# Patient Record
Sex: Male | Born: 1940
Health system: Southern US, Community
[De-identification: ages and names within clinical notes are randomized; demographics above are authoritative.]

## PROBLEM LIST (undated history)

## (undated) DIAGNOSIS — I480 Paroxysmal atrial fibrillation: Secondary | ICD-10-CM

## (undated) DIAGNOSIS — R0989 Other specified symptoms and signs involving the circulatory and respiratory systems: Secondary | ICD-10-CM

## (undated) DIAGNOSIS — R059 Cough, unspecified: Secondary | ICD-10-CM

## (undated) DIAGNOSIS — K219 Gastro-esophageal reflux disease without esophagitis: Secondary | ICD-10-CM

## (undated) DIAGNOSIS — Z95810 Presence of automatic (implantable) cardiac defibrillator: Secondary | ICD-10-CM

## (undated) DIAGNOSIS — I5022 Chronic systolic (congestive) heart failure: Secondary | ICD-10-CM

## (undated) DIAGNOSIS — R05 Cough: Secondary | ICD-10-CM

## (undated) DIAGNOSIS — I5020 Unspecified systolic (congestive) heart failure: Secondary | ICD-10-CM

## (undated) DIAGNOSIS — H3530 Unspecified macular degeneration: Secondary | ICD-10-CM

## (undated) DIAGNOSIS — I513 Intracardiac thrombosis, not elsewhere classified: Secondary | ICD-10-CM

## (undated) DIAGNOSIS — I2510 Atherosclerotic heart disease of native coronary artery without angina pectoris: Secondary | ICD-10-CM

## (undated) DIAGNOSIS — E785 Hyperlipidemia, unspecified: Secondary | ICD-10-CM

## (undated) DIAGNOSIS — D649 Anemia, unspecified: Secondary | ICD-10-CM

## (undated) DIAGNOSIS — C187 Malignant neoplasm of sigmoid colon: Secondary | ICD-10-CM

## (undated) DIAGNOSIS — I10 Essential (primary) hypertension: Secondary | ICD-10-CM

## (undated) DIAGNOSIS — I219 Acute myocardial infarction, unspecified: Secondary | ICD-10-CM

## (undated) DIAGNOSIS — M1990 Unspecified osteoarthritis, unspecified site: Secondary | ICD-10-CM

## (undated) DIAGNOSIS — I255 Ischemic cardiomyopathy: Secondary | ICD-10-CM

## (undated) HISTORY — DX: Chronic systolic (congestive) heart failure: I50.22

## (undated) HISTORY — DX: Unspecified macular degeneration: H35.30

## (undated) HISTORY — DX: Paroxysmal atrial fibrillation: I48.0

## (undated) HISTORY — PX: COLON SURGERY: SHX602

## (undated) HISTORY — DX: Other specified symptoms and signs involving the circulatory and respiratory systems: R09.89

## (undated) HISTORY — DX: Gastro-esophageal reflux disease without esophagitis: K21.9

## (undated) HISTORY — PX: INGUINAL HERNIA REPAIR: SUR1180

## (undated) HISTORY — DX: Anemia, unspecified: D64.9

## (undated) HISTORY — DX: Hyperlipidemia, unspecified: E78.5

## (undated) HISTORY — PX: CORONARY ANGIOPLASTY: SHX604

## (undated) HISTORY — DX: Acute myocardial infarction, unspecified: I21.9

## (undated) HISTORY — PX: TONSILLECTOMY: SUR1361

## (undated) HISTORY — DX: Unspecified osteoarthritis, unspecified site: M19.90

## (undated) HISTORY — PX: CATARACT EXTRACTION W/ INTRAOCULAR LENS  IMPLANT, BILATERAL: SHX1307

## (undated) HISTORY — DX: Cough, unspecified: R05.9

## (undated) HISTORY — DX: Atherosclerotic heart disease of native coronary artery without angina pectoris: I25.10

## (undated) HISTORY — DX: Ischemic cardiomyopathy: I25.5

## (undated) HISTORY — DX: Intracardiac thrombosis, not elsewhere classified: I51.3

## (undated) HISTORY — DX: Cough: R05

## (undated) HISTORY — DX: Essential (primary) hypertension: I10

---

## 1999-05-13 ENCOUNTER — Ambulatory Visit (HOSPITAL_BASED_OUTPATIENT_CLINIC_OR_DEPARTMENT_OTHER): Admission: RE | Admit: 1999-05-13 | Discharge: 1999-05-14 | Payer: Self-pay | Admitting: Surgery

## 1999-05-13 ENCOUNTER — Encounter (INDEPENDENT_AMBULATORY_CARE_PROVIDER_SITE_OTHER): Payer: Self-pay | Admitting: Specialist

## 1999-05-15 ENCOUNTER — Encounter: Payer: Self-pay | Admitting: Surgery

## 1999-05-15 ENCOUNTER — Encounter: Admission: RE | Admit: 1999-05-15 | Discharge: 1999-05-15 | Payer: Self-pay | Admitting: Surgery

## 2001-03-30 DIAGNOSIS — I2510 Atherosclerotic heart disease of native coronary artery without angina pectoris: Secondary | ICD-10-CM

## 2001-03-30 HISTORY — DX: Atherosclerotic heart disease of native coronary artery without angina pectoris: I25.10

## 2002-01-28 HISTORY — PX: CORONARY ARTERY BYPASS GRAFT: SHX141

## 2002-02-21 ENCOUNTER — Encounter: Payer: Self-pay | Admitting: Surgery

## 2002-02-21 ENCOUNTER — Inpatient Hospital Stay (HOSPITAL_COMMUNITY): Admission: EM | Admit: 2002-02-21 | Discharge: 2002-02-26 | Payer: Self-pay

## 2002-02-21 ENCOUNTER — Encounter: Payer: Self-pay | Admitting: *Deleted

## 2002-02-22 ENCOUNTER — Encounter: Payer: Self-pay | Admitting: Surgery

## 2002-02-23 ENCOUNTER — Encounter: Payer: Self-pay | Admitting: Surgery

## 2002-02-24 ENCOUNTER — Encounter: Payer: Self-pay | Admitting: Cardiothoracic Surgery

## 2003-01-17 ENCOUNTER — Encounter: Payer: Self-pay | Admitting: Internal Medicine

## 2003-01-17 ENCOUNTER — Ambulatory Visit (HOSPITAL_COMMUNITY): Admission: RE | Admit: 2003-01-17 | Discharge: 2003-01-18 | Payer: Self-pay | Admitting: Internal Medicine

## 2003-01-17 DIAGNOSIS — Z95810 Presence of automatic (implantable) cardiac defibrillator: Secondary | ICD-10-CM

## 2003-01-17 HISTORY — PX: CARDIAC DEFIBRILLATOR PLACEMENT: SHX171

## 2003-01-17 HISTORY — DX: Presence of automatic (implantable) cardiac defibrillator: Z95.810

## 2003-01-18 ENCOUNTER — Encounter: Payer: Self-pay | Admitting: Internal Medicine

## 2004-03-10 ENCOUNTER — Ambulatory Visit: Payer: Self-pay | Admitting: Cardiology

## 2004-03-12 ENCOUNTER — Ambulatory Visit: Payer: Self-pay | Admitting: Internal Medicine

## 2004-08-20 ENCOUNTER — Ambulatory Visit: Payer: Self-pay | Admitting: Internal Medicine

## 2004-12-22 ENCOUNTER — Ambulatory Visit: Payer: Self-pay | Admitting: Cardiology

## 2005-02-09 ENCOUNTER — Ambulatory Visit: Payer: Self-pay | Admitting: Cardiology

## 2005-04-20 ENCOUNTER — Ambulatory Visit: Payer: Self-pay | Admitting: Internal Medicine

## 2005-08-26 ENCOUNTER — Ambulatory Visit: Payer: Self-pay | Admitting: Internal Medicine

## 2005-10-05 ENCOUNTER — Ambulatory Visit: Payer: Self-pay | Admitting: Cardiology

## 2006-01-29 ENCOUNTER — Ambulatory Visit: Payer: Self-pay

## 2006-02-22 ENCOUNTER — Ambulatory Visit: Payer: Self-pay | Admitting: Cardiology

## 2006-05-14 ENCOUNTER — Ambulatory Visit: Payer: Self-pay | Admitting: Internal Medicine

## 2006-07-30 ENCOUNTER — Ambulatory Visit: Payer: Self-pay | Admitting: Cardiology

## 2006-07-30 LAB — CONVERTED CEMR LAB
ALT: 12 units/L (ref 0–40)
AST: 17 units/L (ref 0–37)
Albumin: 3.4 g/dL — ABNORMAL LOW (ref 3.5–5.2)
Alkaline Phosphatase: 40 units/L (ref 39–117)
Bilirubin, Direct: 0.1 mg/dL (ref 0.0–0.3)
Cholesterol: 155 mg/dL (ref 0–200)
HDL: 44.5 mg/dL (ref >39.0)
LDL Cholesterol: 84 mg/dL (ref 0–99)
Total Bilirubin: 0.8 mg/dL (ref 0.3–1.2)
Total CHOL/HDL Ratio: 3.5
Total Protein: 5.9 g/dL — ABNORMAL LOW (ref 6.0–8.3)
Triglycerides: 134 mg/dL (ref 0–149)
VLDL: 27 mg/dL (ref 0–40)

## 2006-08-02 ENCOUNTER — Ambulatory Visit: Payer: Self-pay | Admitting: Internal Medicine

## 2006-09-02 ENCOUNTER — Encounter: Payer: Self-pay | Admitting: Internal Medicine

## 2006-09-02 ENCOUNTER — Ambulatory Visit: Payer: Self-pay | Admitting: Cardiology

## 2006-09-02 LAB — CONVERTED CEMR LAB
BUN: 8 mg/dL (ref 6–23)
CO2: 28 meq/L (ref 19–32)
Calcium: 9 mg/dL (ref 8.4–10.5)
Chloride: 111 meq/L (ref 96–112)
Creatinine, Ser: 0.8 mg/dL (ref 0.4–1.5)
GFR calc Af Amer: 125 mL/min
GFR calc non Af Amer: 103 mL/min
Glucose, Bld: 86 mg/dL (ref 70–99)
Potassium: 4.2 meq/L (ref 3.5–5.1)
Sodium: 143 meq/L (ref 135–145)

## 2006-09-10 ENCOUNTER — Ambulatory Visit: Payer: Self-pay

## 2006-09-10 LAB — CONVERTED CEMR LAB
BUN: 14 mg/dL (ref 6–23)
CO2: 31 meq/L (ref 19–32)
Calcium: 9.5 mg/dL (ref 8.4–10.5)
Chloride: 109 meq/L (ref 96–112)
Creatinine, Ser: 1 mg/dL (ref 0.4–1.5)
GFR calc Af Amer: 96 mL/min
GFR calc non Af Amer: 80 mL/min
Glucose, Bld: 135 mg/dL — ABNORMAL HIGH (ref 70–99)
Potassium: 5.5 meq/L — ABNORMAL HIGH (ref 3.5–5.1)
Sodium: 143 meq/L (ref 135–145)

## 2006-09-24 ENCOUNTER — Ambulatory Visit: Payer: Self-pay | Admitting: Cardiology

## 2006-09-24 LAB — CONVERTED CEMR LAB
BUN: 10 mg/dL (ref 6–23)
CO2: 32 meq/L (ref 19–32)
Calcium: 9.2 mg/dL (ref 8.4–10.5)
Chloride: 111 meq/L (ref 96–112)
Creatinine, Ser: 1 mg/dL (ref 0.4–1.5)
GFR calc Af Amer: 96 mL/min
GFR calc non Af Amer: 80 mL/min
Glucose, Bld: 89 mg/dL (ref 70–99)
Potassium: 4.8 meq/L (ref 3.5–5.1)
Sodium: 144 meq/L (ref 135–145)

## 2006-10-29 ENCOUNTER — Ambulatory Visit: Payer: Self-pay | Admitting: Internal Medicine

## 2006-11-09 ENCOUNTER — Ambulatory Visit: Payer: Self-pay | Admitting: Internal Medicine

## 2006-12-10 ENCOUNTER — Ambulatory Visit: Payer: Self-pay | Admitting: Internal Medicine

## 2006-12-13 ENCOUNTER — Ambulatory Visit: Payer: Self-pay

## 2006-12-20 ENCOUNTER — Ambulatory Visit: Payer: Self-pay | Admitting: Internal Medicine

## 2006-12-20 LAB — CONVERTED CEMR LAB
ALT: 14 units/L (ref 0–53)
AST: 18 units/L (ref 0–37)
Albumin: 3.5 g/dL (ref 3.5–5.2)
Alkaline Phosphatase: 43 units/L (ref 39–117)
BUN: 8 mg/dL (ref 6–23)
Bilirubin, Direct: 0.1 mg/dL (ref 0.0–0.3)
CO2: 30 meq/L (ref 19–32)
Calcium: 8.8 mg/dL (ref 8.4–10.5)
Chloride: 110 meq/L (ref 96–112)
Cholesterol: 174 mg/dL (ref 0–200)
Creatinine, Ser: 0.9 mg/dL (ref 0.4–1.5)
GFR calc Af Amer: 109 mL/min
GFR calc non Af Amer: 90 mL/min
Glucose, Bld: 126 mg/dL — ABNORMAL HIGH (ref 70–99)
HDL: 45.9 mg/dL (ref >39.0)
LDL Cholesterol: 108 mg/dL — ABNORMAL HIGH (ref 0–99)
Potassium: 4 meq/L (ref 3.5–5.1)
Sodium: 144 meq/L (ref 135–145)
Total Bilirubin: 0.8 mg/dL (ref 0.3–1.2)
Total CHOL/HDL Ratio: 3.8
Total Protein: 6.3 g/dL (ref 6.0–8.3)
Triglycerides: 100 mg/dL (ref 0–149)
VLDL: 20 mg/dL (ref 0–40)

## 2007-02-28 ENCOUNTER — Ambulatory Visit: Payer: Self-pay | Admitting: Internal Medicine

## 2007-03-01 ENCOUNTER — Encounter: Payer: Self-pay | Admitting: Internal Medicine

## 2007-03-01 LAB — CONVERTED CEMR LAB
ALT: 12 units/L (ref 0–53)
AST: 18 units/L (ref 0–37)
Albumin: 4.3 g/dL (ref 3.5–5.2)
Alkaline Phosphatase: 46 units/L (ref 39–117)
BUN: 11 mg/dL (ref 6–23)
CO2: 20 meq/L (ref 19–32)
Calcium: 9.3 mg/dL (ref 8.4–10.5)
Chloride: 103 meq/L (ref 96–112)
Cholesterol: 220 mg/dL — ABNORMAL HIGH (ref 0–200)
Creatinine, Ser: 0.99 mg/dL (ref 0.40–1.50)
Glucose, Bld: 67 mg/dL — ABNORMAL LOW (ref 70–99)
HDL: 56 mg/dL (ref >39)
LDL Cholesterol: 132 mg/dL — ABNORMAL HIGH (ref 0–99)
Potassium: 4.4 meq/L (ref 3.5–5.3)
Sodium: 142 meq/L (ref 135–145)
Total Bilirubin: 0.6 mg/dL (ref 0.3–1.2)
Total CHOL/HDL Ratio: 3.9
Total Protein: 7 g/dL (ref 6.0–8.3)
Triglycerides: 159 mg/dL — ABNORMAL HIGH (ref <150)
VLDL: 32 mg/dL (ref 0–40)

## 2007-03-04 ENCOUNTER — Ambulatory Visit: Payer: Self-pay | Admitting: Internal Medicine

## 2007-03-11 ENCOUNTER — Ambulatory Visit: Payer: Self-pay | Admitting: Internal Medicine

## 2007-03-11 LAB — CONVERTED CEMR LAB
BUN: 10 mg/dL (ref 6–23)
CO2: 26 meq/L (ref 19–32)
Calcium: 8.4 mg/dL (ref 8.4–10.5)
Chloride: 103 meq/L (ref 96–112)
Creatinine, Ser: 0.95 mg/dL (ref 0.40–1.50)
Glucose, Bld: 204 mg/dL — ABNORMAL HIGH (ref 70–99)
Potassium: 4.6 meq/L (ref 3.5–5.3)
Sodium: 138 meq/L (ref 135–145)

## 2007-03-21 ENCOUNTER — Ambulatory Visit: Payer: Self-pay

## 2007-06-06 ENCOUNTER — Ambulatory Visit: Payer: Self-pay

## 2007-06-06 ENCOUNTER — Encounter: Payer: Self-pay | Admitting: Internal Medicine

## 2007-06-06 LAB — CONVERTED CEMR LAB
ALT: 11 units/L (ref 0–53)
AST: 20 units/L (ref 0–37)
Albumin: 4.2 g/dL (ref 3.5–5.2)
Alkaline Phosphatase: 49 units/L (ref 39–117)
BUN: 15 mg/dL (ref 6–23)
CO2: 25 meq/L (ref 19–32)
Calcium: 8.8 mg/dL (ref 8.4–10.5)
Chloride: 108 meq/L (ref 96–112)
Cholesterol: 169 mg/dL (ref 0–200)
Creatinine, Ser: 0.98 mg/dL (ref 0.40–1.50)
Glucose, Bld: 111 mg/dL — ABNORMAL HIGH (ref 70–99)
HDL: 55 mg/dL (ref >39)
LDL Cholesterol: 89 mg/dL (ref 0–99)
Potassium: 4.2 meq/L (ref 3.5–5.3)
Sodium: 143 meq/L (ref 135–145)
Total Bilirubin: 0.7 mg/dL (ref 0.3–1.2)
Total CHOL/HDL Ratio: 3.1
Total Protein: 6.6 g/dL (ref 6.0–8.3)
Triglycerides: 127 mg/dL (ref <150)
VLDL: 25 mg/dL (ref 0–40)

## 2007-06-13 ENCOUNTER — Ambulatory Visit: Payer: Self-pay | Admitting: Internal Medicine

## 2007-09-05 ENCOUNTER — Encounter: Payer: Self-pay | Admitting: Internal Medicine

## 2007-09-05 ENCOUNTER — Ambulatory Visit: Payer: Self-pay | Admitting: Internal Medicine

## 2007-09-05 LAB — CONVERTED CEMR LAB
ALT: 12 units/L (ref 0–53)
AST: 16 units/L (ref 0–37)
Albumin: 4 g/dL (ref 3.5–5.2)
Alkaline Phosphatase: 47 units/L (ref 39–117)
Bilirubin, Direct: 0.1 mg/dL (ref 0.0–0.3)
Cholesterol: 154 mg/dL (ref 0–200)
HDL: 57 mg/dL (ref >39)
Indirect Bilirubin: 0.6 mg/dL (ref 0.0–0.9)
LDL Cholesterol: 79 mg/dL (ref 0–99)
Total Bilirubin: 0.7 mg/dL (ref 0.3–1.2)
Total CHOL/HDL Ratio: 2.7
Total Protein: 6.6 g/dL (ref 6.0–8.3)
Triglycerides: 91 mg/dL (ref <150)
VLDL: 18 mg/dL (ref 0–40)

## 2007-10-28 ENCOUNTER — Ambulatory Visit: Payer: Self-pay | Admitting: Internal Medicine

## 2007-10-31 ENCOUNTER — Ambulatory Visit: Payer: Self-pay

## 2007-10-31 ENCOUNTER — Encounter: Payer: Self-pay | Admitting: Internal Medicine

## 2007-11-18 ENCOUNTER — Ambulatory Visit: Payer: Self-pay | Admitting: Cardiovascular Disease

## 2007-11-28 ENCOUNTER — Encounter: Payer: Self-pay | Admitting: Internal Medicine

## 2007-11-28 ENCOUNTER — Ambulatory Visit: Payer: Self-pay | Admitting: Internal Medicine

## 2007-11-28 LAB — CONVERTED CEMR LAB
HCT: 39 % (ref 39.0–52.0)
Hemoglobin: 12.9 g/dL — ABNORMAL LOW (ref 13.0–17.0)
MCHC: 33.1 g/dL (ref 30.0–36.0)
MCV: 91.1 fL (ref 78.0–100.0)
Platelets: 183 10*3/uL (ref 150–400)
RBC: 4.28 M/uL (ref 4.22–5.81)
RDW: 13.3 % (ref 11.5–15.5)
WBC: 5.6 10*3/uL (ref 4.0–10.5)

## 2007-12-02 ENCOUNTER — Ambulatory Visit: Payer: Self-pay | Admitting: Cardiology

## 2007-12-22 ENCOUNTER — Ambulatory Visit: Payer: Self-pay | Admitting: Cardiology

## 2008-02-08 ENCOUNTER — Ambulatory Visit: Payer: Self-pay

## 2008-02-08 ENCOUNTER — Encounter: Payer: Self-pay | Admitting: Internal Medicine

## 2008-03-19 ENCOUNTER — Ambulatory Visit: Payer: Self-pay | Admitting: Internal Medicine

## 2008-03-25 ENCOUNTER — Emergency Department (HOSPITAL_COMMUNITY): Admission: EM | Admit: 2008-03-25 | Discharge: 2008-03-25 | Payer: Self-pay | Admitting: Internal Medicine

## 2008-03-30 HISTORY — PX: CATARACT EXTRACTION W/PHACO: SHX586

## 2008-03-30 HISTORY — PX: ICD GENERATOR CHANGE: SHX5854

## 2008-05-07 ENCOUNTER — Ambulatory Visit: Payer: Self-pay | Admitting: Internal Medicine

## 2008-05-07 ENCOUNTER — Encounter: Payer: Self-pay | Admitting: Internal Medicine

## 2008-05-07 LAB — CONVERTED CEMR LAB
ALT: 10 units/L (ref 0–53)
AST: 17 units/L (ref 0–37)
Albumin: 3.8 g/dL (ref 3.5–5.2)
Alkaline Phosphatase: 50 units/L (ref 39–117)
BUN: 12 mg/dL (ref 6–23)
CO2: 21 meq/L (ref 19–32)
Calcium: 8.7 mg/dL (ref 8.4–10.5)
Chloride: 108 meq/L (ref 96–112)
Cholesterol: 142 mg/dL (ref 0–200)
Creatinine, Ser: 0.86 mg/dL (ref 0.40–1.50)
Glucose, Bld: 111 mg/dL — ABNORMAL HIGH (ref 70–99)
HDL: 54 mg/dL (ref >39)
LDL Cholesterol: 72 mg/dL (ref 0–99)
Potassium: 4.5 meq/L (ref 3.5–5.3)
Sodium: 140 meq/L (ref 135–145)
Total Bilirubin: 0.6 mg/dL (ref 0.3–1.2)
Total CHOL/HDL Ratio: 2.6
Total Protein: 6.1 g/dL (ref 6.0–8.3)
Triglycerides: 78 mg/dL (ref <150)
VLDL: 16 mg/dL (ref 0–40)

## 2008-06-18 ENCOUNTER — Ambulatory Visit: Payer: Self-pay | Admitting: Internal Medicine

## 2008-08-13 ENCOUNTER — Ambulatory Visit: Payer: Self-pay | Admitting: Internal Medicine

## 2008-08-13 ENCOUNTER — Encounter: Payer: Self-pay | Admitting: Internal Medicine

## 2008-08-22 LAB — CONVERTED CEMR LAB
ALT: 12 units/L (ref 0–53)
AST: 19 units/L (ref 0–37)
Albumin: 3.8 g/dL (ref 3.5–5.2)
Alkaline Phosphatase: 60 units/L (ref 39–117)
Bilirubin, Direct: 0.1 mg/dL (ref 0.0–0.3)
Cholesterol: 168 mg/dL (ref 0–200)
HDL: 60 mg/dL (ref >39)
Indirect Bilirubin: 0.5 mg/dL (ref 0.0–0.9)
LDL Cholesterol: 90 mg/dL (ref 0–99)
Total Bilirubin: 0.6 mg/dL (ref 0.3–1.2)
Total CHOL/HDL Ratio: 2.8
Total Protein: 6.8 g/dL (ref 6.0–8.3)
Triglycerides: 91 mg/dL (ref <150)
VLDL: 18 mg/dL (ref 0–40)

## 2008-11-26 ENCOUNTER — Ambulatory Visit: Payer: Self-pay | Admitting: Internal Medicine

## 2008-11-26 ENCOUNTER — Encounter: Payer: Self-pay | Admitting: Internal Medicine

## 2008-11-26 DIAGNOSIS — I5022 Chronic systolic (congestive) heart failure: Secondary | ICD-10-CM | POA: Insufficient documentation

## 2008-11-27 ENCOUNTER — Telehealth (INDEPENDENT_AMBULATORY_CARE_PROVIDER_SITE_OTHER): Payer: Self-pay | Admitting: *Deleted

## 2008-11-28 ENCOUNTER — Ambulatory Visit: Payer: Self-pay

## 2008-11-28 ENCOUNTER — Encounter: Payer: Self-pay | Admitting: Cardiology

## 2008-12-06 ENCOUNTER — Ambulatory Visit: Payer: Self-pay | Admitting: Internal Medicine

## 2008-12-06 DIAGNOSIS — E785 Hyperlipidemia, unspecified: Secondary | ICD-10-CM | POA: Insufficient documentation

## 2008-12-07 LAB — CONVERTED CEMR LAB
ALT: 11 units/L (ref 0–53)
AST: 17 units/L (ref 0–37)
Albumin: 3.9 g/dL (ref 3.5–5.2)
Alkaline Phosphatase: 49 units/L (ref 39–117)
BUN: 11 mg/dL (ref 6–23)
CO2: 24 meq/L (ref 19–32)
Calcium: 8.8 mg/dL (ref 8.4–10.5)
Chloride: 108 meq/L (ref 96–112)
Cholesterol: 160 mg/dL (ref 0–200)
Creatinine, Ser: 0.89 mg/dL (ref 0.40–1.50)
Glucose, Bld: 109 mg/dL — ABNORMAL HIGH (ref 70–99)
HCT: 34.3 % — ABNORMAL LOW (ref 39.0–52.0)
HDL: 54 mg/dL (ref >39)
Hemoglobin: 10.3 g/dL — ABNORMAL LOW (ref 13.0–17.0)
INR: 1 (ref 0.0–1.5)
LDL Cholesterol: 87 mg/dL (ref 0–99)
MCHC: 30 g/dL (ref 30.0–36.0)
MCV: 80 fL (ref 78.0–100.0)
Platelets: 215 10*3/uL (ref 150–400)
Potassium: 4.1 meq/L (ref 3.5–5.3)
Prothrombin Time: 13.3 s (ref 11.6–15.2)
RBC: 4.29 M/uL (ref 4.22–5.81)
RDW: 16 % — ABNORMAL HIGH (ref 11.5–15.5)
Sodium: 142 meq/L (ref 135–145)
Total Bilirubin: 0.6 mg/dL (ref 0.3–1.2)
Total CHOL/HDL Ratio: 3
Total Protein: 6.5 g/dL (ref 6.0–8.3)
Triglycerides: 94 mg/dL (ref <150)
VLDL: 19 mg/dL (ref 0–40)
WBC: 4.9 10*3/uL (ref 4.0–10.5)
aPTT: 28 s (ref 24–37)

## 2008-12-10 ENCOUNTER — Inpatient Hospital Stay (HOSPITAL_COMMUNITY): Admission: RE | Admit: 2008-12-10 | Discharge: 2008-12-12 | Payer: Self-pay | Admitting: Internal Medicine

## 2008-12-10 ENCOUNTER — Ambulatory Visit: Payer: Self-pay | Admitting: Internal Medicine

## 2008-12-11 ENCOUNTER — Encounter: Payer: Self-pay | Admitting: Internal Medicine

## 2008-12-13 ENCOUNTER — Telehealth (INDEPENDENT_AMBULATORY_CARE_PROVIDER_SITE_OTHER): Payer: Self-pay | Admitting: *Deleted

## 2008-12-13 ENCOUNTER — Encounter (INDEPENDENT_AMBULATORY_CARE_PROVIDER_SITE_OTHER): Payer: Self-pay | Admitting: *Deleted

## 2008-12-17 ENCOUNTER — Telehealth: Payer: Self-pay | Admitting: Internal Medicine

## 2008-12-17 ENCOUNTER — Telehealth (INDEPENDENT_AMBULATORY_CARE_PROVIDER_SITE_OTHER): Payer: Self-pay | Admitting: *Deleted

## 2008-12-24 ENCOUNTER — Ambulatory Visit: Payer: Self-pay

## 2008-12-24 ENCOUNTER — Encounter: Payer: Self-pay | Admitting: Internal Medicine

## 2008-12-31 ENCOUNTER — Telehealth: Payer: Self-pay | Admitting: Internal Medicine

## 2009-04-29 ENCOUNTER — Ambulatory Visit: Payer: Self-pay | Admitting: Internal Medicine

## 2009-04-29 DIAGNOSIS — E860 Dehydration: Secondary | ICD-10-CM | POA: Insufficient documentation

## 2009-04-29 DIAGNOSIS — I359 Nonrheumatic aortic valve disorder, unspecified: Secondary | ICD-10-CM | POA: Insufficient documentation

## 2009-05-09 ENCOUNTER — Telehealth: Payer: Self-pay | Admitting: Internal Medicine

## 2009-05-10 ENCOUNTER — Ambulatory Visit: Payer: Self-pay | Admitting: Internal Medicine

## 2009-05-10 ENCOUNTER — Telehealth: Payer: Self-pay | Admitting: Internal Medicine

## 2009-05-10 ENCOUNTER — Encounter: Payer: Self-pay | Admitting: Internal Medicine

## 2009-05-10 DIAGNOSIS — R252 Cramp and spasm: Secondary | ICD-10-CM | POA: Insufficient documentation

## 2009-05-11 LAB — CONVERTED CEMR LAB
BUN: 9 mg/dL (ref 6–23)
CO2: 24 meq/L (ref 19–32)
Calcium: 8.8 mg/dL (ref 8.4–10.5)
Chloride: 107 meq/L (ref 96–112)
Creatinine, Ser: 0.9 mg/dL (ref 0.40–1.50)
Glucose, Bld: 97 mg/dL (ref 70–99)
Potassium: 4.2 meq/L (ref 3.5–5.3)
Sodium: 140 meq/L (ref 135–145)

## 2009-05-13 ENCOUNTER — Telehealth: Payer: Self-pay | Admitting: Internal Medicine

## 2009-08-29 ENCOUNTER — Ambulatory Visit (HOSPITAL_COMMUNITY): Admission: RE | Admit: 2009-08-29 | Discharge: 2009-08-29 | Payer: Self-pay | Admitting: General Surgery

## 2009-10-04 ENCOUNTER — Ambulatory Visit: Payer: Self-pay | Admitting: Internal Medicine

## 2009-11-01 ENCOUNTER — Encounter: Admission: RE | Admit: 2009-11-01 | Discharge: 2009-11-01 | Payer: Self-pay | Admitting: General Surgery

## 2010-01-20 ENCOUNTER — Encounter: Payer: Self-pay | Admitting: Internal Medicine

## 2010-01-20 ENCOUNTER — Ambulatory Visit: Payer: Self-pay | Admitting: Internal Medicine

## 2010-01-27 ENCOUNTER — Telehealth: Payer: Self-pay | Admitting: Internal Medicine

## 2010-02-04 ENCOUNTER — Encounter: Payer: Self-pay | Admitting: Internal Medicine

## 2010-02-04 ENCOUNTER — Ambulatory Visit: Payer: Self-pay

## 2010-03-30 DIAGNOSIS — C187 Malignant neoplasm of sigmoid colon: Secondary | ICD-10-CM

## 2010-03-30 HISTORY — DX: Malignant neoplasm of sigmoid colon: C18.7

## 2010-04-29 NOTE — Progress Notes (Addendum)
Summary: MEDICATION PROBLEMS  Phone Note Call from Patient Call back at Home Phone [phone]   Caller: SELF Call For: Mark Clayton Summary of Call: PT CANNOT TOLERATE AVAPRO-COUGHING AND PAIN IN LEGS AND ARMS Initial call taken by: Harlon Flor,  January 27, 2010 9:21 AM  Follow-up for Phone Call        [facility] TCB Benedict Needy, RN  January 27, 2010 4:05 PM   Pt did not tolerate avapro.  Follow-up by: Benedict Needy, RN,  January 28, 2010 10:18 AM

## 2010-04-29 NOTE — Assessment & Plan Note (Signed)
Summary: F6M/PACER CHECK/AMD   Visit Type:  Follow-up Primary Provider:  Dr. Jonny Ruiz  CC:  no cardiac complaints.  History of Present Illness: [name] is seen in followup for ischemic heart disease. He is status post ICD implantation for primary prevention with 6949-lead. He has reached ERI.  He denies problems with chest pain. He does have chronic mild shortness of breath. There has been no peripheral edema.  Current Medications (verified): 1)  Furosemide 20 Mg Tabs (Furosemide) .... Take One Tablet By Mouth Daily. 2)  Carvedilol 25 Mg Tabs (Carvedilol) .... One Half By Mouth Two Times A Day 3)  Omeprazole 20 Mg Cpdr (Omeprazole) .... One By Mouth Daily 4)  Aspirin 81 Mg Tbec (Aspirin) .... One By Mouth Daily 5)  Cozaar 50 Mg Tabs (Losartan Potassium) .... Take 1 Tablet By Mouth Once A Day 6)  Magnesium Oxide 400 Mg Tabs (Magnesium Oxide) .... Take 1 Tablet As Needed 7)  Nitroglycerin 0.4 Mg Subl (Nitroglycerin) .... Place 1 Tablet Under Tongue As Directed 8)  Proamatine 2.5 Mg Tabs (Midodrine Hcl) .... Take 1 Tablet Once A Day As Needed 9)  Promethazine Hcl 25 Mg Tabs (Promethazine Hcl) .... Take 1 Tablet By Mouth Once A Day 10)  Zocor 80 Mg Tabs (Simvastatin) .... Take 1 Tablet By Mouth Once A Day  Allergies (verified): No Known Drug Allergies  Vital Signs:  Patient profile:   70 year old male Height:      70 inches Weight:      183 pounds BMI:     26.35 Pulse rate:   74 / minute Pulse rhythm:   regular BP sitting:   140 / 68  (right arm) Cuff size:   large  Vitals Entered By: Mercer Pod (November 26, 2008 10:26 AM)  Physical Exam  General:  Alert and oriented in no acute distress. HEENT exam no xanthelasma and normocephalic. Neck veins were flat; carotids brisk and full without bruits. No lymphadenopathy. Back without kyphosis. Lungs clear. Heart sounds regular without murmurs or gallops. PMI nondisplaced. Abdomen soft with active bowel sounds without midline  pulsation or hepatomegaly. Femoral pulses and distal pulses intact. Extremities were without clubbing cyanosis or edemaSkin warm and dry. Neurological exam grossly normal     ICD Specifications ICD Vendor:  Medtronic     ICD Model Number:  7232     ICD Serial Number:  [id number] ICD DOI:  01/17/2003     ICD Implanting MD:  Sherryl Manges, MD  Lead 1:    Location: RV     DOI: 01/17/2003     Model #: [id number]     Serial #: [id number]     Status: active  Indications::  ICM   ICD Follow Up ICD Dependent:  No      Episodes Coumadin:  No  Brady Parameters Mode VVI     Lower Rate Limit:  40      Tachy Zones VF:  188     VT1:  162     Impression & Recommendations:  Problem # 1:  IMPLANTABLE DEFIBRILLATOR  MDT (ICD-V45.02)  Mark Clayton ICD has reached ERI. He will need to undergo generator replacement. We have discussed potential benefits as well as potential risks including but not limited to infection and death. He understands these risks and is willing to proceed. As noted below he also has a 6949-lead in place. Because of that at the time of generator replacement we will place a new rate sensing lead.  He  will need a venogram to identify patency of the left-sided vein. Alternatively he may need a right-sided procedure. In the event that his left side is occluded I may well choose to do nothing based on the recent Med[employer] data which are a little bit less concerning than the  HRS data presented in May  Orders: Bi-V ICD (Bi-V ICD)  Problem # 2:  MECH COMP DUE AUTO IMPLANTABLE CARD DEFIB 6949 LEAD (ICD-996.04) as above  Problem # 3:  CONGESTIVE HEART FAILURE, SYSTOLIC, CHRONIC (ICD-428.0) Is currently stable on his current medications with resolution of his cough attributable to his ACE inhibitor  Problem # 4:  CARDIOMYOPATHY, ISCHEMIC S/P CABG EF 25% (ICD-414.8)  You will need pre-implantation Myoview scanning since it has been years since his anatomy was visualized.  Orders: Nuclear  Stress Test (Nuc Stress Test)

## 2010-04-29 NOTE — Assessment & Plan Note (Signed)
Summary: F/U 3 MONTHS   Visit Type:  Follow-up Primary Provider:  Dr. Jonny Ruiz   History of Present Illness: [name] is seen in followup for ischemic heart disease. He is status post ICD implantation for primary prevention with 6949-lead. He recently underwent device generator replacement with insertion of a new right ventricular pace sense lead.  He denies problems with chest pain. He does have chronic mild shortness of breath.  He feels great. There has been no peripheral edema.  his last echo from fall 2009 and was treated mild  aortic stenosis w mean gradient 10mm  Myoview 2010  EF:  33 %  Septal and anterior hypokinesis to akinesis.     Current Medications (verified): 1)  Furosemide 20 Mg Tabs (Furosemide) .... Take One Tablet By Mouth Daily. 2)  Carvedilol 6.25 Mg Tabs (Carvedilol) .... Take One Tablet By Mouth in The Am and 25mg  At Night 3)  Omeprazole 20 Mg Cpdr (Omeprazole) .... One By Mouth Daily 4)  Aspirin 81 Mg Tbec (Aspirin) .... One By Mouth Daily 5)  Nitroglycerin 0.4 Mg Subl (Nitroglycerin) .... Place 1 Tablet Under Tongue As Directed 6)  Promethazine Hcl 25 Mg Tabs (Promethazine Hcl) .... Take 1 Tablet By Mouth Once A Day As Needed 7)  Zocor 80 Mg Tabs (Simvastatin) .... Take 1 Tablet By Mouth Once A Day  Allergies (verified): 1)  ! Crestor  Past History:  Past Medical History: Last updated: 05/08/2008 1. Ischemic cardiomyopathy with;       a.     Prior bypass.       b.     Ejection fraction of 25%.   2. Orthostatic intolerance.   3. Cough, question related to lisinopril versus infection.   4. Systolic hypertension.   5. Dyslipidemia.   6. Status post implantable cardioverter-defibrillator implantation       with 6949 lead.   Vital Signs:  Patient profile:   70 year old male Height:      70 inches Weight:      178 pounds BMI:     25.63 Pulse rate:   60 / minute BP sitting:   132 / 62  (left arm) Cuff size:   regular  Vitals Entered By: Bishop Dublin, CMA (January 20, 2010 9:52 AM)  Physical Exam  General:  The patient was alert and oriented in no acute distress. HEENT Normal.  Neck veins were flat, carotids were brisk.  Lungs were clear.  Heart sounds were regular without murmurs or gallops.  Abdomen was soft with active bowel sounds. There is no clubbing cyanosis or edema. Skin Warm and dry     ICD Specifications Following MD:  Sherryl Manges, MD     ICD Vendor:  Medtronic     ICD Model Number:  D274VRC     ICD Serial Number:  [id number] ICD DOI:  12/10/2008     ICD Implanting MD:  Sherryl Manges, MD  Lead 1:    Location: RV     DOI: 01/17/2003     Model #: [id number]     Serial #: [id number]     Status: active Lead 2:    Location: RV     DOI: 12/10/2008     Model #: [id number]     Serial #: [id number]     Status: active  Indications::  ICM  Explantation Comments: 12/10/2008 Medtronic [id number] S explanted  ICD Follow Up Remote Check?  No Battery Voltage:  3.18 V     Charge Time:  8.9 seconds     Underlying rhythm:  SR ICD Dependent:  No       ICD Device Measurements Right Ventricle:  Amplitude: 20 mV, Impedance: 532 ohms, Threshold: 1.25 V at 0.4 msec Shock Impedance: 41/55 ohms   Episodes Coumadin:  No Shock:  0     ATP:  0     Nonsustained:  1     Ventricular Pacing:  <0.1%  Brady Parameters Mode VVI     Lower Rate Limit:  40      Tachy Zones VF:  200     VT:  OFF     VT1:  OFF     Next Cardiology Appt Due:  03/30/2010 Tech Comments:  No parameter changes.  [phone] lead stable.  Throracic impedance below threshold.  No Carelink @ this time.  ROV 3 months [facility]. Altha Harm, LPN  January 20, 2010 10:17 AM   Impression & Recommendations:  Problem # 1:  AORTIC STENOSIS, MILD (ICD-424.1)  Will repeat echo to assess valve His updated medication list for this problem includes:    Furosemide 20 Mg Tabs (Furosemide) .Marland Kitchen... Take one tablet by mouth daily.    Carvedilol 6.25 Mg Tabs (Carvedilol) .Marland Kitchen... Take one  tablet by mouth in the am and 25mg  at night    Nitroglycerin 0.4 Mg Subl (Nitroglycerin) .Marland Kitchen... Place 1 tablet under tongue as directed    Avapro 150 Mg Tabs (Irbesartan) .Marland Kitchen... Take one tablet by mouth daily  Orders: Echocardiogram (Echo)  Problem # 2:  HYPERLIPIDEMIA, MILD (ICD-272.4)  will decrease statin to 40 mg day His updated medication list for this problem includes:    Zocor 80 Mg Tabs (Simvastatin) .Marland Kitchen... Take 1/2  tablet by mouth once a day  His updated medication list for this problem includes:    Zocor 80 Mg Tabs (Simvastatin) .Marland Kitchen... Take 1/2  tablet by mouth once a day  Problem # 3:  CONGESTIVE HEART FAILURE, SYSTOLIC, CHRONIC (ICD-428.0) will try and add back ARB; has been intolerant of AcE in the past 2/2 cough and something else which does notrecall;  will check bmet in 2 weeks.  Cr 0.9 in feb His updated medication list for this problem includes:    Furosemide 20 Mg Tabs (Furosemide) .Marland Kitchen... Take one tablet by mouth daily.    Carvedilol 6.25 Mg Tabs (Carvedilol) .Marland Kitchen... Take one tablet by mouth in the am and 25mg  at night    Aspirin 81 Mg Tbec (Aspirin) ..... One by mouth daily    Nitroglycerin 0.4 Mg Subl (Nitroglycerin) .Marland Kitchen... Place 1 tablet under tongue as directed    Avapro 150 Mg Tabs (Irbesartan) .Marland Kitchen... Take one tablet by mouth daily  Problem # 4:  CARDIOMYOPATHY, ISCHEMIC S/P CABG EF 25% (ICD-414.8) sstble His updated medication list for this problem includes:    Furosemide 20 Mg Tabs (Furosemide) .Marland Kitchen... Take one tablet by mouth daily.    Carvedilol 6.25 Mg Tabs (Carvedilol) .Marland Kitchen... Take one tablet by mouth in the am and 25mg  at night    Aspirin 81 Mg Tbec (Aspirin) ..... One by mouth daily    Nitroglycerin 0.4 Mg Subl (Nitroglycerin) .Marland Kitchen... Place 1 tablet under tongue as directed    Avapro 150 Mg Tabs (Irbesartan) .Marland Kitchen... Take one tablet by mouth daily  Problem # 5:  IMPLANTABLE DEFIBRILLATOR  MDT (ICD-V45.02) Device parameters and data were reviewed and no changes were  made   Orders: EKG w/ Interpretation (93000)  Patient Instructions: 1)  Your physician has recommended you  make the following change in your medication: DECREASE simvastatin  take 1/2 tab daily. START avapro daily 2)  Your physician wants you to follow-up in:  3 months with Alisia Ferrari will receive a reminder letter in the mail two months in advance. If you don't receive a letter, please call our office to schedule the follow-up appointment. 3)  Your physician has requested that you have an echocardiogram.  Echocardiography is a painless test that uses sound waves to create images of your heart. It provides your doctor with information about the size and shape of your heart and how well your heart's chambers and valves are working.  This procedure takes approximately one hour. There are no restrictions for this procedure. 4)  Your physician recommends that you return for lab work in:2 1/2 weeks BMP Prescriptions: FUROSEMIDE 20 MG TABS (FUROSEMIDE) Take one tablet by mouth daily.  #90 x 3   Entered by:   Benedict Needy, RN   Authorized by:   Nathen May, MD, [facility]   Signed by:   Benedict Needy, RN on 01/20/2010   Method used:   Electronically to        [facility]* (mail-order)       [address]       [place], [place]  [address]       Ph: [phone]       Fax: [phone]   RxID:   [id number] CARVEDILOL 6.25 MG TABS (CARVEDILOL) Take one tablet by mouth in the am and 25mg  at night  #90 x 4   Entered by:   Benedict Needy, RN   Authorized by:   Nathen May, MD, [facility]   Signed by:   Benedict Needy, RN on 01/20/2010   Method used:   Electronically to        [facility]* (mail-order)       [address]       [place], [place]  [address]       Ph: [phone]       Fax: [phone]   RxID:   [id number]

## 2010-04-29 NOTE — Progress Notes (Signed)
Summary: Leg Cramps  Phone Note Call from Patient Call back at Home Phone [phone]   Caller: Patient Call For: RN Summary of Call: Patient is having cramps in his legs, wants to know what this can be from and what he can take for it. Please call patient to discuss further per patient. Initial call taken by: [place],  May 09, 2009 10:03 AM  Follow-up for Phone Call        per pt- legs and feet have been cramping every night to the point that it will wake him up.  Pt can not even put weight on his feet in the morning.  Instructed pt that this sounds like a PCP issue but given the fact that he is on lasix it could be that his K is low.  If pt is not able to be seen by Dr. Jonny Ruiz then we will draw BMET today.  Follow-up by: Charlena Cross, RN, BSN,  May 09, 2009 10:59 AM

## 2010-04-29 NOTE — Progress Notes (Signed)
Summary: Nuclear Pre-Procedure  Phone Note Outgoing Call   Call placed by: Milana Na, EMT-P,  November 27, 2008 2:40 PM Summary of Call: Reviewed information on Myoview Information Sheet (see scanned document for further details).  Spoke with patient.     Nuclear Med Background Indications for Stress Test: Evaluation for Ischemia, Surgical Clearance  Indications Comments: Pending Generator Replacement on 12/10/08  History: Abnormal EKG, CABG, Defibrillator, Echo, Heart Catheterization, Myocardial Infarction, Myocardial Perfusion Study  History Comments: '03 MI, Heart Cath, CABGx6  EF 25% '04 ECHO EF <25% 10/04 Defibrilator 06/08 MPS Abnormal  Symptoms: DOE    Nuclear Pre-Procedure Cardiac Risk Factors: Family History - CAD, Hypertension, Lipids Height (in): 70  Nuclear Med Study Referring MD:  [employer]

## 2010-04-29 NOTE — Progress Notes (Signed)
Summary: RX  Phone Note Refill Request Call back at Home Phone [phone] Message from:  Patient on January 27, 2010 9:19 AM  Refills Requested: Medication #1:  COREG 25 MG [facility]  Initial call taken by: Harlon Flor,  January 27, 2010 9:20 AM  Follow-up for Phone Call        notified pharmacy of refill for cavedilol.  Told patient he would receive in the mail soon. Follow-up by: Bishop Dublin, CMA,  January 27, 2010 12:13 PM

## 2010-04-29 NOTE — Procedures (Signed)
Summary: PACER/AMD   Current Medications (verified): 1)  Furosemide 20 Mg Tabs (Furosemide) .... Take One Tablet By Mouth Daily. 2)  Carvedilol 25 Mg Tabs (Carvedilol) .... Take 6.25mg  in The Morning and 25mg  At Night 3)  Omeprazole 20 Mg Cpdr (Omeprazole) .... One By Mouth Daily 4)  Aspirin 81 Mg Tbec (Aspirin) .... One By Mouth Daily 5)  Nitroglycerin 0.4 Mg Subl (Nitroglycerin) .... Place 1 Tablet Under Tongue As Directed 6)  Promethazine Hcl 25 Mg Tabs (Promethazine Hcl) .... Take 1 Tablet By Mouth Once A Day As Needed 7)  Zocor 80 Mg Tabs (Simvastatin) .... Take 1 Tablet By Mouth Once A Day  Allergies (verified): 1)  ! Crestor    ICD Specifications Following MD:  Sherryl Manges, MD     ICD Vendor:  [employer]tronic     ICD Model Number:  [id number]     ICD Serial Number:  [id number] ICD DOI:  12/10/2008     ICD Implanting MD:  Sherryl Manges, MD  Lead 1:    Location: RV     DOI: 01/17/2003     Model #: [id number]     Serial #: [id number]     Status: active Lead 2:    Location: RV     DOI: 12/10/2008     Model #: [id number]     Serial #: [id number]     Status: active  Indications::  ICM  Explantation Comments: 12/10/2008 Medtronic [id number] S explanted  ICD Follow Up Remote Check?  No Battery Voltage:  3.19 V     Charge Time:  8.7 seconds     Underlying rhythm:  SR ICD Dependent:  No       ICD Device Measurements Right Ventricle:  Amplitude: 20 mV, Impedance: 532 ohms, Threshold: 1.0 V at 0.4 msec Shock Impedance: 42/54 ohms   Episodes Coumadin:  No Shock:  0     ATP:  0     Nonsustained:  0     Ventricular Pacing:  <0.1%  Brady Parameters Mode VVI     Lower Rate Limit:  40      Tachy Zones VF:  200     VT:  OFF     VT1:  OFF     Next Cardiology Appt Due:  12/28/2009 Tech Comments:  No parameter changes.  Device function normal.  Optivol and thoracic impedance normal.  Patient activity level decreased early June due to hernia repair.  ROV 3 months with Dr. Graciela Husbands in [place]. Altha Harm, LPN  October 04, 3498 11:39 AM  Prescriptions: OMEPRAZOLE 20 MG CPDR (OMEPRAZOLE) one by mouth daily  #90 x 3   Entered by:   Altha Harm, LPN   Authorized by:   Nathen May, MD, [facility]   Signed by:   Nathen May, MD, [facility] on 10/04/2009   Method used:   Electronically to        [facility]  [address] [phone]* (retail)       [address]       [place], [place]  [address]       Ph: [phone] or [phone]       Fax: [phone]   RxID:   [id number] FUROSEMIDE 20 MG TABS (FUROSEMIDE) Take one tablet by mouth daily.  #90 x 3   Entered by:   Altha Harm, LPN   Authorized by:   Nathen May, MD, [facility]   Signed by:  Nathen May, MD, [facility] on 10/04/2009   Method used:   Electronically to        [facility]  [address] [phone]* (retail)       [address]       [place], [place]  [address]       Ph: [phone] or [phone]       Fax: [phone]   RxID:   [id number]

## 2010-04-29 NOTE — Miscellaneous (Signed)
Summary: crestor intolerance update  Clinical Lists Changes  Allergies: Added new allergy or adverse reaction of CRESTOR Observations: Added new observation of NKA: F (12/13/2008 15:46)

## 2010-04-29 NOTE — Cardiovascular Report (Signed)
Summary: Office Visit  Office Visit   Imported By: Roderic Ovens 01/22/2009 11:51:32  _____________________________________________________________________  External Attachment:    Type:   Image     Comment:   External Document

## 2010-04-29 NOTE — Assessment & Plan Note (Signed)
Summary: device check medtronic defib/LAB/sl   Visit Type:  Follow-up Primary Provider:  Dr. Jonny Ruiz  CC:  normal swelling and some sob when bend over.  History of Present Illness: [name] is seen in followup for ischemic heart disease. He is status post ICD implantation for primary prevention with 6949-lead. He recently underwent device generator replacement with insertion of a new right ventricular pace sense lead.  He denies problems with chest pain. He does have chronic mild shortness of breath. and notes that there is shortness of breath particularly when bending for a long period of time. There has been no peripheral edema.  his last echo from fall 2009 and was treated mild to moderate aortic stenosis  Current Problems (verified): 1)  Hyperlipidemia, Mild  (ICD-272.4) 2)  Implantable Defibrillator Mdt  (ICD-V45.02) 3)  Mech Comp Due Auto Implantable Card Defib [id number] Lead  (ICD-996.04) 4)  Congestive Heart Failure, Systolic, Chronic  (ICD-428.0) 5)  Cardiomyopathy, Ischemic S/p Cabg Ef 25%  (ICD-414.8)  Current Medications (verified): 1)  Furosemide 20 Mg Tabs (Furosemide) .... Take One Tablet By Mouth Daily. 2)  Carvedilol 25 Mg Tabs (Carvedilol) .... Take 6.25mg  in The Morning and 25mg  At Night 3)  Omeprazole 20 Mg Cpdr (Omeprazole) .... One By Mouth Daily 4)  Aspirin 81 Mg Tbec (Aspirin) .... One By Mouth Daily 5)  Magnesium Oxide 400 Mg Tabs (Magnesium Oxide) .... Take 1 Tablet As Needed 6)  Nitroglycerin 0.4 Mg Subl (Nitroglycerin) .... Place 1 Tablet Under Tongue As Directed 7)  Promethazine Hcl 25 Mg Tabs (Promethazine Hcl) .... Take 1 Tablet By Mouth Once A Day As Needed 8)  Zocor 80 Mg Tabs (Simvastatin) .... Take 1 Tablet By Mouth Once A Day  Allergies (verified): 1)  ! Crestor  Past History:  Past Medical History: Last updated: 05/08/2008 1. Ischemic cardiomyopathy with;       a.     Prior bypass.       b.     Ejection fraction of 25%.   2. Orthostatic  intolerance.   3. Cough, question related to lisinopril versus infection.   4. Systolic hypertension.   5. Dyslipidemia.   6. Status post implantable cardioverter-defibrillator implantation       with 6949 lead.   Past Surgical History: Last updated: 05/08/2008 1. Severe three-vessel coronary artery disease with  unstable angina and acute myocardial infarction. 2. Ischemic heart disease, prior myocardial  infarction, ejection fraction of less than 20% (Master study). 3. Medtronic ICD implantation on January 17, 2003.  Family History: Last updated: 05/08/2008  His father suffered a myocardial infarction at age 51. His  mother died at 49 of Alzheimer's disease.  Social History: Last updated: 05/08/2008 Full Time Married  Tobacco Use - No.  Alcohol Use - no  Vital Signs:  Patient profile:   70 year old male Height:      70 inches Weight:      183.75 pounds Pulse rate:   58 / minute Pulse rhythm:   regular BP sitting:   142 / 80  (left arm) Cuff size:   large  Vitals Entered By: Charlena Cross, RN, BSN (April 29, 2009 11:06 AM)  Physical Exam  General:  Well developed, well nourished, in no acute distress. Head:  HEENT normal Neck:  Neck supple, no JVD. No masses, thyromegaly or abnormal cervical nodes. Chest Wall:  device pocket well-healed Lungs:  clear to auscultation and percussion Heart:  regular rate and rhythm with a split S2-1-2 to  3/6 systolic murmur mid peaking heard in the left lower sternal border out to the apex Abdomen:  soft nontenderwithout hepatomegaly Msk:  Back normal, normal gait. Muscle strength and tone normal. Extremities:  No clubbing or cyanosis. Neurologic:  Alert and oriented x 3.grossly normal    ICD Specifications Following MD:  Sherryl Manges, MD     ICD Vendor:  Medtronic     ICD Model Number:  D274VRC     ICD Serial Number:  [id number] ICD DOI:  12/10/2008     ICD Implanting MD:  Sherryl Manges, MD  Lead 1:    Location: RV      DOI: 01/17/2003     Model #: [id number]     Serial #: [id number]     Status: active Lead 2:    Location: RV     DOI: 12/10/2008     Model #: [id number]     Serial #: [id number]     Status: active  Indications::  ICM  Explantation Comments: 12/10/2008 Medtronic 7564/[id number] S explanted  ICD Follow Up Remote Check?  No Battery Voltage:  3.21 V     Charge Time:  8.9 seconds     Underlying rhythm:  SR ICD Dependent:  No       ICD Device Measurements Right Ventricle:  Amplitude: 20 mV, Impedance: 532 ohms, Threshold: 1.0 V at 0.4 msec Shock Impedance: 38/49 ohms   Episodes Coumadin:  No Shock:  0     ATP:  0     Nonsustained:  2     Ventricular Pacing:  <0.1%  Brady Parameters Mode VVI     Lower Rate Limit:  40      Tachy Zones VF:  200     VT:  OFF     VT1:  OFF     Next Cardiology Appt Due:  06/28/2009 Tech Comments:  RV reprogrammed 2.0@0 .4.  Device function normal.  Site well healed.  ROV 3 months [facility]. Altha Harm, LPN  April 29, 2009 11:12 AM   Impression & Recommendations:  Problem # 1:  CONGESTIVE HEART FAILURE, SYSTOLIC, CHRONIC (ICD-428.0) his heart failure remains chronic. Consideration at his next visit we'll have to include the addition of Aldactone and ARB's;  he has been intolerant of lisinopril in the past. His updated medication list for this problem includes:    Furosemide 20 Mg Tabs (Furosemide) .Marland Kitchen... Take one tablet by mouth daily.    Carvedilol 25 Mg Tabs (Carvedilol) .Marland Kitchen... Take 6.25mg  in the morning and 25mg  at night    Aspirin 81 Mg Tbec (Aspirin) ..... One by mouth daily    Nitroglycerin 0.4 Mg Subl (Nitroglycerin) .Marland Kitchen... Place 1 tablet under tongue as directed  Problem # 2:  AORTIC STENOSIS, MILD (ICD-424.1) we'll plan an echo at his next visit to reassess aortic valve function and mitral valve function His updated medication list for this problem includes:    Furosemide 20 Mg Tabs (Furosemide) .Marland Kitchen... Take one tablet by mouth daily.    Carvedilol 25 Mg  Tabs (Carvedilol) .Marland Kitchen... Take 6.25mg  in the morning and 25mg  at night    Nitroglycerin 0.4 Mg Subl (Nitroglycerin) .Marland Kitchen... Place 1 tablet under tongue as directed  Problem # 3:  MECH COMP DUE AUTO IMPLANTABLE CARD DEFIB [id number] LEAD (ICD-996.04) this was replaced with a 5076 rate sense lead  Problem # 4:  IMPLANTABLE DEFIBRILLATOR  MDT (ICD-V45.02) Device parameters and data were reviewed and no changes were made

## 2010-04-29 NOTE — Progress Notes (Signed)
Summary: CALL BACK  Phone Note Call from Patient Call back at Home Phone [phone]   Caller: SELF Call For: KLEIN Summary of Call: PT IS WAITING FOR A CALL BACK ABOUT HIS POTASSIUM-HE THOUGHT THAT HE WOULD GET A CALL TODAY Initial call taken by: Harlon Flor,  May 10, 2009 3:34 PM  Follow-up for Phone Call        instructed pt that we would have results on Monday but that if anything was grossly   K level normal.  pts instructed that this is not the cause of his cramps. left message.  Follow-up by: Charlena Cross, RN, BSN,  May 13, 2009 8:31 AM

## 2010-04-29 NOTE — Progress Notes (Signed)
Summary: Mail Order Presc.  Phone Note Call from Patient Call back at Home Phone [phone]   Caller: Patient Summary of Call: Please call  to discuss mail in prescripton for prescription solutiouns mail order Initial call taken by: [place],  December 31, 2008 2:33 PM    Prescriptions: OMEPRAZOLE 20 MG CPDR (OMEPRAZOLE) one by mouth daily  #90 x 3   Entered by:   Mercer Pod   Authorized by:   Nathen May, MD, [facility]   Signed by:   Mercer Pod on 01/02/2009   Method used:   Faxed to ...       [facility] (retail)             , [place]         Ph:        Fax: [phone]   RxID:   [id number] OMEPRAZOLE 20 MG CPDR (OMEPRAZOLE) one by mouth daily  #90 x 3   Entered by:   Mercer Pod   Authorized by:   Nathen May, MD, [facility]   Signed by:   Mercer Pod on 01/02/2009   Method used:   Faxed to ...       [facility] (retail)             , [place]         Ph:        Fax: [phone]   RxID:   [id number] MAGNESIUM OXIDE 400 MG TABS (MAGNESIUM OXIDE) Take 1 tablet as needed  #0 x 0   Entered by:   Mercer Pod   Authorized by:   Nathen May, MD, [facility]   Signed by:   Mercer Pod on 01/02/2009   Method used:   Faxed to ...       [facility] (retail)             , [place]         Ph:        Fax: [phone]   RxID:   [id number] COREG 6.25 MG TABS (CARVEDILOL) 1 tab by mouth every morning  #90 x 3   Entered by:   Mercer Pod   Authorized by:   Nathen May, MD, [facility]   Signed by:   Mercer Pod on 01/02/2009   Method used:   Faxed to ...       [facility] ([facility]ail)             , [place]         Ph:        Fax: [phone]   RxID:   [id number] ZOCOR 80 MG TABS (SIMVASTATIN) Take 1 tablet by mouth once a day  #90 x 3   Entered by:   Mercer Pod   Authorized by:   Nathen May, MD, [facility]   Signed by:   Mercer Pod on 01/02/2009   Method used:   Faxed to ...      [facility]ail)             , [place]         Ph:        Fax: [phone]   RxID:   [id number] CARVEDILOL 25 MG TABS (CARVEDILOL) tab by mouth every evening  #90 x 3   Entered by:   Mercer Pod   Authorized by:   Nathen May, MD, [facility]   Signed by:   Mercer Pod on 01/02/2009   Method used:  Faxed to ...       [facility]ail)             , [place]         Ph:        Fax: [phone]   RxID:   [id number] FUROSEMIDE 20 MG TABS (FUROSEMIDE) Take one tablet by mouth daily.  #90 x 3   Entered by:   Mercer Pod   Authorized by:   Nathen May, MD, [facility]   Signed by:   Mercer Pod on 01/02/2009   Method used:   Faxed to ...       [facility] (retail)             , [place]         Ph:        Fax: [phone]   RxID:   [id number]

## 2010-04-29 NOTE — Letter (Signed)
Summary: Implantable Device Instructions  Architectural technologist at [address]   [place], [place] [address]   Phone: [phone]  Fax: [phone]      Implantable Device Instructions  You are scheduled for:  _____ Permanent Transvenous Pacemaker _____ Implantable Cardioverter Defibrillator _____ Implantable Loop Recorder ___x__ Generator Change  on 12/10/2008 @ 8:00am with Dr. Klein_____.  1.  Please arrive at the Short Stay Center at [facility] at 5:30am on the day of your procedure.  2.  Do not eat or drink the night before your procedure.  3.  Complete lab work on 12/06/2008 @ [facility].  4.  Do NOT take these medications prior to your procedure:  furosemide.    5.  Plan for an overnight stay.  Bring your insurance cards and a list of your medications.  6.  Wash your chest and neck with antibacterial soap (any brand) the evening before and the morning of your procedure.  Rinse well.  7.  Education material received:     Pacemaker _____           ICD _____           Arrhythmia _____  *If you have ANY questions after you get home, please call the office [phone].  *Every attempt is made to prevent procedures from being rescheduled.  Due to the nauture of Electrophysiology, rescheduling can happen.  The physician is always aware and directs the staff when this occurs.

## 2010-04-29 NOTE — Miscellaneous (Signed)
Summary: Device change out  Clinical Lists Changes  Observations: Added new observation of ICDLEADSTAT2: active (12/11/2008 8:51) Added new observation of ICDLEADSER2: [id number] (12/11/2008 8:51) Added new observation of ICDLEADMOD2: 5076  (12/11/2008 8:51) Added new observation of ICDLEADDOI2: 12/10/2008  (12/11/2008 8:51) Added new observation of ICDLEADLOC2: RV  (12/11/2008 8:51) Added new observation of ICD IMPL DTE: 12/10/2008  (12/11/2008 8:51) Added new observation of ICD SERL#: [id number] H  (12/11/2008 8:51) Added new observation of ICD MODL#: D274VRC  (12/11/2008 8:51) Added new observation of ICDEXPLCOMM: 12/10/2008 Medtronic 1308/[id number] S explanted  (12/11/2008 8:51) Added new observation of ICD MD: Sherryl Manges, MD  (12/11/2008 8:51)      ICD Specifications Following MD:  Sherryl Manges, MD     ICD Vendor:  Medtronic     ICD Model Number:  D274VRC     ICD Serial Number:  [id number] ICD DOI:  12/10/2008     ICD Implanting MD:  Sherryl Manges, MD  Lead 1:    Location: RV     DOI: 01/17/2003     Model #: 4010     Serial #: [id number]     Status: active Lead 2:    Location: RV     DOI: 12/10/2008     Model #: 0347     Serial #: [id number]     Status: active  Indications::  ICM  Explantation Comments: 12/10/2008 Medtronic 6433/[id number] S explanted  ICD Specs ICD Dependent:  No      Episodes  Brady Parameters  Tachy Zones

## 2010-04-29 NOTE — Miscellaneous (Signed)
Summary: Device preload  Clinical Lists Changes  Observations: Added new observation of ICD INDICATN: ICM (05/07/2008 11:38) Added new observation of ICDLEADSTAT1: active (05/07/2008 11:38) Added new observation of ICDLEADSER1: [id number] V (05/07/2008 11:38) Added new observation of ICDLEADMOD1: 5284  (05/07/2008 11:38) Added new observation of ICDLEADDOI1: 01/17/2003  (05/07/2008 11:38) Added new observation of ICDLEADLOC1: RV  (05/07/2008 11:38) Added new observation of ICD IMP MD: Sherryl Manges, MD  (05/07/2008 11:38) Added new observation of ICD IMPL DTE: 01/17/2003  (05/07/2008 11:38) Added new observation of ICD SERL#: [id number] S  (05/07/2008 11:38) Added new observation of ICD MODL#: 7232  (05/07/2008 11:38) Added new observation of ICDMANUFACTR: Medtronic  (05/07/2008 11:38) Added new observation of CARDIO MD: Sherryl Manges, MD  (05/07/2008 11:38)      PPM Specifications Following MD:  Sherryl Manges, MD       ICD Specifications Following MD: Sherryl Manges, MD      ICD Vendor: Medtronic     ICD Model Number: 7232     ICD Serial Number: [id number]  ICD DOI: 01/17/2003     ICD Implanting MD: Sherryl Manges, MD  Lead 1:    Location: RV     DOI: 01/17/2003     Model #: 3474     Serial #: [id number]     Status: active  Indications::  ICM

## 2010-04-29 NOTE — Progress Notes (Signed)
Summary: med questions  Phone Note Outgoing Call   Summary of Call: called pt to inquire about being able to afford crestor.  also need to verify coreg dose currently taking.  LMTC.  Initial call taken by: Charlena Cross, RN, BSN,  December 13, 2008 3:41 PM     Appended Document: med questions    Clinical Lists Changes  Medications: Changed medication from CARVEDILOL 25 MG TABS (CARVEDILOL) one half by mouth two times a day to CARVEDILOL 25 MG TABS (CARVEDILOL) tab by mouth every evening - Signed Added new medication of COREG 6.25 MG TABS (CARVEDILOL) 2 tab by mouth every morning Rx of CARVEDILOL 25 MG TABS (CARVEDILOL) tab by mouth every evening;  #30 x 6;  Signed;  Entered by: Charlena Cross, RN, BSN;  Authorized by: Nathen May, MD, [facility];  Method used: Electronically to [facility]  [address] [phone]*, [address] [place], [place], [place]  [address], Ph: [phone] or [phone], Fax: [phone]    Prescriptions: CARVEDILOL 25 MG TABS (CARVEDILOL) tab by mouth every evening  #30 x 6   Entered by:   Charlena Cross, RN, BSN   Authorized by:   Nathen May, MD, [facility]   Signed by:   Charlena Cross, RN, BSN on 12/13/2008   Method used:   Electronically to        [facility]  [address] [phone]* (retail)       [address]       [place], [place]  [address]       Ph: [phone] or [phone]       Fax: [phone]   RxID:   [id number]

## 2010-04-29 NOTE — Progress Notes (Signed)
Summary: LAB RESULTS  Phone Note Call from Patient Call back at Home Phone [phone]   Caller: SELF Call For: [facility] Summary of Call: PT CALLING ABOUT [phone] Initial call taken by: Harlon Flor,  May 13, 2009 10:24 AM  Follow-up for Phone Call        pt given results. Charlena Cross RN BSN

## 2010-04-29 NOTE — Procedures (Signed)
Summary: wound check/changeout   Current Medications (verified): 1)  Furosemide 20 Mg Tabs (Furosemide) .... Take One Tablet By Mouth Daily. 2)  Carvedilol 25 Mg Tabs (Carvedilol) .... Take 6.25mg  in The Morning and 25mg  At Night 3)  Omeprazole 20 Mg Cpdr (Omeprazole) .... One By Mouth Daily 4)  Aspirin 81 Mg Tbec (Aspirin) .... One By Mouth Daily 5)  Magnesium Oxide 400 Mg Tabs (Magnesium Oxide) .... Take 1 Tablet As Needed 6)  Nitroglycerin 0.4 Mg Subl (Nitroglycerin) .... Place 1 Tablet Under Tongue As Directed 7)  Promethazine Hcl 25 Mg Tabs (Promethazine Hcl) .... Take 1 Tablet By Mouth Once A Day 8)  Zocor 80 Mg Tabs (Simvastatin) .... Take 1 Tablet By Mouth Once A Day  Allergies (verified): 1)  ! Crestor   ICD Specifications Following MD:  Sherryl Manges, MD     ICD Vendor:  Medtronic     ICD Model Number:  D274VRC     ICD Serial Number:  [id number] ICD DOI:  12/10/2008     ICD Implanting MD:  Sherryl Manges, MD  Lead 1:    Location: RV     DOI: 01/17/2003     Model #: 5366     Serial #: [id number]     Status: active Lead 2:    Location: RV     DOI: 12/10/2008     Model #: 9563     Serial #: [id number]     Status: active  Indications::  ICM  Explantation Comments: 12/10/2008 Medtronic 1884/[id number] S explanted  ICD Follow Up Remote Check?  No Battery Voltage:  3.21 V     Charge Time:  8.9 seconds     Underlying rhythm:  SR WITH PVC'S ICD Dependent:  No       ICD Device Measurements Right Ventricle:  Amplitude: 20 mV, Impedance: 551 ohms, Threshold: 1.0 V at 0.4 msec Shock Impedance: 41/55 ohms   Episodes Coumadin:  No Shock:  0     ATP:  0     Nonsustained:  0     Ventricular Pacing:  <0.1%  Brady Parameters Mode VVI     Lower Rate Limit:  40      Tachy Zones VF:  200     VT:  OFF     VT1:  OFF     Next Cardiology Appt Due:  02/27/2009 Tech Comments:  Normal device function.  Wound check appt today.  Steri-strips already removed by patient.  Wound without redness or  swelling.  No changes made.  ROV 3 months Dr Graciela Husbands in [facility] office. Gypsy Balsam RN BSN  December 24, 2008 9:39 AM

## 2010-04-29 NOTE — Assessment & Plan Note (Signed)
Summary: Cardiology Nuclear Study  Nuclear Med Background Indications for Stress Test: Evaluation for Ischemia, Surgical Clearance  Indications Comments: Pending Generator Replacement on 12/10/08 by Dr. Graciela Husbands  History: CABG, Defibrillator, Echo, Heart Catheterization, Myocardial Infarction, Myocardial Perfusion Study  History Comments: '03 M>CABGx6, EF 25%; '04 ECHO: EF= <25%; '04 AICD; '08 BMW:UXLKG antero-septal, apical infarct with mild peri-infarct ischemia anteriorly  Symptoms: Dizziness, DOE, Fatigue, Light-Headedness    Nuclear Pre-Procedure Cardiac Risk Factors: Family History - CAD, Hypertension, Lipids Caffeine/Decaff Intake: none NPO After: 8:30 PM Lungs: Clear IV 0.9% NS with Angio Cath: 22g     IV Site: (L) AC IV Started by: Irean Hong RN Chest Size (in) 42     Height (in): 70 Weight (lb): 180 BMI: 25.92  Nuclear Med Study 1 or 2 day study:  1 day     Stress Test Type:  Adenosine Reading MD:  Marca Ancona, MD     Referring MD:  Berton Mount, MD Resting Radionuclide:  Technetium 29m Tetrofosmin     Resting Radionuclide Dose:  11 mCi  Stress Radionuclide:  Technetium 61m Tetrofosmin     Stress Radionuclide Dose:  33 mCi   Stress Protocol      Max HR:  66 bpm Max Systolic BP: 165 mm HgRate Pressure Product:  40102 Dose of Adenosine:  45.8 mg    Stress Test Technologist:  Rea [employer] CMA-N     Nuclear Technologist:  Domenic Polite CNMT  Rest Procedure  Myocardial perfusion imaging was performed at rest 45 minutes following the intraveneous administration of Myoview Technetium 60m Tetrofosmin.  Stress Procedure  The patient received IV adenosine at 140 mcg/kg/min for 4 minutes. There were no significant changes with infusion, only occasional PVC's. Myoview was injected at the 2 minute mark and quantitative spect images were obtained after a 45 minute delay.  QPS Raw Data Images:  Normal; no motion artifact; normal heart/lung ratio. Stress Images:  Decreased  radiotracer counts in the basal to mid anterior wall, the anteroseptal wall, and the basal to mid inferoseptal wall.  Rest Images:  Similar to stress images.  Subtraction (SDS):  Primarily fixed defect involving the anteroseptal wall and the basal to mid anterior and inferoseptal walls.  Transient Ischemic Dilatation:  .97  (Normal <1.22)  Lung/Heart Ratio:  .38  (Normal <0.45)  Quantitative Gated Spect Images QGS EDV:  246 ml QGS ESV:  165 ml QGS EF:  33 % QGS cine images:  Septal and anterior hypokinesis to akinesis.    Overall Impression  Exercise Capacity: Adenosine study with no exercise. BP Response: Hypertensive blood pressure response. Clinical Symptoms: Warmth. ECG Impression: Baseline LVH with repolarization abnormality, no change with infusion.  Overall Impression: Large, primarily fixed defect involving the anterior wall and the septum.  Moderately decreased systolic function.   Appended Document: Cardiology Nuclear Study pt aware of results Cala Bradford :)

## 2010-05-30 ENCOUNTER — Other Ambulatory Visit (INDEPENDENT_AMBULATORY_CARE_PROVIDER_SITE_OTHER): Payer: Medicare Other

## 2010-05-30 ENCOUNTER — Encounter: Payer: Self-pay | Admitting: Internal Medicine

## 2010-05-30 ENCOUNTER — Encounter (INDEPENDENT_AMBULATORY_CARE_PROVIDER_SITE_OTHER): Payer: Medicare Other

## 2010-05-30 DIAGNOSIS — Z79899 Other long term (current) drug therapy: Secondary | ICD-10-CM

## 2010-05-30 DIAGNOSIS — I428 Other cardiomyopathies: Secondary | ICD-10-CM

## 2010-05-30 DIAGNOSIS — E785 Hyperlipidemia, unspecified: Secondary | ICD-10-CM

## 2010-06-05 LAB — CONVERTED CEMR LAB
ALT: 11 units/L (ref 0–53)
AST: 16 units/L (ref 0–37)
Albumin: 4.1 g/dL (ref 3.5–5.2)
Alkaline Phosphatase: 60 units/L (ref 39–117)
BUN: 13 mg/dL (ref 6–23)
Bilirubin, Direct: 0.1 mg/dL (ref 0.0–0.3)
CO2: 26 meq/L (ref 19–32)
Calcium: 8.9 mg/dL (ref 8.4–10.5)
Chloride: 106 meq/L (ref 96–112)
Cholesterol: 215 mg/dL — ABNORMAL HIGH (ref 0–200)
Creatinine, Ser: 0.97 mg/dL (ref 0.40–1.50)
Glucose, Bld: 115 mg/dL — ABNORMAL HIGH (ref 70–99)
HDL: 44 mg/dL (ref >39)
Indirect Bilirubin: 0.4 mg/dL (ref 0.0–0.9)
LDL Cholesterol: 146 mg/dL — ABNORMAL HIGH (ref 0–99)
Potassium: 4.3 meq/L (ref 3.5–5.3)
Sodium: 142 meq/L (ref 135–145)
Total Bilirubin: 0.5 mg/dL (ref 0.3–1.2)
Total CHOL/HDL Ratio: 4.9
Total Protein: 6.8 g/dL (ref 6.0–8.3)
Triglycerides: 126 mg/dL (ref <150)
VLDL: 25 mg/dL (ref 0–40)

## 2010-06-10 NOTE — Procedures (Signed)
Summary: 3 mth defib. check/sab   Current Medications (verified): 1)  Furosemide 20 Mg Tabs (Furosemide) .... Take One Tablet By Mouth Daily. 2)  Carvedilol 6.25 Mg Tabs (Carvedilol) .... Take One Tablet By Mouth in The Am and 25mg  At Night 3)  Omeprazole 20 Mg Cpdr (Omeprazole) .... One By Mouth Daily 4)  Aspirin 81 Mg Tbec (Aspirin) .... One By Mouth Daily 5)  Nitroglycerin 0.4 Mg Subl (Nitroglycerin) .... Place 1 Tablet Under Tongue As Directed 6)  Zocor 80 Mg Tabs (Simvastatin) .... Take 1/2  Tablet By Mouth Once A Day  Allergies (verified): 1)  ! Crestor   ICD Specifications Following MD:  Sherryl Manges, MD     ICD Vendor:  Medtronic     ICD Model Number:  D274VRC     ICD Serial Number:  [id number] ICD DOI:  12/10/2008     ICD Implanting MD:  Sherryl Manges, MD  Lead 1:    Location: RV     DOI: 01/17/2003     Model #: 4098     Serial #: [id number]     Status: active Lead 2:    Location: RV     DOI: 12/10/2008     Model #: [id number]     Serial #: [id number]     Status: active  Indications::  ICM  Explantation Comments: 12/10/2008 Medtronic 6295/[id number] S explanted  ICD Follow Up Remote Check?  No ICD Dependent:  No      Episodes Coumadin:  No  Brady Parameters Mode VVI     Lower Rate Limit:  40      Tachy Zones VF:  200     VT:  OFF     VT1:  OFF     Tech Comments:  See [employer]

## 2010-06-16 LAB — CBC
HCT: 29.3 % — ABNORMAL LOW (ref 39.0–52.0)
Hemoglobin: 9.1 g/dL — ABNORMAL LOW (ref 13.0–17.0)
MCHC: 31.2 g/dL (ref 30.0–36.0)
MCV: 70.8 fL — ABNORMAL LOW (ref 78.0–100.0)
Platelets: 271 10*3/uL (ref 150–400)
RBC: 4.14 MIL/uL — ABNORMAL LOW (ref 4.22–5.81)
RDW: 17.4 % — ABNORMAL HIGH (ref 11.5–15.5)
WBC: 6.2 10*3/uL (ref 4.0–10.5)

## 2010-06-16 LAB — BASIC METABOLIC PANEL
BUN: 13 mg/dL (ref 6–23)
CO2: 26 mEq/L (ref 19–32)
Calcium: 8.8 mg/dL (ref 8.4–10.5)
Chloride: 108 mEq/L (ref 96–112)
Creatinine, Ser: 1.02 mg/dL (ref 0.4–1.5)
GFR calc Af Amer: >60 mL/min (ref >60)
GFR calc non Af Amer: >60 mL/min (ref >60)
Glucose, Bld: 91 mg/dL (ref 70–99)
Potassium: 4.8 mEq/L (ref 3.5–5.1)
Sodium: 137 mEq/L (ref 135–145)

## 2010-06-17 NOTE — Cardiovascular Report (Signed)
Summary: Office Visit Office Visit   Office Visit Office Visit   Imported By: Roderic Ovens 06/09/2010 14:52:06  _____________________________________________________________________  External Attachment:    Type:   Image     Comment:   External Document

## 2010-07-04 LAB — BASIC METABOLIC PANEL
BUN: 6 mg/dL (ref 6–23)
CO2: 29 mEq/L (ref 19–32)
Calcium: 8.9 mg/dL (ref 8.4–10.5)
Chloride: 107 mEq/L (ref 96–112)
Creatinine, Ser: 0.98 mg/dL (ref 0.4–1.5)
GFR calc Af Amer: >60 mL/min (ref >60)
GFR calc non Af Amer: >60 mL/min (ref >60)
Glucose, Bld: 107 mg/dL — ABNORMAL HIGH (ref 70–99)
Potassium: 3.9 mEq/L (ref 3.5–5.1)
Sodium: 142 mEq/L (ref 135–145)

## 2010-07-04 LAB — URINALYSIS, MICROSCOPIC ONLY
Bilirubin Urine: NEGATIVE
Glucose, UA: NEGATIVE mg/dL
Ketones, ur: NEGATIVE mg/dL
Leukocytes, UA: NEGATIVE
Nitrite: NEGATIVE
Protein, ur: NEGATIVE mg/dL
Specific Gravity, Urine: 1.011 (ref 1.005–1.030)
Urobilinogen, UA: 1 mg/dL (ref 0.0–1.0)
pH: 5.5 (ref 5.0–8.0)

## 2010-07-04 LAB — CBC
HCT: 31.6 % — ABNORMAL LOW (ref 39.0–52.0)
HCT: 32.2 % — ABNORMAL LOW (ref 39.0–52.0)
Hemoglobin: 10.2 g/dL — ABNORMAL LOW (ref 13.0–17.0)
Hemoglobin: 10.4 g/dL — ABNORMAL LOW (ref 13.0–17.0)
MCHC: 32.3 g/dL (ref 30.0–36.0)
MCHC: 32.5 g/dL (ref 30.0–36.0)
MCV: 77.2 fL — ABNORMAL LOW (ref 78.0–100.0)
MCV: 78 fL (ref 78.0–100.0)
Platelets: 177 10*3/uL (ref 150–400)
Platelets: 177 10*3/uL (ref 150–400)
RBC: 4.09 MIL/uL — ABNORMAL LOW (ref 4.22–5.81)
RBC: 4.13 MIL/uL — ABNORMAL LOW (ref 4.22–5.81)
RDW: 16.4 % — ABNORMAL HIGH (ref 11.5–15.5)
RDW: 16.5 % — ABNORMAL HIGH (ref 11.5–15.5)
WBC: 5.1 10*3/uL (ref 4.0–10.5)
WBC: 6.4 10*3/uL (ref 4.0–10.5)

## 2010-07-04 LAB — CULTURE, BLOOD (ROUTINE X 2)
Culture: NO GROWTH
Culture: NO GROWTH

## 2010-07-04 LAB — URINE CULTURE
Colony Count: NO GROWTH
Culture: NO GROWTH

## 2010-07-04 LAB — BRAIN NATRIURETIC PEPTIDE: Pro B Natriuretic peptide (BNP): 472 pg/mL — ABNORMAL HIGH (ref 0.0–100.0)

## 2010-07-28 ENCOUNTER — Other Ambulatory Visit: Payer: Self-pay | Admitting: Emergency Medicine

## 2010-07-28 MED ORDER — SIMVASTATIN 80 MG PO TABS: 40.0000 mg | ORAL_TABLET | Freq: Every day | ORAL | Status: DC

## 2010-08-12 NOTE — Assessment & Plan Note (Signed)
[facility] HEALTHCARE                            CARDIOLOGY OFFICE NOTE   JACOBI, NILE                       MRN:          [id number]  DATE:09/24/2006                            DOB:          Sep 05, 1940    PRIMARY CARE PHYSICIAN:  Oliver Barre, M.D.   HISTORY OF PRESENT ILLNESS:  Mr. Viscomi is a 70 year old gentleman with  ischemic cardiomyopathy after an anterior myocardial infarction and  emergent coronary artery bypass grafting in November of 2003.  His EF is  approximately 25%.   I saw him 3 weeks ago when he had new exertional chest discomfort and  dyspnea.  He was in moderate heart failure.  We initially planned on  initiating aldactone.  However, baseline potassium was 5.2 and rose to  5.5 with just a few days of aldactone.  I therefore elected to stop it  in favor of Lasix 20 mg daily.  With this, he has lost 5 pounds and his  chest discomfort and dyspnea have resolved.   CURRENT MEDICATIONS:  1. Coreg 25 mg twice daily.  2. Enteric coated aspirin 81 mg daily.  3. Prilosec OTC.  4. Lisinopril 20 mg daily.  5. Simvastatin 80 mg daily.  6. Tylenol 650 mg daily.  7. Lasix 20 mg daily.   PHYSICAL EXAM:  He appears quite well and markedly better than a few  weeks ago.  Heart rate 53, blood pressure 132/77 and weight is 187  pounds.  Weight is down 5 pounds from 3 weeks ago.  HEENT:  Normal.  SKIN EXAM:  Normal.  JUGULAR VENOUS PRESSURE:  Is less than 5 cm.  There is no thyromegaly or  lymphadenopathy.  RESPIRATORY EFFORT:  Normal.  LUNGS:  Clear to auscultation.  He has a laterally displaced point of  maximal cardiac impulse.  There is a regular rate and rhythm with normal  S1 and S2.  There is no murmur, S3, or S4.  ABDOMEN:  Soft, nondistended, nontender.  There is no  hepatosplenomegaly.  Bowel sounds are normal.  EXTREMITIES:  Warm without clubbing, cyanosis, edema, or ulceration.  Carotid pulses are 2+ bilaterally without bruit.   IMPRESSION/RECOMMENDATIONS:  1. Angina:  Since it has resolved as we have treated his heart failure      and stress test showed only mild periinfarct ischemia, I suspect it      was subendocardial ischemia in the setting of volume overload.      Will not investigate further.  Continue current medical therapy.  2. Chronic systolic heart failure:  Now New York Heart Association      Class II.  Continue current medical therapy.  3. Leg cramping:  Check BMET today.  May need to supplement the      potassium.  Will go ahead and initiate magnesium oxide 400 mg twice      daily as I suspect he is depleted.  4. Arrhythmic potential.  An implantable cardioverter-defibrillator is      in place.  No shocks.  5. Hypercholesterolemia:  Continue current therapy.     Salvadore Farber,  MD  Electronically Signed    WED/MedQ  DD: 09/24/2006  DT: 09/24/2006  Job #: [id number]   cc:   Corwin Levins, MD

## 2010-08-12 NOTE — Progress Notes (Signed)
[facility] OFFICE NOTE   CORBITT, CLOKE                       MRN:          [id number]  DATE:12/13/2006                            DOB:          06-30-1940    Mr. Lammert is seen for his ischemic heart disease, status post ICD  implantation for primary prevention.  He is doing well.  He is having  some chest pain last spring which resolves with more aggressive  diuresis.  And, he has had no further problems.  He has also had no  problems with lightheadedness after his Ismo was discontinued.   PHYSICAL EXAMINATION:  VITAL SIGNS:  His blood pressure is 128/62, pulse  is 58.  LUNGS:  Clear.  HEART:  Sounds were regular.  EXTREMITIES:  Without edema.  I should note also that his device pocket  is without erythema.   The patient awakened this morning with discomfort over the lateral  cephalad aspect of his device and he felt like he pinched something last  night when he rolled over in bed.   IMPRESSION:  1. Ischemic heart disease with depressed left ventricular function.  A      Myoview with anterior ischemia in June 2008.  2. Previously implanted ICD for primary prevention, Medtronic.  3. Defibrillator site discomfort, probably trauma without evidence of      infection.   I should note also that his Medtronic Maximow was interrogated today  with an R wave of 8.8, impedance of 600, a threshold of 1 volt at 0.2.  The battery voltage was 2.99.  He does have a 69/49 lead and that is  stable.   Mr. Njoku will be seen in 3 months' time in the device clinic.     Duke Salvia, MD, [facility]  Electronically Signed    SCK/MedQ  DD: 12/13/2006  DT: 12/13/2006  Job #: [id number]

## 2010-08-12 NOTE — Assessment & Plan Note (Signed)
[facility] OFFICE NOTE   DUELL, HOLDREN                       MRN:          [id number]  DATE:10/29/2006                            DOB:          [birth year]    PRIMARY CARE PHYSICIAN:  Dr. Oliver Barre.   INTERVAL HISTORY:  [name] is a delightful 70 year old male with a  history of coronary artery disease status post anterior myocardial  infarction and an emergent coronary artery bypass grafting in November  2003.  He also has congestive heart failure with an ejection fraction of  25% - 30%.  He was previously followed by Geralynn Rile and presents to  establish longterm care.  He has had somewhat of a difficult few weeks.  He states that he has been working outside in the heat and has been  sweating a lot.  He was previously started on Lasix but he was having  problems with low blood pressure.  He says he gets up in the morning,  blood pressure is usually about 120 and then after he takes his  lisinopril and his other medications his blood pressure will drop into  the 90s and he feels dizzy and lightheaded.  He has finally got to a  point where he has titrate his medications to where he feels better.  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   He now says he feels great.  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   CURRENT MEDICATIONS:  1. Coreg 25 b.i.d.  2. Aspirin 81 a day.  3. Prilosec 20 a day.  4. Lisinopril has been discontinued.  5. Simvastatin 80 a day.  6. Lasix 20 mg Tuesday, Wednesday and Thursday.  7. Magnesium oxide 400 mg b.i.d.   PHYSICAL EXAMINATION:  He is well-appearing, he ambulates around the  clinic without any respiratory difficulty.  Blood pressure is 132/70 in  his right arm, heart rate is 56, weight is 182 which is  down 5 pounds  from previous.  HEENT:  Normal.  NECK:  Supple, no JVD, carotids are 2+ bilaterally, there is a left  bruits.  There is no lymphadenopathy or thyromegaly.  CARDIAC:  He is regular and bradycardic, PMI is laterally displaced.  There is no murmurs or S3, soft S4.  ABDOMEN:  Soft, nontender, nondistended, no hepatosplenomegaly, no  bruits, no masses, good bowel sounds.  LUNGS:  Clear.  EXTREMITIES:  Warm with no cyanosis, clubbing, or edema.  NEURO:  Alert and oriented x3, cranial nerves II through XII are intact,  moves all 4 extremities without difficulty.  Affect is pleasant.   ASSESSMENT/PLAN:  1. Congestive heart failure secondary to ischemic cardiomyopathy.  He      is euvolemic.  New York Heart Association functional Class II.  We      will go ahead and try to start back his lisinopril at 2.5 mg b.i.d.  If this causes him problems I told him to take his dose only at      night.  He does have a defibrillator in place.  We will not start      spironolactone at this time as he has had problems with      hyperkalemia in the past and is only functional class II.  2. Coronary artery disease, this is stable.  He has had a recent      Myoview which showed an ejection fraction of 28% with only a very      mild peri-infarct ischemia in the anterior wall.  Continue medical      therapy.  3. Hyperlipidemia, continue simvastatin.  4. Left carotid bruit, we will check a carotid ultrasound.   DISPOSITION:  I will see him back in about 6 weeks for routine followup.     Bevelyn Buckles. Bensimhon, MD  Electronically Signed    DRB/MedQ  DD: 10/29/2006  DT: 10/29/2006  Job #: [id number]   cc:   Corwin Levins, MD

## 2010-08-12 NOTE — Progress Notes (Signed)
[facility]OC[facility]' OFFICE NOTE   TREMONT, GAVITT                       MRN:          [id number]  DATE:05/07/2008                            DOB:          [birth year]    Mr. Barefoot comes in today with a couple of complaints.  First, he  continues to cough and his granddaughter apparently asked him whether he  is going to cough up his defibrillator.  This has been an ongoing  problem for some months.   In addition, he has complaints of orthostatic intolerance.  This is  particularly noticeable after lunch.  He does have some hot meals, but  he has not noted whether this more closely related to hot meals or  colder meals.   He also has systolic hypertension and this has required a careful  adjustment of his medications.  These include Coreg 25 b.i.d.,  lisinopril 40 down to 20, aspirin, Prilosec, and furosemide 20.   PHYSICAL EXAMINATION:  VITAL SIGNS:  His blood pressure is 160/90, his  pulse was 57, his weight was 179, which is down 5 pounds since a month  or so ago.  LUNGS:  Clear.  NECK:  Veins were flat.  CARDIAC:  His heart sounds were regular without murmurs.  ABDOMEN:  Soft.  EXTREMITIES:  Without edema.   Interrogation of his Medtronic ICD demonstrates an R-wave of 7 with  impedance of 576, a threshold of 1 volt at 0.2.  Battery voltage was  2.69.   Last lipids included a LDL of 79 and HDL of 57.  His LFTs were normal.  These were repeated today.   IMPRESSION:  1. Ischemic cardiomyopathy with;      a.     Prior bypass.      b.     Ejection fraction of 25%.  2. Orthostatic intolerance.  3. Cough, question related to lisinopril versus infection.  4. Systolic hypertension.  5. Dyslipidemia.  6. Status post implantable cardioverter-defibrillator implantation      with 6949 lead.   Mr. Bond arrhythmia issues are stable.   I am bothered by his cough.  I wonder whether this is related to  his ACE  inhibitor, so we gave him a prescription today for Cozaar 50 to try as  an alternative.   As relates to his postprandial hypotension and orthostasis, I have given  him a prescription for ProAmatine to take 2.5 mg after lunch.  He will  also try to sort out whether his symptoms are worse after hot meal  versus a cold meal and further adjustments can be made as necessary.   He is reminded about 6949 lead.   We will see him again in 6 months' time.     Duke Salvia, MD, [facility]  Electronically Signed    SCK/MedQ  DD: 05/07/2008  DT: 05/08/2008  Job #: [id number]

## 2010-08-12 NOTE — Assessment & Plan Note (Signed)
[facility]                            [place] OFFICE NOTE   Mark Clayton, Mark Clayton                       MRN:          [id number]  DATE:12/10/2006                            DOB:          11/11/1940    PRIMARY CARE PHYSICIAN:  Corwin Levins, M.D.   INTERVAL HISTORY:  Mark Clayton is a delightful 70 year old male with a  history of coronary artery disease status post previous anterior wall  myocardial infarction and emergency coronary artery bypass grafting in  2003.  He also has a history of congestive heart failure due to an  ischemic cardiomyopathy with an ejection fraction of 25-30%.   At his last visit, we restarted his lisinopril at a low dose.  He had  previously had problems with light-headedness and dizziness, and it had  been stopped.  He has tolerated it quite well.  He has not noticed any  pre-syncope or dizziness.  He said he feels great.  He is walking 1 mile  a day at a good clip without any difficulty.  He denies any chest pain,  shortness of breath, orthopnea, or PND.  He does have an ICD in place  and this has not fired.   CURRENT MEDICATIONS:  1. Coreg 25 b.i.d.  2. Aspirin 81 a day.  3. Prilosec 20 a day.  4. Lisinopril 2.5 b.i.d.  5. Lasix 20 mg every day.  6. Simvastatin 80 mg a day.   PHYSICAL EXAM:  He is well-appearing.  He ambulates around the clinic  without any respiratory difficulty.  In no acute distress.  Blood pressure 158/72, heart rate is 58, weight 178, which is down 4  pounds.  HEENT:  Sclerae anicteric, EOMI.  There is no xanthelasma.  Mucous  membranes are moist.  NECK:  Supple.  There is no JVD.  There is soft carotid bruit on the  left.  CARDIAC:  PMI is not displaced.  He has a regular rate and rhythm.  No  murmurs, rubs, or gallops.  LUNGS:  Clear.  ABDOMEN:  Soft, nontender, nondistended.  No hepatosplenomegaly.  No  bruits.  No masses.  Good bowel sounds.  EXTREMITIES:  Warm with no cyanosis,  clubbing, or edema.  No rash.  NEURO:  He is alert and oriented x3.  Cranial nerves 2-12 are intact.  Moves all 4 extremities without difficulty.  Affect is pleasant.   ASSESSMENT AND PLAN:  1. Congestive heart failure secondary to ischemic cardiomyopathy.  He      is doing great.  He is euvolemic, New York Heart Association      functional class I.  We will go ahead and try and titrate his      lisinopril to 5 mg b.i.d.  We are holding off on spironolactone, as      he has had problems with hyperkalemia in the past and is now      functional class I.  2. Carotid bruit.  He had a recent ultrasound that showed 0-39%      blockages bilaterally.  We will continue to follow at a  distance.  3. Coronary artery disease.  This is stable.  Recent Myoview showed an      ejection fraction of 28% with only very mild peri-infarct ischemia      in the anterior wall.  4. Hyperlipidemia.  Continue simvastatin.  He is due for a fasting      lipid panel.     Bevelyn Buckles. Bensimhon, MD  Electronically Signed    DRB/MedQ  DD: 12/10/2006  DT: 12/10/2006  Job #: [id number]   cc:   Corwin Levins, MD

## 2010-08-12 NOTE — Assessment & Plan Note (Signed)
[facility] HEALTHCARE                            CARDIOLOGY OFFICE NOTE   Mark Clayton, Mark Clayton                       MRN:          [id number]  DATE:09/02/2006                            DOB:          08/19/1940    PRIMARY CARE PHYSICIAN:  Dr. Oliver Barre.   HISTORY OF PRESENT ILLNESS:  Mark Clayton is a 70 year old gentleman with  ischemic cardiomyopathy after an anterior myocardial infarction and  emergent bypass graft surgery in November of 2003. His ejection fraction  is approximately 25%.   Over the past 5 months, Mark Clayton has developed some new exertional  angina. This occurs with activities such as walking up a flight of  stairs, pushing a empty wheelbarrow up a hill, or carrying groceries. It  is manifested as a central chest and neck tightness with some associated  dyspnea. It relieves promptly with rest. He has not had any resting  pain. He has also noted some increased edema at the end of the day over  what had been his baseline for a longtime. He has not had any orthopnea,  PND, or resting dyspnea.   CURRENT MEDICATIONS:  1. Coreg 25 mg twice a daily.  2. Enteric coated aspirin 81 mg daily.  3. Prilosec OTC once daily.  4. Lisinopril 20 mg daily.  5. Simvastatin 80 mg daily.  6. Ismo  10 mg daily. He actually taking this on a p.r.n. basis.   PHYSICAL EXAMINATION:  He is generally well-appearing in no distress  with heart rate 58, blood pressure 150/70, and weight of 192 pounds.  Weight is up 4 pounds from November.  HEENT: Normal.  SKIN EXAM: Normal. He has jugular venous distension to 7 cm. There is no  thyromegaly and no lymphadenopathy.  Respiratory effort is normal.  LUNGS: Clear to auscultation.  He has a laterally displaced point of maximal cardiac impulse. There is  a regular rate and rhythm with normal S1, and S2. There is no murmur,  S3, or S4.  ABDOMEN: Soft, nondistended, nontender. There is no hepatosplenomegaly.  Bowel sounds  are normal.  EXTREMITIES: Warm without clubbing, cyanosis, edema, or ulceration.  Carotid pulses are 2 + bilaterally without bruits.   Electrocardiogram demonstrates sinus bradycardia at 58 beats per minute.  There is left ventricular hypertrophy with repolarization abnormality.  No change compared with prior.   IMPRESSION/RECOMMENDATIONS:  1. Stable angina: This is new. He thinks that the Ismo  has helped but      he is reluctant to take it on a regular basis as it makes him feel      more fatigued. We will check adenosine Cardiolite and then see him      back.  2. Chronic systolic heart failure: Now New York Heart Association      class 2 to 3. Will add aldactone 25 mg daily. Check BMET today.  3. Hypertension: Blood pressure up a bit. Hopefully the aldactone will      help.  4. Arrhythmic potential: ICD is in place. No shocks.  5. Hypercholesterolemia: Has not tolerated higher doses of Crestor. We  will therefore continue current regimen of Crestor and Zetia.     Salvadore Farber, MD  Electronically Signed    WED/MedQ  DD: 09/02/2006  DT: 09/02/2006  Job #: [id number]   cc:   Corwin Levins, MD

## 2010-08-12 NOTE — Assessment & Plan Note (Signed)
[facility]                            [place] OFFICE NOTE   [name], [name]                       MRN:          [id number]  DATE:10/28/2007                            DOB:          [birth year]    PRIMARY CARE PHYSICIAN:  Corwin Levins, MD.   INTERVAL HISTORY:  Mark Clayton is a delightful 70 year old male with a  history of coronary artery disease status post previous anterior wall  myocardial infarction and emergency coronary artery bypass graft in  2003, also has a history of congestive heart failure secondary to  ischemic cardiomyopathy with ejection fraction of 25-30%.  He is status  post single-chamber ICD.  He also has a history of hyperlipidemia and  minimal carotid artery stenosis.   He returns today for routine followup since his last visit.  He was seen  in the EP clinic and he was having some orthostasis.  We will cut his  lisinopril back from 40-20, he is much better now.  He denies any chest  pain, orthopnea, or PND.  He is walking about 2-1/2 half miles a day  without any problems.  About 2 weeks ago, he had an episode where he  woke up with palpitations.  He lasted for about 15 minutes and resolved.  He did not had any chest pain, he just felt funny.   CURRENT MEDICATIONS:  1. Lisinopril 20 a day.  2. Coreg 25 b.i.d.  3. Aspirin 81 a day.  4. Prilosec 20 a day.  5. Lasix 20 a day.  6. Tylenol and simvastatin 80 a day.   PHYSICAL EXAMINATION:  GENERAL:  He is a well-appearing.  Ambulates  around the clinic without any respiratory difficulty.  VITAL SIGNS:  Blood pressure initially was 120/64 and on follow up was  152/78; heart rate 58; and weight 184.  HEENT:  Normal.  NECK:  Supple.  No JVD.  Carotids are 2+ bilaterally.  There is a very  soft bruit in the left.  No lymphadenopathy or thyromegaly.  CARDIAC:  PMI is nondisplaced.  Regular rate and rhythm.  No murmurs, rubs, or  gallops.  No S3.  LUNGS:  Clear.  ABDOMEN:   Soft, nontender, and nondistended.  No hepatosplenomegaly.  No  bruits.  No masses.  Good bowel sounds.  EXTREMITIES:  Warm with no  cyanosis, clubbing, or edema.  No rash.  NEURO:  Alert and oriented x3.  Cranial nerves II-XII are intact.  Moves  all 4 extremities without difficulty.  Affect is pleasant.   LABORATORY STUDIES:  Labs show an LDL of 79, HDL of 57,  total  triglycerides of 154, and LFTs are normal.   ASSESSMENT AND PLAN:  1. Coronary artery disease, stable.  No evidence of ischemia.      Continue current therapy.  2. Congestive heart failure secondary to ischemic cardiomyopathy.  [place]      [place] Heart Association class I.  Volume status looks good.  He is      unable to tolerate further titration of his ACE inhibitor and beta-  blocker.  It was he who is due for his echocardiogram, which has      not been done in over 4 years.  3. Hyperlipidemia.  LDL is just about at goal.  Continue current      therapy.  4. Hypertension.  Blood pressure looks good.  He does have some white      coat hypertension, this is stable.  5. Carotid stenosis, this is mild.  We will follow every 2 years.   DISPOSITION:  We will bring him back next week for his echocardiogram  and also have him be seen in the EP Clinic for device interrogation to  sort out what his palpitations were.     Bevelyn Buckles. Bensimhon, MD  Electronically Signed    DRB/MedQ  DD: 10/28/2007  DT: 10/29/2007  Job #: [id number]

## 2010-08-12 NOTE — Progress Notes (Signed)
[facility]THMIA ASSOCI[facility]' OFFICE NOTE   Mark Clayton, Mark Clayton                       MRN:          [id number]  DATE:06/13/2007                            DOB:          1940-10-05    Mark Clayton is seen following pacemaker implantation for primary  prevention in the setting of ischemic heart disease.  He has done quite  well from a chest pain point of view.  However, he has had some  significant problems with dizziness and he was tracking his blood  pressure at home and noted that the dizziness correlates with blood  pressure systolics in the high 90s.   CURRENT MEDICATIONS:  1. Lisinopril 40 mg.  2. Coreg 25 mg b.i.d.  3. Aspirin.  4. Prilosec.  5. Furosemide.  6. Crestor.   EXAMINATION:  His blood pressure was 166/78 with a pulse of 68, although  he notes he did not take his medications this morning.  LUNGS:  Clear.  His heart sounds were regular.  ABDOMEN:  Soft.  EXTREMITIES:  Without edema.   Interrogation of his Medtronic Maximo ICD demonstrates an R wave of 7.5  with impedance of 560, a threshold of 1 V at 0.2, high voltage impedance  was 45 ohms.  Battery voltage was 2.95.  he has a 6949 lead.   IMPRESSION:  1. Ischemic heart disease.      a.     Prior myocardial infarction.      b.     Prior coronary artery bypass grafting.      c.     Ejection fraction 25-30%.  2. Status post implantable cardioverter-defibrillator for primary      prevention.  3. Symptomatic hypotension.  4. A 6949 lead.   Mark Clayton is doing well from a tachyarrhythmia point of view.  There is  no evidence of 6949 lead problems at this point.   His dizziness is concerning and I suspect that he will need less  medication and to that end, I have asked him to cut his lisinopril in  half from 40 to 20 mg daily.  It may well be that he will need to  quarter it so that he will need to halve it again, i.e., to a dose of 10  mg, if he  continues to have problems with modest hypotension and/or  orthostasis.   He is to let us know about that.  He will follow her with Dr. Gala Romney  as previously scheduled.  We will see him again in three months' time in  the device clinic.     Duke Salvia, MD, [facility]  Electronically Signed    SCK/MedQ  DD: 06/13/2007  DT: 06/14/2007  Job #: [id number]

## 2010-08-12 NOTE — Assessment & Plan Note (Signed)
[facility] OFFICE NOTE   Mark Clayton, Mark Clayton                       MRN:          [id number]  DATE:03/04/2007                            DOB:          1940/04/21    PRIMARY CARE PHYSICIAN:  Dr. Oliver Barre.   INTERVAL HISTORY:  Mark Clayton is a delightful 70 year old male with a  history of coronary artery disease, status post previous anterior wall  myocardial infarction and emergency coronary artery bypass grafting in  2003.  He has a history of congestive heart failure secondary to  ischemic cardiomyopathy with an ejection fraction of 25% - 30%.  He is  status post single chamber ICD.  He also has a history of hyperlipidemia  and minimal carotid artery stenosis.   He returns today for routine visit.  We have been titrate up his  lisinopril and he is now on 20 mg at night and tolerating this well.  The blood pressure is occasionally in the 90s, but otherwise no  problems.  This is asymptomatic.  He denies any orthopnea or PND.  He is  walking 1 hour three times a week at a fast pace without any problems,  on the other days he walks for 30 minutes without any problems.  He has  not had any ICD firings.  He has been eating a lot at a [employer].   CURRENT MEDICATIONS:  1. Coreg 25 b.i.d.  2. Aspirin 81.  3. Prilosec.  4. Lasix 20 a day.  5. Magnesium 400 b.i.d.  6. Lisinopril 20 a day.  7. Simvastatin 80 a day.   PHYSICAL EXAMINATION:  He is well-appearing, ambulates around the clinic  without any respiratory difficulty.  Blood pressure is 142/68, heart  rate is 54, weight is 182 which is stable.  HEENT:  Normal.  NECK:  Supple, there is no JVD, carotids are 2+ bilaterally.  There is a  very soft bruit on the left, no lymphadenopathy or thyromegaly.  CARDIAC:  PMI is nondisplaced, a regular rate and rhythm; no murmurs,  rubs or gallops.  LUNGS:  Clear.  ABDOMEN:  Soft, nontender, nondistended,  no hepatosplenomegaly, no  bruits, no masses, good bowel sounds.  EXTREMITIES:  Warm with no cyanosis, clubbing or edema, no rash.  NEURO:  Alert and oriented x3, cranial nerves II-XII are intact, moves  all 4 extremities without difficulty.  Affect is pleasant.   EKG shows sinus bradycardia at a rate of 54 with LVH and repolarization  abnormalities, no change from previous.  Total cholesterol is 220,  triglycerides 159, HDL 56 and LDL of 132.  Glucose is 67, LFTs are  normal.  Creatinine is 0.99.   ASSESSMENT/PLAN:  1. Coronary artery disease is stable, no evidence of ischemia,      continue current therapy.  2. Congestive heart failure secondary to ischemic cardiomyopathy, he      is doing great.  Volume status looks good, he is [place] Heart      Association Class I.  We will try to get him up to lisinopril  40 mg      at night as tolerated.  3. Hyperlipidemia, LDL goal is less than 70.  His lipids are markedly      elevated.  We will switch him over to Crestor 40 and I have told      him to be very careful about his diet.  4. Carotid artery stenosis, this is very mild.  He will need a      followup every 2 years.   DISPOSITION:  Will see him back in 4 months for followup.  We will get a  BMET in 1 week to make sure his potassium is stable with his increasing  dose of lisinopril.     Bevelyn Buckles. Bensimhon, MD  Electronically Signed    DRB/MedQ  DD: 03/04/2007  DT: 03/04/2007  Job #: [id number]   cc:   Corwin Levins, MD

## 2010-08-12 NOTE — Progress Notes (Signed)
[facility]                  [facility] OFFICE NOTE   DRAEDEN, KELLMAN                       MRN:          [id number]  DATE:06/18/2008                            DOB:          02-21-1941     I have seen him in followup for a cough and orthostatic dizziness from  month ago.  We stop his ACE inhibitor and his cough is resolved.  He on  his decreased his Coreg to once a day taking it at night and his  orthostatic hypertension has also gone.  He brings in a series of blood  pressure recordings, which are notable for being all between about 105  and 135, notwithstanding our documentation of 160s and plus.  He is  currently not on an ACE inhibitor because of the cough.  He is  also not  on an ARB because of the cost.  His Coreg, he is taking 12.5 just at  night.  He is also on aspirin, Prilosec, simvastatin and furosemide.   PHYSICAL EXAMINATION:  Today his blood pressure is little bit elevated,  142/72, his pulse was 64, his weight was 18,  which is about stable.  His neck veins were flat and his lungs are clear and heart sounds are  regular.   IMPRESSION:  1. Ischemic cardiomyopathy with  a  Prior bypass.  b.  Depressed left ventricular function.  1. Status post implantable cardioverter-defibrillator for the above      with a 6949 lead.  2. Orthostatic intolerance - Improved on medications as noted above.  3. Cough, improved off lisinopril.  4. Hypertension - Decently controlled at home.   Mr. Valent is doing really extremely well.  He was to ask the pharmacist  about cheaper ARB.  We will anxiously await the potential arrival of a  generic.   We will see him again as previously scheduled.     Duke Salvia, MD, [facility]  Electronically Signed    SCK/MedQ  DD: 06/18/2008  DT: 06/19/2008  Job #: [id number]   cc:   Corwin Levins, MD

## 2010-08-12 NOTE — Assessment & Plan Note (Signed)
[facility]                            [place] OFFICE NOTE   HEBERT, DOOLING                       MRN:          [id number]  DATE:03/19/2008                            DOB:          February 24, 1941    PRIMARY CARE PHYSICIAN:  Corwin Levins, MD   INTERVAL HISTORY:  Mark Clayton is a delightful 70 year old male with history  of coronary artery disease status post previous anterior wall myocardial  infarction and emergency coronary artery bypass grafting in 2003.  He  also has a history of congestive heart failure secondary to ischemic  cardiomyopathy, ejection fraction of 30-35% range.  He is status post  single-chamber ICD.  Remainder of his medical history is notable for  hyperlipidemia and minimal carotid artery stenosis.   He returns today for routine followup.  He is doing quite well.  He has  curbed his walking program a little bit due to the weather; however, he  has been walking about 2 miles a day.  He joined a gym today in  [place] and to continue his walking program.  He denies any orthopnea.  No PND.  No lower extremity edema.  He does note that every once in a  while his blood pressure dips low.  He recently had his ACE inhibitor  cut back by the [facility].  He says he feels much better; however, last  Sunday his blood pressure was 62/48.   CURRENT MEDICATIONS:  1. Coreg 25 b.i.d.  2. Prilosec.  3. Lasix 20 a day.  4. Simvastatin 80 a day.  5. Lisinopril 20 a day.  6. Aspirin 325 a day alternating with 81.   He is off his Coumadin.   PHYSICAL EXAMINATION:  GENERAL:  Well appearing in no acute distress.  He ambulates around the clinic without any respiratory difficulty.  VITAL SIGNS:  Blood pressure is 126/70, heart rate 65, weight is 185  which is stable.  HEENT:  Normal.  NECK:  Supple.  No JVD.  Carotids are 2+ bilaterally.  A very soft bruit  on the left.  No lymphadenopathy or thyromegaly.  CARDIAC:  PMI is nondisplaced.   Regular rate and rhythm.  No S3.  No  murmur.  LUNGS:  Clear.  ABDOMEN:  Soft, nontender, nondistended.  No hepatosplenomegaly.  No  bruits.  No masses.  Good bowel sounds.  EXTREMITIES:  Warm with no cyanosis, clubbing, or edema.  No rash.  NEUROLOGIC:  Alert and oriented x3.  Cranial nerves II through XII are  intact.  Moves all 4 extremities without difficulty.  Affect is  pleasant.   EKG shows sinus rhythm at a rate of 65 with LVH and repolarization  abnormalities.   ASSESSMENT AND PLAN:  1. Coronary artery disease.  This is stable.  No evidence of ischemia.      Continue current therapy.  2. Congestive heart failure secondary to ischemic cardiomyopathy.  He      is doing well.  NYHA class II.  Volume status looks good.  We will      not be able to further  titrate his ACE inhibitor due to his      intermittent hypotension.  I did tell him that if he continues to      have problems with low blood pressure, consider decreasing his      Lasix to every other day.  Alternatively, we could consider      switching his Lasix over to spironolactone.  3. Hyperlipidemia.  Goal LDL is less than 70.  We will check a lipid      panel next week.  4. Carotid artery stenosis.  This is very mild.  We will follow up      with carotid ultrasound as scheduled.     Bevelyn Buckles. Bensimhon, MD  Electronically Signed    DRB/MedQ  DD: 03/19/2008  DT: 03/20/2008  Job #: [id number]

## 2010-08-15 NOTE — Discharge Summary (Signed)
NAME:  Mark Clayton, Mark Clayton                          ACCOUNT NO.:  [id number]   MEDICAL RECORD NO.:  [id number]                   PATIENT TYPE:  OIB   LOCATION:  2021                                 FACILITY:  [facility]   PHYSICIAN:  Duke Salvia, M.D.               DATE OF BIRTH:  1940-04-18   DATE OF ADMISSION:  01/17/2003  DATE OF DISCHARGE:  01/18/2003                           DISCHARGE SUMMARY - REFERRING   PROCEDURE:  Medtronic ICD implantation on January 17, 2003.   REASON FOR ADMISSION:  Please refer to Dr. Salvatore Decent. Klein's admission note  of January 08, 2003, for the full details.   LABORATORY DATA:  None.  A chest x-ray revealed no pneumothorax (postoperatively).   HOSPITAL COURSE:  The patient presented for an elective defibrillator  implantation, performed by Dr. Graciela Husbands.  (See the operative report for the  full details), with the placement of a Medtronic Maximo ICD (serial  #[id number]).  There were no noted complications; however, the patient did  become combative and complained of chest pain, resulting in the  discontinuation of DFT testing.  The patient did return to the laboratory,  however, the following day for the completion of the testing, and  subsequently was cleared for discharge.   MEDICATION ADJUSTMENTS:  1. Down-titration of Toprol to 25 mg daily.  (The patient reported feeling     fatigued to Dr. Graciela Husbands.)  2. The initiation of Coreg 12.5 mg b.i.d.   DISCHARGE MEDICATIONS:  1. Coreg 12.5 mg b.i.d. (new).  2. Toprol 25 mg daily.  3. Crestor 20 mg daily.  4. Captopril 25 mg t.i.d.  5. Zetia 10 mg daily.  6. Aspirin 81 mg daily.   DISCHARGE INSTRUCTIONS:  The patient is to refrain from showering for one  week.  To keep the wound site clean and dry.  He is to gradually raise the  effected arm, as directed.  He is to call our office for any development of  swelling, bleeding, or pain from the wound site.   FOLLOW UP:  The patient is scheduled to follow up  with Dr. Graciela Husbands on January 30, 2003, at 3:30 p.m. for a wound check.   DISCHARGE DIAGNOSES:  1. Severe ischemic cardiomyopathy.     a. Status post Medtronic implantable cardioverter defibrillator        implantation on January 17, 2003.     b. Ejection fraction of less than 25%.     c. Status post coronary artery bypass graft surgery in November 2003.     d. Narrow QRS/positive T-wave alternan study.  2.     Compensated congestive heart failure.  3. Hypertension.  4. Dyslipidemia.      Gene Serpe, P.A. [facility]                      Duke Salvia, M.D.    GS/MEDQ  D:  01/18/2003  T:  01/18/2003  Job:  [id number]

## 2010-08-15 NOTE — Op Note (Signed)
[facility]  Patient:    Mark Clayton, Mark Clayton                       MRN: [id number] Proc. Date: 05/13/99 Adm. Date:  [id number] Attending:  Katha Cabal                           Operative Report  PREOPERATIVE DIAGNOSIS:  Right scrotal hernia.  POSTOPERATIVE DIAGNOSIS:  Right indirect inguinal hernia.  OPERATION PERFORMED:  Right inguinal herniorrhaphy with mesh.  SURGEON:  Thornton Park. Daphine Deutscher, M.D.  ANESTHESIA:  General by LMA.  DESCRIPTION OF PROCEDURE:  The patient was taken to room 3 at [facility] and given general anesthesia.  The abdomen was shaved and prepped with Betadine and draped sterilely.  He had a huge bulging visible mass in his scrotum.  I made an oblique incision in that region and carried this down to the external oblique which I exposed and then incised.  I preserved the ilioinguinal nerve branch with the inferior flap of his external oblique fascia.  I mobilized the cord and put a Penrose drain around it.  I went proximally and incised the cremasteric fibers and then dissected out a huge sac the size of my fist.  It contained small bowel.  I reduced all the small bowel and kept it reduced and then proceeded to strip the sac out from distally and go up proximally where I identified the neck and oversewed it with a running horizontal mattress suture f 2-0 silk.  This was tied off and then this was sent back up into the abdomen. he excess sac was excised and sent as a specimen.  Next, there was a large lipoma f the cord which I freed up from the cord structures and tucked this back down inside as well.  The internal ring was markedly dilated laterally.  I cut a piece of mesh, a similar sized piece and sutured it in the middle so that they were kind of connected but the peripheries were free.  I then tucked that beneath the fascia and attempted to go ahead and use that to close in a tension free  manner the internal ring.  I placed several horizontal mattress suture of 2-0 Prolene tightening down the inguinal ligament and then laterally and then superiorly.  When completed, I then cut a piece of mesh to fit and then sutured it along the  inguinal ligament with a running 2-0 Prolene and cut and let it go around the cord structures and sutured it to itself as well as to the other mesh with the 2-0 Prolene.  This was all tucked beneath the external oblique which was then closed with running 2-0 Vicryl.  The area was injected with Marcaine.  It was irrigated with saline and closed with 4-0 Vicryl and with 5-0 Vicryl subcuticularly, with  benzoin and Steri-Strips.  The patient seemed to tolerate the procedure well and was taken to the recovery room in satisfactory condition. DD:  05/13/99 TD:  05/13/99 Job: [id number]

## 2010-08-15 NOTE — Cardiovascular Report (Signed)
NAME:  Mark Clayton, Mark Clayton                          ACCOUNT NO.:  [id number]   MEDICAL RECORD NO.:  [id number]                   PATIENT TYPE:  INP   LOCATION:  1823                                 FACILITY:  [facility]   PHYSICIAN:  Charlies Constable, M.D. [facility]              DATE OF BIRTH:  1940-12-07   DATE OF PROCEDURE:  02/21/2002  DATE OF DISCHARGE:                              CARDIAC CATHETERIZATION   PROCEDURE PERFORMED:  Cardiac catheterization and percutaneous coronary  intervention.   CLINICAL HISTORY:  The patient is 70 years old and had no prior history of  known heart disease.  He developed symptoms of shortness of breath and some  chest pain over the past week prior to admission.  At 1 a.m. he had severe  chest pain that brought him to the emergency room.  His ECG showed diffuse  ST segment depression.  He was seen by Dr. Samule Ohm and given aspirin and  heparin and 300 mg of Plavix and Integrilin was ordered and he was  transferred to the catheterization laboratory.  He was still having 8/10  chest pain on arrival.   DESCRIPTION OF PROCEDURE:  The procedure was performed via the right femoral  artery using an arterial sheath and 6 French preformed coronary catheters.  A front wall arterial puncture was performed and Omnipaque contrast was  used. A distal aortogram was performed to evaluate the aorta for use of  intra-aortic balloon pump.  A 40 cc Datascope intra-aortic balloon pump was  placed via the right femoral artery.  The patient remained stable during the  procedure and his pain improved after placement of the balloon pump and  after medication.   RESULTS:  The left main coronary artery:  The left main coronary artery is  free of significant disease.   Left anterior descending:  The left anterior descending artery was  completely occluded near its origin.   Circumflex artery:  The circumflex artery gave rise to a large ramus branch  and a marginal branch and then was  completely occluded.  There was a second  marginal branch which filled via collaterals. The first large ramus branch  filled via collaterals.   Right coronary artery:  The right coronary is a large dominant vessel that  gave rise to a dual posterior descending system and four posterolateral  branches.  There was 80% and 50% stenosis in the mid vessel.  There was 95%  and 90% stenosis in each of the two posterior descending branches.  There  was 95% stenosis in the distal right before the four posterolateral  branches.  There was minimal collateral filling of the LAD from the right  coronary at the apex. There were also some collateral filling from the  distal circumflex from the right coronary artery.   LEFT VENTRICULOGRAM:  The left ventriculogram was performed in the RAO  projection showed akinesis of the anterolateral wall  and apex. There was  hypokinesis of the inferior wall.  The estimated ejection fraction was 15%.   DISTAL AORTOGRAM:  A distal aortogram was performed which showed patent  renal arteries and no significant aortoiliac obstruction.   The aortic pressure was 107/78 with a mean of 92 and left ventricular  pressure was 107/35.   CONCLUSIONS:  Severe three-vessel coronary artery disease with total  occlusion in the left anterior descending artery, total occlusion of the  ramus branch in the circumflex artery, total occlusion of the mid circumflex  artery, 80% mid stenosis in the right coronary artery with 95% and 90%  stenosis in the two posterior descending branches and 95% stenosis in the  distal right coronary artery before four posterolateral branches and  anterolateral wall and apical wall akinesis and inferior wall hypokinesis  with an estimated ejection fraction of 15%.   RECOMMENDATIONS:  The patient is having a non-ST segment elevation  myocardial infarction and my have had a previous anterior wall infarction  out of the hospital.  He has severe three-vessel  disease which is not at all  favorable for percutaneous intervention.  His operative risks will be high  because of severe left ventricular dysfunction and emergent nature of his  situation, but I think this will be the best option.  Dr. Laneta Simmers has been  consulted and plans are being made to take the patient emergently to the  operating room.                                                    Charlies Constable, M.D. [facility]    BB/MEDQ  D:  02/21/2002  T:  02/21/2002  Job:  [id number]   cc:   Salvadore Farber, M.D. [facility]  [address] [place], [place] [address]  Fax: 1   Cardiopulmonary Laboratory

## 2010-08-15 NOTE — Assessment & Plan Note (Signed)
[facility]                         ELECTROPHYSIOLOGY OFFICE NOTE   SEBRON, MCMAHILL                       MRN:          [id number]  DATE:05/14/2006                            DOB:          18-Jan-1941    Mr. Kamara comes in status post ICD implantation as per the Master trial  for LV dysfunction which is secondary to ischemic heart disease.   He has been having a number of problems, the first is that he has  developed myalgias.  This occurred temporally, related to his stopping  his Crestor and Zetia and being put on Zocor. He stopped the Zocor for a  day or 2 and myalgias got better. He has resumed it as of last night. He  has not had recurrent myalgias today.   The next issue has been that he has a tightness in his chest when he  begins walking, whether it is the morning or the evenings, it lasts for  a few minutes, he has to stop, regroup before he can proceed.   CURRENT MEDICATIONS:  Coreg 25, aspirin, Prilosec and Lisinopril.   PHYSICAL EXAMINATION:  VITAL SIGNS: His blood pressure was elevated  today at 154/81, his pulse was 61.  LUNGS: Clear.  CARDIAC: Heart sounds were regular.  LOWER EXTREMITIES: Without edema.   INTERROGATION OF HIS MEDTRONIC 72 ICD:  Demonstrates an R wave of 13.4  with impedance of 624, threshold of  1 volt 0.2 with a high volt  impedance of 52, about equal to 3.04 on two nonsustained episodes.   IMPRESSION:  1. Ischemic heart disease.      a.     Status post myocardial infarction.      b.     Ejection fraction of 25%.      c.     Status post CABG.      d.     Recently notable chest discomfort.  2. Myalgias, question related to his Statins.   Mr. Banik has a couple of things that are concerning. I am not quite  sure what to make of his chest pain with the beginning of exertion and  relieves.  It may be some pre-conditioning.  I am going to go ahead and give him a  prescription for nitroglycerin to take prior  to walking to see if it has  an impact.  I have also given him a prescription for Ismo 20 to take if  we can demonstrate effectiveness with the initial intervention.   He is to call us next week and at that point depending on the response  to therapy, will decide whether we should undertake a repeat Myoview  scan the threshold for which should be very low.   In addition I have told him to continue taking the Zocor but if he  develops recurrent myalgias, to discontinue Zocor and resume his  Crestor.   He will be following up with Dr. Samule Ohm. I will be following his device  remotely but he will let us know next week how he is doing with the  chest pain issue.  Duke Salvia, MD, [facility]  Electronically Signed    SCK/MedQ  DD: 05/14/2006  DT: 05/14/2006  Job #: [id number]   cc:   Salvadore Farber, MD

## 2010-08-15 NOTE — Op Note (Signed)
NAME:  Mark Clayton, Mark Clayton                          ACCOUNT NO.:  [id number]   MEDICAL RECORD NO.:  [id number]                   PATIENT TYPE:  INP   LOCATION:  2313                                 FACILITY:  [facility]   PHYSICIAN:  Evelene Croon, M.D.                  DATE OF BIRTH:  02/05/1941   DATE OF PROCEDURE:  02/21/2002  DATE OF DISCHARGE:                                 OPERATIVE REPORT   PREOPERATIVE DIAGNOSIS:  Severe three-vessel coronary artery disease with  unstable angina and acute myocardial infarction.   POSTOPERATIVE DIAGNOSES:  Severe three-vessel coronary artery disease with  unstable angina and acute myocardial infarction.   OPERATIVE PROCEDURE:  Emergency median sternotomy, extracorporeal  circulation, emergency coronary artery bypass graft surgery x6 using a left  internal mammary artery graft to the left anterior descending coronary  artery, with a sequential saphenous vein graft to the intermediate coronary  artery, the obtuse marginal coronary artery, and the second posterolateral  branch of the right coronary artery, and a sequential saphenous vein graft  to the posterior descending and first posterolateral branch of the right  coronary artery.  Endoscopic vein harvesting from the right and left thighs.   ATTENDING SURGEON:  Evelene Croon, M.D.   ASSISTANT:  Levin Erp. Steward, P.A.-C.   ANESTHESIA:  General endotracheal.   CLINICAL HISTORY:  This patient is a 70 year old gentleman without  significant past medical history who presented with an 8-day history of  episodic chest pain and shortness of breath.  At about 1 a.m. today, he  awoke with severe stuttering chest pain that did not resolve.  Electrocardiogram showed diffuse ST depression, and his enzyme levels were  positive.  Cardiac catheterization by Dr. Juanda Chance showed severe three-vessel  disease with 100% LAD occlusion with faint filling of the distal vessel via  collaterals.  There was 100% intermediate  occlusion.  There was 100% left  circumflex after the first marginal branch.  The right coronary artery had  diffuse disease with 80% midvessel stenosis and 95% posterior descending  stenosis. There was also about 90% stenosis in the first posterolateral and  95% compromise in a second posterolateral.  Ejection fraction was 16% with  anterior akinesis and inferior hypokinesis.  The patient continued to have  some chest pain in the catheterization lab with electrocardiographic changes  and had insertion of an intra-aortic balloon pump by Dr. Juanda Chance with relief  of his pain.  After review of the angiogram and examination of the patient  in the catheterization lab, it was felt that emergency coronary artery  bypass graft surgery was the best treatment to prevent further ischemia and  infarction.  I discussed the operative procedure with the patient and his  wife, including alternatives, benefits, and risks, including bleeding, blood  transfusion, infection, stroke, graft failure, myocardial infarction, and  death.  They understood and agreed to proceed.   OPERATIVE  PROCEDURE:  The patient was taken to the operating room and placed  on the table in the supine position.  After induction of general  endotracheal anesthesia, a Foley catheter was placed in the bladder using a  sterile technique.  Then, the chest, abdomen, and both lower extremities  were prepped and draped in the usual sterile manner.  The chest was entered  through a median sternotomy incision and the pericardium off the midline.  A  transesophageal echocardiogram was performed by anesthesiology and showed  akinesis of the anterior wall.  There was to be a hypokinesis of the  inferior and lateral walls.  There was no mitral regurgitation. There was no  aortic valve disease.  The ascending aorta had no palpable plaques in it.   Then, the left internal mammary artery was harvested from the chest wall as  a pedicle graft.  This was  a medium-caliber vessel with excellent blood flow  through it.  At the same time, a segment of the greater saphenous vein was  harvested from both thighs using endoscopic vein harvest technique.  A vein  had to be harvested from both thighs because at approximately the knee  level, it bifurcated into two small vein branches on each side, neither of  which was suitable.   Then, the patient was heparinized.  When an adequate activated clotting time  was achieved, the distal aorta was cannulated using a 20-French aortic  cannula for arterial inflow.  Venous outflow was achieved using a two-stage  venous cannula through the right atrial appendage. An antegrade cardioplegia  and bent cannula were inserted in the aortic root.   The patient was placed on cardiopulmonary bypass, and the distal coronary  was identified.  Then, a retrograde cardioplegia cannula was inserted  through the right atrium into the coronary sinus.  The aorta was cross  clamped, and 500 cc of cold blood antegrade cardioplegia was administered in  the aortic root with quick arrest of the heart.  This was followed by 500 cc  of cold retrograde cardioplegia.  Systemic hypothermia to 20 degrees  centigrade and topical hypothermic iced saline was used.  A temperature  probe was placed in septum and inside and behind the pericardium.   The first distal anastomosis was performed to the intermediate coronary  artery.  The internal diameter was about 1.75 mm.  The conduit used was a  segment of greater saphenous vein.  The anastomosis was performed in a  sequential side-to-side manner using continuous 7-0 Prolene suture.  Flow  was administered through the graft and was excellent.   The second distal anastomosis was performed at the second obtuse marginal  branch.  The internal diameter of this vessel was about 1.5 mm.  The conduit used was a segment of greater saphenous vein.  The anastomosis was performed  in a sequential  side-to-side manner using continuous 7-0 Prolene suture.  Flow was measured through the graft and was excellent.   A third distal anastomosis was performed at the second posterolateral branch  of the right coronary artery.  The internal diameter was 1.75 mm.  The  conduit used was a segment of the greater saphenous vein, and the  anastomosis was performed in a sequential end-to-side manner using  continuous 7-0 Prolene suture.  Flow was measured through the graft and was  excellent.  Then, another dose of retrograde cardioplegia was given.   The fourth distal anastomosis was performed on the posterior descending  coronary artery.  This artery had to be grafted fairly distally due to  midvessel stenosis.  The internal diameter was 1.6 mm.  The conduit used was  a segment of greater saphenous vein, and the anastomosis was performed in a  sequential side-to-side manner using continuous 7-0 Prolene suture.  Flow  was noted through the graft and was excellent.   The fifth distal anastomosis was noted to be formed to the first  posterolateral branch of the right coronary artery.  The internal diameter  of this vessel was 1.6 mm.  The conduit used was a segment of the greater  saphenous vein.  The anastomosis was performed in a sequential end-to-side  manner using continuous 7-0 Prolene suture.  Flow was administered through  the graft and was excellent.  Then, another dose of retrocardioplegia was  given.   The sixth distal anastomosis was performed to the midportion of the left  anterior descending coronary artery.  This was a relatively small vessel  with a internal diameter of about 1.5 mm.  The conduit used was the left  internal mammary graft, and this was brought through an opening in the left  pericardium anterior to the phrenic nerve.  It was anastomosed to the LAD in  an end-to-side manner using continuous 8-0 Prolene suture.  The pedicle was  tacked to the epicardium with 6-0  Prolene sutures.  The patient was rewarmed  to 37 degrees centigrade, and the clamp was removed from the mammary  pedicle.  There was rapid rewarming of the ventricular septum and return of  spontaneous ventricular fibrillation.  The cross clamp was removed with a  time of 79 minutes, and the patient spontaneously converted to a sinus  rhythm.   A partial occlusion clamp was placed on the aortic root, and the two  proximal vein graft anastomoses were performed in an end-to-side manner  using continuous 6-0 Prolene suture.  The clamp was removed.  The proximal  and distal anastomosis appeared hemostatic.  Graft markers were placed on  the proximal anastomosis.  Two temporary right ventricular and right atrial  paced wires were placed through the skin.   The patient was rewarmed to 37 degrees centigrade.  He was weaned from  cardiopulmonary bypass on dopamine and milrinone, as well as Levafed for vasoconstriction and an intra-aortic balloon pump at 1:1.  Cardiac function  appeared improved with a cardiac output of 6 liters a minute.  A  transesophageal echocardiogram was performed.  The anterior wall continued  to appear akinetic.  There was better contractility of the inferior and  lateral walls.  There was no mitral regurgitation.  Protamine was given, and  the venous and aortic cannulas were removed without difficulty. Hemostasis  was achieved.  Platelets were given since the patient was given Plavix and  Integrilin preop.  Hemostasis was achieved.  Three chest tubes were placed  with two in the posterior pericardium, one in the left pleural space, and  one in the antrum.  The pericardium was loosely reapproximated over the  aorta.  The sternum was reapproximated with #6 stainless steel wires.  The  fascia was closed with continuous #1 Vicryl suture.  The subcutaneous tissue  was closed with continuous 2-0 Vicryl and the skin with 3-0 Vicryl  subcuticular closure.  The lower extremity  harvest sites were closed in  layers in a similar manner.  The sponge, needle, and instrument counts were  correct.  Dry, sterile dressings were applied over the incisions and around  the chest tubes.  The patient remained hemodynamically stable and was  transported to the SICU in a guarded but stable condition.                                               Evelene Croon, M.D.    BB/MEDQ  D:  02/21/2002  T:  02/22/2002  Job:  [id number]   cc:   Charlies Constable, M.D. [facility]  [address]  [place]  [place] [address]   Cardiac Catheterization Laboratory  [facility]

## 2010-08-15 NOTE — Assessment & Plan Note (Signed)
[facility]                              CARDIOLOGY OFFICE NOTE   Mark Clayton, Mark Clayton                       MRN:          [id number]  DATE:02/22/2006                            DOB:          [birth year]    PRIMARY CARE PHYSICIAN:  Mark Levins, MD.   HISTORY OF PRESENT ILLNESS:  Mark Clayton is a 70 year old gentleman with an  ischemic cardiomyopathy after an anterior myocardial infarction and emergent  bypass graft surgery in November of 2003.  His ejection fraction was  approximately 25%.   He continues to do very nicely.  He is working as a Paediatric nurse three days per  week and walking on a daily basis.  He recently built a chicken coop for his  daughter.  He was able to do all this without any chest discomfort,  exertional dyspnea, paroxysmal nocturnal dyspnea or orthopnea.  He gets some  minimal edema after a long day cutting hair.  He has had no ICD discharges  and no palpitations or syncope.   CURRENT MEDICATIONS:  1. Coreg 25 mg twice a day.  2. Crestor 20 mg per day.  3. Zetia 10 mg per day.  4. Enteric coated aspirin 81 mg per day.  5. Prilosec OTC.  6. Lisinopril 20 mg per day.   PHYSICAL EXAMINATION:  GENERAL APPEARANCE:  He is generally well-appearing  in no distress.  VITAL SIGNS:  Heart rate 71, blood pressure 118/70 and weight 188 pounds.  HEENT:  Normal.  SKIN:  Normal.  NECK:  He has no jugular venous distension, no thyromegaly.  LUNGS:  Respiratory effort is normal.  Lungs are clear to auscultation.  CARDIOVASCULAR:  He has a nondisplaced point of maximal cardiac impulse.  There is a regular rate and rhythm with normal S1 and S2.  No murmur, S3 or  S4.  ABDOMEN:  Soft, nondistended, nontender.  There is no hepatosplenomegaly.  Bowel sounds are normal.  EXTREMITIES:  Warm and without edema.  VASCULAR:  Carotid pulses 2+ without bruit.   Electrocardiogram demonstrates normal sinus rhythm and left ventricular  hypertrophy with  repolarization abnormality.  No change compared with a year  ago.   IMPRESSION/RECOMMENDATIONS:  1. Coronary artery disease:  Asymptomatic.  Prior infarct.  Continue      aspirin, beta-blocker and ACE inhibitor.  2. Ischemic cardiomyopathy:  Remains New York Heart Association Class II.      Continue current medical regimen including ACE inhibitor and beta-      blocker.  3. Hypertension, nicely controlled.  4. Arrhythmia potential:  Implantable cardioverter defibrillator in place.      No shocks.  5. Hypercholesterolemia:  Has not tolerated higher doses of Crestor.  Will      therefore continue current regimen of Crestor and Zetia.  Recheck in      six months.     Mark Farber, MD  Electronically Signed    WED/MedQ  DD: 02/22/2006  DT: 02/22/2006  Job #: [id number]   cc:   Mark Levins, MD

## 2010-08-15 NOTE — Discharge Summary (Signed)
NAME:  Mark Clayton, Mark Clayton                          ACCOUNT NO.:  [id number]   MEDICAL RECORD NO.:  [id number]                   PATIENT TYPE:  INP   LOCATION:  2005                                 FACILITY:  [facility]   PHYSICIAN:  Evelene Croon, M.D.                  DATE OF BIRTH:  Sep 21, 1940   DATE OF ADMISSION:  02/21/2002  DATE OF DISCHARGE:  02/26/2002                                 DISCHARGE SUMMARY   ADMITTING DIAGNOSIS:  Acute coronary syndrome.   PAST MEDICAL HISTORY:  Significant for right inguinal hernia repair in 2001.   ALLERGIES:  This patient has no known drug allergies.   DISCHARGE DIAGNOSIS:  Severe three-vessel coronary artery disease with  unstable angina and acute myocardial infarction, status post emergent  coronary artery bypass graft.   BRIEF HISTORY:  The patient is a 70 year old gentleman without a prior  history of coronary artery disease.  Eight days prior to admission, he had  intermittent substernal chest pain associated with shortness of breath.  He  was seen by his primary care Zasha Belleau several days prior to admission, was  given a prescription for asthma.  At 1 a.m. on the morning of 11/25, he  developed acute onset of 7/10 substernal chest pain radiating to his left  hand.  This was associated with shortness of breath, rapid palpitations, and  diaphoresis.  He sought medical care at the [facility].   HOSPITAL COURSE:  The patient was evaluated at [facility]  Department by Dr. Randa Evens of [facility] group.  An EKG  reveals diffuse ST depressions.  His 7/10 chest pain improved initially  after some nitroglycerin but subsequently worsened to 10/10.  Dr. Melinda Crutch  impression was that the patient has an acute coronary syndrome, and his  recommendation was to proceed urgently to cardiac catheterization lab.  The  patient underwent emergent cardiac catheterization by Dr. Charlies Constable.  His  findings included  severe three-vessel coronary artery disease with total  occlusion of the left anterior descending artery as well as the ramus branch  and the mid-circumflex artery.  His lesions were not amenable to PCI.  Therefore, he requested cardiac surgery consultation.  The patient was  evaluated by Dr. Laneta Simmers in the catheterization lab.  After examination of  the patient and review of the catheterization films, Dr. Laneta Simmers agreed that  surgical intervention was the preferred treatment for this gentleman.  Procedure risks and benefits were discussed, and the patient agreed to  proceed.   The patient underwent emergent coronary artery bypass grafting by Dr. Evelene Croon.  Grafts placed at the time of procedure were the left internal  mammary artery graft to the left anterior descending artery, saphenous vein  grafted in sequential fashion to the intermediate, obtuse marginal, and  second posterolateral branch of the right coronary artery, saphenous vein  grafted in sequential  fashion to the posterior descending, and first  posterolateral branch of the right coronary artery.  Vein was harvested from  bilateral thighs via the endoscopic vein harvesting technique.  The intra-  aortic balloon pump was inserted while the patient was in the cardiac  catheterization lab.  This was restarted after coronary bypass.  Transesophageal echocardiography was performed in the operating room, and  his ejection fraction was estimated to be 15%, no mitral regurgitation.  There was anterior akinesis.  The patient tolerated this procedure  reasonably well and was transferred in stable condition to the SICU.   The patient remained hemodynamically stable after surgery.  He was extubated  within the first several hours after surgery, and his intra-aortic balloon  pump was removed on postoperative day #1.  The patient's postoperative  course has been uneventful.   POSTOPERATIVE ISSUES:  Problem 1.  Volume overload.  This  is responding to  diuretics; however, he is still approximately 15 pounds over his  preoperative weight.  He will require diuretics after discharge.   Problem 2.  On postoperative day #3, he was noted to have significant edema  and ecchymosis of his left thigh.  Because he had a catheter in his left  groin preoperatively (IAVP) as well as a left thigh endovein harvest, Dr.  Laneta Simmers felt it was prudent to proceed with venous Doppler study to rule out  DVT.  This was performed on 11/28, and this was negative for DVT,  superficial thrombosis, or Baker's cyst.   Problem 3.  Because of his preoperative MI, ACE inhibitor was initiated on  11/23.  This was Captopril 6.25 mg p.o. q.8h.   Problem 4.  On the morning of postoperative day #4, the patient was noted to  have evidence of thrush.  He was started on Magic Mouthwash q.i.d.   The patient was making good progress in recovering from his surgery.  On the  morning of 11/29, he reports that he feels well except for his sore mouth.  His heart has remained in normal sinus rhythm.  His blood pressure is  118/71.  He is a febrile.  His lungs are clear.  He is tolerating a regular  diet.  He has had a bowel movement, and he is urinating well.  His pain is  adequately controlled, and he is ambulating with assistance in the hallway.  If the patient continues to progress, it is expected he will be discharged  home in the next 24-48 hours.   RECENT LABORATORY STUDIES:  On 11/29, sodium 139, potassium 4, BUN 12,  creatinine 1, glucose 128.  On 11/28, CBC revealed a white blood cell count  of 11.7, hemoglobin 10.1, hematocrit 29.7, platelets 133,000.   CONDITION ON DISCHARGE:  Improved.   INSTRUCTIONS ON DISCHARGE:  Instruction 1.  Medications:  Tylox 1-2 p.o. q.4-  6h. p.r.n. for moderate-to-severe pain or Tylenol 1-2 p.o. q.4-6h. p.r.n.  for mild pain.  Enteric-coated aspirin 325 mg p.o. q.d. (that is a new dose for him).  Toprol-XL 25 mg p.o. q.d.   Captopril 12.5 mg one half tablet  t.i.d. (8 a.m., 4 p.m., and 10 p.m.).  Lasix 40 mg p.o. q.d. x10 days.  Potassium chloride 20 mEq p.o. q.d. x10 days.  Niferex 150 mg p.o. q.d.  Magic Mouthwash 15 cc swish and swallow q.i.d.   Instruction 2.  Activity.  He is advised to do no driving, heavy lifting,  pushing or pulling.  He has been instructed to continue  his breathing  exercises and daily walking.   Instruction 3.  Diet.  His diet should be a low-fat, low-salt diet.   WOUND CARE:  He may shower with mild soap and water.  He has been asked to  inspect his incisions daily.  If they become red, hot, swollen, draining, or  he has a fever greater than 101 degrees Fahrenheit, he is to call Dr.  Sharee Pimple office.   FOLLOW UP:  1. [facility] in approximately two weeks.  He will have a chest x-     ray that day.  He has been asked to     call the office to arrange this appointment.  2. He will have an appointment to see Dr. Laneta Simmers at the [facility] office in     approximately two weeks.  The office will call with the date and time for     that appointment.     Toribio Harbour, R.N.                  Evelene Croon, M.D.    CTK/MEDQ  D:  02/25/2002  T:  02/25/2002  Job:  [id number]   cc:   Salvadore Farber, M.D. [facility]  [address] [place], [place] [address]  Fax: 1   [facility]

## 2010-08-15 NOTE — H&P (Signed)
NAME:  Mark Clayton, Mark Clayton                          ACCOUNT NO.:  [id number]   MEDICAL RECORD NO.:  [id number]                   PATIENT TYPE:  INP   LOCATION:  1823                                 FACILITY:  [facility]   PHYSICIAN:  Salvadore Farber, M.D. [facility]         DATE OF BIRTH:  [birth year]   DATE OF ADMISSION:  02/21/2002  DATE OF DISCHARGE:                                HISTORY & PHYSICAL   CHIEF COMPLAINT:  Chest pain.   HISTORY OF PRESENT ILLNESS:  The patient is a 70 year old gentleman without  a prior history of coronary artery disease. His cardiac risk factors include  age and sex only. For the past eight days he has had intermittent substernal  chest pain associated with shortness of breath. These episodes have recurred  primarily at rest and have not clearly been associated with exertion. He saw  his primary care physician several days ago and was given a prescription for  asthma medication. At 1 o'clock this morning he developed the relatively  acute onset of 7/10 substernal chest pain radiating to his left hand  associated with modest shortness of breath. He has had some rapid  palpitations. He has also had associated diaphoresis.   He presented to  the [facility] Emergency Department at 7:40 this  morning. An electrocardiogram demonstrated diffuse  ST depressions. He  initially had 7/10 chest pain which improved after nitroglycerin, but  subsequently worsened to 10/10 again.   PAST MEDICAL HISTORY:  Status post right inguinal hernia repair in 2001.   CURRENT MEDICATIONS:  1. Aspirin 81 mg q.d.  2. B12 vitamin q.d.   ALLERGIES:  No known drug allergies.   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 He denies tobacco and  alcohol use.   FAMILY HISTORY:  His father suffered a myocardial infarction at age 74. His  mother died at 76 of Alzheimer's disease.   REVIEW OF SYSTEMS:  Remarkable for wearing glasses and a cataract in his  left eye. Also   notable for occasional gastric upset over the past several  years, clearly distinct from his current symptomatology. Review of systems  is  otherwise negative in detail.   PHYSICAL EXAMINATION:  GENERAL:  He is an ill appearing man in modest  distress.  VITAL SIGNS:  Heart rate 110 beats per minute, blood pressure 134/82, O2  saturation 96% on 2 liters by nasal cannula.  NECK:  No jugular venous  distention. Carotid pulses are 2+ bilaterally  without bruits.  LUNGS:  Clear to auscultation and percussion bilaterally.  HEART:  He is tachycardic with regular rhythm. There is no murmur or S3. S4  is heard.  ABDOMEN:  Soft, nontender, nondistended, no hepatosplenomegaly. Bowel sounds  are normal.  EXTREMITIES:  Warm without cyanosis, clubbing or edema or ulceration.  Femoral pulses are 1+ bilaterally without bruits.  Dorsalis pedis pulses are  2+ bilaterally.  LABORATORY DATA:  An electrocardiogram demonstrates sinus tachycardia at 130  beats per minute. There are 3 to 4 mm downsloping ST depressions in leads V3  to V6, 1, 2, 3 and AVF.  There is evidence of both left ventricular and left  atrial enlargement.   LABORATORY DATA:  The majority are currently pending. Potassium 4.6, glucose  166, creatinine 0.7.   IMPRESSION:  The patient is clearly having  an acute coronary syndrome. He  is  currently hemodynamically stable, but tachycardia suggests a substantial  myocardium at risk. Will proceed urgently to the cardiac catheterization  laboratory. In the emergency room he has been given aspirin, 5 mg of IV  Lopressor, 300 mg of Plavix, IV heparin. Eptifibatide has been ordered. The  patient is en route to the catheterization laboratory.                                                Salvadore Farber, M.D. [facility]    WED/MEDQ  D:  02/21/2002  T:  02/21/2002  Job:  [id number]

## 2010-08-15 NOTE — Op Note (Signed)
   NAME:  Mark Clayton, Mark Clayton                          ACCOUNT NO.:  [id number]   MEDICAL RECORD NO.:  [id number]                   PATIENT TYPE:  INP   LOCATION:  2313                                 FACILITY:  [facility]   PHYSICIAN:  Sheldon Silvan, M.D.                   DATE OF BIRTH:  Jul 29, 1940   DATE OF PROCEDURE:  02/21/2002  DATE OF DISCHARGE:                                 OPERATIVE REPORT   PROCEDURE PERFORMED:  Intraoperative transesophageal echocardiography (TEE).   The patient was brought to the operating room emergently by Dr. Laneta Simmers for  coronary artery bypass grafting.  It was felt that evaluation and monitoring  using TEE would be appropriate intraoperatively for him.   He was brought to the operating room and induced with general anesthesia.  His trachea was intubated.  The [employer] OmniPlane TEE probe was  passed through the oropharynx uneventfully.  Good views of the heart were  obtained.   The left ventricle was imaged and found to be moving poorly in the anterior  and lateral portions.  There were multiple segmental wall motion  abnormalities with hypokinesis in these areas.   The papillary muscles appeared intact.  The mitral valve appeared normal and  had only 1+ mitral regurgitation  on Color Flow exam.  The aortic valve was  imaged and found to be tricuspid with good apposition of the cusp.  On Color  Flow exam there was trace to 1+ regurgitation.  No stenosis was noted.   The left atrial appendage was briefly imaged  and was thought to be free of  smoke or clot.   The interatrial septum was imaged and there were no Color Flow points of any  patent foramen ovale.  The right ventricle  and tricuspid valve appeared  normal.   The patient was placed on cardiopulmonary bypass.  Coronary artery bypass  grafting was performed by Dr. Laneta Simmers.   On weaning the patient from bypass, the heart was re-examined and there was  some small improvement in the hypokinetic  areas in the ventricle, both  anteriorly and laterally.  However, the patient did require pressor agents  to be weaned from the pump.  He had had a low ejection fraction of between  10 and 15% prior to the operation.   The valvular structures were unchanged post pump.  The patient was  transported to the SICU after removal of the probe, uneventfully.                                               Sheldon Silvan, M.D.    DC/MEDQ  D:  02/23/2002  T:  02/23/2002  Job:  [id number]

## 2010-08-15 NOTE — Op Note (Signed)
NAME:  Mark Clayton, Mark Clayton                          ACCOUNT NO.:  [id number]   MEDICAL RECORD NO.:  [id number]                   PATIENT TYPE:  OIB   LOCATION:  2855                                 FACILITY:  [facility]   PHYSICIAN:  Duke Salvia, M.D.               DATE OF BIRTH:  1941-03-16   DATE OF PROCEDURE:  01/17/2003  DATE OF DISCHARGE:                                 OPERATIVE REPORT   PREOPERATIVE DIAGNOSIS:  Ischemic heart disease, prior myocardial  infarction, ejection fraction of less than 20% (Master study).   POSTOPERATIVE DIAGNOSIS:  Ischemic heart disease, prior myocardial  infarction, ejection fraction of less than 20% (Master study).   PROCEDURE:  Implantation of a single-chamber defibrillator with  intraoperative defibrillation threshold testing.   SURGEON:  Duke Salvia, M.D.   DESCRIPTION OF PROCEDURE:  After the attainment of informed consent, the  patient was brought to the electrophysiology laboratory and placed on the  fluoroscopy table in supine position.  After routine prep and drape to the  left upper chest, lidocaine was infiltrated in the prepectoral/subclavicular  region and an incision was made and carried down to the layer of the  prepectoral fascia using electrocautery.  A pocket was formed with  electrocautery and blunt dissection.  Hemostasis was obtained.  Thereafter,  attention was turned to gaining access to the extrathoracic left subclavian  vein, which was accomplished without difficulty, without the aspiration of  air or puncture of the artery.  A single venipuncture was accomplished and a  guidewire was placed and retained and a 0 silk suture was placed in a figure-  of-eight fashion and allowed to hang loosely.   Subsequently, a 7-French tear-away introducer sheath was placed, through  which was passed a Medtronic 6949 dual-coil active-fixation defibrillator  lead, serial #[id number].  Under fluoroscopic guidance, it was manipulated  to  the right ventricular apex where the bipolar R wave was 10.5 mV with a  pacing impedance of 718 ohms and a pacing threshold of 0.5 V at 0.5 msec, a  currented threshold of 0.8 mA and there was no diaphragmatic pacing at 10 V.   With these acceptable parameters recorded, the lead was secured to the  prepectoral fascia and attached to a Medtronic Maximo 7232CX ICD, serial  #[id number].  Through the device, the bipolar R wave was 8 mV with a pacing  impedance of 600 ohms and a pacing threshold of 0.5 V at 0.6 msec.  The high  voltage impedance was 61 ohms in the RV coil and the SCC coil was also 61  ohms.   With these acceptable parameters recorded, defibrillation threshold testing  was undertaken.  The pocket was copiously irrigated with antibiotic-  containing saline solution.  Hemostasis was assured.  The lead and the pulse  generator were then placed in the pocket.  Ventricular fibrillation was  induced via the T wave shock.  After a total duration of 5 seconds, a 10-  joule shock was delivered through a measured resistance of 45 ohms, failing  to terminate ventricular fibrillation.  After a total duration of 12  seconds, a 20-joule shock was delivered through a measured resistance of 45  ohms, terminating ventricular fibrillation and restoring sinus rhythm.   At this point, the patient became quite combative.  His blood pressure was  stable; he complained of chest pain.  Fluoroscopy of the cardiac silhouette  demonstrated normal contractile function.  It was elected at this point to  abort the DFT testing and Romazicon was given.  Upon awakening, the patient  claimed to feel fine without symptoms of chest pain or shortness of breath.  Vital signs were stable.  During this interlude, the pocket was closed in  three layers in normal fashion.  The wound was then washed and dressed.   The patient's device is on.  We will plan to recheck defibrillation  thresholds tomorrow prior to  discharge.   The patient tolerated the procedure otherwise without apparent complication.                                               Duke Salvia, M.D.    SCK/MEDQ  D:  01/17/2003  T:  01/17/2003  Job:  [id number]   cc:   [facility]   [facility]

## 2010-08-21 ENCOUNTER — Encounter: Payer: Medicare Other | Admitting: *Deleted

## 2010-08-22 ENCOUNTER — Ambulatory Visit (INDEPENDENT_AMBULATORY_CARE_PROVIDER_SITE_OTHER): Payer: Medicare Other | Admitting: *Deleted

## 2010-08-22 ENCOUNTER — Other Ambulatory Visit (INDEPENDENT_AMBULATORY_CARE_PROVIDER_SITE_OTHER): Payer: Medicare Other | Admitting: *Deleted

## 2010-08-22 DIAGNOSIS — I509 Heart failure, unspecified: Secondary | ICD-10-CM

## 2010-08-22 DIAGNOSIS — I428 Other cardiomyopathies: Secondary | ICD-10-CM

## 2010-08-22 DIAGNOSIS — E785 Hyperlipidemia, unspecified: Secondary | ICD-10-CM

## 2010-08-22 LAB — HEPATIC FUNCTION PANEL
ALT: 11 U/L (ref 0–53)
AST: 16 U/L (ref 0–37)
Albumin: 4 g/dL (ref 3.5–5.2)
Alkaline Phosphatase: 57 U/L (ref 39–117)
Bilirubin, Direct: 0.2 mg/dL (ref 0.0–0.3)
Indirect Bilirubin: 0.4 mg/dL (ref 0.0–0.9)
Total Bilirubin: 0.6 mg/dL (ref 0.3–1.2)
Total Protein: 6.5 g/dL (ref 6.0–8.3)

## 2010-08-22 LAB — LIPID PANEL
Cholesterol: 158 mg/dL (ref 0–200)
HDL: 59 mg/dL (ref >39)
LDL Cholesterol: 86 mg/dL (ref 0–99)
Total CHOL/HDL Ratio: 2.7 Ratio
Triglycerides: 66 mg/dL (ref <150)
VLDL: 13 mg/dL (ref 0–40)

## 2010-09-01 NOTE — Progress Notes (Signed)
Pt aware of results of lab and is very pleased with the results.  He reports he has been walking, taking his pills and trying to eat right.  I encouraged him to continue his current treatment.

## 2010-09-05 ENCOUNTER — Other Ambulatory Visit: Payer: Medicare Other | Admitting: *Deleted

## 2010-09-15 ENCOUNTER — Telehealth: Payer: Self-pay | Admitting: Emergency Medicine

## 2010-09-15 NOTE — Telephone Encounter (Signed)
Attempted to call patient and LM with male to have patient give me a call back

## 2010-09-15 NOTE — Telephone Encounter (Signed)
Pt is calling about BP problems. LMOM TCB.

## 2010-09-16 ENCOUNTER — Telehealth: Payer: Self-pay | Admitting: Internal Medicine

## 2010-09-16 NOTE — Telephone Encounter (Signed)
[place] Dec, please have him take his caridiolol 3.125 in am and decrease pm to 12.5 ( both doses are decreased )thanks stevee

## 2010-09-16 NOTE — Telephone Encounter (Signed)
Returning a call to [place].  This is his 3rd call.

## 2010-09-16 NOTE — Telephone Encounter (Signed)
Returning a call to Mark Clayton

## 2010-09-16 NOTE — Telephone Encounter (Signed)
Pt notified and will call back with any problem.

## 2010-09-16 NOTE — Telephone Encounter (Signed)
[email or web address]  

## 2010-09-16 NOTE — Telephone Encounter (Signed)
Refer to 09/15/10 note in chart

## 2010-09-16 NOTE — Telephone Encounter (Signed)
Pt called in today complaining of his BP being low when taking his Coreg 6.25. Sunday he states that after taking his Coreg his BP was 89/40. Monday his BP was 125/66, he then took his Coreg and he took his BP around 1pm and it was 86/51, 4pm was 125/59, 10pm 112/44, and this morning it was 127/63. Pt wants to know if he needs to do anything different. He state that when he does take his medication his BP drops too low and he feels dizzy and weak.

## 2010-09-19 ENCOUNTER — Encounter: Payer: Self-pay | Admitting: Cardiovascular Disease

## 2010-09-24 ENCOUNTER — Telehealth: Payer: Self-pay | Admitting: *Deleted

## 2010-09-24 NOTE — Telephone Encounter (Signed)
Pt called and stated since Coreg decr to 3.125mg  in AM and 12.5 in PM his BP has been running great. Just wanted to give feedback.

## 2010-11-20 ENCOUNTER — Telehealth: Payer: Self-pay

## 2010-11-20 MED ORDER — OMEPRAZOLE 20 MG PO CPDR: 20.0000 mg | DELAYED_RELEASE_CAPSULE | Freq: Every day | ORAL | Status: DC

## 2010-11-20 NOTE — Telephone Encounter (Signed)
Requested a refill for omeprazole.

## 2010-12-24 ENCOUNTER — Encounter: Payer: Self-pay | Admitting: Internal Medicine

## 2010-12-26 ENCOUNTER — Encounter: Payer: Medicare Other | Admitting: *Deleted

## 2010-12-26 ENCOUNTER — Encounter: Payer: Self-pay | Admitting: *Deleted

## 2010-12-26 ENCOUNTER — Encounter: Payer: Self-pay | Admitting: Internal Medicine

## 2010-12-26 ENCOUNTER — Telehealth: Payer: Self-pay

## 2010-12-26 ENCOUNTER — Ambulatory Visit (INDEPENDENT_AMBULATORY_CARE_PROVIDER_SITE_OTHER): Payer: Medicare Other | Admitting: Internal Medicine

## 2010-12-26 DIAGNOSIS — Z95810 Presence of automatic (implantable) cardiac defibrillator: Secondary | ICD-10-CM | POA: Insufficient documentation

## 2010-12-26 DIAGNOSIS — I509 Heart failure, unspecified: Secondary | ICD-10-CM

## 2010-12-26 DIAGNOSIS — R079 Chest pain, unspecified: Secondary | ICD-10-CM | POA: Insufficient documentation

## 2010-12-26 DIAGNOSIS — R0602 Shortness of breath: Secondary | ICD-10-CM

## 2010-12-26 DIAGNOSIS — I2589 Other forms of chronic ischemic heart disease: Secondary | ICD-10-CM | POA: Insufficient documentation

## 2010-12-26 DIAGNOSIS — I428 Other cardiomyopathies: Secondary | ICD-10-CM

## 2010-12-26 LAB — ICD DEVICE OBSERVATION
BATTERY VOLTAGE: 3.1457 V
BRDY-0002RV: 40 {beats}/min
CHARGE TIME: 9.359 s
DEV-0020ICD: NEGATIVE
FVT: 0
PACEART VT: 0
RV LEAD AMPLITUDE: 31.625 mv
RV LEAD IMPEDENCE ICD: 494 Ohm
RV LEAD THRESHOLD: 1 V
TOT-0001: 1
TOT-0002: 0
TOT-0006: 20100913000000
TZAT-0001FASTVT: 1
TZAT-0001SLOWVT: 1
TZAT-0002FASTVT: NEGATIVE
TZAT-0002SLOWVT: NEGATIVE
TZAT-0012FASTVT: 200 ms
TZAT-0012SLOWVT: 200 ms
TZAT-0018FASTVT: NEGATIVE
TZAT-0018SLOWVT: NEGATIVE
TZAT-0019FASTVT: 8 V
TZAT-0019SLOWVT: 8 V
TZAT-0020FASTVT: 1.5 ms
TZAT-0020SLOWVT: 1.5 ms
TZON-0003SLOWVT: 370 ms
TZON-0003VSLOWVT: 370 ms
TZON-0004SLOWVT: 16
TZON-0004VSLOWVT: 32
TZON-0005SLOWVT: 12
TZST-0001FASTVT: 2
TZST-0001FASTVT: 3
TZST-0001FASTVT: 4
TZST-0001FASTVT: 5
TZST-0001FASTVT: 6
TZST-0001SLOWVT: 2
TZST-0001SLOWVT: 3
TZST-0001SLOWVT: 4
TZST-0001SLOWVT: 5
TZST-0001SLOWVT: 6
TZST-0002FASTVT: NEGATIVE
TZST-0002FASTVT: NEGATIVE
TZST-0002FASTVT: NEGATIVE
TZST-0002FASTVT: NEGATIVE
TZST-0002FASTVT: NEGATIVE
TZST-0002SLOWVT: NEGATIVE
TZST-0002SLOWVT: NEGATIVE
TZST-0002SLOWVT: NEGATIVE
TZST-0002SLOWVT: NEGATIVE
TZST-0002SLOWVT: NEGATIVE
VENTRICULAR PACING ICD: 0.02 pct
VF: 0

## 2010-12-26 MED ORDER — FUROSEMIDE 20 MG PO TABS: ORAL_TABLET | ORAL | Status: DC

## 2010-12-26 NOTE — Assessment & Plan Note (Signed)
With recurrent symptoms of SOB and chest pain.  Concerning for progression of cad wuill do myoview with low thresholf for cath

## 2010-12-26 NOTE — Patient Instructions (Addendum)
Take Furosemide 40 mg alternate with 20 mg every other day for seven days.  Then resume 20 mg daily. We will call you with a date for the [facility].

## 2010-12-26 NOTE — Assessment & Plan Note (Signed)
Stable on current meds 

## 2010-12-26 NOTE — Assessment & Plan Note (Signed)
As above.

## 2010-12-26 NOTE — Telephone Encounter (Signed)
Rx sent for 90 day supply.

## 2010-12-26 NOTE — Assessment & Plan Note (Signed)
The patient's device was interrogated.  The information was reviewed. No changes were made in the programming.    

## 2010-12-26 NOTE — Progress Notes (Signed)
  HPI  Mark Clayton is a 70 y.o. male  seen in followup for ischemic heart disease. He is status post ICD implantation for primary prevention with 6949-lead. He recently underwent device generator replacement with insertion of a new right ventricular pace sense lead.  He underwent stress testing in Jan for preopclearance which showed no ischemia, he was not having chest pain at the time,  He now has problems with modest exertion over the last two weeks assoc withshortness of breath and chest tightness . He does have chronic mild shortness of breath. and notes that there is shortness of breath particularly when bending for a long period of time. There has been no peripheral edema.  his last echo from fall 2009 and was treated mild to moderate aortic stenosis  Past Medical History  Diagnosis Date  . Ischemic cardiomyopathy     proir bypass. EF 25%. (Master study EF >20%)  . Orthostatic headache     intolerance  . Cough     question related to lisinopril v infection  . HTN (hypertension)     systolic  . Dyslipidemia   . CAD (coronary artery disease)     severe 3 vessel CAD w/unstable angina and acute MI    Past Surgical History  Procedure Date  . Icd impalntation 01/17/03    6949 lead. medtronic. remote-no    Current Outpatient Prescriptions  Medication Sig Dispense Refill  . aspirin 81 MG tablet Take 81 mg by mouth daily.        . carvedilol (COREG) 25 MG tablet Take 12.5 mg by mouth 1 day or 1 dose. Take in the pm       . carvedilol (COREG) 6.25 MG tablet Take 6.25 mg by mouth every morning.        . furosemide (LASIX) 20 MG tablet Take 40 mg alternate with 20 mg every other day for seven days.  Then resume 20 mg daily.  60 tablet  6  . nitroGLYCERIN (NITROSTAT) 0.4 MG SL tablet Place 0.4 mg under the tongue every 5 (five) minutes as needed.        Marland Kitchen omeprazole (PRILOSEC) 20 MG capsule Take 1 capsule (20 mg total) by mouth daily.  90 capsule  3  . simvastatin (ZOCOR) 80 MG  tablet Take 0.5 tablets (40 mg total) by mouth at bedtime.  90 tablet  3    Allergies  Allergen Reactions  . Rosuvastatin     REACTION: leg pain    Review of Systems negative except from HPI and PMH  Physical Exam Well developed and well nourished in no acute distress HENT normal E scleral and icterus clear Neck Supple JVP flat; carotids brisk and full Clear to ausculation Regular rate and rhythm, no murmurs gallops or rub Soft with active bowel sounds No clubbing cyanosis and edema Alert and oriented, grossly normal motor and sensory function Skin Warm and Dry  ECG SR 67  .17/.11/.42 LVH w repol   Assessment and  Plan

## 2010-12-29 ENCOUNTER — Ambulatory Visit (HOSPITAL_COMMUNITY): Payer: Medicare Other | Attending: Internal Medicine | Admitting: Radiology

## 2010-12-29 DIAGNOSIS — I2510 Atherosclerotic heart disease of native coronary artery without angina pectoris: Secondary | ICD-10-CM

## 2010-12-29 DIAGNOSIS — I428 Other cardiomyopathies: Secondary | ICD-10-CM | POA: Insufficient documentation

## 2010-12-29 DIAGNOSIS — I509 Heart failure, unspecified: Secondary | ICD-10-CM | POA: Insufficient documentation

## 2010-12-29 DIAGNOSIS — R0602 Shortness of breath: Secondary | ICD-10-CM | POA: Insufficient documentation

## 2010-12-29 DIAGNOSIS — R0789 Other chest pain: Secondary | ICD-10-CM

## 2010-12-29 DIAGNOSIS — R079 Chest pain, unspecified: Secondary | ICD-10-CM | POA: Insufficient documentation

## 2010-12-29 MED ORDER — TECHNETIUM TC 99M TETROFOSMIN IV KIT
33.0000 | PACK | Freq: Once | INTRAVENOUS | Status: AC | PRN
  Administered 2010-12-29: 33 via INTRAVENOUS

## 2010-12-29 MED ORDER — TECHNETIUM TC 99M TETROFOSMIN IV KIT
11.0000 | PACK | Freq: Once | INTRAVENOUS | Status: AC | PRN
  Administered 2010-12-29: 11 via INTRAVENOUS

## 2010-12-29 MED ORDER — REGADENOSON 0.4 MG/5ML IV SOLN
0.4000 mg | Freq: Once | INTRAVENOUS | Status: AC
  Administered 2010-12-29: 0.4 mg via INTRAVENOUS

## 2010-12-29 NOTE — Progress Notes (Signed)
[facility] SITE 3 NUCLEAR MED [address] [place] [address] [phone]  Cardiology Nuclear Med Study  Mark Clayton is a 70 y.o. male [id number] October 03, 1940   Nuclear Med Background Indication for Stress Test:  Evaluation for Ischemia and Graft Patency History: 03 CABG,2/10 Defibrillator,11 Echo:EF=35%,03 Myocardial Infarction of Anterior Wall,10 Myocardial Perfusion Study;EF=33%,Fixed defect of anterior wall and septum and 2/10Pacemaker Cardiac Risk Factors: Family History - CAD, Hypertension and Lipids  Symptoms:  Chest Tightness with Exertion (last date of chest discomfort several days), Diaphoresis, Dizziness, DOE, Fatigue, Light-Headedness, Rapid HR and SOB   Nuclear Pre-Procedure Caffeine/Decaff Intake:  None NPO After: 8:30pm   Lungs:  clear IV 0.9% NS with Angio Cath:  22g  IV Site: R Hand  IV Started by:  Doyne Keel, CNMT  Chest Size (in):  42 Cup Size: n/a  Height: 6' (1.829 m)  Weight:  173 lb (78.472 kg)  BMI:  Body mass index is 23.46 kg/(m^2). Tech Comments:  Coreg held 24 hrs    Nuclear Med Study 1 or 2 day study: 1 day  Stress Test Type:  Treadmill/Lexiscan  Reading MD: Cassell Clement, MD  Order Authorizing Provider:  Ferman Hamming  Resting Radionuclide: Technetium 68m Tetrofosmin  Resting Radionuclide Dose: 11.0 mCi   Stress Radionuclide:  Technetium 7m Tetrofosmin  Stress Radionuclide Dose: 33.0 mCi           Stress Protocol Rest HR: 65 Stress HR: 111  Rest BP: 154/75 Stress BP: 172/79  Exercise Time (min): 2:00 METS: 1.6   Predicted Max HR: 150 bpm % Max HR: 74 bpm Rate Pressure Product: 46962   Dose of Adenosine (mg):  n/a Dose of Lexiscan: 0.4 mg  Dose of Atropine (mg): n/a Dose of Dobutamine: n/a mcg/kg/min (at max HR)  Stress Test Technologist: Cathlyn Parsons, RN  Nuclear Technologist:  Doyne Keel, CNMT     Rest Procedure:  Myocardial perfusion imaging was performed at rest 45 minutes following the  intravenous administration of Technetium 86m Tetrofosmin. Rest ECG: NSR with LVH and frequent PVC's and occasional PAC's  Stress Procedure:  The patient received IV Lexiscan 0.4 mg over 15-seconds with concurrent low level exercise and then Technetium 22m Tetrofosmin was injected at 30-seconds while the patient continued walking one more minute.  There were  significant changes with Lexiscan.  Patient had chest and neck tightness with infusion and relieved after infusion.Patient had frequent PVC's,couplet and occasional PAC's.Quantitative spect images were obtained after a 45-minute delay.  Dr.Ross(DOD) consulted with images, chest/neck tightness and  EKG changes. Patient discharged to home per Dr. Tenny Craw.   Stress ECG: Baseline inferolateral ST depression worsens with stress.  QPS Raw Data Images:  Normal; no motion artifact; normal heart/lung ratio. Stress Images:  Decreased uptake in anteriorapical and inferoseptal areas. Rest Images:  Decrease uptake in anteroapical and inferoseptal areas Subtraction (SDS):  There is partial reversibility of mid-inferoseptal and mid- anterior areas Transient Ischemic Dilatation (Normal <1.22):  1.04 Lung/Heart Ratio (Normal <0.45):  0.36  Quantitative Gated Spect Images QGS EDV:  275 ml QGS ESV:  199 ml QGS cine images:  Global hypokinesis QGS EF: 28%  Impression Exercise Capacity:  Lexiscan with low level exercise. BP Response:  Normal blood pressure response. Clinical Symptoms:  Significant chest pain. ECG Impression:  Significant ST abnormalities consistent with ischemia. Comparison with Prior Nuclear Study: Since previous study of 11/28/08  EF has fallen from 33% to 28%.  Overall Impression:  Abnormal stress nuclear study.  Large areas of anterioapical and inferoseptal scar with partial reversibility.  Severe LV systolic dysfunction.     Cassell Clement

## 2010-12-30 ENCOUNTER — Encounter: Payer: Self-pay | Admitting: *Deleted

## 2010-12-30 ENCOUNTER — Other Ambulatory Visit: Payer: Self-pay | Admitting: *Deleted

## 2010-12-30 DIAGNOSIS — R9430 Abnormal result of cardiovascular function study, unspecified: Secondary | ICD-10-CM

## 2010-12-30 NOTE — Progress Notes (Signed)
The patient is scheduled for a cath with Dr. Swaziland on 01/02/11 in the main lab. I have given him verbal instructions for his procedure. He will come tomorrow for pre-procedure labs and will pick up a letter of his instructions then.

## 2010-12-30 NOTE — Progress Notes (Signed)
Reviewed the scan from September 2010 which described no ischemia. Given his symptoms and a new ischemia, catheterization is indicated. I have discussed this with the patient.  This hsould be done in the inpatient lab given high pretest probability.  Renal function was normal May 2011. We'll anticipate that being the case and will bring them in the morning of the procedure

## 2010-12-31 ENCOUNTER — Telehealth: Payer: Self-pay | Admitting: *Deleted

## 2010-12-31 ENCOUNTER — Other Ambulatory Visit: Payer: Medicare Other | Admitting: *Deleted

## 2010-12-31 ENCOUNTER — Other Ambulatory Visit (INDEPENDENT_AMBULATORY_CARE_PROVIDER_SITE_OTHER): Payer: Medicare Other | Admitting: *Deleted

## 2010-12-31 DIAGNOSIS — R9430 Abnormal result of cardiovascular function study, unspecified: Secondary | ICD-10-CM

## 2010-12-31 LAB — CBC WITH DIFFERENTIAL/PLATELET
Basophils Absolute: 0.1 10*3/uL (ref 0.0–0.1)
Basophils Relative: 0.8 % (ref 0.0–3.0)
Eosinophils Absolute: 0.3 10*3/uL (ref 0.0–0.7)
Eosinophils Relative: 4 % (ref 0.0–5.0)
HCT: 26.2 % — ABNORMAL LOW (ref 39.0–52.0)
Hemoglobin: 7.7 g/dL — CL (ref 13.0–17.0)
Lymphocytes Relative: 18.9 % (ref 12.0–46.0)
Lymphs Abs: 1.4 10*3/uL (ref 0.7–4.0)
MCHC: 29.5 g/dL — ABNORMAL LOW (ref 30.0–36.0)
MCV: 66 fl — ABNORMAL LOW (ref 78.0–100.0)
Monocytes Absolute: 0.6 10*3/uL (ref 0.1–1.0)
Monocytes Relative: 8.8 % (ref 3.0–12.0)
Neutro Abs: 4.9 10*3/uL (ref 1.4–7.7)
Neutrophils Relative %: 67.5 % (ref 43.0–77.0)
Platelets: 259 10*3/uL (ref 150.0–400.0)
RBC: 3.98 Mil/uL — ABNORMAL LOW (ref 4.22–5.81)
RDW: 18.1 % — ABNORMAL HIGH (ref 11.5–14.6)
WBC: 7.3 10*3/uL (ref 4.5–10.5)

## 2010-12-31 LAB — BASIC METABOLIC PANEL
BUN: 12 mg/dL (ref 6–23)
CO2: 27 mEq/L (ref 19–32)
Calcium: 8.8 mg/dL (ref 8.4–10.5)
Chloride: 106 mEq/L (ref 96–112)
Creatinine, Ser: 1.1 mg/dL (ref 0.4–1.5)
GFR: 69.6 mL/min (ref >60.00)
Glucose, Bld: 143 mg/dL — ABNORMAL HIGH (ref 70–99)
Potassium: 4.4 mEq/L (ref 3.5–5.1)
Sodium: 139 mEq/L (ref 135–145)

## 2010-12-31 LAB — PROTIME-INR
INR: 1 (ref <1.50)
Prothrombin Time: 13.6 seconds (ref 11.6–15.2)

## 2010-12-31 NOTE — Telephone Encounter (Signed)
Cath cancelled per Dr. Graciela Husbands and Dr. Swaziland.  I have sent a message as per Dr. Graciela Husbands to Dr. Excell Seltzer re: low hemoglobin.  I will call pt and update him of these events. Pt will give Dr. Raphael Gibney office a call tomorrow if he has not heard from their office per Dr. Graciela Husbands.  Mylo Red RN

## 2010-12-31 NOTE — Telephone Encounter (Signed)
In talking with pt he states he has never seen Dr. Jonny Ruiz.  He was listed as pcp. Pt is aware he may receive a call from Dr. Raphael Gibney office. Pt also says that he may try to see an urgent care md in his area about his hemoglobin. I will forward this to Dr. Jonny Ruiz and Dr. Graciela Husbands. Mylo Red RN

## 2010-12-31 NOTE — Telephone Encounter (Signed)
Pre cath hemoglobin called to our office it is 7.7  .  DOD Dr. Johney Frame aware pt was called and Dr. Graciela Husbands notified as requested by Dr. Johney Frame. Pt states he is feeling fine. " I cut 8-10 heads of hair before I came in this morning". He says he is no more tired than usual. He had been aware his hemoglobin was low in the past. Mylo Red RN

## 2011-01-01 ENCOUNTER — Telehealth: Payer: Self-pay | Admitting: Internal Medicine

## 2011-01-01 NOTE — Telephone Encounter (Signed)
I spoke with Mrs. Mark Clayton. The fax # below is for Dr. [name]. The patient is scheduled to see him on 01/05/11. I have faxed a copy of the patient's labs to Dr. Clovis Riley at [phone].

## 2011-01-01 NOTE — Telephone Encounter (Signed)
Patient wife calling back with information . Dr. Clovis Riley  fax # [phone].

## 2011-01-02 ENCOUNTER — Ambulatory Visit (HOSPITAL_COMMUNITY): Admission: RE | Admit: 2011-01-02 | Payer: Medicare Other | Source: Ambulatory Visit | Admitting: Cardiology

## 2011-01-05 ENCOUNTER — Telehealth: Payer: Self-pay | Admitting: Internal Medicine

## 2011-01-05 ENCOUNTER — Encounter: Payer: Self-pay | Admitting: Internal Medicine

## 2011-01-05 NOTE — Telephone Encounter (Signed)
All Cardiac faxed to [facility] @ [phone]   01/05/11/km

## 2011-01-07 ENCOUNTER — Telehealth: Payer: Self-pay | Admitting: Internal Medicine

## 2011-01-07 NOTE — Telephone Encounter (Signed)
Pt called to let you know that he is  Going to a GI doctor Dr. Rhea Belton on Monday.  This is the information you needed.  Pt aware that Herbert Seta is off today.

## 2011-01-08 NOTE — Telephone Encounter (Signed)
Noted. Will forward to Dr. Graciela Husbands as an Lorain Childes.

## 2011-01-12 ENCOUNTER — Encounter: Payer: Self-pay | Admitting: Internal Medicine

## 2011-01-12 ENCOUNTER — Ambulatory Visit (INDEPENDENT_AMBULATORY_CARE_PROVIDER_SITE_OTHER): Payer: Medicare Other | Admitting: Internal Medicine

## 2011-01-12 ENCOUNTER — Other Ambulatory Visit (INDEPENDENT_AMBULATORY_CARE_PROVIDER_SITE_OTHER): Payer: Medicare Other

## 2011-01-12 DIAGNOSIS — R195 Other fecal abnormalities: Secondary | ICD-10-CM

## 2011-01-12 DIAGNOSIS — D649 Anemia, unspecified: Secondary | ICD-10-CM

## 2011-01-12 DIAGNOSIS — D509 Iron deficiency anemia, unspecified: Secondary | ICD-10-CM | POA: Insufficient documentation

## 2011-01-12 LAB — CBC WITH DIFFERENTIAL/PLATELET
Basophils Absolute: 0.1 K/uL (ref 0.0–0.1)
Basophils Relative: 0.9 % (ref 0.0–3.0)
Eosinophils Absolute: 0.4 K/uL (ref 0.0–0.7)
Eosinophils Relative: 5.3 % — ABNORMAL HIGH (ref 0.0–5.0)
HCT: 29.5 % — ABNORMAL LOW (ref 39.0–52.0)
Hemoglobin: 8.9 g/dL — ABNORMAL LOW (ref 13.0–17.0)
Lymphocytes Relative: 18.8 % (ref 12.0–46.0)
Lymphs Abs: 1.3 K/uL (ref 0.7–4.0)
MCHC: 30.3 g/dL (ref 30.0–36.0)
MCV: 68.6 fl — ABNORMAL LOW (ref 78.0–100.0)
Monocytes Absolute: 0.6 K/uL (ref 0.1–1.0)
Monocytes Relative: 8.5 % (ref 3.0–12.0)
Neutro Abs: 4.5 K/uL (ref 1.4–7.7)
Neutrophils Relative %: 66.5 % (ref 43.0–77.0)
Platelets: 287 K/uL (ref 150.0–400.0)
RBC: 4.3 Mil/uL (ref 4.22–5.81)
RDW: 20.8 % — ABNORMAL HIGH (ref 11.5–14.6)
WBC: 6.8 K/uL (ref 4.5–10.5)

## 2011-01-12 LAB — IBC PANEL
Iron: 26 ug/dL — ABNORMAL LOW (ref 42–165)
Saturation Ratios: 5.9 % — ABNORMAL LOW (ref 20.0–50.0)
Transferrin: 313.6 mg/dL (ref 212.0–360.0)

## 2011-01-12 LAB — IGA: IgA: 160 mg/dL (ref 68–378)

## 2011-01-12 MED ORDER — INTEGRA PLUS PO CAPS: ORAL_CAPSULE | ORAL | Status: DC

## 2011-01-12 NOTE — Patient Instructions (Addendum)
You have been scheduled for a colonoscopy. Please follow written instructions given to you at your visit today.  Please pick up your Moviprep kit at the pharmacy within the next 2-3 days. Your physician has requested that you go to the basement for the following lab work before leaving today: CBC, tTG, IgA, IBC Panel We have sent the following medications to your pharmacy for you to pick up at your convenience: Integra Plus. Take 1 capsule by mouth once daily. We have also given you samples of this. CC: Dr [name]

## 2011-01-12 NOTE — Progress Notes (Signed)
Subjective:    Patient ID: Mark Clayton, male    DOB: 1940-12-03, 70 y.o.   MRN: [id number]  HPI [name] is a 70 year old man with a past medical history of ischemic cardiomyopathy status post CABG and ICD placement, hypertension, hyperlipidemia who is seen in consultation at the request of Dr. Clovis Riley for evaluation of microcytic anemia and heme + stools.  The patient reports no overt blood loss, but that he was told recently his stool was heme positive. He has never had a colonoscopy. He reports some lower abdominal pain, inferior to the umbilicus which he describes as a cramping pain. This pain tends to get better with bowel movement. It is not a daily pain and seems to be intermittent and unpredictable. He reports one to 2 months of change in his stool. He notes his stools are more loose and occasionally diarrhea. He is using Imodium on occasion for the diarrhea. He denies melena or hematochezia. He's had no rectal pain. He denies weight loss or change in appetite. He's had no nausea or vomiting. No trouble with heartburn. No early satiety.  He does note some increased overall fatigue and mild increase in dyspnea on exertion. 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  He notes no family history of colon cancer or polyps to his knowledge.   Review of Systems Constitutional: Negative for fever, chills, night sweats, activity change, appetite change and unexpected weight change HEENT: Negative for sore throat, mouth sores and trouble swallowing. Eyes: Negative for visual disturbance Respiratory: Negative for cough, occasional chest tightness and dyspnea with exertion Cardiovascular: Negative for chest pain, palpitations and lower extremity swelling Gastrointestinal: See history of present illness Genitourinary: Negative for dysuria and hematuria. Musculoskeletal: Negative for back pain, arthralgias and myalgias, positive intermittent muscle cramping Skin: Negative for rash or  color change Neurological: Negative for headaches, weakness, numbness Hematological: Negative for adenopathy, negative for easy bruising/bleeding Psychiatric/behavioral: Negative for depressed mood, negative for anxiety   Past Medical History  Diagnosis Date  . Ischemic cardiomyopathy     proir bypass. EF 25%. (Master study EF >20%)  . HTN (hypertension)     systolic  . CAD (coronary artery disease)     severe 3 vessel CAD w/unstable angina and acute MI  . Anemia   . Myocardial infarct   . Carotid bruit   . Hyperlipidemia    Social History  . Marital Status: Married    Number of Children: 3   Social History Main Topics  . Smoking status: Never Smoker   . Smokeless tobacco: Never Used   Comment: tobacco use - no  . Alcohol Use: 0.6 oz/week    1 Cans of beer per week  . Drug Use: No   Family History  Problem Relation Age of Onset  . Hypertension Father   . Hypertension Sister   . Hyperlipidemia Father   . Hyperlipidemia Sister   . Heart disease Father   . Prostate cancer Father   . Alzheimer's disease Mother       Objective:   Physical Exam BP 130/60  Pulse 80  Ht 6' (1.829 m)  Wt 176 lb 9.6 oz (80.105 kg)  BMI 23.95 kg/m2 Constitutional: Well-developed and well-nourished. No distress. HEENT: Normocephalic and atraumatic. Oropharynx is clear and moist. No oropharyngeal exudate. Conjunctivae are pale. Pupils are equal round and reactive to light. No scleral icterus. Neck: Neck supple. Trachea midline. Cardiovascular: Normal rate, regular rhythm and intact distal pulses.  No M/R/G, pacemaker left upper chest Pulmonary/chest: Effort normal and breath sounds normal. No wheezing, rales or rhonchi. Abdominal: Soft, nontender, nondistended. Bowel sounds active throughout. There are no masses palpable. No hepatosplenomegaly. Lymphadenopathy: No cervical adenopathy noted. Neurological: Alert and oriented to person place and time. Skin: Skin is warm and dry. No rashes  noted. Psychiatric: Normal mood and affect. Behavior is normal.  CBC    Component Value Date/Time   WBC 7.3 12/31/2010 1448   RBC 3.98* 12/31/2010 1448   HGB 7.7 Repeated and verified X2.* 12/31/2010 1448   HCT 26.2* 12/31/2010 1448   PLT 259.0 12/31/2010 1448   MCV 66.0* 12/31/2010 1448   MCHC 29.5* 12/31/2010 1448   RDW 18.1* 12/31/2010 1448   LYMPHSABS 1.4 12/31/2010 1448   MONOABS 0.6 12/31/2010 1448   EOSABS 0.3 12/31/2010 1448   BASOSABS 0.1 12/31/2010 1448   CMP     Component Value Date/Time   NA 139 12/31/2010 1448   K 4.4 12/31/2010 1448   CL 106 12/31/2010 1448   CO2 27 12/31/2010 1448   GLUCOSE 143* 12/31/2010 1448   BUN 12 12/31/2010 1448   CREATININE 1.1 12/31/2010 1448   CALCIUM 8.8 12/31/2010 1448   PROT 6.5 08/22/2010 1136   ALBUMIN 4.0 08/22/2010 1136   AST 16 08/22/2010 1136   ALT 11 08/22/2010 1136   ALKPHOS 57 08/22/2010 1136   BILITOT 0.6 08/22/2010 1136   GFRNONAA >60 08/27/2009 1301      Assessment & Plan:  70 year old man with a past medical history of ischemic cardiomyopathy status post CABG and ICD placement, hypertension, hyperlipidemia who is seen in consultation at the request of Dr. Clovis Riley for evaluation of microcytic anemia and heme + stools.  1. Microcytic anemia/heme+ stools -- the combination of microcytic anemia and heme-positive stools is concerning in a 70 year old male who has never had colorectal cancer screening. His hemoglobin is down to 7.7 and trending back this has been a slow decline since around 2010. At present he is not having overwhelming symptoms of anemia, but I am concerned that his blood counts drop much more he will develop such symptoms. I will recheck CBC with iron studies today. I also will check a celiac panel given iron deficiency. He needs a colonoscopy and we discussed this test today. He is agreeable and we will schedule this for him soon. We briefly discussed the possible need for blood transfusion, and if this is necessary he is okay  proceeding. I would like him to take a prescription iron on a daily basis, and therefore I have given him a prescription for Integra Plus to be taken once daily. He was also given samples of this medication. He has been advised about possible constipation associated with this medication and he will call our office if this occurs. Finally we briefly discussed if colonoscopy is negative then further workup will be necessary to explain his iron deficiency, including EGD +/- VCE.

## 2011-01-13 ENCOUNTER — Telehealth: Payer: Self-pay | Admitting: Internal Medicine

## 2011-01-13 ENCOUNTER — Telehealth: Payer: Self-pay | Admitting: *Deleted

## 2011-01-13 LAB — TISSUE TRANSGLUTAMINASE, IGA: Tissue Transglutaminase Ab, IgA: 1.1 U/mL (ref <20)

## 2011-01-13 NOTE — Telephone Encounter (Signed)
Message copied by Florene Glen on Tue Jan 13, 2011 11:30 AM ------      Message from: Beverley Fiedler      Created: Mon Jan 12, 2011  2:46 PM       Please let patient know that his CBC showed an increase in his hemoglobin to 8.9 from 7.7      This is good news, however still low and his labs are consistent with iron deficiency      I do not think he needs a blood transfusion at this point, but feel we should continue with colonoscopy as discussed today

## 2011-01-13 NOTE — Telephone Encounter (Signed)
See previous phone note. Pt stated understanding with Dr Lauro Franklin findings.

## 2011-01-13 NOTE — Telephone Encounter (Signed)
Informed pt of Dr Lauro Franklin findings and recommendations. Pt stat Dr Rhea Belton increased his OTC iron. He will have COLON on 01/19/11 at 1330.

## 2011-01-14 ENCOUNTER — Telehealth: Payer: Self-pay | Admitting: Internal Medicine

## 2011-01-14 MED ORDER — PEG-KCL-NACL-NASULF-NA ASC-C 100 G PO SOLR: 1.0000 | ORAL | Status: DC

## 2011-01-14 NOTE — Telephone Encounter (Signed)
Pt aware med sent to pharmacy

## 2011-01-15 ENCOUNTER — Telehealth: Payer: Self-pay

## 2011-01-15 NOTE — Telephone Encounter (Signed)
The patient is to take Furosemide 20 mg take one tablet daily. Faxed form to [facility] mail service.

## 2011-01-16 ENCOUNTER — Telehealth: Payer: Self-pay | Admitting: Internal Medicine

## 2011-01-16 MED ORDER — PEG-KCL-NACL-NASULF-NA ASC-C 100 G PO SOLR: 1.0000 | ORAL | Status: DC

## 2011-01-16 NOTE — Telephone Encounter (Signed)
Pharmacy ([facility]) Spoke with Mark Clayton she was  notified that Movi was sent to them in error and was sent to the correct pharmacy

## 2011-01-19 ENCOUNTER — Encounter: Payer: Self-pay | Admitting: Internal Medicine

## 2011-01-19 ENCOUNTER — Telehealth: Payer: Self-pay | Admitting: *Deleted

## 2011-01-19 ENCOUNTER — Ambulatory Visit (AMBULATORY_SURGERY_CENTER): Payer: Medicare Other | Admitting: Internal Medicine

## 2011-01-19 ENCOUNTER — Other Ambulatory Visit: Payer: Self-pay | Admitting: Internal Medicine

## 2011-01-19 DIAGNOSIS — D649 Anemia, unspecified: Secondary | ICD-10-CM

## 2011-01-19 DIAGNOSIS — C189 Malignant neoplasm of colon, unspecified: Secondary | ICD-10-CM

## 2011-01-19 DIAGNOSIS — R195 Other fecal abnormalities: Secondary | ICD-10-CM

## 2011-01-19 DIAGNOSIS — IMO0002 Reserved for concepts with insufficient information to code with codable children: Secondary | ICD-10-CM

## 2011-01-19 DIAGNOSIS — D126 Benign neoplasm of colon, unspecified: Secondary | ICD-10-CM

## 2011-01-19 DIAGNOSIS — D509 Iron deficiency anemia, unspecified: Secondary | ICD-10-CM

## 2011-01-19 DIAGNOSIS — C20 Malignant neoplasm of rectum: Secondary | ICD-10-CM

## 2011-01-19 MED ORDER — SODIUM CHLORIDE 0.9 % IV SOLN: 500.0000 mL | INTRAVENOUS | Status: DC

## 2011-01-19 NOTE — Patient Instructions (Addendum)
MASS IN SIGMOID COLON, POLYP IN RECTUM.  AWAIT PATHOLOGY RESULTS.  GENERAL SURGERY REFERRAL ONCE PATHOLOGY RESULTS AVAILABLE.  CT SCAN AT [facility] ON [address] :  Tuesday  01/20/2011  AT  930  SEE INSTRUCTIONS FOR CT SCAN ALSO BLUE AND GREEN SHEETS FOR ADDITIONAL D/C INSTRUCTIONS.

## 2011-01-19 NOTE — Telephone Encounter (Signed)
Informed pt and wife of CT scan tomorrow at 0930am. Pt given written instructions for npo status and times to drink contrast; pt and wife stated understanding.

## 2011-01-20 ENCOUNTER — Telehealth: Payer: Self-pay

## 2011-01-20 ENCOUNTER — Telehealth: Payer: Self-pay | Admitting: *Deleted

## 2011-01-20 ENCOUNTER — Ambulatory Visit (INDEPENDENT_AMBULATORY_CARE_PROVIDER_SITE_OTHER)
Admission: RE | Admit: 2011-01-20 | Discharge: 2011-01-20 | Disposition: A | Discharge status: Home / Self Care | Payer: Medicare Other | Source: Ambulatory Visit | Attending: Internal Medicine | Admitting: Internal Medicine

## 2011-01-20 DIAGNOSIS — C189 Malignant neoplasm of colon, unspecified: Secondary | ICD-10-CM

## 2011-01-20 MED ORDER — INTEGRA PLUS PO CAPS: ORAL_CAPSULE | ORAL | Status: DC

## 2011-01-20 MED ORDER — IOHEXOL 300 MG/ML  SOLN
100.0000 mL | Freq: Once | INTRAMUSCULAR | Status: AC | PRN
  Administered 2011-01-20: 100 mL via INTRAVENOUS

## 2011-01-20 NOTE — Telephone Encounter (Signed)
Pt's wife requested we order iron pills to [facility]- done. She also requested either Dr Daphine Deutscher, Ezzard Standing, or [facility]- informed Dr Rhea Belton. Wife also reports pt has a lot of gas today and his stool is finally clear. Wife states pt says the gas pains " come and go real quick". Spoke with Dr Rhea Belton who stated this is to expected, he'll just have to get all of the gas out; informed wife who stated understanding.

## 2011-01-20 NOTE — Telephone Encounter (Signed)
Left message on answering machine. 

## 2011-01-20 NOTE — Telephone Encounter (Signed)
Notified wife that a surgical referral was made to [facility], Dr Wenda Low for 02/05/11 at 0900. Wife stated understanding; we will call back with an oncology referral.

## 2011-01-21 ENCOUNTER — Encounter: Payer: Self-pay | Admitting: Internal Medicine

## 2011-01-21 ENCOUNTER — Telehealth: Payer: Self-pay | Admitting: *Deleted

## 2011-01-21 NOTE — Telephone Encounter (Signed)
Notified pt we made a [facility] to the [facility] and Dr Truett Perna' s  ofc will contact him and it might be after surgery. Also, Dr Rhea Belton has asked Dr Daphine Deutscher for an earlier appt; pt stated understanding.

## 2011-01-21 NOTE — Telephone Encounter (Signed)
Message copied by Florene Glen on Wed Jan 21, 2011  1:23 PM ------      Message from: Beverley Fiedler      Created: Wed Jan 21, 2011 12:59 PM       Spoke with pt by phone to inform of tissue dx of adenoca.      [facility] referral pending, I messaged Dr. Daphine Deutscher to try and get him seen sooner.      Needs med onc referral, Dr. Truett Perna.      Thanks.

## 2011-01-21 NOTE — Progress Notes (Unsigned)
  Subjective:    Patient ID: Mark Clayton, male    DOB: 1940/12/22, 70 y.o.   MRN: [id number]  HPI    Review of Systems     Objective:   Physical Exam        Assessment & Plan:

## 2011-01-21 NOTE — Telephone Encounter (Signed)
Faxed for referral to Dr Truett Perna at the [facility].

## 2011-01-23 ENCOUNTER — Telehealth: Payer: Self-pay | Admitting: Internal Medicine

## 2011-01-23 NOTE — Telephone Encounter (Signed)
Spoke with pt's wife to inform her Dr Rhea Belton has sent 2 messages to Dr Daphine Deutscher and I called his nurse today only to find out he is on vacation. I left word for her to have him f/u with Dr Rhea Belton concerning an earlier appt. Spoke with wife to inform her the [facility] NP Coordinator called and she has all the information to schedule pt to see Dr Truett Perna. Pt will have a good oncologist and the time frame from removal of the tumor to tx with chemo and or radiation is the most important time frame. Wife stated understanding.

## 2011-01-23 NOTE — Telephone Encounter (Signed)
Message copied by Florene Glen on Fri Jan 23, 2011  2:57 PM ------      Message from: Beverley Fiedler      Created: Fri Jan 23, 2011  1:21 PM       I staffed messaged him.  Still no response.      Suggestions?      ----- Message -----         From: Linna Hoff, RN         Sent: 01/23/2011  10:20 AM           To: Erick Blinks, MD            Dr Rhea Belton, pt is calling back; were you able to speak with Dr Daphine Deutscher about an earlier appt? Thanks, cb

## 2011-01-26 ENCOUNTER — Telehealth: Payer: Self-pay | Admitting: *Deleted

## 2011-01-26 NOTE — Telephone Encounter (Signed)
Wife called to see if pt's appt has been moved up. Dr Rhea Belton spoke with Dr Daphine Deutscher and appt moved up to 01/29/11 at 0900am; informed pt's wife.

## 2011-01-27 ENCOUNTER — Encounter: Payer: Medicare Other | Admitting: Oncology

## 2011-01-29 ENCOUNTER — Ambulatory Visit (INDEPENDENT_AMBULATORY_CARE_PROVIDER_SITE_OTHER): Payer: Medicare Other | Admitting: Surgery

## 2011-01-29 ENCOUNTER — Encounter (INDEPENDENT_AMBULATORY_CARE_PROVIDER_SITE_OTHER): Payer: Self-pay | Admitting: Surgery

## 2011-01-29 VITALS — BP 132/76 | HR 58 | Temp 97.5°F | Resp 16 | Ht 72.0 in | Wt 177.2 lb

## 2011-01-29 DIAGNOSIS — C187 Malignant neoplasm of sigmoid colon: Secondary | ICD-10-CM

## 2011-01-29 NOTE — Patient Instructions (Signed)
[facility]  TWO-DAY (2) PRE-OP HOME COLON PREP INSTRUCTIONS  Purchase 2 bottles of Magnesium Citrate and both ANTIBIOTICS at pharmacy of your choice, at least 2 days before Prep Day.  Follow these instructions carefully.  If you have questions or problems, please call and speak to a member of our clinic staff: [phone].  TWO DAYS BEFORE YOUR SURGERY:   Drink FULL LIQUIDS all day (eat no solid food.)   Drink 6 EXTRA glasses of water during that day (8 oz).   7pm:  Drink 1 bottle of Magnesium Citrate    7:30pm-8pm-8:30pm:  Drink 8 oz water.   ONE DAY BEFORE YOUR SURGERY:   Drink CLEAR LIQUIDS all day (eat no solid food.)   8am:  Drink 1 bottle of Magnesium Citrate   8:30am-9am-9:30am:  Drink 8 oz water.   AFTERNOON:  Take both antibiotics at the times listed on the bottles.   CLEAR LIQUIDS:  You may continue to drink clear liquids until midnight.   MIDNIGHT:  nothing to eat or drink after midnight.   FULL LIQUIDS:  Any liquids, including soups-but without vegetables, fruits, meats, or milk products.  NO dairy products, even in soups.  CLEAR LIQUIDS:  Clear liquids are those liquids you can see through.  Examples:  tea, black coffee, jello (avoid red jello), soda, water, apple or white grape juice, soup broth.  >> Do NOT consume milk, dairy products, veggies, meat, or fruit additives. <<  NPO AFTER MIDNIGHT:  Do NOT eat or drink anything after midnight before your surgery.    Further pre-operative instructions will be given to you from the hospital.  Expect to be contacted 5-7 days before your surgery.

## 2011-01-29 NOTE — Progress Notes (Signed)
Subjective:     Patient ID: Mark Clayton, male   DOB: 01-08-1941, 70 y.o.   MRN: [id number]  HPI  Mr. And Mrs. Mark Clayton in today to discuss his recent diagnosis of sigmoid colon cancer. I had performed a inguinal hernia on [name] in 2000. Recently he was having some cardiac issues and prior to undergoing a cardiac catheter had a hemoglobin noted of 7. This led to a workup showing a circumferential mass in the sigmoid colon with adenopathy suggesting more advanced disease. CT scan of the liver has not shown anything definitively although there some tiny little areas one of which looks like it's on Glisson's capsule there were questionable. I reviewed Dr. Vonna Kotyk Clayton's colonoscopic photos and the CT scan.    I discussed the laparoscopic assisted sigmoid colectomy with him and his wife. He understands the risk of the temporary colostomy. He is eager to go and get this set up as soon as possible. He may need a 2 day bowel prep to get him prepared for this. I gave him a booklet on laparoscopically assisted colectomy for him to study. He was to proceed as soon as possible. Patient Active Problem List  Diagnoses  . HYPERLIPIDEMIA, MILD  . DEHYDRATION  . AORTIC STENOSIS, MILD  . CONGESTIVE HEART FAILURE, SYSTOLIC, CHRONIC  . LEG CRAMPS  . Chest pain  . ischemic cardiomyopathy s/p CABG   . Cardiac defibrillator  MDT VVI  . Microcytic anemia   Past Medical History  Diagnosis Date  . Ischemic cardiomyopathy     proir bypass. EF 25%. (Master study EF >20%)  . HTN (hypertension)     systolic  . CAD (coronary artery disease)     severe 3 vessel CAD w/unstable angina and acute MI  . Anemia   . Myocardial infarct   . Carotid bruit   . Hyperlipidemia    Past Surgical History  Procedure Date  . Cardiac defibrillator placement 01/17/03    6949 lead. medtronic. remote-no; with later revision  . Inguinal hernia repair     right  . Coronary artery bypass graft 2003   Allergies  Allergen  Reactions  . Rosuvastatin     REACTION: leg pain   Current Outpatient Prescriptions  Medication Sig Dispense Refill  . aspirin 81 MG tablet Take 81 mg by mouth daily.        . carvedilol (COREG) 25 MG tablet Take 12.5 mg by mouth 1 day or 1 dose. Take in the pm       . carvedilol (COREG) 6.25 MG tablet Take 6.25 mg by mouth every morning.        Marland Kitchen FeFum-FePoly-FA-B Cmp-C-Biot (INTEGRA PLUS) CAPS Take 1 capsule by mouth once daily  90 capsule  3  . furosemide (LASIX) 20 MG tablet Take 40 mg alternate with 20 mg every other day for seven days.  Then resume 20 mg daily.  90 tablet  3  . nitroGLYCERIN (NITROSTAT) 0.4 MG SL tablet Place 0.4 mg under the tongue every 5 (five) minutes as needed.        Marland Kitchen omeprazole (PRILOSEC) 20 MG capsule Take 1 capsule (20 mg total) by mouth daily.  90 capsule  3  . simvastatin (ZOCOR) 80 MG tablet Take 0.5 tablets (40 mg total) by mouth at bedtime.  90 tablet  3      Review of Systems  Constitutional: Positive for activity change.  HENT: Negative.   Eyes: Negative.   Respiratory: Negative.  Cardiovascular: Negative.   Gastrointestinal: Positive for blood in stool.  Genitourinary: Negative.   Musculoskeletal: Negative.   Skin: Positive for pallor.  Neurological: Negative.   Hematological: Negative.   Psychiatric/Behavioral: Negative.        Objective:   Physical Exam  Constitutional: He is oriented to person, place, and time. He appears well-developed and well-nourished.  HENT:  Head: Normocephalic and atraumatic.  Eyes: Conjunctivae are normal. Pupils are equal, round, and reactive to light.  Neck: Normal range of motion.       No bruits  Cardiovascular: Normal rate, regular rhythm and normal heart sounds.   Pulmonary/Chest: Effort normal and breath sounds normal.  Abdominal: Soft.  Musculoskeletal: Normal range of motion. He exhibits no edema.  Neurological: He is alert and oriented to person, place, and time.  Skin: Skin is warm and  dry.  Psychiatric: He has a normal mood and affect. His behavior is normal. Judgment and thought content normal.       Assessment:     Advanced sigmoid colon cancer    Plan:     Lap assisted sigmoid colectomy

## 2011-01-31 ENCOUNTER — Telehealth: Payer: Self-pay | Admitting: Oncology

## 2011-01-31 NOTE — Telephone Encounter (Signed)
S/w rhe pt and he is aware of his new pt appt on 03/02/2011@1 :30pm with dr Truett Perna

## 2011-02-03 ENCOUNTER — Encounter (HOSPITAL_COMMUNITY): Payer: Self-pay | Admitting: Pharmacy Technician

## 2011-02-05 ENCOUNTER — Encounter (INDEPENDENT_AMBULATORY_CARE_PROVIDER_SITE_OTHER): Payer: Self-pay | Admitting: Surgery

## 2011-02-06 ENCOUNTER — Encounter (HOSPITAL_COMMUNITY): Payer: Medicare Other

## 2011-02-06 ENCOUNTER — Encounter (HOSPITAL_COMMUNITY): Payer: Self-pay

## 2011-02-06 DIAGNOSIS — D649 Anemia, unspecified: Secondary | ICD-10-CM

## 2011-02-06 DIAGNOSIS — K219 Gastro-esophageal reflux disease without esophagitis: Secondary | ICD-10-CM

## 2011-02-06 HISTORY — DX: Anemia, unspecified: D64.9

## 2011-02-06 HISTORY — DX: Gastro-esophageal reflux disease without esophagitis: K21.9

## 2011-02-06 LAB — SURGICAL PCR SCREEN
MRSA, PCR: NEGATIVE
Staphylococcus aureus: NEGATIVE

## 2011-02-06 LAB — BASIC METABOLIC PANEL
BUN: 10 mg/dL (ref 6–23)
CO2: 30 mEq/L (ref 19–32)
Calcium: 9.9 mg/dL (ref 8.4–10.5)
Chloride: 106 mEq/L (ref 96–112)
Creatinine, Ser: 0.89 mg/dL (ref 0.50–1.35)
GFR calc Af Amer: >90 mL/min (ref >90)
GFR calc non Af Amer: 85 mL/min — ABNORMAL LOW (ref >90)
Glucose, Bld: 91 mg/dL (ref 70–99)
Potassium: 4.5 mEq/L (ref 3.5–5.1)
Sodium: 140 mEq/L (ref 135–145)

## 2011-02-06 LAB — CBC
HCT: 36.6 % — ABNORMAL LOW (ref 39.0–52.0)
Hemoglobin: 10.8 g/dL — ABNORMAL LOW (ref 13.0–17.0)
MCH: 22.8 pg — ABNORMAL LOW (ref 26.0–34.0)
MCHC: 29.5 g/dL — ABNORMAL LOW (ref 30.0–36.0)
MCV: 77.4 fL — ABNORMAL LOW (ref 78.0–100.0)
Platelets: 204 10*3/uL (ref 150–400)
RBC: 4.73 MIL/uL (ref 4.22–5.81)
RDW: 25.1 % — ABNORMAL HIGH (ref 11.5–15.5)
WBC: 6.2 10*3/uL (ref 4.0–10.5)

## 2011-02-06 NOTE — Pre-Procedure Instructions (Addendum)
02-06-11 ICD/Pacemaker orders signed with chart. Ct chest(01-20-11), Echo(02-04-10) with chart. EKG (12-26-10) with chart.

## 2011-02-06 NOTE — Patient Instructions (Addendum)
20 DEVAL MROCZKA  02/06/2011   Your procedure is scheduled on:  02-09-11  Report to [facility] at   0530 AM.  Call this number if you have problems the morning of surgery: [phone]   Remember:   Do not eat food:After Midnight.Follow bowel prep instructions per Dr. Ermalene Searing office x 2 days prior  Do not drink clear liquids: After Midnight.02-08-11  Take these medicines the morning of surgery with A SIP OF WATER: Omeprazole, Carvedilol   Do not wear jewelry, make-up or nail polish.  Do not wear lotions, powders, or perfumes. You may wear deodorant.  Do not shave 48 hours prior to surgery.  Do not bring valuables to the hospital.  Contacts, dentures or bridgework may not be worn into surgery.  Leave suitcase in the car. After surgery it may be brought to your room.  For patients admitted to the hospital, checkout time is 11:00 AM the day of discharge.   Patients discharged the day of surgery will not be allowed to drive home.  Name and phone number of your driver: Tayari Yankee [phone]  Special Instructions: CHG Shower Use Special Wash: 1/2 bottle night before surgery and 1/2 bottle morning of surgery.   Please read over the following fact sheets that you were given: Blood Transfusion Information and MRSA Information

## 2011-02-09 ENCOUNTER — Encounter (HOSPITAL_COMMUNITY): Payer: Self-pay

## 2011-02-09 ENCOUNTER — Other Ambulatory Visit (INDEPENDENT_AMBULATORY_CARE_PROVIDER_SITE_OTHER): Payer: Self-pay | Admitting: Surgery

## 2011-02-09 ENCOUNTER — Inpatient Hospital Stay (HOSPITAL_COMMUNITY)
Admission: RE | Admit: 2011-02-09 | Discharge: 2011-02-14 | DRG: 330 | Disposition: A | Discharge status: Home / Self Care | Payer: Medicare Other | Source: Ambulatory Visit | Attending: Surgery | Admitting: Surgery

## 2011-02-09 ENCOUNTER — Encounter (HOSPITAL_COMMUNITY): Payer: Self-pay | Admitting: *Deleted

## 2011-02-09 ENCOUNTER — Inpatient Hospital Stay (HOSPITAL_COMMUNITY): Payer: Medicare Other | Admitting: *Deleted

## 2011-02-09 ENCOUNTER — Encounter (HOSPITAL_COMMUNITY)
Admission: RE | Disposition: A | Discharge status: Home / Self Care | Payer: Self-pay | Source: Ambulatory Visit | Attending: Surgery

## 2011-02-09 DIAGNOSIS — C189 Malignant neoplasm of colon, unspecified: Secondary | ICD-10-CM

## 2011-02-09 DIAGNOSIS — I252 Old myocardial infarction: Secondary | ICD-10-CM

## 2011-02-09 DIAGNOSIS — I2510 Atherosclerotic heart disease of native coronary artery without angina pectoris: Secondary | ICD-10-CM | POA: Diagnosis present

## 2011-02-09 DIAGNOSIS — Q438 Other specified congenital malformations of intestine: Secondary | ICD-10-CM

## 2011-02-09 DIAGNOSIS — Z01812 Encounter for preprocedural laboratory examination: Secondary | ICD-10-CM

## 2011-02-09 DIAGNOSIS — E785 Hyperlipidemia, unspecified: Secondary | ICD-10-CM | POA: Diagnosis present

## 2011-02-09 DIAGNOSIS — I2589 Other forms of chronic ischemic heart disease: Secondary | ICD-10-CM | POA: Diagnosis present

## 2011-02-09 DIAGNOSIS — Z951 Presence of aortocoronary bypass graft: Secondary | ICD-10-CM

## 2011-02-09 DIAGNOSIS — C187 Malignant neoplasm of sigmoid colon: Principal | ICD-10-CM | POA: Diagnosis present

## 2011-02-09 DIAGNOSIS — Z95810 Presence of automatic (implantable) cardiac defibrillator: Secondary | ICD-10-CM

## 2011-02-09 DIAGNOSIS — I509 Heart failure, unspecified: Secondary | ICD-10-CM | POA: Diagnosis present

## 2011-02-09 DIAGNOSIS — I359 Nonrheumatic aortic valve disorder, unspecified: Secondary | ICD-10-CM | POA: Diagnosis present

## 2011-02-09 DIAGNOSIS — I10 Essential (primary) hypertension: Secondary | ICD-10-CM | POA: Diagnosis present

## 2011-02-09 DIAGNOSIS — I5022 Chronic systolic (congestive) heart failure: Secondary | ICD-10-CM | POA: Diagnosis present

## 2011-02-09 HISTORY — PX: COLON RESECTION: SHX5231

## 2011-02-09 LAB — CBC
HCT: 32.8 % — ABNORMAL LOW (ref 39.0–52.0)
Hemoglobin: 10.2 g/dL — ABNORMAL LOW (ref 13.0–17.0)
MCH: 23.7 pg — ABNORMAL LOW (ref 26.0–34.0)
MCHC: 31.1 g/dL (ref 30.0–36.0)
MCV: 76.3 fL — ABNORMAL LOW (ref 78.0–100.0)
Platelets: 153 10*3/uL (ref 150–400)
RBC: 4.3 MIL/uL (ref 4.22–5.81)
RDW: 23.9 % — ABNORMAL HIGH (ref 11.5–15.5)
WBC: 9.2 10*3/uL (ref 4.0–10.5)

## 2011-02-09 LAB — CREATININE, SERUM
Creatinine, Ser: 0.84 mg/dL (ref 0.50–1.35)
GFR calc Af Amer: >90 mL/min (ref >90)
GFR calc non Af Amer: 87 mL/min — ABNORMAL LOW (ref >90)

## 2011-02-09 LAB — TYPE AND SCREEN
ABO/RH(D): B POS
Antibody Screen: NEGATIVE

## 2011-02-09 LAB — ABO/RH: ABO/RH(D): B POS

## 2011-02-09 SURGERY — LAPAROSCOPIC SIGMOID COLON RESECTION
Anesthesia: General | Site: Abdomen | Wound class: Clean Contaminated

## 2011-02-09 MED ORDER — SODIUM CHLORIDE 0.9 % IR SOLN
Status: DC | PRN
  Administered 2011-02-09: 2000 mL

## 2011-02-09 MED ORDER — ACETAMINOPHEN 10 MG/ML IV SOLN
1000.0000 mg | Freq: Four times a day (QID) | INTRAVENOUS | Status: AC
  Administered 2011-02-09 – 2011-02-10 (×4): 1000 mg via INTRAVENOUS
  Filled 2011-02-09 (×4): qty 100

## 2011-02-09 MED ORDER — ONDANSETRON HCL 4 MG/2ML IJ SOLN
4.0000 mg | Freq: Four times a day (QID) | INTRAMUSCULAR | Status: DC | PRN
  Administered 2011-02-09: 4 mg via INTRAVENOUS
  Filled 2011-02-09 (×2): qty 2

## 2011-02-09 MED ORDER — STERILE WATER FOR IRRIGATION IR SOLN
Status: DC | PRN
  Administered 2011-02-09: 1000 mL

## 2011-02-09 MED ORDER — METOCLOPRAMIDE HCL 5 MG/ML IJ SOLN: 10.0000 mg | Freq: Four times a day (QID) | INTRAMUSCULAR | Status: DC | PRN

## 2011-02-09 MED ORDER — HYDROMORPHONE HCL PF 1 MG/ML IJ SOLN
INTRAMUSCULAR | Status: DC | PRN
  Administered 2011-02-09: 0.5 mg via INTRAVENOUS
  Administered 2011-02-09: 1 mg via INTRAVENOUS
  Administered 2011-02-09: 0.5 mg via INTRAVENOUS

## 2011-02-09 MED ORDER — ACETAMINOPHEN 10 MG/ML IV SOLN
INTRAVENOUS | Status: DC | PRN
  Administered 2011-02-09: 1000 mg via INTRAVENOUS

## 2011-02-09 MED ORDER — MIDAZOLAM HCL 5 MG/5ML IJ SOLN
INTRAMUSCULAR | Status: DC | PRN
  Administered 2011-02-09 (×2): 1 mg via INTRAVENOUS

## 2011-02-09 MED ORDER — PROMETHAZINE HCL 25 MG/ML IJ SOLN: 6.2500 mg | INTRAMUSCULAR | Status: DC | PRN

## 2011-02-09 MED ORDER — NITROGLYCERIN 0.4 MG SL SUBL: 0.4000 mg | SUBLINGUAL_TABLET | SUBLINGUAL | Status: DC | PRN

## 2011-02-09 MED ORDER — SODIUM CHLORIDE 0.9 % IV SOLN
25.0000 mg | Freq: Four times a day (QID) | INTRAVENOUS | Status: DC | PRN
  Administered 2011-02-10: 25 mg via INTRAVENOUS
  Filled 2011-02-09 (×3): qty 1

## 2011-02-09 MED ORDER — LIDOCAINE HCL (CARDIAC) 20 MG/ML IV SOLN
INTRAVENOUS | Status: DC | PRN
  Administered 2011-02-09: 100 mg via INTRAVENOUS

## 2011-02-09 MED ORDER — HEPARIN SODIUM (PORCINE) 5000 UNIT/ML IJ SOLN
5000.0000 [IU] | Freq: Once | INTRAMUSCULAR | Status: AC
  Administered 2011-02-09: 5000 [IU] via SUBCUTANEOUS

## 2011-02-09 MED ORDER — ALVIMOPAN 12 MG PO CAPS
12.0000 mg | ORAL_CAPSULE | Freq: Once | ORAL | Status: AC
  Administered 2011-02-09: 12 mg via ORAL

## 2011-02-09 MED ORDER — CARVEDILOL 6.25 MG PO TABS
6.2500 mg | ORAL_TABLET | ORAL | Status: DC
  Administered 2011-02-10 – 2011-02-14 (×5): 6.25 mg via ORAL
  Filled 2011-02-09 (×6): qty 1

## 2011-02-09 MED ORDER — PROPOFOL 10 MG/ML IV EMUL
INTRAVENOUS | Status: DC | PRN
  Administered 2011-02-09: 180 mg via INTRAVENOUS

## 2011-02-09 MED ORDER — DEXTROSE 5 % IV SOLN
1.0000 g | Freq: Once | INTRAVENOUS | Status: AC
  Administered 2011-02-09: 1 g via INTRAVENOUS
  Filled 2011-02-09: qty 1

## 2011-02-09 MED ORDER — NEOSTIGMINE METHYLSULFATE 1 MG/ML IJ SOLN
INTRAMUSCULAR | Status: DC | PRN
  Administered 2011-02-09: 4 mg via INTRAVENOUS

## 2011-02-09 MED ORDER — ACETAMINOPHEN 10 MG/ML IV SOLN
INTRAVENOUS | Status: AC
  Filled 2011-02-09: qty 100

## 2011-02-09 MED ORDER — HYDROMORPHONE HCL PF 1 MG/ML IJ SOLN
0.2500 mg | INTRAMUSCULAR | Status: DC | PRN
Start: 2011-02-09 — End: 2011-02-09

## 2011-02-09 MED ORDER — BUPIVACAINE LIPOSOME 1.3 % IJ SUSP
20.0000 mL | Freq: Once | INTRAMUSCULAR | Status: AC
  Administered 2011-02-09: 20 mL
  Filled 2011-02-09: qty 20

## 2011-02-09 MED ORDER — CHLORPROMAZINE HCL 25 MG/ML IJ SOLN
25.0000 mg | Freq: Four times a day (QID) | INTRAMUSCULAR | Status: DC | PRN
  Filled 2011-02-09: qty 1

## 2011-02-09 MED ORDER — DIPHENHYDRAMINE HCL 12.5 MG/5ML PO ELIX
12.5000 mg | ORAL_SOLUTION | Freq: Four times a day (QID) | ORAL | Status: DC | PRN
  Filled 2011-02-09: qty 5

## 2011-02-09 MED ORDER — ALVIMOPAN 12 MG PO CAPS
12.0000 mg | ORAL_CAPSULE | Freq: Once | ORAL | Status: AC
  Administered 2011-02-09: 12 mg via ORAL
  Filled 2011-02-09: qty 1

## 2011-02-09 MED ORDER — ASPIRIN 81 MG PO CHEW
81.0000 mg | CHEWABLE_TABLET | Freq: Every day | ORAL | Status: DC
  Administered 2011-02-09 – 2011-02-13 (×5): 81 mg via ORAL
  Filled 2011-02-09 (×6): qty 1

## 2011-02-09 MED ORDER — ONDANSETRON HCL 4 MG/2ML IJ SOLN
INTRAMUSCULAR | Status: DC | PRN
  Administered 2011-02-09: 4 mg via INTRAVENOUS

## 2011-02-09 MED ORDER — NALOXONE HCL 0.4 MG/ML IJ SOLN: 0.4000 mg | INTRAMUSCULAR | Status: DC | PRN

## 2011-02-09 MED ORDER — KCL IN DEXTROSE-NACL 20-5-0.45 MEQ/L-%-% IV SOLN
INTRAVENOUS | Status: DC
  Administered 2011-02-09 – 2011-02-12 (×6): via INTRAVENOUS
  Administered 2011-02-12: 100 mL via INTRAVENOUS
  Administered 2011-02-12: 100 mL/h via INTRAVENOUS
  Administered 2011-02-12 – 2011-02-13 (×3): via INTRAVENOUS
  Filled 2011-02-09 (×17): qty 1000

## 2011-02-09 MED ORDER — ALVIMOPAN 12 MG PO CAPS
ORAL_CAPSULE | ORAL | Status: AC
  Filled 2011-02-09: qty 1

## 2011-02-09 MED ORDER — GLYCOPYRROLATE 0.2 MG/ML IJ SOLN
INTRAMUSCULAR | Status: DC | PRN
  Administered 2011-02-09: .6 mg via INTRAVENOUS

## 2011-02-09 MED ORDER — PROMETHAZINE HCL 25 MG/ML IJ SOLN
12.5000 mg | Freq: Four times a day (QID) | INTRAMUSCULAR | Status: DC | PRN
  Administered 2011-02-09: 12.5 mg via INTRAVENOUS
  Filled 2011-02-09: qty 1

## 2011-02-09 MED ORDER — SODIUM CHLORIDE 0.9 % IJ SOLN: 9.0000 mL | INTRAMUSCULAR | Status: DC | PRN

## 2011-02-09 MED ORDER — HEPARIN SODIUM (PORCINE) 5000 UNIT/ML IJ SOLN
5000.0000 [IU] | Freq: Three times a day (TID) | INTRAMUSCULAR | Status: DC
  Administered 2011-02-09 – 2011-02-14 (×14): 5000 [IU] via SUBCUTANEOUS
  Filled 2011-02-09 (×18): qty 1

## 2011-02-09 MED ORDER — DIPHENHYDRAMINE HCL 50 MG/ML IJ SOLN: 12.5000 mg | Freq: Four times a day (QID) | INTRAMUSCULAR | Status: DC | PRN

## 2011-02-09 MED ORDER — MORPHINE SULFATE (PF) 1 MG/ML IV SOLN
INTRAVENOUS | Status: DC
  Administered 2011-02-09: 12:00:00 via INTRAVENOUS
  Filled 2011-02-09: qty 30

## 2011-02-09 MED ORDER — ROCURONIUM BROMIDE 100 MG/10ML IV SOLN
INTRAVENOUS | Status: DC | PRN
  Administered 2011-02-09: 50 mg via INTRAVENOUS
  Administered 2011-02-09: 10 mg via INTRAVENOUS

## 2011-02-09 MED ORDER — ALVIMOPAN 12 MG PO CAPS
12.0000 mg | ORAL_CAPSULE | Freq: Two times a day (BID) | ORAL | Status: DC
  Administered 2011-02-10 – 2011-02-13 (×7): 12 mg via ORAL
  Filled 2011-02-09 (×8): qty 1

## 2011-02-09 MED ORDER — BUPIVACAINE-EPINEPHRINE PF 0.25-1:200000 % IJ SOLN
INTRAMUSCULAR | Status: AC
  Filled 2011-02-09: qty 30

## 2011-02-09 MED ORDER — LACTATED RINGERS IV SOLN
INTRAVENOUS | Status: DC | PRN
  Administered 2011-02-09 (×4): via INTRAVENOUS

## 2011-02-09 MED ORDER — LACTATED RINGERS IR SOLN
Status: DC | PRN
  Administered 2011-02-09: 1000 mL

## 2011-02-09 MED ORDER — FENTANYL CITRATE 0.05 MG/ML IJ SOLN
INTRAMUSCULAR | Status: DC | PRN
Start: 2011-02-09 — End: 2011-02-09
  Administered 2011-02-09: 100 ug via INTRAVENOUS
  Administered 2011-02-09: 50 ug via INTRAVENOUS
  Administered 2011-02-09: 100 ug via INTRAVENOUS

## 2011-02-09 MED ORDER — HEPARIN SODIUM (PORCINE) 5000 UNIT/ML IJ SOLN
INTRAMUSCULAR | Status: AC
  Filled 2011-02-09: qty 1

## 2011-02-09 MED ORDER — CARVEDILOL 12.5 MG PO TABS
12.5000 mg | ORAL_TABLET | Freq: Every day | ORAL | Status: DC
  Administered 2011-02-09 – 2011-02-13 (×5): 12.5 mg via ORAL
  Filled 2011-02-09 (×7): qty 1

## 2011-02-09 MED ORDER — HYDROMORPHONE 0.3 MG/ML IV SOLN
INTRAVENOUS | Status: DC
  Administered 2011-02-09: 0.2 mg via INTRAVENOUS
  Administered 2011-02-09: 7.5 mg via INTRAVENOUS
  Administered 2011-02-10: 0.2 mg via INTRAVENOUS
  Administered 2011-02-10: 0.559 mg via INTRAVENOUS
  Administered 2011-02-11: 0.399 mg via INTRAVENOUS
  Administered 2011-02-11 (×3): 0.2 mg via INTRAVENOUS
  Filled 2011-02-09 (×2): qty 25

## 2011-02-09 SURGICAL SUPPLY — 84 items
APPLIER CLIP 5 13 M/L LIGAMAX5 (MISCELLANEOUS)
APPLIER CLIP ROT 10 11.4 M/L (STAPLE)
BLADE EXTENDED COATED 6.5IN (ELECTRODE) ×4 IMPLANT
BLADE HEX COATED 2.75 (ELECTRODE) ×2 IMPLANT
BLADE SURG SZ10 CARB STEEL (BLADE) ×2 IMPLANT
CANISTER SUCTION 2500CC (MISCELLANEOUS) ×2 IMPLANT
CELLS DAT CNTRL 66122 CELL SVR (MISCELLANEOUS) ×1 IMPLANT
CLAMP ENDO BABCK 10MM (STAPLE) IMPLANT
CLIP APPLIE 5 13 M/L LIGAMAX5 (MISCELLANEOUS) IMPLANT
CLIP APPLIE ROT 10 11.4 M/L (STAPLE) IMPLANT
CLOTH BEACON ORANGE TIMEOUT ST (SAFETY) ×2 IMPLANT
CONNECTOR 5 IN 1 STRAIGHT STRL (MISCELLANEOUS) IMPLANT
COVER MAYO STAND STRL (DRAPES) ×2 IMPLANT
COVER SURGICAL LIGHT HANDLE (MISCELLANEOUS) ×2 IMPLANT
DECANTER SPIKE VIAL GLASS SM (MISCELLANEOUS) IMPLANT
DEVICE TROCAR PUNCTURE CLOSURE (ENDOMECHANICALS) IMPLANT
DRAPE LAPAROSCOPIC ABDOMINAL (DRAPES) ×2 IMPLANT
DRAPE LG THREE QUARTER DISP (DRAPES) IMPLANT
DRAPE WARM FLUID 44X44 (DRAPE) ×2 IMPLANT
ELECT REM PT RETURN 9FT ADLT (ELECTROSURGICAL) ×2
ELECTRODE REM PT RTRN 9FT ADLT (ELECTROSURGICAL) ×1 IMPLANT
GAUZE SPONGE 4X4 12PLY STRL LF (GAUZE/BANDAGES/DRESSINGS) ×2 IMPLANT
GLOVE BIOGEL M 8.0 STRL (GLOVE) ×4 IMPLANT
GLOVE BIOGEL PI IND STRL 7.0 (GLOVE) ×1 IMPLANT
GLOVE BIOGEL PI INDICATOR 7.0 (GLOVE) ×1
GOWN STRL NON-REIN LRG LVL3 (GOWN DISPOSABLE) ×2 IMPLANT
GOWN STRL REIN XL XLG (GOWN DISPOSABLE) ×4 IMPLANT
HAND ACTIVATED (MISCELLANEOUS) IMPLANT
KIT BASIN OR (CUSTOM PROCEDURE TRAY) ×2 IMPLANT
LEGGING LITHOTOMY PAIR STRL (DRAPES) IMPLANT
LIGASURE IMPACT 36 18CM CVD LR (INSTRUMENTS) ×2 IMPLANT
NS IRRIG 1000ML POUR BTL (IV SOLUTION) ×4 IMPLANT
PENCIL BUTTON HOLSTER BLD 10FT (ELECTRODE) ×4 IMPLANT
RTRCTR WOUND ALEXIS 18CM MED (MISCELLANEOUS) ×2
SCALPEL HARMONIC ACE (MISCELLANEOUS) ×2 IMPLANT
SCISSORS LAP 5X35 DISP (ENDOMECHANICALS) IMPLANT
SEALER TISSUE G2 CVD JAW 35 (ENDOMECHANICALS) IMPLANT
SEALER TISSUE G2 CVD JAW 45CM (ENDOMECHANICALS)
SET IRRIG TUBING LAPAROSCOPIC (IRRIGATION / IRRIGATOR) ×2 IMPLANT
SLEEVE Z-THREAD 5X100MM (TROCAR) ×2 IMPLANT
SOLUTION ANTI FOG 6CC (MISCELLANEOUS) ×2 IMPLANT
SPONGE GAUZE 4X4 12PLY (GAUZE/BANDAGES/DRESSINGS) ×2 IMPLANT
SPONGE LAP 18X18 X RAY DECT (DISPOSABLE) ×4 IMPLANT
STAPLER CIRC CVD 29MM 37CM (STAPLE) ×2 IMPLANT
STAPLER CUT CVD 40MM GREEN (STAPLE) ×2 IMPLANT
STAPLER CUT RELOAD BLUE (STAPLE) ×4 IMPLANT
STAPLER VISISTAT 35W (STAPLE) ×2 IMPLANT
STRIP CLOSURE SKIN 1/2X4 (GAUZE/BANDAGES/DRESSINGS) ×2 IMPLANT
SUCTION POOLE TIP (SUCTIONS) ×2 IMPLANT
SUT NOVA NAB DX-16 0-1 5-0 T12 (SUTURE) ×4 IMPLANT
SUT PDS AB 1 CT1 27 (SUTURE) IMPLANT
SUT PDS AB 1 CTX 36 (SUTURE) IMPLANT
SUT PDS AB 4-0 SH 27 (SUTURE) IMPLANT
SUT PROLENE 2 0 KS (SUTURE) IMPLANT
SUT SILK 2 0 (SUTURE) ×2
SUT SILK 2 0 SH CR/8 (SUTURE) ×2 IMPLANT
SUT SILK 2-0 18XBRD TIE 12 (SUTURE) ×1 IMPLANT
SUT SILK 3 0 (SUTURE) ×2
SUT SILK 3 0 SH CR/8 (SUTURE) ×2 IMPLANT
SUT SILK 3-0 18XBRD TIE 12 (SUTURE) ×1 IMPLANT
SUT VIC AB 2-0 CT1 27 (SUTURE)
SUT VIC AB 2-0 CT1 27XBRD (SUTURE) IMPLANT
SUT VIC AB 2-0 SH 27 (SUTURE) ×2
SUT VIC AB 2-0 SH 27X BRD (SUTURE) ×1 IMPLANT
SUT VIC AB 3-0 PS2 18 (SUTURE)
SUT VIC AB 3-0 PS2 18XBRD (SUTURE) IMPLANT
SUT VIC AB 4-0 SH 18 (SUTURE) IMPLANT
SUT VICRYL 0 UR6 27IN ABS (SUTURE) ×2 IMPLANT
SYR 30ML LL (SYRINGE) ×2 IMPLANT
SYR BULB IRRIGATION 50ML (SYRINGE) ×2 IMPLANT
TAPE CLOTH SURG 4X10 WHT LF (GAUZE/BANDAGES/DRESSINGS) ×2 IMPLANT
TOWEL OR 17X26 10 PK STRL BLUE (TOWEL DISPOSABLE) ×4 IMPLANT
TRAY FOLEY CATH 14FRSI W/METER (CATHETERS) ×2 IMPLANT
TRAY LAP CHOLE (CUSTOM PROCEDURE TRAY) ×2 IMPLANT
TROCAR BLADELESS OPT 5 100 (ENDOMECHANICALS) IMPLANT
TROCAR BLADELESS OPT 5 75 (ENDOMECHANICALS) ×2 IMPLANT
TROCAR HASSON GELL 12X100 (TROCAR) IMPLANT
TROCAR Z-THREAD FIOS 11X100 BL (TROCAR) IMPLANT
TROCAR Z-THREAD FIOS 5X100MM (TROCAR) ×2 IMPLANT
TROCAR Z-THREAD SLEEVE 11X100 (TROCAR) IMPLANT
TUBING CONNECTING 10 (TUBING) IMPLANT
TUBING FILTER THERMOFLATOR (ELECTROSURGICAL) ×2 IMPLANT
YANKAUER SUCT BULB TIP 10FT TU (MISCELLANEOUS) ×2 IMPLANT
YANKAUER SUCT BULB TIP NO VENT (SUCTIONS) ×2 IMPLANT

## 2011-02-09 NOTE — Progress Notes (Signed)
Pt c/o nausea. Medicated w/ Zofran earlier (See MAR.) Dr Daphine Deutscher aware. New orders for Phenergan received. Pt states pain well controlled. Dsgs c/d/i.

## 2011-02-09 NOTE — Progress Notes (Signed)
Pt arrived to unit from PACU, slid self over to bed w/ mod assist. Assessment as charted. Pt and family oriented to callbell and environment. PCA use reviewed w/ pt and family. Pain mgmt discussed. Dsgs to abd x 3 d/c/i. Foley draining clear/yellow urine. No c/o at present. Will cont to monitor.

## 2011-02-09 NOTE — Transfer of Care (Signed)
Immediate Anesthesia Transfer of Care Note  Patient: Mark Clayton  Procedure(s) Performed:  LAPAROSCOPIC SIGMOID COLON RESECTION - Laparoscopic Assisted Sigmoid Colectomy  Patient Location: PACU  Anesthesia Type: General  Level of Consciousness: awake and alert   Airway & Oxygen Therapy: Patient Spontanous Breathing and Patient connected to face mask oxygen  Post-op Assessment: Report given to PACU RN  Post vital signs: Reviewed and stable  Complications: No apparent anesthesia complications

## 2011-02-09 NOTE — Anesthesia Postprocedure Evaluation (Signed)
  Anesthesia Post-op Note  Patient: Mark Clayton  Procedure(s) Performed:  LAPAROSCOPIC SIGMOID COLON RESECTION - Laparoscopic Assisted Sigmoid Colectomy  Patient Location: PACU  Anesthesia Type: General  Level of Consciousness: awake and alert   Airway and Oxygen Therapy: Patient Spontanous Breathing  Post-op Pain: mild  Post-op Assessment: Post-op Vital signs reviewed, Patient's Cardiovascular Status Stable, Respiratory Function Stable, Patent Airway and No signs of Nausea or vomiting  Post-op Vital Signs: stable  Complications: No apparent anesthesia complications 

## 2011-02-09 NOTE — Plan of Care (Signed)
Problem: Phase I Progression Outcomes Goal: Initial discharge plan identified Outcome: Completed/Met Date Met:  02/09/11 Pt plans to return home upon D/C.

## 2011-02-09 NOTE — Anesthesia Preprocedure Evaluation (Addendum)
Anesthesia Evaluation  Patient identified by MRN, date of birth, ID band Patient awake    Reviewed: Allergy & Precautions, H&P , NPO status , Patient's Chart, lab work & pertinent test results, reviewed documented beta blocker date and time   History of Anesthesia Complications Negative for: history of anesthetic complications  Airway Mallampati: II TM Distance: >3 FB Neck ROM: Full    Dental  (+) Missing and Chipped,    Pulmonary neg pulmonary ROS,  clear to auscultation  Pulmonary exam normal       Cardiovascular hypertension, Pt. on medications + CAD, + Past MI and +CHF + pacemaker + Cardiac Defibrillator     Neuro/Psych Negative Neurological ROS  Negative Psych ROS   GI/Hepatic Neg liver ROS, GERD-  Medicated,  Endo/Other  Negative Endocrine ROS  Renal/GU negative Renal ROS  Genitourinary negative   Musculoskeletal negative musculoskeletal ROS (+)   Abdominal Normal abdominal exam  (+)   Peds negative pediatric ROS (+)  Hematology negative hematology ROS (+)   Anesthesia Other Findings   Reproductive/Obstetrics negative OB ROS                         Anesthesia Physical Anesthesia Plan  ASA: III  Anesthesia Plan: General   Post-op Pain Management:    Induction: Intravenous  Airway Management Planned: Oral ETT  Additional Equipment:   Intra-op Plan:   Post-operative Plan:   Informed Consent: I have reviewed the patients History and Physical, chart, labs and discussed the procedure including the risks, benefits and alternatives for the proposed anesthesia with the patient or authorized representative who has indicated his/her understanding and acceptance.   Dental advisory given  Plan Discussed with: CRNA and Surgeon  Anesthesia Plan Comments:         Anesthesia Quick Evaluation

## 2011-02-09 NOTE — H&P (Signed)
Patient ID: Mark Clayton, male DOB: [birth year], 70 y.o. MRN: [id number]  HPI [name] in today to discuss his recent diagnosis of sigmoid colon cancer. I had performed a inguinal hernia on Mr. Stanislawski in 2000. Recently he was having some cardiac issues and prior to undergoing a cardiac catheter had a hemoglobin noted of 7. This led to a workup showing a circumferential mass in the sigmoid colon with adenopathy suggesting more advanced disease. CT scan of the liver has not shown anything definitively although there some tiny little areas one of which looks like it's on Glisson's capsule there were questionable. I reviewed Dr. Vonna Kotyk Pyrtle's colonoscopic photos and the CT scan.  I discussed the laparoscopic assisted sigmoid colectomy with him and his wife. He understands the risk of the temporary colostomy. He is eager to go and get this set up as soon as possible. He may need a 2 day bowel prep to get him prepared for this. I gave him a booklet on laparoscopically assisted colectomy for him to study. He was to proceed as soon as possible.  Patient Active Problem List   Diagnoses   .  HYPERLIPIDEMIA, MILD   .  DEHYDRATION   .  AORTIC STENOSIS, MILD   .  CONGESTIVE HEART FAILURE, SYSTOLIC, CHRONIC   .  LEG CRAMPS   .  Chest pain   .  ischemic cardiomyopathy s/p CABG   .  Cardiac defibrillator MDT VVI   .  Microcytic anemia    Past Medical History   Diagnosis  Date   .  Ischemic cardiomyopathy      proir bypass. EF 25%. (Master study EF >20%)   .  HTN (hypertension)      systolic   .  CAD (coronary artery disease)      severe 3 vessel CAD w/unstable angina and acute MI   .  Anemia    .  Myocardial infarct    .  Carotid bruit    .  Hyperlipidemia     Past Surgical History   Procedure  Date   .  Cardiac defibrillator placement  01/17/03     6949 lead. medtronic. remote-no; with later revision   .  Inguinal hernia repair      right   .  Coronary artery bypass graft  2003     Allergies   Allergen  Reactions   .  Rosuvastatin      REACTION: leg pain    Current Outpatient Prescriptions   Medication  Sig  Dispense  Refill   .  aspirin 81 MG tablet  Take 81 mg by mouth daily.     .  carvedilol (COREG) 25 MG tablet  Take 12.5 mg by mouth 1 day or 1 dose. Take in the pm     .  carvedilol (COREG) 6.25 MG tablet  Take 6.25 mg by mouth every morning.     Marland Kitchen  FeFum-FePoly-FA-B Cmp-C-Biot (INTEGRA PLUS) CAPS  Take 1 capsule by mouth once daily  90 capsule  3   .  furosemide (LASIX) 20 MG tablet  Take 40 mg alternate with 20 mg every other day for seven days. Then resume 20 mg daily.  90 tablet  3   .  nitroGLYCERIN (NITROSTAT) 0.4 MG SL tablet  Place 0.4 mg under the tongue every 5 (five) minutes as needed.     Marland Kitchen  omeprazole (PRILOSEC) 20 MG capsule  Take 1 capsule (20 mg total) by mouth  daily.  90 capsule  3   .  simvastatin (ZOCOR) 80 MG tablet  Take 0.5 tablets (40 mg total) by mouth at bedtime.  90 tablet  3    Review of Systems  Constitutional: Positive for activity change.  HENT: Negative.  Eyes: Negative.  Respiratory: Negative.  Cardiovascular: Negative.  Gastrointestinal: Positive for blood in stool.  Genitourinary: Negative.  Musculoskeletal: Negative.  Skin: Positive for pallor.  Neurological: Negative.  Hematological: Negative.  Psychiatric/Behavioral: Negative.    Objective:   Physical Exam  Constitutional: He is oriented to person, place, and time. He appears well-developed and well-nourished.  HENT:  Head: Normocephalic and atraumatic.  Eyes: Conjunctivae are normal. Pupils are equal, round, and reactive to light.  Neck: Normal range of motion.  No bruits  Cardiovascular: Normal rate, regular rhythm and normal heart sounds.  Pulmonary/Chest: Effort normal and breath sounds normal.  Abdominal: Soft.  Musculoskeletal: Normal range of motion. He exhibits no edema.  Neurological: He is alert and oriented to person, place, and time.  Skin:  Skin is warm and dry.  Psychiatric: He has a normal mood and affect. His behavior is normal. Judgment and thought content normal.    Assessment:    Advanced sigmoid colon cancer   Plan:    Lap assisted sigmoid colectomy  There has been no change in the patient's past medical history or physical exam in the past 24 hours to the best of my knowledge.  Bowel prep complete.  Aware of possibility of temporary colostomy Expectations and outcome results have been discussed with the patient to include risks and benefits.  All questions have been answered and will proceed with previously discussed procedure noted and signed in the consent form in the patient's record.    Austen Wygant BMD7:23 AMTD@

## 2011-02-09 NOTE — Op Note (Signed)
Stager laparoscopically assisted sigmoid colectomy Surgeon Daphine Deutscher Asst. [facility]   Description of procedure: Patient was taken to room 11 on Monday, November 12. Informed consent had been obtained and after general anesthesia was administered he was prepped with PCMX and draped sterilely. He was in the dorsal lithotomy position. A timeout was performed  The abdomen was entered using a 5 mm 0 Optiview technique the right upper quadrant establishing a pneumoperitoneum and placing 2 other 5 mm trochars one in the lower midline and one in the right lower quadrant. Through these identify the tattooed area down in the pelvis and found fairly redundant sigmoid colon. Using harmonic scalpel to incise the peritoneal reflection and mobilized the descending colon in the sigmoid. The right side of the tumor mass was stuck to some of the mesentery and I elected to make my lower midline incision early to do that with palpation guidance. A Mentor none centimeter incision down toward the pubis from the 5 mm trocar site, placed the wound protector, and mobilize the tumor. To do this I had to take down the aforementioned adhesion with harmonic scalpel and once I did this I was able to get the tumor up into the out of the wound easily. I chose a site distally for the distal margin. Prior to doing a resection identify the left ureter and it was well out of harm's way. The distal bowel was divided with the contour stapler using a green load. The mesentery was divided with the LigaSure. Proximally it was divided with a contour blue load.  I performed a EEA side to end  Anastomosis Stevenson Clinch) using a [address] The anvil was introduced through an opening in the distal end of the staple line. This was then stapled with another application of the contour to seal the area off. It was then brought down into the pelvis by Dr. Johna Sheriff  While I went below and introduced the stapler. We coupled the device  is, closed them, held compression for 20 seconds and then fired. The resultant anastomotic rings were both intact and insufflation of the anastomosis under pressure using a rigid sigmoidoscope which had good demonstrated no air bubbles or evidence of leak.  Gloves and  garments were changed. Sponge and needle counts were reported as correct.  The peritoneum of the lower midline incision was closed with a running 2-0 Vicryl. Expariel  was infiltrated into the fascia which was closed with interrupted #1 Novafils. Wounds were irrigated closed with staples. Patient tolerated procedure well and was taken to the recovery room in satisfactory condition. He will be admitted.

## 2011-02-09 NOTE — Anesthesia Postprocedure Evaluation (Signed)
  Anesthesia Post-op Note  Patient: Mark Clayton  Procedure(s) Performed:  LAPAROSCOPIC SIGMOID COLON RESECTION - Laparoscopic Assisted Sigmoid Colectomy  Patient Location: PACU  Anesthesia Type: General  Level of Consciousness: awake and alert   Airway and Oxygen Therapy: Patient Spontanous Breathing  Post-op Pain: mild  Post-op Assessment: Post-op Vital signs reviewed, Patient's Cardiovascular Status Stable, Respiratory Function Stable, Patent Airway and No signs of Nausea or vomiting  Post-op Vital Signs: stable  Complications: No apparent anesthesia complications

## 2011-02-10 LAB — CBC
HCT: 30.7 % — ABNORMAL LOW (ref 39.0–52.0)
Hemoglobin: 9.2 g/dL — ABNORMAL LOW (ref 13.0–17.0)
MCH: 23.4 pg — ABNORMAL LOW (ref 26.0–34.0)
MCHC: 30 g/dL (ref 30.0–36.0)
MCV: 78.1 fL (ref 78.0–100.0)
Platelets: 135 10*3/uL — ABNORMAL LOW (ref 150–400)
RBC: 3.93 MIL/uL — ABNORMAL LOW (ref 4.22–5.81)
RDW: 24.1 % — ABNORMAL HIGH (ref 11.5–15.5)
WBC: 5.6 10*3/uL (ref 4.0–10.5)

## 2011-02-10 LAB — BASIC METABOLIC PANEL
BUN: 5 mg/dL — ABNORMAL LOW (ref 6–23)
CO2: 28 mEq/L (ref 19–32)
Calcium: 8 mg/dL — ABNORMAL LOW (ref 8.4–10.5)
Chloride: 100 mEq/L (ref 96–112)
Creatinine, Ser: 0.89 mg/dL (ref 0.50–1.35)
GFR calc Af Amer: >90 mL/min (ref >90)
GFR calc non Af Amer: 85 mL/min — ABNORMAL LOW (ref >90)
Glucose, Bld: 145 mg/dL — ABNORMAL HIGH (ref 70–99)
Potassium: 3.7 mEq/L (ref 3.5–5.1)
Sodium: 132 mEq/L — ABNORMAL LOW (ref 135–145)

## 2011-02-10 NOTE — Progress Notes (Signed)
Pt's foley was d/c at 0736, pt tolerated procedure well, urinal given to pt.

## 2011-02-10 NOTE — Progress Notes (Signed)
Pt was complaining of hiccough, Dr. Michaell Cowing was notified, orders received.

## 2011-02-10 NOTE — Progress Notes (Signed)
  1 Day Post-Op  1 Day Post-Op  Subjective: Feeling good.  Not much in the way of incisional pain.  Had 2 BMs.    Objective: Vital signs in last 24 hours: Temp:  [96.8 F (36 C)-98.5 F (36.9 C)] 98 F (36.7 C) (11/13 0630) Pulse Rate:  [61-72] 61  (11/13 0630) Resp:  [10-20] 18  (11/13 0630) BP: (109-165)/(61-91) 109/65 mmHg (11/13 0630) SpO2:  [97 %-100 %] 98 % (11/13 0630) FiO2 (%):  [2 %] 2 % (11/13 0630) Weight:  [80.1 kg (176 lb 9.4 oz)] 176 lb 9.4 oz (80.1 kg) (11/12 1324)   Intake/Output from previous day: 11/12 0701 - 11/13 0700 In: 5470 [I.V.:5320] Out: 2351 [Urine:2250; Stool:1; Blood:100] Intake/Output this shift: Total I/O In: -  Out: 150 [Urine:150]  Dressings dry.  Abdomen soft, nontender  Lab Results:   [facility] 02/10/11 0418 02/09/11 1354  WBC 5.6 9.2  HGB 9.2* 10.2*  HCT 30.7* 32.8*  PLT 135* 153   BMET  Basename 02/10/11 0418 02/09/11 1354  NA 132* --  K 3.7 --  CL 100 --  CO2 28 --  GLUCOSE 145* --  BUN 5* --  CREATININE 0.89 0.84  CALCIUM 8.0* --   PT/INR No results found for this basename: LABPROT:2,INR:2 in the last 72 hours  Studies/Results: No results found.  Anti-infectives: Anti-infectives     Start     Dose/Rate Route Frequency Ordered Stop   02/09/11 0615   cefOXitin (MEFOXIN) 1 g in dextrose 5 % 50 mL IVPB        1 g 100 mL/hr over 30 Minutes Intravenous  Once 02/09/11 0607 02/09/11 0748          Assessment/Plan: continue on H2O with meds but want to progress slowly with diet 1 Day Post-Op    LOS: 1 day    [name], MD, [facility] [phone] beeper [phone]  02/10/2011 9:18 AM

## 2011-02-11 ENCOUNTER — Encounter (HOSPITAL_COMMUNITY): Payer: Self-pay | Admitting: Surgery

## 2011-02-12 NOTE — Progress Notes (Signed)
  3 Days Post-Op  3 Days Post-Op  Subjective: Feeling better.  No complaints.  Taking clears OK  Objective: Vital signs in last 24 hours: Temp:  [97.7 F (36.5 C)-99.3 F (37.4 C)] 98.1 F (36.7 C) (11/15 0500) Pulse Rate:  [66-87] 76  (11/15 0500) Resp:  [16-20] 18  (11/15 0500) BP: (137-170)/(72-83) 170/81 mmHg (11/15 0500) SpO2:  [95 %-98 %] 97 % (11/15 0500)   Intake/Output from previous day: 11/14 0701 - 11/15 0700 In: 900 [I.V.:900] Out: 3875 [Urine:3875] Intake/Output this shift:    Incisons OK  Lab Results:   [facility] 02/10/11 0418 02/09/11 1354  WBC 5.6 9.2  HGB 9.2* 10.2*  HCT 30.7* 32.8*  PLT 135* 153   BMET  Basename 02/10/11 0418 02/09/11 1354  NA 132* --  K 3.7 --  CL 100 --  CO2 28 --  GLUCOSE 145* --  BUN 5* --  CREATININE 0.89 0.84  CALCIUM 8.0* --   PT/INR No results found for this basename: LABPROT:2,INR:2 in the last 72 hours  Studies/Results: No results found.  Anti-infectives: Anti-infectives     Start     Dose/Rate Route Frequency Ordered Stop   02/09/11 0615   cefOXitin (MEFOXIN) 1 g in dextrose 5 % 50 mL IVPB        1 g 100 mL/hr over 30 Minutes Intravenous  Once 02/09/11 0607 02/09/11 0748          Assessment/Plan: Advance diet 3 Days Post-Op    LOS: 3 days    [name], MD, [facility] [phone] beeper [phone]  02/12/2011 7:09 AM

## 2011-02-13 LAB — CBC
HCT: 35.4 % — ABNORMAL LOW (ref 39.0–52.0)
Hemoglobin: 10.9 g/dL — ABNORMAL LOW (ref 13.0–17.0)
MCH: 23.8 pg — ABNORMAL LOW (ref 26.0–34.0)
MCHC: 30.8 g/dL (ref 30.0–36.0)
MCV: 77.3 fL — ABNORMAL LOW (ref 78.0–100.0)
Platelets: 230 10*3/uL (ref 150–400)
RBC: 4.58 MIL/uL (ref 4.22–5.81)
RDW: 24 % — ABNORMAL HIGH (ref 11.5–15.5)
WBC: 5.4 10*3/uL (ref 4.0–10.5)

## 2011-02-13 LAB — DIFFERENTIAL
Basophils Absolute: 0.1 10*3/uL (ref 0.0–0.1)
Basophils Relative: 1 % (ref 0–1)
Eosinophils Absolute: 0.4 10*3/uL (ref 0.0–0.7)
Eosinophils Relative: 8 % — ABNORMAL HIGH (ref 0–5)
Lymphocytes Relative: 24 % (ref 12–46)
Lymphs Abs: 1.3 10*3/uL (ref 0.7–4.0)
Monocytes Absolute: 0.6 10*3/uL (ref 0.1–1.0)
Monocytes Relative: 11 % (ref 3–12)
Neutro Abs: 3 10*3/uL (ref 1.7–7.7)
Neutrophils Relative %: 56 % (ref 43–77)

## 2011-02-13 MED ORDER — ALVIMOPAN 12 MG PO CAPS
12.0000 mg | ORAL_CAPSULE | Freq: Two times a day (BID) | ORAL | Status: DC
  Administered 2011-02-13 – 2011-02-14 (×2): 12 mg via ORAL
  Filled 2011-02-13 (×3): qty 1

## 2011-02-13 MED ORDER — OXYCODONE-ACETAMINOPHEN 5-325 MG PO TABS: 1.0000 | ORAL_TABLET | ORAL | Status: DC | PRN

## 2011-02-13 NOTE — Progress Notes (Signed)
4 Days Post-Op  4 Days Post-Op  Subjective: Feeling better and taking a full liquid diet. We'll advance to low-residue diet.  Objective: Vital signs in last 24 hours: Temp:  [97.8 F (36.6 C)-98.4 F (36.9 C)] 97.8 F (36.6 C) (11/16 0600) Pulse Rate:  [67-85] 67  (11/16 0600) Resp:  [18-20] 20  (11/16 0752) BP: (138-169)/(67-88) 143/76 mmHg (11/16 0600) SpO2:  [96 %-99 %] 98 % (11/16 0752)   Intake/Output from previous day: 11/15 0701 - 11/16 0700 In: 3542.3 [P.O.:1080; I.V.:2462.3] Out: 3550 [Urine:3550] Intake/Output this shift:    No complaints of abdominal pain.  Lab Results:  No results found for this basename: WBC:2,HGB:2,HCT:2,PLT:2 in the last 72 hours BMET No results found for this basename: NA:2,K:2,CL:2,CO2:2,GLUCOSE:2,BUN:2,CREATININE:2,CALCIUM:2 in the last 72 hours PT/INR No results found for this basename: LABPROT:2,INR:2 in the last 72 hours  Studies/Results: No results found.  Anti-infectives: Anti-infectives     Start     Dose/Rate Route Frequency Ordered Stop   02/09/11 0615   cefOXitin (MEFOXIN) 1 g in dextrose 5 % 50 mL IVPB        1 g 100 mL/hr over 30 Minutes Intravenous  Once 02/09/11 1610 02/09/11 0748          Assessment/Plan: Plan for discharge tomorrow 4 Days Post-Op    LOS: 4 days    [name], MD, [facility] [phone] beeper [phone]  02/13/2011 10:58 AM

## 2011-02-14 NOTE — Discharge Summary (Signed)
  Patient ID: Mark Clayton [id number] 70 y.o. 02/07/1941  02/09/2011  Discharge date and time: 02/14/2011  1:13 PM  Admitting Physician: Ernestene Mention  Discharge Physician: Ernestene Mention  Admission Diagnoses: sigmoid colon cancer  Discharge Diagnoses: adenocarcinoma of the sigmoid colon, pathologic stage T3,N2a. Coronary artery disease, status post CABG, status post myocardial infarction. Status post cardiac defibrillator placement Hypertension Aortic stenosis, mild Systolic congestive heart failure, chronic Status post right inguinal hernia repair  Operations: Procedure(s): LAPAROSCOPIC SIGMOID COLON RESECTION  Admission Condition: good  Discharged Condition: good  Indication for Admission: this patient was found to have a adenocarcinoma of the sigmoid colon on recent colonoscopy. He was evaluated by Dr. [name] as an outpatient. He underwent bowel prep at home and was brought to the hospital electively.  Hospital Course: on the day of admission the patient underwent a laparoscopic-assisted sigmoid colectomy. Postoperatively the patient progressed in his diet and activities and was ready for discharge on postop day #4.   At that time he was tolerating a regular diet, was having bowel movements, had no wound healing problems and was very comfortable with minimal pain. He was advised of his pathology report. He was scheduled to see Dr. Mancel Bale as an outpatient for medical oncology consultation. He was scheduled followup with Dr. Daphine Deutscher in the office in about one week to get his staples out.  Consults: none  Significant Diagnostic Studies:   Treatments: surgery: laparoscopic assisted sigmoid colectomy  Disposition: Home  Patient Instructions:   [place], [place]  Home Medication Instructions WUJ:[id number]   Printed on:02/14/11 1450  Medication Information                    simvastatin (ZOCOR) 80 MG tablet Take 0.5 tablets (40 mg total) by mouth at  bedtime.           carvedilol (COREG) 6.25 MG tablet Take 6.25 mg by mouth every morning.            carvedilol (COREG) 25 MG tablet Take 12.5 mg by mouth at bedtime. Take in the pm            aspirin 81 MG tablet Take 81 mg by mouth at bedtime.            nitroGLYCERIN (NITROSTAT) 0.4 MG SL tablet Place 0.4 mg under the tongue every 5 (five) minutes as needed. Chest pain              omeprazole (PRILOSEC) 20 MG capsule Take 1 capsule (20 mg total) by mouth daily.           FeFum-FePoly-FA-B Cmp-C-Biot (INTEGRA PLUS) CAPS Take 1 capsule by mouth once daily           naproxen sodium (ANAPROX) 220 MG tablet Take 220 mg by mouth 2 (two) times daily as needed. Pain            furosemide (LASIX) 20 MG tablet Take 20 mg by mouth every morning.              Activity:  Diet: cardiac diet Wound Care: keep wound clean and dry  Follow-up:  With Dr. Daphine Deutscher in 8-10 days. in 10 days.  Signed: Angelia Mould. Derrell Lolling, M.D., FACS General and minimally invasive surgery Breast and Colorectal Surgery  02/14/2011, 2:50 PM

## 2011-02-14 NOTE — Progress Notes (Signed)
5 Days Post-Op  Subjective: He is feeling well and wants to go home. Tolerating regular diet and having bowel movements. He has no pain and does not want any prescription for analgesics. He understands his pathology report and has an appointment to see Dr. Mancel Bale on December 3. He still has staples in the wound and I told him that he would need to see Dr. Daphine Deutscher the week after Thanksgiving to have these removed. I went over his home care and diet instructions in detail.  Objective: Vital signs in last 24 hours: Temp:  [97.9 F (36.6 C)-98.3 F (36.8 C)] 97.9 F (36.6 C) (11/17 0937) Pulse Rate:  [67-69] 69  (11/17 0937) Resp:  [18] 18  (11/17 0937) BP: (132-155)/(67-76) 136/67 mmHg (11/17 0937) SpO2:  [97 %-100 %] 98 % (11/17 0937) Last BM Date: 02/13/11  Intake/Output from previous day: 11/16 0701 - 11/17 0700 In: 2957.5 [P.O.:1440; I.V.:1517.5] Out: 3575 [Urine:3575] Intake/Output this shift: Total I/O In: 360 [P.O.:360] Out: -   GI: abdomen is soft and nontender. Not distended. All the wounds are healing without any signs of infections. Staples are in place.  Lab Results:  Results for orders placed during the hospital encounter of 02/09/11 (from the past 24 hour(s))  CBC     Status: Abnormal   Collection Time   02/13/11 11:35 AM      Component Value Range   WBC 5.4  4.0 - 10.5 (K/uL)   RBC 4.58  4.22 - 5.81 (MIL/uL)   Hemoglobin 10.9 (*) 13.0 - 17.0 (g/dL)   HCT 04.5 (*) 40.9 - 52.0 (%)   MCV 77.3 (*) 78.0 - 100.0 (fL)   MCH 23.8 (*) 26.0 - 34.0 (pg)   MCHC 30.8  30.0 - 36.0 (g/dL)   RDW 81.1 (*) 91.4 - 15.5 (%)   Platelets 230  150 - 400 (K/uL)  DIFFERENTIAL     Status: Abnormal   Collection Time   02/13/11 11:35 AM      Component Value Range   Neutrophils Relative 56  43 - 77 (%)   Lymphocytes Relative 24  12 - 46 (%)   Monocytes Relative 11  3 - 12 (%)   Eosinophils Relative 8 (*) 0 - 5 (%)   Basophils Relative 1  0 - 1 (%)   Neutro Abs 3.0  1.7 - 7.7  (K/uL)   Lymphs Abs 1.3  0.7 - 4.0 (K/uL)   Monocytes Absolute 0.6  0.1 - 1.0 (K/uL)   Eosinophils Absolute 0.4  0.0 - 0.7 (K/uL)   Basophils Absolute 0.1  0.0 - 0.1 (K/uL)   RBC Morphology ELLIPTOCYTES       Studies/Results: @RISRSLT24 @     . alvimopan  12 mg Oral Q12H  . aspirin  81 mg Oral QHS  . carvedilol  12.5 mg Oral QHS  . carvedilol  6.25 mg Oral QAM  . heparin  5,000 Units Subcutaneous Q8H  . DISCONTD: HYDROmorphone PCA 0.3 mg/mL   Intravenous Q4H     Assessment/Plan: s/p Procedure(s): LAPAROSCOPIC SIGMOID COLON RESECTION Discharge  High fiber, low fat diet.  Call for appointment with Dr. Daphine Deutscher the week after Thanksgiving. Approximately 8-10 days from now.  See Dr. Mancel Bale December 3.     LOS: 5 days    Mark Clayton M 02/14/2011  . .prob

## 2011-02-16 ENCOUNTER — Telehealth (INDEPENDENT_AMBULATORY_CARE_PROVIDER_SITE_OTHER): Payer: Self-pay | Admitting: Surgery

## 2011-02-16 NOTE — Telephone Encounter (Signed)
Pt had sx on 02/09/11, needs a 2wk po, please call.

## 2011-02-17 NOTE — Op Note (Signed)
:    Valarie Merino, MD  Service:  (none)  Author Type:  Physician   Filed:  02/09/11 1034  Note Time:  02/09/11 1025          Stager laparoscopically assisted sigmoid colectomy  Surgeon Daphine Deutscher  Asst. [facility]  Description of procedure: Patient was taken to room 11 on Monday, November 12. Informed consent had been obtained and after general anesthesia was administered he was prepped with PCMX and draped sterilely. He was in the dorsal lithotomy position. A timeout was performed  The abdomen was entered using a 5 mm 0 Optiview technique the right upper quadrant establishing a pneumoperitoneum and placing 2 other 5 mm trochars one in the lower midline and one in the right lower quadrant. Through these identify the tattooed area down in the pelvis and found fairly redundant sigmoid colon. Using harmonic scalpel to incise the peritoneal reflection and mobilized the descending colon in the sigmoid. The right side of the tumor mass was stuck to some of the mesentery and I elected to make my lower midline incision early to do that with palpation guidance. A Mentor none centimeter incision down toward the pubis from the 5 mm trocar site, placed the wound protector, and mobilize the tumor. To do this I had to take down the aforementioned adhesion with harmonic scalpel and once I did this I was able to get the tumor up into the out of the wound easily. I chose a site distally for the distal margin. Prior to doing a resection identify the left ureter and it was well out of harm's way. The distal bowel was divided with the contour stapler using a green load. The mesentery was divided with the LigaSure. Proximally it was divided with a contour blue load.  I performed a EEA side to end Anastomosis Stevenson Clinch) using a [address]. The anvil was introduced through an opening in the distal end of the staple line. This was then stapled with another application of the contour to seal the area off.  It was then brought down into the pelvis by Dr. Johna Sheriff While I went below and introduced the stapler. We coupled the device is, closed them, held compression for 20 seconds and then fired. The resultant anastomotic rings were both intact and insufflation of the anastomosis under pressure using a rigid sigmoidoscope which had good demonstrated no air bubbles or evidence of leak.  Gloves and garments were changed. Sponge and needle counts were reported as correct. The peritoneum of the lower midline incision was closed with a running 2-0 Vicryl. Expariel was infiltrated into the fascia which was closed with interrupted #1 Novafils. Wounds were irrigated closed with staples. Patient tolerated procedure well and was taken to the recovery room in satisfactory condition. He will be admitted.

## 2011-02-23 ENCOUNTER — Ambulatory Visit (INDEPENDENT_AMBULATORY_CARE_PROVIDER_SITE_OTHER): Payer: Medicare Other

## 2011-02-23 DIAGNOSIS — Z4802 Encounter for removal of sutures: Secondary | ICD-10-CM

## 2011-02-23 NOTE — Progress Notes (Signed)
Pt here for suture removal area looks a little red, no heat to touch, pt afebrile, staples removed midline of stomach. Pt doing well.

## 2011-02-26 ENCOUNTER — Ambulatory Visit: Payer: Medicare Other | Admitting: Oncology

## 2011-02-26 ENCOUNTER — Ambulatory Visit: Payer: Medicare Other

## 2011-02-26 ENCOUNTER — Other Ambulatory Visit: Payer: Medicare Other

## 2011-03-02 ENCOUNTER — Other Ambulatory Visit: Payer: Medicare Other

## 2011-03-02 ENCOUNTER — Telehealth: Payer: Self-pay | Admitting: Oncology

## 2011-03-02 ENCOUNTER — Ambulatory Visit: Payer: Medicare Other | Admitting: Oncology

## 2011-03-02 ENCOUNTER — Ambulatory Visit: Payer: Medicare Other

## 2011-03-02 NOTE — Telephone Encounter (Signed)
S/w the pt's wife regarding the pt's new apt on 03/16/2011@1 :30pm. Per pt's wife she will let the pt know and call me back if that d/t does not work

## 2011-03-06 ENCOUNTER — Ambulatory Visit (INDEPENDENT_AMBULATORY_CARE_PROVIDER_SITE_OTHER): Payer: Medicare Other | Admitting: Surgery

## 2011-03-06 ENCOUNTER — Encounter (INDEPENDENT_AMBULATORY_CARE_PROVIDER_SITE_OTHER): Payer: Self-pay | Admitting: Surgery

## 2011-03-06 ENCOUNTER — Other Ambulatory Visit (INDEPENDENT_AMBULATORY_CARE_PROVIDER_SITE_OTHER): Payer: Self-pay | Admitting: General Surgery

## 2011-03-06 DIAGNOSIS — C189 Malignant neoplasm of colon, unspecified: Secondary | ICD-10-CM

## 2011-03-06 NOTE — Progress Notes (Signed)
Mark Clayton and his wife came in today. I discussed his path report and his positive lymph nodes. He has put off seeing Dr. Truett Perna until he had this appt with me.  I encouraged him to see Dr. Truett Perna and in the meantime I have ordered a PET scan which I think Dr. Truett Perna will need in making his assessment.  His incision is healed nicely. His exercise tolerance is up. He is feeling much better.  Plan PET scan, see Dr. Truett Perna, follow up with me in the office

## 2011-03-16 ENCOUNTER — Ambulatory Visit: Payer: Medicare Other

## 2011-03-16 ENCOUNTER — Telehealth: Payer: Self-pay | Admitting: *Deleted

## 2011-03-16 ENCOUNTER — Other Ambulatory Visit: Payer: Medicare Other

## 2011-03-16 ENCOUNTER — Other Ambulatory Visit (HOSPITAL_COMMUNITY): Payer: Medicare Other

## 2011-03-16 ENCOUNTER — Ambulatory Visit: Payer: Medicare Other | Admitting: Oncology

## 2011-03-16 NOTE — Telephone Encounter (Signed)
Multiple attempts made to patient by scheduling staff to establish new patient appt for follow up on colon cancer. Called and spoke to the patient, he states that he does not wish to do anything for 6months. Asked patient if he was aware of the referral from [facility], he states that he was and that he told Dr Norva Riffle office this as well. He says he has a bad heart and is "not ready to have it torn up with any treatment".  Patient informed we will notify referring office of decline to establish and continue to follow his PCP for any problems. Patient thanked me for my call and concern.

## 2011-04-09 ENCOUNTER — Encounter (INDEPENDENT_AMBULATORY_CARE_PROVIDER_SITE_OTHER): Payer: Medicare Other | Admitting: Surgery

## 2011-04-20 ENCOUNTER — Encounter: Payer: Self-pay | Admitting: Internal Medicine

## 2011-06-08 ENCOUNTER — Ambulatory Visit (INDEPENDENT_AMBULATORY_CARE_PROVIDER_SITE_OTHER): Payer: Medicare Other | Admitting: *Deleted

## 2011-06-08 ENCOUNTER — Encounter: Payer: Self-pay | Admitting: Internal Medicine

## 2011-06-08 DIAGNOSIS — Z95810 Presence of automatic (implantable) cardiac defibrillator: Secondary | ICD-10-CM

## 2011-06-08 DIAGNOSIS — I2589 Other forms of chronic ischemic heart disease: Secondary | ICD-10-CM

## 2011-06-08 DIAGNOSIS — I509 Heart failure, unspecified: Secondary | ICD-10-CM

## 2011-06-08 LAB — ICD DEVICE OBSERVATION
BATTERY VOLTAGE: 3.1526 V
BRDY-0002RV: 40 {beats}/min
CHARGE TIME: 9.359 s
DEV-0020ICD: NEGATIVE
FVT: 0
PACEART VT: 0
RV LEAD AMPLITUDE: 31.625 mv
RV LEAD IMPEDENCE ICD: 532 Ohm
RV LEAD THRESHOLD: 1 V
TOT-0001: 1
TOT-0002: 0
TOT-0006: 20100913000000
TZAT-0001FASTVT: 1
TZAT-0001SLOWVT: 1
TZAT-0002FASTVT: NEGATIVE
TZAT-0002SLOWVT: NEGATIVE
TZAT-0012FASTVT: 200 ms
TZAT-0012SLOWVT: 200 ms
TZAT-0018FASTVT: NEGATIVE
TZAT-0018SLOWVT: NEGATIVE
TZAT-0019FASTVT: 8 V
TZAT-0019SLOWVT: 8 V
TZAT-0020FASTVT: 1.5 ms
TZAT-0020SLOWVT: 1.5 ms
TZON-0003SLOWVT: 370 ms
TZON-0003VSLOWVT: 370 ms
TZON-0004SLOWVT: 16
TZON-0004VSLOWVT: 32
TZON-0005SLOWVT: 12
TZST-0001FASTVT: 2
TZST-0001FASTVT: 3
TZST-0001FASTVT: 4
TZST-0001FASTVT: 5
TZST-0001FASTVT: 6
TZST-0001SLOWVT: 2
TZST-0001SLOWVT: 3
TZST-0001SLOWVT: 4
TZST-0001SLOWVT: 5
TZST-0001SLOWVT: 6
TZST-0002FASTVT: NEGATIVE
TZST-0002FASTVT: NEGATIVE
TZST-0002FASTVT: NEGATIVE
TZST-0002FASTVT: NEGATIVE
TZST-0002FASTVT: NEGATIVE
TZST-0002SLOWVT: NEGATIVE
TZST-0002SLOWVT: NEGATIVE
TZST-0002SLOWVT: NEGATIVE
TZST-0002SLOWVT: NEGATIVE
TZST-0002SLOWVT: NEGATIVE
VENTRICULAR PACING ICD: 0.08 pct
VF: 0

## 2011-06-08 NOTE — Progress Notes (Signed)
ICD check 

## 2011-07-28 ENCOUNTER — Encounter: Payer: Self-pay | Admitting: Internal Medicine

## 2011-07-31 ENCOUNTER — Telehealth: Payer: Self-pay | Admitting: Internal Medicine

## 2011-07-31 NOTE — Telephone Encounter (Signed)
Pt called to question his recall colon. He had a COLON 01/19/11 revealing a Sigmoid mass that Dr Daphine Deutscher resected. He refused to be seen at the [facility] per notes and the NP coordinator. Informed pt the recall is f/u post op and post treatment. Pt reports he hasn't had any problems and reports good functioning bowels. He had labs at Dr Quita Skye ofc in January and was told he was almost back to normal. He did stop the Iron d/t constipation. Please advise; continue with recall? Thanks.

## 2011-08-03 NOTE — Telephone Encounter (Signed)
Informed pt of Dr Lauro Franklin recommendations. He still had questions about the COLON. We discussed the matter and felt it best that he come in and discuss the situation with Dr Rhea Belton; pt will come in 08/10/11.

## 2011-08-03 NOTE — Telephone Encounter (Signed)
He did have sigmoid colon cancer s/p resection. He never had a full colonoscopy due to poor preparation and the presence of the sigmoid cancer. He needs to have colonoscopy now to clear remaining colon. Thanks.

## 2011-08-06 ENCOUNTER — Encounter: Payer: Self-pay | Admitting: Internal Medicine

## 2011-08-10 ENCOUNTER — Ambulatory Visit (INDEPENDENT_AMBULATORY_CARE_PROVIDER_SITE_OTHER): Payer: Medicare Other | Admitting: Internal Medicine

## 2011-08-10 ENCOUNTER — Encounter: Payer: Self-pay | Admitting: Internal Medicine

## 2011-08-10 VITALS — BP 144/76 | HR 64 | Ht 72.0 in | Wt 181.4 lb

## 2011-08-10 DIAGNOSIS — Z1211 Encounter for screening for malignant neoplasm of colon: Secondary | ICD-10-CM

## 2011-08-10 DIAGNOSIS — Z85038 Personal history of other malignant neoplasm of large intestine: Secondary | ICD-10-CM

## 2011-08-10 MED ORDER — METOCLOPRAMIDE HCL 5 MG PO TABS: 5.0000 mg | ORAL_TABLET | Freq: Four times a day (QID) | ORAL | Status: DC

## 2011-08-10 NOTE — Patient Instructions (Signed)
You have been scheduled for a colonoscopy with propofol. Please follow written instructions given to you at your visit today.  Please pick up your prep kit at the pharmacy within the next 1-3 days.  

## 2011-08-10 NOTE — Progress Notes (Signed)
Subjective:    Patient ID: Mark Clayton, male    DOB: 11/15/1940, 71 y.o.   MRN: [id number]  HPI [name] is a 71 year old male with a past medical history of sigmoid colon cancer diagnosed in October 2012 status post sigmoidectomy, CAD, GERD, ischemic cardiomyopathy, and hypertension who is seen in followup. I performed [name] colonoscopy on 01/19/2011 and diagnosed a sigmoid colon cancer. Due to a poor prep the test was incomplete. He was seen by Dr. Wenda Low and underwent sigmoid colectomy on 02/12/2011. The surgical pathology confirmed invasive adenocarcinoma which was positive in 6 of 23 resected lymph nodes. The patient did have an appointment with Dr. Truett Perna with medical oncology, but decided not to seek oncologic opinion/therapy. He reports being scared of possible chemotherapy and radiation because he seen friends get chemotherapy and do poorly. He feels that his surgery likely cured his disease. He has done quite well, has gained a significant amount weight back and has been able to be quite active. He reports his energy level and stamina are the best they've been in years. He denies abdominal pain. No nausea or vomiting. He does have some issues with constipation despite sigmoid resection. He is using MiraLAX every night, though slightly less than 17 g. He is having a bowel movement every one to 2 days. No rectal bleeding. No melena.  Review of Systems As per history of present illness, otherwise negative  Past Medical History  Diagnosis Date  . Ischemic cardiomyopathy     proir bypass. EF 25%. (Master study EF >20%)  . Carotid bruit   . Hyperlipidemia   . CAD (coronary artery disease)     severe 3 vessel CAD w/unstable angina and acute MI  . HTN (hypertension)     systolic  . Myocardial infarct 02-06-11    '03-MI  . GERD (gastroesophageal reflux disease) 02-06-11    reflux is controlled-Omeprazole  . Colon cancer 02-06-11     Sigmoid  . Anemia 02-06-11    takes oral  iron   Current Outpatient Prescriptions  Medication Sig Dispense Refill  . aspirin 81 MG tablet Take 81 mg by mouth at bedtime.       . carvedilol (COREG) 25 MG tablet Take 12.5 mg by mouth at bedtime. Take in the pm       . carvedilol (COREG) 6.25 MG tablet Take 6.25 mg by mouth every morning.       . furosemide (LASIX) 20 MG tablet Take 20 mg by mouth every morning.       . nitroGLYCERIN (NITROSTAT) 0.4 MG SL tablet Place 0.4 mg under the tongue every 5 (five) minutes as needed. Chest pain         . omeprazole (PRILOSEC) 20 MG capsule Take 1 capsule (20 mg total) by mouth daily.  90 capsule  3  . simvastatin (ZOCOR) 80 MG tablet Take 0.5 tablets (40 mg total) by mouth at bedtime.  90 tablet  3  . metoCLOPramide (REGLAN) 5 MG tablet Take 1 tablet (5 mg total) by mouth 4 (four) times daily.  2 tablet  0   Allergies  Allergen Reactions  . Latex Rash   Family History  Problem Relation Age of Onset  . Hypertension Father   . Hypertension Sister   . Hyperlipidemia Father   . Hyperlipidemia Sister   . Heart disease Father   . Prostate cancer Father   . Alzheimer's disease Mother    History  Substance Use Topics  .  Smoking status: Never Smoker   . Smokeless tobacco: Never Used   Comment: tobacco use - no  . 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 He has a shop out behind his home      Objective:   Physical Exam BP 144/76  Pulse 64  Ht 6' (1.829 m)  Wt 181 lb 6 oz (82.271 kg)  BMI 24.60 kg/m2 Constitutional: Well-developed and well-nourished. No distress. HEENT: Normocephalic and atraumatic. Oropharynx is clear and moist. No oropharyngeal exudate. Conjunctivae are normal. Pupils are equal round and reactive to light. No scleral icterus. Neck: Neck supple. Trachea midline. Cardiovascular: Normal rate, regular rhythm and intact distal pulses. No M/R/G Pulmonary/chest: Effort normal and breath sounds normal.  No wheezing, rales or rhonchi. Abdominal: Soft, nontender, nondistended. Well-healed vertical incision below the umbilicus Bowel sounds active throughout. There are no masses palpable. No hepatosplenomegaly. Extremities: no clubbing, cyanosis, or edema Lymphadenopathy: No cervical adenopathy noted. Neurological: Alert and oriented to person place and time. Skin: Skin is warm and dry. No rashes noted. Psychiatric: Normal mood and affect. Behavior is normal.  Labs; --he reports this is being followed by his primary care doctor in [place]. He recalls his last hemoglobin being greater than 12.    Assessment & Plan:  71 year old male with a past medical history of sigmoid colon cancer diagnosed in October 2012 status post sigmoidectomy, CAD, GERD, ischemic cardiomyopathy, and hypertension who is seen in followup  1. Sigmoid colon cancer -- the patient's status post resection, unfortunately he did have 6 of 23 lymph nodes positive for tumor. He never sought oncology evaluation, and has no desire to do so now. This makes me somewhat concerned about his overall chance for complete she work, but he certainly seems to be doing well now. He has not had a complete colonoscopy to clear the right colon, and he is due for this now. He understands the recommendation colonoscopy and wishes to proceed. This will be scheduled for him in the upcoming weeks and he is requested MiraLAX prep which is okay with me. Ideally he would at least have an oncology consultation, but he does not want this at present.  Further recommendations after colonoscopy

## 2011-08-17 ENCOUNTER — Telehealth: Payer: Self-pay | Admitting: Internal Medicine

## 2011-08-17 NOTE — Telephone Encounter (Signed)
Per Dr Rhea Belton the patient needs to be rescheduled to [facility] due to CRNA from [facility]  The patient has AICD and they do not feel he can have the procedure safely done upstairs.  I have explained this to the patient and he verbalizes understanding.  He has already mixed his prep.  I have provided him samples of Miralax.  He is rescheduled to [facility] for 08/31/11 8:30 scheduled with [facility] booking # [phone].  The patient is advised of the new date and time, he is aware to be NPO after midnight to arrive at [facility] @ 7:30 on 08/31/11 and register in radiology.

## 2011-08-18 ENCOUNTER — Encounter: Payer: Medicare Other | Admitting: Internal Medicine

## 2011-08-27 ENCOUNTER — Telehealth: Payer: Self-pay | Admitting: Internal Medicine

## 2011-08-27 NOTE — Telephone Encounter (Signed)
i can not justify an inpt hospital stay for prep indication. Sorry. ----- Message ----- From: Linna Hoff, RN Sent: 08/27/2011 10:26 AM To: Beverley Fiedler, MD  Pt states he can get his colon done cheaper if he stays overnight in the hospital. He was apoor prep, we could do it to ensure a good prep! Your thoughts?       Informed wife Okey Regal that Dr Rhea Belton cannot justify admitting pt for his repeat Colonoscopy.  Wife stated understanding and knew we couldn't, but her husband persisted. She states he thinks we are going to stop his AICD during the procedure. Assured his wife the doc nor the CRNA would stop the defibrillator without a Cardiologist present. We only moved his procedure to [facility] d/t recommendation from the CRNA here after reading his chart. If pt would be more comfortable, he can call me tomorrow to discuss or I could contact Dr Graciela Husbands, his cardiologist. Okey Regal thanked me and will inform the pt.

## 2011-08-28 NOTE — Telephone Encounter (Signed)
See other note CMA opened.

## 2011-08-31 ENCOUNTER — Encounter (HOSPITAL_COMMUNITY)
Admission: RE | Disposition: A | Discharge status: Home / Self Care | Payer: Self-pay | Source: Ambulatory Visit | Attending: Internal Medicine

## 2011-08-31 ENCOUNTER — Encounter (HOSPITAL_COMMUNITY): Payer: Self-pay

## 2011-08-31 ENCOUNTER — Ambulatory Visit (HOSPITAL_COMMUNITY)
Admission: RE | Admit: 2011-08-31 | Discharge: 2011-08-31 | Disposition: A | Discharge status: Home / Self Care | Payer: Medicare Other | Source: Ambulatory Visit | Attending: Internal Medicine | Admitting: Internal Medicine

## 2011-08-31 DIAGNOSIS — Z7982 Long term (current) use of aspirin: Secondary | ICD-10-CM | POA: Insufficient documentation

## 2011-08-31 DIAGNOSIS — I252 Old myocardial infarction: Secondary | ICD-10-CM | POA: Insufficient documentation

## 2011-08-31 DIAGNOSIS — Z9049 Acquired absence of other specified parts of digestive tract: Secondary | ICD-10-CM | POA: Insufficient documentation

## 2011-08-31 DIAGNOSIS — K219 Gastro-esophageal reflux disease without esophagitis: Secondary | ICD-10-CM | POA: Insufficient documentation

## 2011-08-31 DIAGNOSIS — Z85038 Personal history of other malignant neoplasm of large intestine: Secondary | ICD-10-CM | POA: Insufficient documentation

## 2011-08-31 DIAGNOSIS — E785 Hyperlipidemia, unspecified: Secondary | ICD-10-CM | POA: Insufficient documentation

## 2011-08-31 DIAGNOSIS — C187 Malignant neoplasm of sigmoid colon: Secondary | ICD-10-CM

## 2011-08-31 DIAGNOSIS — I10 Essential (primary) hypertension: Secondary | ICD-10-CM | POA: Insufficient documentation

## 2011-08-31 DIAGNOSIS — K6389 Other specified diseases of intestine: Secondary | ICD-10-CM | POA: Insufficient documentation

## 2011-08-31 DIAGNOSIS — K635 Polyp of colon: Secondary | ICD-10-CM

## 2011-08-31 DIAGNOSIS — D126 Benign neoplasm of colon, unspecified: Secondary | ICD-10-CM | POA: Insufficient documentation

## 2011-08-31 DIAGNOSIS — Z79899 Other long term (current) drug therapy: Secondary | ICD-10-CM | POA: Insufficient documentation

## 2011-08-31 DIAGNOSIS — K5730 Diverticulosis of large intestine without perforation or abscess without bleeding: Secondary | ICD-10-CM | POA: Insufficient documentation

## 2011-08-31 HISTORY — PX: COLONOSCOPY: SHX5424

## 2011-08-31 SURGERY — COLONOSCOPY
Anesthesia: Moderate Sedation

## 2011-08-31 MED ORDER — FENTANYL CITRATE 0.05 MG/ML IJ SOLN
INTRAMUSCULAR | Status: DC | PRN
  Administered 2011-08-31 (×2): 25 ug via INTRAVENOUS

## 2011-08-31 MED ORDER — MIDAZOLAM HCL 5 MG/5ML IJ SOLN
INTRAMUSCULAR | Status: DC | PRN
  Administered 2011-08-31 (×2): 2 mg via INTRAVENOUS

## 2011-08-31 MED ORDER — FENTANYL CITRATE 0.05 MG/ML IJ SOLN
INTRAMUSCULAR | Status: AC
  Filled 2011-08-31: qty 2

## 2011-08-31 MED ORDER — MIDAZOLAM HCL 10 MG/2ML IJ SOLN
INTRAMUSCULAR | Status: AC
  Filled 2011-08-31: qty 2

## 2011-08-31 MED ORDER — SODIUM CHLORIDE 0.9 % IV SOLN
Freq: Once | INTRAVENOUS | Status: AC
  Administered 2011-08-31: 500 mL via INTRAVENOUS

## 2011-08-31 NOTE — Discharge Instructions (Signed)
Hold aspirin for the next 2 weeks to allow time for the polypectomy (places where polyps were removed) to heal.  Endoscopy   Care After Please read the instructions outlined below and refer to this sheet in the next few weeks. These discharge instructions provide you with general information on caring for yourself after you leave the hospital. Your doctor may also give you specific instructions. While your treatment has been planned according to the most current medical practices available, unavoidable complications occasionally occur. If you have any problems or questions after discharge, please call your doctor. HOME CARE INSTRUCTIONS Activity  You may resume your regular activity but move at a slower pace for the next 24 hours.   Take frequent rest periods for the next 24 hours.   Walking will help expel (get rid of) the air and reduce the bloated feeling in your abdomen.   No driving for 24 hours (because of the anesthesia (medicine) used during the test).   You may shower.   Do not sign any important legal documents or operate any machinery for 24 hours (because of the anesthesia used during the test).  Nutrition  Drink plenty of fluids.   You may resume your normal diet.   Begin with a light meal and progress to your normal diet.   Avoid alcoholic beverages for 24 hours or as instructed by your caregiver.  Medications You may resume your normal medications unless your caregiver tells you otherwise. What you can expect today  You may experience abdominal discomfort such as a feeling of fullness or "gas" pains.   You may experience a sore throat for 2 to 3 days. This is normal. Gargling with salt water may help this.  Follow-up Your doctor will discuss the results of your test with you. SEEK IMMEDIATE MEDICAL CARE IF:  You have excessive nausea (feeling sick to your stomach) and/or vomiting.   You have severe abdominal pain and distention (swelling).   You have  trouble swallowing.   You have a temperature over 100 F (37.8 C).   You have rectal bleeding or vomiting of blood.  Document Released: 10/29/2003 Document Revised: 03/05/2011 Document Reviewed: 05/11/2007 [facility] Patient Information 2012 [place], [place].

## 2011-08-31 NOTE — H&P (View-Only) (Signed)
Subjective:    Patient ID: Mark Clayton, male    DOB: 05/04/1940, 71 y.o.   MRN: [id number]  HPI [name] is a 71-year-old male with a past medical history of sigmoid colon cancer diagnosed in October 2012 status post sigmoidectomy, CAD, GERD, ischemic cardiomyopathy, and hypertension who is seen in followup. I performed [name]'s colonoscopy on 01/19/2011 and diagnosed a sigmoid colon cancer. Due to a poor prep the test was incomplete. He was seen by Dr. Matt Martin and underwent sigmoid colectomy on 02/12/2011. The surgical pathology confirmed invasive adenocarcinoma which was positive in 6 of 23 resected lymph nodes. The patient did have an appointment with Dr. Sherrill with medical oncology, but decided not to seek oncologic opinion/therapy. He reports being scared of possible chemotherapy and radiation because he seen friends get chemotherapy and do poorly. He feels that his surgery likely cured his disease. He has done quite well, has gained a significant amount weight back and has been able to be quite active. He reports his energy level and stamina are the best they've been in years. He denies abdominal pain. No nausea or vomiting. He does have some issues with constipation despite sigmoid resection. He is using MiraLAX every night, though slightly less than 17 g. He is having a bowel movement every one to 2 days. No rectal bleeding. No melena.  Review of Systems As per history of present illness, otherwise negative  Past Medical History  Diagnosis Date  . Ischemic cardiomyopathy     proir bypass. EF 25%. (Master study EF >20%)  . Carotid bruit   . Hyperlipidemia   . CAD (coronary artery disease)     severe 3 vessel CAD w/unstable angina and acute MI  . HTN (hypertension)     systolic  . Myocardial infarct 02-06-11    '03-MI  . GERD (gastroesophageal reflux disease) 02-06-11    reflux is controlled-Omeprazole  . Colon cancer 02-06-11     Sigmoid  . Anemia 02-06-11    takes oral  iron   Current Outpatient Prescriptions  Medication Sig Dispense Refill  . aspirin 81 MG tablet Take 81 mg by mouth at bedtime.       . carvedilol (COREG) 25 MG tablet Take 12.5 mg by mouth at bedtime. Take in the pm       . carvedilol (COREG) 6.25 MG tablet Take 6.25 mg by mouth every morning.       . furosemide (LASIX) 20 MG tablet Take 20 mg by mouth every morning.       . nitroGLYCERIN (NITROSTAT) 0.4 MG SL tablet Place 0.4 mg under the tongue every 5 (five) minutes as needed. Chest pain         . omeprazole (PRILOSEC) 20 MG capsule Take 1 capsule (20 mg total) by mouth daily.  90 capsule  3  . simvastatin (ZOCOR) 80 MG tablet Take 0.5 tablets (40 mg total) by mouth at bedtime.  90 tablet  3  . metoCLOPramide (REGLAN) 5 MG tablet Take 1 tablet (5 mg total) by mouth 4 (four) times daily.  2 tablet  0   Allergies  Allergen Reactions  . Latex Rash   Family History  Problem Relation Age of Onset  . Hypertension Father   . Hypertension Sister   . Hyperlipidemia Father   . Hyperlipidemia Sister   . Heart disease Father   . Prostate cancer Father   . Alzheimer's disease Mother    History  Substance Use Topics  .   Smoking status: Never Smoker   . Smokeless tobacco: Never Used   Comment: tobacco use - no  . Alcohol Use: 4.2 oz/week    7 Cans of beer per week     occasional  --he continues to work as a barber. He has a shop out behind his home      Objective:   Physical Exam BP 144/76  Pulse 64  Ht 6' (1.829 m)  Wt 181 lb 6 oz (82.271 kg)  BMI 24.60 kg/m2 Constitutional: Well-developed and well-nourished. No distress. HEENT: Normocephalic and atraumatic. Oropharynx is clear and moist. No oropharyngeal exudate. Conjunctivae are normal. Pupils are equal round and reactive to light. No scleral icterus. Neck: Neck supple. Trachea midline. Cardiovascular: Normal rate, regular rhythm and intact distal pulses. No M/R/G Pulmonary/chest: Effort normal and breath sounds normal.  No wheezing, rales or rhonchi. Abdominal: Soft, nontender, nondistended. Well-healed vertical incision below the umbilicus Bowel sounds active throughout. There are no masses palpable. No hepatosplenomegaly. Extremities: no clubbing, cyanosis, or edema Lymphadenopathy: No cervical adenopathy noted. Neurological: Alert and oriented to person place and time. Skin: Skin is warm and dry. No rashes noted. Psychiatric: Normal mood and affect. Behavior is normal.  Labs; --he reports this is being followed by his primary care doctor in [place]. He recalls his last hemoglobin being greater than 12.    Assessment & Plan:  71-year-old male with a past medical history of sigmoid colon cancer diagnosed in October 2012 status post sigmoidectomy, CAD, GERD, ischemic cardiomyopathy, and hypertension who is seen in followup  1. Sigmoid colon cancer -- the patient's status post resection, unfortunately he did have 6 of 23 lymph nodes positive for tumor. He never sought oncology evaluation, and has no desire to do so now. This makes me somewhat concerned about his overall chance for complete she work, but he certainly seems to be doing well now. He has not had a complete colonoscopy to clear the right colon, and he is due for this now. He understands the recommendation colonoscopy and wishes to proceed. This will be scheduled for him in the upcoming weeks and he is requested MiraLAX prep which is okay with me. Ideally he would at least have an oncology consultation, but he does not want this at present.  Further recommendations after colonoscopy 

## 2011-08-31 NOTE — Interval H&P Note (Signed)
History and Physical Interval Note: Patient presents today for outpatient colonoscopy.  Tolerated prep well.  No complain today. Plan is for completion colonoscopy today, with hx of left-sided colon cancer and incomplete colonoscopy in late 2012 after sigmoidectomy.  08/31/2011 8:08 AM  Mark Clayton  has presented today for surgery, with the diagnosis of colon ca s/p resection  The various methods of treatment have been discussed with the patient and family. After consideration of risks, benefits and other options for treatment, the patient has consented to  Procedure(s) (LRB): COLONOSCOPY (N/A) as a surgical intervention .  The patients' history has been reviewed, patient examined, no change in status, stable for surgery.  I have reviewed the patients' chart and labs.  Questions were answered to the patient's satisfaction.     Beverley Fiedler, MD

## 2011-08-31 NOTE — Op Note (Signed)
[facility] [address] [place], [place]  [address]  COLONOSCOPY PROCEDURE REPORT  PATIENT:  Mark Clayton, Mark Clayton  MR#:  [id number] BIRTHDATE:  08-27-1940, 70 yrs. old  GENDER:  male ENDOSCOPIST:  Carie Caddy. Helaina Stefano, MD REF. BY: PROCEDURE DATE:  08/31/2011 PROCEDURE:  Colonoscopy with snare polypectomy, Colon with cold biopsy polypectomy, Colonoscopy with biopsy ASA CLASS:  Class III INDICATIONS:  history of sigmoid colon cancer 2012, s/p sigmoidectomy MEDICATIONS:   Fentanyl 50 mcg IV, Versed 4 mg IV  DESCRIPTION OF PROCEDURE:   After the risks benefits and alternatives of the procedure were thoroughly explained, informed consent was obtained.  Digital rectal exam was performed and revealed no rectal masses.   The Pentax Colonoscope [id number] endoscope was introduced through the anus and advanced to the terminal ileum which was intubated for a short distance, without limitations.  The quality of the prep was good, using MoviPrep. The instrument was then slowly withdrawn as the colon was fully examined. <<PROCEDUREIMAGES>>  FINDINGS:  The terminal ileum appeared normal.  A 6 mm sessile polyp was found in the ascending colon. Polyp was snared without cautery. Retrieval was successful.   A 2 mm sessile polyp was found in the ascending colon. The polyp was removed using cold biopsy forceps.  Two sessile polyps measuring 5 - 6 mm were found in the transverse colon. Polyps were snared without cautery. Retrieval was successful.   A 12 mm sessile polyp was found in the descending colon. Polyp was snared, then cauterized with monopolar cautery (magnet placed over ICD during cautery). Retrieval was successful. There was a surgical anastomosis in the sigmoid colon characterized by erythema and granular appearing tissue.  No ulcerations seen. Multiple biopsies were obtained to exclude residual adenoma/carcinoma and sent to pathology.  Scattered diverticula were found in the left colon.    Retroflexed views in the rectum revealed no abnormalities.  The scope was then withdrawn from the cecum and the procedure completed.  COMPLICATIONS:  None  ENDOSCOPIC IMPRESSION: 1) Normal terminal ileum 2) Sessile polyp in the ascending colon. Removed and sent to pathology. 3) Sessile polyp in the ascending colon. Removed and sent to pathology. 4) Two polyps in the transverse colon. Removed and sent to pathology. 5) Sessile polyp in the descending colon. Removed and sent to pathology. 6) Anastomosis in the sigmoid colon with erythema and granulation tissue.  Multiple biopsies obtained. 7) Diverticula, scattered in the left colon  RECOMMENDATIONS: 1) Hold aspirin, aspirin products, and anti-inflammatory medication for 2 weeks. 2) Await pathology results 3) Timing of repeat colonoscopy will be determined by pathology findings. 4) You will receive a letter within 1-2 weeks with the results of your biopsy as well as final recommendations. Please call my office if you have not received a letter after 3 weeks.  Carie Caddy. Rhea Belton, MD  CC:  The Patient [name], MD [name] MD  n. Rosalie DoctorCarie Caddy. [name] at 08/31/2011 09:10 AM  Teresita Madura, [phone]

## 2011-09-01 ENCOUNTER — Encounter (HOSPITAL_COMMUNITY): Payer: Self-pay | Admitting: Internal Medicine

## 2011-09-02 ENCOUNTER — Encounter: Payer: Self-pay | Admitting: *Deleted

## 2011-09-02 DIAGNOSIS — Z95810 Presence of automatic (implantable) cardiac defibrillator: Secondary | ICD-10-CM | POA: Insufficient documentation

## 2011-09-07 ENCOUNTER — Ambulatory Visit (INDEPENDENT_AMBULATORY_CARE_PROVIDER_SITE_OTHER): Payer: Medicare Other | Admitting: *Deleted

## 2011-09-07 ENCOUNTER — Encounter: Payer: Self-pay | Admitting: Internal Medicine

## 2011-09-07 DIAGNOSIS — I2589 Other forms of chronic ischemic heart disease: Secondary | ICD-10-CM

## 2011-09-07 LAB — ICD DEVICE OBSERVATION
BATTERY VOLTAGE: 3.1389 V
BRDY-0002RV: 40 {beats}/min
CHARGE TIME: 9.589 s
DEV-0020ICD: NEGATIVE
FVT: 0
PACEART VT: 0
RV LEAD AMPLITUDE: 20 mv
RV LEAD IMPEDENCE ICD: 551 Ohm
RV LEAD THRESHOLD: 1.25 V
TOT-0001: 1
TOT-0002: 0
TOT-0006: 20100913000000
TZAT-0001FASTVT: 1
TZAT-0001SLOWVT: 1
TZAT-0002FASTVT: NEGATIVE
TZAT-0002SLOWVT: NEGATIVE
TZAT-0012FASTVT: 200 ms
TZAT-0012SLOWVT: 200 ms
TZAT-0018FASTVT: NEGATIVE
TZAT-0018SLOWVT: NEGATIVE
TZAT-0019FASTVT: 8 V
TZAT-0019SLOWVT: 8 V
TZAT-0020FASTVT: 1.5 ms
TZAT-0020SLOWVT: 1.5 ms
TZON-0003SLOWVT: 370 ms
TZON-0003VSLOWVT: 370 ms
TZON-0004SLOWVT: 16
TZON-0004VSLOWVT: 32
TZON-0005SLOWVT: 12
TZST-0001FASTVT: 2
TZST-0001FASTVT: 3
TZST-0001FASTVT: 4
TZST-0001FASTVT: 5
TZST-0001FASTVT: 6
TZST-0001SLOWVT: 2
TZST-0001SLOWVT: 3
TZST-0001SLOWVT: 4
TZST-0001SLOWVT: 5
TZST-0001SLOWVT: 6
TZST-0002FASTVT: NEGATIVE
TZST-0002FASTVT: NEGATIVE
TZST-0002FASTVT: NEGATIVE
TZST-0002FASTVT: NEGATIVE
TZST-0002FASTVT: NEGATIVE
TZST-0002SLOWVT: NEGATIVE
TZST-0002SLOWVT: NEGATIVE
TZST-0002SLOWVT: NEGATIVE
TZST-0002SLOWVT: NEGATIVE
TZST-0002SLOWVT: NEGATIVE
VENTRICULAR PACING ICD: 0.09 pct
VF: 0

## 2011-09-07 NOTE — Progress Notes (Signed)
ICD check 

## 2011-12-11 ENCOUNTER — Encounter: Payer: Self-pay | Admitting: Internal Medicine

## 2011-12-11 ENCOUNTER — Ambulatory Visit (INDEPENDENT_AMBULATORY_CARE_PROVIDER_SITE_OTHER): Payer: Medicare Other | Admitting: Internal Medicine

## 2011-12-11 VITALS — BP 152/81 | HR 64 | Ht 72.0 in | Wt 179.4 lb

## 2011-12-11 DIAGNOSIS — I2589 Other forms of chronic ischemic heart disease: Secondary | ICD-10-CM

## 2011-12-11 DIAGNOSIS — Z95810 Presence of automatic (implantable) cardiac defibrillator: Secondary | ICD-10-CM

## 2011-12-11 DIAGNOSIS — I509 Heart failure, unspecified: Secondary | ICD-10-CM

## 2011-12-11 DIAGNOSIS — E785 Hyperlipidemia, unspecified: Secondary | ICD-10-CM

## 2011-12-11 DIAGNOSIS — I359 Nonrheumatic aortic valve disorder, unspecified: Secondary | ICD-10-CM

## 2011-12-11 LAB — ICD DEVICE OBSERVATION
BATTERY VOLTAGE: 3.1389 V
BRDY-0002RV: 40 {beats}/min
CHARGE TIME: 9.589 s
DEV-0020ICD: NEGATIVE
FVT: 0
PACEART VT: 0
RV LEAD AMPLITUDE: 31.625 mv
RV LEAD IMPEDENCE ICD: 494 Ohm
RV LEAD THRESHOLD: 1.25 V
TOT-0001: 1
TOT-0002: 0
TOT-0006: 20100913000000
TZAT-0001FASTVT: 1
TZAT-0001SLOWVT: 1
TZAT-0002FASTVT: NEGATIVE
TZAT-0002SLOWVT: NEGATIVE
TZAT-0012FASTVT: 200 ms
TZAT-0012SLOWVT: 200 ms
TZAT-0018FASTVT: NEGATIVE
TZAT-0018SLOWVT: NEGATIVE
TZAT-0019FASTVT: 8 V
TZAT-0019SLOWVT: 8 V
TZAT-0020FASTVT: 1.5 ms
TZAT-0020SLOWVT: 1.5 ms
TZON-0003SLOWVT: 370 ms
TZON-0003VSLOWVT: 370 ms
TZON-0004SLOWVT: 16
TZON-0004VSLOWVT: 32
TZON-0005SLOWVT: 12
TZST-0001FASTVT: 2
TZST-0001FASTVT: 3
TZST-0001FASTVT: 4
TZST-0001FASTVT: 5
TZST-0001FASTVT: 6
TZST-0001SLOWVT: 2
TZST-0001SLOWVT: 3
TZST-0001SLOWVT: 4
TZST-0001SLOWVT: 5
TZST-0001SLOWVT: 6
TZST-0002FASTVT: NEGATIVE
TZST-0002FASTVT: NEGATIVE
TZST-0002FASTVT: NEGATIVE
TZST-0002FASTVT: NEGATIVE
TZST-0002FASTVT: NEGATIVE
TZST-0002SLOWVT: NEGATIVE
TZST-0002SLOWVT: NEGATIVE
TZST-0002SLOWVT: NEGATIVE
TZST-0002SLOWVT: NEGATIVE
TZST-0002SLOWVT: NEGATIVE
VENTRICULAR PACING ICD: 0.11 pct
VF: 0

## 2011-12-11 MED ORDER — LOSARTAN POTASSIUM 50 MG PO TABS: 50.0000 mg | ORAL_TABLET | Freq: Every day | ORAL | Status: DC

## 2011-12-11 NOTE — Assessment & Plan Note (Signed)
.  dfnb The patient's device was interrogated.  The information was reviewed. No changes were made in the programming.    

## 2011-12-11 NOTE — Patient Instructions (Signed)
Your physician has recommended you make the following change in your medication:  1) Start losartan 50 mg one tablet by mouth once daily.  Your physician recommends that you schedule a follow-up appointment in: 3 months with Kristin/ Gunnar Fusi for a device.  Your physician wants you to follow-up in: 1 year with Dr. Graciela Husbands. You will receive a reminder letter in the mail two months in advance. If you don't receive a letter, please call our office to schedule the follow-up appointment.

## 2011-12-11 NOTE — Assessment & Plan Note (Signed)
It is appropriate that he be on ARB therapy; he is intolerant of ACE is secondary to cough. We'll start him on losartan 10. He is to get blood work checked in 2 weeks by his PCP for renal surveillance. Renal function January 2013 was normal

## 2011-12-11 NOTE — Assessment & Plan Note (Signed)
Does not need early reevaluation of his valve.

## 2011-12-11 NOTE — Progress Notes (Signed)
Patient Care Team: [name] as PCP - General (Family Medicine)   HPI  Mark Clayton is a 71 y.o. male seen in followup for ischemic heart disease. He is status post ICD implantation for primary prevention with 6949-lead. He recently underwent device generator replacement with insertion of a new right ventricular pace sense lead.  He underwent stress testing in Jan 2012 for preopclearance which showed no ischemia,   his last echo from fall 2011 and was notable mild  aortic stenosis with a mean gradient of 7 and a peak gradient of 13; ejection 35% with moderate TR and mild pulmonary hypertension  The patient denies chest pain, shortness of breath, nocturnal dyspnea, orthopnea or peripheral edema.  There have been no palpitations, lightheadedness or syncope.   In the last year he has undergone partial colectomy for cancer. It is felt that they got it all. A recent surveillance colonoscopy was clear  Past Medical History  Diagnosis Date  . Ischemic cardiomyopathy     proir bypass. EF 25%. (Master study EF >20%)  . Carotid bruit   . Hyperlipidemia   . CAD (coronary artery disease)     severe 3 vessel CAD w/unstable angina and acute MI  . HTN (hypertension)     systolic  . Myocardial infarct 02-06-11    '03-MI  . GERD (gastroesophageal reflux disease) 02-06-11    reflux is controlled-Omeprazole  . Colon cancer 02-06-11     Sigmoid  . Anemia 02-06-11    takes oral iron  . ICD (implantable cardiac defibrillator) in place   . Pacemaker     Past Surgical History  Procedure Date  . Cardiac defibrillator placement 01/17/03    6949 lead. medtronic. remote-no; with later revision  . Inguinal hernia repair     right  . Coronary artery bypass graft 02-06-11    11'03 6 vessel bypass  . Cataract extraction w/ intraocular lens  implant, bilateral 02-06-11    '09-june/ july-Dr. Dione Booze  . Colon resection 02/09/2011    Procedure: LAPAROSCOPIC SIGMOID COLON RESECTION;  Surgeon: Valarie Merino, MD;  Location: [facility];  Service: General;  Laterality: N/A;  Laparoscopic Assisted Sigmoid Colectomy  . Colonoscopy 08/31/2011    Procedure: COLONOSCOPY;  Surgeon: Beverley Fiedler, MD;  Location: [facility];  Service: Gastroenterology;  Laterality: N/A;    Current Outpatient Prescriptions  Medication Sig Dispense Refill  . aspirin 81 MG tablet Take 81 mg by mouth at bedtime.       . carvedilol (COREG) 25 MG tablet Take 12.5 mg by mouth at bedtime. Take in the pm       . carvedilol (COREG) 6.25 MG tablet Take 3.5 mg by mouth every morning.       . furosemide (LASIX) 20 MG tablet Take 20 mg by mouth every morning.       . nitroGLYCERIN (NITROSTAT) 0.4 MG SL tablet Place 0.4 mg under the tongue every 5 (five) minutes as needed. Chest pain         . omeprazole (PRILOSEC) 20 MG capsule Take 1 capsule (20 mg total) by mouth daily.  90 capsule  3  . simvastatin (ZOCOR) 80 MG tablet Take 0.5 tablets (40 mg total) by mouth at bedtime.  90 tablet  3    Allergies  Allergen Reactions  . Latex Rash    Review of Systems negative except from HPI and PMH  Physical Exam BP 152/81  Pulse 64  Ht 6' (1.829 m)  Wt 179  lb 6.4 oz (81.375 kg)  BMI 24.33 kg/m2 Well developed and well nourished in no acute distress HENT normal E scleral and icterus clear Neck Supple JVP 6-7t; carotids brisk and full Clear to ausculation Regular rate and rhythm,  S4 and a 1/6 murmur along the right sternal border  Soft with active bowel sounds; no midline pulsation No clubbing cyanosis none Edema Alert and oriented, grossly normal motor and sensory function Skin Warm and Dry  Electrocardiogram demonstrates sinus rhythm at 64 Intervals 18/12/40 Axis -38 LVH and repol Assessment and  Plan

## 2011-12-11 NOTE — Assessment & Plan Note (Signed)
Euvolemic and stable

## 2011-12-11 NOTE — Assessment & Plan Note (Signed)
The patient's LDL was 114 in January. That was following hospitalization and he was not on a statin. Repeat blood work is pending in 2 weeks. I have reiterated that his target is 70.

## 2012-01-20 ENCOUNTER — Other Ambulatory Visit: Payer: Self-pay | Admitting: *Deleted

## 2012-01-20 MED ORDER — SIMVASTATIN 80 MG PO TABS: 40.0000 mg | ORAL_TABLET | Freq: Every day | ORAL | Status: DC

## 2012-01-20 MED ORDER — FUROSEMIDE 20 MG PO TABS: 20.0000 mg | ORAL_TABLET | ORAL | Status: DC

## 2012-01-20 NOTE — Telephone Encounter (Signed)
Refilled Simvastatin and Furosemide.

## 2012-01-22 ENCOUNTER — Other Ambulatory Visit: Payer: Self-pay | Admitting: *Deleted

## 2012-01-22 MED ORDER — FUROSEMIDE 20 MG PO TABS: 20.0000 mg | ORAL_TABLET | ORAL | Status: DC

## 2012-01-22 MED ORDER — SIMVASTATIN 80 MG PO TABS: 40.0000 mg | ORAL_TABLET | Freq: Every day | ORAL | Status: DC

## 2012-03-09 ENCOUNTER — Encounter: Payer: Self-pay | Admitting: *Deleted

## 2012-03-09 DIAGNOSIS — Z95810 Presence of automatic (implantable) cardiac defibrillator: Secondary | ICD-10-CM | POA: Insufficient documentation

## 2012-03-14 ENCOUNTER — Ambulatory Visit (INDEPENDENT_AMBULATORY_CARE_PROVIDER_SITE_OTHER): Payer: Medicare Other | Admitting: *Deleted

## 2012-03-14 ENCOUNTER — Encounter: Payer: Self-pay | Admitting: Internal Medicine

## 2012-03-14 DIAGNOSIS — I509 Heart failure, unspecified: Secondary | ICD-10-CM

## 2012-03-14 DIAGNOSIS — I2589 Other forms of chronic ischemic heart disease: Secondary | ICD-10-CM

## 2012-03-14 LAB — ICD DEVICE OBSERVATION
BATTERY VOLTAGE: 3.1253 V
BRDY-0002RV: 40 {beats}/min
CHARGE TIME: 9.769 s
DEV-0020ICD: NEGATIVE
FVT: 0
PACEART VT: 0
RV LEAD AMPLITUDE: 31.625 mv
RV LEAD IMPEDENCE ICD: 532 Ohm
RV LEAD THRESHOLD: 1.25 V
TOT-0001: 1
TOT-0002: 0
TOT-0006: 20100913000000
TZAT-0001FASTVT: 1
TZAT-0001SLOWVT: 1
TZAT-0002FASTVT: NEGATIVE
TZAT-0002SLOWVT: NEGATIVE
TZAT-0012FASTVT: 200 ms
TZAT-0012SLOWVT: 200 ms
TZAT-0018FASTVT: NEGATIVE
TZAT-0018SLOWVT: NEGATIVE
TZAT-0019FASTVT: 8 V
TZAT-0019SLOWVT: 8 V
TZAT-0020FASTVT: 1.5 ms
TZAT-0020SLOWVT: 1.5 ms
TZON-0003SLOWVT: 370 ms
TZON-0003VSLOWVT: 370 ms
TZON-0004SLOWVT: 16
TZON-0004VSLOWVT: 32
TZON-0005SLOWVT: 12
TZST-0001FASTVT: 2
TZST-0001FASTVT: 3
TZST-0001FASTVT: 4
TZST-0001FASTVT: 5
TZST-0001FASTVT: 6
TZST-0001SLOWVT: 2
TZST-0001SLOWVT: 3
TZST-0001SLOWVT: 4
TZST-0001SLOWVT: 5
TZST-0001SLOWVT: 6
TZST-0002FASTVT: NEGATIVE
TZST-0002FASTVT: NEGATIVE
TZST-0002FASTVT: NEGATIVE
TZST-0002FASTVT: NEGATIVE
TZST-0002FASTVT: NEGATIVE
TZST-0002SLOWVT: NEGATIVE
TZST-0002SLOWVT: NEGATIVE
TZST-0002SLOWVT: NEGATIVE
TZST-0002SLOWVT: NEGATIVE
TZST-0002SLOWVT: NEGATIVE
VENTRICULAR PACING ICD: 0.12 pct
VF: 0

## 2012-03-14 NOTE — Patient Instructions (Addendum)
Return office visit 06/13/12 @ 9:00am with the device clinic.

## 2012-03-14 NOTE — Progress Notes (Signed)
ICD check with ICM 

## 2012-06-17 ENCOUNTER — Encounter: Payer: Self-pay | Admitting: Internal Medicine

## 2012-06-17 ENCOUNTER — Encounter: Payer: Medicare Other | Admitting: Cardiology

## 2012-06-17 ENCOUNTER — Ambulatory Visit (INDEPENDENT_AMBULATORY_CARE_PROVIDER_SITE_OTHER): Payer: Medicare Other | Admitting: *Deleted

## 2012-06-17 DIAGNOSIS — I2589 Other forms of chronic ischemic heart disease: Secondary | ICD-10-CM

## 2012-06-17 DIAGNOSIS — I509 Heart failure, unspecified: Secondary | ICD-10-CM

## 2012-06-17 LAB — ICD DEVICE OBSERVATION
BATTERY VOLTAGE: 3.0912 V
CHARGE TIME: 9.919 s
DEV-0020ICD: NEGATIVE
FVT: 0
PACEART VT: 0
RV LEAD AMPLITUDE: 31.625 mv
RV LEAD IMPEDENCE ICD: 532 Ohm
RV LEAD THRESHOLD: 1.25 V
RV LEAD THRESHOLD: 1.5 V
RV LEAD THRESHOLD: 1.5 V
TOT-0001: 1
TOT-0002: 0
TOT-0006: 20100913000000
TZAT-0001FASTVT: 1
TZAT-0001SLOWVT: 1
TZAT-0002FASTVT: NEGATIVE
TZAT-0002SLOWVT: NEGATIVE
TZAT-0012FASTVT: 200 ms
TZAT-0012SLOWVT: 200 ms
TZAT-0018FASTVT: NEGATIVE
TZAT-0018SLOWVT: NEGATIVE
TZAT-0019FASTVT: 8 V
TZAT-0019SLOWVT: 8 V
TZAT-0020FASTVT: 1.5 ms
TZAT-0020SLOWVT: 1.5 ms
TZON-0003SLOWVT: 370 ms
TZON-0003VSLOWVT: 370 ms
TZON-0004SLOWVT: 16
TZON-0004VSLOWVT: 32
TZON-0005SLOWVT: 12
TZST-0001FASTVT: 2
TZST-0001FASTVT: 3
TZST-0001FASTVT: 4
TZST-0001FASTVT: 5
TZST-0001FASTVT: 6
TZST-0001SLOWVT: 2
TZST-0001SLOWVT: 3
TZST-0001SLOWVT: 4
TZST-0001SLOWVT: 5
TZST-0001SLOWVT: 6
TZST-0002FASTVT: NEGATIVE
TZST-0002FASTVT: NEGATIVE
TZST-0002FASTVT: NEGATIVE
TZST-0002FASTVT: NEGATIVE
TZST-0002FASTVT: NEGATIVE
TZST-0002SLOWVT: NEGATIVE
TZST-0002SLOWVT: NEGATIVE
TZST-0002SLOWVT: NEGATIVE
TZST-0002SLOWVT: NEGATIVE
TZST-0002SLOWVT: NEGATIVE
VENTRICULAR PACING ICD: 0.17 pct
VF: 0

## 2012-06-17 NOTE — Progress Notes (Signed)
ICD check with ICM 

## 2012-07-08 ENCOUNTER — Telehealth: Payer: Self-pay | Admitting: Internal Medicine

## 2012-07-08 NOTE — Telephone Encounter (Signed)
Spoke to pt. Told him Dr. Rhea Belton was not in today, and I will talk to him on Monday regarding when we can schedule this appointment.  Pt would like to have it on a Monday if possible, since it's his day off during the week.  I told him I will call him next week. Pt was appreciative and stated understanding.

## 2012-07-25 ENCOUNTER — Other Ambulatory Visit: Payer: Self-pay | Admitting: Gastroenterology

## 2012-07-25 ENCOUNTER — Telehealth: Payer: Self-pay | Admitting: Gastroenterology

## 2012-07-25 DIAGNOSIS — Z85038 Personal history of other malignant neoplasm of large intestine: Secondary | ICD-10-CM

## 2012-07-25 MED ORDER — METOCLOPRAMIDE HCL 5 MG PO TABS: 5.0000 mg | ORAL_TABLET | Freq: Four times a day (QID) | ORAL | Status: DC

## 2012-07-25 NOTE — Telephone Encounter (Signed)
Spoke to pt. Told him his recall colonoscopy will be at [facility] on 09/19/2012 @ 1:30 and to arrive at 11:00am I told him to call me when he gets his instructions with any questions he has, he stated he wanted to do a miralax prep,I told him we can do that. Pt verbalized understanding.

## 2012-07-26 ENCOUNTER — Encounter: Payer: Self-pay | Admitting: Internal Medicine

## 2012-07-29 ENCOUNTER — Other Ambulatory Visit: Payer: Self-pay | Admitting: Gastroenterology

## 2012-07-29 ENCOUNTER — Telehealth: Payer: Self-pay | Admitting: Internal Medicine

## 2012-07-29 DIAGNOSIS — Z85038 Personal history of other malignant neoplasm of large intestine: Secondary | ICD-10-CM

## 2012-07-29 MED ORDER — METOCLOPRAMIDE HCL 5 MG PO TABS: 5.0000 mg | ORAL_TABLET | Freq: Four times a day (QID) | ORAL | Status: DC

## 2012-07-29 NOTE — Telephone Encounter (Signed)
lvm for pt saying his reglan Rx was sent in to his pharmacy, he would not have gotten it with his packet of instructions

## 2012-07-29 NOTE — Telephone Encounter (Signed)
Patient states he received the paperwork in the mail regarding his procedure at [facility].  He says there was no prescription for nausea in the letter.

## 2012-08-01 ENCOUNTER — Other Ambulatory Visit: Payer: Self-pay | Admitting: Gastroenterology

## 2012-08-01 MED ORDER — METOCLOPRAMIDE HCL 5 MG PO TABS: 5.0000 mg | ORAL_TABLET | Freq: Four times a day (QID) | ORAL | Status: DC

## 2012-08-29 ENCOUNTER — Other Ambulatory Visit: Payer: Self-pay | Admitting: Gastroenterology

## 2012-08-29 ENCOUNTER — Telehealth: Payer: Self-pay | Admitting: Gastroenterology

## 2012-08-29 NOTE — Telephone Encounter (Signed)
Spoke to pt. Told him procedure is scheduled for 09/05/2012 @ 3pm. I am mailing instructions today and for him to call me when he gets them so we can go over them. Pt said he has the last set of instructions we went over, I said that's goo, Im still going to send new ones out with correct dates and times. Pt verbalized understanding.

## 2012-08-30 ENCOUNTER — Telehealth: Payer: Self-pay | Admitting: *Deleted

## 2012-08-30 MED ORDER — METOCLOPRAMIDE HCL 5 MG PO TABS: ORAL_TABLET | ORAL | Status: DC

## 2012-08-30 NOTE — Telephone Encounter (Signed)
Pt called because his Reglan is not at his pharmacy- [facility]. Script was ordered at [facility]; reordered. Pt states he understands the rest of his prep.

## 2012-08-31 ENCOUNTER — Telehealth: Payer: Self-pay | Admitting: Gastroenterology

## 2012-08-31 NOTE — Telephone Encounter (Signed)
Pt is doing a miralx prep, letter sent out on 08/29/2012 was generated because we moved up procedure, spoke to pt is confirmed he is doing miralax instructions originally sent

## 2012-09-05 ENCOUNTER — Encounter (HOSPITAL_COMMUNITY): Payer: Self-pay | Admitting: *Deleted

## 2012-09-05 ENCOUNTER — Ambulatory Visit (HOSPITAL_COMMUNITY)
Admission: RE | Admit: 2012-09-05 | Discharge: 2012-09-05 | Disposition: A | Discharge status: Home / Self Care | Payer: Medicare Other | Source: Ambulatory Visit | Attending: Internal Medicine | Admitting: Internal Medicine

## 2012-09-05 ENCOUNTER — Encounter (HOSPITAL_COMMUNITY)
Admission: RE | Disposition: A | Discharge status: Home / Self Care | Payer: Self-pay | Source: Ambulatory Visit | Attending: Internal Medicine

## 2012-09-05 DIAGNOSIS — I2510 Atherosclerotic heart disease of native coronary artery without angina pectoris: Secondary | ICD-10-CM | POA: Insufficient documentation

## 2012-09-05 DIAGNOSIS — K5730 Diverticulosis of large intestine without perforation or abscess without bleeding: Secondary | ICD-10-CM | POA: Insufficient documentation

## 2012-09-05 DIAGNOSIS — K6389 Other specified diseases of intestine: Secondary | ICD-10-CM | POA: Insufficient documentation

## 2012-09-05 DIAGNOSIS — I2589 Other forms of chronic ischemic heart disease: Secondary | ICD-10-CM | POA: Insufficient documentation

## 2012-09-05 DIAGNOSIS — Z85038 Personal history of other malignant neoplasm of large intestine: Secondary | ICD-10-CM | POA: Insufficient documentation

## 2012-09-05 DIAGNOSIS — Z1211 Encounter for screening for malignant neoplasm of colon: Secondary | ICD-10-CM | POA: Insufficient documentation

## 2012-09-05 DIAGNOSIS — K219 Gastro-esophageal reflux disease without esophagitis: Secondary | ICD-10-CM | POA: Insufficient documentation

## 2012-09-05 DIAGNOSIS — D126 Benign neoplasm of colon, unspecified: Secondary | ICD-10-CM | POA: Insufficient documentation

## 2012-09-05 DIAGNOSIS — K635 Polyp of colon: Secondary | ICD-10-CM

## 2012-09-05 DIAGNOSIS — C187 Malignant neoplasm of sigmoid colon: Secondary | ICD-10-CM

## 2012-09-05 DIAGNOSIS — Z951 Presence of aortocoronary bypass graft: Secondary | ICD-10-CM | POA: Insufficient documentation

## 2012-09-05 HISTORY — PX: COLONOSCOPY: SHX5424

## 2012-09-05 SURGERY — COLONOSCOPY
Anesthesia: Moderate Sedation

## 2012-09-05 MED ORDER — SPOT INK MARKER SYRINGE KIT
PACK | SUBMUCOSAL | Status: AC
  Filled 2012-09-05: qty 5

## 2012-09-05 MED ORDER — SODIUM CHLORIDE 0.9 % IV SOLN
INTRAVENOUS | Status: DC
Start: 2012-09-05 — End: 2012-09-05

## 2012-09-05 MED ORDER — DIPHENHYDRAMINE HCL 50 MG/ML IJ SOLN
INTRAMUSCULAR | Status: AC
  Filled 2012-09-05: qty 1

## 2012-09-05 MED ORDER — FENTANYL CITRATE 0.05 MG/ML IJ SOLN
INTRAMUSCULAR | Status: AC
  Filled 2012-09-05: qty 4

## 2012-09-05 MED ORDER — MIDAZOLAM HCL 10 MG/2ML IJ SOLN
INTRAMUSCULAR | Status: AC
  Filled 2012-09-05: qty 4

## 2012-09-05 MED ORDER — MIDAZOLAM HCL 5 MG/5ML IJ SOLN
INTRAMUSCULAR | Status: DC | PRN
  Administered 2012-09-05 (×2): 2 mg via INTRAVENOUS
  Administered 2012-09-05: 1 mg via INTRAVENOUS
  Administered 2012-09-05 (×2): 2 mg via INTRAVENOUS

## 2012-09-05 MED ORDER — FENTANYL CITRATE 0.05 MG/ML IJ SOLN
INTRAMUSCULAR | Status: DC | PRN
  Administered 2012-09-05 (×4): 25 ug via INTRAVENOUS

## 2012-09-05 NOTE — H&P (Signed)
HPI: [name] is a 72 yo male with PMH of sigmoid colon cancer diagnosed in October 2012 status post sigmoidectomy, other adenomatous colon polyps, diverticulosis, CAD, ischemic heart myopathy status post ICD placement, and GERD who presents for surveillance colonoscopy. He reports he is doing well. Mark issues with bowel movements including the blood in his stool or melena.  Of note he did have 6 of 23 resected lymph nodes positive for tumor, but decided not to seek oncologic opinion/therapy.  Last colonoscopy was 08/31/2011, multiple adenomatous polyps were removed. Surgical anastomosis was seen with erythema and granulation tissue. Biopsies of the anastomosis were negative for residual polyp/tumor.  Patient Active Problem List   Diagnosis Date Noted  . ICD -Medtronic 03/09/2012  . Colon polyps 08/31/2011  . Adenocarcinoma of sigmoid colon Node positive 02/09/2011  . Microcytic anemia 01/12/2011  . Chest pain 12/26/2010  . ischemic cardiomyopathy s/p CABG  12/26/2010  . Cardiac defibrillator  MDT VVI 12/26/2010  . LEG CRAMPS 05/10/2009  . DEHYDRATION 04/29/2009  . AORTIC STENOSIS, MILD 04/29/2009  . HYPERLIPIDEMIA, MILD 12/06/2008  . CONGESTIVE HEART FAILURE, SYSTOLIC, CHRONIC 11/26/2008    Past Surgical History  Procedure Laterality Date  . Cardiac defibrillator placement  01/17/03    6949 lead. medtronic. remote-Mark; with later revision  . Inguinal hernia repair      right  . Coronary artery bypass graft  02-06-11    11'03 6 vessel bypass  . Cataract extraction w/ intraocular lens  implant, bilateral  02-06-11    '09-june/ july-Dr. Dione Booze  . Colon resection  02/09/2011    Procedure: LAPAROSCOPIC SIGMOID COLON RESECTION;  Surgeon: Valarie Merino, MD;  Location: [facility];  Service: General;  Laterality: N/A;  Laparoscopic Assisted Sigmoid Colectomy  . Colonoscopy  08/31/2011    Procedure: COLONOSCOPY;  Surgeon: Beverley Fiedler, MD;  Location: [facility];  Service: Gastroenterology;   Laterality: N/A;    Current Facility-Administered Medications  Medication Dose Route Frequency Provider Last Rate Last Dose  . 0.9 %  sodium chloride infusion   Intravenous Continuous Beverley Fiedler, MD        Allergies  Allergen Reactions  . Latex Rash    Family History  Problem Relation Age of Onset  . Hypertension Father   . Hypertension Sister   . Hyperlipidemia Father   . Hyperlipidemia Sister   . Heart disease Father   . Prostate cancer Father   . Alzheimer's disease Mother     History  Substance Use Topics  . Smoking status: Never Smoker   . Smokeless tobacco: Never Used     Comment: tobacco use - Mark  . Alcohol Use: 4.2 oz/week    7 Cans of beer per week     Comment: occasional    ROS: As per history of present illness, otherwise negative  BP 172/96  Pulse 58  Resp 21  Ht 6' (1.829 m)  Wt 170 lb (77.111 kg)  BMI 23.05 kg/m2  SpO2 100% Gen: awake, alert, NAD HEENT: anicteric, op clear CV: RRR Pulm: CTA b/l Abd: soft, NT/ND, +BS throughout Ext: Mark c/c/e Neuro: nonfocal   ASSESSMENT/PLAN: 72 yo male with PMH of sigmoid colon cancer diagnosed in October 2012 status post sigmoidectomy, other adenomatous colon polyps, diverticulosis, CAD, ischemic heart myopathy status post ICD placement, and GERD who presents for surveillance colonoscopy.   1.  Sigmoid colon cancer s/p resection, hx of other adenomatous colon polyps, surveillance colonoscopy -- The nature of the procedure, as well as  the risks, benefits, and alternatives were carefully and thoroughly reviewed with the patient. Ample time for discussion and questions allowed. The patient understood, was satisfied, and agreed to proceed. Further recommendations after procedure

## 2012-09-05 NOTE — Op Note (Signed)
[facility] [address] [place], [address]   COLONOSCOPY PROCEDURE REPORT  PATIENT: Mark Clayton, Mark Clayton  MR#: [id number] BIRTHDATE: 07-Dec-1940 , 71  yrs. old GENDER: Male ENDOSCOPIST: Beverley Fiedler, MD REFERRED BY: PROCEDURE DATE:  09/05/2012 PROCEDURE:   Colonoscopy with biopsy, Colonoscopy with snare polypectomy, and Submucosal injection, any substance ASA CLASS:   Class III INDICATIONS:High risk patient with personal history of colon cancer, elevated risk screening, and Last colonoscopy performed 1 year. MEDICATIONS: These medications were titrated to patient response per physician's verbal order, Fentanyl 100 mcg IV, and Versed 9 mg IV  DESCRIPTION OF PROCEDURE:   After the risks benefits and alternatives of the procedure were thoroughly explained, informed consent was obtained.  A digital rectal exam revealed no rectal mass.   The Pentax Ped Colon [id number]  endoscope was introduced through the anus and advanced to the terminal ileum which was intubated for a short distance. No adverse events experienced. The quality of the prep was Moviprep fair  The instrument was then slowly withdrawn as the colon was fully examined.      COLON FINDINGS: The mucosa appeared normal in the terminal ileum. A one-quarter circumferential mass, measuring 3 X 3cm in size, was found in the distal ascending traverse colon.  Multiple biopsies of the lesion were performed using cold forceps.  A tattoo was applied distal to this lesion   A sessile polyp measuring 10 mm in size was found in the proximal ascending colon.  This polyp was not removed because it is proximal to the previously described mass.   A sessile polyp measuring 5 mm in size was found in the ascending traverse colon.  This polyp was also not removed due to proximity to previously described mass.  Three sessile polyps measuring 4 mm, 8 mm, 8 mm mm in size were found at the hepatic flexure. Polypectomy was  performed using cold snare (1) and using hot snare (2).  All resections were complete and all polyp tissue was completely retrieved.   Three sessile polyps ranging between 3-88mm in size were found in the descending colon.  Polypectomy was performed with cold forceps.  All resections were complete and all polyp tissue was completely retrieved.   Mild diverticulosis was noted in the descending colon.   There was evidence of a congested appearing prior surgical anastomosis in the sigmoid colon. Multiple biopsies of the area were performed.  Retroflexed views revealed no abnormalities.         The scope was withdrawn and the procedure completed.  COMPLICATIONS: There were no complications.  ENDOSCOPIC IMPRESSION: 1.   Normal mucosa in the terminal ileum 2.   One-quarter circumferential mass, measuring 3 X 3cm in size, were found in the ascending traverse colon; multiple biopsies of the lesion were performed; a tattoo was applied 3.   Sessile polyp measuring 10 mm in size was found in the proximal ascending colon; polypectomy not attempted 4.   Sessile polyp measuring 5 mm in size was found in the ascending traverse colon; polypectomy not attempted 5.   Three sessile polyps measuring 4 mm, 8 mm, 8 mm mm in size were found at the hepatic flexure; Polypectomy was performed using cold snare and using hot snare 6.   Three sessile polyps ranging between 3-74mm in size were found in the descending colon; Polypectomy was performed with cold forceps 7.   Mild diverticulosis was noted in the descending colon 8.   There was evidence of prior surgical  anastomosis in the sigmoid colon; multiple biopsies of the area were performed  RECOMMENDATIONS: 1.  Await pathology results 2.  Repeat CT scan of the abdomen and pelvis with contrast 3.  Hold aspirin, aspirin products, and anti-inflammatory medication for 2 weeks. 4.  Followup with Dr.  [name] to discuss right hemicolectomy versus total  colectomy given history of multiple adenomatous colon polyps and colon cancer   eSigned:  Beverley Fiedler, MD 09/05/2012 4:12 PM   cc: The Patient   PATIENT NAME:  Mark Clayton, Mark Clayton MR#: [id number]

## 2012-09-06 ENCOUNTER — Telehealth: Payer: Self-pay | Admitting: *Deleted

## 2012-09-06 ENCOUNTER — Other Ambulatory Visit (INDEPENDENT_AMBULATORY_CARE_PROVIDER_SITE_OTHER): Payer: Medicare Other

## 2012-09-06 DIAGNOSIS — K6389 Other specified diseases of intestine: Secondary | ICD-10-CM

## 2012-09-06 LAB — CBC WITH DIFFERENTIAL/PLATELET
Basophils Absolute: 0 10*3/uL (ref 0.0–0.1)
Basophils Relative: 0.2 % (ref 0.0–3.0)
Eosinophils Absolute: 0.1 10*3/uL (ref 0.0–0.7)
Eosinophils Relative: 0.8 % (ref 0.0–5.0)
HCT: 38.6 % — ABNORMAL LOW (ref 39.0–52.0)
Hemoglobin: 12.7 g/dL — ABNORMAL LOW (ref 13.0–17.0)
Lymphocytes Relative: 17 % (ref 12.0–46.0)
Lymphs Abs: 1.3 10*3/uL (ref 0.7–4.0)
MCHC: 32.9 g/dL (ref 30.0–36.0)
MCV: 83.8 fl (ref 78.0–100.0)
Monocytes Absolute: 0.7 10*3/uL (ref 0.1–1.0)
Monocytes Relative: 8.7 % (ref 3.0–12.0)
Neutro Abs: 5.7 10*3/uL (ref 1.4–7.7)
Neutrophils Relative %: 73.3 % (ref 43.0–77.0)
Platelets: 180 10*3/uL (ref 150.0–400.0)
RBC: 4.61 Mil/uL (ref 4.22–5.81)
RDW: 15.7 % — ABNORMAL HIGH (ref 11.5–14.6)
WBC: 7.8 10*3/uL (ref 4.5–10.5)

## 2012-09-06 LAB — COMPREHENSIVE METABOLIC PANEL
ALT: 13 U/L (ref 0–53)
AST: 19 U/L (ref 0–37)
Albumin: 3.5 g/dL (ref 3.5–5.2)
Alkaline Phosphatase: 42 U/L (ref 39–117)
BUN: 7 mg/dL (ref 6–23)
CO2: 27 mEq/L (ref 19–32)
Calcium: 9.1 mg/dL (ref 8.4–10.5)
Chloride: 96 mEq/L (ref 96–112)
Creatinine, Ser: 1.2 mg/dL (ref 0.4–1.5)
GFR: 65.83 mL/min (ref >60.00)
Glucose, Bld: 75 mg/dL (ref 70–99)
Potassium: 4.7 mEq/L (ref 3.5–5.1)
Sodium: 128 mEq/L — ABNORMAL LOW (ref 135–145)
Total Bilirubin: 1.1 mg/dL (ref 0.3–1.2)
Total Protein: 6.5 g/dL (ref 6.0–8.3)

## 2012-09-06 NOTE — Telephone Encounter (Signed)
Informed pt of CT appt on 09/09/12 at 2pm with prep instructions and then he will see Dr Wenda Low at 2pm that afternoon. Instructions and contrast left at the front desk.

## 2012-09-07 ENCOUNTER — Encounter (HOSPITAL_COMMUNITY): Payer: Self-pay | Admitting: Internal Medicine

## 2012-09-07 ENCOUNTER — Encounter: Payer: Self-pay | Admitting: Internal Medicine

## 2012-09-08 ENCOUNTER — Telehealth: Payer: Self-pay | Admitting: Internal Medicine

## 2012-09-08 NOTE — Telephone Encounter (Signed)
New Problem  Pt has a question about his device. He is schedule for an appt on Monday.

## 2012-09-08 NOTE — Telephone Encounter (Signed)
Patient was wanting to change his appointment but has opted to keep the original for 09/12/12 @ 9:00am.

## 2012-09-09 ENCOUNTER — Ambulatory Visit (INDEPENDENT_AMBULATORY_CARE_PROVIDER_SITE_OTHER): Payer: Medicare Other | Admitting: Surgery

## 2012-09-09 ENCOUNTER — Encounter (INDEPENDENT_AMBULATORY_CARE_PROVIDER_SITE_OTHER): Payer: Self-pay | Admitting: Surgery

## 2012-09-09 ENCOUNTER — Ambulatory Visit (INDEPENDENT_AMBULATORY_CARE_PROVIDER_SITE_OTHER)
Admission: RE | Admit: 2012-09-09 | Discharge: 2012-09-09 | Disposition: A | Discharge status: Home / Self Care | Payer: Medicare Other | Source: Ambulatory Visit | Attending: Internal Medicine | Admitting: Internal Medicine

## 2012-09-09 VITALS — BP 128/60 | HR 62 | Temp 97.8°F | Resp 15 | Ht 72.0 in | Wt 174.0 lb

## 2012-09-09 DIAGNOSIS — K6389 Other specified diseases of intestine: Secondary | ICD-10-CM

## 2012-09-09 DIAGNOSIS — Z86010 Personal history of colonic polyps: Secondary | ICD-10-CM

## 2012-09-09 MED ORDER — IOHEXOL 300 MG/ML  SOLN
100.0000 mL | Freq: Once | INTRAMUSCULAR | Status: AC | PRN
  Administered 2012-09-09: 100 mL via INTRAVENOUS

## 2012-09-09 NOTE — Progress Notes (Signed)
Chief Complaint:  Recurrent right sided polyps of the colon in the face of recent left colon cancer  History of Present Illness:  Mark Clayton is an 72 y.o. male who had recent colonoscopy demonstrating a large polyp in the ascending transverse colon.  Referred for right or total colectomy.  Both discussed with him and we decided upon a right hemicolectomy.    Past Medical History  Diagnosis Date  . Ischemic cardiomyopathy     proir bypass. EF 25%. (Master study EF >20%)  . Carotid bruit   . Hyperlipidemia   . CAD (coronary artery disease)     severe 3 vessel CAD w/unstable angina and acute MI  . HTN (hypertension)     systolic  . Myocardial infarct 02-06-11    '03-MI  . GERD (gastroesophageal reflux disease) 02-06-11    reflux is controlled-Omeprazole  . Colon cancer 02-06-11     Sigmoid  . Anemia 02-06-11    takes oral iron  . ICD (implantable cardiac defibrillator) in place   . Pacemaker     Past Surgical History  Procedure Laterality Date  . Cardiac defibrillator placement  01/17/03    6949 lead. medtronic. remote-no; with later revision  . Inguinal hernia repair      right  . Coronary artery bypass graft  02-06-11    11'03 6 vessel bypass  . Cataract extraction w/ intraocular lens  implant, bilateral  02-06-11    '09-june/ july-Dr. Dione Booze  . Colon resection  02/09/2011    Procedure: LAPAROSCOPIC SIGMOID COLON RESECTION;  Surgeon: Valarie Merino, MD;  Location: [facility];  Service: General;  Laterality: N/A;  Laparoscopic Assisted Sigmoid Colectomy  . Colonoscopy  08/31/2011    Procedure: COLONOSCOPY;  Surgeon: Beverley Fiedler, MD;  Location: [facility];  Service: Gastroenterology;  Laterality: N/A;  . Colonoscopy N/A 09/05/2012    Procedure: COLONOSCOPY;  Surgeon: Beverley Fiedler, MD;  Location: [facility];  Service: Gastroenterology;  Laterality: N/A;    Current Outpatient Prescriptions  Medication Sig Dispense Refill  . aspirin 81 MG tablet Take 81 mg by mouth at bedtime.        . carvedilol (COREG) 25 MG tablet Take 12.5 mg by mouth at bedtime. Take in the pm       . carvedilol (COREG) 6.25 MG tablet Take 6.25 mg by mouth every morning.       . furosemide (LASIX) 20 MG tablet Take 1 tablet (20 mg total) by mouth every morning.  90 tablet  3  . nitroGLYCERIN (NITROSTAT) 0.4 MG SL tablet Place 0.4 mg under the tongue every 5 (five) minutes as needed. Chest pain         . omeprazole (PRILOSEC) 20 MG capsule Take 1 capsule (20 mg total) by mouth daily.  90 capsule  3  . simvastatin (ZOCOR) 80 MG tablet Take 0.5 tablets (40 mg total) by mouth at bedtime.  90 tablet  3   No current facility-administered medications for this visit.   Latex Family History  Problem Relation Age of Onset  . Hypertension Father   . Hypertension Sister   . Hyperlipidemia Father   . Hyperlipidemia Sister   . Heart disease Father   . Prostate cancer Father   . Alzheimer's disease Mother    Social History:   reports that he has never smoked. He has never used smokeless tobacco. He reports that he drinks about 4.2 ounces of alcohol per week. He reports that he does not use  illicit drugs.   REVIEW OF SYSTEMS - PERTINENT POSITIVES ONLY: Multiple positive nodes on prior sigmoid colectomy  Physical Exam:   Blood pressure 128/60, pulse 62, temperature 97.8 F (36.6 C), temperature source Temporal, resp. rate 15, height 6' (1.829 m), weight 174 lb (78.926 kg). Body mass index is 23.59 kg/(m^2).  Gen:  WDWN WM NAD  Neurological: Alert and oriented to person, place, and time. Motor and sensory function is grossly intact  Head: Normocephalic and atraumatic.  Eyes: Conjunctivae are normal. Pupils are equal, round, and reactive to light. No scleral icterus.  Neck: Normal range of motion. Neck supple. No tracheal deviation or thyromegaly present.  Cardiovascular:  SR without murmurs or gallops.  No carotid bruits Respiratory: Effort normal.  No respiratory distress. No chest wall  tenderness. Breath sounds normal.  No wheezes, rales or rhonchi.  Abdomen:  Well healed incisions.   GU: Musculoskeletal: Normal range of motion. Extremities are nontender. No cyanosis, edema or clubbing noted Lymphadenopathy: No cervical, preauricular, postauricular or axillary adenopathy is present Skin: Skin is warm and dry. No rash noted. No diaphoresis. No erythema. No pallor. Pscyh: Normal mood and affect. Behavior is normal. Judgment and thought content normal.   LABORATORY RESULTS: No results found for this or any previous visit (from the past 48 hour(s)).  RADIOLOGY RESULTS: Ct Abdomen Pelvis W Contrast  09/09/2012   *RADIOLOGY REPORT*  Clinical Data: Evaluate for colon mass in the ascending/transverse area, recent polypectomy, history of sigmoid colon cancer status post resection  CT ABDOMEN AND PELVIS WITH CONTRAST  Technique:  Multidetector CT imaging of the abdomen and pelvis was performed following the standard protocol during bolus administration of intravenous contrast.  Contrast: OMNIPAQUE IOHEXOL 300 MG/ML  SOLN  Comparison: 01/20/2011  Findings: Lung bases are clear.  Cardiomegaly.  ICD leads, incompletely visualized.  Tiny hiatal hernia.  Tiny hypoenhancing lesion in the right hepatic dome (series 2/image 9), likely reflecting a small cyst, unchanged.  Spleen, pancreas, and adrenal glands are within normal limits.  Gallbladder is unremarkable.  No intrahepatic or extrahepatic ductal dilatation.  Small bilateral renal cysts, measuring up to 9 mm in the left lower pole (series 5/image 25) and 8 mm in the right lower pole (series 5/image 28).  No hydronephrosis.  No evidence of bowel obstruction. Duodenal lipoma (series 2/image 39).  Normal appendix.  Prior sigmoid resection with anastomoses in the lower abdomen/pelvis (series 2/image 70).  Known ascending/transverse colonic mass is not evident by CT.  Atherosclerotic calcifications of the abdominal aorta and branch vessels.  No  abdominopelvic ascites.  No suspicious abdominopelvic lymphadenopathy.  Mild prostatomegaly, measuring 5.1 cm in transverse dimension.  Bladder is mildly thick-walled.  Small fat-containing bilateral inguinal hernias.  Degenerative changes of the visualized thoracolumbar spine.  IMPRESSION:  Known ascending/transverse colonic mass is not evident by CT.  Prior sigmoid resection.  Evidence of metastatic disease in the abdomen/pelvis.   Original Report Authenticated By: Charline Bills, M.D.    Problem List: Patient Active Problem List   Diagnosis Date Noted  . Adenocarcinoma of sigmoid colon Node positive 02/09/2011    Priority: High  . ICD -Medtronic 03/09/2012  . Colon polyps 08/31/2011  . Microcytic anemia 01/12/2011  . Chest pain 12/26/2010  . ischemic cardiomyopathy s/p CABG  12/26/2010  . Cardiac defibrillator  MDT VVI 12/26/2010  . LEG CRAMPS 05/10/2009  . DEHYDRATION 04/29/2009  . AORTIC STENOSIS, MILD 04/29/2009  . HYPERLIPIDEMIA, MILD 12/06/2008  . CONGESTIVE HEART FAILURE, SYSTOLIC, CHRONIC 11/26/2008  Assessment & Plan: CT scan report is not congruous-reviewed and no evidence of mets seen.  Will schedule for lap assisted right hemicolectomy    [name], MD, [facility] [phone] beeper [phone]  09/09/2012 3:20 PM

## 2012-09-09 NOTE — Patient Instructions (Signed)
Removal of the Colon (Colectomy) Care After AFTER THE PROCEDURE After surgery, you will be taken to the recovery area where a nurse will watch you and check your progress. After the recovery area you will go to your hospital room. Your surgeon will determine when it is alright for you to take fluids and foods. You will be given pain medications to keep you comfortable. HOME CARE INSTRUCTIONS  Once home, an ice pack applied to the operative site may help with discomfort and keep swelling down.  Change dressings as directed.  Only take over-the-counter or prescription medicines for pain, discomfort, or fever as directed by your caregiver.  You may continue normal diet and activities as directed.  There should be no heavy lifting (more than 10 pounds), strenuous activities or contact sports for three weeks, or as directed.  Keep the wound dry and clean. The wound may be washed gently with soap and water. Gently blot or dab dry following cleansing, without rubbing. Do not take baths, use swimming pools, or use hot tubs for ten days, or as instructed by your caregivers.  If you have a colostomy, care for it as you have been shown. SEEK MEDICAL CARE IF:   There is redness, swelling, or increasing pain in the wound area.  Pus is coming from the wound.  An unexplained oral temperature above 102 F (38.9 C) develops.  You notice a foul smell coming from the wound or dressing.  There is a breaking open of a wound (edges not staying together) after the sutures have been removed.  There is increasing abdominal pain. SEEK IMMEDIATE MEDICAL CARE IF:   A rash develops.  There is difficulty breathing, or development of a reaction or side effects to medications given. Document Released: 10/15/2003 Document Revised: 06/08/2011 Document Reviewed: 04/19/2007 [facility] Patient Information 2014 [place], [place].

## 2012-09-12 ENCOUNTER — Encounter: Payer: Self-pay | Admitting: Internal Medicine

## 2012-09-12 ENCOUNTER — Ambulatory Visit (INDEPENDENT_AMBULATORY_CARE_PROVIDER_SITE_OTHER): Payer: Medicare Other | Admitting: *Deleted

## 2012-09-12 ENCOUNTER — Telehealth: Payer: Self-pay | Admitting: Internal Medicine

## 2012-09-12 DIAGNOSIS — I509 Heart failure, unspecified: Secondary | ICD-10-CM

## 2012-09-12 DIAGNOSIS — I2589 Other forms of chronic ischemic heart disease: Secondary | ICD-10-CM

## 2012-09-12 DIAGNOSIS — Z95810 Presence of automatic (implantable) cardiac defibrillator: Secondary | ICD-10-CM

## 2012-09-12 LAB — ICD DEVICE OBSERVATION
BATTERY VOLTAGE: 3.11 V
BRDY-0002RV: 40 {beats}/min
CHARGE TIME: 9.9 s
DEV-0020ICD: NEGATIVE
RV LEAD AMPLITUDE: 20 mv
RV LEAD IMPEDENCE ICD: 532 Ohm
RV LEAD THRESHOLD: 1.25 V
TZAT-0001FASTVT: 1
TZAT-0001SLOWVT: 1
TZAT-0002FASTVT: NEGATIVE
TZAT-0002SLOWVT: NEGATIVE
TZAT-0012FASTVT: 200 ms
TZAT-0012SLOWVT: 200 ms
TZAT-0018FASTVT: NEGATIVE
TZAT-0018SLOWVT: NEGATIVE
TZAT-0019FASTVT: 8 V
TZAT-0019SLOWVT: 8 V
TZAT-0020FASTVT: 1.5 ms
TZAT-0020SLOWVT: 1.5 ms
TZON-0003SLOWVT: 370 ms
TZON-0003VSLOWVT: 370 ms
TZON-0004SLOWVT: 16
TZON-0004VSLOWVT: 32
TZON-0005SLOWVT: 12
TZST-0001FASTVT: 2
TZST-0001FASTVT: 3
TZST-0001FASTVT: 4
TZST-0001FASTVT: 5
TZST-0001FASTVT: 6
TZST-0001SLOWVT: 2
TZST-0001SLOWVT: 3
TZST-0001SLOWVT: 4
TZST-0001SLOWVT: 5
TZST-0001SLOWVT: 6
TZST-0002FASTVT: NEGATIVE
TZST-0002FASTVT: NEGATIVE
TZST-0002FASTVT: NEGATIVE
TZST-0002FASTVT: NEGATIVE
TZST-0002FASTVT: NEGATIVE
TZST-0002SLOWVT: NEGATIVE
TZST-0002SLOWVT: NEGATIVE
TZST-0002SLOWVT: NEGATIVE
TZST-0002SLOWVT: NEGATIVE
TZST-0002SLOWVT: NEGATIVE
VENTRICULAR PACING ICD: 0.1 pct

## 2012-09-12 NOTE — Telephone Encounter (Signed)
Dr Rhea Belton It has not been since  Sept 2013 since we have seen him,  His lasix dose is 20 mg  Perhaps he should see his PCP as was your initial thought Thanks steve klein

## 2012-09-12 NOTE — Telephone Encounter (Signed)
New Problem:    Patient called in because Dr. Rhea Belton told him that his sodium was too low and that he would have Dr. Odessa Fleming office give him a call back and he has not heard anything yet.  Please call back.

## 2012-09-12 NOTE — Telephone Encounter (Signed)
This encounter was created in error - please disregard.

## 2012-09-12 NOTE — Telephone Encounter (Signed)
Will forward to Dr. Klein for review and recommendations. 

## 2012-09-12 NOTE — Progress Notes (Signed)
Single chamber icd check in clinic.

## 2012-09-13 NOTE — Telephone Encounter (Signed)
Pt aware to follow up with PCP about low NA

## 2012-10-21 ENCOUNTER — Encounter (HOSPITAL_COMMUNITY): Payer: Self-pay | Admitting: Pharmacy Technician

## 2012-10-27 ENCOUNTER — Other Ambulatory Visit (HOSPITAL_COMMUNITY): Payer: Self-pay | Admitting: Surgery

## 2012-10-27 NOTE — Progress Notes (Signed)
CT abd/pelvis 6/14 with notation lung bases clear, EKG 8/13, LOV Dr Graciela Husbands 9/13, last interrogation 6/14 ALL IN EPIC,  Pacer/defibrillator orders signed and on chart

## 2012-10-27 NOTE — Patient Instructions (Addendum)
20 [name]  10/27/2012   Your procedure is scheduled on:  11/04/12  FRIDAY  Report to [facility] at 0700      AM.  Call this number if you have problems the morning of surgery: [phone]     FLEETS ENEMA PER RECTUM Thursday NIGHT AS PER DR Daphine Deutscher INSTRUCTIONS  Remember:   Do not take ANYTHING BY MOUTH AFTER MIDNIGHT  Thursday  NIGHT   Take these medicines the morning of surgery with A SIP OF WATER:  CARVEDILOL, omeprazole   .  Contacts, dentures or partial plates can not be worn to surgery  Leave suitcase in the car. After surgery it may be brought to your room.  For patients admitted to the hospital, checkout time is 11:00 AM day of  discharge.             SPECIAL INSTRUCTIONS- SEE [place] PREPARING FOR SURGERY INSTRUCTION SHEET-     DO NOT WEAR JEWELRY, LOTIONS, POWDERS, OR PERFUMES.  WOMEN-- DO NOT SHAVE LEGS OR UNDERARMS FOR 12 HOURS BEFORE SHOWERS. MEN MAY SHAVE FACE.  Patients discharged the day of surgery will not be allowed to drive home. IF going home the day of surgery, you must have a driver and someone to stay with you for the first 24 hours  Name and phone number of your driver:     ADMISSION                                                                   Please read over the following fact sheets that you were given:  Blood Transfusion Sheet  Information                                                                                   Mark Clayton  PST [phone]  [phone]                 FAILURE TO FOLLOW THESE INSTRUCTIONS MAY RESULT IN  CANCELLATION   OF YOUR SURGERY                                                  Patient Signature _____________________________

## 2012-10-28 ENCOUNTER — Ambulatory Visit (HOSPITAL_COMMUNITY)
Admission: RE | Admit: 2012-10-28 | Discharge: 2012-10-28 | Disposition: A | Discharge status: Home / Self Care | Payer: Medicare Other | Source: Ambulatory Visit | Attending: Surgery | Admitting: Surgery

## 2012-10-28 ENCOUNTER — Encounter (HOSPITAL_COMMUNITY): Payer: Self-pay

## 2012-10-28 ENCOUNTER — Encounter (HOSPITAL_COMMUNITY)
Admission: RE | Admit: 2012-10-28 | Discharge: 2012-10-28 | Disposition: A | Discharge status: Home / Self Care | Payer: Medicare Other | Source: Ambulatory Visit | Attending: Surgery | Admitting: Surgery

## 2012-10-28 DIAGNOSIS — Z951 Presence of aortocoronary bypass graft: Secondary | ICD-10-CM | POA: Insufficient documentation

## 2012-10-28 DIAGNOSIS — I10 Essential (primary) hypertension: Secondary | ICD-10-CM | POA: Insufficient documentation

## 2012-10-28 DIAGNOSIS — Z95810 Presence of automatic (implantable) cardiac defibrillator: Secondary | ICD-10-CM | POA: Insufficient documentation

## 2012-10-28 LAB — BASIC METABOLIC PANEL
BUN: 8 mg/dL (ref 6–23)
CO2: 29 mEq/L (ref 19–32)
Calcium: 9.9 mg/dL (ref 8.4–10.5)
Chloride: 106 mEq/L (ref 96–112)
Creatinine, Ser: 0.85 mg/dL (ref 0.50–1.35)
GFR calc Af Amer: >90 mL/min (ref >90)
GFR calc non Af Amer: 86 mL/min — ABNORMAL LOW (ref >90)
Glucose, Bld: 87 mg/dL (ref 70–99)
Potassium: 4.1 mEq/L (ref 3.5–5.1)
Sodium: 140 mEq/L (ref 135–145)

## 2012-10-28 LAB — CBC
HCT: 39 % (ref 39.0–52.0)
Hemoglobin: 12.6 g/dL — ABNORMAL LOW (ref 13.0–17.0)
MCH: 27 pg (ref 26.0–34.0)
MCHC: 32.3 g/dL (ref 30.0–36.0)
MCV: 83.7 fL (ref 78.0–100.0)
Platelets: 207 10*3/uL (ref 150–400)
RBC: 4.66 MIL/uL (ref 4.22–5.81)
RDW: 14.7 % (ref 11.5–15.5)
WBC: 5.8 10*3/uL (ref 4.0–10.5)

## 2012-11-02 ENCOUNTER — Other Ambulatory Visit: Payer: Self-pay

## 2012-11-03 NOTE — H&P (Signed)
Chief Complaint: Recurrent right sided polyps of the colon in the face of recent left colon cancer  History of Present Illness: Mark Clayton is an 72 y.o. male who had recent colonoscopy demonstrating a large polyp in the ascending transverse colon. Referred for right or total colectomy. Both discussed with him and we decided upon a right hemicolectomy.  Past Medical History   Diagnosis  Date   .  Ischemic cardiomyopathy      proir bypass. EF 25%. (Master study EF >20%)   .  Carotid bruit    .  Hyperlipidemia    .  CAD (coronary artery disease)      severe 3 vessel CAD w/unstable angina and acute MI   .  HTN (hypertension)      systolic   .  Myocardial infarct  02-06-11     '03-MI   .  GERD (gastroesophageal reflux disease)  02-06-11     reflux is controlled-Omeprazole   .  Colon cancer  02-06-11     Sigmoid   .  Anemia  02-06-11     takes oral iron   .  ICD (implantable cardiac defibrillator) in place    .  Pacemaker     Past Surgical History   Procedure  Laterality  Date   .  Cardiac defibrillator placement   01/17/03     6949 lead. medtronic. remote-no; with later revision   .  Inguinal hernia repair       right   .  Coronary artery bypass graft   02-06-11     11'03 6 vessel bypass   .  Cataract extraction w/ intraocular lens implant, bilateral   02-06-11     '09-june/ july-Dr. Dione Booze   .  Colon resection   02/09/2011     Procedure: LAPAROSCOPIC SIGMOID COLON RESECTION; Surgeon: Valarie Merino, MD; Location: [facility]S; Service: General; Laterality: N/A; Laparoscopic Assisted Sigmoid Colectomy   .  Colonoscopy   08/31/2011     Procedure: COLONOSCOPY; Surgeon: Beverley Fiedler, MD; Location: [facility]; Service: Gastroenterology; Laterality: N/A;   .  Colonoscopy  N/A  09/05/2012     Procedure: COLONOSCOPY; Surgeon: Beverley Fiedler, MD; Location: [facility]; Service: Gastroenterology; Laterality: N/A;    Current Outpatient Prescriptions   Medication  Sig  Dispense  Refill   .  aspirin  81 MG tablet  Take 81 mg by mouth at bedtime.     .  carvedilol (COREG) 25 MG tablet  Take 12.5 mg by mouth at bedtime. Take in the pm     .  carvedilol (COREG) 6.25 MG tablet  Take 6.25 mg by mouth every morning.     .  furosemide (LASIX) 20 MG tablet  Take 1 tablet (20 mg total) by mouth every morning.  90 tablet  3   .  nitroGLYCERIN (NITROSTAT) 0.4 MG SL tablet  Place 0.4 mg under the tongue every 5 (five) minutes as needed. Chest pain     .  omeprazole (PRILOSEC) 20 MG capsule  Take 1 capsule (20 mg total) by mouth daily.  90 capsule  3   .  simvastatin (ZOCOR) 80 MG tablet  Take 0.5 tablets (40 mg total) by mouth at bedtime.  90 tablet  3    No current facility-administered medications for this visit.   Latex  Family History   Problem  Relation  Age of Onset   .  Hypertension  Father    .  Hypertension  Sister    .  Hyperlipidemia  Father    .  Hyperlipidemia  Sister    .  Heart disease  Father    .  Prostate cancer  Father    .  Alzheimer's disease  Mother    Social History: reports that he has never smoked. He has never used smokeless tobacco. He reports that he drinks about 4.2 ounces of alcohol per week. He reports that he does not use illicit drugs.  REVIEW OF SYSTEMS - PERTINENT POSITIVES ONLY:  Multiple positive nodes on prior sigmoid colectomy  Physical Exam:  Blood pressure 128/60, pulse 62, temperature 97.8 F (36.6 C), temperature source Temporal, resp. rate 15, height 6' (1.829 m), weight 174 lb (78.926 kg).  Body mass index is 23.59 kg/(m^2).  Gen: WDWN WM NAD  Neurological: Alert and oriented to person, place, and time. Motor and sensory function is grossly intact  Head: Normocephalic and atraumatic.  Eyes: Conjunctivae are normal. Pupils are equal, round, and reactive to light. No scleral icterus.  Neck: Normal range of motion. Neck supple. No tracheal deviation or thyromegaly present.  Cardiovascular: SR without murmurs or gallops. No carotid bruits  Respiratory:  Effort normal. No respiratory distress. No chest wall tenderness. Breath sounds normal. No wheezes, rales or rhonchi.  Abdomen: Well healed incisions.  GU:  Musculoskeletal: Normal range of motion. Extremities are nontender. No cyanosis, edema or clubbing noted Lymphadenopathy: No cervical, preauricular, postauricular or axillary adenopathy is present Skin: Skin is warm and dry. No rash noted. No diaphoresis. No erythema. No pallor. Pscyh: Normal mood and affect. Behavior is normal. Judgment and thought content normal.  LABORATORY RESULTS:  No results found for this or any previous visit (from the past 48 hour(s)).  RADIOLOGY RESULTS:  Ct Abdomen Pelvis W Contrast  09/09/2012 *RADIOLOGY REPORT* Clinical Data: Evaluate for colon mass in the ascending/transverse area, recent polypectomy, history of sigmoid colon cancer status post resection CT ABDOMEN AND PELVIS WITH CONTRAST Technique: Multidetector CT imaging of the abdomen and pelvis was performed following the standard protocol during bolus administration of intravenous contrast. Contrast: OMNIPAQUE IOHEXOL 300 MG/ML SOLN Comparison: 01/20/2011 Findings: Lung bases are clear. Cardiomegaly. ICD leads, incompletely visualized. Tiny hiatal hernia. Tiny hypoenhancing lesion in the right hepatic dome (series 2/image 9), likely reflecting a small cyst, unchanged. Spleen, pancreas, and adrenal glands are within normal limits. Gallbladder is unremarkable. No intrahepatic or extrahepatic ductal dilatation. Small bilateral renal cysts, measuring up to 9 mm in the left lower pole (series 5/image 25) and 8 mm in the right lower pole (series 5/image 28). No hydronephrosis. No evidence of bowel obstruction. Duodenal lipoma (series 2/image 39). Normal appendix. Prior sigmoid resection with anastomoses in the lower abdomen/pelvis (series 2/image 70). Known ascending/transverse colonic mass is not evident by CT. Atherosclerotic calcifications of the abdominal aorta  and branch vessels. No abdominopelvic ascites. No suspicious abdominopelvic lymphadenopathy. Mild prostatomegaly, measuring 5.1 cm in transverse dimension. Bladder is mildly thick-walled. Small fat-containing bilateral inguinal hernias. Degenerative changes of the visualized thoracolumbar spine. IMPRESSION: Known ascending/transverse colonic mass is not evident by CT. Prior sigmoid resection. Evidence of metastatic disease in the abdomen/pelvis. Original Report Authenticated By: Charline Bills, M.D.  Problem List:  Patient Active Problem List    Diagnosis  Date Noted   .  Adenocarcinoma of sigmoid colon Node positive  02/09/2011     Priority: High   .  ICD -Medtronic  03/09/2012   .  Colon polyps  08/31/2011   .  Microcytic anemia  01/12/2011   .  Chest pain  12/26/2010   .  ischemic cardiomyopathy s/p CABG  12/26/2010   .  Cardiac defibrillator MDT VVI  12/26/2010   .  LEG CRAMPS  05/10/2009   .  DEHYDRATION  04/29/2009   .  AORTIC STENOSIS, MILD  04/29/2009   .  HYPERLIPIDEMIA, MILD  12/06/2008   .  CONGESTIVE HEART FAILURE, SYSTOLIC, CHRONIC  11/26/2008   Assessment & Plan:  CT scan report is not congruous-reviewed and no evidence of mets seen. Will schedule for lap assisted right hemicolectomy  [name], MD, [facility]  [phone] beeper  [phone]

## 2012-11-04 ENCOUNTER — Encounter (HOSPITAL_COMMUNITY): Payer: Self-pay | Admitting: Anesthesiology

## 2012-11-04 ENCOUNTER — Encounter (HOSPITAL_COMMUNITY): Payer: Self-pay

## 2012-11-04 ENCOUNTER — Inpatient Hospital Stay (HOSPITAL_COMMUNITY): Payer: Medicare Other | Admitting: Anesthesiology

## 2012-11-04 ENCOUNTER — Encounter (HOSPITAL_COMMUNITY)
Admission: RE | Disposition: A | Discharge status: Home / Self Care | Payer: Self-pay | Source: Ambulatory Visit | Attending: Surgery

## 2012-11-04 ENCOUNTER — Inpatient Hospital Stay (HOSPITAL_COMMUNITY)
Admission: RE | Admit: 2012-11-04 | Discharge: 2012-11-14 | DRG: 330 | Disposition: A | Discharge status: Home / Self Care | Payer: Medicare Other | Source: Ambulatory Visit | Attending: Surgery | Admitting: Surgery

## 2012-11-04 DIAGNOSIS — Z85038 Personal history of other malignant neoplasm of large intestine: Secondary | ICD-10-CM

## 2012-11-04 DIAGNOSIS — D378 Neoplasm of uncertain behavior of other specified digestive organs: Secondary | ICD-10-CM

## 2012-11-04 DIAGNOSIS — R112 Nausea with vomiting, unspecified: Secondary | ICD-10-CM | POA: Diagnosis not present

## 2012-11-04 DIAGNOSIS — I509 Heart failure, unspecified: Secondary | ICD-10-CM | POA: Diagnosis present

## 2012-11-04 DIAGNOSIS — I5022 Chronic systolic (congestive) heart failure: Secondary | ICD-10-CM | POA: Diagnosis present

## 2012-11-04 DIAGNOSIS — R197 Diarrhea, unspecified: Secondary | ICD-10-CM | POA: Diagnosis not present

## 2012-11-04 DIAGNOSIS — I252 Old myocardial infarction: Secondary | ICD-10-CM

## 2012-11-04 DIAGNOSIS — I4949 Other premature depolarization: Secondary | ICD-10-CM | POA: Diagnosis present

## 2012-11-04 DIAGNOSIS — Z95810 Presence of automatic (implantable) cardiac defibrillator: Secondary | ICD-10-CM

## 2012-11-04 DIAGNOSIS — I2510 Atherosclerotic heart disease of native coronary artery without angina pectoris: Secondary | ICD-10-CM | POA: Diagnosis present

## 2012-11-04 DIAGNOSIS — E871 Hypo-osmolality and hyponatremia: Secondary | ICD-10-CM | POA: Diagnosis not present

## 2012-11-04 DIAGNOSIS — D375 Neoplasm of uncertain behavior of rectum: Secondary | ICD-10-CM

## 2012-11-04 DIAGNOSIS — D371 Neoplasm of uncertain behavior of stomach: Secondary | ICD-10-CM

## 2012-11-04 DIAGNOSIS — K560 Paralytic ileus: Secondary | ICD-10-CM | POA: Diagnosis not present

## 2012-11-04 DIAGNOSIS — Z9049 Acquired absence of other specified parts of digestive tract: Secondary | ICD-10-CM

## 2012-11-04 DIAGNOSIS — E785 Hyperlipidemia, unspecified: Secondary | ICD-10-CM | POA: Diagnosis present

## 2012-11-04 DIAGNOSIS — I10 Essential (primary) hypertension: Secondary | ICD-10-CM | POA: Diagnosis present

## 2012-11-04 DIAGNOSIS — Z951 Presence of aortocoronary bypass graft: Secondary | ICD-10-CM

## 2012-11-04 DIAGNOSIS — K219 Gastro-esophageal reflux disease without esophagitis: Secondary | ICD-10-CM | POA: Diagnosis present

## 2012-11-04 DIAGNOSIS — M542 Cervicalgia: Secondary | ICD-10-CM | POA: Diagnosis present

## 2012-11-04 DIAGNOSIS — I2589 Other forms of chronic ischemic heart disease: Secondary | ICD-10-CM | POA: Diagnosis present

## 2012-11-04 DIAGNOSIS — D126 Benign neoplasm of colon, unspecified: Principal | ICD-10-CM | POA: Diagnosis present

## 2012-11-04 HISTORY — PX: LAPAROSCOPIC RIGHT HEMI COLECTOMY: SHX5926

## 2012-11-04 LAB — CBC
HCT: 35.5 % — ABNORMAL LOW (ref 39.0–52.0)
Hemoglobin: 11.8 g/dL — ABNORMAL LOW (ref 13.0–17.0)
MCH: 27.5 pg (ref 26.0–34.0)
MCHC: 33.2 g/dL (ref 30.0–36.0)
MCV: 82.8 fL (ref 78.0–100.0)
Platelets: 172 10*3/uL (ref 150–400)
RBC: 4.29 MIL/uL (ref 4.22–5.81)
RDW: 14.6 % (ref 11.5–15.5)
WBC: 9.7 10*3/uL (ref 4.0–10.5)

## 2012-11-04 LAB — CREATININE, SERUM
Creatinine, Ser: 0.97 mg/dL (ref 0.50–1.35)
GFR calc Af Amer: >90 mL/min (ref >90)
GFR calc non Af Amer: 81 mL/min — ABNORMAL LOW (ref >90)

## 2012-11-04 LAB — TYPE AND SCREEN
ABO/RH(D): B POS
Antibody Screen: NEGATIVE

## 2012-11-04 SURGERY — LAPAROSCOPIC RIGHT HEMI COLECTOMY
Anesthesia: General | Wound class: Clean Contaminated

## 2012-11-04 MED ORDER — HYDROMORPHONE HCL PF 1 MG/ML IJ SOLN
INTRAMUSCULAR | Status: DC | PRN
  Administered 2012-11-04 (×4): 0.5 mg via INTRAVENOUS

## 2012-11-04 MED ORDER — MIDAZOLAM HCL 5 MG/5ML IJ SOLN
INTRAMUSCULAR | Status: DC | PRN
  Administered 2012-11-04: 0.5 mg via INTRAVENOUS

## 2012-11-04 MED ORDER — NITROGLYCERIN 0.4 MG SL SUBL: 0.4000 mg | SUBLINGUAL_TABLET | SUBLINGUAL | Status: DC | PRN

## 2012-11-04 MED ORDER — ONDANSETRON HCL 4 MG/2ML IJ SOLN
INTRAMUSCULAR | Status: DC | PRN
  Administered 2012-11-04 (×2): 2 mg via INTRAVENOUS

## 2012-11-04 MED ORDER — MORPHINE SULFATE 2 MG/ML IJ SOLN
1.0000 mg | INTRAMUSCULAR | Status: DC | PRN
  Administered 2012-11-04 – 2012-11-07 (×18): 1 mg via INTRAVENOUS
  Filled 2012-11-04 (×19): qty 1

## 2012-11-04 MED ORDER — KCL IN DEXTROSE-NACL 20-5-0.45 MEQ/L-%-% IV SOLN
INTRAVENOUS | Status: DC
  Administered 2012-11-04 – 2012-11-05 (×3): via INTRAVENOUS
  Administered 2012-11-06: 1000 mL via INTRAVENOUS
  Filled 2012-11-04 (×7): qty 1000

## 2012-11-04 MED ORDER — DEXTROSE 5 % IV SOLN
1.0000 g | Freq: Four times a day (QID) | INTRAVENOUS | Status: AC
  Administered 2012-11-04: 1 g via INTRAVENOUS
  Filled 2012-11-04 (×2): qty 1

## 2012-11-04 MED ORDER — ONDANSETRON HCL 4 MG PO TABS: 4.0000 mg | ORAL_TABLET | Freq: Four times a day (QID) | ORAL | Status: DC | PRN

## 2012-11-04 MED ORDER — 0.9 % SODIUM CHLORIDE (POUR BTL) OPTIME
TOPICAL | Status: DC | PRN
  Administered 2012-11-04: 2000 mL

## 2012-11-04 MED ORDER — VITAMINS A & D EX OINT
TOPICAL_OINTMENT | CUTANEOUS | Status: AC
  Filled 2012-11-04: qty 5

## 2012-11-04 MED ORDER — CARVEDILOL 6.25 MG PO TABS
6.2500 mg | ORAL_TABLET | Freq: Every morning | ORAL | Status: DC
  Administered 2012-11-05 – 2012-11-06 (×2): 6.25 mg via ORAL
  Filled 2012-11-04 (×3): qty 1

## 2012-11-04 MED ORDER — FENTANYL CITRATE 0.05 MG/ML IJ SOLN: 25.0000 ug | INTRAMUSCULAR | Status: DC | PRN

## 2012-11-04 MED ORDER — LIDOCAINE HCL (CARDIAC) 20 MG/ML IV SOLN
INTRAVENOUS | Status: DC | PRN
  Administered 2012-11-04: 30 mg via INTRAVENOUS

## 2012-11-04 MED ORDER — CARVEDILOL 25 MG PO TABS
25.0000 mg | ORAL_TABLET | Freq: Every day | ORAL | Status: DC
  Administered 2012-11-04 – 2012-11-05 (×2): 25 mg via ORAL
  Filled 2012-11-04 (×4): qty 1

## 2012-11-04 MED ORDER — PROMETHAZINE HCL 25 MG/ML IJ SOLN: 6.2500 mg | INTRAMUSCULAR | Status: DC | PRN

## 2012-11-04 MED ORDER — LACTATED RINGERS IR SOLN
Status: DC | PRN
  Administered 2012-11-04: 1000 mL

## 2012-11-04 MED ORDER — DEXTROSE 5 % IV SOLN
2.0000 g | INTRAVENOUS | Status: AC
  Administered 2012-11-04 (×2): 2 g via INTRAVENOUS
  Filled 2012-11-04: qty 2

## 2012-11-04 MED ORDER — CISATRACURIUM BESYLATE (PF) 10 MG/5ML IV SOLN
INTRAVENOUS | Status: DC | PRN
  Administered 2012-11-04: 3 mg via INTRAVENOUS
  Administered 2012-11-04: 5 mg via INTRAVENOUS
  Administered 2012-11-04: 3 mg via INTRAVENOUS
  Administered 2012-11-04: 2 mg via INTRAVENOUS

## 2012-11-04 MED ORDER — PROPOFOL 10 MG/ML IV BOLUS
INTRAVENOUS | Status: DC | PRN
  Administered 2012-11-04: 150 mg via INTRAVENOUS

## 2012-11-04 MED ORDER — BUPIVACAINE-EPINEPHRINE 0.25% -1:200000 IJ SOLN
INTRAMUSCULAR | Status: AC
  Filled 2012-11-04: qty 1

## 2012-11-04 MED ORDER — MIDAZOLAM HCL 5 MG/5ML IJ SOLN: INTRAMUSCULAR | Status: DC | PRN

## 2012-11-04 MED ORDER — NEOSTIGMINE METHYLSULFATE 1 MG/ML IJ SOLN
INTRAMUSCULAR | Status: DC | PRN
  Administered 2012-11-04: 3 mg via INTRAVENOUS

## 2012-11-04 MED ORDER — LACTATED RINGERS IV SOLN
INTRAVENOUS | Status: DC | PRN
  Administered 2012-11-04 (×2): via INTRAVENOUS

## 2012-11-04 MED ORDER — CEFOXITIN SODIUM-DEXTROSE 1-4 GM-% IV SOLR (PREMIX)
INTRAVENOUS | Status: AC
  Filled 2012-11-04: qty 100

## 2012-11-04 MED ORDER — PHENYLEPHRINE HCL 10 MG/ML IJ SOLN
10000.0000 ug | INTRAVENOUS | Status: DC | PRN
  Administered 2012-11-04: 20 ug/min via INTRAVENOUS

## 2012-11-04 MED ORDER — HEPARIN SODIUM (PORCINE) 5000 UNIT/ML IJ SOLN
5000.0000 [IU] | Freq: Three times a day (TID) | INTRAMUSCULAR | Status: DC
  Administered 2012-11-04 – 2012-11-14 (×29): 5000 [IU] via SUBCUTANEOUS
  Filled 2012-11-04 (×32): qty 1

## 2012-11-04 MED ORDER — HEPARIN SODIUM (PORCINE) 5000 UNIT/ML IJ SOLN
5000.0000 [IU] | Freq: Once | INTRAMUSCULAR | Status: AC
  Administered 2012-11-04: 5000 [IU] via SUBCUTANEOUS
  Filled 2012-11-04: qty 1

## 2012-11-04 MED ORDER — LACTATED RINGERS IV SOLN
INTRAVENOUS | Status: DC
  Administered 2012-11-04: 1000 mL via INTRAVENOUS

## 2012-11-04 MED ORDER — ONDANSETRON HCL 4 MG/2ML IJ SOLN
4.0000 mg | Freq: Four times a day (QID) | INTRAMUSCULAR | Status: DC | PRN
  Administered 2012-11-06: 4 mg via INTRAVENOUS
  Filled 2012-11-04: qty 2

## 2012-11-04 MED ORDER — GLYCOPYRROLATE 0.2 MG/ML IJ SOLN
INTRAMUSCULAR | Status: DC | PRN
  Administered 2012-11-04: 0.4 mg via INTRAVENOUS

## 2012-11-04 MED ORDER — FENTANYL CITRATE 0.05 MG/ML IJ SOLN
INTRAMUSCULAR | Status: DC | PRN
  Administered 2012-11-04: 75 ug via INTRAVENOUS
  Administered 2012-11-04 (×3): 50 ug via INTRAVENOUS
  Administered 2012-11-04: 25 ug via INTRAVENOUS

## 2012-11-04 MED ORDER — KETAMINE HCL 10 MG/ML IJ SOLN
INTRAMUSCULAR | Status: DC | PRN
  Administered 2012-11-04: 10 mg via INTRAVENOUS

## 2012-11-04 MED ORDER — FUROSEMIDE 20 MG PO TABS
20.0000 mg | ORAL_TABLET | Freq: Every morning | ORAL | Status: DC
  Administered 2012-11-04 – 2012-11-06 (×3): 20 mg via ORAL
  Filled 2012-11-04 (×4): qty 1

## 2012-11-04 MED ORDER — SUCCINYLCHOLINE CHLORIDE 20 MG/ML IJ SOLN
INTRAMUSCULAR | Status: DC | PRN
  Administered 2012-11-04: 100 mg via INTRAVENOUS

## 2012-11-04 MED ORDER — MEPERIDINE HCL 50 MG/ML IJ SOLN: 6.2500 mg | INTRAMUSCULAR | Status: DC | PRN

## 2012-11-04 MED ORDER — DEXTROSE 5 % IV SOLN: 2.0000 g | Freq: Once | INTRAVENOUS | Status: DC

## 2012-11-04 MED ORDER — LACTATED RINGERS IV SOLN: INTRAVENOUS | Status: DC

## 2012-11-04 MED ORDER — BUPIVACAINE-EPINEPHRINE PF 0.25-1:200000 % IJ SOLN
INTRAMUSCULAR | Status: DC | PRN
  Administered 2012-11-04: 30 mL

## 2012-11-04 SURGICAL SUPPLY — 73 items
APPLIER CLIP 5 13 M/L LIGAMAX5 (MISCELLANEOUS) ×2
APPLIER CLIP ROT 10 11.4 M/L (STAPLE) ×2
APR CLP MED LRG 11.4X10 (STAPLE) ×1
APR CLP MED LRG 5 ANG JAW (MISCELLANEOUS) ×1
BLADE EXTENDED COATED 6.5IN (ELECTRODE) ×2 IMPLANT
BLADE HEX COATED 2.75 (ELECTRODE) ×2 IMPLANT
BLADE SURG SZ10 CARB STEEL (BLADE) ×2 IMPLANT
CABLE HIGH FREQUENCY MONO STRZ (ELECTRODE) ×2 IMPLANT
CANISTER SUCTION 2500CC (MISCELLANEOUS) ×2 IMPLANT
CELLS DAT CNTRL 66122 CELL SVR (MISCELLANEOUS) ×1 IMPLANT
CLIP APPLIE 5 13 M/L LIGAMAX5 (MISCELLANEOUS) ×1 IMPLANT
CLIP APPLIE ROT 10 11.4 M/L (STAPLE) ×1 IMPLANT
CLOTH BEACON ORANGE TIMEOUT ST (SAFETY) ×2 IMPLANT
COVER MAYO STAND STRL (DRAPES) ×2 IMPLANT
DECANTER SPIKE VIAL GLASS SM (MISCELLANEOUS) ×2 IMPLANT
DRAIN CHANNEL 19F RND (DRAIN) ×2 IMPLANT
DRAPE LAPAROSCOPIC ABDOMINAL (DRAPES) ×2 IMPLANT
DRAPE LG THREE QUARTER DISP (DRAPES) ×2 IMPLANT
DRAPE WARM FLUID 44X44 (DRAPE) ×2 IMPLANT
DRSG OPSITE POSTOP 4X6 (GAUZE/BANDAGES/DRESSINGS) ×2 IMPLANT
DRSG TEGADERM 2-3/8X2-3/4 SM (GAUZE/BANDAGES/DRESSINGS) ×4 IMPLANT
ELECT REM PT RETURN 9FT ADLT (ELECTROSURGICAL) ×2
ELECTRODE REM PT RTRN 9FT ADLT (ELECTROSURGICAL) ×1 IMPLANT
GAUZE SPONGE 2X2 8PLY STRL LF (GAUZE/BANDAGES/DRESSINGS) ×1 IMPLANT
GLOVE BIOGEL M 8.0 STRL (GLOVE) ×4 IMPLANT
GLOVE BIOGEL PI IND STRL 7.0 (GLOVE) ×1 IMPLANT
GLOVE BIOGEL PI INDICATOR 7.0 (GLOVE) ×1
GOWN STRL NON-REIN LRG LVL3 (GOWN DISPOSABLE) ×4 IMPLANT
GOWN STRL REIN XL XLG (GOWN DISPOSABLE) ×4 IMPLANT
KIT BASIN OR (CUSTOM PROCEDURE TRAY) ×2 IMPLANT
LEGGING LITHOTOMY PAIR STRL (DRAPES) IMPLANT
LIGASURE IMPACT 36 18CM CVD LR (INSTRUMENTS) ×2 IMPLANT
NS IRRIG 1000ML POUR BTL (IV SOLUTION) ×2 IMPLANT
PENCIL BUTTON HOLSTER BLD 10FT (ELECTRODE) ×2 IMPLANT
RELOAD PROXIMATE 75MM BLUE (ENDOMECHANICALS) ×2 IMPLANT
RTRCTR WOUND ALEXIS 18CM MED (MISCELLANEOUS) ×2
SCALPEL HARMONIC ACE (MISCELLANEOUS) IMPLANT
SCISSORS LAP 5X35 DISP (ENDOMECHANICALS) ×2 IMPLANT
SEALER TISSUE G2 CVD JAW 35 (ENDOMECHANICALS) IMPLANT
SEALER TISSUE G2 CVD JAW 45CM (ENDOMECHANICALS)
SET IRRIG TUBING LAPAROSCOPIC (IRRIGATION / IRRIGATOR) ×2 IMPLANT
SOLUTION ANTI FOG 6CC (MISCELLANEOUS) ×2 IMPLANT
SPONGE GAUZE 2X2 STER 10/PKG (GAUZE/BANDAGES/DRESSINGS) ×1
SPONGE GAUZE 4X4 12PLY (GAUZE/BANDAGES/DRESSINGS) ×2 IMPLANT
SPONGE LAP 18X18 X RAY DECT (DISPOSABLE) ×4 IMPLANT
STAPLER PROXIMATE 75MM BLUE (STAPLE) ×2 IMPLANT
STAPLER VISISTAT 35W (STAPLE) ×2 IMPLANT
SUCTION POOLE TIP (SUCTIONS) ×2 IMPLANT
SUT PDS AB 1 CTX 36 (SUTURE) ×4 IMPLANT
SUT PDS AB 1 TP1 96 (SUTURE) IMPLANT
SUT PDS AB 4-0 RB1 27 (SUTURE) ×4 IMPLANT
SUT PROLENE 2 0 KS (SUTURE) IMPLANT
SUT SILK 2 0 (SUTURE) ×2
SUT SILK 2 0 SH CR/8 (SUTURE) ×2 IMPLANT
SUT SILK 2-0 18XBRD TIE 12 (SUTURE) ×1 IMPLANT
SUT SILK 3 0 (SUTURE) ×2
SUT SILK 3 0 SH CR/8 (SUTURE) ×4 IMPLANT
SUT SILK 3-0 18XBRD TIE 12 (SUTURE) ×1 IMPLANT
SUT VIC AB 2-0 SH 18 (SUTURE) ×4 IMPLANT
SUT VICRYL 2 0 18  UND BR (SUTURE) ×2
SUT VICRYL 2 0 18 UND BR (SUTURE) ×2 IMPLANT
SYR 30ML LL (SYRINGE) IMPLANT
SYS LAPSCP GELPORT 120MM (MISCELLANEOUS)
SYSTEM LAPSCP GELPORT 120MM (MISCELLANEOUS) IMPLANT
TOWEL OR 17X26 10 PK STRL BLUE (TOWEL DISPOSABLE) ×4 IMPLANT
TRAY FOLEY CATH 14FRSI W/METER (CATHETERS) ×2 IMPLANT
TRAY LAP CHOLE (CUSTOM PROCEDURE TRAY) ×2 IMPLANT
TROCAR XCEL BLUNT TIP 100MML (ENDOMECHANICALS) IMPLANT
TROCAR XCEL NON-BLD 11X100MML (ENDOMECHANICALS) IMPLANT
TROCAR XCEL NON-BLD 5MMX100MML (ENDOMECHANICALS) IMPLANT
TUBING FILTER THERMOFLATOR (ELECTROSURGICAL) ×2 IMPLANT
YANKAUER SUCT BULB TIP 10FT TU (MISCELLANEOUS) ×2 IMPLANT
YANKAUER SUCT BULB TIP NO VENT (SUCTIONS) ×2 IMPLANT

## 2012-11-04 NOTE — Transfer of Care (Signed)
Immediate Anesthesia Transfer of Care Note  Patient: Mark Clayton  Procedure(s) Performed: Procedure(s): LAPAROSCOPIC RIGHT HEMI COLECTOMY (N/A)  Patient Location: PACU  Anesthesia Type:General  Level of Consciousness: awake, sedated and patient cooperative  Airway & Oxygen Therapy: Patient Spontanous Breathing and Patient connected to nasal cannula oxygen  Post-op Assessment: Report given to PACU RN and Post -op Vital signs reviewed and stable  Post vital signs: stable  Complications: No apparent anesthesia complications

## 2012-11-04 NOTE — Op Note (Signed)
Surgeon: Wenda Low, MD, FACS  Asst:  Freddy Jaksch M.D.  Anes:  General endotracheal   Procedure: Laparoscopically assisted right hemicolectomy  Diagnosis: Right colon polyps unresectable and tattooed  Complications: None noted  EBL:   15  cc  Description of Procedure:  The patient was taken to room 11 at [facility] and given general anesthesia. Per colon protocol the abdomen was prepped with ChloraPrep and draped sterilely. A timeout was performed. Access was achieved with a 5 mm 0 Optiview through the left upper quadrant and 2 other 5 mm placed one slowly to the right of the midline above which would be part of our transverse incision was slightly inferiorly. Through these and using harmonic scalpel I was able to mobilize the right colon and found the tattooed area in the distal ascending colon as it went into the hepatic flexure. The right colon and appendix were mobilized and the terminal ileum was found to be stuck down along the pelvic sidewall and this too was mobilized taking it down to what appeared to be an early right inguinal hernia.  Feeling that we had good mobility went ahead and made a transverse incision in place the wound protector, Alexis and then extracted the right colon through this incision. Incision was approximately 9 cm and the skin. The bowel was divided at the terminal ileum and in the transverse colon beyond the site of the tattoo. This was done with some 0.5 mm GIA and the mesentery was then divided with the ligature. The transverse colon and the terminal ileum placed side-by-side a lighted he chose the terminal ileum was directed more down into the pelvis from previous adhesions I then brought the transverse colon were seen very mobile down in a parallel fashion. The suture held these 2 together, which, made an opening in the terminal ileum and the transverse colon through which I inserted a GIA stapler. This was fired creating a common channel. No  bleeding was seen. The common defect was closed from either in with a running 4-0 PDS using a [employer]  Technique with a second layer of 3-0 silks completing the closure.   We then no inspected everything and a patent anastomosis was present. There didn't seem to be undue tension. Omentum was allowed to flop over the common closure area and everything went back in the abdomen without difficulty.  We removed the wound protector. Per protocol we changed our gowns and gloves. The posterior sheath was closed running 0 PDS. The abdomen was then reinsufflated and we surveyed the anastomosis from the endoscopic camera and also after having irrigated with 3 L of saline before closing. No bleeding was seen. Everything appeared to be in order and the anastomosis did not appear to have undue tension.  We perform this surgery with the benefit of the new [place]  Camera head.  Marcaine was injected in the incision which the second layer was closed with a running 0 PDS. Wounds irrigated and closed with stapler. The patient tolerated procedure well was taken recovery room in satisfactory condition.  [name], MD, [facility], [place] [phone]

## 2012-11-04 NOTE — Anesthesia Postprocedure Evaluation (Signed)
  Anesthesia Post-op Note  Patient: Mark Clayton  Procedure(s) Performed: Procedure(s) (LRB): LAPAROSCOPIC RIGHT HEMI COLECTOMY (N/A)  Patient Location: PACU  Anesthesia Type: General  Level of Consciousness: awake and alert   Airway and Oxygen Therapy: Patient Spontanous Breathing  Post-op Pain: mild  Post-op Assessment: Post-op Vital signs reviewed, Patient's Cardiovascular Status Stable, Respiratory Function Stable, Patent Airway and No signs of Nausea or vomiting  Last Vitals:  Filed Vitals:   11/04/12 1345  BP: 140/64  Pulse:   Temp:   Resp: 14    Post-op Vital Signs: stable   Complications: No apparent anesthesia complications

## 2012-11-04 NOTE — Anesthesia Preprocedure Evaluation (Signed)
Anesthesia Evaluation  Patient identified by MRN, date of birth, ID band Patient awake    Reviewed: Allergy & Precautions, H&P , NPO status , Patient's Chart, lab work & pertinent test results, reviewed documented beta blocker date and time   History of Anesthesia Complications Negative for: history of anesthetic complications  Airway Mallampati: II TM Distance: >3 FB Neck ROM: Full    Dental  (+) Missing and Chipped,    Pulmonary neg pulmonary ROS,  breath sounds clear to auscultation  Pulmonary exam normal       Cardiovascular hypertension, Pt. on medications + CAD, + Past MI and +CHF (EF 25%) + pacemaker + Cardiac Defibrillator     Neuro/Psych negative neurological ROS  negative psych ROS   GI/Hepatic Neg liver ROS, GERD-  Medicated,  Endo/Other  negative endocrine ROS  Renal/GU negative Renal ROS  negative genitourinary   Musculoskeletal negative musculoskeletal ROS (+)   Abdominal Normal abdominal exam  (+)   Peds negative pediatric ROS (+)  Hematology negative hematology ROS (+)   Anesthesia Other Findings   Reproductive/Obstetrics negative OB ROS                           Anesthesia Physical  Anesthesia Plan  ASA: III  Anesthesia Plan: General   Post-op Pain Management:    Induction: Intravenous  Airway Management Planned: Oral ETT  Additional Equipment:   Intra-op Plan:   Post-operative Plan:   Informed Consent: I have reviewed the patients History and Physical, chart, labs and discussed the procedure including the risks, benefits and alternatives for the proposed anesthesia with the patient or authorized representative who has indicated his/her understanding and acceptance.   Dental advisory given  Plan Discussed with: CRNA and Surgeon  Anesthesia Plan Comments:         Anesthesia Quick Evaluation

## 2012-11-05 DIAGNOSIS — I2589 Other forms of chronic ischemic heart disease: Secondary | ICD-10-CM

## 2012-11-05 DIAGNOSIS — I509 Heart failure, unspecified: Secondary | ICD-10-CM

## 2012-11-05 LAB — CBC
HCT: 34.4 % — ABNORMAL LOW (ref 39.0–52.0)
Hemoglobin: 11.1 g/dL — ABNORMAL LOW (ref 13.0–17.0)
MCH: 26.8 pg (ref 26.0–34.0)
MCHC: 32.3 g/dL (ref 30.0–36.0)
MCV: 83.1 fL (ref 78.0–100.0)
Platelets: 172 10*3/uL (ref 150–400)
RBC: 4.14 MIL/uL — ABNORMAL LOW (ref 4.22–5.81)
RDW: 14.8 % (ref 11.5–15.5)
WBC: 9.1 10*3/uL (ref 4.0–10.5)

## 2012-11-05 LAB — BASIC METABOLIC PANEL
BUN: 6 mg/dL (ref 6–23)
CO2: 27 mEq/L (ref 19–32)
Calcium: 8.8 mg/dL (ref 8.4–10.5)
Chloride: 98 mEq/L (ref 96–112)
Creatinine, Ser: 0.98 mg/dL (ref 0.50–1.35)
GFR calc Af Amer: >90 mL/min (ref >90)
GFR calc non Af Amer: 81 mL/min — ABNORMAL LOW (ref >90)
Glucose, Bld: 125 mg/dL — ABNORMAL HIGH (ref 70–99)
Potassium: 3.5 mEq/L (ref 3.5–5.1)
Sodium: 132 mEq/L — ABNORMAL LOW (ref 135–145)

## 2012-11-05 MED ORDER — PANTOPRAZOLE SODIUM 40 MG PO TBEC
40.0000 mg | DELAYED_RELEASE_TABLET | Freq: Every day | ORAL | Status: DC
  Administered 2012-11-05 – 2012-11-06 (×2): 40 mg via ORAL
  Filled 2012-11-05 (×4): qty 1

## 2012-11-05 NOTE — Progress Notes (Signed)
1 Day Post-Op  Subjective: Having incisional pain.  No nausea.  Has been OOB.  Objective: Vital signs in last 24 hours: Temp:  [97.4 F (36.3 C)-99.3 F (37.4 C)] 99.2 F (37.3 C) (08/09 0625) Pulse Rate:  [73-92] 88 (08/09 0625) Resp:  [12-20] 20 (08/09 0625) BP: (138-157)/(52-82) 138/77 mmHg (08/09 0625) SpO2:  [95 %-100 %] 96 % (08/09 0625) FiO2 (%):  [2 %] 2 % (08/08 1432) Weight:  [169 lb 4.8 oz (76.794 kg)] 169 lb 4.8 oz (76.794 kg) (08/08 1432) Last BM Date: 11/03/12  Intake/Output from previous day: 08/08 0701 - 08/09 0700 In: 2730 [I.V.:2730] Out: 2400 [Urine:2275; Blood:125] Intake/Output this shift:    PE: General- In NAD CV-RRR Lungs-clea Abdomen-soft, few bowel sounds, dressing dry  Lab Results:   Recent Labs  11/04/12 1502 11/05/12 0501  WBC 9.7 9.1  HGB 11.8* 11.1*  HCT 35.5* 34.4*  PLT 172 172   BMET  Recent Labs  11/04/12 1502 11/05/12 0501  NA  --  132*  K  --  3.5  CL  --  98  CO2  --  27  GLUCOSE  --  125*  BUN  --  6  CREATININE 0.97 0.98  CALCIUM  --  8.8   PT/INR No results found for this basename: LABPROT, INR,  in the last 72 hours Comprehensive Metabolic Panel:    Component Value Date/Time   NA 132* 11/05/2012 0501   K 3.5 11/05/2012 0501   CL 98 11/05/2012 0501   CO2 27 11/05/2012 0501   BUN 6 11/05/2012 0501   CREATININE 0.98 11/05/2012 0501   GLUCOSE 125* 11/05/2012 0501   CALCIUM 8.8 11/05/2012 0501   AST 19 09/06/2012 1245   ALT 13 09/06/2012 1245   ALKPHOS 42 09/06/2012 1245   BILITOT 1.1 09/06/2012 1245   PROT 6.5 09/06/2012 1245   ALBUMIN 3.5 09/06/2012 1245     Studies/Results: No results found.  Anti-infectives: Anti-infectives   Start     Dose/Rate Route Frequency Ordered Stop   11/04/12 1830  cefOXitin (MEFOXIN) 1 g in dextrose 5 % 50 mL IVPB     1 g 100 mL/hr over 30 Minutes Intravenous 4 times per day 11/04/12 1439 11/04/12 1854   11/04/12 1230  cefOXitin (MEFOXIN) 2 g in dextrose 5 % 50 mL IVPB  Status:   Discontinued     2 g 100 mL/hr over 30 Minutes Intravenous  Once 11/04/12 1319 11/04/12 1415   11/04/12 0735  cefOXitin (MEFOXIN) 2 g in dextrose 5 % 50 mL IVPB     2 g 100 mL/hr over 30 Minutes Intravenous On call to O.R. 11/04/12 0735 11/04/12 1230      Assessment Active Problems:  s/p right colectomy with large colon polyp 11/04/12-stable overnight  Ischemic cardiomyopathy and chronic CHF  with AICD in-no significant dysrhythmias    LOS: 1 day   Plan: Ambulate.  Clear liquids. Decrease IVF.   Mark Clayton J 11/05/2012

## 2012-11-06 LAB — BASIC METABOLIC PANEL
BUN: 4 mg/dL — ABNORMAL LOW (ref 6–23)
CO2: 28 mEq/L (ref 19–32)
Calcium: 8.6 mg/dL (ref 8.4–10.5)
Chloride: 92 mEq/L — ABNORMAL LOW (ref 96–112)
Creatinine, Ser: 0.87 mg/dL (ref 0.50–1.35)
GFR calc Af Amer: >90 mL/min (ref >90)
GFR calc non Af Amer: 85 mL/min — ABNORMAL LOW (ref >90)
Glucose, Bld: 158 mg/dL — ABNORMAL HIGH (ref 70–99)
Potassium: 3.9 mEq/L (ref 3.5–5.1)
Sodium: 124 mEq/L — ABNORMAL LOW (ref 135–145)

## 2012-11-06 LAB — CBC
HCT: 30.3 % — ABNORMAL LOW (ref 39.0–52.0)
Hemoglobin: 10.1 g/dL — ABNORMAL LOW (ref 13.0–17.0)
MCH: 27.5 pg (ref 26.0–34.0)
MCHC: 33.3 g/dL (ref 30.0–36.0)
MCV: 82.6 fL (ref 78.0–100.0)
Platelets: 161 10*3/uL (ref 150–400)
RBC: 3.67 MIL/uL — ABNORMAL LOW (ref 4.22–5.81)
RDW: 14.5 % (ref 11.5–15.5)
WBC: 7 10*3/uL (ref 4.0–10.5)

## 2012-11-06 MED ORDER — ONDANSETRON HCL 4 MG/2ML IJ SOLN
4.0000 mg | INTRAMUSCULAR | Status: DC | PRN
  Administered 2012-11-06 – 2012-11-07 (×6): 4 mg via INTRAVENOUS
  Filled 2012-11-06 (×6): qty 2

## 2012-11-06 MED ORDER — PHENOL 1.4 % MT LIQD
1.0000 | OROMUCOSAL | Status: DC | PRN
  Administered 2012-11-06: 1 via OROMUCOSAL
  Filled 2012-11-06: qty 177

## 2012-11-06 MED ORDER — MENTHOL 3 MG MT LOZG
1.0000 | LOZENGE | OROMUCOSAL | Status: DC | PRN
  Administered 2012-11-06: 3 mg via ORAL
  Filled 2012-11-06: qty 9

## 2012-11-06 NOTE — Progress Notes (Signed)
Pt c/o progressive worsening nausea and vomiting. Gave 4mg  of zofran but still vomiting. Got patient up and walked twice around floor. Pt stated that he felt better but then vomited again. Will page MD.

## 2012-11-06 NOTE — Progress Notes (Signed)
Placed NG tube after worsening N/V. immeditaly got 300 ml from suction. Pt states, "he is feeling much better".

## 2012-11-06 NOTE — Progress Notes (Signed)
2 Days Post-Op  Subjective:  Having nausea this AM.  Had a BM early this morning.  Pain in neck and shoulder.  Objective: Vital signs in last 24 hours: Temp:  [98.2 F (36.8 C)-99.2 F (37.3 C)] 98.2 F (36.8 C) (08/10 0454) Pulse Rate:  [61-75] 61 (08/10 0454) Resp:  [16-18] 18 (08/10 0454) BP: (123-135)/(51-67) 123/53 mmHg (08/10 0454) SpO2:  [94 %-98 %] 98 % (08/10 0454) Last BM Date: 11/06/12  Intake/Output from previous day: 08/09 0701 - 08/10 0700 In: 3720 [P.O.:2620; I.V.:1100] Out: 2175 [Urine:2175] Intake/Output this shift:    PE: General- In NAD Abdomen-soft, some distension today, hypoactive bowel sounds, dressing dry  Lab Results:   Recent Labs  11/05/12 0501 11/06/12 0413  WBC 9.1 7.0  HGB 11.1* 10.1*  HCT 34.4* 30.3*  PLT 172 161   BMET  Recent Labs  11/05/12 0501 11/06/12 0413  NA 132* 124*  K 3.5 3.9  CL 98 92*  CO2 27 28  GLUCOSE 125* 158*  BUN 6 4*  CREATININE 0.98 0.87  CALCIUM 8.8 8.6   PT/INR No results found for this basename: LABPROT, INR,  in the last 72 hours Comprehensive Metabolic Panel:    Component Value Date/Time   NA 124* 11/06/2012 0413   K 3.9 11/06/2012 0413   CL 92* 11/06/2012 0413   CO2 28 11/06/2012 0413   BUN 4* 11/06/2012 0413   CREATININE 0.87 11/06/2012 0413   GLUCOSE 158* 11/06/2012 0413   CALCIUM 8.6 11/06/2012 0413   AST 19 09/06/2012 1245   ALT 13 09/06/2012 1245   ALKPHOS 42 09/06/2012 1245   BILITOT 1.1 09/06/2012 1245   PROT 6.5 09/06/2012 1245   ALBUMIN 3.5 09/06/2012 1245     Studies/Results: No results found.  Anti-infectives: Anti-infectives   Start     Dose/Rate Route Frequency Ordered Stop   11/04/12 1830  cefOXitin (MEFOXIN) 1 g in dextrose 5 % 50 mL IVPB     1 g 100 mL/hr over 30 Minutes Intravenous 4 times per day 11/04/12 1439 11/04/12 1854   11/04/12 1230  cefOXitin (MEFOXIN) 2 g in dextrose 5 % 50 mL IVPB  Status:  Discontinued     2 g 100 mL/hr over 30 Minutes Intravenous  Once  11/04/12 1319 11/04/12 1415   11/04/12 0735  cefOXitin (MEFOXIN) 2 g in dextrose 5 % 50 mL IVPB     2 g 100 mL/hr over 30 Minutes Intravenous On call to O.R. 11/04/12 0735 11/04/12 1230      Assessment Active Problems:  s/p right colectomy with large colon polyp 11/04/12-developing some ileus  Ischemic cardiomyopathy and chronic CHF with AICD in-occasional PVCs on telemetry.    LOS: 2 days   Plan: Continue IVF as is.  Will not advance diet.   Kynsie Falkner J 11/06/2012

## 2012-11-06 NOTE — Progress Notes (Signed)
This morning pt c/o right neck and shoulder pain. He states that it only hurts when he moves around.

## 2012-11-07 ENCOUNTER — Encounter (HOSPITAL_COMMUNITY): Payer: Self-pay | Admitting: Surgery

## 2012-11-07 ENCOUNTER — Inpatient Hospital Stay (HOSPITAL_COMMUNITY): Payer: Medicare Other

## 2012-11-07 LAB — CBC WITH DIFFERENTIAL/PLATELET
Basophils Absolute: 0 10*3/uL (ref 0.0–0.1)
Basophils Relative: 0 % (ref 0–1)
Eosinophils Absolute: 0 10*3/uL (ref 0.0–0.7)
Eosinophils Relative: 0 % (ref 0–5)
HCT: 41 % (ref 39.0–52.0)
Hemoglobin: 13.8 g/dL (ref 13.0–17.0)
Lymphocytes Relative: 5 % — ABNORMAL LOW (ref 12–46)
Lymphs Abs: 0.7 10*3/uL (ref 0.7–4.0)
MCH: 27.1 pg (ref 26.0–34.0)
MCHC: 33.7 g/dL (ref 30.0–36.0)
MCV: 80.4 fL (ref 78.0–100.0)
Monocytes Absolute: 1.6 10*3/uL — ABNORMAL HIGH (ref 0.1–1.0)
Monocytes Relative: 11 % (ref 3–12)
Neutro Abs: 11.8 10*3/uL — ABNORMAL HIGH (ref 1.7–7.7)
Neutrophils Relative %: 84 % — ABNORMAL HIGH (ref 43–77)
Platelets: 228 10*3/uL (ref 150–400)
RBC: 5.1 MIL/uL (ref 4.22–5.81)
RDW: 14.4 % (ref 11.5–15.5)
WBC: 14.1 10*3/uL — ABNORMAL HIGH (ref 4.0–10.5)

## 2012-11-07 LAB — BASIC METABOLIC PANEL
BUN: 7 mg/dL (ref 6–23)
CO2: 26 mEq/L (ref 19–32)
Calcium: 8.7 mg/dL (ref 8.4–10.5)
Chloride: 90 mEq/L — ABNORMAL LOW (ref 96–112)
Creatinine, Ser: 0.78 mg/dL (ref 0.50–1.35)
GFR calc Af Amer: >90 mL/min (ref >90)
GFR calc non Af Amer: 89 mL/min — ABNORMAL LOW (ref >90)
Glucose, Bld: 180 mg/dL — ABNORMAL HIGH (ref 70–99)
Potassium: 4.1 mEq/L (ref 3.5–5.1)
Sodium: 124 mEq/L — ABNORMAL LOW (ref 135–145)

## 2012-11-07 MED ORDER — SODIUM CHLORIDE 0.9 % IV SOLN: INTRAVENOUS | Status: DC

## 2012-11-07 MED ORDER — HYDROMORPHONE HCL PF 1 MG/ML IJ SOLN
0.5000 mg | INTRAMUSCULAR | Status: DC | PRN
  Administered 2012-11-07 – 2012-11-14 (×20): 1 mg via INTRAVENOUS
  Filled 2012-11-07 (×19): qty 1

## 2012-11-07 MED ORDER — FUROSEMIDE 10 MG/ML IJ SOLN
20.0000 mg | Freq: Every day | INTRAMUSCULAR | Status: DC
  Administered 2012-11-07 – 2012-11-13 (×7): 20 mg via INTRAVENOUS
  Filled 2012-11-07 (×7): qty 2

## 2012-11-07 MED ORDER — METOPROLOL TARTRATE 1 MG/ML IV SOLN
5.0000 mg | Freq: Four times a day (QID) | INTRAVENOUS | Status: DC
  Administered 2012-11-07 – 2012-11-14 (×28): 5 mg via INTRAVENOUS
  Filled 2012-11-07 (×30): qty 5

## 2012-11-07 MED ORDER — METOCLOPRAMIDE HCL 5 MG/ML IJ SOLN: 5.0000 mg | Freq: Three times a day (TID) | INTRAMUSCULAR | Status: DC | PRN

## 2012-11-07 MED ORDER — METOCLOPRAMIDE HCL 5 MG/ML IJ SOLN
5.0000 mg | Freq: Three times a day (TID) | INTRAMUSCULAR | Status: AC
  Administered 2012-11-07 – 2012-11-10 (×9): 5 mg via INTRAVENOUS
  Filled 2012-11-07 (×3): qty 1
  Filled 2012-11-07: qty 2
  Filled 2012-11-07: qty 1
  Filled 2012-11-07: qty 2
  Filled 2012-11-07 (×2): qty 1
  Filled 2012-11-07 (×2): qty 2

## 2012-11-07 MED ORDER — PANTOPRAZOLE SODIUM 40 MG IV SOLR
40.0000 mg | INTRAVENOUS | Status: DC
  Administered 2012-11-07 – 2012-11-11 (×5): 40 mg via INTRAVENOUS
  Filled 2012-11-07 (×6): qty 40

## 2012-11-07 MED ORDER — POTASSIUM CHLORIDE 2 MEQ/ML IV SOLN
INTRAVENOUS | Status: DC
  Administered 2012-11-07 – 2012-11-13 (×11): via INTRAVENOUS
  Filled 2012-11-07 (×19): qty 1000

## 2012-11-07 MED ORDER — FUROSEMIDE 10 MG/ML IJ SOLN
INTRAMUSCULAR | Status: AC
  Filled 2012-11-07: qty 4

## 2012-11-07 MED ORDER — PROMETHAZINE HCL 25 MG/ML IJ SOLN
12.5000 mg | Freq: Four times a day (QID) | INTRAMUSCULAR | Status: DC | PRN
  Administered 2012-11-07 – 2012-11-13 (×2): 25 mg via INTRAVENOUS
  Filled 2012-11-07 (×2): qty 1

## 2012-11-07 MED ORDER — SODIUM CHLORIDE 0.9 % IV SOLN
INTRAVENOUS | Status: DC
  Filled 2012-11-07: qty 1000

## 2012-11-07 NOTE — Progress Notes (Signed)
Pt had occassional periods of nausea during the past 12 hours despite the NGT.   NGT tested many times and is working properly, placement checked and flushes well.   Has drained 1050 cc's since placed mostly green Archibald liquid but at times was pink.  Pt Abd distention has increased and only has BS in llq - is no longer passing flatus.   Pt has walked the halls x3 during past 24hours.  At 0349 had 3 beats of v-tach but was not his first time nor was he symptomatic.  Strip is in electronic chart.  Of note, his QT is .51 and QTC  Is .57 - these have slowly been getting higher.  Pt is NPO with NGT so he is not taking his coreg.  Please order if you want pt to take and clamp ngt for 30 minutes or if you want iv meds instead

## 2012-11-07 NOTE — Progress Notes (Signed)
Patient ID: Mark Clayton, male   DOB: 1941/02/14, 72 y.o.   MRN: [id number] [facility] Surgery Progress Note:   3 Days Post-Op  Subjective: Mental status is clear.  Complaining a lot of nausea.   Objective: Vital signs in last 24 hours: Temp:  [97.7 F (36.5 C)-98.3 F (36.8 C)] 97.9 F (36.6 C) (08/11 1300) Pulse Rate:  [73-82] 80 (08/11 1300) Resp:  [17-18] 17 (08/11 1300) BP: (129-155)/(70-88) 129/70 mmHg (08/11 1300) SpO2:  [96 %-98 %] 98 % (08/11 1300)  Intake/Output from previous day: 08/10 0701 - 08/11 0700 In: 1050 [P.O.:600; I.V.:300; NG/GT:150] Out: 2350 [Urine:1300; Emesis/NG output:1050] Intake/Output this shift: Total I/O In: -  Out: 400 [Urine:400]  Physical Exam: Work of breathing is not labored.  Distended.  Xray suggests ileus.    Lab Results:  Results for orders placed during the hospital encounter of 11/04/12 (from the past 48 hour(s))  CBC     Status: Abnormal   Collection Time    11/06/12  4:13 AM      Result Value Range   WBC 7.0  4.0 - 10.5 K/uL   RBC 3.67 (*) 4.22 - 5.81 MIL/uL   Hemoglobin 10.1 (*) 13.0 - 17.0 g/dL   HCT 11.9 (*) 14.7 - 82.9 %   MCV 82.6  78.0 - 100.0 fL   MCH 27.5  26.0 - 34.0 pg   MCHC 33.3  30.0 - 36.0 g/dL   RDW 56.2  13.0 - 86.5 %   Platelets 161  150 - 400 K/uL  BASIC METABOLIC PANEL     Status: Abnormal   Collection Time    11/06/12  4:13 AM      Result Value Range   Sodium 124 (*) 135 - 145 mEq/L   Comment: DELTA CHECK NOTED     REPEATED TO VERIFY   Potassium 3.9  3.5 - 5.1 mEq/L   Chloride 92 (*) 96 - 112 mEq/L   CO2 28  19 - 32 mEq/L   Glucose, Bld 158 (*) 70 - 99 mg/dL   BUN 4 (*) 6 - 23 mg/dL   Creatinine, Ser 7.84  0.50 - 1.35 mg/dL   Calcium 8.6  8.4 - 69.6 mg/dL   GFR calc non Af Amer 85 (*) >90 mL/min   GFR calc Af Amer >90  >90 mL/min   Comment:            The eGFR has been calculated     using the CKD EPI equation.     This calculation has not been     validated in all clinical      situations.     eGFR's persistently     <90 mL/min signify     possible Chronic Kidney Disease.  CBC WITH DIFFERENTIAL     Status: Abnormal   Collection Time    11/07/12  7:50 AM      Result Value Range   WBC 14.1 (*) 4.0 - 10.5 K/uL   RBC 5.10  4.22 - 5.81 MIL/uL   Hemoglobin 13.8  13.0 - 17.0 g/dL   Comment: REPEATED TO VERIFY     DELTA CHECK NOTED   HCT 41.0  39.0 - 52.0 %   MCV 80.4  78.0 - 100.0 fL   MCH 27.1  26.0 - 34.0 pg   MCHC 33.7  30.0 - 36.0 g/dL   RDW 29.5  28.4 - 13.2 %   Platelets 228  150 - 400 K/uL   Comment:  REPEATED TO VERIFY     DELTA CHECK NOTED   Neutrophils Relative % 84 (*) 43 - 77 %   Neutro Abs 11.8 (*) 1.7 - 7.7 K/uL   Lymphocytes Relative 5 (*) 12 - 46 %   Lymphs Abs 0.7  0.7 - 4.0 K/uL   Monocytes Relative 11  3 - 12 %   Monocytes Absolute 1.6 (*) 0.1 - 1.0 K/uL   Eosinophils Relative 0  0 - 5 %   Eosinophils Absolute 0.0  0.0 - 0.7 K/uL   Basophils Relative 0  0 - 1 %   Basophils Absolute 0.0  0.0 - 0.1 K/uL  BASIC METABOLIC PANEL     Status: Abnormal   Collection Time    11/07/12  7:50 AM      Result Value Range   Sodium 124 (*) 135 - 145 mEq/L   Potassium 4.1  3.5 - 5.1 mEq/L   Chloride 90 (*) 96 - 112 mEq/L   CO2 26  19 - 32 mEq/L   Glucose, Bld 180 (*) 70 - 99 mg/dL   BUN 7  6 - 23 mg/dL   Creatinine, Ser 1.61  0.50 - 1.35 mg/dL   Calcium 8.7  8.4 - 09.6 mg/dL   GFR calc non Af Amer 89 (*) >90 mL/min   GFR calc Af Amer >90  >90 mL/min   Comment:            The eGFR has been calculated     using the CKD EPI equation.     This calculation has not been     validated in all clinical     situations.     eGFR's persistently     <90 mL/min signify     possible Chronic Kidney Disease.    Radiology/Results: Dg Abd Acute W/chest  11/07/2012   *RADIOLOGY REPORT*  Clinical Data: Nausea and abdominal pain.  Recent right hemicolectomy.  ACUTE ABDOMEN SERIES (ABDOMEN 2 VIEW & CHEST 1 VIEW)  Comparison: Chest radiograph 10/28/2012   Findings: Chest radiograph demonstrates a left cardiac ICD. Densities at the left lung base may represent some volume loss. Median sternotomy wires are present.  The left lateral decubitus image demonstrates a small amount of free air.  Findings are likely related to the recent intra- abdominal surgery.  There is diffuse distention of small bowel loops throughout the abdomen and pelvis.  There is a nasogastric tube in the stomach region.  IMPRESSION: Diffuse small bowel distention in the abdomen and pelvis.  Findings are likely related to a postoperative ileus but an obstructive process cannot be excluded.  Small amount of pneumoperitoneum is most compatible with recent intra-abdominal surgery.  Left basilar atelectasis.   Original Report Authenticated By: Richarda Overlie, M.D.    Anti-infectives: Anti-infectives   Start     Dose/Rate Route Frequency Ordered Stop   11/04/12 1830  cefOXitin (MEFOXIN) 1 g in dextrose 5 % 50 mL IVPB     1 g 100 mL/hr over 30 Minutes Intravenous 4 times per day 11/04/12 1439 11/04/12 1854   11/04/12 1230  cefOXitin (MEFOXIN) 2 g in dextrose 5 % 50 mL IVPB  Status:  Discontinued     2 g 100 mL/hr over 30 Minutes Intravenous  Once 11/04/12 1319 11/04/12 1415   11/04/12 0735  cefOXitin (MEFOXIN) 2 g in dextrose 5 % 50 mL IVPB     2 g 100 mL/hr over 30 Minutes Intravenous On call to O.R. 11/04/12 0454 11/04/12 1230  Assessment/Plan: Problem List: Patient Active Problem List   Diagnosis Date Noted  . Adenocarcinoma of sigmoid colon Node positive 02/09/2011    Priority: High  . ICD -Medtronic 03/09/2012  . Colon polyps 08/31/2011  . Microcytic anemia 01/12/2011  . Chest pain 12/26/2010  . ischemic cardiomyopathy s/p CABG  12/26/2010  . Cardiac defibrillator  MDT VVI 12/26/2010  . LEG CRAMPS 05/10/2009  . DEHYDRATION 04/29/2009  . AORTIC STENOSIS, MILD 04/29/2009  . HYPERLIPIDEMIA, MILD 12/06/2008  . CONGESTIVE HEART FAILURE, SYSTOLIC, CHRONIC 11/26/2008     Hyponatremia-changedto NS Ileus-will add reglan to therapy. Patient is doing betterwith dilaudid than morphine 3 Days Post-Op    LOS: 3 days   [name], MD, [facility] [phone] beeper [phone]  11/07/2012 2:37 PM

## 2012-11-07 NOTE — Progress Notes (Signed)
Paged Dr. Daphine Deutscher to consult about changing patients medications to IV due to NG tube and persistant nausea; pt unable to be clamped off at this time due to abdominal discomfort and nausea; orders given and entered per MD; will continue to monitor patient

## 2012-11-08 LAB — BASIC METABOLIC PANEL
BUN: 10 mg/dL (ref 6–23)
CO2: 28 mEq/L (ref 19–32)
Calcium: 8.7 mg/dL (ref 8.4–10.5)
Chloride: 95 mEq/L — ABNORMAL LOW (ref 96–112)
Creatinine, Ser: 1.03 mg/dL (ref 0.50–1.35)
GFR calc Af Amer: 82 mL/min — ABNORMAL LOW (ref >90)
GFR calc non Af Amer: 71 mL/min — ABNORMAL LOW (ref >90)
Glucose, Bld: 141 mg/dL — ABNORMAL HIGH (ref 70–99)
Potassium: 4.2 mEq/L (ref 3.5–5.1)
Sodium: 126 mEq/L — ABNORMAL LOW (ref 135–145)

## 2012-11-08 LAB — CBC WITH DIFFERENTIAL/PLATELET
Basophils Absolute: 0 10*3/uL (ref 0.0–0.1)
Basophils Relative: 0 % (ref 0–1)
Eosinophils Absolute: 0.2 10*3/uL (ref 0.0–0.7)
Eosinophils Relative: 4 % (ref 0–5)
HCT: 35.7 % — ABNORMAL LOW (ref 39.0–52.0)
Hemoglobin: 11.9 g/dL — ABNORMAL LOW (ref 13.0–17.0)
Lymphocytes Relative: 16 % (ref 12–46)
Lymphs Abs: 0.9 10*3/uL (ref 0.7–4.0)
MCH: 27.1 pg (ref 26.0–34.0)
MCHC: 33.3 g/dL (ref 30.0–36.0)
MCV: 81.3 fL (ref 78.0–100.0)
Monocytes Absolute: 1.6 10*3/uL — ABNORMAL HIGH (ref 0.1–1.0)
Monocytes Relative: 30 % — ABNORMAL HIGH (ref 3–12)
Neutro Abs: 2.7 10*3/uL (ref 1.7–7.7)
Neutrophils Relative %: 51 % (ref 43–77)
Platelets: 220 10*3/uL (ref 150–400)
RBC: 4.39 MIL/uL (ref 4.22–5.81)
RDW: 14.8 % (ref 11.5–15.5)
WBC: 5.3 10*3/uL (ref 4.0–10.5)

## 2012-11-08 NOTE — Progress Notes (Signed)
Patient ID: Mark Clayton, male   DOB: [birth year], 72 y.o.   MRN: [id number] [facility] Surgery Progress Note:   4 Days Post-Op  Subjective: Mental status is clear.  Passed flatus during the night.  No abdominal pain this am except occasional cramps Objective: Vital signs in last 24 hours: Temp:  [97.9 F (36.6 C)-98.9 F (37.2 C)] 98.3 F (36.8 C) (08/12 0529) Pulse Rate:  [80-96] 86 (08/12 0529) Resp:  [17-18] 18 (08/12 0529) BP: (129-134)/(66-70) 134/67 mmHg (08/12 0529) SpO2:  [96 %-98 %] 96 % (08/12 0529) Weight:  [180 lb 12.4 oz (82 kg)] 180 lb 12.4 oz (82 kg) (08/12 0529)  Intake/Output from previous day: 08/11 0701 - 08/12 0700 In: 2036.7 [I.V.:1976.7; NG/GT:60] Out: 1050 [Urine:800; Emesis/NG output:250] Intake/Output this shift:    Physical Exam: Work of breathing is not labored.  Less discomfort.    Lab Results:  Results for orders placed during the hospital encounter of 11/04/12 (from the past 48 hour(s))  CBC WITH DIFFERENTIAL     Status: Abnormal   Collection Time    11/07/12  7:50 AM      Result Value Range   WBC 14.1 (*) 4.0 - 10.5 K/uL   RBC 5.10  4.22 - 5.81 MIL/uL   Hemoglobin 13.8  13.0 - 17.0 g/dL   Comment: REPEATED TO VERIFY     DELTA CHECK NOTED   HCT 41.0  39.0 - 52.0 %   MCV 80.4  78.0 - 100.0 fL   MCH 27.1  26.0 - 34.0 pg   MCHC 33.7  30.0 - 36.0 g/dL   RDW 78.2  95.6 - 21.3 %   Platelets 228  150 - 400 K/uL   Comment: REPEATED TO VERIFY     DELTA CHECK NOTED   Neutrophils Relative % 84 (*) 43 - 77 %   Neutro Abs 11.8 (*) 1.7 - 7.7 K/uL   Lymphocytes Relative 5 (*) 12 - 46 %   Lymphs Abs 0.7  0.7 - 4.0 K/uL   Monocytes Relative 11  3 - 12 %   Monocytes Absolute 1.6 (*) 0.1 - 1.0 K/uL   Eosinophils Relative 0  0 - 5 %   Eosinophils Absolute 0.0  0.0 - 0.7 K/uL   Basophils Relative 0  0 - 1 %   Basophils Absolute 0.0  0.0 - 0.1 K/uL  BASIC METABOLIC PANEL     Status: Abnormal   Collection Time    11/07/12  7:50 AM      Result  Value Range   Sodium 124 (*) 135 - 145 mEq/L   Potassium 4.1  3.5 - 5.1 mEq/L   Chloride 90 (*) 96 - 112 mEq/L   CO2 26  19 - 32 mEq/L   Glucose, Bld 180 (*) 70 - 99 mg/dL   BUN 7  6 - 23 mg/dL   Creatinine, Ser 0.86  0.50 - 1.35 mg/dL   Calcium 8.7  8.4 - 57.8 mg/dL   GFR calc non Af Amer 89 (*) >90 mL/min   GFR calc Af Amer >90  >90 mL/min   Comment:            The eGFR has been calculated     using the CKD EPI equation.     This calculation has not been     validated in all clinical     situations.     eGFR's persistently     <90 mL/min signify     possible  Chronic Kidney Disease.  CBC WITH DIFFERENTIAL     Status: Abnormal   Collection Time    11/08/12  4:07 AM      Result Value Range   WBC 5.3  4.0 - 10.5 K/uL   RBC 4.39  4.22 - 5.81 MIL/uL   Hemoglobin 11.9 (*) 13.0 - 17.0 g/dL   HCT 16.1 (*) 09.6 - 04.5 %   MCV 81.3  78.0 - 100.0 fL   MCH 27.1  26.0 - 34.0 pg   MCHC 33.3  30.0 - 36.0 g/dL   RDW 40.9  81.1 - 91.4 %   Platelets 220  150 - 400 K/uL   Neutrophils Relative % 51  43 - 77 %   Neutro Abs 2.7  1.7 - 7.7 K/uL   Lymphocytes Relative 16  12 - 46 %   Lymphs Abs 0.9  0.7 - 4.0 K/uL   Monocytes Relative 30 (*) 3 - 12 %   Monocytes Absolute 1.6 (*) 0.1 - 1.0 K/uL   Eosinophils Relative 4  0 - 5 %   Eosinophils Absolute 0.2  0.0 - 0.7 K/uL   Basophils Relative 0  0 - 1 %   Basophils Absolute 0.0  0.0 - 0.1 K/uL  BASIC METABOLIC PANEL     Status: Abnormal   Collection Time    11/08/12  4:07 AM      Result Value Range   Sodium 126 (*) 135 - 145 mEq/L   Potassium 4.2  3.5 - 5.1 mEq/L   Chloride 95 (*) 96 - 112 mEq/L   CO2 28  19 - 32 mEq/L   Glucose, Bld 141 (*) 70 - 99 mg/dL   BUN 10  6 - 23 mg/dL   Creatinine, Ser 7.82  0.50 - 1.35 mg/dL   Calcium 8.7  8.4 - 95.6 mg/dL   GFR calc non Af Amer 71 (*) >90 mL/min   GFR calc Af Amer 82 (*) >90 mL/min   Comment:            The eGFR has been calculated     using the CKD EPI equation.     This calculation has  not been     validated in all clinical     situations.     eGFR's persistently     <90 mL/min signify     possible Chronic Kidney Disease.    Radiology/Results: Dg Abd Acute W/chest  11/07/2012   *RADIOLOGY REPORT*  Clinical Data: Nausea and abdominal pain.  Recent right hemicolectomy.  ACUTE ABDOMEN SERIES (ABDOMEN 2 VIEW & CHEST 1 VIEW)  Comparison: Chest radiograph 10/28/2012  Findings: Chest radiograph demonstrates a left cardiac ICD. Densities at the left lung base may represent some volume loss. Median sternotomy wires are present.  The left lateral decubitus image demonstrates a small amount of free air.  Findings are likely related to the recent intra- abdominal surgery.  There is diffuse distention of small bowel loops throughout the abdomen and pelvis.  There is a nasogastric tube in the stomach region.  IMPRESSION: Diffuse small bowel distention in the abdomen and pelvis.  Findings are likely related to a postoperative ileus but an obstructive process cannot be excluded.  Small amount of pneumoperitoneum is most compatible with recent intra-abdominal surgery.  Left basilar atelectasis.   Original Report Authenticated By: Richarda Overlie, M.D.    Anti-infectives: Anti-infectives   Start     Dose/Rate Route Frequency Ordered Stop   11/04/12 1830  cefOXitin (MEFOXIN)  1 g in dextrose 5 % 50 mL IVPB     1 g 100 mL/hr over 30 Minutes Intravenous 4 times per day 11/04/12 1439 11/04/12 1854   11/04/12 1230  cefOXitin (MEFOXIN) 2 g in dextrose 5 % 50 mL IVPB  Status:  Discontinued     2 g 100 mL/hr over 30 Minutes Intravenous  Once 11/04/12 1319 11/04/12 1415   11/04/12 0735  cefOXitin (MEFOXIN) 2 g in dextrose 5 % 50 mL IVPB     2 g 100 mL/hr over 30 Minutes Intravenous On call to O.R. 11/04/12 0735 11/04/12 1230      Assessment/Plan: Problem List: Patient Active Problem List   Diagnosis Date Noted  . Adenocarcinoma of sigmoid colon Node positive 02/09/2011    Priority: High  . ICD  -Medtronic 03/09/2012  . Colon polyps 08/31/2011  . Microcytic anemia 01/12/2011  . Chest pain 12/26/2010  . ischemic cardiomyopathy s/p CABG  12/26/2010  . Cardiac defibrillator  MDT VVI 12/26/2010  . LEG CRAMPS 05/10/2009  . DEHYDRATION 04/29/2009  . AORTIC STENOSIS, MILD 04/29/2009  . HYPERLIPIDEMIA, MILD 12/06/2008  . CONGESTIVE HEART FAILURE, SYSTOLIC, CHRONIC 11/26/2008    WBC down.  Flatus.  ?response to Reglan.  Continue NG and observation 4 Days Post-Op    LOS: 4 days   [name], MD, [facility] [phone] beeper [phone]  11/08/2012 7:42 AM

## 2012-11-09 ENCOUNTER — Inpatient Hospital Stay (HOSPITAL_COMMUNITY): Payer: Medicare Other

## 2012-11-09 LAB — CBC WITH DIFFERENTIAL/PLATELET
Basophils Absolute: 0 10*3/uL (ref 0.0–0.1)
Basophils Relative: 1 % (ref 0–1)
Eosinophils Absolute: 0.2 10*3/uL (ref 0.0–0.7)
Eosinophils Relative: 4 % (ref 0–5)
HCT: 33.7 % — ABNORMAL LOW (ref 39.0–52.0)
Hemoglobin: 11.3 g/dL — ABNORMAL LOW (ref 13.0–17.0)
Lymphocytes Relative: 15 % (ref 12–46)
Lymphs Abs: 0.6 10*3/uL — ABNORMAL LOW (ref 0.7–4.0)
MCH: 27.7 pg (ref 26.0–34.0)
MCHC: 33.5 g/dL (ref 30.0–36.0)
MCV: 82.6 fL (ref 78.0–100.0)
Monocytes Absolute: 1.3 10*3/uL — ABNORMAL HIGH (ref 0.1–1.0)
Monocytes Relative: 32 % — ABNORMAL HIGH (ref 3–12)
Neutro Abs: 1.9 10*3/uL (ref 1.7–7.7)
Neutrophils Relative %: 49 % (ref 43–77)
Platelets: 135 10*3/uL — ABNORMAL LOW (ref 150–400)
RBC: 4.08 MIL/uL — ABNORMAL LOW (ref 4.22–5.81)
RDW: 15.1 % (ref 11.5–15.5)
WBC: 4 10*3/uL (ref 4.0–10.5)

## 2012-11-09 LAB — BASIC METABOLIC PANEL
BUN: 11 mg/dL (ref 6–23)
CO2: 27 mEq/L (ref 19–32)
Calcium: 8.7 mg/dL (ref 8.4–10.5)
Chloride: 98 mEq/L (ref 96–112)
Creatinine, Ser: 0.85 mg/dL (ref 0.50–1.35)
GFR calc Af Amer: >90 mL/min (ref >90)
GFR calc non Af Amer: 86 mL/min — ABNORMAL LOW (ref >90)
Glucose, Bld: 141 mg/dL — ABNORMAL HIGH (ref 70–99)
Potassium: 4.1 mEq/L (ref 3.5–5.1)
Sodium: 130 mEq/L — ABNORMAL LOW (ref 135–145)

## 2012-11-09 NOTE — Progress Notes (Signed)
Patient ID: Mark Clayton, male   DOB: [birth year], 72 y.o.   MRN: [id number] [facility] Progress Note:   5 Days Post-Op  Subjective: Mental status is clear.  Passed a lot of gas overnight Objective: Vital signs in last 24 hours: Temp:  [97.7 F (36.5 C)-99.4 F (37.4 C)] 98.2 F (36.8 C) (08/13 0438) Pulse Rate:  [97-109] 109 (08/13 0438) Resp:  [16-18] 18 (08/13 0438) BP: (134-155)/(69-80) 155/78 mmHg (08/13 0438) SpO2:  [94 %-100 %] 97 % (08/13 0438) Weight:  [181 lb (82.1 kg)] 181 lb (82.1 kg) (08/13 0438)  Intake/Output from previous day: 08/12 0701 - 08/13 0700 In: 1600 [I.V.:1600] Out: 1685 [Urine:925; Emesis/NG output:760] Intake/Output this shift:    Physical Exam: Work of breathing is not labored.  Abdomen is less distended.    Lab Results:  Results for orders placed during the hospital encounter of 11/04/12 (from the past 48 hour(s))  CBC WITH DIFFERENTIAL     Status: Abnormal   Collection Time    11/08/12  4:07 AM      Result Value Range   WBC 5.3  4.0 - 10.5 K/uL   RBC 4.39  4.22 - 5.81 MIL/uL   Hemoglobin 11.9 (*) 13.0 - 17.0 g/dL   HCT 72.5 (*) 36.6 - 44.0 %   MCV 81.3  78.0 - 100.0 fL   MCH 27.1  26.0 - 34.0 pg   MCHC 33.3  30.0 - 36.0 g/dL   RDW 34.7  42.5 - 95.6 %   Platelets 220  150 - 400 K/uL   Neutrophils Relative % 51  43 - 77 %   Neutro Abs 2.7  1.7 - 7.7 K/uL   Lymphocytes Relative 16  12 - 46 %   Lymphs Abs 0.9  0.7 - 4.0 K/uL   Monocytes Relative 30 (*) 3 - 12 %   Monocytes Absolute 1.6 (*) 0.1 - 1.0 K/uL   Eosinophils Relative 4  0 - 5 %   Eosinophils Absolute 0.2  0.0 - 0.7 K/uL   Basophils Relative 0  0 - 1 %   Basophils Absolute 0.0  0.0 - 0.1 K/uL  BASIC METABOLIC PANEL     Status: Abnormal   Collection Time    11/08/12  4:07 AM      Result Value Range   Sodium 126 (*) 135 - 145 mEq/L   Potassium 4.2  3.5 - 5.1 mEq/L   Chloride 95 (*) 96 - 112 mEq/L   CO2 28  19 - 32 mEq/L   Glucose, Bld 141 (*) 70 - 99 mg/dL   BUN  10  6 - 23 mg/dL   Creatinine, Ser 3.87  0.50 - 1.35 mg/dL   Calcium 8.7  8.4 - 56.4 mg/dL   GFR calc non Af Amer 71 (*) >90 mL/min   GFR calc Af Amer 82 (*) >90 mL/min   Comment:            The eGFR has been calculated     using the CKD EPI equation.     This calculation has not been     validated in all clinical     situations.     eGFR's persistently     <90 mL/min signify     possible Chronic Kidney Disease.    Radiology/Results: Dg Abd Acute W/chest  11/07/2012   *RADIOLOGY REPORT*  Clinical Data: Nausea and abdominal pain.  Recent right hemicolectomy.  ACUTE ABDOMEN SERIES (ABDOMEN 2 VIEW & CHEST  1 VIEW)  Comparison: Chest radiograph 10/28/2012  Findings: Chest radiograph demonstrates a left cardiac ICD. Densities at the left lung base may represent some volume loss. Median sternotomy wires are present.  The left lateral decubitus image demonstrates a small amount of free air.  Findings are likely related to the recent intra- abdominal surgery.  There is diffuse distention of small bowel loops throughout the abdomen and pelvis.  There is a nasogastric tube in the stomach region.  IMPRESSION: Diffuse small bowel distention in the abdomen and pelvis.  Findings are likely related to a postoperative ileus but an obstructive process cannot be excluded.  Small amount of pneumoperitoneum is most compatible with recent intra-abdominal surgery.  Left basilar atelectasis.   Original Report Authenticated By: Richarda Overlie, M.D.    Anti-infectives: Anti-infectives   Start     Dose/Rate Route Frequency Ordered Stop   11/04/12 1830  cefOXitin (MEFOXIN) 1 g in dextrose 5 % 50 mL IVPB     1 g 100 mL/hr over 30 Minutes Intravenous 4 times per day 11/04/12 1439 11/04/12 1854   11/04/12 1230  cefOXitin (MEFOXIN) 2 g in dextrose 5 % 50 mL IVPB  Status:  Discontinued     2 g 100 mL/hr over 30 Minutes Intravenous  Once 11/04/12 1319 11/04/12 1415   11/04/12 0735  cefOXitin (MEFOXIN) 2 g in dextrose 5 % 50  mL IVPB     2 g 100 mL/hr over 30 Minutes Intravenous On call to O.R. 11/04/12 0735 11/04/12 1230      Assessment/Plan: Problem List: Patient Active Problem List   Diagnosis Date Noted  . Adenocarcinoma of sigmoid colon Node positive 02/09/2011    Priority: High  . ICD -Medtronic 03/09/2012  . Colon polyps 08/31/2011  . Microcytic anemia 01/12/2011  . Chest pain 12/26/2010  . ischemic cardiomyopathy s/p CABG  12/26/2010  . Cardiac defibrillator  MDT VVI 12/26/2010  . LEG CRAMPS 05/10/2009  . DEHYDRATION 04/29/2009  . AORTIC STENOSIS, MILD 04/29/2009  . HYPERLIPIDEMIA, MILD 12/06/2008  . CONGESTIVE HEART FAILURE, SYSTOLIC, CHRONIC 11/26/2008    Will check xray and lab today.  NG in for now.   5 Days Post-Op    LOS: 5 days   [name], MD, [facility] [phone] beeper [phone]  11/09/2012 8:33 AM

## 2012-11-10 LAB — CLOSTRIDIUM DIFFICILE BY PCR: Toxigenic C. Difficile by PCR: NEGATIVE

## 2012-11-10 NOTE — Progress Notes (Signed)
Patient ID: Mark Clayton, male   DOB: Aug 30, 1940, 72 y.o.   MRN: [id number] [facility] Surgery Progress Note:   6 Days Post-Op  Subjective: Mental status is clear.  Having diarrhea.  C dif in process Objective: Vital signs in last 24 hours: Temp:  [97.9 F (36.6 C)-100.4 F (38 C)] 98.2 F (36.8 C) (08/14 0418) Pulse Rate:  [94-104] 95 (08/14 0418) Resp:  [16-18] 18 (08/14 0418) BP: (140-147)/(66-82) 147/69 mmHg (08/14 0418) SpO2:  [95 %-98 %] 98 % (08/14 0418) Weight:  [175 lb 11.3 oz (79.7 kg)] 175 lb 11.3 oz (79.7 kg) (08/14 0300)  Intake/Output from previous day: 08/13 0701 - 08/14 0700 In: 3100 [I.V.:3100] Out: 2425 [Urine:975; Emesis/NG output:1450] Intake/Output this shift:    Physical Exam: Work of breathing is normal.  Abdomen is soft.  Nontender.   Lab Results:  Results for orders placed during the hospital encounter of 11/04/12 (from the past 48 hour(s))  CBC WITH DIFFERENTIAL     Status: Abnormal   Collection Time    11/09/12  8:47 AM      Result Value Range   WBC 4.0  4.0 - 10.5 K/uL   RBC 4.08 (*) 4.22 - 5.81 MIL/uL   Hemoglobin 11.3 (*) 13.0 - 17.0 g/dL   HCT 40.1 (*) 02.7 - 25.3 %   MCV 82.6  78.0 - 100.0 fL   MCH 27.7  26.0 - 34.0 pg   MCHC 33.5  30.0 - 36.0 g/dL   RDW 66.4  40.3 - 47.4 %   Platelets 135 (*) 150 - 400 K/uL   Comment: REPEATED TO VERIFY     DELTA CHECK NOTED   Neutrophils Relative % 49  43 - 77 %   Neutro Abs 1.9  1.7 - 7.7 K/uL   Lymphocytes Relative 15  12 - 46 %   Lymphs Abs 0.6 (*) 0.7 - 4.0 K/uL   Monocytes Relative 32 (*) 3 - 12 %   Monocytes Absolute 1.3 (*) 0.1 - 1.0 K/uL   Eosinophils Relative 4  0 - 5 %   Eosinophils Absolute 0.2  0.0 - 0.7 K/uL   Basophils Relative 1  0 - 1 %   Basophils Absolute 0.0  0.0 - 0.1 K/uL  BASIC METABOLIC PANEL     Status: Abnormal   Collection Time    11/09/12  8:47 AM      Result Value Range   Sodium 130 (*) 135 - 145 mEq/L   Potassium 4.1  3.5 - 5.1 mEq/L   Chloride 98  96 - 112  mEq/L   CO2 27  19 - 32 mEq/L   Glucose, Bld 141 (*) 70 - 99 mg/dL   BUN 11  6 - 23 mg/dL   Creatinine, Ser 2.59  0.50 - 1.35 mg/dL   Calcium 8.7  8.4 - 56.3 mg/dL   GFR calc non Af Amer 86 (*) >90 mL/min   GFR calc Af Amer >90  >90 mL/min   Comment:            The eGFR has been calculated     using the CKD EPI equation.     This calculation has not been     validated in all clinical     situations.     eGFR's persistently     <90 mL/min signify     possible Chronic Kidney Disease.    Radiology/Results: Dg Abd Acute W/chest  11/09/2012   *RADIOLOGY REPORT*  Clinical Data:  Distended abdomen.  Previous partial colectomy.  ACUTE ABDOMEN SERIES (ABDOMEN 2 VIEW & CHEST 1 VIEW)  Comparison: 11/07/2012.  10/28/2012.  Findings: Nasogastric tube has its tip in the gastric fundus.  No free air is seen.  There are dilated fluid and air filled loops of small intestine most consistent with partial small bowel obstruction.  The appearance could be seen with ileus.  IMPRESSION: Dilated fluid or air filled loops of small intestine most consistent with partial small bowel obstruction.  No free air. Nasogastric tube tip in the gastric fundus.   Original Report Authenticated By: Paulina Fusi, M.D.    Anti-infectives: Anti-infectives   Start     Dose/Rate Route Frequency Ordered Stop   11/04/12 1830  cefOXitin (MEFOXIN) 1 g in dextrose 5 % 50 mL IVPB     1 g 100 mL/hr over 30 Minutes Intravenous 4 times per day 11/04/12 1439 11/04/12 1854   11/04/12 1230  cefOXitin (MEFOXIN) 2 g in dextrose 5 % 50 mL IVPB  Status:  Discontinued     2 g 100 mL/hr over 30 Minutes Intravenous  Once 11/04/12 1319 11/04/12 1415   11/04/12 0735  cefOXitin (MEFOXIN) 2 g in dextrose 5 % 50 mL IVPB     2 g 100 mL/hr over 30 Minutes Intravenous On call to O.R. 11/04/12 0735 11/04/12 1230      Assessment/Plan: Problem List: Patient Active Problem List   Diagnosis Date Noted  . Adenocarcinoma of sigmoid colon Node positive  02/09/2011    Priority: High  . ICD -Medtronic 03/09/2012  . Colon polyps 08/31/2011  . Microcytic anemia 01/12/2011  . Chest pain 12/26/2010  . ischemic cardiomyopathy s/p CABG  12/26/2010  . Cardiac defibrillator  MDT VVI 12/26/2010  . LEG CRAMPS 05/10/2009  . DEHYDRATION 04/29/2009  . AORTIC STENOSIS, MILD 04/29/2009  . HYPERLIPIDEMIA, MILD 12/06/2008  . CONGESTIVE HEART FAILURE, SYSTOLIC, CHRONIC 11/26/2008    Will discontinue NG and start clear liquids 6 Days Post-Op    LOS: 6 days   [name], MD, [facility] [phone] beeper [phone]  11/10/2012 8:25 AM

## 2012-11-10 NOTE — Progress Notes (Signed)
Last night, patient spiked a temp of 100.4 Ice packs were applied under the arms and patient was asked to use the IS. Temperature was rechecked later and it came down to normal.

## 2012-11-11 NOTE — Progress Notes (Signed)
Patient ID: Mark Clayton, male   DOB: May 21, 1940, 72 y.o.   MRN: [id number] [facility] Surgery Progress Note:   7 Days Post-Op  Subjective: Mental status is clear.  Feeling good.  No abdominal complaints.  Loose BMs this am.   Objective: Vital signs in last 24 hours: Temp:  [97.6 F (36.4 C)-98.7 F (37.1 C)] 98.7 F (37.1 C) (08/15 0542) Pulse Rate:  [87-93] 89 (08/15 0542) Resp:  [17-20] 20 (08/14 2133) BP: (139-145)/(68-70) 145/69 mmHg (08/15 0542) SpO2:  [97 %-99 %] 97 % (08/15 0542) Weight:  [174 lb 6.1 oz (79.1 kg)] 174 lb 6.1 oz (79.1 kg) (08/15 0542)  Intake/Output from previous day: 08/14 0701 - 08/15 0700 In: 2500 [I.V.:2500] Out: 700 [Urine:200; Emesis/NG output:500] Intake/Output this shift:    Physical Exam: Work of breathing is not labored.  Abdomen is soft.    Lab Results:  Results for orders placed during the hospital encounter of 11/04/12 (from the past 48 hour(s))  CBC WITH DIFFERENTIAL     Status: Abnormal   Collection Time    11/09/12  8:47 AM      Result Value Range   WBC 4.0  4.0 - 10.5 K/uL   RBC 4.08 (*) 4.22 - 5.81 MIL/uL   Hemoglobin 11.3 (*) 13.0 - 17.0 g/dL   HCT 95.2 (*) 84.1 - 32.4 %   MCV 82.6  78.0 - 100.0 fL   MCH 27.7  26.0 - 34.0 pg   MCHC 33.5  30.0 - 36.0 g/dL   RDW 40.1  02.7 - 25.3 %   Platelets 135 (*) 150 - 400 K/uL   Comment: REPEATED TO VERIFY     DELTA CHECK NOTED   Neutrophils Relative % 49  43 - 77 %   Neutro Abs 1.9  1.7 - 7.7 K/uL   Lymphocytes Relative 15  12 - 46 %   Lymphs Abs 0.6 (*) 0.7 - 4.0 K/uL   Monocytes Relative 32 (*) 3 - 12 %   Monocytes Absolute 1.3 (*) 0.1 - 1.0 K/uL   Eosinophils Relative 4  0 - 5 %   Eosinophils Absolute 0.2  0.0 - 0.7 K/uL   Basophils Relative 1  0 - 1 %   Basophils Absolute 0.0  0.0 - 0.1 K/uL  BASIC METABOLIC PANEL     Status: Abnormal   Collection Time    11/09/12  8:47 AM      Result Value Range   Sodium 130 (*) 135 - 145 mEq/L   Potassium 4.1  3.5 - 5.1 mEq/L   Chloride 98  96 - 112 mEq/L   CO2 27  19 - 32 mEq/L   Glucose, Bld 141 (*) 70 - 99 mg/dL   BUN 11  6 - 23 mg/dL   Creatinine, Ser 6.64  0.50 - 1.35 mg/dL   Calcium 8.7  8.4 - 40.3 mg/dL   GFR calc non Af Amer 86 (*) >90 mL/min   GFR calc Af Amer >90  >90 mL/min   Comment:            The eGFR has been calculated     using the CKD EPI equation.     This calculation has not been     validated in all clinical     situations.     eGFR's persistently     <90 mL/min signify     possible Chronic Kidney Disease.  CLOSTRIDIUM DIFFICILE BY PCR     Status: None  Collection Time    11/09/12  6:42 PM      Result Value Range   C difficile by pcr NEGATIVE  NEGATIVE   Comment: Performed at [facility]    Radiology/Results: Dg Abd Acute W/chest  11/09/2012   *RADIOLOGY REPORT*  Clinical Data: Distended abdomen.  Previous partial colectomy.  ACUTE ABDOMEN SERIES (ABDOMEN 2 VIEW & CHEST 1 VIEW)  Comparison: 11/07/2012.  10/28/2012.  Findings: Nasogastric tube has its tip in the gastric fundus.  No free air is seen.  There are dilated fluid and air filled loops of small intestine most consistent with partial small bowel obstruction.  The appearance could be seen with ileus.  IMPRESSION: Dilated fluid or air filled loops of small intestine most consistent with partial small bowel obstruction.  No free air. Nasogastric tube tip in the gastric fundus.   Original Report Authenticated By: Paulina Fusi, M.D.    Anti-infectives: Anti-infectives   Start     Dose/Rate Route Frequency Ordered Stop   11/04/12 1830  cefOXitin (MEFOXIN) 1 g in dextrose 5 % 50 mL IVPB     1 g 100 mL/hr over 30 Minutes Intravenous 4 times per day 11/04/12 1439 11/04/12 1854   11/04/12 1230  cefOXitin (MEFOXIN) 2 g in dextrose 5 % 50 mL IVPB  Status:  Discontinued     2 g 100 mL/hr over 30 Minutes Intravenous  Once 11/04/12 1319 11/04/12 1415   11/04/12 0735  cefOXitin (MEFOXIN) 2 g in dextrose 5 % 50 mL IVPB     2 g 100  mL/hr over 30 Minutes Intravenous On call to O.R. 11/04/12 0735 11/04/12 1230      Assessment/Plan: Problem List: Patient Active Problem List   Diagnosis Date Noted  . Adenocarcinoma of sigmoid colon Node positive 02/09/2011    Priority: High  . ICD -Medtronic 03/09/2012  . Colon polyps 08/31/2011  . Microcytic anemia 01/12/2011  . Chest pain 12/26/2010  . ischemic cardiomyopathy s/p CABG  12/26/2010  . Cardiac defibrillator  MDT VVI 12/26/2010  . LEG CRAMPS 05/10/2009  . DEHYDRATION 04/29/2009  . AORTIC STENOSIS, MILD 04/29/2009  . HYPERLIPIDEMIA, MILD 12/06/2008  . CONGESTIVE HEART FAILURE, SYSTOLIC, CHRONIC 11/26/2008    Taking clears well.  Diarrhea may be from shorter colon.  Will advance to full liquids.  C dif was negative 7 Days Post-Op    LOS: 7 days   [name], MD, [facility] [phone] beeper [phone]  11/11/2012 7:25 AM

## 2012-11-12 LAB — CBC WITH DIFFERENTIAL/PLATELET
Basophils Absolute: 0 10*3/uL (ref 0.0–0.1)
Basophils Relative: 0 % (ref 0–1)
Eosinophils Absolute: 0.2 10*3/uL (ref 0.0–0.7)
Eosinophils Relative: 4 % (ref 0–5)
HCT: 31.3 % — ABNORMAL LOW (ref 39.0–52.0)
Hemoglobin: 10.1 g/dL — ABNORMAL LOW (ref 13.0–17.0)
Lymphocytes Relative: 14 % (ref 12–46)
Lymphs Abs: 0.8 10*3/uL (ref 0.7–4.0)
MCH: 26.5 pg (ref 26.0–34.0)
MCHC: 32.3 g/dL (ref 30.0–36.0)
MCV: 82.2 fL (ref 78.0–100.0)
Monocytes Absolute: 1.2 10*3/uL — ABNORMAL HIGH (ref 0.1–1.0)
Monocytes Relative: 20 % — ABNORMAL HIGH (ref 3–12)
Neutro Abs: 3.7 10*3/uL (ref 1.7–7.7)
Neutrophils Relative %: 62 % (ref 43–77)
Platelets: 168 10*3/uL (ref 150–400)
RBC: 3.81 MIL/uL — ABNORMAL LOW (ref 4.22–5.81)
RDW: 15.1 % (ref 11.5–15.5)
WBC: 6.1 10*3/uL (ref 4.0–10.5)

## 2012-11-12 LAB — BASIC METABOLIC PANEL
BUN: 3 mg/dL — ABNORMAL LOW (ref 6–23)
CO2: 22 mEq/L (ref 19–32)
Calcium: 8.7 mg/dL (ref 8.4–10.5)
Chloride: 100 mEq/L (ref 96–112)
Creatinine, Ser: 0.71 mg/dL (ref 0.50–1.35)
GFR calc Af Amer: >90 mL/min (ref >90)
GFR calc non Af Amer: >90 mL/min (ref >90)
Glucose, Bld: 144 mg/dL — ABNORMAL HIGH (ref 70–99)
Potassium: 3.2 mEq/L — ABNORMAL LOW (ref 3.5–5.1)
Sodium: 131 mEq/L — ABNORMAL LOW (ref 135–145)

## 2012-11-12 MED ORDER — PANTOPRAZOLE SODIUM 40 MG PO TBEC
40.0000 mg | DELAYED_RELEASE_TABLET | Freq: Every day | ORAL | Status: DC
  Administered 2012-11-12 – 2012-11-13 (×2): 40 mg via ORAL
  Filled 2012-11-12 (×3): qty 1

## 2012-11-12 NOTE — Progress Notes (Signed)
General Surgery Note  LOS: 8 days  POD -   8 Days Post-Op Room - 1426  Assessment/Plan: 1. LAPAROSCOPIC RIGHT HEMI COLECTOMY - 11/04/2012 - M. Martin  Diaphoretic last night, better this AM.  Cause unclear.  Will check CBC tomorrow.  Will leave on full liquids for now.   2.  History of adenoca of sigmoid colon - 01/2011 3.  History of ischemic cardiomyopathy  S/p CABG 4.  Cardiac defibrillator  Sees Dr. Clide Cliff 5.  History of CHF 6. DVT prophylaxis - SQ heparin  Subjective:  Diaphoretic last night, better this AM.  Tolerating the liquids, though got bloated yesterday.  Had BM and flatus this AM Objective:   Filed Vitals:   11/12/12 0604  BP: 136/69  Pulse: 85  Temp: 97.8 F (36.6 C)  Resp: 20     Intake/Output from previous day:  08/15 0701 - 08/16 0700 In: 3120 [P.O.:720; I.V.:2400] Out: 2275 [Urine:2275]  Intake/Output this shift:      Physical Exam:   General: [name] who is alert and oriented.    HEENT: Normal. Pupils equal. .   Lungs: Clear   Abdomen: mildly distended.  Decreased BS.   Wound: looks okay   Lab Results:    Recent Labs  11/12/12 0530  WBC 6.1  HGB 10.1*  HCT 31.3*  PLT 168    BMET   Recent Labs  11/12/12 0530  NA 131*  K 3.2*  CL 100  CO2 22  GLUCOSE 144*  BUN 3*  CREATININE 0.71  CALCIUM 8.7    PT/INR  No results found for this basename: LABPROT, INR,  in the last 72 hours  ABG  No results found for this basename: PHART, PCO2, PO2, HCO3,  in the last 72 hours   Studies/Results:  No results found.   Anti-infectives:   Anti-infectives   Start     Dose/Rate Route Frequency Ordered Stop   11/04/12 1830  cefOXitin (MEFOXIN) 1 g in dextrose 5 % 50 mL IVPB     1 g 100 mL/hr over 30 Minutes Intravenous 4 times per day 11/04/12 1439 11/04/12 1854   11/04/12 1230  cefOXitin (MEFOXIN) 2 g in dextrose 5 % 50 mL IVPB  Status:  Discontinued     2 g 100 mL/hr over 30 Minutes Intravenous  Once 11/04/12 1319 11/04/12 1415   11/04/12 0735  cefOXitin (MEFOXIN) 2 g in dextrose 5 % 50 mL IVPB     2 g 100 mL/hr over 30 Minutes Intravenous On call to O.R. 11/04/12 0735 11/04/12 1230      [name], MD, FACS Pager: [phone],   [facility] Office: [phone] 11/12/2012

## 2012-11-13 LAB — CBC WITH DIFFERENTIAL/PLATELET
Basophils Absolute: 0 10*3/uL (ref 0.0–0.1)
Basophils Relative: 1 % (ref 0–1)
Eosinophils Absolute: 0.3 10*3/uL (ref 0.0–0.7)
Eosinophils Relative: 4 % (ref 0–5)
HCT: 31.4 % — ABNORMAL LOW (ref 39.0–52.0)
Hemoglobin: 10.3 g/dL — ABNORMAL LOW (ref 13.0–17.0)
Lymphocytes Relative: 19 % (ref 12–46)
Lymphs Abs: 1.5 10*3/uL (ref 0.7–4.0)
MCH: 27.1 pg (ref 26.0–34.0)
MCHC: 32.8 g/dL (ref 30.0–36.0)
MCV: 82.6 fL (ref 78.0–100.0)
Monocytes Absolute: 1.5 10*3/uL — ABNORMAL HIGH (ref 0.1–1.0)
Monocytes Relative: 19 % — ABNORMAL HIGH (ref 3–12)
Neutro Abs: 4.5 10*3/uL (ref 1.7–7.7)
Neutrophils Relative %: 58 % (ref 43–77)
Platelets: 188 10*3/uL (ref 150–400)
RBC: 3.8 MIL/uL — ABNORMAL LOW (ref 4.22–5.81)
RDW: 15.2 % (ref 11.5–15.5)
WBC: 7.8 10*3/uL (ref 4.0–10.5)

## 2012-11-13 NOTE — Progress Notes (Signed)
General Surgery Note  LOS: 9 days  POD -   9 Days Post-Op Room - 1426  Assessment/Plan: 1. LAPAROSCOPIC RIGHT HEMI COLECTOMY - 11/04/2012 - M. Martin  Diaphoretic again last night, better this AM.  Cause unclear.  WBC is normal and patient had not been febrile.   Will advance to reg diet   2.  History of adenoca of sigmoid colon - 01/2011 3.  History of ischemic cardiomyopathy  S/p CABG  Had small run of PVC's last PM.  Asymptomatic. 4.  Cardiac defibrillator  Sees Dr. Clide Cliff 5.  History of CHF 6.  DVT prophylaxis - SQ heparin  Subjective:  Diaphoretic again last night, better this AM.  Tolerating the liquids and having multiple loose stools.  No abdominal pain. The nurse documented "vomiting" last PM, but the patient said he spit up something when he was coughing and was not nauseated.  Objective:   Filed Vitals:   11/13/12 0552  BP: 147/73  Pulse: 98  Temp: 98.5 F (36.9 C)  Resp: 20     Intake/Output from previous day:  08/16 0701 - 08/17 0700 In: 3200 [P.O.:1200; I.V.:2000] Out: 1405 [Urine:1375; Emesis/NG output:30]  Intake/Output this shift:      Physical Exam:   General: [name] who is alert and oriented.    HEENT: Normal. Pupils equal. .   Lungs: Clear   Abdomen: Mildly distended.  Decreased BS.  About the same as yesterday.   Wound: looks okay   Lab Results:     Recent Labs  11/12/12 0530 11/13/12 0432  WBC 6.1 7.8  HGB 10.1* 10.3*  HCT 31.3* 31.4*  PLT 168 188    BMET    Recent Labs  11/12/12 0530  NA 131*  K 3.2*  CL 100  CO2 22  GLUCOSE 144*  BUN 3*  CREATININE 0.71  CALCIUM 8.7    PT/INR  No results found for this basename: LABPROT, INR,  in the last 72 hours  ABG  No results found for this basename: PHART, PCO2, PO2, HCO3,  in the last 72 hours   Studies/Results:  No results found.   Anti-infectives:   Anti-infectives   Start     Dose/Rate Route Frequency Ordered Stop   11/04/12 1830  cefOXitin (MEFOXIN) 1 g in  dextrose 5 % 50 mL IVPB     1 g 100 mL/hr over 30 Minutes Intravenous 4 times per day 11/04/12 1439 11/04/12 1854   11/04/12 1230  cefOXitin (MEFOXIN) 2 g in dextrose 5 % 50 mL IVPB  Status:  Discontinued     2 g 100 mL/hr over 30 Minutes Intravenous  Once 11/04/12 1319 11/04/12 1415   11/04/12 0735  cefOXitin (MEFOXIN) 2 g in dextrose 5 % 50 mL IVPB     2 g 100 mL/hr over 30 Minutes Intravenous On call to O.R. 11/04/12 0735 11/04/12 1230      [name], MD, FACS Pager: [phone],   [facility] Office: [phone] 11/13/2012

## 2012-11-13 NOTE — Progress Notes (Signed)
Pt has had 2 mostly liquid greenish Schroeter stools in past 8 hours.  Has vomited 30 cc's green liquid, has had 3 episodes of dry heaving, passing gas and belching.  Abd remains distended, firm c some hypo BS

## 2012-11-13 NOTE — Progress Notes (Signed)
Pt with 13 beats run of svt. Asymptomatic, lying in bed with visitors at bedside talking to patient. Vitals: 97.9, 94, 154/79, 97% ra. MD made aware. Said to monitor pt and report if rhythm persists. Vwilliams,rn.

## 2012-11-13 NOTE — Progress Notes (Signed)
occassional small runs of PVC's, not a new occurrence, pt is asymptomatic and returns to sr afterwards.  Will continue to monitor

## 2012-11-14 DIAGNOSIS — Z9049 Acquired absence of other specified parts of digestive tract: Secondary | ICD-10-CM

## 2012-11-14 LAB — BASIC METABOLIC PANEL
BUN: <3 mg/dL — ABNORMAL LOW (ref 6–23)
CO2: 24 mEq/L (ref 19–32)
Calcium: 9.1 mg/dL (ref 8.4–10.5)
Chloride: 102 mEq/L (ref 96–112)
Creatinine, Ser: 0.79 mg/dL (ref 0.50–1.35)
GFR calc Af Amer: >90 mL/min (ref >90)
GFR calc non Af Amer: 88 mL/min — ABNORMAL LOW (ref >90)
Glucose, Bld: 115 mg/dL — ABNORMAL HIGH (ref 70–99)
Potassium: 3 mEq/L — ABNORMAL LOW (ref 3.5–5.1)
Sodium: 136 mEq/L (ref 135–145)

## 2012-11-14 MED ORDER — OXYCODONE-ACETAMINOPHEN 5-325 MG PO TABS: 1.0000 | ORAL_TABLET | ORAL | Status: DC | PRN

## 2012-11-14 NOTE — Progress Notes (Signed)
Iv site has become slightly sore but is not pink and no edema.  Pt is taking in po's well.  No nausea or emesis this shift.  Has had 2 stools this shift and passing large amounts of gas.  Tolerating regular diet.  Pt asks that we cut of IVF's and not restart his IV b/c he is a very hard IV stick and it required multiple sticks to obtain this last iv.

## 2012-11-14 NOTE — Discharge Summary (Addendum)
Physician Discharge Summary  Patient ID: Mark Clayton MRN: [id number] DOB/AGE: 09-26-1940 72 y.o.  Admit date: 11/04/2012 Discharge date: 11/14/2012  Admission Diagnoses:  Polyps of the right colon in a history of node positive sigmoid colon cancer  Discharge Diagnoses:  same  Active Problems:   S/P right colectomy August 2014   Surgery:  Lap assisted right hemicolectomy  Discharged Condition: improved  Hospital Course:   Had surgery.  Developed ileus that took a few days to resolve.  Bowels began moving and diet advanced to regular.  Ready for discharge  Consults: none  Significant Diagnostic Studies: path showed no cancer in the right colon    Discharge Exam: Blood pressure 150/64, pulse 88, temperature 98.8 F (37.1 C), temperature source Oral, resp. rate 20, height 6' (1.829 m), weight 173 lb 3.2 oz (78.563 kg), SpO2 97.00%. Incisions OK.  Dressings removed and staples are to be removed.  Abdomen is soft and nontender    Disposition: 81-Discharged to home/self-care with a planned acute care hospital inpt readmission  Discharge Orders   Future Appointments Provider Department Dept Phone   11/17/2012 9:50 AM Valarie Merino, MD [facility], [place] [phone]   Future Orders Complete By Expires   Diet - low sodium heart healthy  As directed    Discharge instructions  As directed    Comments:     May shower upon getting home Advance diet as tolerated   Increase activity slowly  As directed    No dressing needed  As directed        Medication List         aspirin 81 MG tablet  Take 81 mg by mouth at bedtime.     carvedilol 6.25 MG tablet  Commonly known as:  COREG  Take 6.25 mg by mouth every morning.     carvedilol 25 MG tablet  Commonly known as:  COREG  Take 25 mg by mouth at bedtime.     furosemide 20 MG tablet  Commonly known as:  LASIX  Take 20 mg by mouth every morning.     ibuprofen 200 MG tablet  Commonly known as:  ADVIL,MOTRIN   Take 200 mg by mouth every 6 (six) hours as needed for pain.     nitroGLYCERIN 0.4 MG SL tablet  Commonly known as:  NITROSTAT  - Place 0.4 mg under the tongue every 5 (five) minutes as needed. Chest pain  -   -   -      omeprazole 20 MG capsule  Commonly known as:  PRILOSEC  Take 1 capsule (20 mg total) by mouth daily.     oxyCODONE-acetaminophen 5-325 MG per tablet  Commonly known as:  ROXICET  Take 1 tablet by mouth every 4 (four) hours as needed for pain.     simvastatin 80 MG tablet  Commonly known as:  ZOCOR  Take 40 mg by mouth every other day.           Follow-up Information   Follow up with [name] B, MD In 3 weeks.   Specialty:  General Surgery   Contact information:   [address] [place] [address] [phone]       Signed: Valarie Merino 11/14/2012, 10:47 AM

## 2012-11-17 ENCOUNTER — Encounter (INDEPENDENT_AMBULATORY_CARE_PROVIDER_SITE_OTHER): Payer: Medicare Other | Admitting: Surgery

## 2012-11-18 ENCOUNTER — Telehealth (INDEPENDENT_AMBULATORY_CARE_PROVIDER_SITE_OTHER): Payer: Self-pay

## 2012-11-18 NOTE — Telephone Encounter (Signed)
LMOM for pt letting him know that I have scheduled his 1st PO hemi colectomy for 9/4 @ 920am.

## 2012-11-24 ENCOUNTER — Other Ambulatory Visit (INDEPENDENT_AMBULATORY_CARE_PROVIDER_SITE_OTHER): Payer: Self-pay

## 2012-11-24 ENCOUNTER — Encounter (INDEPENDENT_AMBULATORY_CARE_PROVIDER_SITE_OTHER): Payer: Self-pay | Admitting: Surgery

## 2012-11-24 ENCOUNTER — Ambulatory Visit (INDEPENDENT_AMBULATORY_CARE_PROVIDER_SITE_OTHER): Payer: Medicare Other | Admitting: Surgery

## 2012-11-24 VITALS — BP 150/90 | HR 78 | Temp 97.9°F | Resp 14 | Ht 72.0 in | Wt 162.2 lb

## 2012-11-24 DIAGNOSIS — Z9889 Other specified postprocedural states: Secondary | ICD-10-CM

## 2012-11-24 DIAGNOSIS — R61 Generalized hyperhidrosis: Secondary | ICD-10-CM

## 2012-11-24 DIAGNOSIS — Z9049 Acquired absence of other specified parts of digestive tract: Secondary | ICD-10-CM

## 2012-11-24 LAB — CBC WITH DIFFERENTIAL/PLATELET
Basophils Absolute: 0.1 10*3/uL (ref 0.0–0.1)
Basophils Relative: 1 % (ref 0–1)
Eosinophils Absolute: 0.2 10*3/uL (ref 0.0–0.7)
Eosinophils Relative: 3 % (ref 0–5)
HCT: 34.6 % — ABNORMAL LOW (ref 39.0–52.0)
Hemoglobin: 11.3 g/dL — ABNORMAL LOW (ref 13.0–17.0)
Lymphocytes Relative: 23 % (ref 12–46)
Lymphs Abs: 1.3 10*3/uL (ref 0.7–4.0)
MCH: 27 pg (ref 26.0–34.0)
MCHC: 32.7 g/dL (ref 30.0–36.0)
MCV: 82.8 fL (ref 78.0–100.0)
Monocytes Absolute: 0.6 10*3/uL (ref 0.1–1.0)
Monocytes Relative: 11 % (ref 3–12)
Neutro Abs: 3.5 10*3/uL (ref 1.7–7.7)
Neutrophils Relative %: 62 % (ref 43–77)
Platelets: 300 10*3/uL (ref 150–400)
RBC: 4.18 MIL/uL — ABNORMAL LOW (ref 4.22–5.81)
RDW: 15.4 % (ref 11.5–15.5)
WBC: 5.6 10*3/uL (ref 4.0–10.5)

## 2012-11-24 NOTE — Progress Notes (Signed)
Mark Clayton 72 y.o.  Body mass index is 21.99 kg/(m^2).  Patient Active Problem List   Diagnosis Date Noted  . Adenocarcinoma of sigmoid colon Node positive 02/09/2011    Priority: High  . S/P right colectomy August 2014 11/14/2012  . ICD -Medtronic 03/09/2012  . Colon polyps 08/31/2011  . Microcytic anemia 01/12/2011  . Chest pain 12/26/2010  . ischemic cardiomyopathy s/p CABG  12/26/2010  . Cardiac defibrillator  MDT VVI 12/26/2010  . LEG CRAMPS 05/10/2009  . DEHYDRATION 04/29/2009  . AORTIC STENOSIS, MILD 04/29/2009  . HYPERLIPIDEMIA, MILD 12/06/2008  . CONGESTIVE HEART FAILURE, SYSTOLIC, CHRONIC 11/26/2008    Allergies  Allergen Reactions  . Latex Rash  . Tape Rash    USE PAPE    Past Surgical History  Procedure Laterality Date  . Cardiac defibrillator placement  01/17/03    6949 lead. medtronic. remote-no; with later revision  . Inguinal hernia repair      right  . Coronary artery bypass graft  02-06-11    11'03 6 vessel bypass  . Cataract extraction w/ intraocular lens  implant, bilateral  02-06-11    '09-june/ july-Dr. Dione Clayton  . Colon resection  02/09/2011    Procedure: LAPAROSCOPIC SIGMOID COLON RESECTION;  Surgeon: Mark Merino, MD;  Location: [facility];  Service: General;  Laterality: N/A;  Laparoscopic Assisted Sigmoid Colectomy  . Colonoscopy  08/31/2011    Procedure: COLONOSCOPY;  Surgeon: Mark Fiedler, MD;  Location: [facility];  Service: Gastroenterology;  Laterality: N/A;  . Colonoscopy N/A 09/05/2012    Procedure: COLONOSCOPY;  Surgeon: Mark Fiedler, MD;  Location: [facility];  Service: Gastroenterology;  Laterality: N/A;  . Laparoscopic right hemi colectomy N/A 11/04/2012    Procedure: LAPAROSCOPIC RIGHT HEMI COLECTOMY;  Surgeon: Mark Merino, MD;  Location: [facility];  Service: General;  Laterality: N/A;   Clayton,[name], DO No diagnosis found.  Ever since he has been home which is about 10 days he has had night sweats. There are no accompanying  fevers but very profound and profuse night sweats. Last night he said that his naked weight change about 5 pounds overnight. He spoke with Dr. Clovis Clayton who recommended that he come see me.  On physical exam he is having absolutely no abdominal pain his incision is healing very nicely. He had a good formed bowel movement today. The sweating episodes of seem like some sympathetic discharge. However I will obtain a CBC and a seen at an artery have an appointment to see him back next week.  Return September 4 Mark B. Daphine Deutscher, MD, [facility] [phone] beeper [phone]  11/24/2012 10:23 AM

## 2012-12-01 ENCOUNTER — Encounter (INDEPENDENT_AMBULATORY_CARE_PROVIDER_SITE_OTHER): Payer: Medicare Other | Admitting: Surgery

## 2012-12-01 ENCOUNTER — Ambulatory Visit (INDEPENDENT_AMBULATORY_CARE_PROVIDER_SITE_OTHER): Payer: Medicare Other | Admitting: Surgery

## 2012-12-01 ENCOUNTER — Encounter (INDEPENDENT_AMBULATORY_CARE_PROVIDER_SITE_OTHER): Payer: Self-pay | Admitting: Surgery

## 2012-12-01 VITALS — BP 126/70 | HR 66 | Temp 97.0°F | Resp 18 | Ht 72.0 in | Wt 160.0 lb

## 2012-12-01 DIAGNOSIS — Z9889 Other specified postprocedural states: Secondary | ICD-10-CM

## 2012-12-01 DIAGNOSIS — Z9049 Acquired absence of other specified parts of digestive tract: Secondary | ICD-10-CM

## 2012-12-01 NOTE — Progress Notes (Signed)
Mark Clayton 72 y.o.  Body mass index is 21.7 kg/(m^2).  Patient Active Problem List   Diagnosis Date Noted  . Adenocarcinoma of sigmoid colon Node positive 02/09/2011    Priority: High  . S/P right colectomy August 2014 11/14/2012  . ICD -Medtronic 03/09/2012  . Colon polyps 08/31/2011  . Microcytic anemia 01/12/2011  . Chest pain 12/26/2010  . ischemic cardiomyopathy s/p CABG  12/26/2010  . Cardiac defibrillator  MDT VVI 12/26/2010  . LEG CRAMPS 05/10/2009  . DEHYDRATION 04/29/2009  . AORTIC STENOSIS, MILD 04/29/2009  . HYPERLIPIDEMIA, MILD 12/06/2008  . CONGESTIVE HEART FAILURE, SYSTOLIC, CHRONIC 11/26/2008    Allergies  Allergen Reactions  . Latex Rash  . Tape Rash    USE PAPE    Past Surgical History  Procedure Laterality Date  . Cardiac defibrillator placement  01/17/03    6949 lead. medtronic. remote-no; with later revision  . Inguinal hernia repair      right  . Coronary artery bypass graft  02-06-11    11'03 6 vessel bypass  . Cataract extraction w/ intraocular lens  implant, bilateral  02-06-11    '09-june/ july-Dr. Dione Booze  . Colon resection  02/09/2011    Procedure: LAPAROSCOPIC SIGMOID COLON RESECTION;  Surgeon: Valarie Merino, MD;  Location: [facility];  Service: General;  Laterality: N/A;  Laparoscopic Assisted Sigmoid Colectomy  . Colonoscopy  08/31/2011    Procedure: COLONOSCOPY;  Surgeon: Beverley Fiedler, MD;  Location: [facility];  Service: Gastroenterology;  Laterality: N/A;  . Colonoscopy N/A 09/05/2012    Procedure: COLONOSCOPY;  Surgeon: Beverley Fiedler, MD;  Location: [facility];  Service: Gastroenterology;  Laterality: N/A;  . Laparoscopic right hemi colectomy N/A 11/04/2012    Procedure: LAPAROSCOPIC RIGHT HEMI COLECTOMY;  Surgeon: Valarie Merino, MD;  Location: [facility];  Service: General;  Laterality: N/A;   Mark Clayton,[name], DO No diagnosis found.  Doing well.  Night sweats have ceased.  BMs are formed.  Will see back in 2 months Mark B. Daphine Deutscher, MD,  [facility] [phone] beeper [phone]  12/01/2012 9:57 AM

## 2012-12-01 NOTE — Patient Instructions (Addendum)
Thanks for your patience.  If you need further assistance after leaving the office, please call our office and speak with a [facility] nurse.  [phone].  If you want to leave a message for Dr. Marlo Arriola, please call his office phone at [phone]. 

## 2012-12-20 ENCOUNTER — Telehealth: Payer: Self-pay | Admitting: Internal Medicine

## 2012-12-20 NOTE — Telephone Encounter (Signed)
Pt reports he had a few problems after his colectomy, but he's doing fine now. He wants to talk with Dr Rhea Belton about his surgery and future COLON. Pt will f/u on 01/13/13.

## 2013-01-03 ENCOUNTER — Encounter: Payer: Self-pay | Admitting: *Deleted

## 2013-01-11 ENCOUNTER — Encounter: Payer: Self-pay | Admitting: Internal Medicine

## 2013-01-13 ENCOUNTER — Ambulatory Visit (INDEPENDENT_AMBULATORY_CARE_PROVIDER_SITE_OTHER): Payer: Medicare Other | Admitting: Internal Medicine

## 2013-01-13 ENCOUNTER — Encounter: Payer: Self-pay | Admitting: Internal Medicine

## 2013-01-13 VITALS — BP 122/70 | HR 72 | Ht 71.0 in | Wt 166.4 lb

## 2013-01-13 DIAGNOSIS — Z86010 Personal history of colonic polyps: Secondary | ICD-10-CM

## 2013-01-13 DIAGNOSIS — Z9889 Other specified postprocedural states: Secondary | ICD-10-CM

## 2013-01-13 DIAGNOSIS — Z85038 Personal history of other malignant neoplasm of large intestine: Secondary | ICD-10-CM

## 2013-01-13 DIAGNOSIS — Z9049 Acquired absence of other specified parts of digestive tract: Secondary | ICD-10-CM

## 2013-01-13 DIAGNOSIS — Z1211 Encounter for screening for malignant neoplasm of colon: Secondary | ICD-10-CM

## 2013-01-13 MED ORDER — SOD PICOSULFATE-MAG OX-CIT ACD 10-3.5-12 MG-GM-GM PO PACK: 1.0000 | PACK | Freq: Once | ORAL | Status: DC

## 2013-01-13 MED ORDER — METOCLOPRAMIDE HCL 10 MG PO TABS: 10.0000 mg | ORAL_TABLET | Freq: Every day | ORAL | Status: DC

## 2013-01-13 MED ORDER — MOVIPREP 100 G PO SOLR: ORAL | Status: DC

## 2013-01-13 NOTE — Patient Instructions (Addendum)
You have been scheduled for a colonoscopy with propofol. Please follow written instructions given to you at your visit today.  Please pick up your prep kit at the pharmacy within the next 1-3 days. If you use inhalers (even only as needed), please bring them with you on the day of your procedure. Your physician has requested that you go to www.startemmi.com and enter the access code given to you at your visit today. This web site gives a general overview about your procedure. However, you should still follow specific instructions given to you by our office regarding your preparation for the procedure.  We have sent the following medications to your pharmacy for you to pick up at your convenience:Reglan  Dr. Rhea Belton would like you to be cleared for a Colonoscopy by Dr. Daphine Deutscher                                               We are excited to introduce MyChart, a new best-in-class service that provides you online access to important information in your electronic medical record. We want to make it easier for you to view your health information - all in one secure location - when and where you need it. We expect MyChart will enhance the quality of care and service we provide.  When you register for MyChart, you can:    View your test results.    Request appointments and receive appointment reminders via email.    Request medication renewals.    View your medical history, allergies, medications and immunizations.    Communicate with your physician's office through a password-protected site.    Conveniently print information such as your medication lists.  To find out if MyChart is right for you, please talk to a member of our clinical staff today. We will gladly answer your questions about this free health and wellness tool.  If you are age 72 or older and want a member of your family to have access to your record, you must provide written consent by completing a proxy form available at our  office. Please speak to our clinical staff about guidelines regarding accounts for patients younger than age 56.  As you activate your MyChart account and need any technical assistance, please call the MyChart technical support line at [phone]) [phone]) or email your question to [email or web address]. If you email your question(s), please include your name, a return phone number and the best time to reach you.  If you have non-urgent health-related questions, you can send a message to our office through MyChart at [email or web address]. If you have a medical emergency, call 911.  Thank you for using MyChart as your new health and wellness resource!   MyChart licensed from [employer],  [phone]. Patents Pending.

## 2013-01-13 NOTE — Progress Notes (Signed)
Subjective:    Patient ID: Mark Clayton, male    DOB: 1941-03-28, 72 y.o.   MRN: [id number]  HPI Mark Clayton is a 72 year old man with a past medical history of ischemic cardiomyopathy status post CABG and ICD placement, hypertension, hyperlipidemia, sigmoid colon cancer status post resection, and subsequent large right colon adenoma status post right hemicolectomy who is seen for followup. Mark Clayton is alone today. He has done well after his right hemicolectomy for an endoscopically unresectable tubulovillous adenoma.  His hospitalization was complicated by postoperative ileus and he spent about 11 days in the hospital. Since being discharged he has done well. He reports his stools have returned completely to normal. He is having 1-2 formed [place] stools daily. No abdominal pain. No rectal bleeding or melena. He reports he is eating very well and has been able to gain about 10 pounds since leaving the hospital. He denies nausea or vomiting. No significant heartburn. No shortness of breath or chest pain.   Review of Systems As per history of present illness, otherwise negative  Current Medications, Allergies, Past Medical History, Past Surgical History, Family History and Social History were reviewed in [employer] record.     Objective:   Physical Exam BP 122/70  Pulse 72  Ht 5\' 11"  (1.803 m)  Wt 166 lb 6 oz (75.467 kg)  BMI 23.21 kg/m2 Constitutional: Well-developed and well-nourished. No distress. HEENT: Normocephalic and atraumatic. Oropharynx is clear and moist. No oropharyngeal exudate. Conjunctivae are normal.  No scleral icterus. Neck: Neck supple. Trachea midline. Cardiovascular: Normal rate, regular rhythm and intact distal pulses. Pulmonary/chest: Effort normal and breath sounds normal. No wheezing, rales or rhonchi. Abdominal: Soft, nontender, nondistended. Bowel sounds active throughout. Well-healed abdominal scars Extremities: no clubbing, cyanosis, or  edema Neurological: Alert and oriented to person place and time. Skin: Skin is warm and dry. No rashes noted. Psychiatric: Normal mood and affect. Behavior is normal.  CBC    Component Value Date/Time   WBC 5.6 11/24/2012 1045   RBC 4.18* 11/24/2012 1045   HGB 11.3* 11/24/2012 1045   HCT 34.6* 11/24/2012 1045   PLT 300 11/24/2012 1045   MCV 82.8 11/24/2012 1045   MCH 27.0 11/24/2012 1045   MCHC 32.7 11/24/2012 1045   RDW 15.4 11/24/2012 1045   LYMPHSABS 1.3 11/24/2012 1045   MONOABS 0.6 11/24/2012 1045   EOSABS 0.2 11/24/2012 1045   BASOSABS 0.1 11/24/2012 1045    CT ABD/PELVIS - 09/13/2012  **ADDENDUM** CREATED: 09/13/2012 11:52:11   Dictation error in the impression of the original report.  The last line of the impression should read:   "NO evidence of metastatic disease in the abdomen/pelvis."   The remainder of the report, including the findings, are unchanged.   **END ADDENDUM** SIGNED BY: Charline Bills, M.D.     Study Result    *RADIOLOGY REPORT*   Clinical Data: Evaluate for colon mass in the ascending/transverse area, recent polypectomy, history of sigmoid colon cancer status post resection   CT ABDOMEN AND PELVIS WITH CONTRAST   Technique:  Multidetector CT imaging of the abdomen and pelvis was performed following the standard protocol during bolus administration of intravenous contrast.   Contrast: OMNIPAQUE IOHEXOL 300 MG/ML  SOLN   Comparison: 01/20/2011   Findings: Lung bases are clear.   Cardiomegaly.  ICD leads, incompletely visualized.   Tiny hiatal hernia.   Tiny hypoenhancing lesion in the right hepatic dome (series 2/image 9), likely reflecting a small cyst, unchanged.  Spleen, pancreas, and adrenal glands are within normal limits.   Gallbladder is unremarkable.  No intrahepatic or extrahepatic ductal dilatation.   Small bilateral renal cysts, measuring up to 9 mm in the left lower pole (series 5/image 25) and 8 mm in the right lower  pole (series 5/image 28).  No hydronephrosis.   No evidence of bowel obstruction. Duodenal lipoma (series 2/image 39).  Normal appendix.  Prior sigmoid resection with anastomoses in the lower abdomen/pelvis (series 2/image 70).  Known ascending/transverse colonic mass is not evident by CT.   Atherosclerotic calcifications of the abdominal aorta and branch vessels.   No abdominopelvic ascites.   No suspicious abdominopelvic lymphadenopathy.   Mild prostatomegaly, measuring 5.1 cm in transverse dimension.   Bladder is mildly thick-walled.   Small fat-containing bilateral inguinal hernias.   Degenerative changes of the visualized thoracolumbar spine.   IMPRESSION:   Known ascending/transverse colonic mass is not evident by CT.   Prior sigmoid resection.   Evidence of metastatic disease in the abdomen/pelvis.     Colonoscopy 01/19/2011 -- poor prep, incomplete exam. Large sigmoid colon mass and a rectal polyp Colonoscopy June 2013 -- to the terminal ileum, 5 polyps 2-12 mm in size, granulation tissue at anastomosis, diverticulosis.  Pathology adenomatous polyps Colonoscopy June 2014 -- ascending colon mass (2 other polyps in the ascending colon not removed due to impending right colon resection), multiple other polyps in the transverse and left colon.  Pathology = tubulovillous adenoma in ascending colon mass, tubular adenoma in the other polyps removed    Assessment & Plan:  72 year old man with a past medical history of ischemic cardiomyopathy status post CABG and ICD placement, hypertension, hyperlipidemia, sigmoid colon cancer status post resection, and subsequent large right colon adenoma status post right hemicolectomy who is seen for followup  1.  Hx of colon cancer/hx of adenomatous colon polyps/high-risk colon cancer screening -- fortunately Mark Clayton has done well now after 2 colon surgeries, the first with sigmoidectomy, the second was right hemicolectomy (the later in  Aug 2014).  He is extremely high risk for future colon polyps, and has proven that his polyps can grow quite rapidly. We discussed the findings of his previous colonoscopies and he understands that he is high risk. In his case, and on an individual basis, national surveillance guidelines are likely not aggressive enough given his history. He understands this recommendation and we have discussed repeating the colonoscopy once he is cleared by Dr. Daphine Deutscher. He will see Dr. Daphine Deutscher again in early November. We have scheduled colonoscopy for 03/06/2013 for surveillance. The colonoscopy was discussed in detail including the risks and benefits and he is agreeable to proceed. He makes it very clear that he does not want to have any further colon surgery, nor have her risk of having a colostomy. Hopefully, we will be able to identify and remove polyps before they grow to a size that would be endoscopically unresectable.

## 2013-01-17 ENCOUNTER — Telehealth: Payer: Self-pay | Admitting: *Deleted

## 2013-01-17 ENCOUNTER — Encounter: Payer: Self-pay | Admitting: Internal Medicine

## 2013-01-17 DIAGNOSIS — I350 Nonrheumatic aortic (valve) stenosis: Secondary | ICD-10-CM

## 2013-01-17 DIAGNOSIS — I509 Heart failure, unspecified: Secondary | ICD-10-CM

## 2013-01-17 NOTE — Telephone Encounter (Signed)
Message copied by Florene Glen on Tue Jan 17, 2013 11:07 AM ------      Message from: Beverley Fiedler      Created: Tue Jan 17, 2013  8:44 AM      Regarding: RE: LEC colon       Mr. Bordon is very much interested in having his follow-up procedure at the [facility], rather than [place].      I ran it by Jonny Ruiz, but in order to do so, we need documentation that his cardiac ejection fraction is >35%.  It was 28% in 2012 when last checked.      Therefore his options are repeat echo to eval EF or change his current appt to [facility].      Thanks      JMP            ----- Message -----         From: Cathlyn Parsons, CRNA         Sent: 01/16/2013   6:05 PM           To: Beverley Fiedler, MD      Subject: RE: LEC colon                                            Dr. Rhea Belton,            I don't know why but this message just showed up in my box.            I have reviewed this pt; unfortunately his last EF was 28%.  This value was determined during a myoview 12/30/10 and he also had chest and neck tightness along with EKG changes.  He was supposed to have a f/u cardiac cath but I do not see if that was ever done. Maybe you or Kennyth Arnold can track down a cath report; if his EF is > 35% he is qualified for LEC.            Best regards,            John                  ----- Message -----         From: Beverley Fiedler, MD         Sent: 01/13/2013   5:34 PM           To: Cathlyn Parsons, CRNA      Subject: LEC colon                                                John      Just wanted to bring this man to your attention.      I feel he is appropriate for LEC propofol procedure, just wanted you to look him over to see that you agree.      He has had LEC procedure before      He also recently had right colon surgery and did very well.      Thanks      JMP                   ------

## 2013-01-17 NOTE — Telephone Encounter (Signed)
Dr Graciela Husbands, would you consider ordering an ECHO on pt to evaluate his EF%. Last ECHO in 2012 , EF was 28%. Pt would like his repeat procedure at our facility rather than the hospital and he needs an EF on >35% . Thanks, Graciella Freer RN for Dr Erick Blinks.

## 2013-01-23 ENCOUNTER — Telehealth: Payer: Self-pay | Admitting: *Deleted

## 2013-01-23 NOTE — Telephone Encounter (Signed)
Message copied by Florene Glen on Mon Jan 23, 2013 11:18 AM ------      Message from: Beverley Fiedler      Created: Tue Jan 17, 2013  8:44 AM      Regarding: RE: LEC colon       Mark Clayton is very much interested in having his follow-up procedure at the [facility], rather than [place].      I ran it by Mark Clayton, but in order to do so, we need documentation that his cardiac ejection fraction is >35%.  It was 28% in 2012 when last checked.      Therefore his options are repeat echo to eval EF or change his current appt to [facility].      Thanks      JMP            ----- Message -----         From: Cathlyn Parsons, CRNA         Sent: 01/16/2013   6:05 PM           To: Beverley Fiedler, MD      Subject: RE: LEC colon                                            Dr. Rhea Belton,            I don't know why but this message just showed up in my box.            I have reviewed this pt; unfortunately his last EF was 28%.  This value was determined during a myoview 12/30/10 and he also had chest and neck tightness along with EKG changes.  He was supposed to have a f/u cardiac cath but I do not see if that was ever done. Maybe you or Kennyth Arnold can track down a cath report; if his EF is > 35% he is qualified for LEC.            Best regards,            Mark Clayton                  ----- Message -----         From: Beverley Fiedler, MD         Sent: 01/13/2013   5:34 PM           To: Cathlyn Parsons, CRNA      Subject: LEC colon                                                Mark Clayton      Just wanted to bring this man to your attention.      I feel he is appropriate for LEC propofol procedure, just wanted you to look him over to see that you agree.      He has had LEC procedure before      He also recently had right colon surgery and did very well.      Thanks      JMP                   ------

## 2013-01-24 NOTE — Telephone Encounter (Signed)
Dr Rhea Belton asked [place] to make the appt for there ECHO. Referral mad; I am waiting on prior auth.

## 2013-01-24 NOTE — Telephone Encounter (Signed)
Pt is scheduled for ECHO on 01/27/13 at 0900am at [facility]; pt stated understanding.

## 2013-01-26 ENCOUNTER — Other Ambulatory Visit (HOSPITAL_COMMUNITY): Payer: Medicare Other

## 2013-01-27 ENCOUNTER — Ambulatory Visit (INDEPENDENT_AMBULATORY_CARE_PROVIDER_SITE_OTHER): Payer: Medicare Other | Admitting: General Practice

## 2013-01-27 ENCOUNTER — Telehealth: Payer: Self-pay | Admitting: *Deleted

## 2013-01-27 ENCOUNTER — Other Ambulatory Visit: Payer: Medicare Other

## 2013-01-27 ENCOUNTER — Ambulatory Visit (HOSPITAL_COMMUNITY)
Admission: RE | Admit: 2013-01-27 | Discharge: 2013-01-27 | Disposition: A | Discharge status: Home / Self Care | Payer: Medicare Other | Source: Ambulatory Visit | Attending: Internal Medicine | Admitting: Internal Medicine

## 2013-01-27 ENCOUNTER — Other Ambulatory Visit: Payer: Self-pay | Admitting: *Deleted

## 2013-01-27 DIAGNOSIS — I080 Rheumatic disorders of both mitral and aortic valves: Secondary | ICD-10-CM | POA: Insufficient documentation

## 2013-01-27 DIAGNOSIS — I5022 Chronic systolic (congestive) heart failure: Secondary | ICD-10-CM

## 2013-01-27 DIAGNOSIS — Z95810 Presence of automatic (implantable) cardiac defibrillator: Secondary | ICD-10-CM | POA: Insufficient documentation

## 2013-01-27 DIAGNOSIS — I079 Rheumatic tricuspid valve disease, unspecified: Secondary | ICD-10-CM | POA: Insufficient documentation

## 2013-01-27 DIAGNOSIS — I517 Cardiomegaly: Secondary | ICD-10-CM | POA: Insufficient documentation

## 2013-01-27 DIAGNOSIS — E785 Hyperlipidemia, unspecified: Secondary | ICD-10-CM | POA: Insufficient documentation

## 2013-01-27 DIAGNOSIS — I350 Nonrheumatic aortic (valve) stenosis: Secondary | ICD-10-CM

## 2013-01-27 DIAGNOSIS — I059 Rheumatic mitral valve disease, unspecified: Secondary | ICD-10-CM

## 2013-01-27 DIAGNOSIS — Z951 Presence of aortocoronary bypass graft: Secondary | ICD-10-CM | POA: Insufficient documentation

## 2013-01-27 DIAGNOSIS — I509 Heart failure, unspecified: Secondary | ICD-10-CM | POA: Insufficient documentation

## 2013-01-27 MED ORDER — PERFLUTREN LIPID MICROSPHERE
1.0000 mL | INTRAVENOUS | Status: AC | PRN
  Filled 2013-01-27: qty 10

## 2013-01-27 NOTE — Progress Notes (Signed)
CN called for 2D echo with  Definity in Cardiopulmonary department. Pt A/O, cooperative. Procedure explained. Preprocedure BP 160's/90's. Pt tolerated Definity well. PT up getting dressed without problems.

## 2013-01-27 NOTE — Progress Notes (Signed)
Pt dx by echo today with new mural thrombus, per Dr Gala Romney start on Coumadin and Lovenox.  Calculated Lovenox dosage 80mg  BID.  Counseled pt in office pt does not want to start Lovenox or Coumadin before speaking with Dr Graciela Husbands.  Pt is aware of risks associated with not being anticoagulated, including clot, stroke and even death.  Pt believes this finding is not new and states something was present in 2012 when he had Myoview and Dr Graciela Husbands reviewed and decided no need to treat.  I could not find evidence of mural thrombus in past in pt's chart.  Sherri, RN Dr Odessa Fleming RN came and spoke with pt as well.  Encouraged pt to start anticoagulation therapy as rx by Dr Gala Romney and reviewed risks again with pt.  Pt was made a f/u appt with Dr Graciela Husbands for 01/31/13 and pt states he is aware of risks but wishes to wait to start anticoagulation tx until discusses with Dr Graciela Husbands first.

## 2013-01-27 NOTE — Telephone Encounter (Signed)
We sent pt for an ECHO today to see if his EF improved since last one; hoping for improvement so pt can his his COLON done here. Dr Diona Browner read the ECHO this am, called and gave me report that EF has not improved and he has a mural thrombus. He asked me to call Dr Graciela Husbands for instructions; pt is not on anti coagulants. Paged Dr Graciela Husbands, but he is in the middle of a procedure. [phone]      Called his nurse Trish [phone] and she will have the [facility] call me; Dr Gala Romney. Spoke with Dr Gala Romney who states pt needs to be on Coumadin and to call [phone]. Called the [facility] to see if I can get him in today.  pt will go to [facility] after he eats.

## 2013-01-27 NOTE — Progress Notes (Signed)
*  PRELIMINARY RESULTS* Echocardiogram 2D Echocardiogram has been performed.  Mark Clayton 01/27/2013, 11:04 AM

## 2013-01-31 ENCOUNTER — Encounter: Payer: Self-pay | Admitting: Internal Medicine

## 2013-01-31 ENCOUNTER — Ambulatory Visit (INDEPENDENT_AMBULATORY_CARE_PROVIDER_SITE_OTHER): Payer: Medicare Other | Admitting: Internal Medicine

## 2013-01-31 VITALS — BP 136/50 | HR 61 | Ht 70.5 in | Wt 165.0 lb

## 2013-01-31 DIAGNOSIS — Z95810 Presence of automatic (implantable) cardiac defibrillator: Secondary | ICD-10-CM

## 2013-01-31 DIAGNOSIS — I2589 Other forms of chronic ischemic heart disease: Secondary | ICD-10-CM

## 2013-01-31 DIAGNOSIS — I219 Acute myocardial infarction, unspecified: Secondary | ICD-10-CM

## 2013-01-31 DIAGNOSIS — I236 Thrombosis of atrium, auricular appendage, and ventricle as current complications following acute myocardial infarction: Secondary | ICD-10-CM | POA: Insufficient documentation

## 2013-01-31 DIAGNOSIS — I509 Heart failure, unspecified: Secondary | ICD-10-CM

## 2013-01-31 DIAGNOSIS — I238 Other current complications following acute myocardial infarction: Secondary | ICD-10-CM

## 2013-01-31 LAB — ICD DEVICE OBSERVATION
BATTERY VOLTAGE: 3.09 V
BRDY-0002RV: 40 {beats}/min
CHARGE TIME: 10.1 s
DEV-0020ICD: NEGATIVE
RV LEAD AMPLITUDE: 20 mv
RV LEAD IMPEDENCE ICD: 532 Ohm
RV LEAD THRESHOLD: 1.25 V
TZAT-0001FASTVT: 1
TZAT-0001SLOWVT: 1
TZAT-0002FASTVT: NEGATIVE
TZAT-0002SLOWVT: NEGATIVE
TZAT-0012FASTVT: 200 ms
TZAT-0012SLOWVT: 200 ms
TZAT-0018FASTVT: NEGATIVE
TZAT-0018SLOWVT: NEGATIVE
TZAT-0019FASTVT: 8 V
TZAT-0019SLOWVT: 8 V
TZAT-0020FASTVT: 1.5 ms
TZAT-0020SLOWVT: 1.5 ms
TZON-0003SLOWVT: 370 ms
TZON-0003VSLOWVT: 370 ms
TZON-0004SLOWVT: 16
TZON-0004VSLOWVT: 32
TZON-0005SLOWVT: 12
TZST-0001FASTVT: 2
TZST-0001FASTVT: 3
TZST-0001FASTVT: 4
TZST-0001FASTVT: 5
TZST-0001FASTVT: 6
TZST-0001SLOWVT: 2
TZST-0001SLOWVT: 3
TZST-0001SLOWVT: 4
TZST-0001SLOWVT: 5
TZST-0001SLOWVT: 6
TZST-0002FASTVT: NEGATIVE
TZST-0002FASTVT: NEGATIVE
TZST-0002FASTVT: NEGATIVE
TZST-0002FASTVT: NEGATIVE
TZST-0002FASTVT: NEGATIVE
TZST-0002SLOWVT: NEGATIVE
TZST-0002SLOWVT: NEGATIVE
TZST-0002SLOWVT: NEGATIVE
TZST-0002SLOWVT: NEGATIVE
TZST-0002SLOWVT: NEGATIVE
VENTRICULAR PACING ICD: 0.1 pct

## 2013-01-31 NOTE — Assessment & Plan Note (Signed)
The patient was noted by contrast echo to have a well-circumscribed LV apical clot in the setting of apical akinesis. Echocardiogram 2009 and demonstrated something similar. He had been on warfarin following his MI and this was stopped after number of months. I reviewed the data and as best as I can tell from guidelines anticoagulation is recommended following the identification of clot in the acute setting. There are no data regarding anticoagulation for chronic clot. The literature I did find suggested that the risk and clots found after one month was exceedingly low. This data was in 58.

## 2013-01-31 NOTE — Telephone Encounter (Signed)
Pt called after seeing Dr Graciela Husbands. Dr Graciela Husbands found the old film showing the thrombus. He was not placed on anti coags and per Mr Flatt, Dr Graciela Husbands states he knows he needs COLONS at least once a year and he's OK with it. Dr Rhea Belton , want [name] to set up a hospital COLON? Thanks.

## 2013-01-31 NOTE — Progress Notes (Signed)
Patient has no care team.   HPI  Mark Clayton is a 72 y.o. male Seen following an echocardiogram ordered by GI that demonstrated LV clot. It also demonstrated severe left ventricular dysfunction with an EF of 20%. He has significant akinesis of the apical inferolateral and anteroseptal myocardium there was no evidence of aortic stenosis.  Review of old echocardiograms demonstrated August 2009 at the suggestion of an apical clot. He been treated with warfarin following his MI by Dr. Edwin Cap; nystatin been stopped some months later.   He is status post ICD implantation for primary prevention with 6949-lead. He recently underwent device generator replacement with insertion of a new right ventricular pace sense lead.  He underwent stress testing in Jan 2012 for preopclearance which showed no ischemia,       The patient denies chest pain, shortness of breath, nocturnal dyspnea, orthopnea or peripheral edema. There have been no palpitations, lightheadedness or syncope.  In the last year he has undergone partial colectomy for cancer.   He recently climb 4 flights of stairs   Past Medical History  Diagnosis Date  . Ischemic cardiomyopathy     proir bypass. EF 25%. (Master study EF >20%)  . Carotid bruit   . Hyperlipidemia   . CAD (coronary artery disease)     severe 3 vessel CAD w/unstable angina and acute MI  . HTN (hypertension)     systolic  . Myocardial infarct 02-06-11    '03-MI  . GERD (gastroesophageal reflux disease) 02-06-11    reflux is controlled-Omeprazole  . Colon cancer 02-06-11     Sigmoid  . Anemia 02-06-11    takes oral iron  . ICD (implantable cardiac defibrillator) in place   . Pacemaker   . CHF (congestive heart failure)   . H/O hiatal hernia   . Cough     Past Surgical History  Procedure Laterality Date  . Cardiac defibrillator placement  01/17/03    6949 lead. medtronic. remote-no; with later revision  . Inguinal hernia repair      right  .  Coronary artery bypass graft  02-06-11    11'03 6 vessel bypass  . Cataract extraction w/ intraocular lens  implant, bilateral  02-06-11    '09-june/ july-Dr. Dione Booze  . Colon resection  02/09/2011    Procedure: LAPAROSCOPIC SIGMOID COLON RESECTION;  Surgeon: Valarie Merino, MD;  Location: [facility];  Service: General;  Laterality: N/A;  Laparoscopic Assisted Sigmoid Colectomy  . Colonoscopy  08/31/2011    Procedure: COLONOSCOPY;  Surgeon: Beverley Fiedler, MD;  Location: [facility];  Service: Gastroenterology;  Laterality: N/A;  . Colonoscopy N/A 09/05/2012    Procedure: COLONOSCOPY;  Surgeon: Beverley Fiedler, MD;  Location: [facility];  Service: Gastroenterology;  Laterality: N/A;  . Laparoscopic right hemi colectomy N/A 11/04/2012    Procedure: LAPAROSCOPIC RIGHT HEMI COLECTOMY;  Surgeon: Valarie Merino, MD;  Location: [facility];  Service: General;  Laterality: N/A;    Current Outpatient Prescriptions  Medication Sig Dispense Refill  . aspirin 81 MG tablet Take 81 mg by mouth at bedtime.       . carvedilol (COREG) 25 MG tablet Take 25 mg by mouth at bedtime.      . carvedilol (COREG) 6.25 MG tablet Take 6.25 mg by mouth every morning.      . furosemide (LASIX) 20 MG tablet Take 20 mg by mouth every morning.      Marland Kitchen ibuprofen (ADVIL,MOTRIN) 200 MG tablet  Take 200 mg by mouth every 6 (six) hours as needed for pain.      Marland Kitchen metoCLOPramide (REGLAN) 10 MG tablet Take 1 tablet (10 mg total) by mouth daily.  2 tablet  0  . nitroGLYCERIN (NITROSTAT) 0.4 MG SL tablet Place 0.4 mg under the tongue every 5 (five) minutes as needed. Chest pain         . omeprazole (PRILOSEC) 20 MG capsule Take 1 capsule (20 mg total) by mouth daily.  90 capsule  3  . simvastatin (ZOCOR) 80 MG tablet Take 40 mg by mouth every other day.       . Sod Picosulfate-Mag Ox-Cit Acd 10-3.5-12 MG-GM-GM PACK Take 1 kit by mouth once.  1 each  0   No current facility-administered medications for this visit.    Allergies  Allergen  Reactions  . Latex Rash  . Tape Rash    USE PAPE    Review of Systems negative except from HPI and PMH  Physical Exam BP 136/50  Pulse 61  Ht 5' 10.5" (1.791 m)  Wt 165 lb (74.844 kg)  BMI 23.33 kg/m2 Well developed and nourished in no acute distress HENT normal Neck supple with JVP-flat Clear Regular rate and rhythm, no murmurs or gallops Abd-soft with active BS No Clubbing cyanosis edema Skin-warm and dry A & Oriented  Grossly normal sensory and motor function

## 2013-01-31 NOTE — Patient Instructions (Signed)
Your physician recommends that you schedule a follow-up appointment in: 3 months with the device clinic.  Your physician wants you to follow-up in: 6 months with Dr. Graciela Husbands. You will receive a reminder letter in the mail two months in advance. If you don't receive a letter, please call our office to schedule the follow-up appointment.  Your physician recommends that you continue on your current medications as directed. Please refer to the Current Medication list given to you today.

## 2013-02-01 NOTE — Telephone Encounter (Signed)
Due to his persistent low EF by recent ECHO his surveillance colonoscopy needs to be performed at [facility] as an outpt with MAC sedation (he did not prefer this, but per LEC guidelines I cannot perform his procedure in our endo center). This can be scheduled at his convenience

## 2013-02-03 ENCOUNTER — Encounter (INDEPENDENT_AMBULATORY_CARE_PROVIDER_SITE_OTHER): Payer: Medicare Other | Admitting: Surgery

## 2013-02-14 ENCOUNTER — Telehealth: Payer: Self-pay | Admitting: *Deleted

## 2013-02-14 NOTE — Telephone Encounter (Signed)
Informed wife Dr Rhea Belton states it's OK to wait until January, 2015 to schedule his COLON; I will call him to schedule.

## 2013-02-14 NOTE — Telephone Encounter (Signed)
Wife called to report if pt waits til January, 2015, his COLON will be free. Last COLON was 09/05/12 followed by a colectomy on 11/04/12. Dr Rhea Belton , does pt have to be scheduled at [facility]; Kennyth Arnold said she thought you told her here and he is scheduled for 03/06/13? Thanks.

## 2013-02-14 NOTE — Telephone Encounter (Signed)
This has been taken care of.

## 2013-02-14 NOTE — Telephone Encounter (Signed)
He does need to be done at the hospital in light of his recent ECHO and low EF Jan 2015 is okay for repeat surveillance colonoscopy if this will help him significantly financially

## 2013-03-03 ENCOUNTER — Encounter: Payer: Medicare Other | Admitting: Internal Medicine

## 2013-03-06 ENCOUNTER — Encounter: Payer: Medicare Other | Admitting: Internal Medicine

## 2013-03-15 ENCOUNTER — Telehealth: Payer: Self-pay | Admitting: *Deleted

## 2013-03-15 NOTE — Telephone Encounter (Signed)
Pt called to schedule his COLON in January. Informed him Dr Lauro Franklin next week is 05/02/13, can he do it then. Pt stated January will already make him overdo for 6 months. Do you want me to schedule in January and block your schedule here? Thanks.

## 2013-03-15 NOTE — Telephone Encounter (Signed)
January procedure with MAC is okay with me, okay to block time off from my regular schedule to allow for this colonoscopy with MAC in outpatient hospital setting

## 2013-03-16 ENCOUNTER — Other Ambulatory Visit: Payer: Self-pay | Admitting: *Deleted

## 2013-03-16 DIAGNOSIS — Z85038 Personal history of other malignant neoplasm of large intestine: Secondary | ICD-10-CM

## 2013-03-16 DIAGNOSIS — Z86010 Personal history of colonic polyps: Secondary | ICD-10-CM

## 2013-03-16 DIAGNOSIS — Z9049 Acquired absence of other specified parts of digestive tract: Secondary | ICD-10-CM

## 2013-03-16 NOTE — Telephone Encounter (Signed)
Pt will have PV on 04/10/13 and his COLON at [facility] on 04/18/13 at 0800am; pt stated understanding.

## 2013-03-27 ENCOUNTER — Encounter (HOSPITAL_COMMUNITY): Payer: Self-pay | Admitting: Pharmacy Technician

## 2013-04-03 ENCOUNTER — Encounter (HOSPITAL_COMMUNITY): Payer: Self-pay | Admitting: *Deleted

## 2013-04-10 ENCOUNTER — Ambulatory Visit (AMBULATORY_SURGERY_CENTER): Payer: Medicare PPO | Admitting: *Deleted

## 2013-04-10 VITALS — Ht 71.5 in | Wt 172.4 lb

## 2013-04-10 DIAGNOSIS — Z85038 Personal history of other malignant neoplasm of large intestine: Secondary | ICD-10-CM

## 2013-04-10 MED ORDER — PREPOPIK 10-3.5-12 MG-GM-GM PO PACK: PACK | ORAL | Status: DC

## 2013-04-10 NOTE — Progress Notes (Signed)
Patient states he was given the prepopik by Dr.Pyrtle during his office visit. Instructions given for that at this time.

## 2013-04-10 NOTE — Progress Notes (Signed)
Patient denies any allergies to eggs or soy. Patient denies any problems with anesthesia.  

## 2013-04-17 NOTE — Anesthesia Preprocedure Evaluation (Addendum)
Anesthesia Evaluation  Patient identified by MRN, date of birth, ID band Patient awake    Reviewed: Allergy & Precautions, H&P , NPO status , Patient's Chart, lab work & pertinent test results  Airway Mallampati: II TM Distance: >3 FB Neck ROM: Full    Dental no notable dental hx.    Pulmonary neg pulmonary ROS,  breath sounds clear to auscultation  Pulmonary exam normal       Cardiovascular hypertension, Pt. on medications + CAD, + Past MI, + CABG and +CHF + Cardiac Defibrillator Rhythm:Regular Rate:Normal     Neuro/Psych negative neurological ROS  negative psych ROS   GI/Hepatic negative GI ROS, Neg liver ROS,   Endo/Other  negative endocrine ROS  Renal/GU negative Renal ROS  negative genitourinary   Musculoskeletal negative musculoskeletal ROS (+)   Abdominal   Peds negative pediatric ROS (+)  Hematology negative hematology ROS (+)   Anesthesia Other Findings   Reproductive/Obstetrics negative OB ROS                          Anesthesia Physical Anesthesia Plan  ASA: IV  Anesthesia Plan: MAC   Post-op Pain Management:    Induction: Intravenous  Airway Management Planned: Simple Face Mask  Additional Equipment:   Intra-op Plan:   Post-operative Plan:   Informed Consent: I have reviewed the patients History and Physical, chart, labs and discussed the procedure including the risks, benefits and alternatives for the proposed anesthesia with the patient or authorized representative who has indicated his/her understanding and acceptance.   Dental advisory given  Plan Discussed with: CRNA and Surgeon  Anesthesia Plan Comments:         Anesthesia Quick Evaluation

## 2013-04-18 ENCOUNTER — Encounter (HOSPITAL_COMMUNITY)
Admission: RE | Disposition: A | Discharge status: Home / Self Care | Payer: Self-pay | Source: Ambulatory Visit | Attending: Internal Medicine

## 2013-04-18 ENCOUNTER — Ambulatory Visit (HOSPITAL_COMMUNITY)
Admission: RE | Admit: 2013-04-18 | Discharge: 2013-04-18 | Disposition: A | Discharge status: Home / Self Care | Payer: Medicare PPO | Source: Ambulatory Visit | Attending: Internal Medicine | Admitting: Internal Medicine

## 2013-04-18 ENCOUNTER — Encounter (HOSPITAL_COMMUNITY): Payer: Self-pay | Admitting: *Deleted

## 2013-04-18 ENCOUNTER — Encounter (HOSPITAL_COMMUNITY): Payer: Medicare PPO | Admitting: Anesthesiology

## 2013-04-18 ENCOUNTER — Ambulatory Visit (HOSPITAL_COMMUNITY): Payer: Medicare PPO | Admitting: Anesthesiology

## 2013-04-18 DIAGNOSIS — E785 Hyperlipidemia, unspecified: Secondary | ICD-10-CM | POA: Insufficient documentation

## 2013-04-18 DIAGNOSIS — Z9049 Acquired absence of other specified parts of digestive tract: Secondary | ICD-10-CM

## 2013-04-18 DIAGNOSIS — I2589 Other forms of chronic ischemic heart disease: Secondary | ICD-10-CM | POA: Insufficient documentation

## 2013-04-18 DIAGNOSIS — K219 Gastro-esophageal reflux disease without esophagitis: Secondary | ICD-10-CM | POA: Insufficient documentation

## 2013-04-18 DIAGNOSIS — Z1211 Encounter for screening for malignant neoplasm of colon: Secondary | ICD-10-CM | POA: Insufficient documentation

## 2013-04-18 DIAGNOSIS — D126 Benign neoplasm of colon, unspecified: Secondary | ICD-10-CM

## 2013-04-18 DIAGNOSIS — I10 Essential (primary) hypertension: Secondary | ICD-10-CM | POA: Insufficient documentation

## 2013-04-18 DIAGNOSIS — Z860101 Personal history of adenomatous and serrated colon polyps: Secondary | ICD-10-CM

## 2013-04-18 DIAGNOSIS — I252 Old myocardial infarction: Secondary | ICD-10-CM | POA: Insufficient documentation

## 2013-04-18 DIAGNOSIS — Z95810 Presence of automatic (implantable) cardiac defibrillator: Secondary | ICD-10-CM | POA: Insufficient documentation

## 2013-04-18 DIAGNOSIS — Z860100 Personal history of colon polyps, unspecified: Secondary | ICD-10-CM

## 2013-04-18 DIAGNOSIS — K635 Polyp of colon: Secondary | ICD-10-CM

## 2013-04-18 DIAGNOSIS — Z951 Presence of aortocoronary bypass graft: Secondary | ICD-10-CM | POA: Insufficient documentation

## 2013-04-18 DIAGNOSIS — I2510 Atherosclerotic heart disease of native coronary artery without angina pectoris: Secondary | ICD-10-CM | POA: Insufficient documentation

## 2013-04-18 DIAGNOSIS — Z85038 Personal history of other malignant neoplasm of large intestine: Secondary | ICD-10-CM

## 2013-04-18 DIAGNOSIS — Z980 Intestinal bypass and anastomosis status: Secondary | ICD-10-CM | POA: Insufficient documentation

## 2013-04-18 HISTORY — PX: COLONOSCOPY: SHX5424

## 2013-04-18 LAB — HEMOGLOBIN AND HEMATOCRIT, BLOOD
HCT: 34.1 % — ABNORMAL LOW (ref 39.0–52.0)
Hemoglobin: 10.9 g/dL — ABNORMAL LOW (ref 13.0–17.0)

## 2013-04-18 SURGERY — COLONOSCOPY
Anesthesia: Monitor Anesthesia Care

## 2013-04-18 MED ORDER — PROPOFOL 10 MG/ML IV BOLUS
INTRAVENOUS | Status: AC
  Filled 2013-04-18: qty 20

## 2013-04-18 MED ORDER — PROPOFOL 10 MG/ML IV BOLUS
INTRAVENOUS | Status: DC | PRN
  Administered 2013-04-18: 40 mg via INTRAVENOUS

## 2013-04-18 MED ORDER — LACTATED RINGERS IV SOLN
INTRAVENOUS | Status: DC | PRN
  Administered 2013-04-18: 08:00:00 via INTRAVENOUS

## 2013-04-18 MED ORDER — PROPOFOL INFUSION 10 MG/ML OPTIME
INTRAVENOUS | Status: DC | PRN
  Administered 2013-04-18: 140 ug/kg/min via INTRAVENOUS

## 2013-04-18 MED ORDER — SODIUM CHLORIDE 0.9 % IV SOLN: INTRAVENOUS | Status: DC

## 2013-04-18 MED ORDER — PROMETHAZINE HCL 25 MG/ML IJ SOLN: 6.2500 mg | INTRAMUSCULAR | Status: DC | PRN

## 2013-04-18 NOTE — Discharge Instructions (Addendum)
Colonoscopy Care After These instructions give you information on caring for yourself after your procedure. Your doctor may also give you more specific instructions. Call your doctor if you have any problems or questions after your procedure. HOME CARE  Take it easy for the next 24 hours.  Rest.  Walk or use warm packs on your belly (abdomen) if you have belly cramping or gas.  Do not drive for 24 hours.  You may shower.  Do not sign important papers or use machinery for 24 hours.  Drink enough fluids to keep your pee (urine) clear or pale yellow.  Resume your normal diet. Avoid heavy or fried foods.  Avoid alcohol.  Continue taking your normal medicines.  Only take medicine as told by your doctor. Do not take aspirin. If you had growths (polyps) removed:  Do not take aspirin.  Do not drink alcohol for 7 days or as told by your doctor.  Eat a soft diet for 24 hours. GET HELP RIGHT AWAY IF:  You have a fever.  You pass clumps of tissue (blood clots) or fill the toilet with blood.  You have belly pain that gets worse and medicine does not help.  Your belly is puffy (swollen).  You feel sick to your stomach (nauseous) or throw up (vomit). MAKE SURE YOU:  Understand these instructions.  Will watch your condition.  Will get help right away if you are not doing well or get worse. Document Released: 04/18/2010 Document Revised: 06/08/2011 Document Reviewed: 11/21/2012 ExitCare Patient Information 2014 ExitCare, [place].  YOU HAD AN ENDOSCOPIC PROCEDURE TODAY: Refer to the procedure report that was given to you for any specific questions about what was found during the examination.  If the procedure report does not answer your questions, please call your gastroenterologist to clarify.  YOU SHOULD EXPECT: Some feelings of bloating in the abdomen. Passage of more gas than usual.  Walking can help get rid of the air that was put into your GI tract during the procedure and  reduce the bloating. If you had a lower endoscopy (such as a colonoscopy or flexible sigmoidoscopy) you may notice spotting of blood in your stool or on the toilet paper.   DIET: Your first meal following the procedure should be a light meal and then it is ok to progress to your normal diet.  A half-sandwich or bowl of soup is an example of a good first meal.  Heavy or fried foods are harder to digest and may make you feel nasueas or bloated.  Drink plenty of fluids but you should avoid alcoholic beverages for 24 hours.  ACTIVITY: Your care partner should take you home directly after the procedure.  You should plan to take it easy, moving slowly for the rest of the day.  You can resume normal activity the day after the procedure however you should NOT DRIVE or use heavy machinery for 24 hours (because of the sedation medicines used during the test).    SYMPTOMS TO REPORT IMMEDIATELY  A gastroenterologist can be reached at any hour.  Please call your doctor's office for any of the following symptoms:   Following lower endoscopy (colonoscopy, flexible sigmoidoscopy)  Excessive amounts of blood in the stool  Significant tenderness, worsening of abdominal pains  Swelling of the abdomen that is new, acute  Fever of 100 or higher  Following upper endoscopy (EGD, EUS, ERCP)  Vomiting of blood or coffee ground material  New, significant abdominal pain  New, significant chest pain  or pain under the shoulder blades  Painful or persistently difficult swallowing  New shortness of breath  Black, tarry-looking stools  FOLLOW UP: If any biopsies were taken you will be contacted by phone or by letter within the next 1-3 weeks.  Call your gastroenterologist if you have not heard about the biopsies in 3 weeks.  Please also call your gastroenterologist's office with any specific questions about appointments or follow up tests.

## 2013-04-18 NOTE — H&P (Signed)
HPI: Mark Clayton is a 73 year old man with a past medical history of ischemic cardiomyopathy status post CABG and ICD placement, hypertension, hyperlipidemia, sigmoid colon cancer status post resection, and subsequent large right colon adenoma status post right hemicolectomy who is here for surveillance colonoscopy.  No new complaints.  Tolerated prep well.  Is hoping I don't see or find additional colon polyps today.  Denies abdominal pain. No change in bowel habits, rectal bleeding or melena.   No cp or dyspnea today.  No LE edema of late.  Past Medical History  Diagnosis Date  . Ischemic cardiomyopathy     proir bypass. EF 25%. (Master study EF >20%)  . Carotid bruit   . Hyperlipidemia   . CAD (coronary artery disease)     severe 3 vessel CAD w/unstable angina and acute MI  . HTN (hypertension)     systolic  . GERD (gastroesophageal reflux disease) 02-06-11    reflux is controlled-Omeprazole  . Colon cancer 02-06-11     Sigmoid  . Anemia 02-06-11    takes oral iron  . ICD (implantable cardiac defibrillator) in place   . Pacemaker   . CHF (congestive heart failure)   . H/O hiatal hernia   . Cough   . Myocardial infarct     '03-MI    Past Surgical History  Procedure Laterality Date  . Cardiac defibrillator placement  01/17/03    6949 lead. medtronic. remote-no; with later revision  . Inguinal hernia repair      right  . Coronary artery bypass graft  02-06-11    11'03 6 vessel bypass  . Cataract extraction w/ intraocular lens  implant, bilateral  02-06-11    '09-june/ july-Dr. Katy Fitch  . Colon resection  02/09/2011    Procedure: LAPAROSCOPIC SIGMOID COLON RESECTION;  Surgeon: Pedro Earls, MD;  Location: [facility];  Service: General;  Laterality: N/A;  Laparoscopic Assisted Sigmoid Colectomy  . Colonoscopy  08/31/2011    Procedure: COLONOSCOPY;  Surgeon: Jerene Bears, MD;  Location: [facility];  Service: Gastroenterology;  Laterality: N/A;  . Colonoscopy N/A 09/05/2012   Procedure: COLONOSCOPY;  Surgeon: Jerene Bears, MD;  Location: [facility];  Service: Gastroenterology;  Laterality: N/A;  . Laparoscopic right hemi colectomy N/A 11/04/2012    Procedure: LAPAROSCOPIC RIGHT HEMI COLECTOMY;  Surgeon: Pedro Earls, MD;  Location: [facility];  Service: General;  Laterality: N/A;    Current Facility-Administered Medications  Medication Dose Route Frequency Provider Last Rate Last Dose  . 0.9 %  sodium chloride infusion   Intravenous Continuous Jerene Bears, MD      . promethazine (PHENERGAN) injection 6.25-12.5 mg  6.25-12.5 mg Intravenous Q15 min PRN Myrtie Soman, MD        Allergies  Allergen Reactions  . Latex Rash  . Tape Rash    USE PAPER    Family History  Problem Relation Age of Onset  . Hypertension Father   . Hyperlipidemia Father   . Heart disease Father   . Prostate cancer Father   . Hypertension Sister   . Hyperlipidemia Sister   . Alzheimer's disease Mother   . Colon cancer Neg Hx     History  Substance Use Topics  . Smoking status: Never Smoker   . Smokeless tobacco: Never Used     Comment: tobacco use - no  . Alcohol Use: 4.2 oz/week    7 Cans of beer per week     Comment: daily beer  ROS: As per history of present illness, otherwise negative  BP 147/87  Pulse 60  Temp(Src) 97.9 F (36.6 C) (Oral)  Resp 17 Gen: awake, alert, NAD HEENT: anicteric, op clear CV: RRR Pulm: CTA b/l Abd: soft, NT/ND, +BS throughout Ext: no c/c/e Neuro: nonfocal  RELEVANT LABS AND IMAGING: CBC    Component Value Date/Time   WBC 5.6 11/24/2012 1045   RBC 4.18* 11/24/2012 1045   HGB 10.9* 04/18/2013 0725   HCT 34.1* 04/18/2013 0725   PLT 300 11/24/2012 1045   MCV 82.8 11/24/2012 1045   MCH 27.0 11/24/2012 1045   MCHC 32.7 11/24/2012 1045   RDW 15.4 11/24/2012 1045   LYMPHSABS 1.3 11/24/2012 1045   MONOABS 0.6 11/24/2012 1045   EOSABS 0.2 11/24/2012 1045   BASOSABS 0.1 11/24/2012 1045    CMP     Component Value Date/Time   NA 136  11/14/2012 0440   K 3.0* 11/14/2012 0440   CL 102 11/14/2012 0440   CO2 24 11/14/2012 0440   GLUCOSE 115* 11/14/2012 0440   BUN <3* 11/14/2012 0440   CREATININE 0.79 11/14/2012 0440   CALCIUM 9.1 11/14/2012 0440   PROT 6.5 09/06/2012 1245   ALBUMIN 3.5 09/06/2012 1245   AST 19 09/06/2012 1245   ALT 13 09/06/2012 1245   ALKPHOS 42 09/06/2012 1245   BILITOT 1.1 09/06/2012 1245   GFRNONAA 88* 11/14/2012 0440   GFRAA >90 11/14/2012 0440    ASSESSMENT/PLAN: 73 year old man with a past medical history of ischemic cardiomyopathy status post CABG and ICD placement, hypertension, hyperlipidemia, sigmoid colon cancer status post resection, and subsequent large right colon adenoma status post right hemicolectomy who is here for surveillance colonoscopy.  1.  Hx of colon cancer/adenomatous colon polyps/high risk colonoscopic surveillance -- repeat surveillance colonoscopy planned for today. He is a high risk for colon polyps given his history.  Procedure with MAC.  The nature of the procedure, as well as the risks, benefits, and alternatives were carefully and thoroughly reviewed with the patient. Ample time for discussion and questions allowed. The patient understood, was satisfied, and agreed to proceed.

## 2013-04-18 NOTE — Anesthesia Postprocedure Evaluation (Signed)
  Anesthesia Post-op Note  Patient: Mark Clayton  Procedure(s) Performed: Procedure(s) (LRB): COLONOSCOPY (N/A)  Patient Location: PACU  Anesthesia Type: MAC  Level of Consciousness: awake and alert   Airway and Oxygen Therapy: Patient Spontanous Breathing  Post-op Pain: mild  Post-op Assessment: Post-op Vital signs reviewed, Patient's Cardiovascular Status Stable, Respiratory Function Stable, Patent Airway and No signs of Nausea or vomiting  Last Vitals:  Filed Vitals:   04/18/13 0920  BP: 148/85  Pulse:   Temp:   Resp: 11    Post-op Vital Signs: stable   Complications: No apparent anesthesia complications

## 2013-04-18 NOTE — Transfer of Care (Signed)
Immediate Anesthesia Transfer of Care Note  Patient: Mark Clayton  Procedure(s) Performed: Procedure(s): COLONOSCOPY (N/A)  Patient Location: PACU  Anesthesia Type:MAC  Level of Consciousness: awake and alert   Airway & Oxygen Therapy: Patient Spontanous Breathing and Patient connected to face mask oxygen  Post-op Assessment: Report given to PACU RN and Post -op Vital signs reviewed and stable  Post vital signs: Reviewed and stable  Complications: No apparent anesthesia complications

## 2013-04-18 NOTE — Op Note (Signed)
[facility] [place], [address]   COLONOSCOPY PROCEDURE REPORT  PATIENT: Mark, Clayton  MR#: [id number] BIRTHDATE: 08-21-1940 , 72  yrs. old GENDER: Male ENDOSCOPIST: Jerene Bears, MD REFERRED BY: PROCEDURE DATE:  04/18/2013 PROCEDURE:   Colonoscopy with snare polypectomy and Colonoscopy with cold biopsy polypectomy First Screening Colonoscopy - Avg.  risk and is 50 yrs.  old or older - No.  Prior Negative Screening - Now for repeat screening. N/A  History of Adenoma - Now for follow-up colonoscopy & has been > or = to 3 yrs.  N/A  Polyps Removed Today? Yes. ASA CLASS:   Class III INDICATIONS:High risk patient with personal history of colon cancer, Patient's personal history of adenomatous colon polyps, and elevated risk screening. MEDICATIONS: MAC sedation, administered by CRNA and See Anesthesia Report.  DESCRIPTION OF PROCEDURE:   After the risks benefits and alternatives of the procedure were thoroughly explained, informed consent was obtained.  A digital rectal exam revealed no rectal mass.   The Pentax Adult Colonscope [id number]  endoscope was introduced through the anus and advanced to the neo-terminal ileum which was intubated for a short distance. No adverse events experienced.   The quality of the prep was good, using MoviPrep The instrument was then slowly withdrawn as the colon was fully examined.   COLON FINDINGS: There was evidence of a normal and healthy appearing prior surgical ileocolonic anastomosis in the right colon.   Two sessile polyps ranging between 3-58mm in size were found in the descending colon.  Polypectomy was performed using cold snare (1) and with cold forceps (1).  All resections were complete and all polyp tissue was completely retrieved.   There was evidence of a normal, healthy, appearing prior surgical colocolonic anastomosis in the rectosigmoid colon.  Retroflexed views revealed no abnormalities. The time  to cecum=5 minutes 0 seconds.  Withdrawal time=26 minutes 0 seconds.  The scope was withdrawn and the procedure completed. COMPLICATIONS: There were no complications.  ENDOSCOPIC IMPRESSION: 1.   There was evidence of prior surgical ileocolonic anastomosis in the right colon 2.   Two sessile polyps ranging between 3-46mm in size were found in the descending colon; Polypectomy was performed using cold snare and with cold forceps 3.   There was evidence of prior surgical colocolonic anastomosis in the rectosigmoid colon  RECOMMENDATIONS: 1.  Await pathology results 2.  Repeat Colonoscopy in 1 year. 3.  You will receive a letter within 1-2 weeks with the results of your biopsy as well as final recommendations.  Please call my office if you have not received a letter after 3 weeks.   eSigned:  Jerene Bears, MD 04/18/2013 8:57 AM   cc: The Patient and Johnathan Hausen, MD   PATIENT NAME:  Mark, Clayton MR#: [id number]

## 2013-04-19 ENCOUNTER — Encounter (HOSPITAL_COMMUNITY): Payer: Self-pay | Admitting: Internal Medicine

## 2013-04-21 ENCOUNTER — Encounter: Payer: Self-pay | Admitting: Internal Medicine

## 2013-05-04 ENCOUNTER — Ambulatory Visit (INDEPENDENT_AMBULATORY_CARE_PROVIDER_SITE_OTHER): Payer: Commercial Managed Care - HMO | Admitting: *Deleted

## 2013-05-04 ENCOUNTER — Encounter: Payer: Self-pay | Admitting: Internal Medicine

## 2013-05-04 ENCOUNTER — Telehealth: Payer: Self-pay | Admitting: Internal Medicine

## 2013-05-04 DIAGNOSIS — I509 Heart failure, unspecified: Secondary | ICD-10-CM | POA: Diagnosis not present

## 2013-05-04 DIAGNOSIS — Z95810 Presence of automatic (implantable) cardiac defibrillator: Secondary | ICD-10-CM

## 2013-05-04 DIAGNOSIS — I2589 Other forms of chronic ischemic heart disease: Secondary | ICD-10-CM | POA: Diagnosis not present

## 2013-05-04 LAB — MDC_IDC_ENUM_SESS_TYPE_INCLINIC
Battery Voltage: 3.09 V
Brady Statistic RV Percent Paced: 0.05 %
Date Time Interrogation Session: 20150205183315
HighPow Impedance: 43 Ohm
HighPow Impedance: 56 Ohm
Lead Channel Impedance Value: 532 Ohm
Lead Channel Sensing Intrinsic Amplitude: 31.625 mV
Lead Channel Sensing Intrinsic Amplitude: 31.625 mV
Lead Channel Setting Pacing Amplitude: 2.5 V
Lead Channel Setting Pacing Pulse Width: 0.8 ms
Lead Channel Setting Sensing Sensitivity: 0.45 mV
Zone Setting Detection Interval: 300 ms
Zone Setting Detection Interval: 370 ms
Zone Setting Detection Interval: 370 ms

## 2013-05-04 NOTE — Progress Notes (Signed)
Pt seen today (instead of 05/08/13) due to device tones---pt's money clip in his shirt pocket was magnetizing his device.   ICD check in clinic. Normal device function. Thresholds and sensing consistent with previous device measurements. Impedance trends stable over time. 2 NSVT---both 2 sec. Histogram distribution appropriate for patient and level of activity. OptiVol up/ongoing since 04/25/13. No changes made this session. Device programmed at appropriate safety margins. Device programmed to optimize intrinsic conduction. Battery voltage @ 3.09V. Alert tones demonstrated for pt, pt knows to call clinic if heard.   ROV w/ Dr. Caryl Comes 08/03/13 @ 4:00.

## 2013-05-04 NOTE — Telephone Encounter (Signed)
New message    Device is whistling at pt.  His battery is near the end.  He has an appt on Monday.  However, will he need to have it resc and come in tomorrow?

## 2013-05-05 NOTE — Telephone Encounter (Signed)
Brought pt in 05/04/13. See device clinic note on 05/04/13.

## 2013-08-03 ENCOUNTER — Encounter: Payer: Self-pay | Admitting: Internal Medicine

## 2013-08-03 ENCOUNTER — Ambulatory Visit (INDEPENDENT_AMBULATORY_CARE_PROVIDER_SITE_OTHER): Payer: Medicare PPO | Admitting: Internal Medicine

## 2013-08-03 VITALS — BP 150/90 | HR 75 | Resp 18 | Ht 71.0 in | Wt 172.0 lb

## 2013-08-03 DIAGNOSIS — I5022 Chronic systolic (congestive) heart failure: Secondary | ICD-10-CM

## 2013-08-03 DIAGNOSIS — I255 Ischemic cardiomyopathy: Secondary | ICD-10-CM

## 2013-08-03 DIAGNOSIS — Z95810 Presence of automatic (implantable) cardiac defibrillator: Secondary | ICD-10-CM

## 2013-08-03 DIAGNOSIS — I2589 Other forms of chronic ischemic heart disease: Secondary | ICD-10-CM

## 2013-08-03 DIAGNOSIS — I509 Heart failure, unspecified: Secondary | ICD-10-CM

## 2013-08-03 LAB — MDC_IDC_ENUM_SESS_TYPE_INCLINIC
Battery Voltage: 3.07 V
Brady Statistic RV Percent Paced: 0.02 %
Date Time Interrogation Session: 20150507170722
HighPow Impedance: 41 Ohm
HighPow Impedance: 52 Ohm
Lead Channel Impedance Value: 494 Ohm
Lead Channel Pacing Threshold Amplitude: 0.75 V
Lead Channel Pacing Threshold Pulse Width: 0.8 ms
Lead Channel Sensing Intrinsic Amplitude: 31.625 mV
Lead Channel Setting Pacing Amplitude: 2.5 V
Lead Channel Setting Pacing Pulse Width: 0.8 ms
Lead Channel Setting Sensing Sensitivity: 0.45 mV
Zone Setting Detection Interval: 300 ms
Zone Setting Detection Interval: 370 ms
Zone Setting Detection Interval: 370 ms

## 2013-08-03 MED ORDER — ISOSORBIDE MONONITRATE ER 30 MG PO TB24: 30.0000 mg | ORAL_TABLET | Freq: Every day | ORAL | Status: DC

## 2013-08-03 MED ORDER — FUROSEMIDE 40 MG PO TABS: 40.0000 mg | ORAL_TABLET | Freq: Every day | ORAL | Status: DC

## 2013-08-03 MED ORDER — LOSARTAN POTASSIUM 50 MG PO TABS: 50.0000 mg | ORAL_TABLET | Freq: Every day | ORAL | Status: DC

## 2013-08-03 NOTE — Progress Notes (Signed)
Patient Care Team: Mark Greaser, DO as PCP - General (Family Medicine)   HPI  Mark Clayton is a 73 y.o. male Seen in followup for an ICD implanted for primary prevention. He has 5949-lead which was replaced with a right ventricular pace sense lead at the time of generator replacement. Stress testing January 2012 demonstrated mild periinfarct ischemia.  EF 30%    Echo 10/14  EF 20%  there was also RV dysfunction and significant LA enlargement (53/2.7)  He also has a history of an apical clot identified in 2009. Warfarin was discontinued about a year ago. He was on aspirin buthtis was stopped 2/2 ocular injections for macular degeneration  He has been having increasing sob x 73mon with some edema  no chest pain  Prev on ACE stopped 2/2 cough  Not ARB   Past Medical History  Diagnosis Date  . Ischemic cardiomyopathy     proir bypass. EF 25%. (Master study EF >20%)  . Carotid bruit   . Hyperlipidemia   . CAD (coronary artery disease)     severe 3 vessel CAD w/unstable angina and acute MI  . HTN (hypertension)     systolic  . GERD (gastroesophageal reflux disease) 02-06-11    reflux is controlled-Omeprazole  . Colon cancer 02-06-11     Sigmoid  . Anemia 02-06-11    takes oral iron  . ICD (implantable cardiac defibrillator) in place   . Pacemaker   . CHF (congestive heart failure)   . H/O hiatal hernia   . Cough   . Myocardial infarct     '03-MI    Past Surgical History  Procedure Laterality Date  . Cardiac defibrillator placement  01/17/03    6949 lead. medtronic. remote-no; with later revision  . Inguinal hernia repair      right  . Coronary artery bypass graft  02-06-11    11'03 6 vessel bypass  . Cataract extraction w/ intraocular lens  implant, bilateral  02-06-11    '09-june/ july-Dr. Katy Fitch  . Colon resection  02/09/2011    Procedure: LAPAROSCOPIC SIGMOID COLON RESECTION;  Surgeon: Pedro Earls, MD;  Location: [facility];  Service: General;  Laterality:  N/A;  Laparoscopic Assisted Sigmoid Colectomy  . Colonoscopy  08/31/2011    Procedure: COLONOSCOPY;  Surgeon: Jerene Bears, MD;  Location: [facility];  Service: Gastroenterology;  Laterality: N/A;  . Colonoscopy N/A 09/05/2012    Procedure: COLONOSCOPY;  Surgeon: Jerene Bears, MD;  Location: [facility];  Service: Gastroenterology;  Laterality: N/A;  . Laparoscopic right hemi colectomy N/A 11/04/2012    Procedure: LAPAROSCOPIC RIGHT HEMI COLECTOMY;  Surgeon: Pedro Earls, MD;  Location: [facility];  Service: General;  Laterality: N/A;  . Colonoscopy N/A 04/18/2013    Procedure: COLONOSCOPY;  Surgeon: Jerene Bears, MD;  Location: [facility];  Service: Gastroenterology;  Laterality: N/A;    Current Outpatient Prescriptions  Medication Sig Dispense Refill  . Acetaminophen 500 MG coapsule as needed.      . carvedilol (COREG) 12.5 MG tablet Take 12.5 mg by mouth every evening.      . carvedilol (COREG) 6.25 MG tablet Take 6.25 mg by mouth every morning.      . furosemide (LASIX) 20 MG tablet Take 20 mg by mouth every morning.      Marland Kitchen ibuprofen (ADVIL,MOTRIN) 200 MG tablet Take 200 mg by mouth every 6 (six) hours as needed for pain.      . meclizine (  ANTIVERT) 25 MG tablet       . nitroGLYCERIN (NITROSTAT) 0.4 MG SL tablet Place 0.4 mg under the tongue every 5 (five) minutes as needed. Chest pain         . omeprazole (PRILOSEC) 20 MG capsule Take 1 capsule (20 mg total) by mouth daily.  90 capsule  3  . rosuvastatin (CRESTOR) 5 MG tablet Take 5 mg by mouth at bedtime.      Marland Kitchen aspirin 81 MG tablet Take 81 mg by mouth 4 (four) times a week.       Marland Kitchen [place] 10-3.5-12 MG-GM-GM PACK Patient states he already has this at home, was given by Dr..Pyrtle during office visit  1 each  0   No current facility-administered medications for this visit.    Allergies  Allergen Reactions  . Latex Rash  . Tape Rash    USE PAPER    Review of Systems negative except from HPI and PMH  Physical Exam BP  150/90  Pulse 75  Resp 18  Ht 5\' 11"  (1.803 m)  Wt 172 lb (78.019 kg)  BMI 24.00 kg/m2  SpO2 97% Well developed and well nourished in no acute distress HENT normal E scleral and icterus clear Neck Supple JVP8-10 carotids brisk and full Clear to ausculation  Regular rate and rhythm, 2/6 murmur  Soft with active bowel sounds No clubbing cyanosis 1+ Edema Alert and oriented, grossly normal motor and sensory function Skin Warm and Dry    Assessment and  Plan  CHF- acute systolic  Ischemic Cardiomyopathy  ICD  The patient's device was interrogated.  The information was reviewed. No changes were made in the programming.    Hypertension  Will begin imdur and losartan for BP and CHF and possible ischemia Will increase lasix  He prob needs cath but will need to get permission from Mark Clayton who has held his ASA 2/2 injections

## 2013-08-03 NOTE — Patient Instructions (Signed)
START TAKING COZAAR 50 MG DAILY  INCREASE YOUR LASIX TO 40 MG DAILY  START TAKING IMDUR 30 MG DAILY  Your physician has requested that you have a cardiac catheterization. Cardiac catheterization is used to diagnose and/or treat various heart conditions. Doctors may recommend this procedure for a number of different reasons. The most common reason is to evaluate chest pain. Chest pain can be a symptom of coronary artery disease (CAD), and cardiac catheterization can show whether plaque is narrowing or blocking your heart's arteries. This procedure is also used to evaluate the valves, as well as measure the blood flow and oxygen levels in different parts of your heart. For further information please visit [email or web address]. Please follow instruction sheet, as given.  WE WILL CONTACT YOUR EYE DR Zadie Rhine TO GET CLEARANCE FOR CARDIAC CATH. WE WILL FOLLOW-UP WITH YOU ON DR Zadie Rhine RECOMMENDATIONS TO SCHEDULE THIS

## 2013-08-08 ENCOUNTER — Telehealth: Payer: Self-pay | Admitting: Internal Medicine

## 2013-08-08 NOTE — Telephone Encounter (Addendum)
New message     Waiting to hear from [facility].  Have we gotten the clearance from his eye doctor so that pt can have his cath?

## 2013-08-09 NOTE — Telephone Encounter (Signed)
The patient wants Dr. Caryl Comes to know that the medications he started have performed miracles, he is feeling wonderful!! His equilibrium isn't bothering him anymore either. Informed patient that Dr. Caryl Comes hasn't spoken with Dr. Zadie Rhine yet and that I would send message to Dr. Caryl Comes to address.  Pt asking if he still needs this procedure now that he has such a big improvement, but states that he will do whatever Dr. Caryl Comes thinks is best.  Will forward to Dr. Caryl Comes to review.

## 2013-08-10 ENCOUNTER — Encounter: Payer: Self-pay | Admitting: Internal Medicine

## 2013-08-16 ENCOUNTER — Other Ambulatory Visit: Payer: Self-pay | Admitting: *Deleted

## 2013-08-16 DIAGNOSIS — Z79899 Other long term (current) drug therapy: Secondary | ICD-10-CM

## 2013-08-17 NOTE — Telephone Encounter (Signed)
Informed pt that we will not pursue cath at this time. Pt also informed me that he spoke with Dr. Zadie Rhine stated that from his standpoint cath would be ok if needed.

## 2013-08-25 ENCOUNTER — Other Ambulatory Visit (INDEPENDENT_AMBULATORY_CARE_PROVIDER_SITE_OTHER): Payer: Medicare PPO

## 2013-08-25 DIAGNOSIS — Z79899 Other long term (current) drug therapy: Secondary | ICD-10-CM

## 2013-08-25 LAB — BASIC METABOLIC PANEL
BUN: 10 mg/dL (ref 6–23)
CO2: 30 mEq/L (ref 19–32)
Calcium: 9.5 mg/dL (ref 8.4–10.5)
Chloride: 105 mEq/L (ref 96–112)
Creatinine, Ser: 1.1 mg/dL (ref 0.4–1.5)
GFR: 69.8 mL/min (ref >60.00)
Glucose, Bld: 103 mg/dL — ABNORMAL HIGH (ref 70–99)
Potassium: 3.6 mEq/L (ref 3.5–5.1)
Sodium: 140 mEq/L (ref 135–145)

## 2013-10-06 ENCOUNTER — Other Ambulatory Visit: Payer: Self-pay | Admitting: *Deleted

## 2013-10-06 DIAGNOSIS — Z95810 Presence of automatic (implantable) cardiac defibrillator: Secondary | ICD-10-CM

## 2013-10-06 DIAGNOSIS — I5022 Chronic systolic (congestive) heart failure: Secondary | ICD-10-CM

## 2013-10-06 DIAGNOSIS — I255 Ischemic cardiomyopathy: Secondary | ICD-10-CM

## 2013-10-06 MED ORDER — FUROSEMIDE 40 MG PO TABS: 40.0000 mg | ORAL_TABLET | Freq: Every day | ORAL | Status: DC

## 2013-10-06 MED ORDER — LOSARTAN POTASSIUM 50 MG PO TABS: 50.0000 mg | ORAL_TABLET | Freq: Every day | ORAL | Status: DC

## 2013-10-06 MED ORDER — ISOSORBIDE MONONITRATE ER 30 MG PO TB24: 30.0000 mg | ORAL_TABLET | Freq: Every day | ORAL | Status: DC

## 2013-11-06 ENCOUNTER — Ambulatory Visit (INDEPENDENT_AMBULATORY_CARE_PROVIDER_SITE_OTHER): Payer: Medicare PPO | Admitting: *Deleted

## 2013-11-06 DIAGNOSIS — I428 Other cardiomyopathies: Secondary | ICD-10-CM

## 2013-11-06 DIAGNOSIS — I509 Heart failure, unspecified: Secondary | ICD-10-CM

## 2013-11-06 LAB — MDC_IDC_ENUM_SESS_TYPE_INCLINIC
Battery Voltage: 3.08 V
Brady Statistic RV Percent Paced: 0.08 %
Date Time Interrogation Session: 20150810105641
HighPow Impedance: 40 Ohm
HighPow Impedance: 51 Ohm
Lead Channel Impedance Value: 494 Ohm
Lead Channel Sensing Intrinsic Amplitude: 31.625 mV
Lead Channel Sensing Intrinsic Amplitude: 31.625 mV
Lead Channel Setting Pacing Amplitude: 2.5 V
Lead Channel Setting Pacing Pulse Width: 0.8 ms
Lead Channel Setting Sensing Sensitivity: 0.45 mV
Zone Setting Detection Interval: 300 ms
Zone Setting Detection Interval: 370 ms
Zone Setting Detection Interval: 370 ms

## 2013-11-06 NOTE — Progress Notes (Signed)
ICD check in clinic. Normal device function. Threshold and sensing consistent with previous device measurements. Impedance trends stable over time. 1 NSVT---13 beats. Histogram distribution appropriate for patient and level of activity. OptiVol stable. No changes made this session. Device programmed at appropriate safety margins. Device programmed to optimize intrinsic conduction. Battery @3 .08V. ROV w/ device clinic 02/12/14 & w/ Dr. Caryl Comes May/2016.

## 2013-11-23 ENCOUNTER — Encounter: Payer: Self-pay | Admitting: Internal Medicine

## 2014-01-19 ENCOUNTER — Telehealth: Payer: Self-pay | Admitting: *Deleted

## 2014-01-19 ENCOUNTER — Ambulatory Visit: Payer: Medicare PPO | Admitting: *Deleted

## 2014-01-19 DIAGNOSIS — T827XXA Infection and inflammatory reaction due to other cardiac and vascular devices, implants and grafts, initial encounter: Secondary | ICD-10-CM

## 2014-01-19 NOTE — Telephone Encounter (Signed)
Pt c/o intermittent burning sensation at device pocket. Pt feeling weak. No fever. Made appt w/ device clinic today at 3:30.

## 2014-01-19 NOTE — Progress Notes (Signed)
Pt having intermittent burning sensations inside of device pocket. Pt experiencing occasional weakness along with burning sensations. No discoloration on appearance. No change in temperature w/ touch. Device not interrogated. Pt implanted 12/10/2008. Blood cultures ordered per Dr. Caryl Comes. Pt will return 01/22/14 for labs.

## 2014-01-22 ENCOUNTER — Other Ambulatory Visit: Payer: Medicare PPO | Admitting: *Deleted

## 2014-01-22 DIAGNOSIS — T827XXA Infection and inflammatory reaction due to other cardiac and vascular devices, implants and grafts, initial encounter: Secondary | ICD-10-CM

## 2014-01-25 ENCOUNTER — Telehealth: Payer: Self-pay | Admitting: Internal Medicine

## 2014-01-25 DIAGNOSIS — Z85038 Personal history of other malignant neoplasm of large intestine: Secondary | ICD-10-CM

## 2014-01-28 LAB — CULTURE, BLOOD (SINGLE): Organism ID, Bacteria: NO GROWTH

## 2014-01-30 ENCOUNTER — Other Ambulatory Visit: Payer: Self-pay

## 2014-01-30 DIAGNOSIS — Z85038 Personal history of other malignant neoplasm of large intestine: Secondary | ICD-10-CM

## 2014-01-30 NOTE — Telephone Encounter (Signed)
Pt needs colon done in January at the hospital and he is requesting a Monday appt. Would Monday January 11th be ok around lunch time? I can block your 11:30am in the [place] if that will work for you. Please advise.

## 2014-01-30 NOTE — Telephone Encounter (Signed)
Yes, please block the 130 pm appt as well in case there is hospital delay Thanks

## 2014-01-31 NOTE — Telephone Encounter (Signed)
Pt scheduled for previsit 03/16/14@9am , colon scheduled at [facility] 04/09/14@12noon , pt to arrive there at 10:30am. Pt aware of appt.

## 2014-02-12 ENCOUNTER — Ambulatory Visit (INDEPENDENT_AMBULATORY_CARE_PROVIDER_SITE_OTHER): Payer: Medicare PPO | Admitting: *Deleted

## 2014-02-12 DIAGNOSIS — I5022 Chronic systolic (congestive) heart failure: Secondary | ICD-10-CM

## 2014-02-12 DIAGNOSIS — I255 Ischemic cardiomyopathy: Secondary | ICD-10-CM

## 2014-02-12 LAB — MDC_IDC_ENUM_SESS_TYPE_INCLINIC
Battery Voltage: 3.05 V
Brady Statistic RV Percent Paced: 0.06 %
Date Time Interrogation Session: 20151116104806
HighPow Impedance: 45 Ohm
HighPow Impedance: 57 Ohm
Lead Channel Impedance Value: 532 Ohm
Lead Channel Pacing Threshold Amplitude: 1 V
Lead Channel Pacing Threshold Pulse Width: 0.8 ms
Lead Channel Sensing Intrinsic Amplitude: 31.625 mV
Lead Channel Sensing Intrinsic Amplitude: 31.625 mV
Lead Channel Setting Pacing Amplitude: 2.5 V
Lead Channel Setting Pacing Pulse Width: 0.8 ms
Lead Channel Setting Sensing Sensitivity: 0.45 mV
Zone Setting Detection Interval: 300 ms
Zone Setting Detection Interval: 370 ms
Zone Setting Detection Interval: 370 ms

## 2014-02-12 NOTE — Progress Notes (Signed)
ICD check in clinic. Normal device function. Thresholds and sensing consistent with previous device measurements. Impedance trends stable over time. 3 NSVT episodes. Histogram distribution appropriate for patient and level of activity.  Optivol and thoracic impedance normal.   No changes made this session. Device programmed at appropriate safety margins. Device programmed to optimize intrinsic conduction.  Patient education completed including shock plan. Alert tones/vibration demonstrated for patient.  ROV in February with the device clinic.

## 2014-03-06 ENCOUNTER — Encounter: Payer: Self-pay | Admitting: Internal Medicine

## 2014-03-16 ENCOUNTER — Ambulatory Visit (AMBULATORY_SURGERY_CENTER): Payer: Self-pay | Admitting: *Deleted

## 2014-03-16 ENCOUNTER — Telehealth: Payer: Self-pay | Admitting: *Deleted

## 2014-03-16 VITALS — Ht 71.0 in | Wt 173.8 lb

## 2014-03-16 DIAGNOSIS — Z85038 Personal history of other malignant neoplasm of large intestine: Secondary | ICD-10-CM

## 2014-03-16 NOTE — Telephone Encounter (Signed)
Dr. Hilarie Fredrickson,  Mark Clayton was here for his PV today.  He states he had so much trouble with the Moviprep before that he was given Prepopik with his 03-2013 procedure.  I went ahead and gave him that.  If you want me to change that, please let me know.    Thanks, [employer]

## 2014-03-16 NOTE — Progress Notes (Signed)
No egg or soy allergy  No anesthesia or intubation problems per pt  No diet medications taken   

## 2014-03-21 ENCOUNTER — Encounter (HOSPITAL_COMMUNITY): Payer: Self-pay | Admitting: *Deleted

## 2014-03-21 NOTE — Telephone Encounter (Signed)
Spoke with Dr. Hilarie Fredrickson- ok for pt to take Prepopik for his colonoscopy

## 2014-03-26 ENCOUNTER — Telehealth: Payer: Self-pay | Admitting: Internal Medicine

## 2014-03-26 NOTE — Telephone Encounter (Signed)
The pt questions have been answered and he will call back with further concerns

## 2014-03-28 ENCOUNTER — Telehealth: Payer: Self-pay | Admitting: Internal Medicine

## 2014-03-28 NOTE — Telephone Encounter (Signed)
Spoke with patient and he states he gets "sick on his stomach when taking the prep for procedure and Dr. Hilarie Fredrickson gives me pills for nausea." Please, advise.

## 2014-04-01 NOTE — Telephone Encounter (Signed)
Reglan 10 mg 30 min to 1 hour before each half of the prep

## 2014-04-02 MED ORDER — METOCLOPRAMIDE HCL 10 MG PO TABS
ORAL_TABLET | ORAL | Status: DC
Start: 2014-04-02 — End: 2014-05-14

## 2014-04-02 NOTE — Telephone Encounter (Signed)
Spoke with pt and he is aware, script sent to pharmacy. 

## 2014-04-04 ENCOUNTER — Encounter: Payer: Self-pay | Admitting: Internal Medicine

## 2014-04-05 ENCOUNTER — Other Ambulatory Visit: Payer: Self-pay | Admitting: *Deleted

## 2014-04-05 DIAGNOSIS — Z95810 Presence of automatic (implantable) cardiac defibrillator: Secondary | ICD-10-CM

## 2014-04-05 DIAGNOSIS — I255 Ischemic cardiomyopathy: Secondary | ICD-10-CM

## 2014-04-05 DIAGNOSIS — I5022 Chronic systolic (congestive) heart failure: Secondary | ICD-10-CM

## 2014-04-05 MED ORDER — FUROSEMIDE 40 MG PO TABS: 40.0000 mg | ORAL_TABLET | Freq: Every day | ORAL | Status: DC

## 2014-04-05 MED ORDER — ISOSORBIDE MONONITRATE ER 30 MG PO TB24: 30.0000 mg | ORAL_TABLET | Freq: Every day | ORAL | Status: DC

## 2014-04-05 MED ORDER — LOSARTAN POTASSIUM 50 MG PO TABS: 50.0000 mg | ORAL_TABLET | Freq: Every day | ORAL | Status: DC

## 2014-04-05 NOTE — Telephone Encounter (Signed)
Ok to refill for couple months. Patient needs to contact PCP for further refills.

## 2014-04-05 NOTE — Telephone Encounter (Signed)
Ok to refill for patient? Please advise. Thanks, MI 

## 2014-04-06 MED ORDER — OMEPRAZOLE 20 MG PO CPDR: 20.0000 mg | DELAYED_RELEASE_CAPSULE | Freq: Every day | ORAL | Status: DC

## 2014-04-09 ENCOUNTER — Ambulatory Visit (HOSPITAL_COMMUNITY)
Admission: RE | Admit: 2014-04-09 | Discharge: 2014-04-09 | Disposition: A | Discharge status: Home / Self Care | Payer: Commercial Managed Care - HMO | Source: Ambulatory Visit | Attending: Internal Medicine | Admitting: Internal Medicine

## 2014-04-09 ENCOUNTER — Ambulatory Visit (HOSPITAL_COMMUNITY): Payer: Commercial Managed Care - HMO | Admitting: Anesthesiology

## 2014-04-09 ENCOUNTER — Encounter (HOSPITAL_COMMUNITY)
Admission: RE | Disposition: A | Discharge status: Home / Self Care | Payer: Self-pay | Source: Ambulatory Visit | Attending: Internal Medicine

## 2014-04-09 ENCOUNTER — Encounter (HOSPITAL_COMMUNITY): Payer: Self-pay | Admitting: *Deleted

## 2014-04-09 DIAGNOSIS — Z91040 Latex allergy status: Secondary | ICD-10-CM | POA: Diagnosis not present

## 2014-04-09 DIAGNOSIS — Z85038 Personal history of other malignant neoplasm of large intestine: Secondary | ICD-10-CM | POA: Insufficient documentation

## 2014-04-09 DIAGNOSIS — Z8042 Family history of malignant neoplasm of prostate: Secondary | ICD-10-CM | POA: Diagnosis not present

## 2014-04-09 DIAGNOSIS — I255 Ischemic cardiomyopathy: Secondary | ICD-10-CM | POA: Insufficient documentation

## 2014-04-09 DIAGNOSIS — Z95810 Presence of automatic (implantable) cardiac defibrillator: Secondary | ICD-10-CM | POA: Diagnosis not present

## 2014-04-09 DIAGNOSIS — Z950 Presence of cardiac pacemaker: Secondary | ICD-10-CM | POA: Diagnosis not present

## 2014-04-09 DIAGNOSIS — K449 Diaphragmatic hernia without obstruction or gangrene: Secondary | ICD-10-CM | POA: Diagnosis not present

## 2014-04-09 DIAGNOSIS — I10 Essential (primary) hypertension: Secondary | ICD-10-CM | POA: Insufficient documentation

## 2014-04-09 DIAGNOSIS — E785 Hyperlipidemia, unspecified: Secondary | ICD-10-CM | POA: Insufficient documentation

## 2014-04-09 DIAGNOSIS — D124 Benign neoplasm of descending colon: Secondary | ICD-10-CM | POA: Insufficient documentation

## 2014-04-09 DIAGNOSIS — Z86010 Personal history of colonic polyps: Secondary | ICD-10-CM | POA: Diagnosis not present

## 2014-04-09 DIAGNOSIS — Z8249 Family history of ischemic heart disease and other diseases of the circulatory system: Secondary | ICD-10-CM | POA: Insufficient documentation

## 2014-04-09 DIAGNOSIS — I2510 Atherosclerotic heart disease of native coronary artery without angina pectoris: Secondary | ICD-10-CM | POA: Insufficient documentation

## 2014-04-09 DIAGNOSIS — I509 Heart failure, unspecified: Secondary | ICD-10-CM | POA: Insufficient documentation

## 2014-04-09 DIAGNOSIS — K635 Polyp of colon: Secondary | ICD-10-CM | POA: Diagnosis not present

## 2014-04-09 DIAGNOSIS — Z91048 Other nonmedicinal substance allergy status: Secondary | ICD-10-CM | POA: Diagnosis not present

## 2014-04-09 DIAGNOSIS — Z1211 Encounter for screening for malignant neoplasm of colon: Secondary | ICD-10-CM | POA: Diagnosis not present

## 2014-04-09 HISTORY — PX: COLONOSCOPY: SHX5424

## 2014-04-09 SURGERY — COLONOSCOPY
Anesthesia: Monitor Anesthesia Care

## 2014-04-09 MED ORDER — LIDOCAINE HCL (PF) 2 % IJ SOLN
INTRAMUSCULAR | Status: DC | PRN
  Administered 2014-04-09: 50 mg via INTRADERMAL

## 2014-04-09 MED ORDER — PROPOFOL 10 MG/ML IV BOLUS
INTRAVENOUS | Status: AC
  Filled 2014-04-09: qty 20

## 2014-04-09 MED ORDER — PROPOFOL 10 MG/ML IV BOLUS
INTRAVENOUS | Status: DC | PRN
  Administered 2014-04-09: 50 mg via INTRAVENOUS

## 2014-04-09 MED ORDER — LACTATED RINGERS IV SOLN
INTRAVENOUS | Status: DC
  Administered 2014-04-09: 1000 mL via INTRAVENOUS

## 2014-04-09 MED ORDER — PROPOFOL 10 MG/ML IV BOLUS
INTRAVENOUS | Status: AC
  Filled 2014-04-09: qty 60

## 2014-04-09 MED ORDER — SODIUM CHLORIDE 0.9 % IV SOLN: INTRAVENOUS | Status: DC

## 2014-04-09 MED ORDER — PROPOFOL INFUSION 10 MG/ML OPTIME
INTRAVENOUS | Status: DC | PRN
  Administered 2014-04-09: 150 ug/kg/min via INTRAVENOUS

## 2014-04-09 NOTE — H&P (Signed)
HPI: Mark Clayton is a 74 year old male with a past medical history of left-sided colon cancer status post sigmoidectomy, multiple colon polyps including large ascending colon adenoma requiring right hemicolectomy in August 2014 who presents for surveillance colonoscopy. He also has a history of ischemic cardio myopathy, CAD, hypertension, CHF, and arthritis. Asked colonoscopy was 04/18/2013, 2 polyps removed from the left colon. No new complaints today. No change in bowel habit, rectal bleeding or melena. No abdominal pain.  Past Medical History  Diagnosis Date  . Ischemic cardiomyopathy     proir bypass. EF 25%. (Master study EF >20%)  . Carotid bruit   . Hyperlipidemia   . CAD (coronary artery disease)     severe 3 vessel CAD w/unstable angina and acute MI  . HTN (hypertension)     systolic  . GERD (gastroesophageal reflux disease) 02-06-11    reflux is controlled-Omeprazole  . Colon cancer 02-06-11     Sigmoid  . Anemia 02-06-11    takes oral iron  . ICD (implantable cardiac defibrillator) in place   . Pacemaker   . CHF (congestive heart failure)   . H/O hiatal hernia   . Cough   . Myocardial infarct     '03-MI  . Arthritis     Past Surgical History  Procedure Laterality Date  . Cardiac defibrillator placement  01/17/03    6949 lead. medtronic. remote-no; with later revision  . Inguinal hernia repair      right  . Coronary artery bypass graft  02-06-11    11'03 6 vessel bypass  . Cataract extraction w/ intraocular lens  implant, bilateral  02-06-11    '09-june/ july-Dr. Katy Fitch  . Colon resection  02/09/2011    Procedure: LAPAROSCOPIC SIGMOID COLON RESECTION;  Surgeon: Pedro Earls, MD;  Location: [facility];  Service: General;  Laterality: N/A;  Laparoscopic Assisted Sigmoid Colectomy  . Colonoscopy  08/31/2011    Procedure: COLONOSCOPY;  Surgeon: Jerene Bears, MD;  Location: [facility];  Service: Gastroenterology;  Laterality: N/A;  . Colonoscopy N/A 09/05/2012   Procedure: COLONOSCOPY;  Surgeon: Jerene Bears, MD;  Location: [facility];  Service: Gastroenterology;  Laterality: N/A;  . Laparoscopic right hemi colectomy N/A 11/04/2012    Procedure: LAPAROSCOPIC RIGHT HEMI COLECTOMY;  Surgeon: Pedro Earls, MD;  Location: [facility];  Service: General;  Laterality: N/A;  . Colonoscopy N/A 04/18/2013    Procedure: COLONOSCOPY;  Surgeon: Jerene Bears, MD;  Location: [facility];  Service: Gastroenterology;  Laterality: N/A;     (Not in an outpatient encounter)  Allergies  Allergen Reactions  . Latex Rash  . Tape Rash    USE PAPER    Family History  Problem Relation Age of Onset  . Hypertension Father   . Hyperlipidemia Father   . Heart disease Father   . Prostate cancer Father   . Hypertension Sister   . Hyperlipidemia Sister   . Alzheimer's disease Mother   . Colon cancer Neg Hx   . Esophageal cancer Neg Hx   . Rectal cancer Neg Hx   . Stomach cancer Neg Hx     History  Substance Use Topics  . Smoking status: Never Smoker   . Smokeless tobacco: Never Used     Comment: tobacco use - no  . Alcohol Use: 4.2 oz/week    7 Cans of beer per week     Comment: daily beer    ROS: As per history of present illness, otherwise negative  BP  142/82 mmHg  Pulse 61  Temp(Src) 97.7 F (36.5 C) (Oral)  Resp 15  Ht 5\' 11"  (1.803 m)  Wt 170 lb (77.111 kg)  BMI 23.72 kg/m2  SpO2 100% Gen: awake, alert, NAD HEENT: anicteric, op clear CV: RRR, no mrg, icd in place Pulm: CTA b/l Abd: soft, NT/ND, +BS throughout Ext: no c/c/e Neuro: nonfocal   RELEVANT LABS AND IMAGING: CBC    Component Value Date/Time   WBC 5.6 11/24/2012 1045   RBC 4.18* 11/24/2012 1045   HGB 10.9* 04/18/2013 0725   HCT 34.1* 04/18/2013 0725   PLT 300 11/24/2012 1045   MCV 82.8 11/24/2012 1045   MCH 27.0 11/24/2012 1045   MCHC 32.7 11/24/2012 1045   RDW 15.4 11/24/2012 1045   LYMPHSABS 1.3 11/24/2012 1045   MONOABS 0.6 11/24/2012 1045   EOSABS 0.2 11/24/2012  1045   BASOSABS 0.1 11/24/2012 1045    CMP     Component Value Date/Time   NA 140 08/25/2013 0911   K 3.6 08/25/2013 0911   CL 105 08/25/2013 0911   CO2 30 08/25/2013 0911   GLUCOSE 103* 08/25/2013 0911   BUN 10 08/25/2013 0911   CREATININE 1.1 08/25/2013 0911   CALCIUM 9.5 08/25/2013 0911   PROT 6.5 09/06/2012 1245   ALBUMIN 3.5 09/06/2012 1245   AST 19 09/06/2012 1245   ALT 13 09/06/2012 1245   ALKPHOS 42 09/06/2012 1245   BILITOT 1.1 09/06/2012 1245   GFRNONAA 88* 11/14/2012 0440   GFRAA >90 11/14/2012 0440    ASSESSMENT/PLAN:  74 year old male with a past medical history of left-sided colon cancer status post sigmoidectomy, multiple colon polyps including large ascending colon adenoma requiring right hemicolectomy in August 2014 who presents for surveillance colonoscopy.   1. History of colon cancer/history of adenomatous colon polyp/elevated risk surveillance colonoscopy -- We are repeating colonoscopy 1 year from prior examination due to extensive history of colon cancer and multiple large adenomatous colon polyps.  The nature of the procedure, as well as the risks, benefits, and alternatives were carefully and thoroughly reviewed with the patient. Ample time for discussion and questions allowed. The patient understood, was satisfied, and agreed to proceed.  MAC sedation today.

## 2014-04-09 NOTE — Op Note (Signed)
[facility] [place], [id number]   COLONOSCOPY PROCEDURE REPORT  PATIENT: Mark Clayton, Mark Clayton  MR#: [id number] BIRTHDATE: [birth year] , 44  yrs. old GENDER: male ENDOSCOPIST: Jerene Bears, MD PROCEDURE DATE:  04/09/2014 PROCEDURE:   Colonoscopy with cold biopsy polypectomy First Screening Colonoscopy - Avg.  risk and is 50 yrs.  old or older - No.  Prior Negative Screening - Now for repeat screening. N/A  History of Adenoma - Now for follow-up colonoscopy & has been > or = to 3 yrs.  No.  It has been less than 3 yrs since last colonoscopy.  Medical reason.  Polyps Removed Today? Yes. ASA CLASS:   Class III INDICATIONS:high risk personal history of colonic polyps, high risk personal history of colon cancer, and last colonoscopy 12 months ago. MEDICATIONS: Monitored anesthesia care and Per Anesthesia  DESCRIPTION OF PROCEDURE:   After the risks benefits and alternatives of the procedure were thoroughly explained, informed consent was obtained.  The digital rectal exam revealed no rectal mass.   The Pentax Ped Colon [id number]  endoscope was introduced through the anus and advanced to the surgical anastomosis. No adverse events experienced.   The quality of the prep was [place]opik good  The instrument was then slowly withdrawn as the colon was fully examined.  COLON FINDINGS: There was evidence of a healthy appearing prior ileocolonic anastomosis in the ascending colon.   There was evidence of a normal appearing prior colo-colonic anastomosis in the sigmoid colon.   A sessile polyp measuring 3 mm in size was found in the descending colon.  A polypectomy was performed with cold forceps.  The resection was complete, the polyp tissue was completely retrieved and sent to histology.  Retroflexed views revealed internal hemorrhoids. The time to cecum=1 minutes 00 seconds.  Withdrawal time=16 minutes 00 seconds.  The scope was withdrawn and the procedure  completed. COMPLICATIONS: There were no immediate complications.  ENDOSCOPIC IMPRESSION: 1.   There was evidence of a healthy appearing prior ileocolonic anastomosis in the ascending colon 2.   There was evidence of a healthy appearing prior colo-colonic anastomosis in the sigmoid colon 3.   Sessile polyp was found in the descending colon; polypectomy was performed with cold forceps  RECOMMENDATIONS: 1.  Await pathology results 2.  Repeat Colonoscopy in 3 years. 3.  You will receive a letter within 1-2 weeks with the results of your biopsy as well as final recommendations.  Please call my office if you have not received a letter after 3 weeks.  eSigned:  Jerene Bears, MD 04/09/2014 11:56 AM   cc: the patient, Dineen Kid

## 2014-04-09 NOTE — Discharge Instructions (Signed)
YOU HAD AN ENDOSCOPIC PROCEDURE TODAY: Refer to the procedure report that was given to you for any specific questions about what was found during the examination.  If the procedure report does not answer your questions, please call your gastroenterologist to clarify.  YOU SHOULD EXPECT: Some feelings of bloating in the abdomen. Passage of more gas than usual.  Walking can help get rid of the air that was put into your GI tract during the procedure and reduce the bloating. If you had a lower endoscopy (such as a colonoscopy or flexible sigmoidoscopy) you may notice spotting of blood in your stool or on the toilet paper.   DIET: Your first meal following the procedure should be a light meal and then it is ok to progress to your normal diet.  A half-sandwich or bowl of soup is an example of a good first meal.  Heavy or fried foods are harder to digest and may make you feel nasueas or bloated.  Drink plenty of fluids but you should avoid alcoholic beverages for 24 hours.  ACTIVITY: Your care partner should take you home directly after the procedure.  You should plan to take it easy, moving slowly for the rest of the day.  You can resume normal activity the day after the procedure however you should NOT DRIVE or use heavy machinery for 24 hours (because of the sedation medicines used during the test).    SYMPTOMS TO REPORT IMMEDIATELY  A gastroenterologist can be reached at any hour.  Please call your doctor's office for any of the following symptoms:   Following lower endoscopy (colonoscopy, flexible sigmoidoscopy)  Excessive amounts of blood in the stool  Significant tenderness, worsening of abdominal pains  Swelling of the abdomen that is new, acute  Fever of 100 or higher  Following upper endoscopy (EGD, EUS, ERCP)  Vomiting of blood or coffee ground material  New, significant abdominal pain  New, significant chest pain or pain under the shoulder blades  Painful or persistently difficult  swallowing  New shortness of breath  Black, tarry-looking stools  FOLLOW UP: If any biopsies were taken you will be contacted by phone or by letter within the next 1-3 weeks.  Call your gastroenterologist if you have not heard about the biopsies in 3 weeks.  Please also call your gastroenterologist's office with any specific questions about appointments or follow up tests.  Colonoscopy A colonoscopy is an exam to look at your colon. This exam can help find lumps (tumors), growths (polyps), bleeding, and redness and puffiness (inflammation) in your colon.  BEFORE THE PROCEDURE  Ask your doctor about changing or stopping your regular medicines.  You may need to drink a large amount of a special liquid (oral bowel prep). You start drinking this the day before your procedure. It will cause you to have watery poop (stool). This cleans out your colon.  Do not eat or drink anything else once you have started the bowel prep, unless your doctor tells you it is safe to do so.  Make plans for someone to drive you home after the procedure. PROCEDURE  You will be given medicine to help you relax (sedative).  You will lie on your side with your knees bent.  A tube with a camera on the end is put in the opening of your butt (anus) and into your colon. Pictures are sent to a computer screen. Your doctor will look for anything that is not normal.  Your doctor may take a tissue  sample (biopsy) from your colon to be looked at more closely.  The exam is finished when your doctor has viewed all of the colon. AFTER THE PROCEDURE  Do not drive for 24 hours after the exam.  You may have a small amount of blood in your poop. This is normal.  You may pass gas and have belly (abdominal) cramps. This is normal.  Ask when your test results will be ready. Make sure you get your test results. Document Released: 04/18/2010 Document Revised: 03/21/2013 Document Reviewed: 11/21/2012 [facility] Patient  Information 2015 [place], [place]. This information is not intended to replace advice given to you by your health care provider. Make sure you discuss any questions you have with your health care provider.

## 2014-04-09 NOTE — Anesthesia Postprocedure Evaluation (Signed)
  Anesthesia Post-op Note  Patient: Mark Clayton  Procedure(s) Performed: Procedure(s) (LRB): COLONOSCOPY (N/A)  Patient Location: PACU  Anesthesia Type: MAC  Level of Consciousness: awake and alert   Airway and Oxygen Therapy: Patient Spontanous Breathing  Post-op Pain: mild  Post-op Assessment: Post-op Vital signs reviewed, Patient's Cardiovascular Status Stable, Respiratory Function Stable, Patent Airway and No signs of Nausea or vomiting  Last Vitals:  Filed Vitals:   04/09/14 1215  BP:   Pulse: 54  Temp:   Resp: 16    Post-op Vital Signs: stable   Complications: No apparent anesthesia complications

## 2014-04-09 NOTE — Anesthesia Preprocedure Evaluation (Addendum)
Anesthesia Evaluation  Patient identified by MRN, date of birth, ID band Patient awake    Reviewed: Allergy & Precautions, H&P , NPO status , Patient's Chart, lab work & pertinent test results, reviewed documented beta blocker date and time   History of Anesthesia Complications Negative for: history of anesthetic complications  Airway Mallampati: II  TM Distance: >3 FB Neck ROM: Full    Dental  (+) Missing, Chipped, Poor Dentition, Dental Advisory Given,    Pulmonary neg pulmonary ROS,  breath sounds clear to auscultation  Pulmonary exam normal       Cardiovascular hypertension, Pt. on medications + CAD, + Past MI, + CABG and +CHF + pacemaker + Cardiac Defibrillator  EF 25%. Ischemic cardiomyopathy.  MI 2003   Neuro/Psych negative neurological ROS  negative psych ROS   GI/Hepatic Neg liver ROS, hiatal hernia, GERD-  Medicated,  Endo/Other  negative endocrine ROS  Renal/GU negative Renal ROS  negative genitourinary   Musculoskeletal negative musculoskeletal ROS (+)   Abdominal Normal abdominal exam  (+)   Peds negative pediatric ROS (+)  Hematology negative hematology ROS (+)   Anesthesia Other Findings   Reproductive/Obstetrics negative OB ROS                           Anesthesia Physical Anesthesia Plan  ASA: IV  Anesthesia Plan: MAC   Post-op Pain Management:    Induction:   Airway Management Planned:   Additional Equipment:   Intra-op Plan:   Post-operative Plan:   Informed Consent:   Plan Discussed with: Surgeon  Anesthesia Plan Comments:         Anesthesia Quick Evaluation

## 2014-04-09 NOTE — Transfer of Care (Signed)
Immediate Anesthesia Transfer of Care Note  Patient: Mark Clayton  Procedure(s) Performed: Procedure(s): COLONOSCOPY (N/A)  Patient Location: Endo Recovery  Anesthesia Type:MAC  Level of Consciousness: Patient easily awoken, sedated, comfortable, cooperative, following commands, responds to stimulation.   Airway & Oxygen Therapy: Patient spontaneously breathing, ventilating well, oxygen via simple oxygen mask.  Post-op Assessment: Report given to PACU RN, vital signs reviewed and stable, moving all extremities.   Post vital signs: Reviewed and stable.  Complications: No apparent anesthesia complications

## 2014-04-10 ENCOUNTER — Encounter (HOSPITAL_COMMUNITY): Payer: Self-pay | Admitting: Internal Medicine

## 2014-04-11 ENCOUNTER — Encounter: Payer: Self-pay | Admitting: Internal Medicine

## 2014-04-13 NOTE — Addendum Note (Signed)
Addended by: Steva Ready on: 04/13/2014 07:14 AM   Modules accepted: Level of Service

## 2014-05-07 ENCOUNTER — Other Ambulatory Visit: Payer: Self-pay | Admitting: *Deleted

## 2014-05-07 MED ORDER — NITROGLYCERIN 0.4 MG SL SUBL: 0.4000 mg | SUBLINGUAL_TABLET | SUBLINGUAL | Status: DC | PRN

## 2014-05-14 ENCOUNTER — Ambulatory Visit (INDEPENDENT_AMBULATORY_CARE_PROVIDER_SITE_OTHER): Payer: Commercial Managed Care - HMO | Admitting: *Deleted

## 2014-05-14 DIAGNOSIS — Z95810 Presence of automatic (implantable) cardiac defibrillator: Secondary | ICD-10-CM

## 2014-05-14 LAB — MDC_IDC_ENUM_SESS_TYPE_INCLINIC
Battery Voltage: 3.06 V
Brady Statistic RV Percent Paced: 0.08 %
Date Time Interrogation Session: 20160215104825
HighPow Impedance: 41 Ohm
HighPow Impedance: 56 Ohm
Lead Channel Impedance Value: 532 Ohm
Lead Channel Pacing Threshold Amplitude: 1.25 V
Lead Channel Pacing Threshold Pulse Width: 0.8 ms
Lead Channel Sensing Intrinsic Amplitude: 31.625 mV
Lead Channel Setting Pacing Amplitude: 2.5 V
Lead Channel Setting Pacing Pulse Width: 0.8 ms
Lead Channel Setting Sensing Sensitivity: 0.45 mV
Zone Setting Detection Interval: 300 ms
Zone Setting Detection Interval: 370 ms
Zone Setting Detection Interval: 370 ms

## 2014-05-14 NOTE — Progress Notes (Signed)
Pt seen in clinic for follow up of ICD.  No complaints of chest pain, shortness of breath, dizziness, palpitations, or shocks.  Device functioning normally at this time.  For full details, see PaceArt report.  No programming changes made today.  Plan to follow up in May with Olin Pia.  Ranee Gosselin, RN, BSN 05/14/2014 10:13 AM

## 2014-05-16 ENCOUNTER — Other Ambulatory Visit: Payer: Self-pay | Admitting: *Deleted

## 2014-05-16 MED ORDER — NITROGLYCERIN 0.4 MG SL SUBL: 0.4000 mg | SUBLINGUAL_TABLET | SUBLINGUAL | Status: DC | PRN

## 2014-05-31 ENCOUNTER — Encounter: Payer: Self-pay | Admitting: Internal Medicine

## 2014-08-17 ENCOUNTER — Ambulatory Visit (INDEPENDENT_AMBULATORY_CARE_PROVIDER_SITE_OTHER): Payer: Medicare HMO | Admitting: Internal Medicine

## 2014-08-17 ENCOUNTER — Encounter: Payer: Self-pay | Admitting: Internal Medicine

## 2014-08-17 VITALS — BP 136/64 | HR 59 | Ht 72.0 in | Wt 168.8 lb

## 2014-08-17 DIAGNOSIS — Z95810 Presence of automatic (implantable) cardiac defibrillator: Secondary | ICD-10-CM | POA: Diagnosis not present

## 2014-08-17 DIAGNOSIS — Z4502 Encounter for adjustment and management of automatic implantable cardiac defibrillator: Secondary | ICD-10-CM

## 2014-08-17 DIAGNOSIS — I5022 Chronic systolic (congestive) heart failure: Secondary | ICD-10-CM | POA: Diagnosis not present

## 2014-08-17 DIAGNOSIS — I255 Ischemic cardiomyopathy: Secondary | ICD-10-CM | POA: Diagnosis not present

## 2014-08-17 LAB — CUP PACEART INCLINIC DEVICE CHECK
Battery Voltage: 3.03 V
Brady Statistic RV Percent Paced: 0.19 %
Date Time Interrogation Session: 20160520104828
HighPow Impedance: 44 Ohm
HighPow Impedance: 59 Ohm
Lead Channel Impedance Value: 532 Ohm
Lead Channel Pacing Threshold Amplitude: 1 V
Lead Channel Pacing Threshold Pulse Width: 0.8 ms
Lead Channel Sensing Intrinsic Amplitude: 31.625 mV
Lead Channel Setting Pacing Amplitude: 2.5 V
Lead Channel Setting Pacing Pulse Width: 0.8 ms
Lead Channel Setting Sensing Sensitivity: 0.45 mV
Zone Setting Detection Interval: 300 ms
Zone Setting Detection Interval: 370 ms
Zone Setting Detection Interval: 370 ms

## 2014-08-17 MED ORDER — LOSARTAN POTASSIUM 50 MG PO TABS: 50.0000 mg | ORAL_TABLET | Freq: Every day | ORAL | Status: DC

## 2014-08-17 MED ORDER — FUROSEMIDE 40 MG PO TABS: 40.0000 mg | ORAL_TABLET | Freq: Every day | ORAL | Status: DC

## 2014-08-17 MED ORDER — ISOSORBIDE MONONITRATE ER 30 MG PO TB24: 30.0000 mg | ORAL_TABLET | Freq: Every day | ORAL | Status: DC

## 2014-08-17 NOTE — Progress Notes (Signed)
Electrophysiology Office Note   Date:  08/17/2014   ID:  Jasmin, Trumbull July 05, 1940, MRN [id number]  PCP:  Leota Jacobsen, MD  Cardiologist:   Primary Electrophysiologist:  Virl Axe, MD    Chief Complaint  Patient presents with  . Follow-up    defib check     History of Present Illness:  Mark Clayton is a 74 y.o. male is       Seen in followup for an ICD implanted for primary prevention. He had a 6949-lead which was replaced with a right ventricular pace sense lead at the time of generator replacement. Stress testing January 2012 demonstrated mild periinfarct ischemia. EF 30%   Echo 10/14 EF 20% there was also RV dysfunction and significant LA enlargement (53/2.7)  He also has a history of an apical clot identified in 2009. Warfarin was discontinued about a year ago. He was on aspirin but htis was stopped 2/2 ocular injections for macular degeneration  At his last visit imdur and losartan were added and Lasix increased. His blood pressure is much better and his edema is resolved   Today, he denies  symptoms of palpitations, chest pain, orthopnea, PND, lower extremity edema, claudication  presyncope, syncope, bleeding, or neurologic sequela. The patient is tolerating medications without difficulties and is otherwise without complaint today.   He does however note shortness of breath with more extreme exertion like walking up a hill with a gallon of water. He denies associated chest discomfort. He also notes shortness of breath when he is bent over and tries to stand.    Past Medical History  Diagnosis Date  . Ischemic cardiomyopathy     proir bypass. EF 25%. (Master study EF >20%)  . Carotid bruit   . Hyperlipidemia   . CAD (coronary artery disease)     severe 3 vessel CAD w/unstable angina and acute MI  . HTN (hypertension)     systolic  . GERD (gastroesophageal reflux disease) 02-06-11    reflux is controlled-Omeprazole  . Colon cancer 02-06-11   Sigmoid  . Anemia 02-06-11    takes oral iron  . ICD (implantable cardiac defibrillator) in place   . Pacemaker   . CHF (congestive heart failure)   . H/O hiatal hernia   . Cough   . Myocardial infarct     '03-MI  . Arthritis    Past Surgical History  Procedure Laterality Date  . Cardiac defibrillator placement  01/17/03    6949 lead. medtronic. remote-no; with later revision  . Inguinal hernia repair      right  . Coronary artery bypass graft  02-06-11    11'03 6 vessel bypass  . Cataract extraction w/ intraocular lens  implant, bilateral  02-06-11    '09-june/ july-Dr. Katy Fitch  . Colon resection  02/09/2011    Procedure: LAPAROSCOPIC SIGMOID COLON RESECTION;  Surgeon: Pedro Earls, MD;  Location: [facility];  Service: General;  Laterality: N/A;  Laparoscopic Assisted Sigmoid Colectomy  . Colonoscopy  08/31/2011    Procedure: COLONOSCOPY;  Surgeon: Jerene Bears, MD;  Location: [facility];  Service: Gastroenterology;  Laterality: N/A;  . Colonoscopy N/A 09/05/2012    Procedure: COLONOSCOPY;  Surgeon: Jerene Bears, MD;  Location: [facility];  Service: Gastroenterology;  Laterality: N/A;  . Laparoscopic right hemi colectomy N/A 11/04/2012    Procedure: LAPAROSCOPIC RIGHT HEMI COLECTOMY;  Surgeon: Pedro Earls, MD;  Location: [facility];  Service: General;  Laterality: N/A;  . Colonoscopy  N/A 04/18/2013    Procedure: COLONOSCOPY;  Surgeon: Jerene Bears, MD;  [facility]: [facility];  Service: Gastroenterology;  Laterality: N/A;  . Colonoscopy N/A 04/09/2014    Procedure: COLONOSCOPY;  Surgeon: Jerene Bears, MD;  Location: [facility];  Service: Gastroenterology;  Laterality: N/A;     Current Outpatient Prescriptions  Medication Sig Dispense Refill  . acetaminophen (TYLENOL) 500 MG tablet Take 500-1,000 mg by mouth daily as needed for moderate pain or headache.    Marland Kitchen aspirin 81 MG tablet Take 81 mg by mouth daily. At night.    . carvedilol (COREG) 25 MG tablet Take 25 mg by mouth at  bedtime.     . furosemide (LASIX) 40 MG tablet Take 1 tablet (40 mg total) by mouth daily. 90 tablet 1  . isosorbide mononitrate (IMDUR) 30 MG 24 hr tablet Take 1 tablet (30 mg total) by mouth daily. 90 tablet 1  . losartan (COZAAR) 50 MG tablet Take 1 tablet (50 mg total) by mouth daily. 90 tablet 1  . meclizine (ANTIVERT) 25 MG tablet Take 25 mg by mouth daily as needed for dizziness.     . meloxicam (MOBIC) 7.5 MG tablet Take 7.5 mg by mouth every morning.    . nitroGLYCERIN (NITROSTAT) 0.4 MG SL tablet Place 1 tablet (0.4 mg total) under the tongue every 5 (five) minutes as needed for chest pain. Chest pain 25 tablet 1  . omeprazole (PRILOSEC) 20 MG capsule Take 1 capsule (20 mg total) by mouth daily. 90 capsule 0  . pravastatin (PRAVACHOL) 20 MG tablet Take 20 mg by mouth at bedtime.     Marland Kitchen PRESCRIPTION MEDICATION 1 each every 5 (five) weeks. Eye Injection every 5 weeks.     No current facility-administered medications for this visit.    Allergies:   Latex and Tape   Social History:  The patient  reports that he has never smoked. He has never used smokeless tobacco. He reports that he drinks about 4.2 oz of alcohol per week. He reports that he does not use illicit drugs.   Family History:  The patient's family history includes Alzheimer's disease in his mother; Heart disease in his father; Hyperlipidemia in his father and sister; Hypertension in his father and sister; Prostate cancer in his father. There is no history of Colon cancer, Esophageal cancer, Rectal cancer, or Stomach cancer.    ROS:  Please see the history of present illness and past medical history  Otherwise, all other systems were reviewed and were negative .     PHYSICAL EXAM: VS:  BP 136/64 mmHg  Pulse 59  Ht 6' (1.829 m)  Wt 168 lb 12.8 oz (76.567 kg)  BMI 22.89 kg/m2 , BMI Body mass index is 22.89 kg/(m^2). GEN: Well nourished, well developed, in no acute distress HEENT: normal Neck:  JVD flat , carotid bruits,  or masses Cardiac: REGULAR RATE and RHYTHM ;  No murmurs, rubs, No S4  Back without kyphosis; No CVAT Respiratory:  clear to auscultation bilaterally, normal work of breathing GI: soft, nontender, nondistended, + BS MS: no deformity or atrophy Extremities no clubbing cyanosis    edema Skin: warm and dry,   device pocket is well healed without teathering Neuro:  Strength and sensation are intact Psych: euthymic mood, full affect  EKG:  EKG is ordered today. The ekg ordered today shows sinus rhythm at 59 Intervals 19/15/46 Axis XLVII LVH with repolarization abnormalities  Device interrogation is reviewed today in detail.  See  PaceArt for details.   Recent Labs: 08/25/2013: BUN 10; Creatinine 1.1; Potassium 3.6; Sodium 140    Lipid Panel     Component Value Date/Time   CHOL 158 08/22/2010 1136   TRIG 66 08/22/2010 1136   HDL 59 08/22/2010 1136   CHOLHDL 2.7 08/22/2010 1136   VLDL 13 08/22/2010 1136   LDLCALC 86 08/22/2010 1136     Wt Readings from Last 3 Encounters:  08/17/14 168 lb 12.8 oz (76.567 kg)  04/09/14 170 lb (77.111 kg)  03/16/14 173 lb 12.8 oz (78.835 kg)      Other studies Reviewed: Additional studies/ records that were reviewed today include:  Myoview scan 2012 demonstrated large scar without ischemia EF was 29%      ASSESSMENT AND PLAN:  Ischemic cardio myopathy  Implantable defibrillator-Medtronic-single chamber  Hypertension   Bradycardia  Congestive heart failure-chronic-systolic  Orthostatic intolerance  Discussed the physiology of orthostatic intolerance. He will need to be careful as he stands as his blood pressure right now is well-controlled on his current medical regime.  Review of his Histograms raises the possibility of chronotropic incompetence which may be related to his carvedilol. It is also been 10 years plus since his bypass surgery; we will repeat a Myoview undertaken with stress testing to look both her chronotropic  competence as well as coronary perfusion. Catheterization or carvedilol down titration may be treatment/diagnostic options    Current medicines are reviewed at length with the patient today.   The patient does not have concerns regarding his medicines.  The following changes were made today:  none  Labs/ tests ordered today include: As above  No orders of the defined types were placed in this encounter.     Disposition:   FU with me  ** 1 year(s)  Signed, Virl Axe, MD  08/17/2014 10:08 AM     [address] [address] [place] [address] [phone] (office) [phone] (fax)

## 2014-08-17 NOTE — Patient Instructions (Signed)
Medication Instructions:  Your physician recommends that you continue on your current medications as directed. Please refer to the Current Medication list given to you today.  Labwork: None ordered  Testing/Procedures: Your physician has requested that you have en exercise stress myoview. For further information please visit [email or web address]. Please follow instruction sheet, as given.  Follow-Up: Remote monitoring is used to monitor your Pacemaker of ICD from home. This monitoring reduces the number of office visits required to check your device to one time per year. It allows [place] to keep an eye on the functioning of your device to ensure it is working properly. You are scheduled for a device check from home on 11/19/14. You may send your transmission at any time that day. If you have a wireless device, the transmission will be sent automatically. After your physician reviews your transmission, you will receive a postcard with your next transmission date.  Your physician wants you to follow-up in: 1 year with Dr. Caryl Comes.  You will receive a reminder letter in the mail two months in advance. If you don't receive a letter, please call our office to schedule the follow-up appointment.   Thank you for choosing [facility]!!

## 2014-08-21 ENCOUNTER — Encounter: Payer: Self-pay | Admitting: Internal Medicine

## 2014-08-29 ENCOUNTER — Telehealth (HOSPITAL_COMMUNITY): Payer: Self-pay | Admitting: *Deleted

## 2014-08-29 NOTE — Telephone Encounter (Signed)
Patient given detailed instructions per Myocardial Perfusion Study Information Sheet for test on 08/29/14 at 7:15 Patient verbalized understanding. Hubbard Robinson, RN

## 2014-08-31 ENCOUNTER — Ambulatory Visit (HOSPITAL_COMMUNITY): Payer: Medicare HMO | Attending: Internal Medicine

## 2014-08-31 DIAGNOSIS — I5022 Chronic systolic (congestive) heart failure: Secondary | ICD-10-CM | POA: Insufficient documentation

## 2014-08-31 DIAGNOSIS — I255 Ischemic cardiomyopathy: Secondary | ICD-10-CM | POA: Diagnosis not present

## 2014-08-31 LAB — MYOCARDIAL PERFUSION IMAGING
LV dias vol: 300 mL
LV sys vol: 271 mL
Nuc Stress EF: 20 %
Peak HR: 93 {beats}/min
RATE: 0.34
Rest HR: 64 {beats}/min
SDS: 2
SRS: 20
SSS: 22
TID: 1.06

## 2014-08-31 MED ORDER — TECHNETIUM TC 99M SESTAMIBI GENERIC - CARDIOLITE
33.0000 | Freq: Once | INTRAVENOUS | Status: AC | PRN
  Administered 2014-08-31: 33 via INTRAVENOUS

## 2014-08-31 MED ORDER — TECHNETIUM TC 99M SESTAMIBI GENERIC - CARDIOLITE
11.0000 | Freq: Once | INTRAVENOUS | Status: AC | PRN
  Administered 2014-08-31: 11 via INTRAVENOUS

## 2014-08-31 MED ORDER — REGADENOSON 0.4 MG/5ML IV SOLN
0.4000 mg | Freq: Once | INTRAVENOUS | Status: AC
  Administered 2014-08-31: 0.4 mg via INTRAVENOUS

## 2014-09-06 ENCOUNTER — Telehealth: Payer: Self-pay | Admitting: Internal Medicine

## 2014-09-06 NOTE — Telephone Encounter (Signed)
New message  Pt called req a call back states that he is getting worse not getting better. No energy. States that he had a stress test. Requests a call back to discuss the results as well/sr

## 2014-09-06 NOTE — Telephone Encounter (Signed)
Reviewed results with patient. Patient tells me that he is getting no better.  Complains of being tired all the time, can't do anything without getting out of breath.  He spoke of carrying his dog some water, not even 50 ft, and barely made it back to the house. Advised that I will have scheduler to call him and schedule him to come see Dr. Caryl Comes next week to discuss/evaluate. Patient agreeable and aware she will call him.

## 2014-09-14 ENCOUNTER — Encounter: Payer: Self-pay | Admitting: Internal Medicine

## 2014-09-14 ENCOUNTER — Ambulatory Visit (INDEPENDENT_AMBULATORY_CARE_PROVIDER_SITE_OTHER): Payer: Medicare HMO | Admitting: Internal Medicine

## 2014-09-14 VITALS — BP 128/70 | HR 66 | Ht 72.0 in | Wt 169.0 lb

## 2014-09-14 DIAGNOSIS — I255 Ischemic cardiomyopathy: Secondary | ICD-10-CM

## 2014-09-14 DIAGNOSIS — R079 Chest pain, unspecified: Secondary | ICD-10-CM | POA: Diagnosis not present

## 2014-09-14 LAB — CBC WITH DIFFERENTIAL/PLATELET
Basophils Absolute: 0 10*3/uL (ref 0.0–0.1)
Basophils Relative: 0.5 % (ref 0.0–3.0)
Eosinophils Absolute: 0.2 10*3/uL (ref 0.0–0.7)
Eosinophils Relative: 3.2 % (ref 0.0–5.0)
HCT: 34.5 % — ABNORMAL LOW (ref 39.0–52.0)
Hemoglobin: 10.9 g/dL — ABNORMAL LOW (ref 13.0–17.0)
Lymphocytes Relative: 23.3 % (ref 12.0–46.0)
Lymphs Abs: 1.5 10*3/uL (ref 0.7–4.0)
MCHC: 31.6 g/dL (ref 30.0–36.0)
MCV: 76.7 fl — ABNORMAL LOW (ref 78.0–100.0)
Monocytes Absolute: 0.8 10*3/uL (ref 0.1–1.0)
Monocytes Relative: 11.8 % (ref 3.0–12.0)
Neutro Abs: 4 10*3/uL (ref 1.4–7.7)
Neutrophils Relative %: 61.2 % (ref 43.0–77.0)
Platelets: 197 10*3/uL (ref 150.0–400.0)
RBC: 4.5 Mil/uL (ref 4.22–5.81)
RDW: 17.4 % — ABNORMAL HIGH (ref 11.5–15.5)
WBC: 6.6 10*3/uL (ref 4.0–10.5)

## 2014-09-14 LAB — CUP PACEART INCLINIC DEVICE CHECK
Battery Voltage: 3.02 V
Brady Statistic RV Percent Paced: 0.07 %
Date Time Interrogation Session: 20160617142715
HighPow Impedance: 42 Ohm
HighPow Impedance: 54 Ohm
Lead Channel Impedance Value: 494 Ohm
Lead Channel Sensing Intrinsic Amplitude: 31.625 mV
Lead Channel Sensing Intrinsic Amplitude: 31.625 mV
Lead Channel Setting Pacing Amplitude: 2.5 V
Lead Channel Setting Pacing Pulse Width: 0.8 ms
Lead Channel Setting Sensing Sensitivity: 0.45 mV
Zone Setting Detection Interval: 300 ms
Zone Setting Detection Interval: 370 ms
Zone Setting Detection Interval: 370 ms

## 2014-09-14 LAB — BASIC METABOLIC PANEL
BUN: 13 mg/dL (ref 6–23)
CO2: 29 mEq/L (ref 19–32)
Calcium: 9.3 mg/dL (ref 8.4–10.5)
Chloride: 104 mEq/L (ref 96–112)
Creatinine, Ser: 1.01 mg/dL (ref 0.40–1.50)
GFR: 76.8 mL/min (ref >60.00)
Glucose, Bld: 86 mg/dL (ref 70–99)
Potassium: 3.8 mEq/L (ref 3.5–5.1)
Sodium: 137 mEq/L (ref 135–145)

## 2014-09-14 NOTE — Patient Instructions (Addendum)
Medication Instructions:  Your physician recommends that you continue on your current medications as directed. Please refer to the Current Medication list given to you today.   Labwork: Your physician recommends that you return for lab work TODAY (BMET, CBC)   Testing/Procedures: Your physician has requested that you have a cardiac catheterization. Cardiac catheterization is used to diagnose and/or treat various heart conditions. Doctors may recommend this procedure for a number of different reasons. The most common reason is to evaluate chest pain. Chest pain can be a symptom of coronary artery disease (CAD), and cardiac catheterization can show whether plaque is narrowing or blocking your heart's arteries. This procedure is also used to evaluate the valves, as well as measure the blood flow and oxygen levels in different parts of your heart. For further information please visit [email or web address]. Please follow instruction sheet, as given.    Follow-Up: Your physician recommends that you schedule a follow-up appointment in: 3 month with Dr. Caryl Comes.    Any Other Special Instructions Will Be Listed Below (If Applicable).

## 2014-09-14 NOTE — Progress Notes (Signed)
Electrophysiology Office Note   Date:  09/14/2014   ID:  Mark Clayton, DOB [birth year], MRN [id number]  PCP:  Leota Jacobsen, MD  Cardiologist:   Primary Electrophysiologist:  Mark Axe, MD    No chief complaint on file.    History of Present Illness:  Mark Clayton is a 74 y.o. male is       Seen in followup for an ICD implanted for primary prevention. He had a 6949-lead which was replaced with a right ventricular pace sense lead at the time of generator replacement. Stress testing January 2012 demonstrated mild periinfarct ischemia. EF 30%   Catheterization 2003 demonstrated severe three-vessel disease prompting bypass surgery; he has had no subsequent catheterization He underwent Myoview scanning 6/16 demonstrating severe LV dysfunction and no significant ischemia. Reports the images were reviewed  this was undertaken because of symptoms of progressive exercise intolerance  Echo 10/14 EF 20% there was also RV dysfunction and significant LA enlargement (53/2.7)  He also has a history of an apical clot identified in 2009.  Warfarin was discontinued about a year ago. He was on aspirin but htis was stopped 2/2 ocular injections for macular degeneration  At his last visit imdur and losartan were added and Lasix increased. His blood pressure is much better and his edema is resolved  He continues to complain of exercise intolerance manifested mostly by tightness in his chest with radiation into his neck and accompanied by weakness. There is no significant shortness of breath he has no nocturnal dyspnea or orthopnea. He has had no peripheral edema   Past Medical History  Diagnosis Date  . Ischemic cardiomyopathy     proir bypass. EF 25%. (Master study EF >20%)  . Carotid bruit   . Hyperlipidemia   . CAD (coronary artery disease)     severe 3 vessel CAD w/unstable angina and acute MI  . HTN (hypertension)     systolic  . GERD (gastroesophageal reflux disease) 02-06-11      reflux is controlled-Omeprazole  . Colon cancer 02-06-11     Sigmoid  . Anemia 02-06-11    takes oral iron  . ICD (implantable cardiac defibrillator) in place   . Pacemaker   . CHF (congestive heart failure)   . H/O hiatal hernia   . Cough   . Myocardial infarct     '03-MI  . Arthritis    Past Surgical History  Procedure Laterality Date  . Cardiac defibrillator placement  01/17/03    6949 lead. medtronic. remote-no; with later revision  . Inguinal hernia repair      right  . Coronary artery bypass graft  02-06-11    11'03 6 vessel bypass  . Cataract extraction w/ intraocular lens  implant, bilateral  02-06-11    '09-june/ july-Dr. Katy Fitch  . Colon resection  02/09/2011    Procedure: LAPAROSCOPIC SIGMOID COLON RESECTION;  Surgeon: Pedro Earls, MD;  Location: [facility];  Service: General;  Laterality: N/A;  Laparoscopic Assisted Sigmoid Colectomy  . Colonoscopy  08/31/2011    Procedure: COLONOSCOPY;  Surgeon: Jerene Bears, MD;  Location: [facility];  Service: Gastroenterology;  Laterality: N/A;  . Colonoscopy N/A 09/05/2012    Procedure: COLONOSCOPY;  Surgeon: Jerene Bears, MD;  Location: [facility];  Service: Gastroenterology;  Laterality: N/A;  . Laparoscopic right hemi colectomy N/A 11/04/2012    Procedure: LAPAROSCOPIC RIGHT HEMI COLECTOMY;  Surgeon: Pedro Earls, MD;  Location: [facility];  Service: General;  Laterality: N/A;  .  Colonoscopy N/A 04/18/2013    Procedure: COLONOSCOPY;  Surgeon: Jerene Bears, MD;  Location: [facility];  Service: Gastroenterology;  Laterality: N/A;  . Colonoscopy N/A 04/09/2014    Procedure: COLONOSCOPY;  Surgeon: Jerene Bears, MD;  Location: [facility];  Service: Gastroenterology;  Laterality: N/A;     Current Outpatient Prescriptions  Medication Sig Dispense Refill  . acetaminophen (TYLENOL) 500 MG tablet Take 500-1,000 mg by mouth daily as needed for moderate pain or headache.    Marland Kitchen aspirin 81 MG tablet Take 81 mg by mouth daily. At night.     . carvedilol (COREG) 25 MG tablet Take 25 mg by mouth at bedtime.     . furosemide (LASIX) 40 MG tablet Take 1 tablet (40 mg total) by mouth daily. 90 tablet 3  . isosorbide mononitrate (IMDUR) 30 MG 24 hr tablet Take 1 tablet (30 mg total) by mouth daily. 90 tablet 3  . losartan (COZAAR) 50 MG tablet Take 1 tablet (50 mg total) by mouth daily. 90 tablet 3  . meclizine (ANTIVERT) 25 MG tablet Take 25 mg by mouth daily as needed for dizziness.     . nitroGLYCERIN (NITROSTAT) 0.4 MG SL tablet Place 1 tablet (0.4 mg total) under the tongue every 5 (five) minutes as needed for chest pain. Chest pain 25 tablet 1  . omeprazole (PRILOSEC) 20 MG capsule Take 1 capsule (20 mg total) by mouth daily. 90 capsule 0  . pravastatin (PRAVACHOL) 20 MG tablet Take 20 mg by mouth at bedtime.     Marland Kitchen PRESCRIPTION MEDICATION 1 each every 5 (five) weeks. Eye Injection every 5 weeks.     No current facility-administered medications for this visit.    Allergies:   Latex and Tape   Social History:  The patient  reports that he has never smoked. He has never used smokeless tobacco. He reports that he drinks about 4.2 oz of alcohol per week. He reports that he does not use illicit drugs.   Family History:  The patient's family history includes Alzheimer's disease in his mother; Heart disease in his father; Hyperlipidemia in his father and sister; Hypertension in his father and sister; Prostate cancer in his father. There is no history of Colon cancer, Esophageal cancer, Rectal cancer, or Stomach cancer.    ROS:  Please see the history of present illness and past medical history  Otherwise, all other systems were reviewed and were negative .     PHYSICAL EXAM: VS:  BP 128/70 mmHg  Pulse 66  Ht 6' (1.829 m)  Wt 169 lb (76.658 kg)  BMI 22.92 kg/m2  SpO2 97% , BMI Body mass index is 22.92 kg/(m^2). GEN: Well nourished, well developed, in no acute distress HEENT: normal Neck:  JVD flat , carotid bruits, or  masses Cardiac: REGULAR RATE and RHYTHM ;  No murmurs, rubs, No S4  Back without kyphosis; No CVAT Respiratory:  clear to auscultation bilaterally, normal work of breathing GI: soft, nontender, nondistended, + BS MS: no deformity or atrophy Extremities no clubbing cyanosis  edema Skin: warm and dry,   device pocket is well healed without teathering Neuro:  Strength and sensation are intact Psych: euthymic mood, full affect  EKG:  EKG is ordered today. The ekg ordered today shows sinus rhythm at 66  Interval 17/15/44    [place] nnonspecific IVCD    Device interrogation is reviewed today in detail.  See PaceArt for details.   Recent Labs: No results found for  requested labs within last 365 days.    Lipid Panel     Component Value Date/Time   CHOL 158 08/22/2010 1136   TRIG 66 08/22/2010 1136   HDL 59 08/22/2010 1136   CHOLHDL 2.7 08/22/2010 1136   VLDL 13 08/22/2010 1136   LDLCALC 86 08/22/2010 1136     Wt Readings from Last 3 Encounters:  09/14/14 169 lb (76.658 kg)  08/31/14 170 lb (77.111 kg)  08/17/14 168 lb 12.8 oz (76.567 kg)      Other studies Reviewed: Additional studies/ records that were reviewed today include:  Myoview scan 2012 demonstrated large scar without ischemia EF was 29%      ASSESSMENT AND PLAN:  Ischemic cardio myopathy  Implantable defibrillator-Medtronic-single chamber  Hypertension   Bradycardia  Congestive heart failure-chronic-systolic  Angina   he is currently euvolemic. Blood pressure is well-controlled although with his episode the other day of significant chest discomfort it was quite high.  It is now 13 years bypass and the likelihood that there is graft disease not withstanding his Myoview is still quite high. We have talked about proceeding with catheterization. I'm not sure that right heart catheterization is necessary unless it turns out his LVEDP is quite high.      Current medicines are reviewed at  length with the patient today.   The patient does not have concerns regarding his medicines.  The following changes were made today:  none  Labs/ tests ordered today include: As above  No orders of the defined types were placed in this encounter.     Disposition:   FU with me  In 3 months  Signed, Mark Axe, MD  09/14/2014 1:34 PM     [place] [place] [address] [phone] (office) [phone] (fax)

## 2014-09-20 ENCOUNTER — Encounter (HOSPITAL_COMMUNITY)
Admission: RE | Disposition: A | Discharge status: Home / Self Care | Payer: Medicare HMO | Source: Ambulatory Visit | Attending: Cardiovascular Disease

## 2014-09-20 ENCOUNTER — Other Ambulatory Visit: Payer: Self-pay

## 2014-09-20 ENCOUNTER — Ambulatory Visit (HOSPITAL_COMMUNITY)
Admission: RE | Admit: 2014-09-20 | Discharge: 2014-09-21 | Disposition: A | Discharge status: Home / Self Care | Payer: Medicare HMO | Source: Ambulatory Visit | Attending: Cardiovascular Disease | Admitting: Cardiovascular Disease

## 2014-09-20 ENCOUNTER — Encounter (HOSPITAL_COMMUNITY): Payer: Self-pay | Admitting: Interventional Cardiology

## 2014-09-20 DIAGNOSIS — I209 Angina pectoris, unspecified: Secondary | ICD-10-CM

## 2014-09-20 DIAGNOSIS — I5022 Chronic systolic (congestive) heart failure: Secondary | ICD-10-CM | POA: Insufficient documentation

## 2014-09-20 DIAGNOSIS — Z951 Presence of aortocoronary bypass graft: Secondary | ICD-10-CM | POA: Diagnosis not present

## 2014-09-20 DIAGNOSIS — I25119 Atherosclerotic heart disease of native coronary artery with unspecified angina pectoris: Secondary | ICD-10-CM

## 2014-09-20 DIAGNOSIS — I255 Ischemic cardiomyopathy: Secondary | ICD-10-CM | POA: Diagnosis not present

## 2014-09-20 DIAGNOSIS — I10 Essential (primary) hypertension: Secondary | ICD-10-CM | POA: Insufficient documentation

## 2014-09-20 DIAGNOSIS — R079 Chest pain, unspecified: Secondary | ICD-10-CM

## 2014-09-20 DIAGNOSIS — Z85038 Personal history of other malignant neoplasm of large intestine: Secondary | ICD-10-CM | POA: Diagnosis not present

## 2014-09-20 DIAGNOSIS — I252 Old myocardial infarction: Secondary | ICD-10-CM | POA: Insufficient documentation

## 2014-09-20 DIAGNOSIS — I200 Unstable angina: Secondary | ICD-10-CM | POA: Diagnosis present

## 2014-09-20 DIAGNOSIS — Z7902 Long term (current) use of antithrombotics/antiplatelets: Secondary | ICD-10-CM | POA: Diagnosis not present

## 2014-09-20 DIAGNOSIS — H3530 Unspecified macular degeneration: Secondary | ICD-10-CM | POA: Diagnosis not present

## 2014-09-20 DIAGNOSIS — E785 Hyperlipidemia, unspecified: Secondary | ICD-10-CM | POA: Diagnosis not present

## 2014-09-20 DIAGNOSIS — Z7982 Long term (current) use of aspirin: Secondary | ICD-10-CM | POA: Insufficient documentation

## 2014-09-20 DIAGNOSIS — I2584 Coronary atherosclerosis due to calcified coronary lesion: Secondary | ICD-10-CM | POA: Insufficient documentation

## 2014-09-20 DIAGNOSIS — I2582 Chronic total occlusion of coronary artery: Secondary | ICD-10-CM | POA: Diagnosis not present

## 2014-09-20 DIAGNOSIS — Z955 Presence of coronary angioplasty implant and graft: Secondary | ICD-10-CM

## 2014-09-20 HISTORY — PX: CARDIAC CATHETERIZATION: SHX172

## 2014-09-20 HISTORY — DX: Presence of automatic (implantable) cardiac defibrillator: Z95.810

## 2014-09-20 HISTORY — DX: Malignant neoplasm of sigmoid colon: C18.7

## 2014-09-20 LAB — PLATELET COUNT: Platelets: 166 10*3/uL (ref 150–400)

## 2014-09-20 LAB — PROTIME-INR
INR: 1.05 (ref 0.00–1.49)
Prothrombin Time: 13.9 seconds (ref 11.6–15.2)

## 2014-09-20 LAB — POCT ACTIVATED CLOTTING TIME
Activated Clotting Time: 153 seconds
Activated Clotting Time: 171 seconds
Activated Clotting Time: 313 seconds

## 2014-09-20 SURGERY — LEFT HEART CATH AND CORONARY ANGIOGRAPHY
Anesthesia: LOCAL

## 2014-09-20 MED ORDER — FAMOTIDINE IN NACL 20-0.9 MG/50ML-% IV SOLN
INTRAVENOUS | Status: AC
  Filled 2014-09-20: qty 50

## 2014-09-20 MED ORDER — NITROGLYCERIN 1 MG/10 ML FOR IR/CATH LAB
INTRA_ARTERIAL | Status: AC
  Filled 2014-09-20: qty 10

## 2014-09-20 MED ORDER — ASPIRIN 81 MG PO CHEW: 81.0000 mg | CHEWABLE_TABLET | ORAL | Status: DC

## 2014-09-20 MED ORDER — LIDOCAINE HCL (PF) 1 % IJ SOLN
INTRAMUSCULAR | Status: DC | PRN
  Administered 2014-09-20: 5 mL via INTRADERMAL

## 2014-09-20 MED ORDER — TIROFIBAN HCL IV 5 MG/100ML
INTRAVENOUS | Status: DC | PRN
  Administered 2014-09-20: 0.15 ug/kg/min via INTRAVENOUS

## 2014-09-20 MED ORDER — SODIUM CHLORIDE 0.9 % IV SOLN
INTRAVENOUS | Status: DC | PRN
  Administered 2014-09-20: 10 mL/h via INTRAVENOUS

## 2014-09-20 MED ORDER — ACETAMINOPHEN 500 MG PO TABS: 500.0000 mg | ORAL_TABLET | Freq: Every day | ORAL | Status: DC | PRN

## 2014-09-20 MED ORDER — ASPIRIN EC 81 MG PO TBEC
81.0000 mg | DELAYED_RELEASE_TABLET | Freq: Every day | ORAL | Status: DC
  Administered 2014-09-21: 10:00:00 81 mg via ORAL
  Filled 2014-09-20: qty 1

## 2014-09-20 MED ORDER — SODIUM CHLORIDE 0.9 % IV SOLN
INTRAVENOUS | Status: AC
Start: 2014-09-20 — End: 2014-09-20

## 2014-09-20 MED ORDER — LIDOCAINE HCL (PF) 1 % IJ SOLN
INTRAMUSCULAR | Status: AC
  Filled 2014-09-20: qty 30

## 2014-09-20 MED ORDER — CARVEDILOL 12.5 MG PO TABS
25.0000 mg | ORAL_TABLET | Freq: Every day | ORAL | Status: DC
  Administered 2014-09-20: 22:00:00 25 mg via ORAL
  Filled 2014-09-20: qty 2

## 2014-09-20 MED ORDER — MECLIZINE HCL 25 MG PO TABS
25.0000 mg | ORAL_TABLET | Freq: Every day | ORAL | Status: DC | PRN
  Filled 2014-09-20: qty 1

## 2014-09-20 MED ORDER — VERAPAMIL HCL 2.5 MG/ML IV SOLN
INTRAVENOUS | Status: DC | PRN
  Administered 2014-09-20: 10:00:00 via INTRA_ARTERIAL

## 2014-09-20 MED ORDER — FENTANYL CITRATE (PF) 100 MCG/2ML IJ SOLN
INTRAMUSCULAR | Status: AC
  Filled 2014-09-20: qty 2

## 2014-09-20 MED ORDER — ACETAMINOPHEN 325 MG PO TABS: 650.0000 mg | ORAL_TABLET | ORAL | Status: DC | PRN

## 2014-09-20 MED ORDER — LOSARTAN POTASSIUM 50 MG PO TABS
50.0000 mg | ORAL_TABLET | Freq: Every day | ORAL | Status: DC
  Administered 2014-09-21: 50 mg via ORAL
  Filled 2014-09-20: qty 1

## 2014-09-20 MED ORDER — SODIUM CHLORIDE 0.9 % IV SOLN: 250.0000 mL | INTRAVENOUS | Status: DC | PRN

## 2014-09-20 MED ORDER — ASPIRIN 81 MG PO CHEW: 81.0000 mg | CHEWABLE_TABLET | Freq: Every day | ORAL | Status: DC

## 2014-09-20 MED ORDER — ONDANSETRON HCL 4 MG/2ML IJ SOLN: 4.0000 mg | Freq: Four times a day (QID) | INTRAMUSCULAR | Status: DC | PRN

## 2014-09-20 MED ORDER — PRAVASTATIN SODIUM 20 MG PO TABS
20.0000 mg | ORAL_TABLET | Freq: Every day | ORAL | Status: DC
  Administered 2014-09-20: 20 mg via ORAL
  Filled 2014-09-20 (×2): qty 1

## 2014-09-20 MED ORDER — MIDAZOLAM HCL 2 MG/2ML IJ SOLN
INTRAMUSCULAR | Status: AC
  Filled 2014-09-20: qty 2

## 2014-09-20 MED ORDER — FAMOTIDINE IN NACL 20-0.9 MG/50ML-% IV SOLN
INTRAVENOUS | Status: DC | PRN
  Administered 2014-09-20: 20 mg via INTRAVENOUS

## 2014-09-20 MED ORDER — ISOSORBIDE MONONITRATE ER 30 MG PO TB24
30.0000 mg | ORAL_TABLET | Freq: Every day | ORAL | Status: DC
  Administered 2014-09-21: 10:00:00 30 mg via ORAL
  Filled 2014-09-20: qty 1

## 2014-09-20 MED ORDER — TIROFIBAN HCL IV 5 MG/100ML
INTRAVENOUS | Status: AC
  Filled 2014-09-20: qty 100

## 2014-09-20 MED ORDER — CLOPIDOGREL BISULFATE 300 MG PO TABS
ORAL_TABLET | ORAL | Status: AC
  Filled 2014-09-20: qty 1

## 2014-09-20 MED ORDER — CLOPIDOGREL BISULFATE 75 MG PO TABS
75.0000 mg | ORAL_TABLET | Freq: Every day | ORAL | Status: DC
  Administered 2014-09-21: 09:00:00 75 mg via ORAL
  Filled 2014-09-20: qty 1

## 2014-09-20 MED ORDER — MIDAZOLAM HCL 2 MG/2ML IJ SOLN
INTRAMUSCULAR | Status: DC | PRN
  Administered 2014-09-20: 2 mg via INTRAVENOUS

## 2014-09-20 MED ORDER — TIROFIBAN (AGGRASTAT) BOLUS VIA INFUSION
INTRAVENOUS | Status: DC | PRN
  Administered 2014-09-20: 1882.5 ug via INTRAVENOUS

## 2014-09-20 MED ORDER — SODIUM CHLORIDE 0.9 % IJ SOLN: 3.0000 mL | INTRAMUSCULAR | Status: DC | PRN

## 2014-09-20 MED ORDER — VERAPAMIL HCL 2.5 MG/ML IV SOLN
INTRAVENOUS | Status: AC
  Filled 2014-09-20: qty 2

## 2014-09-20 MED ORDER — FUROSEMIDE 40 MG PO TABS
40.0000 mg | ORAL_TABLET | Freq: Every day | ORAL | Status: DC
  Administered 2014-09-21: 10:00:00 40 mg via ORAL
  Filled 2014-09-20: qty 1

## 2014-09-20 MED ORDER — HEPARIN SODIUM (PORCINE) 1000 UNIT/ML IJ SOLN
INTRAMUSCULAR | Status: AC
  Filled 2014-09-20: qty 1

## 2014-09-20 MED ORDER — HEPARIN (PORCINE) IN NACL 2-0.9 UNIT/ML-% IJ SOLN
INTRAMUSCULAR | Status: AC
  Filled 2014-09-20: qty 1000

## 2014-09-20 MED ORDER — MORPHINE SULFATE 2 MG/ML IJ SOLN
1.0000 mg | INTRAMUSCULAR | Status: DC | PRN
  Administered 2014-09-20: 1 mg via INTRAVENOUS
  Filled 2014-09-20: qty 1

## 2014-09-20 MED ORDER — SODIUM CHLORIDE 0.9 % IJ SOLN: 3.0000 mL | Freq: Two times a day (BID) | INTRAMUSCULAR | Status: DC

## 2014-09-20 MED ORDER — SODIUM CHLORIDE 0.9 % WEIGHT BASED INFUSION: 1.0000 mL/kg/h | INTRAVENOUS | Status: DC

## 2014-09-20 MED ORDER — HEPARIN SODIUM (PORCINE) 1000 UNIT/ML IJ SOLN
INTRAMUSCULAR | Status: DC | PRN
  Administered 2014-09-20: 4000 [IU] via INTRAVENOUS
  Administered 2014-09-20: 2000 [IU] via INTRAVENOUS
  Administered 2014-09-20 (×2): 4000 [IU] via INTRAVENOUS

## 2014-09-20 MED ORDER — FENTANYL CITRATE (PF) 100 MCG/2ML IJ SOLN
INTRAMUSCULAR | Status: DC | PRN
  Administered 2014-09-20: 50 ug via INTRAVENOUS

## 2014-09-20 MED ORDER — NITROGLYCERIN 0.4 MG SL SUBL: 0.4000 mg | SUBLINGUAL_TABLET | SUBLINGUAL | Status: DC | PRN

## 2014-09-20 MED ORDER — SODIUM CHLORIDE 0.9 % WEIGHT BASED INFUSION
3.0000 mL/kg/h | INTRAVENOUS | Status: DC
  Administered 2014-09-20: 3 mL/kg/h via INTRAVENOUS

## 2014-09-20 MED ORDER — IOHEXOL 350 MG/ML SOLN
INTRAVENOUS | Status: DC | PRN
  Administered 2014-09-20: 130 mL via INTRA_ARTERIAL

## 2014-09-20 MED ORDER — CLOPIDOGREL BISULFATE 75 MG PO TABS
ORAL_TABLET | ORAL | Status: DC | PRN
  Administered 2014-09-20: 600 mg via ORAL

## 2014-09-20 SURGICAL SUPPLY — 28 items
BALLN EUPHORA RX 3.0X15 (BALLOONS) ×2
BALLN ~~LOC~~ EUPHORA RX 5.0X15 (BALLOONS) ×2
BALLOON EUPHORA RX 3.0X15 (BALLOONS) ×1 IMPLANT
BALLOON ~~LOC~~ EUPHORA RX 5.0X15 (BALLOONS) ×1 IMPLANT
CATH INFINITI 5 FR IM (CATHETERS) ×2 IMPLANT
CATH INFINITI 5 FR JL3.5 (CATHETERS) IMPLANT
CATH INFINITI 5 FR LCB (CATHETERS) ×2 IMPLANT
CATH INFINITI 5FR AL1 (CATHETERS) ×2 IMPLANT
CATH INFINITI 5FR ANG PIGTAIL (CATHETERS) IMPLANT
CATH INFINITI 5FR MULTPACK ANG (CATHETERS) ×2 IMPLANT
CATH INFINITI JR4 5F (CATHETERS) IMPLANT
DEVICE RAD COMP TR BAND LRG (VASCULAR PRODUCTS) ×2 IMPLANT
GLIDESHEATH SLEND SS 6F .021 (SHEATH) ×2 IMPLANT
GUIDE CATH RUNWAY 6FR FR4 (CATHETERS) ×2 IMPLANT
KIT ENCORE 26 ADVANTAGE (KITS) ×2 IMPLANT
KIT HEART LEFT (KITS) ×2 IMPLANT
PACK CARDIAC CATHETERIZATION (CUSTOM PROCEDURE TRAY) ×2 IMPLANT
SHEATH PINNACLE 5F 10CM (SHEATH) IMPLANT
STENT SYNERGY DES 4X24 (Permanent Stent) ×2 IMPLANT
SYR MEDRAD MARK V 150ML (SYRINGE) ×2 IMPLANT
TRANSDUCER W/STOPCOCK (MISCELLANEOUS) ×2 IMPLANT
TUBING CIL FLEX 10 FLL-RA (TUBING) ×2 IMPLANT
VALVE GUARDIAN II ~~LOC~~ HEMO (MISCELLANEOUS) ×2 IMPLANT
WIRE ASAHI PROWATER 180CM (WIRE) ×2 IMPLANT
WIRE EMERALD 3MM-J .035X150CM (WIRE) IMPLANT
WIRE HI TORQ BMW 190CM (WIRE) ×2 IMPLANT
WIRE HI TORQ VERSACORE-J 145CM (WIRE) ×2 IMPLANT
WIRE SAFE-T 1.5MM-J .035X260CM (WIRE) ×2 IMPLANT

## 2014-09-20 NOTE — Interval H&P Note (Signed)
Cath Lab Visit (complete for each Cath Lab visit)  Clinical Evaluation Leading to the Procedure:   ACS: No.  Non-ACS:    Anginal Classification: CCS III  Anti-ischemic medical therapy: Maximal Therapy (2 or more classes of medications)  Non-Invasive Test Results: Intermediate-risk stress test findings: cardiac mortality 1-3%/year  Prior CABG: Prior CABG 2003  Ischemic Symptoms? CCS III (Marked limitation of ordinary activity) Anti-ischemic Medical Therapy? Maximal Medical Therapy (2 or more classes of medications) Non-invasive Test Results? Intermediate-risk stress test findings: cardiac mortality 1-3%/year Prior CABG? Previous CABG   Patient Information:   >=1 SVG stenosis  A (8)  Indication: 53; Score: 8   Patient Information:   All bypass grafts patent, >=1 lesion(s) in native coronaries without bypass grafts  A (8)  Indication: 53; Score: 8   Patient Information:   Native 3V-CAD, failure of multiple grafts, depressed LVEF, patent LIMA graft PCI  U (6)  Indication: 68; Score: 6   Patient Information:   Native 3V-CAD, failure of multiple grafts, depressed LVEF, patent LIMA graft CABG  A (7)  Indication: 68; Score: 7   Patient Information:   Native 3V-CAD, failure of multiple grafts, depressed LVEF, nonfunctional LIMA graft PCI  A (8)  Indication: 69; Score: 8   Patient Information:   Native 3V-CAD, failure of multiple grafts, depressed LVEF, nonfunctional LIMA graft CABG  U (6)  Indication: 69; Score: 6     History and Physical Interval Note:  09/20/2014 9:38 AM  Mark Clayton  has presented today for surgery, with the diagnosis of c/p  The various methods of treatment have been discussed with the patient and family. After consideration of risks, benefits and other options for treatment, the patient has consented to  Procedure(s): Left Heart Cath and Coronary Angiography (N/A) as a surgical intervention .  The patient's history has been  reviewed, patient examined, no change in status, stable for surgery.  I have reviewed the patient's chart and labs.  Questions were answered to the patient's satisfaction.     Mark Clayton S.

## 2014-09-20 NOTE — Progress Notes (Signed)
TR BAND REMOVAL  LOCATION:    left radial  DEFLATED PER PROTOCOL:    Yes.    TIME BAND OFF / DRESSING APPLIED:   1850  SITE UPON ARRIVAL:    Level 0  SITE AFTER BAND REMOVAL:    Level 1  REVERSE ALLEN'S TEST:     positive  CIRCULATION SENSATION AND MOVEMENT:    Within Normal Limits   Yes.    COMMENTS:   Patient tolerated well. Dressing applied. Small hematoma resolved, bruising noted. Dressing in place C/D/I.

## 2014-09-20 NOTE — H&P (View-Only) (Signed)
Electrophysiology Office Note   Date:  09/14/2014   ID:  YAMIN SWINGLER, DOB 1940/06/16, MRN [id number]  PCP:  Leota Jacobsen, MD  Cardiologist:   Primary Electrophysiologist:  Virl Axe, MD    No chief complaint on file.    History of Present Illness:  ROB MCIVER is a 74 y.o. male is       Seen in followup for an ICD implanted for primary prevention. He had a 6949-lead which was replaced with a right ventricular pace sense lead at the time of generator replacement. Stress testing January 2012 demonstrated mild periinfarct ischemia. EF 30%   Catheterization 2003 demonstrated severe three-vessel disease prompting bypass surgery; he has had no subsequent catheterization He underwent Myoview scanning 6/16 demonstrating severe LV dysfunction and no significant ischemia. Reports the images were reviewed  this was undertaken because of symptoms of progressive exercise intolerance  Echo 10/14 EF 20% there was also RV dysfunction and significant LA enlargement (53/2.7)  He also has a history of an apical clot identified in 2009.  Warfarin was discontinued about a year ago. He was on aspirin but htis was stopped 2/2 ocular injections for macular degeneration  At his last visit imdur and losartan were added and Lasix increased. His blood pressure is much better and his edema is resolved  He continues to complain of exercise intolerance manifested mostly by tightness in his chest with radiation into his neck and accompanied by weakness. There is no significant shortness of breath he has no nocturnal dyspnea or orthopnea. He has had no peripheral edema   Past Medical History  Diagnosis Date  . Ischemic cardiomyopathy     proir bypass. EF 25%. (Master study EF >20%)  . Carotid bruit   . Hyperlipidemia   . CAD (coronary artery disease)     severe 3 vessel CAD w/unstable angina and acute MI  . HTN (hypertension)     systolic  . GERD (gastroesophageal reflux disease) 02-06-11      reflux is controlled-Omeprazole  . Colon cancer 02-06-11     Sigmoid  . Anemia 02-06-11    takes oral iron  . ICD (implantable cardiac defibrillator) in place   . Pacemaker   . CHF (congestive heart failure)   . H/O hiatal hernia   . Cough   . Myocardial infarct     '03-MI  . Arthritis    Past Surgical History  Procedure Laterality Date  . Cardiac defibrillator placement  01/17/03    6949 lead. medtronic. remote-no; with later revision  . Inguinal hernia repair      right  . Coronary artery bypass graft  02-06-11    11'03 6 vessel bypass  . Cataract extraction w/ intraocular lens  implant, bilateral  02-06-11    '09-june/ july-Dr. Katy Fitch  . Colon resection  02/09/2011    Procedure: LAPAROSCOPIC SIGMOID COLON RESECTION;  Surgeon: Pedro Earls, MD;  Location: [facility];  Service: General;  Laterality: N/A;  Laparoscopic Assisted Sigmoid Colectomy  . Colonoscopy  08/31/2011    Procedure: COLONOSCOPY;  Surgeon: Jerene Bears, MD;  Location: [facility];  Service: Gastroenterology;  Laterality: N/A;  . Colonoscopy N/A 09/05/2012    Procedure: COLONOSCOPY;  Surgeon: Jerene Bears, MD;  Location: [facility];  Service: Gastroenterology;  Laterality: N/A;  . Laparoscopic right hemi colectomy N/A 11/04/2012    Procedure: LAPAROSCOPIC RIGHT HEMI COLECTOMY;  Surgeon: Pedro Earls, MD;  Location: [facility];  Service: General;  Laterality: N/A;  .  Colonoscopy N/A 04/18/2013    Procedure: COLONOSCOPY;  Surgeon: Jerene Bears, MD;  [facility]: [facility];  Service: Gastroenterology;  Laterality: N/A;  . Colonoscopy N/A 04/09/2014    Procedure: COLONOSCOPY;  Surgeon: Jerene Bears, MD;  Location: [facility];  Service: Gastroenterology;  Laterality: N/A;     Current Outpatient Prescriptions  Medication Sig Dispense Refill  . acetaminophen (TYLENOL) 500 MG tablet Take 500-1,000 mg by mouth daily as needed for moderate pain or headache.    Marland Kitchen aspirin 81 MG tablet Take 81 mg by mouth daily. At night.     . carvedilol (COREG) 25 MG tablet Take 25 mg by mouth at bedtime.     . furosemide (LASIX) 40 MG tablet Take 1 tablet (40 mg total) by mouth daily. 90 tablet 3  . isosorbide mononitrate (IMDUR) 30 MG 24 hr tablet Take 1 tablet (30 mg total) by mouth daily. 90 tablet 3  . losartan (COZAAR) 50 MG tablet Take 1 tablet (50 mg total) by mouth daily. 90 tablet 3  . meclizine (ANTIVERT) 25 MG tablet Take 25 mg by mouth daily as needed for dizziness.     . nitroGLYCERIN (NITROSTAT) 0.4 MG SL tablet Place 1 tablet (0.4 mg total) under the tongue every 5 (five) minutes as needed for chest pain. Chest pain 25 tablet 1  . omeprazole (PRILOSEC) 20 MG capsule Take 1 capsule (20 mg total) by mouth daily. 90 capsule 0  . pravastatin (PRAVACHOL) 20 MG tablet Take 20 mg by mouth at bedtime.     Marland Kitchen PRESCRIPTION MEDICATION 1 each every 5 (five) weeks. Eye Injection every 5 weeks.     No current facility-administered medications for this visit.    Allergies:   Latex and Tape   Social History:  The patient  reports that he has never smoked. He has never used smokeless tobacco. He reports that he drinks about 4.2 oz of alcohol per week. He reports that he does not use illicit drugs.   Family History:  The patient's family history includes Alzheimer's disease in his mother; Heart disease in his father; Hyperlipidemia in his father and sister; Hypertension in his father and sister; Prostate cancer in his father. There is no history of Colon cancer, Esophageal cancer, Rectal cancer, or Stomach cancer.    ROS:  Please see the history of present illness and past medical history  Otherwise, all other systems were reviewed and were negative .     PHYSICAL EXAM: VS:  BP 128/70 mmHg  Pulse 66  Ht 6' (1.829 m)  Wt 169 lb (76.658 kg)  BMI 22.92 kg/m2  SpO2 97% , BMI Body mass index is 22.92 kg/(m^2). GEN: Well nourished, well developed, in no acute distress HEENT: normal Neck:  JVD flat , carotid bruits, or  masses Cardiac: REGULAR RATE and RHYTHM ;  No murmurs, rubs, No S4  Back without kyphosis; No CVAT Respiratory:  clear to auscultation bilaterally, normal work of breathing GI: soft, nontender, nondistended, + BS MS: no deformity or atrophy Extremities no clubbing cyanosis  edema Skin: warm and dry,   device pocket is well healed without teathering Neuro:  Strength and sensation are intact Psych: euthymic mood, full affect  EKG:  EKG is ordered today. The ekg ordered today shows sinus rhythm at 66  Interval 17/15/44    [place] nnonspecific IVCD    Device interrogation is reviewed today in detail.  See PaceArt for details.   Recent Labs: No results found for  requested labs within last 365 days.    Lipid Panel     Component Value Date/Time   CHOL 158 08/22/2010 1136   TRIG 66 08/22/2010 1136   HDL 59 08/22/2010 1136   CHOLHDL 2.7 08/22/2010 1136   VLDL 13 08/22/2010 1136   LDLCALC 86 08/22/2010 1136     Wt Readings from Last 3 Encounters:  09/14/14 169 lb (76.658 kg)  08/31/14 170 lb (77.111 kg)  08/17/14 168 lb 12.8 oz (76.567 kg)      Other studies Reviewed: Additional studies/ records that were reviewed today include:  Myoview scan 2012 demonstrated large scar without ischemia EF was 29%      ASSESSMENT AND PLAN:  Ischemic cardio myopathy  Implantable defibrillator-Medtronic-single chamber  Hypertension   Bradycardia  Congestive heart failure-chronic-systolic  Angina   he is currently euvolemic. Blood pressure is well-controlled although with his episode the other day of significant chest discomfort it was quite high.  It is now 13 years bypass and the likelihood that there is graft disease not withstanding his Myoview is still quite high. We have talked about proceeding with catheterization. I'm not sure that right heart catheterization is necessary unless it turns out his LVEDP is quite high.      Current medicines are reviewed at  length with the patient today.   The patient does not have concerns regarding his medicines.  The following changes were made today:  none  Labs/ tests ordered today include: As above  No orders of the defined types were placed in this encounter.     Disposition:   FU with me  In 3 months  Signed, Virl Axe, MD  09/14/2014 1:34 PM     [place] [address] [phone] (office) [phone] (fax)

## 2014-09-21 ENCOUNTER — Encounter (HOSPITAL_COMMUNITY): Payer: Self-pay | Admitting: Physician Assistant

## 2014-09-21 DIAGNOSIS — I209 Angina pectoris, unspecified: Secondary | ICD-10-CM

## 2014-09-21 DIAGNOSIS — E785 Hyperlipidemia, unspecified: Secondary | ICD-10-CM | POA: Diagnosis not present

## 2014-09-21 DIAGNOSIS — I255 Ischemic cardiomyopathy: Secondary | ICD-10-CM | POA: Diagnosis not present

## 2014-09-21 DIAGNOSIS — I25119 Atherosclerotic heart disease of native coronary artery with unspecified angina pectoris: Secondary | ICD-10-CM | POA: Diagnosis not present

## 2014-09-21 DIAGNOSIS — Z951 Presence of aortocoronary bypass graft: Secondary | ICD-10-CM | POA: Diagnosis not present

## 2014-09-21 LAB — BASIC METABOLIC PANEL
Anion gap: 9 (ref 5–15)
BUN: 9 mg/dL (ref 6–20)
CO2: 25 mmol/L (ref 22–32)
Calcium: 9 mg/dL (ref 8.9–10.3)
Chloride: 104 mmol/L (ref 101–111)
Creatinine, Ser: 0.95 mg/dL (ref 0.61–1.24)
GFR calc Af Amer: >60 mL/min (ref >60)
GFR calc non Af Amer: >60 mL/min (ref >60)
Glucose, Bld: 108 mg/dL — ABNORMAL HIGH (ref 65–99)
Potassium: 3.5 mmol/L (ref 3.5–5.1)
Sodium: 138 mmol/L (ref 135–145)

## 2014-09-21 LAB — CBC
HCT: 31.3 % — ABNORMAL LOW (ref 39.0–52.0)
Hemoglobin: 9.8 g/dL — ABNORMAL LOW (ref 13.0–17.0)
MCH: 24.1 pg — ABNORMAL LOW (ref 26.0–34.0)
MCHC: 31.3 g/dL (ref 30.0–36.0)
MCV: 77.1 fL — ABNORMAL LOW (ref 78.0–100.0)
Platelets: 158 10*3/uL (ref 150–400)
RBC: 4.06 MIL/uL — ABNORMAL LOW (ref 4.22–5.81)
RDW: 16.4 % — ABNORMAL HIGH (ref 11.5–15.5)
WBC: 5.3 10*3/uL (ref 4.0–10.5)

## 2014-09-21 MED ORDER — PANTOPRAZOLE SODIUM 40 MG PO TBEC: 40.0000 mg | DELAYED_RELEASE_TABLET | Freq: Every day | ORAL | Status: DC

## 2014-09-21 MED ORDER — CLOPIDOGREL BISULFATE 75 MG PO TABS: 75.0000 mg | ORAL_TABLET | Freq: Every day | ORAL | Status: DC

## 2014-09-21 MED ORDER — PANTOPRAZOLE SODIUM 40 MG PO TBEC
40.0000 mg | DELAYED_RELEASE_TABLET | Freq: Every day | ORAL | Status: DC
  Administered 2014-09-21: 40 mg via ORAL
  Filled 2014-09-21: qty 1

## 2014-09-21 MED FILL — Clopidogrel Bisulfate Tab 300 MG (Base Equiv): ORAL | Qty: 2 | Status: AC

## 2014-09-21 MED FILL — Heparin Sodium (Porcine) 2 Unit/ML in Sodium Chloride 0.9%: INTRAMUSCULAR | Qty: 1000 | Status: AC

## 2014-09-21 MED FILL — Nitroglycerin IV Soln 100 MCG/ML in D5W: INTRA_ARTERIAL | Qty: 10 | Status: AC

## 2014-09-21 NOTE — Discharge Instructions (Signed)
PLEASE REMEMBER TO BRING ALL OF YOUR MEDICATIONS TO EACH OF YOUR FOLLOW-UP OFFICE VISITS. ° °PLEASE ATTEND ALL SCHEDULED FOLLOW-UP APPOINTMENTS.  ° °Activity: Increase activity slowly as tolerated. You may shower, but no soaking baths (or swimming) for 1 week. No driving for 2 days. No lifting over 5 lbs for 1 week. No sexual activity for 1 week.  ° °You May Return to Work: in 1 week (if applicable) ° °Wound Care: You may wash cath site gently with soap and water. Keep cath site clean and dry. If you notice pain, swelling, bleeding or pus at your cath site, please call [phone]. ° ° ° °Cardiac Cath Site Care °Refer to this sheet in the next few weeks. These instructions provide you with information on caring for yourself after your procedure. Your caregiver may also give you more specific instructions. Your treatment has been planned according to current medical practices, but problems sometimes occur. Call your caregiver if you have any problems or questions after your procedure. °HOME CARE INSTRUCTIONS °· You may shower 24 hours after the procedure. Remove the bandage (dressing) and gently wash the site with plain soap and water. Gently pat the site dry.  °· Do not apply powder or lotion to the site.  °· Do not sit in a bathtub, swimming pool, or whirlpool for 5 to 7 days.  °· No bending, squatting, or lifting anything over 10 pounds (4.5 kg) as directed by your caregiver.  °· Inspect the site at least twice daily.  °· Do not drive home if you are discharged the same day of the procedure. Have someone else drive you.  °· You may drive 24 hours after the procedure unless otherwise instructed by your caregiver.  °What to expect: °· Any bruising will usually fade within 1 to 2 weeks.  °· Blood that collects in the tissue (hematoma) may be painful to the touch. It should usually decrease in size and tenderness within 1 to 2 weeks.  °SEEK IMMEDIATE MEDICAL CARE IF: °· You have unusual pain at the site or down the  affected limb.  °· You have redness, warmth, swelling, or pain at the site.  °· You have drainage (other than a small amount of blood on the dressing).  °· You have chills.  °· You have a fever or persistent symptoms for more than 72 hours.  °· You have a fever and your symptoms suddenly get worse.  °· Your leg becomes pale, cool, tingly, or numb.  °· You have heavy bleeding from the site. Hold pressure on the site.  °Document Released: 04/18/2010 Document Revised: 03/05/2011 Document Reviewed: 04/18/2010 °ExitCare® Patient Information ©2012 [employer]. ° °

## 2014-09-21 NOTE — Progress Notes (Signed)
CARDIAC REHAB PHASE I   PRE:  Rate/Rhythm: 66 SR  BP:  Supine: 158/78  Sitting:   Standing:    SaO2:   MODE:  Ambulation: 1000 ft   POST:  Rate/Rhythm: 97 SR  BP:  Supine:   Sitting: 181/78 automatic cuff ,,  158/80 manual Standing:    SaO2:  0750-0850 Pt walked 1000 ft with steady gait. No CP. Tolerated well. Education completed with pt who voiced understanding. Stressed importance of plavix with stent. Discussed CRP 2 and pt gave permission to refer to [facility] program. Pt knows to watch salt and we reviewed heart healthy food choices. Pt walks about 2 miles a day. Gave instructions on how to get back to walking for exercise.  Graylon Good, RN BSN  09/21/2014 8:46 AM

## 2014-09-21 NOTE — Discharge Summary (Signed)
CARDIOLOGY DISCHARGE SUMMARY   Patient ID: Mark Clayton MRN: [id number] DOB/AGE: 74-26-1942 74 y.o.  Admit date: 09/20/2014 Discharge date: 09/21/2014  PCP: Mark Jacobsen, MD Primary Cardiologist: Dr. Caryl Comes  Primary Discharge Diagnosis:  Angina pectoris Secondary Discharge Diagnosis:    Cardiomyopathy, ischemic   Anemia  Procedures: Cardiac catheterization, coronary arteriogram, LIMA arteriogram, vein graft angiogram, Synergy 4 x 24 mm DES to the [facility]  Hospital Course: Mark Clayton is a 74 y.o. male with a history of CABG 2003, Medtronic ICD with RV pacing lead implanted 2010 (primary prevention), anemia, ischemic cardiomyopathy with an EF of 20% in 0938, chronic systolic CHF, hypertension and hyperlipidemia. He was seen by Dr. Caryl Comes and had been having consistent chest tightness with exertion. He was scheduled for cardiac catheterization and came to the hospital for the procedure on 09/20/2014.  Cardiac catheterization results are below. He has severe native three-vessel disease including a 90% RCA, but his LIMA-LAD and SVG-OM2-OM3 were patent. However, the SVG-RPDA-RPLB was occluded between the RPDA and the RPLB. The native RCA had a 90% stenosis. Dr. Beau Fanny treated the native RCA with a drug-eluting stent reducing the stenosis to 0. The SVG-RPDA portion of the graft is still patent. He tolerated the procedure well.  After the procedure, he had a spontaneous bleed at his left radial cath site. He was treated by the nursing staff, the bleeding was eventually stopped and the site stabilized.  On 09/21/2014, he was seen by Dr. Burt Knack and all data were reviewed. He has a history of anemia, and his hemoglobin had dropped slightly post-procedure, but he was asymptomatic with this. He is to follow-up with his family physician for this and continue current therapy. Because of his stent, he is on Plavix and therefore the omeprazole was discontinued. He was started on  pantoprazole. He is on aspirin, statin, beta blocker and Plavix.   He had no chest pain or shortness of breath. He was ambulating well. His cath site had some mild ecchymosis, but was stable. No further inpatient workup is indicated and he is considered stable for discharge, to follow up as an outpatient.  BP 146/82 mmHg  Pulse 68  Temp(Src) 97.9 F (36.6 C) (Oral)  Resp 19  Ht 6' (1.829 m)  Wt 175 lb 11.3 oz (79.7 kg)  BMI 23.82 kg/m2  SpO2 97% General: Well developed, well nourished, male in no acute distress Head: Eyes PERRLA, No xanthomas.   Normocephalic and atraumatic  Lungs: Clear bilaterally to auscultation. Heart: HRRR S1 S2, without MRG.  Pulses are 2+ & equal.  No JVD. Abdomen: Bowel sounds are present, abdomen soft and non-tender without masses or  hernias noted. Msk: Normal strength and tone for age. Extremities: No clubbing, cyanosis or edema. Left radial cath site with minimal edema and ecchymosis proximal to the site, dressing is clean and dry, not removed   Skin:  No rashes or lesions noted. Neuro: Alert and oriented X 3. Psych:  Good affect, responds appropriately   Labs:   Lab Results  Component Value Date   WBC 5.3 09/21/2014   HGB 9.8* 09/21/2014   HCT 31.3* 09/21/2014   MCV 77.1* 09/21/2014   PLT 158 09/21/2014     Recent Labs Lab 09/21/14 0303  NA 138  K 3.5  CL 104  CO2 25  BUN 9  CREATININE 0.95  CALCIUM 9.0  GLUCOSE 108*    Recent Labs  09/20/14 0757  INR 1.05    Cardiac  Cath: 09/20/2014 Coronary Findings    Dominance: Co-dominant   Left Main   . LM lesion, 20% stenosed.     Left Anterior Descending  The vessel is small .   Marland Kitchen Mid LAD-1 lesion, 80% stenosed. The lesion is type C calcified .   Marland Kitchen Mid LAD-2 lesion, 95% stenosed.   Jorene Minors LAD lesion, 50% stenosed.   . First Diagonal Branch   1st Diag filled by collaterals from 2nd Diag.   Colon Flattery 1st Diag lesion, 100% stenosed.   . First Septal Branch   The vessel is small in  size.     Left Circumflex   . Mid Cx lesion, 100% stenosed.   . First Obtuse Marginal Branch   The vessel exhibits minimal luminal irregularities.   . Second Obtuse Marginal Branch   . 2nd Mrg lesion, 100% stenosed.   . Third Obtuse Marginal Branch   . 3rd Mrg lesion, 90% stenosed.     Right Coronary Artery  The vessel is large .   Marland Kitchen Prox RCA lesion, 20% stenosed.   . Mid RCA lesion, 10% stenosed.   . Dist RCA-1 lesion, 40% stenosed.   Marland Kitchen PCI: The pre-interventional distal flow is normal (TIMI 3). Pre-stent angioplasty was performed. A drug-eluting stent was placed. The strut is apposed. Post-stent angioplasty was performed. The post-interventional distal flow is normal (TIMI 3). The intervention was successful. No complications occurred at this lesion.  . Supplies used: STENT SYNERGY DES 4X24; BALLOON [place] [id number]  . There is a 5% residual stenosis post intervention.     . Dist RCA-2 lesion, 90% stenosed. The lesion is type C calcified .   Marland Kitchen PCI: The pre-interventional distal flow is normal (TIMI 3). Pre-stent angioplasty was performed. A drug-eluting stent was placed. The strut is apposed. Post-stent angioplasty was performed. The post-interventional distal flow is normal (TIMI 3). The intervention was successful. No complications occurred at this lesion.  . Supplies used: STENT SYNERGY DES 4X24; BALLOON [place] [id number]  . There is a 5% residual stenosis post intervention.     . Right Posterior Descending Artery   . RPDA lesion, 100% stenosed.     Graft Angiography    Sequential single graft Graft to 2nd Mrg, 3rd Mrg  SVG The graft exhibits minimal luminal irregularities.     LIMA Graft to Dist LAD  was injected is large, and is anatomically normal.     Sequential single graft Graft to RPDA, 1st RPLB  SVG   . Prox Graft to Mid Graft lesion between RPDA and 1st RPLB, 100% stenosed.          Left Heart    Aortic Valve There is no aortic valve stenosis.     Conclusion     Severe native three-vessel coronary artery disease. Patent LIMA to LAD. Patent jump graft SVG to OM 2 and OM 3. Patent SVG to PDA. The second portion of this graft which goes to the posterior lateral artery is occluded.  90% distal RCA stenosis which supplies the posterior lateral territory. Successful 4.0 x 24 Synergy drug-eluting stent placement to the distal right coronary artery, postdilated to 5 mm in diameter..  Continue dual antiplatelets therapy for at least a year. Continue aggressive secondary prevention. He'll be watched overnight. Possible discharge tomorrow.     EKG: Sinus rhythm, LVH and some QRS widening  FOLLOW UP PLANS AND APPOINTMENTS Allergies  Allergen Reactions  . Latex Rash  . Tape Rash  USE PAPER     Medication List    STOP taking these medications        omeprazole 20 MG capsule  Commonly known as:  PRILOSEC      TAKE these medications        acetaminophen 500 MG tablet  Commonly known as:  TYLENOL  Take 500-1,000 mg by mouth daily as needed for moderate pain or headache.     aspirin 81 MG tablet  Take 81 mg by mouth daily. At night.     carvedilol 25 MG tablet  Commonly known as:  COREG  Take 25 mg by mouth at bedtime.     clopidogrel 75 MG tablet  Commonly known as:  PLAVIX  Take 1 tablet (75 mg total) by mouth daily with breakfast.     furosemide 40 MG tablet  Commonly known as:  LASIX  Take 1 tablet (40 mg total) by mouth daily.     isosorbide mononitrate 30 MG 24 hr tablet  Commonly known as:  IMDUR  Take 1 tablet (30 mg total) by mouth daily.     losartan 50 MG tablet  Commonly known as:  COZAAR  Take 1 tablet (50 mg total) by mouth daily.     meclizine 25 MG tablet  Commonly known as:  ANTIVERT  Take 25 mg by mouth daily as needed for dizziness.     nitroGLYCERIN 0.4 MG SL tablet  Commonly known as:  NITROSTAT  Place 1 tablet (0.4 mg total) under the tongue every 5 (five) minutes as needed for chest  pain. Chest pain     pantoprazole 40 MG tablet  Commonly known as:  PROTONIX  Take 1 tablet (40 mg total) by mouth daily.     pravastatin 20 MG tablet  Commonly known as:  PRAVACHOL  Take 20 mg by mouth at bedtime.     PRESCRIPTION MEDICATION  1 each every 5 (five) weeks. Eye Injection every 5 weeks.         Follow-up Information    Follow up with Mark Axe, MD.   Specialty:  Cardiology   Why:  The office will call.   Contact information:   [address] [address] [phone]       BRING ALL MEDICATIONS WITH YOU TO FOLLOW UP APPOINTMENTS  Time spent with patient to include physician time: 40 min Signed: Rosaria Ferries, PA-C 09/21/2014, 7:55 AM Co-Sign MD  Patient seen, examined. Available data reviewed. Agree with findings, assessment, and plan as outlined by Rosaria Ferries, PA-C. Pt looks good this am. Left radial site clear except for mild ecchymoses. No edema. Stressed importance of med adherence with DAPT. He understands. OP FU with Dr Caryl Comes. Post-PCI restrictions reviewed with patient.  Sherren Mocha, M.D. 09/21/2014 8:38 AM

## 2014-09-24 ENCOUNTER — Telehealth: Payer: Self-pay | Admitting: Internal Medicine

## 2014-09-24 NOTE — Telephone Encounter (Signed)
Calling saying he was d/c Friday and wanted to know when his next appointment would be with Dr. Caryl Comes.  He had a heart cath by Dr. Burt Knack and stent.  States (L) radial site healing. No redness or swelling or bleeding.  States he is feeling good. Has all of his medications and is taking as instructed. Advised will send note to schedulers regarding his follow up appointment.  Advised he probably will be seen by a NP or PA for post hospital and then to see Dr. Caryl Comes as recall. He verbalizes understanding and doesn't have any questions regarding care.

## 2014-09-24 NOTE — Telephone Encounter (Signed)
New message      Pt has questions regarding recent surgery and hospital stay.  Please advise

## 2014-10-05 ENCOUNTER — Encounter: Payer: Self-pay | Admitting: Physician Assistant

## 2014-10-05 ENCOUNTER — Ambulatory Visit (INDEPENDENT_AMBULATORY_CARE_PROVIDER_SITE_OTHER): Payer: Medicare HMO | Admitting: Physician Assistant

## 2014-10-05 ENCOUNTER — Encounter: Payer: Self-pay | Admitting: Internal Medicine

## 2014-10-05 VITALS — BP 100/60 | HR 72 | Ht 72.0 in | Wt 167.1 lb

## 2014-10-05 DIAGNOSIS — I5022 Chronic systolic (congestive) heart failure: Secondary | ICD-10-CM | POA: Diagnosis not present

## 2014-10-05 DIAGNOSIS — Z9861 Coronary angioplasty status: Secondary | ICD-10-CM | POA: Diagnosis not present

## 2014-10-05 DIAGNOSIS — I2510 Atherosclerotic heart disease of native coronary artery without angina pectoris: Secondary | ICD-10-CM | POA: Diagnosis not present

## 2014-10-05 DIAGNOSIS — E785 Hyperlipidemia, unspecified: Secondary | ICD-10-CM

## 2014-10-05 DIAGNOSIS — D509 Iron deficiency anemia, unspecified: Secondary | ICD-10-CM

## 2014-10-05 DIAGNOSIS — I10 Essential (primary) hypertension: Secondary | ICD-10-CM | POA: Diagnosis not present

## 2014-10-05 LAB — CBC WITH DIFFERENTIAL/PLATELET
Basophils Absolute: 0 10*3/uL (ref 0.0–0.1)
Basophils Relative: 0.4 % (ref 0.0–3.0)
Eosinophils Absolute: 0.3 10*3/uL (ref 0.0–0.7)
Eosinophils Relative: 4.2 % (ref 0.0–5.0)
HCT: 34.3 % — ABNORMAL LOW (ref 39.0–52.0)
Hemoglobin: 11 g/dL — ABNORMAL LOW (ref 13.0–17.0)
Lymphocytes Relative: 22.7 % (ref 12.0–46.0)
Lymphs Abs: 1.4 10*3/uL (ref 0.7–4.0)
MCHC: 32 g/dL (ref 30.0–36.0)
MCV: 76.9 fl — ABNORMAL LOW (ref 78.0–100.0)
Monocytes Absolute: 0.7 10*3/uL (ref 0.1–1.0)
Monocytes Relative: 12.3 % — ABNORMAL HIGH (ref 3.0–12.0)
Neutro Abs: 3.6 10*3/uL (ref 1.4–7.7)
Neutrophils Relative %: 60.4 % (ref 43.0–77.0)
Platelets: 218 10*3/uL (ref 150.0–400.0)
RBC: 4.47 Mil/uL (ref 4.22–5.81)
RDW: 18.1 % — ABNORMAL HIGH (ref 11.5–15.5)
WBC: 6 10*3/uL (ref 4.0–10.5)

## 2014-10-05 NOTE — Patient Instructions (Addendum)
Medication Instructions:  Your physician recommends that you continue on your current medications as directed. Please refer to the Current Medication list given to you today.   Labwork: Your physician recommends that you have labs today. CBC  Testing/Procedures: NONE  Follow-Up: Your physician recommends that you keep your scheduled appointment with Dr. Gari Crown will receive a call from Elberta Leatherwood our pharmacist about [place].

## 2014-10-05 NOTE — Progress Notes (Signed)
Cardiology Office Note Date:  10/05/2014  Patient ID:  Mark, Clayton Oct 23, 1940, MRN [id number] PCP:  Leota Jacobsen, MD  Cardiologist: Caryl Comes   Chief Complaint: f/u cath  History of Present Illness: Mark Clayton is a 74 y.o. male with history of CAD (s/p CABG 2003, recent DES to SVG-RPDA-RPLB), Medtronic ICD with RV pacing lead implanted 2010 (primary prevention), anemia, ischemic cardiomyopathy with an EF of 20% in 6283, chronic systolic CHF, hypertension, colon CA s/p colectomy, and hyperlipidemia who presents for post-hospital follow-up. He recently saw Dr. Caryl Comes as an outpatient and was reporting chest tightness with exertion. He presented for outpatient cath 09/20/14 and was found to have native three-vessel disease including a 90% RCA, but his LIMA-LAD and SVG-OM2-OM3 were patent. However, the SVG-RPDA-RPLB was occluded between the RPDA and the RPLB. The native RCA had a 90% stenosis. Dr. Beau Fanny treated the native RCA with a drug-eluting stent reducing the stenosis to 0. The SVG-RPDA portion of the graft is still patent. After the procedure, he had a spontaneous bleed at his left radial cath site. He was treated by the nursing staff, the bleeding was eventually stopped and the site stabilized. Hemoglobin dropped slightly post-procedure but he was asymptomatic (10.9 pre-cath 9.8 post-cath) - microcytic with MCV 77. Last Hgb in 03/2013 was 10.9. It was recommended he continue DAPT for at least 1 year (on Plavix). No LV gram done with cath. Last echo 12/2012 EF 20% with mural thrombus, mod-severe left atrial enlargement. Per Dr. Olin Pia notes from that time, echo in 2009 demonstrated something similar - he had been on warfarin following prior MI and this was stopped after a number of months. Given lack of data regarding anticoagulation from chronic clot, he has not been on anticoagulation since.  He returns for follow-up today doing great. He is very active doing woodworking, Health visitor, and  lawn care. He has not had any recurrent CP or SOB. No orthopnea, LEE, syncope or bleeding. Cath site has remained stable since discharge. Brings in a log of BPs - mostly 662-947M/54-65K systolic. He says sometimes it runs a little lower in the morning after taking Lasix (like today) but he has been asymptomatic with this and is happy with his med regimen.   Past Medical History  Diagnosis Date  . Ischemic cardiomyopathy     a. EF 20% in 2014. (Master study EF >20%)  . Carotid bruit   . Hyperlipidemia   . CAD (coronary artery disease) 2003    a. h/o MI and CABG in 2003. b. s/p DES to SVG-RPDA-RPLB in 08/2014.  Marland Kitchen HTN (hypertension)   . GERD (gastroesophageal reflux disease) 02-06-11  . Anemia 02-06-11    takes oral iron  . Chronic systolic CHF (congestive heart failure)     a. EF 20% in 2014.  Marland Kitchen Cough   . AICD (automatic cardioverter/defibrillator) present 01/17/2003    Medtronic Maximo 7232CX ICD, serial [id number]  . Myocardial infarct     2003  . Arthritis     hands, knees  . Cancer of sigmoid colon 2012  . LV (left ventricular) mural thrombus     a. Last echo 12/2012 EF 20% with mural thrombus, mod-severe left atrial enlargement. Per Dr. Olin Pia notes from that time, echo in 2009 demonstrated something similar - he had been on warfarin following prior MI and this was stopped after a number of months. Given lack of data regarding anticoagulation from chronic clot, he has not been on anticoagulation since.  Past Surgical History  Procedure Laterality Date  . Cardiac defibrillator placement  01/17/03    6949 lead. Medtronic. remote-no; with later revision  . Cataract extraction w/ intraocular lens  implant, bilateral Bilateral June/-July 2009    Dr. Katy Fitch  . Colon resection  02/09/2011    Procedure: LAPAROSCOPIC SIGMOID COLON RESECTION;  Surgeon: Pedro Earls, MD;  Location: [facility];  Service: General;  Laterality: N/A;  Laparoscopic Assisted Sigmoid Colectomy  . Colonoscopy   08/31/2011    Procedure: COLONOSCOPY;  Surgeon: Jerene Bears, MD;  Location: [facility];  Service: Gastroenterology;  Laterality: N/A;  . Colonoscopy N/A 09/05/2012    Procedure: COLONOSCOPY;  Surgeon: Jerene Bears, MD;  Location: [facility];  Service: Gastroenterology;  Laterality: N/A;  . Laparoscopic right hemi colectomy N/A 11/04/2012    Procedure: LAPAROSCOPIC RIGHT HEMI COLECTOMY;  Surgeon: Pedro Earls, MD;  Location: [facility];  Service: General;  Laterality: N/A;  . Colonoscopy N/A 04/18/2013    Procedure: COLONOSCOPY;  Surgeon: Jerene Bears, MD;  Location: [facility];  Service: Gastroenterology;  Laterality: N/A;  . Colonoscopy N/A 04/09/2014    Procedure: COLONOSCOPY;  Surgeon: Jerene Bears, MD;  Location: [facility];  Service: Gastroenterology;  Laterality: N/A;  . Coronary artery bypass graft  01/2002    LIMA-LAD, SVG-OM 2-OM 3, SVG-RPDA-RPLB  . Colon surgery    . Tonsillectomy  ~ 1950  . Inguinal hernia repair Right 2000's X 2  . Icd generator change  2010    Medtronic Virtuoso II VR ICD  . Cardiac catheterization N/A 09/20/2014    Procedure: Left Heart Cath and Coronary Angiography;  Surgeon: Jettie Booze, MD; LAD 95%, D1 100%, CFX liner percent, OM 200%, OM 390%, RCA 90%, LIMA-LAD okay, SVG-OM 2-OM 3 minimal disease, SVG-RPDA-RPLB 100% between the RPDA and RPL     . Cardiac catheterization N/A 09/20/2014    Procedure: Coronary Stent Intervention;  Surgeon: Jettie Booze, MD; Synergy DES 4 x 24 mm reducing the stenosis to 5%   . Coronary angioplasty      Current Outpatient Prescriptions  Medication Sig Dispense Refill  . acetaminophen (TYLENOL) 500 MG tablet Take 500-1,000 mg by mouth daily as needed for moderate pain or headache.    Marland Kitchen aspirin 81 MG tablet Take 81 mg by mouth daily. At night.    . carvedilol (COREG) 25 MG tablet Take 25 mg by mouth at bedtime.     . clopidogrel (PLAVIX) 75 MG tablet Take 1 tablet (75 mg total) by mouth daily with breakfast. 90  tablet 3  . furosemide (LASIX) 40 MG tablet Take 1 tablet (40 mg total) by mouth daily. 90 tablet 3  . isosorbide mononitrate (IMDUR) 30 MG 24 hr tablet Take 1 tablet (30 mg total) by mouth daily. 90 tablet 3  . losartan (COZAAR) 50 MG tablet Take 1 tablet (50 mg total) by mouth daily. 90 tablet 3  . meclizine (ANTIVERT) 25 MG tablet Take 25 mg by mouth daily as needed for dizziness.     . nitroGLYCERIN (NITROSTAT) 0.4 MG SL tablet Place 1 tablet (0.4 mg total) under the tongue every 5 (five) minutes as needed for chest pain. Chest pain 25 tablet 1  . pantoprazole (PROTONIX) 40 MG tablet Take 1 tablet (40 mg total) by mouth daily. 90 tablet 3  . pravastatin (PRAVACHOL) 20 MG tablet Take 20 mg by mouth at bedtime.     Marland Kitchen PRESCRIPTION MEDICATION 1 each every 5 (five) weeks.  Eye Injection every 5 weeks.     No current facility-administered medications for this visit.    Allergies:   Latex and Tape   Social History:  The patient  reports that he has never smoked. He has never used smokeless tobacco. He reports that he drinks about 4.2 oz of alcohol per week. He reports that he does not use illicit drugs.   Family History:  The patient's family history includes Alzheimer's disease in his mother; Heart disease in his father; Hyperlipidemia in his father and sister; Hypertension in his father and sister; Prostate cancer in his father. There is no history of Colon cancer, Esophageal cancer, Rectal cancer, or Stomach cancer.  ROS:  Please see the history of present illness.  All other systems are reviewed and otherwise negative.   PHYSICAL EXAM:  VS:  BP 100/60 mmHg  Pulse 72  Ht 6' (1.829 m)  Wt 167 lb 1.9 oz (75.805 kg)  BMI 22.66 kg/m2 BMI: Body mass index is 22.66 kg/(m^2). Well nourished, well developed WM, in no acute distress HEENT: normocephalic, atraumatic Neck: no JVD, carotid bruits or masses Cardiac:  normal S1. Physiologic split S2. RRR; no murmurs, rubs, or gallops Lungs:  clear  to auscultation bilaterally, no wheezing, rhonchi or rales Abd: soft, nontender, no hepatomegaly, + BS MS: no deformity or atrophy Ext: no edema, left radial cath site with mild resolving ecchymosis; good pulse Skin: warm and dry, no rash Neuro:  moves all extremities spontaneously, no focal abnormalities noted, follows commands Psych: euthymic mood, full affect   EKG:  Done today shows NSR with occasional PAC, PVC, LVH with QRS widening and repolarization abnormality  Recent Labs: 09/21/2014: BUN 9; Creatinine, Ser 0.95; Hemoglobin 9.8*; Platelets 158; Potassium 3.5; Sodium 138  No results found for requested labs within last 365 days.   Estimated Creatinine Clearance: 74.2 mL/min (by C-G formula based on Cr of 0.95).   Wt Readings from Last 3 Encounters:  10/05/14 167 lb 1.9 oz (75.805 kg)  09/21/14 175 lb 11.3 oz (79.7 kg)  09/14/14 169 lb (76.658 kg)     Other studies reviewed: Additional studies/records reviewed today include: summarized above  ASSESSMENT AND PLAN:  1. CAD with history of CABG 2003, s/p recent PCI as above - doing well. Continue DAPT at least 1 year. Continue BB and statin. See below regarding statin. 2. Essential HTN - controlled. As above he has intermittent low BP in the mornings after taking Lasix. As such, it appears his Coreg is dosed only QPM. Daytime BPs run 120s-130s/60s-70s. He does not wish to make any medicine changes today. Continue current regimen. 3. Hyperlipidemia - he reports intolerance to "just about every statin around" except is tolerating Pravastatin. We discussed referral to lipid clinic to discuss PCSK-9 inhibitors. He would like to talk to the pharmacist on the phone about this. Nursing will send Gay Filler a message to see if we can facilitate this. Last lipid panel was in January at [facility] office apparently. If he is interested in going forward with Praluent he will need updated Clayton. LDL was 114 back in 2013.  4. Chronic systolic CHF -  euvolemic. Continue current regimen. Will defer timing of any follow-up echoes to Dr. Caryl Comes. 5. Microcytic anemia - chronic. Patient reports regular screening colonoscopies with a good report on the last scan. Decrease in  Hgb in hospital likely due to bleeding from cath site. However, since he is new to Plavix recently will recheck just to ensure stability.  Disposition:  F/u with Dr. Caryl Comes as scheduled in 11/2014.  Current medicines are reviewed at length with the patient today.  The patient did not have any concerns regarding medicines.  Raechel Ache PA-C 10/05/2014 12:05 PM     [place] [address] [phone] (office)  [phone] (fax)

## 2014-10-09 ENCOUNTER — Telehealth: Payer: Self-pay | Admitting: *Deleted

## 2014-10-09 DIAGNOSIS — D6489 Other specified anemias: Secondary | ICD-10-CM

## 2014-10-09 NOTE — Telephone Encounter (Signed)
Informed patient lab results from 6/17 discussed with Dr. Hilarie Fredrickson.  He agreed with FOBT, IBC panel, and ferritin level follow up. Patient agreed to come by office on Friday 7/15 for blood work and pick up FOBT test kit.  Advised him to ask for Dr. Olin Pia nurse on Friday and I will review FOBT testing with him. Patient verbalized understanding and agreeable to plan.

## 2014-10-12 ENCOUNTER — Other Ambulatory Visit (INDEPENDENT_AMBULATORY_CARE_PROVIDER_SITE_OTHER): Payer: Medicare HMO | Admitting: *Deleted

## 2014-10-12 DIAGNOSIS — D6489 Other specified anemias: Secondary | ICD-10-CM

## 2014-10-12 DIAGNOSIS — D509 Iron deficiency anemia, unspecified: Secondary | ICD-10-CM

## 2014-10-12 LAB — IBC PANEL
Iron: 30 ug/dL — ABNORMAL LOW (ref 42–165)
Saturation Ratios: 6.3 % — ABNORMAL LOW (ref 20.0–50.0)
Transferrin: 338 mg/dL (ref 212.0–360.0)

## 2014-10-12 LAB — FERRITIN: Ferritin: 9.8 ng/mL — ABNORMAL LOW (ref 22.0–322.0)

## 2014-10-12 NOTE — Addendum Note (Signed)
Addended by: Eulis Foster on: 10/12/2014 08:50 AM   Modules accepted: Orders

## 2014-10-17 ENCOUNTER — Encounter: Payer: Self-pay | Admitting: Physician Assistant

## 2014-10-22 ENCOUNTER — Other Ambulatory Visit: Payer: Medicare HMO

## 2014-10-22 ENCOUNTER — Other Ambulatory Visit: Payer: Self-pay | Admitting: *Deleted

## 2014-10-22 ENCOUNTER — Other Ambulatory Visit: Payer: Medicare HMO | Admitting: *Deleted

## 2014-10-22 ENCOUNTER — Other Ambulatory Visit: Payer: Self-pay | Admitting: Nurse Practitioner

## 2014-10-22 ENCOUNTER — Other Ambulatory Visit: Payer: Self-pay

## 2014-10-22 DIAGNOSIS — Z85038 Personal history of other malignant neoplasm of large intestine: Secondary | ICD-10-CM

## 2014-10-22 DIAGNOSIS — Z86010 Personal history of colonic polyps: Secondary | ICD-10-CM

## 2014-10-22 DIAGNOSIS — Z1211 Encounter for screening for malignant neoplasm of colon: Secondary | ICD-10-CM

## 2014-10-22 DIAGNOSIS — D509 Iron deficiency anemia, unspecified: Secondary | ICD-10-CM

## 2014-10-22 LAB — HEMOCCULT SLIDES (X 3 CARDS)
Fecal Occult Blood: NEGATIVE
OCCULT 1: NEGATIVE
OCCULT 2: NEGATIVE
OCCULT 3: NEGATIVE
OCCULT 4: NEGATIVE
OCCULT 5: NEGATIVE

## 2014-10-23 ENCOUNTER — Other Ambulatory Visit: Payer: Medicare HMO

## 2014-10-24 ENCOUNTER — Encounter: Payer: Self-pay | Admitting: Internal Medicine

## 2014-12-11 ENCOUNTER — Encounter: Payer: Medicare HMO | Admitting: Internal Medicine

## 2015-01-01 ENCOUNTER — Encounter: Payer: Self-pay | Admitting: *Deleted

## 2015-01-04 ENCOUNTER — Other Ambulatory Visit (INDEPENDENT_AMBULATORY_CARE_PROVIDER_SITE_OTHER): Payer: Medicare HMO

## 2015-01-04 ENCOUNTER — Ambulatory Visit (INDEPENDENT_AMBULATORY_CARE_PROVIDER_SITE_OTHER): Payer: Medicare HMO | Admitting: Internal Medicine

## 2015-01-04 ENCOUNTER — Encounter: Payer: Self-pay | Admitting: Internal Medicine

## 2015-01-04 VITALS — BP 132/74 | HR 59 | Temp 98.6°F | Resp 16 | Ht 72.0 in | Wt 168.4 lb

## 2015-01-04 DIAGNOSIS — I5022 Chronic systolic (congestive) heart failure: Secondary | ICD-10-CM

## 2015-01-04 DIAGNOSIS — D509 Iron deficiency anemia, unspecified: Secondary | ICD-10-CM | POA: Diagnosis not present

## 2015-01-04 DIAGNOSIS — Z23 Encounter for immunization: Secondary | ICD-10-CM

## 2015-01-04 DIAGNOSIS — E785 Hyperlipidemia, unspecified: Secondary | ICD-10-CM

## 2015-01-04 DIAGNOSIS — Z85038 Personal history of other malignant neoplasm of large intestine: Secondary | ICD-10-CM | POA: Diagnosis not present

## 2015-01-04 LAB — COMPREHENSIVE METABOLIC PANEL
ALT: 12 U/L (ref 0–53)
AST: 17 U/L (ref 0–37)
Albumin: 4.2 g/dL (ref 3.5–5.2)
Alkaline Phosphatase: 55 U/L (ref 39–117)
BUN: 15 mg/dL (ref 6–23)
CO2: 32 mEq/L (ref 19–32)
Calcium: 9.7 mg/dL (ref 8.4–10.5)
Chloride: 106 mEq/L (ref 96–112)
Creatinine, Ser: 1.2 mg/dL (ref 0.40–1.50)
GFR: 62.89 mL/min (ref >60.00)
Glucose, Bld: 87 mg/dL (ref 70–99)
Potassium: 4.6 mEq/L (ref 3.5–5.1)
Sodium: 143 mEq/L (ref 135–145)
Total Bilirubin: 0.9 mg/dL (ref 0.2–1.2)
Total Protein: 7.2 g/dL (ref 6.0–8.3)

## 2015-01-04 LAB — CBC
HCT: 36.4 % — ABNORMAL LOW (ref 39.0–52.0)
Hemoglobin: 11.8 g/dL — ABNORMAL LOW (ref 13.0–17.0)
MCHC: 32.5 g/dL (ref 30.0–36.0)
MCV: 78.2 fl (ref 78.0–100.0)
Platelets: 177 10*3/uL (ref 150.0–400.0)
RBC: 4.66 Mil/uL (ref 4.22–5.81)
RDW: 16.9 % — ABNORMAL HIGH (ref 11.5–15.5)
WBC: 4.9 10*3/uL (ref 4.0–10.5)

## 2015-01-04 LAB — LIPID PANEL
Cholesterol: 185 mg/dL (ref 0–200)
HDL: 52.7 mg/dL (ref >39.00)
LDL Cholesterol: 109 mg/dL — ABNORMAL HIGH (ref 0–99)
NonHDL: 132.76
Total CHOL/HDL Ratio: 4
Triglycerides: 117 mg/dL (ref 0.0–149.0)
VLDL: 23.4 mg/dL (ref 0.0–40.0)

## 2015-01-04 NOTE — Progress Notes (Signed)
   Subjective:    Patient ID: Mark Clayton, male    DOB: 1941/02/10, 74 y.o.   MRN: [id number]  HPI The patient is a new 74 YO man coming in for follow up of his cholesterol. It is usually high and he has been unable to tolerate any statins except the pravachol that he is taking now. Has had a lot of trouble with muscle aches with other statins or higher doses. Has significant PMH for cardiac disease and most recent stent was this June. He is on dual antiplatelet therapy current and chest pain free now.   PMH, [facility], social history reviewed and updated.   Review of Systems  Constitutional: Negative for fever, activity change, appetite change and fatigue.  HENT: Negative.   Eyes: Negative.   Respiratory: Negative for cough, chest tightness, shortness of breath and wheezing.   Cardiovascular: Negative for chest pain, palpitations and leg swelling.  Gastrointestinal: Negative for nausea, abdominal pain, diarrhea, constipation and abdominal distention.  Musculoskeletal: Negative.   Skin: Negative.   Neurological: Negative.   Psychiatric/Behavioral: Negative.       Objective:   Physical Exam  Constitutional: He is oriented to person, place, and time. He appears well-developed and well-nourished.  HENT:  Head: Normocephalic and atraumatic.  Eyes: EOM are normal.  Neck: Normal range of motion.  Cardiovascular: Normal rate and regular rhythm.   Pulmonary/Chest: Effort normal and breath sounds normal.  Abdominal: Soft. Bowel sounds are normal. He exhibits no distension. There is no tenderness. There is no rebound.  Musculoskeletal: He exhibits no edema.  Neurological: He is alert and oriented to person, place, and time.  Skin: Skin is warm and dry.  Psychiatric: He has a normal mood and affect.   Filed Vitals:   01/04/15 1027  BP: 132/74  Pulse: 59  Temp: 98.6 F (37 C)  TempSrc: Oral  Resp: 16  Height: 6' (1.829 m)  Weight: 168 lb 6.4 oz (76.386 kg)  SpO2: 97%      Assessment  & Plan:  Flu and prevnar 13 shot given at visit.

## 2015-01-04 NOTE — Progress Notes (Signed)
Pre visit review using our clinic review tool, if applicable. No additional management support is needed unless otherwise documented below in the visit note. 

## 2015-01-04 NOTE — Patient Instructions (Signed)
We have given you the pneumonia and the flu shot today.   We are not changing your medicines but are checking blood work today.  Keep up the good work on your health!   We are keeping Mark Clayton in our thoughts and wish her an easy time with the medicines.

## 2015-01-04 NOTE — Assessment & Plan Note (Signed)
Checking CBC today and adjust as needed. Not taking iron pills currently.

## 2015-01-04 NOTE — Assessment & Plan Note (Signed)
Currently taking pravachol 20 mg daily. No lipid panel in some time so checking today. Per his cardiologist they want to aggressively manage his risk factors and are considering addition of praulent.

## 2015-01-04 NOTE — Assessment & Plan Note (Signed)
Up to date on colon cancer screening, next due 2019.

## 2015-01-04 NOTE — Assessment & Plan Note (Signed)
Has ICD pacer in place. Last EF in chart is 20% however with pacing may have improved. Taking ARB, statin, beta blocker, aspirin and plavix.

## 2015-02-01 ENCOUNTER — Encounter: Payer: Self-pay | Admitting: Internal Medicine

## 2015-02-01 ENCOUNTER — Ambulatory Visit (INDEPENDENT_AMBULATORY_CARE_PROVIDER_SITE_OTHER): Payer: Medicare HMO | Admitting: Internal Medicine

## 2015-02-01 VITALS — BP 146/82 | HR 66 | Ht 72.0 in | Wt 171.6 lb

## 2015-02-01 DIAGNOSIS — I2510 Atherosclerotic heart disease of native coronary artery without angina pectoris: Secondary | ICD-10-CM | POA: Diagnosis not present

## 2015-02-01 DIAGNOSIS — Z4502 Encounter for adjustment and management of automatic implantable cardiac defibrillator: Secondary | ICD-10-CM | POA: Diagnosis not present

## 2015-02-01 DIAGNOSIS — I255 Ischemic cardiomyopathy: Secondary | ICD-10-CM | POA: Diagnosis not present

## 2015-02-01 DIAGNOSIS — I5022 Chronic systolic (congestive) heart failure: Secondary | ICD-10-CM

## 2015-02-01 DIAGNOSIS — Z9861 Coronary angioplasty status: Secondary | ICD-10-CM

## 2015-02-01 LAB — CUP PACEART INCLINIC DEVICE CHECK
Battery Voltage: 3.01 V
Brady Statistic RV Percent Paced: 0.46 %
Date Time Interrogation Session: 20161104124213
HighPow Impedance: 41 Ohm
HighPow Impedance: 56 Ohm
Implantable Lead Implant Date: 20041020
Implantable Lead Implant Date: 20100913
Implantable Lead Location: 753860
Implantable Lead Location: 753860
Implantable Lead Model: 5076
Implantable Lead Model: 6949
Lead Channel Impedance Value: 494 Ohm
Lead Channel Pacing Threshold Amplitude: 1 V
Lead Channel Pacing Threshold Pulse Width: 0.8 ms
Lead Channel Sensing Intrinsic Amplitude: 31.625 mV
Lead Channel Sensing Intrinsic Amplitude: 31.625 mV
Lead Channel Setting Pacing Amplitude: 2.5 V
Lead Channel Setting Pacing Pulse Width: 0.8 ms
Lead Channel Setting Sensing Sensitivity: 0.45 mV

## 2015-02-01 NOTE — Progress Notes (Signed)
Electrophysiology Office Note   Date:  02/01/2015   ID:  Emmert, Roethler [birth year], MRN [id number]  PCP:  [name], MD  Cardiologist:   Primary Electrophysiologist:  Virl Axe, MD    No chief complaint on file.    History of Present Illness:  Mark Clayton is a 74 y.o. male is       Seen in followup for an ICD implanted for primary prevention. He had a 6949-lead which was replaced with a right ventricular pace sense lead at the time of generator replacement. Stress testing January 2012 demonstrated mild periinfarct ischemia. EF 30%   Cath 09/20/14 and was found to have native three-vessel disease including a 90% RCA, but his LIMA-LAD and SVG-OM2-OM3 were patent. However, the SVG-RPDA-RPLB was occluded between the RPDA and the RPLB. The native RCA had a 90% stenosis. Dr. Beau Fanny treated the native RCA with a drug-eluting stent reducing the stenosis to 0. The SVG-RPDA portion of the graft is still patent  He has been much improved post stenting; lss sob and no cp   His wife has been dx with liposarcoma enveloping her aorta and kidney  Currently undergoing XRT  Echo 10/14 EF 20% there was also RV dysfunction and significant LA enlargement (53/2.7)  He was around the time of his catheterization noted to be anemic and underwent an evaluation for the possibility of iron deficiency anemia   stool guaiacs were negative.  Currently being followed by primary care    Past Medical History  Diagnosis Date  . Ischemic cardiomyopathy     a. EF 20% in 2014. (Master study EF >20%)  . Carotid bruit   . Hyperlipidemia   . CAD (coronary artery disease) 2003    a. h/o MI and CABG in 2003. b. s/p DES to SVG-RPDA-RPLB in 08/2014.  Marland Kitchen HTN (hypertension)   . GERD (gastroesophageal reflux disease) 02-06-11  . Anemia 02-06-11    takes oral iron  . Chronic systolic CHF (congestive heart failure) (HCC)     a. EF 20% in 2014.  Marland Kitchen Cough   . AICD (automatic  cardioverter/defibrillator) present 01/17/2003    Medtronic Maximo 7232CX ICD, serial [id number]  . Myocardial infarct ([place])     2003  . Arthritis     hands, knees  . Cancer of sigmoid colon ([place]) 2012    a. s/p colon surgery.  . LV (left ventricular) mural thrombus ([place])     a. Last echo 12/2012 EF 20% with mural thrombus, mod-severe left atrial enlargement. Per Dr. Olin Pia notes from that time, echo in 2009 demonstrated something similar - he had been on warfarin following prior MI and this was stopped after a number of months. Given lack of data regarding anticoagulation from chronic clot, he has not been on anticoagulation since.   Past Surgical History  Procedure Laterality Date  . Cardiac defibrillator placement  01/17/03    6949 lead. Medtronic. remote-no; with later revision  . Cataract extraction w/ intraocular lens  implant, bilateral Bilateral June/-July 2009    Dr. Katy Fitch  . Colon resection  02/09/2011    Procedure: LAPAROSCOPIC SIGMOID COLON RESECTION;  Surgeon: Pedro Earls, MD;  Location: [facility];  Service: General;  Laterality: N/A;  Laparoscopic Assisted Sigmoid Colectomy  . Colonoscopy  08/31/2011    Procedure: COLONOSCOPY;  Surgeon: Jerene Bears, MD;  Location: [facility];  Service: Gastroenterology;  Laterality: N/A;  . Colonoscopy N/A 09/05/2012    Procedure: COLONOSCOPY;  Surgeon: Ulice Dash  Everitt Amber, MD;  Location: [facility];  Service: Gastroenterology;  Laterality: N/A;  . Laparoscopic right hemi colectomy N/A 11/04/2012    Procedure: LAPAROSCOPIC RIGHT HEMI COLECTOMY;  Surgeon: Pedro Earls, MD;  Location: [facility];  Service: General;  Laterality: N/A;  . Colonoscopy N/A 04/18/2013    Procedure: COLONOSCOPY;  Surgeon: Jerene Bears, MD;  Location: [facility];  Service: Gastroenterology;  Laterality: N/A;  . Colonoscopy N/A 04/09/2014    Procedure: COLONOSCOPY;  Surgeon: Jerene Bears, MD;  Location: [facility];  Service: Gastroenterology;  Laterality: N/A;  .  Coronary artery bypass graft  01/2002    LIMA-LAD, SVG-OM 2-OM 3, SVG-RPDA-RPLB  . Colon surgery    . Tonsillectomy  ~ 1950  . Inguinal hernia repair Right 2000's X 2  . Icd generator change  2010    Medtronic Virtuoso II VR ICD  . Cardiac catheterization N/A 09/20/2014    Procedure: Left Heart Cath and Coronary Angiography;  Surgeon: Jettie Booze, MD; LAD 95%, D1 100%, CFX liner percent, OM 200%, OM 390%, RCA 90%, LIMA-LAD okay, SVG-OM 2-OM 3 minimal disease, SVG-RPDA-RPLB 100% between the RPDA and RPL     . Cardiac catheterization N/A 09/20/2014    Procedure: Coronary Stent Intervention;  Surgeon: Jettie Booze, MD; Synergy DES 4 x 24 mm reducing the stenosis to 5%   . Coronary angioplasty       Current Outpatient Prescriptions  Medication Sig Dispense Refill  . acetaminophen (TYLENOL) 500 MG tablet Take 500-1,000 mg by mouth daily as needed for moderate pain or headache.    Marland Kitchen aspirin 81 MG tablet Take 81 mg by mouth daily. At night.    . carvedilol (COREG) 25 MG tablet Take 25 mg by mouth at bedtime.     . clopidogrel (PLAVIX) 75 MG tablet Take 1 tablet (75 mg total) by mouth daily with breakfast. 90 tablet 3  . furosemide (LASIX) 40 MG tablet Take 1 tablet (40 mg total) by mouth daily. 90 tablet 3  . losartan (COZAAR) 50 MG tablet Take 1 tablet (50 mg total) by mouth daily. 90 tablet 3  . meclizine (ANTIVERT) 25 MG tablet Take 25 mg by mouth daily as needed for dizziness.     . nitroGLYCERIN (NITROSTAT) 0.4 MG SL tablet Place 1 tablet (0.4 mg total) under the tongue every 5 (five) minutes as needed for chest pain. Chest pain 25 tablet 1  . pantoprazole (PROTONIX) 40 MG tablet Take 1 tablet (40 mg total) by mouth daily. 90 tablet 3  . pravastatin (PRAVACHOL) 20 MG tablet Take 20 mg by mouth at bedtime.     Marland Kitchen PRESCRIPTION MEDICATION 1 each every 5 (five) weeks. Eye Injection every 5 weeks.     No current facility-administered medications for this visit.    Allergies:    Latex and Tape   Social History:  The patient  reports that he has never smoked. He has never used smokeless tobacco. He reports that he drinks about 4.2 oz of alcohol per week. He reports that he does not use illicit drugs.   Family History:  The patient's family history includes Alzheimer's disease in his mother; Heart disease in his father; Hyperlipidemia in his father and sister; Hypertension in his father and sister; Prostate cancer in his father. There is no history of Colon cancer, Esophageal cancer, Rectal cancer, or Stomach cancer.    ROS:  Please see the history of present illness and past medical history  Otherwise, all other  systems were reviewed and were negative .     PHYSICAL EXAM: VS:  There were no vitals taken for this visit. , BMI There is no weight on file to calculate BMI. GEN: Well nourished, well developed, in no acute distress HEENT: normal Neck:  JVD flat , carotid bruits, or masses Cardiac: REGULAR RATE and RHYTHM ;  No murmurs, rubs, No S4  Back without kyphosis; No CVAT Respiratory:  clear to auscultation bilaterally, normal work of breathing GI: soft, nontender, nondistended, + BS MS: no deformity or atrophy Extremities no clubbing cyanosis  edema Skin: warm and dry,   device pocket is well healed without teathering Neuro:  Strength and sensation are intact Psych: euthymic mood, full affect  EKG:  EKG is ordered today. The ekg ordered today shows sinus rhythm at 66  Interval 17/15/44    [place] nnonspecific IVCD    Device interrogation is reviewed today in detail.  See PaceArt for details.   Recent Labs: 01/04/2015: ALT 12; BUN 15; Creatinine, Ser 1.20; Hemoglobin 11.8*; Platelets 177.0; Potassium 4.6; Sodium 143    Lipid Panel     Component Value Date/Time   CHOL 185 01/04/2015 1104   TRIG 117.0 01/04/2015 1104   HDL 52.70 01/04/2015 1104   CHOLHDL 4 01/04/2015 1104   VLDL 23.4 01/04/2015 1104   LDLCALC 109* 01/04/2015 1104      Wt Readings from Last 3 Encounters:  01/04/15 168 lb 6.4 oz (76.386 kg)  10/05/14 167 lb 1.9 oz (75.805 kg)  09/21/14 175 lb 11.3 oz (79.7 kg)      Other studies Reviewed: Additional studies/ records that were reviewed today include:  Myoview scan 2012 demonstrated large scar without ischemia EF was 29%      ASSESSMENT AND PLAN:  Ischemic cardio myopathy  Implantable defibrillator-Medtronic-single chamber  The patient's device was interrogated.  The information was reviewed. No changes were made in the programming.     Hypertension   Bradycardia  Congestive heart failure-chronic-systolic       Much improved Without symptoms of ischemia  Euvolemic continue current meds  Continue current meds  We will  dsicss alternative statins at next visit   Current medicines are reviewed at length with the patient today.   The patient does not have concerns regarding his medicines.  The following changes were made today:  none  Labs/ tests ordered today include: As above  No orders of the defined types were placed in this encounter.     Disposition:   FU with me  In 012 m Signed, Virl Axe, MD  02/01/2015 8:33 AM     [place] [address] [place] [address] [phone] (office) [phone] (fax)

## 2015-02-01 NOTE — Patient Instructions (Signed)
Medication Instructions: - no changes  Labwork: - none  Procedures/Testing: - none  Follow-Up: - Remote monitoring is used to monitor your Pacemaker of ICD from home. This monitoring reduces the number of office visits required to check your device to one time per year. It allows [place] to keep an eye on the functioning of your device to ensure it is working properly. You are scheduled for a device check from home on 05/06/15. You may send your transmission at any time that day. If you have a wireless device, the transmission will be sent automatically. After your physician reviews your transmission, you will receive a postcard with your next transmission date.  - Your physician wants you to follow-up in: 1 year with Dr. Caryl Comes. You will receive a reminder letter in the mail two months in advance. If you don't receive a letter, please call our office to schedule the follow-up appointment.  Any Additional Special Instructions Will Be Listed Below (If Applicable).

## 2015-03-18 ENCOUNTER — Telehealth: Payer: Self-pay | Admitting: Internal Medicine

## 2015-03-18 ENCOUNTER — Other Ambulatory Visit: Payer: Self-pay | Admitting: Geriatric Medicine

## 2015-03-18 MED ORDER — PRAVASTATIN SODIUM 20 MG PO TABS: 20.0000 mg | ORAL_TABLET | Freq: Every day | ORAL | Status: DC

## 2015-03-18 NOTE — Telephone Encounter (Signed)
12.19.16 Pt is requesting refill of pravastatin. Please call pt with questions or concerns. MS

## 2015-05-06 ENCOUNTER — Ambulatory Visit (INDEPENDENT_AMBULATORY_CARE_PROVIDER_SITE_OTHER): Payer: Medicare HMO | Admitting: *Deleted

## 2015-05-06 DIAGNOSIS — I255 Ischemic cardiomyopathy: Secondary | ICD-10-CM

## 2015-05-07 NOTE — Progress Notes (Signed)
Remote ICD transmission.   

## 2015-05-20 ENCOUNTER — Telehealth: Payer: Self-pay | Admitting: Internal Medicine

## 2015-05-20 NOTE — Telephone Encounter (Signed)
Patient would like a call back in reference to recent tightness in chest that is has been experiencing. Taken 10 nitro in the past 3 weeks.  Mostly experiences this when he is outside doing things. Not currently experiencing any symptoms

## 2015-05-21 NOTE — Telephone Encounter (Signed)
Pt said he called yesterday,complaining of chest pains. Pt says he still have not heard anything.Please call today if possible.

## 2015-05-21 NOTE — Telephone Encounter (Signed)
I spoke with the patient- I apologized I did not call him back yesterday as I did not get his message (sent to [place], NT). The patient reports that for the last 3- 4 weeks, he has had chest discomfort with exertion. He has a history of stenting and states symptoms feel similar to what he was having prior to previous stenting. He has taken about 10 NTG over the last 3-4 weeks. He does report relief of symptoms with NTG. I advised him he should be seen for follow up of symptoms. I have scheduled him to see Ellen Henri, PA on 2/23 at 2:00 pm as Dr. Caryl Comes will be in the office that afternoon. He is agreeable.

## 2015-05-23 ENCOUNTER — Ambulatory Visit (INDEPENDENT_AMBULATORY_CARE_PROVIDER_SITE_OTHER): Payer: Medicare HMO | Admitting: Cardiology

## 2015-05-23 ENCOUNTER — Encounter: Payer: Self-pay | Admitting: Cardiology

## 2015-05-23 VITALS — BP 128/68 | HR 62 | Ht 72.0 in | Wt 175.8 lb

## 2015-05-23 DIAGNOSIS — I10 Essential (primary) hypertension: Secondary | ICD-10-CM

## 2015-05-23 DIAGNOSIS — I5022 Chronic systolic (congestive) heart failure: Secondary | ICD-10-CM | POA: Diagnosis not present

## 2015-05-23 DIAGNOSIS — R079 Chest pain, unspecified: Secondary | ICD-10-CM | POA: Diagnosis not present

## 2015-05-23 DIAGNOSIS — I255 Ischemic cardiomyopathy: Secondary | ICD-10-CM | POA: Diagnosis not present

## 2015-05-23 MED ORDER — ISOSORBIDE MONONITRATE ER 30 MG PO TB24: 30.0000 mg | ORAL_TABLET | Freq: Every day | ORAL | Status: DC

## 2015-05-23 NOTE — Patient Instructions (Signed)
Medication Instructions:  Your physician has recommended you make the following change in your medication:  1.  START Imdur 30 mg taking 1 daily  Labwork: None ordered  Testing/Procedures: None ordered  Follow-Up: Your physician recommends that you schedule a follow-up appointment in: 1-2 [place], PA-C   Any Other Special Instructions Will Be Listed Below (If Applicable).     If you need a refill on your cardiac medications before your next appointment, please call your pharmacy.

## 2015-05-23 NOTE — Progress Notes (Signed)
05/23/2015 Mark Clayton   06-09-1940  PC:[id number]  Primary Physician [name], MD Primary Cardiologist/Electrophysiologist: Dr. Caryl Comes  Reason for Visit/CC: Exertional CP  HPI:  75 y/o male with a h/o ischemic cardiomyopathy, CAD s/p CABG, chronic systolic CHF with EF of [id number], s/p ICD implant for primary prevention, followed by Dr. Caryl Comes. His most recent cath 09/20/14 showed  native three-vessel disease including a 90% RCA, but his LIMA-LAD and SVG-OM2-OM3 were patent. However, the SVG-RPDA-RPLB was occluded between the RPDA and the RPLB. The native RCA had a 90% stenosis. Dr. Beau Fanny treated the native RCA with a drug-eluting stent reducing the stenosis to 0. The SVG-RPDA portion of the graft is still patent.  He presents to clinic with a complaint of exertional chest pain and dyspnea. He denies any resting symptoms. Symptoms resolve with SL NTG and rest. He reports full medication compliance with ASA, Plavix, statin, BB and ARB. He does not smoke. No h/o DM.   EKG shows diffuse TWIs/ LVH. There is new TWI in lead V4, compared to prior. He is currently CP free. His wife just underwent major surgery for cancer. He has been caring for her and has been under a great deal of stress.    Current Outpatient Prescriptions  Medication Sig Dispense Refill  . acetaminophen (TYLENOL) 500 MG tablet Take 500-1,000 mg by mouth daily as needed for moderate pain or headache.    Marland Kitchen aspirin 81 MG tablet Take 81 mg by mouth daily. At night.    . carvedilol (COREG) 25 MG tablet Take 25 mg by mouth at bedtime.     . clopidogrel (PLAVIX) 75 MG tablet Take 1 tablet (75 mg total) by mouth daily with breakfast. 90 tablet 3  . furosemide (LASIX) 40 MG tablet Take 1 tablet (40 mg total) by mouth daily. 90 tablet 3  . losartan (COZAAR) 50 MG tablet Take 1 tablet (50 mg total) by mouth daily. 90 tablet 3  . meclizine (ANTIVERT) 25 MG tablet Take 25 mg by mouth daily as needed for dizziness.     .  nitroGLYCERIN (NITROSTAT) 0.4 MG SL tablet Place 0.4 mg under the tongue every 5 (five) minutes as needed for chest pain (x 3 doses).    . pantoprazole (PROTONIX) 40 MG tablet Take 1 tablet (40 mg total) by mouth daily. 90 tablet 3  . pravastatin (PRAVACHOL) 20 MG tablet Take 1 tablet (20 mg total) by mouth at bedtime. 90 tablet 3  . PRESCRIPTION MEDICATION 1 each every 5 (five) weeks. Eye Injection every 5 weeks.    . isosorbide mononitrate (IMDUR) 30 MG 24 hr tablet Take 1 tablet (30 mg total) by mouth daily. 90 tablet 3   No current facility-administered medications for this visit.    Allergies  Allergen Reactions  . Latex Rash  . Tape Rash    USE PAPER    Social History   Social History  . Marital Status: Married    Spouse Name: N/A  . Number of Children: 3  . Years of Education: N/A   Occupational History  . self employed    Social History Main Topics  . Smoking status: Never Smoker   . Smokeless tobacco: Never Used  . Alcohol Use: 4.2 oz/week    7 Cans of beer per week  . Drug Use: No  . Sexual Activity: Yes   Other Topics Concern  . Not on file   Social History Narrative   Full time. Married.  Review of Systems: General: negative for chills, fever, night sweats or weight changes.  Cardiovascular: negative for chest pain, dyspnea on exertion, edema, orthopnea, palpitations, paroxysmal nocturnal dyspnea or shortness of breath Dermatological: negative for rash Respiratory: negative for cough or wheezing Urologic: negative for hematuria Abdominal: negative for nausea, vomiting, diarrhea, bright red blood per rectum, melena, or hematemesis Neurologic: negative for visual changes, syncope, or dizziness All other systems reviewed and are otherwise negative except as noted above.    Blood pressure 128/68, pulse 62, height 6' (1.829 m), weight 175 lb 12.8 oz (79.742 kg).  General appearance: alert, cooperative and no distress Neck: no carotid bruit and no  JVD Lungs: clear to auscultation bilaterally Heart: regular rate and rhythm, S1, S2 normal, no murmur, click, rub or gallop Extremities: no LEE Pulses: 2+ and symmetric Skin: warm and dry Neurologic: Grossly normal  EKG NSR. LVH no change prior prior.   ASSESSMENT AND PLAN:   1. CAD/Exertional Angina: patient with known CAD s/p prior CABG. His most recent cath 09/20/14 showed  native three-vessel disease including a 90% RCA, but his LIMA-LAD and SVG-OM2-OM3 were patent. However, the SVG-RPDA-RPLB was occluded between the RPDA and the RPLB. The native RCA had a 90% stenosis. He is s/p PCI + DES. He reports full daily compliance with all of his meds including DAPT. His EKG today shows no major changes compared to prior EKGs. He is asymptomatic at rest. We discussed undergoing repeat LHC to assess RCA stent patency and to also redefine his coronary anatomy, however given to his wife's current condition, he wishes to avoid any procedures/ hospitalizations at this time if possible. We will attempt to optimize his medical therapy for now, by adding Imdur, 30 mg daily. He is to continue ASA, Plavix, Coreg, losartan and pravastatin. We will reassess his response to medical therapy in 1-2 weeks. If persistent symptoms, we will revisit the idea of [facility]. He is in agreement with this plan. He was informed to seek emergent medical attention if severe CP not relieved with SL NTG.   2. Chronic Systolic CHF/ Ischemic Cardiomyopathy: euvolemic on physical exam. Continue lasix, BB and ARB.  3. ICD: for primary prevention given ICM. Followed by Dr. Caryl Comes. He denies any shocks.    PLAN  Add Imdur 30 mg.  F/u in 1-2 weeks. Cath if no improvement.   Lyda Jester PA-C 05/23/2015 2:55 PM

## 2015-05-25 ENCOUNTER — Encounter: Payer: Self-pay | Admitting: Internal Medicine

## 2015-05-25 ENCOUNTER — Ambulatory Visit (INDEPENDENT_AMBULATORY_CARE_PROVIDER_SITE_OTHER): Payer: Medicare HMO | Admitting: Internal Medicine

## 2015-05-25 VITALS — BP 120/60 | HR 70 | Temp 97.8°F | Resp 18 | Ht 72.0 in | Wt 174.2 lb

## 2015-05-25 DIAGNOSIS — J069 Acute upper respiratory infection, unspecified: Secondary | ICD-10-CM

## 2015-05-25 DIAGNOSIS — B9789 Other viral agents as the cause of diseases classified elsewhere: Secondary | ICD-10-CM

## 2015-05-25 DIAGNOSIS — E785 Hyperlipidemia, unspecified: Secondary | ICD-10-CM | POA: Diagnosis not present

## 2015-05-25 DIAGNOSIS — I255 Ischemic cardiomyopathy: Secondary | ICD-10-CM | POA: Diagnosis not present

## 2015-05-25 NOTE — Progress Notes (Signed)
Subjective:    Patient ID: Mark Clayton, male    DOB: Jan 03, 1941, 75 y.o.   MRN: [id number]  HPI   75 year old patient who has a history of coronary artery disease ischemic cardiomyopathy and mild aortic stenosis.  He is followed by cardiology.  yesterday he developed cough chills and episodes of diaphoresis. Today he feels improved but slightly weak. Denies any productive cough at this time. He has had a recent cardiology evaluation  Past Medical History  Diagnosis Date  . Ischemic cardiomyopathy     a. EF 20% in 2014. (Master study EF >20%)  . Carotid bruit   . Hyperlipidemia   . CAD (coronary artery disease) 2003    a. h/o MI and CABG in 2003. b. s/p DES to SVG-RPDA-RPLB in 08/2014.  Marland Kitchen HTN (hypertension)   . GERD (gastroesophageal reflux disease) 02-06-11  . Anemia 02-06-11    takes oral iron  . Chronic systolic CHF (congestive heart failure) (HCC)     a. EF 20% in 2014.  Marland Kitchen Cough   . AICD (automatic cardioverter/defibrillator) present 01/17/2003    Medtronic Maximo 7232CX ICD, serial [id number]  . Myocardial infarct ([place])     2003  . Arthritis     hands, knees  . Cancer of sigmoid colon ([place]) 2012    a. s/p colon surgery.  . LV (left ventricular) mural thrombus ([place])     a. Last echo 12/2012 EF 20% with mural thrombus, mod-severe left atrial enlargement. Per Dr. Olin Pia notes from that time, echo in 2009 demonstrated something similar - he had been on warfarin following prior MI and this was stopped after a number of months. Given lack of data regarding anticoagulation from chronic clot, he has not been on anticoagulation since.    Social History   Social History  . Marital Status: Married    Spouse Name: N/A  . Number of Children: 3  . Years of Education: N/A   Occupational History  . self employed    Social History Main Topics  . Smoking status: Never Smoker   . Smokeless tobacco: Never Used  . Alcohol Use: 4.2 oz/week    7 Cans of beer per week  . Drug Use:  No  . Sexual Activity: Yes   Other Topics Concern  . Not on file   Social History Narrative   Full time. Married.     Past Surgical History  Procedure Laterality Date  . Cardiac defibrillator placement  01/17/03    6949 lead. Medtronic. remote-no; with later revision  . Cataract extraction w/ intraocular lens  implant, bilateral Bilateral June/-July 2009    Dr. Katy Fitch  . Colon resection  02/09/2011    Procedure: LAPAROSCOPIC SIGMOID COLON RESECTION;  Surgeon: Pedro Earls, MD;  Location: [facility];  Service: General;  Laterality: N/A;  Laparoscopic Assisted Sigmoid Colectomy  . Colonoscopy  08/31/2011    Procedure: COLONOSCOPY;  Surgeon: Jerene Bears, MD;  Location: [facility];  Service: Gastroenterology;  Laterality: N/A;  . Colonoscopy N/A 09/05/2012    Procedure: COLONOSCOPY;  Surgeon: Jerene Bears, MD;  Location: [facility];  Service: Gastroenterology;  Laterality: N/A;  . Laparoscopic right hemi colectomy N/A 11/04/2012    Procedure: LAPAROSCOPIC RIGHT HEMI COLECTOMY;  Surgeon: Pedro Earls, MD;  Location: [facility];  Service: General;  Laterality: N/A;  . Colonoscopy N/A 04/18/2013    Procedure: COLONOSCOPY;  Surgeon: Jerene Bears, MD;  Location: [facility];  Service: Gastroenterology;  Laterality: N/A;  .  Colonoscopy N/A 04/09/2014    Procedure: COLONOSCOPY;  Surgeon: Jerene Bears, MD;  Location: [facility];  Service: Gastroenterology;  Laterality: N/A;  . Coronary artery bypass graft  01/2002    LIMA-LAD, SVG-OM 2-OM 3, SVG-RPDA-RPLB  . Colon surgery    . Tonsillectomy  ~ 1950  . Inguinal hernia repair Right 2000's X 2  . Icd generator change  2010    Medtronic Virtuoso II VR ICD  . Cardiac catheterization N/A 09/20/2014    Procedure: Left Heart Cath and Coronary Angiography;  Surgeon: Jettie Booze, MD; LAD 95%, D1 100%, CFX liner percent, OM 200%, OM 390%, RCA 90%, LIMA-LAD okay, SVG-OM 2-OM 3 minimal disease, SVG-RPDA-RPLB 100% between the RPDA and RPL     .  Cardiac catheterization N/A 09/20/2014    Procedure: Coronary Stent Intervention;  Surgeon: Jettie Booze, MD; Synergy DES 4 x 24 mm reducing the stenosis to 5%   . Coronary angioplasty      Family History  Problem Relation Age of Onset  . Hypertension Father   . Hyperlipidemia Father   . Heart disease Father   . Prostate cancer Father   . Hypertension Sister   . Hyperlipidemia Sister   . Alzheimer's disease Mother   . Colon cancer Neg Hx   . Esophageal cancer Neg Hx   . Rectal cancer Neg Hx   . Stomach cancer Neg Hx     Allergies  Allergen Reactions  . Latex Rash  . Tape Rash    USE PAPER    Current Outpatient Prescriptions on File Prior to Visit  Medication Sig Dispense Refill  . acetaminophen (TYLENOL) 500 MG tablet Take 500-1,000 mg by mouth daily as needed for moderate pain or headache.    Marland Kitchen aspirin 81 MG tablet Take 81 mg by mouth daily. At night.    . carvedilol (COREG) 25 MG tablet Take 25 mg by mouth at bedtime.     . clopidogrel (PLAVIX) 75 MG tablet Take 1 tablet (75 mg total) by mouth daily with breakfast. 90 tablet 3  . furosemide (LASIX) 40 MG tablet Take 1 tablet (40 mg total) by mouth daily. 90 tablet 3  . isosorbide mononitrate (IMDUR) 30 MG 24 hr tablet Take 1 tablet (30 mg total) by mouth daily. 90 tablet 3  . losartan (COZAAR) 50 MG tablet Take 1 tablet (50 mg total) by mouth daily. 90 tablet 3  . meclizine (ANTIVERT) 25 MG tablet Take 25 mg by mouth daily as needed for dizziness.     . nitroGLYCERIN (NITROSTAT) 0.4 MG SL tablet Place 0.4 mg under the tongue every 5 (five) minutes as needed for chest pain (x 3 doses).    . pantoprazole (PROTONIX) 40 MG tablet Take 1 tablet (40 mg total) by mouth daily. 90 tablet 3  . pravastatin (PRAVACHOL) 20 MG tablet Take 1 tablet (20 mg total) by mouth at bedtime. 90 tablet 3  . PRESCRIPTION MEDICATION 1 each every 5 (five) weeks. Eye Injection every 5 weeks.     No current facility-administered medications on  file prior to visit.    BP 120/60 mmHg  Pulse 70  Temp(Src) 97.8 F (36.6 C) (Oral)  Resp 18  Ht 6' (1.829 m)  Wt 174 lb 4 oz (79.039 kg)  BMI 23.63 kg/m2  SpO2 98%     Review of Systems  Constitutional: Positive for chills, diaphoresis, activity change and appetite change. Negative for fever and fatigue.  HENT: Negative for congestion, dental problem,  ear pain, hearing loss, sore throat, tinnitus, trouble swallowing and voice change.   Eyes: Negative for pain, discharge and visual disturbance.  Respiratory: Positive for cough. Negative for chest tightness, wheezing and stridor.   Cardiovascular: Negative for chest pain, palpitations and leg swelling.  Gastrointestinal: Negative for nausea, vomiting, abdominal pain, diarrhea, constipation, blood in stool and abdominal distention.  Genitourinary: Negative for urgency, hematuria, flank pain, discharge, difficulty urinating and genital sores.  Musculoskeletal: Negative for myalgias, back pain, joint swelling, arthralgias, gait problem and neck stiffness.  Skin: Negative for rash.  Neurological: Negative for dizziness, syncope, speech difficulty, weakness, numbness and headaches.  Hematological: Negative for adenopathy. Does not bruise/bleed easily.  Psychiatric/Behavioral: Negative for behavioral problems and dysphoric mood. The patient is not nervous/anxious.        Objective:   Physical Exam  Constitutional: He is oriented to person, place, and time. He appears well-developed.  Clinically, appears well No distress Afebrile O2 saturation 98%  HENT:  Head: Normocephalic.  Right Ear: External ear normal.  Left Ear: External ear normal.  Eyes: Conjunctivae and EOM are normal.  Neck: Normal range of motion.  Cardiovascular: Normal rate, regular rhythm and normal heart sounds.   Pulse 70  Pulmonary/Chest: Effort normal and breath sounds normal. No respiratory distress. He has no wheezes. He has no rales.  Abdominal: Bowel  sounds are normal.  Musculoskeletal: Normal range of motion. He exhibits no edema or tenderness.  Neurological: He is alert and oriented to person, place, and time.  Psychiatric: He has a normal mood and affect. His behavior is normal.          Assessment & Plan:   Viral URI with cough.  .  Will treat symptomatically coronary  artery disease Ischemic cardiomyopathy

## 2015-05-25 NOTE — Progress Notes (Signed)
Pre-visit discussion using our clinic review tool. No additional management support is needed unless otherwise documented below in the visit note.  

## 2015-05-25 NOTE — Patient Instructions (Signed)
Acute bronchitis symptoms for less than 10 days are generally not helped by antibiotics.  Take over-the-counter expectorants and cough medications such as  Mucinex DM.  Call if there is no improvement in 5 to 7 days or if  you develop worsening cough, fever, or new symptoms, such as shortness of breath or chest pain.  Drink as much fluid as you  can tolerate over the next few days  Call or return to clinic prn if these symptoms worsen or fail to improve as anticipated.

## 2015-05-26 LAB — CUP PACEART REMOTE DEVICE CHECK
Battery Voltage: 3.01 V
Brady Statistic RV Percent Paced: 0.42 %
Date Time Interrogation Session: 20170206072609
HighPow Impedance: 42 Ohm
HighPow Impedance: 55 Ohm
Implantable Lead Implant Date: 20041020
Implantable Lead Implant Date: 20100913
Implantable Lead Location: 753860
Implantable Lead Location: 753860
Implantable Lead Model: 5076
Implantable Lead Model: 6949
Lead Channel Impedance Value: 494 Ohm
Lead Channel Sensing Intrinsic Amplitude: 31.625 mV
Lead Channel Sensing Intrinsic Amplitude: 31.625 mV
Lead Channel Setting Pacing Amplitude: 2.5 V
Lead Channel Setting Pacing Pulse Width: 0.8 ms
Lead Channel Setting Sensing Sensitivity: 0.45 mV

## 2015-05-29 ENCOUNTER — Encounter: Payer: Self-pay | Admitting: Cardiology

## 2015-07-15 ENCOUNTER — Other Ambulatory Visit: Payer: Self-pay | Admitting: Internal Medicine

## 2015-08-05 ENCOUNTER — Ambulatory Visit (INDEPENDENT_AMBULATORY_CARE_PROVIDER_SITE_OTHER): Payer: Commercial Managed Care - HMO | Admitting: *Deleted

## 2015-08-05 DIAGNOSIS — I255 Ischemic cardiomyopathy: Secondary | ICD-10-CM

## 2015-08-05 DIAGNOSIS — I5022 Chronic systolic (congestive) heart failure: Secondary | ICD-10-CM | POA: Diagnosis not present

## 2015-08-05 NOTE — Progress Notes (Signed)
Remote ICD transmission.   

## 2015-09-06 ENCOUNTER — Other Ambulatory Visit: Payer: Self-pay | Admitting: Physician Assistant

## 2015-09-06 ENCOUNTER — Other Ambulatory Visit: Payer: Self-pay | Admitting: Internal Medicine

## 2015-09-11 ENCOUNTER — Encounter: Payer: Self-pay | Admitting: Cardiology

## 2015-09-12 LAB — CUP PACEART REMOTE DEVICE CHECK
Battery Voltage: 2.99 V
Battery Voltage: 2.99 V
Brady Statistic RV Percent Paced: 0.5 %
Brady Statistic RV Percent Paced: 0.64 %
Date Time Interrogation Session: 20170508052508
Date Time Interrogation Session: 20170508144302
HighPow Impedance: 42 Ohm
HighPow Impedance: 44 Ohm
HighPow Impedance: 55 Ohm
HighPow Impedance: 57 Ohm
Implantable Lead Implant Date: 20041020
Implantable Lead Implant Date: 20041020
Implantable Lead Implant Date: 20100913
Implantable Lead Implant Date: 20100913
Implantable Lead Location: 753860
Implantable Lead Location: 753860
Implantable Lead Location: 753860
Implantable Lead Location: 753860
Implantable Lead Model: 5076
Implantable Lead Model: 5076
Implantable Lead Model: 6949
Implantable Lead Model: 6949
Lead Channel Impedance Value: 475 Ohm
Lead Channel Impedance Value: 494 Ohm
Lead Channel Sensing Intrinsic Amplitude: 31.625 mV
Lead Channel Sensing Intrinsic Amplitude: 31.625 mV
Lead Channel Sensing Intrinsic Amplitude: 31.625 mV
Lead Channel Sensing Intrinsic Amplitude: 31.625 mV
Lead Channel Setting Pacing Amplitude: 2.5 V
Lead Channel Setting Pacing Amplitude: 2.5 V
Lead Channel Setting Pacing Pulse Width: 0.8 ms
Lead Channel Setting Pacing Pulse Width: 0.8 ms
Lead Channel Setting Sensing Sensitivity: 0.45 mV
Lead Channel Setting Sensing Sensitivity: 0.45 mV

## 2015-10-15 ENCOUNTER — Telehealth: Payer: Self-pay | Admitting: Internal Medicine

## 2015-10-15 NOTE — Addendum Note (Signed)
Addended by: Alvis Lemmings C on: 10/15/2015 06:52 PM   Modules accepted: Orders, Medications

## 2015-10-15 NOTE — Telephone Encounter (Signed)
New Message  Pt c/o medication issue:  1. Name of Medication: Plavix  2. How are you currently taking this medication (dosage and times per day)? 75mg   3. Are you having a reaction (difficulty breathing--STAT)? No   4. What is your medication issue? Pt states he was to take med for a year. Pt states he is done with Medication and would like to speak with RN on further instruction. Please call back to disucss

## 2015-10-15 NOTE — Telephone Encounter (Signed)
I called and spoke with the patient.  He is aware of Dr. Olin Pia recommendation to stop plavix. He voices understanding.

## 2015-10-15 NOTE — Telephone Encounter (Signed)
It would be reasonable at this time to stop taking his Plavix.

## 2015-10-15 NOTE — Telephone Encounter (Signed)
Last cath done 09/20/14:  Conclusion     Severe native three-vessel coronary artery disease. Patent LIMA to LAD. Patent jump graft SVG to OM 2 and OM 3. Patent SVG to PDA. The second portion of this graft which goes to the posterior lateral artery is occluded.  90% distal RCA stenosis which supplies the posterior lateral territory. Successful 4.0 x 24 Synergy drug-eluting stent placement to the distal right coronary artery, postdilated to 5 mm in diameter..  Continue dual antiplatelets therapy for at least a year. Continue aggressive secondary prevention. He'll be watched overnight. Possible discharge tomorrow.    Will forward to Dr. Caryl Comes to review.

## 2015-11-04 ENCOUNTER — Ambulatory Visit (INDEPENDENT_AMBULATORY_CARE_PROVIDER_SITE_OTHER): Payer: Commercial Managed Care - HMO | Admitting: *Deleted

## 2015-11-04 DIAGNOSIS — I255 Ischemic cardiomyopathy: Secondary | ICD-10-CM | POA: Diagnosis not present

## 2015-11-04 NOTE — Progress Notes (Signed)
Remote ICD transmission.   

## 2015-11-06 ENCOUNTER — Encounter: Payer: Self-pay | Admitting: Cardiology

## 2015-11-07 LAB — CUP PACEART REMOTE DEVICE CHECK
Battery Voltage: 2.98 V
Brady Statistic RV Percent Paced: 0.31 %
Date Time Interrogation Session: 20170807083827
HighPow Impedance: 46 Ohm
HighPow Impedance: 57 Ohm
Implantable Lead Implant Date: 20041020
Implantable Lead Implant Date: 20100913
Implantable Lead Location: 753860
Implantable Lead Location: 753860
Implantable Lead Model: 5076
Implantable Lead Model: 6949
Lead Channel Impedance Value: 494 Ohm
Lead Channel Sensing Intrinsic Amplitude: 31.625 mV
Lead Channel Sensing Intrinsic Amplitude: 31.625 mV
Lead Channel Setting Pacing Amplitude: 2.5 V
Lead Channel Setting Pacing Pulse Width: 0.8 ms
Lead Channel Setting Sensing Sensitivity: 0.45 mV

## 2016-01-06 ENCOUNTER — Encounter: Payer: Self-pay | Admitting: Internal Medicine

## 2016-01-06 ENCOUNTER — Encounter: Payer: Medicare HMO | Admitting: Internal Medicine

## 2016-01-06 ENCOUNTER — Encounter: Payer: Commercial Managed Care - HMO | Admitting: Internal Medicine

## 2016-01-10 ENCOUNTER — Encounter: Payer: Self-pay | Admitting: Internal Medicine

## 2016-01-10 ENCOUNTER — Ambulatory Visit (INDEPENDENT_AMBULATORY_CARE_PROVIDER_SITE_OTHER): Payer: Commercial Managed Care - HMO | Admitting: Internal Medicine

## 2016-01-10 ENCOUNTER — Other Ambulatory Visit (INDEPENDENT_AMBULATORY_CARE_PROVIDER_SITE_OTHER): Payer: Commercial Managed Care - HMO

## 2016-01-10 VITALS — BP 120/62 | HR 55 | Temp 98.1°F | Resp 14 | Ht 72.0 in | Wt 170.0 lb

## 2016-01-10 DIAGNOSIS — Z0000 Encounter for general adult medical examination without abnormal findings: Secondary | ICD-10-CM

## 2016-01-10 DIAGNOSIS — I5022 Chronic systolic (congestive) heart failure: Secondary | ICD-10-CM | POA: Diagnosis not present

## 2016-01-10 DIAGNOSIS — Z7189 Other specified counseling: Secondary | ICD-10-CM | POA: Insufficient documentation

## 2016-01-10 DIAGNOSIS — Z23 Encounter for immunization: Secondary | ICD-10-CM | POA: Diagnosis not present

## 2016-01-10 DIAGNOSIS — E785 Hyperlipidemia, unspecified: Secondary | ICD-10-CM

## 2016-01-10 LAB — COMPREHENSIVE METABOLIC PANEL
ALT: 9 U/L (ref 0–53)
AST: 15 U/L (ref 0–37)
Albumin: 4.1 g/dL (ref 3.5–5.2)
Alkaline Phosphatase: 45 U/L (ref 39–117)
BUN: 13 mg/dL (ref 6–23)
CO2: 31 mEq/L (ref 19–32)
Calcium: 9.2 mg/dL (ref 8.4–10.5)
Chloride: 105 mEq/L (ref 96–112)
Creatinine, Ser: 1.08 mg/dL (ref 0.40–1.50)
GFR: 70.83 mL/min (ref >60.00)
Glucose, Bld: 108 mg/dL — ABNORMAL HIGH (ref 70–99)
Potassium: 4.1 mEq/L (ref 3.5–5.1)
Sodium: 140 mEq/L (ref 135–145)
Total Bilirubin: 0.8 mg/dL (ref 0.2–1.2)
Total Protein: 6.5 g/dL (ref 6.0–8.3)

## 2016-01-10 LAB — LIPID PANEL
Cholesterol: 177 mg/dL (ref 0–200)
HDL: 53.1 mg/dL (ref >39.00)
LDL Cholesterol: 107 mg/dL — ABNORMAL HIGH (ref 0–99)
NonHDL: 124.29
Total CHOL/HDL Ratio: 3
Triglycerides: 88 mg/dL (ref 0.0–149.0)
VLDL: 17.6 mg/dL (ref 0.0–40.0)

## 2016-01-10 LAB — CBC
HCT: 34.4 % — ABNORMAL LOW (ref 39.0–52.0)
Hemoglobin: 11.4 g/dL — ABNORMAL LOW (ref 13.0–17.0)
MCHC: 33 g/dL (ref 30.0–36.0)
MCV: 80.4 fl (ref 78.0–100.0)
Platelets: 179 10*3/uL (ref 150.0–400.0)
RBC: 4.28 Mil/uL (ref 4.22–5.81)
RDW: 16.4 % — ABNORMAL HIGH (ref 11.5–15.5)
WBC: 4.7 10*3/uL (ref 4.0–10.5)

## 2016-01-10 MED ORDER — NITROGLYCERIN 0.4 MG SL SUBL: 0.4000 mg | SUBLINGUAL_TABLET | SUBLINGUAL | 0 refills | Status: DC | PRN

## 2016-01-10 NOTE — Progress Notes (Signed)
Pre visit review using our clinic review tool, if applicable. No additional management support is needed unless otherwise documented below in the visit note. 

## 2016-01-10 NOTE — Assessment & Plan Note (Signed)
Not controlled and his cardiologist is managing (considering psk therapy). Taking pravastatin daily and has not tolerated other statins or higher doses. Checking lipid panel.

## 2016-01-10 NOTE — Assessment & Plan Note (Signed)
Checking labs, given flu shot today. Pneumonia series completed and shingles done. Tetanus up to date. Counseled on sun safety and mole surveillance. Given 10 year screening recommendations. Due colonoscopy in January and he will get done.

## 2016-01-10 NOTE — Assessment & Plan Note (Signed)
No flare today, is on lasix, coreg, losartan, aspirin, pravastatin.

## 2016-01-10 NOTE — Addendum Note (Signed)
Addended by: Resa Miner R on: 01/10/2016 09:30 AM   Modules accepted: Orders

## 2016-01-10 NOTE — Progress Notes (Signed)
   Subjective:    Patient ID: Mark Clayton, male    DOB: Apr 23, 1940, 75 y.o.   MRN: [id number]  HPI Here for medicare wellness and CPE, no new complaints. Please see A/P for status and treatment of chronic medical problems.   Diet: heart healthy  Physical activity: walks 2 miles per day Depression/mood screen: negative Hearing: moderate loss, needs hearing aid and will pursue in January Visual acuity: grossly normal right eye, left with limited vision, performs annual eye exam Dr Zadie Rhine ADLs: capable Fall risk: none Home safety: good Cognitive evaluation: intact to orientation, naming, recall and repetition EOL planning: adv directives discussed  I have personally reviewed and have noted 1. The patient's medical and social history - reviewed today no changes 2. Their use of alcohol, tobacco or illicit drugs 3. Their current medications and supplements 4. The patient's functional ability including ADL's, fall risks, home safety risks and hearing or visual impairment. 5. Diet and physical activities 6. Evidence for depression or mood disorders 7. Care team reviewed and updated (available in snapshot)  Review of Systems  Constitutional: Negative for activity change, appetite change, fatigue, fever and unexpected weight change.  HENT: Negative.   Eyes: Negative.   Respiratory: Negative for cough, chest tightness, shortness of breath and wheezing.   Cardiovascular: Positive for chest pain. Negative for palpitations and leg swelling.       Rare  Gastrointestinal: Negative.   Musculoskeletal: Negative.   Skin: Negative.   Neurological: Negative.   Psychiatric/Behavioral: Negative.       Objective:   Physical Exam  Constitutional: He is oriented to person, place, and time. He appears well-developed and well-nourished.  HENT:  Head: Normocephalic and atraumatic.  Eyes: EOM are normal.  Neck: Normal range of motion.  Cardiovascular: Normal rate and regular rhythm.     Pulmonary/Chest: Effort normal and breath sounds normal. No respiratory distress. He has no wheezes. He has no rales.  Abdominal: Soft. Bowel sounds are normal. He exhibits no distension. There is no tenderness. There is no rebound and no guarding.  Musculoskeletal: He exhibits no edema.  Neurological: He is alert and oriented to person, place, and time.  Skin: Skin is warm and dry.  Psychiatric: He has a normal mood and affect.   Vitals:   01/10/16 0818  BP: 120/62  Pulse: (!) 55  Resp: 14  Temp: 98.1 F (36.7 C)  TempSrc: Oral  SpO2: 98%  Weight: 170 lb (77.1 kg)  Height: 6' (1.829 m)      Assessment & Plan:  Flu shot given at visit.

## 2016-01-10 NOTE — Patient Instructions (Signed)
We are checking the blood work today and given you the flu shot.   Health Maintenance, Male Adopting a healthy lifestyle and getting preventive care can go a long way to promote health and wellness. Talk with your health care provider about what schedule of regular examinations is right for you. This is a good chance for you to check in with your provider about disease prevention and staying healthy. In between checkups, there are plenty of things you can do on your own. Experts have done a lot of research about which lifestyle changes and preventive measures are most likely to keep you healthy. Ask your health care provider for more information. WEIGHT AND DIET  Eat a healthy diet  Be sure to include plenty of vegetables, fruits, low-fat dairy products, and lean protein.  Do not eat a lot of foods high in solid fats, added sugars, or salt.  Get regular exercise. This is one of the most important things you can do for your health.  Most adults should exercise for at least 150 minutes each week. The exercise should increase your heart rate and make you sweat (moderate-intensity exercise).  Most adults should also do strengthening exercises at least twice a week. This is in addition to the moderate-intensity exercise.  Maintain a healthy weight  Body mass index (BMI) is a measurement that can be used to identify possible weight problems. It estimates body fat based on height and weight. Your health care provider can help determine your BMI and help you achieve or maintain a healthy weight.  For females 32 years of age and older:   A BMI below 18.5 is considered underweight.  A BMI of 18.5 to 24.9 is normal.  A BMI of 25 to 29.9 is considered overweight.  A BMI of 30 and above is considered obese.  Watch levels of cholesterol and blood lipids  You should start having your blood tested for lipids and cholesterol at 75 years of age, then have this test every 5 years.  You may need  to have your cholesterol levels checked more often if:  Your lipid or cholesterol levels are high.  You are older than 75 years of age.  You are at high risk for heart disease.  CANCER SCREENING   Lung Cancer  Lung cancer screening is recommended for adults 39-20 years old who are at high risk for lung cancer because of a history of smoking.  A yearly low-dose CT scan of the lungs is recommended for people who:  Currently smoke.  Have quit within the past 15 years.  Have at least a 30-pack-year history of smoking. A pack year is smoking an average of one pack of cigarettes a day for 1 year.  Yearly screening should continue until it has been 15 years since you quit.  Yearly screening should stop if you develop a health problem that would prevent you from having lung cancer treatment.  Breast Cancer  Practice breast self-awareness. This means understanding how your breasts normally appear and feel.  It also means doing regular breast self-exams. Let your health care provider know about any changes, no matter how small.  If you are in your 20s or 30s, you should have a clinical breast exam (CBE) by a health care provider every 1-3 years as part of a regular health exam.  If you are 61 or older, have a CBE every year. Also consider having a breast X-ray (mammogram) every year.  If you have a family history  of breast cancer, talk to your health care provider about genetic screening.  If you are at high risk for breast cancer, talk to your health care provider about having an MRI and a mammogram every year.  Breast cancer gene (BRCA) assessment is recommended for women who have family members with BRCA-related cancers. BRCA-related cancers include:  Breast.  Ovarian.  Tubal.  Peritoneal cancers.  Results of the assessment will determine the need for genetic counseling and BRCA1 and BRCA2 testing. Cervical Cancer Your health care provider may recommend that you be  screened regularly for cancer of the pelvic organs (ovaries, uterus, and vagina). This screening involves a pelvic examination, including checking for microscopic changes to the surface of your cervix (Pap test). You may be encouraged to have this screening done every 3 years, beginning at age 39.  For women ages 39-65, health care providers may recommend pelvic exams and Pap testing every 3 years, or they may recommend the Pap and pelvic exam, combined with testing for human papilloma virus (HPV), every 5 years. Some types of HPV increase your risk of cervical cancer. Testing for HPV may also be done on women of any age with unclear Pap test results.  Other health care providers may not recommend any screening for nonpregnant women who are considered low risk for pelvic cancer and who do not have symptoms. Ask your health care provider if a screening pelvic exam is right for you.  If you have had past treatment for cervical cancer or a condition that could lead to cancer, you need Pap tests and screening for cancer for at least 20 years after your treatment. If Pap tests have been discontinued, your risk factors (such as having a new sexual partner) need to be reassessed to determine if screening should resume. Some women have medical problems that increase the chance of getting cervical cancer. In these cases, your health care provider may recommend more frequent screening and Pap tests. Colorectal Cancer  This type of cancer can be detected and often prevented.  Routine colorectal cancer screening usually begins at 75 years of age and continues through 75 years of age.  Your health care provider may recommend screening at an earlier age if you have risk factors for colon cancer.  Your health care provider may also recommend using home test kits to check for hidden blood in the stool.  A small camera at the end of a tube can be used to examine your colon directly (sigmoidoscopy or colonoscopy).  This is done to check for the earliest forms of colorectal cancer.  Routine screening usually begins at age 70.  Direct examination of the colon should be repeated every 5-10 years through 75 years of age. However, you may need to be screened more often if early forms of precancerous polyps or small growths are found. Skin Cancer  Check your skin from head to toe regularly.  Tell your health care provider about any new moles or changes in moles, especially if there is a change in a mole's shape or color.  Also tell your health care provider if you have a mole that is larger than the size of a pencil eraser.  Always use sunscreen. Apply sunscreen liberally and repeatedly throughout the day.  Protect yourself by wearing long sleeves, pants, a wide-brimmed hat, and sunglasses whenever you are outside. HEART DISEASE, DIABETES, AND HIGH BLOOD PRESSURE   High blood pressure causes heart disease and increases the risk of stroke. High blood pressure is  more likely to develop in:  People who have blood pressure in the high end of the normal range (130-139/85-89 mm Hg).  People who are overweight or obese.  People who are African American.  If you are 11-85 years of age, have your blood pressure checked every 3-5 years. If you are 84 years of age or older, have your blood pressure checked every year. You should have your blood pressure measured twice--once when you are at a hospital or clinic, and once when you are not at a hospital or clinic. Record the average of the two measurements. To check your blood pressure when you are not at a hospital or clinic, you can use:  An automated blood pressure machine at a pharmacy.  A home blood pressure monitor.  If you are between 23 years and 12 years old, ask your health care provider if you should take aspirin to prevent strokes.  Have regular diabetes screenings. This involves taking a blood sample to check your fasting blood sugar level.  If you  are at a normal weight and have a low risk for diabetes, have this test once every three years after 75 years of age.  If you are overweight and have a high risk for diabetes, consider being tested at a younger age or more often. PREVENTING INFECTION  Hepatitis B  If you have a higher risk for hepatitis B, you should be screened for this virus. You are considered at high risk for hepatitis B if:  You were born in a country where hepatitis B is common. Ask your health care provider which countries are considered high risk.  Your parents were born in a high-risk country, and you have not been immunized against hepatitis B (hepatitis B vaccine).  You have HIV or AIDS.  You use needles to inject street drugs.  You live with someone who has hepatitis B.  You have had sex with someone who has hepatitis B.  You get hemodialysis treatment.  You take certain medicines for conditions, including cancer, organ transplantation, and autoimmune conditions. Hepatitis C  Blood testing is recommended for:  Everyone born from [birth year] through 1965.  Anyone with known risk factors for hepatitis C. Sexually transmitted infections (STIs)  You should be screened for sexually transmitted infections (STIs) including gonorrhea and chlamydia if:  You are sexually active and are younger than 75 years of age.  You are older than 75 years of age and your health care provider tells you that you are at risk for this type of infection.  Your sexual activity has changed since you were last screened and you are at an increased risk for chlamydia or gonorrhea. Ask your health care provider if you are at risk.  If you do not have HIV, but are at risk, it may be recommended that you take a prescription medicine daily to prevent HIV infection. This is called pre-exposure prophylaxis (PrEP). You are considered at risk if:  You are sexually active and do not regularly use condoms or know the HIV status of your  partner(s).  You take drugs by injection.  You are sexually active with a partner who has HIV. Talk with your health care provider about whether you are at high risk of being infected with HIV. If you choose to begin PrEP, you should first be tested for HIV. You should then be tested every 3 months for as long as you are taking PrEP.  PREGNANCY   If you are premenopausal and you  may become pregnant, ask your health care provider about preconception counseling.  If you may become pregnant, take 400 to 800 micrograms (mcg) of folic acid every day.  If you want to prevent pregnancy, talk to your health care provider about birth control (contraception). OSTEOPOROSIS AND MENOPAUSE   Osteoporosis is a disease in which the bones lose minerals and strength with aging. This can result in serious bone fractures. Your risk for osteoporosis can be identified using a bone density scan.  If you are 75 years of age or older, or if you are at risk for osteoporosis and fractures, ask your health care provider if you should be screened.  Ask your health care provider whether you should take a calcium or vitamin D supplement to lower your risk for osteoporosis.  Menopause may have certain physical symptoms and risks.  Hormone replacement therapy may reduce some of these symptoms and risks. Talk to your health care provider about whether hormone replacement therapy is right for you.  HOME CARE INSTRUCTIONS   Schedule regular health, dental, and eye exams.  Stay current with your immunizations.   Do not use any tobacco products including cigarettes, chewing tobacco, or electronic cigarettes.  If you are pregnant, do not drink alcohol.  If you are breastfeeding, limit how much and how often you drink alcohol.  Limit alcohol intake to no more than 1 drink per day for nonpregnant women. One drink equals 12 ounces of beer, 5 ounces of wine, or 1 ounces of hard liquor.  Do not use street drugs.  Do  not share needles.  Ask your health care provider for help if you need support or information about quitting drugs.  Tell your health care provider if you often feel depressed.  Tell your health care provider if you have ever been abused or do not feel safe at home.   This information is not intended to replace advice given to you by your health care provider. Make sure you discuss any questions you have with your health care provider.   Document Released: 09/29/2010 Document Revised: 04/06/2014 Document Reviewed: 02/15/2013 Elsevier Interactive Patient Education [employer].

## 2016-01-13 ENCOUNTER — Telehealth: Payer: Self-pay | Admitting: *Deleted

## 2016-01-13 MED ORDER — NITROGLYCERIN 0.4 MG SL SUBL: 0.4000 mg | SUBLINGUAL_TABLET | SUBLINGUAL | 0 refills | Status: DC | PRN

## 2016-01-13 NOTE — Telephone Encounter (Signed)
Rec'd fax stating need to clarify quantity for the Nitroglycerin 0.4SL tabs. Plans allows 90 day supply which is a bottle 12f 100. Rx that was sent on 10/3 was quantity #5. Resent 90 day to [facility]...Johny Chess

## 2016-01-16 ENCOUNTER — Other Ambulatory Visit: Payer: Self-pay | Admitting: Internal Medicine

## 2016-01-22 MED ORDER — NITROGLYCERIN 0.4 MG SL SUBL: 0.4000 mg | SUBLINGUAL_TABLET | SUBLINGUAL | 0 refills | Status: DC | PRN

## 2016-01-22 NOTE — Telephone Encounter (Signed)
Per Jonelle Sidle MD don't want pt to have # 100 pills need to cancel order w/[facility] if they have not processed order. Called [facility] spoke w/rep Rachel Bo verified if rx from 10/16 has been processed. Per rep patient cancel order on 10/17, and was not shipped out. I called pt to verify if he was needing medication. He stated he have about 3 pills, and he doesn't use often, and that's why he cancel order w/[employer]. He stated he still need rx for nitroglycerin but needing rx sent to his local pharmacy [facility] instead...Mark Clayton

## 2016-01-22 NOTE — Telephone Encounter (Signed)
Sent in #5 to local pharmacy.

## 2016-01-22 NOTE — Addendum Note (Signed)
Addended by: Pricilla Holm A on: 01/22/2016 09:08 AM   Modules accepted: Orders

## 2016-03-12 ENCOUNTER — Encounter: Payer: Commercial Managed Care - HMO | Admitting: Internal Medicine

## 2016-03-17 ENCOUNTER — Other Ambulatory Visit: Payer: Self-pay | Admitting: *Deleted

## 2016-03-17 MED ORDER — LOSARTAN POTASSIUM 50 MG PO TABS: 50.0000 mg | ORAL_TABLET | Freq: Every day | ORAL | 0 refills | Status: DC

## 2016-03-17 MED ORDER — CARVEDILOL 25 MG PO TABS: 25.0000 mg | ORAL_TABLET | Freq: Every day | ORAL | 0 refills | Status: DC

## 2016-04-04 ENCOUNTER — Ambulatory Visit (INDEPENDENT_AMBULATORY_CARE_PROVIDER_SITE_OTHER): Payer: Medicare Other | Admitting: Internal Medicine

## 2016-04-04 ENCOUNTER — Encounter: Payer: Self-pay | Admitting: Internal Medicine

## 2016-04-04 DIAGNOSIS — Z95810 Presence of automatic (implantable) cardiac defibrillator: Secondary | ICD-10-CM | POA: Diagnosis not present

## 2016-04-04 DIAGNOSIS — I255 Ischemic cardiomyopathy: Secondary | ICD-10-CM | POA: Diagnosis not present

## 2016-04-04 DIAGNOSIS — I5022 Chronic systolic (congestive) heart failure: Secondary | ICD-10-CM | POA: Diagnosis not present

## 2016-04-04 DIAGNOSIS — J209 Acute bronchitis, unspecified: Secondary | ICD-10-CM | POA: Insufficient documentation

## 2016-04-04 MED ORDER — HYDROCODONE-HOMATROPINE 5-1.5 MG/5ML PO SYRP: 5.0000 mL | ORAL_SOLUTION | Freq: Three times a day (TID) | ORAL | 0 refills | Status: DC | PRN

## 2016-04-04 MED ORDER — PREDNISONE 20 MG PO TABS: 40.0000 mg | ORAL_TABLET | Freq: Every day | ORAL | 0 refills | Status: DC

## 2016-04-04 MED ORDER — PRAVASTATIN SODIUM 20 MG PO TABS: 20.0000 mg | ORAL_TABLET | Freq: Every day | ORAL | 3 refills | Status: DC

## 2016-04-04 MED ORDER — PANTOPRAZOLE SODIUM 40 MG PO TBEC: 40.0000 mg | DELAYED_RELEASE_TABLET | Freq: Every day | ORAL | 3 refills | Status: DC

## 2016-04-04 MED ORDER — MECLIZINE HCL 25 MG PO TABS
25.0000 mg | ORAL_TABLET | Freq: Every day | ORAL | 1 refills | Status: DC | PRN
Start: 2016-04-04 — End: 2017-04-02

## 2016-04-04 NOTE — Progress Notes (Signed)
Pre visit review using our clinic review tool, if applicable. No additional management support is needed unless otherwise documented below in the visit note. 

## 2016-04-04 NOTE — Patient Instructions (Addendum)
We have sent in the prednisone to help with the lungs. Take 2 pills a day for 5 days then stop.   If you are not feeling better in 2-3 days call us back.  We have given you the cough syrup today that you can use.

## 2016-04-04 NOTE — Progress Notes (Signed)
   Subjective:    Patient ID: Mark Clayton, male    DOB: 08-25-1940, 76 y.o.   MRN: [id number]  HPI The patient is a 76 YO man coming in for cough for 3-4 days. Started with some nose congestion and drainage as well. Now more in his chest. Some SOB as well with this. No fevers or chills. No known sick contacts. Weight is stable at home (CHF) without extra swelling or change in diet. No extra salt. His oxygen level is slightly lower than normal in the office today. No wheezing. Now some mild nose drainage still. Has tried some over the counter cold medication which has been somewhat helpful. Coughing is keeping him up at night time.   Review of Systems  Constitutional: Positive for activity change and chills. Negative for appetite change, fatigue, fever and unexpected weight change.  HENT: Positive for congestion and rhinorrhea. Negative for ear discharge, ear pain, postnasal drip, sinus pain, sinus pressure, sore throat and trouble swallowing.   Eyes: Negative.   Respiratory: Positive for cough, shortness of breath and wheezing. Negative for chest tightness.   Cardiovascular: Negative.   Gastrointestinal: Negative.   Musculoskeletal: Negative.       Objective:   Physical Exam  Constitutional: He is oriented to person, place, and time.  HENT:  Head: Normocephalic and atraumatic.  TMs normal bilaterally, oropharynx with redness and clear drainage, nose without crusting, no sinus pain or pressure.   Eyes: EOM are normal.  Cardiovascular: Normal rate and regular rhythm.   Pulmonary/Chest: Effort normal. No respiratory distress. He has wheezes. He has no rales.  Mild wheezing which does not clear with coughing.   Abdominal: Soft.  Musculoskeletal: He exhibits no edema.  Lymphadenopathy:    He has no cervical adenopathy.  Neurological: He is alert and oriented to person, place, and time.  Skin: Skin is warm and dry.   Vitals:   04/04/16 1011  BP: 140/62  Pulse: (!) 57  Temp: 97.7 F  (36.5 C)  TempSrc: Oral  SpO2: 90%  Weight: 173 lb 6.4 oz (78.7 kg)      Assessment & Plan:

## 2016-04-04 NOTE — Assessment & Plan Note (Signed)
Rx for prednisone due to the wheezing on exam today. No indication for antibiotics as the etiology is likely viral. Also given rx for hycodan cough syrup for cough.

## 2016-04-04 NOTE — Assessment & Plan Note (Signed)
No flare today, weight is the same at home on scale. Will continue to monitor weight and swelling and if change will call the office.

## 2016-04-06 ENCOUNTER — Telehealth: Payer: Self-pay

## 2016-04-06 DIAGNOSIS — H35362 Drusen (degenerative) of macula, left eye: Secondary | ICD-10-CM | POA: Diagnosis not present

## 2016-04-06 DIAGNOSIS — H3562 Retinal hemorrhage, left eye: Secondary | ICD-10-CM | POA: Diagnosis not present

## 2016-04-06 DIAGNOSIS — H35361 Drusen (degenerative) of macula, right eye: Secondary | ICD-10-CM | POA: Diagnosis not present

## 2016-04-06 DIAGNOSIS — H353221 Exudative age-related macular degeneration, left eye, with active choroidal neovascularization: Secondary | ICD-10-CM | POA: Diagnosis not present

## 2016-04-06 MED ORDER — NITROGLYCERIN 0.4 MG SL SUBL: 0.4000 mg | SUBLINGUAL_TABLET | SUBLINGUAL | 0 refills | Status: DC | PRN

## 2016-04-06 MED ORDER — PRAVASTATIN SODIUM 20 MG PO TABS
20.0000 mg | ORAL_TABLET | Freq: Every day | ORAL | 3 refills | Status: DC
Start: 2016-04-06 — End: 2017-01-15

## 2016-04-06 NOTE — Telephone Encounter (Signed)
Sent in nitroglycerin and Pravstatin per [facility] request

## 2016-04-07 ENCOUNTER — Other Ambulatory Visit: Payer: Self-pay | Admitting: *Deleted

## 2016-04-07 ENCOUNTER — Encounter: Payer: Commercial Managed Care - HMO | Admitting: Internal Medicine

## 2016-04-07 MED ORDER — ISOSORBIDE MONONITRATE ER 30 MG PO TB24: 30.0000 mg | ORAL_TABLET | Freq: Every day | ORAL | 0 refills | Status: DC

## 2016-04-08 ENCOUNTER — Telehealth: Payer: Self-pay

## 2016-04-08 MED ORDER — LOSARTAN POTASSIUM 50 MG PO TABS: 50.0000 mg | ORAL_TABLET | Freq: Every day | ORAL | 1 refills | Status: DC

## 2016-04-08 MED ORDER — FUROSEMIDE 40 MG PO TABS: 40.0000 mg | ORAL_TABLET | Freq: Every day | ORAL | 1 refills | Status: DC

## 2016-04-08 MED ORDER — CARVEDILOL 25 MG PO TABS
25.0000 mg | ORAL_TABLET | Freq: Every day | ORAL | 1 refills | Status: DC
Start: 2016-04-08 — End: 2016-08-12

## 2016-04-08 NOTE — Telephone Encounter (Signed)
[facility] sent in refill request for furosemide, losartan, and carvedilol.  Refills sent in

## 2016-04-15 ENCOUNTER — Encounter: Payer: Commercial Managed Care - HMO | Admitting: Internal Medicine

## 2016-04-20 DIAGNOSIS — H52223 Regular astigmatism, bilateral: Secondary | ICD-10-CM | POA: Diagnosis not present

## 2016-04-20 DIAGNOSIS — H5203 Hypermetropia, bilateral: Secondary | ICD-10-CM | POA: Diagnosis not present

## 2016-04-20 DIAGNOSIS — H353221 Exudative age-related macular degeneration, left eye, with active choroidal neovascularization: Secondary | ICD-10-CM | POA: Diagnosis not present

## 2016-04-20 DIAGNOSIS — H353114 Nonexudative age-related macular degeneration, right eye, advanced atrophic with subfoveal involvement: Secondary | ICD-10-CM | POA: Diagnosis not present

## 2016-04-26 NOTE — Progress Notes (Signed)
Electrophysiology Office Note   Date:  04/27/2016   ID:  Mark Clayton, Mark Clayton 1940/08/17, MRN [id number]  PCP:  [name], MD  Cardiologist:   Primary Electrophysiologist:  Virl Axe, MD    No chief complaint on file.    History of Present Illness:  Mark Clayton is a 76 y.o. male is       Seen in followup for an ICD implanted for primary prevention. He had a 6949-lead which was replaced with a right ventricular pace sense lead at the time of generator replacement. Stress testing January 2012 demonstrated mild periinfarct ischemia. EF 30%   Cath 09/20/14 and was found to have native three-vessel disease including a 90% RCA, but his LIMA-LAD and SVG-OM2-OM3 were patent. However, the SVG-RPDA-RPLB was occluded between the RPDA and the RPLB. The native RCA had a 90% stenosis. Dr. Beau Fanny treated the native RCA with a drug-eluting stent reducing the stenosis to 0. The SVG-RPDA portion of the graft is still patent  He has been much improved post stenting;   His wife has been dx with liposarcoma enveloping her aorta and kidney  Currently undergoing XRT His wife died in 09-14-2015  He complaining of SOB and dizziness with some palpitations  DATE TEST     /14    Echo   EF 20 %   6/16    Myoview   EF   %   6/16 Cath EF 20%  Severe native three-vessel coronary artery disease. Patent LIMA to LAD. Patent jump graft SVG to OM 2 and OM 3. Patent SVG to PDA. The second portion of this graft which goes to the posterior lateral artery is occluded. >90% distal RCA stenosis  Successful 4.0 x 24 Synergy drug-eluting stent placement         Past Medical History:  Diagnosis Date  . AICD (automatic cardioverter/defibrillator) present 01/17/2003   Medtronic Maximo 7232CX ICD, serial [id number]  . Anemia 02-06-11   takes oral iron  . Arthritis    hands, knees  . CAD (coronary artery disease) 2003   a. h/o MI and CABG in 2003. b. s/p DES to SVG-RPDA-RPLB in 08/2014.  Marland Kitchen Cancer of  sigmoid colon ([place]) 2012   a. s/p colon surgery.  . Carotid bruit   . Chronic systolic CHF (congestive heart failure) (HCC)    a. EF 20% in 2014.  Marland Kitchen Cough   . GERD (gastroesophageal reflux disease) 02-06-11  . HTN (hypertension)   . Hyperlipidemia   . Ischemic cardiomyopathy    a. EF 20% in 2014. (Master study EF >20%)  . LV (left ventricular) mural thrombus    a. Last echo 12/2012 EF 20% with mural thrombus, mod-severe left atrial enlargement. Per Dr. Olin Pia notes from that time, echo in 2009 demonstrated something similar - he had been on warfarin following prior MI and this was stopped after a number of months. Given lack of data regarding anticoagulation from chronic clot, he has not been on anticoagulation since.  . Myocardial infarct    2003   Past Surgical History:  Procedure Laterality Date  . CARDIAC CATHETERIZATION N/A 09/20/2014   Procedure: Left Heart Cath and Coronary Angiography;  Surgeon: Jettie Booze, MD; LAD 95%, D1 100%, CFX liner percent, OM 200%, OM 390%, RCA 90%, LIMA-LAD okay, SVG-OM 2-OM 3 minimal disease, SVG-RPDA-RPLB 100% between the RPDA and RPL     . CARDIAC CATHETERIZATION N/A 09/20/2014   Procedure: Coronary Stent Intervention;  Surgeon: Charlann Lange  Irish Lack, MD; Synergy DES 4 x 24 mm reducing the stenosis to 5%   . CARDIAC DEFIBRILLATOR PLACEMENT  01/17/03   6949 lead. Medtronic. remote-no; with later revision  . CATARACT EXTRACTION W/ INTRAOCULAR LENS  IMPLANT, BILATERAL Bilateral June/-July 2009   Dr. Katy Fitch  . COLON RESECTION  02/09/2011   Procedure: LAPAROSCOPIC SIGMOID COLON RESECTION;  Surgeon: Pedro Earls, MD;  Location: [facility];  Service: General;  Laterality: N/A;  Laparoscopic Assisted Sigmoid Colectomy  . COLON SURGERY    . COLONOSCOPY  08/31/2011   Procedure: COLONOSCOPY;  Surgeon: Jerene Bears, MD;  Location: [facility];  Service: Gastroenterology;  Laterality: N/A;  . COLONOSCOPY N/A 09/05/2012   Procedure: COLONOSCOPY;  Surgeon: Jerene Bears, MD;  Location: [facility];  Service: Gastroenterology;  Laterality: N/A;  . COLONOSCOPY N/A 04/18/2013   Procedure: COLONOSCOPY;  Surgeon: Jerene Bears, MD;  Location: [facility];  Service: Gastroenterology;  Laterality: N/A;  . COLONOSCOPY N/A 04/09/2014   Procedure: COLONOSCOPY;  Surgeon: Jerene Bears, MD;  Location: [facility];  Service: Gastroenterology;  Laterality: N/A;  . CORONARY ANGIOPLASTY    . CORONARY ARTERY BYPASS GRAFT  01/2002   LIMA-LAD, SVG-OM 2-OM 3, SVG-RPDA-RPLB  . ICD GENERATOR CHANGE  2010   Medtronic Virtuoso II VR ICD  . INGUINAL HERNIA REPAIR Right 2000's X 2  . LAPAROSCOPIC RIGHT HEMI COLECTOMY N/A 11/04/2012   Procedure: LAPAROSCOPIC RIGHT HEMI COLECTOMY;  Surgeon: Pedro Earls, MD;  Location: [facility];  Service: General;  Laterality: N/A;  . TONSILLECTOMY  ~ 1950     Current Outpatient Prescriptions  Medication Sig Dispense Refill  . acetaminophen (TYLENOL) 500 MG tablet Take 500-1,000 mg by mouth daily as needed for moderate pain or headache.    Marland Kitchen aspirin 81 MG tablet Take 81 mg by mouth daily. At night.    . carvedilol (COREG) 25 MG tablet Take 1 tablet (25 mg total) by mouth at bedtime. 90 tablet 1  . furosemide (LASIX) 40 MG tablet Take 1 tablet (40 mg total) by mouth daily. 90 tablet 1  . HYDROcodone-homatropine (HYCODAN) 5-1.5 MG/5ML syrup Take 5 mLs by mouth every 8 (eight) hours as needed for cough. 120 mL 0  . isosorbide mononitrate (IMDUR) 30 MG 24 hr tablet Take 1 tablet (30 mg total) by mouth daily. 90 tablet 0  . losartan (COZAAR) 50 MG tablet Take 1 tablet (50 mg total) by mouth daily. 90 tablet 1  . meclizine (ANTIVERT) 25 MG tablet Take 1 tablet (25 mg total) by mouth daily as needed for dizziness. 30 tablet 1  . nitroGLYCERIN (NITROSTAT) 0.4 MG SL tablet Place 1 tablet (0.4 mg total) under the tongue every 5 (five) minutes as needed for chest pain (x 3 doses). 5 tablet 0  . pantoprazole (PROTONIX) 40 MG tablet Take 1 tablet (40 mg  total) by mouth daily. 90 tablet 3  . pravastatin (PRAVACHOL) 20 MG tablet Take 1 tablet (20 mg total) by mouth at bedtime. 90 tablet 3  . predniSONE (DELTASONE) 20 MG tablet Take 2 tablets (40 mg total) by mouth daily with breakfast. 10 tablet 0  . PRESCRIPTION MEDICATION 1 each every 5 (five) weeks. Eye Injection every 5 weeks.     No current facility-administered medications for this visit.     Allergies:   Latex and Tape   Social History:  The patient  reports that he has never smoked. He has never used smokeless tobacco. He reports that he drinks about  4.2 oz of alcohol per week . He reports that he does not use drugs.   Family History:  The patient's family history includes Alzheimer's disease in his mother; Heart disease in his father; Hyperlipidemia in his father and sister; Hypertension in his father and sister; Prostate cancer in his father.    ROS:  Please see the history of present illness and past medical history  Otherwise, all other systems were reviewed and were negative .     PHYSICAL EXAM: VS:  BP 120/70   Pulse 87   Ht 6' (1.829 m)   Wt 163 lb 3.2 oz (74 kg)   SpO2 97%   BMI 22.13 kg/m  , BMI Body mass index is 22.13 kg/m. GEN: Well nourished, well developed, in no acute distress  HEENT: normal  Neck:  JVD flat , carotid bruits, or masses Cardiac: IRREGULAR RATE and RHYTHM ;  No murmurs, rubs, No S4  Back without kyphosis; No CVAT Respiratory:  clear to auscultation bilaterally, normal work of breathing GI: soft, nontender, nondistended, + BS MS: no deformity or atrophy  Extremities no clubbing cyanosis  edema Skin: warm and dry,   device pocket is well healed without teathering Neuro:  Strength and sensation are intact Psych: euthymic mood, full affect  EKG:  EKG is ordered today. The ekg ordered today shows Atrial fibrillation at 81-/15/42  IVCD        Device interrogation is reviewed today in detail.  See PaceArt for details.   Recent  Labs: 01/10/2016: ALT 9; BUN 13; Creatinine, Ser 1.08; Hemoglobin 11.4; Platelets 179.0; Potassium 4.1; Sodium 140    Lipid Panel     Component Value Date/Time   CHOL 177 01/10/2016 0907   TRIG 88.0 01/10/2016 0907   HDL 53.10 01/10/2016 0907   CHOLHDL 3 01/10/2016 0907   VLDL 17.6 01/10/2016 0907   LDLCALC 107 (H) 01/10/2016 0907     Wt Readings from Last 3 Encounters:  04/27/16 163 lb 3.2 oz (74 kg)  04/04/16 173 lb 6.4 oz (78.7 kg)  01/10/16 170 lb (77.1 kg)      Other studies Reviewed: Additional studies/ records that were reviewed today include:  Myoview scan 2012 demonstrated large scar without ischemia EF was 29%      ASSESSMENT AND PLAN:  Ischemic cardio myopathy  Implantable defibrillator-Medtronic-single chamber  The patient's device was interrogated.  The information was reviewed. No changes were made in the programming.    Hypertension   Atrial fibrillation   Congestive heart failure-chronic-systolic   He has more symptoms of heart failure in the context of atrial fibrillation. We will anticoagulate him with Xarelto 20 mg as his creatinine is 1.08. Will anticipate cardioversion in 3 weeks. Hopefully his shortness of breath and dizziness will abate. He has known significant left ventricular dysfunction. Unfortunately his ECG is in a IVCD pattern but it is associated with significant notching which may be a predictor of CRT benefit. If his symptoms do not improve would consider upgradede    Continue current meds otherwise as no evidence of volume overload\  Stop ASA   For cardiomyopathy Aldactone as well as Entresto are reasonable things to consider . When he returns following cardioversion I would add low-dose Aldactone.    Current medicines are reviewed at length with the patient today.   The patient does not have concerns regarding his medicines.  The following changes were made today:  none  Labs/ tests ordered today include: As above  No orders  of  the defined types were placed in this encounter.    Disposition:   FU with me  In 012 m Signed, Virl Axe, MD  04/27/2016 9:26 AM     [address] [place] [address] [phone] (office) [phone] (fax)

## 2016-04-27 ENCOUNTER — Encounter: Payer: Self-pay | Admitting: *Deleted

## 2016-04-27 ENCOUNTER — Ambulatory Visit (INDEPENDENT_AMBULATORY_CARE_PROVIDER_SITE_OTHER): Payer: Medicare Other | Admitting: Internal Medicine

## 2016-04-27 ENCOUNTER — Telehealth (HOSPITAL_COMMUNITY): Payer: Self-pay | Admitting: *Deleted

## 2016-04-27 ENCOUNTER — Encounter: Payer: Self-pay | Admitting: Internal Medicine

## 2016-04-27 VITALS — BP 120/70 | HR 87 | Ht 72.0 in | Wt 163.2 lb

## 2016-04-27 DIAGNOSIS — I480 Paroxysmal atrial fibrillation: Secondary | ICD-10-CM

## 2016-04-27 DIAGNOSIS — Z95810 Presence of automatic (implantable) cardiac defibrillator: Secondary | ICD-10-CM

## 2016-04-27 DIAGNOSIS — I5022 Chronic systolic (congestive) heart failure: Secondary | ICD-10-CM | POA: Diagnosis not present

## 2016-04-27 DIAGNOSIS — I255 Ischemic cardiomyopathy: Secondary | ICD-10-CM | POA: Diagnosis not present

## 2016-04-27 DIAGNOSIS — Z01812 Encounter for preprocedural laboratory examination: Secondary | ICD-10-CM

## 2016-04-27 LAB — CUP PACEART INCLINIC DEVICE CHECK
Battery Voltage: 2.93 V
Brady Statistic RV Percent Paced: 0.67 %
Date Time Interrogation Session: 20180129145500
HighPow Impedance: 43 Ohm
HighPow Impedance: 63 Ohm
Implantable Lead Implant Date: 20041020
Implantable Lead Implant Date: 20100913
Implantable Lead Location: 753860
Implantable Lead Location: 753860
Implantable Lead Model: 5076
Implantable Lead Model: 6949
Implantable Pulse Generator Implant Date: 20100913
Lead Channel Impedance Value: 532 Ohm
Lead Channel Pacing Threshold Amplitude: 1.25 V
Lead Channel Pacing Threshold Pulse Width: 0.8 ms
Lead Channel Sensing Intrinsic Amplitude: 31.625 mV
Lead Channel Sensing Intrinsic Amplitude: 31.625 mV
Lead Channel Setting Pacing Amplitude: 2.5 V
Lead Channel Setting Pacing Pulse Width: 0.8 ms
Lead Channel Setting Sensing Sensitivity: 0.45 mV

## 2016-04-27 MED ORDER — RIVAROXABAN 20 MG PO TABS: 20.0000 mg | ORAL_TABLET | Freq: Every day | ORAL | 0 refills | Status: DC

## 2016-04-27 NOTE — Telephone Encounter (Signed)
lmom for pt to [facility] to sched 6 week f/u

## 2016-04-27 NOTE — Patient Instructions (Addendum)
Medication Instructions: Your physician has recommended you make the following change in your medication:  -1) Start Xarelto 20 mg - Take 1 tablet by mouth daily with or immediately following a meal -2) STOP ASPIRIN **A 3 week sample supply will be given to yo today in office as well as a free 30 day trial card**    Labwork: Your physician recommends that you return for lab work in 2 weeks for a BMET and CBC (05/15/16)   Procedures/Testing: Your physician has recommended that you have a Cardioversion (DCCV). Electrical Cardioversion uses a jolt of electricity to your heart either through paddles or wired patches attached to your chest. This is a controlled, usually prescheduled, procedure. Defibrillation is done under light anesthesia in the hospital, and you usually go home the day of the procedure. This is done to get your heart back into a normal rhythm. You are not awake for the procedure. Please see the instruction sheet given to you today.    Follow-Up: Your physician recommends that you schedule a follow-up appointment in 6 WEEKS with Roderic Palau, NP in [facility]   Any Additional Special Instructions Will Be Listed Below (If Applicable).   Electrical Cardioversion Electrical cardioversion is the delivery of a jolt of electricity to restore a normal rhythm to the heart. A rhythm that is too fast or is not regular keeps the heart from pumping well. In this procedure, sticky patches or metal paddles are placed on the chest to deliver electricity to the heart from a device. This procedure may be done in an emergency if:  There is low or no blood pressure as a result of the heart rhythm.  Normal rhythm must be restored as fast as possible to protect the brain and heart from further damage.  It may save a life. This procedure may also be done for irregular or fast heart rhythms that are not immediately life-threatening. Tell a health care provider about:  Any allergies you  have.  All medicines you are taking, including vitamins, herbs, eye drops, creams, and over-the-counter medicines.  Any problems you or family members have had with anesthetic medicines.  Any blood disorders you have.  Any surgeries you have had.  Any medical conditions you have.  Whether you are pregnant or may be pregnant. What are the risks? Generally, this is a safe procedure. However, problems may occur, including:  Allergic reactions to medicines.  A blood clot that breaks free and travels to other parts of your body.  The possible return of an abnormal heart rhythm within hours or days after the procedure.  Your heart stopping (cardiac arrest). This is rare. What happens before the procedure? Medicines  Your health care provider may have you start taking:  Blood-thinning medicines (anticoagulants) so your blood does not clot as easily.  Medicines may be given to help stabilize your heart rate and rhythm.  Ask your health care provider about changing or stopping your regular medicines. This is especially important if you are taking diabetes medicines or blood thinners. General instructions  Plan to have someone take you home from the hospital or clinic.  If you will be going home right after the procedure, plan to have someone with you for 24 hours.  Follow instructions from your health care provider about eating or drinking restrictions. What happens during the procedure?  To lower your risk of infection:  Your health care team will wash or sanitize their hands.  Your skin will be washed with  soap.  An IV tube will be inserted into one of your veins.  You will be given a medicine to help you relax (sedative).  Sticky patches (electrodes) or metal paddles may be placed on your chest.  An electrical shock will be delivered. The procedure may vary among health care providers and hospitals. What happens after the procedure?  Your blood pressure, heart  rate, breathing rate, and blood oxygen level will be monitored until the medicines you were given have worn off.  Do not drive for 24 hours if you were given a sedative.  Your heart rhythm will be watched to make sure it does not change. This information is not intended to replace advice given to you by your health care provider. Make sure you discuss any questions you have with your health care provider. Document Released: 03/06/2002 Document Revised: 11/13/2015 Document Reviewed: 09/20/2015 Elsevier Interactive Patient Education  2017 [employer].

## 2016-04-27 NOTE — Telephone Encounter (Signed)
Pt cld to sched

## 2016-05-11 ENCOUNTER — Other Ambulatory Visit: Payer: Medicare Other | Admitting: *Deleted

## 2016-05-11 DIAGNOSIS — I480 Paroxysmal atrial fibrillation: Secondary | ICD-10-CM | POA: Diagnosis not present

## 2016-05-11 DIAGNOSIS — Z01812 Encounter for preprocedural laboratory examination: Secondary | ICD-10-CM | POA: Diagnosis not present

## 2016-05-11 DIAGNOSIS — H353221 Exudative age-related macular degeneration, left eye, with active choroidal neovascularization: Secondary | ICD-10-CM | POA: Diagnosis not present

## 2016-05-11 LAB — CBC WITH DIFFERENTIAL/PLATELET
Basophils Absolute: 0 10*3/uL (ref 0.0–0.2)
Basos: 1 %
EOS (ABSOLUTE): 0.2 10*3/uL (ref 0.0–0.4)
Eos: 4 %
Hematocrit: 36.1 % — ABNORMAL LOW (ref 37.5–51.0)
Hemoglobin: 11.7 g/dL — ABNORMAL LOW (ref 13.0–17.7)
Immature Grans (Abs): 0 10*3/uL (ref 0.0–0.1)
Immature Granulocytes: 0 %
Lymphocytes Absolute: 1.5 10*3/uL (ref 0.7–3.1)
Lymphs: 28 %
MCH: 26.7 pg (ref 26.6–33.0)
MCHC: 32.4 g/dL (ref 31.5–35.7)
MCV: 82 fL (ref 79–97)
Monocytes Absolute: 0.7 10*3/uL (ref 0.1–0.9)
Monocytes: 13 %
Neutrophils Absolute: 3 10*3/uL (ref 1.4–7.0)
Neutrophils: 54 %
Platelets: 168 10*3/uL (ref 150–379)
RBC: 4.39 x10E6/uL (ref 4.14–5.80)
RDW: 15.6 % — ABNORMAL HIGH (ref 12.3–15.4)
WBC: 5.4 10*3/uL (ref 3.4–10.8)

## 2016-05-11 LAB — BASIC METABOLIC PANEL
BUN/Creatinine Ratio: 10 (ref 10–24)
BUN: 13 mg/dL (ref 8–27)
CO2: 24 mmol/L (ref 18–29)
Calcium: 9 mg/dL (ref 8.6–10.2)
Chloride: 103 mmol/L (ref 96–106)
Creatinine, Ser: 1.25 mg/dL (ref 0.76–1.27)
GFR calc Af Amer: 65 mL/min/{1.73_m2} (ref >59)
GFR calc non Af Amer: 56 mL/min/{1.73_m2} — ABNORMAL LOW (ref >59)
Glucose: 118 mg/dL — ABNORMAL HIGH (ref 65–99)
Potassium: 4 mmol/L (ref 3.5–5.2)
Sodium: 143 mmol/L (ref 134–144)

## 2016-05-15 ENCOUNTER — Other Ambulatory Visit: Payer: Medicare Other

## 2016-05-22 ENCOUNTER — Ambulatory Visit (HOSPITAL_COMMUNITY): Payer: Medicare Other | Admitting: Anesthesiology

## 2016-05-22 ENCOUNTER — Encounter (HOSPITAL_COMMUNITY)
Admission: RE | Disposition: A | Discharge status: Home / Self Care | Payer: Self-pay | Source: Ambulatory Visit | Attending: Cardiology

## 2016-05-22 ENCOUNTER — Encounter (HOSPITAL_COMMUNITY): Payer: Self-pay | Admitting: *Deleted

## 2016-05-22 ENCOUNTER — Ambulatory Visit (HOSPITAL_COMMUNITY)
Admission: RE | Admit: 2016-05-22 | Discharge: 2016-05-22 | Disposition: A | Discharge status: Home / Self Care | Payer: Medicare Other | Source: Ambulatory Visit | Attending: Cardiology | Admitting: Cardiology

## 2016-05-22 DIAGNOSIS — I110 Hypertensive heart disease with heart failure: Secondary | ICD-10-CM | POA: Insufficient documentation

## 2016-05-22 DIAGNOSIS — Z955 Presence of coronary angioplasty implant and graft: Secondary | ICD-10-CM | POA: Diagnosis not present

## 2016-05-22 DIAGNOSIS — K219 Gastro-esophageal reflux disease without esophagitis: Secondary | ICD-10-CM | POA: Diagnosis not present

## 2016-05-22 DIAGNOSIS — Z7982 Long term (current) use of aspirin: Secondary | ICD-10-CM | POA: Insufficient documentation

## 2016-05-22 DIAGNOSIS — Z951 Presence of aortocoronary bypass graft: Secondary | ICD-10-CM | POA: Diagnosis not present

## 2016-05-22 DIAGNOSIS — Z95810 Presence of automatic (implantable) cardiac defibrillator: Secondary | ICD-10-CM | POA: Insufficient documentation

## 2016-05-22 DIAGNOSIS — I255 Ischemic cardiomyopathy: Secondary | ICD-10-CM | POA: Diagnosis not present

## 2016-05-22 DIAGNOSIS — E785 Hyperlipidemia, unspecified: Secondary | ICD-10-CM | POA: Insufficient documentation

## 2016-05-22 DIAGNOSIS — I5022 Chronic systolic (congestive) heart failure: Secondary | ICD-10-CM | POA: Insufficient documentation

## 2016-05-22 DIAGNOSIS — R0602 Shortness of breath: Secondary | ICD-10-CM | POA: Diagnosis present

## 2016-05-22 DIAGNOSIS — I2510 Atherosclerotic heart disease of native coronary artery without angina pectoris: Secondary | ICD-10-CM | POA: Diagnosis not present

## 2016-05-22 DIAGNOSIS — I252 Old myocardial infarction: Secondary | ICD-10-CM | POA: Insufficient documentation

## 2016-05-22 DIAGNOSIS — I481 Persistent atrial fibrillation: Secondary | ICD-10-CM | POA: Insufficient documentation

## 2016-05-22 DIAGNOSIS — I350 Nonrheumatic aortic (valve) stenosis: Secondary | ICD-10-CM | POA: Diagnosis not present

## 2016-05-22 DIAGNOSIS — Z79899 Other long term (current) drug therapy: Secondary | ICD-10-CM | POA: Insufficient documentation

## 2016-05-22 DIAGNOSIS — I4891 Unspecified atrial fibrillation: Secondary | ICD-10-CM | POA: Diagnosis not present

## 2016-05-22 DIAGNOSIS — D649 Anemia, unspecified: Secondary | ICD-10-CM | POA: Diagnosis not present

## 2016-05-22 HISTORY — PX: CARDIOVERSION: SHX1299

## 2016-05-22 SURGERY — CARDIOVERSION
Anesthesia: General

## 2016-05-22 MED ORDER — SODIUM CHLORIDE 0.9 % IV SOLN
INTRAVENOUS | Status: DC
  Administered 2016-05-22: 07:00:00 via INTRAVENOUS

## 2016-05-22 MED ORDER — LIDOCAINE 2% (20 MG/ML) 5 ML SYRINGE
INTRAMUSCULAR | Status: DC | PRN
  Administered 2016-05-22: 50 mg via INTRAVENOUS

## 2016-05-22 MED ORDER — PROPOFOL 10 MG/ML IV BOLUS
INTRAVENOUS | Status: DC | PRN
  Administered 2016-05-22: 80 mg via INTRAVENOUS

## 2016-05-22 NOTE — Transfer of Care (Signed)
Immediate Anesthesia Transfer of Care Note  Patient: Mark Clayton  Procedure(s) Performed: Procedure(s): CARDIOVERSION (N/A)  Patient Location: Endoscopy Unit  Anesthesia Type:General  Level of Consciousness: awake, alert , oriented and patient cooperative  Airway & Oxygen Therapy: Patient Spontanous Breathing and Patient connected to nasal cannula oxygen  Post-op Assessment: Report given to RN, Post -op Vital signs reviewed and stable and Patient moving all extremities X 4  Post vital signs: Reviewed and stable  Last Vitals:  Vitals:   05/22/16 0642 05/22/16 0808  BP: 135/84 124/69  Pulse: 87 74  Resp: 10 19  Temp: 36.5 C     Last Pain:  Vitals:   05/22/16 0642  TempSrc: Oral         Complications: No apparent anesthesia complications

## 2016-05-22 NOTE — Procedures (Signed)
Electrical Cardioversion Procedure Note Mark Clayton [id number] 1940/10/30  Procedure: Electrical Cardioversion Indications:  Atrial Fibrillation  Procedure Details Consent: Risks of procedure as well as the alternatives and risks of each were explained to the (patient/caregiver).  Consent for procedure obtained. Time Out: Verified patient identification, verified procedure, site/side was marked, verified correct patient position, special equipment/implants available, medications/allergies/relevent history reviewed, required imaging and test results available.  Performed  Patient placed on cardiac monitor, pulse oximetry, supplemental oxygen as necessary.  Sedation given: Pt sedated by anesthesia with lidocaine 30 mg and diprovan 80 mg IV. Pacer pads placed anterior and posterior chest.  Cardioverted 1 time(s).  Cardioverted at 120J.  Evaluation Findings: Post procedure EKG shows: NSR Complications: None Patient did tolerate procedure well.   Mark Clayton 05/22/2016, 7:37 AM

## 2016-05-22 NOTE — Anesthesia Postprocedure Evaluation (Signed)
Anesthesia Post Note  Patient: Mark Clayton  Procedure(s) Performed: Procedure(s) (LRB): CARDIOVERSION (N/A)  Patient location during evaluation: PACU Anesthesia Type: General Level of consciousness: awake Pain management: pain level controlled Respiratory status: spontaneous breathing Cardiovascular status: stable Anesthetic complications: no       Last Vitals:  Vitals:   05/22/16 0828 05/22/16 0835  BP: 132/85 (!) 149/78  Pulse: 66 69  Resp: 14 14  Temp:      Last Pain:  Vitals:   05/22/16 0818  TempSrc: Oral                 Undine Nealis

## 2016-05-22 NOTE — Anesthesia Postprocedure Evaluation (Signed)
Anesthesia Post Note  Patient: Mark Clayton  Procedure(s) Performed: Procedure(s) (LRB): CARDIOVERSION (N/A)  Patient location during evaluation: PACU Anesthesia Type: General Pain management: pain level controlled Vital Signs Assessment: post-procedure vital signs reviewed and stable Respiratory status: spontaneous breathing Cardiovascular status: stable Anesthetic complications: no       Last Vitals:  Vitals:   05/22/16 0828 05/22/16 0835  BP: 132/85 (!) 149/78  Pulse: 66 69  Resp: 14 14  Temp:      Last Pain:  Vitals:   05/22/16 0818  TempSrc: Oral                 Yailin Biederman

## 2016-05-22 NOTE — Anesthesia Preprocedure Evaluation (Signed)
Anesthesia Evaluation  Patient identified by MRN, date of birth, ID band Patient awake    Reviewed: Allergy & Precautions, NPO status , Patient's Chart, lab work & pertinent test results  Airway Mallampati: II  TM Distance: >3 FB     Dental   Pulmonary neg pulmonary ROS,    breath sounds clear to auscultation       Cardiovascular hypertension, + CAD, + Past MI and +CHF  + Cardiac Defibrillator  Rhythm:Regular Rate:Normal     Neuro/Psych    GI/Hepatic Neg liver ROS, GERD  ,  Endo/Other  negative endocrine ROS  Renal/GU negative Renal ROS     Musculoskeletal  (+) Arthritis ,   Abdominal   Peds  Hematology  (+) anemia ,   Anesthesia Other Findings   Reproductive/Obstetrics                             Anesthesia Physical Anesthesia Plan  ASA: III  Anesthesia Plan: General   Post-op Pain Management:    Induction: Intravenous  Airway Management Planned: Mask and Simple Face Mask  Additional Equipment:   Intra-op Plan:   Post-operative Plan:   Informed Consent:   Dental advisory given  Plan Discussed with: CRNA and Anesthesiologist  Anesthesia Plan Comments:         Anesthesia Quick Evaluation

## 2016-05-22 NOTE — Discharge Instructions (Signed)
Electrical Cardioversion, Care After °This sheet gives you information about how to care for yourself after your procedure. Your health care provider may also give you more specific instructions. If you have problems or questions, contact your health care provider. °What can I expect after the procedure? °After the procedure, it is common to have: °· Some redness on the skin where the shocks were given. °Follow these instructions at home: °· Do not drive for 24 hours if you were given a medicine to help you relax (sedative). °· Take over-the-counter and prescription medicines only as told by your health care provider. °· Ask your health care provider how to check your pulse. Check it often. °· Rest for 48 hours after the procedure or as told by your health care provider. °· Avoid or limit your caffeine use as told by your health care provider. °Contact a health care provider if: °· You feel like your heart is beating too quickly or your pulse is not regular. °· You have a serious muscle cramp that does not go away. °Get help right away if: °· You have discomfort in your chest. °· You are dizzy or you feel faint. °· You have trouble breathing or you are short of breath. °· Your speech is slurred. °· You have trouble moving an arm or leg on one side of your body. °· Your fingers or toes turn cold or blue. °This information is not intended to replace advice given to you by your health care provider. Make sure you discuss any questions you have with your health care provider. °Document Released: 01/04/2013 Document Revised: 10/18/2015 Document Reviewed: 09/20/2015 °Elsevier Interactive Patient Education © 2017 [employer] ° °

## 2016-05-22 NOTE — H&P (Signed)
Mark Clayton  04/27/2016 8:30 AM  Office Visit  MRN:  [id number]  Description: Male DOB: 1940/07/21 Provider: Deboraha Sprang, MD Department: [address] [facility]  Vitals   BP  120/70     Pulse  87     Ht  6' (1.829 m)     Wt  163 lb 3.2 oz (74 kg)     SpO2  97%      BMI  22.13 kg/m    Progress Notes   Deboraha Sprang, MD at 04/27/2016 8:30 AM   Status: Signed  Expand All Collapse All      Electrophysiology Office Note   Date:  04/27/2016   ID:  Mark Clayton, DOB 03-14-1941, MRN [id number]  PCP:  [name], MD        Cardiologist:   Primary Electrophysiologist:  Virl Axe, MD      No chief complaint on file.    History of Present Illness:  Mark Clayton is a 76 y.o. male is       Seen in followup for an ICD implanted for primary prevention. He had a 6949-lead which was replaced with a right ventricular pace sense lead at the time of generator replacement. Stress testing January 2012 demonstrated mild periinfarct ischemia. EF 30%   Cath 09/20/14 and was found to have native three-vessel disease including a 90% RCA, but his LIMA-LAD and SVG-OM2-OM3 were patent. However, the SVG-RPDA-RPLB was occluded between the RPDA and the RPLB. The native RCA had a 90% stenosis. Dr. Beau Fanny treated the native RCA with a drug-eluting stent reducing the stenosis to 0. The SVG-RPDA portion of the graft is still patent  He has been much improved post stenting;   His wife has been dx with liposarcoma enveloping her aorta and kidney  Currently undergoing XRT His wife died in 2015-09-01  He complaining of SOB and dizziness with some palpitations  DATE TEST     /14    Echo   EF 20 %   6/16    Myoview   EF   %   6/16 Cath EF 20%  Severe native three-vessel coronary artery disease. Patent LIMA to LAD. Patent jump graft SVG to OM 2 and OM 3. Patent SVG to PDA. The second portion of this graft which goes to the posterior lateral artery is occluded. >90%  distal RCA stenosis  Successful 4.0 x 24 Synergy drug-eluting stent placement             Past Medical History:  Diagnosis Date  . AICD (automatic cardioverter/defibrillator) present 01/17/2003   Medtronic Maximo 7232CX ICD, serial [id number]  . Anemia 02-06-11   takes oral iron  . Arthritis    hands, knees  . CAD (coronary artery disease) 2003   a. h/o MI and CABG in 2003. b. s/p DES to SVG-RPDA-RPLB in 08/2014.  Marland Kitchen Cancer of sigmoid colon ([place]) 2012   a. s/p colon surgery.  . Carotid bruit   . Chronic systolic CHF (congestive heart failure) (HCC)    a. EF 20% in 2014.  Marland Kitchen Cough   . GERD (gastroesophageal reflux disease) 02-06-11  . HTN (hypertension)   . Hyperlipidemia   . Ischemic cardiomyopathy    a. EF 20% in 2014. (Master study EF >20%)  . LV (left ventricular) mural thrombus    a. Last echo 12/2012 EF 20% with mural thrombus, mod-severe left atrial enlargement. Per Dr. Olin Pia notes from that time, echo in 2009 demonstrated something  similar - he had been on warfarin following prior MI and this was stopped after a number of months. Given lack of data regarding anticoagulation from chronic clot, he has not been on anticoagulation since.  . Myocardial infarct    2003        Past Surgical History:  Procedure Laterality Date  . CARDIAC CATHETERIZATION N/A 09/20/2014   Procedure: Left Heart Cath and Coronary Angiography;  Surgeon: Jettie Booze, MD; LAD 95%, D1 100%, CFX liner percent, OM 200%, OM 390%, RCA 90%, LIMA-LAD okay, SVG-OM 2-OM 3 minimal disease, SVG-RPDA-RPLB 100% between the RPDA and RPL     . CARDIAC CATHETERIZATION N/A 09/20/2014   Procedure: Coronary Stent Intervention;  Surgeon: Jettie Booze, MD; Synergy DES 4 x 24 mm reducing the stenosis to 5%   . CARDIAC DEFIBRILLATOR PLACEMENT  01/17/03   6949 lead. Medtronic. remote-no; with later revision  . CATARACT EXTRACTION W/ INTRAOCULAR LENS  IMPLANT, BILATERAL Bilateral  June/-July 2009   Dr. Katy Fitch  . COLON RESECTION  02/09/2011   Procedure: LAPAROSCOPIC SIGMOID COLON RESECTION;  Surgeon: Pedro Earls, MD;  Location: [facility];  Service: General;  Laterality: N/A;  Laparoscopic Assisted Sigmoid Colectomy  . COLON SURGERY    . COLONOSCOPY  08/31/2011   Procedure: COLONOSCOPY;  Surgeon: Jerene Bears, MD;  Location: [facility];  Service: Gastroenterology;  Laterality: N/A;  . COLONOSCOPY N/A 09/05/2012   Procedure: COLONOSCOPY;  Surgeon: Jerene Bears, MD;  Location: [facility];  Service: Gastroenterology;  Laterality: N/A;  . COLONOSCOPY N/A 04/18/2013   Procedure: COLONOSCOPY;  Surgeon: Jerene Bears, MD;  Location: [facility];  Service: Gastroenterology;  Laterality: N/A;  . COLONOSCOPY N/A 04/09/2014   Procedure: COLONOSCOPY;  Surgeon: Jerene Bears, MD;  Location: [facility];  Service: Gastroenterology;  Laterality: N/A;  . CORONARY ANGIOPLASTY    . CORONARY ARTERY BYPASS GRAFT  01/2002   LIMA-LAD, SVG-OM 2-OM 3, SVG-RPDA-RPLB  . ICD GENERATOR CHANGE  2010   Medtronic Virtuoso II VR ICD  . INGUINAL HERNIA REPAIR Right 2000's X 2  . LAPAROSCOPIC RIGHT HEMI COLECTOMY N/A 11/04/2012   Procedure: LAPAROSCOPIC RIGHT HEMI COLECTOMY;  Surgeon: Pedro Earls, MD;  Location: [facility];  Service: General;  Laterality: N/A;  . TONSILLECTOMY  ~ 1950           Current Outpatient Prescriptions  Medication Sig Dispense Refill  . acetaminophen (TYLENOL) 500 MG tablet Take 500-1,000 mg by mouth daily as needed for moderate pain or headache.    Marland Kitchen aspirin 81 MG tablet Take 81 mg by mouth daily. At night.    . carvedilol (COREG) 25 MG tablet Take 1 tablet (25 mg total) by mouth at bedtime. 90 tablet 1  . furosemide (LASIX) 40 MG tablet Take 1 tablet (40 mg total) by mouth daily. 90 tablet 1  . HYDROcodone-homatropine (HYCODAN) 5-1.5 MG/5ML syrup Take 5 mLs by mouth every 8 (eight) hours as needed for cough. 120 mL 0  . isosorbide mononitrate  (IMDUR) 30 MG 24 hr tablet Take 1 tablet (30 mg total) by mouth daily. 90 tablet 0  . losartan (COZAAR) 50 MG tablet Take 1 tablet (50 mg total) by mouth daily. 90 tablet 1  . meclizine (ANTIVERT) 25 MG tablet Take 1 tablet (25 mg total) by mouth daily as needed for dizziness. 30 tablet 1  . nitroGLYCERIN (NITROSTAT) 0.4 MG SL tablet Place 1 tablet (0.4 mg total) under the tongue every 5 (five) minutes as needed for  chest pain (x 3 doses). 5 tablet 0  . pantoprazole (PROTONIX) 40 MG tablet Take 1 tablet (40 mg total) by mouth daily. 90 tablet 3  . pravastatin (PRAVACHOL) 20 MG tablet Take 1 tablet (20 mg total) by mouth at bedtime. 90 tablet 3  . predniSONE (DELTASONE) 20 MG tablet Take 2 tablets (40 mg total) by mouth daily with breakfast. 10 tablet 0  . PRESCRIPTION MEDICATION 1 each every 5 (five) weeks. Eye Injection every 5 weeks.     No current facility-administered medications for this visit.     Allergies:   Latex and Tape   Social History:  The patient  reports that he has never smoked. He has never used smokeless tobacco. He reports that he drinks about 4.2 oz of alcohol per week . He reports that he does not use drugs.   Family History:  The patient's family history includes Alzheimer's disease in his mother; Heart disease in his father; Hyperlipidemia in his father and sister; Hypertension in his father and sister; Prostate cancer in his father.    ROS:  Please see the history of present illness and past medical history  Otherwise, all other systems were reviewed and were negative .     PHYSICAL EXAM: VS:  BP 120/70   Pulse 87   Ht 6' (1.829 m)   Wt 163 lb 3.2 oz (74 kg)   SpO2 97%   BMI 22.13 kg/m  , BMI Body mass index is 22.13 kg/m. GEN: Well nourished, well developed, in no acute distress  HEENT: normal  Neck:  JVD flat , carotid bruits, or masses Cardiac: IRREGULAR RATE and RHYTHM ;  No murmurs, rubs, No S4  Back without kyphosis; No CVAT Respiratory:   clear to auscultation bilaterally, normal work of breathing GI: soft, nontender, nondistended, + BS MS: no deformity or atrophy  Extremities no clubbing cyanosis  edema Skin: warm and dry,   device pocket is well healed without teathering Neuro:  Strength and sensation are intact Psych: euthymic mood, full affect  EKG:  EKG is ordered today. The ekg ordered today shows Atrial fibrillation at 81-/15/42  IVCD        Device interrogation is reviewed today in detail.  See PaceArt for details.   Recent Labs: 01/10/2016: ALT 9; BUN 13; Creatinine, Ser 1.08; Hemoglobin 11.4; Platelets 179.0; Potassium 4.1; Sodium 140    Lipid Panel  Labs (Brief)          Component Value Date/Time   CHOL 177 01/10/2016 0907   TRIG 88.0 01/10/2016 0907   HDL 53.10 01/10/2016 0907   CHOLHDL 3 01/10/2016 0907   VLDL 17.6 01/10/2016 0907   LDLCALC 107 (H) 01/10/2016 0907          Wt Readings from Last 3 Encounters:  04/27/16 163 lb 3.2 oz (74 kg)  04/04/16 173 lb 6.4 oz (78.7 kg)  01/10/16 170 lb (77.1 kg)      Other studies Reviewed: Additional studies/ records that were reviewed today include:  Myoview scan 2012 demonstrated large scar without ischemia EF was 29%      ASSESSMENT AND PLAN:  Ischemic cardio myopathy  Implantable defibrillator-Medtronic-single chamber  The patient's device was interrogated.  The information was reviewed. No changes were made in the programming.    Hypertension   Atrial fibrillation   Congestive heart failure-chronic-systolic   He has more symptoms of heart failure in the context of atrial fibrillation. We will anticoagulate him with Xarelto 20 mg as  his creatinine is 1.08. Will anticipate cardioversion in 3 weeks. Hopefully his shortness of breath and dizziness will abate. He has known significant left ventricular dysfunction. Unfortunately his ECG is in a IVCD pattern but it is associated with significant notching which may be  a predictor of CRT benefit. If his symptoms do not improve would consider upgradede    Continue current meds otherwise as no evidence of volume overload\  Stop ASA   For cardiomyopathy Aldactone as well as Entresto are reasonable things to consider . When he returns following cardioversion I would add low-dose Aldactone.    Current medicines are reviewed at length with the patient today.   The patient does not have concerns regarding his medicines.  The following changes were made today:  none  Labs/ tests ordered today include: As above  No orders of the defined types were placed in this encounter.    Disposition:   FU with me  In 012 m Signed, Virl Axe, MD  04/27/2016 9:26 AM     [facility] [address] [place] [address] [phone] (office) [phone] (fax)     For DCCV; compliant with xarelto. [name]

## 2016-05-23 ENCOUNTER — Telehealth: Payer: Self-pay | Admitting: Cardiovascular Disease

## 2016-05-23 NOTE — Telephone Encounter (Signed)
Mark Clayton has been in and out of afib through the day, particularly during periods of ambulation, with ventricular rates in the 120-130bpm range during those periods.  When in NSR his rate is in the 70s and his SBP has been stable in the 120s.  I instructed him to take his evening dose of coreg 25mg  PO now and then a PRN dose of 12.5mg  later this evening if symptomatic afib returns.  I also asked that he take the coreg BID starting tomorrow as long as his SBP remains above 119mmHg.  He was able to repeat these instructions back to me and will follow them tonight.  [name] MD

## 2016-05-24 NOTE — Telephone Encounter (Signed)
Dr Olin Pia pt; will forward for further instructions. [name]

## 2016-05-25 ENCOUNTER — Telehealth: Payer: Self-pay | Admitting: Internal Medicine

## 2016-05-25 ENCOUNTER — Ambulatory Visit (INDEPENDENT_AMBULATORY_CARE_PROVIDER_SITE_OTHER): Payer: Medicare Other | Admitting: Internal Medicine

## 2016-05-25 ENCOUNTER — Ambulatory Visit (INDEPENDENT_AMBULATORY_CARE_PROVIDER_SITE_OTHER): Payer: Medicare Other

## 2016-05-25 ENCOUNTER — Encounter (HOSPITAL_COMMUNITY): Payer: Self-pay | Admitting: Cardiology

## 2016-05-25 ENCOUNTER — Other Ambulatory Visit: Payer: Self-pay | Admitting: Internal Medicine

## 2016-05-25 VITALS — BP 132/68 | HR 67 | Ht 72.0 in | Wt 163.0 lb

## 2016-05-25 DIAGNOSIS — I255 Ischemic cardiomyopathy: Secondary | ICD-10-CM | POA: Diagnosis not present

## 2016-05-25 DIAGNOSIS — Z95810 Presence of automatic (implantable) cardiac defibrillator: Secondary | ICD-10-CM | POA: Diagnosis not present

## 2016-05-25 DIAGNOSIS — I5022 Chronic systolic (congestive) heart failure: Secondary | ICD-10-CM

## 2016-05-25 DIAGNOSIS — I480 Paroxysmal atrial fibrillation: Secondary | ICD-10-CM

## 2016-05-25 MED ORDER — RIVAROXABAN 20 MG PO TABS: 20.0000 mg | ORAL_TABLET | Freq: Every day | ORAL | 3 refills | Status: DC

## 2016-05-25 NOTE — Telephone Encounter (Signed)
Reviewed with Dr. Caryl Comes- have the patient to come in at 11am  Or 1:30 pm.   I spoke with the patient- he will come in at 11:00 am to be seen.

## 2016-05-25 NOTE — Telephone Encounter (Signed)
New Message:    Pt said he had his heart shocked on Friday at the hospital.Saturday he was back in atrial fib.He called on Saturday and was instructed to call the office on Monday to be seen,

## 2016-05-25 NOTE — Patient Instructions (Signed)
Medication Instructions: - Your physician recommends that you continue on your current medications as directed. Please refer to the Current Medication list given to you today.  Labwork: - none ordered  Procedures/Testing: - Your physician has recommended that you wear a 48 hour holter monitor. Holter monitors are medical devices that record the heart's electrical activity. Doctors most often use these monitors to diagnose arrhythmias. Arrhythmias are problems with the speed or rhythm of the heartbeat. The monitor is a small, portable device. You can wear one while you do your normal daily activities. This is usually used to diagnose what is causing palpitations/syncope (passing out).  Follow-Up: - Pending   Any Additional Special Instructions Will Be Listed Below (If Applicable).     If you need a refill on your cardiac medications before your next appointment, please call your pharmacy.

## 2016-05-25 NOTE — Progress Notes (Signed)
Electrophysiology Office Note   Date:  05/25/2016   ID:  Mark Clayton, Mark Clayton [birth year], MRN [id number]  PCP:  [name], MD  Cardiologist:   Primary Electrophysiologist:  Virl Axe, MD    No chief complaint on file.    History of Present Illness:  Mark Clayton is a 76 y.o. male is       Seen as an add-on today because of new complaints of fever and chills myalgias in this context of having been cardioverted last week.    He has a history of  ICD implanted for primary prevention. He had a 6949-lead which was replaced with a right ventricular pace sense lead at the time of generator replacement. Stress testing January 2012 demonstrated mild periinfarct ischemia. EF 30%   Cath 09/20/14 and was found to have native three-vessel disease including a 90% RCA, but his LIMA-LAD and SVG-OM2-OM3 were patent. However, the SVG-RPDA-RPLB was occluded between the RPDA and the RPLB. The native RCA had a 90% stenosis. Dr. Beau Fanny treated the native RCA with a drug-eluting stent reducing the stenosis to 0. The SVG-RPDA portion of the graft is still patent  He had been much improved post stenting;  more recently, he was having problems with shortness of breath. It occurred concurrent with the developing of atrial fibrillation. He was admitted for anticoagulation and cardioversion which was accomplished last week. He did not feel any better over the weekend and noted that his heart was out of rhythm. However, he also noted that he ached all over particularly with movement.  He had low-grade fevers and some chills.     His wife has been dx with liposarcoma enveloping her aorta and kidney  Currently undergoing XRT His wife died in 16-Sep-2015     DATE TEST     /14    Echo   EF 20 %   6/16    Myoview   EF   %   6/16 Cath EF 20%  Severe native three-vessel coronary artery disease. Patent LIMA to LAD. Patent jump graft SVG to OM 2 and OM 3. Patent SVG to PDA. The second portion of this  graft which goes to the posterior lateral artery is occluded. >90% distal RCA stenosis  Successful 4.0 x 24 Synergy drug-eluting stent placement         Past Medical History:  Diagnosis Date  . AICD (automatic cardioverter/defibrillator) present 01/17/2003   Medtronic Maximo 7232CX ICD, serial [id number]  . Anemia 02-06-11   takes oral iron  . Arthritis    hands, knees  . CAD (coronary artery disease) 2003   a. h/o MI and CABG in 2003. b. s/p DES to SVG-RPDA-RPLB in 08/2014.  Marland Kitchen Cancer of sigmoid colon ([place]) 2012   a. s/p colon surgery.  . Carotid bruit   . Chronic systolic CHF (congestive heart failure) (HCC)    a. EF 20% in 2014.  Marland Kitchen Cough   . GERD (gastroesophageal reflux disease) 02-06-11  . HTN (hypertension)   . Hyperlipidemia   . Ischemic cardiomyopathy    a. EF 20% in 2014. (Master study EF >20%)  . LV (left ventricular) mural thrombus    a. Last echo 12/2012 EF 20% with mural thrombus, mod-severe left atrial enlargement. Per Dr. Olin Pia notes from that time, echo in 2009 demonstrated something similar - he had been on warfarin following prior MI and this was stopped after a number of months. Given lack of data regarding anticoagulation from chronic  clot, he has not been on anticoagulation since.  . Myocardial infarct    2003   Past Surgical History:  Procedure Laterality Date  . CARDIAC CATHETERIZATION N/A 09/20/2014   Procedure: Left Heart Cath and Coronary Angiography;  Surgeon: Jettie Booze, MD; LAD 95%, D1 100%, CFX liner percent, OM 200%, OM 390%, RCA 90%, LIMA-LAD okay, SVG-OM 2-OM 3 minimal disease, SVG-RPDA-RPLB 100% between the RPDA and RPL     . CARDIAC CATHETERIZATION N/A 09/20/2014   Procedure: Coronary Stent Intervention;  Surgeon: Jettie Booze, MD; Synergy DES 4 x 24 mm reducing the stenosis to 5%   . CARDIAC DEFIBRILLATOR PLACEMENT  01/17/03   6949 lead. Medtronic. remote-no; with later revision  . CARDIOVERSION N/A 05/22/2016   Procedure:  CARDIOVERSION;  Surgeon: Lelon Perla, MD;  Location: [facility];  Service: Cardiovascular;  Laterality: N/A;  . CATARACT EXTRACTION W/ INTRAOCULAR LENS  IMPLANT, BILATERAL Bilateral June/-July 2009   Dr. Katy Fitch  . COLON RESECTION  02/09/2011   Procedure: LAPAROSCOPIC SIGMOID COLON RESECTION;  Surgeon: Pedro Earls, MD;  Location: [facility];  Service: General;  Laterality: N/A;  Laparoscopic Assisted Sigmoid Colectomy  . COLON SURGERY    . COLONOSCOPY  08/31/2011   Procedure: COLONOSCOPY;  Surgeon: Jerene Bears, MD;  Location: [facility];  Service: Gastroenterology;  Laterality: N/A;  . COLONOSCOPY N/A 09/05/2012   Procedure: COLONOSCOPY;  Surgeon: Jerene Bears, MD;  Location: [facility];  Service: Gastroenterology;  Laterality: N/A;  . COLONOSCOPY N/A 04/18/2013   Procedure: COLONOSCOPY;  Surgeon: Jerene Bears, MD;  Location: [facility];  Service: Gastroenterology;  Laterality: N/A;  . COLONOSCOPY N/A 04/09/2014   Procedure: COLONOSCOPY;  Surgeon: Jerene Bears, MD;  Location: [facility];  Service: Gastroenterology;  Laterality: N/A;  . CORONARY ANGIOPLASTY    . CORONARY ARTERY BYPASS GRAFT  01/2002   LIMA-LAD, SVG-OM 2-OM 3, SVG-RPDA-RPLB  . ICD GENERATOR CHANGE  2010   Medtronic Virtuoso II VR ICD  . INGUINAL HERNIA REPAIR Right 2000's X 2  . LAPAROSCOPIC RIGHT HEMI COLECTOMY N/A 11/04/2012   Procedure: LAPAROSCOPIC RIGHT HEMI COLECTOMY;  Surgeon: Pedro Earls, MD;  Location: [facility];  Service: General;  Laterality: N/A;  . TONSILLECTOMY  ~ 1950     Current Outpatient Prescriptions  Medication Sig Dispense Refill  . acetaminophen (TYLENOL) 500 MG tablet Take 500-1,000 mg by mouth daily as needed for moderate pain or headache.    . carvedilol (COREG) 25 MG tablet Take 1 tablet (25 mg total) by mouth at bedtime. 90 tablet 1  . furosemide (LASIX) 40 MG tablet Take 1 tablet (40 mg total) by mouth daily. 90 tablet 1  . HYDROcodone-homatropine (HYCODAN) 5-1.5 MG/5ML syrup Take 5  mLs by mouth every 8 (eight) hours as needed for cough. 120 mL 0  . isosorbide mononitrate (IMDUR) 30 MG 24 hr tablet Take 1 tablet (30 mg total) by mouth daily. 90 tablet 0  . losartan (COZAAR) 50 MG tablet Take 1 tablet (50 mg total) by mouth daily. 90 tablet 1  . meclizine (ANTIVERT) 25 MG tablet Take 1 tablet (25 mg total) by mouth daily as needed for dizziness. 30 tablet 1  . nitroGLYCERIN (NITROSTAT) 0.4 MG SL tablet DISSOLVE 1 TABLET UNDER THE TONGUE EVERY 5 MINUTES AS  NEEDED FOR CHEST PAIN UP TO 3 DOSES 25 tablet 0  . pantoprazole (PROTONIX) 40 MG tablet Take 1 tablet (40 mg total) by mouth daily. 90 tablet 3  . pravastatin (PRAVACHOL)  20 MG tablet Take 1 tablet (20 mg total) by mouth at bedtime. 90 tablet 3  . predniSONE (DELTASONE) 20 MG tablet Take 2 tablets (40 mg total) by mouth daily with breakfast. 10 tablet 0  . PRESCRIPTION MEDICATION 1 each every 5 (five) weeks. Eye Injection every 5 weeks.    . rivaroxaban (XARELTO) 20 MG TABS tablet Take 1 tablet (20 mg total) by mouth daily with supper. 30 tablet 0   No current facility-administered medications for this visit.     Allergies:   Latex and Tape   Social History:  The patient  reports that he has never smoked. He has never used smokeless tobacco. He reports that he drinks about 4.2 oz of alcohol per week . He reports that he does not use drugs.   Family History:  The patient's family history includes Alzheimer's disease in his mother; Heart disease in his father; Hyperlipidemia in his father and sister; Hypertension in his father and sister; Prostate cancer in his father.    ROS:  Please see the history of present illness and past medical history  Otherwise, all other systems were reviewed and were negative .     PHYSICAL EXAM: VS:  BP 132/68   Pulse 67   Ht 6' (1.829 m)   Wt 163 lb (73.9 kg)   SpO2 98%   BMI 22.11 kg/m  , BMI Body mass index is 22.11 kg/m. GEN: Well nourished, well developed, in no acute distress    HEENT: normal  Neck:  JVD flat , carotid bruits, or masses Cardiac REGULAR RATE and RHYTHM ;  No murmurs, rubs, No S4  Back without kyphosis; No CVAT Respiratory:  clear to auscultation bilaterally, normal work of breathing GI: soft, nontender, nondistended, + BS MS: no deformity or atrophy  Extremities no clubbing cyanosis  edema Skin: warm and dry,   device pocket is well healed without teathering Neuro:  Strength and sensation are intact Psych: euthymic mood, full affect  EKG:  EKG is ordered today. Sinus rhythm with occasional PVCs IVCD        Device interrogation is reviewed today in detail.  See PaceArt for details.   Recent Labs: 01/10/2016: ALT 9; Hemoglobin 11.4 05/11/2016: BUN 13; Creatinine, Ser 1.25; Platelets 168; Potassium 4.0; Sodium 143    Lipid Panel     Component Value Date/Time   CHOL 177 01/10/2016 0907   TRIG 88.0 01/10/2016 0907   HDL 53.10 01/10/2016 0907   CHOLHDL 3 01/10/2016 0907   VLDL 17.6 01/10/2016 0907   LDLCALC 107 (H) 01/10/2016 0907     Wt Readings from Last 3 Encounters:  05/25/16 163 lb (73.9 kg)  05/22/16 162 lb (73.5 kg)  04/27/16 163 lb 3.2 oz (74 kg)      Other studies Reviewed: Additional studies/ records that were reviewed today include:  Myoview scan 2012 demonstrated large scar without ischemia EF was 29%      ASSESSMENT AND PLAN:  Ischemic cardio myopathy  Implantable defibrillator-Medtronic-single chamber  The patient's device was interrogated.  The information was reviewed. No changes were made in the programming.    Hypertension   Atrial fibrillation   Congestive heart failure-chronic-systolic  PVCs  Fever Myalgias   There is a constellation of issues potentially contributing to his feeling poor. The first is that his head. Atrial fibrillation by history. We will use a 48 hour Holter monitor to try and clarify this. He is also having PVCs and so the distinction is important.  He has fever and myalgias  as well as a cough. I wonder whether he doesn't have flu. We will give him a mask and suggest that he go to see his PCP.  For now we'll continue him on Rivaroxaban with what  he is having no bleeding issues  BP well controlled  It is his concern that his dyspnea and weakness is a manifestation of ischemia. This was the symptom complex that he recalls that preceded his stenting 4/16. In the event that his symptoms do not abate over time in the next couple of weeks we will need to consider repeat evaluation of his coronary anatomy.  Labs/ tests ordered today include: As above  No orders of the defined types were placed in this encounter.    Disposition:   FU with me  In 012 m Signed, Virl Axe, MD  05/25/2016 11:58 AM     [address] [place] [address] [phone] (office) [phone] (fax)

## 2016-05-25 NOTE — Telephone Encounter (Signed)
Follow up    Pt calling stating that he was calling back about previous message. He states he's been trying to talk to someone since Saturday.

## 2016-05-26 ENCOUNTER — Ambulatory Visit (INDEPENDENT_AMBULATORY_CARE_PROVIDER_SITE_OTHER): Payer: Medicare Other | Admitting: Internal Medicine

## 2016-05-26 ENCOUNTER — Other Ambulatory Visit (INDEPENDENT_AMBULATORY_CARE_PROVIDER_SITE_OTHER): Payer: Medicare Other

## 2016-05-26 ENCOUNTER — Encounter: Payer: Self-pay | Admitting: Internal Medicine

## 2016-05-26 ENCOUNTER — Other Ambulatory Visit: Payer: Self-pay | Admitting: Internal Medicine

## 2016-05-26 VITALS — BP 138/70 | HR 73 | Temp 98.1°F | Ht 72.0 in | Wt 168.0 lb

## 2016-05-26 DIAGNOSIS — R0602 Shortness of breath: Secondary | ICD-10-CM

## 2016-05-26 LAB — CBC
HCT: 34.7 % — ABNORMAL LOW (ref 39.0–52.0)
Hemoglobin: 11.4 g/dL — ABNORMAL LOW (ref 13.0–17.0)
MCHC: 32.9 g/dL (ref 30.0–36.0)
MCV: 83 fl (ref 78.0–100.0)
Platelets: 187 10*3/uL (ref 150.0–400.0)
RBC: 4.18 Mil/uL — ABNORMAL LOW (ref 4.22–5.81)
RDW: 17.3 % — ABNORMAL HIGH (ref 11.5–15.5)
WBC: 7.5 10*3/uL (ref 4.0–10.5)

## 2016-05-26 LAB — BRAIN NATRIURETIC PEPTIDE: Pro B Natriuretic peptide (BNP): 692 pg/mL — ABNORMAL HIGH (ref 0.0–100.0)

## 2016-05-26 NOTE — Progress Notes (Signed)
Pre visit review using our clinic review tool, if applicable. No additional management support is needed unless otherwise documented below in the visit note. 

## 2016-05-26 NOTE — Progress Notes (Signed)
   Subjective:    Patient ID: Mark Clayton, male    DOB: April 05, 1940, 76 y.o.   MRN: [id number]  HPI The patient is a 76 YO man coming in for concerns of not feeling well. He had cardioversion last Friday for A fib and is now back in sinus. He saw his cardiologist yesterday and they thought maybe he had the flu and sent him here. He admits to maybe some chills over the weekend. Feeling muscle aches after being in the hospital for cardioversion. He is having mild dry cough, worse with laying down. This gets better if he elevates with several pillows. His home weights are stable (163.5 this morning and about his usual) and he weighs every morning. He is getting SOB with even short exertions. He is on xarelto prior to the cardioversion and has been taking daily without missing. Having some nasal congestion but not severe. No fevers. Not a smoker. No swelling in his legs.   Review of Systems  Constitutional: Positive for activity change, chills and fatigue. Negative for appetite change, diaphoresis, fever and unexpected weight change.  HENT: Positive for congestion. Negative for ear discharge, ear pain, postnasal drip, rhinorrhea, sinus pain, sinus pressure, sore throat and trouble swallowing.   Eyes: Negative.   Respiratory: Positive for cough and shortness of breath. Negative for chest tightness and wheezing.   Cardiovascular: Negative.   Gastrointestinal: Negative.   Musculoskeletal: Negative.   Neurological: Negative.      Flu test in office: negative    Objective:   Physical Exam  Constitutional: He is oriented to person, place, and time. He appears well-developed and well-nourished.  HENT:  Head: Normocephalic and atraumatic.  Oropharynx with mild redness, no drainage, no sinus pressure.   Eyes: EOM are normal.  Neck: Normal range of motion. No JVD present.  Cardiovascular: Normal rate and regular rhythm.   Pulmonary/Chest: Effort normal and breath sounds normal. No respiratory  distress. He has no wheezes.  Speaking in full sentences without stopping to breathe  Abdominal: Soft.  Musculoskeletal: He exhibits no edema.  Lymphadenopathy:    He has no cervical adenopathy.  Neurological: He is alert and oriented to person, place, and time.  Dyspneic with short exertion  Skin: Skin is warm and dry.   Vitals:   05/26/16 1459  BP: 138/70  Pulse: 73  Temp: 98.1 F (36.7 C)  TempSrc: Oral  SpO2: 99%  Weight: 168 lb (76.2 kg)  Height: 6' (1.829 m)      Assessment & Plan:

## 2016-05-26 NOTE — Patient Instructions (Signed)
Your flu test was negative today which means you do not have the flu but you could have another viral illness.   We cannot do anything to help speed up a viral illness and they can last 7-10 days.   We would like to check a blood test today to look for signs of extra fluid as this can cause similar symptoms to yours.  If this is a viral illness you are likely about the peak of symptoms and we want you to call us back on Friday to let us know how you are doing.

## 2016-05-27 ENCOUNTER — Telehealth: Payer: Self-pay | Admitting: Internal Medicine

## 2016-05-27 DIAGNOSIS — I480 Paroxysmal atrial fibrillation: Secondary | ICD-10-CM

## 2016-05-27 DIAGNOSIS — R0602 Shortness of breath: Secondary | ICD-10-CM | POA: Insufficient documentation

## 2016-05-27 NOTE — Assessment & Plan Note (Signed)
With cough, could be viral illness but lungs without rhonchi on exam. His symptoms sound more like CHF exacerbation but he claims weights are stable. Flu test negative in the office. Will check CBC and BNP for cause of the SOB and treat as appropriate. If this is viral he is likely at the peak of symptoms and will check back with him in 2-3 days for any sign of improvement. If no etiology needs further investigation and need to find out when he started xarelto in relation to his cardioversion to see if we need to rule out PE (although oxygen levels good in the office today).

## 2016-05-27 NOTE — Telephone Encounter (Signed)
Pt called in and said that he as a few more question about how to take his meds.  Can you call him when you get a chance?

## 2016-05-27 NOTE — Addendum Note (Signed)
Addended by: Campbell Riches on: 05/27/2016 07:24 AM   Modules accepted: Orders

## 2016-05-27 NOTE — Telephone Encounter (Signed)
Contacted patietn and he wanted yo know if he needed to take more potassium pills along with the extra fluid pills for the 5 days. Please advise

## 2016-05-28 NOTE — Telephone Encounter (Signed)
Okay to keep the potassium the same

## 2016-05-28 NOTE — Telephone Encounter (Signed)
Patient contacted and stated awareness 

## 2016-06-01 ENCOUNTER — Telehealth: Payer: Self-pay | Admitting: Internal Medicine

## 2016-06-01 NOTE — Telephone Encounter (Signed)
New message    Pt is calling stating he wore a heart monitor last week. He is calling for results and about what to do now.

## 2016-06-01 NOTE — Telephone Encounter (Signed)
Pt called in today to tell you how he is feeling. He stopped taking his double dose of lasix yesterday and went back to his regular dose today.  He says he is feeling a lot better and he is walking very well. He also states he has dropped 6 lbs If you need anything else, please give him a call.

## 2016-06-01 NOTE — Telephone Encounter (Signed)
Patient contacted and stated awareness 

## 2016-06-01 NOTE — Telephone Encounter (Signed)
Good, back to normal dose lasix and watch weights. If increasing more than 3 pounds to call the office.

## 2016-06-01 NOTE — Telephone Encounter (Signed)
Called patient and advised him that monitor results are not available yet and that someone from our office will call back when the monitor is read. He asked me to let Dr. Caryl Comes know that Dr. Sharlet Salina, PCP, put him on double lasix for several days and he lost 6 lbs. He states he is out walking now and feeling well. I advised that I will forward that message to Dr. Caryl Comes and his nurse. He thanked me for the call.

## 2016-06-08 ENCOUNTER — Inpatient Hospital Stay (HOSPITAL_COMMUNITY): Admission: RE | Admit: 2016-06-08 | Payer: Medicare Other | Source: Ambulatory Visit | Admitting: Nurse Practitioner

## 2016-06-09 NOTE — Telephone Encounter (Signed)
I left a detailed message of the patient's results on his identified voice mail.

## 2016-06-15 ENCOUNTER — Ambulatory Visit (HOSPITAL_COMMUNITY)
Admission: RE | Admit: 2016-06-15 | Discharge: 2016-06-15 | Disposition: A | Discharge status: Home / Self Care | Payer: Medicare Other | Source: Ambulatory Visit | Attending: Nurse Practitioner | Admitting: Nurse Practitioner

## 2016-06-15 ENCOUNTER — Encounter (HOSPITAL_COMMUNITY): Payer: Self-pay | Admitting: Nurse Practitioner

## 2016-06-15 VITALS — BP 140/72 | HR 64 | Ht 72.0 in | Wt 163.0 lb

## 2016-06-15 DIAGNOSIS — I509 Heart failure, unspecified: Secondary | ICD-10-CM | POA: Diagnosis not present

## 2016-06-15 DIAGNOSIS — Z95810 Presence of automatic (implantable) cardiac defibrillator: Secondary | ICD-10-CM | POA: Insufficient documentation

## 2016-06-15 DIAGNOSIS — E785 Hyperlipidemia, unspecified: Secondary | ICD-10-CM | POA: Diagnosis not present

## 2016-06-15 DIAGNOSIS — M170 Bilateral primary osteoarthritis of knee: Secondary | ICD-10-CM | POA: Insufficient documentation

## 2016-06-15 DIAGNOSIS — H353132 Nonexudative age-related macular degeneration, bilateral, intermediate dry stage: Secondary | ICD-10-CM | POA: Diagnosis not present

## 2016-06-15 DIAGNOSIS — I4891 Unspecified atrial fibrillation: Secondary | ICD-10-CM | POA: Insufficient documentation

## 2016-06-15 DIAGNOSIS — Z820 Family history of epilepsy and other diseases of the nervous system: Secondary | ICD-10-CM | POA: Diagnosis not present

## 2016-06-15 DIAGNOSIS — Z8042 Family history of malignant neoplasm of prostate: Secondary | ICD-10-CM | POA: Diagnosis not present

## 2016-06-15 DIAGNOSIS — I2510 Atherosclerotic heart disease of native coronary artery without angina pectoris: Secondary | ICD-10-CM | POA: Insufficient documentation

## 2016-06-15 DIAGNOSIS — Z9842 Cataract extraction status, left eye: Secondary | ICD-10-CM | POA: Insufficient documentation

## 2016-06-15 DIAGNOSIS — Z91048 Other nonmedicinal substance allergy status: Secondary | ICD-10-CM | POA: Insufficient documentation

## 2016-06-15 DIAGNOSIS — K219 Gastro-esophageal reflux disease without esophagitis: Secondary | ICD-10-CM | POA: Diagnosis not present

## 2016-06-15 DIAGNOSIS — Z8249 Family history of ischemic heart disease and other diseases of the circulatory system: Secondary | ICD-10-CM | POA: Diagnosis not present

## 2016-06-15 DIAGNOSIS — I255 Ischemic cardiomyopathy: Secondary | ICD-10-CM | POA: Diagnosis not present

## 2016-06-15 DIAGNOSIS — Z85038 Personal history of other malignant neoplasm of large intestine: Secondary | ICD-10-CM | POA: Diagnosis not present

## 2016-06-15 DIAGNOSIS — Z951 Presence of aortocoronary bypass graft: Secondary | ICD-10-CM | POA: Diagnosis not present

## 2016-06-15 DIAGNOSIS — I110 Hypertensive heart disease with heart failure: Secondary | ICD-10-CM | POA: Diagnosis not present

## 2016-06-15 DIAGNOSIS — M19041 Primary osteoarthritis, right hand: Secondary | ICD-10-CM | POA: Insufficient documentation

## 2016-06-15 DIAGNOSIS — D649 Anemia, unspecified: Secondary | ICD-10-CM | POA: Insufficient documentation

## 2016-06-15 DIAGNOSIS — I480 Paroxysmal atrial fibrillation: Secondary | ICD-10-CM

## 2016-06-15 DIAGNOSIS — Z7901 Long term (current) use of anticoagulants: Secondary | ICD-10-CM | POA: Diagnosis not present

## 2016-06-15 DIAGNOSIS — Z9841 Cataract extraction status, right eye: Secondary | ICD-10-CM | POA: Insufficient documentation

## 2016-06-15 DIAGNOSIS — M19042 Primary osteoarthritis, left hand: Secondary | ICD-10-CM | POA: Diagnosis not present

## 2016-06-15 DIAGNOSIS — Z91040 Latex allergy status: Secondary | ICD-10-CM | POA: Insufficient documentation

## 2016-06-15 DIAGNOSIS — I252 Old myocardial infarction: Secondary | ICD-10-CM | POA: Diagnosis not present

## 2016-06-15 DIAGNOSIS — Z955 Presence of coronary angioplasty implant and graft: Secondary | ICD-10-CM | POA: Insufficient documentation

## 2016-06-15 DIAGNOSIS — H353221 Exudative age-related macular degeneration, left eye, with active choroidal neovascularization: Secondary | ICD-10-CM | POA: Diagnosis not present

## 2016-06-15 NOTE — Progress Notes (Signed)
Primary Care Physician: [name], MD Referring Physician: Dr. Jani Files Mark Clayton is a 76 y.o. male with a h/o ICD, CAD,EF 20% paroxsymal afib that had a previous cardioversion in the afib clinic for f/u of Dr. Aquilla Hacker visit. He presented to Dr. Caryl Comes for URI complaints following cardioversion and feeling he was out of rhythm. He was in SR with PAC's and questioned if he had the flu. A monitor was placed and he was referred to PCP. He tested negative for the flu but was thought to be fluid overloaded. His lasix was increased for five days and symptoms resolved. Monitor did not show any further afib. He has not noted any further irregular heart beat. He is in SR today and feels well.  Today, he denies symptoms of palpitations, chest pain, shortness of breath, orthopnea, PND, lower extremity edema, dizziness, presyncope, syncope, or neurologic sequela. The patient is tolerating medications without difficulties and is otherwise without complaint today.   Past Medical History:  Diagnosis Date  . AICD (automatic cardioverter/defibrillator) present 01/17/2003   Medtronic Maximo 7232CX ICD, serial [id number]  . Anemia 02-06-11   takes oral iron  . Arthritis    hands, knees  . CAD (coronary artery disease) 2003   a. h/o MI and CABG in 2003. b. s/p DES to SVG-RPDA-RPLB in 08/2014.  Marland Kitchen Cancer of sigmoid colon ([facility]) 2012   a. s/p colon surgery.  . Carotid bruit   . Chronic systolic CHF (congestive heart failure) (HCC)    a. EF 20% in 2014.  Marland Kitchen Cough   . GERD (gastroesophageal reflux disease) 02-06-11  . HTN (hypertension)   . Hyperlipidemia   . Ischemic cardiomyopathy    a. EF 20% in 2014. (Master study EF >20%)  . LV (left ventricular) mural thrombus    a. Last echo 12/2012 EF 20% with mural thrombus, mod-severe left atrial enlargement. Per Dr. Olin Pia notes from that time, echo in 2009 demonstrated something similar - he had been on warfarin following prior MI and this was stopped  after a number of months. Given lack of data regarding anticoagulation from chronic clot, he has not been on anticoagulation since.  . Myocardial infarct    2003   Past Surgical History:  Procedure Laterality Date  . CARDIAC CATHETERIZATION N/A 09/20/2014   Procedure: Left Heart Cath and Coronary Angiography;  Surgeon: Jettie Booze, MD; LAD 95%, D1 100%, CFX liner percent, OM 200%, OM 390%, RCA 90%, LIMA-LAD okay, SVG-OM 2-OM 3 minimal disease, SVG-RPDA-RPLB 100% between the RPDA and RPL     . CARDIAC CATHETERIZATION N/A 09/20/2014   Procedure: Coronary Stent Intervention;  Surgeon: Jettie Booze, MD; Synergy DES 4 x 24 mm reducing the stenosis to 5%   . CARDIAC DEFIBRILLATOR PLACEMENT  01/17/03   6949 lead. Medtronic. remote-no; with later revision  . CARDIOVERSION N/A 05/22/2016   Procedure: CARDIOVERSION;  Surgeon: Lelon Perla, MD;  Location: [facility] ENDOSCOPY;  Service: Cardiovascular;  Laterality: N/A;  . CATARACT EXTRACTION W/ INTRAOCULAR LENS  IMPLANT, BILATERAL Bilateral June/-July 2009   Dr. Katy Fitch  . COLON RESECTION  02/09/2011   Procedure: LAPAROSCOPIC SIGMOID COLON RESECTION;  Surgeon: Pedro Earls, MD;  Location: [facility] ORS;  Service: General;  Laterality: N/A;  Laparoscopic Assisted Sigmoid Colectomy  . COLON SURGERY    . COLONOSCOPY  08/31/2011   Procedure: COLONOSCOPY;  Surgeon: Jerene Bears, MD;  Location: [facility] ENDOSCOPY;  Service: Gastroenterology;  Laterality: N/A;  . COLONOSCOPY N/A 09/05/2012  Procedure: COLONOSCOPY;  Surgeon: Jerene Bears, MD;  Location: [facility] ENDOSCOPY;  Service: Gastroenterology;  Laterality: N/A;  . COLONOSCOPY N/A 04/18/2013   Procedure: COLONOSCOPY;  Surgeon: Jerene Bears, MD;  Location: [facility] ENDOSCOPY;  Service: Gastroenterology;  Laterality: N/A;  . COLONOSCOPY N/A 04/09/2014   Procedure: COLONOSCOPY;  Surgeon: Jerene Bears, MD;  Location: [facility] ENDOSCOPY;  Service: Gastroenterology;  Laterality: N/A;  . CORONARY ANGIOPLASTY    . CORONARY ARTERY  BYPASS GRAFT  01/2002   LIMA-LAD, SVG-OM 2-OM 3, SVG-RPDA-RPLB  . ICD GENERATOR CHANGE  2010   Medtronic Virtuoso II VR ICD  . INGUINAL HERNIA REPAIR Right 2000's X 2  . LAPAROSCOPIC RIGHT HEMI COLECTOMY N/A 11/04/2012   Procedure: LAPAROSCOPIC RIGHT HEMI COLECTOMY;  Surgeon: Pedro Earls, MD;  Location: [facility] ORS;  Service: General;  Laterality: N/A;  . TONSILLECTOMY  ~ 1950    Current Outpatient Prescriptions  Medication Sig Dispense Refill  . acetaminophen (TYLENOL) 500 MG tablet Take 500-1,000 mg by mouth daily as needed for moderate pain or headache.    . carvedilol (COREG) 25 MG tablet Take 1 tablet (25 mg total) by mouth at bedtime. 90 tablet 1  . furosemide (LASIX) 40 MG tablet Take 1 tablet (40 mg total) by mouth daily. 90 tablet 1  . isosorbide mononitrate (IMDUR) 30 MG 24 hr tablet TAKE 1 TABLET BY MOUTH  DAILY 90 tablet 3  . losartan (COZAAR) 50 MG tablet Take 1 tablet (50 mg total) by mouth daily. 90 tablet 1  . meclizine (ANTIVERT) 25 MG tablet Take 1 tablet (25 mg total) by mouth daily as needed for dizziness. 30 tablet 1  . nitroGLYCERIN (NITROSTAT) 0.4 MG SL tablet DISSOLVE 1 TABLET UNDER THE TONGUE EVERY 5 MINUTES AS  NEEDED FOR CHEST PAIN UP TO 3 DOSES 25 tablet 0  . pantoprazole (PROTONIX) 40 MG tablet Take 1 tablet (40 mg total) by mouth daily. 90 tablet 3  . pravastatin (PRAVACHOL) 20 MG tablet Take 1 tablet (20 mg total) by mouth at bedtime. 90 tablet 3  . PRESCRIPTION MEDICATION 1 each every 5 (five) weeks. Eye Injection every 5 weeks.    . rivaroxaban (XARELTO) 20 MG TABS tablet Take 1 tablet (20 mg total) by mouth daily with supper. 90 tablet 3   No current facility-administered medications for this encounter.     Allergies  Allergen Reactions  . Latex Rash  . Tape Rash    USE PAPER    Social History   Social History  . Marital status: Widowed    Spouse name: N/A  . Number of children: 3  . Years of education: N/A   Occupational History  . self  employed    Social History Main Topics  . Smoking status: Never Smoker  . Smokeless tobacco: Never Used  . Alcohol use 4.2 oz/week    7 Cans of beer per week  . Drug use: No  . Sexual activity: Yes   Other Topics Concern  . Not on file   Social History Narrative   Full time. Married.     Family History  Problem Relation Age of Onset  . Hypertension Father   . Hyperlipidemia Father   . Heart disease Father   . Prostate cancer Father   . Alzheimer's disease Mother   . Hypertension Sister   . Hyperlipidemia Sister   . Colon cancer Neg Hx   . Esophageal cancer Neg Hx   . Rectal cancer Neg Hx   .  Stomach cancer Neg Hx     ROS- All systems are reviewed and negative except as per the HPI above  Physical Exam: Vitals:   06/15/16 1103  BP: 140/72  Pulse: 64  Weight: 163 lb (73.9 kg)  Height: 6' (1.829 m)   Wt Readings from Last 3 Encounters:  06/15/16 163 lb (73.9 kg)  05/26/16 168 lb (76.2 kg)  05/25/16 163 lb (73.9 kg)    Labs: Lab Results  Component Value Date   NA 143 05/11/2016   K 4.0 05/11/2016   CL 103 05/11/2016   CO2 24 05/11/2016   GLUCOSE 118 (H) 05/11/2016   BUN 13 05/11/2016   CREATININE 1.25 05/11/2016   CALCIUM 9.0 05/11/2016   Lab Results  Component Value Date   INR 1.05 09/20/2014   Lab Results  Component Value Date   CHOL 177 01/10/2016   HDL 53.10 01/10/2016   LDLCALC 107 (H) 01/10/2016   TRIG 88.0 01/10/2016     GEN- The patient is well appearing, alert and oriented x 3 today.   Head- normocephalic, atraumatic Eyes-  Sclera clear, conjunctiva pink Ears- hearing intact Oropharynx- clear Neck- supple, no JVP Lymph- no cervical lymphadenopathy Lungs- Clear to ausculation bilaterally, normal work of breathing Heart- Regular rate and rhythm, no murmurs, rubs or gallops, PMI not laterally displaced GI- soft, NT, ND, + BS Extremities- no clubbing, cyanosis, or edema MS- no significant deformity or atrophy Skin- no rash or  lesion Psych- euthymic mood, full affect Neuro- strength and sensation are intact  EKG- SR with LBBB at 64 bpm, qrs int 170 ms, qtc 486 ms Epic records reviewed Holter monitor reviewed    Assessment and Plan: 1. Afib Successful cardioverion  2/23 URI symptoms resolved with extra lasix No further afib  Continue xarelto 20 mg a day with CHA2DS2VASc score of at least 5 Continue carvedilol  2. CHF Resolved Continue diuretic Avoid salt Daily weights   f/u with Dr. Caryl Comes as scheduled afib clinic as needed  [place]. Nozomi Mettler, [facility] [address] [place], [place] [address] [phone]

## 2016-07-20 DIAGNOSIS — H353221 Exudative age-related macular degeneration, left eye, with active choroidal neovascularization: Secondary | ICD-10-CM | POA: Diagnosis not present

## 2016-08-12 ENCOUNTER — Other Ambulatory Visit: Payer: Self-pay | Admitting: Internal Medicine

## 2016-08-25 ENCOUNTER — Ambulatory Visit (INDEPENDENT_AMBULATORY_CARE_PROVIDER_SITE_OTHER): Payer: Medicare Other | Admitting: *Deleted

## 2016-08-25 DIAGNOSIS — I255 Ischemic cardiomyopathy: Secondary | ICD-10-CM | POA: Diagnosis not present

## 2016-08-25 NOTE — Progress Notes (Signed)
Remote ICD transmission.   

## 2016-08-26 LAB — CUP PACEART REMOTE DEVICE CHECK
Battery Voltage: 2.87 V
Brady Statistic RV Percent Paced: 0.56 %
Date Time Interrogation Session: 20180529073627
HighPow Impedance: 41 Ohm
HighPow Impedance: 53 Ohm
Implantable Lead Implant Date: 20041020
Implantable Lead Implant Date: 20100913
Implantable Lead Location: 753860
Implantable Lead Location: 753860
Implantable Lead Model: 5076
Implantable Lead Model: 6949
Implantable Pulse Generator Implant Date: 20100913
Lead Channel Impedance Value: 475 Ohm
Lead Channel Sensing Intrinsic Amplitude: 31.625 mV
Lead Channel Sensing Intrinsic Amplitude: 31.625 mV
Lead Channel Setting Pacing Amplitude: 2.5 V
Lead Channel Setting Pacing Pulse Width: 0.8 ms
Lead Channel Setting Sensing Sensitivity: 0.45 mV

## 2016-08-28 ENCOUNTER — Encounter: Payer: Self-pay | Admitting: Cardiology

## 2016-08-31 DIAGNOSIS — H35722 Serous detachment of retinal pigment epithelium, left eye: Secondary | ICD-10-CM | POA: Diagnosis not present

## 2016-08-31 DIAGNOSIS — H353221 Exudative age-related macular degeneration, left eye, with active choroidal neovascularization: Secondary | ICD-10-CM | POA: Diagnosis not present

## 2016-09-25 ENCOUNTER — Other Ambulatory Visit (INDEPENDENT_AMBULATORY_CARE_PROVIDER_SITE_OTHER): Payer: Medicare Other

## 2016-09-25 ENCOUNTER — Ambulatory Visit (INDEPENDENT_AMBULATORY_CARE_PROVIDER_SITE_OTHER)
Admission: RE | Admit: 2016-09-25 | Discharge: 2016-09-25 | Disposition: A | Discharge status: Home / Self Care | Payer: Medicare Other | Source: Ambulatory Visit | Attending: Nurse Practitioner | Admitting: Nurse Practitioner

## 2016-09-25 ENCOUNTER — Encounter: Payer: Self-pay | Admitting: Nurse Practitioner

## 2016-09-25 ENCOUNTER — Ambulatory Visit (INDEPENDENT_AMBULATORY_CARE_PROVIDER_SITE_OTHER): Payer: Medicare Other | Admitting: Nurse Practitioner

## 2016-09-25 VITALS — BP 140/80 | HR 71 | Temp 97.7°F | Ht 72.0 in | Wt 165.0 lb

## 2016-09-25 DIAGNOSIS — R0600 Dyspnea, unspecified: Secondary | ICD-10-CM

## 2016-09-25 DIAGNOSIS — R5383 Other fatigue: Secondary | ICD-10-CM | POA: Diagnosis not present

## 2016-09-25 DIAGNOSIS — I5023 Acute on chronic systolic (congestive) heart failure: Secondary | ICD-10-CM

## 2016-09-25 DIAGNOSIS — R059 Cough, unspecified: Secondary | ICD-10-CM

## 2016-09-25 DIAGNOSIS — R05 Cough: Secondary | ICD-10-CM

## 2016-09-25 DIAGNOSIS — R0602 Shortness of breath: Secondary | ICD-10-CM | POA: Diagnosis not present

## 2016-09-25 DIAGNOSIS — R0609 Other forms of dyspnea: Secondary | ICD-10-CM

## 2016-09-25 LAB — BASIC METABOLIC PANEL
BUN: 11 mg/dL (ref 6–23)
CO2: 27 mEq/L (ref 19–32)
Calcium: 9.4 mg/dL (ref 8.4–10.5)
Chloride: 107 mEq/L (ref 96–112)
Creatinine, Ser: 1.11 mg/dL (ref 0.40–1.50)
GFR: 68.49 mL/min (ref >60.00)
Glucose, Bld: 118 mg/dL — ABNORMAL HIGH (ref 70–99)
Potassium: 3.9 mEq/L (ref 3.5–5.1)
Sodium: 142 mEq/L (ref 135–145)

## 2016-09-25 LAB — BRAIN NATRIURETIC PEPTIDE: Pro B Natriuretic peptide (BNP): 1553 pg/mL — ABNORMAL HIGH (ref 0.0–100.0)

## 2016-09-25 MED ORDER — FUROSEMIDE 40 MG PO TABS: 40.0000 mg | ORAL_TABLET | Freq: Two times a day (BID) | ORAL | 0 refills | Status: DC

## 2016-09-25 MED ORDER — POTASSIUM CHLORIDE CRYS ER 20 MEQ PO TBCR: 20.0000 meq | EXTENDED_RELEASE_TABLET | Freq: Every day | ORAL | 0 refills | Status: DC

## 2016-09-25 NOTE — Patient Instructions (Addendum)
Normal BMP. Increase in BNP which indicates CHF exacerbation. No acute finding on CXR. Take furosemide 40mg  BID x 4days with potassium supplement, then return to furosemide 40mg  once a day continuously. Contact cardiology immediately for f/up appt

## 2016-09-25 NOTE — Progress Notes (Signed)
Subjective:  Patient ID: Mark Clayton, male    DOB: [birth year]  Age: 76 y.o. MRN: [id number]  CC: Fatigue (weakness,no energy for 2 wks. hx of fluid in lungs before/handicap application?)   Shortness of Breath  This is a new problem. The current episode started 1 to 4 weeks ago (worse in last 2weeks). The problem occurs constantly. The problem has been gradually worsening. Pertinent negatives include no abdominal pain, chest pain, claudication, fever, headaches, hemoptysis, leg swelling, neck pain, orthopnea, PND, sore throat, sputum production, syncope, vomiting or wheezing. The symptoms are aggravated by any activity (squatting or bending forward). He has tried nothing for the symptoms. His past medical history is significant for CAD and a heart failure. There is no history of allergies, asthma, chronic lung disease, COPD, DVT or a recent surgery.   Increased SOB and fatigue x 2weeks. Unable to bend forward or squat without feeling exhausted and SOB.  Outpatient Medications Prior to Visit  Medication Sig Dispense Refill  . acetaminophen (TYLENOL) 500 MG tablet Take 500-1,000 mg by mouth daily as needed for moderate pain or headache.    . carvedilol (COREG) 25 MG tablet TAKE 1 TABLET BY MOUTH AT  BEDTIME 90 tablet 1  . furosemide (LASIX) 40 MG tablet TAKE 1 TABLET BY MOUTH  DAILY 90 tablet 1  . isosorbide mononitrate (IMDUR) 30 MG 24 hr tablet TAKE 1 TABLET BY MOUTH  DAILY 90 tablet 3  . losartan (COZAAR) 50 MG tablet TAKE 1 TABLET BY MOUTH  DAILY 90 tablet 1  . meclizine (ANTIVERT) 25 MG tablet Take 1 tablet (25 mg total) by mouth daily as needed for dizziness. 30 tablet 1  . nitroGLYCERIN (NITROSTAT) 0.4 MG SL tablet DISSOLVE 1 TABLET UNDER THE TONGUE EVERY 5 MINUTES AS  NEEDED FOR CHEST PAIN UP TO 3 DOSES 25 tablet 0  . pantoprazole (PROTONIX) 40 MG tablet Take 1 tablet (40 mg total) by mouth daily. 90 tablet 3  . pravastatin (PRAVACHOL) 20 MG tablet Take 1 tablet (20 mg total) by mouth  at bedtime. 90 tablet 3  . PRESCRIPTION MEDICATION 1 each every 5 (five) weeks. Eye Injection every 5 weeks.    . rivaroxaban (XARELTO) 20 MG TABS tablet Take 1 tablet (20 mg total) by mouth daily with supper. 90 tablet 3   No facility-administered medications prior to visit.     ROS Review of Systems  Constitutional: Positive for malaise/fatigue. Negative for chills, diaphoresis and fever.  HENT: Negative for congestion, sinus pain and sore throat.   Respiratory: Positive for cough and shortness of breath. Negative for hemoptysis, sputum production and wheezing.   Cardiovascular: Negative for chest pain, palpitations, orthopnea, claudication, leg swelling, syncope and PND.  Gastrointestinal: Negative for abdominal pain, blood in stool, constipation, diarrhea, melena, nausea and vomiting.  Genitourinary: Negative for dysuria, flank pain, frequency, hematuria and urgency.  Musculoskeletal: Negative for falls, joint pain, myalgias and neck pain.  Skin: Negative.   Neurological: Positive for weakness. Negative for dizziness, focal weakness, loss of consciousness and headaches.  Psychiatric/Behavioral: Negative for depression. The patient is not nervous/anxious.     ECG: sinus rhythm with PVCs and 1st AVB, no change compared to previous ECG (04/2016).  Objective:  BP 140/80   Pulse 71   Temp 97.7 F (36.5 C)   Ht 6' (1.829 m)   Wt 165 lb (74.8 kg)   SpO2 99%   BMI 22.38 kg/m   BP Readings from Last 3 Encounters:  09/25/16 140/80  06/15/16 140/72  05/26/16 138/70    Wt Readings from Last 3 Encounters:  09/25/16 165 lb (74.8 kg)  06/15/16 163 lb (73.9 kg)  05/26/16 168 lb (76.2 kg)    Physical Exam  Constitutional: He is oriented to person, place, and time. No distress.  Neck: Normal range of motion. Neck supple. JVD present.  Cardiovascular: Normal rate, regular rhythm and normal heart sounds.  Exam reveals no gallop and no friction rub.   No murmur  heard. Pulmonary/Chest: Effort normal and breath sounds normal. No respiratory distress. He has no wheezes. He has no rales.  Abdominal: Soft. Bowel sounds are normal.  Musculoskeletal: He exhibits no edema.  Neurological: He is alert and oriented to person, place, and time.  Skin: Skin is warm and dry.  Psychiatric: He has a normal mood and affect. His behavior is normal.  Vitals reviewed.   Lab Results  Component Value Date   WBC 7.5 05/26/2016   HGB 11.4 (L) 05/26/2016   HCT 34.7 (L) 05/26/2016   PLT 187.0 05/26/2016   GLUCOSE 118 (H) 09/25/2016   CHOL 177 01/10/2016   TRIG 88.0 01/10/2016   HDL 53.10 01/10/2016   LDLCALC 107 (H) 01/10/2016   ALT 9 01/10/2016   AST 15 01/10/2016   NA 142 09/25/2016   K 3.9 09/25/2016   CL 107 09/25/2016   CREATININE 1.11 09/25/2016   BUN 11 09/25/2016   CO2 27 09/25/2016   INR 1.05 09/20/2014    No results found.  Assessment & Plan:   Mark Clayton was seen today for fatigue.  Diagnoses and all orders for this visit:  Acute on chronic systolic congestive heart failure (HCC) -     furosemide (LASIX) 40 MG tablet; Take 1 tablet (40 mg total) by mouth 2 (two) times daily. -     potassium chloride SA (K-DUR,KLOR-CON) 20 MEQ tablet; Take 1 tablet (20 mEq total) by mouth daily.  Dyspnea on exertion -     EKG 12-Lead -     B Nat Peptide; Future -     Basic metabolic panel; Future -     DG Chest 2 View; Future -     furosemide (LASIX) 40 MG tablet; Take 1 tablet (40 mg total) by mouth 2 (two) times daily. -     potassium chloride SA (K-DUR,KLOR-CON) 20 MEQ tablet; Take 1 tablet (20 mEq total) by mouth daily.  Fatigue, unspecified type -     EKG 12-Lead -     B Nat Peptide; Future -     Basic metabolic panel; Future -     DG Chest 2 View; Future -     furosemide (LASIX) 40 MG tablet; Take 1 tablet (40 mg total) by mouth 2 (two) times daily. -     potassium chloride SA (K-DUR,KLOR-CON) 20 MEQ tablet; Take 1 tablet (20 mEq total) by mouth  daily.  Cough -     EKG 12-Lead -     B Nat Peptide; Future -     Basic metabolic panel; Future -     DG Chest 2 View; Future -     furosemide (LASIX) 40 MG tablet; Take 1 tablet (40 mg total) by mouth 2 (two) times daily. -     potassium chloride SA (K-DUR,KLOR-CON) 20 MEQ tablet; Take 1 tablet (20 mEq total) by mouth daily.   I am having Mark Clayton start on furosemide and potassium chloride SA. I am also having him maintain his acetaminophen, PRESCRIPTION MEDICATION, pantoprazole,  meclizine, pravastatin, rivaroxaban, isosorbide mononitrate, losartan, carvedilol, nitroGLYCERIN, and furosemide.  Meds ordered this encounter  Medications  . furosemide (LASIX) 40 MG tablet    Sig: Take 1 tablet (40 mg total) by mouth 2 (two) times daily.    Dispense:  6 tablet    Refill:  0    Order Specific Question:   Supervising Provider    Answer:   Cassandria Anger [[id number]]  . potassium chloride SA (K-DUR,KLOR-CON) 20 MEQ tablet    Sig: Take 1 tablet (20 mEq total) by mouth daily.    Dispense:  4 tablet    Refill:  0    Order Specific Question:   Supervising Provider    Answer:   Cassandria Anger [[id number]]    Follow-up: Return if symptoms worsen or fail to improve.  Wilfred Lacy, NP

## 2016-09-28 ENCOUNTER — Telehealth: Payer: Self-pay | Admitting: Internal Medicine

## 2016-09-28 NOTE — Telephone Encounter (Signed)
Called pt to make appt this week for CHF exacerbation, pt's CHF is not EP related as call came to me. Pt see's [facility] for general cardiology, he is out this week. Set pt up with PA Almyra Deforest for Friday July 6th at 11;30am, pt aware and to call if increased SOB or weeping from leg Edema.

## 2016-09-28 NOTE — Telephone Encounter (Signed)
Patient placed call to office.  Operator contacted this nurse.  Pt calling with concern about 1500 ml fluid around his heart.   Reviewed chest xray, lab work and physician note from 09/25/2016.  Pt with increased BNP to 1500.  Xray negative for fluid around his heart.  Physician visit last Friday, Pt lasix increased.  Instructed operator to set up appt with patient cardiologist or APP this week if possible.  If patient SOB does not improve or gets worse please call office.

## 2016-10-02 ENCOUNTER — Ambulatory Visit (INDEPENDENT_AMBULATORY_CARE_PROVIDER_SITE_OTHER): Payer: Medicare Other | Admitting: Physician Assistant

## 2016-10-02 ENCOUNTER — Telehealth: Payer: Self-pay | Admitting: Physician Assistant

## 2016-10-02 ENCOUNTER — Encounter: Payer: Self-pay | Admitting: Physician Assistant

## 2016-10-02 VITALS — BP 127/68 | HR 58 | Ht 72.0 in | Wt 159.0 lb

## 2016-10-02 DIAGNOSIS — Z95810 Presence of automatic (implantable) cardiac defibrillator: Secondary | ICD-10-CM

## 2016-10-02 DIAGNOSIS — I5022 Chronic systolic (congestive) heart failure: Secondary | ICD-10-CM | POA: Diagnosis not present

## 2016-10-02 DIAGNOSIS — Z79899 Other long term (current) drug therapy: Secondary | ICD-10-CM | POA: Diagnosis not present

## 2016-10-02 DIAGNOSIS — E785 Hyperlipidemia, unspecified: Secondary | ICD-10-CM

## 2016-10-02 DIAGNOSIS — I25810 Atherosclerosis of coronary artery bypass graft(s) without angina pectoris: Secondary | ICD-10-CM

## 2016-10-02 DIAGNOSIS — I10 Essential (primary) hypertension: Secondary | ICD-10-CM

## 2016-10-02 DIAGNOSIS — I255 Ischemic cardiomyopathy: Secondary | ICD-10-CM | POA: Diagnosis not present

## 2016-10-02 DIAGNOSIS — I480 Paroxysmal atrial fibrillation: Secondary | ICD-10-CM

## 2016-10-02 MED ORDER — SACUBITRIL-VALSARTAN 24-26 MG PO TABS: 1.0000 | ORAL_TABLET | Freq: Two times a day (BID) | ORAL | 1 refills | Status: DC

## 2016-10-02 NOTE — Patient Instructions (Addendum)
Your physician has recommended you make the following change in your medication:  - CHANGE carvedilol 12.5mg  twice daily (cut 25mg  tablets in half) - STOP losartan  - START entrestro 24-26mg  twice daily (use free 30 day card) - KEEP TAKING lasix 40mg  daily  LABS today and in [place] (BMET)  Your physician recommends that you schedule a follow-up appointment in: [place] with Isaac Laud, [place]  Your physician recommends that you schedule a follow-up appointment in: 3 months with Dr. Stanford Breed

## 2016-10-02 NOTE — Telephone Encounter (Signed)
Mark Clayton is calling because he was given a paper to take to the pharmacy by  Eulas Post and he does not have it and is asking can he get another copy of it . Please call

## 2016-10-02 NOTE — Progress Notes (Signed)
Cardiology Office Note    Date:  10/03/2016   ID:  Mark, Clayton 12-Sep-1940, MRN [id number]  PCP:  [name], MD  Cardiologist: Plan to set up with Dr. Stanford Breed Electrophysiologist:  Dr. Caryl Comes   Chief Complaint  Patient presents with  . Appointment    swelling, SHOB, tiredness, and discomfort has got better since losing fluid per patient. no other complaints.    History of Present Illness:  Mark Clayton is a 76 y.o. male with PMH of CAD s/p CABG 2003, recent DES to SVG to RPDA-RPLB, ICM with EF 20% s/p Medtronic ICD with RV pacing lead implanted 2010 (primary prevention), anemia, chronic systolic heart failure, HTN, colon CA s/p colectomy, PAF on Xarelto and HLD. Last cardiac catheterization performed on 09/20/2014 following abnormal myoview showed native 3 vessel disease with 90% RCA, patent LIMA to LAD and SVG to OM 2/OM 3, patent SVG to RPDA but its sequential graft to RPLB were occluded. The native RCA had a 90% stenosis, this was treated with drug-eluting stent. On further review of the chart, he was seen by Kennon Portela for possible angina symptom in 05/02/2015, he was placed on Imdur with consideration of repeat cardiac cath if symptom persisted, it does not appears he went back for follow-up. His wife passed away in 09-25-2015. He had a history of atrial fibrillation and underwent cardioversion on 05/22/2016, he has been compliant with Xarelto.  In the recent weeks, he has been having more shortness of breath. He was placed on 40 mg BID Lasix with improvement of his symptom. He was supposed to continue 40 mg twice a day of Lasix for 4 days then return to 40 mg daily thereafter, he actually continued the higher dose for 6 days and plan to restart to 40 mg daily today. On physical exam, he does not have any sign of volume overload, there was no lower extremity edema, his lung is clear, no significant JVD on exam. I did check with him to see if he had any chest discomfort  or any of the previous angina symptom, he denies any recently. Given chronically low ejection fraction of 20%, I did not pursue a repeat echocardiogram at this point. He has chronic dyspnea on exertion. After the higher dose of diuretic, his symptoms have improved significantly. He is no longer volume overloaded at this point. Given lack of chest discomfort, I will hold off on ischemic workup at this time unless symptoms persistent.  His primary electrophysiologist is Dr. Virl Axe, according to telephone note, he sees Dr. Stanford Breed for general cardiology issue. However I am unable to confirm this (I did see Dr. Stanford Breed performed cardioversion on him in 04/2016), patient does not remember Dr. Stanford Breed either. He did see Dr. Haroldine Laws in distant past, however he says he fired Dr. Haroldine Laws. Given significant h/o CAD, I will set him up with Dr. Stanford Breed as primary cardiologist.   Past Medical History:  Diagnosis Date  . AICD (automatic cardioverter/defibrillator) present 01/17/2003   Medtronic Maximo 7232CX ICD, serial [id number]  . Anemia 02-06-11   takes oral iron  . Arthritis    hands, knees  . CAD (coronary artery disease) 2003   a. h/o MI and CABG in 2003. b. s/p DES to SVG-RPDA-RPLB in 08/2014.  Marland Kitchen Cancer of sigmoid colon ([place]) 2012   a. s/p colon surgery.  . Carotid bruit   . Chronic systolic CHF (congestive heart failure) (HCC)    a. EF 20% in 2014.  Marland Kitchen  Cough   . GERD (gastroesophageal reflux disease) 02-06-11  . HTN (hypertension)   . Hyperlipidemia   . Ischemic cardiomyopathy    a. EF 20% in 2014. (Master study EF >20%)  . LV (left ventricular) mural thrombus    a. Last echo 12/2012 EF 20% with mural thrombus, mod-severe left atrial enlargement. Per Dr. Olin Pia notes from that time, echo in 2009 demonstrated something similar - he had been on warfarin following prior MI and this was stopped after a number of months. Given lack of data regarding anticoagulation from chronic clot, he  has not been on anticoagulation since.  . Myocardial infarct [facility])    2003    Past Surgical History:  Procedure Laterality Date  . CARDIAC CATHETERIZATION N/A 09/20/2014   Procedure: Left Heart Cath and Coronary Angiography;  Surgeon: Jettie Booze, MD; LAD 95%, D1 100%, CFX liner percent, OM 200%, OM 390%, RCA 90%, LIMA-LAD okay, SVG-OM 2-OM 3 minimal disease, SVG-RPDA-RPLB 100% between the RPDA and RPL     . CARDIAC CATHETERIZATION N/A 09/20/2014   Procedure: Coronary Stent Intervention;  Surgeon: Jettie Booze, MD; Synergy DES 4 x 24 mm reducing the stenosis to 5%   . CARDIAC DEFIBRILLATOR PLACEMENT  01/17/03   6949 lead. Medtronic. remote-no; with later revision  . CARDIOVERSION N/A 05/22/2016   Procedure: CARDIOVERSION;  Surgeon: Lelon Perla, MD;  Location[facility] ENDOSCOPY;  Service: Cardiovascular;  Laterality: N/A;  . CATARACT EXTRACTION W/ INTRAOCULAR LENS  IMPLANT, BILATERAL Bilateral June/-July 2009   Dr. Katy Fitch  . COLON RESECTION  02/09/2011   Procedure: LAPAROSCOPIC SIGMOID COLON RESECTION;  Surgeon: Pedro Earls, MD;  Location: [facility] ORS;  Service: General;  Laterality: N/A;  Laparoscopic Assisted Sigmoid Colectomy  . COLON SURGERY    . COLONOSCOPY  08/31/2011   Procedure: COLONOSCOPY;  Surgeon: Jerene Bears, MD;  Location: [facility] ENDOSCOPY;  Service: Gastroenterology;  Laterality: N/A;  . COLONOSCOPY N/A 09/05/2012   Procedure: COLONOSCOPY;  Surgeon: Jerene Bears, MD;  Location: [facility] ENDOSCOPY;  Service: Gastroenterology;  Laterality: N/A;  . COLONOSCOPY N/A 04/18/2013   Procedure: COLONOSCOPY;  Surgeon: Jerene Bears, MD;  Location: [facility] ENDOSCOPY;  Service: Gastroenterology;  Laterality: N/A;  . COLONOSCOPY N/A 04/09/2014   Procedure: COLONOSCOPY;  Surgeon: Jerene Bears, MD;  Location: [facility] ENDOSCOPY;  Service: Gastroenterology;  Laterality: N/A;  . CORONARY ANGIOPLASTY    . CORONARY ARTERY BYPASS GRAFT  01/2002   LIMA-LAD, SVG-OM 2-OM 3, SVG-RPDA-RPLB  . ICD GENERATOR CHANGE   2010   Medtronic Virtuoso II VR ICD  . INGUINAL HERNIA REPAIR Right 2000's X 2  . LAPAROSCOPIC RIGHT HEMI COLECTOMY N/A 11/04/2012   Procedure: LAPAROSCOPIC RIGHT HEMI COLECTOMY;  Surgeon: Pedro Earls, MD;  Location: [facility] ORS;  Service: General;  Laterality: N/A;  . TONSILLECTOMY  ~ 1950    Current Medications: Outpatient Medications Prior to Visit  Medication Sig Dispense Refill  . acetaminophen (TYLENOL) 500 MG tablet Take 500-1,000 mg by mouth daily as needed for moderate pain or headache.    . isosorbide mononitrate (IMDUR) 30 MG 24 hr tablet TAKE 1 TABLET BY MOUTH  DAILY 90 tablet 3  . meclizine (ANTIVERT) 25 MG tablet Take 1 tablet (25 mg total) by mouth daily as needed for dizziness. 30 tablet 1  . nitroGLYCERIN (NITROSTAT) 0.4 MG SL tablet DISSOLVE 1 TABLET UNDER THE TONGUE EVERY 5 MINUTES AS  NEEDED FOR CHEST PAIN UP TO 3 DOSES 25 tablet 0  . pantoprazole (PROTONIX) 40  MG tablet Take 1 tablet (40 mg total) by mouth daily. 90 tablet 3  . pravastatin (PRAVACHOL) 20 MG tablet Take 1 tablet (20 mg total) by mouth at bedtime. 90 tablet 3  . PRESCRIPTION MEDICATION 1 each every 5 (five) weeks. Eye Injection every 5 weeks.    . rivaroxaban (XARELTO) 20 MG TABS tablet Take 1 tablet (20 mg total) by mouth daily with supper. 90 tablet 3  . carvedilol (COREG) 25 MG tablet TAKE 1 TABLET BY MOUTH AT  BEDTIME 90 tablet 1  . furosemide (LASIX) 40 MG tablet Take 1 tablet (40 mg total) by mouth 2 (two) times daily. (Patient taking differently: Take 40 mg by mouth daily. ) 6 tablet 0  . losartan (COZAAR) 50 MG tablet TAKE 1 TABLET BY MOUTH  DAILY 90 tablet 1  . potassium chloride SA (K-DUR,KLOR-CON) 20 MEQ tablet Take 1 tablet (20 mEq total) by mouth daily. 4 tablet 0  . furosemide (LASIX) 40 MG tablet TAKE 1 TABLET BY MOUTH  DAILY (Patient not taking: Reported on 10/02/2016) 90 tablet 1   No facility-administered medications prior to visit.      Allergies:   Latex and Tape   Social History    Social History  . Marital status: Widowed    Spouse name: N/A  . Number of children: 3  . Years of education: N/A   Occupational History  . self employed    Social History Main Topics  . Smoking status: Never Smoker  . Smokeless tobacco: Never Used  . Alcohol use 4.2 oz/week    7 Cans of beer per week  . Drug use: No  . Sexual activity: Yes   Other Topics Concern  . None   Social History Narrative   Full time. Married.      Family History:  The patient's family history includes Alzheimer's disease in his mother; Heart disease in his father; Hyperlipidemia in his father and sister; Hypertension in his father and sister; Prostate cancer in his father.   ROS:   Please see the history of present illness.    ROS All other systems reviewed and are negative.   PHYSICAL EXAM:   VS:  BP 127/68   Pulse (!) 58   Ht 6' (1.829 m)   Wt 159 lb (72.1 kg)   SpO2 98%   BMI 21.56 kg/m    GEN: Well nourished, well developed, in no acute distress  HEENT: normal  Neck: no JVD, carotid bruits, or masses Cardiac: RRR; no murmurs, rubs, or gallops,no edema  Respiratory:  clear to auscultation bilaterally, normal work of breathing GI: soft, nontender, nondistended, + BS MS: no deformity or atrophy  Skin: warm and dry, no rash Neuro:  Alert and Oriented x 3, Strength and sensation are intact Psych: euthymic mood, full affect  Wt Readings from Last 3 Encounters:  10/02/16 159 lb (72.1 kg)  09/25/16 165 lb (74.8 kg)  06/15/16 163 lb (73.9 kg)      Studies/Labs Reviewed:   EKG:  EKG is ordered today.  The ekg ordered today demonstrates Sinus rhythm with left bundle branch block.   Recent Labs: 01/10/2016: ALT 9 05/26/2016: Hemoglobin 11.4; Platelets 187.0 09/25/2016: Pro B Natriuretic peptide (BNP) 1,553.0 10/02/2016: BUN 18; Creatinine, Ser 1.12; Potassium 4.6; Sodium 144   Lipid Panel    Component Value Date/Time   CHOL 177 01/10/2016 0907   TRIG 88.0 01/10/2016 0907   HDL  53.10 01/10/2016 0907   CHOLHDL 3 01/10/2016 7846  VLDL 17.6 01/10/2016 0907   LDLCALC 107 (H) 01/10/2016 0907    Additional studies/ records that were reviewed today include:   Echo 01/27/2013 LV EF: 20%  Study Conclusions  - Left ventricle: The cavity size was mildly dilated. Wall thickness was increased in a pattern of mild LVH. Systolic function was severely reduced. The estimated ejection fraction was 20%. Microbubble contrast was employed for better endocardial definition. There is a well-defined mural thrombus, approximately 1 x 1.5 cm, at the apical inferior septal wall. There is akinesis of the mid-distalinferolateral and inferior myocardium. There is akinesis of the mid-distalanteroseptal and apical myocardium. Consistent with ischemic cardiomyopathy. Doppler parameters are consistent with restrictive physiology, indicative of decreased left ventricular diastolic compliance and/or increased left atrial pressure. - Aortic valve: Mildly calcified annulus. Trileaflet; mildly thickened leaflets. No significant regurgitation. - Mitral valve: Calcified annulus. Mildly thickened leaflets . Mild regurgitation. - Left atrium: The atrium was moderately to severely dilated. - Right ventricle: Pacer wire or catheter noted in right ventricle. Systolic function was moderately reduced. - Right atrium: Central venous pressure: 56mm Hg (est). - Tricuspid valve: Mild regurgitation. - Pulmonary arteries: Systolic pressure could not be accurately estimated. - Pericardium, extracardiac: There was no pericardial effusion. Impressions:  - Comparison to prior study November 2011. Mild LVH with mild chamber dilatation and LVEF approximately 20% - reduced from prior assessment. Wall motion abnormalities as described above consistent with ischemic cardiomyopathy. There is evidence of a mural thrombus noted at the apical inferior septal  wall measuring approximately 1 x 1.5 cm, well-defined by micro-bubble contrast. Diastolic filling pattern is restrictive with increased pressures. Moderate to severe left atrial enlargement. Mildly thickened mitral leaflets with mild mitral regurgitation. Sclerotic aortic valve without stenosis. Device wire noted within the right heart. RVcontraction is moderately reduced. Mild tricuspid regurgitation. Unable to assess PASP. CVP estimated at 8 mm mercury.   Myoview 08/31/2014 Study Highlights   Abnormal study. Severe dilatation of the ventricle with diffuse hypokinesis. Ejection fraction is 20%. The study suggests a large area of scar with slight peri-infarct ischemia. This is a high risk scan    Cath 09/20/2014 Conclusion    Severe native three-vessel coronary artery disease. Patent LIMA to LAD. Patent jump graft SVG to OM 2 and OM 3. Patent SVG to PDA. The second portion of this graft which goes to the posterior lateral artery is occluded.  90% distal RCA stenosis which supplies the posterior lateral territory. Successful 4.0 x 24 Synergy drug-eluting stent placement to the distal right coronary artery, postdilated to 5 mm in diameter..   Continue dual antiplatelets therapy for at least a year. Continue aggressive secondary prevention. He'll be watched overnight. Possible discharge tomorrow.      ASSESSMENT:    1. Chronic systolic heart failure (HCC)   2. Cardiomyopathy, ischemic   3. Medication management   4. Coronary artery disease involving coronary bypass graft of native heart without angina pectoris   5. ICD (implantable cardioverter-defibrillator) in place   6. Essential hypertension   7. Hyperlipidemia, unspecified hyperlipidemia type   8. PAF (paroxysmal atrial fibrillation) (HCC)      PLAN:  In order of problems listed above:  1. Chronic systolic heart failure: Had 40 mg twice a day of Lasix for the past 6 days, transitioning to 40 mg daily of  Lasix. He does not appear to be volume overloaded on physical exam. I think he is euvolemic at this point. Since his ejection fraction has been chronically low in the  20% range, I did not attempt to obtain another repeat echocardiogram. I have switched his losartan to [facility], he will need risk metabolic panel today and also in one week.  2. CAD s/p CABG: Last PCI was in 2016 following abnormal Myoview. He was seen by Kennon Portela PA-C in February 2017 with possible angina symptom, he was placed on Imdur with consideration of repeat cath if he fails medical therapy. He did not go back for visit. However he also had did not have any recurrent chest discomfort either after placed on Imdur.  3. ICM s/p ICD: followed by Dr. Caryl Comes  4. PAF on Xarelto: Maintaining sinus rhythm on today's EKG, underwent cardioversion in February 2018. Upcoming visit with atrial fibrillation clinic.   5. HTN: Blood pressure stable. Currently on carvedilol and losartan, given persistent low ejection fraction, I want to at least try Entresto which has valsartan and secubitril. Since he is pain back on the 40 mg daily of Lasix, with combination of secubitril, I think this would be enough diuretic to keep him dry. Previous Myoview showed a large scar, hopefully with Entresto, we will also see some improvement in the ejection fraction.  6. HLD: On Pravachol 20 mg daily.    Medication Adjustments/Labs and Tests Ordered: Current medicines are reviewed at length with the patient today.  Concerns regarding medicines are outlined above.  Medication changes, Labs and Tests ordered today are listed in the Patient Instructions below. Patient Instructions  Your physician has recommended you make the following change in your medication:  - CHANGE carvedilol 12.5mg  twice daily (cut 25mg  tablets in half) - STOP losartan  - START entrestro 24-26mg  twice daily (use free 30 day card) - KEEP TAKING lasix 40mg  daily  LABS today and in  [place] (BMET)  Your physician recommends that you schedule a follow-up appointment in: [place] with Isaac Laud, [place]  Your physician recommends that you schedule a follow-up appointment in: 3 months with Dr. Stanford Breed        Signed, [place], [place]  10/03/2016 1:24 PM    [address], [place], [place]  [address] Phone: [phone]; Fax: [phone]

## 2016-10-02 NOTE — Telephone Encounter (Signed)
Returned call to patient. He was given an entresto free month card and has misplaced this. Advised I would leave another at the front desk for him to pick up

## 2016-10-03 ENCOUNTER — Encounter: Payer: Self-pay | Admitting: Physician Assistant

## 2016-10-03 LAB — BASIC METABOLIC PANEL
BUN/Creatinine Ratio: 16 (ref 10–24)
BUN: 18 mg/dL (ref 8–27)
CO2: 26 mmol/L (ref 20–29)
Calcium: 10.1 mg/dL (ref 8.6–10.2)
Chloride: 103 mmol/L (ref 96–106)
Creatinine, Ser: 1.12 mg/dL (ref 0.76–1.27)
GFR calc Af Amer: 74 mL/min/{1.73_m2} (ref >59)
GFR calc non Af Amer: 64 mL/min/{1.73_m2} (ref >59)
Glucose: 130 mg/dL — ABNORMAL HIGH (ref 65–99)
Potassium: 4.6 mmol/L (ref 3.5–5.2)
Sodium: 144 mmol/L (ref 134–144)

## 2016-10-03 NOTE — Progress Notes (Signed)
Renal function and electrolyte stable. No obvious contraindication to entresto.

## 2016-10-05 ENCOUNTER — Telehealth: Payer: Self-pay | Admitting: Physician Assistant

## 2016-10-05 MED ORDER — LOSARTAN POTASSIUM 50 MG PO TABS: 50.0000 mg | ORAL_TABLET | Freq: Every day | ORAL | 3 refills | Status: DC

## 2016-10-05 NOTE — Telephone Encounter (Signed)
Pt notified he will restart losartan 50mg  and will call back with anything needed

## 2016-10-05 NOTE — Telephone Encounter (Signed)
S/w pt he states that he started [facility] 10-02-16 and states that he is unable to take because of the side effects, constipation unable to sleep, nausea upon waking and  weakness. He states that he did not take last night and states that he had Bm this morning and nausea has went away. He states that he needs another medication to replace the [facility], please advise

## 2016-10-05 NOTE — Telephone Encounter (Signed)
If that's the case, then D/C entresto and restart previous losartan 50mg  daily.

## 2016-10-05 NOTE — Telephone Encounter (Signed)
Mark Clayton is calling because he is having trouble with his medication . Please call

## 2016-10-09 ENCOUNTER — Telehealth: Payer: Self-pay | Admitting: *Deleted

## 2016-10-09 NOTE — Telephone Encounter (Signed)
PATIENT IN OFFICE TO HAVE BMP DONE. STATES HE DOES NOT THINK HE NEEDS ONE  BUT A BNP.  PATIENT DOES NOT HAVE ANY SHORTNESS OF BREATH OR WEIGHT GAIN  SYMPTOMS CHF AT PRESENT.     REVIEWED WITH DR Stanford Breed-  PATIENT STOPPED ENTRESTO AND NOW TAKING  LOSARTAN 50 MG. PATIENT STATES HE COULD NOT TOLERATE ENTRESTO    NO BMP OR PRO BNP NEED TODAY PER DR CRENSHAW.  DUE TO NO SYMPTOMS AT PRESENT.  WILL MAKE A DECISION IF NEEDED AT NEXT APPT WITH MENG  PATIENT AWARE AND VERBALIZED UNDERSTANDING.   WILL HAVE CHANGE APPOINTMENT - PATIENT WALKED TO CHECK OUT

## 2016-10-12 DIAGNOSIS — H353221 Exudative age-related macular degeneration, left eye, with active choroidal neovascularization: Secondary | ICD-10-CM | POA: Diagnosis not present

## 2016-10-12 DIAGNOSIS — H35362 Drusen (degenerative) of macula, left eye: Secondary | ICD-10-CM | POA: Diagnosis not present

## 2016-10-12 DIAGNOSIS — H353122 Nonexudative age-related macular degeneration, left eye, intermediate dry stage: Secondary | ICD-10-CM | POA: Diagnosis not present

## 2016-10-12 DIAGNOSIS — H35361 Drusen (degenerative) of macula, right eye: Secondary | ICD-10-CM | POA: Diagnosis not present

## 2016-10-12 DIAGNOSIS — H35722 Serous detachment of retinal pigment epithelium, left eye: Secondary | ICD-10-CM | POA: Diagnosis not present

## 2016-10-14 ENCOUNTER — Ambulatory Visit: Payer: Medicare Other | Admitting: Physician Assistant

## 2016-10-30 ENCOUNTER — Ambulatory Visit: Payer: Medicare Other | Admitting: Physician Assistant

## 2016-11-11 ENCOUNTER — Ambulatory Visit (INDEPENDENT_AMBULATORY_CARE_PROVIDER_SITE_OTHER): Payer: Medicare Other | Admitting: Internal Medicine

## 2016-11-11 DIAGNOSIS — I255 Ischemic cardiomyopathy: Secondary | ICD-10-CM | POA: Diagnosis not present

## 2016-11-11 DIAGNOSIS — I5022 Chronic systolic (congestive) heart failure: Secondary | ICD-10-CM

## 2016-11-11 LAB — CUP PACEART INCLINIC DEVICE CHECK
Battery Voltage: 2.83 V
Brady Statistic RV Percent Paced: 0.52 %
Date Time Interrogation Session: 20180815153234
HighPow Impedance: 44 Ohm
HighPow Impedance: 57 Ohm
Implantable Lead Implant Date: 20041020
Implantable Lead Implant Date: 20100913
Implantable Lead Location: 753860
Implantable Lead Location: 753860
Implantable Lead Model: 5076
Implantable Lead Model: 6949
Implantable Pulse Generator Implant Date: 20100913
Lead Channel Impedance Value: 475 Ohm
Lead Channel Pacing Threshold Amplitude: 1.5 V
Lead Channel Pacing Threshold Pulse Width: 0.8 ms
Lead Channel Sensing Intrinsic Amplitude: 31.625 mV
Lead Channel Sensing Intrinsic Amplitude: 31.625 mV
Lead Channel Setting Pacing Amplitude: 3 V
Lead Channel Setting Pacing Pulse Width: 0.8 ms
Lead Channel Setting Sensing Sensitivity: 0.45 mV

## 2016-11-11 NOTE — Patient Instructions (Addendum)
Your physician recommends that you continue on your current medications as directed. Please refer to the Current Medication list given to you today.  Your physician recommends that you return for lab work today (CBC, BNP)   Remote monitoring is used to monitor your Pacemaker of ICD from home. This monitoring reduces the number of office visits required to check your device to one time per year. It allows [place] to keep an eye on the functioning of your device to ensure it is working properly. You are scheduled for a device check from home on 11/24/16. You may send your transmission at any time that day. If you have a wireless device, the transmission will be sent automatically. After your physician reviews your transmission, you will receive a postcard with your next transmission date.  Your physician wants you to follow-up in: 12 months with Dr. Caryl Comes. You will receive a reminder letter in the mail two months in advance. If you don't receive a letter, please call our office to schedule the follow-up appointment.  Addendum: staff message sent to Sharman Cheek, Rn--per Dr. Caryl Comes patient needs ICM referral.

## 2016-11-11 NOTE — Progress Notes (Signed)
Electrophysiology Office Note   Date:  11/11/2016   ID:  Mark Clayton, Mark Clayton 1940-06-19, MRN [id number]  PCP:  Mark Koch, MD  Cardiologist:   Primary Electrophysiologist:  Mark Axe, MD    No chief complaint on file.    History of Present Illness:  Mark Clayton is a 76 y.o. male is       Seen as an add-on today because of new complaints of fever and chills myalgias in this context of having been cardioverted last week.    He has a history of  ICD implanted for primary prevention. He had a 6949-lead which was replaced with a right ventricular pace sense lead at the time of generator replacement. Stress testing January 2012 demonstrated mild periinfarct ischemia.     Cath 09/20/14 and was found to have native three-vessel disease including a 90% RCA, but his LIMA-LAD and SVG-OM2-OM3 were patent. However, the SVG-RPDA-RPLB was occluded between the RPDA and the RPLB. The native RCA had a 90% stenosis. Mark Clayton treated the native RCA with a drug-eluting stent reducing the stenosis to 0. The SVG-RPDA portion of the graft is still patent  He had been much improved post stenting;    Symptomatic atrial fibrillation   Initially failed DCCV more recently, he was having problems with shortness of breath.    Saw Afib clinic 3/18  Was in sinus at that time     DATE TEST     /14    Echo   EF 20 %   6/16    Myoview   EF   %   6/16 Cath EF 20%  Patent LIMA to LAD. Patent jump graft SVG to OM 2 and OM 3. Patent SVG to PDA. The second portion of this graft which goes to the posterior lateral artery is occluded. >90% distal RCA stenosis  Successful drug-eluting stent     Date Cr IK Hgb  2/18   4.0 11.4  7/18 1.12         breathing better although has episodic dyspnea; no chest pain  Or edema  No bleeding   Past Medical History:  Diagnosis Date  . AICD (automatic cardioverter/defibrillator) present 01/17/2003   Medtronic Maximo 7232CX ICD, serial [id number]  .  Anemia 02-06-11   takes oral iron  . Arthritis    hands, knees  . CAD (coronary artery disease) 2003   a. h/o MI and CABG in 2003. b. s/p DES to SVG-RPDA-RPLB in 08/2014.  Marland Kitchen Cancer of sigmoid colon ([place]) 2012   a. s/p colon surgery.  . Carotid bruit   . Chronic systolic CHF (congestive heart failure) (HCC)    a. EF 20% in 2014.  Marland Kitchen Cough   . GERD (gastroesophageal reflux disease) 02-06-11  . HTN (hypertension)   . Hyperlipidemia   . Ischemic cardiomyopathy    a. EF 20% in 2014. (Master study EF >20%)  . LV (left ventricular) mural thrombus    a. Last echo 12/2012 EF 20% with mural thrombus, mod-severe left atrial enlargement. Per Dr. Olin Pia notes from that time, echo in 2009 demonstrated something similar - he had been on warfarin following prior MI and this was stopped after a number of months. Given lack of data regarding anticoagulation from chronic clot, he has not been on anticoagulation since.  . Myocardial infarct [facility])    2003   Past Surgical History:  Procedure Laterality Date  . CARDIAC CATHETERIZATION N/A 09/20/2014   Procedure: Left Heart Cath  and Coronary Angiography;  Surgeon: Jettie Booze, MD; LAD 95%, D1 100%, CFX liner percent, OM 200%, OM 390%, RCA 90%, LIMA-LAD okay, SVG-OM 2-OM 3 minimal disease, SVG-RPDA-RPLB 100% between the RPDA and RPL     . CARDIAC CATHETERIZATION N/A 09/20/2014   Procedure: Coronary Stent Intervention;  Surgeon: Jettie Booze, MD; Synergy DES 4 x 24 mm reducing the stenosis to 5%   . CARDIAC DEFIBRILLATOR PLACEMENT  01/17/03   6949 lead. Medtronic. remote-no; with later revision  . CARDIOVERSION N/A 05/22/2016   Procedure: CARDIOVERSION;  Surgeon: Lelon Perla, MD;  Location: [facility] ENDOSCOPY;  Service: Cardiovascular;  Laterality: N/A;  . CATARACT EXTRACTION W/ INTRAOCULAR LENS  IMPLANT, BILATERAL Bilateral June/-July 2009   Dr. Katy Fitch  . COLON RESECTION  02/09/2011   Procedure: LAPAROSCOPIC SIGMOID COLON RESECTION;  Surgeon:  Pedro Earls, MD;  Location: [facility] ORS;  Service: General;  Laterality: N/A;  Laparoscopic Assisted Sigmoid Colectomy  . COLON SURGERY    . COLONOSCOPY  08/31/2011   Procedure: COLONOSCOPY;  Surgeon: Jerene Bears, MD;  Location: [facility] ENDOSCOPY;  Service: Gastroenterology;  Laterality: N/A;  . COLONOSCOPY N/A 09/05/2012   Procedure: COLONOSCOPY;  Surgeon: Jerene Bears, MD;  Location: [facility] ENDOSCOPY;  Service: Gastroenterology;  Laterality: N/A;  . COLONOSCOPY N/A 04/18/2013   Procedure: COLONOSCOPY;  Surgeon: Jerene Bears, MD;  Location: [facility] ENDOSCOPY;  Service: Gastroenterology;  Laterality: N/A;  . COLONOSCOPY N/A 04/09/2014   Procedure: COLONOSCOPY;  Surgeon: Jerene Bears, MD;  Location: [facility] ENDOSCOPY;  Service: Gastroenterology;  Laterality: N/A;  . CORONARY ANGIOPLASTY    . CORONARY ARTERY BYPASS GRAFT  01/2002   LIMA-LAD, SVG-OM 2-OM 3, SVG-RPDA-RPLB  . ICD GENERATOR CHANGE  2010   Medtronic Virtuoso II VR ICD  . INGUINAL HERNIA REPAIR Right 2000's X 2  . LAPAROSCOPIC RIGHT HEMI COLECTOMY N/A 11/04/2012   Procedure: LAPAROSCOPIC RIGHT HEMI COLECTOMY;  Surgeon: Pedro Earls, MD;  Location: [facility] ORS;  Service: General;  Laterality: N/A;  . TONSILLECTOMY  ~ 1950     Current Outpatient Prescriptions  Medication Sig Dispense Refill  . acetaminophen (TYLENOL) 500 MG tablet Take 500-1,000 mg by mouth daily as needed for moderate pain or headache.    . carvedilol (COREG) 12.5 MG tablet Take 12.5 mg by mouth 2 (two) times daily with a meal.    . furosemide (LASIX) 40 MG tablet Take 40 mg by mouth daily.    . isosorbide mononitrate (IMDUR) 30 MG 24 hr tablet TAKE 1 TABLET BY MOUTH  DAILY 90 tablet 3  . losartan (COZAAR) 50 MG tablet Take 1 tablet (50 mg total) by mouth daily. 90 tablet 3  . meclizine (ANTIVERT) 25 MG tablet Take 1 tablet (25 mg total) by mouth daily as needed for dizziness. 30 tablet 1  . nitroGLYCERIN (NITROSTAT) 0.4 MG SL tablet DISSOLVE 1 TABLET UNDER THE TONGUE EVERY 5 MINUTES AS   NEEDED FOR CHEST PAIN UP TO 3 DOSES 25 tablet 0  . pantoprazole (PROTONIX) 40 MG tablet Take 1 tablet (40 mg total) by mouth daily. 90 tablet 3  . pravastatin (PRAVACHOL) 20 MG tablet Take 1 tablet (20 mg total) by mouth at bedtime. 90 tablet 3  . PRESCRIPTION MEDICATION 1 each every 5 (five) weeks. Eye Injection every 5 weeks.    . rivaroxaban (XARELTO) 20 MG TABS tablet Take 1 tablet (20 mg total) by mouth daily with supper. 90 tablet 3   No current facility-administered medications for this visit.  Allergies:   Latex and Tape   Social History:  The patient  reports that he has never smoked. He has never used smokeless tobacco. He reports that he drinks about 4.2 oz of alcohol per week . He reports that he does not use drugs.   Family History:  The patient's family history includes Alzheimer's disease in his mother; Heart disease in his father; Hyperlipidemia in his father and sister; Hypertension in his father and sister; Prostate cancer in his father.    ROS:  Please see the history of present illness and past medical history  Otherwise, all other systems were reviewed and were negative .     PHYSICAL EXAM: VS:  BP 138/66   Pulse (!) 58   Ht 6' (1.829 m)   Wt 164 lb (74.4 kg)   BMI 22.24 kg/m  , BMI Body mass index is 22.24 kg/m. Well developed and nourished in no acute distress HENT normal Neck supple with JVP-flat Carotids brisk and full without bruits Clear Regular rate and rhythm, no murmurs or gallops Abd-soft with active BS without hepatomegaly No Clubbing cyanosis edema Skin-warm and dry A & Oriented  Grossly normal sensory and motor function  EKG: Sinus at 58 20/17/46 IVCD   Device interrogation is reviewed today in detail.  See PaceArt for details.   Recent Labs: 01/10/2016: ALT 9 05/26/2016: Hemoglobin 11.4; Platelets 187.0 09/25/2016: Pro B Natriuretic peptide (BNP) 1,553.0 10/02/2016: BUN 18; Creatinine, Ser 1.12; Potassium 4.6; Sodium 144    Lipid  Panel     Component Value Date/Time   CHOL 177 01/10/2016 0907   TRIG 88.0 01/10/2016 0907   HDL 53.10 01/10/2016 0907   CHOLHDL 3 01/10/2016 0907   VLDL 17.6 01/10/2016 0907   LDLCALC 107 (H) 01/10/2016 0907     Wt Readings from Last 3 Encounters:  11/11/16 164 lb (74.4 kg)  10/02/16 159 lb (72.1 kg)  09/25/16 165 lb (74.8 kg)      Other studies Reviewed: Additional studies/ records that were reviewed today include:  Myoview scan 2012 demonstrated large scar without ischemia EF was 29%      ASSESSMENT AND PLAN:  Ischemic cardio myopathy  Implantable defibrillator-Medtronic-single chamber  The patient's device was interrogated.  The information was reviewed. No changes were made in the programming.    Hypertension   IVCD  Atrial fibrillation persistent  Congestive heart failure-chronic-systolic  PVCs  Anemia  Hyperlipidiemia  Will recheck Hgb  Referral to ICM clinic   Euvolemic continue current meds   His BNP have been high,  Now that he is feeling better will recheck and let it serve [place] as baseline   Without symptoms of ischemia  BP wll controlled    Needs augmented statin therapy  Will anticipate discussion at next visit    More than 50% of 40 min was spent in counseling related to the above       No orders of the defined types were placed in this encounter.    Disposition:   FU with me  In 012 m Signed, Mark Axe, MD  11/11/2016 3:00 PM     [address] [place] [address] [phone] (office) [phone] (fax)

## 2016-11-12 LAB — CBC
Hematocrit: 34.3 % — ABNORMAL LOW (ref 37.5–51.0)
Hemoglobin: 10.9 g/dL — ABNORMAL LOW (ref 13.0–17.7)
MCH: 26.5 pg — ABNORMAL LOW (ref 26.6–33.0)
MCHC: 31.8 g/dL (ref 31.5–35.7)
MCV: 84 fL (ref 79–97)
Platelets: 178 10*3/uL (ref 150–379)
RBC: 4.11 x10E6/uL — ABNORMAL LOW (ref 4.14–5.80)
RDW: 16.4 % — ABNORMAL HIGH (ref 12.3–15.4)
WBC: 6.1 10*3/uL (ref 3.4–10.8)

## 2016-11-12 LAB — PRO B NATRIURETIC PEPTIDE: NT-Pro BNP: 1201 pg/mL — ABNORMAL HIGH (ref 0–486)

## 2016-11-16 ENCOUNTER — Telehealth: Payer: Self-pay | Admitting: *Deleted

## 2016-11-16 DIAGNOSIS — H353221 Exudative age-related macular degeneration, left eye, with active choroidal neovascularization: Secondary | ICD-10-CM | POA: Diagnosis not present

## 2016-11-16 DIAGNOSIS — H353211 Exudative age-related macular degeneration, right eye, with active choroidal neovascularization: Secondary | ICD-10-CM | POA: Diagnosis not present

## 2016-11-16 DIAGNOSIS — H353122 Nonexudative age-related macular degeneration, left eye, intermediate dry stage: Secondary | ICD-10-CM | POA: Diagnosis not present

## 2016-11-16 DIAGNOSIS — H353112 Nonexudative age-related macular degeneration, right eye, intermediate dry stage: Secondary | ICD-10-CM | POA: Diagnosis not present

## 2016-11-16 NOTE — Telephone Encounter (Signed)
-----   Message from Deboraha Sprang, MD sent at 11/14/2016  1:02 PM EDT ----- Please Inform Patient that anemia is still present and he should get followed by PCP HIS BNP number is high, but it can be quite high in the elderly   Will follow but will presume 1000 or so will be baseline Thanks

## 2016-11-16 NOTE — Telephone Encounter (Signed)
Patient informed and copy sent to PCP. 

## 2016-11-17 ENCOUNTER — Telehealth: Payer: Self-pay

## 2016-11-17 NOTE — Telephone Encounter (Signed)
Referred to [facility] by Dr Caryl Comes.  Call to patient and provided ICM intro. He agreed to monthly ICM follow up and 1st ICM transmission scheduled for 11/24/2016.  Provided ICM number and encouraged to call for fluid symptoms.  He reported when he has fluid symptoms he normally takes extra Furosemide.

## 2016-11-20 IMAGING — NM NM MISC PROCEDURE
3 series · 18 of 18 positions shown · non-contrast
Comparison: none

[Series 1: stress-sum-em_(id)_sa · 6.4mm · 6.40mm/px · 6 of 64 frames shown]
[frame 6/64]
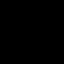
[frame 16/64]
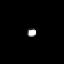
[frame 27/64]
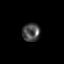
[frame 38/64]
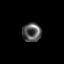
[frame 48/64]
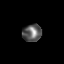
[frame 59/64]
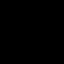

[Series 1: stress-gsp_(id)_sa · 6.4mm · 6.40mm/px · 6 of 512 frames shown]
[frame 43/512]
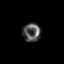
[frame 128/512]
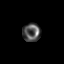
[frame 214/512]
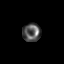
[frame 299/512]
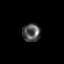
[frame 384/512]
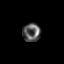
[frame 470/512]
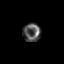

[Series 1: rest_(id)_sa · 6.4mm · 6.40mm/px · 6 of 64 frames shown]
[frame 6/64]
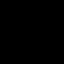
[frame 16/64]
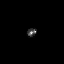
[frame 27/64]
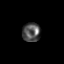
[frame 38/64]
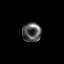
[frame 48/64]
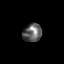
[frame 59/64]
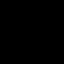

[18 of 18 positions shown; findings below may reference images not displayed]

Canned report from images found in remote index.

Refer to host system for actual result text.

## 2016-11-24 ENCOUNTER — Ambulatory Visit (INDEPENDENT_AMBULATORY_CARE_PROVIDER_SITE_OTHER): Payer: Medicare Other | Admitting: *Deleted

## 2016-11-24 DIAGNOSIS — I255 Ischemic cardiomyopathy: Secondary | ICD-10-CM | POA: Diagnosis not present

## 2016-11-24 DIAGNOSIS — I5022 Chronic systolic (congestive) heart failure: Secondary | ICD-10-CM

## 2016-11-24 DIAGNOSIS — Z95810 Presence of automatic (implantable) cardiac defibrillator: Secondary | ICD-10-CM

## 2016-11-24 DIAGNOSIS — I2589 Other forms of chronic ischemic heart disease: Secondary | ICD-10-CM

## 2016-11-24 LAB — CUP PACEART REMOTE DEVICE CHECK
Battery Voltage: 2.83 V
Brady Statistic RV Percent Paced: 0.66 %
Date Time Interrogation Session: 20180828041707
HighPow Impedance: 42 Ohm
HighPow Impedance: 52 Ohm
Implantable Lead Implant Date: 20041020
Implantable Lead Implant Date: 20100913
Implantable Lead Location: 753860
Implantable Lead Location: 753860
Implantable Lead Model: 5076
Implantable Lead Model: 6949
Implantable Pulse Generator Implant Date: 20100913
Lead Channel Impedance Value: 475 Ohm
Lead Channel Sensing Intrinsic Amplitude: 31.625 mV
Lead Channel Sensing Intrinsic Amplitude: 31.625 mV
Lead Channel Setting Pacing Amplitude: 3 V
Lead Channel Setting Pacing Pulse Width: 0.8 ms
Lead Channel Setting Sensing Sensitivity: 0.45 mV

## 2016-11-24 NOTE — Addendum Note (Signed)
Addended by: Rosalene Billings on: 11/24/2016 04:10 PM   Modules accepted: Level of Service

## 2016-11-24 NOTE — Progress Notes (Signed)
EPIC Encounter for ICM Monitoring  Patient Name: Mark Clayton is a 76 y.o. male Date: 11/24/2016 Primary Care Physican: [name], MD Primary Cardiologist: Stanford Breed Electrophysiologist: Caryl Comes Dry Weight: 160 lbs       Heart Failure questions reviewed, pt asymptomatic.   Thoracic impedance abnormal suggesting fluid accumulation since 11/18/2016.  Prescribed dosage: Furosemide 40 mg 1 tablet daily  Recommendations:  He reported he will take 1 extra Furosemide which is what he has been instructed to do if he has fluid.   Encouraged to call for fluid symptoms.  Follow-up plan: ICM clinic phone appointment on 12/14/2016.  Office appointment scheduled 01/14/2017 with Dr. Stanford Breed.  Copy of ICM check sent to Dr. Caryl Comes.   3 month ICM trend: 11/24/2016    1 Year ICM trend:      Rosalene Billings, RN 11/24/2016 3:54 PM

## 2016-11-24 NOTE — Progress Notes (Signed)
Remote ICD transmission.   

## 2016-12-04 ENCOUNTER — Encounter: Payer: Self-pay | Admitting: Cardiology

## 2016-12-07 ENCOUNTER — Ambulatory Visit (INDEPENDENT_AMBULATORY_CARE_PROVIDER_SITE_OTHER): Payer: Medicare Other | Admitting: Internal Medicine

## 2016-12-07 ENCOUNTER — Encounter: Payer: Self-pay | Admitting: Internal Medicine

## 2016-12-07 ENCOUNTER — Other Ambulatory Visit (INDEPENDENT_AMBULATORY_CARE_PROVIDER_SITE_OTHER): Payer: Medicare Other

## 2016-12-07 VITALS — BP 126/70 | HR 50 | Temp 97.6°F | Ht 72.0 in | Wt 158.0 lb

## 2016-12-07 DIAGNOSIS — G609 Hereditary and idiopathic neuropathy, unspecified: Secondary | ICD-10-CM

## 2016-12-07 DIAGNOSIS — G629 Polyneuropathy, unspecified: Secondary | ICD-10-CM

## 2016-12-07 DIAGNOSIS — I255 Ischemic cardiomyopathy: Secondary | ICD-10-CM

## 2016-12-07 LAB — BRAIN NATRIURETIC PEPTIDE: Pro B Natriuretic peptide (BNP): 745 pg/mL — ABNORMAL HIGH (ref 0.0–100.0)

## 2016-12-07 LAB — CBC
HCT: 39.8 % (ref 39.0–52.0)
Hemoglobin: 12.8 g/dL — ABNORMAL LOW (ref 13.0–17.0)
MCHC: 32.1 g/dL (ref 30.0–36.0)
MCV: 85.8 fl (ref 78.0–100.0)
Platelets: 193 10*3/uL (ref 150.0–400.0)
RBC: 4.64 Mil/uL (ref 4.22–5.81)
RDW: 17.8 % — ABNORMAL HIGH (ref 11.5–15.5)
WBC: 5.5 10*3/uL (ref 4.0–10.5)

## 2016-12-07 LAB — FERRITIN: Ferritin: 12.2 ng/mL — ABNORMAL LOW (ref 22.0–322.0)

## 2016-12-07 LAB — TSH: TSH: 5.22 u[IU]/mL — ABNORMAL HIGH (ref 0.35–4.50)

## 2016-12-07 LAB — VITAMIN B12: Vitamin B-12: 363 pg/mL (ref 211–911)

## 2016-12-07 LAB — HEMOGLOBIN A1C: Hgb A1c MFr Bld: 6.2 % (ref 4.6–6.5)

## 2016-12-07 MED ORDER — ZOSTER VAC RECOMB ADJUVANTED 50 MCG/0.5ML IM SUSR: 0.5000 mL | Freq: Once | INTRAMUSCULAR | 1 refills | Status: AC

## 2016-12-07 NOTE — Assessment & Plan Note (Signed)
Could be related to blood flow with his CAD. No history of diabetes. Checking TSH, B12, CBC, HgA1c, BNP. No pain and no treatment indicated at this time.

## 2016-12-07 NOTE — Progress Notes (Signed)
   Subjective:    Patient ID: Mark Clayton, male    DOB: 03-01-1941, 76 y.o.   MRN: [id number]  HPI The patient is a 76 YO man coming in for new problem of numbness in his feet. He has noticed that the ground does not feel as firm under his feet for some time. He had a well exam with a nurse and did not have sensation on the bottom of the feet. No change to diet or exercise. No cuts or sore on the feet. No history of diabetes. Does have CAD no symptoms lately. Denies burning pain in his feet. No weight change. Has had anemia in the past with colon polyps.   Review of Systems  Constitutional: Negative.   HENT: Negative.   Eyes: Negative.   Respiratory: Negative for cough, chest tightness and shortness of breath.   Cardiovascular: Negative for chest pain, palpitations and leg swelling.  Gastrointestinal: Negative for abdominal distention, abdominal pain, constipation, diarrhea, nausea and vomiting.  Musculoskeletal: Negative.   Skin: Negative.   Neurological: Positive for numbness. Negative for dizziness, tremors, syncope, facial asymmetry and weakness.  Psychiatric/Behavioral: Negative.       Objective:   Physical Exam  Constitutional: He is oriented to person, place, and time. He appears well-developed and well-nourished.  HENT:  Head: Normocephalic and atraumatic.  Eyes: EOM are normal.  Neck: Normal range of motion.  Cardiovascular: Normal rate and regular rhythm.   PT pulses bilaterally intact  Pulmonary/Chest: Effort normal and breath sounds normal. No respiratory distress. He has no wheezes. He has no rales.  Abdominal: Soft. Bowel sounds are normal. He exhibits no distension. There is no tenderness. There is no rebound.  Musculoskeletal: He exhibits no edema.  Neurological: He is alert and oriented to person, place, and time. Coordination normal.  Skin: Skin is warm and dry.  Feet with loss of sensation on the bottom of feet, no sores, mild callusing.   Psychiatric: He has a  normal mood and affect.   Vitals:   12/07/16 1040  BP: 126/70  Pulse: (!) 50  Temp: 97.6 F (36.4 C)  TempSrc: Oral  SpO2: 99%  Weight: 158 lb (71.7 kg)  Height: 6' (1.829 m)      Assessment & Plan:  Flu shot given at visit.

## 2016-12-07 NOTE — Patient Instructions (Addendum)
We will check the labs for a cause of the numbness in the feet.   We have given you the flu shot today.   We have given you the prescription for shingrix which is the new shingles shot. Take it to the pharmacy at least 1 week later than the flu shot.

## 2016-12-14 ENCOUNTER — Ambulatory Visit (INDEPENDENT_AMBULATORY_CARE_PROVIDER_SITE_OTHER): Payer: Medicare Other

## 2016-12-14 DIAGNOSIS — I5022 Chronic systolic (congestive) heart failure: Secondary | ICD-10-CM

## 2016-12-14 DIAGNOSIS — Z95810 Presence of automatic (implantable) cardiac defibrillator: Secondary | ICD-10-CM | POA: Diagnosis not present

## 2016-12-14 NOTE — Progress Notes (Signed)
EPIC Encounter for ICM Monitoring  Patient Name: Mark Clayton is a 76 y.o. male Date: 12/14/2016 Primary Care Physican: [name], MD Primary Cardiologist: Stanford Breed Electrophysiologist: Caryl Comes Dry Weight:  161 lbs                                                   Heart Failure questions reviewed, pt asymptomatic.   Thoracic impedance normal.  Prescribed dosage: Furosemide 40 mg 1 tablet daily  Recommendations: No changes.   Encouraged to call for fluid symptoms.  Follow-up plan: ICM clinic phone appointment on 01/14/2017.  Office appointment scheduled 01/14/2017 with Dr. Stanford Breed.  Copy of ICM check sent to Dr. Caryl Comes.   3 month ICM trend: 12/14/2016   1 Year ICM trend:      Rosalene Billings, RN 12/14/2016 9:43 AM

## 2016-12-21 DIAGNOSIS — H3562 Retinal hemorrhage, left eye: Secondary | ICD-10-CM | POA: Diagnosis not present

## 2016-12-21 DIAGNOSIS — H35722 Serous detachment of retinal pigment epithelium, left eye: Secondary | ICD-10-CM | POA: Diagnosis not present

## 2016-12-21 DIAGNOSIS — H353122 Nonexudative age-related macular degeneration, left eye, intermediate dry stage: Secondary | ICD-10-CM | POA: Diagnosis not present

## 2016-12-21 DIAGNOSIS — H353221 Exudative age-related macular degeneration, left eye, with active choroidal neovascularization: Secondary | ICD-10-CM | POA: Diagnosis not present

## 2016-12-21 DIAGNOSIS — H35362 Drusen (degenerative) of macula, left eye: Secondary | ICD-10-CM | POA: Diagnosis not present

## 2016-12-22 ENCOUNTER — Other Ambulatory Visit: Payer: Self-pay | Admitting: Internal Medicine

## 2016-12-25 ENCOUNTER — Other Ambulatory Visit: Payer: Self-pay | Admitting: Internal Medicine

## 2017-01-04 ENCOUNTER — Encounter: Payer: Self-pay | Admitting: Cardiology

## 2017-01-04 ENCOUNTER — Ambulatory Visit: Payer: Medicare Other | Admitting: Cardiology

## 2017-01-04 NOTE — Progress Notes (Deleted)
HPI: Follow-up coronary artery disease, ischemic cardiomyopathy, prior ICD, chronic systolic congestive heart failure and paroxysmal atrial fibrillation. Patient has been followed by Dr. Caryl Comes and is my initial evaluation. Patient is status post coronary artery bypass graft in 2003. Abdominal CT June 2014 showed no aneurysm. Last echocardiogram October 2014 showed ejection fraction 20%, apical mural thrombus, restrictive filling, mild mitral regurgitation and moderate to severe left atrial enlargement; moderate RV dysfunction. Patient's last cardiac catheterization in June 2016 showed severe three-vessel native coronary artery disease. The patient had a patent LIMA to LAD. There was a jump graft to the second and third marginal that was patent. There was a patent saphenous vein graft to the PDA but the second portion of the graft to the posterior lateral was occluded. There was a 90% distal right coronary artery stenosis and patient had a drug-eluting stent at that time. Holter monitor February 2018 showed sinus rhythm with PVCs, occasional couplet and PACs. No atrial fibrillation noted.   Current Outpatient Prescriptions  Medication Sig Dispense Refill  . acetaminophen (TYLENOL) 500 MG tablet Take 500-1,000 mg by mouth daily as needed for moderate pain or headache.    . carvedilol (COREG) 12.5 MG tablet Take 12.5 mg by mouth 2 (two) times daily with a meal.    . carvedilol (COREG) 25 MG tablet TAKE 1 TABLET BY MOUTH AT  BEDTIME 90 tablet 0  . furosemide (LASIX) 40 MG tablet TAKE 1 TABLET BY MOUTH  DAILY 90 tablet 1  . isosorbide mononitrate (IMDUR) 30 MG 24 hr tablet TAKE 1 TABLET BY MOUTH  DAILY 90 tablet 3  . losartan (COZAAR) 50 MG tablet Take 1 tablet (50 mg total) by mouth daily. 90 tablet 3  . meclizine (ANTIVERT) 25 MG tablet Take 1 tablet (25 mg total) by mouth daily as needed for dizziness. 30 tablet 1  . nitroGLYCERIN (NITROSTAT) 0.4 MG SL tablet DISSOLVE 1 TABLET UNDER THE TONGUE  EVERY 5 MINUTES AS  NEEDED FOR CHEST PAIN UP TO 3 DOSES 25 tablet 0  . pantoprazole (PROTONIX) 40 MG tablet Take 1 tablet (40 mg total) by mouth daily. 90 tablet 3  . pravastatin (PRAVACHOL) 20 MG tablet Take 1 tablet (20 mg total) by mouth at bedtime. 90 tablet 3  . PRESCRIPTION MEDICATION 1 each every 5 (five) weeks. Eye Injection every 5 weeks.    . rivaroxaban (XARELTO) 20 MG TABS tablet Take 1 tablet (20 mg total) by mouth daily with supper. 90 tablet 3   No current facility-administered medications for this visit.      Past Medical History:  Diagnosis Date  . AICD (automatic cardioverter/defibrillator) present 01/17/2003   Medtronic Maximo 7232CX ICD, serial [id number]  . Anemia 02-06-11   takes oral iron  . Arthritis    hands, knees  . CAD (coronary artery disease) 2003   a. h/o MI and CABG in 2003. b. s/p DES to SVG-RPDA-RPLB in 08/2014.  Marland Kitchen Cancer of sigmoid colon ([place]) 2012   a. s/p colon surgery.  . Carotid bruit   . Chronic systolic CHF (congestive heart failure) (HCC)    a. EF 20% in 2014.  Marland Kitchen Cough   . GERD (gastroesophageal reflux disease) 02-06-11  . HTN (hypertension)   . Hyperlipidemia   . Ischemic cardiomyopathy    a. EF 20% in 2014. (Master study EF >20%)  . LV (left ventricular) mural thrombus    a. Last echo 12/2012 EF 20% with mural thrombus, mod-severe left atrial enlargement.  Per Dr. Olin Pia notes from that time, echo in 2009 demonstrated something similar - he had been on warfarin following prior MI and this was stopped after a number of months. Given lack of data regarding anticoagulation from chronic clot, he has not been on anticoagulation since.  . Myocardial infarct [facility])    2003    Past Surgical History:  Procedure Laterality Date  . CARDIAC CATHETERIZATION N/A 09/20/2014   Procedure: Left Heart Cath and Coronary Angiography;  Surgeon: Jettie Booze, MD; LAD 95%, D1 100%, CFX liner percent, OM 200%, OM 390%, RCA 90%, LIMA-LAD okay, SVG-OM 2-OM  3 minimal disease, SVG-RPDA-RPLB 100% between the RPDA and RPL     . CARDIAC CATHETERIZATION N/A 09/20/2014   Procedure: Coronary Stent Intervention;  Surgeon: Jettie Booze, MD; Synergy DES 4 x 24 mm reducing the stenosis to 5%   . CARDIAC DEFIBRILLATOR PLACEMENT  01/17/03   6949 lead. Medtronic. remote-no; with later revision  . CARDIOVERSION N/A 05/22/2016   Procedure: CARDIOVERSION;  Surgeon: Lelon Perla, MD;  [facility] ENDOSCOPY;  Service: Cardiovascular;  Laterality: N/A;  . CATARACT EXTRACTION W/ INTRAOCULAR LENS  IMPLANT, BILATERAL Bilateral June/-July 2009   Dr. Katy Fitch  . COLON RESECTION  02/09/2011   Procedure: LAPAROSCOPIC SIGMOID COLON RESECTION;  Surgeon: Pedro Earls, MD;  Location: [facility] ORS;  Service: General;  Laterality: N/A;  Laparoscopic Assisted Sigmoid Colectomy  . COLON SURGERY    . COLONOSCOPY  08/31/2011   Procedure: COLONOSCOPY;  Surgeon: Jerene Bears, MD;  Location[facility] ENDOSCOPY;  Service: Gastroenterology;  Laterality: N/A;  . COLONOSCOPY N/A 09/05/2012   Procedure: COLONOSCOPY;  Surgeon: Jerene Bears, MD;  Location: [facility] ENDOSCOPY;  Service: Gastroenterology;  Laterality: N/A;  . COLONOSCOPY N/A 04/18/2013   Procedure: COLONOSCOPY;  Surgeon: Jerene Bears, MD;  Location: [facility] ENDOSCOPY;  Service: Gastroenterology;  Laterality: N/A;  . COLONOSCOPY N/A 04/09/2014   Procedure: COLONOSCOPY;  Surgeon: Jerene Bears, MD;  Location: [facility] ENDOSCOPY;  Service: Gastroenterology;  Laterality: N/A;  . CORONARY ANGIOPLASTY    . CORONARY ARTERY BYPASS GRAFT  01/2002   LIMA-LAD, SVG-OM 2-OM 3, SVG-RPDA-RPLB  . ICD GENERATOR CHANGE  2010   Medtronic Virtuoso II VR ICD  . INGUINAL HERNIA REPAIR Right 2000's X 2  . LAPAROSCOPIC RIGHT HEMI COLECTOMY N/A 11/04/2012   Procedure: LAPAROSCOPIC RIGHT HEMI COLECTOMY;  Surgeon: Pedro Earls, MD;  Location: [facility] ORS;  Service: General;  Laterality: N/A;  . TONSILLECTOMY  ~ 1950    Social History   Social History  . Marital status:  Widowed    Spouse name: N/A  . Number of children: 3  . Years of education: N/A   Occupational History  . self employed    Social History Main Topics  . Smoking status: Never Smoker  . Smokeless tobacco: Never Used  . Alcohol use 4.2 oz/week    7 Cans of beer per week  . Drug use: No  . Sexual activity: Yes   Other Topics Concern  . Not on file   Social History Narrative   Full time. Married.     Family History  Problem Relation Age of Onset  . Hypertension Father   . Hyperlipidemia Father   . Heart disease Father   . Prostate cancer Father   . Alzheimer's disease Mother   . Hypertension Sister   . Hyperlipidemia Sister   . Colon cancer Neg Hx   . Esophageal cancer Neg Hx   . Rectal cancer  Neg Hx   . Stomach cancer Neg Hx     ROS: no fevers or chills, productive cough, hemoptysis, dysphasia, odynophagia, melena, hematochezia, dysuria, hematuria, rash, seizure activity, orthopnea, PND, pedal edema, claudication. Remaining systems are negative.  Physical Exam: Well-developed well-nourished in no acute distress.  Skin is warm and dry.  HEENT is normal.  Neck is supple.  Chest is clear to auscultation with normal expansion.  Cardiovascular exam is regular rate and rhythm.  Abdominal exam nontender or distended. No masses palpated. Extremities show no edema. neuro grossly intact  ECG- personally reviewed  A/P  1  [name], MD

## 2017-01-06 ENCOUNTER — Other Ambulatory Visit: Payer: Self-pay | Admitting: Internal Medicine

## 2017-01-12 ENCOUNTER — Other Ambulatory Visit: Payer: Self-pay | Admitting: Internal Medicine

## 2017-01-14 ENCOUNTER — Ambulatory Visit: Payer: Medicare Other | Admitting: Cardiology

## 2017-01-14 ENCOUNTER — Ambulatory Visit (INDEPENDENT_AMBULATORY_CARE_PROVIDER_SITE_OTHER): Payer: Medicare Other

## 2017-01-14 DIAGNOSIS — Z95810 Presence of automatic (implantable) cardiac defibrillator: Secondary | ICD-10-CM

## 2017-01-14 DIAGNOSIS — I5022 Chronic systolic (congestive) heart failure: Secondary | ICD-10-CM | POA: Diagnosis not present

## 2017-01-14 NOTE — Progress Notes (Signed)
EPIC Encounter for ICM Monitoring  Patient Name: Mark Clayton is a 76 y.o. male Date: 01/14/2017 Primary Care Physican: [name], MD Primary Cardiologist: Stanford Breed Electrophysiologist: Caryl Comes Dry Weight:161lbs      Heart Failure questions reviewed, pt asymptomatic.   Thoracic impedance normal.  Prescribed dosage: Furosemide 40 mg 1 tablet daily  Recommendations: No changes.   Encouraged to call for fluid symptoms.  Follow-up plan: ICM clinic phone appointment on 02/23/2017.   Copy of ICM check sent to Dr. Caryl Comes.   3 month ICM trend: 01/14/2017   1 Year ICM trend:      Rosalene Billings, RN 01/14/2017 4:29 PM

## 2017-01-15 ENCOUNTER — Ambulatory Visit (INDEPENDENT_AMBULATORY_CARE_PROVIDER_SITE_OTHER): Payer: Medicare Other | Admitting: Internal Medicine

## 2017-01-15 ENCOUNTER — Encounter: Payer: Self-pay | Admitting: Internal Medicine

## 2017-01-15 ENCOUNTER — Other Ambulatory Visit (INDEPENDENT_AMBULATORY_CARE_PROVIDER_SITE_OTHER): Payer: Medicare Other

## 2017-01-15 VITALS — BP 138/78 | HR 53 | Temp 97.8°F | Ht 72.0 in | Wt 167.0 lb

## 2017-01-15 DIAGNOSIS — Z0000 Encounter for general adult medical examination without abnormal findings: Secondary | ICD-10-CM | POA: Diagnosis not present

## 2017-01-15 DIAGNOSIS — I5022 Chronic systolic (congestive) heart failure: Secondary | ICD-10-CM | POA: Diagnosis not present

## 2017-01-15 DIAGNOSIS — Z85038 Personal history of other malignant neoplasm of large intestine: Secondary | ICD-10-CM

## 2017-01-15 DIAGNOSIS — E785 Hyperlipidemia, unspecified: Secondary | ICD-10-CM | POA: Diagnosis not present

## 2017-01-15 LAB — COMPREHENSIVE METABOLIC PANEL
ALT: 10 U/L (ref 0–53)
AST: 17 U/L (ref 0–37)
Albumin: 4.1 g/dL (ref 3.5–5.2)
Alkaline Phosphatase: 42 U/L (ref 39–117)
BUN: 10 mg/dL (ref 6–23)
CO2: 30 mEq/L (ref 19–32)
Calcium: 9.5 mg/dL (ref 8.4–10.5)
Chloride: 103 mEq/L (ref 96–112)
Creatinine, Ser: 0.97 mg/dL (ref 0.40–1.50)
GFR: 79.96 mL/min (ref >60.00)
Glucose, Bld: 107 mg/dL — ABNORMAL HIGH (ref 70–99)
Potassium: 4.5 mEq/L (ref 3.5–5.1)
Sodium: 141 mEq/L (ref 135–145)
Total Bilirubin: 0.9 mg/dL (ref 0.2–1.2)
Total Protein: 6.6 g/dL (ref 6.0–8.3)

## 2017-01-15 LAB — BRAIN NATRIURETIC PEPTIDE: Pro B Natriuretic peptide (BNP): 749 pg/mL — ABNORMAL HIGH (ref 0.0–100.0)

## 2017-01-15 LAB — LIPID PANEL
Cholesterol: 185 mg/dL (ref 0–200)
HDL: 52.6 mg/dL (ref >39.00)
LDL Cholesterol: 112 mg/dL — ABNORMAL HIGH (ref 0–99)
NonHDL: 132.02
Total CHOL/HDL Ratio: 4
Triglycerides: 100 mg/dL (ref 0.0–149.0)
VLDL: 20 mg/dL (ref 0.0–40.0)

## 2017-01-15 LAB — PSA: PSA: 1.51 ng/mL (ref 0.10–4.00)

## 2017-01-15 MED ORDER — PANTOPRAZOLE SODIUM 40 MG PO TBEC: 40.0000 mg | DELAYED_RELEASE_TABLET | Freq: Every day | ORAL | 3 refills | Status: DC

## 2017-01-15 MED ORDER — PRAVASTATIN SODIUM 20 MG PO TABS: 20.0000 mg | ORAL_TABLET | Freq: Every day | ORAL | 3 refills | Status: DC

## 2017-01-15 NOTE — Patient Instructions (Signed)

## 2017-01-15 NOTE — Assessment & Plan Note (Signed)
Due for colonoscopy 2019 already scheduled, flu and tetanus and pneumonia up to date. Counseled about sun safety and mole surveillance. Counseled about the benefits and risk of PSA screening. Given 10 year screening recommendations.

## 2017-01-15 NOTE — Assessment & Plan Note (Signed)
Checking lipid panel and adjust pravastatin as needed for goal LDL <100.

## 2017-01-15 NOTE — Assessment & Plan Note (Signed)
Checking BNP, no flare today. On statin, beta blocker, lasix, losartan.

## 2017-01-15 NOTE — Progress Notes (Signed)
   Subjective:    Patient ID: Mark Clayton, male    DOB: 06-16-1940, 76 y.o.   MRN: [id number]  HPI Here for medicare wellness and physical, no new complaints. Please see A/P for status and treatment of chronic medical problems.   Diet: heart healthy Physical activity: sedentary Depression/mood screen: negative Hearing: intact to whispered voice Visual acuity: grossly normal, performs annual eye exam  ADLs: capable Fall risk: none Home safety: good Cognitive evaluation: intact to orientation, naming, recall and repetition EOL planning: adv directives discussed  I have personally reviewed and have noted 1. The patient's medical and social history - reviewed today no changes 2. Their use of alcohol, tobacco or illicit drugs 3. Their current medications and supplements 4. The patient's functional ability including ADL's, fall risks, home safety risks and hearing or visual impairment. 5. Diet and physical activities 6. Evidence for depression or mood disorders 7. Care team reviewed and updated (available in snapshot)  Review of Systems  Constitutional: Negative.   HENT: Negative.   Eyes: Negative.   Respiratory: Negative for cough, chest tightness and shortness of breath.   Cardiovascular: Negative for chest pain, palpitations and leg swelling.  Gastrointestinal: Negative for abdominal distention, abdominal pain, constipation, diarrhea, nausea and vomiting.  Musculoskeletal: Negative.   Skin: Negative.   Neurological: Negative.   Psychiatric/Behavioral: Negative.       Objective:   Physical Exam  Constitutional: He is oriented to person, place, and time. He appears well-developed and well-nourished.  HENT:  Head: Normocephalic and atraumatic.  Eyes: EOM are normal.  Neck: Normal range of motion.  Cardiovascular: Normal rate and regular rhythm.   Pulmonary/Chest: Effort normal and breath sounds normal. No respiratory distress. He has no wheezes. He has no rales.    Abdominal: Soft. Bowel sounds are normal. He exhibits no distension. There is no tenderness. There is no rebound.  Musculoskeletal: He exhibits no edema.  Neurological: He is alert and oriented to person, place, and time. Coordination normal.  Skin: Skin is warm and dry.  Psychiatric: He has a normal mood and affect.   Vitals:   01/15/17 0807  BP: 138/78  Pulse: (!) 53  Temp: 97.8 F (36.6 C)  TempSrc: Oral  SpO2: 99%  Weight: 167 lb (75.8 kg)  Height: 6' (1.829 m)      Assessment & Plan:

## 2017-01-15 NOTE — Assessment & Plan Note (Signed)
Due colonoscopy 2019 and already scheduled.

## 2017-01-18 ENCOUNTER — Other Ambulatory Visit (HOSPITAL_COMMUNITY): Payer: Self-pay | Admitting: *Deleted

## 2017-01-18 ENCOUNTER — Telehealth: Payer: Self-pay | Admitting: Internal Medicine

## 2017-01-18 MED ORDER — RIVAROXABAN 20 MG PO TABS: 20.0000 mg | ORAL_TABLET | Freq: Every day | ORAL | 3 refills | Status: DC

## 2017-01-18 NOTE — Telephone Encounter (Signed)
Pt called for a refill of his rivaroxaban (XARELTO) 20 MG TABS tablet  Wants it sent to [facility] in [place]

## 2017-01-18 NOTE — Telephone Encounter (Signed)
Pt notified and will call cardiologists

## 2017-01-18 NOTE — Telephone Encounter (Signed)
Per chart pt get his Xarelto from his cardiologist Dr. Caryl Comes. He need to contact their office for refills...Mark Clayton

## 2017-01-25 ENCOUNTER — Ambulatory Visit (INDEPENDENT_AMBULATORY_CARE_PROVIDER_SITE_OTHER): Payer: Medicare Other | Admitting: Internal Medicine

## 2017-01-25 ENCOUNTER — Telehealth: Payer: Self-pay | Admitting: Internal Medicine

## 2017-01-25 ENCOUNTER — Encounter: Payer: Self-pay | Admitting: Internal Medicine

## 2017-01-25 DIAGNOSIS — J069 Acute upper respiratory infection, unspecified: Secondary | ICD-10-CM | POA: Diagnosis not present

## 2017-01-25 DIAGNOSIS — B9789 Other viral agents as the cause of diseases classified elsewhere: Secondary | ICD-10-CM

## 2017-01-25 NOTE — Patient Instructions (Signed)
Keep drinking plenty of fluids to help clear the cold.    Upper Respiratory Infection, Adult Most upper respiratory infections (URIs) are caused by a virus. A URI affects the nose, throat, and upper air passages. The most common type of URI is often called "the common cold." Follow these instructions at home:  Take medicines only as told by your doctor.  Gargle warm saltwater or take cough drops to comfort your throat as told by your doctor.  Use a warm mist humidifier or inhale steam from a shower to increase air moisture. This may make it easier to breathe.  Drink enough fluid to keep your pee (urine) clear or pale yellow.  Eat soups and other clear broths.  Have a healthy diet.  Rest as needed.  Go back to work when your fever is gone or your doctor says it is okay. ? You may need to stay home longer to avoid giving your URI to others. ? You can also wear a face mask and wash your hands often to prevent spread of the virus.  Use your inhaler more if you have asthma.  Do not use any tobacco products, including cigarettes, chewing tobacco, or electronic cigarettes. If you need help quitting, ask your doctor. Contact a doctor if:  You are getting worse, not better.  Your symptoms are not helped by medicine.  You have chills.  You are getting more short of breath.  You have [place] or red mucus.  You have yellow or [place] discharge from your nose.  You have pain in your face, especially when you bend forward.  You have a fever.  You have puffy (swollen) neck glands.  You have pain while swallowing.  You have white areas in the back of your throat. Get help right away if:  You have very bad or constant: ? Headache. ? Ear pain. ? Pain in your forehead, behind your eyes, and over your cheekbones (sinus pain). ? Chest pain.  You have long-lasting (chronic) lung disease and any of the following: ? Wheezing. ? Long-lasting cough. ? Coughing up blood. ? A change in  your usual mucus.  You have a stiff neck.  You have changes in your: ? Vision. ? Hearing. ? Thinking. ? Mood. This information is not intended to replace advice given to you by your health care provider. Make sure you discuss any questions you have with your health care provider. Document Released: 09/02/2007 Document Revised: 11/17/2015 Document Reviewed: 06/21/2013 Elsevier Interactive Patient Education  2018 [employer].

## 2017-01-25 NOTE — Assessment & Plan Note (Signed)
No indications for antibiotics or steroids. Advised not to take hycodan and cold medicine nyquil at the same time since he had poor reaction. Advised to start taking zyrtec daily for the next 2 weeks. Call with any worsening or lack of improvement by Friday to consider antibiotics. No signs of CHF.

## 2017-01-25 NOTE — Progress Notes (Signed)
   Subjective:    Patient ID: Mark Clayton, male    DOB: 1940/10/12, 76 y.o.   MRN: [id number]  HPI The patient is a 76 YO man coming in for viral symptoms. Started about 3-4 days ago. Started with sore throat, cough, chills. Then got some nasal congestion. He started taking dayquil and nyquil which helped his symptoms. He did have some benefit with this. Used some old cough medicine from last year and got some side effects with the cold medicine too. He was having some tiredness and weakness. He went to sleep and awoke feeling normal. He got worse and started getting better last night. Overall is feeling about 1/2 better now. Denies SOB or chest pains. Still some cough.   Review of Systems  Constitutional: Positive for activity change, appetite change, chills and fatigue. Negative for fever and unexpected weight change.  HENT: Positive for congestion, postnasal drip, rhinorrhea and sore throat. Negative for ear discharge, ear pain, sinus pain, sinus pressure, trouble swallowing and voice change.   Eyes: Negative.   Respiratory: Positive for cough. Negative for chest tightness, shortness of breath and wheezing.   Cardiovascular: Negative.   Gastrointestinal: Negative.   Musculoskeletal: Negative.       Objective:   Physical Exam  Constitutional: He is oriented to person, place, and time. He appears well-developed and well-nourished.  HENT:  Head: Normocephalic and atraumatic.  Oropharynx with redness and clear drainage, nose without crusting, TMs normal bilaterally  Eyes: EOM are normal.  Neck: Normal range of motion. No JVD present.  Cardiovascular: Normal rate and regular rhythm.   Pulmonary/Chest: Effort normal and breath sounds normal. No respiratory distress. He has no wheezes. He has no rales.  Abdominal: Soft.  Lymphadenopathy:    He has no cervical adenopathy.  Neurological: He is alert and oriented to person, place, and time. Coordination normal.  Skin: Skin is warm and dry.     Vitals:   01/25/17 1043  BP: 116/70  Pulse: (!) 59  Temp: 98.7 F (37.1 C)  TempSrc: Oral  SpO2: 99%  Weight: 168 lb (76.2 kg)  Height: 6' (1.829 m)      Assessment & Plan:

## 2017-01-25 NOTE — Telephone Encounter (Signed)
[place] Night - Client TELEPHONE [facility]  Patient Name: Mark Clayton  DOB: June 24, 1940    Initial Comment Caller states c/o fever, sore throat, hoarse and runny nose. OTC medications aren't helping.   Nurse Assessment  Nurse: Wynetta Emery, RN, Baker Janus Date/Time Eilene Ghazi Time): 01/25/2017 8:22:16 AM  Confirm and document reason for call. If symptomatic, describe symptoms. ---Wynetta Emery started with sore throat on Thursday and yesterday developed a cough -- took cough medicine that made him dizzy -- had fever but broke last night and is weak,  Does the patient have any new or worsening symptoms? ---Yes  Will a triage be completed? ---Yes  Related visit to physician within the last 2 weeks? ---No  Does the PT have any chronic conditions? (i.e. diabetes, asthma, etc.) ---No  Is this a behavioral health or substance abuse call? ---No     Guidelines    Guideline Title Affirmed Question Affirmed Notes  Cough - Acute Productive [1] Continuous (nonstop) coughing interferes with work or school AND [2] no improvement using cough treatment per Care Advice    Final Disposition User   See Physician within 24 Hours [place], RN, Baker Janus    Comments  Dr. Sharlet Salina 1045 am cough congestion hoarseness arrival time 1030am today   Referrals  REFERRED TO PCP OFFICE   Caller Disagree/Comply Comply  Caller Understands Yes  PreDisposition Call Doctor

## 2017-02-01 DIAGNOSIS — H35361 Drusen (degenerative) of macula, right eye: Secondary | ICD-10-CM | POA: Diagnosis not present

## 2017-02-01 DIAGNOSIS — H35362 Drusen (degenerative) of macula, left eye: Secondary | ICD-10-CM | POA: Diagnosis not present

## 2017-02-01 DIAGNOSIS — H353221 Exudative age-related macular degeneration, left eye, with active choroidal neovascularization: Secondary | ICD-10-CM | POA: Diagnosis not present

## 2017-02-01 DIAGNOSIS — H353122 Nonexudative age-related macular degeneration, left eye, intermediate dry stage: Secondary | ICD-10-CM | POA: Diagnosis not present

## 2017-02-01 DIAGNOSIS — H35722 Serous detachment of retinal pigment epithelium, left eye: Secondary | ICD-10-CM | POA: Diagnosis not present

## 2017-02-23 ENCOUNTER — Ambulatory Visit (INDEPENDENT_AMBULATORY_CARE_PROVIDER_SITE_OTHER): Payer: Medicare Other | Admitting: *Deleted

## 2017-02-23 DIAGNOSIS — I255 Ischemic cardiomyopathy: Secondary | ICD-10-CM | POA: Diagnosis not present

## 2017-02-23 DIAGNOSIS — I5022 Chronic systolic (congestive) heart failure: Secondary | ICD-10-CM

## 2017-02-23 DIAGNOSIS — Z95810 Presence of automatic (implantable) cardiac defibrillator: Secondary | ICD-10-CM

## 2017-02-23 NOTE — Progress Notes (Signed)
Remote ICD transmission.   

## 2017-02-25 LAB — CUP PACEART REMOTE DEVICE CHECK
Battery Voltage: 2.76 V
Brady Statistic RV Percent Paced: 0.82 %
Date Time Interrogation Session: 20181127083429
HighPow Impedance: 43 Ohm
HighPow Impedance: 54 Ohm
Implantable Lead Implant Date: 20041020
Implantable Lead Implant Date: 20100913
Implantable Lead Location: 753860
Implantable Lead Location: 753860
Implantable Lead Model: 5076
Implantable Lead Model: 6949
Implantable Pulse Generator Implant Date: 20100913
Lead Channel Impedance Value: 494 Ohm
Lead Channel Sensing Intrinsic Amplitude: 31.625 mV
Lead Channel Sensing Intrinsic Amplitude: 31.625 mV
Lead Channel Setting Pacing Amplitude: 3 V
Lead Channel Setting Pacing Pulse Width: 0.8 ms
Lead Channel Setting Sensing Sensitivity: 0.45 mV

## 2017-02-25 NOTE — Progress Notes (Signed)
EPIC Encounter for ICM Monitoring  Patient Name: Mark Clayton is a 76 y.o. male Date: 02/25/2017 Primary Care Physican: [name], MD Primary Cardiologist: Stanford Breed Electrophysiologist: Caryl Comes Dry Weight:162lbs          Heart Failure questions reviewed, pt asymptomatic.   Thoracic impedance abnormal suggesting fluid accumulation for the last couple of days.  He said he stopped taking Furosemide for a couple of days but has resumed it.   Prescribed dosage: Furosemide 40 mg 1 tablet daily  Recommendations: No changes.  Encouraged to call for fluid symptoms.  Follow-up plan: ICM clinic phone appointment on 03/26/2017.    Copy of ICM check sent to Dr. Caryl Comes.   3 month ICM trend: 02/23/2017    1 Year ICM trend:       Rosalene Billings, RN 02/25/2017 2:16 PM

## 2017-02-26 ENCOUNTER — Telehealth: Payer: Self-pay | Admitting: Internal Medicine

## 2017-02-26 ENCOUNTER — Encounter: Payer: Self-pay | Admitting: Cardiology

## 2017-02-26 NOTE — Telephone Encounter (Signed)
Patient states he needs to sch colon. Recall in system for hosp colon 01.2019.

## 2017-03-02 ENCOUNTER — Other Ambulatory Visit: Payer: Self-pay

## 2017-03-02 DIAGNOSIS — Z1211 Encounter for screening for malignant neoplasm of colon: Secondary | ICD-10-CM

## 2017-03-02 NOTE — Telephone Encounter (Signed)
Patient states this date is not good for him. Best call back  # is [phone].

## 2017-03-02 NOTE — Telephone Encounter (Signed)
Pt scheduled for colon at [facility] 04/13/17@11 :20am, case# [id number]. Left message for pt to call back regarding appt.

## 2017-03-03 NOTE — Telephone Encounter (Signed)
Pt wants colon on a Friday. Let him know we will call him back when schedule comes out with a Friday. Pt states he is not having any problems.

## 2017-03-03 NOTE — Telephone Encounter (Signed)
Patient returning call to resch procedure at [facility]. Pt states he would like a Monday if possible.

## 2017-03-10 ENCOUNTER — Other Ambulatory Visit: Payer: Self-pay | Admitting: Internal Medicine

## 2017-03-15 DIAGNOSIS — H353221 Exudative age-related macular degeneration, left eye, with active choroidal neovascularization: Secondary | ICD-10-CM | POA: Diagnosis not present

## 2017-03-15 DIAGNOSIS — H35722 Serous detachment of retinal pigment epithelium, left eye: Secondary | ICD-10-CM | POA: Diagnosis not present

## 2017-03-15 DIAGNOSIS — H3562 Retinal hemorrhage, left eye: Secondary | ICD-10-CM | POA: Diagnosis not present

## 2017-03-15 DIAGNOSIS — H353122 Nonexudative age-related macular degeneration, left eye, intermediate dry stage: Secondary | ICD-10-CM | POA: Diagnosis not present

## 2017-03-24 ENCOUNTER — Other Ambulatory Visit: Payer: Self-pay | Admitting: Internal Medicine

## 2017-03-24 ENCOUNTER — Telehealth: Payer: Self-pay | Admitting: Internal Medicine

## 2017-03-24 NOTE — Telephone Encounter (Signed)
Mark Clayton is calling about his medication Losartan . Please call

## 2017-03-25 NOTE — Telephone Encounter (Signed)
Left message for patient to call back  

## 2017-03-26 ENCOUNTER — Ambulatory Visit (INDEPENDENT_AMBULATORY_CARE_PROVIDER_SITE_OTHER): Payer: Medicare Other

## 2017-03-26 DIAGNOSIS — I5022 Chronic systolic (congestive) heart failure: Secondary | ICD-10-CM

## 2017-03-26 DIAGNOSIS — Z95810 Presence of automatic (implantable) cardiac defibrillator: Secondary | ICD-10-CM

## 2017-03-26 NOTE — Progress Notes (Signed)
EPIC Encounter for ICM Monitoring  Patient Name: Mark Clayton is a 76 y.o. male Date: 03/26/2017 Primary Care Physican: [name], MD Primary Cardiologist: Stanford Breed Electrophysiologist: Caryl Comes Dry Weight:160.6lbs       Heart Failure questions reviewed, pt asymptomatic.   Thoracic impedance normal.  Prescribed dosage: Furosemide 40 mg 1 tablet daily  Recommendations: No changes.   Encouraged to call for fluid symptoms.  Follow-up plan: ICM clinic phone appointment on 04/26/2017.    Copy of ICM check sent to Dr. Caryl Comes.   3 month ICM trend: 03/26/2017    1 Year ICM trend:       Rosalene Billings, RN 03/26/2017 2:20 PM

## 2017-04-02 ENCOUNTER — Other Ambulatory Visit: Payer: Self-pay

## 2017-04-02 ENCOUNTER — Telehealth: Payer: Self-pay

## 2017-04-02 DIAGNOSIS — I255 Ischemic cardiomyopathy: Secondary | ICD-10-CM

## 2017-04-02 DIAGNOSIS — Z95810 Presence of automatic (implantable) cardiac defibrillator: Secondary | ICD-10-CM

## 2017-04-02 DIAGNOSIS — I5022 Chronic systolic (congestive) heart failure: Secondary | ICD-10-CM

## 2017-04-02 MED ORDER — MECLIZINE HCL 25 MG PO TABS: 25.0000 mg | ORAL_TABLET | Freq: Every day | ORAL | 1 refills | Status: DC | PRN

## 2017-04-02 NOTE — Telephone Encounter (Signed)
Previsit scheduled for 04/19/17@2pm , colon scheduled at [facility] 05/03/17@11 :00PQ. Pt aware.

## 2017-04-02 NOTE — Progress Notes (Signed)
Spoke to patient for the procedure pre-call, he reported unable to have the colon on Tuesday, stated can have it only on Mondays or  Fridays due  to transport availability.

## 2017-04-02 NOTE — Telephone Encounter (Signed)
Appointment moved to 05/03/17@11 :30am.

## 2017-04-02 NOTE — Telephone Encounter (Signed)
This patient is scheduled for Colonoscopy at [facility] 05/03/17@11 :30am with Dr. Hilarie Fredrickson. He currently takes Xarelto. Please advise if patient may hold the xarelto for the procedure and for how long.

## 2017-04-02 NOTE — Telephone Encounter (Signed)
Please fill out Pre op assessment form with all information.

## 2017-04-02 NOTE — Telephone Encounter (Signed)
-----   Message from Jerene Bears, MD sent at 04/02/2017 11:21 AM EST ----- Yes you can Thanks JMP  ----- Message ----- From: Algernon Huxley, RN Sent: 04/02/2017  11:08 AM To: Jerene Bears, MD  Pyrtle,  This pt is scheduled for 04/13/17 and cannot come that day. States he has to have a Monday or Friday due to transportation. Can I add him on to 05/03/17? It would give you 5 that day.  Thanks, Vaughan Basta  ----- Message ----- From: Irven Baltimore Sent: 04/02/2017   9:33 AM To: Algernon Huxley, RN  Please call Radene Ou from pre surgical testing at [facility] [phone] regarding this patient  Thanks Steph

## 2017-04-02 NOTE — Telephone Encounter (Signed)
Pt takes Xarelto for afib with CHADS2VASc score of 5 (age x2, CHF, HTN, CAD). Also has history of mural thrombus. Renal function is normal. Ok to hold Xarelto for 24 hours prior to procedure.

## 2017-04-02 NOTE — Telephone Encounter (Signed)
Noted  

## 2017-04-05 NOTE — Telephone Encounter (Signed)
Pt was concerned that his losartan may have been recalled, he spoke with [facility] and was told his losartan had not been recalled. He thanked me for the call.

## 2017-04-09 ENCOUNTER — Other Ambulatory Visit: Payer: Self-pay | Admitting: Internal Medicine

## 2017-04-19 ENCOUNTER — Telehealth: Payer: Self-pay | Admitting: *Deleted

## 2017-04-19 ENCOUNTER — Encounter (HOSPITAL_COMMUNITY): Payer: Self-pay | Admitting: Emergency Medicine

## 2017-04-19 ENCOUNTER — Other Ambulatory Visit: Payer: Self-pay

## 2017-04-19 ENCOUNTER — Ambulatory Visit (AMBULATORY_SURGERY_CENTER): Payer: Self-pay | Admitting: *Deleted

## 2017-04-19 VITALS — Ht 72.0 in | Wt 165.8 lb

## 2017-04-19 DIAGNOSIS — H353122 Nonexudative age-related macular degeneration, left eye, intermediate dry stage: Secondary | ICD-10-CM | POA: Diagnosis not present

## 2017-04-19 DIAGNOSIS — H3562 Retinal hemorrhage, left eye: Secondary | ICD-10-CM | POA: Diagnosis not present

## 2017-04-19 DIAGNOSIS — H353221 Exudative age-related macular degeneration, left eye, with active choroidal neovascularization: Secondary | ICD-10-CM | POA: Diagnosis not present

## 2017-04-19 DIAGNOSIS — H35722 Serous detachment of retinal pigment epithelium, left eye: Secondary | ICD-10-CM | POA: Diagnosis not present

## 2017-04-19 DIAGNOSIS — H353211 Exudative age-related macular degeneration, right eye, with active choroidal neovascularization: Secondary | ICD-10-CM | POA: Diagnosis not present

## 2017-04-19 DIAGNOSIS — Z85038 Personal history of other malignant neoplasm of large intestine: Secondary | ICD-10-CM

## 2017-04-19 MED ORDER — NA SULFATE-K SULFATE-MG SULF 17.5-3.13-1.6 GM/177ML PO SOLN: 1.0000 | Freq: Once | ORAL | 0 refills | Status: AC

## 2017-04-19 NOTE — Telephone Encounter (Signed)
Discussed with Dr. Hilarie Fredrickson -- PV vs. OV. Dr. Hilarie Fredrickson is okay with patient being direct to hospital from [facility] if cardiology can guide [place] in regards to Xarelto. Dr. Hilarie Fredrickson is very familiar with patient. Mr. Tozzi denies any chest pain, SOB or nitroglycerin use. He states he feels well. Will request Dottie, CMA to contact cardiology for Xarelto instruction.

## 2017-04-19 NOTE — Progress Notes (Signed)
Discussed with Dr. Hilarie Fredrickson PV vs. OV. Dr. Hilarie Fredrickson is okay with patient being direct to hospital from [facility] if cardiology can guide [place] in regards to Xarelto. Dr. Hilarie Fredrickson if very familiar with patient. Mark Clayton denies any chest pain, SOB or nitroglycerin use. He states he feels well. Will request Dottie, CMA to contact cardiology for Xarelto instruction.

## 2017-04-19 NOTE — Telephone Encounter (Signed)
My apologies Dottie---clearance from cardiology and Xarelto clearance have already been received.

## 2017-04-19 NOTE — Telephone Encounter (Deleted)
Per 04/02/17 telephone note, patient was already cleared to hold Xarelto. Dr Hilarie Fredrickson, is this urgent colonoscopy?

## 2017-04-26 ENCOUNTER — Ambulatory Visit (INDEPENDENT_AMBULATORY_CARE_PROVIDER_SITE_OTHER): Payer: Medicare Other

## 2017-04-26 DIAGNOSIS — I5022 Chronic systolic (congestive) heart failure: Secondary | ICD-10-CM

## 2017-04-26 DIAGNOSIS — Z95810 Presence of automatic (implantable) cardiac defibrillator: Secondary | ICD-10-CM

## 2017-04-26 NOTE — Progress Notes (Signed)
EPIC Encounter for ICM Monitoring  Patient Name: Mark Clayton is a 77 y.o. male Date: 04/26/2017 Primary Care Physican: [name], MD Primary Cardiologist: Stanford Breed Electrophysiologist: Caryl Comes Dry Weight:158lbs      Heart Failure questions reviewed, pt asymptomatic.  He is scheduled for colonoscopy on 05/03/2017.   Thoracic impedance normal.  Prescribed dosage: Furosemide 40 mg 1 tablet daily  Recommendations: No changes.  Encouraged to call for fluid symptoms.  Follow-up plan: ICM clinic phone appointment on 05/27/2017.    Copy of ICM check sent to Dr. Caryl Comes.   3 month ICM trend: 04/26/2017    1 Year ICM trend:       Rosalene Billings, RN 04/26/2017 3:51 PM

## 2017-05-03 ENCOUNTER — Ambulatory Visit (HOSPITAL_COMMUNITY): Payer: Medicare Other | Admitting: Certified Registered Nurse Anesthetist

## 2017-05-03 ENCOUNTER — Ambulatory Visit (HOSPITAL_COMMUNITY)
Admission: RE | Admit: 2017-05-03 | Discharge: 2017-05-03 | Disposition: A | Discharge status: Home / Self Care | Payer: Medicare Other | Source: Ambulatory Visit | Attending: Internal Medicine | Admitting: Internal Medicine

## 2017-05-03 ENCOUNTER — Other Ambulatory Visit: Payer: Self-pay

## 2017-05-03 ENCOUNTER — Encounter (HOSPITAL_COMMUNITY): Payer: Self-pay

## 2017-05-03 ENCOUNTER — Encounter (HOSPITAL_COMMUNITY)
Admission: RE | Disposition: A | Discharge status: Home / Self Care | Payer: Self-pay | Source: Ambulatory Visit | Attending: Internal Medicine

## 2017-05-03 DIAGNOSIS — Z86010 Personal history of colonic polyps: Secondary | ICD-10-CM | POA: Diagnosis not present

## 2017-05-03 DIAGNOSIS — I252 Old myocardial infarction: Secondary | ICD-10-CM | POA: Diagnosis not present

## 2017-05-03 DIAGNOSIS — Z79899 Other long term (current) drug therapy: Secondary | ICD-10-CM | POA: Diagnosis not present

## 2017-05-03 DIAGNOSIS — Z95810 Presence of automatic (implantable) cardiac defibrillator: Secondary | ICD-10-CM | POA: Diagnosis not present

## 2017-05-03 DIAGNOSIS — I110 Hypertensive heart disease with heart failure: Secondary | ICD-10-CM | POA: Insufficient documentation

## 2017-05-03 DIAGNOSIS — Z85038 Personal history of other malignant neoplasm of large intestine: Secondary | ICD-10-CM | POA: Diagnosis not present

## 2017-05-03 DIAGNOSIS — D649 Anemia, unspecified: Secondary | ICD-10-CM | POA: Diagnosis not present

## 2017-05-03 DIAGNOSIS — Z1211 Encounter for screening for malignant neoplasm of colon: Secondary | ICD-10-CM | POA: Diagnosis not present

## 2017-05-03 DIAGNOSIS — Z980 Intestinal bypass and anastomosis status: Secondary | ICD-10-CM | POA: Diagnosis not present

## 2017-05-03 DIAGNOSIS — E785 Hyperlipidemia, unspecified: Secondary | ICD-10-CM | POA: Diagnosis not present

## 2017-05-03 DIAGNOSIS — Z7901 Long term (current) use of anticoagulants: Secondary | ICD-10-CM | POA: Insufficient documentation

## 2017-05-03 DIAGNOSIS — K648 Other hemorrhoids: Secondary | ICD-10-CM | POA: Insufficient documentation

## 2017-05-03 DIAGNOSIS — Z955 Presence of coronary angioplasty implant and graft: Secondary | ICD-10-CM | POA: Diagnosis not present

## 2017-05-03 DIAGNOSIS — Z888 Allergy status to other drugs, medicaments and biological substances status: Secondary | ICD-10-CM | POA: Insufficient documentation

## 2017-05-03 DIAGNOSIS — Z951 Presence of aortocoronary bypass graft: Secondary | ICD-10-CM | POA: Insufficient documentation

## 2017-05-03 DIAGNOSIS — D122 Benign neoplasm of ascending colon: Secondary | ICD-10-CM | POA: Diagnosis not present

## 2017-05-03 DIAGNOSIS — I2510 Atherosclerotic heart disease of native coronary artery without angina pectoris: Secondary | ICD-10-CM | POA: Insufficient documentation

## 2017-05-03 DIAGNOSIS — I5022 Chronic systolic (congestive) heart failure: Secondary | ICD-10-CM | POA: Insufficient documentation

## 2017-05-03 DIAGNOSIS — K219 Gastro-esophageal reflux disease without esophagitis: Secondary | ICD-10-CM | POA: Diagnosis not present

## 2017-05-03 HISTORY — PX: COLONOSCOPY WITH PROPOFOL: SHX5780

## 2017-05-03 SURGERY — COLONOSCOPY WITH PROPOFOL
Anesthesia: Monitor Anesthesia Care

## 2017-05-03 MED ORDER — PROPOFOL 500 MG/50ML IV EMUL
INTRAVENOUS | Status: DC | PRN
  Administered 2017-05-03: 100 ug/kg/min via INTRAVENOUS

## 2017-05-03 MED ORDER — SODIUM CHLORIDE 0.9 % IV SOLN: INTRAVENOUS | Status: DC

## 2017-05-03 MED ORDER — LACTATED RINGERS IV SOLN
INTRAVENOUS | Status: DC
Start: 2017-05-03 — End: 2017-05-03
  Administered 2017-05-03: 1000 mL via INTRAVENOUS
  Administered 2017-05-03: 10:00:00 via INTRAVENOUS

## 2017-05-03 MED ORDER — PROPOFOL 10 MG/ML IV BOLUS
INTRAVENOUS | Status: DC | PRN
  Administered 2017-05-03: 30 mg via INTRAVENOUS

## 2017-05-03 MED ORDER — PROPOFOL 10 MG/ML IV BOLUS
INTRAVENOUS | Status: AC
  Filled 2017-05-03: qty 60

## 2017-05-03 SURGICAL SUPPLY — 21 items

## 2017-05-03 NOTE — Discharge Instructions (Signed)

## 2017-05-03 NOTE — Anesthesia Postprocedure Evaluation (Signed)
Anesthesia Post Note  Patient: Mark Clayton  Procedure(s) Performed: COLONOSCOPY WITH PROPOFOL (N/A )     Patient location during evaluation: Endoscopy Anesthesia Type: MAC Level of consciousness: awake Pain management: pain level controlled Vital Signs Assessment: post-procedure vital signs reviewed and stable Respiratory status: spontaneous breathing Cardiovascular status: stable Anesthetic complications: no    Last Vitals:  Vitals:   05/03/17 1100 05/03/17 1110  BP: (!) 125/41 118/68  Pulse: (!) 47 (!) 39  Resp: 15 19  Temp:    SpO2: 99% 99%    Last Pain:  Vitals:   05/03/17 1056  TempSrc: Oral  PainSc:                  Derin Matthes

## 2017-05-03 NOTE — Transfer of Care (Signed)
Immediate Anesthesia Transfer of Care Note  Patient: Mark Clayton  Procedure(s) Performed: COLONOSCOPY WITH PROPOFOL (N/A )  Patient Location: PACU and Endoscopy Unit  Anesthesia Type:MAC  Level of Consciousness: awake, alert  and oriented  Airway & Oxygen Therapy: Patient Spontanous Breathing  Post-op Assessment: Report given to RN and Post -op Vital signs reviewed and stable  Post vital signs: Reviewed and stable  Last Vitals:  Vitals:   05/03/17 1003  BP: 134/60  Pulse: (!) 47  Resp: 14  Temp: 36.5 C  SpO2: 100%    Last Pain:  Vitals:   05/03/17 1003  TempSrc: Oral  PainSc: 3          Complications: No apparent anesthesia complications

## 2017-05-03 NOTE — Anesthesia Preprocedure Evaluation (Signed)
Anesthesia Evaluation  Patient identified by MRN, date of birth, ID band Patient awake    Reviewed: Allergy & Precautions, NPO status , Patient's Chart, lab work & pertinent test results  Airway Mallampati: II  TM Distance: >3 FB     Dental   Pulmonary    breath sounds clear to auscultation       Cardiovascular hypertension, + angina + CAD, + Past MI and +CHF  + Cardiac Defibrillator  Rhythm:Regular Rate:Normal     Neuro/Psych    GI/Hepatic Neg liver ROS, GERD  ,  Endo/Other  negative endocrine ROS  Renal/GU negative Renal ROS     Musculoskeletal  (+) Arthritis ,   Abdominal   Peds  Hematology  (+) anemia ,   Anesthesia Other Findings   Reproductive/Obstetrics                             Anesthesia Physical Anesthesia Plan  ASA: IV  Anesthesia Plan: MAC   Post-op Pain Management:    Induction: Intravenous  PONV Risk Score and Plan: Treatment may vary due to age or medical condition  Airway Management Planned: Nasal Cannula and Simple Face Mask  Additional Equipment:   Intra-op Plan:   Post-operative Plan:   Informed Consent: I have reviewed the patients History and Physical, chart, labs and discussed the procedure including the risks, benefits and alternatives for the proposed anesthesia with the patient or authorized representative who has indicated his/her understanding and acceptance.   Dental advisory given  Plan Discussed with: CRNA and Anesthesiologist  Anesthesia Plan Comments:         Anesthesia Quick Evaluation

## 2017-05-03 NOTE — H&P (Signed)
HPI: Mark Clayton is a 77 year old male with a past medical history of colon cancer and multiple colon polyps status post 2 prior segmental colon resections, history of ischemic cardiomyopathy on Xarelto, history of AICD placement, hypertension, hyperlipidemia who presents for surveillance colonoscopy.  His last colonoscopy was 3 years ago.  He reports he has been doing well.  He denies abdominal pain.  No issues with his bowel movement.  No diarrhea, constipation, blood in his stool or melena.  Appetite has been good.  He has no upper GI or hepatobiliary complaint.  He has been off of his Xarelto for the last 48 hours under direction of his prescribing provider.  He tolerated the prep well  Past Medical History:  Diagnosis Date  . AICD (automatic cardioverter/defibrillator) present 01/17/2003   Medtronic Maximo 7232CX ICD, serial [id number]  . Anemia 02-06-11   takes oral iron  . Arthritis    hands, knees  . CAD (coronary artery disease) 2003   a. h/o MI and CABG in 2003. b. s/p DES to SVG-RPDA-RPLB in 08/2014.  Marland Kitchen Cancer of sigmoid colon ([place]) 2012   a. s/p colon surgery.  . Carotid bruit   . Chronic systolic CHF (congestive heart failure) (HCC)    a. EF 20% in 2014.  Marland Kitchen Cough   . GERD (gastroesophageal reflux disease) 02-06-11  . HTN (hypertension)   . Hyperlipidemia   . Ischemic cardiomyopathy    a. EF 20% in 2014. (Master study EF >20%)  . LV (left ventricular) mural thrombus    a. Last echo 12/2012 EF 20% with mural thrombus, mod-severe left atrial enlargement. Per Dr. Olin Pia notes from that time, echo in 2009 demonstrated something similar - he had been on warfarin following prior MI and this was stopped after a number of months. Given lack of data regarding anticoagulation from chronic clot, he has not been on anticoagulation since.  . Macular degeneration   . Myocardial infarct [facility])    2003    Past Surgical History:  Procedure Laterality Date  . CARDIAC CATHETERIZATION  N/A 09/20/2014   Procedure: Left Heart Cath and Coronary Angiography;  Surgeon: Jettie Booze, MD; LAD 95%, D1 100%, CFX liner percent, OM 200%, OM 390%, RCA 90%, LIMA-LAD okay, SVG-OM 2-OM 3 minimal disease, SVG-RPDA-RPLB 100% between the RPDA and RPL     . CARDIAC CATHETERIZATION N/A 09/20/2014   Procedure: Coronary Stent Intervention;  Surgeon: Jettie Booze, MD; Synergy DES 4 x 24 mm reducing the stenosis to 5%   . CARDIAC DEFIBRILLATOR PLACEMENT  01/17/03   6949 lead. Medtronic. remote-no; with later revision  . CARDIOVERSION N/A 05/22/2016   Procedure: CARDIOVERSION;  Surgeon: Lelon Perla, MD;  Location: [facility] ENDOSCOPY;  Service: Cardiovascular;  Laterality: N/A;  . CATARACT EXTRACTION W/ INTRAOCULAR LENS  IMPLANT, BILATERAL Bilateral June/-July 2009   Dr. Katy Fitch  . COLON RESECTION  02/09/2011   Procedure: LAPAROSCOPIC SIGMOID COLON RESECTION;  Surgeon: Pedro Earls, MD;  Location: [facility] ORS;  Service: General;  Laterality: N/A;  Laparoscopic Assisted Sigmoid Colectomy  . COLON SURGERY    . COLONOSCOPY  08/31/2011   Procedure: COLONOSCOPY;  Surgeon: Jerene Bears, MD;  Location: [facility] ENDOSCOPY;  Service: Gastroenterology;  Laterality: N/A;  . COLONOSCOPY N/A 09/05/2012   Procedure: COLONOSCOPY;  Surgeon: Jerene Bears, MD;  Location: [facility] ENDOSCOPY;  Service: Gastroenterology;  Laterality: N/A;  . COLONOSCOPY N/A 04/18/2013   Procedure: COLONOSCOPY;  Surgeon: Jerene Bears, MD;  Location: [facility] ENDOSCOPY;  Service: Gastroenterology;  Laterality: N/A;  . COLONOSCOPY N/A 04/09/2014   Procedure: COLONOSCOPY;  Surgeon: Jerene Bears, MD;  Location: [facility] ENDOSCOPY;  Service: Gastroenterology;  Laterality: N/A;  . CORONARY ANGIOPLASTY    . CORONARY ARTERY BYPASS GRAFT  01/2002   LIMA-LAD, SVG-OM 2-OM 3, SVG-RPDA-RPLB  . ICD GENERATOR CHANGE  2010   Medtronic Virtuoso II VR ICD  . INGUINAL HERNIA REPAIR Right 2000's X 2  . LAPAROSCOPIC RIGHT HEMI COLECTOMY N/A 11/04/2012   Procedure: LAPAROSCOPIC  RIGHT HEMI COLECTOMY;  Surgeon: Pedro Earls, MD;  Location: [facility] ORS;  Service: General;  Laterality: N/A;  . TONSILLECTOMY  ~ 1950     (Not in an outpatient encounter)  Allergies  Allergen Reactions  . Latex Rash  . Tape Rash and Other (See Comments)    USE PAPER    Family History  Problem Relation Age of Onset  . Hypertension Father   . Hyperlipidemia Father   . Heart disease Father   . Prostate cancer Father   . Alzheimer's disease Mother   . Hypertension Sister   . Hyperlipidemia Sister   . Colon cancer Neg Hx   . Esophageal cancer Neg Hx   . Rectal cancer Neg Hx   . Stomach cancer Neg Hx     Social History   Tobacco Use  . Smoking status: Never Smoker  . Smokeless tobacco: Never Used  Substance Use Topics  . Alcohol use: Yes    Alcohol/week: 1.2 oz    Types: 2 Cans of beer per week  . Drug use: No    ROS: As per history of present illness, otherwise negative  BP 134/60   Pulse (!) 47   Temp 97.7 F (36.5 C) (Oral)   Resp 14   Ht 6' (1.829 m)   Wt 165 lb (74.8 kg)   SpO2 100%   BMI 22.38 kg/m  Gen: awake, alert, NAD HEENT: anicteric, op clear CV: RRR, no mrg Pulm: CTA b/l Abd: soft, NT/ND, +BS throughout Ext: no c/c/e Neuro: nonfocal   RELEVANT LABS AND IMAGING: CBC    Component Value Date/Time   WBC 5.5 12/07/2016 1116   RBC 4.64 12/07/2016 1116   HGB 12.8 (L) 12/07/2016 1116   HGB 10.9 (L) 11/11/2016 1527   HCT 39.8 12/07/2016 1116   HCT 34.3 (L) 11/11/2016 1527   PLT 193.0 12/07/2016 1116   PLT 178 11/11/2016 1527   MCV 85.8 12/07/2016 1116   MCV 84 11/11/2016 1527   MCH 26.5 (L) 11/11/2016 1527   MCH 24.1 (L) 09/21/2014 0303   MCHC 32.1 12/07/2016 1116   RDW 17.8 (H) 12/07/2016 1116   RDW 16.4 (H) 11/11/2016 1527   LYMPHSABS 1.5 05/11/2016 1042   MONOABS 0.7 10/05/2014 1213   EOSABS 0.2 05/11/2016 1042   BASOSABS 0.0 05/11/2016 1042    CMP     Component Value Date/Time   NA 141 01/15/2017 0840   NA 144 10/02/2016  1350   K 4.5 01/15/2017 0840   CL 103 01/15/2017 0840   CO2 30 01/15/2017 0840   GLUCOSE 107 (H) 01/15/2017 0840   BUN 10 01/15/2017 0840   BUN 18 10/02/2016 1350   CREATININE 0.97 01/15/2017 0840   CALCIUM 9.5 01/15/2017 0840   PROT 6.6 01/15/2017 0840   ALBUMIN 4.1 01/15/2017 0840   AST 17 01/15/2017 0840   ALT 10 01/15/2017 0840   ALKPHOS 42 01/15/2017 0840   BILITOT 0.9 01/15/2017 0840   GFRNONAA 64 10/02/2016 1350   GFRAA  74 10/02/2016 1350    ASSESSMENT/PLAN: 77 year old male with a past medical history of colon cancer and multiple colon polyps status post 2 prior segmental colon resections, history of ischemic cardiomyopathy on Xarelto, history of AICD placement, hypertension, hyperlipidemia who presents for surveillance colonoscopy.  1.  Personal history of colon cancer/personal history of multiple colon polyps --for surveillance colonoscopy today.  His Xarelto has been held for 48 hours. The nature of the procedure, as well as the risks, benefits, and alternatives were carefully and thoroughly reviewed with the patient. Ample time for discussion and questions allowed. The patient understood, was satisfied, and agreed to proceed.

## 2017-05-03 NOTE — Op Note (Signed)
[facility] Patient Name: Mark Clayton Procedure Date: 05/03/2017 MRN: [id number] Attending MD: Jerene Bears , MD Date of Birth: 1941-02-03 CSN: [id number] Age: 77 Admit Type: Outpatient Procedure:                Colonoscopy Indications:              High risk colon cancer surveillance: Personal                            history of colonic polyps, High risk colon cancer                            surveillance: Personal history of colon cancer,                            Last colonoscopy 3 years ago Providers:                Lajuan Lines. Hilarie Fredrickson, MD, Burtis Junes, RNMarilyn Everhart,                            Technician, Cletis Athens, Technician, Stephanie                            [place] CRNA Referring MD:             Real Cons. Crawford MD, MD Medicines:                Monitored Anesthesia Care Complications:            No immediate complications. Estimated Blood Loss:     Estimated blood loss was minimal. Procedure:                Pre-Anesthesia Assessment:                           - Prior to the procedure, a History and Physical                            was performed, and patient medications and                            allergies were reviewed. The patient's tolerance of                            previous anesthesia was also reviewed. The risks                            and benefits of the procedure and the sedation                            options and risks were discussed with the patient.                            All questions were answered, and informed consent  was obtained. Prior Anticoagulants: The patient has                            taken Xarelto (rivaroxaban), last dose was 2 days                            prior to procedure. ASA Grade Assessment: III - A                            patient with severe systemic disease. After                            reviewing the risks and benefits, the patient was        deemed in satisfactory condition to undergo the                            procedure.                           After obtaining informed consent, the colonoscope                            was passed under direct vision. Throughout the                            procedure, the patient's blood pressure, pulse, and                            oxygen saturations were monitored continuously. The                            EC-[id number] ([id number]) scope was introduced through                            the anus and advanced to the the ileocolonic                            anastomosis. The colonoscopy was performed with                            ease. The quality of the bowel preparation was                            good. The terminal ileum, ileocecal valve,                            appendiceal orifice, and rectum were photographed.                            The patient tolerated the procedure well. Scope In: 10:31:12 AM Scope Out: 10:46:54 AM Total Procedure Duration: 0 hours 15 minutes 42 seconds  Findings:      The digital rectal exam was normal.      There was evidence of a prior end-to-side ileo-colonic anastomosis  in       the ascending colon. This was patent and was characterized by healthy       appearing mucosa.      There was evidence of a prior end-to-end colo-colonic anastomosis in the       sigmoid colon. This was patent and was characterized by healthy       appearing mucosa.      The neo-terminal ileum appeared normal.      A 5 mm polyp was found in the ascending colon. The polyp was sessile.       The polyp was removed with a cold snare. Resection and retrieval were       complete.      Internal hemorrhoids were found during retroflexion. The hemorrhoids       were small. Impression:               - Patent end-to-side ileo-colonic anastomosis,                            characterized by healthy appearing mucosa.                           - Patent end-to-end  colo-colonic anastomosis,                            characterized by healthy appearing mucosa.                           - The examined portion of the ileum was normal.                           - One 5 mm polyp in the ascending colon, removed                            with a cold snare. Resected and retrieved.                           - Internal hemorrhoids. Moderate Sedation:      N/A Recommendation:           - Patient has a contact number available for                            emergencies. The signs and symptoms of potential                            delayed complications were discussed with the                            patient. Return to normal activities tomorrow.                            Written discharge instructions were provided to the                            patient.                           -  Resume previous diet.                           - Continue present medications.                           - Resume Xarelto (rivaroxaban) at prior dose today.                            Refer to managing physician for further adjustment                            of therapy.                           - Await pathology results.                           - Repeat colonoscopy in 3 years for surveillance. Procedure Code(s):        --- Professional ---                           [phone], Colonoscopy, flexible; with removal of                            tumor(s), polyp(s), or other lesion(s) by snare                            technique Diagnosis Code(s):        --- Professional ---                           Z86.010, Personal history of colonic polyps                           Z85.038, Personal history of other malignant                            neoplasm of large intestine                           Z98.0, Intestinal bypass and anastomosis status                           K64.8, Other hemorrhoids                           D12.2, Benign neoplasm of ascending colon CPT copyright  2016 American Medical Association. All rights reserved. The codes documented in this report are preliminary and upon coder review may  be revised to meet current compliance requirements. Jerene Bears, MD 05/03/2017 10:52:44 AM This report has been signed electronically. Number of Addenda: 0

## 2017-05-04 ENCOUNTER — Encounter: Payer: Self-pay | Admitting: Internal Medicine

## 2017-05-04 ENCOUNTER — Encounter (HOSPITAL_COMMUNITY): Payer: Self-pay | Admitting: Internal Medicine

## 2017-05-08 ENCOUNTER — Other Ambulatory Visit: Payer: Self-pay | Admitting: Internal Medicine

## 2017-05-20 ENCOUNTER — Other Ambulatory Visit: Payer: Self-pay | Admitting: Internal Medicine

## 2017-05-24 DIAGNOSIS — H353221 Exudative age-related macular degeneration, left eye, with active choroidal neovascularization: Secondary | ICD-10-CM | POA: Diagnosis not present

## 2017-05-24 DIAGNOSIS — H3562 Retinal hemorrhage, left eye: Secondary | ICD-10-CM | POA: Diagnosis not present

## 2017-05-27 ENCOUNTER — Ambulatory Visit (INDEPENDENT_AMBULATORY_CARE_PROVIDER_SITE_OTHER): Payer: Medicare Other | Admitting: *Deleted

## 2017-05-27 DIAGNOSIS — I255 Ischemic cardiomyopathy: Secondary | ICD-10-CM | POA: Diagnosis not present

## 2017-05-27 DIAGNOSIS — I5022 Chronic systolic (congestive) heart failure: Secondary | ICD-10-CM | POA: Diagnosis not present

## 2017-05-27 DIAGNOSIS — Z95810 Presence of automatic (implantable) cardiac defibrillator: Secondary | ICD-10-CM

## 2017-05-27 NOTE — Progress Notes (Signed)
Remote ICD transmission.   

## 2017-05-28 ENCOUNTER — Encounter: Payer: Self-pay | Admitting: Cardiology

## 2017-05-28 NOTE — Progress Notes (Signed)
EPIC Encounter for ICM Monitoring  Patient Name: Mark Clayton is a 77 y.o. male Date: 05/28/2017 Primary Care Physican: [name], MD Primary Cardiologist: Stanford Breed Electrophysiologist: Caryl Comes Dry Weight:160lbs           Heart Failure questions reviewed, pt asymptomatic.   Thoracic impedance normal.  Prescribed dosage: Furosemide 40 mg 1 tablet daily  Recommendations: No changes.   Encouraged to call for fluid symptoms.  Follow-up plan: ICM clinic phone appointment on 06/28/2017.    Copy of ICM check sent to Dr. Caryl Comes.  3 month ICM trend: 05/27/2017    1 Year ICM trend:       Rosalene Billings, RN 05/28/2017 12:27 PM

## 2017-06-12 LAB — CUP PACEART REMOTE DEVICE CHECK
Battery Voltage: 2.72 V
Brady Statistic RV Percent Paced: 0.9 %
Date Time Interrogation Session: 20190228083528
HighPow Impedance: 43 Ohm
HighPow Impedance: 53 Ohm
Implantable Lead Implant Date: 20041020
Implantable Lead Implant Date: 20100913
Implantable Lead Location: 753860
Implantable Lead Location: 753860
Implantable Lead Model: 5076
Implantable Lead Model: 6949
Implantable Pulse Generator Implant Date: 20100913
Lead Channel Impedance Value: 494 Ohm
Lead Channel Sensing Intrinsic Amplitude: 31.625 mV
Lead Channel Sensing Intrinsic Amplitude: 31.625 mV
Lead Channel Setting Pacing Amplitude: 3 V
Lead Channel Setting Pacing Pulse Width: 0.8 ms
Lead Channel Setting Sensing Sensitivity: 0.45 mV

## 2017-06-13 ENCOUNTER — Other Ambulatory Visit: Payer: Self-pay | Admitting: Internal Medicine

## 2017-06-13 DIAGNOSIS — Z95810 Presence of automatic (implantable) cardiac defibrillator: Secondary | ICD-10-CM

## 2017-06-13 DIAGNOSIS — I255 Ischemic cardiomyopathy: Secondary | ICD-10-CM

## 2017-06-13 DIAGNOSIS — I5022 Chronic systolic (congestive) heart failure: Secondary | ICD-10-CM

## 2017-06-14 NOTE — Telephone Encounter (Signed)
Pt saw Dr Caryl Comes 11/11/2016. Wt 01/25/17 was 76.2Kg. SCr from 01/15/2017 was 0.97. CrCL is 33mL/min. Will refill Xarelto 20mg  QD.

## 2017-06-21 DIAGNOSIS — H353122 Nonexudative age-related macular degeneration, left eye, intermediate dry stage: Secondary | ICD-10-CM | POA: Diagnosis not present

## 2017-06-21 DIAGNOSIS — H35722 Serous detachment of retinal pigment epithelium, left eye: Secondary | ICD-10-CM | POA: Diagnosis not present

## 2017-06-21 DIAGNOSIS — H353221 Exudative age-related macular degeneration, left eye, with active choroidal neovascularization: Secondary | ICD-10-CM | POA: Diagnosis not present

## 2017-06-28 ENCOUNTER — Telehealth: Payer: Self-pay

## 2017-06-28 ENCOUNTER — Ambulatory Visit (INDEPENDENT_AMBULATORY_CARE_PROVIDER_SITE_OTHER): Payer: Medicare Other

## 2017-06-28 DIAGNOSIS — Z95810 Presence of automatic (implantable) cardiac defibrillator: Secondary | ICD-10-CM

## 2017-06-28 DIAGNOSIS — I5022 Chronic systolic (congestive) heart failure: Secondary | ICD-10-CM

## 2017-06-28 NOTE — Progress Notes (Signed)
EPIC Encounter for ICM Monitoring  Patient Name: Mark Clayton is a 76 y.o. male Date: 06/28/2017 Primary Care Physican: [name], MD Primary Cardiologist: Stanford Breed Electrophysiologist: Faustino Congress Weight:Previous weight 160lbs      Attempted call to patient and unable to reach.  Left message to return call regarding transmission.  Transmission reviewed.    Thoracic impedance abnormal suggesting fluid accumulation .  Prescribed dosage: Furosemide 40 mg 1 tablet daily  Recommendations: NONE - Unable to reach.  Follow-up plan: ICM clinic phone appointment on 07/02/2017 to recheck fluid levels.    Copy of ICM check sent to Dr. Caryl Comes and Dr. Stanford Breed.   3 month ICM trend: 06/28/2017    1 Year ICM trend:       Rosalene Billings, RN 06/28/2017 2:23 PM

## 2017-06-28 NOTE — Telephone Encounter (Signed)
Remote ICM transmission received.  Attempted call to patient and left message per DPR to return call regarding transmission.  

## 2017-07-02 ENCOUNTER — Ambulatory Visit (INDEPENDENT_AMBULATORY_CARE_PROVIDER_SITE_OTHER): Payer: Self-pay

## 2017-07-02 ENCOUNTER — Telehealth: Payer: Self-pay

## 2017-07-02 DIAGNOSIS — Z95810 Presence of automatic (implantable) cardiac defibrillator: Secondary | ICD-10-CM

## 2017-07-02 DIAGNOSIS — I5022 Chronic systolic (congestive) heart failure: Secondary | ICD-10-CM

## 2017-07-02 NOTE — Telephone Encounter (Signed)
Remote ICM transmission received.  Attempted call to patient and left detailed message per DPR regarding transmission and next ICM scheduled for 07/29/2017.  Advised to return call for any fluid symptoms or questions.

## 2017-07-02 NOTE — Progress Notes (Signed)
161

## 2017-07-02 NOTE — Progress Notes (Signed)
Patient returned call.  He reported he was not able to take Furosemide for a couple of days due to the loss of his dog.  He gained 3 lbs initially but today the weight decreased by 2 lbs.  Today's weight 161 lbs.  No changes today.  He is back on track with his medication.  Advised to call for any fluid symptoms.

## 2017-07-02 NOTE — Progress Notes (Signed)
EPIC Encounter for ICM Monitoring  Patient Name: Mark Clayton is a 77 y.o. male Date: 07/02/2017 Primary Care Physican: [name], MD Primary Cardiologist: Stanford Breed Electrophysiologist: Faustino Congress Weight:Previous weight 160lbs   Attempted call to patient and unable to reach.  Left detailed message regarding transmission.  Transmission reviewed.    Thoracic impedance returned to normal since 06/28/2017 ICM Remote transmission.  Prescribed dosage: Furosemide 40 mg 1 tablet daily  Recommendations: Left voice mail with ICM number and encouraged to call if experiencing any fluid symptoms.  Follow-up plan: ICM clinic phone appointment on 07/29/2017.    Copy of ICM check sent to Dr. Caryl Comes.   3 month ICM trend: 07/02/2017    1 Year ICM trend:       Rosalene Billings, RN 07/02/2017 8:40 AM

## 2017-07-19 ENCOUNTER — Other Ambulatory Visit (INDEPENDENT_AMBULATORY_CARE_PROVIDER_SITE_OTHER): Payer: Medicare Other

## 2017-07-19 ENCOUNTER — Encounter: Payer: Self-pay | Admitting: Internal Medicine

## 2017-07-19 ENCOUNTER — Ambulatory Visit (INDEPENDENT_AMBULATORY_CARE_PROVIDER_SITE_OTHER): Payer: Medicare Other | Admitting: Internal Medicine

## 2017-07-19 VITALS — BP 120/70 | HR 50 | Temp 97.7°F | Ht 72.0 in | Wt 164.0 lb

## 2017-07-19 DIAGNOSIS — R0789 Other chest pain: Secondary | ICD-10-CM

## 2017-07-19 LAB — COMPREHENSIVE METABOLIC PANEL
ALT: 10 U/L (ref 0–53)
AST: 16 U/L (ref 0–37)
Albumin: 3.9 g/dL (ref 3.5–5.2)
Alkaline Phosphatase: 45 U/L (ref 39–117)
BUN: 10 mg/dL (ref 6–23)
CO2: 29 mEq/L (ref 19–32)
Calcium: 9.3 mg/dL (ref 8.4–10.5)
Chloride: 104 mEq/L (ref 96–112)
Creatinine, Ser: 1.08 mg/dL (ref 0.40–1.50)
GFR: 70.54 mL/min (ref >60.00)
Glucose, Bld: 107 mg/dL — ABNORMAL HIGH (ref 70–99)
Potassium: 4.2 mEq/L (ref 3.5–5.1)
Sodium: 141 mEq/L (ref 135–145)
Total Bilirubin: 0.9 mg/dL (ref 0.2–1.2)
Total Protein: 6.7 g/dL (ref 6.0–8.3)

## 2017-07-19 LAB — TROPONIN I: TNIDX: 0.03 ug/l (ref 0.00–0.06)

## 2017-07-19 LAB — TSH: TSH: 4.93 u[IU]/mL — ABNORMAL HIGH (ref 0.35–4.50)

## 2017-07-19 LAB — CBC
HCT: 37 % — ABNORMAL LOW (ref 39.0–52.0)
Hemoglobin: 12.2 g/dL — ABNORMAL LOW (ref 13.0–17.0)
MCHC: 33.1 g/dL (ref 30.0–36.0)
MCV: 85 fl (ref 78.0–100.0)
Platelets: 164 10*3/uL (ref 150.0–400.0)
RBC: 4.36 Mil/uL (ref 4.22–5.81)
RDW: 15.2 % (ref 11.5–15.5)
WBC: 5.6 10*3/uL (ref 4.0–10.5)

## 2017-07-19 LAB — VITAMIN B12: Vitamin B-12: 500 pg/mL (ref 211–911)

## 2017-07-19 LAB — BRAIN NATRIURETIC PEPTIDE: Pro B Natriuretic peptide (BNP): 704 pg/mL — ABNORMAL HIGH (ref 0.0–100.0)

## 2017-07-19 NOTE — Progress Notes (Signed)
   Subjective:    Patient ID: Mark Clayton, male    DOB: 01-12-1941, 77 y.o.   MRN: [id number]  HPI The patient is a 77 YO man coming in for chest pressure for the last several weeks. He is still walking but gets tired and has to rest after about 200 feet. He is barely able to make it down to the mailbox. He has taken 4-5 nitro in the last several weeks which is not usual for him. He is also feeling tired lately. He denies pain into his jaw or arms. He denies numbness in his arms. He is still sleeping well. Denies depression. No changes to meds and is taking as prescribed.   Review of Systems  Constitutional: Positive for fatigue. Negative for activity change, appetite change and unexpected weight change.  HENT: Negative.   Eyes: Negative.   Respiratory: Positive for chest tightness and shortness of breath. Negative for cough.   Cardiovascular: Negative for chest pain, palpitations and leg swelling.  Gastrointestinal: Negative for abdominal distention, abdominal pain, constipation, diarrhea, nausea and vomiting.  Musculoskeletal: Negative.   Skin: Negative.   Neurological: Negative.   Psychiatric/Behavioral: Negative.       Objective:   Physical Exam  Constitutional: He is oriented to person, place, and time. He appears well-developed and well-nourished.  HENT:  Head: Normocephalic and atraumatic.  Eyes: EOM are normal.  Neck: Normal range of motion.  Cardiovascular: Normal rate and regular rhythm.  Pulmonary/Chest: Effort normal and breath sounds normal. No respiratory distress. He has no wheezes. He has no rales.  Abdominal: Soft. Bowel sounds are normal. He exhibits no distension. There is no tenderness. There is no rebound.  Musculoskeletal: He exhibits no edema.  Neurological: He is alert and oriented to person, place, and time. Coordination normal.  Skin: Skin is warm and dry.   Vitals:   07/19/17 0819  BP: 120/70  Pulse: (!) 50  Temp: 97.7 F (36.5 C)  TempSrc: Oral    SpO2: 97%  Weight: 164 lb (74.4 kg)  Height: 6' (1.829 m)   EKG: Rate 56, LBBB, axis normal, intervals normal, sinus, no st or t wave changes from prior 2018.    Assessment & Plan:

## 2017-07-19 NOTE — Patient Instructions (Signed)
The EKG of the heart does not look changed from the last one.   We would like you to call the cardiologist to get back in for the chest pressure to get checked with a stress test or catheterization.   We are checking the labs today to see if there is another cause of the tiredness and chest pressure.

## 2017-07-20 DIAGNOSIS — R0789 Other chest pain: Secondary | ICD-10-CM | POA: Insufficient documentation

## 2017-07-20 NOTE — Assessment & Plan Note (Signed)
Checking BNP, troponin, EKG done today without changes. Checking CBC, TSH, B12 today to rule out other causes of the chest tightness and fatigue. We have asked him to contact his cardiologist about getting back in for this anginal equivalent.

## 2017-07-23 ENCOUNTER — Ambulatory Visit: Payer: Medicare Other | Admitting: Physician Assistant

## 2017-07-25 NOTE — Progress Notes (Signed)
Cardiology Office Note Date:  07/28/2017  Patient ID:  Helmer, Dull May 21, 1940, MRN [id number] PCP:  [name], MD  Cardiologist:  None Electrophysiologist: Dr. Caryl Comes    Chief Complaint: c/o fatigue/SOB with walking  History of Present Illness: Mark Clayton is a 77 y.o. male with history of severe CAD CABG 2003,  Coronary-wise: Last cardiac catheterization performed on 09/20/2014 following abnormal myoview showed native 3 vessel disease with 90% RCA, patent LIMA to LAD and SVG to OM 2/OM 3, patent SVG to RPDA but its sequential graft to RPLB were occluded. The native RCA had a 90% stenosis, this was treated with drug-eluting stent. He was seen by Kennon Portela for possible anginal symptoms in 05/02/2015, he was placed on Imdur with consideration of repeat cardiac cath if symptom persisted.  He was seen by Estrella Myrtle, PA in July last year, had discussed recent extra lasix at that time with resolved DOE, though mentioned a chronic component of exertional intolerance as well. No CP was elicited, and no new w/u planned. Given his significant CAD, was referred to Dr. Stanford Breed.  He has ICM w/ICD, PAFib, chronic anemia, chronic CHF (systolic), colon ca s/p colectomy, HTN, HLD, Hc of LV thrombus in 2014 (apparently with simlar echo findings in 2009 by chart hx).  He comes in today to be seen for Dr. Caryl Comes.  Last seen by him Aug 2018, at that visit he was s/p DCCV and had f/u in the AFib clinic as well, was maintaining SR and referred to [facility].  I see he was scheduled to see Dr. Stanford Breed in Oct, though does not appear he was seen  He was seen in the ER yesterday for weakness and reports of low BP at home, vitals in the ER note normotensive readings, and perhaps was dehydrated, feeling well after fluid replacement.  His labs were without significant abnormalities, ER MD note reports device was checked without any events noted as well.  He remained asymptomatic, able to ambulate  without symptoms/difficulty and discharged from the ER.  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  He denies any CP, palpitations or SOB, just felt very weak when he got up yesterday and persistently had low BP readings by his home cuff.  He once thought he may fall he felt so weak, at that point he sought attention.  Outside of this he has been feeling well, and today he feels "completely back to normal".  He states he was told yesterday he was in AFib, but by the time he left the ER was feeling very well and in AF today still feels well without any cardiac awareness.  He reports over the years for a "long time" he will occassionally need a s/l NTG with some chest heaviness, though has not had the need for this in several weeks.  He reports he went to the office to see Dr. Stanford Breed to get established with cardiology but felt uncomfortable in that initially there was some suggestion that he needed medicine changes.  (I do not see a visit in his record) so he left.  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  Does not f eel like he has any significant limitations physically, denies any  significant DOE, no symptoms of PND or orthopnea, mentions Margarita Grizzle (ICM clinic RN) keeps him in line.  He report compliance with his Eliquis, "I never miss a dose" and specifically in regards to his AF and possible DCCV, not in the last 3 weeks.  NO bleeding or signs of bleeding  Device information: MDTsingle chamber ICD,  original implant (8563 lead was 2004), gen change and new RV lead, 12/10/08   Past Medical History:  Diagnosis Date  . AICD (automatic cardioverter/defibrillator) present 01/17/2003   Medtronic Maximo 7232CX ICD, serial [id number]  . Anemia 02-06-11   takes oral iron  . Arthritis    hands, knees  . CAD (coronary artery  disease) 2003   a. h/o MI and CABG in 2003. b. s/p DES to SVG-RPDA-RPLB in 08/2014.  Marland Kitchen Cancer of sigmoid colon ([place]) 2012   a. s/p colon surgery.  . Carotid bruit   . Chronic systolic CHF (congestive heart failure) (HCC)    a. EF 20% in 2014.  Marland Kitchen Cough   . GERD (gastroesophageal reflux disease) 02-06-11  . HTN (hypertension)   . Hyperlipidemia   . Ischemic cardiomyopathy    a. EF 20% in 2014. (Master study EF >20%)  . LV (left ventricular) mural thrombus    a. Last echo 12/2012 EF 20% with mural thrombus, mod-severe left atrial enlargement. Per Dr. Olin Pia notes from that time, echo in 2009 demonstrated something similar - he had been on warfarin following prior MI and this was stopped after a number of months. Given lack of data regarding anticoagulation from chronic clot, he has not been on anticoagulation since.  . Macular degeneration   . Myocardial infarct [place])    2003    Past Surgical History:  Procedure Laterality Date  . CARDIAC CATHETERIZATION N/A 09/20/2014   Procedure: Left Heart Cath and Coronary Angiography;  Surgeon: Jettie Booze, MD; LAD 95%, D1 100%, CFX liner percent, OM 200%, OM 390%, RCA 90%, LIMA-LAD okay, SVG-OM 2-OM 3 minimal disease, SVG-RPDA-RPLB 100% between the RPDA and RPL     . CARDIAC CATHETERIZATION N/A 09/20/2014   Procedure: Coronary Stent Intervention;  Surgeon: Jettie Booze, MD; Synergy DES 4 x 24 mm reducing the stenosis to 5%   . CARDIAC DEFIBRILLATOR PLACEMENT  01/17/03   6949 lead. Medtronic. remote-no; with later revision  . CARDIOVERSION N/A 05/22/2016   Procedure: CARDIOVERSION;  Surgeon: Lelon Perla, MD;  Location: [facility] ENDOSCOPY;  Service: Cardiovascular;  Laterality: N/A;  . CATARACT EXTRACTION W/ INTRAOCULAR LENS  IMPLANT, BILATERAL Bilateral June/-July 2009   Dr. Katy Fitch  . COLON RESECTION  02/09/2011   Procedure: LAPAROSCOPIC SIGMOID COLON RESECTION;  Surgeon: Pedro Earls, MD;  Location: [facility] ORS;  Service: General;   Laterality: N/A;  Laparoscopic Assisted Sigmoid Colectomy  . COLON SURGERY    . COLONOSCOPY  08/31/2011   Procedure: COLONOSCOPY;  Surgeon: Jerene Bears, MD;  Location: [facility] ENDOSCOPY;  Service: Gastroenterology;  Laterality: N/A;  . COLONOSCOPY N/A 09/05/2012   Procedure: COLONOSCOPY;  Surgeon: Jerene Bears, MD;  Location: [facility] ENDOSCOPY;  Service: Gastroenterology;  Laterality: N/A;  . COLONOSCOPY N/A 04/18/2013   Procedure: COLONOSCOPY;  Surgeon: Jerene Bears, MD;  Location: [facility] ENDOSCOPY;  Service: Gastroenterology;  Laterality: N/A;  . COLONOSCOPY N/A 04/09/2014   Procedure: COLONOSCOPY;  Surgeon: Jerene Bears, MD;  Location: [facility] ENDOSCOPY;  Service: Gastroenterology;  Laterality: N/A;  . COLONOSCOPY WITH PROPOFOL N/A 05/03/2017   Procedure: COLONOSCOPY WITH PROPOFOL;  Surgeon: Jerene Bears,  MD;  Location: [facility] ENDOSCOPY;  Service: Gastroenterology;  Laterality: N/A;  . CORONARY ANGIOPLASTY    . CORONARY ARTERY BYPASS GRAFT  01/2002   LIMA-LAD, SVG-OM 2-OM 3, SVG-RPDA-RPLB  . ICD GENERATOR CHANGE  2010   Medtronic Virtuoso II VR ICD  . INGUINAL HERNIA REPAIR Right 2000's X 2  . LAPAROSCOPIC RIGHT HEMI COLECTOMY N/A 11/04/2012   Procedure: LAPAROSCOPIC RIGHT HEMI COLECTOMY;  Surgeon: Pedro Earls, MD;  Location: [facility] ORS;  Service: General;  Laterality: N/A;  . TONSILLECTOMY  ~ 1950    Current Outpatient Medications  Medication Sig Dispense Refill  . carvedilol (COREG) 25 MG tablet TAKE 1 TABLET BY MOUTH AT  BEDTIME 90 tablet 1  . furosemide (LASIX) 40 MG tablet TAKE 1 TABLET BY MOUTH  DAILY 90 tablet 1  . isosorbide mononitrate (IMDUR) 30 MG 24 hr tablet TAKE 1 TABLET BY MOUTH  DAILY (Patient taking differently: Take 30 mg by mouth at bedtime) 90 tablet 2  . losartan (COZAAR) 50 MG tablet TAKE 1 TABLET BY MOUTH  DAILY 90 tablet 1  . meclizine (ANTIVERT) 25 MG tablet TAKE 1 TABLET BY MOUTH  DAILY AS NEEDED FOR  DIZZINESS. 60 tablet 1  . nitroGLYCERIN (NITROSTAT) 0.4 MG SL tablet DISSOLVE 1 TABLET  UNDER THE TONGUE EVERY 5 MINUTES AS  NEEDED FOR CHEST PAIN UP TO 3 DOSES. CALL 911 IF CHEST  PAIN PERSISTS 25 tablet 0  . pantoprazole (PROTONIX) 40 MG tablet Take 1 tablet (40 mg total) by mouth daily. 90 tablet 3  . Phenylephrine-Acetaminophen (TYLENOL SINUS CONGESTION/PAIN PO) Take 1 tablet by mouth daily as needed (pain).    Vladimir Faster Glycol-Propyl Glycol (SYSTANE ULTRA) 0.4-0.3 % SOLN Place 1 drop into both eyes at bedtime.    . pravastatin (PRAVACHOL) 20 MG tablet Take 1 tablet (20 mg total) by mouth at bedtime. 90 tablet 3  . PRESCRIPTION MEDICATION Inject 1 each as directed every 5 (five) weeks. Eye Injection every 5 weeks.     . rivaroxaban (XARELTO) 20 MG TABS tablet Take 1 tablet (20 mg total) by mouth daily with supper. 30 tablet 3  . VITAMIN K PO Take 1 tablet by mouth daily.    Alveda Reasons 20 MG TABS tablet TAKE 1 TABLET BY MOUTH  DAILY WITH SUPPER 90 tablet 1   No current facility-administered medications for this visit.     Allergies:   Latex and Tape   Social History:  The patient  reports that he has never smoked. He has never used smokeless tobacco. He reports that he drinks about 1.2 oz of alcohol per week. He reports that he does not use drugs.   Family History:  The patient's family history includes Alzheimer's disease in his mother; Heart disease in his father; Hyperlipidemia in his father and sister; Hypertension in his father and sister; Prostate cancer in his father.  ROS:  Please see the history of present illness.  All other systems are reviewed and otherwise negative.   PHYSICAL EXAM:  VS:  BP 128/68   Pulse 64   Ht 6' (1.829 m)   Wt 167 lb (75.8 kg)   SpO2 98%   BMI 22.65 kg/m  BMI: Body mass index is 22.65 kg/m. Well nourished, well developed, in no acute distress  HEENT: normocephalic, atraumatic  Neck: no JVD, carotid bruits or masses Cardiac: iRRR; no significant murmurs, no rubs, or gallops Lungs: CTA b/l, no wheezing, rhonchi or rales  Abd:  soft, nontender MS: no deformity,  age appropriate atrophy Ext:  no edema  Skin: warm and dry, no rash Neuro:  No gross deficits appreciated Psych: euthymic mood, full affect   ICD site is stable, no tethering or discomfort   EKG:   Reviewed by myself: Done yesterday in ER was AFib, LBBB, 63bpm 07/19/17: SB, 56bpm, LBBB  ICD interrogation done today and reviewed by myself: Battery voltage is OK at 2.66V, lead measurements are good, no device observations.   Myoview 08/31/2014 Study Highlights  Abnormal study. Severe dilatation of the ventricle with diffuse hypokinesis. Ejection fraction is 20%. The study suggests a large area of scar with slight peri-infarct ischemia. This is a high risk scan    Cath 09/20/2014 Conclusion   Severe native three-vessel coronary artery disease. Patent LIMA to LAD. Patent jump graft SVG to OM 2 and OM 3. Patent SVG to PDA. The second portion of this graft which goes to the posterior lateral artery is occluded.  90% distal RCA stenosis which supplies the posterior lateral territory. Successful 4.0 x 24 Synergy drug-eluting stent placement to the distal right coronary artery, postdilated to 5 mm in diameter..  Continue dual antiplatelets therapy for at least a year. Continue aggressive secondary prevention. He'll be watched overnight. Possible discharge tomorrow.    Echo 01/27/2013 LV EF: 20% Study Conclusions - Left ventricle: The cavity size was mildly dilated. Wall thickness was increased in a pattern of mild LVH. Systolic function was severely reduced. The estimated ejection fraction was 20%. Microbubble contrast was employed for better endocardial definition. There is a well-defined mural thrombus, approximately 1 x 1.5 cm, at the apical inferior septal wall. There is akinesis of the mid-distalinferolateral and inferior myocardium. There is akinesis of the mid-distalanteroseptal and apical myocardium. Consistent with  ischemic cardiomyopathy. Doppler parameters are consistent with restrictive physiology, indicative of decreased left ventricular diastolic compliance and/or increased left atrial pressure. - Aortic valve: Mildly calcified annulus. Trileaflet; mildly thickened leaflets. No significant regurgitation. - Mitral valve: Calcified annulus. Mildly thickened leaflets . Mild regurgitation. - Left atrium: The atrium was moderately to severely dilated. - Right ventricle: Pacer wire or catheter noted in right ventricle. Systolic function was moderately reduced. - Right atrium: Central venous pressure: 59mm Hg (est). - Tricuspid valve: Mild regurgitation. - Pulmonary arteries: Systolic pressure could not be accurately estimated. - Pericardium, extracardiac: There was no pericardial effusion. Impressions: - Comparison to prior study November 2011. Mild LVH with mild chamber dilatation and LVEF approximately 20% - reduced from prior assessment. Wall motion abnormalities as described above consistent with ischemic cardiomyopathy. There is evidence of a mural thrombus noted at the apical inferior septal wall measuring approximately 1 x 1.5 cm, well-defined by micro-bubble contrast. Diastolic filling pattern is restrictive with increased pressures. Moderate to severe left atrial enlargement. Mildly thickened mitral leaflets with mild mitral regurgitation. Sclerotic aortic valve without stenosis. Device wire noted within the right heart. RVcontraction is moderately reduced. Mild tricuspid regurgitation. Unable to assess PASP. CVP estimated at 8 mm mercury.      Recent Labs: 07/19/2017: ALT 10; Pro B Natriuretic peptide (BNP) 704.0; TSH 4.93 07/27/2017: BUN 14; Creatinine, Ser 1.01; Hemoglobin 11.7; Platelets 160; Potassium 3.5; Sodium 139  01/15/2017: Cholesterol 185; HDL 52.60; LDL Cholesterol 112; Total CHOL/HDL Ratio 4; Triglycerides 100.0;  VLDL 20.0   Estimated Creatinine Clearance: 66.7 mL/min (by C-G formula based on SCr of 1.01 mg/dL).   Wt Readings from Last 3 Encounters:  07/28/17 167 lb (75.8 kg)  07/27/17 159 lb (72.1 kg)  07/19/17  164 lb (74.4 kg)     Other studies reviewed: Additional studies/records reviewed today include: summarized above  ASSESSMENT AND PLAN:  1. ICD     intact function  2. ICM     Weight is stable, no exam findings to suggest fludi OL, OptiVOl looks good     On BB/ARB, furosemide  3. CAD     No anginal symptoms described today, sounds at his baseline     Not on ASA w/Eliquis, on BB, statin tx  Discussed importance of dedicated cardiologist for his CAD, he would like to see someone, will refer him to Dr. Saunders Revel given his significant hx of CAD  4. HTN     Looks OK, no changes today  Yesterday with some hypotension reported associated with weakness, suspect to be 2/2 dehydration discussed importance of hydration balance  5. HLD     Will defer to interventional cardiology  6. Persistent AFib     CHA2DS2Vasc is at least 5, on Eliquis, appropirately dosed     He is back in AFib, though was in sinus 07/19/17.       He feels well today, unclear how much in the way of symptoms he has with it     Given LVEF 20% though he may benefit from DCCV  Discussed with the patient, he leaves for the beach early Friday, will hold off given feeling OK and have him see AF clinic next week, check his rhythm, and see how he is doing and discuss possible DCCV if still out of rhythm, he is reminded not to miss any Eliquis doses    Disposition: F/u with Dr. Saunders Revel as a new patient for his CAD, AFib clnic next week, and here in 3 mo, sooner if needed.     Current medicines are reviewed at length with the patient today.  The patient did not have any concerns regarding medicines.  Venetia Night, PA-C 07/28/2017 12:49 PM     [address] [place]  [id number] [phone] (office)  [phone] (fax)

## 2017-07-26 DIAGNOSIS — H353212 Exudative age-related macular degeneration, right eye, with inactive choroidal neovascularization: Secondary | ICD-10-CM | POA: Diagnosis not present

## 2017-07-26 DIAGNOSIS — H353122 Nonexudative age-related macular degeneration, left eye, intermediate dry stage: Secondary | ICD-10-CM | POA: Diagnosis not present

## 2017-07-26 DIAGNOSIS — H353221 Exudative age-related macular degeneration, left eye, with active choroidal neovascularization: Secondary | ICD-10-CM | POA: Diagnosis not present

## 2017-07-26 DIAGNOSIS — H35722 Serous detachment of retinal pigment epithelium, left eye: Secondary | ICD-10-CM | POA: Diagnosis not present

## 2017-07-27 ENCOUNTER — Emergency Department (HOSPITAL_COMMUNITY): Payer: Medicare Other

## 2017-07-27 ENCOUNTER — Other Ambulatory Visit: Payer: Self-pay

## 2017-07-27 ENCOUNTER — Encounter (HOSPITAL_COMMUNITY): Payer: Self-pay | Admitting: Emergency Medicine

## 2017-07-27 ENCOUNTER — Emergency Department (HOSPITAL_COMMUNITY)
Admission: EM | Admit: 2017-07-27 | Discharge: 2017-07-27 | Disposition: A | Discharge status: Home / Self Care | Payer: Medicare Other | Attending: Emergency Medicine | Admitting: Emergency Medicine

## 2017-07-27 DIAGNOSIS — R55 Syncope and collapse: Secondary | ICD-10-CM | POA: Insufficient documentation

## 2017-07-27 DIAGNOSIS — R531 Weakness: Secondary | ICD-10-CM | POA: Diagnosis not present

## 2017-07-27 DIAGNOSIS — I110 Hypertensive heart disease with heart failure: Secondary | ICD-10-CM | POA: Insufficient documentation

## 2017-07-27 DIAGNOSIS — I2510 Atherosclerotic heart disease of native coronary artery without angina pectoris: Secondary | ICD-10-CM | POA: Insufficient documentation

## 2017-07-27 DIAGNOSIS — Z951 Presence of aortocoronary bypass graft: Secondary | ICD-10-CM | POA: Diagnosis not present

## 2017-07-27 DIAGNOSIS — I5022 Chronic systolic (congestive) heart failure: Secondary | ICD-10-CM | POA: Insufficient documentation

## 2017-07-27 DIAGNOSIS — Z7902 Long term (current) use of antithrombotics/antiplatelets: Secondary | ICD-10-CM | POA: Insufficient documentation

## 2017-07-27 DIAGNOSIS — R404 Transient alteration of awareness: Secondary | ICD-10-CM | POA: Diagnosis not present

## 2017-07-27 DIAGNOSIS — I252 Old myocardial infarction: Secondary | ICD-10-CM | POA: Insufficient documentation

## 2017-07-27 DIAGNOSIS — Z79899 Other long term (current) drug therapy: Secondary | ICD-10-CM | POA: Diagnosis not present

## 2017-07-27 DIAGNOSIS — I4891 Unspecified atrial fibrillation: Secondary | ICD-10-CM

## 2017-07-27 DIAGNOSIS — I447 Left bundle-branch block, unspecified: Secondary | ICD-10-CM | POA: Diagnosis not present

## 2017-07-27 LAB — BASIC METABOLIC PANEL
Anion gap: 7 (ref 5–15)
BUN: 14 mg/dL (ref 6–20)
CO2: 25 mmol/L (ref 22–32)
Calcium: 8.9 mg/dL (ref 8.9–10.3)
Chloride: 107 mmol/L (ref 101–111)
Creatinine, Ser: 1.01 mg/dL (ref 0.61–1.24)
GFR calc Af Amer: >60 mL/min (ref >60)
GFR calc non Af Amer: >60 mL/min (ref >60)
Glucose, Bld: 94 mg/dL (ref 65–99)
Potassium: 3.5 mmol/L (ref 3.5–5.1)
Sodium: 139 mmol/L (ref 135–145)

## 2017-07-27 LAB — CBC
HCT: 36 % — ABNORMAL LOW (ref 39.0–52.0)
Hemoglobin: 11.7 g/dL — ABNORMAL LOW (ref 13.0–17.0)
MCH: 28.2 pg (ref 26.0–34.0)
MCHC: 32.5 g/dL (ref 30.0–36.0)
MCV: 86.7 fL (ref 78.0–100.0)
Platelets: 160 10*3/uL (ref 150–400)
RBC: 4.15 MIL/uL — ABNORMAL LOW (ref 4.22–5.81)
RDW: 14.4 % (ref 11.5–15.5)
WBC: 5.6 10*3/uL (ref 4.0–10.5)

## 2017-07-27 LAB — URINALYSIS, ROUTINE W REFLEX MICROSCOPIC
Bilirubin Urine: NEGATIVE
Glucose, UA: NEGATIVE mg/dL
Ketones, ur: NEGATIVE mg/dL
Leukocytes, UA: NEGATIVE
Nitrite: NEGATIVE
Protein, ur: NEGATIVE mg/dL
Specific Gravity, Urine: 1.01 (ref 1.005–1.030)
pH: 7 (ref 5.0–8.0)

## 2017-07-27 LAB — TROPONIN I: Troponin I: <0.03 ng/mL (ref <0.03)

## 2017-07-27 LAB — CBG MONITORING, ED: Glucose-Capillary: 97 mg/dL (ref 65–99)

## 2017-07-27 NOTE — Discharge Instructions (Addendum)
It was our pleasure to provide your ER care today - we hope that you feel better.  Rest. Drink adequate fluids.  Follow up with your cardiologist tomorrow as planned.  Return to ER if worse, new symptoms, fevers, chest pain, trouble breathing, weak/fainting, other concern.

## 2017-07-27 NOTE — ED Notes (Signed)
Pt ambulated to restroom without complication °

## 2017-07-27 NOTE — ED Notes (Signed)
Spoke with Mark Clayton in lab  States they will add the troponin to the blood in lab

## 2017-07-27 NOTE — ED Provider Notes (Signed)
[facility] DEPT Provider Note   CSN: [id number] Arrival date & time: 07/27/17  4782     History   Chief Complaint Chief Complaint  Patient presents with  . Weakness    HPI CAELLUM MANCIL is a 77 y.o. male.  HPI  This is a 77 year old male with a history of coronary artery disease, ischemic cardiomyopathy, anemia who presents with generalized weakness.  Patient reports that he woke up at 1 AM and felt dizzy.  He states that he felt lightheaded.  No room spinning dizziness.  He got up and drink some water and tea.  He felt like he may have been dehydrated.  He took his blood pressure and reports that it was 60 systolic.  He does take blood pressure medications.  Reports no recent changes in blood pressure medication.  Patient states he went back to bed.  He felt somewhat better but had some persistent lightheadedness.  Repeat blood pressure was in the 70s.  He woke his wife up.  He does report a recent cough.  Also reports sore throat.  No fevers.  He denies any chest pain or shortness of breath.  Denies any focal symptoms including speech difficulty, weakness, numbness, facial droop.   Past Medical History:  Diagnosis Date  . AICD (automatic cardioverter/defibrillator) present 01/17/2003   Medtronic Maximo 7232CX ICD, serial [id number]  . Anemia 02-06-11   takes oral iron  . Arthritis    hands, knees  . CAD (coronary artery disease) 2003   a. h/o MI and CABG in 2003. b. s/p DES to SVG-RPDA-RPLB in 08/2014.  Marland Kitchen Cancer of sigmoid colon ([place]) 2012   a. s/p colon surgery.  . Carotid bruit   . Chronic systolic CHF (congestive heart failure) (HCC)    a. EF 20% in 2014.  Marland Kitchen Cough   . GERD (gastroesophageal reflux disease) 02-06-11  . HTN (hypertension)   . Hyperlipidemia   . Ischemic cardiomyopathy    a. EF 20% in 2014. (Master study EF >20%)  . LV (left ventricular) mural thrombus    a. Last echo 12/2012 EF 20% with mural thrombus, mod-severe left  atrial enlargement. Per Dr. Olin Pia notes from that time, echo in 2009 demonstrated something similar - he had been on warfarin following prior MI and this was stopped after a number of months. Given lack of data regarding anticoagulation from chronic clot, he has not been on anticoagulation since.  . Macular degeneration   . Myocardial infarct [facility])    2003    Patient Active Problem List   Diagnosis Date Noted  . Chest pressure 07/20/2017  . Benign neoplasm of ascending colon   . Viral URI with cough 01/25/2017  . Idiopathic neuropathy 12/07/2016  . Routine general medical examination at a health care facility 01/10/2016  . Angina pectoris ([place]) 09/20/2014  . Cardiomyopathy, ischemic   . Personal history of colon cancer   . ICD -Medtronic 03/09/2012  . Adenocarcinoma of sigmoid colon Node positive 02/09/2011  . Microcytic anemia 01/12/2011  . Cardiac defibrillator  MDT VVI 12/26/2010  . AORTIC STENOSIS, MILD 04/29/2009  . Hyperlipidemia 12/06/2008  . Chronic systolic heart failure ([place]) 11/26/2008    Past Surgical History:  Procedure Laterality Date  . CARDIAC CATHETERIZATION N/A 09/20/2014   Procedure: Left Heart Cath and Coronary Angiography;  Surgeon: Jettie Booze, MD; LAD 95%, D1 100%, CFX liner percent, OM 200%, OM 390%, RCA 90%, LIMA-LAD okay, SVG-OM 2-OM 3 minimal disease, SVG-RPDA-RPLB 100% between  the RPDA and RPL     . CARDIAC CATHETERIZATION N/A 09/20/2014   Procedure: Coronary Stent Intervention;  Surgeon: Jettie Booze, MD; Synergy DES 4 x 24 mm reducing the stenosis to 5%   . CARDIAC DEFIBRILLATOR PLACEMENT  01/17/03   6949 lead. Medtronic. remote-no; with later revision  . CARDIOVERSION N/A 05/22/2016   Procedure: CARDIOVERSION;  Surgeon: Lelon Perla, MD;  Location: [facility] ENDOSCOPY;  Service: Cardiovascular;  Laterality: N/A;  . CATARACT EXTRACTION W/ INTRAOCULAR LENS  IMPLANT, BILATERAL Bilateral June/-July 2009   Dr. Katy Fitch  . COLON RESECTION   02/09/2011   Procedure: LAPAROSCOPIC SIGMOID COLON RESECTION;  Surgeon: Pedro Earls, MD;  Location: [facility] ORS;  Service: General;  Laterality: N/A;  Laparoscopic Assisted Sigmoid Colectomy  . COLON SURGERY    . COLONOSCOPY  08/31/2011   Procedure: COLONOSCOPY;  Surgeon: Jerene Bears, MD;  Location: [facility] ENDOSCOPY;  Service: Gastroenterology;  Laterality: N/A;  . COLONOSCOPY N/A 09/05/2012   Procedure: COLONOSCOPY;  Surgeon: Jerene Bears, MD;  Location: [facility] ENDOSCOPY;  Service: Gastroenterology;  Laterality: N/A;  . COLONOSCOPY N/A 04/18/2013   Procedure: COLONOSCOPY;  Surgeon: Jerene Bears, MD;  Location: [facility] ENDOSCOPY;  Service: Gastroenterology;  Laterality: N/A;  . COLONOSCOPY N/A 04/09/2014   Procedure: COLONOSCOPY;  Surgeon: Jerene Bears, MD;  Location: [facility] ENDOSCOPY;  Service: Gastroenterology;  Laterality: N/A;  . COLONOSCOPY WITH PROPOFOL N/A 05/03/2017   Procedure: COLONOSCOPY WITH PROPOFOL;  Surgeon: Jerene Bears, MD;  Location: [facility] ENDOSCOPY;  Service: Gastroenterology;  Laterality: N/A;  . CORONARY ANGIOPLASTY    . CORONARY ARTERY BYPASS GRAFT  01/2002   LIMA-LAD, SVG-OM 2-OM 3, SVG-RPDA-RPLB  . ICD GENERATOR CHANGE  2010   Medtronic Virtuoso II VR ICD  . INGUINAL HERNIA REPAIR Right 2000's X 2  . LAPAROSCOPIC RIGHT HEMI COLECTOMY N/A 11/04/2012   Procedure: LAPAROSCOPIC RIGHT HEMI COLECTOMY;  Surgeon: Pedro Earls, MD;  Location: [facility] ORS;  Service: General;  Laterality: N/A;  . TONSILLECTOMY  ~ 1950        Home Medications    Prior to Admission medications   Medication Sig Start Date End Date Taking? Authorizing Provider  carvedilol (COREG) 25 MG tablet TAKE 1 TABLET BY MOUTH AT  BEDTIME 05/20/17  Yes [name], MD  furosemide (LASIX) 40 MG tablet TAKE 1 TABLET BY MOUTH  DAILY 05/20/17  Yes [name], MD  isosorbide mononitrate (IMDUR) 30 MG 24 hr tablet TAKE 1 TABLET BY MOUTH  DAILY Patient taking differently: Take 30 mg by mouth at bedtime 03/24/17  Yes  Deboraha Sprang, MD  losartan (COZAAR) 50 MG tablet TAKE 1 TABLET BY MOUTH  DAILY 06/14/17  Yes [name], MD  pantoprazole (PROTONIX) 40 MG tablet Take 1 tablet (40 mg total) by mouth daily. 01/15/17  Yes [name], MD  Phenylephrine-Acetaminophen (TYLENOL SINUS CONGESTION/PAIN PO) Take 1 tablet by mouth daily as needed (pain).   Yes [provider]  Polyethyl Glycol-Propyl Glycol (SYSTANE ULTRA) 0.4-0.3 % SOLN Place 1 drop into both eyes at bedtime.   Yes [provider]  pravastatin (PRAVACHOL) 20 MG tablet Take 1 tablet (20 mg total) by mouth at bedtime. 01/15/17  Yes [name], MD  VITAMIN K PO Take 1 tablet by mouth daily.   Yes [provider]  XARELTO 20 MG TABS tablet TAKE 1 TABLET BY MOUTH  DAILY WITH SUPPER 06/14/17  Yes Deboraha Sprang, MD  meclizine (ANTIVERT) 25 MG  tablet TAKE 1 TABLET BY MOUTH  DAILY AS NEEDED FOR  DIZZINESS. 06/14/17   [name], MD  nitroGLYCERIN (NITROSTAT) 0.4 MG SL tablet DISSOLVE 1 TABLET UNDER THE TONGUE EVERY 5 MINUTES AS  NEEDED FOR CHEST PAIN UP TO 3 DOSES. CALL 911 IF CHEST  PAIN PERSISTS 06/14/17   [name], MD  PRESCRIPTION MEDICATION Inject 1 each as directed every 5 (five) weeks. Eye Injection every 5 weeks.     [provider]  rivaroxaban (XARELTO) 20 MG TABS tablet Take 1 tablet (20 mg total) by mouth daily with supper. Patient not taking: Reported on 07/27/2017 01/18/17   Sherran Needs, NP    Family History Family History  Problem Relation Age of Onset  . Hypertension Father   . Hyperlipidemia Father   . Heart disease Father   . Prostate cancer Father   . Alzheimer's disease Mother   . Hypertension Sister   . Hyperlipidemia Sister   . Colon cancer Neg Hx   . Esophageal cancer Neg Hx   . Rectal cancer Neg Hx   . Stomach cancer Neg Hx     Social History Social History   Tobacco Use  . Smoking status: Never Smoker  . Smokeless tobacco:  Never Used  Substance Use Topics  . Alcohol use: Yes    Alcohol/week: 1.2 oz    Types: 2 Cans of beer per week  . Drug use: No     Allergies   Latex and Tape   Review of Systems Review of Systems  Constitutional: Positive for diaphoresis and fatigue.  Respiratory: Positive for cough. Negative for chest tightness and shortness of breath.   Cardiovascular: Negative for chest pain.  Gastrointestinal: Negative for abdominal pain, nausea and vomiting.  Genitourinary: Negative for dysuria.  Neurological: Positive for dizziness and light-headedness. Negative for speech difficulty, weakness and numbness.  All other systems reviewed and are negative.    Physical Exam Updated Vital Signs BP 139/75   Pulse 64   Temp 97.8 F (36.6 C) (Oral)   Resp 13   Ht 6' (1.829 m)   Wt 72.1 kg (159 lb)   SpO2 100%   BMI 21.56 kg/m   Physical Exam  Constitutional: He is oriented to person, place, and time.  Elderly, chronically ill-appearing, no acute distress  HENT:  Head: Normocephalic and atraumatic.  Neck: Neck supple.  Cardiovascular: Normal rate and normal heart sounds.  No murmur heard. Irregular rhythm  Pulmonary/Chest: Effort normal and breath sounds normal. No respiratory distress. He has no wheezes.  Defibrillator palpated left upper chest  Abdominal: Soft. Bowel sounds are normal. There is no tenderness. There is no rebound.  Musculoskeletal: He exhibits no edema.  Lymphadenopathy:    He has no cervical adenopathy.  Neurological: He is alert and oriented to person, place, and time.  Fluent speech, cranial nerves II through XII intact, 5 out of 5 strength in all 4 extremities  Skin: Skin is warm and dry.  Psychiatric: He has a normal mood and affect.  Nursing note and vitals reviewed.    ED Treatments / Results  Labs (all labs ordered are listed, but only abnormal results are displayed) Labs Reviewed  CBC - Abnormal; Notable for the following components:      Result  Value   RBC 4.15 (*)    Hemoglobin 11.7 (*)    HCT 36.0 (*)    All other components within normal limits  BASIC METABOLIC PANEL  TROPONIN I  URINALYSIS, ROUTINE W REFLEX MICROSCOPIC  CBG MONITORING, ED    EKG EKG Interpretation  Date/Time:  Tuesday July 27 2017 06:17:04 EDT Ventricular Rate:  63 PR Interval:    QRS Duration: 174 QT Interval:  487 QTC Calculation: 499 R Axis:   74 Text Interpretation:  Atrial fibrillation IVCD, consider atypical LBBB Now in atrial fib otherwise no change Confirmed by Thayer Jew [phone]) on 07/27/2017 6:26:52 AM   Radiology Dg Chest 2 View  Result Date: 07/27/2017 CLINICAL DATA:  Atrial fibrillation.  Weakness. EXAM: CHEST - 2 VIEW COMPARISON:  September 25, 2016 FINDINGS: There is no edema or consolidation. Heart is mildly enlarged with pulmonary vascularity within normal limits. Pacemaker leads are attached the right atrium and right ventricle. Patient is status post coronary artery bypass grafting. There is aortic atherosclerosis. No evident adenopathy. There is mild degenerative change in the thoracic spine. IMPRESSION: Mild cardiomegaly with pacemaker leads attached right atrium and right ventricle. There is aortic atherosclerosis. No edema or consolidation. Aortic Atherosclerosis (ICD10-I70.0). Electronically Signed   By: Lowella Grip III M.D.   On: 07/27/2017 07:19    Procedures Procedures (including critical care time)  Medications Ordered in ED Medications - No data to display   Initial Impression / Assessment and Plan / ED Course  I have reviewed the triage vital signs and the nursing notes.  Pertinent labs & imaging results that were available during my care of the patient were reviewed by me and considered in my medical decision making (see chart for details).  Clinical Course as of Jul 28 746  Tue Jul 27, 2017  0739 Pacemaker interrogated.  No acute events this morning.   [CH]  6606 She reports he feels better than he  did this morning.  Work-up thus far is reassuring.  No acute arrhythmias.  Blood pressure has been stable.  Request urinalysis.  Will ambulate patient in hallway to obtain urine.   [CH]    Clinical Course User Index [CH] Vanilla Heatherington, Barbette Hair, MD    Patient presents with dizziness.  Reports this correlated with low blood pressures at home.  His initial vital signs are largely reassuring.  Blood pressure is normal.  He states he feels much better.  He does have an extensive cardiac history.  Denies any chest pain.  Would be very atypical for ACS.  He is nonfocal and doubt stroke.  Work-up initiated.  Lab work-up reassuring including negative troponin.  EKG without ischemia or arrhythmia.  No evidence of significant dehydration.  Patient does report that he hydrated aggressively this morning.  Pacemaker interrogation without any evidence of acute arrhythmias.  Will ambulate patient to hallway and have him provide a urine sample.  Given his history, would favor monitoring for short duration of time to ensure no recurrent hypotension.  He has no fever and no history of infectious symptoms.  Doubt sepsis.  Patient signed out to oncoming provider.  Final Clinical Impressions(s) / ED Diagnoses   Final diagnoses:  None    ED Discharge Orders    None       Lachrisha Ziebarth, Barbette Hair, MD 07/28/17 [phone]

## 2017-07-27 NOTE — ED Provider Notes (Signed)
Signed out by Dr Dina Rich that ua pending. If ua ok, and no new c/o, d/c to home, has f/u with his cardiologist this Wednesday.   Recheck pt - pt is awake, alert. Hr 66, rr 14, pulse ox 99%. Pt denies any chest pain or discomfort. No sob. States currently feels fine. Pt reports walking to bathroom in ED and feeling fine during that.  Po fluids. Check labs - ua is negative.   Patient currently asymptomatic, and appears stable for d/c.  Has appt with his cardiologist/APP tomorrow.   Pt currently appears stable for d/c.   Return precautions provided.     Lajean Saver, MD 07/27/17 [phone]

## 2017-07-28 ENCOUNTER — Encounter: Payer: Self-pay | Admitting: Physician Assistant

## 2017-07-28 ENCOUNTER — Ambulatory Visit: Payer: Medicare Other | Admitting: Physician Assistant

## 2017-07-28 VITALS — BP 128/68 | HR 64 | Ht 72.0 in | Wt 167.0 lb

## 2017-07-28 DIAGNOSIS — Z95810 Presence of automatic (implantable) cardiac defibrillator: Secondary | ICD-10-CM

## 2017-07-28 DIAGNOSIS — I480 Paroxysmal atrial fibrillation: Secondary | ICD-10-CM | POA: Diagnosis not present

## 2017-07-28 DIAGNOSIS — I10 Essential (primary) hypertension: Secondary | ICD-10-CM | POA: Diagnosis not present

## 2017-07-28 DIAGNOSIS — I255 Ischemic cardiomyopathy: Secondary | ICD-10-CM

## 2017-07-28 DIAGNOSIS — I2510 Atherosclerotic heart disease of native coronary artery without angina pectoris: Secondary | ICD-10-CM | POA: Diagnosis not present

## 2017-07-28 DIAGNOSIS — I5022 Chronic systolic (congestive) heart failure: Secondary | ICD-10-CM | POA: Diagnosis not present

## 2017-07-28 LAB — CUP PACEART INCLINIC DEVICE CHECK
Battery Voltage: 2.66 V
Brady Statistic RV Percent Paced: 0.54 %
Date Time Interrogation Session: 20190501124314
HighPow Impedance: 45 Ohm
HighPow Impedance: 63 Ohm
Implantable Lead Implant Date: 20041020
Implantable Lead Implant Date: 20100913
Implantable Lead Location: 753860
Implantable Lead Location: 753860
Implantable Lead Model: 5076
Implantable Lead Model: 6949
Implantable Pulse Generator Implant Date: 20100913
Lead Channel Impedance Value: 551 Ohm
Lead Channel Pacing Threshold Amplitude: 1.25 V
Lead Channel Pacing Threshold Pulse Width: 0.8 ms
Lead Channel Sensing Intrinsic Amplitude: 31.625 mV
Lead Channel Sensing Intrinsic Amplitude: 31.625 mV
Lead Channel Setting Pacing Amplitude: 3 V
Lead Channel Setting Pacing Pulse Width: 0.8 ms
Lead Channel Setting Sensing Sensitivity: 0.45 mV

## 2017-07-28 NOTE — Patient Instructions (Addendum)
Medication Instructions:   Your physician recommends that you continue on your current medications as directed. Please refer to the Current Medication list given to you today.   If you need a refill on your cardiac medications before your next appointment, please call your pharmacy.  Labwork: NONE ORDERED  TODAY    Testing/Procedures: NONE ORDERED  TODAY    Follow-Up:  3 MONTHS WITH URSUY  DR END NEW PT NEXT AVAILABLE APPOINTMENT FOR CAD   [facility] CLINIC NEXT WEEK Friday    Any Other Special Instructions Will Be Listed Below (If Applicable).

## 2017-07-29 ENCOUNTER — Ambulatory Visit (INDEPENDENT_AMBULATORY_CARE_PROVIDER_SITE_OTHER): Payer: Self-pay

## 2017-07-29 DIAGNOSIS — I5022 Chronic systolic (congestive) heart failure: Secondary | ICD-10-CM

## 2017-07-29 DIAGNOSIS — Z95810 Presence of automatic (implantable) cardiac defibrillator: Secondary | ICD-10-CM

## 2017-07-29 NOTE — Progress Notes (Signed)
EPIC Encounter for ICM Monitoring  Patient Name: Mark Clayton is a 77 y.o. male Date: 07/29/2017 Primary Care Physican: [name], MD Primary Cardiologist: Stanford Breed Electrophysiologist: Faustino Congress Weight:Previous [id number]       Attempted call to patient and unable to reach.  Left detailed message regarding transmission.  Transmission reviewed.    Thoracic impedance normal.   Patient had defib office check on 07/28/2017 for follow ER visit on 07/27/2017.   Prescribed dosage: Furosemide 40 mg 1 tablet daily  Labs: 07/27/2017 Creatinine 1.01, BUN 14, Potassium 3.5, Sodium 139 07/19/2017 Creatinine 1.08, BUN 10, Potassium 4.2, Sodium 141, EGFR 70.54  A complete set of results can be found in Results Review.  Recommendations: Left voice mail with ICM number and encouraged to call if experiencing any fluid symptoms.  Follow-up plan: ICM clinic phone appointment on 08/30/2017.    Copy of ICM check sent to Dr. Caryl Comes.   3 month ICM trend: 07/29/2017    1 Year ICM trend:       Rosalene Billings, RN 07/29/2017 10:49 AM

## 2017-08-06 ENCOUNTER — Encounter (HOSPITAL_COMMUNITY): Payer: Self-pay | Admitting: Nurse Practitioner

## 2017-08-06 ENCOUNTER — Ambulatory Visit (HOSPITAL_COMMUNITY)
Admission: RE | Admit: 2017-08-06 | Discharge: 2017-08-06 | Disposition: A | Discharge status: Home / Self Care | Payer: Medicare Other | Source: Ambulatory Visit | Attending: Nurse Practitioner | Admitting: Nurse Practitioner

## 2017-08-06 ENCOUNTER — Other Ambulatory Visit (HOSPITAL_COMMUNITY): Payer: Self-pay | Admitting: *Deleted

## 2017-08-06 VITALS — BP 140/68 | HR 69 | Ht 72.0 in | Wt 161.0 lb

## 2017-08-06 DIAGNOSIS — Z955 Presence of coronary angioplasty implant and graft: Secondary | ICD-10-CM | POA: Diagnosis not present

## 2017-08-06 DIAGNOSIS — H3530 Unspecified macular degeneration: Secondary | ICD-10-CM | POA: Diagnosis not present

## 2017-08-06 DIAGNOSIS — I110 Hypertensive heart disease with heart failure: Secondary | ICD-10-CM | POA: Diagnosis not present

## 2017-08-06 DIAGNOSIS — Z9841 Cataract extraction status, right eye: Secondary | ICD-10-CM | POA: Diagnosis not present

## 2017-08-06 DIAGNOSIS — K219 Gastro-esophageal reflux disease without esophagitis: Secondary | ICD-10-CM | POA: Diagnosis not present

## 2017-08-06 DIAGNOSIS — M1990 Unspecified osteoarthritis, unspecified site: Secondary | ICD-10-CM | POA: Diagnosis not present

## 2017-08-06 DIAGNOSIS — Z79899 Other long term (current) drug therapy: Secondary | ICD-10-CM | POA: Diagnosis not present

## 2017-08-06 DIAGNOSIS — Z95810 Presence of automatic (implantable) cardiac defibrillator: Secondary | ICD-10-CM | POA: Diagnosis not present

## 2017-08-06 DIAGNOSIS — D649 Anemia, unspecified: Secondary | ICD-10-CM | POA: Diagnosis not present

## 2017-08-06 DIAGNOSIS — Z951 Presence of aortocoronary bypass graft: Secondary | ICD-10-CM | POA: Insufficient documentation

## 2017-08-06 DIAGNOSIS — Z8249 Family history of ischemic heart disease and other diseases of the circulatory system: Secondary | ICD-10-CM | POA: Insufficient documentation

## 2017-08-06 DIAGNOSIS — I255 Ischemic cardiomyopathy: Secondary | ICD-10-CM | POA: Insufficient documentation

## 2017-08-06 DIAGNOSIS — Z7901 Long term (current) use of anticoagulants: Secondary | ICD-10-CM | POA: Insufficient documentation

## 2017-08-06 DIAGNOSIS — I2510 Atherosclerotic heart disease of native coronary artery without angina pectoris: Secondary | ICD-10-CM | POA: Diagnosis not present

## 2017-08-06 DIAGNOSIS — I480 Paroxysmal atrial fibrillation: Secondary | ICD-10-CM | POA: Diagnosis not present

## 2017-08-06 DIAGNOSIS — Z820 Family history of epilepsy and other diseases of the nervous system: Secondary | ICD-10-CM | POA: Diagnosis not present

## 2017-08-06 DIAGNOSIS — Z9049 Acquired absence of other specified parts of digestive tract: Secondary | ICD-10-CM | POA: Insufficient documentation

## 2017-08-06 DIAGNOSIS — E785 Hyperlipidemia, unspecified: Secondary | ICD-10-CM | POA: Insufficient documentation

## 2017-08-06 DIAGNOSIS — Z91040 Latex allergy status: Secondary | ICD-10-CM | POA: Diagnosis not present

## 2017-08-06 DIAGNOSIS — Z961 Presence of intraocular lens: Secondary | ICD-10-CM | POA: Diagnosis not present

## 2017-08-06 DIAGNOSIS — I252 Old myocardial infarction: Secondary | ICD-10-CM | POA: Diagnosis not present

## 2017-08-06 DIAGNOSIS — I5022 Chronic systolic (congestive) heart failure: Secondary | ICD-10-CM | POA: Diagnosis not present

## 2017-08-06 DIAGNOSIS — Z9842 Cataract extraction status, left eye: Secondary | ICD-10-CM | POA: Diagnosis not present

## 2017-08-06 DIAGNOSIS — Z85038 Personal history of other malignant neoplasm of large intestine: Secondary | ICD-10-CM | POA: Diagnosis not present

## 2017-08-06 DIAGNOSIS — Z8042 Family history of malignant neoplasm of prostate: Secondary | ICD-10-CM | POA: Insufficient documentation

## 2017-08-06 LAB — CBC
HCT: 36.1 % — ABNORMAL LOW (ref 39.0–52.0)
Hemoglobin: 11.5 g/dL — ABNORMAL LOW (ref 13.0–17.0)
MCH: 27.7 pg (ref 26.0–34.0)
MCHC: 31.9 g/dL (ref 30.0–36.0)
MCV: 87 fL (ref 78.0–100.0)
Platelets: 163 10*3/uL (ref 150–400)
RBC: 4.15 MIL/uL — ABNORMAL LOW (ref 4.22–5.81)
RDW: 14.4 % (ref 11.5–15.5)
WBC: 4.8 10*3/uL (ref 4.0–10.5)

## 2017-08-06 LAB — BASIC METABOLIC PANEL
Anion gap: 9 (ref 5–15)
BUN: 8 mg/dL (ref 6–20)
CO2: 25 mmol/L (ref 22–32)
Calcium: 9.4 mg/dL (ref 8.9–10.3)
Chloride: 106 mmol/L (ref 101–111)
Creatinine, Ser: 1.13 mg/dL (ref 0.61–1.24)
GFR calc Af Amer: >60 mL/min (ref >60)
GFR calc non Af Amer: >60 mL/min (ref >60)
Glucose, Bld: 145 mg/dL — ABNORMAL HIGH (ref 65–99)
Potassium: 3.9 mmol/L (ref 3.5–5.1)
Sodium: 140 mmol/L (ref 135–145)

## 2017-08-06 NOTE — Progress Notes (Signed)
Primary Care Physician: Mark Koch, MD Referring Physician: Dr. Jani Clayton Mark Clayton is a 77 y.o. male with a h/o ICD, CAD,EF 20% paroxsymal afib that is in the afib clinic for f/u of Mark Clayton, [place] visit, 5/1. He was in afib at that visit, in [place] when checked 07/19/17, and was not terribly symptomatic.He had plans to go to the beach but found he was symptomatic with climbing the steps and inclines at the beach, so cut his trip short.Marland Kitchen He is ok with flat surfaces.He has also had fatigue. Fluid status is stable.  Today, he denies symptoms of palpitations, chest pain, shortness of breath, orthopnea, PND, lower extremity edema, dizziness, presyncope, syncope, or neurologic sequela. The patient is tolerating medications without difficulties and is otherwise without complaint today.   Past Medical History:  Diagnosis Date  . AICD (automatic cardioverter/defibrillator) present 01/17/2003   Medtronic Maximo 7232CX ICD, serial [id number]  . Anemia 02-06-11   takes oral iron  . Arthritis    hands, knees  . CAD (coronary artery disease) 2003   a. h/o MI and CABG in 2003. b. s/p DES to SVG-RPDA-RPLB in 08/2014.  Marland Kitchen Cancer of sigmoid colon ([place]) 2012   a. s/p colon surgery.  . Carotid bruit   . Chronic systolic CHF (congestive heart failure) (HCC)    a. EF 20% in 2014.  Marland Kitchen Cough   . GERD (gastroesophageal reflux disease) 02-06-11  . HTN (hypertension)   . Hyperlipidemia   . Ischemic cardiomyopathy    a. EF 20% in 2014. (Master study EF >20%)  . LV (left ventricular) mural thrombus    a. Last echo 12/2012 EF 20% with mural thrombus, mod-severe left atrial enlargement. Per Dr. Olin Pia notes from that time, echo in 2009 demonstrated something similar - he had been on warfarin following prior MI and this was stopped after a number of months. Given lack of data regarding anticoagulation from chronic clot, he has not been on anticoagulation since.  . Macular degeneration   . Myocardial  infarct [facility])    2003   Past Surgical History:  Procedure Laterality Date  . CARDIAC CATHETERIZATION N/A 09/20/2014   Procedure: Left Heart Cath and Coronary Angiography;  Surgeon: Jettie Booze, MD; LAD 95%, D1 100%, CFX liner percent, OM 200%, OM 390%, RCA 90%, LIMA-LAD okay, SVG-OM 2-OM 3 minimal disease, SVG-RPDA-RPLB 100% between the RPDA and RPL     . CARDIAC CATHETERIZATION N/A 09/20/2014   Procedure: Coronary Stent Intervention;  Surgeon: Jettie Booze, MD; Synergy DES 4 x 24 mm reducing the stenosis to 5%   . CARDIAC DEFIBRILLATOR PLACEMENT  01/17/03   6949 lead. Medtronic. remote-no; with later revision  . CARDIOVERSION N/A 05/22/2016   Procedure: CARDIOVERSION;  Surgeon: Lelon Perla, MD;  [facility] ENDOSCOPY;  Service: Cardiovascular;  Laterality: N/A;  . CATARACT EXTRACTION W/ INTRAOCULAR LENS  IMPLANT, BILATERAL Bilateral June/-July 2009   Dr. Katy Fitch  . COLON RESECTION  02/09/2011   Procedure: LAPAROSCOPIC SIGMOID COLON RESECTION;  Surgeon: Pedro Earls, MD;  Location: [facility] ORS;  Service: General;  Laterality: N/A;  Laparoscopic Assisted Sigmoid Colectomy  . COLON SURGERY    . COLONOSCOPY  08/31/2011   Procedure: COLONOSCOPY;  Surgeon: Jerene Bears, MD;  Location: [facility] ENDOSCOPY;  Service: Gastroenterology;  Laterality: N/A;  . COLONOSCOPY N/A 09/05/2012   Procedure: COLONOSCOPY;  Surgeon: Jerene Bears, MD;  Location: [facility] ENDOSCOPY;  Service: Gastroenterology;  Laterality: N/A;  . COLONOSCOPY N/A 04/18/2013  Procedure: COLONOSCOPY;  Surgeon: Jerene Bears, MD;  Location: [facility] ENDOSCOPY;  Service: Gastroenterology;  Laterality: N/A;  . COLONOSCOPY N/A 04/09/2014   Procedure: COLONOSCOPY;  Surgeon: Jerene Bears, MD;  Location: [facility] ENDOSCOPY;  Service: Gastroenterology;  Laterality: N/A;  . COLONOSCOPY WITH PROPOFOL N/A 05/03/2017   Procedure: COLONOSCOPY WITH PROPOFOL;  Surgeon: Jerene Bears, MD;  Location: [facility] ENDOSCOPY;  Service: Gastroenterology;  Laterality: N/A;  .  CORONARY ANGIOPLASTY    . CORONARY ARTERY BYPASS GRAFT  01/2002   LIMA-LAD, SVG-OM 2-OM 3, SVG-RPDA-RPLB  . ICD GENERATOR CHANGE  2010   Medtronic Virtuoso II VR ICD  . INGUINAL HERNIA REPAIR Right 2000's X 2  . LAPAROSCOPIC RIGHT HEMI COLECTOMY N/A 11/04/2012   Procedure: LAPAROSCOPIC RIGHT HEMI COLECTOMY;  Surgeon: Pedro Earls, MD;  Location: [facility] ORS;  Service: General;  Laterality: N/A;  . TONSILLECTOMY  ~ 1950    Current Outpatient Medications  Medication Sig Dispense Refill  . carvedilol (COREG) 25 MG tablet TAKE 1 TABLET BY MOUTH AT  BEDTIME 90 tablet 1  . furosemide (LASIX) 40 MG tablet TAKE 1 TABLET BY MOUTH  DAILY 90 tablet 1  . isosorbide mononitrate (IMDUR) 30 MG 24 hr tablet TAKE 1 TABLET BY MOUTH  DAILY (Patient taking differently: Take 30 mg by mouth at bedtime) 90 tablet 2  . losartan (COZAAR) 50 MG tablet TAKE 1 TABLET BY MOUTH  DAILY 90 tablet 1  . meclizine (ANTIVERT) 25 MG tablet TAKE 1 TABLET BY MOUTH  DAILY AS NEEDED FOR  DIZZINESS. 60 tablet 1  . nitroGLYCERIN (NITROSTAT) 0.4 MG SL tablet DISSOLVE 1 TABLET UNDER THE TONGUE EVERY 5 MINUTES AS  NEEDED FOR CHEST PAIN UP TO 3 DOSES. CALL 911 IF CHEST  PAIN PERSISTS 25 tablet 0  . pantoprazole (PROTONIX) 40 MG tablet Take 1 tablet (40 mg total) by mouth daily. 90 tablet 3  . Polyethyl Glycol-Propyl Glycol (SYSTANE ULTRA) 0.4-0.3 % SOLN Place 1 drop into both eyes at bedtime.    . pravastatin (PRAVACHOL) 20 MG tablet Take 1 tablet (20 mg total) by mouth at bedtime. 90 tablet 3  . PRESCRIPTION MEDICATION Inject 1 each as directed every 5 (five) weeks. Eye Injection every 5 weeks.     . rivaroxaban (XARELTO) 20 MG TABS tablet Take 1 tablet (20 mg total) by mouth daily with supper. 30 tablet 3  . VITAMIN K PO Take 1 tablet by mouth daily.     No current facility-administered medications for this encounter.     Allergies  Allergen Reactions  . Latex Rash  . Tape Rash and Other (See Comments)    USE PAPER    Social  History   Socioeconomic History  . Marital status: Widowed    Spouse name: Not on file  . Number of children: 3  . Years of education: Not on file  . Highest education level: Not on file  Occupational History  . Occupation: self employed  Social Needs  . Financial resource strain: Not on file  . Food insecurity:    Worry: Not on file    Inability: Not on file  . Transportation needs:    Medical: Not on file    Non-medical: Not on file  Tobacco Use  . Smoking status: Never Smoker  . Smokeless tobacco: Never Used  Substance and Sexual Activity  . Alcohol use: Yes    Alcohol/week: 1.2 oz    Types: 2 Cans of beer per week  . Drug use:  No  . Sexual activity: Yes  Lifestyle  . Physical activity:    Days per week: Not on file    Minutes per session: Not on file  . Stress: Not on file  Relationships  . Social connections:    Talks on phone: Not on file    Gets together: Not on file    Attends religious service: Not on file    Active member of club or organization: Not on file    Attends meetings of clubs or organizations: Not on file    Relationship status: Not on file  . Intimate partner violence:    Fear of current or ex partner: Not on file    Emotionally abused: Not on file    Physically abused: Not on file    Forced sexual activity: Not on file  Other Topics Concern  . Not on file  Social History Narrative   Full time. Married.     Family History  Problem Relation Age of Onset  . Hypertension Father   . Hyperlipidemia Father   . Heart disease Father   . Prostate cancer Father   . Alzheimer's disease Mother   . Hypertension Sister   . Hyperlipidemia Sister   . Colon cancer Neg Hx   . Esophageal cancer Neg Hx   . Rectal cancer Neg Hx   . Stomach cancer Neg Hx     ROS- All systems are reviewed and negative except as per the HPI above  Physical Exam: Vitals:   08/06/17 0837  BP: 140/68  Pulse: 69  SpO2: 98%  Weight: 161 lb (73 kg)  Height: 6' (1.829  m)   Wt Readings from Last 3 Encounters:  08/06/17 161 lb (73 kg)  07/28/17 167 lb (75.8 kg)  07/27/17 159 lb (72.1 kg)    Labs: Lab Results  Component Value Date   NA 139 07/27/2017   K 3.5 07/27/2017   CL 107 07/27/2017   CO2 25 07/27/2017   GLUCOSE 94 07/27/2017   BUN 14 07/27/2017   CREATININE 1.01 07/27/2017   CALCIUM 8.9 07/27/2017   Lab Results  Component Value Date   INR 1.05 09/20/2014   Lab Results  Component Value Date   CHOL 185 01/15/2017   HDL 52.60 01/15/2017   LDLCALC 112 (H) 01/15/2017   TRIG 100.0 01/15/2017     GEN- The patient is well appearing, alert and oriented x 3 today.   Head- normocephalic, atraumatic Eyes-  Sclera clear, conjunctiva pink Ears- hearing intact Oropharynx- clear Neck- supple, no JVP Lymph- no cervical lymphadenopathy Lungs- Clear to ausculation bilaterally, normal work of breathing Heart- irregular rate and rhythm, no murmurs, rubs or gallops, PMI not laterally displaced GI- soft, NT, ND, + BS Extremities- no clubbing, cyanosis, or edema MS- no significant deformity or atrophy Skin- no rash or lesion Psych- euthymic mood, full affect Neuro- strength and sensation are intact  EKG- afib with LBBB at 69 bpm, qrs int 172 ms, qtc 497 ms Epic records reviewed Holter monitor reviewed    Assessment and Plan: 1. Afib Last cardioverion  05/22/16 Afib burden has been low Will schedule for cardioversion, first available  Continue xarelto 20 mg a day with CHA2DS2VASc score of at least 5, states no missed doses Continue carvedilol  2. CHF Weight stable Continue diuretic Avoid salt Daily weights   F/u in one week in afib clinic  Kycen Spalla C. Kristeena Meineke, [facility] [address] [place], [place] [address] [phone]

## 2017-08-10 ENCOUNTER — Ambulatory Visit (HOSPITAL_COMMUNITY): Payer: Medicare Other | Admitting: Certified Registered Nurse Anesthetist

## 2017-08-10 ENCOUNTER — Encounter (HOSPITAL_COMMUNITY): Payer: Self-pay | Admitting: *Deleted

## 2017-08-10 ENCOUNTER — Other Ambulatory Visit: Payer: Self-pay

## 2017-08-10 ENCOUNTER — Ambulatory Visit (HOSPITAL_COMMUNITY)
Admission: RE | Admit: 2017-08-10 | Discharge: 2017-08-10 | Disposition: A | Discharge status: Home / Self Care | Payer: Medicare Other | Source: Ambulatory Visit | Attending: Internal Medicine | Admitting: Internal Medicine

## 2017-08-10 ENCOUNTER — Encounter (HOSPITAL_COMMUNITY)
Admission: RE | Disposition: A | Discharge status: Home / Self Care | Payer: Self-pay | Source: Ambulatory Visit | Attending: Internal Medicine

## 2017-08-10 DIAGNOSIS — I4891 Unspecified atrial fibrillation: Secondary | ICD-10-CM | POA: Diagnosis present

## 2017-08-10 DIAGNOSIS — I481 Persistent atrial fibrillation: Secondary | ICD-10-CM | POA: Insufficient documentation

## 2017-08-10 DIAGNOSIS — I110 Hypertensive heart disease with heart failure: Secondary | ICD-10-CM | POA: Insufficient documentation

## 2017-08-10 DIAGNOSIS — Z955 Presence of coronary angioplasty implant and graft: Secondary | ICD-10-CM | POA: Diagnosis not present

## 2017-08-10 DIAGNOSIS — Z951 Presence of aortocoronary bypass graft: Secondary | ICD-10-CM | POA: Insufficient documentation

## 2017-08-10 DIAGNOSIS — Z79899 Other long term (current) drug therapy: Secondary | ICD-10-CM | POA: Insufficient documentation

## 2017-08-10 DIAGNOSIS — Z7901 Long term (current) use of anticoagulants: Secondary | ICD-10-CM | POA: Diagnosis not present

## 2017-08-10 DIAGNOSIS — M1990 Unspecified osteoarthritis, unspecified site: Secondary | ICD-10-CM | POA: Diagnosis not present

## 2017-08-10 DIAGNOSIS — I5022 Chronic systolic (congestive) heart failure: Secondary | ICD-10-CM | POA: Diagnosis not present

## 2017-08-10 DIAGNOSIS — K219 Gastro-esophageal reflux disease without esophagitis: Secondary | ICD-10-CM | POA: Diagnosis not present

## 2017-08-10 DIAGNOSIS — Z9842 Cataract extraction status, left eye: Secondary | ICD-10-CM | POA: Insufficient documentation

## 2017-08-10 DIAGNOSIS — I255 Ischemic cardiomyopathy: Secondary | ICD-10-CM | POA: Insufficient documentation

## 2017-08-10 DIAGNOSIS — I350 Nonrheumatic aortic (valve) stenosis: Secondary | ICD-10-CM | POA: Diagnosis not present

## 2017-08-10 DIAGNOSIS — Z9841 Cataract extraction status, right eye: Secondary | ICD-10-CM | POA: Diagnosis not present

## 2017-08-10 DIAGNOSIS — I252 Old myocardial infarction: Secondary | ICD-10-CM | POA: Insufficient documentation

## 2017-08-10 DIAGNOSIS — I4819 Other persistent atrial fibrillation: Secondary | ICD-10-CM

## 2017-08-10 DIAGNOSIS — Z9049 Acquired absence of other specified parts of digestive tract: Secondary | ICD-10-CM | POA: Insufficient documentation

## 2017-08-10 DIAGNOSIS — E785 Hyperlipidemia, unspecified: Secondary | ICD-10-CM | POA: Diagnosis not present

## 2017-08-10 DIAGNOSIS — Z95810 Presence of automatic (implantable) cardiac defibrillator: Secondary | ICD-10-CM | POA: Diagnosis not present

## 2017-08-10 DIAGNOSIS — Z9889 Other specified postprocedural states: Secondary | ICD-10-CM | POA: Diagnosis not present

## 2017-08-10 DIAGNOSIS — I209 Angina pectoris, unspecified: Secondary | ICD-10-CM | POA: Diagnosis not present

## 2017-08-10 DIAGNOSIS — Z85038 Personal history of other malignant neoplasm of large intestine: Secondary | ICD-10-CM | POA: Insufficient documentation

## 2017-08-10 HISTORY — PX: CARDIOVERSION: SHX1299

## 2017-08-10 SURGERY — CARDIOVERSION
Anesthesia: General

## 2017-08-10 MED ORDER — SODIUM CHLORIDE 0.9 % IV SOLN
INTRAVENOUS | Status: AC | PRN
  Administered 2017-08-10: 500 mL via INTRAVENOUS

## 2017-08-10 MED ORDER — LIDOCAINE HCL (CARDIAC) PF 100 MG/5ML IV SOSY
PREFILLED_SYRINGE | INTRAVENOUS | Status: DC | PRN
  Administered 2017-08-10: 40 mg via INTRAVENOUS

## 2017-08-10 MED ORDER — PROPOFOL 10 MG/ML IV BOLUS
INTRAVENOUS | Status: DC | PRN
  Administered 2017-08-10: 40 mg via INTRAVENOUS

## 2017-08-10 NOTE — Anesthesia Preprocedure Evaluation (Addendum)
Anesthesia Evaluation  Patient identified by MRN, date of birth, ID band Patient awake    Reviewed: Allergy & Precautions, NPO status , Patient's Chart, lab work & pertinent test results, reviewed documented beta blocker date and time   Airway Mallampati: II  TM Distance: >3 FB Neck ROM: Full    Dental  (+) Partial Upper   Pulmonary neg pulmonary ROS,    breath sounds clear to auscultation       Cardiovascular hypertension, Pt. on home beta blockers + CAD, + Past MI, + Cardiac Stents, + CABG and +CHF  + Cardiac Defibrillator  Rhythm:Irregular Rate:Abnormal     Neuro/Psych negative neurological ROS     GI/Hepatic Neg liver ROS, GERD  Medicated,  Endo/Other  negative endocrine ROS  Renal/GU negative Renal ROS     Musculoskeletal  (+) Arthritis , Osteoarthritis,    Abdominal Normal abdominal exam  (+)   Peds  Hematology   Anesthesia Other Findings - HLD  Reproductive/Obstetrics                            Lab Results  Component Value Date   WBC 4.8 08/06/2017   HGB 11.5 (L) 08/06/2017   HCT 36.1 (L) 08/06/2017   MCV 87.0 08/06/2017   PLT 163 08/06/2017   Lab Results  Component Value Date   INR 1.05 09/20/2014   INR 1.00 12/31/2010   INR 1.0 12/06/2008   EKG: atrial fibrillation, LBBB.  Anesthesia Physical Anesthesia Plan  ASA: IV  Anesthesia Plan: General   Post-op Pain Management:    Induction: Intravenous  PONV Risk Score and Plan: Treatment may vary due to age or medical condition  Airway Management Planned: Mask  Additional Equipment: None  Intra-op Plan:   Post-operative Plan:   Informed Consent: I have reviewed the patients History and Physical, chart, labs and discussed the procedure including the risks, benefits and alternatives for the proposed anesthesia with the patient or authorized representative who has indicated his/her understanding and acceptance.      Plan Discussed with: CRNA  Anesthesia Plan Comments:         Anesthesia Quick Evaluation

## 2017-08-10 NOTE — Anesthesia Postprocedure Evaluation (Signed)
Anesthesia Post Note  Patient: Mark Clayton  Procedure(s) Performed: CARDIOVERSION (N/A )     Patient location during evaluation: PACU Anesthesia Type: General Level of consciousness: awake and alert Pain management: pain level controlled Vital Signs Assessment: post-procedure vital signs reviewed and stable Respiratory status: spontaneous breathing, nonlabored ventilation, respiratory function stable and patient connected to nasal cannula oxygen Cardiovascular status: blood pressure returned to baseline and stable Postop Assessment: no apparent nausea or vomiting Anesthetic complications: no    Last Vitals:  Vitals:   08/10/17 1300 08/10/17 1305  BP: (!) 143/77 117/79  Pulse: 71 69  Resp: 13   Temp:    SpO2: 99% 99%    Last Pain:  Vitals:   08/10/17 1305  TempSrc:   PainSc: 0-No pain                 Effie Berkshire

## 2017-08-10 NOTE — Anesthesia Procedure Notes (Signed)
Procedure Name: General with mask airway Date/Time: 08/10/2017 12:45 PM Performed by: Colin Benton, CRNA Pre-anesthesia Checklist: Patient identified, Emergency Drugs available, Suction available and Patient being monitored Patient Re-evaluated:Patient Re-evaluated prior to induction Oxygen Delivery Method: Ambu bag Preoxygenation: Pre-oxygenation with 100% oxygen Induction Type: IV induction Ventilation: Mask ventilation without difficulty Placement Confirmation: positive ETCO2

## 2017-08-10 NOTE — Discharge Instructions (Signed)
Electrical Cardioversion, Care After °This sheet gives you information about how to care for yourself after your procedure. Your health care provider may also give you more specific instructions. If you have problems or questions, contact your health care provider. °What can I expect after the procedure? °After the procedure, it is common to have: °· Some redness on the skin where the shocks were given. ° °Follow these instructions at home: °· Do not drive for 24 hours if you were given a medicine to help you relax (sedative). °· Take over-the-counter and prescription medicines only as told by your health care provider. °· Ask your health care provider how to check your pulse. Check it often. °· Rest for 48 hours after the procedure or as told by your health care provider. °· Avoid or limit your caffeine use as told by your health care provider. °Contact a health care provider if: °· You feel like your heart is beating too quickly or your pulse is not regular. °· You have a serious muscle cramp that does not go away. °Get help right away if: °· You have discomfort in your chest. °· You are dizzy or you feel faint. °· You have trouble breathing or you are short of breath. °· Your speech is slurred. °· You have trouble moving an arm or leg on one side of your body. °· Your fingers or toes turn cold or blue. °This information is not intended to replace advice given to you by your health care provider. Make sure you discuss any questions you have with your health care provider. °Document Released: 01/04/2013 Document Revised: 10/18/2015 Document Reviewed: 09/20/2015 °Elsevier Interactive Patient Education © 2018 Elsevier Inc. ° °

## 2017-08-10 NOTE — CV Procedure (Signed)
   CARDIOVERSION NOTE  Procedure: Electrical Cardioversion Indications:  Atrial Fibrillation  Procedure Details:  Consent: Risks of procedure as well as the alternatives and risks of each were explained to the (patient/caregiver).  Consent for procedure obtained.  Time Out: Verified patient identification, verified procedure, site/side was marked, verified correct patient position, special equipment/implants available, medications/allergies/relevent history reviewed, required imaging and test results available.  Performed  Patient placed on cardiac monitor, pulse oximetry, supplemental oxygen as necessary.  Sedation given: propofol per anesthesia Pacer pads placed anterior and posterior chest.  Cardioverted 3 time(s).  Cardioverted at 150J and 200J x 2 biphasic.  Impression: Findings: Post procedure EKG shows: device interrogation shows sinus rhythm Complications: None Patient did tolerate procedure well.  Plan: 1. Ultimately successful DCCV to sinus (a-sense, v-pace) 2. Device will be interrogated by Medtronic 3. Follow-up with Roderic Palau, NP and Dr. Caryl Comes  Time Spent Directly with the Patient:  30 minutes   Pixie Casino, MD, [facility], [place] Director of the Advanced Lipid Disorders &  Cardiovascular Risk Reduction Clinic Diplomate of the American Board of Clinical Lipidology Attending Cardiologist  Direct Dial: [phone]  Fax: [phone]  Website:  [email or web address] 08/10/2017, 12:53 PM

## 2017-08-10 NOTE — H&P (Signed)
   INTERVAL PROCEDURE H&P  History and Physical Interval Note:  08/10/2017 11:58 AM  Mark Clayton has presented today for their planned procedure. The various methods of treatment have been discussed with the patient and family. After consideration of risks, benefits and other options for treatment, the patient has consented to the procedure.  The patients' outpatient history has been reviewed, patient examined, and no change in status from most recent office note within the past 30 days. I have reviewed the patients' chart and labs and will proceed as planned. Questions were answered to the patient's satisfaction.   Pixie Casino, MD, [facility], [place] Director of the Advanced Lipid Disorders &  Cardiovascular Risk Reduction Clinic Diplomate of the American Board of Clinical Lipidology Attending Cardiologist  Direct Dial: [phone]  Fax: [phone]  Website:  [email or web address] 08/10/2017, 11:58 AM

## 2017-08-10 NOTE — Transfer of Care (Signed)
Immediate Anesthesia Transfer of Care Note  Patient: Mark Clayton  Procedure(s) Performed: CARDIOVERSION (N/A )  Patient Location: Endoscopy Unit  Anesthesia Type:General  Level of Consciousness: drowsy and patient cooperative  Airway & Oxygen Therapy: Patient Spontanous Breathing  Post-op Assessment: Report given to RN and Post -op Vital signs reviewed and stable  Post vital signs: Reviewed and stable  Last Vitals:  Vitals Value Taken Time  BP 139/72 08/10/2017 12:49 PM  Temp    Pulse 78 08/10/2017 12:50 PM  Resp 18 08/10/2017 12:50 PM  SpO2 100 % 08/10/2017 12:50 PM    Last Pain:  Vitals:   08/10/17 1217  TempSrc: Oral  PainSc: 0-No pain         Complications: No apparent anesthesia complications

## 2017-08-11 ENCOUNTER — Encounter (HOSPITAL_COMMUNITY): Payer: Self-pay | Admitting: Internal Medicine

## 2017-08-20 ENCOUNTER — Encounter (HOSPITAL_COMMUNITY): Payer: Self-pay | Admitting: Nurse Practitioner

## 2017-08-20 ENCOUNTER — Ambulatory Visit (HOSPITAL_COMMUNITY)
Admission: RE | Admit: 2017-08-20 | Discharge: 2017-08-20 | Disposition: A | Discharge status: Home / Self Care | Payer: Medicare Other | Source: Ambulatory Visit | Attending: Nurse Practitioner | Admitting: Nurse Practitioner

## 2017-08-20 VITALS — BP 132/82 | HR 72 | Ht 72.0 in | Wt 166.0 lb

## 2017-08-20 DIAGNOSIS — K219 Gastro-esophageal reflux disease without esophagitis: Secondary | ICD-10-CM | POA: Diagnosis not present

## 2017-08-20 DIAGNOSIS — I252 Old myocardial infarction: Secondary | ICD-10-CM | POA: Diagnosis not present

## 2017-08-20 DIAGNOSIS — I255 Ischemic cardiomyopathy: Secondary | ICD-10-CM | POA: Diagnosis not present

## 2017-08-20 DIAGNOSIS — Z79899 Other long term (current) drug therapy: Secondary | ICD-10-CM | POA: Insufficient documentation

## 2017-08-20 DIAGNOSIS — I481 Persistent atrial fibrillation: Secondary | ICD-10-CM

## 2017-08-20 DIAGNOSIS — H3530 Unspecified macular degeneration: Secondary | ICD-10-CM | POA: Diagnosis not present

## 2017-08-20 DIAGNOSIS — I110 Hypertensive heart disease with heart failure: Secondary | ICD-10-CM | POA: Insufficient documentation

## 2017-08-20 DIAGNOSIS — Z8249 Family history of ischemic heart disease and other diseases of the circulatory system: Secondary | ICD-10-CM | POA: Diagnosis not present

## 2017-08-20 DIAGNOSIS — Z951 Presence of aortocoronary bypass graft: Secondary | ICD-10-CM | POA: Diagnosis not present

## 2017-08-20 DIAGNOSIS — Z85038 Personal history of other malignant neoplasm of large intestine: Secondary | ICD-10-CM | POA: Insufficient documentation

## 2017-08-20 DIAGNOSIS — Z955 Presence of coronary angioplasty implant and graft: Secondary | ICD-10-CM | POA: Diagnosis not present

## 2017-08-20 DIAGNOSIS — E785 Hyperlipidemia, unspecified: Secondary | ICD-10-CM | POA: Insufficient documentation

## 2017-08-20 DIAGNOSIS — I2510 Atherosclerotic heart disease of native coronary artery without angina pectoris: Secondary | ICD-10-CM | POA: Insufficient documentation

## 2017-08-20 DIAGNOSIS — I5022 Chronic systolic (congestive) heart failure: Secondary | ICD-10-CM | POA: Diagnosis not present

## 2017-08-20 DIAGNOSIS — I4891 Unspecified atrial fibrillation: Secondary | ICD-10-CM | POA: Insufficient documentation

## 2017-08-20 DIAGNOSIS — Z9889 Other specified postprocedural states: Secondary | ICD-10-CM | POA: Insufficient documentation

## 2017-08-20 DIAGNOSIS — Z95810 Presence of automatic (implantable) cardiac defibrillator: Secondary | ICD-10-CM | POA: Diagnosis not present

## 2017-08-20 DIAGNOSIS — Z8042 Family history of malignant neoplasm of prostate: Secondary | ICD-10-CM | POA: Diagnosis not present

## 2017-08-20 DIAGNOSIS — Z7901 Long term (current) use of anticoagulants: Secondary | ICD-10-CM | POA: Insufficient documentation

## 2017-08-20 DIAGNOSIS — I4819 Other persistent atrial fibrillation: Secondary | ICD-10-CM

## 2017-08-20 MED ORDER — NITROGLYCERIN 0.4 MG SL SUBL: SUBLINGUAL_TABLET | SUBLINGUAL | 3 refills | Status: DC

## 2017-08-20 NOTE — Addendum Note (Signed)
Encounter addended by: Sherran Needs, NP on: 08/20/2017 10:50 AM  Actions taken: LOS modified

## 2017-08-20 NOTE — Progress Notes (Signed)
Primary Care Physician: [name], MD Referring Physician: Dr. Jani Files Mark Clayton is a 77 y.o. male with a h/o ICD, CAD,EF 20% paroxsymal afib that is in the afib clinic for f/u of [name], [place] visit, 5/1. He was in afib at that visit, in [place] when checked 07/19/17, and was not terribly symptomatic.He had plans to go to the beach but found he was symptomatic with climbing the steps and inclines at the beach, so cut his trip short. He is ok with flat surfaces.He has also had fatigue. Fluid status is stable.  F/u in afib clinic, 5/24. He had successful cardioversion but possibly yesterday went back into afib as he noted that his BP monitor showed irregular HR. He isnot symptomatic today. He has been very  busy cutting hair this week, but he was able to do that easily.   Today, he denies symptoms of palpitations, chest pain, shortness of breath, orthopnea, PND, lower extremity edema, dizziness, presyncope, syncope, or neurologic sequela. The patient is tolerating medications without difficulties and is otherwise without complaint today.   Past Medical History:  Diagnosis Date  . AICD (automatic cardioverter/defibrillator) present 01/17/2003   Medtronic Maximo 7232CX ICD, serial [id number]  . Anemia 02-06-11   takes oral iron  . Arthritis    hands, knees  . CAD (coronary artery disease) 2003   a. h/o MI and CABG in 2003. b. s/p DES to SVG-RPDA-RPLB in 08/2014.  Marland Kitchen Cancer of sigmoid colon ([place]) 2012   a. s/p colon surgery.  . Carotid bruit   . Chronic systolic CHF (congestive heart failure) (HCC)    a. EF 20% in 2014.  Marland Kitchen Cough   . GERD (gastroesophageal reflux disease) 02-06-11  . HTN (hypertension)   . Hyperlipidemia   . Ischemic cardiomyopathy    a. EF 20% in 2014. (Master study EF >20%)  . LV (left ventricular) mural thrombus    a. Last echo 12/2012 EF 20% with mural thrombus, mod-severe left atrial enlargement. Per Dr. Olin Pia notes from that time, echo in 2009  demonstrated something similar - he had been on warfarin following prior MI and this was stopped after a number of months. Given lack of data regarding anticoagulation from chronic clot, he has not been on anticoagulation since.  . Macular degeneration   . Myocardial infarct [facility])    2003   Past Surgical History:  Procedure Laterality Date  . CARDIAC CATHETERIZATION N/A 09/20/2014   Procedure: Left Heart Cath and Coronary Angiography;  Surgeon: Jettie Booze, MD; LAD 95%, D1 100%, CFX liner percent, OM 200%, OM 390%, RCA 90%, LIMA-LAD okay, SVG-OM 2-OM 3 minimal disease, SVG-RPDA-RPLB 100% between the RPDA and RPL     . CARDIAC CATHETERIZATION N/A 09/20/2014   Procedure: Coronary Stent Intervention;  Surgeon: Jettie Booze, MD; Synergy DES 4 x 24 mm reducing the stenosis to 5%   . CARDIAC DEFIBRILLATOR PLACEMENT  01/17/03   6949 lead. Medtronic. remote-no; with later revision  . CARDIOVERSION N/A 05/22/2016   Procedure: CARDIOVERSION;  Surgeon: Lelon Perla, MD;  Location: [facility] ENDOSCOPY;  Service: Cardiovascular;  Laterality: N/A;  . CARDIOVERSION N/A 08/10/2017   Procedure: CARDIOVERSION;  Surgeon: Pixie Casino, MD;  Location: [facility] ENDOSCOPY;  Service: Cardiovascular;  Laterality: N/A;  . CATARACT EXTRACTION W/ INTRAOCULAR LENS  IMPLANT, BILATERAL Bilateral June/-July 2009   Dr. Katy Fitch  . COLON RESECTION  02/09/2011   Procedure: LAPAROSCOPIC SIGMOID COLON RESECTION;  Surgeon: Pedro Earls, MD;  Location: [facility] ORS;  Service: General;  Laterality: N/A;  Laparoscopic Assisted Sigmoid Colectomy  . COLON SURGERY    . COLONOSCOPY  08/31/2011   Procedure: COLONOSCOPY;  Surgeon: Jerene Bears, MD;  Location: [facility] ENDOSCOPY;  Service: Gastroenterology;  Laterality: N/A;  . COLONOSCOPY N/A 09/05/2012   Procedure: COLONOSCOPY;  Surgeon: Jerene Bears, MD;  Location: [facility] ENDOSCOPY;  Service: Gastroenterology;  Laterality: N/A;  . COLONOSCOPY N/A 04/18/2013   Procedure: COLONOSCOPY;  Surgeon: Jerene Bears, MD;  Location: [facility] ENDOSCOPY;  Service: Gastroenterology;  Laterality: N/A;  . COLONOSCOPY N/A 04/09/2014   Procedure: COLONOSCOPY;  Surgeon: Jerene Bears, MD;  Location: [facility] ENDOSCOPY;  Service: Gastroenterology;  Laterality: N/A;  . COLONOSCOPY WITH PROPOFOL N/A 05/03/2017   Procedure: COLONOSCOPY WITH PROPOFOL;  Surgeon: Jerene Bears, MD;  Location: [facility] ENDOSCOPY;  Service: Gastroenterology;  Laterality: N/A;  . CORONARY ANGIOPLASTY    . CORONARY ARTERY BYPASS GRAFT  01/2002   LIMA-LAD, SVG-OM 2-OM 3, SVG-RPDA-RPLB  . ICD GENERATOR CHANGE  2010   Medtronic Virtuoso II VR ICD  . INGUINAL HERNIA REPAIR Right 2000's X 2  . LAPAROSCOPIC RIGHT HEMI COLECTOMY N/A 11/04/2012   Procedure: LAPAROSCOPIC RIGHT HEMI COLECTOMY;  Surgeon: Pedro Earls, MD;  Location: [facility] ORS;  Service: General;  Laterality: N/A;  . TONSILLECTOMY  ~ 1950    Current Outpatient Medications  Medication Sig Dispense Refill  . carvedilol (COREG) 25 MG tablet TAKE 1 TABLET BY MOUTH AT  BEDTIME 90 tablet 1  . furosemide (LASIX) 40 MG tablet TAKE 1 TABLET BY MOUTH  DAILY 90 tablet 1  . isosorbide mononitrate (IMDUR) 30 MG 24 hr tablet TAKE 1 TABLET BY MOUTH  DAILY (Patient taking differently: Take 30 mg by mouth at bedtime) 90 tablet 2  . losartan (COZAAR) 50 MG tablet TAKE 1 TABLET BY MOUTH  DAILY 90 tablet 1  . pantoprazole (PROTONIX) 40 MG tablet Take 1 tablet (40 mg total) by mouth daily. 90 tablet 3  . Polyethyl Glycol-Propyl Glycol (SYSTANE ULTRA) 0.4-0.3 % SOLN Place 1 drop into both eyes 2 (two) times daily as needed (for dry eyes).     . pravastatin (PRAVACHOL) 20 MG tablet Take 1 tablet (20 mg total) by mouth at bedtime. 90 tablet 3  . PRESCRIPTION MEDICATION Inject 1 each as directed every 5 (five) weeks. Eye Injection every 5 weeks.     . pseudoephedrine-acetaminophen (TYLENOL SINUS) 30-500 MG TABS tablet Take 2 tablets by mouth every 6 (six) hours as needed (for sinus problems).    . rivaroxaban (XARELTO)  20 MG TABS tablet Take 1 tablet (20 mg total) by mouth daily with supper. (Patient taking differently: Take 20 mg by mouth at bedtime. ) 30 tablet 3  . VITAMIN K PO Take 1 tablet by mouth daily.    . meclizine (ANTIVERT) 25 MG tablet TAKE 1 TABLET BY MOUTH  DAILY AS NEEDED FOR  DIZZINESS. (Patient not taking: Reported on 08/20/2017) 60 tablet 1  . nitroGLYCERIN (NITROSTAT) 0.4 MG SL tablet DISSOLVE 1 TABLET UNDER THE TONGUE EVERY 5 MINUTES AS  NEEDED FOR CHEST PAIN UP TO 3 DOSES. CALL 911 IF CHEST  PAIN PERSISTS (Patient not taking: Reported on 08/20/2017) 25 tablet 0   No current facility-administered medications for this encounter.     Allergies  Allergen Reactions  . Latex Rash  . Tape Rash and Other (See Comments)    USE PAPER    Social History   Socioeconomic History  .  Marital status: Widowed    Spouse name: Not on file  . Number of children: 3  . Years of education: Not on file  . Highest education level: Not on file  Occupational History  . Occupation: self employed  Social Needs  . Financial resource strain: Not on file  . Food insecurity:    Worry: Not on file    Inability: Not on file  . Transportation needs:    Medical: Not on file    Non-medical: Not on file  Tobacco Use  . Smoking status: Never Smoker  . Smokeless tobacco: Never Used  Substance and Sexual Activity  . Alcohol use: Yes    Alcohol/week: 1.2 oz    Types: 2 Cans of beer per week  . Drug use: No  . Sexual activity: Yes  Lifestyle  . Physical activity:    Days per week: Not on file    Minutes per session: Not on file  . Stress: Not on file  Relationships  . Social connections:    Talks on phone: Not on file    Gets together: Not on file    Attends religious service: Not on file    Active member of club or organization: Not on file    Attends meetings of clubs or organizations: Not on file    Relationship status: Not on file  . Intimate partner violence:    Fear of current or ex partner:  Not on file    Emotionally abused: Not on file    Physically abused: Not on file    Forced sexual activity: Not on file  Other Topics Concern  . Not on file  Social History Narrative   Full time. Married.     Family History  Problem Relation Age of Onset  . Hypertension Father   . Hyperlipidemia Father   . Heart disease Father   . Prostate cancer Father   . Alzheimer's disease Mother   . Hypertension Sister   . Hyperlipidemia Sister   . Colon cancer Neg Hx   . Esophageal cancer Neg Hx   . Rectal cancer Neg Hx   . Stomach cancer Neg Hx     ROS- All systems are reviewed and negative except as per the HPI above  Physical Exam: Vitals:   08/20/17 0918  BP: 132/82  Pulse: 72  Weight: 166 lb (75.3 kg)  Height: 6' (1.829 m)   Wt Readings from Last 3 Encounters:  08/20/17 166 lb (75.3 kg)  08/10/17 161 lb (73 kg)  08/06/17 161 lb (73 kg)    Labs: Lab Results  Component Value Date   NA 140 08/06/2017   K 3.9 08/06/2017   CL 106 08/06/2017   CO2 25 08/06/2017   GLUCOSE 145 (H) 08/06/2017   BUN 8 08/06/2017   CREATININE 1.13 08/06/2017   CALCIUM 9.4 08/06/2017   Lab Results  Component Value Date   INR 1.05 09/20/2014   Lab Results  Component Value Date   CHOL 185 01/15/2017   HDL 52.60 01/15/2017   LDLCALC 112 (H) 01/15/2017   TRIG 100.0 01/15/2017     GEN- The patient is well appearing, alert and oriented x 3 today.   Head- normocephalic, atraumatic Eyes-  Sclera clear, conjunctiva pink Ears- hearing intact Oropharynx- clear Neck- supple, no JVP Lymph- no cervical lymphadenopathy Lungs- Clear to ausculation bilaterally, normal work of breathing Heart- irregular rate and rhythm, no murmurs, rubs or gallops, PMI not laterally displaced GI- soft, NT, ND, +  BS Extremities- no clubbing, cyanosis, or edema MS- no significant deformity or atrophy Skin- no rash or lesion Psych- euthymic mood, full affect Neuro- strength and sensation are intact  EKG-  afib with LBBB at 72 bpm, qrs int 174 ms, qtc 486 ms Epic records reviewed Holter monitor reviewed    Assessment and Plan: 1. Afib Last cardioverion  05/22/16 Afib burden has been low Successful cardioversion 5/14 but ERAF Discussed antiarrythmic's but will get updated echo, last done in 2014, and get him to f/u with Dr. Caryl Comes for further plan Continue xarelto 20 mg a day with [id number] score of at least 5, states no missed doses Continue carvedilol  2. CHF Weight stable Continue diuretic Avoid salt Daily weights   F/u with Dr. Caryl Comes in the next few weeks after echo  Butch Penny C. Chancy Claros, [facility] [address] [place], [place] [address] [phone]

## 2017-08-30 ENCOUNTER — Ambulatory Visit (INDEPENDENT_AMBULATORY_CARE_PROVIDER_SITE_OTHER): Payer: Medicare Other | Admitting: *Deleted

## 2017-08-30 DIAGNOSIS — I255 Ischemic cardiomyopathy: Secondary | ICD-10-CM

## 2017-08-30 DIAGNOSIS — I5022 Chronic systolic (congestive) heart failure: Secondary | ICD-10-CM | POA: Diagnosis not present

## 2017-08-30 DIAGNOSIS — Z95810 Presence of automatic (implantable) cardiac defibrillator: Secondary | ICD-10-CM

## 2017-08-30 NOTE — Progress Notes (Signed)
Remote ICD transmission.   

## 2017-08-31 NOTE — Progress Notes (Signed)
EPIC Encounter for ICM Monitoring  Patient Name: Mark Clayton is a 77 y.o. male Date: 08/31/2017 Primary Care Physican: [name], MD Primary Cardiologist: Stanford Breed Electrophysiologist: Faustino Congress Weight:166lbs (08/20/17 office visit)   Heart Failure questions reviewed, pt asymptomatic for fluid symptoms but does feel weak from Afib. He said the will be having an echocardiogram.   Thoracic impedance normal.  Prescribed dosage: Furosemide 40 mg 1 tablet daily  Labs: 07/27/2017 Creatinine 1.01, BUN 14, Potassium 3.5, Sodium 139 07/19/2017 Creatinine 1.08, BUN 10, Potassium 4.2, Sodium 141, EGFR 70.54  A complete set of results can be found in Results Review.  Recommendations: No changes.    Encouraged to call for fluid symptoms.  Follow-up plan: ICM clinic phone appointment on 11/01/2017.  Office appointment scheduled 09/28/2017 with Dr. Caryl Comes.  Copy of ICM check sent to Dr. Caryl Comes.   3 month ICM trend: 08/30/2017    1 Year ICM trend:       Rosalene Billings, RN 08/31/2017 12:42 PM

## 2017-09-03 ENCOUNTER — Ambulatory Visit (HOSPITAL_COMMUNITY)
Admission: RE | Admit: 2017-09-03 | Discharge: 2017-09-03 | Disposition: A | Discharge status: Home / Self Care | Payer: Medicare Other | Source: Ambulatory Visit | Attending: Nurse Practitioner | Admitting: Nurse Practitioner

## 2017-09-03 DIAGNOSIS — E785 Hyperlipidemia, unspecified: Secondary | ICD-10-CM | POA: Insufficient documentation

## 2017-09-03 DIAGNOSIS — I4891 Unspecified atrial fibrillation: Secondary | ICD-10-CM | POA: Diagnosis not present

## 2017-09-03 DIAGNOSIS — I481 Persistent atrial fibrillation: Secondary | ICD-10-CM | POA: Insufficient documentation

## 2017-09-03 DIAGNOSIS — Z95810 Presence of automatic (implantable) cardiac defibrillator: Secondary | ICD-10-CM | POA: Insufficient documentation

## 2017-09-03 DIAGNOSIS — I4819 Other persistent atrial fibrillation: Secondary | ICD-10-CM

## 2017-09-03 DIAGNOSIS — I081 Rheumatic disorders of both mitral and tricuspid valves: Secondary | ICD-10-CM | POA: Insufficient documentation

## 2017-09-03 DIAGNOSIS — I509 Heart failure, unspecified: Secondary | ICD-10-CM | POA: Insufficient documentation

## 2017-09-03 DIAGNOSIS — I255 Ischemic cardiomyopathy: Secondary | ICD-10-CM | POA: Insufficient documentation

## 2017-09-03 NOTE — Progress Notes (Signed)
Echocardiogram 2D Echocardiogram has been performed.  Mark Clayton 09/03/2017, 3:18 PM

## 2017-09-06 ENCOUNTER — Encounter (HOSPITAL_COMMUNITY): Payer: Self-pay | Admitting: Nurse Practitioner

## 2017-09-06 ENCOUNTER — Ambulatory Visit (HOSPITAL_COMMUNITY)
Admission: RE | Admit: 2017-09-06 | Discharge: 2017-09-06 | Disposition: A | Discharge status: Home / Self Care | Payer: Medicare Other | Source: Ambulatory Visit | Attending: Nurse Practitioner | Admitting: Nurse Practitioner

## 2017-09-06 VITALS — BP 132/78 | HR 70 | Ht 72.0 in | Wt 164.0 lb

## 2017-09-06 DIAGNOSIS — I481 Persistent atrial fibrillation: Secondary | ICD-10-CM | POA: Diagnosis not present

## 2017-09-06 DIAGNOSIS — I5022 Chronic systolic (congestive) heart failure: Secondary | ICD-10-CM | POA: Diagnosis not present

## 2017-09-06 DIAGNOSIS — Z95810 Presence of automatic (implantable) cardiac defibrillator: Secondary | ICD-10-CM | POA: Insufficient documentation

## 2017-09-06 DIAGNOSIS — H3530 Unspecified macular degeneration: Secondary | ICD-10-CM | POA: Insufficient documentation

## 2017-09-06 DIAGNOSIS — I110 Hypertensive heart disease with heart failure: Secondary | ICD-10-CM | POA: Insufficient documentation

## 2017-09-06 DIAGNOSIS — I255 Ischemic cardiomyopathy: Secondary | ICD-10-CM | POA: Insufficient documentation

## 2017-09-06 DIAGNOSIS — I4819 Other persistent atrial fibrillation: Secondary | ICD-10-CM

## 2017-09-06 DIAGNOSIS — Z7901 Long term (current) use of anticoagulants: Secondary | ICD-10-CM | POA: Insufficient documentation

## 2017-09-06 DIAGNOSIS — I10 Essential (primary) hypertension: Secondary | ICD-10-CM | POA: Diagnosis not present

## 2017-09-06 DIAGNOSIS — I252 Old myocardial infarction: Secondary | ICD-10-CM | POA: Insufficient documentation

## 2017-09-06 DIAGNOSIS — H3562 Retinal hemorrhage, left eye: Secondary | ICD-10-CM | POA: Diagnosis not present

## 2017-09-06 DIAGNOSIS — E785 Hyperlipidemia, unspecified: Secondary | ICD-10-CM | POA: Insufficient documentation

## 2017-09-06 DIAGNOSIS — H35722 Serous detachment of retinal pigment epithelium, left eye: Secondary | ICD-10-CM | POA: Diagnosis not present

## 2017-09-06 DIAGNOSIS — H353212 Exudative age-related macular degeneration, right eye, with inactive choroidal neovascularization: Secondary | ICD-10-CM | POA: Diagnosis not present

## 2017-09-06 DIAGNOSIS — H353221 Exudative age-related macular degeneration, left eye, with active choroidal neovascularization: Secondary | ICD-10-CM | POA: Diagnosis not present

## 2017-09-06 MED ORDER — AMIODARONE HCL 200 MG PO TABS: ORAL_TABLET | ORAL | 0 refills | Status: DC

## 2017-09-06 NOTE — Patient Instructions (Signed)
Start Amiodarone 200mg  twice a day until you see Dr. Caryl Comes

## 2017-09-06 NOTE — Progress Notes (Signed)
Electrophysiology Office Note   Date:  09/06/2017   ID:  Mark Clayton, Mark Clayton 10-13-1940, MRN [id number]  PCP:  [name], MD  Primary Cardiologist:  Dr End Primary Electrophysiologist: Dr Caryl Comes   CC: afib   History of Present Illness: Mark Clayton is a 77 y.o. male who presents today for AF clinic follow-up.   He presents as an urgent walk in stating that he feels poorly.  He has persistent afib in the setting of severe LA enlargement.  He has not tried AAD therapy.  He feels "washed out" with his afib.  He has reduced EF with prior ICD implanted and followed by Dr Caryl Comes.  Today, he denies symptoms of palpitations, chest pain, shortness of breath, orthopnea, PND, lower extremity edema, claudication, dizziness, presyncope, syncope, bleeding, or neurologic sequela. The patient is tolerating medications without difficulties and is otherwise without complaint today.    Past Medical History:  Diagnosis Date  . AICD (automatic cardioverter/defibrillator) present 01/17/2003   Medtronic Maximo 7232CX ICD, serial [id number]  . Anemia 02-06-11   takes oral iron  . Arthritis    hands, knees  . CAD (coronary artery disease) 2003   a. h/o MI and CABG in 2003. b. s/p DES to SVG-RPDA-RPLB in 08/2014.  Marland Kitchen Cancer of sigmoid colon ([place]) 2012   a. s/p colon surgery.  . Carotid bruit   . Chronic systolic CHF (congestive heart failure) (HCC)    a. EF 20% in 2014.  Marland Kitchen Cough   . GERD (gastroesophageal reflux disease) 02-06-11  . HTN (hypertension)   . Hyperlipidemia   . Ischemic cardiomyopathy    a. EF 20% in 2014. (Master study EF >20%)  . LV (left ventricular) mural thrombus    a. Last echo 12/2012 EF 20% with mural thrombus, mod-severe left atrial enlargement. Per Dr. Olin Pia notes from that time, echo in 2009 demonstrated something similar - he had been on warfarin following prior MI and this was stopped after a number of months. Given lack of data regarding anticoagulation from  chronic clot, he has not been on anticoagulation since.  . Macular degeneration   . Myocardial infarct [facility])    2003   Past Surgical History:  Procedure Laterality Date  . CARDIAC CATHETERIZATION N/A 09/20/2014   Procedure: Left Heart Cath and Coronary Angiography;  Surgeon: Jettie Booze, MD; LAD 95%, D1 100%, CFX liner percent, OM 200%, OM 390%, RCA 90%, LIMA-LAD okay, SVG-OM 2-OM 3 minimal disease, SVG-RPDA-RPLB 100% between the RPDA and RPL     . CARDIAC CATHETERIZATION N/A 09/20/2014   Procedure: Coronary Stent Intervention;  Surgeon: Jettie Booze, MD; Synergy DES 4 x 24 mm reducing the stenosis to 5%   . CARDIAC DEFIBRILLATOR PLACEMENT  01/17/03   6949 lead. Medtronic. remote-no; with later revision  . CARDIOVERSION N/A 05/22/2016   Procedure: CARDIOVERSION;  Surgeon: Lelon Perla, MD;  Location: [facility] ENDOSCOPY;  Service: Cardiovascular;  Laterality: N/A;  . CARDIOVERSION N/A 08/10/2017   Procedure: CARDIOVERSION;  Surgeon: Pixie Casino, MD;  Location: [facility] ENDOSCOPY;  Service: Cardiovascular;  Laterality: N/A;  . CATARACT EXTRACTION W/ INTRAOCULAR LENS  IMPLANT, BILATERAL Bilateral June/-July 2009   Dr. Katy Fitch  . COLON RESECTION  02/09/2011   Procedure: LAPAROSCOPIC SIGMOID COLON RESECTION;  Surgeon: Pedro Earls, MD;  Location: [facility] ORS;  Service: General;  Laterality: N/A;  Laparoscopic Assisted Sigmoid Colectomy  . COLON SURGERY    . COLONOSCOPY  08/31/2011   Procedure: COLONOSCOPY;  Surgeon:  Jerene Bears, MD;  Location: [facility] ENDOSCOPY;  Service: Gastroenterology;  Laterality: N/A;  . COLONOSCOPY N/A 09/05/2012   Procedure: COLONOSCOPY;  Surgeon: Jerene Bears, MD;  Location: [facility] ENDOSCOPY;  Service: Gastroenterology;  Laterality: N/A;  . COLONOSCOPY N/A 04/18/2013   Procedure: COLONOSCOPY;  Surgeon: Jerene Bears, MD;  Location: [facility] ENDOSCOPY;  Service: Gastroenterology;  Laterality: N/A;  . COLONOSCOPY N/A 04/09/2014   Procedure: COLONOSCOPY;  Surgeon: Jerene Bears, MD;   Location: [facility] ENDOSCOPY;  Service: Gastroenterology;  Laterality: N/A;  . COLONOSCOPY WITH PROPOFOL N/A 05/03/2017   Procedure: COLONOSCOPY WITH PROPOFOL;  Surgeon: Jerene Bears, MD;  Location: [facility] ENDOSCOPY;  Service: Gastroenterology;  Laterality: N/A;  . CORONARY ANGIOPLASTY    . CORONARY ARTERY BYPASS GRAFT  01/2002   LIMA-LAD, SVG-OM 2-OM 3, SVG-RPDA-RPLB  . ICD GENERATOR CHANGE  2010   Medtronic Virtuoso II VR ICD  . INGUINAL HERNIA REPAIR Right 2000's X 2  . LAPAROSCOPIC RIGHT HEMI COLECTOMY N/A 11/04/2012   Procedure: LAPAROSCOPIC RIGHT HEMI COLECTOMY;  Surgeon: Pedro Earls, MD;  Location: [facility] ORS;  Service: General;  Laterality: N/A;  . TONSILLECTOMY  ~ 1950     Current Outpatient Medications  Medication Sig Dispense Refill  . carvedilol (COREG) 25 MG tablet TAKE 1 TABLET BY MOUTH AT  BEDTIME 90 tablet 1  . furosemide (LASIX) 40 MG tablet TAKE 1 TABLET BY MOUTH  DAILY 90 tablet 1  . isosorbide mononitrate (IMDUR) 30 MG 24 hr tablet TAKE 1 TABLET BY MOUTH  DAILY (Patient taking differently: Take 30 mg by mouth at bedtime) 90 tablet 2  . losartan (COZAAR) 50 MG tablet TAKE 1 TABLET BY MOUTH  DAILY 90 tablet 1  . meclizine (ANTIVERT) 25 MG tablet TAKE 1 TABLET BY MOUTH  DAILY AS NEEDED FOR  DIZZINESS. 60 tablet 1  . nitroGLYCERIN (NITROSTAT) 0.4 MG SL tablet DISSOLVE 1 TABLET UNDER THE TONGUE EVERY 5 MINUTES AS  NEEDED FOR CHEST PAIN UP TO 3 DOSES. CALL 911 IF CHEST  PAIN PERSISTS 75 tablet 3  . pantoprazole (PROTONIX) 40 MG tablet Take 1 tablet (40 mg total) by mouth daily. 90 tablet 3  . Polyethyl Glycol-Propyl Glycol (SYSTANE ULTRA) 0.4-0.3 % SOLN Place 1 drop into both eyes 2 (two) times daily as needed (for dry eyes).     . pravastatin (PRAVACHOL) 20 MG tablet Take 1 tablet (20 mg total) by mouth at bedtime. 90 tablet 3  . PRESCRIPTION MEDICATION Inject 1 each as directed every 5 (five) weeks. Eye Injection every 5 weeks.     . rivaroxaban (XARELTO) 20 MG TABS tablet Take 1  tablet (20 mg total) by mouth daily with supper. (Patient taking differently: Take 20 mg by mouth at bedtime. ) 30 tablet 3  . VITAMIN K PO Take 1 tablet by mouth daily.     No current facility-administered medications for this encounter.     Allergies:   Latex and Tape   Social History:  The patient  reports that he has never smoked. He has never used smokeless tobacco. He reports that he drinks about 1.2 oz of alcohol per week. He reports that he does not use drugs.   Family History:  The patient's family history includes Alzheimer's disease in his mother; Heart disease in his father; Hyperlipidemia in his father and sister; Hypertension in his father and sister; Prostate cancer in his father.    ROS:  Please see the history of present illness.   All other  systems are personally reviewed and negative.    PHYSICAL EXAM: VS:  BP 132/78 (BP Location: Left Arm, Patient Position: Sitting, Cuff Size: Normal)   Pulse 70   Ht 6' (1.829 m)   Wt 164 lb (74.4 kg)   BMI 22.24 kg/m  , BMI Body mass index is 22.24 kg/m. GEN: Well nourished, well developed, in no acute distress  HEENT: normal  Neck: no JVD, carotid bruits, or masses Cardiac: iRRR; no murmurs, rubs, or gallops,no edema  Respiratory:  clear to auscultation bilaterally, normal work of breathing GI: soft, nontender, nondistended, + BS MS: no deformity or atrophy  Skin: warm and dry, device pocket is well healed Neuro:  Strength and sensation are intact Psych: euthymic mood, full affect   EKG:  EKG is ordered today. The ekg ordered today is personally reviewed and shows afib, V rate 70 bpm, LBBB (QRS 168 msec)  Device interrogation is personally reviewed today in detail.  See PaceArt for details.   Recent Labs: 07/19/2017: ALT 10; Pro B Natriuretic peptide (BNP) 704.0; TSH 4.93 08/06/2017: BUN 8; Creatinine, Ser 1.13; Hemoglobin 11.5; Platelets 163; Potassium 3.9; Sodium 140  personally reviewed   Lipid Panel       Component Value Date/Time   CHOL 185 01/15/2017 0840   TRIG 100.0 01/15/2017 0840   HDL 52.60 01/15/2017 0840   CHOLHDL 4 01/15/2017 0840   VLDL 20.0 01/15/2017 0840   LDLCALC 112 (H) 01/15/2017 0840   personally reviewed   Wt Readings from Last 3 Encounters:  09/06/17 164 lb (74.4 kg)  08/20/17 166 lb (75.3 kg)  08/10/17 161 lb (73 kg)      Other studies Reviewed: Additional studies/ records that were personally reviewed today include: prior echo, AF clinic notes  Review of the above records today demonstrates: as above   ASSESSMENT AND PLAN:  1.  Persistent afib The patient has symptomatic, recurrent persistent atrial fibrillation. he has not tried AAD therapy. Chads2vasc score is 5.  he is anticoagulated with xarelto . Therapeutic strategies for afib including rate and rhythm control were discussed in detail with the patient today. He would like to try rhythm control.  Given severe LA enlargement, he is a poor candidate for ablation.  Risk, benefits, and alternatives to tikosyn and amiodarone were discussed at length.  He is very clear that he would like to avoid hospitalization for tikosyn.  He requests amiodarone.  He is aware of risks of this medicine. Start amiodarone 200mg  BID today. He will see Dr Caryl Comes as scheduled in early July.  If still in AF, I would advise cardioversion at that time.  Will defer long term amiodarone management to Dr Caryl Comes. Consider upgrade to CRT-D with AV nodal ablation if he fails medical therapy with amiodarone given his LBBB with QRS > 116msec at baseline.  2. Ischemic CM As above  3. HTN Stable No change required today  Follow-up with Dr Caryl Comes as scheduled Return to AF clinic as needed  Current medicines are reviewed at length with the patient today.   The patient does not have concerns regarding his medicines.  The following changes were made today:  none  Labs/ tests ordered today include:  Orders Placed This Encounter   Procedures  . EKG 12-Lead     Signed, Thompson Grayer, MD  09/06/2017 11:17 AM     [facility] [address] [place] [address] [phone] (office) [phone] (fax)

## 2017-09-09 ENCOUNTER — Telehealth: Payer: Self-pay | Admitting: Physician Assistant

## 2017-09-09 NOTE — Telephone Encounter (Signed)
Echo results were reviewed with patient when he called to afib clinic earlier today. Request was put in for earlier appt with Dr. Caryl Comes per pt request.

## 2017-09-09 NOTE — Telephone Encounter (Signed)
New message    Pt is asking for a call back about his echo.

## 2017-09-09 NOTE — Telephone Encounter (Signed)
Left a message to call back.

## 2017-09-09 NOTE — Telephone Encounter (Signed)
Patient stated he was returning a call from the [address] office. Message has been routed to the [address] triage.

## 2017-09-10 LAB — CUP PACEART REMOTE DEVICE CHECK
Battery Voltage: 2.65 V
Brady Statistic RV Percent Paced: 0.56 %
Date Time Interrogation Session: 20190603041607
HighPow Impedance: 44 Ohm
HighPow Impedance: 56 Ohm
Implantable Lead Implant Date: 20041020
Implantable Lead Implant Date: 20100913
Implantable Lead Location: 753860
Implantable Lead Location: 753860
Implantable Lead Model: 5076
Implantable Lead Model: 6949
Implantable Pulse Generator Implant Date: 20100913
Lead Channel Impedance Value: 494 Ohm
Lead Channel Sensing Intrinsic Amplitude: 31.625 mV
Lead Channel Sensing Intrinsic Amplitude: 31.625 mV
Lead Channel Setting Pacing Amplitude: 3 V
Lead Channel Setting Pacing Pulse Width: 0.8 ms
Lead Channel Setting Sensing Sensitivity: 0.45 mV

## 2017-09-20 ENCOUNTER — Encounter (HOSPITAL_COMMUNITY): Payer: Self-pay | Admitting: *Deleted

## 2017-09-22 ENCOUNTER — Encounter: Payer: Self-pay | Admitting: Internal Medicine

## 2017-09-22 ENCOUNTER — Ambulatory Visit (HOSPITAL_COMMUNITY)
Admission: RE | Admit: 2017-09-22 | Discharge: 2017-09-22 | Disposition: A | Discharge status: Home / Self Care | Payer: Medicare Other | Source: Ambulatory Visit | Attending: Nurse Practitioner | Admitting: Nurse Practitioner

## 2017-09-22 ENCOUNTER — Telehealth: Payer: Self-pay | Admitting: Pharmacist

## 2017-09-22 ENCOUNTER — Ambulatory Visit: Payer: Medicare Other | Admitting: Internal Medicine

## 2017-09-22 VITALS — BP 130/82 | HR 67 | Ht 72.0 in | Wt 165.0 lb

## 2017-09-22 DIAGNOSIS — Z95810 Presence of automatic (implantable) cardiac defibrillator: Secondary | ICD-10-CM

## 2017-09-22 DIAGNOSIS — I481 Persistent atrial fibrillation: Secondary | ICD-10-CM | POA: Insufficient documentation

## 2017-09-22 DIAGNOSIS — I4819 Other persistent atrial fibrillation: Secondary | ICD-10-CM

## 2017-09-22 DIAGNOSIS — I255 Ischemic cardiomyopathy: Secondary | ICD-10-CM

## 2017-09-22 DIAGNOSIS — I5022 Chronic systolic (congestive) heart failure: Secondary | ICD-10-CM | POA: Diagnosis not present

## 2017-09-22 NOTE — Patient Instructions (Addendum)
Medication Instructions:  Your physician recommends that you continue on your current medications as directed. Please refer to the Current Medication list given to you today.  Labwork: You will have labs drawn today: CBC and BMP and Mg  Testing/Procedures: You have been recommended to start Tikosyn which will require a three day admission. Review information sheet attached.  Follow-Up: Your physician recommends that you schedule a follow-up appointment in:   4 weeks from your Tikosyn admission.  Any Other Special Instructions Will Be Listed Below (If Applicable).     If you need a refill on your cardiac medications before your next appointment, please call your pharmacy.

## 2017-09-22 NOTE — Telephone Encounter (Signed)
Medication list reviewed in anticipation of upcoming Tikosyn initiation. Patient is not taking any contraindicated or QTc prolonging medications. He took amiodarone 200mg  BID for 2 weeks starting 09/06/17 and ending 09/17/17. Amiodarone level pending from today. [place] [facility] admission if level comes back < 0.3.  Patient is anticoagulated on Xarelto 20mg  daily on the appropriate dose (CrCl 75mL/min). Please ensure that patient has not missed any anticoagulation doses in the 3 weeks prior to Tikosyn initiation.   Patient will need to be counseled to avoid use of Benadryl while on Tikosyn and in the 2-3 days prior to Tikosyn initiation.

## 2017-09-22 NOTE — Progress Notes (Signed)
Electrophysiology Office Note   Date:  09/22/2017   ID:  Crystal, Ellwood [birth year], MRN [id number]  PCP:  [name], MD  Cardiologist:   Primary Electrophysiologist:  Virl Axe, MD    No chief complaint on file.    History of Present Illness:  Mark Clayton is a 77 y.o. male is seen in follow-up for an ICD implanted for primary prevention. He had a 6949-lead which was replaced with a right ventricular pace sense lead at the time of generator replacement. He has ischemic heart disease with prior bypass surgery.  Underwent stenting 6/16 with significant interval improvement   He also has persistent atrial fibrillation.  He had a failed cardioversion but then reverted spontaneously to sinus rhythm.   Over the last few months he has had recurrent problems with atrial fibrillation.  He failed cardioversion.  He was noted by echo to have severe left atrial enlargement and elected not to undertake dofetilide and was started as an outpatient on amiodarone.  (6/19-AF clinic) he was intolerant because of weakness and shortness of breath.  He stopped it on his own.  He feels much better but is not back to par   He remains considerably fatigued.  No CP  No Edema    DATE TEST EF    /14    Echo   20%   6/16    Myoview 30 %   6/16 Cath   20%  Patent LIMA to LAD. Patent jump graft SVG to OM 2 and OM 3. Patent SVG to PDA. The second portion of this graft which goes to the posterior lateral artery is occluded. >90% distal RCA stenosis  Successful drug-eluting stent    6/19 Echo 20-35% MR Mod  LAE 54/2.5/.54)   Date Cr K TSH LFTs Hgb  2/18   4.0   11.4  7/18 1.12       5/19 1.13 3.9 4.93  11.5               Past Medical History:  Diagnosis Date  . AICD (automatic cardioverter/defibrillator) present 01/17/2003   Medtronic Maximo 7232CX ICD, serial [id number]  . Anemia 02-06-11   takes oral iron  . Arthritis    hands, knees  . CAD (coronary artery disease) 2003   a. h/o MI and CABG in 2003. b. s/p DES to SVG-RPDA-RPLB in 08/2014.  Marland Kitchen Cancer of sigmoid colon ([place]) 2012   a. s/p colon surgery.  . Carotid bruit   . Chronic systolic CHF (congestive heart failure) (HCC)    a. EF 20% in 2014.  Marland Kitchen Cough   . GERD (gastroesophageal reflux disease) 02-06-11  . HTN (hypertension)   . Hyperlipidemia   . Ischemic cardiomyopathy    a. EF 20% in 2014. (Master study EF >20%)  . LV (left ventricular) mural thrombus    a. Last echo 12/2012 EF 20% with mural thrombus, mod-severe left atrial enlargement. Per Dr. Olin Pia notes from that time, echo in 2009 demonstrated something similar - he had been on warfarin following prior MI and this was stopped after a number of months. Given lack of data regarding anticoagulation from chronic clot, he has not been on anticoagulation since.  . Macular degeneration   . Myocardial infarct [facility])    2003   Past Surgical History:  Procedure Laterality Date  . CARDIAC CATHETERIZATION N/A 09/20/2014   Procedure: Left Heart Cath and Coronary Angiography;  Surgeon: Jettie Booze, MD; LAD 95%, D1 100%,  CFX liner percent, OM 200%, OM 390%, RCA 90%, LIMA-LAD okay, SVG-OM 2-OM 3 minimal disease, SVG-RPDA-RPLB 100% between the RPDA and RPL     . CARDIAC CATHETERIZATION N/A 09/20/2014   Procedure: Coronary Stent Intervention;  Surgeon: Jettie Booze, MD; Synergy DES 4 x 24 mm reducing the stenosis to 5%   . CARDIAC DEFIBRILLATOR PLACEMENT  01/17/03   6949 lead. Medtronic. remote-no; with later revision  . CARDIOVERSION N/A 05/22/2016   Procedure: CARDIOVERSION;  Surgeon: Lelon Perla, MD;  Location: [facility] ENDOSCOPY;  Service: Cardiovascular;  Laterality: N/A;  . CARDIOVERSION N/A 08/10/2017   Procedure: CARDIOVERSION;  Surgeon: Pixie Casino, MD;  Location: [facility] ENDOSCOPY;  Service: Cardiovascular;  Laterality: N/A;  . CATARACT EXTRACTION W/ INTRAOCULAR LENS  IMPLANT, BILATERAL Bilateral June/-July 2009   Dr. Katy Fitch  . COLON RESECTION   02/09/2011   Procedure: LAPAROSCOPIC SIGMOID COLON RESECTION;  Surgeon: Pedro Earls, MD;  Location: [facility] ORS;  Service: General;  Laterality: N/A;  Laparoscopic Assisted Sigmoid Colectomy  . COLON SURGERY    . COLONOSCOPY  08/31/2011   Procedure: COLONOSCOPY;  Surgeon: Jerene Bears, MD;  Location: [facility] ENDOSCOPY;  Service: Gastroenterology;  Laterality: N/A;  . COLONOSCOPY N/A 09/05/2012   Procedure: COLONOSCOPY;  Surgeon: Jerene Bears, MD;  Location: [facility] ENDOSCOPY;  Service: Gastroenterology;  Laterality: N/A;  . COLONOSCOPY N/A 04/18/2013   Procedure: COLONOSCOPY;  Surgeon: Jerene Bears, MD;  Location: [facility] ENDOSCOPY;  Service: Gastroenterology;  Laterality: N/A;  . COLONOSCOPY N/A 04/09/2014   Procedure: COLONOSCOPY;  Surgeon: Jerene Bears, MD;  Location: [facility] ENDOSCOPY;  Service: Gastroenterology;  Laterality: N/A;  . COLONOSCOPY WITH PROPOFOL N/A 05/03/2017   Procedure: COLONOSCOPY WITH PROPOFOL;  Surgeon: Jerene Bears, MD;  Location: [facility] ENDOSCOPY;  Service: Gastroenterology;  Laterality: N/A;  . CORONARY ANGIOPLASTY    . CORONARY ARTERY BYPASS GRAFT  01/2002   LIMA-LAD, SVG-OM 2-OM 3, SVG-RPDA-RPLB  . ICD GENERATOR CHANGE  2010   Medtronic Virtuoso II VR ICD  . INGUINAL HERNIA REPAIR Right 2000's X 2  . LAPAROSCOPIC RIGHT HEMI COLECTOMY N/A 11/04/2012   Procedure: LAPAROSCOPIC RIGHT HEMI COLECTOMY;  Surgeon: Pedro Earls, MD;  Location: [facility] ORS;  Service: General;  Laterality: N/A;  . TONSILLECTOMY  ~ 1950     Current Outpatient Medications  Medication Sig Dispense Refill  . carvedilol (COREG) 25 MG tablet TAKE 1 TABLET BY MOUTH AT  BEDTIME 90 tablet 1  . furosemide (LASIX) 40 MG tablet TAKE 1 TABLET BY MOUTH  DAILY 90 tablet 1  . isosorbide mononitrate (IMDUR) 30 MG 24 hr tablet Take 30 mg by mouth daily.    Marland Kitchen losartan (COZAAR) 50 MG tablet TAKE 1 TABLET BY MOUTH  DAILY 90 tablet 1  . meclizine (ANTIVERT) 25 MG tablet TAKE 1 TABLET BY MOUTH  DAILY AS NEEDED FOR  DIZZINESS. 60 tablet 1    . nitroGLYCERIN (NITROSTAT) 0.4 MG SL tablet DISSOLVE 1 TABLET UNDER THE TONGUE EVERY 5 MINUTES AS  NEEDED FOR CHEST PAIN UP TO 3 DOSES. CALL 911 IF CHEST  PAIN PERSISTS 75 tablet 3  . pantoprazole (PROTONIX) 40 MG tablet Take 1 tablet (40 mg total) by mouth daily. 90 tablet 3  . Polyethyl Glycol-Propyl Glycol (SYSTANE ULTRA) 0.4-0.3 % SOLN Place 1 drop into both eyes 2 (two) times daily as needed (for dry eyes).     . pravastatin (PRAVACHOL) 20 MG tablet Take 1 tablet (20 mg total) by mouth at bedtime. 90 tablet 3  .  PRESCRIPTION MEDICATION Inject 1 each as directed every 5 (five) weeks. Eye Injection every 5 weeks.     . rivaroxaban (XARELTO) 20 MG TABS tablet Take 1 tablet (20 mg total) by mouth daily with supper. 30 tablet 3  . VITAMIN K PO Take 1 tablet by mouth daily.     No current facility-administered medications for this visit.     Allergies:   Latex and Tape   Social History:  The patient  reports that he has never smoked. He has never used smokeless tobacco. He reports that he drinks about 1.2 oz of alcohol per week. He reports that he does not use drugs.   Family History:  The patient's family history includes Alzheimer's disease in his mother; Heart disease in his father; Hyperlipidemia in his father and sister; Hypertension in his father and sister; Prostate cancer in his father.    ROS:  Please see the history of present illness and past medical history  Otherwise, all other systems were reviewed and were negative .     PHYSICAL EXAM: VS:  BP 130/82   Pulse 67   Ht 6' (1.829 m)   Wt 165 lb (74.8 kg)   SpO2 99%   BMI 22.38 kg/m  , BMI Body mass index is 22.38 kg/m. Well developed and nourished in no acute distress HENT normal Neck supple with JVP-flat Carotids brisk and full without bruits Clear Irregularly irregular rate and rhythm with controlled ventricular response, no murmurs or gallops Abd-soft with active BS without hepatomegaly No Clubbing cyanosis  edema Skin-warm and dry A & Oriented  Grossly normal sensory and motor function   EKG: Atrial fibrillation at 67 Intervals-/17/47  IVCD  Device interrogation is reviewed today in detail.  See PaceArt for details.   Recent Labs: 07/19/2017: ALT 10; Pro B Natriuretic peptide (BNP) 704.0; TSH 4.93 08/06/2017: BUN 8; Creatinine, Ser 1.13; Hemoglobin 11.5; Platelets 163; Potassium 3.9; Sodium 140    Lipid Panel     Component Value Date/Time   CHOL 185 01/15/2017 0840   TRIG 100.0 01/15/2017 0840   HDL 52.60 01/15/2017 0840   CHOLHDL 4 01/15/2017 0840   VLDL 20.0 01/15/2017 0840   LDLCALC 112 (H) 01/15/2017 0840     Wt Readings from Last 3 Encounters:  09/22/17 165 lb (74.8 kg)  09/06/17 164 lb (74.4 kg)  08/20/17 166 lb (75.3 kg)      Other studies Reviewed: Additional studies/ records that were reviewed today include:  Myoview scan 2012 demonstrated large scar without ischemia EF was 29%      ASSESSMENT AND PLAN:  Ischemic cardiomyopathy  Implantable defibrillator-Medtronic-single chamber  The patient's device was interrogated.  The information was reviewed. No changes were made in the programming.    Hypertension   IVCD  Atrial fibrillation persistent  Congestive heart failure-chronic-systolic  PVCs  Anemia-chronic  Hyperlipidiemia  He was in nontolerant of amiodarone.  We have reviewed the safety data of dofetilide and outlined the benefits and the impact on long-term safety related to him patient initiation.  He is agreeable to proceeding with dofetilide.  Plan to admit him on Monday.  On Anticoagulation;  No bleeding issues   Euvolemic continue current meds  We spent more than 50% of our >25 min visit in face to face counseling regarding the above     Orders Placed This Encounter  Procedures  . EKG 12-Lead      Virl Axe, MD  09/22/2017 12:06 PM     [facility]  [place]  [phone] (office) [phone] (fax)

## 2017-09-23 LAB — CUP PACEART INCLINIC DEVICE CHECK
Battery Voltage: 2.65 V
Brady Statistic RV Percent Paced: 0.8 %
Date Time Interrogation Session: 20190626163306
HighPow Impedance: 43 Ohm
HighPow Impedance: 55 Ohm
Implantable Lead Implant Date: 20041020
Implantable Lead Implant Date: 20100913
Implantable Lead Location: 753860
Implantable Lead Location: 753860
Implantable Lead Model: 5076
Implantable Lead Model: 6949
Implantable Pulse Generator Implant Date: 20100913
Lead Channel Impedance Value: 494 Ohm
Lead Channel Pacing Threshold Amplitude: 1.5 V
Lead Channel Pacing Threshold Pulse Width: 0.8 ms
Lead Channel Sensing Intrinsic Amplitude: 31.625 mV
Lead Channel Setting Pacing Amplitude: 3 V
Lead Channel Setting Pacing Pulse Width: 0.8 ms
Lead Channel Setting Sensing Sensitivity: 0.45 mV

## 2017-09-23 LAB — CBC WITH DIFFERENTIAL/PLATELET
Basophils Absolute: 0 10*3/uL (ref 0.0–0.2)
Basos: 1 %
EOS (ABSOLUTE): 0.2 10*3/uL (ref 0.0–0.4)
Eos: 3 %
Hematocrit: 37.4 % — ABNORMAL LOW (ref 37.5–51.0)
Hemoglobin: 11.8 g/dL — ABNORMAL LOW (ref 13.0–17.7)
Immature Grans (Abs): 0 10*3/uL (ref 0.0–0.1)
Immature Granulocytes: 0 %
Lymphocytes Absolute: 1.5 10*3/uL (ref 0.7–3.1)
Lymphs: 25 %
MCH: 27.4 pg (ref 26.6–33.0)
MCHC: 31.6 g/dL (ref 31.5–35.7)
MCV: 87 fL (ref 79–97)
Monocytes Absolute: 0.8 10*3/uL (ref 0.1–0.9)
Monocytes: 13 %
Neutrophils Absolute: 3.5 10*3/uL (ref 1.4–7.0)
Neutrophils: 58 %
Platelets: 228 10*3/uL (ref 150–450)
RBC: 4.31 x10E6/uL (ref 4.14–5.80)
RDW: 15.5 % — ABNORMAL HIGH (ref 12.3–15.4)
WBC: 6 10*3/uL (ref 3.4–10.8)

## 2017-09-23 LAB — BASIC METABOLIC PANEL
BUN/Creatinine Ratio: 9 — ABNORMAL LOW (ref 10–24)
BUN: 13 mg/dL (ref 8–27)
CO2: 24 mmol/L (ref 20–29)
Calcium: 9.5 mg/dL (ref 8.6–10.2)
Chloride: 107 mmol/L — ABNORMAL HIGH (ref 96–106)
Creatinine, Ser: 1.5 mg/dL — ABNORMAL HIGH (ref 0.76–1.27)
GFR calc Af Amer: 52 mL/min/{1.73_m2} — ABNORMAL LOW (ref >59)
GFR calc non Af Amer: 45 mL/min/{1.73_m2} — ABNORMAL LOW (ref >59)
Glucose: 87 mg/dL (ref 65–99)
Potassium: 4.2 mmol/L (ref 3.5–5.2)
Sodium: 144 mmol/L (ref 134–144)

## 2017-09-23 LAB — MAGNESIUM: Magnesium: 2.2 mg/dL (ref 1.6–2.3)

## 2017-09-24 LAB — AMIODARONE LEVEL
Amiodarone Lvl: 0.3 ug/mL — ABNORMAL LOW (ref 1.0–2.5)
N-Desethyl-Amiodarone: 0.3 ug/mL — ABNORMAL LOW (ref 1.0–2.5)

## 2017-09-27 ENCOUNTER — Other Ambulatory Visit: Payer: Self-pay

## 2017-09-27 ENCOUNTER — Ambulatory Visit (HOSPITAL_COMMUNITY)
Admission: RE | Admit: 2017-09-27 | Discharge: 2017-09-27 | Disposition: A | Discharge status: Home / Self Care | Payer: Medicare Other | Source: Ambulatory Visit | Attending: Nurse Practitioner | Admitting: Nurse Practitioner

## 2017-09-27 ENCOUNTER — Encounter (HOSPITAL_COMMUNITY): Payer: Self-pay | Admitting: Nurse Practitioner

## 2017-09-27 ENCOUNTER — Inpatient Hospital Stay (HOSPITAL_COMMUNITY)
Admission: AD | Admit: 2017-09-27 | Discharge: 2017-09-30 | DRG: 309 | Disposition: A | Discharge status: Home / Self Care | Payer: Medicare Other | Attending: Internal Medicine | Admitting: Internal Medicine

## 2017-09-27 VITALS — BP 136/64 | HR 64 | Ht 72.0 in | Wt 162.0 lb

## 2017-09-27 DIAGNOSIS — I447 Left bundle-branch block, unspecified: Secondary | ICD-10-CM | POA: Diagnosis present

## 2017-09-27 DIAGNOSIS — Z8042 Family history of malignant neoplasm of prostate: Secondary | ICD-10-CM

## 2017-09-27 DIAGNOSIS — I5023 Acute on chronic systolic (congestive) heart failure: Secondary | ICD-10-CM | POA: Diagnosis not present

## 2017-09-27 DIAGNOSIS — I252 Old myocardial infarction: Secondary | ICD-10-CM | POA: Diagnosis not present

## 2017-09-27 DIAGNOSIS — I481 Persistent atrial fibrillation: Secondary | ICD-10-CM | POA: Diagnosis not present

## 2017-09-27 DIAGNOSIS — D631 Anemia in chronic kidney disease: Secondary | ICD-10-CM | POA: Diagnosis present

## 2017-09-27 DIAGNOSIS — M19042 Primary osteoarthritis, left hand: Secondary | ICD-10-CM | POA: Diagnosis present

## 2017-09-27 DIAGNOSIS — Z85038 Personal history of other malignant neoplasm of large intestine: Secondary | ICD-10-CM

## 2017-09-27 DIAGNOSIS — Z951 Presence of aortocoronary bypass graft: Secondary | ICD-10-CM

## 2017-09-27 DIAGNOSIS — Z79899 Other long term (current) drug therapy: Secondary | ICD-10-CM

## 2017-09-27 DIAGNOSIS — I130 Hypertensive heart and chronic kidney disease with heart failure and stage 1 through stage 4 chronic kidney disease, or unspecified chronic kidney disease: Secondary | ICD-10-CM | POA: Diagnosis present

## 2017-09-27 DIAGNOSIS — M170 Bilateral primary osteoarthritis of knee: Secondary | ICD-10-CM | POA: Diagnosis not present

## 2017-09-27 DIAGNOSIS — Z820 Family history of epilepsy and other diseases of the nervous system: Secondary | ICD-10-CM

## 2017-09-27 DIAGNOSIS — N183 Chronic kidney disease, stage 3 (moderate): Secondary | ICD-10-CM | POA: Diagnosis present

## 2017-09-27 DIAGNOSIS — I255 Ischemic cardiomyopathy: Secondary | ICD-10-CM | POA: Diagnosis not present

## 2017-09-27 DIAGNOSIS — E876 Hypokalemia: Secondary | ICD-10-CM | POA: Diagnosis not present

## 2017-09-27 DIAGNOSIS — I440 Atrioventricular block, first degree: Secondary | ICD-10-CM | POA: Diagnosis not present

## 2017-09-27 DIAGNOSIS — Z95810 Presence of automatic (implantable) cardiac defibrillator: Secondary | ICD-10-CM

## 2017-09-27 DIAGNOSIS — Z91048 Other nonmedicinal substance allergy status: Secondary | ICD-10-CM

## 2017-09-27 DIAGNOSIS — I2510 Atherosclerotic heart disease of native coronary artery without angina pectoris: Secondary | ICD-10-CM | POA: Diagnosis present

## 2017-09-27 DIAGNOSIS — Z9049 Acquired absence of other specified parts of digestive tract: Secondary | ICD-10-CM

## 2017-09-27 DIAGNOSIS — K219 Gastro-esophageal reflux disease without esophagitis: Secondary | ICD-10-CM | POA: Diagnosis not present

## 2017-09-27 DIAGNOSIS — Z961 Presence of intraocular lens: Secondary | ICD-10-CM | POA: Diagnosis present

## 2017-09-27 DIAGNOSIS — Z7901 Long term (current) use of anticoagulants: Secondary | ICD-10-CM

## 2017-09-27 DIAGNOSIS — M19041 Primary osteoarthritis, right hand: Secondary | ICD-10-CM | POA: Diagnosis not present

## 2017-09-27 DIAGNOSIS — Z8349 Family history of other endocrine, nutritional and metabolic diseases: Secondary | ICD-10-CM

## 2017-09-27 DIAGNOSIS — Z9841 Cataract extraction status, right eye: Secondary | ICD-10-CM

## 2017-09-27 DIAGNOSIS — Z9842 Cataract extraction status, left eye: Secondary | ICD-10-CM

## 2017-09-27 DIAGNOSIS — Z9089 Acquired absence of other organs: Secondary | ICD-10-CM

## 2017-09-27 DIAGNOSIS — I5022 Chronic systolic (congestive) heart failure: Secondary | ICD-10-CM | POA: Diagnosis not present

## 2017-09-27 DIAGNOSIS — E785 Hyperlipidemia, unspecified: Secondary | ICD-10-CM | POA: Diagnosis present

## 2017-09-27 DIAGNOSIS — Z5181 Encounter for therapeutic drug level monitoring: Secondary | ICD-10-CM | POA: Diagnosis not present

## 2017-09-27 DIAGNOSIS — Z955 Presence of coronary angioplasty implant and graft: Secondary | ICD-10-CM

## 2017-09-27 DIAGNOSIS — I493 Ventricular premature depolarization: Secondary | ICD-10-CM | POA: Diagnosis not present

## 2017-09-27 DIAGNOSIS — I4819 Other persistent atrial fibrillation: Secondary | ICD-10-CM

## 2017-09-27 DIAGNOSIS — Z8249 Family history of ischemic heart disease and other diseases of the circulatory system: Secondary | ICD-10-CM

## 2017-09-27 DIAGNOSIS — Z91040 Latex allergy status: Secondary | ICD-10-CM

## 2017-09-27 LAB — BASIC METABOLIC PANEL
Anion gap: 7 (ref 5–15)
BUN: 9 mg/dL (ref 8–23)
CO2: 29 mmol/L (ref 22–32)
Calcium: 9.4 mg/dL (ref 8.9–10.3)
Chloride: 105 mmol/L (ref 98–111)
Creatinine, Ser: 1.31 mg/dL — ABNORMAL HIGH (ref 0.61–1.24)
GFR calc Af Amer: 59 mL/min — ABNORMAL LOW (ref >60)
GFR calc non Af Amer: 51 mL/min — ABNORMAL LOW (ref >60)
Glucose, Bld: 88 mg/dL (ref 70–99)
Potassium: 3.9 mmol/L (ref 3.5–5.1)
Sodium: 141 mmol/L (ref 135–145)

## 2017-09-27 LAB — MAGNESIUM: Magnesium: 2.2 mg/dL (ref 1.7–2.4)

## 2017-09-27 MED ORDER — NITROGLYCERIN 0.4 MG SL SUBL: 0.4000 mg | SUBLINGUAL_TABLET | SUBLINGUAL | Status: DC | PRN

## 2017-09-27 MED ORDER — POLYVINYL ALCOHOL 1.4 % OP SOLN
1.0000 [drp] | Freq: Two times a day (BID) | OPHTHALMIC | Status: DC | PRN
Start: 2017-09-27 — End: 2017-09-30

## 2017-09-27 MED ORDER — SODIUM CHLORIDE 0.9 % IV SOLN: 250.0000 mL | INTRAVENOUS | Status: DC | PRN

## 2017-09-27 MED ORDER — DOFETILIDE 250 MCG PO CAPS
250.0000 ug | ORAL_CAPSULE | Freq: Two times a day (BID) | ORAL | Status: DC
  Administered 2017-09-27 – 2017-09-30 (×6): 250 ug via ORAL
  Filled 2017-09-27 (×6): qty 1

## 2017-09-27 MED ORDER — VITAMIN B-12 100 MCG PO TABS
100.0000 ug | ORAL_TABLET | ORAL | Status: DC
  Administered 2017-09-28 – 2017-09-30 (×3): 100 ug via ORAL
  Filled 2017-09-27 (×4): qty 1

## 2017-09-27 MED ORDER — SODIUM CHLORIDE 0.9% FLUSH
3.0000 mL | Freq: Two times a day (BID) | INTRAVENOUS | Status: DC
  Administered 2017-09-27 – 2017-09-30 (×3): 3 mL via INTRAVENOUS

## 2017-09-27 MED ORDER — SODIUM CHLORIDE 0.9% FLUSH: 3.0000 mL | INTRAVENOUS | Status: DC | PRN

## 2017-09-27 MED ORDER — CARVEDILOL 25 MG PO TABS
25.0000 mg | ORAL_TABLET | Freq: Every day | ORAL | Status: DC
  Administered 2017-09-27 – 2017-09-29 (×3): 25 mg via ORAL
  Filled 2017-09-27 (×3): qty 1

## 2017-09-27 MED ORDER — PRAVASTATIN SODIUM 20 MG PO TABS
20.0000 mg | ORAL_TABLET | Freq: Every day | ORAL | Status: DC
  Administered 2017-09-27 – 2017-09-29 (×3): 20 mg via ORAL
  Filled 2017-09-27 (×3): qty 1

## 2017-09-27 MED ORDER — POTASSIUM CHLORIDE CRYS ER 20 MEQ PO TBCR
20.0000 meq | EXTENDED_RELEASE_TABLET | Freq: Once | ORAL | Status: AC
  Administered 2017-09-27: 20 meq via ORAL
  Filled 2017-09-27: qty 1

## 2017-09-27 MED ORDER — LOSARTAN POTASSIUM 50 MG PO TABS
50.0000 mg | ORAL_TABLET | Freq: Every day | ORAL | Status: DC
  Administered 2017-09-28 – 2017-09-30 (×3): 50 mg via ORAL
  Filled 2017-09-27 (×3): qty 1

## 2017-09-27 MED ORDER — ISOSORBIDE MONONITRATE ER 30 MG PO TB24
30.0000 mg | ORAL_TABLET | Freq: Every day | ORAL | Status: DC
  Administered 2017-09-27 – 2017-09-29 (×3): 30 mg via ORAL
  Filled 2017-09-27 (×3): qty 1

## 2017-09-27 MED ORDER — RIVAROXABAN 20 MG PO TABS
20.0000 mg | ORAL_TABLET | Freq: Every day | ORAL | Status: DC
  Administered 2017-09-27 – 2017-09-29 (×3): 20 mg via ORAL
  Filled 2017-09-27 (×3): qty 1

## 2017-09-27 MED ORDER — FUROSEMIDE 40 MG PO TABS
40.0000 mg | ORAL_TABLET | Freq: Every day | ORAL | Status: DC
  Administered 2017-09-28 – 2017-09-30 (×3): 40 mg via ORAL
  Filled 2017-09-27 (×3): qty 1

## 2017-09-27 MED ORDER — PANTOPRAZOLE SODIUM 40 MG PO TBEC
40.0000 mg | DELAYED_RELEASE_TABLET | Freq: Every day | ORAL | Status: DC
  Administered 2017-09-28 – 2017-09-30 (×3): 40 mg via ORAL
  Filled 2017-09-27 (×3): qty 1

## 2017-09-27 NOTE — Progress Notes (Signed)
Primary Care Physician: [name], MD Referring Physician: Dr. Jani Files Mark Clayton is a 77 y.o. male with a h/o ICD, CAD,EF 20% paroxsymal afib that is in the afib clinic for  Tikosyn admit.  He had successful cardioversion 5/24, but had ERAF.  He was started on amiodarone when seen by Dr. Rayann Heman  6/19 for feeling poorly in afib. However, he did not tolerate amiodarone , only taking it for 2 weeks. He saw Dr. Caryl Comes back  6/26 and he recommended tikosyn admit. Pt is here today for admission for tikosyn. No missed doses of xarelto x 3 weeks. No benadryl use.  Labs are pending. Pt is aware of price of drug. Amio level returned ok at 0.3. He has been off amiodarone for almost 2 weeks.  Today, he denies symptoms of palpitations, chest pain, shortness of breath, orthopnea, PND, lower extremity edema, dizziness, presyncope, syncope, or neurologic sequela. The patient is tolerating medications without difficulties and is otherwise without complaint today.   Past Medical History:  Diagnosis Date  . AICD (automatic cardioverter/defibrillator) present 01/17/2003   Medtronic Maximo 7232CX ICD, serial [id number] S  . Anemia 02-06-11   takes oral iron  . Arthritis    hands, knees  . CAD (coronary artery disease) 2003   a. h/o MI and CABG in 2003. b. s/p DES to SVG-RPDA-RPLB in 08/2014.  Marland Kitchen Cancer of sigmoid colon ([place]) 2012   a. s/p colon surgery.  . Carotid bruit   . Chronic systolic CHF (congestive heart failure) (HCC)    a. EF 20% in 2014.  Marland Kitchen Cough   . GERD (gastroesophageal reflux disease) 02-06-11  . HTN (hypertension)   . Hyperlipidemia   . Ischemic cardiomyopathy    a. EF 20% in 2014. (Master study EF >20%)  . LV (left ventricular) mural thrombus    a. Last echo 12/2012 EF 20% with mural thrombus, mod-severe left atrial enlargement. Per Dr. Olin Pia notes from that time, echo in 2009 demonstrated something similar - he had been on warfarin following prior MI and this was stopped  after a number of months. Given lack of data regarding anticoagulation from chronic clot, he has not been on anticoagulation since.  . Macular degeneration   . Myocardial infarct [facility])    2003   Past Surgical History:  Procedure Laterality Date  . CARDIAC CATHETERIZATION N/A 09/20/2014   Procedure: Left Heart Cath and Coronary Angiography;  Surgeon: Jettie Booze, MD; LAD 95%, D1 100%, CFX liner percent, OM 200%, OM 390%, RCA 90%, LIMA-LAD okay, SVG-OM 2-OM 3 minimal disease, SVG-RPDA-RPLB 100% between the RPDA and RPL     . CARDIAC CATHETERIZATION N/A 09/20/2014   Procedure: Coronary Stent Intervention;  Surgeon: Jettie Booze, MD; Synergy DES 4 x 24 mm reducing the stenosis to 5%   . CARDIAC DEFIBRILLATOR PLACEMENT  01/17/03   6949 lead. Medtronic. remote-no; with later revision  . CARDIOVERSION N/A 05/22/2016   Procedure: CARDIOVERSION;  Surgeon: Lelon Perla, MD;  Location: [facility] ENDOSCOPY;  Service: Cardiovascular;  Laterality: N/A;  . CARDIOVERSION N/A 08/10/2017   Procedure: CARDIOVERSION;  Surgeon: Pixie Casino, MD;  Location: [facility] ENDOSCOPY;  Service: Cardiovascular;  Laterality: N/A;  . CATARACT EXTRACTION W/ INTRAOCULAR LENS  IMPLANT, BILATERAL Bilateral June/-July 2009   Dr. Katy Fitch  . COLON RESECTION  02/09/2011   Procedure: LAPAROSCOPIC SIGMOID COLON RESECTION;  Surgeon: Pedro Earls, MD;  Location: [facility];  Service: General;  Laterality: N/A;  Laparoscopic Assisted Sigmoid Colectomy  .  COLON SURGERY    . COLONOSCOPY  08/31/2011   Procedure: COLONOSCOPY;  Surgeon: Jerene Bears, MD;  Location: [facility] ENDOSCOPY;  Service: Gastroenterology;  Laterality: N/A;  . COLONOSCOPY N/A 09/05/2012   Procedure: COLONOSCOPY;  Surgeon: Jerene Bears, MD;  Location: [facility] ENDOSCOPY;  Service: Gastroenterology;  Laterality: N/A;  . COLONOSCOPY N/A 04/18/2013   Procedure: COLONOSCOPY;  Surgeon: Jerene Bears, MD;  [facility]: [facility] ENDOSCOPY;  Service: Gastroenterology;  Laterality: N/A;  .  COLONOSCOPY N/A 04/09/2014   Procedure: COLONOSCOPY;  Surgeon: Jerene Bears, MD;  Location: [facility] ENDOSCOPY;  Service: Gastroenterology;  Laterality: N/A;  . COLONOSCOPY WITH PROPOFOL N/A 05/03/2017   Procedure: COLONOSCOPY WITH PROPOFOL;  Surgeon: Jerene Bears, MD;  Location: [facility] ENDOSCOPY;  Service: Gastroenterology;  Laterality: N/A;  . CORONARY ANGIOPLASTY    . CORONARY ARTERY BYPASS GRAFT  01/2002   LIMA-LAD, SVG-OM 2-OM 3, SVG-RPDA-RPLB  . ICD GENERATOR CHANGE  2010   Medtronic Virtuoso II VR ICD  . INGUINAL HERNIA REPAIR Right 2000's X 2  . LAPAROSCOPIC RIGHT HEMI COLECTOMY N/A 11/04/2012   Procedure: LAPAROSCOPIC RIGHT HEMI COLECTOMY;  Surgeon: Pedro Earls, MD;  Location: [facility]S;  Service: General;  Laterality: N/A;  . TONSILLECTOMY  ~ 1950    No current outpatient medications on file.   No current facility-administered medications for this encounter.     Allergies  Allergen Reactions  . Latex Rash  . Tape Rash and Other (See Comments)    USE PAPER    Social History   Socioeconomic History  . Marital status: Widowed    Spouse name: Not on file  . Number of children: 3  . Years of education: Not on file  . Highest education level: Not on file  Occupational History  . Occupation: self employed  Social Needs  . Financial resource strain: Not on file  . Food insecurity:    Worry: Not on file    Inability: Not on file  . Transportation needs:    Medical: Not on file    Non-medical: Not on file  Tobacco Use  . Smoking status: Never Smoker  . Smokeless tobacco: Never Used  Substance and Sexual Activity  . Alcohol use: Yes    Alcohol/week: 1.2 oz    Types: 2 Cans of beer per week  . Drug use: No  . Sexual activity: Yes  Lifestyle  . Physical activity:    Days per week: Not on file    Minutes per session: Not on file  . Stress: Not on file  Relationships  . Social connections:    Talks on phone: Not on file    Gets together: Not on file    Attends religious  service: Not on file    Active member of club or organization: Not on file    Attends meetings of clubs or organizations: Not on file    Relationship status: Not on file  . Intimate partner violence:    Fear of current or ex partner: Not on file    Emotionally abused: Not on file    Physically abused: Not on file    Forced sexual activity: Not on file  Other Topics Concern  . Not on file  Social History Narrative   Full time. Married.     Family History  Problem Relation Age of Onset  . Hypertension Father   . Hyperlipidemia Father   . Heart disease Father   . Prostate cancer Father   . Alzheimer's  disease Mother   . Hypertension Sister   . Hyperlipidemia Sister   . Colon cancer Neg Hx   . Esophageal cancer Neg Hx   . Rectal cancer Neg Hx   . Stomach cancer Neg Hx     ROS- All systems are reviewed and negative except as per the HPI above  Physical Exam: Vitals:   09/27/17 1029  BP: 136/64  Pulse: 64  Weight: 162 lb (73.5 kg)  Height: 6' (1.829 m)   Wt Readings from Last 3 Encounters:  09/27/17 161 lb 9.6 oz (73.3 kg)  09/27/17 162 lb (73.5 kg)  09/22/17 165 lb (74.8 kg)    Labs: Lab Results  Component Value Date   NA 141 09/27/2017   K 3.9 09/27/2017   CL 105 09/27/2017   CO2 29 09/27/2017   GLUCOSE 88 09/27/2017   BUN 9 09/27/2017   CREATININE 1.31 (H) 09/27/2017   CALCIUM 9.4 09/27/2017   MG 2.2 09/27/2017   Lab Results  Component Value Date   INR 1.05 09/20/2014   Lab Results  Component Value Date   CHOL 185 01/15/2017   HDL 52.60 01/15/2017   LDLCALC 112 (H) 01/15/2017   TRIG 100.0 01/15/2017     GEN- The patient is well appearing, alert and oriented x 3 today.   Head- normocephalic, atraumatic Eyes-  Sclera clear, conjunctiva pink Ears- hearing intact Oropharynx- clear Neck- supple, no JVP Lymph- no cervical lymphadenopathy Lungs- Clear to ausculation bilaterally, normal work of breathing Heart- irregular rate and rhythm, no  murmurs, rubs or gallops, PMI not laterally displaced GI- soft, NT, ND, + BS Extremities- no clubbing, cyanosis, or edema MS- no significant deformity or atrophy Skin- no rash or lesion Psych- euthymic mood, full affect Neuro- strength and sensation are intact  EKG- afib with LBBB at 64 bpm, qrs int 174 ms, qtc 497 ms( LBBB contributing) Epic records reviewed Holter monitor reviewed    Assessment and Plan: 1. Afib Successful cardioversion 5/14 but ERAF Did not tolerate amiodarone, amio level ok at 0.3, off x 2 weeks, here for tikosyn admit General discussion re tiksoyn precautions, can afford drug Continue xarelto 20 mg a day with [id number] score of at least 5, states no missed doses Continue carvedilol Bmet with k+ at 3.9amd mag at 2.2, ok to admit, crcl cal at 49.86, indicating dose of 250 mcg tikosyn to start Drugs screened by PharmD and no baseline qtc prolonging drugs  Aware of price of drug No benadryl use  2. CHF Weight stable Continue diuretic Avoid salt Daily weights     Butch Penny C. Kyl Givler, [facility] [address] [place],  [address] [phone]

## 2017-09-27 NOTE — H&P (Addendum)
Cardiology Admission History and Physical:   Patient ID: Mark Clayton; MRN: [id number]; DOB: 1940-07-07   Admission date: 09/27/2017  Primary Care Provider: [name], MD Primary Cardiologist/Primary Electrophysiologist:  Dr. Caryl Comes  Chief Complaint:  Sandria Manly admission  Patient Profile:   Mark Clayton is a 77 y.o. male with a history of CAD (CABG and prior PCIs), persistent AFib, HTN, HLD, GERD, arthritis, chronic anemia, chronic CHF (systolic), ICM w/ICD  History of Present Illness:   Mark Clayton was last seen by Dr. Caryl Comes last month, he was briefly on amiodarone (2 weeks) though intolerant 2/2 weakness/SOB, and stopped, he was referred to the AFib clinic to arrange Tikosyn initiation.  The patient feels fatigued in AFib, no CP, palpitations or SOB.  He confirms no missed doses of his Xarelto in the last 4 weeks.  The patient reports both Dr. Caryl Comes and Roderic Palau, NP in the AFib clinic discussed Tikosyn protocol, potential benefits/frisks, and he would like to proceed.  Device information: MDT single chamber ICD, implanted initially 2004, had 6949 lead, with new lead at gen change in 2010  Past Medical History:  Diagnosis Date  . AICD (automatic cardioverter/defibrillator) present 01/17/2003   Medtronic Maximo 7232CX ICD, serial [id number]  . Anemia 02-06-11   takes oral iron  . Arthritis    hands, knees  . CAD (coronary artery disease) 2003   a. h/o MI and CABG in 2003. b. s/p DES to SVG-RPDA-RPLB in 08/2014.  Marland Kitchen Cancer of sigmoid colon ([place]) 2012   a. s/p colon surgery.  . Carotid bruit   . Chronic systolic CHF (congestive heart failure) (HCC)    a. EF 20% in 2014.  Marland Kitchen Cough   . GERD (gastroesophageal reflux disease) 02-06-11  . HTN (hypertension)   . Hyperlipidemia   . Ischemic cardiomyopathy    a. EF 20% in 2014. (Master study EF >20%)  . LV (left ventricular) mural thrombus    a. Last echo 12/2012 EF 20% with mural thrombus, mod-severe left atrial  enlargement. Per Dr. Olin Pia notes from that time, echo in 2009 demonstrated something similar - he had been on warfarin following prior MI and this was stopped after a number of months. Given lack of data regarding anticoagulation from chronic clot, he has not been on anticoagulation since.  . Macular degeneration   . Myocardial infarct [facility])    2003    Past Surgical History:  Procedure Laterality Date  . CARDIAC CATHETERIZATION N/A 09/20/2014   Procedure: Left Heart Cath and Coronary Angiography;  Surgeon: Jettie Booze, MD; LAD 95%, D1 100%, CFX liner percent, OM 200%, OM 390%, RCA 90%, LIMA-LAD okay, SVG-OM 2-OM 3 minimal disease, SVG-RPDA-RPLB 100% between the RPDA and RPL     . CARDIAC CATHETERIZATION N/A 09/20/2014   Procedure: Coronary Stent Intervention;  Surgeon: Jettie Booze, MD; Synergy DES 4 x 24 mm reducing the stenosis to 5%   . CARDIAC DEFIBRILLATOR PLACEMENT  01/17/03   6949 lead. Medtronic. remote-no; with later revision  . CARDIOVERSION N/A 05/22/2016   Procedure: CARDIOVERSION;  Surgeon: Lelon Perla, MD;  Location: [facility] ENDOSCOPY;  Service: Cardiovascular;  Laterality: N/A;  . CARDIOVERSION N/A 08/10/2017   Procedure: CARDIOVERSION;  Surgeon: Pixie Casino, MD;  Location: [facility] ENDOSCOPY;  Service: Cardiovascular;  Laterality: N/A;  . CATARACT EXTRACTION W/ INTRAOCULAR LENS  IMPLANT, BILATERAL Bilateral June/-July 2009   Dr. Katy Fitch  . COLON RESECTION  02/09/2011   Procedure: LAPAROSCOPIC SIGMOID COLON RESECTION;  Surgeon: Isabel Caprice  Hassell Done, MD;  Location: [facility] ORS;  Service: General;  Laterality: N/A;  Laparoscopic Assisted Sigmoid Colectomy  . COLON SURGERY    . COLONOSCOPY  08/31/2011   Procedure: COLONOSCOPY;  Surgeon: Jerene Bears, MD;  Location: [facility] ENDOSCOPY;  Service: Gastroenterology;  Laterality: N/A;  . COLONOSCOPY N/A 09/05/2012   Procedure: COLONOSCOPY;  Surgeon: Jerene Bears, MD;  Location: [facility] ENDOSCOPY;  Service: Gastroenterology;  Laterality: N/A;  .  COLONOSCOPY N/A 04/18/2013   Procedure: COLONOSCOPY;  Surgeon: Jerene Bears, MD;  Location: [facility] ENDOSCOPY;  Service: Gastroenterology;  Laterality: N/A;  . COLONOSCOPY N/A 04/09/2014   Procedure: COLONOSCOPY;  Surgeon: Jerene Bears, MD;  Location: [facility] ENDOSCOPY;  Service: Gastroenterology;  Laterality: N/A;  . COLONOSCOPY WITH PROPOFOL N/A 05/03/2017   Procedure: COLONOSCOPY WITH PROPOFOL;  Surgeon: Jerene Bears, MD;  Location: [facility] ENDOSCOPY;  Service: Gastroenterology;  Laterality: N/A;  . CORONARY ANGIOPLASTY    . CORONARY ARTERY BYPASS GRAFT  01/2002   LIMA-LAD, SVG-OM 2-OM 3, SVG-RPDA-RPLB  . ICD GENERATOR CHANGE  2010   Medtronic Virtuoso II VR ICD  . INGUINAL HERNIA REPAIR Right 2000's X 2  . LAPAROSCOPIC RIGHT HEMI COLECTOMY N/A 11/04/2012   Procedure: LAPAROSCOPIC RIGHT HEMI COLECTOMY;  Surgeon: Pedro Earls, MD;  Location: [facility] ORS;  Service: General;  Laterality: N/A;  . TONSILLECTOMY  ~ 1950     Medications Prior to Admission: Prior to Admission medications   Medication Sig Start Date End Date Taking? Authorizing Provider  carvedilol (COREG) 25 MG tablet TAKE 1 TABLET BY MOUTH AT  BEDTIME 05/20/17   [name], MD  furosemide (LASIX) 40 MG tablet TAKE 1 TABLET BY MOUTH  DAILY 05/20/17   [name], MD  isosorbide mononitrate (IMDUR) 30 MG 24 hr tablet Take 30 mg by mouth daily.    [provider]  losartan (COZAAR) 50 MG tablet TAKE 1 TABLET BY MOUTH  DAILY 06/14/17   [name], MD  meclizine (ANTIVERT) 25 MG tablet TAKE 1 TABLET BY MOUTH  DAILY AS NEEDED FOR  DIZZINESS. 06/14/17   [name], MD  nitroGLYCERIN (NITROSTAT) 0.4 MG SL tablet DISSOLVE 1 TABLET UNDER THE TONGUE EVERY 5 MINUTES AS  NEEDED FOR CHEST PAIN UP TO 3 DOSES. CALL 911 IF CHEST  PAIN PERSISTS 08/20/17   Sherran Needs, NP  pantoprazole (PROTONIX) 40 MG tablet Take 1 tablet (40 mg total) by mouth daily. 01/15/17   [name], MD  Polyethyl Glycol-Propyl  Glycol (SYSTANE ULTRA) 0.4-0.3 % SOLN Place 1 drop into both eyes 2 (two) times daily as needed (for dry eyes).     [provider]  pravastatin (PRAVACHOL) 20 MG tablet Take 1 tablet (20 mg total) by mouth at bedtime. 01/15/17   [name], MD  PRESCRIPTION MEDICATION Inject 1 each as directed every 5 (five) weeks. Eye Injection every 5 weeks.     [provider]  rivaroxaban (XARELTO) 20 MG TABS tablet Take 1 tablet (20 mg total) by mouth daily with supper. 01/18/17   Sherran Needs, NP  VITAMIN K PO Take 1 tablet by mouth daily.    [provider]     Allergies:    Allergies  Allergen Reactions  . Latex Rash  . Tape Rash and Other (See Comments)    USE PAPER    Social History:   Social History   Socioeconomic History  . Marital status: Widowed    Spouse name: Not on  file  . Number of children: 3  . Years of education: Not on file  . Highest education level: Not on file  Occupational History  . Occupation: self employed  Social Needs  . Financial resource strain: Not on file  . Food insecurity:    Worry: Not on file    Inability: Not on file  . Transportation needs:    Medical: Not on file    Non-medical: Not on file  Tobacco Use  . Smoking status: Never Smoker  . Smokeless tobacco: Never Used  Substance and Sexual Activity  . Alcohol use: Yes    Alcohol/week: 1.2 oz    Types: 2 Cans of beer per week  . Drug use: No  . Sexual activity: Yes  Lifestyle  . Physical activity:    Days per week: Not on file    Minutes per session: Not on file  . Stress: Not on file  Relationships  . Social connections:    Talks on phone: Not on file    Gets together: Not on file    Attends religious service: Not on file    Active member of club or organization: Not on file    Attends meetings of clubs or organizations: Not on file    Relationship status: Not on file  . Intimate partner violence:    Fear of current or ex partner: Not on file      Emotionally abused: Not on file    Physically abused: Not on file    Forced sexual activity: Not on file  Other Topics Concern  . Not on file  Social History Narrative   Full time. Married.     Family History:   The patient's family history includes Alzheimer's disease in his mother; Heart disease in his father; Hyperlipidemia in his father and sister; Hypertension in his father and sister; Prostate cancer in his father. There is no history of Colon cancer, Esophageal cancer, Rectal cancer, or Stomach cancer.    ROS:  Please see the history of present illness.  All other ROS reviewed and negative.     Physical Exam/Data:   Vitals:   09/27/17 1221  BP: 137/77  Pulse: 68  Resp: 18  SpO2: 100%  Weight: 161 lb 9.6 oz (73.3 kg)  Height: 6' (1.829 m)   No intake or output data in the 24 hours ending 09/27/17 1333 Filed Weights   09/27/17 1221  Weight: 161 lb 9.6 oz (73.3 kg)   Body mass index is 21.92 kg/m.  General:  Well nourished, well developed, in no acute distress HEENT: normal Lymph: no adenopathy Neck: no JVD Endocrine:  No thryomegaly Vascular: No carotid bruits  Cardiac:  Irreg-irreg; no murmurs, gallops or rubs Lungs: CTA b/l, no wheezing, rhonchi or rales  Abd: soft, nontender  Ext: no edema Musculoskeletal:  No deformities, age appropriately atrophy Skin: warm and dry  Neuro:   No gross focal abnormalities noted Psych:  Normal affect    EKG:  The ECG that was done today was personally reviewed and demonstrates  Afib, 64bpm, LBBB, QTc 497 (acceptable given LBBB QRS 158ms and ICD in place)  Relevant CV Studies:  09/03/17: TTE Study Conclusions - Left ventricle: The cavity size was severely dilated. Wall   thickness was increased in a pattern of mild LVH. Systolic   function was severely reduced. The estimated ejection fraction   was in the range of 20% to 25%. Diffuse hypokinesis. Doppler   parameters are consistent with restrictive  left  ventricular   relaxation (grade 3 diastolic dysfunction). The E/e&' ratio is   >20, suggesting markedly elevated LV filling pressure. - Aortic valve: Sclerosis without stenosis. There was trivial   regurgitation. - Mitral valve: Calcified annulus. Mildly thickened leaflets .   There was moderate regurgitation. - Left atrium: Severely dilated. - Right ventricle: The cavity size was mildly dilated. Mildly   reduced systolic function. AICD wire noted in right ventricle. - Right atrium: The atrium was mildly dilated. AICD wire noted in   right atrium. - Tricuspid valve: There was moderate regurgitation. - Pulmonary arteries: PA peak pressure: 50 mm Hg (S). - Inferior vena cava: The vessel was dilated. The respirophasic   diameter changes were blunted (< 50%), consistent with elevated   central venous pressure. Impressions: - Compared to a prior study in 2014, the LVEF is unchanged at   20-25%. An AICD is in place. There is severe LAE and mild RAE,   grade 3 DD with elevated LV filling pressure and moderate TR with   an RVSP of 50 mmHg.  Laboratory Data:  Chemistry Recent Labs  Lab 09/22/17 1301 09/27/17 1045  NA 144 141  K 4.2 3.9  CL 107* 105  CO2 24 29  GLUCOSE 87 88  BUN 13 9  CREATININE 1.50* 1.31*  CALCIUM 9.5 9.4  GFRNONAA 45* 51*  GFRAA 52* 59*  ANIONGAP  --  7    No results for input(s): PROT, ALBUMIN, AST, ALT, ALKPHOS, BILITOT in the last 168 hours. Hematology Recent Labs  Lab 09/22/17 1301  WBC 6.0  RBC 4.31  HGB 11.8*  HCT 37.4*  MCV 87  MCH 27.4  MCHC 31.6  RDW 15.5*  PLT 228   Cardiac EnzymesNo results for input(s): TROPONINI in the last 168 hours. No results for input(s): TROPIPOC in the last 168 hours.  BNPNo results for input(s): BNP, PROBNP in the last 168 hours.  DDimer No results for input(s): DDIMER in the last 168 hours.  Radiology/Studies:  No results found.  Assessment and Plan:   1. Persistent AFib     CHA2DS2Vasc is 4, on  xarelto     K+ 3.9 (Aveya Beal replace, no need to repeat before starting drug)     Mag 2.2     Creat 1.31 (Calc CrCl 50)     QTc given IVCD and ICD is OK to start     Amiodarone level was 0.3 on 09/22/17.  Discussed with Dr. Curt Bears, [place] to start  Plan DCCV Wed if not in SR  2. CAD     No anginal complaints     Continue home meds  3. HTN     Continue home meds  4. ICM     STM ICD in place     No exam findings to suggest fluid Ol     Continue home meds    For questions or updates, please contact [place] Please consult [email or web address] for contact info under Cardiology/STEMI.    Signed, Baldwin Jamaica, PA-C  09/27/2017 1:33 PM   I have seen and examined this patient with Tommye Standard.  Agree with above, note added to reflect my findings.  On exam, iRRR, no murmurs, lungs clear.  Patient presented to the hospital for dofetilide loading.  QTC is stable.  He does have chronic kidney disease and thus we Annita Ratliff start him at 250 mcg tonight.  We Alette Kataoka monitor EKGs after each dose.  Monya Kozakiewicz M. Anaelle Dunton MD 09/27/2017 4:04 PM

## 2017-09-27 NOTE — Progress Notes (Signed)
Pharmacy Review for Dofetilide (Tikosyn) Initiation  Admit Complaint: 77 y.o. male admitted 09/27/2017 with atrial fibrillation to be initiated on dofetilide.   Assessment:  Patient Exclusion Criteria: If any screening criteria checked as "Yes", then  patient  should NOT receive dofetilide until criteria item is corrected. If "Yes" please indicate correction plan.  YES  NO Patient  Exclusion Criteria Correction Plan  []  [x]  Baseline QTc interval is greater than or equal to 440 msec. IF above YES box checked dofetilide contraindicated unless patient has ICD; then may proceed if QTc 500-550 msec or with known ventricular conduction abnormalities may proceed with QTc 550-600 msec. QTc =   497   [x]  [x]  Magnesium level is less than 1.8 mEq/l : Last magnesium:  Lab Results  Component Value Date   MG 2.2 09/27/2017         [x]  []  Potassium level is less than 4 mEq/l : Last potassium:  Lab Results  Component Value Date   K 3.9 09/27/2017       Replacing with KCL 64meq po x1 now/   no need to repeat before starting drug) per Dr. Curt Bears   []  [x]  Patient is known or suspected to have a digoxin level greater than 2 ng/ml: No results found for: DIGOXIN    []  [x]  Creatinine clearance less than 20 ml/min (calculated using Cockcroft-Gault, actual body weight and serum creatinine): Estimated Creatinine Clearance: 49.7 mL/min (A) (by C-G formula based on SCr of 1.31 mg/dL (H)).    []  [x]  Patient has received drugs known to prolong the QT intervals within the last 48 hours (phenothiazines, tricyclics or tetracyclic antidepressants, erythromycin, H-1 antihistamines, cisapride, fluoroquinolones, azithromycin). Drugs not listed above may have an, as yet, undetected potential to prolong the QT interval, updated information on QT prolonging agents is available at this website:QT prolonging agents   []  [x]  Patient received a dose of hydrochlorothiazide (Oretic) alone or in any combination including  triamterene (Dyazide, Maxzide) in the last 48 hours.   []  [x]  Patient received a medication known to increase dofetilide plasma concentrations prior to initial dofetilide dose:  . Trimethoprim (Primsol, Proloprim) in the last 36 hours . Verapamil (Calan, Verelan) in the last 36 hours or a sustained release dose in the last 72 hours . Megestrol (Megace) in the last 5 days  . Cimetidine (Tagamet) in the last 6 hours . Ketoconazole (Nizoral) in the last 24 hours . Itraconazole (Sporanox) in the last 48 hours  . Prochlorperazine (Compazine) in the last 36 hours    []  [x]  Patient is known to have a history of torsades de pointes; congenital or acquired long QT syndromes.   []  [x]  Patient has received a Class 1 antiarrhythmic with less than 2 half-lives since last dose. (Disopyramide, Quinidine, Procainamide, Lidocaine, Mexiletine, Flecainide, Propafenone)   [x]  []  Patient has received amiodarone therapy in the past 3 months or amiodarone level is greater than 0.3 ng/ml.   09/22/17 level = 0.3 ng/ml 09/22/17 level = 0.3 ng/ml- okay  PA-C discussed with Dr. Curt Bears, [place] to start.     Patient has been appropriately anticoagulated with Xarelto 20mg  daily. Ordering provider was confirmed at [email or web address] if they are not listed on the [employer] Prescribers list.  Goal of Therapy: Follow renal function, electrolytes, potential drug interactions, and dose adjustment. Provide education and 1 week supply at discharge.  Plan:  [x]   Physician selected initial dose within range recommended for patients level of renal function - will monitor for response.  []   Physician selected initial dose outside of range recommended for patients level of renal function - will discuss if the dose should be altered at this time.   Select One Calculated CrCl  Dose q12h  []  > 60 ml/min 500 mcg  [x]  40-60 ml/min 250 mcg  []  20-40 ml/min 125 mcg   2. Follow up QTc after the first 5 doses, renal function,  electrolytes (K & Mg) daily x 3     days, dose adjustment, success of initiation and facilitate 1 week discharge supply as     clinically indicated.  3. Initiate Tikosyn education video (Call [phone] and ask for Tikosyn Video # 116).  4. Place Enrollment Form on the chart for discharge supply of dofetilide.  Nicole Cella, [place] Clinical Pharmacist Pager: [phone] 8:00-3:30 PM: [phone] 3:30-10:00 PM: [phone] After 10PM, [place] [phone]  3:17 PM 09/27/2017

## 2017-09-28 ENCOUNTER — Encounter: Payer: Medicare Other | Admitting: Internal Medicine

## 2017-09-28 ENCOUNTER — Encounter (HOSPITAL_COMMUNITY): Payer: Self-pay

## 2017-09-28 LAB — BASIC METABOLIC PANEL
Anion gap: 5 (ref 5–15)
BUN: 15 mg/dL (ref 8–23)
CO2: 27 mmol/L (ref 22–32)
Calcium: 8.6 mg/dL — ABNORMAL LOW (ref 8.9–10.3)
Chloride: 105 mmol/L (ref 98–111)
Creatinine, Ser: 1.32 mg/dL — ABNORMAL HIGH (ref 0.61–1.24)
GFR calc Af Amer: 59 mL/min — ABNORMAL LOW (ref >60)
GFR calc non Af Amer: 51 mL/min — ABNORMAL LOW (ref >60)
Glucose, Bld: 121 mg/dL — ABNORMAL HIGH (ref 70–99)
Potassium: 3.8 mmol/L (ref 3.5–5.1)
Sodium: 137 mmol/L (ref 135–145)

## 2017-09-28 LAB — MAGNESIUM: Magnesium: 2.1 mg/dL (ref 1.7–2.4)

## 2017-09-28 MED ORDER — SODIUM CHLORIDE 0.9% FLUSH
3.0000 mL | INTRAVENOUS | Status: DC | PRN
Start: 2017-09-28 — End: 2017-09-30

## 2017-09-28 MED ORDER — SODIUM CHLORIDE 0.9 % IV SOLN: 250.0000 mL | INTRAVENOUS | Status: DC

## 2017-09-28 MED ORDER — POTASSIUM CHLORIDE CRYS ER 20 MEQ PO TBCR
40.0000 meq | EXTENDED_RELEASE_TABLET | Freq: Once | ORAL | Status: AC
  Administered 2017-09-28: 40 meq via ORAL
  Filled 2017-09-28: qty 2

## 2017-09-28 MED ORDER — SPIRONOLACTONE 12.5 MG HALF TABLET
12.5000 mg | ORAL_TABLET | Freq: Every day | ORAL | Status: DC
Start: 2017-09-28 — End: 2017-09-30
  Administered 2017-09-28 – 2017-09-30 (×3): 12.5 mg via ORAL
  Filled 2017-09-28 (×3): qty 1

## 2017-09-28 MED ORDER — SODIUM CHLORIDE 0.9% FLUSH
3.0000 mL | Freq: Two times a day (BID) | INTRAVENOUS | Status: DC
  Administered 2017-09-28 – 2017-09-29 (×3): 3 mL via INTRAVENOUS

## 2017-09-28 MED ORDER — HYDROCORTISONE 1 % EX CREA
1.0000 "application " | TOPICAL_CREAM | Freq: Three times a day (TID) | CUTANEOUS | Status: DC | PRN
  Filled 2017-09-28: qty 28

## 2017-09-28 NOTE — Progress Notes (Signed)
Progress Note  Patient Name: Mark Clayton Date of Encounter: 09/28/2017  Primary Cardiologist: SK  Primary Electrophysiologist: SK   Patient Profile     77 y.o. male w ICD, CAD,EF 20% paroxsymal afib admitted for dofetilide having been intolerant of amio DATE TEST EF    /14    Echo   20%   6/16    Myoview 30 %   6/16 Cath   20%  Patent LIMA to LAD. Patent jump graft SVG to OM 2 and OM 3. Patent SVG to PDA. The second portion of this graft which goes to the posterior lateral artery is occluded. >90% distal RCA stenosis  Successful drug-eluting stent    6/19 Echo 20-35% MR Mod  LAE 54/2.5/.54)     Subjective   Some nausea with first dose of dofetilide   otherwise without complaints of chest pain or shortness of breath  Inpatient Medications    Scheduled Meds: . carvedilol  25 mg Oral QHS  . dofetilide  250 mcg Oral BID  . furosemide  40 mg Oral Daily  . isosorbide mononitrate  30 mg Oral QHS  . losartan  50 mg Oral Daily  . pantoprazole  40 mg Oral Daily  . potassium chloride  40 mEq Oral Once  . pravastatin  20 mg Oral QHS  . rivaroxaban  20 mg Oral Q supper  . sodium chloride flush  3 mL Intravenous Q12H  . vitamin B-12  100 mcg Oral BH-q7a   Continuous Infusions: . sodium chloride     PRN Meds: sodium chloride, nitroGLYCERIN, polyvinyl alcohol, sodium chloride flush   Vital Signs    Vitals:   09/27/17 1221 09/27/17 2015 09/27/17 2105 09/28/17 0600  BP: 137/77 126/70 139/72 121/67  Pulse: 68 (!) 55  (!) 55  Resp: 18 16    Temp:  98.9 F (37.2 C)  (!) 97.4 F (36.3 C)  TempSrc:  Oral  Oral  SpO2: 100% 96%  98%  Weight: 161 lb 9.6 oz (73.3 kg)   162 lb 1.6 oz (73.5 kg)  Height: 6' (1.829 m)       Intake/Output Summary (Last 24 hours) at 09/28/2017 1937 Last data filed at 09/27/2017 2226 Gross per 24 hour  Intake 243 ml  Output -  Net 243 ml   Filed Weights   09/27/17 1221 09/28/17 0600  Weight: 161 lb 9.6 oz (73.3 kg) 162 lb 1.6 oz (73.5 kg)      Telemetry    Atrial fibrillation with rates in the 50s-60s- Personally Reviewed  ECG    QTC within range personally Reviewed  Physical Exam   GEN: No acute distress.   Neck: JVD flat Cardiac:  IrregularLY irregular RR, no  murmurs, rubs, or gallops.  Respiratory: Clear to auscultation bilaterally. GI: Soft, nontender, non-distended  MS:  edema; No deformity. Neuro:  Nonfocal  Psych: Normal affect  Skin Warm and dry   Labs    Chemistry Recent Labs  Lab 09/22/17 1301 09/27/17 1045 09/28/17 0327  NA 144 141 137  K 4.2 3.9 3.8  CL 107* 105 105  CO2 24 29 27   GLUCOSE 87 88 121*  BUN 13 9 15   CREATININE 1.50* 1.31* 1.32*  CALCIUM 9.5 9.4 8.6*  GFRNONAA 45* 51* 51*  GFRAA 52* 59* 59*  ANIONGAP  --  7 5     Hematology Recent Labs  Lab 09/22/17 1301  WBC 6.0  RBC 4.31  HGB 11.8*  HCT 37.4*  MCV 87  MCH 27.4  MCHC 31.6  RDW 15.5*  PLT 228    Cardiac EnzymesNo results for input(s): TROPONINI in the last 168 hours. No results for input(s): TROPIPOC in the last 168 hours.   BNPNo results for input(s): BNP, PROBNP in the last 168 hours.   DDimer No results for input(s): DDIMER in the last 168 hours.   Radiology    No results found.       Assessment & Plan      Ischemic cardiomyopathy  Implantable defibrillator-Medtronic-single chamber  The patient's device was interrogated.  The information was reviewed. No changes were made in the programming.    Hypertension   IVCD  Atrial fibrillation persistent  Congestive heart failure-chronic-systolic  PVCs  Anemia-chronic  Renal insufficiency gd 3  Hypokalemia   Continue dofetilide dosed appropriately for renal function   Add aldactone   If BP to spare thereafter will consider entresto     Signed, Virl Axe, MD  09/28/2017, 7:12 AM

## 2017-09-28 NOTE — Progress Notes (Signed)
Post dose EKG today reviewed, SB 55bpm, measured QT 556ms, QRS 158ms.  Given BBB/QRS duration QTc is OK, 455ms. Pt has an ICD  Tommye Standard, PA-C

## 2017-09-28 NOTE — Discharge Instructions (Addendum)
You have an appointment set up with the Atrial Fibrillation Clinic.  Multiple studies have shown that being followed by a dedicated atrial fibrillation clinic in addition to the standard care you receive from your other physicians improves health. We believe that enrollment in the atrial fibrillation clinic will allow us to better care for you.  ° °The phone number to the Atrial Fibrillation Clinic is [phone]. The clinic is staffed Monday through Friday from 8:30am to 5pm. ° °Parking Directions: The clinic is located in the Heart and Vascular Building connected to [facility]. °1)From [address] turn on to [address] and go to the 3rd entrance  (Heart and Vascular entrance) on the right. °2)Look to the right for Heart &Vascular Parking Garage. °3)A code for the entrance is required please call the clinic to receive this.   °4)Take the elevators to the 1st floor. Registration is in the room with the glass walls at the end of the hallway. ° °If you have any trouble parking or locating the clinic, please don’t hesitate to call [phone]. ° °Information on my medicine - XARELTO® (Rivaroxaban) ° °Why was Xarelto® prescribed for you? °Xarelto® was prescribed for you to reduce the risk of a blood clot forming that can cause a stroke if you have a medical condition called atrial fibrillation (a type of irregular heartbeat). ° °What do you need to know about xarelto® ? °Take your Xarelto® ONCE DAILY at the same time every day with your evening meal. °If you have difficulty swallowing the tablet whole, you may crush it and mix in applesauce just prior to taking your dose. ° °Take Xarelto® exactly as prescribed by your doctor and DO NOT stop taking Xarelto® without talking to the doctor who prescribed the medication.  Stopping without other stroke prevention medication to take the place of Xarelto® may increase your risk of developing a clot that causes a stroke.  Refill your prescription before you  run out. ° °After discharge, you should have regular check-up appointments with your healthcare provider that is prescribing your Xarelto®.  In the future your dose may need to be changed if your kidney function or weight changes by a significant amount. ° °What do you do if you miss a dose? °If you are taking Xarelto® ONCE DAILY and you miss a dose, take it as soon as you remember on the same day then continue your regularly scheduled once daily regimen the next day. Do not take two doses of Xarelto® at the same time or on the same day.  ° °Important Safety Information °A possible side effect of Xarelto® is bleeding. You should call your healthcare provider right away if you experience any of the following: °? Bleeding from an injury or your nose that does not stop. °? Unusual colored urine (red or dark Foulkes) or unusual colored stools (red or black). °? Unusual bruising for unknown reasons. °? A serious fall or if you hit your head (even if there is no bleeding). ° °Some medicines may interact with Xarelto® and might increase your risk of bleeding while on Xarelto®. To help avoid this, consult your healthcare provider or pharmacist prior to using any new prescription or non-prescription medications, including herbals, vitamins, non-steroidal anti-inflammatory drugs (NSAIDs) and supplements. ° °This website has more information on Xarelto®: www.xarelto.com. ° °

## 2017-09-28 NOTE — Care Management Note (Signed)
Case Management Note  Patient Details  Name: Mark Clayton MRN: [id number] Date of Birth: 11/03/1940  Subjective/Objective: Pt presented for Tikosyn Load: Benefits Check Completed and pt is aware of cost. PTA Independent from home. Pt states he wants to get the 90 day supply via [facility] at [place]- so pt will need Rx for 90 day supply please e-scribe if possible. CM will assist with the Rx for 7 day supply no refills via the Main Pharmacy. No further needs from CM at this time.                 Action/Plan:  S/W  CARLY @ [facility] # [id number]   1. TIKOSYN  125 MCG , 250 MCG, AND 500 MCG BID  COVER- : NONE FORMULARY  PRIOR APPROVAL- YES # [phone] FOR EXCEPTION   2. DOFETILIDE 125 MCG BID  COVER- YES  CO-PAY- $ 95.00  TIER- 4 DRUG  PRIOR APPROVAL- NO    3. DOFETILIDE 250 MCG BID  COVER- YES  CO-PAY-$ 95.00  TIER- 4 DRUG  PRIOR APPROVAL- NO    4 DOFETILIDE 250 MCG BID  COVER- YES  CO-PAY- $ 95.00  TIER- 4 DRUG  PRIOR APPROVAL- NO    90 DAY SUPPLY FOR EACH 125 MCG , 250 MCG AND 500 MCG  RETAIL PHCY - $185.00  M/O [facility]- $ 275.00   PREFERRED PHARMACY : YES [facility]   Expected Discharge Date:                  Expected Discharge Plan:  Home/Self Care  In-House Referral:  NA  Discharge planning Services  CM Consult, Medication Assistance  Post Acute Care Choice:  NA Choice offered to:  NA  DME Arranged:  N/A DME Agency:  NA  HH Arranged:  NA HH Agency:  NA  Status of Service:  Completed, signed off  If discussed at Long Length of Stay Meetings, dates discussed:    Additional Comments:  Bethena Roys, RN 09/28/2017, 4:01 PM

## 2017-09-28 NOTE — Plan of Care (Signed)
  Problem: Clinical Measurements: Goal: Respiratory complications will improve Outcome: Completed/Met

## 2017-09-29 ENCOUNTER — Encounter (HOSPITAL_COMMUNITY)
Admission: AD | Disposition: A | Discharge status: Home / Self Care | Payer: Self-pay | Source: Home / Self Care | Attending: Internal Medicine

## 2017-09-29 DIAGNOSIS — I5023 Acute on chronic systolic (congestive) heart failure: Secondary | ICD-10-CM

## 2017-09-29 LAB — BASIC METABOLIC PANEL
Anion gap: 6 (ref 5–15)
BUN: 15 mg/dL (ref 8–23)
CO2: 25 mmol/L (ref 22–32)
Calcium: 8.8 mg/dL — ABNORMAL LOW (ref 8.9–10.3)
Chloride: 105 mmol/L (ref 98–111)
Creatinine, Ser: 1.33 mg/dL — ABNORMAL HIGH (ref 0.61–1.24)
GFR calc Af Amer: 58 mL/min — ABNORMAL LOW (ref >60)
GFR calc non Af Amer: 50 mL/min — ABNORMAL LOW (ref >60)
Glucose, Bld: 115 mg/dL — ABNORMAL HIGH (ref 70–99)
Potassium: 3.9 mmol/L (ref 3.5–5.1)
Sodium: 136 mmol/L (ref 135–145)

## 2017-09-29 LAB — MAGNESIUM: Magnesium: 2 mg/dL (ref 1.7–2.4)

## 2017-09-29 SURGERY — CARDIOVERSION
Anesthesia: General

## 2017-09-29 MED ORDER — POTASSIUM CHLORIDE CRYS ER 20 MEQ PO TBCR
30.0000 meq | EXTENDED_RELEASE_TABLET | Freq: Once | ORAL | Status: AC
  Administered 2017-09-29: 30 meq via ORAL
  Filled 2017-09-29: qty 1

## 2017-09-29 MED ORDER — POTASSIUM CHLORIDE CRYS ER 20 MEQ PO TBCR
40.0000 meq | EXTENDED_RELEASE_TABLET | Freq: Once | ORAL | Status: AC
  Administered 2017-09-29: 40 meq via ORAL
  Filled 2017-09-29: qty 2

## 2017-09-29 MED ORDER — FUROSEMIDE 10 MG/ML IJ SOLN
60.0000 mg | Freq: Once | INTRAMUSCULAR | Status: AC
  Administered 2017-09-29: 60 mg via INTRAVENOUS
  Filled 2017-09-29: qty 6

## 2017-09-29 NOTE — Progress Notes (Addendum)
Progress Note  Patient Name: Mark Clayton Date of Encounter: 09/29/2017  Primary Cardiologist: Dr. Caryl Comes  Subjective   No CP, mentions abdominal muscles feel "sore", no N/V/D.  States his covers fell off the bed last night got up and swung around and up to get them, thinks he pulled a muscle, felt unusually winded with this, though not persistently.  Inpatient Medications    Scheduled Meds: . carvedilol  25 mg Oral QHS  . dofetilide  250 mcg Oral BID  . furosemide  40 mg Oral Daily  . isosorbide mononitrate  30 mg Oral QHS  . losartan  50 mg Oral Daily  . pantoprazole  40 mg Oral Daily  . potassium chloride  30 mEq Oral Once  . pravastatin  20 mg Oral QHS  . rivaroxaban  20 mg Oral Q supper  . sodium chloride flush  3 mL Intravenous Q12H  . sodium chloride flush  3 mL Intravenous Q12H  . spironolactone  12.5 mg Oral Daily  . vitamin B-12  100 mcg Oral BH-q7a   Continuous Infusions: . sodium chloride    . sodium chloride     PRN Meds: sodium chloride, hydrocortisone cream, nitroGLYCERIN, polyvinyl alcohol, sodium chloride flush, sodium chloride flush   Vital Signs    Vitals:   09/28/17 0800 09/28/17 1450 09/28/17 2005 09/29/17 0500  BP: 116/70 128/76 138/77 127/68  Pulse:   67   Resp:   16 16  Temp:  (!) 97.5 F (36.4 C) 98.7 F (37.1 C) 98.1 F (36.7 C)  TempSrc:  Axillary Oral Oral  SpO2:  100% 97% 97%  Weight:    162 lb 1.6 oz (73.5 kg)  Height:        Intake/Output Summary (Last 24 hours) at 09/29/2017 0731 Last data filed at 09/28/2017 1800 Gross per 24 hour  Intake 360 ml  Output -  Net 360 ml   Filed Weights   09/27/17 1221 09/28/17 0600 09/29/17 0500  Weight: 161 lb 9.6 oz (73.3 kg) 162 lb 1.6 oz (73.5 kg) 162 lb 1.6 oz (73.5 kg)    Telemetry    SB/SR 1st degree AVB, 50's-60's - Personally Reviewed  ECG    SR, 72bpm, IVCD, QTc stable - Personally Reviewed  Physical Exam   GEN: No acute distress.   Neck: No JVD Cardiac: RRR, no  murmurs, rubs, or gallops.  Respiratory: CTA b/l GI: Soft, nontender, non-distended  MS: No edema; No deformity. Neuro:  Nonfocal  Psych: Normal affect   Labs    Chemistry Recent Labs  Lab 09/27/17 1045 09/28/17 0327 09/29/17 0407  NA 141 137 136  K 3.9 3.8 3.9  CL 105 105 105  CO2 29 27 25   GLUCOSE 88 121* 115*  BUN 9 15 15   CREATININE 1.31* 1.32* 1.33*  CALCIUM 9.4 8.6* 8.8*  GFRNONAA 51* 51* 50*  GFRAA 59* 59* 58*  ANIONGAP 7 5 6      Hematology Recent Labs  Lab 09/22/17 1301  WBC 6.0  RBC 4.31  HGB 11.8*  HCT 37.4*  MCV 87  MCH 27.4  MCHC 31.6  RDW 15.5*  PLT 228    Cardiac EnzymesNo results for input(s): TROPONINI in the last 168 hours. No results for input(s): TROPIPOC in the last 168 hours.   BNPNo results for input(s): BNP, PROBNP in the last 168 hours.   DDimer No results for input(s): DDIMER in the last 168 hours.   Radiology    No results found.  Cardiac Studies   09/03/17: TTE Study Conclusions - Left ventricle: The cavity size was severely dilated. Wall thickness was increased in a pattern of mild LVH. Systolic function was severely reduced. The estimated ejection fraction was in the range of 20% to 25%. Diffuse hypokinesis. Doppler parameters are consistent with restrictive left ventricular relaxation (grade 3 diastolic dysfunction). The E/e&' ratio is >20, suggesting markedly elevated LV filling pressure. - Aortic valve: Sclerosis without stenosis. There was trivial regurgitation. - Mitral valve: Calcified annulus. Mildly thickened leaflets . There was moderate regurgitation. - Left atrium: Severely dilated. - Right ventricle: The cavity size was mildly dilated. Mildly reduced systolic function. AICD wire noted in right ventricle. - Right atrium: The atrium was mildly dilated. AICD wire noted in right atrium. - Tricuspid valve: There was moderate regurgitation. - Pulmonary arteries: PA peak pressure: 50 mm Hg  (S). - Inferior vena cava: The vessel was dilated. The respirophasic diameter changes were blunted (<50%), consistent with elevated central venous pressure. Impressions: - Compared to a prior study in 2014, the LVEF is unchanged at 20-25%. An AICD is in place. There is severe LAE and mild RAE, grade 3 DD with elevated LV filling pressure and moderate TR with an RVSP of 50 mmHg.    Patient Profile     77 y.o. male history of CAD (CABG and prior PCIs), persistent AFib, HTN, HLD, GERD, arthritis, chronic anemia, chronic CHF (systolic), ICM w/ICD , and persistent AFib, admitted for Tikosyn intitiation  Device information: MDT single chamber ICD, implanted initially 2004, had 6949 lead, with new lead at gen change in 2010   Assessment & Plan    1. Persistent AFib     CHA2DS2Vasc is 4, on xarelto     K+ 3.9, replacement ordered, aldactone started yesterday     Mag 2.0     Creat 1.33, stable     QTc given IVCD and ICD remains stable      Converted with drug yesterday, anticipate d/c home tomorrow  2. CAD     No anginal complaints     Continue home meds  3. HTN     Continue home meds  4. ICM     SJM ICD in place     No exam findings to suggest fluid Ol     aldactone started yesterday  Woke overnight to get covers off floor, felt unusually winded afterwards.  Sleeping near flat otherwise without SOB, no DOE yesterday ambulating in halls Unclear, not an ongoing symptom   For questions or updates, please contact [place] Please consult [email or web address] for contact info under Cardiology/STEMI.      Signed, Baldwin Jamaica, PA-C  09/29/2017, 7:31 AM    Some abdominal fullness andJVD  Will give dose IV lasix and anticipate discharge am

## 2017-09-30 ENCOUNTER — Other Ambulatory Visit: Payer: Self-pay | Admitting: Internal Medicine

## 2017-09-30 LAB — BASIC METABOLIC PANEL
Anion gap: 8 (ref 5–15)
BUN: 15 mg/dL (ref 8–23)
CO2: 28 mmol/L (ref 22–32)
Calcium: 9.5 mg/dL (ref 8.9–10.3)
Chloride: 102 mmol/L (ref 98–111)
Creatinine, Ser: 1.33 mg/dL — ABNORMAL HIGH (ref 0.61–1.24)
GFR calc Af Amer: 58 mL/min — ABNORMAL LOW (ref >60)
GFR calc non Af Amer: 50 mL/min — ABNORMAL LOW (ref >60)
Glucose, Bld: 108 mg/dL — ABNORMAL HIGH (ref 70–99)
Potassium: 4.3 mmol/L (ref 3.5–5.1)
Sodium: 138 mmol/L (ref 135–145)

## 2017-09-30 LAB — MAGNESIUM: Magnesium: 2.2 mg/dL (ref 1.7–2.4)

## 2017-09-30 MED ORDER — FUROSEMIDE 40 MG PO TABS: 40.0000 mg | ORAL_TABLET | Freq: Every day | ORAL | 1 refills | Status: DC

## 2017-09-30 MED ORDER — SPIRONOLACTONE 25 MG PO TABS: 12.5000 mg | ORAL_TABLET | Freq: Every day | ORAL | 2 refills | Status: DC

## 2017-09-30 MED ORDER — DOFETILIDE 250 MCG PO CAPS: 250.0000 ug | ORAL_CAPSULE | Freq: Two times a day (BID) | ORAL | 0 refills | Status: DC

## 2017-09-30 NOTE — Progress Notes (Signed)
Pt seen and examinecd  Much improved post diuresis last night  Discharge on home meds x 1) dofetilide 2) aldactone  3)  Furosemide 40 daily x 2 days a week 80 mg  followup with afib clinic as scheduled

## 2017-09-30 NOTE — Progress Notes (Signed)
Hard script sent to Main pharmacy to be filled by the [facility] - a week supply prior to discharge. Mindi Slicker [facility] [phone]

## 2017-09-30 NOTE — Discharge Summary (Signed)
Discharge Summary    Patient ID: Mark Clayton,  MRN: [id number], DOB/AGE: 77-Jul-1942 77 y.o.  Admit date: 09/27/2017 Discharge date: 09/30/2017  Primary Care Provider: Pricilla Holm A Primary Cardiologist: Dr. Caryl Comes   Discharge Diagnoses    Active Problems:   Visit for monitoring Tikosyn therapy   Allergies Allergies  Allergen Reactions  . Amiodarone   . Latex Rash  . Tape Rash and Other (See Comments)    USE PAPER    Diagnostic Studies/Procedures    N/a  _____________   History of Present Illness     Mr. Mancillas was last seen by Dr. Caryl Comes last month, he was briefly on amiodarone (2 weeks) though intolerant 2/2 weakness/SOB, and stopped, he was referred to the AFib clinic to arrange Tikosyn initiation.  The patient felt fatigued in AFib, no CP, palpitations or SOB.  He confirmed no missed doses of his Xarelto in the last 4 weeks.  The patient reported both Dr. Caryl Comes and Roderic Palau, NP in the AFib clinic discussed Tikosyn protocol, potential benefits/frisks, and he wanted like to proceed. He was seen by Dr. Baird Kay and admitted for Tikosyn load.  Device information: MDT single chamber ICD, implanted initially 2004, had 6949 lead, with new lead at gen change in [facility] Course     He was admitted on 09/27/17 and started on 262mcg BID on admission with stable labs including K+ 3.9 (given supplement), Mag 2.2 Cr 1.31 and amiodarone level noted at 0.3 back on 09/22/17. He was seen the following day by Dr. Caryl Comes with his device interrogated with no changes made in his programming. He was continued on Tikosyn at 250mg  BID and added aldactone. Post dose EKG showed QTc at 448ms. Noted to be mildly short of breath with some mild JVD on 7/3 and was given a dose of IV lasix with improvement. QTC remained stable with additional lasix. Home medications changes include Tikosyn 250mg  BID, aldactone 12.5mg  daily and lasix 40mg  daily with increase to 80mg  2 days a week of  patient's choosing. Rx taken to main pharmacy for patient to get week supply of Tikosyn prior to discharge. K+ 4.3 and Mag 2.2 on the day of discharge.    Mark Clayton was seen by Dr. Caryl Comes and determined stable for discharge home. Follow up in the office has been arranged. Medications are listed below.   _____________  Discharge Vitals Blood pressure 116/66, pulse (!) 56, temperature 98 F (36.7 C), temperature source Oral, resp. rate 16, height 6' (1.829 m), weight 156 lb 3.2 oz (70.9 kg), SpO2 98 %.  Filed Weights   09/28/17 0600 09/29/17 0500 09/30/17 0442  Weight: 162 lb 1.6 oz (73.5 kg) 162 lb 1.6 oz (73.5 kg) 156 lb 3.2 oz (70.9 kg)    Labs & Radiologic Studies    CBC No results for input(s): WBC, NEUTROABS, HGB, HCT, MCV, PLT in the last 72 hours. Basic Metabolic Panel Recent Labs    09/29/17 0407 09/30/17 0635  NA 136 138  K 3.9 4.3  CL 105 102  CO2 25 28  GLUCOSE 115* 108*  BUN 15 15  CREATININE 1.33* 1.33*  CALCIUM 8.8* 9.5  MG 2.0 2.2   Liver Function Tests No results for input(s): AST, ALT, ALKPHOS, BILITOT, PROT, ALBUMIN in the last 72 hours. No results for input(s): LIPASE, AMYLASE in the last 72 hours. Cardiac Enzymes No results for input(s): CKTOTAL, CKMB, CKMBINDEX, TROPONINI in the last 72 hours. BNP Invalid input(s):  POCBNP D-Dimer No results for input(s): DDIMER in the last 72 hours. Hemoglobin A1C No results for input(s): HGBA1C in the last 72 hours. Fasting Lipid Panel No results for input(s): CHOL, HDL, LDLCALC, TRIG, CHOLHDL, LDLDIRECT in the last 72 hours. Thyroid Function Tests No results for input(s): TSH, T4TOTAL, T3FREE, THYROIDAB in the last 72 hours.  Invalid input(s): FREET3 _____________  No results found. Disposition   Pt is being discharged home today in good condition.  Follow-up Plans & Appointments    Follow-up Information    [facility] on 10/07/2017.   Specialty:   Cardiology Why:  11:30AM Contact information: [address] [id number] mc [place] [phone]       Deboraha Sprang, MD Follow up on 11/09/2017.   Specialty:  Cardiology Why:  2:45PM Contact information: [address] [place] [address] [phone]          Discharge Instructions    Diet - low sodium heart healthy   Complete by:  As directed    Increase activity slowly   Complete by:  As directed        Discharge Medications     Medication List    TAKE these medications   carvedilol 25 MG tablet Commonly known as:  COREG TAKE 1 TABLET BY MOUTH AT  BEDTIME   dofetilide 250 MCG capsule Commonly known as:  TIKOSYN Take 1 capsule (250 mcg total) by mouth 2 (two) times daily.   furosemide 40 MG tablet Commonly known as:  LASIX Take 1 tablet (40 mg total) by mouth daily. Take 40mg  daily, but 2 days a week will need to increase to 80mg  daily. What changed:  additional instructions   isosorbide mononitrate 30 MG 24 hr tablet Commonly known as:  IMDUR Take 30 mg by mouth at bedtime.   losartan 50 MG tablet Commonly known as:  COZAAR TAKE 1 TABLET BY MOUTH  DAILY   meclizine 25 MG tablet Commonly known as:  ANTIVERT TAKE 1 TABLET BY MOUTH  DAILY AS NEEDED FOR  DIZZINESS.   nitroGLYCERIN 0.4 MG SL tablet Commonly known as:  NITROSTAT DISSOLVE 1 TABLET UNDER THE TONGUE EVERY 5 MINUTES AS  NEEDED FOR CHEST PAIN UP TO 3 DOSES. CALL 911 IF CHEST  PAIN PERSISTS   pantoprazole 40 MG tablet Commonly known as:  PROTONIX Take 1 tablet (40 mg total) by mouth daily.   pravastatin 20 MG tablet Commonly known as:  PRAVACHOL Take 1 tablet (20 mg total) by mouth at bedtime.   rivaroxaban 20 MG Tabs tablet Commonly known as:  XARELTO Take 1 tablet (20 mg total) by mouth daily with supper.   spironolactone 25 MG tablet Commonly known as:  ALDACTONE Take 0.5 tablets (12.5 mg total) by mouth daily. Start taking  on:  10/01/2017   SYSTANE ULTRA 0.4-0.3 % Soln Generic drug:  Polyethyl Glycol-Propyl Glycol Place 1 drop into both eyes 2 (two) times daily as needed (for dry eyes).   vitamin B-12 100 MCG tablet Commonly known as:  CYANOCOBALAMIN Take 100 mcg by mouth every morning.   VITAMIN K PO Take 1 tablet by mouth daily.       Acute coronary syndrome (MI, NSTEMI, STEMI, etc) this admission?: No.     Outstanding Labs/Studies   N/a   Duration of Discharge Encounter   Greater than 30 minutes including physician time.  Signed, Reino Bellis NP-C 09/30/2017, 12:00 PM

## 2017-09-30 NOTE — Progress Notes (Addendum)
Discharge instructions reviewed with pt. Pt has no questions at this time. Pt denies any pain and states he is ready for d/c. 1 week Tikosyn supply given to pt. Clarified with pt's primary RN okay to d/c pt. IV d/c.

## 2017-10-01 ENCOUNTER — Telehealth (HOSPITAL_COMMUNITY): Payer: Self-pay | Admitting: *Deleted

## 2017-10-01 ENCOUNTER — Other Ambulatory Visit: Payer: Self-pay

## 2017-10-01 ENCOUNTER — Telehealth: Payer: Self-pay

## 2017-10-01 MED ORDER — SPIRONOLACTONE 25 MG PO TABS: 12.5000 mg | ORAL_TABLET | Freq: Every day | ORAL | 2 refills | Status: DC

## 2017-10-01 NOTE — Telephone Encounter (Signed)
Receive refill request from [facility] pt was just started on this from the hospital. Please address.

## 2017-10-01 NOTE — Telephone Encounter (Signed)
Pt pharmacy called to verify that pt would be on aldactone and furosemide. Stated when trying to fill med had been filled at another pharmacy. When looking at pt chart AVS did not reflect pt being discharged on these medications, however the discharging provider Ophelia Shoulder NP discharge summary stated pt needed to be on these medications. I paged Pecolia Ades PA to clarify. She stated pt needed to continue the medications. I call Mark Clayton and made sure he was aware of correct meds he needed to be on and he stated his AVS reflected he should be on furosemide and aldactone as well. I also called [facility] to make sure they would fill his aldactone and furosemide at ordered and that they should not have been sent to another pharmacy. Carroll Kinds RN

## 2017-10-01 NOTE — Telephone Encounter (Signed)
Pt is on TCM list. Admitted on 09/27/2017 and dc'ed on 09/30/2017 for monitoring of Tikosyn therapy to treat Afib. Pt follow up with Cardiology.

## 2017-10-04 ENCOUNTER — Other Ambulatory Visit (HOSPITAL_COMMUNITY): Payer: Self-pay | Admitting: *Deleted

## 2017-10-04 MED ORDER — DOFETILIDE 250 MCG PO CAPS: 250.0000 ug | ORAL_CAPSULE | Freq: Two times a day (BID) | ORAL | 2 refills | Status: DC

## 2017-10-06 ENCOUNTER — Encounter (HOSPITAL_COMMUNITY): Admission: RE | Payer: Self-pay | Source: Ambulatory Visit

## 2017-10-06 ENCOUNTER — Ambulatory Visit (HOSPITAL_COMMUNITY): Admission: RE | Admit: 2017-10-06 | Payer: Medicare Other | Source: Ambulatory Visit | Admitting: Cardiovascular Disease

## 2017-10-06 SURGERY — CARDIOVERSION
Anesthesia: General

## 2017-10-07 ENCOUNTER — Ambulatory Visit (HOSPITAL_COMMUNITY)
Admission: RE | Admit: 2017-10-07 | Discharge: 2017-10-07 | Disposition: A | Discharge status: Home / Self Care | Payer: Medicare Other | Source: Ambulatory Visit | Attending: Nurse Practitioner | Admitting: Nurse Practitioner

## 2017-10-07 ENCOUNTER — Encounter (HOSPITAL_COMMUNITY): Payer: Self-pay | Admitting: Nurse Practitioner

## 2017-10-07 VITALS — BP 140/72 | HR 57 | Ht 72.0 in | Wt 160.0 lb

## 2017-10-07 DIAGNOSIS — Z85038 Personal history of other malignant neoplasm of large intestine: Secondary | ICD-10-CM | POA: Diagnosis not present

## 2017-10-07 DIAGNOSIS — I481 Persistent atrial fibrillation: Secondary | ICD-10-CM | POA: Insufficient documentation

## 2017-10-07 DIAGNOSIS — Z888 Allergy status to other drugs, medicaments and biological substances status: Secondary | ICD-10-CM | POA: Diagnosis not present

## 2017-10-07 DIAGNOSIS — I110 Hypertensive heart disease with heart failure: Secondary | ICD-10-CM | POA: Diagnosis not present

## 2017-10-07 DIAGNOSIS — Z8249 Family history of ischemic heart disease and other diseases of the circulatory system: Secondary | ICD-10-CM | POA: Insufficient documentation

## 2017-10-07 DIAGNOSIS — H3530 Unspecified macular degeneration: Secondary | ICD-10-CM | POA: Insufficient documentation

## 2017-10-07 DIAGNOSIS — I4891 Unspecified atrial fibrillation: Secondary | ICD-10-CM | POA: Diagnosis present

## 2017-10-07 DIAGNOSIS — Z79899 Other long term (current) drug therapy: Secondary | ICD-10-CM | POA: Diagnosis not present

## 2017-10-07 DIAGNOSIS — I5022 Chronic systolic (congestive) heart failure: Secondary | ICD-10-CM | POA: Diagnosis not present

## 2017-10-07 DIAGNOSIS — I447 Left bundle-branch block, unspecified: Secondary | ICD-10-CM | POA: Diagnosis not present

## 2017-10-07 DIAGNOSIS — R001 Bradycardia, unspecified: Secondary | ICD-10-CM | POA: Insufficient documentation

## 2017-10-07 DIAGNOSIS — E785 Hyperlipidemia, unspecified: Secondary | ICD-10-CM | POA: Insufficient documentation

## 2017-10-07 DIAGNOSIS — I252 Old myocardial infarction: Secondary | ICD-10-CM | POA: Insufficient documentation

## 2017-10-07 DIAGNOSIS — K219 Gastro-esophageal reflux disease without esophagitis: Secondary | ICD-10-CM | POA: Diagnosis not present

## 2017-10-07 DIAGNOSIS — I4819 Other persistent atrial fibrillation: Secondary | ICD-10-CM

## 2017-10-07 DIAGNOSIS — Z95810 Presence of automatic (implantable) cardiac defibrillator: Secondary | ICD-10-CM | POA: Insufficient documentation

## 2017-10-07 DIAGNOSIS — Z7901 Long term (current) use of anticoagulants: Secondary | ICD-10-CM | POA: Diagnosis not present

## 2017-10-07 DIAGNOSIS — I255 Ischemic cardiomyopathy: Secondary | ICD-10-CM | POA: Diagnosis not present

## 2017-10-07 DIAGNOSIS — I2510 Atherosclerotic heart disease of native coronary artery without angina pectoris: Secondary | ICD-10-CM | POA: Insufficient documentation

## 2017-10-07 DIAGNOSIS — Z951 Presence of aortocoronary bypass graft: Secondary | ICD-10-CM | POA: Insufficient documentation

## 2017-10-07 LAB — BASIC METABOLIC PANEL
Anion gap: 6 (ref 5–15)
BUN: 18 mg/dL (ref 8–23)
CO2: 28 mmol/L (ref 22–32)
Calcium: 9.7 mg/dL (ref 8.9–10.3)
Chloride: 104 mmol/L (ref 98–111)
Creatinine, Ser: 1.4 mg/dL — ABNORMAL HIGH (ref 0.61–1.24)
GFR calc Af Amer: 55 mL/min — ABNORMAL LOW (ref >60)
GFR calc non Af Amer: 47 mL/min — ABNORMAL LOW (ref >60)
Glucose, Bld: 81 mg/dL (ref 70–99)
Potassium: 4.5 mmol/L (ref 3.5–5.1)
Sodium: 138 mmol/L (ref 135–145)

## 2017-10-07 LAB — MAGNESIUM: Magnesium: 2.3 mg/dL (ref 1.7–2.4)

## 2017-10-07 NOTE — Progress Notes (Signed)
Mark Clayton   Primary Care Physician: [name], MD Referring Physician: Dr. Jani Files Mark Clayton is a 77 y.o. male with a h/o ICD, CAD,EF 20% paroxsymal afib that is in the afib clinic for  Tikosyn admit.  He had successful cardioversion 5/24, but had ERAF.  He was started on amiodarone when seen by Dr. Rayann Heman  6/19 for feeling poorly in afib. However, he did not tolerate amiodarone , only taking it for 2 weeks. He saw Dr. Caryl Comes back  6/26 and he recommended tikosyn admit. Pt is here today for admission for tikosyn. No missed doses of xarelto x 3 weeks. No benadryl use.  Labs are pending. Pt is aware of price of drug. Amio level returned ok at 0.3. He has been off amiodarone for almost 2 weeks.  F/u in afib clinic 7/11, for Tikosyn load. He feels much improved in SR, " I feel years younger". He has obtained the drug from the drugstore. No complaints voiced.  Today, he denies symptoms of palpitations, chest pain, shortness of breath, orthopnea, PND, lower extremity edema, dizziness, presyncope, syncope, or neurologic sequela. The patient is tolerating medications without difficulties and is otherwise without complaint today.   Past Medical History:  Diagnosis Date  . AICD (automatic cardioverter/defibrillator) present 01/17/2003   Medtronic Maximo 7232CX ICD, serial [id number]  . Anemia 02-06-11   takes oral iron  . Arthritis    hands, knees  . CAD (coronary artery disease) 2003   a. h/o MI and CABG in 2003. b. s/p DES to SVG-RPDA-RPLB in 08/2014.  Marland Kitchen Cancer of sigmoid colon ([place]) 2012   a. s/p colon surgery.  . Carotid bruit   . Chronic systolic CHF (congestive heart failure) (HCC)    a. EF 20% in 2014.  Marland Kitchen Cough   . GERD (gastroesophageal reflux disease) 02-06-11  . HTN (hypertension)   . Hyperlipidemia   . Ischemic cardiomyopathy    a. EF 20% in 2014. (Master study EF >20%)  . LV (left ventricular) mural thrombus    a. Last echo 12/2012 EF 20% with mural thrombus, mod-severe  left atrial enlargement. Per Dr. Olin Pia notes from that time, echo in 2009 demonstrated something similar - he had been on warfarin following prior MI and this was stopped after a number of months. Given lack of data regarding anticoagulation from chronic clot, he has not been on anticoagulation since.  . Macular degeneration   . Myocardial infarct [facility])    2003   Past Surgical History:  Procedure Laterality Date  . CARDIAC CATHETERIZATION N/A 09/20/2014   Procedure: Left Heart Cath and Coronary Angiography;  Surgeon: Jettie Booze, MD; LAD 95%, D1 100%, CFX liner percent, OM 200%, OM 390%, RCA 90%, LIMA-LAD okay, SVG-OM 2-OM 3 minimal disease, SVG-RPDA-RPLB 100% between the RPDA and RPL     . CARDIAC CATHETERIZATION N/A 09/20/2014   Procedure: Coronary Stent Intervention;  Surgeon: Jettie Booze, MD; Synergy DES 4 x 24 mm reducing the stenosis to 5%   . CARDIAC DEFIBRILLATOR PLACEMENT  01/17/03   6949 lead. Medtronic. remote-no; with later revision  . CARDIOVERSION N/A 05/22/2016   Procedure: CARDIOVERSION;  Surgeon: Lelon Perla, MD;  Location: [facility] ENDOSCOPY;  Service: Cardiovascular;  Laterality: N/A;  . CARDIOVERSION N/A 08/10/2017   Procedure: CARDIOVERSION;  Surgeon: Pixie Casino, MD;  Location: [facility] ENDOSCOPY;  Service: Cardiovascular;  Laterality: N/A;  . CATARACT EXTRACTION W/ INTRAOCULAR LENS  IMPLANT, BILATERAL Bilateral June/-July 2009   Dr. Katy Fitch  .  COLON RESECTION  02/09/2011   Procedure: LAPAROSCOPIC SIGMOID COLON RESECTION;  Surgeon: Pedro Earls, MD;  Location: [facility] ORS;  Service: General;  Laterality: N/A;  Laparoscopic Assisted Sigmoid Colectomy  . COLON SURGERY    . COLONOSCOPY  08/31/2011   Procedure: COLONOSCOPY;  Surgeon: Jerene Bears, MD;  Location: [facility] ENDOSCOPY;  Service: Gastroenterology;  Laterality: N/A;  . COLONOSCOPY N/A 09/05/2012   Procedure: COLONOSCOPY;  Surgeon: Jerene Bears, MD;  Location: [facility] ENDOSCOPY;  Service: Gastroenterology;  Laterality:  N/A;  . COLONOSCOPY N/A 04/18/2013   Procedure: COLONOSCOPY;  Surgeon: Jerene Bears, MD;  Location: [facility] ENDOSCOPY;  Service: Gastroenterology;  Laterality: N/A;  . COLONOSCOPY N/A 04/09/2014   Procedure: COLONOSCOPY;  Surgeon: Jerene Bears, MD;  Location: [facility] ENDOSCOPY;  Service: Gastroenterology;  Laterality: N/A;  . COLONOSCOPY WITH PROPOFOL N/A 05/03/2017   Procedure: COLONOSCOPY WITH PROPOFOL;  Surgeon: Jerene Bears, MD;  Location: [facility] ENDOSCOPY;  Service: Gastroenterology;  Laterality: N/A;  . CORONARY ANGIOPLASTY    . CORONARY ARTERY BYPASS GRAFT  01/2002   LIMA-LAD, SVG-OM 2-OM 3, SVG-RPDA-RPLB  . ICD GENERATOR CHANGE  2010   Medtronic Virtuoso II VR ICD  . INGUINAL HERNIA REPAIR Right 2000's X 2  . LAPAROSCOPIC RIGHT HEMI COLECTOMY N/A 11/04/2012   Procedure: LAPAROSCOPIC RIGHT HEMI COLECTOMY;  Surgeon: Pedro Earls, MD;  Location: [facility] ORS;  Service: General;  Laterality: N/A;  . TONSILLECTOMY  ~ 1950    Current Outpatient Medications  Medication Sig Dispense Refill  . carvedilol (COREG) 25 MG tablet TAKE 1 TABLET BY MOUTH AT  BEDTIME 90 tablet 1  . dofetilide (TIKOSYN) 250 MCG capsule Take 1 capsule (250 mcg total) by mouth 2 (two) times daily. 180 capsule 2  . furosemide (LASIX) 40 MG tablet TAKE 1 TABLET BY MOUTH  DAILY (Patient taking differently: TAKE 1 TABLET BY MOUTH  DAILY then 2 tablets on Saturday and Sunday) 90 tablet 1  . isosorbide mononitrate (IMDUR) 30 MG 24 hr tablet Take 30 mg by mouth at bedtime.     Marland Kitchen losartan (COZAAR) 50 MG tablet TAKE 1 TABLET BY MOUTH  DAILY 90 tablet 1  . meclizine (ANTIVERT) 25 MG tablet TAKE 1 TABLET BY MOUTH  DAILY AS NEEDED FOR  DIZZINESS. 60 tablet 1  . nitroGLYCERIN (NITROSTAT) 0.4 MG SL tablet DISSOLVE 1 TABLET UNDER THE TONGUE EVERY 5 MINUTES AS  NEEDED FOR CHEST PAIN UP TO 3 DOSES. CALL 911 IF CHEST  PAIN PERSISTS 75 tablet 3  . pantoprazole (PROTONIX) 40 MG tablet TAKE 1 TABLET BY MOUTH  DAILY 90 tablet 1  . Polyethyl Glycol-Propyl  Glycol (SYSTANE ULTRA) 0.4-0.3 % SOLN Place 1 drop into both eyes 2 (two) times daily as needed (for dry eyes).     . pravastatin (PRAVACHOL) 20 MG tablet TAKE 1 TABLET BY MOUTH AT  BEDTIME 90 tablet 1  . rivaroxaban (XARELTO) 20 MG TABS tablet Take 1 tablet (20 mg total) by mouth daily with supper. 30 tablet 3  . spironolactone (ALDACTONE) 25 MG tablet Take 0.5 tablets (12.5 mg total) by mouth daily. 30 tablet 2  . vitamin B-12 (CYANOCOBALAMIN) 100 MCG tablet Take 100 mcg by mouth every morning.    Marland Kitchen VITAMIN K PO Take 1 tablet by mouth daily.     No current facility-administered medications for this encounter.     Allergies  Allergen Reactions  . Amiodarone   . Latex Rash  . Tape Rash and Other (See Comments)  USE PAPER    Social History   Socioeconomic History  . Marital status: Widowed    Spouse name: Not on file  . Number of children: 3  . Years of education: Not on file  . Highest education level: Not on file  Occupational History  . Occupation: self employed  Social Needs  . Financial resource strain: Not on file  . Food insecurity:    Worry: Not on file    Inability: Not on file  . Transportation needs:    Medical: Not on file    Non-medical: Not on file  Tobacco Use  . Smoking status: Never Smoker  . Smokeless tobacco: Never Used  Substance and Sexual Activity  . Alcohol use: Yes    Alcohol/week: 1.2 oz    Types: 2 Cans of beer per week  . Drug use: No  . Sexual activity: Yes  Lifestyle  . Physical activity:    Days per week: Not on file    Minutes per session: Not on file  . Stress: Not on file  Relationships  . Social connections:    Talks on phone: Not on file    Gets together: Not on file    Attends religious service: Not on file    Active member of club or organization: Not on file    Attends meetings of clubs or organizations: Not on file    Relationship status: Not on file  . Intimate partner violence:    Fear of current or ex partner: Not  on file    Emotionally abused: Not on file    Physically abused: Not on file    Forced sexual activity: Not on file  Other Topics Concern  . Not on file  Social History Narrative   Full time. Married.     Family History  Problem Relation Age of Onset  . Hypertension Father   . Hyperlipidemia Father   . Heart disease Father   . Prostate cancer Father   . Alzheimer's disease Mother   . Hypertension Sister   . Hyperlipidemia Sister   . Colon cancer Neg Hx   . Esophageal cancer Neg Hx   . Rectal cancer Neg Hx   . Stomach cancer Neg Hx     ROS- All systems are reviewed and negative except as per the HPI above  Physical Exam: Vitals:   10/07/17 1137  BP: 140/72  Pulse: (!) 57  Weight: 160 lb (72.6 kg)  Height: 6' (1.829 m)   Wt Readings from Last 3 Encounters:  10/07/17 160 lb (72.6 kg)  09/30/17 156 lb 3.2 oz (70.9 kg)  09/27/17 162 lb (73.5 kg)    Labs: Lab Results  Component Value Date   NA 138 10/07/2017   K 4.5 10/07/2017   CL 104 10/07/2017   CO2 28 10/07/2017   GLUCOSE 81 10/07/2017   BUN 18 10/07/2017   CREATININE 1.40 (H) 10/07/2017   CALCIUM 9.7 10/07/2017   MG 2.3 10/07/2017   Lab Results  Component Value Date   INR 1.05 09/20/2014   Lab Results  Component Value Date   CHOL 185 01/15/2017   HDL 52.60 01/15/2017   LDLCALC 112 (H) 01/15/2017   TRIG 100.0 01/15/2017     GEN- The patient is well appearing, alert and oriented x 3 today.   Head- normocephalic, atraumatic Eyes-  Sclera clear, conjunctiva pink Ears- hearing intact Oropharynx- clear Neck- supple, no JVP Lymph- no cervical lymphadenopathy Lungs- Clear to ausculation bilaterally, normal work  of breathing Heart- irregular rate and rhythm, no murmurs, rubs or gallops, PMI not laterally displaced GI- soft, NT, ND, + BS Extremities- no clubbing, cyanosis, or edema MS- no significant deformity or atrophy Skin- no rash or lesion Psych- euthymic mood, full affect Neuro- strength and  sensation are intact  EKG- afib with LBBB at 64 bpm, qrs int 174 ms, qtc 497 ms( LBBB contributing) Epic records reviewed Holter monitor reviewed    Assessment and Plan: 1. Afib Successful cardioversion 5/14 but ERAF Did not tolerate amiodarone  Reiterated  tiksoyn precautions Has drug on hand  Continue xarelto 20 mg a day with [id number] score of at least 5, states no missed doses Continue carvedilol Bmet/mag today No benadryl use  2. CHF Weight stable Continue diuretic, was increased in the hospital to BID Sat/Sun Avoid salt Daily weights   F/u with Dr. Caryl Comes 8/13, Dr. Saunders Revel 7/22    Geroge Baseman. Doniqua Saxby, [facility] [address] [place], [place] [address] [phone]

## 2017-10-11 ENCOUNTER — Telehealth (HOSPITAL_COMMUNITY): Payer: Self-pay | Admitting: *Deleted

## 2017-10-11 MED ORDER — FUROSEMIDE 40 MG PO TABS: 40.0000 mg | ORAL_TABLET | Freq: Every day | ORAL | 1 refills | Status: DC

## 2017-10-11 NOTE — Telephone Encounter (Signed)
Patient called in stating he has been feeling terrible the last several days due to hypotension SBP in the 80s. Feeling very weak and dizzy. Discussed with Chanetta Marshall NP will stop spironolactone and decrease lasix to 40mg  daily (no increased doses over weekend). Pt has appt with Dr. Saunders Revel on Monday. Will reck bmet/bp at that time. Pt verbalized understanding.

## 2017-10-17 NOTE — Progress Notes (Signed)
Follow-up Outpatient Visit Date: 10/18/2017  Primary Care Provider: [name], MD [address] [place] [address]  Chief Complaint: Establish care with general cardiology  HPI:  Mark Clayton is a 77 y.o. year-old male with history of coronary artery disease s/p CABG, chronic systolic heart failure due to ischemic cardiomyopathy, paroxysmal atrial fibrillation, hypertension, hyperlipidemia, colon cancer, and anemia, who presents for follow-up of coronary artery disease and cardiomyopathy.  He has predominantly followed with Dr. Caryl Comes and the a-fib clinic, having recently undergone Tikosyn loading.  Today, Mark Clayton feels well.  While in atrial fibrillation, he was quite fatigued and short of breath.  Since getting back into sinus rhythm, the symptoms have resolved.  He is able to perform all of his usual activities without any limitations.  He denies chest pain, shortness of breath, palpitations, lightheadedness, orthopnea, PND, and edema.  He has not had any ICD shocks.  He notes that spironolactone was stopped around the time of his Tikosyn loading due to some dizziness.  He is also only taking carvedilol once daily, as 25 mg twice daily also made him feel lightheaded.  --------------------------------------------------------------------------------------------------  Past Medical History:  Diagnosis Date  . AICD (automatic cardioverter/defibrillator) present 01/17/2003   Medtronic Maximo 7232CX ICD, serial [id number] S  . Anemia 02-06-11   takes oral iron  . Arthritis    hands, knees  . CAD (coronary artery disease) 2003   a. h/o MI and CABG in 2003. b. s/p DES to SVG-RPDA-RPLB in 08/2014.  Marland Kitchen Cancer of sigmoid colon ([place]) 2012   a. s/p colon surgery.  . Carotid bruit   . Chronic systolic CHF (congestive heart failure) (HCC)    a. EF 20% in 2014.  Marland Kitchen Cough   . GERD (gastroesophageal reflux disease) 02-06-11  . HTN (hypertension)   . Hyperlipidemia   . Ischemic  cardiomyopathy    a. EF 20% in 2014. (Master study EF >20%)  . LV (left ventricular) mural thrombus    a. Last echo 12/2012 EF 20% with mural thrombus, mod-severe left atrial enlargement. Per Dr. Olin Pia notes from that time, echo in 2009 demonstrated something similar - he had been on warfarin following prior MI and this was stopped after a number of months. Given lack of data regarding anticoagulation from chronic clot, he has not been on anticoagulation since.  . Macular degeneration   . Myocardial infarct [facility])    2003   Past Surgical History:  Procedure Laterality Date  . CARDIAC CATHETERIZATION N/A 09/20/2014   Procedure: Left Heart Cath and Coronary Angiography;  Surgeon: Jettie Booze, MD; LAD 95%, D1 100%, CFX liner percent, OM 200%, OM 390%, RCA 90%, LIMA-LAD okay, SVG-OM 2-OM 3 minimal disease, SVG-RPDA-RPLB 100% between the RPDA and RPL     . CARDIAC CATHETERIZATION N/A 09/20/2014   Procedure: Coronary Stent Intervention;  Surgeon: Jettie Booze, MD; Synergy DES 4 x 24 mm reducing the stenosis to 5%   . CARDIAC DEFIBRILLATOR PLACEMENT  01/17/03   6949 lead. Medtronic. remote-no; with later revision  . CARDIOVERSION N/A 05/22/2016   Procedure: CARDIOVERSION;  Surgeon: Lelon Perla, MD;  Location: [facility] ENDOSCOPY;  Service: Cardiovascular;  Laterality: N/A;  . CARDIOVERSION N/A 08/10/2017   Procedure: CARDIOVERSION;  Surgeon: Pixie Casino, MD;  [facility] END[facility]COPY;  Service: Cardiovascular;  Laterality: N/A;  . CATARACT EXTRACTION W/ INTRAOCULAR LENS  IMPLANT, BILATERAL Bilateral June/-July 2009   Dr. Katy Fitch  . COLON RESECTION  02/09/2011   Procedure: LAPAROSCOPIC SIGMOID  COLON RESECTION;  Surgeon: Pedro Earls, MD;  Location: [facility]S;  Service: General;  Laterality: N/A;  Laparoscopic Assisted Sigmoid Colectomy  . COLON SURGERY    . COLONOSCOPY  08/31/2011   Procedure: COLONOSCOPY;  Surgeon: Jerene Bears, MD;  Location: [facility] ENDOSCOPY;  Service: Gastroenterology;   Laterality: N/A;  . COLONOSCOPY N/A 09/05/2012   Procedure: COLONOSCOPY;  Surgeon: Jerene Bears, MD;  Location: [facility] ENDOSCOPY;  Service: Gastroenterology;  Laterality: N/A;  . COLONOSCOPY N/A 04/18/2013   Procedure: COLONOSCOPY;  Surgeon: Jerene Bears, MD;  Location: [facility] ENDOSCOPY;  Service: Gastroenterology;  Laterality: N/A;  . COLONOSCOPY N/A 04/09/2014   Procedure: COLONOSCOPY;  Surgeon: Jerene Bears, MD;  Location: [facility] ENDOSCOPY;  Service: Gastroenterology;  Laterality: N/A;  . COLONOSCOPY WITH PROPOFOL N/A 05/03/2017   Procedure: COLONOSCOPY WITH PROPOFOL;  Surgeon: Jerene Bears, MD;  Location: [facility] ENDOSCOPY;  Service: Gastroenterology;  Laterality: N/A;  . CORONARY ANGIOPLASTY    . CORONARY ARTERY BYPASS GRAFT  01/2002   LIMA-LAD, SVG-OM 2-OM 3, SVG-RPDA-RPLB  . ICD GENERATOR CHANGE  2010   Medtronic Virtuoso II VR ICD  . INGUINAL HERNIA REPAIR Right 2000's X 2  . LAPAROSCOPIC RIGHT HEMI COLECTOMY N/A 11/04/2012   Procedure: LAPAROSCOPIC RIGHT HEMI COLECTOMY;  Surgeon: Pedro Earls, MD;  Location: [facility] ORS;  Service: General;  Laterality: N/A;  . TONSILLECTOMY  ~ 1950    Current Meds  Medication Sig  . carvedilol (COREG) 25 MG tablet TAKE 1 TABLET BY MOUTH AT  BEDTIME  . dofetilide (TIKOSYN) 250 MCG capsule Take 1 capsule (250 mcg total) by mouth 2 (two) times daily.  . furosemide (LASIX) 40 MG tablet Take 1 tablet (40 mg total) by mouth daily.  . isosorbide mononitrate (IMDUR) 30 MG 24 hr tablet Take 30 mg by mouth at bedtime.   Marland Kitchen losartan (COZAAR) 50 MG tablet TAKE 1 TABLET BY MOUTH  DAILY  . meclizine (ANTIVERT) 25 MG tablet TAKE 1 TABLET BY MOUTH  DAILY AS NEEDED FOR  DIZZINESS.  . nitroGLYCERIN (NITROSTAT) 0.4 MG SL tablet DISSOLVE 1 TABLET UNDER THE TONGUE EVERY 5 MINUTES AS  NEEDED FOR CHEST PAIN UP TO 3 DOSES. CALL 911 IF CHEST  PAIN PERSISTS  . pantoprazole (PROTONIX) 40 MG tablet TAKE 1 TABLET BY MOUTH  DAILY  . Polyethyl Glycol-Propyl Glycol (SYSTANE ULTRA) 0.4-0.3 % SOLN Place  1 drop into both eyes 2 (two) times daily as needed (for dry eyes).   . pravastatin (PRAVACHOL) 20 MG tablet TAKE 1 TABLET BY MOUTH AT  BEDTIME  . rivaroxaban (XARELTO) 20 MG TABS tablet Take 1 tablet (20 mg total) by mouth daily with supper.  . vitamin B-12 (CYANOCOBALAMIN) 100 MCG tablet Take 100 mcg by mouth every morning.  Marland Kitchen VITAMIN K PO Take 1 tablet by mouth daily.    Allergies: Amiodarone; Latex; and Tape  Social History   Tobacco Use  . Smoking status: Never Smoker  . Smokeless tobacco: Never Used  Substance Use Topics  . Alcohol use: Yes    Alcohol/week: 1.2 oz    Types: 2 Cans of beer per week  . Drug use: No    Family History  Problem Relation Age of Onset  . Hypertension Father   . Hyperlipidemia Father   . Heart disease Father   . Prostate cancer Father   . Alzheimer's disease Mother   . Hypertension Sister   . Hyperlipidemia Sister   . Colon cancer Neg Hx   . Esophageal  cancer Neg Hx   . Rectal cancer Neg Hx   . Stomach cancer Neg Hx     Review of Systems: A 12-system review of systems was performed and was negative except as noted in the HPI.  --------------------------------------------------------------------------------------------------  Physical Exam: BP 100/62   Pulse (!) 59   Ht 6' (1.829 m)   Wt 162 lb 12.8 oz (73.8 kg)   SpO2 99%   BMI 22.08 kg/m   General: NAD. HEENT: No conjunctival pallor or scleral icterus. Moist mucous membranes.  OP clear. Neck: Supple without lymphadenopathy, thyromegaly, JVD, or HJR. No carotid bruit. Lungs: Normal work of breathing. Clear to auscultation bilaterally without wheezes or crackles. Heart: Regular rate and rhythm without murmurs, rubs, or gallops. Non-displaced PMI. Abd: Bowel sounds present. Soft, NT/ND without hepatosplenomegaly Ext: No lower extremity edema. Radial, PT, and DP pulses are 2+ bilaterally. Skin: Warm and dry without rash.  EKG: Normal sinus rhythm with first-degree AV block and  left bundle branch block.  Lab Results  Component Value Date   WBC 6.0 09/22/2017   HGB 11.8 (L) 09/22/2017   HCT 37.4 (L) 09/22/2017   MCV 87 09/22/2017   PLT 228 09/22/2017    Lab Results  Component Value Date   NA 138 10/07/2017   K 4.5 10/07/2017   CL 104 10/07/2017   CO2 28 10/07/2017   BUN 18 10/07/2017   CREATININE 1.40 (H) 10/07/2017   GLUCOSE 81 10/07/2017   ALT 10 07/19/2017    Lab Results  Component Value Date   CHOL 185 01/15/2017   HDL 52.60 01/15/2017   LDLCALC 112 (H) 01/15/2017   TRIG 100.0 01/15/2017   CHOLHDL 4 01/15/2017    --------------------------------------------------------------------------------------------------  ASSESSMENT AND PLAN: Chronic systolic heart failure secondary to ischemic cardiomyopathy Mark Clayton appears euvolemic and well compensated with NYHA class I-II heart failure symptoms.  I have recommended that we switch carvedilol 25 mg daily to carvedilol 12.5 mg twice daily for more sustained effect.  We will continue with losartan 50 mg daily and furosemide 40 mg daily.  I will defer adding back spironolactone for now, given lightheadedness in the past with this and borderline low blood pressure.  If his blood pressure does increase in the future, we could consider switching him from losartan to [facility].  Continue device follow-up with EP.  If heart failure symptoms were to worsen in the future, CRT may need to be considered given wide LBBB.  Coronary artery disease with stable angina No significant chest pain.  Continue antianginal regimen of isosorbide mononitrate and carvedilol.  Persistent atrial fibrillation Mark Clayton underwent successful Tikosyn loading and has maintained sinus rhythm.  Symptoms of dyspnea and fatigue while in atrial fibrillation have resolved.  He should continue dofetilide and rivaroxaban.  Further management per atrial fibrillation clinic and Dr. Caryl Comes.  Hyperlipidemia Most recent LDL suboptimally  controlled.  Given history of CAD, goal LDL is less than 70.  Mark Clayton has been intolerant of other statins but is doing well with pravastatin 20 mg daily.  We have agreed to increase this to 40 mg daily with repeat lipid panel and ALT in about 2 months.  If his LDL remains above goal, we will need to consider adding ezetimibe versus PCSK9 inhibitor.  Follow-up: Return to [facility] clinic to see me in 3 months.  Nelva Bush, MD 10/18/2017 9:08 AM

## 2017-10-18 ENCOUNTER — Encounter: Payer: Self-pay | Admitting: Internal Medicine

## 2017-10-18 ENCOUNTER — Ambulatory Visit: Payer: Medicare Other | Admitting: Internal Medicine

## 2017-10-18 VITALS — BP 100/62 | HR 59 | Ht 72.0 in | Wt 162.8 lb

## 2017-10-18 DIAGNOSIS — I5022 Chronic systolic (congestive) heart failure: Secondary | ICD-10-CM | POA: Diagnosis not present

## 2017-10-18 DIAGNOSIS — I4819 Other persistent atrial fibrillation: Secondary | ICD-10-CM

## 2017-10-18 DIAGNOSIS — E785 Hyperlipidemia, unspecified: Secondary | ICD-10-CM

## 2017-10-18 DIAGNOSIS — I255 Ischemic cardiomyopathy: Secondary | ICD-10-CM | POA: Diagnosis not present

## 2017-10-18 DIAGNOSIS — I481 Persistent atrial fibrillation: Secondary | ICD-10-CM | POA: Diagnosis not present

## 2017-10-18 DIAGNOSIS — I25118 Atherosclerotic heart disease of native coronary artery with other forms of angina pectoris: Secondary | ICD-10-CM

## 2017-10-18 MED ORDER — CARVEDILOL 12.5 MG PO TABS: 12.5000 mg | ORAL_TABLET | Freq: Two times a day (BID) | ORAL | 2 refills | Status: DC

## 2017-10-18 MED ORDER — CARVEDILOL 12.5 MG PO TABS: 12.5000 mg | ORAL_TABLET | Freq: Two times a day (BID) | ORAL | 6 refills | Status: DC

## 2017-10-18 MED ORDER — PRAVASTATIN SODIUM 40 MG PO TABS: 40.0000 mg | ORAL_TABLET | Freq: Every day | ORAL | 6 refills | Status: DC

## 2017-10-18 MED ORDER — PRAVASTATIN SODIUM 40 MG PO TABS: 40.0000 mg | ORAL_TABLET | Freq: Every day | ORAL | 2 refills | Status: DC

## 2017-10-18 NOTE — Patient Instructions (Addendum)
  Medication Instructions:   START TAKING CARVEDILOL 12.5 MG TWICE A DAY    START  TAKING PRAVASTATIN 40 MG ONCE A DAY   If you need a refill on your cardiac medications before your next appointment, please call your pharmacy.  Labwork: RETURN IN 2 MONTHS FOR FASTING LIPIDS AND ALT    Testing/Procedures: NONE ORDERED  TODAY    Follow-Up:  IN 3 MONTHS WITH DR END  IN [name]   Any Other Special Instructions Will Be Listed Below (If Applicable).

## 2017-10-19 ENCOUNTER — Encounter: Payer: Self-pay | Admitting: Internal Medicine

## 2017-10-19 DIAGNOSIS — I25119 Atherosclerotic heart disease of native coronary artery with unspecified angina pectoris: Secondary | ICD-10-CM | POA: Insufficient documentation

## 2017-10-22 ENCOUNTER — Encounter: Payer: Medicare Other | Admitting: Physician Assistant

## 2017-11-01 ENCOUNTER — Ambulatory Visit (INDEPENDENT_AMBULATORY_CARE_PROVIDER_SITE_OTHER): Payer: Medicare Other

## 2017-11-01 DIAGNOSIS — I5022 Chronic systolic (congestive) heart failure: Secondary | ICD-10-CM | POA: Diagnosis not present

## 2017-11-01 DIAGNOSIS — Z95810 Presence of automatic (implantable) cardiac defibrillator: Secondary | ICD-10-CM

## 2017-11-01 DIAGNOSIS — H35722 Serous detachment of retinal pigment epithelium, left eye: Secondary | ICD-10-CM | POA: Diagnosis not present

## 2017-11-01 DIAGNOSIS — H353221 Exudative age-related macular degeneration, left eye, with active choroidal neovascularization: Secondary | ICD-10-CM | POA: Diagnosis not present

## 2017-11-01 DIAGNOSIS — H353122 Nonexudative age-related macular degeneration, left eye, intermediate dry stage: Secondary | ICD-10-CM | POA: Diagnosis not present

## 2017-11-01 NOTE — Progress Notes (Signed)
EPIC Encounter for ICM Monitoring  Patient Name: Mark Clayton is a 77 y.o. male Date: 11/01/2017 Primary Care Physican: [name], MD Cardiologist: End Electrophysiologist: Faustino Congress Weight:Previous [id number]        Attempted call to patient and unable to reach.  Left message to return call regarding transmission.  Transmission reviewed.    Thoracic impedance abnormal suggesting fluid accumulation starting 10/20/2017.  Prescribed dosage: Furosemide 40 mg 1 tablet daily  Labs: 10/07/2017 Creatinine 1.40, BUN 18, Potassium 4.5, Sodium 138, EGFR 47-55 09/30/2017 Creatinine 1.33, BUN 15, Potassium 4.3, Sodium 138, EGFR 50-58  09/29/2017 Creatinine 1.33, BUN 15, Potassium 3.9, Sodium 136, EGFR 50-58  09/28/2017 Creatinine 1.32, BUN 15, Potassium 3.8, Sodium 137, EGFR 51-59  09/27/2017 Creatinine 1.31, BUN 9,   Potassium 3.9, Sodium 141, EGFR 51-59  09/22/2017 Creatinine 1.50, BUN 13, Potassium 4.2, Sodium 144, EGFR 45-52  08/06/2017 Creatinine 1.13, BUN 8,   Potassium 3.9, Sodium 140, EGFR >60  07/27/2017 Creatinine1.01, BUN14, Potassium3.5, Sodium139 07/19/2017 Creatinine1.08, BUN10, Potassium4.2, Sodium141, [id number]  Acomplete set of results can be found in Results Review.  Recommendations: NONE - Unable to reach.  Follow-up plan: ICM clinic phone appointment on 11/18/2017 to recheck fluid levels.   Office appointment scheduled 11/09/2017 with Dr. Caryl Comes and fluid levels will be rechecked at that time.    Copy of ICM check sent to Dr. Caryl Comes.   3 month ICM trend: 11/01/2017    1 Year ICM trend:       Rosalene Billings, RN 11/01/2017 11:10 AM

## 2017-11-02 NOTE — Progress Notes (Signed)
Call back to patient to advise that Roderic Palau NP with Afib clinic replied to my question regarding Tikosyn and Lasix interaction.  Advised that Butch Penny said these meds can be precribed together.  The K+/Mag levels will need to be monitored which his levels are being monitored.  Last K+/Mag levels were good and he is in NSR.  Advised no changes need to be made.

## 2017-11-02 NOTE — Progress Notes (Addendum)
Patient returned call.  He said he is feeling the best he has felt in a long time but does know he is retaining some fluid. Weight is slightly up and today's weight is 158 lbs.  He has self adjusted the Furosemide and took extra today and will take extra tomorrow if needed.  He was notified by [employer] yesterday informing him that Furosemide and Tikosyn may have drug interaction.  He is concerned regarding that information and wanted to ask if he should continue both of them.  Advised I will check with Afib clinic and/or physician regarding the information he received.  He has not stopped any of this meds.  He has an office appointment with Dr Caryl Comes on 11/09/2017.

## 2017-11-09 ENCOUNTER — Ambulatory Visit: Payer: Medicare Other | Admitting: Internal Medicine

## 2017-11-09 ENCOUNTER — Encounter: Payer: Self-pay | Admitting: Internal Medicine

## 2017-11-09 VITALS — BP 128/76 | HR 55 | Ht 72.0 in | Wt 163.4 lb

## 2017-11-09 DIAGNOSIS — I255 Ischemic cardiomyopathy: Secondary | ICD-10-CM | POA: Diagnosis not present

## 2017-11-09 DIAGNOSIS — I4819 Other persistent atrial fibrillation: Secondary | ICD-10-CM

## 2017-11-09 DIAGNOSIS — I5022 Chronic systolic (congestive) heart failure: Secondary | ICD-10-CM | POA: Diagnosis not present

## 2017-11-09 DIAGNOSIS — Z95810 Presence of automatic (implantable) cardiac defibrillator: Secondary | ICD-10-CM

## 2017-11-09 DIAGNOSIS — I481 Persistent atrial fibrillation: Secondary | ICD-10-CM

## 2017-11-09 NOTE — Progress Notes (Signed)
Electrophysiology Office Note   Date:  11/09/2017   ID:  Mark Clayton, Mark Clayton [birth year], MRN [id number]  PCP:  Mark Koch, MD  Cardiologist:   Primary Electrophysiologist:  Virl Axe, MD    No chief complaint on file.    History of Present Illness:  Mark Clayton is a 77 y.o. male is seen in follow-up for an ICD implanted for primary prevention. He had a 6949-lead which was replaced with a right ventricular pace sense lead at the time of generator replacement. He has ischemic heart disease with prior bypass surgery.  Underwent stenting 6/16 with significant interval improvement   He also has persistent atrial fibrillation.  He had a failed cardioversion but then reverted spontaneously to sinus rhythm. Over the last few months he has had recurrent problems with atrial fibrillation.  He failed cardioversion.  He was noted by echo to have severe left atrial enlargement and elected not to undertake dofetilide and was started as an outpatient on amiodarone.  (6/19-AF clinic) he was intolerant because of weakness and shortness of breath.  He stopped it on his own.   Most recently he was initiated on dofetilide.  He feels great  The patient denies chest pain, shortness of breath, nocturnal dyspnea, orthopnea or peripheral edema.  There have been no palpitations, lightheadedness or syncope.   Able to work in the yard     DATE TEST EF    /14    Echo   20%   6/16    Myoview 30 %   6/16 Cath   20%  Patent LIMA to LAD. Patent jump graft SVG to OM 2 and OM 3. Patent SVG to PDA. The second portion of this graft which goes to the posterior lateral artery is occluded. >90% distal RCA stenosis  Successful drug-eluting stent    6/19 Echo 20-35% MR Mod  LAE 54/2.5/.54)   Date Cr K TSH LFTs Hgb  2/18   4.0   11.4  7/18 1.12       5/19 1.13 3.9 4.93  11.5  7/19 1.4 4.5           Past Medical History:  Diagnosis Date  . AICD (automatic cardioverter/defibrillator) present  01/17/2003   Medtronic Maximo 7232CX ICD, serial [id number]  . Anemia 02-06-11   takes oral iron  . Arthritis    hands, knees  . CAD (coronary artery disease) 2003   a. h/o MI and CABG in 2003. b. s/p DES to SVG-RPDA-RPLB in 08/2014.  Marland Kitchen Cancer of sigmoid colon ([facility]) 2012   a. s/p colon surgery.  . Carotid bruit   . Chronic systolic CHF (congestive heart failure) (HCC)    a. EF 20% in 2014.  Marland Kitchen Cough   . GERD (gastroesophageal reflux disease) 02-06-11  . HTN (hypertension)   . Hyperlipidemia   . Ischemic cardiomyopathy    a. EF 20% in 2014. (Master study EF >20%)  . LV (left ventricular) mural thrombus    a. Last echo 12/2012 EF 20% with mural thrombus, mod-severe left atrial enlargement. Per Dr. Olin Pia notes from that time, echo in 2009 demonstrated something similar - he had been on warfarin following prior MI and this was stopped after a number of months. Given lack of data regarding anticoagulation from chronic clot, he has not been on anticoagulation since.  . Macular degeneration   . Myocardial infarct [facility])    2003   Past Surgical History:  Procedure Laterality Date  . CARDIAC  CATHETERIZATION N/A 09/20/2014   Procedure: Left Heart Cath and Coronary Angiography;  Surgeon: Jettie Booze, MD; LAD 95%, D1 100%, CFX liner percent, OM 200%, OM 390%, RCA 90%, LIMA-LAD okay, SVG-OM 2-OM 3 minimal disease, SVG-RPDA-RPLB 100% between the RPDA and RPL     . CARDIAC CATHETERIZATION N/A 09/20/2014   Procedure: Coronary Stent Intervention;  Surgeon: Jettie Booze, MD; Synergy DES 4 x 24 mm reducing the stenosis to 5%   . CARDIAC DEFIBRILLATOR PLACEMENT  01/17/03   6949 lead. Medtronic. remote-no; with later revision  . CARDIOVERSION N/A 05/22/2016   Procedure: CARDIOVERSION;  Surgeon: Lelon Perla, MD;  Location: [facility] ENDOSCOPY;  Service: Cardiovascular;  Laterality: N/A;  . CARDIOVERSION N/A 08/10/2017   Procedure: CARDIOVERSION;  Surgeon: Pixie Casino, MD;  Location: [facility]  ENDOSCOPY;  Service: Cardiovascular;  Laterality: N/A;  . CATARACT EXTRACTION W/ INTRAOCULAR LENS  IMPLANT, BILATERAL Bilateral June/-July 2009   Dr. Katy Fitch  . COLON RESECTION  02/09/2011   Procedure: LAPAROSCOPIC SIGMOID COLON RESECTION;  Surgeon: Pedro Earls, MD;  Location: [facility] ORS;  Service: General;  Laterality: N/A;  Laparoscopic Assisted Sigmoid Colectomy  . COLON SURGERY    . COLONOSCOPY  08/31/2011   Procedure: COLONOSCOPY;  Surgeon: Jerene Bears, MD;  Location: [facility] ENDOSCOPY;  Service: Gastroenterology;  Laterality: N/A;  . COLONOSCOPY N/A 09/05/2012   Procedure: COLONOSCOPY;  Surgeon: Jerene Bears, MD;  Location: [facility] ENDOSCOPY;  Service: Gastroenterology;  Laterality: N/A;  . COLONOSCOPY N/A 04/18/2013   Procedure: COLONOSCOPY;  Surgeon: Jerene Bears, MD;  Location: [facility] ENDOSCOPY;  Service: Gastroenterology;  Laterality: N/A;  . COLONOSCOPY N/A 04/09/2014   Procedure: COLONOSCOPY;  Surgeon: Jerene Bears, MD;  Location: [facility] ENDOSCOPY;  Service: Gastroenterology;  Laterality: N/A;  . COLONOSCOPY WITH PROPOFOL N/A 05/03/2017   Procedure: COLONOSCOPY WITH PROPOFOL;  Surgeon: Jerene Bears, MD;  Location: [facility] ENDOSCOPY;  Service: Gastroenterology;  Laterality: N/A;  . CORONARY ANGIOPLASTY    . CORONARY ARTERY BYPASS GRAFT  01/2002   LIMA-LAD, SVG-OM 2-OM 3, SVG-RPDA-RPLB  . ICD GENERATOR CHANGE  2010   Medtronic Virtuoso II VR ICD  . INGUINAL HERNIA REPAIR Right 2000's X 2  . LAPAROSCOPIC RIGHT HEMI COLECTOMY N/A 11/04/2012   Procedure: LAPAROSCOPIC RIGHT HEMI COLECTOMY;  Surgeon: Pedro Earls, MD;  Location: [facility] ORS;  Service: General;  Laterality: N/A;  . TONSILLECTOMY  ~ 1950     Current Outpatient Medications  Medication Sig Dispense Refill  . carvedilol (COREG) 12.5 MG tablet Take 1 tablet (12.5 mg total) by mouth 2 (two) times daily with a meal. 180 tablet 2  . dofetilide (TIKOSYN) 250 MCG capsule Take 1 capsule (250 mcg total) by mouth 2 (two) times daily. 180 capsule 2  .  furosemide (LASIX) 40 MG tablet Take 1 tablet (40 mg total) by mouth daily. 90 tablet 1  . isosorbide mononitrate (IMDUR) 30 MG 24 hr tablet Take 30 mg by mouth at bedtime.     Marland Kitchen losartan (COZAAR) 50 MG tablet TAKE 1 TABLET BY MOUTH  DAILY 90 tablet 1  . meclizine (ANTIVERT) 25 MG tablet TAKE 1 TABLET BY MOUTH  DAILY AS NEEDED FOR  DIZZINESS. 60 tablet 1  . nitroGLYCERIN (NITROSTAT) 0.4 MG SL tablet DISSOLVE 1 TABLET UNDER THE TONGUE EVERY 5 MINUTES AS  NEEDED FOR CHEST PAIN UP TO 3 DOSES. CALL 911 IF CHEST  PAIN PERSISTS 75 tablet 3  . pantoprazole (PROTONIX) 40 MG tablet TAKE 1 TABLET BY MOUTH  DAILY 90 tablet 1  . Polyethyl Glycol-Propyl Glycol (SYSTANE ULTRA) 0.4-0.3 % SOLN Place 1 drop into both eyes 2 (two) times daily as needed (for dry eyes).     . pravastatin (PRAVACHOL) 40 MG tablet Take 1 tablet (40 mg total) by mouth at bedtime. 90 tablet 2  . rivaroxaban (XARELTO) 20 MG TABS tablet Take 1 tablet (20 mg total) by mouth daily with supper. 30 tablet 3  . vitamin B-12 (CYANOCOBALAMIN) 100 MCG tablet Take 100 mcg by mouth every morning.    Marland Kitchen VITAMIN K PO Take 1 tablet by mouth daily.     No current facility-administered medications for this visit.     Allergies:   Amiodarone; Latex; and Tape   Social History:  The patient  reports that he has never smoked. He has never used smokeless tobacco. He reports that he drinks about 2.0 standard drinks of alcohol per week. He reports that he does not use drugs.   Family History:  The patient's family history includes Alzheimer's disease in his mother; Heart disease in his father; Hyperlipidemia in his father and sister; Hypertension in his father and sister; Prostate cancer in his father.    ROS:  Please see the history of present illness and past medical history  Otherwise, all other systems were reviewed and were negative .     PHYSICAL EXAM: VS:  BP 128/76   Pulse (!) 55   Ht 6' (1.829 m)   Wt 163 lb 6.4 oz (74.1 kg)   SpO2 99%   BMI  22.16 kg/m  , BMI Body mass index is 22.16 kg/m. Well developed and nourished in no acute distress HENT normal Neck supple with JVP-flat Clear Regular rate and rhythm, decreased S1 and early systolic m Abd-soft with active BS No Clubbing cyanosis edema Skin-warm and dry A & Oriented  Grossly normal sensory and motor function   EKG: Sinus @ 55 19/17/  Device interrogation is reviewed today in detail.  See PaceArt for details.   Recent Labs: 07/19/2017: ALT 10; Pro B Natriuretic peptide (BNP) 704.0; TSH 4.93 09/22/2017: Hemoglobin 11.8; Platelets 228 10/07/2017: BUN 18; Creatinine, Ser 1.40; Magnesium 2.3; Potassium 4.5; Sodium 138    Lipid Panel     Component Value Date/Time   CHOL 185 01/15/2017 0840   TRIG 100.0 01/15/2017 0840   HDL 52.60 01/15/2017 0840   CHOLHDL 4 01/15/2017 0840   VLDL 20.0 01/15/2017 0840   LDLCALC 112 (H) 01/15/2017 0840     Wt Readings from Last 3 Encounters:  11/09/17 163 lb 6.4 oz (74.1 kg)  10/18/17 162 lb 12.8 oz (73.8 kg)  10/07/17 160 lb (72.6 kg)      Other studies Reviewed: Additional studies/ records that were reviewed today include:  Myoview scan 2012 demonstrated large scar without ischemia EF was 29%      ASSESSMENT AND PLAN:  Ischemic cardiomyopathy  Implantable defibrillator-Medtronic-single chamber  The patient's device was interrogated.  The information was reviewed. No changes were made in the programming.    Hypertension   IVCD  Atrial fibrillation persistent  Congestive heart failure-chronic-systolic  PVCs  Anemia-chronic  Bradycardia  Hyperlipidiemia Devices approach ERI.  He has been alerted to the alarms  Euvolemic continue current meds  No intercurrent atrial fibrillation or flutter  On Anticoagulation;  No bleeding issues   Without symptoms of ischemia  Bradycardia may or may not be an issue.  We will keep an eye.  At this point seems asymptomatic.  Orders Placed This Encounter    Procedures  . EKG 12-Lead      Virl Axe, MD  11/09/2017 3:44 PM     [address] [place] [address] [phone] (office) [phone] (fax)

## 2017-11-09 NOTE — Patient Instructions (Signed)
Medication Instructions:  Your physician recommends that you continue on your current medications as directed. Please refer to the Current Medication list given to you today.  Labwork: None ordered.  Testing/Procedures: None ordered.  Follow-Up: Your physician recommends that you schedule a follow-up appointment in: One Year with Dr Caryl Comes.  You will be alerted by our device team when it is time to switch out your battery.  Any Other Special Instructions Will Be Listed Below (If Applicable).     If you need a refill on your cardiac medications before your next appointment, please call your pharmacy.

## 2017-11-18 ENCOUNTER — Ambulatory Visit (INDEPENDENT_AMBULATORY_CARE_PROVIDER_SITE_OTHER): Payer: Medicare Other

## 2017-11-18 DIAGNOSIS — I5022 Chronic systolic (congestive) heart failure: Secondary | ICD-10-CM

## 2017-11-18 DIAGNOSIS — Z95810 Presence of automatic (implantable) cardiac defibrillator: Secondary | ICD-10-CM

## 2017-11-18 NOTE — Progress Notes (Signed)
EPIC Encounter for ICM Monitoring  Patient Name: Mark Clayton is a 77 y.o. male Date: 11/18/2017 Primary Care Physican: [name], MD Cardiologist: End Electrophysiologist: Faustino Congress Weight: Previous [id number]       Attempted call to patient and unable to reach.  Left detailed message, per DPR, regarding transmission.  Transmission reviewed.    Thoracic impedance normal.  Prescribed dosage: Furosemide 40 mg 1 tablet daily  Labs: 10/07/2017 Creatinine 1.40, BUN 18, Potassium 4.5, Sodium 138, EGFR 47-55 09/30/2017 Creatinine 1.33, BUN 15, Potassium 4.3, Sodium 138, EGFR 50-58  09/29/2017 Creatinine 1.33, BUN 15, Potassium 3.9, Sodium 136, EGFR 50-58  09/28/2017 Creatinine 1.32, BUN 15, Potassium 3.8, Sodium 137, EGFR 51-59  09/27/2017 Creatinine 1.31, BUN 9,   Potassium 3.9, Sodium 141, EGFR 51-59  09/22/2017 Creatinine 1.50, BUN 13, Potassium 4.2, Sodium 144, EGFR 45-52  08/06/2017 Creatinine 1.13, BUN 8,   Potassium 3.9, Sodium 140, EGFR >60  07/27/2017 Creatinine1.01, BUN14, Potassium3.5, Sodium139 07/19/2017 Creatinine1.08, BUN10, Potassium4.2, Sodium141, VHQI69.62  Acomplete set of results can be found in Results Review.  Recommendations: Left voice mail with ICM number and encouraged to call if experiencing any fluid symptoms.  Follow-up plan: ICM clinic phone appointment on 12/23/2017.       Copy of ICM check sent to Dr. Caryl Comes.   3 month ICM trend: 11/18/2017    1 Year ICM trend:       Rosalene Billings, RN 11/18/2017 9:23 AM

## 2017-12-02 ENCOUNTER — Ambulatory Visit (INDEPENDENT_AMBULATORY_CARE_PROVIDER_SITE_OTHER): Payer: Medicare Other | Admitting: *Deleted

## 2017-12-02 DIAGNOSIS — I5022 Chronic systolic (congestive) heart failure: Secondary | ICD-10-CM | POA: Diagnosis not present

## 2017-12-02 DIAGNOSIS — I255 Ischemic cardiomyopathy: Secondary | ICD-10-CM

## 2017-12-02 NOTE — Progress Notes (Signed)
Remote ICD transmission.   

## 2017-12-20 ENCOUNTER — Other Ambulatory Visit: Payer: Medicare Other | Admitting: *Deleted

## 2017-12-23 ENCOUNTER — Ambulatory Visit (INDEPENDENT_AMBULATORY_CARE_PROVIDER_SITE_OTHER): Payer: Medicare Other

## 2017-12-23 ENCOUNTER — Telehealth: Payer: Self-pay

## 2017-12-23 DIAGNOSIS — Z95810 Presence of automatic (implantable) cardiac defibrillator: Secondary | ICD-10-CM

## 2017-12-23 DIAGNOSIS — I5022 Chronic systolic (congestive) heart failure: Secondary | ICD-10-CM | POA: Diagnosis not present

## 2017-12-23 NOTE — Progress Notes (Signed)
EPIC Encounter for ICM Monitoring  Patient Name: Mark Clayton is a 77 y.o. male Date: 12/23/2017 Primary Care Physican: [name], MD Cardiologist:End Electrophysiologist: Faustino Congress Weight: Previous [id number]      Attempted call to patient and unable to reach.  Left detailed message, per DPR, regarding transmission.  Transmission reviewed.    Thoracic impedance normal.  Prescribed: Furosemide 40 mg 1 tablet daily  Labs: 10/07/2017 Creatinine1.40, BUN18, Potassium4.5, [id number], [id number] 09/30/2017 Creatinine1.33, BUN15, Potassium4.3, ZESPQZ300, EGFR50-58  09/29/2017 Creatinine1.33, BUN15, Potassium3.9, Sodium136, EGFR50-58  09/28/2017 Creatinine1.32, BUN15, Potassium3.8, [id number], KTGY56-38  09/27/2017 Creatinine1.31, BUN9, Potassium 3.9, Sodium141, LHTD42-87  09/22/2017 Creatinine1.50, BUN13, Potassium4.2, GOTLXB262, MBTD97-41  08/06/2017 Creatinine1.13, BUN8, Potassium 3.9, Sodium140, EGFR>60 07/27/2017 Creatinine1.01, BUN14, Potassium3.5, Sodium139 07/19/2017 Creatinine1.08, BUN10, Potassium4.2, Sodium141, ULAG53.64  Acomplete set of results can be found in Results Review.  Recommendations: Left voice mail with ICM number and encouraged to call if experiencing any fluid symptoms.  Follow-up plan: ICM clinic phone appointment on 01/24/2018.    Copy of ICM check sent to Dr. Caryl Comes.   3 month ICM trend: 12/23/2017    1 Year ICM trend:       Rosalene Billings, RN 12/23/2017 3:27 PM

## 2017-12-23 NOTE — Telephone Encounter (Signed)
Remote ICM transmission received.  Attempted call to patient and left detailed message, per DPR, regarding transmission and next ICM scheduled for 01/24/2018.  Advised to return call for any fluid symptoms or questions.

## 2017-12-27 ENCOUNTER — Other Ambulatory Visit: Payer: Medicare Other | Admitting: *Deleted

## 2017-12-27 ENCOUNTER — Other Ambulatory Visit (HOSPITAL_COMMUNITY): Payer: Self-pay | Admitting: *Deleted

## 2017-12-27 DIAGNOSIS — E785 Hyperlipidemia, unspecified: Secondary | ICD-10-CM | POA: Diagnosis not present

## 2017-12-27 DIAGNOSIS — H3562 Retinal hemorrhage, left eye: Secondary | ICD-10-CM | POA: Diagnosis not present

## 2017-12-27 DIAGNOSIS — H353221 Exudative age-related macular degeneration, left eye, with active choroidal neovascularization: Secondary | ICD-10-CM | POA: Diagnosis not present

## 2017-12-27 DIAGNOSIS — H35722 Serous detachment of retinal pigment epithelium, left eye: Secondary | ICD-10-CM | POA: Diagnosis not present

## 2017-12-27 LAB — HEPATIC FUNCTION PANEL
ALT: 7 IU/L (ref 0–44)
AST: 17 IU/L (ref 0–40)
Albumin: 4.2 g/dL (ref 3.5–4.8)
Alkaline Phosphatase: 59 IU/L (ref 39–117)
Bilirubin Total: 0.8 mg/dL (ref 0.0–1.2)
Bilirubin, Direct: 0.18 mg/dL (ref 0.00–0.40)
Total Protein: 6.6 g/dL (ref 6.0–8.5)

## 2017-12-27 LAB — LIPID PANEL
Chol/HDL Ratio: 3.2 ratio (ref 0.0–5.0)
Cholesterol, Total: 193 mg/dL (ref 100–199)
HDL: 60 mg/dL (ref >39)
LDL Calculated: 112 mg/dL — ABNORMAL HIGH (ref 0–99)
Triglycerides: 106 mg/dL (ref 0–149)
VLDL Cholesterol Cal: 21 mg/dL (ref 5–40)

## 2017-12-27 MED ORDER — DOFETILIDE 250 MCG PO CAPS: 250.0000 ug | ORAL_CAPSULE | Freq: Two times a day (BID) | ORAL | 3 refills | Status: DC

## 2017-12-28 LAB — CUP PACEART REMOTE DEVICE CHECK
Battery Voltage: 2.63 V
Brady Statistic RV Percent Paced: 0.84 %
Date Time Interrogation Session: 20190905083627
HighPow Impedance: 44 Ohm
HighPow Impedance: 55 Ohm
Implantable Lead Implant Date: 20041020
Implantable Lead Implant Date: 20100913
Implantable Lead Location: 753860
Implantable Lead Location: 753860
Implantable Lead Model: 5076
Implantable Lead Model: 6949
Implantable Pulse Generator Implant Date: 20100913
Lead Channel Impedance Value: 437 Ohm
Lead Channel Sensing Intrinsic Amplitude: 31.625 mV
Lead Channel Sensing Intrinsic Amplitude: 31.625 mV
Lead Channel Setting Pacing Amplitude: 3.5 V
Lead Channel Setting Pacing Pulse Width: 0.8 ms
Lead Channel Setting Sensing Sensitivity: 0.45 mV

## 2017-12-29 ENCOUNTER — Telehealth: Payer: Self-pay

## 2017-12-29 MED ORDER — EZETIMIBE 10 MG PO TABS: 10.0000 mg | ORAL_TABLET | Freq: Every day | ORAL | 3 refills | Status: DC

## 2017-12-29 NOTE — Telephone Encounter (Signed)
Notes recorded by Frederik Schmidt, RN on 12/29/2017 at 8:49 AM EDT Informed patient of results/recoomendations. He will call us with determination of Ezetimibe (Zetia). He is checking on cost. ------

## 2017-12-29 NOTE — Telephone Encounter (Signed)
Patient called back and has informed me that his Zetia will be free.  I sent in prescription.

## 2017-12-29 NOTE — Telephone Encounter (Signed)
-----   Message from Nelva Bush, MD sent at 12/28/2017  6:47 AM EDT ----- Please let [name] know that his LDL remains suboptimally controlled at 112 (goal < 70).  Given that he has been intolerant of other statins in the past, I recommend that we add ezetimibe 10 mg daily and recheck a lipid panel and ALT in 3 months.  His LFT's are normal today.

## 2017-12-29 NOTE — Telephone Encounter (Signed)
-----   Message from Nelva Bush, MD sent at 12/28/2017  6:47 AM EDT ----- Please let Mark Clayton know that his LDL remains suboptimally controlled at 112 (goal < 70).  Given that he has been intolerant of other statins in the past, I recommend that we add ezetimibe 10 mg daily and recheck a lipid panel and ALT in 3 months.  His LFT's are normal today.

## 2017-12-30 ENCOUNTER — Other Ambulatory Visit: Payer: Self-pay | Admitting: Cardiology

## 2018-01-17 ENCOUNTER — Other Ambulatory Visit (INDEPENDENT_AMBULATORY_CARE_PROVIDER_SITE_OTHER): Payer: Medicare Other

## 2018-01-17 ENCOUNTER — Ambulatory Visit (INDEPENDENT_AMBULATORY_CARE_PROVIDER_SITE_OTHER): Payer: Medicare Other | Admitting: Internal Medicine

## 2018-01-17 ENCOUNTER — Encounter: Payer: Self-pay | Admitting: Internal Medicine

## 2018-01-17 VITALS — BP 120/70 | HR 56 | Temp 97.7°F | Ht 72.0 in | Wt 164.0 lb

## 2018-01-17 DIAGNOSIS — I5022 Chronic systolic (congestive) heart failure: Secondary | ICD-10-CM

## 2018-01-17 DIAGNOSIS — Z23 Encounter for immunization: Secondary | ICD-10-CM | POA: Diagnosis not present

## 2018-01-17 DIAGNOSIS — Z0000 Encounter for general adult medical examination without abnormal findings: Secondary | ICD-10-CM

## 2018-01-17 DIAGNOSIS — I4819 Other persistent atrial fibrillation: Secondary | ICD-10-CM

## 2018-01-17 DIAGNOSIS — Z85038 Personal history of other malignant neoplasm of large intestine: Secondary | ICD-10-CM

## 2018-01-17 DIAGNOSIS — E785 Hyperlipidemia, unspecified: Secondary | ICD-10-CM

## 2018-01-17 LAB — CBC
HCT: 37.9 % — ABNORMAL LOW (ref 39.0–52.0)
Hemoglobin: 12.8 g/dL — ABNORMAL LOW (ref 13.0–17.0)
MCHC: 33.8 g/dL (ref 30.0–36.0)
MCV: 88.6 fl (ref 78.0–100.0)
Platelets: 203 10*3/uL (ref 150.0–400.0)
RBC: 4.28 Mil/uL (ref 4.22–5.81)
RDW: 15 % (ref 11.5–15.5)
WBC: 6.2 10*3/uL (ref 4.0–10.5)

## 2018-01-17 LAB — COMPREHENSIVE METABOLIC PANEL
ALT: 7 U/L (ref 0–53)
AST: 14 U/L (ref 0–37)
Albumin: 4.1 g/dL (ref 3.5–5.2)
Alkaline Phosphatase: 48 U/L (ref 39–117)
BUN: 13 mg/dL (ref 6–23)
CO2: 28 mEq/L (ref 19–32)
Calcium: 9.5 mg/dL (ref 8.4–10.5)
Chloride: 104 mEq/L (ref 96–112)
Creatinine, Ser: 1.24 mg/dL (ref 0.40–1.50)
GFR: 60.06 mL/min (ref >60.00)
Glucose, Bld: 117 mg/dL — ABNORMAL HIGH (ref 70–99)
Potassium: 3.9 mEq/L (ref 3.5–5.1)
Sodium: 140 mEq/L (ref 135–145)
Total Bilirubin: 1.1 mg/dL (ref 0.2–1.2)
Total Protein: 7.2 g/dL (ref 6.0–8.3)

## 2018-01-17 LAB — LIPID PANEL
Cholesterol: 181 mg/dL (ref 0–200)
HDL: 56.4 mg/dL (ref >39.00)
LDL Cholesterol: 106 mg/dL — ABNORMAL HIGH (ref 0–99)
NonHDL: 124.79
Total CHOL/HDL Ratio: 3
Triglycerides: 94 mg/dL (ref 0.0–149.0)
VLDL: 18.8 mg/dL (ref 0.0–40.0)

## 2018-01-17 LAB — BRAIN NATRIURETIC PEPTIDE: Pro B Natriuretic peptide (BNP): 397 pg/mL — ABNORMAL HIGH (ref 0.0–100.0)

## 2018-01-17 NOTE — Assessment & Plan Note (Signed)
Checking BNP and CMP. Weight is stable and he is taking extra lasix about 1-2 times per week although none extra in the last month or so. Is careful about diet. No flare today.

## 2018-01-17 NOTE — Assessment & Plan Note (Signed)
Colonoscopy Feb 2019 and due in 3 years.

## 2018-01-17 NOTE — Assessment & Plan Note (Signed)
Now on tikosyn therapy and doing better. On xarelto for anticoagulation. Coreg for rate control.

## 2018-01-17 NOTE — Assessment & Plan Note (Signed)
Flu shot given. Pneumonia complete. Shingrix counseled. Tetanus up to date. Colonoscopy up to date. Counseled about sun safety and mole surveillance. Counseled about the dangers of distracted driving. Given 10 year screening recommendations.

## 2018-01-17 NOTE — Assessment & Plan Note (Signed)
Cardiology added zetia to pravastatin and checking CMP and lipid panel today for efficacy.

## 2018-01-17 NOTE — Patient Instructions (Signed)

## 2018-01-17 NOTE — Progress Notes (Signed)
   Subjective:    Patient ID: ESVIN HNAT, male    DOB: 1940/12/12, 77 y.o.   MRN: [id number]  HPI Here for medicare wellness and physical, no new complaints. Please see A/P for status and treatment of chronic medical problems.   Diet: heart healthy Physical activity: sedentary Depression/mood screen: negative Hearing: intact to whispered voice, mild loss bilaterally  Visual acuity: grossly normal, performs annual eye exam  ADLs: capable Fall risk: low Home safety: good Cognitive evaluation: intact to orientation, naming, recall and repetition EOL planning: adv directives discussed  I have personally reviewed and have noted 1. The patient's medical and social history - reviewed today no changes 2. Their use of alcohol, tobacco or illicit drugs 3. Their current medications and supplements 4. The patient's functional ability including ADL's, fall risks, home safety risks and hearing or visual impairment. 5. Diet and physical activities 6. Evidence for depression or mood disorders 7. Care team reviewed and updated (available in snapshot)  Review of Systems  Constitutional: Negative.   HENT: Negative.   Eyes: Negative.   Respiratory: Negative for cough, chest tightness and shortness of breath.   Cardiovascular: Negative for chest pain, palpitations and leg swelling.  Gastrointestinal: Negative for abdominal distention, abdominal pain, constipation, diarrhea, nausea and vomiting.  Musculoskeletal: Negative.   Skin: Negative.   Neurological: Negative.   Psychiatric/Behavioral: Negative.       Objective:   Physical Exam  Constitutional: He is oriented to person, place, and time. He appears well-developed and well-nourished.  HENT:  Head: Normocephalic and atraumatic.  Eyes: EOM are normal.  Neck: Normal range of motion.  Cardiovascular: Normal rate and regular rhythm.  Pulmonary/Chest: Effort normal and breath sounds normal. No respiratory distress. He has no wheezes. He  has no rales.  Abdominal: Soft. Bowel sounds are normal. He exhibits no distension. There is no tenderness. There is no rebound.  Musculoskeletal: He exhibits no edema.  Neurological: He is alert and oriented to person, place, and time. Coordination normal.  Skin: Skin is warm and dry.  Psychiatric: He has a normal mood and affect.   Vitals:   01/17/18 0756  BP: 120/70  Pulse: (!) 56  Temp: 97.7 F (36.5 C)  TempSrc: Oral  SpO2: 99%  Weight: 164 lb (74.4 kg)  Height: 6' (1.829 m)      Assessment & Plan:  Flu shot given at visit

## 2018-01-21 ENCOUNTER — Ambulatory Visit: Payer: Medicare Other | Admitting: Internal Medicine

## 2018-01-21 ENCOUNTER — Encounter: Payer: Self-pay | Admitting: Internal Medicine

## 2018-01-21 VITALS — BP 140/80 | HR 65 | Ht 72.0 in | Wt 166.5 lb

## 2018-01-21 DIAGNOSIS — I25119 Atherosclerotic heart disease of native coronary artery with unspecified angina pectoris: Secondary | ICD-10-CM

## 2018-01-21 DIAGNOSIS — I10 Essential (primary) hypertension: Secondary | ICD-10-CM | POA: Diagnosis not present

## 2018-01-21 DIAGNOSIS — I255 Ischemic cardiomyopathy: Secondary | ICD-10-CM | POA: Diagnosis not present

## 2018-01-21 DIAGNOSIS — E785 Hyperlipidemia, unspecified: Secondary | ICD-10-CM

## 2018-01-21 DIAGNOSIS — I4819 Other persistent atrial fibrillation: Secondary | ICD-10-CM | POA: Diagnosis not present

## 2018-01-21 DIAGNOSIS — I5022 Chronic systolic (congestive) heart failure: Secondary | ICD-10-CM | POA: Diagnosis not present

## 2018-01-21 NOTE — Patient Instructions (Signed)
Medication Instructions:  Your physician recommends that you continue on your current medications as directed. Please refer to the Current Medication list given to you today.  If you need a refill on your cardiac medications before your next appointment, please call your pharmacy.   Lab work: none If you have labs (blood work) drawn today and your tests are completely normal, you will receive your results only by: Marland Kitchen MyChart Message (if you have MyChart) OR . A paper copy in the mail If you have any lab test that is abnormal or we need to change your treatment, we will call you to review the results.  Testing/Procedures: none  Follow-Up: At [facility], you and your health needs are our priority.  As part of our continuing mission to provide you with exceptional heart care, we have created designated Provider Care Teams.  These Care Teams include your primary Cardiologist (physician) and Advanced Practice Providers (APPs -  Physician Assistants and Nurse Practitioners) who all work together to provide you with the care you need, when you need it. You will need a follow up appointment in 3 months.  Early morning appointment so patient may have fasting lab work that day. You may see Nelva Bush, MD or one of the following Advanced Practice Providers on your designated Care Team:   Murray Hodgkins, NP Christell Faith, PA-C . Marrianne Mood, PA-C

## 2018-01-21 NOTE — Progress Notes (Signed)
Follow-up Outpatient Visit Date: 01/21/2018  Primary Care Provider: [name], MD [address] [place] [address]  Chief Complaint: Follow-up heart failure and coronary artery disease  HPI:  Mark Clayton is a 77 y.o. year-old male with history of coronary artery disease s/p CABG, chronic systolic heart failure due to ischemic cardiomyopathy, paroxysmal atrial fibrillation, hypertension, hyperlipidemia, colon cancer, and anemia, who presents for follow-up of coronary artery disease and cardiomyopathy.  I last saw Mark Clayton in July, at which time he was feeling well after having returned to sinus rhythm.  He noted that he was taking carvedilol 25 mg once daily.  Spironolactone had also been held at the time of his dofetilide loading.  I asked him to switch carvedilol to 12.5 mg twice daily.  Subsequent lipid panel showed suboptimal lipid control for which ezetimibe was added to pravastatin.  Today, Mark Clayton reports feeling well.  He has noted some tightness in his chest when he goes outside in the cold air over the last couple of weeks.  He resolves when he goes back inside or wears a coat.  He has not had any exertional chest pain or shortness of breath, palpitations, lightheadedness, orthopnea, or edema.  He is tolerating his medications well.  He has been taking ezetimibe for the last 2 to 3 weeks.  Mark Clayton notes some numbness around his ankles.  He describes it as a bandage being in place.  This typically occurs at the [name] of the day after he has been standing for extended periods.  He does not have any pain in his legs with activity.  He notes occasional foot/ankle cramping at night.  --------------------------------------------------------------------------------------------------  Past Medical History:  Diagnosis Date  . AICD (automatic cardioverter/defibrillator) present 01/17/2003   Medtronic Maximo 7232CX ICD, serial [id number]  . Anemia 02-06-11   takes oral  iron  . Arthritis    hands, knees  . CAD (coronary artery disease) 2003   a. h/o MI and CABG in 2003. b. s/p DES to SVG-RPDA-RPLB in 08/2014.  Marland Kitchen Cancer of sigmoid colon ([place]) 2012   a. s/p colon surgery.  . Carotid bruit   . Chronic systolic CHF (congestive heart failure) (HCC)    a. EF 20% in 2014.  Marland Kitchen Cough   . GERD (gastroesophageal reflux disease) 02-06-11  . HTN (hypertension)   . Hyperlipidemia   . Ischemic cardiomyopathy    a. EF 20% in 2014. (Master study EF >20%)  . LV (left ventricular) mural thrombus    a. Last echo 12/2012 EF 20% with mural thrombus, mod-severe left atrial enlargement. Per Dr. Olin Pia notes from that time, echo in 2009 demonstrated something similar - he had been on warfarin following prior MI and this was stopped after a number of months. Given lack of data regarding anticoagulation from chronic clot, he has not been on anticoagulation since.  . Macular degeneration   . Myocardial infarct [facility])    2003   Past Surgical History:  Procedure Laterality Date  . CARDIAC CATHETERIZATION N/A 09/20/2014   Procedure: Left Heart Cath and Coronary Angiography;  Surgeon: Jettie Booze, MD; LAD 95%, D1 100%, CFX liner percent, OM 200%, OM 390%, RCA 90%, LIMA-LAD okay, SVG-OM 2-OM 3 minimal disease, SVG-RPDA-RPLB 100% between the RPDA and RPL     . CARDIAC CATHETERIZATION N/A 09/20/2014   Procedure: Coronary Stent Intervention;  Surgeon: Jettie Booze, MD; Synergy DES 4 x 24 mm reducing the stenosis to 5%   .  CARDIAC DEFIBRILLATOR PLACEMENT  01/17/03   6949 lead. Medtronic. remote-no; with later revision  . CARDIOVERSION N/A 05/22/2016   Procedure: CARDIOVERSION;  Surgeon: Lelon Perla, MD;  Location: [facility] ENDOSCOPY;  Service: Cardiovascular;  Laterality: N/A;  . CARDIOVERSION N/A 08/10/2017   Procedure: CARDIOVERSION;  Surgeon: Pixie Casino, MD;  Location: [facility] ENDOSCOPY;  Service: Cardiovascular;  Laterality: N/A;  . CATARACT EXTRACTION W/ INTRAOCULAR LENS   IMPLANT, BILATERAL Bilateral June/-July 2009   Dr. Katy Fitch  . COLON RESECTION  02/09/2011   Procedure: LAPAROSCOPIC SIGMOID COLON RESECTION;  Surgeon: Pedro Earls, MD;  Location: [facility] ORS;  Service: General;  Laterality: N/A;  Laparoscopic Assisted Sigmoid Colectomy  . COLON SURGERY    . COLONOSCOPY  08/31/2011   Procedure: COLONOSCOPY;  Surgeon: Jerene Bears, MD;  Location: [facility] ENDOSCOPY;  Service: Gastroenterology;  Laterality: N/A;  . COLONOSCOPY N/A 09/05/2012   Procedure: COLONOSCOPY;  Surgeon: Jerene Bears, MD;  Location: [facility] ENDOSCOPY;  Service: Gastroenterology;  Laterality: N/A;  . COLONOSCOPY N/A 04/18/2013   Procedure: COLONOSCOPY;  Surgeon: Jerene Bears, MD;  Location: [facility] ENDOSCOPY;  Service: Gastroenterology;  Laterality: N/A;  . COLONOSCOPY N/A 04/09/2014   Procedure: COLONOSCOPY;  Surgeon: Jerene Bears, MD;  Location: [facility] ENDOSCOPY;  Service: Gastroenterology;  Laterality: N/A;  . COLONOSCOPY WITH PROPOFOL N/A 05/03/2017   Procedure: COLONOSCOPY WITH PROPOFOL;  Surgeon: Jerene Bears, MD;  Location: [facility] ENDOSCOPY;  Service: Gastroenterology;  Laterality: N/A;  . CORONARY ANGIOPLASTY    . CORONARY ARTERY BYPASS GRAFT  01/2002   LIMA-LAD, SVG-OM 2-OM 3, SVG-RPDA-RPLB  . ICD GENERATOR CHANGE  2010   Medtronic Virtuoso II VR ICD  . INGUINAL HERNIA REPAIR Right 2000's X 2  . LAPAROSCOPIC RIGHT HEMI COLECTOMY N/A 11/04/2012   Procedure: LAPAROSCOPIC RIGHT HEMI COLECTOMY;  Surgeon: Pedro Earls, MD;  Location: [facility] ORS;  Service: General;  Laterality: N/A;  . TONSILLECTOMY  ~ 1950    Current Meds  Medication Sig  . carvedilol (COREG) 12.5 MG tablet Take 1 tablet (12.5 mg total) by mouth 2 (two) times daily with a meal.  . dofetilide (TIKOSYN) 250 MCG capsule Take 1 capsule (250 mcg total) by mouth 2 (two) times daily.  Marland Kitchen ezetimibe (ZETIA) 10 MG tablet Take 1 tablet (10 mg total) by mouth daily.  . furosemide (LASIX) 40 MG tablet Take 1 tablet (40 mg total) by mouth daily.  . isosorbide  mononitrate (IMDUR) 30 MG 24 hr tablet Take 30 mg by mouth at bedtime.   Marland Kitchen losartan (COZAAR) 50 MG tablet TAKE 1 TABLET BY MOUTH  DAILY  . meclizine (ANTIVERT) 25 MG tablet TAKE 1 TABLET BY MOUTH  DAILY AS NEEDED FOR  DIZZINESS.  . nitroGLYCERIN (NITROSTAT) 0.4 MG SL tablet DISSOLVE 1 TABLET UNDER THE TONGUE EVERY 5 MINUTES AS  NEEDED FOR CHEST PAIN UP TO 3 DOSES. CALL 911 IF CHEST  PAIN PERSISTS  . pantoprazole (PROTONIX) 40 MG tablet TAKE 1 TABLET BY MOUTH  DAILY  . Polyethyl Glycol-Propyl Glycol (SYSTANE ULTRA) 0.4-0.3 % SOLN Place 1 drop into both eyes 2 (two) times daily as needed (for dry eyes).   . pravastatin (PRAVACHOL) 40 MG tablet Take 1 tablet (40 mg total) by mouth at bedtime.  . rivaroxaban (XARELTO) 20 MG TABS tablet Take 1 tablet (20 mg total) by mouth daily with supper.  . vitamin B-12 (CYANOCOBALAMIN) 100 MCG tablet Take 100 mcg by mouth every morning.  Marland Kitchen VITAMIN K PO Take 1 tablet by mouth  daily.    Allergies: Amiodarone; Latex; and Tape  Social History   Tobacco Use  . Smoking status: Never Smoker  . Smokeless tobacco: Never Used  Substance Use Topics  . Alcohol use: Yes    Alcohol/week: 2.0 standard drinks    Types: 2 Cans of beer per week  . Drug use: No    Family History  Problem Relation Age of Onset  . Hypertension Father   . Hyperlipidemia Father   . Heart disease Father   . Prostate cancer Father   . Alzheimer's disease Mother   . Hypertension Sister   . Hyperlipidemia Sister   . Colon cancer Neg Hx   . Esophageal cancer Neg Hx   . Rectal cancer Neg Hx   . Stomach cancer Neg Hx     Review of Systems: A 12-system review of systems was performed and was negative except as noted in the HPI.  --------------------------------------------------------------------------------------------------  Physical Exam: BP 140/80 (BP Location: Left Arm, Patient Position: Sitting, Cuff Size: Normal)   Pulse 65   Ht 6' (1.829 m)   Wt 166 lb 8 oz (75.5 kg)    BMI 22.58 kg/m   General: NAD. HEENT: No conjunctival pallor or scleral icterus. Moist mucous membranes.  OP clear. Neck: Supple without lymphadenopathy, thyromegaly, JVD, or HJR. Lungs: Normal work of breathing. Clear to auscultation bilaterally without wheezes or crackles. Heart: Regular rate and rhythm without murmurs, rubs, or gallops. Non-displaced PMI. Abd: Bowel sounds present. Soft, NT/ND without hepatosplenomegaly Ext: No lower extremity edema. Radial, PT, and DP pulses are 2+ bilaterally. Skin: Warm and dry without rash.  EKG: Normal sinus rhythm with left bundle branch block.  QT 480 ms by my measurement.  Lab Results  Component Value Date   WBC 6.2 01/17/2018   HGB 12.8 (L) 01/17/2018   HCT 37.9 (L) 01/17/2018   MCV 88.6 01/17/2018   PLT 203.0 01/17/2018    Lab Results  Component Value Date   NA 140 01/17/2018   K 3.9 01/17/2018   CL 104 01/17/2018   CO2 28 01/17/2018   BUN 13 01/17/2018   CREATININE 1.24 01/17/2018   GLUCOSE 117 (H) 01/17/2018   ALT 7 01/17/2018    Lab Results  Component Value Date   CHOL 181 01/17/2018   HDL 56.40 01/17/2018   LDLCALC 106 (H) 01/17/2018   TRIG 94.0 01/17/2018   CHOLHDL 3 01/17/2018    --------------------------------------------------------------------------------------------------  ASSESSMENT AND PLAN: Coronary artery disease with atypical angina Mark Clayton reports a few episodes of tightness when outside in the cold.  This is not exertional.  It also resolves when he puts on a jacket.  We have agreed to defer additional testing for now and continue his current antianginal therapy consisting of carvedilol and isosorbide mononitrate.  He should contact us if the symptoms become more frequent.  I do not think a myocardial perfusion stress test would be of much utility, given his history of ischemic cardiomyopathy and severely reduced LVEF.  We will continue secondary prevention with pravastatin and ezetimibe.  He has been  intolerant of other statins in the past.  He is not on aspirin given indefinite anticoagulation with rivaroxaban.  Chronic systolic heart failure due to ischemic cardiomyopathy Mark Clayton appears euvolemic with NYHA class II heart failure symptoms.  We discussed escalation of losartan, but Mark Clayton would like to defer this given intermittent lightheadedness with soft blood pressures in the past.  Spironolactone was previously discontinued due to this.  Continue  device follow-up with EP.  Persistent atrial fibrillation. No symptoms of recurrence noted by the patient.  He is tolerating dofetilide and rivaroxaban well.  EKG today shows sinus rhythm with acceptable QT interval.  Potassium was 3.9 on last check 4 days ago.  Continue current medications and follow-up with EP as previously discussed.  Hypertension Blood pressure mildly elevated today but better at home and at recent office visits with his PCP.  We have agreed to defer medication changes at this time.  I asked Mark Clayton to contact us if his pressure is consistently above 130/80.  Hyperlipidemia Mark Clayton is tolerating ezetimibe and pravastatin well.  LDL earlier this week was still above goal at 106.  It should be noted that he has only been taking ezetimibe for about 2 weeks.  We will plan to repeat a fasting lipid panel when he sees me in 3 months.  If LDL is still above goal, we will need to consider referral to the lipid clinic to discuss PCSK9 inhibitor therapy, as he has been intolerant of other statins in the past.  Follow-up: Return to clinic in 3 months.  Nelva Bush, MD 01/21/2018 8:32 AM

## 2018-01-24 ENCOUNTER — Ambulatory Visit (INDEPENDENT_AMBULATORY_CARE_PROVIDER_SITE_OTHER): Payer: Medicare Other

## 2018-01-24 ENCOUNTER — Telehealth: Payer: Self-pay

## 2018-01-24 DIAGNOSIS — Z95810 Presence of automatic (implantable) cardiac defibrillator: Secondary | ICD-10-CM

## 2018-01-24 DIAGNOSIS — I5022 Chronic systolic (congestive) heart failure: Secondary | ICD-10-CM | POA: Diagnosis not present

## 2018-01-24 NOTE — Telephone Encounter (Signed)
Remote ICM transmission received.  Attempted call to patient regarding ICM remote transmission and left detailed message, per DPR, with next ICM remote transmission date of 03/03/2018.  Advised to return call for any fluid symptoms or questions.

## 2018-01-24 NOTE — Progress Notes (Signed)
EPIC Encounter for ICM Monitoring  Patient Name: Mark Clayton is a 77 y.o. male Date: 01/24/2018 Primary Care Physican: [name], MD Cardiologist:End Electrophysiologist: Faustino Congress Weight: 166lbs (office weight 01/21/18)    Attempted call to patient and unable to reach.  Left detailed message, per DPR, regarding transmission.  Transmission reviewed.    Thoracic impedance normal.   Prescribed: Furosemide 40 mg 1 tablet daily  Labs: 01/17/2018 Creatinine 1.24, BUN 13, Potassium 3.9, Sodium 140, eGFR 60.06 10/07/2017 Creatinine1.40, BUN18, Potassium4.5, [id number], GSPJ24-19 09/30/2017 Creatinine1.33, BUN15, Potassium4.3, RVACQP848, EGFR50-58  09/29/2017 Creatinine1.33, BUN15, Potassium3.9, Sodium136, EGFR50-58  09/28/2017 Creatinine1.32, BUN15, Potassium3.8, LTYVDP322, VOHC09-19  09/27/2017 Creatinine1.31, BUN9, Potassium 3.9, Sodium141, CKIC17-98  09/22/2017 Creatinine1.50, BUN13, Potassium4.2, VSYVGC628, OOJZ53-01  08/06/2017 Creatinine1.13, BUN8, Potassium 3.9, Sodium140, EGFR>60 07/27/2017 Creatinine1.01, BUN14, Potassium3.5, Sodium139 07/19/2017 Creatinine1.08, BUN10, Potassium4.2, Sodium141, UAUE59.13  Acomplete set of results can be found in Results Review.  Recommendations: Left voice mail with ICM number and encouraged to call if experiencing any fluid symptoms.  Follow-up plan: ICM clinic phone appointment on 03/03/2018.    Copy of ICM check sent to Dr. Caryl Comes.   3 month ICM trend: 01/24/2018    1 Year ICM trend:       Rosalene Billings, RN 01/24/2018 9:22 AM

## 2018-02-09 ENCOUNTER — Other Ambulatory Visit: Payer: Self-pay | Admitting: Internal Medicine

## 2018-02-09 NOTE — Telephone Encounter (Signed)
Xarelto 20mg  refill request received; pt is 77 yrs old, Wt-75.5kg, Crea-1.24 on 01/17/18, last seen by Dr. Saunders Revel on 01/21/18, CrCl-53.104ml/min; will send in refill to requested pharmacy.

## 2018-02-14 ENCOUNTER — Telehealth: Payer: Self-pay | Admitting: Internal Medicine

## 2018-02-14 ENCOUNTER — Other Ambulatory Visit: Payer: Self-pay | Admitting: Internal Medicine

## 2018-02-14 NOTE — Telephone Encounter (Signed)
Patient has dropped off a renewal form for a handicapped drivers registration plate.   Form has been completed & placed in providers box to review and sign if she approves.

## 2018-02-15 NOTE — Telephone Encounter (Signed)
Signed, Copy sent to scan.   Patient informed & will pick up original.

## 2018-02-21 DIAGNOSIS — H353212 Exudative age-related macular degeneration, right eye, with inactive choroidal neovascularization: Secondary | ICD-10-CM | POA: Diagnosis not present

## 2018-02-21 DIAGNOSIS — H353122 Nonexudative age-related macular degeneration, left eye, intermediate dry stage: Secondary | ICD-10-CM | POA: Diagnosis not present

## 2018-02-21 DIAGNOSIS — H353221 Exudative age-related macular degeneration, left eye, with active choroidal neovascularization: Secondary | ICD-10-CM | POA: Diagnosis not present

## 2018-02-21 DIAGNOSIS — H35722 Serous detachment of retinal pigment epithelium, left eye: Secondary | ICD-10-CM | POA: Diagnosis not present

## 2018-03-03 ENCOUNTER — Ambulatory Visit (INDEPENDENT_AMBULATORY_CARE_PROVIDER_SITE_OTHER): Payer: Medicare Other

## 2018-03-03 DIAGNOSIS — Z95810 Presence of automatic (implantable) cardiac defibrillator: Secondary | ICD-10-CM | POA: Diagnosis not present

## 2018-03-03 DIAGNOSIS — I5022 Chronic systolic (congestive) heart failure: Secondary | ICD-10-CM | POA: Diagnosis not present

## 2018-03-03 DIAGNOSIS — I255 Ischemic cardiomyopathy: Secondary | ICD-10-CM | POA: Diagnosis not present

## 2018-03-03 NOTE — Progress Notes (Signed)
Remote ICD transmission.   

## 2018-03-03 NOTE — Progress Notes (Signed)
EPIC Encounter for ICM Monitoring  Patient Name: Mark Clayton is a 77 y.o. male Date: 03/03/2018 Primary Care Physican: [name], MD Cardiologist:End Electrophysiologist: Caryl Comes Last Weight: 166lbs(office weight 01/21/18)       Today's Weight: 161 lbs        Heart Failure questions reviewed, pt asymptomatic.   Thoracic impedance normal.   Prescribed: Furosemide 40 mg 1 tablet daily  Labs: 01/17/2018 Creatinine 1.24, BUN 13, Potassium 3.9, Sodium 140, eGFR 60.06 10/07/2017 Creatinine1.40, BUN18, Potassium4.5, JFJKNI000, DIRY78-89 09/30/2017 Creatinine1.33, BUN15, Potassium4.3, BHQSUI666, EGFR50-58  09/29/2017 Creatinine1.33, BUN15, Potassium3.9, Sodium136, EGFR50-58  09/28/2017 Creatinine1.32, BUN15, Potassium3.8, OUMNAR224, WINE09-70  09/27/2017 Creatinine1.31, BUN9, Potassium 3.9, Sodium141, OYPO52-41  09/22/2017 Creatinine1.50, BUN13, Potassium4.2, DVIPNY419, RCOI81-44  08/06/2017 Creatinine1.13, BUN8, Potassium 3.9, Sodium140, EGFR>60 07/27/2017 Creatinine1.01, BUN14, Potassium3.5, Sodium139 07/19/2017 Creatinine1.08, BUN10, Potassium4.2, Sodium141, PVIC59.97  Acomplete set of results can be found in Results Review.  Recommendations: No changes.  Encouraged to call for fluid symptoms.  Follow-up plan: ICM clinic phone appointment on 04/04/2018.    Copy of ICM check sent to Dr. Caryl Comes.   3 month ICM trend: 03/03/2018    1 Year ICM trend:       Rosalene Billings, RN 03/03/2018 12:03 PM

## 2018-03-08 ENCOUNTER — Encounter: Payer: Self-pay | Admitting: Cardiology

## 2018-03-30 ENCOUNTER — Other Ambulatory Visit: Payer: Self-pay | Admitting: Internal Medicine

## 2018-03-30 DIAGNOSIS — I5022 Chronic systolic (congestive) heart failure: Secondary | ICD-10-CM

## 2018-03-30 DIAGNOSIS — Z95810 Presence of automatic (implantable) cardiac defibrillator: Secondary | ICD-10-CM

## 2018-03-30 DIAGNOSIS — I255 Ischemic cardiomyopathy: Secondary | ICD-10-CM

## 2018-04-01 ENCOUNTER — Other Ambulatory Visit (HOSPITAL_COMMUNITY): Payer: Self-pay | Admitting: *Deleted

## 2018-04-01 MED ORDER — DOFETILIDE 250 MCG PO CAPS: 250.0000 ug | ORAL_CAPSULE | Freq: Two times a day (BID) | ORAL | 2 refills | Status: DC

## 2018-04-04 ENCOUNTER — Other Ambulatory Visit: Payer: Self-pay | Admitting: Internal Medicine

## 2018-04-04 ENCOUNTER — Ambulatory Visit (INDEPENDENT_AMBULATORY_CARE_PROVIDER_SITE_OTHER): Payer: Medicare Other

## 2018-04-04 DIAGNOSIS — Z95810 Presence of automatic (implantable) cardiac defibrillator: Secondary | ICD-10-CM

## 2018-04-04 DIAGNOSIS — I5022 Chronic systolic (congestive) heart failure: Secondary | ICD-10-CM

## 2018-04-04 DIAGNOSIS — I255 Ischemic cardiomyopathy: Secondary | ICD-10-CM

## 2018-04-04 NOTE — Progress Notes (Signed)
EPIC Encounter for ICM Monitoring  Patient Name: Mark Clayton is a 78 y.o. male Date: 04/04/2018 Primary Care Physican: [name], MD Cardiologist:End Electrophysiologist: Caryl Comes Last Weight: 161 lbs  Today's Weight: 161.8 lbs        Heart Failure questions reviewed.   Pt symptomatic with weight gain up to 163 lbs and slight ankle swelling which has improved symptoms but symptoms almost resolved since taking extra Lasix yesterday.    Report: Thoracic impedance slightly abnormal suggesting fluid accumulation but trending to baseline.   Prescribed: Furosemide 40 mg 1 tablet daily  Labs: 01/17/2018 Creatinine 1.24, BUN 13, Potassium 3.9, Sodium 140, eGFR 60.06 10/07/2017 Creatinine1.40, BUN18, Potassium4.5, TXMIWO032, ZYYQ82-50 09/30/2017 Creatinine1.33, BUN15, Potassium4.3, IBBCWU889, EGFR50-58  09/29/2017 Creatinine1.33, BUN15, Potassium3.9, Sodium136, EGFR50-58  09/28/2017 Creatinine1.32, BUN15, Potassium3.8, VQXIHW388, EKCM03-49  09/27/2017 Creatinine1.31, BUN9, Potassium 3.9, Sodium141, ZPHX50-56  09/22/2017 Creatinine1.50, BUN13, Potassium4.2, PVXYIA165, VVZS82-70  08/06/2017 Creatinine1.13, BUN8, Potassium 3.9, Sodium140, EGFR>60 07/27/2017 Creatinine1.01, BUN14, Potassium3.5, Sodium139 07/19/2017 Creatinine1.08, BUN10, Potassium4.2, Sodium141, BEML54.49  Acomplete set of results can be found in Results Review.  Recommendations: No changes.  Encouraged to call for fluid symptoms.  Follow-up plan: ICM clinic phone appointment on 05/09/2018.    Copy of ICM check sent to Dr. Caryl Comes.   3 month ICM trend: 04/04/2018    1 Year ICM trend:       Rosalene Billings, RN 04/04/2018 5:06 PM

## 2018-04-14 ENCOUNTER — Telehealth: Payer: Self-pay

## 2018-04-14 NOTE — Telephone Encounter (Signed)
Reviewed with Dr Caryl Comes in the office.  Dr Caryl Comes agreed patient can take extra Furosemide when needed and to discuss with Dr End at the 05/09/2018.  No changes today but advised to call back if condition worsens.

## 2018-04-14 NOTE — Telephone Encounter (Signed)
Call to patient as requested by voice mail message.  He reported taking 2 Furosemide most days due to weight fluctuating by 3 lbs at a time over the last couple of weeks.  Yesterday he weighed 165 lbs and after taking extra Furosemide last night, today's weight is 161 lbs.  He reported he is having difficulty keeping the weight at baseline.  He will send remote transmission when he gets home today for review.

## 2018-04-14 NOTE — Telephone Encounter (Addendum)
Returned call after reviewing remote transmission.  Patient feels he's taking extra Furosemide too often.  He took extra Furosemide 40 mg tablet 1/8, 1/12, and today (1/16).  He reported the physician has instructed him to take extra when needed.   OPTIVOL: Advised transmission suggest fluid accumulation starting 04/07/2018 to 04/11/2018.    SYMPTOMS: He is unable to slip rings off his finger due to hand/finger swelling when he wakes up in the mornings. His weight fluctuates to 164-165 lbs when he has fluid and drops to 161 lbs after taking extra Furosemide 20 mg.    Furosemide 40 mg 1 tablet daily  Labs: 01/17/2018 Creatinine 1.24, BUN 13, Potassium 3.9, Sodium 140, eGFR 60.06 10/07/2017 Creatinine1.40, BUN18, Potassium4.5, OXBDZH299, MEQA83-41 09/30/2017 Creatinine1.33, BUN15, Potassium4.3, DQQIWL798, EGFR50-58  09/29/2017 Creatinine1.33, BUN15, Potassium3.9, Sodium136, EGFR50-58  09/28/2017 Creatinine1.32, BUN15, Potassium3.8, XQJJHE174, YCXK48-18  09/27/2017 Creatinine1.31, BUN9, Potassium 3.9, Sodium141, HUDJ49-70  09/22/2017 Creatinine1.50, BUN13, Potassium4.2, YOVZCH885, OYDX41-28  08/06/2017 Creatinine1.13, BUN8, Potassium 3.9, Sodium140, EGFR>60 07/27/2017 Creatinine1.01, BUN14, Potassium3.5, Sodium139 07/19/2017 Creatinine1.08, BUN10, Potassium4.2, NOMVEH209, OBSJ62.83   Advised will review with Dr Caryl Comes in the office and if any recommendations will call him back.  Patient has office appt with Dr Saunders Revel 05/09/2018   04/14/2018 Optivol

## 2018-04-17 LAB — CUP PACEART REMOTE DEVICE CHECK
Battery Voltage: 2.63 V
Brady Statistic RV Percent Paced: 0.72 %
Date Time Interrogation Session: 20191205062207
HighPow Impedance: 44 Ohm
HighPow Impedance: 58 Ohm
Implantable Lead Implant Date: 20041020
Implantable Lead Implant Date: 20100913
Implantable Lead Location: 753860
Implantable Lead Location: 753860
Implantable Lead Model: 5076
Implantable Lead Model: 6949
Implantable Pulse Generator Implant Date: 20100913
Lead Channel Impedance Value: 475 Ohm
Lead Channel Sensing Intrinsic Amplitude: 30.875 mV
Lead Channel Sensing Intrinsic Amplitude: 30.875 mV
Lead Channel Setting Pacing Amplitude: 3.5 V
Lead Channel Setting Pacing Pulse Width: 0.8 ms
Lead Channel Setting Sensing Sensitivity: 0.45 mV

## 2018-04-18 DIAGNOSIS — H353122 Nonexudative age-related macular degeneration, left eye, intermediate dry stage: Secondary | ICD-10-CM | POA: Diagnosis not present

## 2018-04-18 DIAGNOSIS — H353221 Exudative age-related macular degeneration, left eye, with active choroidal neovascularization: Secondary | ICD-10-CM | POA: Diagnosis not present

## 2018-04-18 DIAGNOSIS — H35722 Serous detachment of retinal pigment epithelium, left eye: Secondary | ICD-10-CM | POA: Diagnosis not present

## 2018-05-09 ENCOUNTER — Ambulatory Visit: Payer: Medicare Other | Admitting: Internal Medicine

## 2018-05-09 ENCOUNTER — Ambulatory Visit (INDEPENDENT_AMBULATORY_CARE_PROVIDER_SITE_OTHER): Payer: Medicare Other

## 2018-05-09 DIAGNOSIS — H353212 Exudative age-related macular degeneration, right eye, with inactive choroidal neovascularization: Secondary | ICD-10-CM | POA: Diagnosis not present

## 2018-05-09 DIAGNOSIS — H353211 Exudative age-related macular degeneration, right eye, with active choroidal neovascularization: Secondary | ICD-10-CM | POA: Diagnosis not present

## 2018-05-09 DIAGNOSIS — I5022 Chronic systolic (congestive) heart failure: Secondary | ICD-10-CM

## 2018-05-09 DIAGNOSIS — Z95810 Presence of automatic (implantable) cardiac defibrillator: Secondary | ICD-10-CM

## 2018-05-09 DIAGNOSIS — H353221 Exudative age-related macular degeneration, left eye, with active choroidal neovascularization: Secondary | ICD-10-CM | POA: Diagnosis not present

## 2018-05-09 DIAGNOSIS — H353122 Nonexudative age-related macular degeneration, left eye, intermediate dry stage: Secondary | ICD-10-CM | POA: Diagnosis not present

## 2018-05-09 NOTE — Progress Notes (Signed)
EPIC Encounter for ICM Monitoring  Patient Name: Mark Clayton is a 78 y.o. male Date: 05/09/2018 Primary Care Physican: [name], MD Cardiologist:End Electrophysiologist: Caryl Comes Last Weight:161lbs  Today's Weight: 162 lbs                                                            Heart Failure questions reviewed.  Pt he has had some dizziness and had to cancel appt with Dr End.   Report: Thoracic impedance normal.   Prescribed: Furosemide 40 mg 1 tablet daily.  Dr Caryl Comes advised patient can take extra Furosemide when needed (see 04/14/2018 phone note).   Labs: 01/17/2018 Creatinine 1.24, BUN 13, Potassium 3.9, Sodium 140, GFR 60.06 10/07/2017 Creatinine1.40, BUN18, Potassium4.5, VVOHYW737, GFR47-55 09/30/2017 Creatinine1.33, BUN15, Potassium4.3, TGGYIR485, GFR50-58  09/29/2017 Creatinine1.33, BUN15, Potassium3.9, Sodium136, GFR50-58  09/28/2017 Creatinine1.32, BUN15, Potassium3.8, IOEVOJ500, GFR51-59  09/27/2017 Creatinine1.31, BUN9, Potassium 3.9, Sodium141, GFR51-59  09/22/2017 Creatinine1.50, BUN13, Potassium4.2, XFGHWE993, ZJI96-78  08/06/2017 Creatinine1.13, BUN8, Potassium 3.9, Sodium140, GFR>60 07/27/2017 Creatinine1.01, BUN14, Potassium3.5, Sodium139 07/19/2017 Creatinine1.08, BUN10, Potassium4.2, Sodium141, LFY10.17  Acomplete set of results can be found in Results Review.  Recommendations: No changes. Encouraged to call for fluid symptoms.  Follow-up plan: ICM clinic phone appointment on3/16/2020.He will be rescheduling appt with Dr End.  Copy of ICM check sent to [place].    3 month ICM trend: 05/09/2018    1 Year ICM trend:       Rosalene Billings, RN 05/09/2018 7:42 AM

## 2018-05-20 ENCOUNTER — Ambulatory Visit (INDEPENDENT_AMBULATORY_CARE_PROVIDER_SITE_OTHER): Payer: Medicare Other | Admitting: Internal Medicine

## 2018-05-20 ENCOUNTER — Encounter: Payer: Self-pay | Admitting: Internal Medicine

## 2018-05-20 ENCOUNTER — Telehealth: Payer: Self-pay | Admitting: Internal Medicine

## 2018-05-20 DIAGNOSIS — H60393 Other infective otitis externa, bilateral: Secondary | ICD-10-CM | POA: Diagnosis not present

## 2018-05-20 DIAGNOSIS — H6090 Unspecified otitis externa, unspecified ear: Secondary | ICD-10-CM | POA: Insufficient documentation

## 2018-05-20 MED ORDER — NEOMYCIN-COLIST-HC-THONZONIUM 3.3-3-10-0.5 MG/ML OT SUSP: 3.0000 [drp] | Freq: Three times a day (TID) | OTIC | 0 refills | Status: DC

## 2018-05-20 NOTE — Assessment & Plan Note (Signed)
Rx for cortisporin ear drops and encouraged to take zyrtec plain (no d component due to heart conditions).

## 2018-05-20 NOTE — Telephone Encounter (Signed)
They had the ones for the eyes and they are going to fill that for patient to use in ears same disp and sig.

## 2018-05-20 NOTE — Telephone Encounter (Signed)
Copied from [place] [phone]. Topic: Quick Communication - Rx Refill/Question >> May 20, 2018  2:05 PM Margot Ables wrote: Medication: neomycin-colistin-hydrocortisone-thonzonium (CORTISPORIN-TC) 3.05-30-08-0.5 MG/ML OTIC suspension - out of stock and asking for substitution Preferred Pharmacy (with phone number or street name): [place], [place] - [place] [phone] (Phone)  [phone] (Fax)

## 2018-05-20 NOTE — Telephone Encounter (Signed)
Thank you :)

## 2018-05-20 NOTE — Patient Instructions (Addendum)
We have sent in ear drops to use 3 drops in each ear 3 times a day for 3 days.  It is safe to take zyrtec (cetirizine) or claritin (loratadine).

## 2018-05-20 NOTE — Telephone Encounter (Signed)
Did we ask what they had in stock which was similar or same? This also comes in eye formulation do they have that in stock?

## 2018-05-20 NOTE — Progress Notes (Signed)
   Subjective:   Patient ID: Mark Clayton, male    DOB: 11/19/1940, 78 y.o.   MRN: [id number]  HPI The patient is a 78 y.o. man coming in for cold symptoms. Started about 2 days ago. Main symptoms are: ears clogged and pressure. Denies fevers or chills, denies cough or SOB, denies chest pain. Overall it is stable since onset. Has tried nothing. Is having some hearing changes.   Review of Systems  Constitutional: Negative.   HENT: Positive for congestion, ear pain and hearing loss.   Eyes: Negative.   Respiratory: Negative for cough, chest tightness and shortness of breath.   Cardiovascular: Negative for chest pain, palpitations and leg swelling.  Gastrointestinal: Negative for abdominal distention, abdominal pain, constipation, diarrhea, nausea and vomiting.  Musculoskeletal: Negative.   Skin: Negative.   Neurological: Negative.   Psychiatric/Behavioral: Negative.     Objective:  Physical Exam Constitutional:      Appearance: He is well-developed.  HENT:     Head: Normocephalic and atraumatic.     Ears:     Comments: Otitis externa bilateral Neck:     Musculoskeletal: Normal range of motion.  Cardiovascular:     Rate and Rhythm: Normal rate and regular rhythm.  Pulmonary:     Effort: Pulmonary effort is normal. No respiratory distress.     Breath sounds: Normal breath sounds. No wheezing or rales.  Abdominal:     General: Bowel sounds are normal. There is no distension.     Palpations: Abdomen is soft.     Tenderness: There is no abdominal tenderness. There is no rebound.  Skin:    General: Skin is warm and dry.  Neurological:     Mental Status: He is alert and oriented to person, place, and time.     Coordination: Coordination normal.     Vitals:   05/20/18 1256  BP: 118/72  Pulse: (!) 52  Temp: 97.7 F (36.5 C)  TempSrc: Oral  SpO2: 99%  Weight: 166 lb (75.3 kg)  Height: 6' (1.829 m)    Assessment & Plan:

## 2018-06-02 ENCOUNTER — Ambulatory Visit (INDEPENDENT_AMBULATORY_CARE_PROVIDER_SITE_OTHER): Payer: Medicare Other | Admitting: *Deleted

## 2018-06-02 DIAGNOSIS — I255 Ischemic cardiomyopathy: Secondary | ICD-10-CM

## 2018-06-02 LAB — CUP PACEART REMOTE DEVICE CHECK
Battery Voltage: 2.62 V
Brady Statistic RV Percent Paced: 0.88 %
Date Time Interrogation Session: 20200305081807
HighPow Impedance: 41 Ohm
HighPow Impedance: 52 Ohm
Implantable Lead Implant Date: 20041020
Implantable Lead Implant Date: 20100913
Implantable Lead Location: 753860
Implantable Lead Location: 753860
Implantable Lead Model: 5076
Implantable Lead Model: 6949
Implantable Pulse Generator Implant Date: 20100913
Lead Channel Impedance Value: 475 Ohm
Lead Channel Sensing Intrinsic Amplitude: 31.625 mV
Lead Channel Sensing Intrinsic Amplitude: 31.625 mV
Lead Channel Setting Pacing Amplitude: 3.5 V
Lead Channel Setting Pacing Pulse Width: 0.8 ms
Lead Channel Setting Sensing Sensitivity: 0.45 mV

## 2018-06-10 ENCOUNTER — Encounter: Payer: Self-pay | Admitting: Cardiology

## 2018-06-10 NOTE — Progress Notes (Signed)
Remote ICD transmission.   

## 2018-06-13 ENCOUNTER — Ambulatory Visit (INDEPENDENT_AMBULATORY_CARE_PROVIDER_SITE_OTHER): Payer: Medicare Other

## 2018-06-13 DIAGNOSIS — I5022 Chronic systolic (congestive) heart failure: Secondary | ICD-10-CM

## 2018-06-13 DIAGNOSIS — Z95810 Presence of automatic (implantable) cardiac defibrillator: Secondary | ICD-10-CM | POA: Diagnosis not present

## 2018-06-13 DIAGNOSIS — H353211 Exudative age-related macular degeneration, right eye, with active choroidal neovascularization: Secondary | ICD-10-CM | POA: Diagnosis not present

## 2018-06-13 DIAGNOSIS — H353122 Nonexudative age-related macular degeneration, left eye, intermediate dry stage: Secondary | ICD-10-CM | POA: Diagnosis not present

## 2018-06-14 ENCOUNTER — Other Ambulatory Visit: Payer: Self-pay

## 2018-06-14 MED ORDER — PRAVASTATIN SODIUM 40 MG PO TABS: 40.0000 mg | ORAL_TABLET | Freq: Every day | ORAL | 0 refills | Status: DC

## 2018-06-14 NOTE — Progress Notes (Signed)
EPIC Encounter for ICM Monitoring  Patient Name: Mark Clayton is a 78 y.o. male Date: 06/14/2018 Primary Care Physican: [name], MD Cardiologist:End Electrophysiologist: Caryl Comes BaseWeight:161lbs Today's Weight:162lbs    Heart Failure questions reviewed.  Pt is asymptomatic.   Report: Thoracic impedanceabnormal suggesting fluid accumulation since 06/07/2018.   Prescribed:Furosemide 40 mg 1 tablet daily.  Dr Caryl Comes advised patient can take extra Furosemide when needed (see 04/14/2018 phone note).   Labs: 01/17/2018 Creatinine 1.24, BUN 13, Potassium 3.9, Sodium 140, GFR 60.06 10/07/2017 Creatinine1.40, BUN18, Potassium4.5, [id number], GFR47-55 09/30/2017 Creatinine1.33, BUN15, Potassium4.3, [id number], GFR50-58  09/29/2017 Creatinine1.33, BUN15, Potassium3.9, Sodium136, GFR50-58  09/28/2017 Creatinine1.32, BUN15, Potassium3.8, [id number], GFR51-59  09/27/2017 Creatinine1.31, BUN9, Potassium 3.9, Sodium141, GFR51-59  09/22/2017 Creatinine1.50, BUN13, Potassium4.2, [id number], LEX51-70  08/06/2017 Creatinine1.13, BUN8, Potassium 3.9, Sodium140, GFR>60 07/27/2017 Creatinine1.01, BUN14, Potassium3.5, Sodium139 07/19/2017 Creatinine1.08, BUN10, Potassium4.2, Sodium141, YFV49.44  Acomplete set of results can be found in Results Review.  Recommendations: Advised to take extra Furosemide tablet x 1 day and then return prescribed dosage.    Follow-up plan: ICM clinic phone appointment on3/25/2020 to recheck fluid levels.Needs to reschedule appt with Dr End.  Copy of ICM check sent to Dr.Klein and Dr End.   3 month ICM trend: 06/13/2018    1 Year ICM trend:       Rosalene Billings, RN 06/14/2018 7:50 AM

## 2018-06-15 ENCOUNTER — Other Ambulatory Visit (HOSPITAL_COMMUNITY): Payer: Self-pay | Admitting: *Deleted

## 2018-06-15 MED ORDER — DOFETILIDE 250 MCG PO CAPS: 250.0000 ug | ORAL_CAPSULE | Freq: Two times a day (BID) | ORAL | 2 refills | Status: DC

## 2018-06-20 DIAGNOSIS — H3562 Retinal hemorrhage, left eye: Secondary | ICD-10-CM | POA: Diagnosis not present

## 2018-06-20 DIAGNOSIS — H353221 Exudative age-related macular degeneration, left eye, with active choroidal neovascularization: Secondary | ICD-10-CM | POA: Diagnosis not present

## 2018-06-22 ENCOUNTER — Ambulatory Visit (INDEPENDENT_AMBULATORY_CARE_PROVIDER_SITE_OTHER): Payer: Medicare Other

## 2018-06-22 ENCOUNTER — Other Ambulatory Visit: Payer: Self-pay

## 2018-06-22 DIAGNOSIS — Z95810 Presence of automatic (implantable) cardiac defibrillator: Secondary | ICD-10-CM

## 2018-06-22 DIAGNOSIS — I5022 Chronic systolic (congestive) heart failure: Secondary | ICD-10-CM

## 2018-06-24 NOTE — Progress Notes (Signed)
EPIC Encounter for ICM Monitoring  Patient Name: BLANDON OFFERDAHL is a 78 y.o. male Date: 06/24/2018 Primary Care Physican: [name], MD Cardiologist:End Electrophysiologist: Caryl Comes BaseWeight:161lbs 06/24/2018 Weight:unknownn   Attempted call to patient and unable to reach.  Left detailed message per DPR regarding transmission. Transmission reviewed.   Report: Thoracic impedancereturned to normal after taking extra Furosemide.   Prescribed:Furosemide 40 mg 1 tablet daily. Dr Caryl Comes advised patient can take extra Furosemide when needed (see 04/14/2018 phone note).   Labs: 01/17/2018 Creatinine 1.24, BUN 13, Potassium 3.9, Sodium 140, GFR 60.06 10/07/2017 Creatinine1.40, BUN18, Potassium4.5, [id number], GFR47-55 09/30/2017 Creatinine1.33, BUN15, Potassium4.3, BSJGGE366, GFR50-58  09/29/2017 Creatinine1.33, BUN15, Potassium3.9, Sodium136, GFR50-58  09/28/2017 Creatinine1.32, BUN15, Potassium3.8, [id number], GFR51-59  09/27/2017 Creatinine1.31, BUN9, Potassium 3.9, Sodium141, GFR51-59  09/22/2017 Creatinine1.50, BUN13, Potassium4.2, [id number], EXN17-00  08/06/2017 Creatinine1.13, BUN8, Potassium 3.9, Sodium140, GFR>60 07/27/2017 Creatinine1.01, BUN14, Potassium3.5, Sodium139 07/19/2017 Creatinine1.08, BUN10, Potassium4.2, Sodium141, FVC94.49  Acomplete set of results can be found in Results Review.  Recommendations:Left voice mail with ICM number and encouraged to call if experiencing any fluid symptoms.    Follow-up plan: ICM clinic phone appointment on4/20/2020 to recheck fluid levels.  Copy of ICM check sent to [place].  3 month ICM trend: 06/22/2018    1 Year ICM trend:       Rosalene Billings, RN 06/24/2018 1:52 PM

## 2018-07-18 ENCOUNTER — Other Ambulatory Visit: Payer: Self-pay

## 2018-07-18 ENCOUNTER — Ambulatory Visit: Payer: Medicare Other | Admitting: Internal Medicine

## 2018-07-18 ENCOUNTER — Ambulatory Visit (INDEPENDENT_AMBULATORY_CARE_PROVIDER_SITE_OTHER): Payer: Medicare Other

## 2018-07-18 DIAGNOSIS — H35361 Drusen (degenerative) of macula, right eye: Secondary | ICD-10-CM | POA: Diagnosis not present

## 2018-07-18 DIAGNOSIS — I5022 Chronic systolic (congestive) heart failure: Secondary | ICD-10-CM | POA: Diagnosis not present

## 2018-07-18 DIAGNOSIS — H353211 Exudative age-related macular degeneration, right eye, with active choroidal neovascularization: Secondary | ICD-10-CM | POA: Diagnosis not present

## 2018-07-18 DIAGNOSIS — Z95810 Presence of automatic (implantable) cardiac defibrillator: Secondary | ICD-10-CM

## 2018-07-18 DIAGNOSIS — H353112 Nonexudative age-related macular degeneration, right eye, intermediate dry stage: Secondary | ICD-10-CM | POA: Diagnosis not present

## 2018-07-18 NOTE — Progress Notes (Signed)
EPIC Encounter for ICM Monitoring  Patient Name: Mark Clayton is a 78 y.o. male Date: 07/18/2018 Primary Care Physican: [name], MD Cardiologist:End Electrophysiologist: Caryl Comes BaseWeight:161lbs 07/18/2018 Weight: 162.4 lbs   Heart failure questions and patient asymptomatic.  Report: Thoracic impedancenormal.   Prescribed:Furosemide 40 mg 1 tablet daily. Dr Caryl Comes advised patient can take extra Furosemide when needed (see 04/14/2018 phone note).   Labs: 01/17/2018 Creatinine 1.24, BUN 13, Potassium 3.9, Sodium 140, GFR 60.06 10/07/2017 Creatinine1.40, BUN18, Potassium4.5, [id number], GFR47-55 09/30/2017 Creatinine1.33, BUN15, Potassium4.3, [id number], GFR50-58  09/29/2017 Creatinine1.33, BUN15, Potassium3.9, Sodium136, GFR50-58  09/28/2017 Creatinine1.32, BUN15, Potassium3.8, [id number], GFR51-59  09/27/2017 Creatinine1.31, BUN9, Potassium 3.9, Sodium141, GFR51-59  09/22/2017 Creatinine1.50, BUN13, Potassium4.2, CVELFY101, BPZ02-58  08/06/2017 Creatinine1.13, BUN8, Potassium 3.9, Sodium140, GFR>60 07/27/2017 Creatinine1.01, BUN14, Potassium3.5, Sodium139 07/19/2017 Creatinine1.08, BUN10, Potassium4.2, Sodium141, NID78.24  Acomplete set of results can be found in Results Review.  Recommendations: No changes and encouraged to call for any fluid symptoms.   Follow-up plan: ICM clinic phone appointment 08/19/2018.  Copy of ICM check sent to [place].   3 month ICM trend: 07/18/2018    1 Year ICM trend:       Rosalene Billings, RN 07/18/2018 2:16 PM

## 2018-07-22 ENCOUNTER — Ambulatory Visit: Payer: Medicare Other | Admitting: Internal Medicine

## 2018-08-08 ENCOUNTER — Other Ambulatory Visit: Payer: Self-pay | Admitting: Internal Medicine

## 2018-08-15 ENCOUNTER — Other Ambulatory Visit: Payer: Self-pay | Admitting: Internal Medicine

## 2018-08-15 DIAGNOSIS — H3562 Retinal hemorrhage, left eye: Secondary | ICD-10-CM | POA: Diagnosis not present

## 2018-08-15 DIAGNOSIS — H353221 Exudative age-related macular degeneration, left eye, with active choroidal neovascularization: Secondary | ICD-10-CM | POA: Diagnosis not present

## 2018-08-19 ENCOUNTER — Ambulatory Visit (INDEPENDENT_AMBULATORY_CARE_PROVIDER_SITE_OTHER): Payer: Medicare Other

## 2018-08-19 ENCOUNTER — Other Ambulatory Visit: Payer: Self-pay

## 2018-08-19 DIAGNOSIS — Z95810 Presence of automatic (implantable) cardiac defibrillator: Secondary | ICD-10-CM

## 2018-08-19 DIAGNOSIS — I5022 Chronic systolic (congestive) heart failure: Secondary | ICD-10-CM | POA: Diagnosis not present

## 2018-08-19 NOTE — Progress Notes (Signed)
EPIC Encounter for ICM Monitoring  Patient Name: Mark Clayton is a 78 y.o. male Date: 08/19/2018 Primary Care Physican: [name], MD Cardiologist:End Electrophysiologist: Caryl Comes BaseWeight:161lbs 07/18/2018 Weight: 162.4 lbs 08/19/2018 Weight: 162.1 lbs   Heart failure questions and patient asymptomatic today but did take extra Furosemide a couple of days ago because weight went up 3 lbs.  Weight is back baseline.  Optivol Thoracic impedancenormal.  Prescribed:Furosemide 40 mg 1 tablet daily. Dr Caryl Comes advised patient can take extra Furosemide when needed (see 04/14/2018 phone note).   Labs: 01/17/2018 Creatinine 1.24, BUN 13, Potassium 3.9, Sodium 140, GFR 60.06 10/07/2017 Creatinine1.40, BUN18, Potassium4.5, [id number], GFR47-55 09/30/2017 Creatinine1.33, BUN15, Potassium4.3, [id number], GFR50-58  09/29/2017 Creatinine1.33, BUN15, Potassium3.9, Sodium136, GFR50-58  09/28/2017 Creatinine1.32, BUN15, Potassium3.8, [id number], GFR51-59  09/27/2017 Creatinine1.31, BUN9, Potassium 3.9, Sodium141, GFR51-59  09/22/2017 Creatinine1.50, BUN13, Potassium4.2, [id number], XYD28-97  08/06/2017 Creatinine1.13, BUN8, Potassium 3.9, Sodium140, GFR>60 07/27/2017 Creatinine1.01, BUN14, Potassium3.5, Sodium139 07/19/2017 Creatinine1.08, BUN10, Potassium4.2, Sodium141, VNR04.13  Acomplete set of results can be found in Results Review.  Recommendations: No changes and encouraged to call for any fluid symptoms.   Follow-up plan: ICM clinic phone appointment 09/19/2018.  Copy of ICM check sent to [place].    3 month ICM trend: 08/19/2018    1 Year ICM trend:       Rosalene Billings, RN 08/19/2018 4:42 PM

## 2018-08-26 ENCOUNTER — Other Ambulatory Visit (INDEPENDENT_AMBULATORY_CARE_PROVIDER_SITE_OTHER): Payer: Medicare Other

## 2018-08-26 ENCOUNTER — Other Ambulatory Visit: Payer: Self-pay

## 2018-08-26 ENCOUNTER — Ambulatory Visit (INDEPENDENT_AMBULATORY_CARE_PROVIDER_SITE_OTHER): Payer: Medicare Other | Admitting: Internal Medicine

## 2018-08-26 ENCOUNTER — Encounter: Payer: Self-pay | Admitting: Internal Medicine

## 2018-08-26 VITALS — BP 120/60 | HR 54 | Temp 97.7°F | Ht 72.0 in | Wt 166.0 lb

## 2018-08-26 DIAGNOSIS — I5022 Chronic systolic (congestive) heart failure: Secondary | ICD-10-CM

## 2018-08-26 DIAGNOSIS — H9319 Tinnitus, unspecified ear: Secondary | ICD-10-CM | POA: Diagnosis not present

## 2018-08-26 DIAGNOSIS — I10 Essential (primary) hypertension: Secondary | ICD-10-CM

## 2018-08-26 LAB — COMPREHENSIVE METABOLIC PANEL
ALT: 7 U/L (ref 0–53)
AST: 12 U/L (ref 0–37)
Albumin: 3.9 g/dL (ref 3.5–5.2)
Alkaline Phosphatase: 46 U/L (ref 39–117)
BUN: 16 mg/dL (ref 6–23)
CO2: 28 mEq/L (ref 19–32)
Calcium: 9.4 mg/dL (ref 8.4–10.5)
Chloride: 104 mEq/L (ref 96–112)
Creatinine, Ser: 1.27 mg/dL (ref 0.40–1.50)
GFR: 54.89 mL/min — ABNORMAL LOW (ref >60.00)
Glucose, Bld: 154 mg/dL — ABNORMAL HIGH (ref 70–99)
Potassium: 4.2 mEq/L (ref 3.5–5.1)
Sodium: 140 mEq/L (ref 135–145)
Total Bilirubin: 0.8 mg/dL (ref 0.2–1.2)
Total Protein: 6.6 g/dL (ref 6.0–8.3)

## 2018-08-26 LAB — CBC
HCT: 36 % — ABNORMAL LOW (ref 39.0–52.0)
Hemoglobin: 12.1 g/dL — ABNORMAL LOW (ref 13.0–17.0)
MCHC: 33.6 g/dL (ref 30.0–36.0)
MCV: 86.4 fl (ref 78.0–100.0)
Platelets: 157 10*3/uL (ref 150.0–400.0)
RBC: 4.17 Mil/uL — ABNORMAL LOW (ref 4.22–5.81)
RDW: 14.4 % (ref 11.5–15.5)
WBC: 4.2 10*3/uL (ref 4.0–10.5)

## 2018-08-26 LAB — MAGNESIUM: Magnesium: 2.1 mg/dL (ref 1.5–2.5)

## 2018-08-26 LAB — BRAIN NATRIURETIC PEPTIDE: Pro B Natriuretic peptide (BNP): 382 pg/mL — ABNORMAL HIGH (ref 0.0–100.0)

## 2018-08-26 LAB — TSH: TSH: 4.88 u[IU]/mL — ABNORMAL HIGH (ref 0.35–4.50)

## 2018-08-26 NOTE — Progress Notes (Signed)
   Subjective:   Patient ID: Mark Clayton, male    DOB: 1940-06-12, 77 y.o.   MRN: [id number]  HPI The patient is a 78 YO man coming in for follow up blood pressure (taking lasix, losartan, coreg, imdur, denies headaches or chest pains), and heart failure (monitoring weight daily, sees cardiology for this, denies change recently, weights are stable, monitoring diet, does yardwork without chest pains), and some new tinnitus (getting a sound like motor in his ears some, does not disrupt sleep, has concurrent hearing loss and aids, denies that this disrupts his life at this time).   Review of Systems  Constitutional: Negative.   HENT: Positive for tinnitus.   Eyes: Negative.   Respiratory: Positive for shortness of breath. Negative for cough and chest tightness.        Rare, stable  Cardiovascular: Negative for chest pain, palpitations and leg swelling.  Gastrointestinal: Negative for abdominal distention, abdominal pain, constipation, diarrhea, nausea and vomiting.  Musculoskeletal: Negative.   Skin: Negative.   Neurological: Negative.   Psychiatric/Behavioral: Negative.     Objective:  Physical Exam Constitutional:      Appearance: He is well-developed.  HENT:     Head: Normocephalic and atraumatic.     Right Ear: Tympanic membrane, ear canal and external ear normal. There is no impacted cerumen.     Left Ear: Tympanic membrane, ear canal and external ear normal. There is no impacted cerumen.  Neck:     Musculoskeletal: Normal range of motion.  Cardiovascular:     Comments: Slightly slow rate Pulmonary:     Effort: Pulmonary effort is normal. No respiratory distress.     Breath sounds: Normal breath sounds. No wheezing or rales.  Abdominal:     General: Bowel sounds are normal. There is no distension.     Palpations: Abdomen is soft.     Tenderness: There is no abdominal tenderness. There is no rebound.  Skin:    General: Skin is warm and dry.  Neurological:     Mental  Status: He is alert and oriented to person, place, and time.     Coordination: Coordination normal.     Vitals:   08/26/18 0831  BP: 120/60  Pulse: (!) 54  Temp: 97.7 F (36.5 C)  TempSrc: Oral  SpO2: 94%  Weight: 166 lb (75.3 kg)  Height: 6' (1.829 m)    Assessment & Plan:

## 2018-08-26 NOTE — Assessment & Plan Note (Signed)
BP at goal today, continue losartan and lasix and imdur and carvedilol. HR okay. Checking CMP and adjust as needed.

## 2018-08-26 NOTE — Patient Instructions (Signed)
We are checking the labs today.  

## 2018-08-26 NOTE — Assessment & Plan Note (Signed)
Offered referral to audiology to check hearing. We talked about options for distraction if needed. Advised about prognosis etc.

## 2018-08-26 NOTE — Assessment & Plan Note (Signed)
Checking BNP, magnesium, CMP, CBC, TSH today to evaluate. Weight is stable and he is very diligent about monitoring weight and intake.

## 2018-08-29 DIAGNOSIS — H353112 Nonexudative age-related macular degeneration, right eye, intermediate dry stage: Secondary | ICD-10-CM | POA: Diagnosis not present

## 2018-08-29 DIAGNOSIS — H353211 Exudative age-related macular degeneration, right eye, with active choroidal neovascularization: Secondary | ICD-10-CM | POA: Diagnosis not present

## 2018-09-01 ENCOUNTER — Ambulatory Visit (INDEPENDENT_AMBULATORY_CARE_PROVIDER_SITE_OTHER): Payer: Medicare Other | Admitting: *Deleted

## 2018-09-01 DIAGNOSIS — I5022 Chronic systolic (congestive) heart failure: Secondary | ICD-10-CM

## 2018-09-01 DIAGNOSIS — I255 Ischemic cardiomyopathy: Secondary | ICD-10-CM

## 2018-09-01 LAB — CUP PACEART REMOTE DEVICE CHECK
Battery Voltage: 2.61 V
Brady Statistic RV Percent Paced: 1.54 %
Date Time Interrogation Session: 20200604084227
HighPow Impedance: 40 Ohm
HighPow Impedance: 52 Ohm
Implantable Lead Implant Date: 20041020
Implantable Lead Implant Date: 20100913
Implantable Lead Location: 753860
Implantable Lead Location: 753860
Implantable Lead Model: 5076
Implantable Lead Model: 6949
Implantable Pulse Generator Implant Date: 20100913
Lead Channel Impedance Value: 437 Ohm
Lead Channel Sensing Intrinsic Amplitude: 31.625 mV
Lead Channel Setting Pacing Amplitude: 3.5 V
Lead Channel Setting Pacing Pulse Width: 0.8 ms
Lead Channel Setting Sensing Sensitivity: 0.45 mV

## 2018-09-02 ENCOUNTER — Encounter: Payer: Self-pay | Admitting: Nurse Practitioner

## 2018-09-02 NOTE — Telephone Encounter (Signed)
This encounter was created in error - please disregard.

## 2018-09-05 ENCOUNTER — Telehealth: Payer: Self-pay | Admitting: Student

## 2018-09-05 NOTE — Telephone Encounter (Signed)
Device ERI as of 09/01/2018 (Battery 2.61 V / RRT= 2.63V). Will need scheduling to discuss procedure for gen change.  Thanks!   Legrand Como [address]" [place], PA-C 09/05/2018 9:48 AM

## 2018-09-08 NOTE — Progress Notes (Signed)
Remote ICD transmission.   

## 2018-09-08 NOTE — Telephone Encounter (Signed)
We should set this up for the office as it is not an easy discussion for a 78 yo man undergoing a 3rd procedure for primary prevention which has never gone off  Thanks

## 2018-09-16 ENCOUNTER — Encounter: Payer: Self-pay | Admitting: Internal Medicine

## 2018-09-16 ENCOUNTER — Telehealth (INDEPENDENT_AMBULATORY_CARE_PROVIDER_SITE_OTHER): Payer: Medicare Other | Admitting: Internal Medicine

## 2018-09-16 ENCOUNTER — Other Ambulatory Visit: Payer: Self-pay

## 2018-09-16 VITALS — BP 130/59 | HR 59 | Ht 72.0 in | Wt 164.0 lb

## 2018-09-16 DIAGNOSIS — I255 Ischemic cardiomyopathy: Secondary | ICD-10-CM

## 2018-09-16 DIAGNOSIS — I5022 Chronic systolic (congestive) heart failure: Secondary | ICD-10-CM

## 2018-09-16 DIAGNOSIS — Z7189 Other specified counseling: Secondary | ICD-10-CM | POA: Diagnosis not present

## 2018-09-16 DIAGNOSIS — I4819 Other persistent atrial fibrillation: Secondary | ICD-10-CM

## 2018-09-16 DIAGNOSIS — Z95810 Presence of automatic (implantable) cardiac defibrillator: Secondary | ICD-10-CM

## 2018-09-16 NOTE — Patient Instructions (Signed)
Spoke with pt regarding his appt. He had no additional questions. He understands he is to have a GXT performed prior to his generator change out. He will await a call from scheduling when this test starts to be performed again in the office.

## 2018-09-16 NOTE — Addendum Note (Signed)
Addended by: Dollene Primrose on: 09/16/2018 01:23 PM   Modules accepted: Orders

## 2018-09-16 NOTE — Progress Notes (Addendum)
Electrophysiology TeleHealth Note   Due to national recommendations of social distancing due to COVID 19, an audio/video telehealth visit is felt to be most appropriate for this patient at this time.  See MyChart message from today for the patient's consent to telehealth for Mark Clayton.   Date:  09/16/2018   ID:  Mark Clayton, DOB [birth year], MRN [id number]  Location: patient's home  Provider location: [address] [place]  Evaluation Performed: Follow-up visit  PCP:  [name], MD  Cardiologist:   CE Electrophysiologist:  SK   Chief Complaint:  Ischemic cardiomyopathy  History of Present Illness:    Mark Clayton is a 78 y.o. male who presents via audio/video conferencing for a telehealth visit today.  Since last being seen in our clinic, the patient reports doing quite well, major problem has been fluid accumlation with SOB and has been closely managed by ICM clinic  History of coronary artery disease with bypass surgery and interval stenting.  Ejection fraction 6/19 20-25%  Some Chest tightness, responsive to NTG, triggered by effort and bending, stable   Date Procedure Brand-Generator Comments  10/04* ICD single Medtronic  6949  2010 Gen Change 5076 V lead Medtronic R/S lead for 6949   Date Cr K TSH Hgb  5/20 1.27 4.2 4.88 12.1            The patient denies symptoms of fevers, chills, cough, or new SOB worrisome for COVID 19.    Past Medical History:  Diagnosis Date  . AICD (automatic cardioverter/defibrillator) present 01/17/2003   Medtronic Maximo 7232CX ICD, serial [id number]  . Anemia 02-06-11   takes oral iron  . Arthritis    hands, knees  . CAD (coronary artery disease) 2003   a. h/o MI and CABG in 2003. b. s/p DES to SVG-RPDA-RPLB in 08/2014.  Marland Kitchen Cancer of sigmoid colon ([facility]) 2012   a. s/p colon surgery.  . Carotid bruit   . Chronic systolic CHF (congestive heart failure) (HCC)    a. EF 20% in 2014.  Marland Kitchen Cough   .  GERD (gastroesophageal reflux disease) 02-06-11  . HTN (hypertension)   . Hyperlipidemia   . Ischemic cardiomyopathy    a. EF 20% in 2014. (Master study EF >20%)  . LV (left ventricular) mural thrombus    a. Last echo 12/2012 EF 20% with mural thrombus, mod-severe left atrial enlargement. Per Dr. Olin Pia notes from that time, echo in 2009 demonstrated something similar - he had been on warfarin following prior MI and this was stopped after a number of months. Given lack of data regarding anticoagulation from chronic clot, he has not been on anticoagulation since.  . Macular degeneration   . Myocardial infarct [facility])    2003    Past Surgical History:  Procedure Laterality Date  . CARDIAC CATHETERIZATION N/A 09/20/2014   Procedure: Left Heart Cath and Coronary Angiography;  Surgeon: Jettie Booze, MD; LAD 95%, D1 100%, CFX liner percent, OM 200%, OM 390%, RCA 90%, LIMA-LAD okay, SVG-OM 2-OM 3 minimal disease, SVG-RPDA-RPLB 100% between the RPDA and RPL     . CARDIAC CATHETERIZATION N/A 09/20/2014   Procedure: Coronary Stent Intervention;  Surgeon: Jettie Booze, MD; Synergy DES 4 x 24 mm reducing the stenosis to 5%   . CARDIAC DEFIBRILLATOR PLACEMENT  01/17/03   6949 lead. Medtronic. remote-no; with later revision  . CARDIOVERSION N/A 05/22/2016   Procedure: CARDIOVERSION;  Surgeon: Lelon Perla, MD;  Location: [facility];  Service: Cardiovascular;  Laterality: N/A;  . CARDIOVERSION N/A 08/10/2017   Procedure: CARDIOVERSION;  Surgeon: Pixie Casino, MD;  Location: [facility];  Service: Cardiovascular;  Laterality: N/A;  . CATARACT EXTRACTION W/ INTRAOCULAR LENS  IMPLANT, BILATERAL Bilateral June/-July 2009   Dr. Katy Fitch  . COLON RESECTION  02/09/2011   Procedure: LAPAROSCOPIC SIGMOID COLON RESECTION;  Surgeon: Pedro Earls, MD;  Location: [facility];  Service: General;  Laterality: N/A;  Laparoscopic Assisted Sigmoid Colectomy  . COLON SURGERY    . COLONOSCOPY  08/31/2011    Procedure: COLONOSCOPY;  Surgeon: Jerene Bears, MD;  Location: [facility];  Service: Gastroenterology;  Laterality: N/A;  . COLONOSCOPY N/A 09/05/2012   Procedure: COLONOSCOPY;  Surgeon: Jerene Bears, MD;  Location: [facility];  Service: Gastroenterology;  Laterality: N/A;  . COLONOSCOPY N/A 04/18/2013   Procedure: COLONOSCOPY;  Surgeon: Jerene Bears, MD;  Location: [facility];  Service: Gastroenterology;  Laterality: N/A;  . COLONOSCOPY N/A 04/09/2014   Procedure: COLONOSCOPY;  Surgeon: Jerene Bears, MD;  Location: [facility];  Service: Gastroenterology;  Laterality: N/A;  . COLONOSCOPY WITH PROPOFOL N/A 05/03/2017   Procedure: COLONOSCOPY WITH PROPOFOL;  Surgeon: Jerene Bears, MD;  Location: [facility];  Service: Gastroenterology;  Laterality: N/A;  . CORONARY ANGIOPLASTY    . CORONARY ARTERY BYPASS GRAFT  01/2002   LIMA-LAD, SVG-OM 2-OM 3, SVG-RPDA-RPLB  . ICD GENERATOR CHANGE  2010   Medtronic Virtuoso II VR ICD  . INGUINAL HERNIA REPAIR Right 2000's X 2  . LAPAROSCOPIC RIGHT HEMI COLECTOMY N/A 11/04/2012   Procedure: LAPAROSCOPIC RIGHT HEMI COLECTOMY;  Surgeon: Pedro Earls, MD;  Location: [facility];  Service: General;  Laterality: N/A;  . TONSILLECTOMY  ~ 1950    Current Outpatient Medications  Medication Sig Dispense Refill  . carvedilol (COREG) 12.5 MG tablet TAKE 1 TABLET BY MOUTH TWO  TIMES DAILY WITH A MEAL 60 tablet 0  . dofetilide (TIKOSYN) 250 MCG capsule Take 1 capsule (250 mcg total) by mouth 2 (two) times daily. 180 capsule 2  . furosemide (LASIX) 40 MG tablet TAKE 1 TABLET BY MOUTH  DAILY 90 tablet 1  . isosorbide mononitrate (IMDUR) 30 MG 24 hr tablet TAKE 1 TABLET BY MOUTH  DAILY 90 tablet 3  . losartan (COZAAR) 50 MG tablet TAKE 1 TABLET BY MOUTH  DAILY 90 tablet 1  . meclizine (ANTIVERT) 25 MG tablet TAKE 1 TABLET BY MOUTH  DAILY AS NEEDED FOR  DIZZINESS. 90 tablet 0  . nitroGLYCERIN (NITROSTAT) 0.4 MG SL tablet DISSOLVE 1 TABLET UNDER THE TONGUE EVERY 5 MINUTES  AS  NEEDED FOR CHEST PAIN UP TO 3 DOSES. CALL 911 IF CHEST  PAIN PERSISTS 75 tablet 3  . pantoprazole (PROTONIX) 40 MG tablet TAKE 1 TABLET BY MOUTH  DAILY 90 tablet 1  . pravastatin (PRAVACHOL) 40 MG tablet TAKE 1 TABLET BY MOUTH AT  BEDTIME 90 tablet 0  . vitamin B-12 (CYANOCOBALAMIN) 100 MCG tablet Take 100 mcg by mouth every morning.    Marland Kitchen VITAMIN K PO Take 1 tablet by mouth daily.    Alveda Reasons 20 MG TABS tablet TAKE 1 TABLET BY MOUTH  DAILY WITH SUPPER 90 tablet 2   No current facility-administered medications for this visit.     Allergies:   Latex and Tape   Social History:  The patient  reports that he has never smoked. He has never used smokeless tobacco. He reports current alcohol use of  about 2.0 standard drinks of alcohol per week. He reports that he does not use drugs.   Family History:  The patient's   family history includes Alzheimer's disease in his mother; Heart disease in his father; Hyperlipidemia in his father and sister; Hypertension in his father and sister; Prostate cancer in his father.   ROS:  Please see the history of present illness.   All other systems are personally reviewed and negative.    Exam:    Vital Signs:  BP (!) 130/59   Pulse (!) 59   Ht 6' (1.829 m)   Wt 164 lb (74.4 kg)   SpO2 98%   BMI 22.24 kg/m     Well appearing, alert and conversant, regular work of breathing,  good skin color Eyes- anicteric, neuro- grossly intact, skin- no apparent rash or lesions or cyanosis, mouth- oral mucosa is pink   Labs/Other Tests and Data Reviewed:    Recent Labs: 08/26/2018: ALT 7; BUN 16; Creatinine, Ser 1.27; Hemoglobin 12.1; Magnesium 2.1; Platelets 157.0; Potassium 4.2; Pro B Natriuretic peptide (BNP) 382.0; Sodium 140; TSH 4.88   Wt Readings from Last 3 Encounters:  09/16/18 164 lb (74.4 kg)  08/26/18 166 lb (75.3 kg)  05/20/18 166 lb (75.3 kg)     Other studies personally reviewed: Additional studies/ records that were reviewed today include As  above     **  Last device remote is reviewed from [facility] PDF dated 6/20 which reveals normal device function,   arrhythmias - none-- device approaching ERI   ASSESSMENT & PLAN:    Ischemic cardiomyopathy  Implantable defibrillator-Medtronic-single chamber .   6949-lead   Hypertension   Sinus bradycardia  Atrial fibrillation persistent  Congestive heart failure-chronic-systolic  PVCs  Anemia-chronic    The patient has bradycardia and persistent left ventricular dysfunction.  We had a lengthy conversation regarding end-of-life decision-making and the role of ICD implantation and reimplantation in a septuagenarian.   Notably do we have the risk of infection with multiple procedures, but he has residual high-voltage therapies lead to a 6949-lead which remains at risk for fracture.  Hence, in the event that we are going to replace his device Replacing the 6949 is also appropriate--based on the Replace 2 trial the risk of a complication with his procedure would be estimated to be in the double digits.  Notwithstanding, the patient would like to proceed with device generator replacement.  I will assess heart rate excursion with treadmill testing as  based on cardiac Compass it appears quite limited.  If he had chronotropic incompetence, would probably favor putting in a fresh implant on the right side and simply abandoning the current defibrillator on the left  He is also having more angina.  Will discuss with Dr. Bethann Humble re-medical therapy versus diagnostic testing  More than 50% of 40 min was spent in counseling related to the above  He also seems to have low-grade hypothyroidism.  Will defer the management to his PCP  His device is almost at [facility].  I told him we would anticipate generator replacement 8-9/20  COVID 19 screen The patient denies symptoms of COVID 19 at this time.  The importance of social distancing was discussed today.  Follow-up:  Post gen change Next  remote: As Scheduled   Current medicines are reviewed at length with the patient today.   The patient does not have concerns regarding his medicines.  The following changes were made today:  none  Labs/ tests ordered today include:  Will need GXT prior to generator replacement  No orders of the defined types were placed in this encounter.   Future tests ( post COVID )    Patient Risk:  after full review of this patients clinical status, I feel that they are at moderate risk at this time.  Today, I have spent 29* minutes with the patient with telehealth technology discussing the above.  More than 50% of 42 min was spent in counseling related to the above   Signed, Virl Axe, MD  09/16/2018 10:41 AM     [facility] [address] [place] [address] [phone] (office) [phone] (fax)

## 2018-09-19 ENCOUNTER — Ambulatory Visit (INDEPENDENT_AMBULATORY_CARE_PROVIDER_SITE_OTHER): Payer: Medicare Other

## 2018-09-19 DIAGNOSIS — Z95810 Presence of automatic (implantable) cardiac defibrillator: Secondary | ICD-10-CM

## 2018-09-19 DIAGNOSIS — I5022 Chronic systolic (congestive) heart failure: Secondary | ICD-10-CM | POA: Diagnosis not present

## 2018-09-21 NOTE — Progress Notes (Signed)
EPIC Encounter for ICM Monitoring  Patient Name: Mark Clayton is a 78 y.o. male Date: 09/21/2018 Primary Care Physican: [name], MD Primary Care Physican: [name], MD Cardiologist:End Electrophysiologist: Caryl Comes BaseWeight:161lbs 07/18/2018 Weight:162.4 lbs 08/19/2018 Weight: 162.1 lbs    Heart failure questions and patient had swelling in fingers which resolved with extra Furosemide.    Optivol Thoracic impedanceabnormal since 09/15/2018 but he took extra Furosemide in the last 2 days  Prescribed:Furosemide 40 mg 1 tablet daily. Dr Caryl Comes advised patient can take extra Furosemide when needed (see 04/14/2018 phone note).   Labs: 08/26/2018 Creatinine 1.27, BUN 16, Potassium 4.2, Sodium 140, GFR 54.89 01/17/2018 Creatinine 1.24, BUN 13, Potassium 3.9, Sodium 140, GFR 60.06 Acomplete set of results can be found in Results Review.  Recommendations: No changes and encouraged to call for any fluid symptoms.  Follow-up plan: ICM clinic phone appointment7/27/2020.  Copy of ICM check sent to [place].   3 month ICM trend: 09/19/2018    1 Year ICM trend:      Rosalene Billings, RN 09/21/2018 9:08 AM

## 2018-09-28 ENCOUNTER — Telehealth: Payer: Self-pay | Admitting: Internal Medicine

## 2018-09-28 NOTE — Telephone Encounter (Signed)
Discussed with patient. No alerts. While on the phone, the alarm went off again. It is his homehealth tele- monitoring equipment, and not related to his device. He is aware of ERI, previously discussed and he is being scheduled for follow up/gen change.    Mark Clayton [address]" [place], PA-C 09/28/2018 1:00 PM

## 2018-09-28 NOTE — Telephone Encounter (Signed)
°  1. Has your device fired? no  2. Is you device beeping? yes  3. Are you experiencing draining or swelling at device site? no  4. Are you calling to see if we received your device transmission? no  5. Have you passed out? no    Please route to [place]

## 2018-09-28 NOTE — Telephone Encounter (Signed)
Patient reports he heard noise from ICD. No CP or  SOB. Patient reports he has had no shock. Pt to send remote transmission to access device.

## 2018-09-30 ENCOUNTER — Other Ambulatory Visit (HOSPITAL_COMMUNITY): Payer: Self-pay | Admitting: Nurse Practitioner

## 2018-09-30 ENCOUNTER — Other Ambulatory Visit: Payer: Self-pay | Admitting: Internal Medicine

## 2018-10-10 DIAGNOSIS — H353211 Exudative age-related macular degeneration, right eye, with active choroidal neovascularization: Secondary | ICD-10-CM | POA: Diagnosis not present

## 2018-10-10 DIAGNOSIS — H353221 Exudative age-related macular degeneration, left eye, with active choroidal neovascularization: Secondary | ICD-10-CM | POA: Diagnosis not present

## 2018-10-24 ENCOUNTER — Ambulatory Visit (INDEPENDENT_AMBULATORY_CARE_PROVIDER_SITE_OTHER): Payer: Medicare Other

## 2018-10-24 DIAGNOSIS — I5022 Chronic systolic (congestive) heart failure: Secondary | ICD-10-CM | POA: Diagnosis not present

## 2018-10-24 DIAGNOSIS — Z95810 Presence of automatic (implantable) cardiac defibrillator: Secondary | ICD-10-CM | POA: Diagnosis not present

## 2018-10-24 DIAGNOSIS — H353122 Nonexudative age-related macular degeneration, left eye, intermediate dry stage: Secondary | ICD-10-CM | POA: Diagnosis not present

## 2018-10-24 DIAGNOSIS — H353221 Exudative age-related macular degeneration, left eye, with active choroidal neovascularization: Secondary | ICD-10-CM | POA: Diagnosis not present

## 2018-10-25 NOTE — Progress Notes (Signed)
EPIC Encounter for ICM Monitoring  Patient Name: Mark Clayton is a 78 y.o. male Date: 10/25/2018 Primary Care Physican: [name], MD Cardiologist:End Electrophysiologist: Caryl Comes BaseWeight:161lbs 08/19/2018 Weight: 162.1 lbs 10/24/2018 Weight: 161 - 162 lbs       Heart failure questions reviewed and patient does not have any fluid symptoms.   Eye blood vessel busted and blinded in one eye.  He is getting eye injections and expects to gain vision back.   OptivolThoracic impedancenormal.  Prescribed:Furosemide 40 mg 1 tablet daily. Dr Caryl Comes advised patient can take extra Furosemide when needed (see 04/14/2018 phone note).   Labs: 08/26/2018 Creatinine 1.27, BUN 16, Potassium 4.2, Sodium 140, GFR 54.89 01/17/2018 Creatinine 1.24, BUN 13, Potassium 3.9, Sodium 140, GFR 60.06 Acomplete set of results can be found in Results Review.  Recommendations: No changes and encouraged to call for any fluid symptoms.  Follow-up plan: ICM clinic phone appointment9/06/2018.  Copy of ICM check sent to [place].  3 month ICM trend: 10/24/2018    1 Year ICM trend:       Rosalene Billings, RN 10/25/2018 12:02 PM

## 2018-11-07 ENCOUNTER — Telehealth: Payer: Self-pay | Admitting: Internal Medicine

## 2018-11-07 DIAGNOSIS — H353122 Nonexudative age-related macular degeneration, left eye, intermediate dry stage: Secondary | ICD-10-CM | POA: Diagnosis not present

## 2018-11-07 DIAGNOSIS — I25119 Atherosclerotic heart disease of native coronary artery with unspecified angina pectoris: Secondary | ICD-10-CM

## 2018-11-07 DIAGNOSIS — I255 Ischemic cardiomyopathy: Secondary | ICD-10-CM

## 2018-11-07 DIAGNOSIS — H353221 Exudative age-related macular degeneration, left eye, with active choroidal neovascularization: Secondary | ICD-10-CM | POA: Diagnosis not present

## 2018-11-07 DIAGNOSIS — H353211 Exudative age-related macular degeneration, right eye, with active choroidal neovascularization: Secondary | ICD-10-CM | POA: Diagnosis not present

## 2018-11-07 NOTE — Telephone Encounter (Signed)
New Message   Patient is calling about scheduling his appt for his battery change out. Please call

## 2018-11-08 ENCOUNTER — Encounter: Payer: Self-pay | Admitting: Internal Medicine

## 2018-11-08 NOTE — Telephone Encounter (Signed)
Per Dr Caryl Comes, he would like pt to have a Zio patch placed for a few days. Pt has been instructed to "work out" to his tolerance and send his results back in for review. This is in lieu of a cardiac stress test during COVID. Pt has chosen to have his procedure on 8/31. He understands I will contact him back with instructions on 8/12.  He has verbalized understanding and had no additional questions.

## 2018-11-08 NOTE — Progress Notes (Signed)
Device at [facility]   Reviewing our thinking about how to proceed with device replacement, the patient has decided he would like a high-voltage device.  The question that was raised

## 2018-11-08 NOTE — Progress Notes (Signed)
Reviewing the discussions that we have had with Mark Clayton is device generator replacement presents itself.  We have discussed end-of-life issues and he would like to proceed high-voltage device replacement.  He has a quasi left bundle branch block, notably there is a Q wave in lead V6 and relatively narrow QRS is in leads I and L and so am not particularly enamored with the idea of CRT upgrade; in this regard his LV function has been in the 20-30% range for more than a half dozen years.  The other issue has been whether he is chronotropically competent.  Resting rates have been in the 60s.  Exercise testing is precluded because of COVID-19.  We will undertake a ZIO Patch to look for chronotropic excursion.  In the event that he is significantly limited, would consider atrial upgrade.  At this time could consider CRT upgrade.  The data from REPLACE 2 highlights the high risk of upgrade potential is a third procedure.  An antimicrobial pouch might be helpful.

## 2018-11-08 NOTE — Telephone Encounter (Signed)
Follow up    Patient is following up on when he can schedule a battery change out. Please call

## 2018-11-08 NOTE — H&P (View-Only) (Signed)
Reviewing the discussions that we have had with Mark Clayton is device generator replacement presents itself.  We have discussed end-of-life issues and he would like to proceed high-voltage device replacement.  He has a quasi left bundle branch block, notably there is a Q wave in lead V6 and relatively narrow QRS is in leads I and L and so am not particularly enamored with the idea of CRT upgrade; in this regard his LV function has been in the 20-30% range for more than a half dozen years.  The other issue has been whether he is chronotropically competent.  Resting rates have been in the 60s.  Exercise testing is precluded because of COVID-19.  We will undertake a ZIO Patch to look for chronotropic excursion.  In the event that he is significantly limited, would consider atrial upgrade.  At this time could consider CRT upgrade.  The data from REPLACE 2 highlights the high risk of upgrade potential is a third procedure.  An antimicrobial pouch might be helpful.

## 2018-11-09 ENCOUNTER — Telehealth: Payer: Self-pay | Admitting: *Deleted

## 2018-11-09 NOTE — Telephone Encounter (Signed)
4 day ZIO XT long term holter monitor to be mailed to patients home.  Instructions reviewed briefly as they are included in the monitor kit.  Patient instructed to work. out, "to his tolerance", per Dr. Olin Pia instructions.  Patient instructed to push button on monitor and log exercise sessions in log book.

## 2018-11-09 NOTE — Telephone Encounter (Signed)
LVM for pt to return call to review instructions.

## 2018-11-11 ENCOUNTER — Other Ambulatory Visit: Payer: Self-pay | Admitting: Internal Medicine

## 2018-11-11 NOTE — Telephone Encounter (Signed)
Patient needs an appointment for further refills. If patient does not want to schedule an appointment please make them aware to contact PCP for refills. Thanks Ladies!  

## 2018-11-13 ENCOUNTER — Ambulatory Visit (INDEPENDENT_AMBULATORY_CARE_PROVIDER_SITE_OTHER): Payer: Medicare Other

## 2018-11-13 DIAGNOSIS — I25119 Atherosclerotic heart disease of native coronary artery with unspecified angina pectoris: Secondary | ICD-10-CM | POA: Diagnosis not present

## 2018-11-13 DIAGNOSIS — I493 Ventricular premature depolarization: Secondary | ICD-10-CM

## 2018-11-13 DIAGNOSIS — R001 Bradycardia, unspecified: Secondary | ICD-10-CM | POA: Diagnosis not present

## 2018-11-13 DIAGNOSIS — I255 Ischemic cardiomyopathy: Secondary | ICD-10-CM | POA: Diagnosis not present

## 2018-11-16 ENCOUNTER — Telehealth: Payer: Self-pay

## 2018-11-16 ENCOUNTER — Ambulatory Visit (INDEPENDENT_AMBULATORY_CARE_PROVIDER_SITE_OTHER): Payer: Medicare Other | Admitting: Nurse Practitioner

## 2018-11-16 ENCOUNTER — Encounter: Payer: Self-pay | Admitting: Nurse Practitioner

## 2018-11-16 ENCOUNTER — Other Ambulatory Visit: Payer: Self-pay

## 2018-11-16 VITALS — BP 128/60 | HR 52 | Ht 72.0 in | Wt 167.0 lb

## 2018-11-16 DIAGNOSIS — I5022 Chronic systolic (congestive) heart failure: Secondary | ICD-10-CM

## 2018-11-16 DIAGNOSIS — N183 Chronic kidney disease, stage 3 unspecified: Secondary | ICD-10-CM

## 2018-11-16 DIAGNOSIS — R079 Chest pain, unspecified: Secondary | ICD-10-CM | POA: Diagnosis not present

## 2018-11-16 DIAGNOSIS — E785 Hyperlipidemia, unspecified: Secondary | ICD-10-CM

## 2018-11-16 DIAGNOSIS — I255 Ischemic cardiomyopathy: Secondary | ICD-10-CM

## 2018-11-16 DIAGNOSIS — I480 Paroxysmal atrial fibrillation: Secondary | ICD-10-CM

## 2018-11-16 DIAGNOSIS — I25110 Atherosclerotic heart disease of native coronary artery with unstable angina pectoris: Secondary | ICD-10-CM

## 2018-11-16 DIAGNOSIS — I200 Unstable angina: Secondary | ICD-10-CM

## 2018-11-16 DIAGNOSIS — I10 Essential (primary) hypertension: Secondary | ICD-10-CM | POA: Diagnosis not present

## 2018-11-16 MED ORDER — CARVEDILOL 6.25 MG PO TABS: 6.2500 mg | ORAL_TABLET | Freq: Two times a day (BID) | ORAL | 6 refills | Status: DC

## 2018-11-16 NOTE — H&P (View-Only) (Signed)
Cardiology Clinic Note   Patient Name: Mark Clayton Date of Encounter: 11/16/2018  Primary Care Provider:  [name], MD Primary Cardiologist:  Nelva Bush, MD  Patient Profile   78 y/o ? with a history of CAD status post CABG in 2003 with subsequent stenting in 2016, ischemic cardiomyopathy status post AICD, chronic systolic congestive heart failure, paroxysmal atrial fibrillation, hypertension, hyperlipidemia, colon cancer, and anemia, who presents for follow-up related to CAD and cardiomyopathy.  Past Medical History    Past Medical History:  Diagnosis Date   AICD (automatic cardioverter/defibrillator) present 01/17/2003   Medtronic Maximo 7232CX ICD, serial #[id number]   Anemia 02-06-11   takes oral iron   Arthritis    hands, knees   CAD (coronary artery disease) 2003   a. h/o MI and CABG in 2003. b. s/p DES to SVG-RPDA-RPLB in 08/2014.   Cancer of sigmoid colon ([place]) 2012   a. s/p colon surgery.   Carotid bruit    Chronic systolic CHF (congestive heart failure) ([place])    a. EF 20% in 2014; b. 08/2017 Echo: EF 20-25%, diff HK, Gr3 DD. Triv AI. Mod MR. Sev dil LA. Mildly dil RV w/ mildly reduced RV fxn. Mildly dil RA. Mod TR. PASP 54mHg.   Cough    GERD (gastroesophageal reflux disease) 02-06-11   HTN (hypertension)    Hyperlipidemia    Ischemic cardiomyopathy    a. EF 20% in 2014. (Master study EF >20%); b. 08/2017 Echo: EF 20-25%, diff HK. Gr3 DD.   LV (left ventricular) mural thrombus    a. 12/2012 Echo: EF 20% with mural thrombus No evidence of thrombus on 08/2017 echo.   Macular degeneration    Myocardial infarct ([place])    2003   PAF (paroxysmal atrial fibrillation) ([facility])    a. CHA2DS2VASc = 5-->Xarelto/Tikosyn.   Past Surgical History:  Procedure Laterality Date   CARDIAC CATHETERIZATION N/A 09/20/2014   Procedure: Left Heart Cath and Coronary Angiography;  Surgeon: Jettie Booze, MD; LAD 95%, D1 100%, CFX liner percent, OM  200%, OM 390%, RCA 90%, LIMA-LAD okay, SVG-OM 2-OM 3 minimal disease, SVG-RPDA-RPLB 100% between the RPDA and RPL      CARDIAC CATHETERIZATION N/A 09/20/2014   Procedure: Coronary Stent Intervention;  Surgeon: Jettie Booze, MD; Synergy DES 4 x 24 mm reducing the stenosis to 5%    CARDIAC DEFIBRILLATOR PLACEMENT  01/17/03   6949 lead. Medtronic. remote-no; with later revision   CARDIOVERSION N/A 05/22/2016   Procedure: CARDIOVERSION;  Surgeon: Lelon Perla, MD;  Location: [facility];  Service: Cardiovascular;  Laterality: N/A;   CARDIOVERSION N/A 08/10/2017   Procedure: CARDIOVERSION;  Surgeon: Pixie Casino, MD;  Location: [facility] ENDOSCOPY;  Service: Cardiovascular;  Laterality: N/A;   CATARACT EXTRACTION W/ INTRAOCULAR LENS  IMPLANT, BILATERAL Bilateral June/-July 2009   Dr. Katy Fitch   COLON RESECTION  02/09/2011   Procedure: LAPAROSCOPIC SIGMOID COLON RESECTION;  Surgeon: Pedro Earls, MD;  Location: [facility] ORS;  Service: General;  Laterality: N/A;  Laparoscopic Assisted Sigmoid Colectomy   COLON SURGERY     COLONOSCOPY  08/31/2011   Procedure: COLONOSCOPY;  Surgeon: Jerene Bears, MD;  Location: [facility] ENDOSCOPY;  Service: Gastroenterology;  Laterality: N/A;   COLONOSCOPY N/A 09/05/2012   Procedure: COLONOSCOPY;  Surgeon: Jerene Bears, MD;  Location: [facility] ENDOSCOPY;  Service: Gastroenterology;  Laterality: N/A;   COLONOSCOPY N/A 04/18/2013   Procedure: COLONOSCOPY;  Surgeon: Jerene Bears, MD;  Location: [facility];  Service: Gastroenterology;  Laterality: N/A;   COLONOSCOPY N/A 04/09/2014   Procedure: COLONOSCOPY;  Surgeon: Jerene Bears, MD;  Location: [facility];  Service: Gastroenterology;  Laterality: N/A;   COLONOSCOPY WITH PROPOFOL N/A 05/03/2017   Procedure: COLONOSCOPY WITH PROPOFOL;  Surgeon: Jerene Bears, MD;  Location: [facility];  Service: Gastroenterology;  Laterality: N/A;   CORONARY ANGIOPLASTY     CORONARY ARTERY BYPASS GRAFT  01/2002   LIMA-LAD, SVG-OM 2-OM 3,  SVG-RPDA-RPLB   ICD GENERATOR CHANGE  2010   Medtronic Virtuoso II VR ICD   INGUINAL HERNIA REPAIR Right 2000's X 2   LAPAROSCOPIC RIGHT HEMI COLECTOMY N/A 11/04/2012   Procedure: LAPAROSCOPIC RIGHT HEMI COLECTOMY;  Surgeon: Pedro Earls, MD;  Location: [facility];  Service: General;  Laterality: N/A;   TONSILLECTOMY  ~ 1950    Allergies  Allergies  Allergen Reactions   Latex Rash   Tape Rash and Other (See Comments)    USE PAPER    History of Present Illness    78 y/o ? with a history of CAD status post CABG in 2003 with subsequent stenting in 2016, ischemic cardiomyopathy status post AICD, chronic systolic congestive heart failure, paroxysmal atrial fibrillation, hypertension, hyperlipidemia, colon cancer, and anemia.  As noted, he under went CABG x5 in 2003 after suffering a myocardial infarction.  He subsequently required placement of an AICD in the setting of ongoing LV dysfunction ischemic cardiomyopathy, in October 2004.  In June 2016, he underwent drug-eluting stent placement to the vein graft  RPDA  RPLB.  His last echocardiogram in June 2019 showed persistent LV dysfunction with an EF of 20-25%, diffuse hypokinesis, and grade 3 diastolic dysfunction.  In the setting of paroxysmal atrial fibrillation, he has been maintained on dofetilide therapy and has been anticoagulated with Xarelto in the setting of a CHA2DS2VASc of 5.  He was last seen in cardiology clinic in October 2019, at which time he was stable.  He has since been followed by electrophysiology with remote device checks and is now at a point where he is due for device upgrade secondary to elective replacement indicators.  In the setting of need for upgrade, he was mailed a 4-day ZIO monitor earlier this month to assess for chronotropic excursion and determine whether or not he will require an atrial lead placement and CRT upgrade.  He is wearing the ZIO patch today.  He notes that though he feels he has been doing well  from a heart failure standpoint, he has been experiencing exertional chest pressure/tightness and dyspnea over the past 3+ months.  This is been happening up to about 5 times per week and requires nitroglycerin 1 or 2 times per week.  Over the past few weeks, he has had several episodes of nocturnal angina which has required nitroglycerin.  Symptoms typically last less than 5 minutes and are associated with dyspnea.  Symptoms are reminiscent of angina he experienced prior to stenting of his vein graft in 2016 and he says that he feels like he needs another stent.  Unfortunately, due to soft blood pressures when he takes his isosorbide during the day, further titration of long-acting nitrates is not likely feasible.  He denies palpitations, PND, orthopnea, dizziness, syncope, edema, or early satiety.  Home Medications    Prior to Admission medications   Medication Sig Start Date End Date Taking? Authorizing Provider  carvedilol (COREG) 12.5 MG tablet TAKE 1 TABLET BY MOUTH TWO  TIMES DAILY WITH A MEAL 08/08/18   End, Harrell Gave,  MD  dofetilide (TIKOSYN) 250 MCG capsule Take 1 capsule (250 mcg total) by mouth 2 (two) times daily. 06/15/18   Sherran Needs, NP  furosemide (LASIX) 40 MG tablet TAKE 1 TABLET BY MOUTH  DAILY 03/31/18   [name], MD  isosorbide mononitrate (IMDUR) 30 MG 24 hr tablet TAKE 1 TABLET BY MOUTH  DAILY 02/14/18   Deboraha Sprang, MD  losartan (COZAAR) 50 MG tablet TAKE 1 TABLET BY MOUTH  DAILY 10/03/18   [name], MD  meclizine (ANTIVERT) 25 MG tablet TAKE 1 TABLET BY MOUTH  DAILY AS NEEDED FOR  DIZZINESS. 04/04/18   [name], MD  nitroGLYCERIN (NITROSTAT) 0.4 MG SL tablet DISSOLVE 1 TABLET UNDER THE TONGUE EVERY 5 MINUTES AS  NEEDED FOR CHEST PAIN UP TO 3 DOSES. CALL 911 IF CHEST  PAIN PERSISTS 10/03/18   Deboraha Sprang, MD  pantoprazole (PROTONIX) 40 MG tablet TAKE 1 TABLET BY MOUTH  DAILY 10/03/18   [name], MD  pravastatin (PRAVACHOL)  40 MG tablet TAKE 1 TABLET BY MOUTH AT  BEDTIME 08/15/18   End, Harrell Gave, MD  vitamin B-12 (CYANOCOBALAMIN) 100 MCG tablet Take 100 mcg by mouth every morning.    [provider]  VITAMIN K PO Take 1 tablet by mouth daily.    [provider]  XARELTO 20 MG TABS tablet TAKE 1 TABLET BY MOUTH  DAILY WITH SUPPER 02/09/18   End, Harrell Gave, MD    Family History    Family History  Problem Relation Age of Onset   Hypertension Father    Hyperlipidemia Father    Heart disease Father    Prostate cancer Father    Alzheimer's disease Mother    Hypertension Sister    Hyperlipidemia Sister    Colon cancer Neg Hx    Esophageal cancer Neg Hx    Rectal cancer Neg Hx    Stomach cancer Neg Hx    He indicated that his mother is deceased. He indicated that his father is deceased. He indicated that his maternal grandmother is deceased. He indicated that his maternal grandfather is deceased. He indicated that his paternal grandmother is deceased. He indicated that his paternal grandfather is deceased. He indicated that the status of his neg hx is unknown.  Social History    Social History   Socioeconomic History   Marital status: Widowed    Spouse name: Not on file   Number of children: 3   Years of education: Not on file   Highest education level: Not on file  Occupational History   Occupation: self employed  Scientist, product/process development strain: Not on file   Food insecurity    Worry: Not on file    Inability: Not on file   Transportation needs    Medical: Not on file    Non-medical: Not on file  Tobacco Use   Smoking status: Never Smoker   Smokeless tobacco: Never Used  Substance and Sexual Activity   Alcohol use: Yes    Alcohol/week: 2.0 standard drinks    Types: 2 Cans of beer per week   Drug use: No   Sexual activity: Yes  Lifestyle   Physical activity    Days per week: Not on file    Minutes per session: Not on file    Stress: Not on file  Relationships   Social connections    Talks on phone: Not on file    Gets together: Not on  file    Attends religious service: Not on file    Active member of club or organization: Not on file    Attends meetings of clubs or organizations: Not on file    Relationship status: Not on file   Intimate partner violence    Fear of current or ex partner: Not on file    Emotionally abused: Not on file    Physically abused: Not on file    Forced sexual activity: Not on file  Other Topics Concern   Not on file  Social History Narrative   Full time. Married.      Review of Systems    General:  No chills, fever, night sweats or weight changes.  Cardiovascular:  +++ ex/rest/nocturnal chest pain, +++ dyspnea on exertion, no edema, orthopnea, palpitations, paroxysmal nocturnal dyspnea. Dermatological: No rash, lesions/masses Respiratory: No cough, +++ dyspnea Urologic: No hematuria, dysuria Abdominal:   No nausea, vomiting, diarrhea, bright red blood per rectum, melena, or hematemesis Neurologic:  No visual changes, wkns, changes in mental status. All other systems reviewed and are otherwise negative except as noted above.  Physical Exam    VS:  BP 128/60 (BP Location: Left Arm, Patient Position: Sitting, Cuff Size: Normal)    Pulse (!) 52    Ht 6' (1.829 m)    Wt 167 lb (75.8 kg)    SpO2 97%    BMI 22.65 kg/m  , BMI Body mass index is 22.65 kg/m. GEN: Well nourished, well developed, in no acute distress. HEENT: normal. Neck: Supple, no JVD, carotid bruits, or masses. Cardiac: RRR, no murmurs, rubs, or gallops. No clubbing, cyanosis, edema.  Radials/DP/PT 2+ and equal bilaterally.  Respiratory:  Respirations regular and unlabored, clear to auscultation bilaterally. GI: Soft, nontender, nondistended, BS + x 4. MS: no deformity or atrophy. Skin: warm and dry, no rash. Neuro:  Strength and sensation are intact. Psych: Normal affect.  Accessory Clinical Findings      ECG personally reviewed by me today-sinus bradycardia, 46, first-degree AV block, left bundle branch block- No acute changes  Lab Results  Component Value Date   WBC 4.2 08/26/2018   HGB 12.1 (L) 08/26/2018   HCT 36.0 (L) 08/26/2018   MCV 86.4 08/26/2018   PLT 157.0 08/26/2018   Lab Results  Component Value Date   CREATININE 1.27 08/26/2018   BUN 16 08/26/2018   NA 140 08/26/2018   K 4.2 08/26/2018   CL 104 08/26/2018   CO2 28 08/26/2018   Lab Results  Component Value Date   ALT 7 08/26/2018   AST 12 08/26/2018   ALKPHOS 46 08/26/2018   BILITOT 0.8 08/26/2018   Lab Results  Component Value Date   CHOL 181 01/17/2018   HDL 56.40 01/17/2018   LDLCALC 106 (H) 01/17/2018   TRIG 94.0 01/17/2018   CHOLHDL 3 01/17/2018     Assessment & Plan   1.  Unstable angina/coronary artery disease: Patient with prior history MI and CABG in 2003 with subsequent drug-eluting stent placement to the vein graft to the RPDA-RPLB in 2016.  Today he reports a 3+ month history of exertional angina associated with dyspnea occurring up to about 5 times per week, lasting 1 to 5 minutes, and resolving with rest and/or nitroglycerin.  More recently, he has had nocturnal angina requiring sublingual nitroglycerin.  Symptoms are reminiscent of what he experienced prior to stenting in 2016.  He is on beta-blocker, nitrate, ARB, and statin therapy and pressures trend in the 1  teens at home.  Further titration of nitrate has been limited by relative hypotension and lightheadedness.  We discussed options for management and will arrange for diagnostic catheterization. The patient understands that risks include but are not limited to stroke (1 in 1000), death (1 in 52), kidney failure [usually temporary] (1 in 500), bleeding (1 in 200), allergic reaction [possibly serious] (1 in 200), and agrees to proceed.  Follow-up lab work today.  He will hold Xarelto for 48 hours prior to catheterization.  No aspirin in the  setting of chronic Xarelto.  Cont  blocker, ARB, and statin.  2.  Chronic systolic congestive heart failure/ischemic cardiomyopathy: EF 20 to 25% by echo last year.  He is euvolemic on examination.  He has had dyspnea on exertion in the setting of unstable angina.  He remains on beta-blocker and ARB therapy.  He was not able to tolerate Spironolactone secondary to soft blood pressures.  In that setting, I do not think he would tolerate transition to [facility] either.  He is scheduled for ICD upgrade on August 31.  He is currently wearing a ZIO patch to determine whether or not he will require an atrial lead given sinus bradycardia.  3.  Sinus bradycardia: Heart rate 46 on  blocker therapy and he is currently being monitored to assess need for atrial lead.  Reduce  blocker to 6.25 bid.  See #2.  4.  Essential hypertension: Stable.  5.  Hyperlipidemia: LDL was 106 last year.  Follow-up lipids and LFTs today.  He is on Pravachol.  6.  Paroxysmal atrial fibrillation: Maintaining sinus rhythm on dofetilide.  Anticoagulated with Xarelto. QTc stable.  7.  CKD III:  F/u creat today.  8.  Disposition: Follow-up lab work today.  Plan on diagnostic catheterization next week.  Follow-up in clinic in 2 weeks.  Murray Hodgkins, NP 11/16/2018, 9:12 AM

## 2018-11-16 NOTE — Progress Notes (Signed)
Cardiology Clinic Note   Patient Name: Mark Clayton Date of Encounter: 11/16/2018  Primary Care Provider:  [name], MD Primary Cardiologist:  Nelva Bush, MD  Patient Profile   78 y/o ? with a history of CAD status post CABG in 2003 with subsequent stenting in 2016, ischemic cardiomyopathy status post AICD, chronic systolic congestive heart failure, paroxysmal atrial fibrillation, hypertension, hyperlipidemia, colon cancer, and anemia, who presents for follow-up related to CAD and cardiomyopathy.  Past Medical History    Past Medical History:  Diagnosis Date   AICD (automatic cardioverter/defibrillator) present 01/17/2003   Medtronic Maximo 7232CX ICD, serial #[id number]   Anemia 02-06-11   takes oral iron   Arthritis    hands, knees   CAD (coronary artery disease) 2003   a. h/o MI and CABG in 2003. b. s/p DES to SVG-RPDA-RPLB in 08/2014.   Cancer of sigmoid colon ([place]) 2012   a. s/p colon surgery.   Carotid bruit    Chronic systolic CHF (congestive heart failure) ([place])    a. EF 20% in 2014; b. 08/2017 Echo: EF 20-25%, diff HK, Gr3 DD. Triv AI. Mod MR. Sev dil LA. Mildly dil RV w/ mildly reduced RV fxn. Mildly dil RA. Mod TR. PASP 73mHg.   Cough    GERD (gastroesophageal reflux disease) 02-06-11   HTN (hypertension)    Hyperlipidemia    Ischemic cardiomyopathy    a. EF 20% in 2014. (Master study EF >20%); b. 08/2017 Echo: EF 20-25%, diff HK. Gr3 DD.   LV (left ventricular) mural thrombus    a. 12/2012 Echo: EF 20% with mural thrombus No evidence of thrombus on 08/2017 echo.   Macular degeneration    Myocardial infarct ([place])    2003   PAF (paroxysmal atrial fibrillation) ([facility])    a. CHA2DS2VASc = 5-->Xarelto/Tikosyn.   Past Surgical History:  Procedure Laterality Date   CARDIAC CATHETERIZATION N/A 09/20/2014   Procedure: Left Heart Cath and Coronary Angiography;  Surgeon: Jettie Booze, MD; LAD 95%, D1 100%, CFX liner percent, OM  200%, OM 390%, RCA 90%, LIMA-LAD okay, SVG-OM 2-OM 3 minimal disease, SVG-RPDA-RPLB 100% between the RPDA and RPL      CARDIAC CATHETERIZATION N/A 09/20/2014   Procedure: Coronary Stent Intervention;  Surgeon: Jettie Booze, MD; Synergy DES 4 x 24 mm reducing the stenosis to 5%    CARDIAC DEFIBRILLATOR PLACEMENT  01/17/03   6949 lead. Medtronic. remote-no; with later revision   CARDIOVERSION N/A 05/22/2016   Procedure: CARDIOVERSION;  Surgeon: Lelon Perla, MD;  Location: [facility];  Service: Cardiovascular;  Laterality: N/A;   CARDIOVERSION N/A 08/10/2017   Procedure: CARDIOVERSION;  Surgeon: Pixie Casino, MD;  Location: [facility] ENDOSCOPY;  Service: Cardiovascular;  Laterality: N/A;   CATARACT EXTRACTION W/ INTRAOCULAR LENS  IMPLANT, BILATERAL Bilateral June/-July 2009   Dr. Katy Fitch   COLON RESECTION  02/09/2011   Procedure: LAPAROSCOPIC SIGMOID COLON RESECTION;  Surgeon: Pedro Earls, MD;  Location: [facility] ORS;  Service: General;  Laterality: N/A;  Laparoscopic Assisted Sigmoid Colectomy   COLON SURGERY     COLONOSCOPY  08/31/2011   Procedure: COLONOSCOPY;  Surgeon: Jerene Bears, MD;  Location: [facility] ENDOSCOPY;  Service: Gastroenterology;  Laterality: N/A;   COLONOSCOPY N/A 09/05/2012   Procedure: COLONOSCOPY;  Surgeon: Jerene Bears, MD;  Location: [facility] ENDOSCOPY;  Service: Gastroenterology;  Laterality: N/A;   COLONOSCOPY N/A 04/18/2013   Procedure: COLONOSCOPY;  Surgeon: Jerene Bears, MD;  Location: [facility]OSCOPY;  Service: Gastroenterology;  Laterality: N/A;   COLONOSCOPY N/A 04/09/2014   Procedure: COLONOSCOPY;  Surgeon: Jerene Bears, MD;  Location: [facility];  Service: Gastroenterology;  Laterality: N/A;   COLONOSCOPY WITH PROPOFOL N/A 05/03/2017   Procedure: COLONOSCOPY WITH PROPOFOL;  Surgeon: Jerene Bears, MD;  Location: [facility] ENDOS[facility];  Service: Gastroenterology;  Laterality: N/A;   CORONARY ANGIOPLASTY     CORONARY ARTERY BYPASS GRAFT  01/2002   LIMA-LAD, SVG-OM 2-OM 3,  SVG-RPDA-RPLB   ICD GENERATOR CHANGE  2010   Medtronic Virtuoso II VR ICD   INGUINAL HERNIA REPAIR Right 2000's X 2   LAPAROSCOPIC RIGHT HEMI COLECTOMY N/A 11/04/2012   Procedure: LAPAROSCOPIC RIGHT HEMI COLECTOMY;  Surgeon: Pedro Earls, MD;  Location: [facility];  Service: General;  Laterality: N/A;   TONSILLECTOMY  ~ 1950    Allergies  Allergies  Allergen Reactions   Latex Rash   Tape Rash and Other (See Comments)    USE PAPER    History of Present Illness    78 y/o ? with a history of CAD status post CABG in 2003 with subsequent stenting in 2016, ischemic cardiomyopathy status post AICD, chronic systolic congestive heart failure, paroxysmal atrial fibrillation, hypertension, hyperlipidemia, colon cancer, and anemia.  As noted, he under went CABG x5 in 2003 after suffering a myocardial infarction.  He subsequently required placement of an AICD in the setting of ongoing LV dysfunction ischemic cardiomyopathy, in October 2004.  In June 2016, he underwent drug-eluting stent placement to the vein graft  RPDA  RPLB.  His last echocardiogram in June 2019 showed persistent LV dysfunction with an EF of 20-25%, diffuse hypokinesis, and grade 3 diastolic dysfunction.  In the setting of paroxysmal atrial fibrillation, he has been maintained on dofetilide therapy and has been anticoagulated with Xarelto in the setting of a CHA2DS2VASc of 5.  He was last seen in cardiology clinic in October 2019, at which time he was stable.  He has since been followed by electrophysiology with remote device checks and is now at a point where he is due for device upgrade secondary to elective replacement indicators.  In the setting of need for upgrade, he was mailed a 4-day ZIO monitor earlier this month to assess for chronotropic excursion and determine whether or not he will require an atrial lead placement and CRT upgrade.  He is wearing the ZIO patch today.  He notes that though he feels he has been doing well  from a heart failure standpoint, he has been experiencing exertional chest pressure/tightness and dyspnea over the past 3+ months.  This is been happening up to about 5 times per week and requires nitroglycerin 1 or 2 times per week.  Over the past few weeks, he has had several episodes of nocturnal angina which has required nitroglycerin.  Symptoms typically last less than 5 minutes and are associated with dyspnea.  Symptoms are reminiscent of angina he experienced prior to stenting of his vein graft in 2016 and he says that he feels like he needs another stent.  Unfortunately, due to soft blood pressures when he takes his isosorbide during the day, further titration of long-acting nitrates is not likely feasible.  He denies palpitations, PND, orthopnea, dizziness, syncope, edema, or early satiety.  Home Medications    Prior to Admission medications   Medication Sig Start Date End Date Taking? Authorizing Provider  carvedilol (COREG) 12.5 MG tablet TAKE 1 TABLET BY MOUTH TWO  TIMES DAILY WITH A MEAL 08/08/18   End, Harrell Gave,  MD  dofetilide (TIKOSYN) 250 MCG capsule Take 1 capsule (250 mcg total) by mouth 2 (two) times daily. 06/15/18   Sherran Needs, NP  furosemide (LASIX) 40 MG tablet TAKE 1 TABLET BY MOUTH  DAILY 03/31/18   [name], MD  isosorbide mononitrate (IMDUR) 30 MG 24 hr tablet TAKE 1 TABLET BY MOUTH  DAILY 02/14/18   Deboraha Sprang, MD  losartan (COZAAR) 50 MG tablet TAKE 1 TABLET BY MOUTH  DAILY 10/03/18   [name], MD  meclizine (ANTIVERT) 25 MG tablet TAKE 1 TABLET BY MOUTH  DAILY AS NEEDED FOR  DIZZINESS. 04/04/18   [name], MD  nitroGLYCERIN (NITROSTAT) 0.4 MG SL tablet DISSOLVE 1 TABLET UNDER THE TONGUE EVERY 5 MINUTES AS  NEEDED FOR CHEST PAIN UP TO 3 DOSES. CALL 911 IF CHEST  PAIN PERSISTS 10/03/18   Deboraha Sprang, MD  pantoprazole (PROTONIX) 40 MG tablet TAKE 1 TABLET BY MOUTH  DAILY 10/03/18   [name], MD  pravastatin (PRAVACHOL)  40 MG tablet TAKE 1 TABLET BY MOUTH AT  BEDTIME 08/15/18   End, Harrell Gave, MD  vitamin B-12 (CYANOCOBALAMIN) 100 MCG tablet Take 100 mcg by mouth every morning.    [provider]  VITAMIN K PO Take 1 tablet by mouth daily.    [provider]  XARELTO 20 MG TABS tablet TAKE 1 TABLET BY MOUTH  DAILY WITH SUPPER 02/09/18   End, Harrell Gave, MD    Family History    Family History  Problem Relation Age of Onset   Hypertension Father    Hyperlipidemia Father    Heart disease Father    Prostate cancer Father    Alzheimer's disease Mother    Hypertension Sister    Hyperlipidemia Sister    Colon cancer Neg Hx    Esophageal cancer Neg Hx    Rectal cancer Neg Hx    Stomach cancer Neg Hx    He indicated that his mother is deceased. He indicated that his father is deceased. He indicated that his maternal grandmother is deceased. He indicated that his maternal grandfather is deceased. He indicated that his paternal grandmother is deceased. He indicated that his paternal grandfather is deceased. He indicated that the status of his neg hx is unknown.  Social History    Social History   Socioeconomic History   Marital status: Widowed    Spouse name: Not on file   Number of children: 3   Years of education: Not on file   Highest education level: Not on file  Occupational History   Occupation: self employed  Scientist, product/process development strain: Not on file   Food insecurity    Worry: Not on file    Inability: Not on file   Transportation needs    Medical: Not on file    Non-medical: Not on file  Tobacco Use   Smoking status: Never Smoker   Smokeless tobacco: Never Used  Substance and Sexual Activity   Alcohol use: Yes    Alcohol/week: 2.0 standard drinks    Types: 2 Cans of beer per week   Drug use: No   Sexual activity: Yes  Lifestyle   Physical activity    Days per week: Not on file    Minutes per session: Not on file    Stress: Not on file  Relationships   Social connections    Talks on phone: Not on file    Gets together: Not on  file    Attends religious service: Not on file    Active member of club or organization: Not on file    Attends meetings of clubs or organizations: Not on file    Relationship status: Not on file   Intimate partner violence    Fear of current or ex partner: Not on file    Emotionally abused: Not on file    Physically abused: Not on file    Forced sexual activity: Not on file  Other Topics Concern   Not on file  Social History Narrative   Full time. Married.      Review of Systems    General:  No chills, fever, night sweats or weight changes.  Cardiovascular:  +++ ex/rest/nocturnal chest pain, +++ dyspnea on exertion, no edema, orthopnea, palpitations, paroxysmal nocturnal dyspnea. Dermatological: No rash, lesions/masses Respiratory: No cough, +++ dyspnea Urologic: No hematuria, dysuria Abdominal:   No nausea, vomiting, diarrhea, bright red blood per rectum, melena, or hematemesis Neurologic:  No visual changes, wkns, changes in mental status. All other systems reviewed and are otherwise negative except as noted above.  Physical Exam    VS:  BP 128/60 (BP Location: Left Arm, Patient Position: Sitting, Cuff Size: Normal)    Pulse (!) 52    Ht 6' (1.829 m)    Wt 167 lb (75.8 kg)    SpO2 97%    BMI 22.65 kg/m  , BMI Body mass index is 22.65 kg/m. GEN: Well nourished, well developed, in no acute distress. HEENT: normal. Neck: Supple, no JVD, carotid bruits, or masses. Cardiac: RRR, no murmurs, rubs, or gallops. No clubbing, cyanosis, edema.  Radials/DP/PT 2+ and equal bilaterally.  Respiratory:  Respirations regular and unlabored, clear to auscultation bilaterally. GI: Soft, nontender, nondistended, BS + x 4. MS: no deformity or atrophy. Skin: warm and dry, no rash. Neuro:  Strength and sensation are intact. Psych: Normal affect.  Accessory Clinical Findings      ECG personally reviewed by me today-sinus bradycardia, 46, first-degree AV block, left bundle branch block- No acute changes  Lab Results  Component Value Date   WBC 4.2 08/26/2018   HGB 12.1 (L) 08/26/2018   HCT 36.0 (L) 08/26/2018   MCV 86.4 08/26/2018   PLT 157.0 08/26/2018   Lab Results  Component Value Date   CREATININE 1.27 08/26/2018   BUN 16 08/26/2018   NA 140 08/26/2018   K 4.2 08/26/2018   CL 104 08/26/2018   CO2 28 08/26/2018   Lab Results  Component Value Date   ALT 7 08/26/2018   AST 12 08/26/2018   ALKPHOS 46 08/26/2018   BILITOT 0.8 08/26/2018   Lab Results  Component Value Date   CHOL 181 01/17/2018   HDL 56.40 01/17/2018   LDLCALC 106 (H) 01/17/2018   TRIG 94.0 01/17/2018   CHOLHDL 3 01/17/2018     Assessment & Plan   1.  Unstable angina/coronary artery disease: Patient with prior history MI and CABG in 2003 with subsequent drug-eluting stent placement to the vein graft to the RPDA-RPLB in 2016.  Today he reports a 3+ month history of exertional angina associated with dyspnea occurring up to about 5 times per week, lasting 1 to 5 minutes, and resolving with rest and/or nitroglycerin.  More recently, he has had nocturnal angina requiring sublingual nitroglycerin.  Symptoms are reminiscent of what he experienced prior to stenting in 2016.  He is on beta-blocker, nitrate, ARB, and statin therapy and pressures trend in the 1  teens at home.  Further titration of nitrate has been limited by relative hypotension and lightheadedness.  We discussed options for management and will arrange for diagnostic catheterization. The patient understands that risks include but are not limited to stroke (1 in 1000), death (1 in 52), kidney failure [usually temporary] (1 in 500), bleeding (1 in 200), allergic reaction [possibly serious] (1 in 200), and agrees to proceed.  Follow-up lab work today.  He will hold Xarelto for 48 hours prior to catheterization.  No aspirin in the  setting of chronic Xarelto.  Cont  blocker, ARB, and statin.  2.  Chronic systolic congestive heart failure/ischemic cardiomyopathy: EF 20 to 25% by echo last year.  He is euvolemic on examination.  He has had dyspnea on exertion in the setting of unstable angina.  He remains on beta-blocker and ARB therapy.  He was not able to tolerate Spironolactone secondary to soft blood pressures.  In that setting, I do not think he would tolerate transition to [facility] either.  He is scheduled for ICD upgrade on August 31.  He is currently wearing a ZIO patch to determine whether or not he will require an atrial lead given sinus bradycardia.  3.  Sinus bradycardia: Heart rate 46 on  blocker therapy and he is currently being monitored to assess need for atrial lead.  Reduce  blocker to 6.25 bid.  See #2.  4.  Essential hypertension: Stable.  5.  Hyperlipidemia: LDL was 106 last year.  Follow-up lipids and LFTs today.  He is on Pravachol.  6.  Paroxysmal atrial fibrillation: Maintaining sinus rhythm on dofetilide.  Anticoagulated with Xarelto. QTc stable.  7.  CKD III:  F/u creat today.  8.  Disposition: Follow-up lab work today.  Plan on diagnostic catheterization next week.  Follow-up in clinic in 2 weeks.  Murray Hodgkins, NP 11/16/2018, 9:12 AM

## 2018-11-16 NOTE — H&P (View-Only) (Signed)
Cardiology Clinic Note   Patient Name: Mark Clayton Date of Encounter: 11/16/2018  Primary Care Provider:  [name], MD Primary Cardiologist:  Nelva Bush, MD  Patient Profile   78 y/o ? with a history of CAD status post CABG in 2003 with subsequent stenting in 2016, ischemic cardiomyopathy status post AICD, chronic systolic congestive heart failure, paroxysmal atrial fibrillation, hypertension, hyperlipidemia, colon cancer, and anemia, who presents for follow-up related to CAD and cardiomyopathy.  Past Medical History    Past Medical History:  Diagnosis Date   AICD (automatic cardioverter/defibrillator) present 01/17/2003   Medtronic Maximo 7232CX ICD, serial #[id number]   Anemia 02-06-11   takes oral iron   Arthritis    hands, knees   CAD (coronary artery disease) 2003   a. h/o MI and CABG in 2003. b. s/p DES to SVG-RPDA-RPLB in 08/2014.   Cancer of sigmoid colon ([place]) 2012   a. s/p colon surgery.   Carotid bruit    Chronic systolic CHF (congestive heart failure) ([place])    a. EF 20% in 2014; b. 08/2017 Echo: EF 20-25%, diff HK, Gr3 DD. Triv AI. Mod MR. Sev dil LA. Mildly dil RV w/ mildly reduced RV fxn. Mildly dil RA. Mod TR. PASP 60mHg.   Cough    GERD (gastroesophageal reflux disease) 02-06-11   HTN (hypertension)    Hyperlipidemia    Ischemic cardiomyopathy    a. EF 20% in 2014. (Master study EF >20%); b. 08/2017 Echo: EF 20-25%, diff HK. Gr3 DD.   LV (left ventricular) mural thrombus    a. 12/2012 Echo: EF 20% with mural thrombus No evidence of thrombus on 08/2017 echo.   Macular degeneration    Myocardial infarct ([place])    2003   PAF (paroxysmal atrial fibrillation) ([facility])    a. CHA2DS2VASc = 5-->Xarelto/Tikosyn.   Past Surgical History:  Procedure Laterality Date   CARDIAC CATHETERIZATION N/A 09/20/2014   Procedure: Left Heart Cath and Coronary Angiography;  Surgeon: Jettie Booze, MD; LAD 95%, D1 100%, CFX liner percent, OM  200%, OM 390%, RCA 90%, LIMA-LAD okay, SVG-OM 2-OM 3 minimal disease, SVG-RPDA-RPLB 100% between the RPDA and RPL      CARDIAC CATHETERIZATION N/A 09/20/2014   Procedure: Coronary Stent Intervention;  Surgeon: Jettie Booze, MD; Synergy DES 4 x 24 mm reducing the stenosis to 5%    CARDIAC DEFIBRILLATOR PLACEMENT  01/17/03   6949 lead. Medtronic. remote-no; with later revision   CARDIOVERSION N/A 05/22/2016   Procedure: CARDIOVERSION;  Surgeon: Lelon Perla, MD;  Location: [facility];  Service: Cardiovascular;  Laterality: N/A;   CARDIOVERSION N/A 08/10/2017   Procedure: CARDIOVERSION;  Surgeon: Pixie Casino, MD;  Location: [facility];  Service: Cardiovascular;  Laterality: N/A;   CATARACT EXTRACTION W/ INTRAOCULAR LENS  IMPLANT, BILATERAL Bilateral June/-July 2009   Dr. Katy Fitch   COLON RESECTION  02/09/2011   Procedure: LAPAROSCOPIC SIGMOID COLON RESECTION;  Surgeon: Pedro Earls, MD;  Location: [facility];  Service: General;  Laterality: N/A;  Laparoscopic Assisted Sigmoid Colectomy   COLON SURGERY     COLONOSCOPY  08/31/2011   Procedure: COLONOSCOPY;  Surgeon: Jerene Bears, MD;  Location: [facility];  Service: Gastroenterology;  Laterality: N/A;   COLONOSCOPY N/A 09/05/2012   Procedure: COLONOSCOPY;  Surgeon: Jerene Bears, MD;  Location: [facility];  Service: Gastroenterology;  Laterality: N/A;   COLONOSCOPY N/A 04/18/2013   Procedure: COLONOSCOPY;  Surgeon: Jerene Bears, MD;  Location: [facility];  Service: Gastroenterology;  Laterality: N/A;   COLONOSCOPY N/A 04/09/2014   Procedure: COLONOSCOPY;  Surgeon: Jerene Bears, MD;  Location: [facility];  Service: Gastroenterology;  Laterality: N/A;   COLONOSCOPY WITH PROPOFOL N/A 05/03/2017   Procedure: COLONOSCOPY WITH PROPOFOL;  Surgeon: Jerene Bears, MD;  Location: [facility];  Service: Gastroenterology;  Laterality: N/A;   CORONARY ANGIOPLASTY     CORONARY ARTERY BYPASS GRAFT  01/2002   LIMA-LAD, SVG-OM 2-OM 3,  SVG-RPDA-RPLB   ICD GENERATOR CHANGE  2010   Medtronic Virtuoso II VR ICD   INGUINAL HERNIA REPAIR Right 2000's X 2   LAPAROSCOPIC RIGHT HEMI COLECTOMY N/A 11/04/2012   Procedure: LAPAROSCOPIC RIGHT HEMI COLECTOMY;  Surgeon: Pedro Earls, MD;  Location: [facility];  Service: General;  Laterality: N/A;   TONSILLECTOMY  ~ 1950    Allergies  Allergies  Allergen Reactions   Latex Rash   Tape Rash and Other (See Comments)    USE PAPER    History of Present Illness    78 y/o ? with a history of CAD status post CABG in 2003 with subsequent stenting in 2016, ischemic cardiomyopathy status post AICD, chronic systolic congestive heart failure, paroxysmal atrial fibrillation, hypertension, hyperlipidemia, colon cancer, and anemia.  As noted, he under went CABG x5 in 2003 after suffering a myocardial infarction.  He subsequently required placement of an AICD in the setting of ongoing LV dysfunction ischemic cardiomyopathy, in October 2004.  In June 2016, he underwent drug-eluting stent placement to the vein graft  RPDA  RPLB.  His last echocardiogram in June 2019 showed persistent LV dysfunction with an EF of 20-25%, diffuse hypokinesis, and grade 3 diastolic dysfunction.  In the setting of paroxysmal atrial fibrillation, he has been maintained on dofetilide therapy and has been anticoagulated with Xarelto in the setting of a CHA2DS2VASc of 5.  He was last seen in cardiology clinic in October 2019, at which time he was stable.  He has since been followed by electrophysiology with remote device checks and is now at a point where he is due for device upgrade secondary to elective replacement indicators.  In the setting of need for upgrade, he was mailed a 4-day ZIO monitor earlier this month to assess for chronotropic excursion and determine whether or not he will require an atrial lead placement and CRT upgrade.  He is wearing the ZIO patch today.  He notes that though he feels he has been doing well  from a heart failure standpoint, he has been experiencing exertional chest pressure/tightness and dyspnea over the past 3+ months.  This is been happening up to about 5 times per week and requires nitroglycerin 1 or 2 times per week.  Over the past few weeks, he has had several episodes of nocturnal angina which has required nitroglycerin.  Symptoms typically last less than 5 minutes and are associated with dyspnea.  Symptoms are reminiscent of angina he experienced prior to stenting of his vein graft in 2016 and he says that he feels like he needs another stent.  Unfortunately, due to soft blood pressures when he takes his isosorbide during the day, further titration of long-acting nitrates is not likely feasible.  He denies palpitations, PND, orthopnea, dizziness, syncope, edema, or early satiety.  Home Medications    Prior to Admission medications   Medication Sig Start Date End Date Taking? Authorizing Provider  carvedilol (COREG) 12.5 MG tablet TAKE 1 TABLET BY MOUTH TWO  TIMES DAILY WITH A MEAL 08/08/18   End, Harrell Gave,  MD  dofetilide (TIKOSYN) 250 MCG capsule Take 1 capsule (250 mcg total) by mouth 2 (two) times daily. 06/15/18   Sherran Needs, NP  furosemide (LASIX) 40 MG tablet TAKE 1 TABLET BY MOUTH  DAILY 03/31/18   [name], MD  isosorbide mononitrate (IMDUR) 30 MG 24 hr tablet TAKE 1 TABLET BY MOUTH  DAILY 02/14/18   Deboraha Sprang, MD  losartan (COZAAR) 50 MG tablet TAKE 1 TABLET BY MOUTH  DAILY 10/03/18   [name], MD  meclizine (ANTIVERT) 25 MG tablet TAKE 1 TABLET BY MOUTH  DAILY AS NEEDED FOR  DIZZINESS. 04/04/18   [name], MD  nitroGLYCERIN (NITROSTAT) 0.4 MG SL tablet DISSOLVE 1 TABLET UNDER THE TONGUE EVERY 5 MINUTES AS  NEEDED FOR CHEST PAIN UP TO 3 DOSES. CALL 911 IF CHEST  PAIN PERSISTS 10/03/18   Deboraha Sprang, MD  pantoprazole (PROTONIX) 40 MG tablet TAKE 1 TABLET BY MOUTH  DAILY 10/03/18   [name], MD  pravastatin (PRAVACHOL)  40 MG tablet TAKE 1 TABLET BY MOUTH AT  BEDTIME 08/15/18   End, Harrell Gave, MD  vitamin B-12 (CYANOCOBALAMIN) 100 MCG tablet Take 100 mcg by mouth every morning.    [provider]  VITAMIN K PO Take 1 tablet by mouth daily.    [provider]  XARELTO 20 MG TABS tablet TAKE 1 TABLET BY MOUTH  DAILY WITH SUPPER 02/09/18   End, Harrell Gave, MD    Family History    Family History  Problem Relation Age of Onset   Hypertension Father    Hyperlipidemia Father    Heart disease Father    Prostate cancer Father    Alzheimer's disease Mother    Hypertension Sister    Hyperlipidemia Sister    Colon cancer Neg Hx    Esophageal cancer Neg Hx    Rectal cancer Neg Hx    Stomach cancer Neg Hx    He indicated that his mother is deceased. He indicated that his father is deceased. He indicated that his maternal grandmother is deceased. He indicated that his maternal grandfather is deceased. He indicated that his paternal grandmother is deceased. He indicated that his paternal grandfather is deceased. He indicated that the status of his neg hx is unknown.  Social History    Social History   Socioeconomic History   Marital status: Widowed    Spouse name: Not on file   Number of children: 3   Years of education: Not on file   Highest education level: Not on file  Occupational History   Occupation: self employed  Scientist, product/process development strain: Not on file   Food insecurity    Worry: Not on file    Inability: Not on file   Transportation needs    Medical: Not on file    Non-medical: Not on file  Tobacco Use   Smoking status: Never Smoker   Smokeless tobacco: Never Used  Substance and Sexual Activity   Alcohol use: Yes    Alcohol/week: 2.0 standard drinks    Types: 2 Cans of beer per week   Drug use: No   Sexual activity: Yes  Lifestyle   Physical activity    Days per week: Not on file    Minutes per session: Not on file    Stress: Not on file  Relationships   Social connections    Talks on phone: Not on file    Gets together: Not on  file    Attends religious service: Not on file    Active member of club or organization: Not on file    Attends meetings of clubs or organizations: Not on file    Relationship status: Not on file   Intimate partner violence    Fear of current or ex partner: Not on file    Emotionally abused: Not on file    Physically abused: Not on file    Forced sexual activity: Not on file  Other Topics Concern   Not on file  Social History Narrative   Full time. Married.      Review of Systems    General:  No chills, fever, night sweats or weight changes.  Cardiovascular:  +++ ex/rest/nocturnal chest pain, +++ dyspnea on exertion, no edema, orthopnea, palpitations, paroxysmal nocturnal dyspnea. Dermatological: No rash, lesions/masses Respiratory: No cough, +++ dyspnea Urologic: No hematuria, dysuria Abdominal:   No nausea, vomiting, diarrhea, bright red blood per rectum, melena, or hematemesis Neurologic:  No visual changes, wkns, changes in mental status. All other systems reviewed and are otherwise negative except as noted above.  Physical Exam    VS:  BP 128/60 (BP Location: Left Arm, Patient Position: Sitting, Cuff Size: Normal)    Pulse (!) 52    Ht 6' (1.829 m)    Wt 167 lb (75.8 kg)    SpO2 97%    BMI 22.65 kg/m  , BMI Body mass index is 22.65 kg/m. GEN: Well nourished, well developed, in no acute distress. HEENT: normal. Neck: Supple, no JVD, carotid bruits, or masses. Cardiac: RRR, no murmurs, rubs, or gallops. No clubbing, cyanosis, edema.  Radials/DP/PT 2+ and equal bilaterally.  Respiratory:  Respirations regular and unlabored, clear to auscultation bilaterally. GI: Soft, nontender, nondistended, BS + x 4. MS: no deformity or atrophy. Skin: warm and dry, no rash. Neuro:  Strength and sensation are intact. Psych: Normal affect.  Accessory Clinical Findings      ECG personally reviewed by me today-sinus bradycardia, 46, first-degree AV block, left bundle branch block- No acute changes  Lab Results  Component Value Date   WBC 4.2 08/26/2018   HGB 12.1 (L) 08/26/2018   HCT 36.0 (L) 08/26/2018   MCV 86.4 08/26/2018   PLT 157.0 08/26/2018   Lab Results  Component Value Date   CREATININE 1.27 08/26/2018   BUN 16 08/26/2018   NA 140 08/26/2018   K 4.2 08/26/2018   CL 104 08/26/2018   CO2 28 08/26/2018   Lab Results  Component Value Date   ALT 7 08/26/2018   AST 12 08/26/2018   ALKPHOS 46 08/26/2018   BILITOT 0.8 08/26/2018   Lab Results  Component Value Date   CHOL 181 01/17/2018   HDL 56.40 01/17/2018   LDLCALC 106 (H) 01/17/2018   TRIG 94.0 01/17/2018   CHOLHDL 3 01/17/2018     Assessment & Plan   1.  Unstable angina/coronary artery disease: Patient with prior history MI and CABG in 2003 with subsequent drug-eluting stent placement to the vein graft to the RPDA-RPLB in 2016.  Today he reports a 3+ month history of exertional angina associated with dyspnea occurring up to about 5 times per week, lasting 1 to 5 minutes, and resolving with rest and/or nitroglycerin.  More recently, he has had nocturnal angina requiring sublingual nitroglycerin.  Symptoms are reminiscent of what he experienced prior to stenting in 2016.  He is on beta-blocker, nitrate, ARB, and statin therapy and pressures trend in the 1  teens at home.  Further titration of nitrate has been limited by relative hypotension and lightheadedness.  We discussed options for management and will arrange for diagnostic catheterization. The patient understands that risks include but are not limited to stroke (1 in 1000), death (1 in 53), kidney failure [usually temporary] (1 in 500), bleeding (1 in 200), allergic reaction [possibly serious] (1 in 200), and agrees to proceed.  Follow-up lab work today.  He will hold Xarelto for 48 hours prior to catheterization.  No aspirin in the  setting of chronic Xarelto.  Cont  blocker, ARB, and statin.  2.  Chronic systolic congestive heart failure/ischemic cardiomyopathy: EF 20 to 25% by echo last year.  He is euvolemic on examination.  He has had dyspnea on exertion in the setting of unstable angina.  He remains on beta-blocker and ARB therapy.  He was not able to tolerate Spironolactone secondary to soft blood pressures.  In that setting, I do not think he would tolerate transition to [facility] either.  He is scheduled for ICD upgrade on August 31.  He is currently wearing a ZIO patch to determine whether or not he will require an atrial lead given sinus bradycardia.  3.  Sinus bradycardia: Heart rate 46 on  blocker therapy and he is currently being monitored to assess need for atrial lead.  Reduce  blocker to 6.25 bid.  See #2.  4.  Essential hypertension: Stable.  5.  Hyperlipidemia: LDL was 106 last year.  Follow-up lipids and LFTs today.  He is on Pravachol.  6.  Paroxysmal atrial fibrillation: Maintaining sinus rhythm on dofetilide.  Anticoagulated with Xarelto. QTc stable.  7.  CKD III:  F/u creat today.  8.  Disposition: Follow-up lab work today.  Plan on diagnostic catheterization next week.  Follow-up in clinic in 2 weeks.  Murray Hodgkins, NP 11/16/2018, 9:12 AM

## 2018-11-16 NOTE — Patient Instructions (Signed)
Medication Instructions:  Your physician recommends that you continue on your current medications as directed. Please refer to the Current Medication list given to you today.  If you need a refill on your cardiac medications before your next appointment, please call your pharmacy.   Lab work: Art gallery manager, Psychologist, occupational, Lipid today  You will need to have a COVID test prior to your cardiac cath. Please report to the drive up COVID test site at the medical arts building on 11/18/18 between 12:30-2:30pm  If you have labs (blood work) drawn today and your tests are completed normal, you will receive your results only by: Marland Kitchen MyChart Message (if you have MyChart) OR . A paper copy in the mail If you have any lab test that is abnormal or we need to change your treatment, we will call you to review the results.  Testing/Procedures: Your physician has requested that you have a cardiac catheterization. Cardiac catheterization is used to diagnose and/or treat various heart conditions. Doctors may recommend this procedure for a number of different reasons. The most common reason is to evaluate chest pain. Chest pain can be a symptom of coronary artery disease (CAD), and cardiac catheterization can show whether plaque is narrowing or blocking your heart's arteries. This procedure is also used to evaluate the valves, as well as measure the blood flow and oxygen levels in different parts of your heart. For further information please visit [email or web address]. Please follow instruction sheet, as given.    Follow-Up: At [facility], you and your health needs are our priority.  As part of our continuing mission to provide you with exceptional heart care, we have created designated Provider Care Teams.  These Care Teams include your primary Cardiologist (physician) and Advanced Practice Providers (APPs -  Physician Assistants and Nurse Practitioners) who all work together to provide you with the care you need, when you need  it. You will need a follow up appointment in 2 weeks.   You may see Nelva Bush, MD or one of the following Advanced Practice Providers on your designated Care Team:   Murray Hodgkins, NP Christell Faith, PA-C . Marrianne Mood, PA-C  Any Other Special Instructions Will Be Listed Below (If Applicable).  [facility] Cardiac Cath Instructions   You are scheduled for a Cardiac Cath on:_8/25/20________________________  Please arrive at ___8:30____am on the day of your procedure  Please expect a call from our [facility] to pre-register you  Do not eat/drink anything after midnight  Someone will need to drive you home  It is recommended someone be with you for the first 24 hours after your procedure  Wear clothes that are easy to get on/off and wear slip on shoes if possible   Medications bring a current list of all medications with you  _x__ You may take all of your medications the morning of your procedure with enough water to swallow safely  _X__ Do not take these medications before your procedure:___Eliquis 48 hours prior last dose of Eliquis should be taken on the evening of 8/2220____________________________________________________________________________________________________________________________________________________________________________________________________________   Day of your procedure: Arrive at the [facility] entrance.  Free valet service is available.  After entering the [facility] please check-in at the registration desk (1st desk on your right) to receive your armband. After receiving your armband someone will escort you to the cardiac cath/special procedures waiting area.  The usual length of stay after your procedure is about 2 to 3 hours.  This can vary.  If you have any  questions, please call our office at [phone], or you may call the cardiac cath lab at [facility] directly at [phone]

## 2018-11-16 NOTE — Telephone Encounter (Signed)
Called the patient and lmtcb. Patient was seen by Ignacia Bayley, NP. After review of the patients chart Gerald Stabs would like the patient to reduce Carvedilol to 6.25mg  twice daily. An Rx has been sent to the patient's mail order pharmacy.

## 2018-11-16 NOTE — Telephone Encounter (Signed)
Spoke with the patient. Pt made aware of Mark Bayley, NP recommendation to reduce Carvedilol to 6.25mg  twice daily. Adv the pt that he can take 1/2 of his 12.5mg  tabs twice daily and an Rx has been sent to his mail order pharmacy. Patient verbalized understanding.

## 2018-11-17 ENCOUNTER — Telehealth: Payer: Self-pay | Admitting: *Deleted

## 2018-11-17 DIAGNOSIS — E785 Hyperlipidemia, unspecified: Secondary | ICD-10-CM

## 2018-11-17 DIAGNOSIS — Z79899 Other long term (current) drug therapy: Secondary | ICD-10-CM

## 2018-11-17 LAB — COMPREHENSIVE METABOLIC PANEL
ALT: 9 IU/L (ref 0–44)
AST: 15 IU/L (ref 0–40)
Albumin/Globulin Ratio: 1.7 (ref 1.2–2.2)
Albumin: 4.3 g/dL (ref 3.7–4.7)
Alkaline Phosphatase: 55 IU/L (ref 39–117)
BUN/Creatinine Ratio: 8 — ABNORMAL LOW (ref 10–24)
BUN: 10 mg/dL (ref 8–27)
Bilirubin Total: 0.8 mg/dL (ref 0.0–1.2)
CO2: 24 mmol/L (ref 20–29)
Calcium: 9.4 mg/dL (ref 8.6–10.2)
Chloride: 101 mmol/L (ref 96–106)
Creatinine, Ser: 1.19 mg/dL (ref 0.76–1.27)
GFR calc Af Amer: 68 mL/min/{1.73_m2} (ref >59)
GFR calc non Af Amer: 59 mL/min/{1.73_m2} — ABNORMAL LOW (ref >59)
Globulin, Total: 2.6 g/dL (ref 1.5–4.5)
Glucose: 116 mg/dL — ABNORMAL HIGH (ref 65–99)
Potassium: 4.5 mmol/L (ref 3.5–5.2)
Sodium: 141 mmol/L (ref 134–144)
Total Protein: 6.9 g/dL (ref 6.0–8.5)

## 2018-11-17 LAB — CBC WITH DIFFERENTIAL/PLATELET
Basophils Absolute: 0 10*3/uL (ref 0.0–0.2)
Basos: 1 %
EOS (ABSOLUTE): 0.2 10*3/uL (ref 0.0–0.4)
Eos: 3 %
Hematocrit: 36.1 % — ABNORMAL LOW (ref 37.5–51.0)
Hemoglobin: 11.9 g/dL — ABNORMAL LOW (ref 13.0–17.7)
Immature Grans (Abs): 0 10*3/uL (ref 0.0–0.1)
Immature Granulocytes: 0 %
Lymphocytes Absolute: 1.3 10*3/uL (ref 0.7–3.1)
Lymphs: 26 %
MCH: 28.7 pg (ref 26.6–33.0)
MCHC: 33 g/dL (ref 31.5–35.7)
MCV: 87 fL (ref 79–97)
Monocytes Absolute: 0.5 10*3/uL (ref 0.1–0.9)
Monocytes: 10 %
Neutrophils Absolute: 3 10*3/uL (ref 1.4–7.0)
Neutrophils: 60 %
Platelets: 194 10*3/uL (ref 150–450)
RBC: 4.14 x10E6/uL (ref 4.14–5.80)
RDW: 13.5 % (ref 11.6–15.4)
WBC: 5 10*3/uL (ref 3.4–10.8)

## 2018-11-17 LAB — LIPID PANEL
Chol/HDL Ratio: 3.3 ratio (ref 0.0–5.0)
Cholesterol, Total: 182 mg/dL (ref 100–199)
HDL: 56 mg/dL (ref >39)
LDL Calculated: 102 mg/dL — ABNORMAL HIGH (ref 0–99)
Triglycerides: 120 mg/dL (ref 0–149)
VLDL Cholesterol Cal: 24 mg/dL (ref 5–40)

## 2018-11-17 MED ORDER — ATORVASTATIN CALCIUM 80 MG PO TABS: 80.0000 mg | ORAL_TABLET | Freq: Every day | ORAL | 3 refills | Status: DC

## 2018-11-17 NOTE — Telephone Encounter (Signed)
Results called to pt. Pt verbalized understanding. He is agreeable to stop Pravachol and start atorvastatin 80 mg once a day. He is aware to go to the [facility] the week of October 5th for repeat fasting lab work. Rx sent to pharmacy.

## 2018-11-17 NOTE — Telephone Encounter (Signed)
-----   Message from Theora Gianotti, NP sent at 11/17/2018 11:46 AM EDT ----- Labs stable for cath.  LDL is not at goal on pravachol.  I'd like to change to atorvastatin 80mg  daily, if he'd be willing.  If so, will need f/u lipids/lft's in 6 wks.

## 2018-11-18 ENCOUNTER — Other Ambulatory Visit
Admission: RE | Admit: 2018-11-18 | Discharge: 2018-11-18 | Disposition: A | Discharge status: Home / Self Care | Payer: Medicare Other | Source: Ambulatory Visit | Attending: Internal Medicine | Admitting: Internal Medicine

## 2018-11-18 ENCOUNTER — Other Ambulatory Visit: Payer: Self-pay

## 2018-11-18 DIAGNOSIS — Z20828 Contact with and (suspected) exposure to other viral communicable diseases: Secondary | ICD-10-CM | POA: Insufficient documentation

## 2018-11-18 DIAGNOSIS — Z01812 Encounter for preprocedural laboratory examination: Secondary | ICD-10-CM | POA: Diagnosis not present

## 2018-11-18 LAB — SARS CORONAVIRUS 2 (TAT 6-24 HRS): SARS Coronavirus 2: NEGATIVE

## 2018-11-18 NOTE — Telephone Encounter (Signed)
LVM for return call. May have to adjust implant date.

## 2018-11-18 NOTE — Telephone Encounter (Signed)
Called patient. He said someone had called named Cecille Rubin or Lorren about his defibrillator next week. Advised it was probably Lorren in [place]. Advised I will route to her to reach out to him when she is able. Apologized for all the calling. He was very polite and understanding.

## 2018-11-18 NOTE — Telephone Encounter (Signed)
Pre procedure instructions have been reviewed with pt. Procedure date and follow up dates/times have been reviewed and agreed upon. Letter has been sent via [place] [facility] [place].   Pt had no additional questions.

## 2018-11-18 NOTE — Telephone Encounter (Signed)
Follow up  Patient is returning call. Please give patient a call back.  

## 2018-11-22 ENCOUNTER — Ambulatory Visit
Admission: RE | Admit: 2018-11-22 | Discharge: 2018-11-23 | Disposition: A | Discharge status: Home / Self Care | Payer: Medicare Other | Attending: Internal Medicine | Admitting: Internal Medicine

## 2018-11-22 ENCOUNTER — Encounter
Admission: RE | Disposition: A | Discharge status: Home / Self Care | Payer: Medicare Other | Source: Home / Self Care | Attending: Internal Medicine

## 2018-11-22 ENCOUNTER — Other Ambulatory Visit: Payer: Self-pay

## 2018-11-22 DIAGNOSIS — I4819 Other persistent atrial fibrillation: Secondary | ICD-10-CM | POA: Insufficient documentation

## 2018-11-22 DIAGNOSIS — I25110 Atherosclerotic heart disease of native coronary artery with unstable angina pectoris: Secondary | ICD-10-CM | POA: Diagnosis not present

## 2018-11-22 DIAGNOSIS — Z9889 Other specified postprocedural states: Secondary | ICD-10-CM | POA: Diagnosis not present

## 2018-11-22 DIAGNOSIS — Z85038 Personal history of other malignant neoplasm of large intestine: Secondary | ICD-10-CM | POA: Insufficient documentation

## 2018-11-22 DIAGNOSIS — Z7901 Long term (current) use of anticoagulants: Secondary | ICD-10-CM | POA: Insufficient documentation

## 2018-11-22 DIAGNOSIS — E785 Hyperlipidemia, unspecified: Secondary | ICD-10-CM | POA: Diagnosis not present

## 2018-11-22 DIAGNOSIS — I25710 Atherosclerosis of autologous vein coronary artery bypass graft(s) with unstable angina pectoris: Secondary | ICD-10-CM | POA: Diagnosis not present

## 2018-11-22 DIAGNOSIS — Z79899 Other long term (current) drug therapy: Secondary | ICD-10-CM | POA: Diagnosis not present

## 2018-11-22 DIAGNOSIS — I359 Nonrheumatic aortic valve disorder, unspecified: Secondary | ICD-10-CM | POA: Diagnosis present

## 2018-11-22 DIAGNOSIS — I200 Unstable angina: Secondary | ICD-10-CM | POA: Diagnosis present

## 2018-11-22 DIAGNOSIS — I255 Ischemic cardiomyopathy: Secondary | ICD-10-CM | POA: Diagnosis not present

## 2018-11-22 DIAGNOSIS — I10 Essential (primary) hypertension: Secondary | ICD-10-CM | POA: Diagnosis present

## 2018-11-22 DIAGNOSIS — D649 Anemia, unspecified: Secondary | ICD-10-CM | POA: Insufficient documentation

## 2018-11-22 DIAGNOSIS — I5022 Chronic systolic (congestive) heart failure: Secondary | ICD-10-CM | POA: Diagnosis not present

## 2018-11-22 DIAGNOSIS — I25119 Atherosclerotic heart disease of native coronary artery with unspecified angina pectoris: Secondary | ICD-10-CM | POA: Diagnosis present

## 2018-11-22 DIAGNOSIS — I252 Old myocardial infarction: Secondary | ICD-10-CM | POA: Insufficient documentation

## 2018-11-22 DIAGNOSIS — K219 Gastro-esophageal reflux disease without esophagitis: Secondary | ICD-10-CM | POA: Diagnosis not present

## 2018-11-22 DIAGNOSIS — I110 Hypertensive heart disease with heart failure: Secondary | ICD-10-CM | POA: Insufficient documentation

## 2018-11-22 DIAGNOSIS — Z95810 Presence of automatic (implantable) cardiac defibrillator: Secondary | ICD-10-CM | POA: Diagnosis not present

## 2018-11-22 DIAGNOSIS — R079 Chest pain, unspecified: Secondary | ICD-10-CM

## 2018-11-22 HISTORY — PX: CORONARY STENT INTERVENTION: CATH118234

## 2018-11-22 HISTORY — PX: LEFT HEART CATH AND CORS/GRAFTS ANGIOGRAPHY: CATH118250

## 2018-11-22 LAB — POCT ACTIVATED CLOTTING TIME
Activated Clotting Time: 219 seconds
Activated Clotting Time: 241 seconds
Activated Clotting Time: 279 seconds

## 2018-11-22 SURGERY — LEFT HEART CATH AND CORS/GRAFTS ANGIOGRAPHY
Anesthesia: Moderate Sedation

## 2018-11-22 MED ORDER — VERAPAMIL HCL 2.5 MG/ML IV SOLN
INTRAVENOUS | Status: DC | PRN
  Administered 2018-11-22: 2.5 mg via INTRA_ARTERIAL

## 2018-11-22 MED ORDER — CLOPIDOGREL BISULFATE 300 MG PO TABS
ORAL_TABLET | ORAL | Status: AC
  Filled 2018-11-22: qty 2

## 2018-11-22 MED ORDER — SODIUM CHLORIDE 0.9 % IV SOLN
INTRAVENOUS | Status: DC
  Administered 2018-11-22: 1000 mL via INTRAVENOUS

## 2018-11-22 MED ORDER — VERAPAMIL HCL 2.5 MG/ML IV SOLN
INTRAVENOUS | Status: AC
  Filled 2018-11-22: qty 2

## 2018-11-22 MED ORDER — CLOPIDOGREL BISULFATE 75 MG PO TABS
ORAL_TABLET | ORAL | Status: DC | PRN
  Administered 2018-11-22: 600 mg via ORAL

## 2018-11-22 MED ORDER — RIVAROXABAN 20 MG PO TABS: 20.0000 mg | ORAL_TABLET | Freq: Every day | ORAL | Status: DC

## 2018-11-22 MED ORDER — HEPARIN SODIUM (PORCINE) 1000 UNIT/ML IJ SOLN
INTRAMUSCULAR | Status: AC
  Filled 2018-11-22: qty 1

## 2018-11-22 MED ORDER — HEPARIN SODIUM (PORCINE) 1000 UNIT/ML IJ SOLN
INTRAMUSCULAR | Status: DC | PRN
  Administered 2018-11-22: 4000 [IU] via INTRAVENOUS
  Administered 2018-11-22: 3000 [IU] via INTRAVENOUS
  Administered 2018-11-22: 3500 [IU] via INTRAVENOUS
  Administered 2018-11-22: 3000 [IU] via INTRAVENOUS

## 2018-11-22 MED ORDER — FUROSEMIDE 40 MG PO TABS
40.0000 mg | ORAL_TABLET | Freq: Every day | ORAL | Status: DC
  Administered 2018-11-22: 40 mg via ORAL
  Filled 2018-11-22 (×2): qty 1

## 2018-11-22 MED ORDER — NITROGLYCERIN 1 MG/10 ML FOR IR/CATH LAB
INTRA_ARTERIAL | Status: DC | PRN
  Administered 2018-11-22: 200 ug via INTRACORONARY

## 2018-11-22 MED ORDER — HEPARIN (PORCINE) IN NACL 1000-0.9 UT/500ML-% IV SOLN
INTRAVENOUS | Status: DC | PRN
  Administered 2018-11-22 (×2): 500 mL

## 2018-11-22 MED ORDER — SODIUM CHLORIDE 0.9% FLUSH: 3.0000 mL | INTRAVENOUS | Status: DC | PRN

## 2018-11-22 MED ORDER — SODIUM CHLORIDE 0.9 % IV SOLN: 250.0000 mL | INTRAVENOUS | Status: DC | PRN

## 2018-11-22 MED ORDER — HEPARIN (PORCINE) IN NACL 1000-0.9 UT/500ML-% IV SOLN
INTRAVENOUS | Status: AC
  Filled 2018-11-22: qty 1000

## 2018-11-22 MED ORDER — ASPIRIN 81 MG PO CHEW
81.0000 mg | CHEWABLE_TABLET | Freq: Every day | ORAL | Status: DC
  Administered 2018-11-23: 09:00:00 81 mg via ORAL
  Filled 2018-11-22: qty 1

## 2018-11-22 MED ORDER — FENTANYL CITRATE (PF) 100 MCG/2ML IJ SOLN
INTRAMUSCULAR | Status: AC
  Filled 2018-11-22: qty 2

## 2018-11-22 MED ORDER — ATORVASTATIN CALCIUM 80 MG PO TABS
80.0000 mg | ORAL_TABLET | Freq: Every day | ORAL | Status: DC
  Administered 2018-11-23: 80 mg via ORAL
  Filled 2018-11-22: qty 1

## 2018-11-22 MED ORDER — CARVEDILOL 6.25 MG PO TABS
6.2500 mg | ORAL_TABLET | Freq: Two times a day (BID) | ORAL | Status: DC
  Administered 2018-11-22 – 2018-11-23 (×2): 6.25 mg via ORAL
  Filled 2018-11-22 (×2): qty 1

## 2018-11-22 MED ORDER — ASPIRIN 81 MG PO CHEW
81.0000 mg | CHEWABLE_TABLET | ORAL | Status: AC
  Administered 2018-11-22: 81 mg via ORAL

## 2018-11-22 MED ORDER — LOPERAMIDE HCL 2 MG PO CAPS
2.0000 mg | ORAL_CAPSULE | ORAL | Status: DC | PRN
  Administered 2018-11-22: 2 mg via ORAL
  Filled 2018-11-22: qty 1

## 2018-11-22 MED ORDER — ACETAMINOPHEN 325 MG PO TABS: 650.0000 mg | ORAL_TABLET | ORAL | Status: DC | PRN

## 2018-11-22 MED ORDER — HYDRALAZINE HCL 20 MG/ML IJ SOLN: 10.0000 mg | INTRAMUSCULAR | Status: AC | PRN

## 2018-11-22 MED ORDER — SODIUM CHLORIDE 0.9% FLUSH
3.0000 mL | Freq: Two times a day (BID) | INTRAVENOUS | Status: DC
  Administered 2018-11-22 – 2018-11-23 (×2): 3 mL via INTRAVENOUS

## 2018-11-22 MED ORDER — ISOSORBIDE MONONITRATE ER 30 MG PO TB24
30.0000 mg | ORAL_TABLET | Freq: Every day | ORAL | Status: DC
  Administered 2018-11-23: 30 mg via ORAL
  Filled 2018-11-22: qty 1

## 2018-11-22 MED ORDER — CLOPIDOGREL BISULFATE 75 MG PO TABS
75.0000 mg | ORAL_TABLET | Freq: Every day | ORAL | Status: DC
  Administered 2018-11-23: 09:00:00 75 mg via ORAL
  Filled 2018-11-22: qty 1

## 2018-11-22 MED ORDER — DOFETILIDE 250 MCG PO CAPS
250.0000 ug | ORAL_CAPSULE | Freq: Two times a day (BID) | ORAL | Status: DC
  Administered 2018-11-22 – 2018-11-23 (×2): 250 ug via ORAL
  Filled 2018-11-22 (×3): qty 1

## 2018-11-22 MED ORDER — NITROGLYCERIN 5 MG/ML IV SOLN
INTRAVENOUS | Status: AC
  Filled 2018-11-22: qty 10

## 2018-11-22 MED ORDER — MIDAZOLAM HCL 2 MG/2ML IJ SOLN
INTRAMUSCULAR | Status: AC
  Filled 2018-11-22: qty 2

## 2018-11-22 MED ORDER — LOSARTAN POTASSIUM 50 MG PO TABS
50.0000 mg | ORAL_TABLET | Freq: Every day | ORAL | Status: DC
  Administered 2018-11-23: 50 mg via ORAL
  Filled 2018-11-22: qty 1

## 2018-11-22 MED ORDER — NITROGLYCERIN 0.4 MG SL SUBL: 0.4000 mg | SUBLINGUAL_TABLET | SUBLINGUAL | Status: DC | PRN

## 2018-11-22 MED ORDER — SODIUM CHLORIDE 0.9% FLUSH
3.0000 mL | Freq: Two times a day (BID) | INTRAVENOUS | Status: DC
  Administered 2018-11-22 – 2018-11-23 (×2): 3 mL via INTRAVENOUS

## 2018-11-22 MED ORDER — FENTANYL CITRATE (PF) 100 MCG/2ML IJ SOLN
INTRAMUSCULAR | Status: DC | PRN
  Administered 2018-11-22 (×2): 25 ug via INTRAVENOUS

## 2018-11-22 MED ORDER — MIDAZOLAM HCL 2 MG/2ML IJ SOLN
INTRAMUSCULAR | Status: DC | PRN
  Administered 2018-11-22 (×2): 1 mg via INTRAVENOUS

## 2018-11-22 MED ORDER — IOHEXOL 300 MG/ML  SOLN
INTRAMUSCULAR | Status: DC | PRN
  Administered 2018-11-22: 125 mL via INTRA_ARTERIAL

## 2018-11-22 MED ORDER — ASPIRIN 81 MG PO CHEW
CHEWABLE_TABLET | ORAL | Status: AC
  Administered 2018-11-22: 09:00:00
  Filled 2018-11-22: qty 1

## 2018-11-22 SURGICAL SUPPLY — 15 items
BALLN EUPHORA RX 2.0X12 (BALLOONS) ×4
BALLOON EUPHORA RX 2.0X12 (BALLOONS) ×2 IMPLANT
CATH INFINITI 5 FR IM (CATHETERS) ×4 IMPLANT
CATH INFINITI 5 FR MPA2 (CATHETERS) ×4 IMPLANT
CATH INFINITI 5FR AL1 (CATHETERS) ×4 IMPLANT
CATH VISTA GUIDE 6FR MPA1 (CATHETERS) ×4 IMPLANT
DEVICE INFLAT 30 PLUS (MISCELLANEOUS) ×4 IMPLANT
DEVICE RAD TR BAND REGULAR (VASCULAR PRODUCTS) ×4 IMPLANT
GLIDESHEATH SLEND SS 6F .021 (SHEATH) ×4 IMPLANT
KIT MANI 3VAL PERCEP (MISCELLANEOUS) ×4 IMPLANT
PACK CARDIAC CATH (CUSTOM PROCEDURE TRAY) ×4 IMPLANT
STENT RESOLUTE ONYX 2.75X26 (Permanent Stent) ×4 IMPLANT
WIRE HITORQ VERSACORE ST 145CM (WIRE) ×4 IMPLANT
WIRE ROSEN-J .035X260CM (WIRE) ×4 IMPLANT
WIRE RUNTHROUGH .014X180CM (WIRE) ×4 IMPLANT

## 2018-11-22 NOTE — Interval H&P Note (Signed)
History and Physical Interval Note:  11/22/2018 8:58 AM  Mark Clayton  has presented today for cardiac catheterization, with the diagnosis of unstable angina.  The various methods of treatment have been discussed with the patient and family. After consideration of risks, benefits and other options for treatment, the patient has consented to  Procedure(s): LEFT HEART CATH AND CORONARY ANGIOGRAPHY (Left) as a surgical intervention.  The patient's history has been reviewed, patient examined, no change in status, stable for surgery.  I have reviewed the patient's chart and labs.  Questions were answered to the patient's satisfaction.    Cath Lab Visit (complete for each Cath Lab visit)  Clinical Evaluation Leading to the Procedure:   ACS: No.  Non-ACS:    Anginal Classification: CCS IV  Anti-ischemic medical therapy: Maximal Therapy (2 or more classes of medications)  Non-Invasive Test Results: No non-invasive testing performed  Prior CABG: Previous CABG  Mark Clayton

## 2018-11-22 NOTE — Brief Op Note (Signed)
BRIEF CARDIAC CATHETERIZATION NOTE  11/22/2018  12:10 PM  PATIENT:  Mark Clayton  78 y.o. male  PRE-OPERATIVE DIAGNOSIS:  Unstable angina  POST-OPERATIVE DIAGNOSIS:  Same  PROCEDURE:  Procedure(s) with comments: LEFT HEART CATH AND CORS/GRAFTS ANGIOGRAPHY (Left) CORONARY STENT INTERVENTION (N/A) - SVG - RCA  SURGEON:  Surgeon(s) and Role:    * Shon Indelicato, Harrell Gave, MD - Primary  FINDINGS: 1. Severe native CAD, including occluded mid LAD, mid LCx, and proximal rPDA. 2. Patent LIMA-LAD with moderate diffuse disease of distal LAD. 3. Widely patent sequential SVG to ramus, OM1, and OM2. 4. Sequential SVG to rPDA is patent with 95% stenosis at anastomosis with rPDA.  Jump portion to rPL is chronically occluded. 5. Patent mid RCA stent. 6. Moderately elevated left ventricular filling pressure (LVEDP 25-30 mmHg). 7. Successful PCI to SVG-rPDA (distal anastomosis) using Resolute 2.75 x 26 mm drug-eluting stent with 0% residual stenosis and TIMI-3 flow.  RECOMMENDATIONS: 1. Overnight extended recovery. 2. If no evidence of bleeding at left radial arteriotomy side, restart rivaroxaban 20 mg daily tomorrow.  Recommend rivaroxaban + clopidogrel for at least 6 months, ideally longer. 3. Gentle diuresis. 4. Continue aggressive secondary prevention.  Nelva Bush, MD [facility] Pager: [phone]

## 2018-11-23 ENCOUNTER — Telehealth: Payer: Self-pay | Admitting: Nurse Practitioner

## 2018-11-23 ENCOUNTER — Encounter: Payer: Self-pay | Admitting: Internal Medicine

## 2018-11-23 ENCOUNTER — Other Ambulatory Visit: Payer: Self-pay | Admitting: Physician Assistant

## 2018-11-23 DIAGNOSIS — I420 Dilated cardiomyopathy: Secondary | ICD-10-CM

## 2018-11-23 DIAGNOSIS — I480 Paroxysmal atrial fibrillation: Secondary | ICD-10-CM | POA: Diagnosis not present

## 2018-11-23 DIAGNOSIS — I252 Old myocardial infarction: Secondary | ICD-10-CM | POA: Diagnosis not present

## 2018-11-23 DIAGNOSIS — I25110 Atherosclerotic heart disease of native coronary artery with unstable angina pectoris: Secondary | ICD-10-CM

## 2018-11-23 DIAGNOSIS — I255 Ischemic cardiomyopathy: Secondary | ICD-10-CM | POA: Diagnosis not present

## 2018-11-23 DIAGNOSIS — K219 Gastro-esophageal reflux disease without esophagitis: Secondary | ICD-10-CM | POA: Diagnosis not present

## 2018-11-23 DIAGNOSIS — I10 Essential (primary) hypertension: Secondary | ICD-10-CM | POA: Diagnosis not present

## 2018-11-23 DIAGNOSIS — I25119 Atherosclerotic heart disease of native coronary artery with unspecified angina pectoris: Secondary | ICD-10-CM

## 2018-11-23 DIAGNOSIS — I4819 Other persistent atrial fibrillation: Secondary | ICD-10-CM | POA: Diagnosis not present

## 2018-11-23 DIAGNOSIS — I200 Unstable angina: Secondary | ICD-10-CM | POA: Diagnosis not present

## 2018-11-23 DIAGNOSIS — I110 Hypertensive heart disease with heart failure: Secondary | ICD-10-CM | POA: Diagnosis not present

## 2018-11-23 DIAGNOSIS — D649 Anemia, unspecified: Secondary | ICD-10-CM | POA: Diagnosis not present

## 2018-11-23 DIAGNOSIS — E785 Hyperlipidemia, unspecified: Secondary | ICD-10-CM | POA: Diagnosis not present

## 2018-11-23 DIAGNOSIS — Z85038 Personal history of other malignant neoplasm of large intestine: Secondary | ICD-10-CM | POA: Diagnosis not present

## 2018-11-23 DIAGNOSIS — Z95810 Presence of automatic (implantable) cardiac defibrillator: Secondary | ICD-10-CM | POA: Diagnosis not present

## 2018-11-23 DIAGNOSIS — I5022 Chronic systolic (congestive) heart failure: Secondary | ICD-10-CM | POA: Diagnosis not present

## 2018-11-23 DIAGNOSIS — E782 Mixed hyperlipidemia: Secondary | ICD-10-CM | POA: Diagnosis not present

## 2018-11-23 DIAGNOSIS — Z7901 Long term (current) use of anticoagulants: Secondary | ICD-10-CM | POA: Diagnosis not present

## 2018-11-23 DIAGNOSIS — Z79899 Other long term (current) drug therapy: Secondary | ICD-10-CM | POA: Diagnosis not present

## 2018-11-23 LAB — BASIC METABOLIC PANEL
Anion gap: 7 (ref 5–15)
BUN: 13 mg/dL (ref 8–23)
CO2: 29 mmol/L (ref 22–32)
Calcium: 9 mg/dL (ref 8.9–10.3)
Chloride: 103 mmol/L (ref 98–111)
Creatinine, Ser: 1.22 mg/dL (ref 0.61–1.24)
GFR calc Af Amer: >60 mL/min (ref >60)
GFR calc non Af Amer: 57 mL/min — ABNORMAL LOW (ref >60)
Glucose, Bld: 109 mg/dL — ABNORMAL HIGH (ref 70–99)
Potassium: 3.9 mmol/L (ref 3.5–5.1)
Sodium: 139 mmol/L (ref 135–145)

## 2018-11-23 LAB — CBC
HCT: 37.6 % — ABNORMAL LOW (ref 39.0–52.0)
Hemoglobin: 12.1 g/dL — ABNORMAL LOW (ref 13.0–17.0)
MCH: 28.3 pg (ref 26.0–34.0)
MCHC: 32.2 g/dL (ref 30.0–36.0)
MCV: 88.1 fL (ref 80.0–100.0)
Platelets: 162 10*3/uL (ref 150–400)
RBC: 4.27 MIL/uL (ref 4.22–5.81)
RDW: 13.6 % (ref 11.5–15.5)
WBC: 5.2 10*3/uL (ref 4.0–10.5)
nRBC: 0 % (ref 0.0–0.2)

## 2018-11-23 MED ORDER — CLOPIDOGREL BISULFATE 75 MG PO TABS: 75.0000 mg | ORAL_TABLET | Freq: Every day | ORAL | 3 refills | Status: DC

## 2018-11-23 MED ORDER — CLOPIDOGREL BISULFATE 75 MG PO TABS: 75.0000 mg | ORAL_TABLET | Freq: Every day | ORAL | 0 refills | Status: DC

## 2018-11-23 NOTE — Discharge Summary (Signed)
Discharge Summary    Patient ID: Mark Clayton MRN: [id number]; DOB: 11-Jun-1940  Admit date: 11/22/2018 Discharge date: 11/23/2018  Primary Care Provider: [name], MD  Primary Cardiologist: Nelva Bush, MD  Primary Electrophysiologist:  Dr. Caryl Comes   Discharge Diagnoses    Principal Problem:   Unstable angina [facility]) Active Problems:   Chronic systolic heart failure [facility])   Coronary artery disease involving native heart with angina pectoris [facility])   Cardiac defibrillator  MDT VVI   Implantable cardioverter-defibrillator (ICD) in situ   Ischemic cardiomyopathy   Persistent atrial fibrillation   Essential hypertension   Allergies Allergies  Allergen Reactions   Latex Rash   Tape Rash and Other (See Comments)    USE PAPER    Diagnostic Studies/Procedures    LHC 11/22/2018 Diagnostic Dominance: Right  Intervention    _Conclusions: 1. Multivessel coronary artery disease, as outlined below.  Culprit lesion for the patient's unstable angina is most likely the anastomotic lesion at the SVG to rPDA.  Otherwise, coronary and graft disease appears stable from prior catheterization in 2016. 2. Patent mid RCA stent with mild focal in-stent restenosis. 3. Widely patent LIMA to LAD. 4. Widely patent sequential SVG to ramus intermedius, OM2, and OM3. 5. Patent sequential SVG to RPDA with 95% focal stenosis at the side to side anastomosis of graft to rPDA.  Jump portion to rPL remains chronically occluded. 6. Moderately elevated left ventricular filling pressure. 7. Successful PCI to SVG to RPDA using Resolute Onyx 2.75 x 26 mm drug-eluting stent with 0% residual stenosis and TIMI-3 flow.  Recommendations: 1. Admit for overnight extended recovery. 2. Gentle diuresis, given moderately elevated left ventricular filling pressure. 3. Restart rivaroxaban 20 mg daily tomorrow if no evidence of bleeding.  Patient will need to continue on rivaroxaban and clopidogrel 75  mg daily for at least 6 to 12 months, as tolerated. Aggressive secondary prevention.____________   History of Present Illness     78 year old male with history of CAD s/p CABG in 2003 with subsequent stenting in 2016, ischemic cardiomyopathy s/p AICD, chronic systolic congestive heart failure, paroxysmal atrial fibrillation, hypertension, hyperlipidemia, colon cancer, and anemia. He underwent CABG x5 in 2003 after suffering a myocardial infarction.  He subsequently required placement of an AICD in the setting of ongoing ongoing left ventricular dysfunction ischemic cardiomyopathy, and October 2004.  In June 2016, he underwent drug-eluting stent placement to the vein graft  RPDA  RPLB.  His last echocardiogram in June 2019 showed persistent left ventricular dysfunction with an EF of 20 to 25%, diffuse hypokinesis, and grade 3 diastolic dysfunction.  In the setting of paroxysmal atrial fibrillation, he has been maintained on dofetilide therapy, and he has been anticoagulated with Xarelto in the setting of CHA2DS2VASc score of at least  5.   He had been followed by electrophysiology with remote device checks regularly.  At his 11/16/2018 appointment, it was noted that he was at the point with his device where he would be due for device upgrade secondary to elective replacement indicators. He was mailed a 4-day ZIO monitor early August 2020 to assess for chronotropic excursion and determine whether or not he would require an atrial lead placement and CRT upgrade. He was scheduled for ICD upgrade on August 31.    Also at his 11/16/2018 visit, he stated he had been experiencing exertional chest pressure and tightness, as well as dyspnea over the past 3+ months.  He noted it has been happening up to 5 times  per week and required nitroglycerin 1 or 2 times per week.  Over the few weeks leading up to his 11/16/18 appointment, he had noted several episodes of nocturnal angina, which had required nitroglycerin.  He  stated symptoms typically lasted less than 5 minutes and were associated with dyspnea.  Symptoms were reminiscent of angina he experienced prior to stenting of his vein graft in 2016, and he said that he felt like he needed another stent.  Unfortunately, due to soft blood pressure when he took his isosorbide during the day, further titration of long-acting nitrates was not feasible at that time.  Treatment options were discussed with decision for diagnostic outpatient catheterization.  He was scheduled for outpatient catheterization on 11/22/2018.  The patient was instructed to hold Xarelto for 48 hours prior to catheterization.  No aspirin in the setting of chronic Xarelto.  He continued his beta-blocker, ARB, and statin.  Hospital Course     On 11/22/2018, the patient underwent diagnostic left heart catheterization and coronary angiography with subsequent percutaneous intervention.  Findings during catheterization were of severe native CAD, including occluded mid LAD, mid left circumflex, and proximal  rPDA.  He had a patent LIMA to LAD with moderate diffuse disease of the distal LAD.  Widely patent sequential SVG to ramus, OM1, and OM 2 were noted.  Sequential SVG to RPDA was patent with 95% stenosis at anastomoses with RPDA.  Jump portion to RPL was chronically occluded.  He had a patent mid RCA stent.  He also had moderately elevated left ventricular filling pressure with LVEDP 25 to 30 mmHg.  Successful PCI was performed to SVG to RPDA (distal anastomoses) using resolute 2.75 x 26 mm drug-eluting stent with 0% residual stenosis and TIMI-3 flow.  Recommendations were for overnight extended recovery at [facility] with gentle diuresis.  Today, 11/23/2018, the patient has been examined by MD and deemed stable for discharge.  He denied CP. Physical exam revealed no evidence of bleeding at left radial arteriotomy site. Labs have been reviewed and without significant findings. Vitals stable.  Medications and plan for  follow-up were reviewed with patient, including plan for Xarelto and clopidogrel for at least 6 months, ideally longer. Patient was instructed to avoid medicines like ibuprofen, Advil, Motrin, naproxen, and Aleve due to risk of stomach bleeding while on Plavix and Xarelto. He should continue his PPI. Asprin was not continued at discharge to avoid triple therapy. He was informed he may take Tylenol as directed or talk to his primary doctor about alternatives. He will start Plavix at this hospital before discharge this AM with refills sent to his local pharmacy. He should restart PTA Xarelto this evening. As above, no ASA at discharge. He should continue PTA Tikosyn, Lasix, Coreg, Lipitor, Imdur, losartan, and SL nitro for CP. He is aware that if taking his third dose of SL nitro, he should call 911. He should continue his statin therapy for aggressive secondary prevention. He is aware that someone from the office should contact him at discharge to schedule follow-up with our office in the next 2 weeks, and he knows to call the office if he has not heard to schedule his appointment in 2 days. He is scheduled for cardiac rehab.   Consultants: None  _____________  Discharge Vitals Blood pressure 139/77, pulse 67, temperature 97.6 F (36.4 C), temperature source Oral, resp. rate 18, height 6' (1.829 m), weight 73.1 kg, SpO2 98 %.  Filed Weights   11/22/18 0846 11/23/18 0327  Weight: 73.9  kg 73.1 kg    Labs & Radiologic Studies    CBC Recent Labs    11/23/18 0514  WBC 5.2  HGB 12.1*  HCT 37.6*  MCV 88.1  PLT 0000000   Basic Metabolic Panel Recent Labs    11/23/18 0514  NA 139  K 3.9  CL 103  CO2 29  GLUCOSE 109*  BUN 13  CREATININE 1.22  CALCIUM 9.0   Liver Function Tests No results for input(s): AST, ALT, ALKPHOS, BILITOT, PROT, ALBUMIN in the last 72 hours. No results for input(s): LIPASE, AMYLASE in the last 72 hours. Cardiac Enzymes No results for input(s): CKTOTAL, CKMB,  CKMBINDEX, TROPONINI in the last 72 hours. BNP Invalid input(s): POCBNP D-Dimer No results for input(s): DDIMER in the last 72 hours. Hemoglobin A1C No results for input(s): HGBA1C in the last 72 hours. Fasting Lipid Panel No results for input(s): CHOL, HDL, LDLCALC, TRIG, CHOLHDL, LDLDIRECT in the last 72 hours. Thyroid Function Tests No results for input(s): TSH, T4TOTAL, T3FREE, THYROIDAB in the last 72 hours.  Invalid input(s): FREET3 _____________  No results found. Disposition   Physical exam per MD. Pt is being discharged home today in good condition.  Follow-up Plans & Appointments    Follow-up Information    [facility] [facility] Follow up.   Specialty: Pulmonary Rehab Why: Your Cardiologist has referred you to outpatient Cardiac Rehab at [facility].  Per your discussion with Roanna Epley, RN, Cardiac Nurse Navigator, you have declined to participate in Cardiac Rehab, as you plan to continue exercising at home.   Contact information: [place] [id number] [address] [place] [phone]       Nelva Bush, MD Follow up.   Specialty: Cardiology Why: You should receive a call to schedule your follow-up appointment with [facility] and to occur within 2 weeks of discharge.  Please contact our office if you do not receive a call with in 1 to 2 days to schedule this appointment. Contact information: [place] [address] [phone]          Discharge Instructions    (HEART FAILURE PATIENTS) Call MD:  Anytime you have any of the following symptoms: 1) 3 pound weight gain in 24 hours or 5 pounds in 1 week 2) shortness of breath, with or without a dry hacking cough 3) swelling in the hands, feet or stomach 4) if you have to sleep on extra pillows at night in order to breathe.   Complete by: As directed    AMB Referral to Cardiac Rehabilitation - Phase II   Complete by: As directed     Diagnosis: Coronary Stents   After initial evaluation and assessments completed: Virtual Based Care may be provided alone or in conjunction with Phase 2 Cardiac Rehab based on patient barriers.: Yes   Call MD for:  difficulty breathing, headache or visual disturbances   Complete by: As directed    Call MD for:  extreme fatigue   Complete by: As directed    Call MD for:  hives   Complete by: As directed    Call MD for:  persistant dizziness or light-headedness   Complete by: As directed    Call MD for:  persistant nausea and vomiting   Complete by: As directed    Call MD for:  redness, tenderness, or signs of infection (pain, swelling, redness, odor or green/yellow discharge around incision site)   Complete by: As directed  Call MD for:  severe uncontrolled pain   Complete by: As directed    Call MD for:  temperature >100.4   Complete by: As directed    Diet - low sodium heart healthy   Complete by: As directed    Increase activity slowly   Complete by: As directed    Increase activity slowly   Complete by: As directed       Discharge Medications   Allergies as of 11/23/2018      Reactions   Latex Rash   Tape Rash, Other (See Comments)   USE PAPER      Medication List    TAKE these medications   atorvastatin 80 MG tablet Commonly known as: LIPITOR Take 1 tablet (80 mg total) by mouth daily.   carvedilol 6.25 MG tablet Commonly known as: COREG Take 1 tablet (6.25 mg total) by mouth 2 (two) times daily with a meal.   clopidogrel 75 MG tablet Commonly known as: PLAVIX Take 1 tablet (75 mg total) by mouth daily with breakfast. Start taking on: November 24, 2018   dofetilide 250 MCG capsule Commonly known as: TIKOSYN Take 1 capsule (250 mcg total) by mouth 2 (two) times daily.   furosemide 40 MG tablet Commonly known as: LASIX TAKE 1 TABLET BY MOUTH  DAILY   isosorbide mononitrate 30 MG 24 hr tablet Commonly known as: IMDUR TAKE 1 TABLET BY MOUTH  DAILY     losartan 50 MG tablet Commonly known as: COZAAR TAKE 1 TABLET BY MOUTH  DAILY   meclizine 25 MG tablet Commonly known as: ANTIVERT TAKE 1 TABLET BY MOUTH  DAILY AS NEEDED FOR  DIZZINESS. What changed: See the new instructions.   nitroGLYCERIN 0.4 MG SL tablet Commonly known as: NITROSTAT DISSOLVE 1 TABLET UNDER THE TONGUE EVERY 5 MINUTES AS  NEEDED FOR CHEST PAIN UP TO 3 DOSES. CALL 911 IF CHEST  PAIN PERSISTS What changed: See the new instructions.   pantoprazole 40 MG tablet Commonly known as: PROTONIX TAKE 1 TABLET BY MOUTH  DAILY   vitamin B-12 100 MCG tablet Commonly known as: CYANOCOBALAMIN Take 100 mcg by mouth daily.   Xarelto 20 MG Tabs tablet Generic drug: rivaroxaban TAKE 1 TABLET BY MOUTH  DAILY WITH SUPPER What changed: See the new instructions.        Acute coronary syndrome (MI, NSTEMI, STEMI, etc) this admission?: No.    Outstanding Labs/Studies   None. Follow-up labs to be performed at outpatient follow-up.  Duration of Discharge Encounter   Greater than 30 minutes including physician time.  Signed, Arvil Chaco, PA-C 11/23/2018, 11:11 AM

## 2018-11-23 NOTE — Telephone Encounter (Signed)
-----   Message from Arvil Chaco, PA-C sent at 11/23/2018 10:15 AM EDT ----- Regarding: TCM Hello,  This patient underwent catheterization at [facility] for unstable angina with PCI and admitted for overnight observation.  We are expecting discharge today 8/26.  Can you please call and arrange / schedule for follow-up TCM appointment with Dr. Saunders Revel or APP within the next two weeks?  Thank you! Signed, Arvil Chaco, PA-C 11/23/2018, 10:15 AM Pager [phone]

## 2018-11-23 NOTE — Progress Notes (Signed)
Pt discharged home via private car at 1245. Pt was A&Ox4. AVS reviewed with pt and all questions were answered. Pt verbalized understanding of discharge instructions. Pt left with clothes, shoes, cell phone, charger, glasses, partial dentures, and bag of personal belongings. Pt wheeled downstairs by unit Director.

## 2018-11-23 NOTE — Progress Notes (Addendum)
Progress Note  Patient Name: Mark Clayton Date of Encounter: 11/23/2018  Primary Cardiologist: Nelva Bush, MD   Subjective   Cath yesterday,  Successful PCI to SVG-rPDA (distal anastomosis) using Resolute 2.75 x 26 mm drug-eluting stent with 0% residual stenosis and TIMI-3 flow. -no complications  Inpatient Medications    Scheduled Meds: . aspirin  81 mg Oral Daily  . atorvastatin  80 mg Oral Daily  . carvedilol  6.25 mg Oral BID WC  . clopidogrel  75 mg Oral Q breakfast  . dofetilide  250 mcg Oral BID  . furosemide  40 mg Oral Daily  . isosorbide mononitrate  30 mg Oral Daily  . losartan  50 mg Oral Daily  . rivaroxaban  20 mg Oral Q supper  . sodium chloride flush  3 mL Intravenous Q12H  . sodium chloride flush  3 mL Intravenous Q12H   Continuous Infusions: . sodium chloride     PRN Meds: sodium chloride, acetaminophen, loperamide, nitroGLYCERIN, sodium chloride flush   Vital Signs    Vitals:   11/22/18 1700 11/22/18 1751 11/22/18 1945 11/23/18 0327  BP: (!) 132/119 (!) 151/98 (!) 154/92 (!) 141/78  Pulse: 70 69 76 67  Resp: (!) 21 18    Temp:  98.3 F (36.8 C) 98.5 F (36.9 C) 97.6 F (36.4 C)  TempSrc:  Oral Oral Oral  SpO2: 98% 99% 99% 98%  Weight:    73.1 kg  Height:        Intake/Output Summary (Last 24 hours) at 11/23/2018 0818 Last data filed at 11/22/2018 1857 Gross per 24 hour  Intake -  Output 825 ml  Net -825 ml   Last 3 Weights 11/23/2018 11/22/2018 11/16/2018  Weight (lbs) 161 lb 1.6 oz 163 lb 167 lb  Weight (kg) 73.074 kg 73.936 kg 75.751 kg      Telemetry    NSR - Personally Reviewed  ECG     - Personally Reviewed  Physical Exam   GEN: No acute distress.   Neck: No JVD Cardiac: RRR, no murmurs, rubs, or gallops.  Respiratory: Clear to auscultation bilaterally. GI: Soft, nontender, non-distended  MS: No edema; No deformity. Neuro:  Nonfocal  Psych: Normal affect   Labs    High Sensitivity Troponin:  No results  for input(s): TROPONINIHS in the last 720 hours.    Chemistry Recent Labs  Lab 11/16/18 0900 11/23/18 0514  NA 141 139  K 4.5 3.9  CL 101 103  CO2 24 29  GLUCOSE 116* 109*  BUN 10 13  CREATININE 1.19 1.22  CALCIUM 9.4 9.0  PROT 6.9  --   ALBUMIN 4.3  --   AST 15  --   ALT 9  --   ALKPHOS 55  --   BILITOT 0.8  --   GFRNONAA 59* 57*  GFRAA 68 >60  ANIONGAP  --  7     Hematology Recent Labs  Lab 11/16/18 0900 11/23/18 0514  WBC 5.0 5.2  RBC 4.14 4.27  HGB 11.9* 12.1*  HCT 36.1* 37.6*  MCV 87 88.1  MCH 28.7 28.3  MCHC 33.0 32.2  RDW 13.5 13.6  PLT 194 162    BNPNo results for input(s): BNP, PROBNP in the last 168 hours.   DDimer No results for input(s): DDIMER in the last 168 hours.   Radiology    No results found.  Cardiac Studies   Cath FINDINGS: 1. Severe native CAD, including occluded mid LAD, mid LCx, and proximal  rPDA. 2. Patent LIMA-LAD with moderate diffuse disease of distal LAD. 3. Widely patent sequential SVG to ramus, OM1, and OM2. 4. Sequential SVG to rPDA is patent with 95% stenosis at anastomosis with rPDA.  Jump portion to rPL is chronically occluded. 5. Patent mid RCA stent. 6. Moderately elevated left ventricular filling pressure (LVEDP 25-30 mmHg). 7. Successful PCI to SVG-rPDA (distal anastomosis) using Resolute 2.75 x 26 mm drug-eluting stent with 0% residual stenosis and TIMI-3 flow.  Patient Profile     78 y/o ? with a history of CAD status post CABG in 2003 with subsequent stenting in 2016, ischemic cardiomyopathy status post AICD, chronic systolic congestive heart failure, paroxysmal atrial fibrillation, hypertension, hyperlipidemia, colon cancer, and anemia,  exertional chest pressure/tightness and dyspnea over the past 3+ months, unstable angina, Presents for cardiac cath  Assessment & Plan   1.  Unstable angina/coronary artery disease: Cath yesterday with results as above Successful PCI to SVG-rPDA (distal anastomosis)  using Resolute 2.75 x 26 mm drug-eluting stent with 0% residual stenosis and TIMI-3 flow. Other grafts patent -Plan is to discharge on Plavix and Xarelto -Continue Coreg 6.25 twice daily, Imdur 30 daily, Sartin 50 daily Discharge planning discussed with him, restrictions on left arm discussed (radial access on left)  2. essential hypertension Reports systolic pressure at home 125 up to 130 Higher this hospital stay Will continue current medications for now, encourage close monitoring as outpatient  3.  Hyperlipidemia Cholesterol not at goal 1 week ago, LDL 102 He was changed to Lipitor 80 Consider adding Zetia as outpatient if needed  4.  Paroxysmal atrial fibrillation On Xarelto, Tikosyn Continue current doses, restart Xarelto this evening  5.  Cardiomyopathy Ejection fraction 25% in June 2019 Continue carvedilol, losartan, Imdur Consider repeat limited echo as outpt (post intervention) If ejection fraction continues to run low could change losartan to Entresto Could also add spironolactone   Total encounter time more than 35 minutes  Greater than 50% was spent in counseling and coordination of care with the patient    For questions or updates, please contact [place] Please consult [email or web address] for contact info under        Signed, Ida Rogue, MD  11/23/2018, 8:18 AM

## 2018-11-23 NOTE — Discharge Instructions (Signed)
You have received care from [facility] during this hospital stay and we look forward to continuing to provide you with excellent care in our office settings after you've left the hospital.  In order to assure a smoother transition to home following your discharge from the hospital, we will likely have you see one of our nurse practitioners or physician assistants within a few weeks of discharge.  Our advanced practice providers work closely with your physician in order to address all of your heart's needs in a timely manner.  More information about all of our providers may be found here: [email or web address]   Please plan to bring all of your prescriptions to your follow-up appointment and don't hesitate to contact us with questions or concerns.  [address] [place], [place] [address] --------------------------------------  CATHETERIZATION: NO HEAVY LIFTING X 4 WEEKS. NO SOAKING BATHS, HOT TUBS, POOLS, ETC., X 7 DAYS.   Radial Site Care Refer to this sheet in the next few weeks. These instructions provide you with information on caring for yourself after your procedure. Your caregiver may also give you more specific instructions. Your treatment has been planned according to current medical practices, but problems sometimes occur. Call your caregiver if you have any problems or questions after your procedure. HOME CARE INSTRUCTIONS  You may shower the day after the procedure. Remove the bandage (dressing) and gently wash the site with plain soap and water. Gently pat the site dry.   Do not apply powder or lotion to the site.   Do not submerge the affected site in water for 3 to 5 days.   Inspect the site at least twice daily.   Do not flex or bend the affected arm for 24 hours.   No lifting over 5 pounds (2.3 kg) for 5 days after your procedure.    What to expect:  Any bruising will usually fade within 1 to 2 weeks.   Blood that collects in the tissue (hematoma) may be painful to the touch. It should usually decrease in size and tenderness within 1 to 2 weeks.  SEEK IMMEDIATE MEDICAL CARE IF:  You have unusual pain at the radial site.   You have redness, warmth, swelling, or pain at the radial site.   You have drainage (other than a small amount of blood on the dressing).   You have chills.   You have a fever or persistent symptoms for more than 72 hours.   You have a fever and your symptoms suddenly get worse.   Your arm becomes pale, cool, tingly, or numb.   You have heavy bleeding from the site. Hold pressure on the site.   10 Habits of Highly Healthy [facility] wants to help you get well and stay well.  Live a longer, healthier life by practicing healthy habits every day.  1.  Visit your primary care provider regularly. 2.  Make time for family and friends.  Healthy relationships are important. 3.  Take medications as directed by your provider. 4.  Maintain a healthy weight and a trim waistline. 5.  Eat healthy meals and snacks, rich in fruits, vegetables, whole grains, and lean proteins. 6.  Get moving every day - aim for 150 minutes of moderate physical activity each week. 7.  Don't smoke. 8.  Avoid alcohol or drink in moderation. 9.  Manage stress through meditation or mindful relaxation. 10.  Get seven to  nine hours of quality sleep each night.  Want more information on healthy habits?  To learn more about these and other healthy habits, visit [employer].   1. Follow a low-salt diet - you are allowed no more than 2,000mg  of sodium per day. Watch your fluid intake. In general, you should not be taking in more than 2 liters of fluid per day (no more than 8 glasses per day). This includes sources of water in foods like soup, coffee, tea, milk, etc. 2. Weigh yourself on the same scale at  same time of day and keep a log. 3. Call your doctor: (Anytime you feel any of the following symptoms)  - 3lb weight gain overnight or 5lb within a few days - Shortness of breath, with or without a dry hacking cough  - Swelling in the hands, feet or stomach  - If you have to sleep on extra pillows at night in order to breathe   IT IS IMPORTANT TO LET YOUR DOCTOR KNOW EARLY ON IF YOU ARE HAVING SYMPTOMS SO WE CAN HELP YOU!  Patients taking blood thinners should generally stay away from medicines like ibuprofen, Advil, Motrin, naproxen, and Aleve due to risk of stomach bleeding. You may take Tylenol as directed or talk to your primary doctor about alternatives.

## 2018-11-23 NOTE — Progress Notes (Signed)
Patient requested Plavix 75mg  30d  to outpatient local pharmacy and Plavix 90d with 3 refills to mail in pharmacy.

## 2018-11-23 NOTE — Telephone Encounter (Signed)
Patient currently admitted at this time. 

## 2018-11-23 NOTE — Progress Notes (Signed)
Cardiovascular and Pulmonary Nurse Navigator Note:    78 year old male with hx of CAD - s/p CAGB in 2003 with subsequent stenting in 2016, ischemic cardiomyopathy s/p AICD, paroxysmal atrial fibrillation, HTN, HLD, colon cancer, and anemia who presented to [facility] for schedule outpatient Cardiac Cath yesterday due to unstable angina.  Patient s/p successful PCI to SVG-rPDA with DES.    EDUCATION:   Discussed modifiable risk factors including controlling blood pressure, cholesterol, and blood sugar; following heart healthy diet; maintaining healthy weight; exercise; and smoking cessation, if applicable.  Discussed cardiac medications including rationale for taking, mechanisms of action, and side effects. Stressed the importance of taking medications as prescribed.  Discussed emergency plan for heart attack symptoms. Patient verbalized understanding of need to call 911 and not to drive himself to ER if having cardiac symptoms / chest pain.   Diet of low sodium, low fat, low cholesterol heart healthy.  Patient reports following heart healthy diet.    Smoking Cessation - Patient is a NEVER smoker.      Exercise - Benefits of exercised discussed. Patient currently exercises, as he reports walking 20 miles per week.  Patient has already ambulated in the hallways this morning for a solid 30 minutes.  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  Informed patient his cardiologist has referred him to outpatient Cardiac Rehab. An overview of the program was provided.  Patient has declined to participate in Cardiac Rehab, as he plans to continue his walking plan at home.  Patient stated, "With everything I am currently doing I do not have time to come to Cardiac Rehab."    Patient appreciative of the information / review / discussion.    Roanna Epley, RN, BSN, [facility]  Cardiovascular & Pulmonary Nurse Navigator  Direct Line: [phone]  Department Phone #: [phone] Fax: [phone]  Email Address: [email or web address]

## 2018-11-23 NOTE — Plan of Care (Signed)

## 2018-11-23 NOTE — Telephone Encounter (Signed)
TCM....  Patient is being discharged 11/23/18  They saw Dr. Saunders Revel  They are scheduled to see Ignacia Bayley on 9/3  They were seen for CARDIAC CATHETERIZATION   They need to be seen within 1-2 weeks  Pt not placed on  wait list   Please call

## 2018-11-24 NOTE — Telephone Encounter (Signed)
As documented in cath note and discharge summary, the patient is NOT to be on aspirin.  He should take rivaroxaban 20 mg daily and clopidogrel 75 mg daily.  Thanks.  Nelva Bush, MD [facility] Pager: [phone]

## 2018-11-24 NOTE — Telephone Encounter (Signed)
Patient contacted regarding discharge from [facility] on 11/23/18.  Patient understands to follow up with provider Sharolyn Douglas on 12/01/18 at [place] at [facility] Patient understands discharge instructions? yes Patient understands medications and regiment? no  Patient said the nurse at discharge told him to take Aspirin 81 mg daily; however, he does not see it on his list. It does not appear patient needs to be on aspirin from reading Dr Darnelle Bos op note. I will route to Dr End to clarify.  Patient understands to bring all medications to this visit? Yes

## 2018-11-25 ENCOUNTER — Other Ambulatory Visit (HOSPITAL_COMMUNITY): Payer: Medicare Other

## 2018-11-25 ENCOUNTER — Other Ambulatory Visit: Payer: Medicare Other

## 2018-11-25 ENCOUNTER — Telehealth: Payer: Self-pay | Admitting: Internal Medicine

## 2018-11-25 DIAGNOSIS — R001 Bradycardia, unspecified: Secondary | ICD-10-CM | POA: Diagnosis not present

## 2018-11-25 DIAGNOSIS — I255 Ischemic cardiomyopathy: Secondary | ICD-10-CM | POA: Diagnosis not present

## 2018-11-25 DIAGNOSIS — I493 Ventricular premature depolarization: Secondary | ICD-10-CM | POA: Diagnosis not present

## 2018-11-25 NOTE — Telephone Encounter (Signed)
  Patient is calling because he needs to discuss his scheduled transmissions. He will be having a new device placed on 12/07/18.

## 2018-11-25 NOTE — Telephone Encounter (Signed)
LMOVM for pt to call DC back. Direct number given.

## 2018-11-25 NOTE — Telephone Encounter (Signed)
Pt returning [place] phone call. The pt just wanted both remote appointment on the same day because he get charge $20 each time he is billed for a remote check. I put both appointment on the same day for the pt.

## 2018-11-25 NOTE — Telephone Encounter (Signed)
Called patient and he verbalized understanding not to take aspirin but to remain on his other prescribed medications. He was appreciative.

## 2018-11-28 ENCOUNTER — Other Ambulatory Visit: Payer: Self-pay | Admitting: Internal Medicine

## 2018-11-28 DIAGNOSIS — I255 Ischemic cardiomyopathy: Secondary | ICD-10-CM

## 2018-11-28 DIAGNOSIS — I25119 Atherosclerotic heart disease of native coronary artery with unspecified angina pectoris: Secondary | ICD-10-CM

## 2018-11-28 DIAGNOSIS — I493 Ventricular premature depolarization: Secondary | ICD-10-CM

## 2018-11-28 DIAGNOSIS — R001 Bradycardia, unspecified: Secondary | ICD-10-CM

## 2018-12-01 ENCOUNTER — Ambulatory Visit: Payer: Medicare Other | Admitting: Nurse Practitioner

## 2018-12-02 ENCOUNTER — Ambulatory Visit (INDEPENDENT_AMBULATORY_CARE_PROVIDER_SITE_OTHER): Payer: Medicare Other

## 2018-12-02 ENCOUNTER — Other Ambulatory Visit: Payer: Medicare Other | Admitting: *Deleted

## 2018-12-02 ENCOUNTER — Other Ambulatory Visit: Payer: Self-pay

## 2018-12-02 ENCOUNTER — Ambulatory Visit (INDEPENDENT_AMBULATORY_CARE_PROVIDER_SITE_OTHER): Payer: Medicare Other | Admitting: *Deleted

## 2018-12-02 ENCOUNTER — Other Ambulatory Visit (HOSPITAL_COMMUNITY)
Admission: RE | Admit: 2018-12-02 | Discharge: 2018-12-02 | Disposition: A | Discharge status: Home / Self Care | Payer: Medicare Other | Source: Ambulatory Visit | Attending: Internal Medicine | Admitting: Internal Medicine

## 2018-12-02 ENCOUNTER — Telehealth: Payer: Self-pay | Admitting: Internal Medicine

## 2018-12-02 DIAGNOSIS — I5022 Chronic systolic (congestive) heart failure: Secondary | ICD-10-CM

## 2018-12-02 DIAGNOSIS — Z95810 Presence of automatic (implantable) cardiac defibrillator: Secondary | ICD-10-CM | POA: Diagnosis not present

## 2018-12-02 DIAGNOSIS — Z20828 Contact with and (suspected) exposure to other viral communicable diseases: Secondary | ICD-10-CM | POA: Diagnosis not present

## 2018-12-02 DIAGNOSIS — Z01812 Encounter for preprocedural laboratory examination: Secondary | ICD-10-CM | POA: Insufficient documentation

## 2018-12-02 DIAGNOSIS — I255 Ischemic cardiomyopathy: Secondary | ICD-10-CM | POA: Diagnosis not present

## 2018-12-02 LAB — CUP PACEART REMOTE DEVICE CHECK
Battery Voltage: 2.61 V
Brady Statistic RV Percent Paced: 1.66 %
Date Time Interrogation Session: 20200904063328
HighPow Impedance: 50 Ohm
HighPow Impedance: 63 Ohm
Implantable Lead Implant Date: 20041020
Implantable Lead Implant Date: 20100913
Implantable Lead Location: 753860
Implantable Lead Location: 753860
Implantable Lead Model: 5076
Implantable Lead Model: 6949
Implantable Pulse Generator Implant Date: 20100913
Lead Channel Impedance Value: 494 Ohm
Lead Channel Sensing Intrinsic Amplitude: 31.625 mV
Lead Channel Sensing Intrinsic Amplitude: 31.625 mV
Lead Channel Setting Pacing Amplitude: 3.5 V
Lead Channel Setting Pacing Pulse Width: 0.8 ms
Lead Channel Setting Sensing Sensitivity: 0.45 mV

## 2018-12-02 LAB — BASIC METABOLIC PANEL
BUN/Creatinine Ratio: 9 — ABNORMAL LOW (ref 10–24)
BUN: 12 mg/dL (ref 8–27)
CO2: 23 mmol/L (ref 20–29)
Calcium: 9.8 mg/dL (ref 8.6–10.2)
Chloride: 100 mmol/L (ref 96–106)
Creatinine, Ser: 1.28 mg/dL — ABNORMAL HIGH (ref 0.76–1.27)
GFR calc Af Amer: 62 mL/min/{1.73_m2} (ref >59)
GFR calc non Af Amer: 54 mL/min/{1.73_m2} — ABNORMAL LOW (ref >59)
Glucose: 106 mg/dL — ABNORMAL HIGH (ref 65–99)
Potassium: 4.3 mmol/L (ref 3.5–5.2)
Sodium: 138 mmol/L (ref 134–144)

## 2018-12-02 LAB — CBC
Hematocrit: 38.2 % (ref 37.5–51.0)
Hemoglobin: 12.3 g/dL — ABNORMAL LOW (ref 13.0–17.7)
MCH: 28.1 pg (ref 26.6–33.0)
MCHC: 32.2 g/dL (ref 31.5–35.7)
MCV: 87 fL (ref 79–97)
Platelets: 199 10*3/uL (ref 150–450)
RBC: 4.38 x10E6/uL (ref 4.14–5.80)
RDW: 13.4 % (ref 11.6–15.4)
WBC: 5.4 10*3/uL (ref 3.4–10.8)

## 2018-12-02 NOTE — Telephone Encounter (Signed)
LMOM for patient to call office. 

## 2018-12-02 NOTE — Telephone Encounter (Signed)
New message   Patient states that he needs a wash before his defibrillator is replaced. Please call.

## 2018-12-02 NOTE — Progress Notes (Signed)
EPIC Encounter for ICM Monitoring  Patient Name: Mark Clayton is a 78 y.o. male Date: 12/02/2018 Primary Care Physican: [name], MD Cardiologist:End Electrophysiologist: Caryl Comes BaseWeight:161lbs 10/24/2018 Weight: 161 - 162 lbs       Heart failure questions reviewed and patientdoes not have any fluid symptoms. 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  He had a stent placed on 11/22/2018.   Battery scheduled to be replaced 12/07/2018.  OptivolThoracic impedancenormal.  Prescribed:Furosemide 40 mg 1 tablet daily. Dr Caryl Comes advised patient can take extra Furosemide when needed (see 04/14/2018 phone note).   Labs: 12/02/2018 Creatinine 1.28, BUN 12, Potassium 4.3, Sodium 138, GFR 54-62 11/23/2018 Creatinine 1.22, BUN 13, Potassium 3.9, Sodium 139, GFR 57->60  11/16/2018 Creatinine 1.19, BUN 10, Potassium 4.5, Sodium 141, GFR 59-68  08/26/2018 Creatinine 1.27, BUN 16, Potassium 4.2, Sodium 140, GFR 54.89 Acomplete set of results can be found in Results Review.  Recommendations: No changes and encouraged to call for any fluid symptoms.  Follow-up plan: ICM clinic phone appointment10/26/2020.  Copy of ICM check sent to [place].   3 month ICM trend: 12/02/2018    1 Year ICM trend:       Rosalene Billings, RN 12/02/2018 4:52 PM

## 2018-12-03 ENCOUNTER — Other Ambulatory Visit (HOSPITAL_COMMUNITY): Payer: Medicare Other

## 2018-12-03 LAB — NOVEL CORONAVIRUS, NAA (HOSP ORDER, SEND-OUT TO REF LAB; TAT 18-24 HRS): SARS-CoV-2, NAA: NOT DETECTED

## 2018-12-07 ENCOUNTER — Ambulatory Visit (HOSPITAL_COMMUNITY)
Admission: RE | Disposition: A | Discharge status: Home / Self Care | Payer: Medicare Other | Source: Home / Self Care | Attending: Internal Medicine

## 2018-12-07 ENCOUNTER — Other Ambulatory Visit: Payer: Self-pay

## 2018-12-07 ENCOUNTER — Ambulatory Visit (HOSPITAL_COMMUNITY)
Admission: RE | Admit: 2018-12-07 | Discharge: 2018-12-07 | Disposition: A | Discharge status: Home / Self Care | Payer: Medicare Other | Attending: Internal Medicine | Admitting: Internal Medicine

## 2018-12-07 DIAGNOSIS — K219 Gastro-esophageal reflux disease without esophagitis: Secondary | ICD-10-CM | POA: Insufficient documentation

## 2018-12-07 DIAGNOSIS — Z85038 Personal history of other malignant neoplasm of large intestine: Secondary | ICD-10-CM | POA: Diagnosis not present

## 2018-12-07 DIAGNOSIS — I130 Hypertensive heart and chronic kidney disease with heart failure and stage 1 through stage 4 chronic kidney disease, or unspecified chronic kidney disease: Secondary | ICD-10-CM | POA: Insufficient documentation

## 2018-12-07 DIAGNOSIS — I480 Paroxysmal atrial fibrillation: Secondary | ICD-10-CM | POA: Insufficient documentation

## 2018-12-07 DIAGNOSIS — Z955 Presence of coronary angioplasty implant and graft: Secondary | ICD-10-CM | POA: Insufficient documentation

## 2018-12-07 DIAGNOSIS — E785 Hyperlipidemia, unspecified: Secondary | ICD-10-CM | POA: Diagnosis not present

## 2018-12-07 DIAGNOSIS — M1990 Unspecified osteoarthritis, unspecified site: Secondary | ICD-10-CM | POA: Insufficient documentation

## 2018-12-07 DIAGNOSIS — I5022 Chronic systolic (congestive) heart failure: Secondary | ICD-10-CM | POA: Diagnosis not present

## 2018-12-07 DIAGNOSIS — Z79899 Other long term (current) drug therapy: Secondary | ICD-10-CM | POA: Diagnosis not present

## 2018-12-07 DIAGNOSIS — Z45010 Encounter for checking and testing of cardiac pacemaker pulse generator [battery]: Secondary | ICD-10-CM | POA: Diagnosis not present

## 2018-12-07 DIAGNOSIS — Z951 Presence of aortocoronary bypass graft: Secondary | ICD-10-CM | POA: Insufficient documentation

## 2018-12-07 DIAGNOSIS — I252 Old myocardial infarction: Secondary | ICD-10-CM | POA: Insufficient documentation

## 2018-12-07 DIAGNOSIS — I255 Ischemic cardiomyopathy: Secondary | ICD-10-CM | POA: Diagnosis not present

## 2018-12-07 DIAGNOSIS — N183 Chronic kidney disease, stage 3 (moderate): Secondary | ICD-10-CM | POA: Diagnosis not present

## 2018-12-07 DIAGNOSIS — Z7901 Long term (current) use of anticoagulants: Secondary | ICD-10-CM | POA: Insufficient documentation

## 2018-12-07 DIAGNOSIS — I25119 Atherosclerotic heart disease of native coronary artery with unspecified angina pectoris: Secondary | ICD-10-CM | POA: Diagnosis not present

## 2018-12-07 DIAGNOSIS — R001 Bradycardia, unspecified: Secondary | ICD-10-CM | POA: Insufficient documentation

## 2018-12-07 DIAGNOSIS — Z4502 Encounter for adjustment and management of automatic implantable cardiac defibrillator: Secondary | ICD-10-CM | POA: Diagnosis not present

## 2018-12-07 HISTORY — PX: ICD GENERATOR CHANGEOUT: EP1231

## 2018-12-07 LAB — SURGICAL PCR SCREEN
MRSA, PCR: NEGATIVE
Staphylococcus aureus: POSITIVE — AB

## 2018-12-07 SURGERY — ICD GENERATOR CHANGEOUT

## 2018-12-07 MED ORDER — SODIUM CHLORIDE 0.9 % IV SOLN: INTRAVENOUS | Status: DC

## 2018-12-07 MED ORDER — CHLORHEXIDINE GLUCONATE 4 % EX LIQD
60.0000 mL | Freq: Once | CUTANEOUS | Status: DC
  Filled 2018-12-07: qty 60

## 2018-12-07 MED ORDER — SODIUM CHLORIDE 0.9 % IV SOLN
INTRAVENOUS | Status: AC
  Filled 2018-12-07: qty 2

## 2018-12-07 MED ORDER — MIDAZOLAM HCL 5 MG/5ML IJ SOLN
INTRAMUSCULAR | Status: DC | PRN
  Administered 2018-12-07: 2 mg via INTRAVENOUS

## 2018-12-07 MED ORDER — HEPARIN (PORCINE) IN NACL 1000-0.9 UT/500ML-% IV SOLN
INTRAVENOUS | Status: AC
  Filled 2018-12-07: qty 500

## 2018-12-07 MED ORDER — SODIUM CHLORIDE 0.9 % IV SOLN
80.0000 mg | INTRAVENOUS | Status: AC
  Administered 2018-12-07: 14:00:00 80 mg

## 2018-12-07 MED ORDER — FENTANYL CITRATE (PF) 100 MCG/2ML IJ SOLN
INTRAMUSCULAR | Status: DC | PRN
  Administered 2018-12-07: 25 ug via INTRAVENOUS

## 2018-12-07 MED ORDER — ACETAMINOPHEN 325 MG PO TABS
325.0000 mg | ORAL_TABLET | ORAL | Status: DC | PRN
  Filled 2018-12-07: qty 2

## 2018-12-07 MED ORDER — MUPIROCIN 2 % EX OINT
TOPICAL_OINTMENT | CUTANEOUS | Status: AC
  Administered 2018-12-07: 1
  Filled 2018-12-07: qty 22

## 2018-12-07 MED ORDER — ONDANSETRON HCL 4 MG/2ML IJ SOLN: 4.0000 mg | Freq: Four times a day (QID) | INTRAMUSCULAR | Status: DC | PRN

## 2018-12-07 MED ORDER — FENTANYL CITRATE (PF) 100 MCG/2ML IJ SOLN
INTRAMUSCULAR | Status: AC
  Filled 2018-12-07: qty 2

## 2018-12-07 MED ORDER — CEFAZOLIN SODIUM-DEXTROSE 2-4 GM/100ML-% IV SOLN
INTRAVENOUS | Status: AC
  Filled 2018-12-07: qty 100

## 2018-12-07 MED ORDER — IOHEXOL 350 MG/ML SOLN
INTRAVENOUS | Status: DC | PRN
  Administered 2018-12-07: 15:00:00 15 mL

## 2018-12-07 MED ORDER — MUPIROCIN 2 % EX OINT
1.0000 "application " | TOPICAL_OINTMENT | Freq: Once | CUTANEOUS | Status: DC
  Filled 2018-12-07: qty 22

## 2018-12-07 MED ORDER — CEFAZOLIN SODIUM-DEXTROSE 2-4 GM/100ML-% IV SOLN
2.0000 g | INTRAVENOUS | Status: AC
  Administered 2018-12-07: 2 g via INTRAVENOUS

## 2018-12-07 MED ORDER — MIDAZOLAM HCL 5 MG/5ML IJ SOLN
INTRAMUSCULAR | Status: AC
  Filled 2018-12-07: qty 5

## 2018-12-07 MED ORDER — LIDOCAINE HCL (PF) 1 % IJ SOLN
INTRAMUSCULAR | Status: DC | PRN
  Administered 2018-12-07: 55 mL

## 2018-12-07 MED ORDER — LIDOCAINE HCL (PF) 1 % IJ SOLN
INTRAMUSCULAR | Status: AC
  Filled 2018-12-07: qty 60

## 2018-12-07 SURGICAL SUPPLY — 6 items
CABLE SURGICAL S-101-97-12 (CABLE) ×3 IMPLANT
HEMOSTAT SURGICEL 2X4 FIBR (HEMOSTASIS) ×3 IMPLANT
ICD VISIA MRI DVFB1D1 (ICD Generator) ×3 IMPLANT
PAD PRO RADIOLUCENT 2001M-C (PAD) ×3 IMPLANT
POUCH AIGIS-R ANTIBACT ICD (Mesh General) ×3 IMPLANT
TRAY PACEMAKER INSERTION (PACKS) ×3 IMPLANT

## 2018-12-07 NOTE — Interval H&P Note (Signed)
ICD Criteria  Current LVEF:25%. Within 12 months prior to implant: No   Heart failure history: Yes, Class III  Cardiomyopathy history: Yes, Ischemic Cardiomyopathy - Prior MI.  Atrial Fibrillation/Atrial Flutter: Yes, Paroxysmal.  Ventricular tachycardia history: No.  Cardiac arrest history: No.  History of syndromes with risk of sudden death: No.  Previous ICD: Yes, Reason for ICD:  Primary prevention.  Current ICD indication: Primary  PPM indication: No.  Class I or II Bradycardia indication present: No  Beta Blocker therapy for 3 or more months: No, medical reason.  Ace Inhibitor/ARB therapy for 3 or more months: Yes, prescribed.    I have seen Mark Clayton is a 78 y.o. malepre-procedural and   for consideration of ICD CHANGE OUT for PRIMARY prevention of sudden death.  The patient's chart has been reviewed and they meet criteria for ICD implant.  I have had a thorough discussion with the patient reviewing options.  The patient and their family (if available) have had opportunities to ask questions and have them answered. The patient and I have decided together through the [place] Support Tool to  REIMPLANT ICD at this time.  Risks, benefits, alternatives to ICD implantation were discussed in detail with the patient today. The patient  understands that the risks include but are not limited to bleeding, infection, pneumothorax, perforation, tamponade, vascular damage, renal failure, MI, stroke, death, inappropriate shocks, and lead dislodgement and   wishes to proceed.  History and Physical Interval Note:  12/07/2018 3:04 PM  Mark Clayton  has presented today for surgery, with the diagnosis of ERI.  The various methods of treatment have been discussed with the patient and family. After consideration of risks, benefits and other options for treatment, the patient has consented to  Procedure(s): ICD [place] (N/A) as a surgical intervention.  The  patient's history has been reviewed, patient examined, no change in status, stable for surgery.  I have reviewed the patient's chart and labs.  Questions were answered to the patient's satisfaction.     Virl Axe

## 2018-12-07 NOTE — Interval H&P Note (Signed)
History and Physical Interval Note:  12/07/2018 12:15 PM  Mark Clayton  has presented today for surgery, with the diagnosis of ERI.  The various methods of treatment have been discussed with the patient and family. After consideration of risks, benefits and other options for treatment, the patient has consented to  Procedure(s): ICD [place] (N/A) as a surgical intervention.  The patient's history has been reviewed, patient examined, no change in status, stable for surgery.  I have reviewed the patient's chart and labs.  Questions were answered to the patient's satisfaction.     Virl Axe  iNTERVAL INTERVENTION with marked improvement in exercise tolerance  HR excursion by ZIO --daytime means >>65  And range up to 85 or so Denies exercise limitation at present Hence will proceed with gen replacement and no upgrade currently  This will be done with Aegis pouch

## 2018-12-07 NOTE — CV Procedure (Signed)
Mark Clayton [id number]  [id number]  Preop Dx: PRIOR implantation of L [place] vein with introduction of another lead Postop Dx same/  Procedure:venograme  Cx: None  LUE venography demonstrated patency of the [facility] vein to the innominate  But moves very briskly so presumably open all the way to SVC      Virl Axe, MD 12/07/2018 3:07 PM

## 2018-12-07 NOTE — Discharge Instructions (Signed)
Dermabond will come off in next 10-14 days; if not will remove at wound check  Keep wound dry until tomorrow evening  No Driving x 4 days  Wound check in office as scheduled     Pacemaker Battery Change, Care After This sheet gives you information about how to care for yourself after your procedure. Your health care provider may also give you more specific instructions. If you have problems or questions, contact your health care provider. What can I expect after the procedure? After your procedure, it is common to have:  Pain or soreness at the site where the pacemaker was inserted.  Swelling at the site where the pacemaker was inserted. Follow these instructions at home: Incision care   Keep the incision clean and dry. ? Do not take baths, swim, or use a hot tub for 1 week. ? You may shower the day after your procedure, or as directed by your health care provider. ? Pat the area dry with a clean towel. Do not rub the area. This may cause bleeding.  Follow instructions from your health care provider about how to take care of your incision. Make sure you: ? Wash your hands with soap and water before you change your bandage (dressing). If soap and water are not available, use hand sanitizer. ? Change your dressing as told by your health care provider. ? Leave stitches (sutures), skin glue, or adhesive strips in place. These skin closures may need to stay in place for 2 weeks or longer. If adhesive strip edges start to loosen and curl up, you may trim the loose edges. Do not remove adhesive strips completely unless your health care provider tells you to do that.  Check your incision area every day for signs of infection. Check for: ? More redness, swelling, or pain. ? More fluid or blood. ? Warmth. ? Pus or a bad smell. Activity  Do not lift anything that is heavier than 10 lb (4.5 kg) until your health care provider says it is okay to do so.  For the first 2 weeks, or as long as  told by your health care provider: ? Avoid lifting your left arm higher than your shoulder. ? Be gentle when you move your arms over your head. It is okay to raise your arm to comb your hair. ? Avoid strenuous exercise.  Ask your health care provider when it is okay to: ? Resume your normal activities. ? Return to work or school. ? Resume sexual activity. Eating and drinking  Eat a heart-healthy diet. This should include plenty of fresh fruits and vegetables, whole grains, low-fat dairy products, and lean protein like chicken and fish.  Limit alcohol intake to no more than 1 drink a day for non-pregnant women and 2 drinks a day for men. One drink equals 12 oz of beer, 5 oz of wine, or 1 oz of hard liquor.  Check ingredients and nutrition facts on packaged foods and beverages. Avoid the following types of food: ? Food that is high in salt (sodium). ? Food that is high in saturated fat, like full-fat dairy or red meat. ? Food that is high in trans fat, like fried food. ? Food and drinks that are high in sugar. Lifestyle  Do not use any products that contain nicotine or tobacco, such as cigarettes and e-cigarettes. If you need help quitting, ask your health care provider.  Take steps to manage and control your weight.  Get regular exercise. Aim for 150 minutes  of moderate-intensity exercise (such as walking or yoga) or 75 minutes of vigorous exercise (such as running or swimming) each week.  Manage other health problems, such as diabetes or high blood pressure. Ask your health care provider how you can manage these conditions. General instructions  Do not drive for 24 hours after your procedure if you were given a medicine to help you relax (sedative).  Take over-the-counter and prescription medicines only as told by your health care provider.  Avoid putting pressure on the area where the pacemaker was placed.  If you need an MRI after your pacemaker has been placed, be sure to  tell the health care provider who orders the MRI that you have a pacemaker.  Avoid close and prolonged exposure to electrical devices that have strong magnetic fields. These include: ? Cell phones. Avoid keeping them in a pocket near the pacemaker, and try using the ear opposite the pacemaker. ? MP3 players. ? Household appliances, like microwaves. ? Metal detectors. ? Electric generators. ? High-tension wires.  Keep all follow-up visits as directed by your health care provider. This is important. Contact a health care provider if:  You have pain at the incision site that is not relieved by over-the-counter or prescription medicines.  You have any of these around your incision site or coming from it: ? More redness, swelling, or pain. ? Fluid or blood. ? Warmth to the touch. ? Pus or a bad smell.  You have a fever.  You feel brief, occasional palpitations, light-headedness, or any symptoms that you think might be related to your heart. Get help right away if:  You experience chest pain that is different from the pain at the pacemaker site.  You develop a red streak that extends above or below the incision site.  You experience shortness of breath.  You have palpitations or an irregular heartbeat.  You have light-headedness that does not go away quickly.  You faint or have dizzy spells.  Your pulse suddenly drops or increases rapidly and does not return to normal.  You begin to gain weight and your legs and ankles swell. Summary  After your procedure, it is common to have pain, soreness, and some swelling where the pacemaker was inserted.  Make sure to keep your incision clean and dry. Follow instructions from your health care provider about how to take care of your incision.  Check your incision every day for signs of infection, such as more pain or swelling, pus or a bad smell, warmth, or leaking fluid and blood.  Avoid strenuous exercise and lifting your left arm  higher than your shoulder for 2 weeks, or as long as told by your health care provider. This information is not intended to replace advice given to you by your health care provider. Make sure you discuss any questions you have with your health care provider. Document Released: 01/04/2013 Document Revised: 02/26/2017 Document Reviewed: 02/06/2016 Elsevier Patient Education  2020 [employer].

## 2018-12-07 NOTE — Progress Notes (Signed)
Patient instructed to take mupirocin at home due to a positive staph swab. Mupirocin instruction sheet and mupirocin sent home with patient.

## 2018-12-08 ENCOUNTER — Encounter (HOSPITAL_COMMUNITY): Payer: Self-pay | Admitting: Internal Medicine

## 2018-12-08 ENCOUNTER — Ambulatory Visit: Payer: Medicare Other

## 2018-12-08 MED FILL — Heparin Sod (Porcine)-NaCl IV Soln 1000 Unit/500ML-0.9%: INTRAVENOUS | Qty: 500 | Status: AC

## 2018-12-12 DIAGNOSIS — H353211 Exudative age-related macular degeneration, right eye, with active choroidal neovascularization: Secondary | ICD-10-CM | POA: Diagnosis not present

## 2018-12-12 DIAGNOSIS — H353212 Exudative age-related macular degeneration, right eye, with inactive choroidal neovascularization: Secondary | ICD-10-CM | POA: Diagnosis not present

## 2018-12-12 DIAGNOSIS — H353112 Nonexudative age-related macular degeneration, right eye, intermediate dry stage: Secondary | ICD-10-CM | POA: Diagnosis not present

## 2018-12-13 ENCOUNTER — Ambulatory Visit: Payer: Medicare Other

## 2018-12-14 ENCOUNTER — Encounter: Payer: Self-pay | Admitting: Cardiology

## 2018-12-14 NOTE — Progress Notes (Signed)
Remote ICD transmission.   

## 2018-12-15 ENCOUNTER — Ambulatory Visit: Payer: Medicare Other | Admitting: Internal Medicine

## 2018-12-15 NOTE — Progress Notes (Signed)
Wound check appointment. See [facility]  Chanetta Marshall, NP 12/15/2018 7:24 PM

## 2018-12-16 ENCOUNTER — Ambulatory Visit (INDEPENDENT_AMBULATORY_CARE_PROVIDER_SITE_OTHER): Payer: Medicare Other | Admitting: Nurse Practitioner

## 2018-12-16 ENCOUNTER — Other Ambulatory Visit: Payer: Self-pay

## 2018-12-16 DIAGNOSIS — I5022 Chronic systolic (congestive) heart failure: Secondary | ICD-10-CM | POA: Diagnosis not present

## 2018-12-16 NOTE — Patient Instructions (Addendum)
Medication Instructions:  none If you need a refill on your cardiac medications before your next appointment, please call your pharmacy.   Lab work: none If you have labs (blood work) drawn today and your tests are completely normal, you will receive your results only by: Marland Kitchen MyChart Message (if you have MyChart) OR . A paper copy in the mail If you have any lab test that is abnormal or we need to change your treatment, we will call you to review the results.  Testing/Procedures: none  Follow-Up: Scheduled with Dr Caryl Comes  6 Months with Dr Irish Lack At [facility], you and your health needs are our priority.  As part of our continuing mission to provide you with exceptional heart care, we have created designated Provider Care Teams.  These Care Teams include your primary Cardiologist (physician) and Advanced Practice Providers (APPs -  Physician Assistants and Nurse Practitioners) who all work together to provide you with the care you need, when you need it. .   Any Other Special Instructions Will Be Listed Below (If Applicable).

## 2018-12-19 ENCOUNTER — Ambulatory Visit: Payer: Medicare Other

## 2018-12-26 ENCOUNTER — Telehealth: Payer: Self-pay | Admitting: Internal Medicine

## 2018-12-26 NOTE — Telephone Encounter (Signed)
Pt called with numbness tingling to his Left index finger and thumb, intermittent, worse over the past 2-3 nights, first noticed within this last week.   He wanted to make sure it wasn't a side effect of his gen change, or heart catheterization that he had 11/22/2018.    Pt complains of soreness with movement in that hand at times, as well as possible swelling in the joint between the base of his finger and thumb.    Encouraged that the likelihood his heart is involved is very low, and with late presentation, unlikely to be related to either his gen change, nor his heart catheterization.  Pt verbalizes understanding, and plans follow up with his PCP.   Will forward to Dr. Caryl Comes as Juluis Rainier.   Legrand Como [address]" [place], PA-C  12/26/2018 10:48 AM

## 2018-12-26 NOTE — Telephone Encounter (Signed)
° ° °  Patient calling to report numbness in left hand. No other symptoms

## 2018-12-30 ENCOUNTER — Other Ambulatory Visit: Payer: Self-pay | Admitting: Internal Medicine

## 2018-12-30 ENCOUNTER — Other Ambulatory Visit (HOSPITAL_COMMUNITY): Payer: Self-pay | Admitting: Nurse Practitioner

## 2018-12-30 MED ORDER — NITROGLYCERIN 0.4 MG SL SUBL: 0.4000 mg | SUBLINGUAL_TABLET | SUBLINGUAL | 1 refills | Status: DC | PRN

## 2018-12-30 NOTE — Telephone Encounter (Signed)
Would not expect this to be related to cardiac procedures

## 2019-01-16 DIAGNOSIS — H35722 Serous detachment of retinal pigment epithelium, left eye: Secondary | ICD-10-CM | POA: Diagnosis not present

## 2019-01-16 DIAGNOSIS — H353211 Exudative age-related macular degeneration, right eye, with active choroidal neovascularization: Secondary | ICD-10-CM | POA: Diagnosis not present

## 2019-01-16 DIAGNOSIS — H353122 Nonexudative age-related macular degeneration, left eye, intermediate dry stage: Secondary | ICD-10-CM | POA: Diagnosis not present

## 2019-01-17 ENCOUNTER — Telehealth: Payer: Self-pay | Admitting: Internal Medicine

## 2019-01-17 NOTE — Telephone Encounter (Signed)
Returned call as requested by voice mail message.  He said he missed a call from Dr Darnelle Bos office but there was no message left.   Advised there is no telephone note showing that someone called him.  He said he is doing fine.  Advised next ICM remote transmission is 01/23/2019.

## 2019-01-17 NOTE — Telephone Encounter (Signed)
Patient returning call.

## 2019-01-23 ENCOUNTER — Ambulatory Visit (INDEPENDENT_AMBULATORY_CARE_PROVIDER_SITE_OTHER): Payer: Medicare Other

## 2019-01-23 DIAGNOSIS — I5022 Chronic systolic (congestive) heart failure: Secondary | ICD-10-CM | POA: Diagnosis not present

## 2019-01-23 DIAGNOSIS — H353122 Nonexudative age-related macular degeneration, left eye, intermediate dry stage: Secondary | ICD-10-CM | POA: Diagnosis not present

## 2019-01-23 DIAGNOSIS — Z95810 Presence of automatic (implantable) cardiac defibrillator: Secondary | ICD-10-CM

## 2019-01-23 DIAGNOSIS — H353211 Exudative age-related macular degeneration, right eye, with active choroidal neovascularization: Secondary | ICD-10-CM | POA: Diagnosis not present

## 2019-01-23 DIAGNOSIS — H353221 Exudative age-related macular degeneration, left eye, with active choroidal neovascularization: Secondary | ICD-10-CM | POA: Diagnosis not present

## 2019-01-23 DIAGNOSIS — H35722 Serous detachment of retinal pigment epithelium, left eye: Secondary | ICD-10-CM | POA: Diagnosis not present

## 2019-01-24 NOTE — Progress Notes (Signed)
EPIC Encounter for ICM Monitoring  Patient Name: Mark Clayton is a 78 y.o. male Date: 01/24/2019 Primary Care Physican: [name], MD Cardiologist:End Electrophysiologist: Caryl Comes BaseWeight:161lbs 10/24/2018 Weight: 161 - 162 lbs   Attempted call to patient and unable to reach.  Left detailed message per DPR regarding transmission. Transmission reviewed.   OptivolThoracic impedancenormal.  Prescribed:Furosemide 40 mg 1 tablet daily. Dr Caryl Comes advised patient can take extra Furosemide when needed (see 04/14/2018 phone note).   Labs: 12/02/2018 Creatinine 1.28, BUN 12, Potassium 4.3, Sodium 138, GFR 54-62 11/23/2018 Creatinine 1.22, BUN 13, Potassium 3.9, Sodium 139, GFR 57->60  11/16/2018 Creatinine 1.19, BUN 10, Potassium 4.5, Sodium 141, GFR 59-68  08/26/2018 Creatinine 1.27, BUN 16, Potassium 4.2, Sodium 140, GFR 54.89 Acomplete set of results can be found in Results Review.  Recommendations: Left voice mail with ICM number and encouraged to call if experiencing any fluid symptoms.  Follow-up plan: ICM clinic phone appointment on 02/27/2019.   Office appt 03/28/2019 with Dr. Caryl Comes.    Copy of ICM check sent to Dr. Caryl Comes.   3 month ICM trend: 01/23/2019    1 Year ICM trend:       Rosalene Billings, RN 01/24/2019 12:15 PM

## 2019-01-31 ENCOUNTER — Other Ambulatory Visit: Payer: Self-pay | Admitting: Cardiology

## 2019-02-02 ENCOUNTER — Other Ambulatory Visit: Payer: Self-pay | Admitting: *Deleted

## 2019-02-14 NOTE — Progress Notes (Signed)
Subjective:   Mark Clayton is a 78 y.o. male who presents for Medicare Annual/Subsequent preventive examination. I connected with patient by a telephone and verified that I am speaking with the correct person using two identifiers. Patient stated full name and DOB. Patient gave permission to continue with telephonic visit. Patient's location was at home and Nurse's location was at [place] office. Participants during this visit included patient and nurse.  Review of Systems:   Cardiac Risk Factors include: advanced age (>39men, >85 women);dyslipidemia;hypertension;male gender Sleep patterns: feels rested on waking, gets up 1-2 times nightly to void and sleeps 7-8 hours nightly.    Home Safety/Smoke Alarms: Feels safe in home. Smoke alarms in place.  Living environment; residence and Firearm Safety: 1-story house/ trailer. Lives with daughter, no needs for DME, good support system Seat Belt Safety/Bike Helmet: Wears seat belt.      Objective:    Vitals: There were no vitals taken for this visit.  There is no height or weight on file to calculate BMI.  Advanced Directives 02/15/2019 12/07/2018 11/22/2018 09/27/2017 08/10/2017 07/27/2017 05/03/2017  Does Patient Have a Medical Advance Directive? Yes Yes Yes Yes Yes Yes Yes  Type of Paramedic of [place];Living will Healthcare Power of Attorney Living will Living will;Healthcare Power of [place];Living will  Does patient want to make changes to medical advance directive? - No - Patient declined No - Patient declined No - Patient declined - No - Patient declined -  Copy of [place] in Chart? No - copy requested No - copy requested - No - copy requested No - copy requested No - copy requested Yes  Would patient like information on creating a medical advance directive? - - - - - - -  Pre-existing out of facility DNR order  (yellow form or pink MOST form) - - - - - - -    Tobacco Social History   Tobacco Use  Smoking Status Never Smoker  Smokeless Tobacco Never Used     Counseling given: Not Answered  Past Medical History:  Diagnosis Date  . AICD (automatic cardioverter/defibrillator) present 01/17/2003   Medtronic Maximo 7232CX ICD, serial [id number]  . Anemia 02-06-11   takes oral iron  . Arthritis    hands, knees  . CAD (coronary artery disease) 2003   a. h/o MI and CABG in 2003. b. s/p DES to SVG-RPDA-RPLB in 08/2014.  Marland Kitchen Cancer of sigmoid colon ([place]) 2012   a. s/p colon surgery.  . Carotid bruit   . Chronic systolic CHF (congestive heart failure) (HCC)    a. EF 20% in 2014; b. 08/2017 Echo: EF 20-25%, diff HK, Gr3 DD. Triv AI. Mod MR. Sev dil LA. Mildly dil RV w/ mildly reduced RV fxn. Mildly dil RA. Mod TR. PASP 5mHg.  Marland Kitchen Cough   . GERD (gastroesophageal reflux disease) 02-06-11  . HTN (hypertension)   . Hyperlipidemia   . Ischemic cardiomyopathy    a. EF 20% in 2014. (Master study EF >20%); b. 08/2017 Echo: EF 20-25%, diff HK. Gr3 DD.  . LV (left ventricular) mural thrombus    a. 12/2012 Echo: EF 20% with mural thrombus No evidence of thrombus on 08/2017 echo.  . Macular degeneration   . Myocardial infarct ([place])    2003  . PAF (paroxysmal atrial fibrillation) (HCC)    a. CHA2DS2VASc = 5-->Xarelto/Tikosyn.   Past Surgical History:  Procedure Laterality  Date  . CARDIAC CATHETERIZATION N/A 09/20/2014   Procedure: Left Heart Cath and Coronary Angiography;  Surgeon: Jettie Booze, MD; LAD 95%, D1 100%, CFX liner percent, OM 200%, OM 390%, RCA 90%, LIMA-LAD okay, SVG-OM 2-OM 3 minimal disease, SVG-RPDA-RPLB 100% between the RPDA and RPL     . CARDIAC CATHETERIZATION N/A 09/20/2014   Procedure: Coronary Stent Intervention;  Surgeon: Jettie Booze, MD; Synergy DES 4 x 24 mm reducing the stenosis to 5%   . CARDIAC DEFIBRILLATOR PLACEMENT  01/17/03   6949 lead. Medtronic. remote-no; with  later revision  . CARDIOVERSION N/A 05/22/2016   Procedure: CARDIOVERSION;  Surgeon: Lelon Perla, MD;  Location: [facility] ENDOSCOPY;  Service: Cardiovascular;  Laterality: N/A;  . CARDIOVERSION N/A 08/10/2017   Procedure: CARDIOVERSION;  Surgeon: Pixie Casino, MD;  Location: [facility] ENDOSCOPY;  Service: Cardiovascular;  Laterality: N/A;  . CATARACT EXTRACTION W/ INTRAOCULAR LENS  IMPLANT, BILATERAL Bilateral June/-July 2009   Dr. Katy Fitch  . COLON RESECTION  02/09/2011   Procedure: LAPAROSCOPIC SIGMOID COLON RESECTION;  Surgeon: Pedro Earls, MD;  Location: [facility] ORS;  Service: General;  Laterality: N/A;  Laparoscopic Assisted Sigmoid Colectomy  . COLON SURGERY    . COLONOSCOPY  08/31/2011   Procedure: COLONOSCOPY;  Surgeon: Jerene Bears, MD;  Location: [facility] ENDOSCOPY;  Service: Gastroenterology;  Laterality: N/A;  . COLONOSCOPY N/A 09/05/2012   Procedure: COLONOSCOPY;  Surgeon: Jerene Bears, MD;  Location: [facility] ENDOSCOPY;  Service: Gastroenterology;  Laterality: N/A;  . COLONOSCOPY N/A 04/18/2013   Procedure: COLONOSCOPY;  Surgeon: Jerene Bears, MD;  Location: [facility] ENDOSCOPY;  Service: Gastroenterology;  Laterality: N/A;  . COLONOSCOPY N/A 04/09/2014   Procedure: COLONOSCOPY;  Surgeon: Jerene Bears, MD;  Location: [facility] ENDOSCOPY;  Service: Gastroenterology;  Laterality: N/A;  . COLONOSCOPY WITH PROPOFOL N/A 05/03/2017   Procedure: COLONOSCOPY WITH PROPOFOL;  Surgeon: Jerene Bears, MD;  Location: [facility] ENDOSCOPY;  Service: Gastroenterology;  Laterality: N/A;  . CORONARY ANGIOPLASTY    . CORONARY ARTERY BYPASS GRAFT  01/2002   LIMA-LAD, SVG-OM 2-OM 3, SVG-RPDA-RPLB  . CORONARY STENT INTERVENTION N/A 11/22/2018   Procedure: CORONARY STENT INTERVENTION;  Surgeon: Nelva Bush, MD;  Location: [place] CV LAB;  Service: Cardiovascular;  Laterality: N/A;  SVG - RCA  . ICD GENERATOR CHANGE  2010   Medtronic Virtuoso II VR ICD  . ICD GENERATOR CHANGEOUT N/A 12/07/2018   Procedure: ICD GENERATOR CHANGEOUT;   Surgeon: Deboraha Sprang, MD;  Location: [place] CV LAB;  Service: Cardiovascular;  Laterality: N/A;  . INGUINAL HERNIA REPAIR Right 2000's X 2  . LAPAROSCOPIC RIGHT HEMI COLECTOMY N/A 11/04/2012   Procedure: LAPAROSCOPIC RIGHT HEMI COLECTOMY;  Surgeon: Pedro Earls, MD;  Location: [facility] ORS;  Service: General;  Laterality: N/A;  . LEFT HEART CATH AND CORS/GRAFTS ANGIOGRAPHY Left 11/22/2018   Procedure: LEFT HEART CATH AND CORS/GRAFTS ANGIOGRAPHY;  Surgeon: Nelva Bush, MD;  Location: [place] CV LAB;  Service: Cardiovascular;  Laterality: Left;  . TONSILLECTOMY  ~ 1950   Family History  Problem Relation Age of Onset  . Hypertension Father   . Hyperlipidemia Father   . Heart disease Father   . Prostate cancer Father   . Alzheimer's disease Mother   . Hypertension Sister   . Hyperlipidemia Sister   . Colon cancer Neg Hx   . Esophageal cancer Neg Hx   . Rectal cancer Neg Hx   . Stomach cancer Neg Hx    Social History  Socioeconomic History  . Marital status: Widowed    Spouse name: Not on file  . Number of children: 3  . Years of education: Not on file  . Highest education level: Not on file  Occupational History  . Occupation: self employed/ [employer]  . Financial resource strain: Not hard at all  . Food insecurity    Worry: Never true    Inability: Never true  . Transportation needs    Medical: No    Non-medical: No  Tobacco Use  . Smoking status: Never Smoker  . Smokeless tobacco: Never Used  Substance and Sexual Activity  . Alcohol use: Yes    Alcohol/week: 2.0 standard drinks    Types: 2 Cans of beer per week  . Drug use: No  . Sexual activity: Not Currently  Lifestyle  . Physical activity    Days per week: 6 days    Minutes per session: 40 min  . Stress: Not on file  Relationships  . Social connections    Talks on phone: More than three times a week    Gets together: More than three times a week    Attends religious service: More  than 4 times per year    Active member of club or organization: Yes    Attends meetings of clubs or organizations: More than 4 times per year    Relationship status: Widowed  Other Topics Concern  . Not on file  Social History Narrative   Full time. Married.     Outpatient Encounter Medications as of 02/15/2019  Medication Sig  . atorvastatin (LIPITOR) 80 MG tablet Take 1 tablet (80 mg total) by mouth daily.  . carvedilol (COREG) 6.25 MG tablet Take 1 tablet (6.25 mg total) by mouth 2 (two) times daily with a meal.  . clopidogrel (PLAVIX) 75 MG tablet Take 1 tablet (75 mg total) by mouth daily with breakfast.  . dofetilide (TIKOSYN) 250 MCG capsule TAKE 1 CAPSULE BY MOUTH TWO TIMES DAILY  . furosemide (LASIX) 40 MG tablet TAKE 1 TABLET BY MOUTH  DAILY  . isosorbide mononitrate (IMDUR) 30 MG 24 hr tablet TAKE 1 TABLET BY MOUTH  DAILY (Patient taking differently: Take 30 mg by mouth daily. )  . losartan (COZAAR) 50 MG tablet TAKE 1 TABLET BY MOUTH  DAILY (Patient taking differently: Take 50 mg by mouth daily. )  . meclizine (ANTIVERT) 25 MG tablet TAKE 1 TABLET BY MOUTH  DAILY AS NEEDED FOR  DIZZINESS. (Patient taking differently: Take 25 mg by mouth daily as needed for dizziness. )  . nitroGLYCERIN (NITROSTAT) 0.4 MG SL tablet Place 1 tablet (0.4 mg total) under the tongue every 5 (five) minutes as needed for chest pain. UP TO 3 DOSES. CALL 911 IF CHEST  PAIN PERSISTS  . pantoprazole (PROTONIX) 40 MG tablet TAKE 1 TABLET BY MOUTH  DAILY (Patient taking differently: Take 40 mg by mouth daily. )  . vitamin B-12 (CYANOCOBALAMIN) 100 MCG tablet Take 100 mcg by mouth daily.   Alveda Reasons 20 MG TABS tablet TAKE 1 TABLET BY MOUTH  DAILY WITH SUPPER (Patient taking differently: Take 20 mg by mouth daily with supper. )  . [DISCONTINUED] clopidogrel (PLAVIX) 75 MG tablet Take 1 tablet (75 mg total) by mouth daily.   No facility-administered encounter medications on file as of 02/15/2019.      Activities of Daily Living In your present state of health, do you have any difficulty performing the following activities: 02/15/2019 11/22/2018  Hearing? N -  Vision? N -  Difficulty concentrating or making decisions? N -  Walking or climbing stairs? N -  Dressing or bathing? N -  Doing errands, shopping? N N  Preparing Food and eating ? N -  Using the Toilet? N -  In the past six months, have you accidently leaked urine? N -  Do you have problems with loss of bowel control? N -  Managing your Medications? N -  Managing your Finances? N -  Housekeeping or managing your Housekeeping? N -  Some recent data might be hidden    Patient Care Team: [name], MD as PCP - General (Internal Medicine) End, Harrell Gave, MD as PCP - Cardiology (Cardiology) Zadie Rhine Clent Demark, MD as Consulting Physician (Ophthalmology) Short, Laurie Panda, RN as Registered Nurse Pyrtle, Lajuan Lines, MD as Consulting Physician (Gastroenterology)   Assessment:   This is a routine wellness examination for [place]. Physical assessment deferred to PCP.   Exercise Activities and Dietary recommendations Current Exercise Habits: Home exercise routine, Type of exercise: walking, Time (Minutes): 60, Frequency (Times/Week): 6, Weekly Exercise (Minutes/Week): 360, Intensity: Mild, Exercise limited by: None identified  Diet (meal preparation, eat out, water intake, caffeinated beverages, dairy products, fruits and vegetables): in general, a "healthy" diet  , well balanced. eats a variety of fruits and vegetables daily, limits salt, fat/cholesterol, sugar,carbohydrates,caffeine, drinks 6-8 glasses of water daily.  Goals   None     Fall Risk Fall Risk  02/15/2019 08/26/2018 01/17/2018 01/15/2017 01/10/2016  Falls in the past year? 0 0 No No No  Number falls in past yr: 0 - - - -  Injury with Fall? 0 - - - -  Follow up Falls prevention discussed - - - -   Is the patient's home free of loose throw rugs in walkways, pet  beds, electrical cords, etc?   yes      Grab bars in the bathroom? yes      Handrails on the stairs?   yes      Adequate lighting?   yes  Depression Screen PHQ 2/9 Scores 02/15/2019 08/26/2018 01/17/2018 01/15/2017  PHQ - 2 Score 0 0 0 0    Cognitive Function       Ad8 score reviewed for issues:  Issues making decisions: no  Less interest in hobbies / activities: no  Repeats questions, stories (family complaining): no  Trouble using ordinary gadgets (microwave, computer, phone):no  Forgets the month or year: no  Mismanaging finances: no  Remembering appts: no  Daily problems with thinking and/or memory: no Ad8 score is= 0  Immunization History  Administered Date(s) Administered  . Influenza, High Dose Seasonal PF 01/10/2016, 01/17/2018  . Influenza,inj,Quad PF,6+ Mos 01/04/2015  . Influenza-Unspecified 12/08/2013  . Pneumococcal Conjugate-13 01/04/2015  . Pneumococcal-Unspecified 12/28/2012  . Tdap 12/29/2010  . Zoster 10/12/2014   Screening Tests Health Maintenance  Topic Date Due  . COLONOSCOPY  05/03/2020  . TETANUS/TDAP  12/28/2020  . INFLUENZA VACCINE  Completed  . PNA vac Low Risk Adult  Completed      Plan:    Reviewed health maintenance screenings with patient today and relevant education, vaccines, and/or referrals were provided.   Continue to eat heart healthy diet (full of fruits, vegetables, whole grains, lean protein, water--limit salt, fat, and sugar intake) and increase physical activity as tolerated.  Continue doing brain stimulating activities (puzzles, reading, adult coloring books, staying active) to keep memory sharp.   I have personally reviewed and  noted the following in the patient's chart:   . Medical and social history . Use of alcohol, tobacco or illicit drugs  . Current medications and supplements . Functional ability and status . Nutritional status . Physical activity . Advanced directives . List of other physicians .  Screenings to include cognitive, depression, and falls . Referrals and appointments  In addition, I have reviewed and discussed with patient certain preventive protocols, quality metrics, and best practice recommendations. A written personalized care plan for preventive services as well as general preventive health recommendations were provided to patient.     Michiel Cowboy, RN  02/15/2019

## 2019-02-15 ENCOUNTER — Ambulatory Visit (INDEPENDENT_AMBULATORY_CARE_PROVIDER_SITE_OTHER): Payer: Medicare Other | Admitting: *Deleted

## 2019-02-15 ENCOUNTER — Other Ambulatory Visit: Payer: Self-pay

## 2019-02-15 DIAGNOSIS — Z0000 Encounter for general adult medical examination without abnormal findings: Secondary | ICD-10-CM | POA: Diagnosis not present

## 2019-02-15 NOTE — Progress Notes (Signed)
Medical screening examination/treatment/procedure(s) were performed by non-physician practitioner and as supervising physician I was immediately available for consultation/collaboration. I agree with above. Demitrius Crass A Nyree Yonker, MD 

## 2019-02-20 DIAGNOSIS — H353221 Exudative age-related macular degeneration, left eye, with active choroidal neovascularization: Secondary | ICD-10-CM | POA: Diagnosis not present

## 2019-02-20 DIAGNOSIS — H353211 Exudative age-related macular degeneration, right eye, with active choroidal neovascularization: Secondary | ICD-10-CM | POA: Diagnosis not present

## 2019-02-27 ENCOUNTER — Ambulatory Visit (INDEPENDENT_AMBULATORY_CARE_PROVIDER_SITE_OTHER): Payer: Medicare Other

## 2019-02-27 DIAGNOSIS — Z95810 Presence of automatic (implantable) cardiac defibrillator: Secondary | ICD-10-CM | POA: Diagnosis not present

## 2019-02-27 DIAGNOSIS — I5022 Chronic systolic (congestive) heart failure: Secondary | ICD-10-CM | POA: Diagnosis not present

## 2019-02-27 NOTE — Progress Notes (Signed)
EPIC Encounter for ICM Monitoring  Patient Name: Mark Clayton is a 78 y.o. male Date: 02/27/2019 Primary Care Physican: [name], MD Cardiologist:End Electrophysiologist: Caryl Comes Last Weight: 161 - 162 lbs   Spoke with patient.  He voiced no complaints and feeling fine at this time.  Did have slight swelling during decreased impedance but none at this time.   OptivolThoracic impedancenormal.  Prescribed:Furosemide 40 mg 1 tablet daily. Dr Caryl Comes advised patient can take extra Furosemide when needed (see 04/14/2018 phone note).   Labs: 12/02/2018 Creatinine1.28Retta Diones, Potassium4.3, [id number], [id number] 11/23/2018 Creatinine1.22, BUN13, Potassium3.9, Sodium139, GFR57->60  11/16/2018 Creatinine1.19, BUN10, Potassium4.5, Sodium141, [id number] 08/26/2018 Creatinine 1.27, BUN 16, Potassium 4.2, Sodium 140, GFR 54.89 Acomplete set of results can be found in Results Review.  Recommendations: No changes and encouraged to call if experiencing any fluid symptoms.  Follow-up plan: ICM clinic phone appointment on 04/10/2019.   Office appt 03/28/2019 with Dr. Caryl Comes.    Copy of ICM check sent to Dr. Caryl Comes.   3 month ICM trend: 02/27/2019    1 Year ICM trend:       Rosalene Billings, RN 02/27/2019 4:11 PM

## 2019-03-03 ENCOUNTER — Other Ambulatory Visit: Payer: Self-pay | Admitting: Internal Medicine

## 2019-03-07 ENCOUNTER — Encounter: Payer: Medicare Other | Admitting: Internal Medicine

## 2019-03-13 ENCOUNTER — Ambulatory Visit (INDEPENDENT_AMBULATORY_CARE_PROVIDER_SITE_OTHER): Payer: Medicare Other | Admitting: Internal Medicine

## 2019-03-13 ENCOUNTER — Other Ambulatory Visit (INDEPENDENT_AMBULATORY_CARE_PROVIDER_SITE_OTHER): Payer: Medicare Other

## 2019-03-13 ENCOUNTER — Other Ambulatory Visit: Payer: Self-pay

## 2019-03-13 ENCOUNTER — Encounter: Payer: Self-pay | Admitting: Internal Medicine

## 2019-03-13 VITALS — BP 110/72 | HR 56 | Temp 97.6°F | Ht 72.0 in | Wt 167.0 lb

## 2019-03-13 DIAGNOSIS — Z0000 Encounter for general adult medical examination without abnormal findings: Secondary | ICD-10-CM

## 2019-03-13 DIAGNOSIS — D509 Iron deficiency anemia, unspecified: Secondary | ICD-10-CM | POA: Diagnosis not present

## 2019-03-13 DIAGNOSIS — I5022 Chronic systolic (congestive) heart failure: Secondary | ICD-10-CM

## 2019-03-13 DIAGNOSIS — I10 Essential (primary) hypertension: Secondary | ICD-10-CM

## 2019-03-13 LAB — COMPREHENSIVE METABOLIC PANEL
ALT: 8 U/L (ref 0–53)
AST: 14 U/L (ref 0–37)
Albumin: 4.1 g/dL (ref 3.5–5.2)
Alkaline Phosphatase: 52 U/L (ref 39–117)
BUN: 13 mg/dL (ref 6–23)
CO2: 29 mEq/L (ref 19–32)
Calcium: 9.4 mg/dL (ref 8.4–10.5)
Chloride: 104 mEq/L (ref 96–112)
Creatinine, Ser: 1.13 mg/dL (ref 0.40–1.50)
GFR: 62.72 mL/min (ref >60.00)
Glucose, Bld: 124 mg/dL — ABNORMAL HIGH (ref 70–99)
Potassium: 4.1 mEq/L (ref 3.5–5.1)
Sodium: 140 mEq/L (ref 135–145)
Total Bilirubin: 1 mg/dL (ref 0.2–1.2)
Total Protein: 7 g/dL (ref 6.0–8.3)

## 2019-03-13 LAB — CBC
HCT: 39 % (ref 39.0–52.0)
Hemoglobin: 12.6 g/dL — ABNORMAL LOW (ref 13.0–17.0)
MCHC: 32.2 g/dL (ref 30.0–36.0)
MCV: 86 fl (ref 78.0–100.0)
Platelets: 177 10*3/uL (ref 150.0–400.0)
RBC: 4.54 Mil/uL (ref 4.22–5.81)
RDW: 15.1 % (ref 11.5–15.5)
WBC: 4.9 10*3/uL (ref 4.0–10.5)

## 2019-03-13 LAB — BRAIN NATRIURETIC PEPTIDE: Pro B Natriuretic peptide (BNP): 410 pg/mL — ABNORMAL HIGH (ref 0.0–100.0)

## 2019-03-13 NOTE — Assessment & Plan Note (Signed)
BP at goal, checking CMP and adjust as needed. Coreg and lasix and imdur and losartan.

## 2019-03-13 NOTE — Patient Instructions (Signed)

## 2019-03-13 NOTE — Assessment & Plan Note (Signed)
Checking CBC, is on xarelto.

## 2019-03-13 NOTE — Assessment & Plan Note (Signed)
Flu shot up to date. Pneumonia complete. Shingrix counseled. Tetanus up to date. Colonoscopy due 2022. Counseled about sun safety and mole surveillance. Counseled about the dangers of distracted driving. Given 10 year screening recommendations.

## 2019-03-13 NOTE — Progress Notes (Signed)
   Subjective:   Patient ID: Mark Clayton, male    DOB: [birth year], 78 y.o.   MRN: [id number]  HPI The patient is a 78 YO man coming in for physical. Overall doing well, working with Dr. Zadie Rhine about his vision, lost sight for 2 weeks earlier this year and working with him on this for about 5 months.   PMH, [facility], social history reviewed and updated  Review of Systems  Constitutional: Negative.   HENT: Negative.   Eyes: Positive for visual disturbance.  Respiratory: Negative for cough, chest tightness and shortness of breath.   Cardiovascular: Negative for chest pain, palpitations and leg swelling.  Gastrointestinal: Negative for abdominal distention, abdominal pain, constipation, diarrhea, nausea and vomiting.  Musculoskeletal: Negative.   Skin: Negative.   Neurological: Negative.   Psychiatric/Behavioral: Negative.     Objective:  Physical Exam Constitutional:      Appearance: He is well-developed.  HENT:     Head: Normocephalic and atraumatic.  Cardiovascular:     Rate and Rhythm: Normal rate and regular rhythm.  Pulmonary:     Effort: Pulmonary effort is normal. No respiratory distress.     Breath sounds: Normal breath sounds. No wheezing or rales.  Abdominal:     General: Bowel sounds are normal. There is no distension.     Palpations: Abdomen is soft.     Tenderness: There is no abdominal tenderness. There is no rebound.  Musculoskeletal:     Cervical back: Normal range of motion.  Skin:    General: Skin is warm and dry.  Neurological:     Mental Status: He is alert and oriented to person, place, and time.     Coordination: Coordination normal.     Vitals:   03/13/19 0954  BP: 110/72  Pulse: (!) 56  Temp: 97.6 F (36.4 C)  TempSrc: Oral  SpO2: 97%  Weight: 167 lb (75.8 kg)  Height: 6' (1.829 m)    This visit occurred during the SARS-CoV-2 public health emergency.  Safety protocols were in place, including screening questions prior to the visit,  additional usage of staff PPE, and extensive cleaning of exam room while observing appropriate contact time as indicated for disinfecting solutions.   Assessment & Plan:

## 2019-03-21 ENCOUNTER — Telehealth: Payer: Self-pay | Admitting: Radiology

## 2019-03-21 NOTE — Telephone Encounter (Signed)
Exercise treadmill test was ordered June 2020 when treadmill room was shut down due to Covid. Please advise if patient still needs test scheduled.  If no longer needed please cancel the order

## 2019-03-22 NOTE — Telephone Encounter (Signed)
Initially, MD ordered GXT for chronotropic excursion evaluation per note on 6/19. Per updated note prior to pt's generator change out on 8/11, "We will undertake a ZIO Patch to look for chronotropic excursion.  In the event that he is significantly limited, would consider atrial upgrade.  At this time could consider CRT upgrade." Pt had Zio Patch completed in August eith Gen change in Sept.   I will cancel GXT, but forward to MD to review.

## 2019-03-28 ENCOUNTER — Other Ambulatory Visit: Payer: Self-pay

## 2019-03-28 ENCOUNTER — Ambulatory Visit (INDEPENDENT_AMBULATORY_CARE_PROVIDER_SITE_OTHER): Payer: Medicare Other | Admitting: Internal Medicine

## 2019-03-28 ENCOUNTER — Encounter: Payer: Self-pay | Admitting: Internal Medicine

## 2019-03-28 VITALS — BP 110/64 | HR 61 | Ht 72.0 in | Wt 169.2 lb

## 2019-03-28 DIAGNOSIS — I4819 Other persistent atrial fibrillation: Secondary | ICD-10-CM | POA: Diagnosis not present

## 2019-03-28 DIAGNOSIS — I255 Ischemic cardiomyopathy: Secondary | ICD-10-CM

## 2019-03-28 DIAGNOSIS — Z95810 Presence of automatic (implantable) cardiac defibrillator: Secondary | ICD-10-CM | POA: Diagnosis not present

## 2019-03-28 DIAGNOSIS — I5022 Chronic systolic (congestive) heart failure: Secondary | ICD-10-CM

## 2019-03-28 LAB — LDL CHOLESTEROL, DIRECT: LDL Direct: 104 mg/dL — ABNORMAL HIGH (ref 0–99)

## 2019-03-28 NOTE — Patient Instructions (Signed)
Medication Instructions:  Your physician recommends that you continue on your current medications as directed. Please refer to the Current Medication list given to you today.  Labwork: Direct LDL today  Testing/Procedures: None ordered.  Follow-Up: Your physician wants you to follow-up in: 9 months You will receive a reminder letter in the mail two months in advance. If you don't receive a letter, please call our office to schedule the follow-up appointment.  Remote monitoring is used to monitor your ICD from home. This monitoring reduces the number of office visits required to check your device to one time per year. It allows [place] to keep an eye on the functioning of your device to ensure it is working properly.  + Any Other Special Instructions Will Be Listed Below (If Applicable).  You were referred to Dr Marlou Porch at [facility].    If you need a refill on your cardiac medications before your next appointment, please call your pharmacy.

## 2019-03-28 NOTE — Progress Notes (Signed)
Electrophysiology Office Note   Date:  03/28/2019   ID:  Mena, Michelotti February 05, 1941, MRN [id number]  PCP:  [name], MD  Cardiologist:   Primary Electrophysiologist:  Virl Axe, MD    No chief complaint on file.    History of Present Illness:  Mark Clayton is a 78 y.o. male is seen in follow-up for an ICD implanted for primary prevention. He had a 6949-lead which was replaced with a right ventricular pace sense lead at the time of generator replacement.  He has ischemic heart disease with prior bypass surgery.  Underwent stenting 6/16 with significant interval improvement   Persistent atrial fibrillation.  He had a failed cardioversion but then reverted spontaneously to sinus rhythm. Severe left atrial enlargement and elected not to undertake dofetilide and was started as an outpatient on amiodarone.  (6/19-AF clinic) he was intolerant because of weakness and shortness of breath.  He stopped it on his own.   Most recently he was initiated on dofetilide.  The patient denies chest pain, shortness of breath, nocturnal dyspnea, orthopnea or peripheral edema.  There have been no palpitations, lightheadedness or syncope.     No bleeding of which he is aware; no afib      Date Procedure Brand-Generator Comments  10/04* ICD single Medtronic  6949  2010 Gen Change 5076 V lead Medtronic R/S lead for 6949  9/20 ICD Gen Change Medtronic       DATE TEST EF    /14    Echo 20%   6/16    Myoview 30 %   6/16 Cath 20%  Patent LIMA to LAD. Patent jump graft SVG to OM 2 and OM 3. Patent SVG to PDA. The second portion of this graft which goes to the posterior lateral artery is occluded. >90% distal RCA stenosis  Successful drug-eluting stent    6/19 Echo 20-35% MR Mod  LAE 54/2.5/.54)   Date Cr K TSH Hgb  2/18   4.0  11.4  7/18 1.12      5/19 1.13 3.9 4.93 11.5  7/19 1.4 4.5    5/20 1.27 4.2 4.88 12.1  12/20 1.13 4.1  12.6        Past Medical History:    Diagnosis Date  . AICD (automatic cardioverter/defibrillator) present 01/17/2003   Medtronic Maximo 7232CX ICD, serial [id number]  . Anemia 02-06-11   takes oral iron  . Arthritis    hands, knees  . CAD (coronary artery disease) 2003   a. h/o MI and CABG in 2003. b. s/p DES to SVG-RPDA-RPLB in 08/2014.  Marland Kitchen Cancer of sigmoid colon ([place]) 2012   a. s/p colon surgery.  . Carotid bruit   . Chronic systolic CHF (congestive heart failure) (HCC)    a. EF 20% in 2014; b. 08/2017 Echo: EF 20-25%, diff HK, Gr3 DD. Triv AI. Mod MR. Sev dil LA. Mildly dil RV w/ mildly reduced RV fxn. Mildly dil RA. Mod TR. PASP 78mHg.  Marland Kitchen Cough   . GERD (gastroesophageal reflux disease) 02-06-11  . HTN (hypertension)   . Hyperlipidemia   . Ischemic cardiomyopathy    a. EF 20% in 2014. (Master study EF >20%); b. 08/2017 Echo: EF 20-25%, diff HK. Gr3 DD.  . LV (left ventricular) mural thrombus    a. 12/2012 Echo: EF 20% with mural thrombus No evidence of thrombus on 08/2017 echo.  . Macular degeneration   . Myocardial infarct ([place])    2003  . PAF (  paroxysmal atrial fibrillation) (HCC)    a. CHA2DS2VASc = 5-->Xarelto/Tikosyn.   Past Surgical History:  Procedure Laterality Date  . CARDIAC CATHETERIZATION N/A 09/20/2014   Procedure: Left Heart Cath and Coronary Angiography;  Surgeon: Jettie Booze, MD; LAD 95%, D1 100%, CFX liner percent, OM 200%, OM 390%, RCA 90%, LIMA-LAD okay, SVG-OM 2-OM 3 minimal disease, SVG-RPDA-RPLB 100% between the RPDA and RPL     . CARDIAC CATHETERIZATION N/A 09/20/2014   Procedure: Coronary Stent Intervention;  Surgeon: Jettie Booze, MD; Synergy DES 4 x 24 mm reducing the stenosis to 5%   . CARDIAC DEFIBRILLATOR PLACEMENT  01/17/03   6949 lead. Medtronic. remote-no; with later revision  . CARDIOVERSION N/A 05/22/2016   Procedure: CARDIOVERSION;  Surgeon: Lelon Perla, MD;  Location: [facility] ENDOSCOPY;  Service: Cardiovascular;  Laterality: N/A;  . CARDIOVERSION N/A 08/10/2017    Procedure: CARDIOVERSION;  Surgeon: Pixie Casino, MD;  Location: [facility] ENDOSCOPY;  Service: Cardiovascular;  Laterality: N/A;  . CATARACT EXTRACTION W/ INTRAOCULAR LENS  IMPLANT, BILATERAL Bilateral June/-July 2009   Dr. Katy Fitch  . COLON RESECTION  02/09/2011   Procedure: LAPAROSCOPIC SIGMOID COLON RESECTION;  Surgeon: Pedro Earls, MD;  Location: [facility] ORS;  Service: General;  Laterality: N/A;  Laparoscopic Assisted Sigmoid Colectomy  . COLON SURGERY    . COLONOSCOPY  08/31/2011   Procedure: COLONOSCOPY;  Surgeon: Jerene Bears, MD;  Location: [facility] ENDOSCOPY;  Service: Gastroenterology;  Laterality: N/A;  . COLONOSCOPY N/A 09/05/2012   Procedure: COLONOSCOPY;  Surgeon: Jerene Bears, MD;  Location: [facility] ENDOSCOPY;  Service: Gastroenterology;  Laterality: N/A;  . COLONOSCOPY N/A 04/18/2013   Procedure: COLONOSCOPY;  Surgeon: Jerene Bears, MD;  Location: [facility] ENDOSCOPY;  Service: Gastroenterology;  Laterality: N/A;  . COLONOSCOPY N/A 04/09/2014   Procedure: COLONOSCOPY;  Surgeon: Jerene Bears, MD;  Location: [facility] ENDOSCOPY;  Service: Gastroenterology;  Laterality: N/A;  . COLONOSCOPY WITH PROPOFOL N/A 05/03/2017   Procedure: COLONOSCOPY WITH PROPOFOL;  Surgeon: Jerene Bears, MD;  Location: [facility] ENDOSCOPY;  Service: Gastroenterology;  Laterality: N/A;  . CORONARY ANGIOPLASTY    . CORONARY ARTERY BYPASS GRAFT  01/2002   LIMA-LAD, SVG-OM 2-OM 3, SVG-RPDA-RPLB  . CORONARY STENT INTERVENTION N/A 11/22/2018   Procedure: CORONARY STENT INTERVENTION;  Surgeon: Nelva Bush, MD;  Location: [place] CV LAB;  Service: Cardiovascular;  Laterality: N/A;  SVG - RCA  . ICD GENERATOR CHANGE  2010   Medtronic Virtuoso II VR ICD  . ICD GENERATOR CHANGEOUT N/A 12/07/2018   Procedure: ICD GENERATOR CHANGEOUT;  Surgeon: Deboraha Sprang, MD;  Location: [place] CV LAB;  Service: Cardiovascular;  Laterality: N/A;  . INGUINAL HERNIA REPAIR Right 2000's X 2  . LAPAROSCOPIC RIGHT HEMI COLECTOMY N/A 11/04/2012   Procedure:  LAPAROSCOPIC RIGHT HEMI COLECTOMY;  Surgeon: Pedro Earls, MD;  Location: [facility] ORS;  Service: General;  Laterality: N/A;  . LEFT HEART CATH AND CORS/GRAFTS ANGIOGRAPHY Left 11/22/2018   Procedure: LEFT HEART CATH AND CORS/GRAFTS ANGIOGRAPHY;  Surgeon: Nelva Bush, MD;  Location: [place] CV LAB;  Service: Cardiovascular;  Laterality: Left;  . TONSILLECTOMY  ~ 1950     Current Outpatient Medications  Medication Sig Dispense Refill  . carvedilol (COREG) 6.25 MG tablet Take 1 tablet (6.25 mg total) by mouth 2 (two) times daily with a meal. 60 tablet 6  . clopidogrel (PLAVIX) 75 MG tablet Take 1 tablet (75 mg total) by mouth daily with breakfast. 30 tablet 0  . dofetilide (TIKOSYN) 250  MCG capsule TAKE 1 CAPSULE BY MOUTH TWO TIMES DAILY 180 capsule 1  . furosemide (LASIX) 40 MG tablet TAKE 1 TABLET BY MOUTH  DAILY 90 tablet 1  . isosorbide mononitrate (IMDUR) 30 MG 24 hr tablet TAKE 1 TABLET BY MOUTH  DAILY 90 tablet 3  . losartan (COZAAR) 50 MG tablet TAKE 1 TABLET BY MOUTH  DAILY 90 tablet 1  . meclizine (ANTIVERT) 25 MG tablet TAKE 1 TABLET BY MOUTH  DAILY AS NEEDED FOR  DIZZINESS. 90 tablet 0  . nitroGLYCERIN (NITROSTAT) 0.4 MG SL tablet DISSOLVE 1 TABLET UNDER THE TONGUE EVERY 5 MINUTES AS  NEEDED FOR CHEST PAIN. MAX  OF 3 TABLETS IN 15 MINUTES. CALL 911 IF PAIN PERSISTS. 100 tablet 3  . pantoprazole (PROTONIX) 40 MG tablet TAKE 1 TABLET BY MOUTH  DAILY 90 tablet 1  . vitamin B-12 (CYANOCOBALAMIN) 100 MCG tablet Take 100 mcg by mouth daily.     Alveda Reasons 20 MG TABS tablet TAKE 1 TABLET BY MOUTH  DAILY WITH SUPPER 90 tablet 2  . atorvastatin (LIPITOR) 80 MG tablet Take 1 tablet (80 mg total) by mouth daily. 90 tablet 3   No current facility-administered medications for this visit.    Allergies:   Latex and Tape   Social History:  The patient  reports that he has never smoked. He has never used smokeless tobacco. He reports current alcohol use of about 2.0 standard drinks of  alcohol per week. He reports that he does not use drugs.   Family History:  The patient's family history includes Alzheimer's disease in his mother; Heart disease in his father; Hyperlipidemia in his father and sister; Hypertension in his father and sister; Prostate cancer in his father.    ROS:  Please see the history of present illness and past medical history  Otherwise, all other systems were reviewed and were negative .     PHYSICAL EXAM: VS:  BP 110/64   Pulse 61   Ht 6' (1.829 m)   Wt 169 lb 3.2 oz (76.7 kg)   SpO2 97%   BMI 22.95 kg/m  , BMI Body mass index is 22.95 kg/m. Well developed and well nourished in no acute distress HENT normal Neck supple with JVP-flat Clear Device pocket well healed; without hematoma or erythema.  There is no tethering  Regular rate and rhythm, no  murmur Abd-soft with active BS No Clubbing cyanosis  edema Skin-warm and dry A & Oriented  Grossly normal sensory and motor function  ECG sinus rhythm at 61 Intervals 22/18/49 IVCD PVC   08/26/2018: Magnesium 2.1; TSH 4.88 03/13/2019: ALT 8; BUN 13; Creatinine, Ser 1.13; Hemoglobin 12.6; Platelets 177.0; Potassium 4.1; Pro B Natriuretic peptide (BNP) 410.0; Sodium 140    Lipid Panel     Component Value Date/Time   CHOL 182 11/16/2018 0900   TRIG 120 11/16/2018 0900   HDL 56 11/16/2018 0900   CHOLHDL 3.3 11/16/2018 0900   CHOLHDL 3 01/17/2018 0834   VLDL 18.8 01/17/2018 0834   LDLCALC 102 (H) 11/16/2018 0900     Wt Readings from Last 3 Encounters:  03/28/19 169 lb 3.2 oz (76.7 kg)  03/13/19 167 lb (75.8 kg)  12/07/18 162 lb 8 oz (73.7 kg)      Other studies Reviewed: Additional studies/ records that were reviewed today include:  Myoview scan 2012 demonstrated large scar without ischemia EF was 29%      ASSESSMENT AND PLAN:  Ischemic cardiomyopathy  Implantable defibrillator-Medtronic-single chamber-  Hypertension   IVCD  Atrial fibrillation persistent  Congestive  heart failure-chronic-systolic  PVCs  Anemia-chronic  Bradycardia  Hyperlipidiemia      No symptomatic bradycardia.  Without symptoms of ischemia  On Anticoagulation;  No bleeding issues   Infrequent atrial fibrillation detected by his device  PVCs present but not symptomatic  Blood pressure well controlled  willcheck LDL as was elevated in September.  May need adjunctive therapy.  Hemoglobin is better   Orders Placed This Encounter  Procedures  . EKG 12-Lead      Virl Axe, MD  03/28/2019 9:55 AM     [address] [place] [address] [phone] (office) [phone] (fax)

## 2019-04-03 DIAGNOSIS — H353221 Exudative age-related macular degeneration, left eye, with active choroidal neovascularization: Secondary | ICD-10-CM | POA: Diagnosis not present

## 2019-04-03 DIAGNOSIS — H353211 Exudative age-related macular degeneration, right eye, with active choroidal neovascularization: Secondary | ICD-10-CM | POA: Diagnosis not present

## 2019-04-10 ENCOUNTER — Ambulatory Visit (INDEPENDENT_AMBULATORY_CARE_PROVIDER_SITE_OTHER): Payer: Medicare Other

## 2019-04-10 DIAGNOSIS — I5022 Chronic systolic (congestive) heart failure: Secondary | ICD-10-CM

## 2019-04-10 DIAGNOSIS — Z95810 Presence of automatic (implantable) cardiac defibrillator: Secondary | ICD-10-CM | POA: Diagnosis not present

## 2019-04-11 NOTE — Progress Notes (Signed)
EPIC Encounter for ICM Monitoring  Patient Name: Mark Clayton is a 79 y.o. male Date: 04/11/2019 Primary Care Physican: [name], MD Cardiologist:End (changing to Dr Marlou Porch) Electrophysiologist: Caryl Comes 04/11/2019 Weight: 160 - 162 lbs   Spoke with patient.  He reported having some tightness in fingers during decreased impedance after Christmas but extra fluid pill resolved the swelling. Patient changing from Dr End to Dr Marlou Porch due to easier location to travel to.  OptivolThoracic impedancenormal.  Prescribed:Furosemide 40 mg 1 tablet daily. Dr Caryl Comes advised patient can take extra Furosemide when needed (see 04/14/2018 phone note).   Labs: 03/13/2019 Creatinine 1.13, BUN 13, Potassium 4.1, Sodium 140, GFR 62.72 12/02/2018 Creatinine1.28, BUN12, Potassium4.3, [id number], [id number] 11/23/2018 Creatinine1.22, BUN13, Potassium3.9, Sodium139, GFR57->60  11/16/2018 Creatinine1.19, BUN10, Potassium4.5, Sodium141, [id number] 08/26/2018 Creatinine 1.27, BUN 16, Potassium 4.2, Sodium 140, GFR 54.89 Acomplete set of results can be found in Results Review.  Recommendations:No changes and encouraged to call if experiencing any fluid symptoms.  Follow-up plan: ICM clinic phone appointment on 05/15/2019.   91 day device clinic remote transmission 06/27/2019.  Office appt 04/14/2019 with Dr. Marlou Porch.    Copy of ICM check sent to Dr. Caryl Comes.   3 month ICM trend: 04/10/2019    1 Year ICM trend:       Rosalene Billings, RN 04/11/2019 10:40 AM

## 2019-04-14 ENCOUNTER — Encounter: Payer: Self-pay | Admitting: Cardiology

## 2019-04-14 ENCOUNTER — Ambulatory Visit: Payer: Medicare Other | Admitting: Cardiology

## 2019-04-14 ENCOUNTER — Other Ambulatory Visit: Payer: Self-pay

## 2019-04-14 VITALS — BP 142/70 | HR 65 | Ht 72.0 in | Wt 165.0 lb

## 2019-04-14 DIAGNOSIS — Z79899 Other long term (current) drug therapy: Secondary | ICD-10-CM | POA: Diagnosis not present

## 2019-04-14 DIAGNOSIS — E785 Hyperlipidemia, unspecified: Secondary | ICD-10-CM | POA: Diagnosis not present

## 2019-04-14 DIAGNOSIS — I5022 Chronic systolic (congestive) heart failure: Secondary | ICD-10-CM | POA: Diagnosis not present

## 2019-04-14 DIAGNOSIS — I4819 Other persistent atrial fibrillation: Secondary | ICD-10-CM

## 2019-04-14 MED ORDER — EZETIMIBE 10 MG PO TABS: 10.0000 mg | ORAL_TABLET | Freq: Every day | ORAL | 3 refills | Status: DC

## 2019-04-14 NOTE — Patient Instructions (Signed)
Medication Instructions:  Please start Zetia 10 mg daily.  Continue all other medications as listed.  *If you need a refill on your cardiac medications before your next appointment, please call your pharmacy*  Lab Work: Please have fasting fasting in 3 months (Lipid/ALT)  If you have labs (blood work) drawn today and your tests are completely normal, you will receive your results only by: Marland Kitchen MyChart Message (if you have MyChart) OR . A paper copy in the mail If you have any lab test that is abnormal or we need to change your treatment, we will call you to review the results.  Follow-Up: At [facility], you and your health needs are our priority.  As part of our continuing mission to provide you with exceptional heart care, we have created designated Provider Care Teams.  These Care Teams include your primary Cardiologist (physician) and Advanced Practice Providers (APPs -  Physician Assistants and Nurse Practitioners) who all work together to provide you with the care you need, when you need it.  Your next appointment:   6 month(s)  The format for your next appointment:   In Person  Provider:   Candee Furbish, MD  Thank you for choosing [facility]!!

## 2019-04-14 NOTE — Progress Notes (Signed)
Cardiology Office Note:    Date:  04/14/2019   ID:  [name], DOB Mar 01, 1941, MRN [id number]  PCP:  [name], MD  Cardiologist:  Nelva Bush, MD  Electrophysiologist:  None   Referring MD: [name], *    History of Present Illness:    Mark Clayton is a 79 y.o. male with chronic systolic heart failure secondary to ischemic cardiomyopathy with prior CABG, PCI in 2016 with persistent atrial fibrillation with severe atrial enlargement former patient of Dr. Saunders Revel here for follow-up.  Previously had failed cardioversion then reverted spontaneously to sinus rhythm.  On dofetilide.  Had side effects with amiodarone previously.  Prior echocardiogram 2019 with EF between 20 and 35%.  Moderate mitral regurgitation.  Cardiac catheterization 2016 reviewed.  Patent LIMA to LAD, SVG to OM and OM 3 jump graft patent.  SVG to PDA patent.  Second portion of the graft which goes to the posterior lateral artery was occluded.  He had greater than 90% distal RCA stenosis successfully stented.  Used to see Dr. Albertine Patricia. Carrying box, ran hand into door, busted it up he states, sore abdominal muscles.  Overall he walks 2 to 3 miles a day without any anginal symptoms.  No significant shortness of breath.     Past Medical History:  Diagnosis Date  . AICD (automatic cardioverter/defibrillator) present 01/17/2003   Medtronic Maximo 7232CX ICD, serial [id number]  . Anemia 02-06-11   takes oral iron  . Arthritis    hands, knees  . CAD (coronary artery disease) 2003   a. h/o MI and CABG in 2003. b. s/p DES to SVG-RPDA-RPLB in 08/2014.  Marland Kitchen Cancer of sigmoid colon ([place]) 2012   a. s/p colon surgery.  . Carotid bruit   . Chronic systolic CHF (congestive heart failure) (HCC)    a. EF 20% in 2014; b. 08/2017 Echo: EF 20-25%, diff HK, Gr3 DD. Triv AI. Mod MR. Sev dil LA. Mildly dil RV w/ mildly reduced RV fxn. Mildly dil RA. Mod TR. PASP 28mHg.  Marland Kitchen Cough   . GERD (gastroesophageal  reflux disease) 02-06-11  . HTN (hypertension)   . Hyperlipidemia   . Ischemic cardiomyopathy    a. EF 20% in 2014. (Master study EF >20%); b. 08/2017 Echo: EF 20-25%, diff HK. Gr3 DD.  . LV (left ventricular) mural thrombus    a. 12/2012 Echo: EF 20% with mural thrombus No evidence of thrombus on 08/2017 echo.  . Macular degeneration   . Myocardial infarct ([place])    2003  . PAF (paroxysmal atrial fibrillation) (HCC)    a. CHA2DS2VASc = 5-->Xarelto/Tikosyn.    Past Surgical History:  Procedure Laterality Date  . CARDIAC CATHETERIZATION N/A 09/20/2014   Procedure: Left Heart Cath and Coronary Angiography;  Surgeon: Jettie Booze, MD; LAD 95%, D1 100%, CFX liner percent, OM 200%, OM 390%, RCA 90%, LIMA-LAD okay, SVG-OM 2-OM 3 minimal disease, SVG-RPDA-RPLB 100% between the RPDA and RPL     . CARDIAC CATHETERIZATION N/A 09/20/2014   Procedure: Coronary Stent Intervention;  Surgeon: Jettie Booze, MD; Synergy DES 4 x 24 mm reducing the stenosis to 5%   . CARDIAC DEFIBRILLATOR PLACEMENT  01/17/03   6949 lead. Medtronic. remote-no; with later revision  . CARDIOVERSION N/A 05/22/2016   Procedure: CARDIOVERSION;  Surgeon: Lelon Perla, MD;  Location: [facility] ENDOSCOPY;  Service: Cardiovascular;  Laterality: N/A;  . CARDIOVERSION N/A 08/10/2017   Procedure: CARDIOVERSION;  Surgeon: Pixie Casino, MD;  Location: [facility]  ENDOSCOPY;  Service: Cardiovascular;  Laterality: N/A;  . CATARACT EXTRACTION W/ INTRAOCULAR LENS  IMPLANT, BILATERAL Bilateral June/-July 2009   Dr. Katy Fitch  . COLON RESECTION  02/09/2011   Procedure: LAPAROSCOPIC SIGMOID COLON RESECTION;  Surgeon: Pedro Earls, MD;  Location: [facility] ORS;  Service: General;  Laterality: N/A;  Laparoscopic Assisted Sigmoid Colectomy  . COLON SURGERY    . COLONOSCOPY  08/31/2011   Procedure: COLONOSCOPY;  Surgeon: Jerene Bears, MD;  Location: [facility] ENDOSCOPY;  Service: Gastroenterology;  Laterality: N/A;  . COLONOSCOPY N/A 09/05/2012   Procedure:  COLONOSCOPY;  Surgeon: Jerene Bears, MD;  Location: [facility] ENDOSCOPY;  Service: Gastroenterology;  Laterality: N/A;  . COLONOSCOPY N/A 04/18/2013   Procedure: COLONOSCOPY;  Surgeon: Jerene Bears, MD;  Location: [facility] ENDOSCOPY;  Service: Gastroenterology;  Laterality: N/A;  . COLONOSCOPY N/A 04/09/2014   Procedure: COLONOSCOPY;  Surgeon: Jerene Bears, MD;  Location: [facility] ENDOSCOPY;  Service: Gastroenterology;  Laterality: N/A;  . COLONOSCOPY WITH PROPOFOL N/A 05/03/2017   Procedure: COLONOSCOPY WITH PROPOFOL;  Surgeon: Jerene Bears, MD;  Location: [facility] ENDOSCOPY;  Service: Gastroenterology;  Laterality: N/A;  . CORONARY ANGIOPLASTY    . CORONARY ARTERY BYPASS GRAFT  01/2002   LIMA-LAD, SVG-OM 2-OM 3, SVG-RPDA-RPLB  . CORONARY STENT INTERVENTION N/A 11/22/2018   Procedure: CORONARY STENT INTERVENTION;  Surgeon: Nelva Bush, MD;  Location: [place] CV LAB;  Service: Cardiovascular;  Laterality: N/A;  SVG - RCA  . ICD GENERATOR CHANGE  2010   Medtronic Virtuoso II VR ICD  . ICD GENERATOR CHANGEOUT N/A 12/07/2018   Procedure: ICD GENERATOR CHANGEOUT;  Surgeon: Deboraha Sprang, MD;  Location: [place] CV LAB;  Service: Cardiovascular;  Laterality: N/A;  . INGUINAL HERNIA REPAIR Right 2000's X 2  . LAPAROSCOPIC RIGHT HEMI COLECTOMY N/A 11/04/2012   Procedure: LAPAROSCOPIC RIGHT HEMI COLECTOMY;  Surgeon: Pedro Earls, MD;  Location: [facility] ORS;  Service: General;  Laterality: N/A;  . LEFT HEART CATH AND CORS/GRAFTS ANGIOGRAPHY Left 11/22/2018   Procedure: LEFT HEART CATH AND CORS/GRAFTS ANGIOGRAPHY;  Surgeon: Nelva Bush, MD;  Location: [place] CV LAB;  Service: Cardiovascular;  Laterality: Left;  . TONSILLECTOMY  ~ 1950    Current Medications: Current Meds  Medication Sig  . carvedilol (COREG) 6.25 MG tablet Take 1 tablet (6.25 mg total) by mouth 2 (two) times daily with a meal.  . clopidogrel (PLAVIX) 75 MG tablet Take 1 tablet (75 mg total) by mouth daily with breakfast.  . dofetilide  (TIKOSYN) 250 MCG capsule TAKE 1 CAPSULE BY MOUTH TWO TIMES DAILY  . furosemide (LASIX) 40 MG tablet TAKE 1 TABLET BY MOUTH  DAILY  . isosorbide mononitrate (IMDUR) 30 MG 24 hr tablet TAKE 1 TABLET BY MOUTH  DAILY  . losartan (COZAAR) 50 MG tablet TAKE 1 TABLET BY MOUTH  DAILY  . meclizine (ANTIVERT) 25 MG tablet TAKE 1 TABLET BY MOUTH  DAILY AS NEEDED FOR  DIZZINESS.  . nitroGLYCERIN (NITROSTAT) 0.4 MG SL tablet DISSOLVE 1 TABLET UNDER THE TONGUE EVERY 5 MINUTES AS  NEEDED FOR CHEST PAIN. MAX  OF 3 TABLETS IN 15 MINUTES. CALL 911 IF PAIN PERSISTS.  Marland Kitchen pantoprazole (PROTONIX) 40 MG tablet TAKE 1 TABLET BY MOUTH  DAILY  . vitamin B-12 (CYANOCOBALAMIN) 100 MCG tablet Take 100 mcg by mouth daily.   Alveda Reasons 20 MG TABS tablet TAKE 1 TABLET BY MOUTH  DAILY WITH SUPPER     Allergies:   Latex and Tape   Social  History   Socioeconomic History  . Marital status: Widowed    Spouse name: Not on file  . Number of children: 3  . Years of education: Not on file  . Highest education level: Not on file  Occupational History  . Occupation: self Physiological scientist  Tobacco Use  . Smoking status: Never Smoker  . Smokeless tobacco: Never Used  Substance and Sexual Activity  . Alcohol use: Yes    Alcohol/week: 2.0 standard drinks    Types: 2 Cans of beer per week  . Drug use: No  . Sexual activity: Not Currently  Other Topics Concern  . Not on file  Social History Narrative   Full time. Married.    Social Determinants of Health   Financial Resource Strain: Low Risk   . Difficulty of Paying Living Expenses: Not hard at all  Food Insecurity: No Food Insecurity  . Worried About [name] fundraiser in the Last Year: Never true  . Ran Out of Food in the Last Year: Never true  Transportation Needs: No Transportation Needs  . Lack of Transportation (Medical): No  . Lack of Transportation (Non-Medical): No  Physical Activity: Sufficiently Active  . Days of Exercise per Week: 6 days  . Minutes of  Exercise per Session: 40 min  Stress:   . Feeling of Stress : Not on file  Social Connections: Slightly Isolated  . Frequency of Communication with Friends and Family: More than three times a week  . Frequency of Social Gatherings with Friends and Family: More than three times a week  . Attends Religious Services: More than 4 times per year  . Active Member of Clubs or Organizations: Yes  . Attends Archivist Meetings: More than 4 times per year  . Marital Status: Widowed     Family History: The patient's family history includes Alzheimer's disease in his mother; Heart disease in his father; Hyperlipidemia in his father and sister; Hypertension in his father and sister; Prostate cancer in his father. There is no history of Colon cancer, Esophageal cancer, Rectal cancer, or Stomach cancer.  ROS:   Please see the history of present illness.    No fevers chills nausea vomiting syncope bleeding all other systems reviewed and are negative.  EKGs/Labs/Other Studies Reviewed:    The following studies were reviewed today: Prior echo cath as above  EKG:  EKG is not ordered today.    Recent Labs: 08/26/2018: Magnesium 2.1; TSH 4.88 03/13/2019: ALT 8; BUN 13; Creatinine, Ser 1.13; Hemoglobin 12.6; Platelets 177.0; Potassium 4.1; Pro B Natriuretic peptide (BNP) 410.0; Sodium 140  Recent Lipid Panel    Component Value Date/Time   CHOL 182 11/16/2018 0900   TRIG 120 11/16/2018 0900   HDL 56 11/16/2018 0900   CHOLHDL 3.3 11/16/2018 0900   CHOLHDL 3 01/17/2018 0834   VLDL 18.8 01/17/2018 0834   LDLCALC 102 (H) 11/16/2018 0900   LDLDIRECT 104 (H) 03/28/2019 1018    Physical Exam:    VS:  BP (!) 142/70   Pulse 65   Ht 6' (1.829 m)   Wt 165 lb (74.8 kg)   SpO2 98%   BMI 22.38 kg/m     Wt Readings from Last 3 Encounters:  04/14/19 165 lb (74.8 kg)  03/28/19 169 lb 3.2 oz (76.7 kg)  03/13/19 167 lb (75.8 kg)     GEN:  Well nourished, well developed in no acute  distress HEENT: Normal NECK: No JVD; No carotid bruits LYMPHATICS: No  lymphadenopathy CARDIAC: RRR, no murmurs, rubs, gallops RESPIRATORY:  Clear to auscultation without rales, wheezing or rhonchi  ABDOMEN: Soft, non-tender, non-distended MUSCULOSKELETAL:  No edema; No deformity  SKIN: Warm and dry, torn skin on right hand NEUROLOGIC:  Alert and oriented x 3 PSYCHIATRIC:  Normal affect   ASSESSMENT:    1. Medication management   2. Hyperlipidemia LDL goal <70   3. Chronic systolic CHF (congestive heart failure) (HCC)   4. Persistent atrial fibrillation    PLAN:    In order of problems listed above:  Chronic systolic heart failure with ischemic cardiomyopathy -On carvedilol losartan atorvastatin.  Continue.  NYHA class I symptoms.  EF remains severely reduced.  Low-salt.   ICD -Dr. Caryl Comes has been following.  No defibrillations.  Essential hypertension -Creatinine 1.13, overall reasonable control.  Atrial fibrillation persistent -Infrequent atrial fibrillation noted by his device.  Dr. Caryl Comes following. -Xarelto 20 mg.  No bleeding.  Last CBC shows hemoglobin 12.6  Hyperlipidemia -Atorvastatin 80 mg LDL goal 70.  From outside labs in December 2020 LDL was 104. -We will add Zetia 10 mg once a day to his atorvastatin.  Hopefully this will get him at goal.  He has been on this medication before does not remember why it was stopped or switched.  Probably 2 attempt another statin. -In 3 months check lipid panel and ALT.  Coronary artery disease status post CABG/angina well-controlled -Excellent goal-directed therapy.  He is on Plavix monotherapy.  He is on isosorbide for anginal symptoms.  Doing very well.    Medication Adjustments/Labs and Tests Ordered: Current medicines are reviewed at length with the patient today.  Concerns regarding medicines are outlined above.  Orders Placed This Encounter  Procedures  . ALT  . Lipid Profile   Meds ordered this encounter   Medications  . DISCONTD: ezetimibe (ZETIA) 10 MG tablet    Sig: Take 1 tablet (10 mg total) by mouth daily.    Dispense:  90 tablet    Refill:  3  . ezetimibe (ZETIA) 10 MG tablet    Sig: Take 1 tablet (10 mg total) by mouth daily.    Dispense:  90 tablet    Refill:  3    Patient Instructions  Medication Instructions:  Please start Zetia 10 mg daily.  Continue all other medications as listed.  *If you need a refill on your cardiac medications before your next appointment, please call your pharmacy*  Lab Work: Please have fasting fasting in 3 months (Lipid/ALT)  If you have labs (blood work) drawn today and your tests are completely normal, you will receive your results only by: Marland Kitchen MyChart Message (if you have MyChart) OR . A paper copy in the mail If you have any lab test that is abnormal or we need to change your treatment, we will call you to review the results.  Follow-Up: At [facility], you and your health needs are our priority.  As part of our continuing mission to provide you with exceptional heart care, we have created designated Provider Care Teams.  These Care Teams include your primary Cardiologist (physician) and Advanced Practice Providers (APPs -  Physician Assistants and Nurse Practitioners) who all work together to provide you with the care you need, when you need it.  Your next appointment:   6 month(s)  The format for your next appointment:   In Person  Provider:   Candee Furbish, MD  Thank you for choosing [facility]!!  Signed, Candee Furbish, MD  04/14/2019 9:21 AM    [facility]

## 2019-04-26 ENCOUNTER — Ambulatory Visit (INDEPENDENT_AMBULATORY_CARE_PROVIDER_SITE_OTHER): Payer: Medicare Other | Admitting: *Deleted

## 2019-04-26 DIAGNOSIS — I255 Ischemic cardiomyopathy: Secondary | ICD-10-CM | POA: Diagnosis not present

## 2019-04-26 LAB — CUP PACEART REMOTE DEVICE CHECK
Battery Remaining Longevity: 133 mo
Battery Voltage: 3.07 V
Brady Statistic RV Percent Paced: 1.7 %
Date Time Interrogation Session: 20210127074738
HighPow Impedance: 43 Ohm
HighPow Impedance: 55 Ohm
Implantable Lead Implant Date: 20041020
Implantable Lead Implant Date: 20100913
Implantable Lead Location: 753860
Implantable Lead Location: 753860
Implantable Lead Model: 5076
Implantable Lead Model: 6949
Implantable Pulse Generator Implant Date: 20200909
Lead Channel Impedance Value: 304 Ohm
Lead Channel Impedance Value: 418 Ohm
Lead Channel Pacing Threshold Amplitude: 2.25 V
Lead Channel Pacing Threshold Pulse Width: 0.4 ms
Lead Channel Sensing Intrinsic Amplitude: 31.625 mV
Lead Channel Sensing Intrinsic Amplitude: 31.625 mV
Lead Channel Setting Pacing Amplitude: 2.5 V
Lead Channel Setting Pacing Pulse Width: 0.8 ms
Lead Channel Setting Sensing Sensitivity: 0.45 mV

## 2019-05-15 ENCOUNTER — Ambulatory Visit (INDEPENDENT_AMBULATORY_CARE_PROVIDER_SITE_OTHER): Payer: Medicare Other

## 2019-05-15 DIAGNOSIS — I5022 Chronic systolic (congestive) heart failure: Secondary | ICD-10-CM | POA: Diagnosis not present

## 2019-05-15 DIAGNOSIS — Z95810 Presence of automatic (implantable) cardiac defibrillator: Secondary | ICD-10-CM | POA: Diagnosis not present

## 2019-05-17 NOTE — Progress Notes (Signed)
EPIC Encounter for ICM Monitoring  Patient Name: Mark Clayton is a 79 y.o. male Date: 05/17/2019 Primary Care Physican: [name], MD Cardiologist:End Electrophysiologist: Caryl Comes 05/17/2019 Weight: 165 lbs  Since 26-Apr-2019  AF              6 Episodes Time in AF  24.0 hr/day (99.9%)   OBSERVATIONS (1)  Alert: AF >= 6 hr for 19 days.    Spoke with patient.  He voiced no complaints and feeling fine at this time.  He walks 2 miles every day for exercise.  OptivolThoracic impedancenormal.  Prescribed:Furosemide 40 mg 1 tablet daily.   Labs: 03/13/2019 Creatinine 1.13, BUN 13, Potassium 4.1, Sodium 140, GFR 62.72 12/02/2018 Creatinine1.28, BUN12, Potassium4.3, Sodium138, [id number] Acomplete set of results can be found in Results Review.  Recommendations:No changes and encouraged to call if experiencing any fluid symptoms.  Follow-up plan: ICM clinic phone appointment on3/22/2021. 91 day device clinic remote transmission 07/26/2019.   Copy of ICM check sent to [place].   3 month ICM trend: 05/15/2019    1 Year ICM trend:       Rosalene Billings, RN 05/17/2019 2:44 PM

## 2019-05-29 DIAGNOSIS — H353112 Nonexudative age-related macular degeneration, right eye, intermediate dry stage: Secondary | ICD-10-CM | POA: Diagnosis not present

## 2019-05-29 DIAGNOSIS — H35722 Serous detachment of retinal pigment epithelium, left eye: Secondary | ICD-10-CM | POA: Diagnosis not present

## 2019-05-29 DIAGNOSIS — H353221 Exudative age-related macular degeneration, left eye, with active choroidal neovascularization: Secondary | ICD-10-CM | POA: Diagnosis not present

## 2019-05-29 DIAGNOSIS — H353211 Exudative age-related macular degeneration, right eye, with active choroidal neovascularization: Secondary | ICD-10-CM | POA: Diagnosis not present

## 2019-05-30 ENCOUNTER — Telehealth: Payer: Self-pay | Admitting: Internal Medicine

## 2019-05-30 DIAGNOSIS — I25119 Atherosclerotic heart disease of native coronary artery with unspecified angina pectoris: Secondary | ICD-10-CM

## 2019-05-30 NOTE — Chronic Care Management (AMB) (Signed)
  Chronic Care Management   Note  05/30/2019 Name: Mark Clayton MRN: [id number] DOB: 1941/02/19  Mark Clayton is a 79 y.o. year old male who is a primary care patient of [name], MD. I reached out to [name] by phone today in response to a referral sent by Mark Clayton's PCP, [name], MD.   Mr. Mandigo was given information about Chronic Care Management services today including:  1. CCM service includes personalized support from designated clinical staff supervised by his physician, including individualized plan of care and coordination with other care providers 2. 24/7 contact phone numbers for assistance for urgent and routine care needs. 3. Service will only be billed when office clinical staff spend 20 minutes or more in a month to coordinate care. 4. Only one practitioner may furnish and bill the service in a calendar month. 5. The patient may stop CCM services at any time (effective at the end of the month) by phone call to the office staff. 6. The patient will be responsible for cost sharing (co-pay) of up to 20% of the service fee (after annual deductible is met).  Patient agreed to services and verbal consent obtained.   Follow up plan:   Raynicia Dukes [employer] Scheduler

## 2019-06-02 ENCOUNTER — Ambulatory Visit (INDEPENDENT_AMBULATORY_CARE_PROVIDER_SITE_OTHER): Payer: Medicare Other

## 2019-06-02 ENCOUNTER — Ambulatory Visit (INDEPENDENT_AMBULATORY_CARE_PROVIDER_SITE_OTHER): Payer: Medicare Other | Admitting: Physician Assistant

## 2019-06-02 ENCOUNTER — Other Ambulatory Visit: Payer: Self-pay

## 2019-06-02 VITALS — BP 117/68 | HR 63 | Temp 98.5°F | Ht 72.0 in | Wt 165.0 lb

## 2019-06-02 DIAGNOSIS — R059 Cough, unspecified: Secondary | ICD-10-CM

## 2019-06-02 DIAGNOSIS — R05 Cough: Secondary | ICD-10-CM | POA: Diagnosis not present

## 2019-06-02 NOTE — Patient Instructions (Signed)
We will check some labs today including a covid test as well as a chest X ray. If your covid test is positive, we will call you to set you up with the monoclonal antibody infusion. Thankfully, you should have good protection from your vaccines. We will call you with your lab results and call in medications if CXR comes back with an infection. Otherwise, please continue supportive care and see [place] back as needed.

## 2019-06-02 NOTE — Progress Notes (Signed)
[place]                                       Acute visit note   Date:  06/02/2019   ID:  [name], DOB 11-06-1940, MRN [id number]  PCP:  [name], MD   CC: chest pain and SOB  History of Present Illness:  Mark Clayton is a 79 y.o. male who presents to [facility] Respiratory clinic for evaluation of chest pain and shortness of breath. He has a hx of NICM s/p ICD, chronic systolic CHF, CAD s/p CABG, persistent afib on dofetilide maintaining sinus, HTN, HLD.   He was seen by his primary cardiologist Dr. Marlou Porch in January and doing well.  He had his second Covid vaccine 2/15. Starting last Tuesday 3/2 he has had intermittent fevers (tmax 99), shortness of breath, chest tightness and a dry cough. Chest hurts when he coughs. No exertional chest pain. Cough also productive- sputum looks thick and white. Feels more fatigued than usual. No loss of taste or smell. No dysuria or urinary frequency. No LE edema, unexplained weight gain, orthopnea or PND. No dizziness or syncope. No blood in stool or urine. No palpitations. He has no known sick exposures.   High Risk Complications for [id number]: age, HTN, CVD.  Past Medical History:  Diagnosis Date  . AICD (automatic cardioverter/defibrillator) present 01/17/2003   Medtronic Maximo 7232CX ICD, serial [id number]  . Anemia 02-06-11   takes oral iron  . Arthritis    hands, knees  . CAD (coronary artery disease) 2003   a. h/o MI and CABG in 2003. b. s/p DES to SVG-RPDA-RPLB in 08/2014.  Marland Kitchen Cancer of sigmoid colon ([place]) 2012   a. s/p colon surgery.  . Carotid bruit   . Chronic systolic CHF (congestive heart failure) (HCC)    a. EF 20% in 2014; b. 08/2017 Echo: EF 20-25%, diff HK, Gr3 DD. Triv AI. Mod MR. Sev dil LA. Mildly dil RV w/ mildly reduced RV fxn. Mildly dil RA. Mod TR. PASP 49mHg.  Marland Kitchen Cough   . GERD (gastroesophageal reflux disease) 02-06-11  . HTN (hypertension)   . Hyperlipidemia   . Ischemic cardiomyopathy    a. EF 20% in 2014. (Master study EF >20%); b. 08/2017 Echo: EF 20-25%, diff HK. Gr3 DD.  . LV (left ventricular) mural thrombus    a. 12/2012 Echo: EF 20% with mural thrombus No evidence of thrombus on 08/2017 echo.  . Macular degeneration   . Myocardial infarct ([place])    2003  . PAF (paroxysmal atrial fibrillation) (HCC)    a. CHA2DS2VASc = 5-->Xarelto/Tikosyn.    Past Surgical History:  Procedure Laterality Date  . CARDIAC CATHETERIZATION N/A 09/20/2014   Procedure: Left Heart Cath and Coronary Angiography;  Surgeon: Jettie Booze, MD; LAD 95%, D1 100%, CFX liner percent, OM 200%, OM 390%, RCA 90%, LIMA-LAD okay, SVG-OM 2-OM 3 minimal disease, SVG-RPDA-RPLB 100% between the RPDA and RPL     . CARDIAC CATHETERIZATION N/A 09/20/2014   Procedure: Coronary Stent Intervention;  Surgeon: Jettie Booze, MD; Synergy DES 4 x 24 mm reducing the stenosis to 5%   . CARDIAC DEFIBRILLATOR PLACEMENT  01/17/03   6949 lead. Medtronic. remote-no; with later revision  . CARDIOVERSION N/A 05/22/2016   Procedure: CARDIOVERSION;  Surgeon: Lelon Perla, MD;  Location: [facility] ENDOSCOPY;  Service: Cardiovascular;  Laterality: N/A;  .  CARDIOVERSION N/A 08/10/2017   Procedure: CARDIOVERSION;  Surgeon: Pixie Casino, MD;  Location: [facility] ENDOSCOPY;  Service: Cardiovascular;  Laterality: N/A;  . CATARACT EXTRACTION W/ INTRAOCULAR LENS  IMPLANT, BILATERAL Bilateral June/-July 2009   Dr. Katy Fitch  . COLON RESECTION  02/09/2011   Procedure: LAPAROSCOPIC SIGMOID COLON RESECTION;  Surgeon: Pedro Earls, MD;  Location: [facility] ORS;  Service: General;  Laterality: N/A;  Laparoscopic Assisted Sigmoid Colectomy  . COLON SURGERY    . COLONOSCOPY  08/31/2011   Procedure: COLONOSCOPY;  Surgeon: Jerene Bears, MD;  Location: [facility] ENDOSCOPY;  Service: Gastroenterology;  Laterality: N/A;  . COLONOSCOPY N/A 09/05/2012   Procedure: COLONOSCOPY;  Surgeon: Jerene Bears, MD;  Location: [facility] ENDOSCOPY;  Service: Gastroenterology;   Laterality: N/A;  . COLONOSCOPY N/A 04/18/2013   Procedure: COLONOSCOPY;  Surgeon: Jerene Bears, MD;  Location: [facility] ENDOSCOPY;  Service: Gastroenterology;  Laterality: N/A;  . COLONOSCOPY N/A 04/09/2014   Procedure: COLONOSCOPY;  Surgeon: Jerene Bears, MD;  Location: [facility] ENDOSCOPY;  Service: Gastroenterology;  Laterality: N/A;  . COLONOSCOPY WITH PROPOFOL N/A 05/03/2017   Procedure: COLONOSCOPY WITH PROPOFOL;  Surgeon: Jerene Bears, MD;  Location: [facility] ENDOSCOPY;  Service: Gastroenterology;  Laterality: N/A;  . CORONARY ANGIOPLASTY    . CORONARY ARTERY BYPASS GRAFT  01/2002   LIMA-LAD, SVG-OM 2-OM 3, SVG-RPDA-RPLB  . CORONARY STENT INTERVENTION N/A 11/22/2018   Procedure: CORONARY STENT INTERVENTION;  Surgeon: Nelva Bush, MD;  Location: [place] [facility];  Service: Cardiovascular;  Laterality: N/A;  SVG - RCA  . ICD GENERATOR CHANGE  2010   Medtronic Virtuoso II VR ICD  . ICD GENERATOR CHANGEOUT N/A 12/07/2018   Procedure: ICD GENERATOR CHANGEOUT;  Surgeon: Deboraha Sprang, MD;  Location: [place];  Service: Cardiovascular;  Laterality: N/A;  . INGUINAL HERNIA REPAIR Right 2000's X 2  . LAPAROSCOPIC RIGHT HEMI COLECTOMY N/A 11/04/2012   Procedure: LAPAROSCOPIC RIGHT HEMI COLECTOMY;  Surgeon: Pedro Earls, MD;  Location: [facility] ORS;  Service: General;  Laterality: N/A;  . LEFT HEART CATH AND CORS/GRAFTS ANGIOGRAPHY Left 11/22/2018   Procedure: LEFT HEART CATH AND CORS/GRAFTS ANGIOGRAPHY;  Surgeon: Nelva Bush, MD;  Location: [facility];  Service: Cardiovascular;  Laterality: Left;  . TONSILLECTOMY  ~ 1950    Current Medications: Outpatient Medications Prior to Visit  Medication Sig Dispense Refill  . carvedilol (COREG) 6.25 MG tablet Take 1 tablet (6.25 mg total) by mouth 2 (two) times daily with a meal. 60 tablet 6  . clopidogrel (PLAVIX) 75 MG tablet Take 1 tablet (75 mg total) by mouth daily with breakfast. 30 tablet 0  . dofetilide (TIKOSYN) 250 MCG capsule TAKE 1  CAPSULE BY MOUTH TWO TIMES DAILY 180 capsule 1  . ezetimibe (ZETIA) 10 MG tablet Take 1 tablet (10 mg total) by mouth daily. 90 tablet 3  . furosemide (LASIX) 40 MG tablet TAKE 1 TABLET BY MOUTH  DAILY 90 tablet 1  . isosorbide mononitrate (IMDUR) 30 MG 24 hr tablet TAKE 1 TABLET BY MOUTH  DAILY 90 tablet 3  . losartan (COZAAR) 50 MG tablet TAKE 1 TABLET BY MOUTH  DAILY 90 tablet 1  . meclizine (ANTIVERT) 25 MG tablet TAKE 1 TABLET BY MOUTH  DAILY AS NEEDED FOR  DIZZINESS. 90 tablet 0  . nitroGLYCERIN (NITROSTAT) 0.4 MG SL tablet DISSOLVE 1 TABLET UNDER THE TONGUE EVERY 5 MINUTES AS  NEEDED FOR CHEST PAIN. MAX  OF 3 TABLETS IN 15 MINUTES. CALL 911  IF PAIN PERSISTS. 100 tablet 3  . pantoprazole (PROTONIX) 40 MG tablet TAKE 1 TABLET BY MOUTH  DAILY 90 tablet 1  . vitamin B-12 (CYANOCOBALAMIN) 100 MCG tablet Take 100 mcg by mouth daily.     Alveda Reasons 20 MG TABS tablet TAKE 1 TABLET BY MOUTH  DAILY WITH SUPPER 90 tablet 2  . atorvastatin (LIPITOR) 80 MG tablet Take 1 tablet (80 mg total) by mouth daily. 90 tablet 3   No facility-administered medications prior to visit.     Allergies:   Latex and Tape   Social History   Socioeconomic History  . Marital status: Widowed    Spouse name: Not on file  . Number of children: 3  . Years of education: Not on file  . Highest education level: Not on file  Occupational History  . Occupation: self Physiological scientist  Tobacco Use  . Smoking status: Never Smoker  . Smokeless tobacco: Never Used  Substance and Sexual Activity  . Alcohol use: Yes    Alcohol/week: 2.0 standard drinks    Types: 2 Cans of beer per week  . Drug use: No  . Sexual activity: Not Currently  Other Topics Concern  . Not on file  Social History Narrative   Full time. Married.    Social Determinants of Health   Financial Resource Strain: Low Risk   . Difficulty of Paying Living Expenses: Not hard at all  Food Insecurity: No Food Insecurity  . Worried About Sales executive in the Last Year: Never true  . Ran Out of Food in the Last Year: Never true  Transportation Needs: No Transportation Needs  . Lack of Transportation (Medical): No  . Lack of Transportation (Non-Medical): No  Physical Activity: Sufficiently Active  . Days of Exercise per Week: 6 days  . Minutes of Exercise per Session: 40 min  Stress:   . Feeling of Stress : Not on file  Social Connections: Slightly Isolated  . Frequency of Communication with Friends and Family: More than three times a week  . Frequency of Social Gatherings with Friends and Family: More than three times a week  . Attends Religious Services: More than 4 times per year  . Active Member of Clubs or Organizations: Yes  . Attends Archivist Meetings: More than 4 times per year  . Marital Status: Widowed     Family History:  The patient's family history includes Alzheimer's disease in his mother; Heart disease in his father; Hyperlipidemia in his father and sister; Hypertension in his father and sister; Prostate cancer in his father.     ROS:   Please see the history of present illness.    ROS All other systems reviewed and are negative.   PHYSICAL EXAM:   VS:  BP 117/68   Pulse 63   Temp 98.5 F (36.9 C) (Oral)   Ht 6' (1.829 m)   Wt 165 lb (74.8 kg)   SpO2 98%   BMI 22.38 kg/m    GEN: Well nourished, well developed, in no acute distress HEENT: normal Neck: no JVD or masses Cardiac: RRR; no murmurs, rubs, or gallops,no edema  Respiratory:  clear to auscultation bilaterally, normal work of breathing GI: soft, nontender, nondistended, + BS MS: no deformity or atrophy Skin: warm and dry, no rash Neuro:  Alert and Oriented x 3, Strength and sensation are intact Psych: euthymic mood, full affect   Wt Readings from Last 3 Encounters:  06/02/19 165 lb (74.8  kg)  04/14/19 165 lb (74.8 kg)  03/28/19 169 lb 3.2 oz (76.7 kg)      Studies/Labs Reviewed:    Recent Labs: 08/26/2018: Magnesium  2.1; TSH 4.88 03/13/2019: ALT 8; BUN 13; Creatinine, Ser 1.13; Hemoglobin 12.6; Platelets 177.0; Potassium 4.1; Pro B Natriuretic peptide (BNP) 410.0; Sodium 140     ASSESSMENT & PLAN:   Cough, shortness of breath, fevers: his exam is very reassuring. Vital signs are stable. No s/s volume overload. Lung exam clear. Weight has been stable. No exertional chest pain or symptoms worrisome for ischemia. Fortunately, he has had two covid vaccines. Will check a covid test with ongoing fevers and cough.  If positive, I will refer him to the monoclonal antibody infusion clinic. Will also check basic labs and a CXR.   Symptoms tier reviewed as well as criteria for ending isolation. Preventative practices reviewed. The patient was given clear instructions to go to ER or return to medical center if symptoms do not improve, worsen or new problems develop. The patient verbalized understanding.   Medication Adjustments/Labs and Tests Ordered: Current medicines are reviewed at length with the patient today.  Concerns regarding medicines are outlined above.  Medication changes, Labs and Tests ordered today are listed in the Patient Instructions below. Patient Instructions  We will check some labs today including a covid test as well as a chest X ray. If your covid test is positive, we will call you to set you up with the monoclonal antibody infusion. Thankfully, you should have good protection from your vaccines. We will call you with your lab results and call in medications if CXR comes back with an infection. Otherwise, please continue supportive care and see [place] back as needed.     Signed, Angelena Form, PA-C  06/02/2019 7:26 PM       [facility], PRN Provider  [facility]. [place], [place] [facility] Phone: [phone] Clinic Fax: [phone] Clinic Hours: 5:30 pm -7:30 pm (Monday-Friday)

## 2019-06-03 LAB — BASIC METABOLIC PANEL
BUN/Creatinine Ratio: 11 (ref 10–24)
BUN: 12 mg/dL (ref 8–27)
CO2: 27 mmol/L (ref 20–29)
Calcium: 9 mg/dL (ref 8.6–10.2)
Chloride: 105 mmol/L (ref 96–106)
Creatinine, Ser: 1.1 mg/dL (ref 0.76–1.27)
GFR calc Af Amer: 74 mL/min/{1.73_m2} (ref >59)
GFR calc non Af Amer: 64 mL/min/{1.73_m2} (ref >59)
Glucose: 97 mg/dL (ref 65–99)
Potassium: 3.6 mmol/L (ref 3.5–5.2)
Sodium: 145 mmol/L — ABNORMAL HIGH (ref 134–144)

## 2019-06-03 LAB — CBC WITH DIFFERENTIAL/PLATELET
Basophils Absolute: 0.1 10*3/uL (ref 0.0–0.2)
Basos: 1 %
EOS (ABSOLUTE): 0.2 10*3/uL (ref 0.0–0.4)
Eos: 3 %
Hematocrit: 36.4 % — ABNORMAL LOW (ref 37.5–51.0)
Hemoglobin: 11.7 g/dL — ABNORMAL LOW (ref 13.0–17.7)
Immature Grans (Abs): 0 10*3/uL (ref 0.0–0.1)
Immature Granulocytes: 0 %
Lymphocytes Absolute: 1.6 10*3/uL (ref 0.7–3.1)
Lymphs: 26 %
MCH: 27.7 pg (ref 26.6–33.0)
MCHC: 32.1 g/dL (ref 31.5–35.7)
MCV: 86 fL (ref 79–97)
Monocytes Absolute: 0.8 10*3/uL (ref 0.1–0.9)
Monocytes: 12 %
Neutrophils Absolute: 3.6 10*3/uL (ref 1.4–7.0)
Neutrophils: 58 %
Platelets: 170 10*3/uL (ref 150–450)
RBC: 4.23 x10E6/uL (ref 4.14–5.80)
RDW: 13.8 % (ref 11.6–15.4)
WBC: 6.2 10*3/uL (ref 3.4–10.8)

## 2019-06-03 LAB — NOVEL CORONAVIRUS, NAA: SARS-CoV-2, NAA: NOT DETECTED

## 2019-06-05 ENCOUNTER — Telehealth: Payer: Self-pay | Admitting: Internal Medicine

## 2019-06-05 NOTE — Telephone Encounter (Signed)
Can't be in office for current fever but negative covid-19 at respiratory clinic so as long as no fever currently can do in office.

## 2019-06-05 NOTE — Telephone Encounter (Signed)
I can't really give advice without evaluation since cough could be coming from multiple things.

## 2019-06-05 NOTE — Telephone Encounter (Signed)
Would a virtual or an in office visit be appropriate for evaluation?

## 2019-06-05 NOTE — Telephone Encounter (Signed)
Called pt, LVM.   

## 2019-06-05 NOTE — Telephone Encounter (Signed)
   Patient calling with complaint of congestion, productive cough, low grade fever over the weekend  Patient seen on 3/5 at the Respiratory Clinic. Covid negative. Patient seeking advice. Declined virtual visit.  Please advise

## 2019-06-06 ENCOUNTER — Telehealth: Payer: Self-pay

## 2019-06-06 ENCOUNTER — Other Ambulatory Visit: Payer: Self-pay

## 2019-06-06 ENCOUNTER — Emergency Department (HOSPITAL_COMMUNITY): Payer: Medicare Other

## 2019-06-06 ENCOUNTER — Inpatient Hospital Stay (HOSPITAL_COMMUNITY)
Admission: EM | Admit: 2019-06-06 | Discharge: 2019-06-14 | DRG: 085 | Disposition: A | Discharge status: Rehabilitation Facility | Payer: Medicare Other | Attending: Internal Medicine | Admitting: Internal Medicine

## 2019-06-06 DIAGNOSIS — I639 Cerebral infarction, unspecified: Secondary | ICD-10-CM | POA: Diagnosis not present

## 2019-06-06 DIAGNOSIS — M170 Bilateral primary osteoarthritis of knee: Secondary | ICD-10-CM | POA: Diagnosis present

## 2019-06-06 DIAGNOSIS — E785 Hyperlipidemia, unspecified: Secondary | ICD-10-CM | POA: Diagnosis present

## 2019-06-06 DIAGNOSIS — I612 Nontraumatic intracerebral hemorrhage in hemisphere, unspecified: Secondary | ICD-10-CM

## 2019-06-06 DIAGNOSIS — I340 Nonrheumatic mitral (valve) insufficiency: Secondary | ICD-10-CM | POA: Diagnosis not present

## 2019-06-06 DIAGNOSIS — S06350A Traumatic hemorrhage of left cerebrum without loss of consciousness, initial encounter: Secondary | ICD-10-CM

## 2019-06-06 DIAGNOSIS — R402142 Coma scale, eyes open, spontaneous, at arrival to emergency department: Secondary | ICD-10-CM | POA: Diagnosis not present

## 2019-06-06 DIAGNOSIS — G8191 Hemiplegia, unspecified affecting right dominant side: Secondary | ICD-10-CM | POA: Diagnosis present

## 2019-06-06 DIAGNOSIS — I361 Nonrheumatic tricuspid (valve) insufficiency: Secondary | ICD-10-CM | POA: Diagnosis not present

## 2019-06-06 DIAGNOSIS — Z7902 Long term (current) use of antithrombotics/antiplatelets: Secondary | ICD-10-CM

## 2019-06-06 DIAGNOSIS — I480 Paroxysmal atrial fibrillation: Secondary | ICD-10-CM | POA: Diagnosis present

## 2019-06-06 DIAGNOSIS — I619 Nontraumatic intracerebral hemorrhage, unspecified: Secondary | ICD-10-CM | POA: Diagnosis not present

## 2019-06-06 DIAGNOSIS — M19041 Primary osteoarthritis, right hand: Secondary | ICD-10-CM | POA: Diagnosis present

## 2019-06-06 DIAGNOSIS — M19042 Primary osteoarthritis, left hand: Secondary | ICD-10-CM | POA: Diagnosis present

## 2019-06-06 DIAGNOSIS — I110 Hypertensive heart disease with heart failure: Secondary | ICD-10-CM | POA: Diagnosis not present

## 2019-06-06 DIAGNOSIS — I5023 Acute on chronic systolic (congestive) heart failure: Secondary | ICD-10-CM | POA: Diagnosis present

## 2019-06-06 DIAGNOSIS — S0101XA Laceration without foreign body of scalp, initial encounter: Secondary | ICD-10-CM | POA: Diagnosis present

## 2019-06-06 DIAGNOSIS — L740 Miliaria rubra: Secondary | ICD-10-CM | POA: Diagnosis not present

## 2019-06-06 DIAGNOSIS — Z79899 Other long term (current) drug therapy: Secondary | ICD-10-CM | POA: Diagnosis not present

## 2019-06-06 DIAGNOSIS — Z20822 Contact with and (suspected) exposure to covid-19: Secondary | ICD-10-CM | POA: Diagnosis not present

## 2019-06-06 DIAGNOSIS — S065X9A Traumatic subdural hemorrhage with loss of consciousness of unspecified duration, initial encounter: Secondary | ICD-10-CM

## 2019-06-06 DIAGNOSIS — R29810 Facial weakness: Secondary | ICD-10-CM | POA: Diagnosis present

## 2019-06-06 DIAGNOSIS — R58 Hemorrhage, not elsewhere classified: Secondary | ICD-10-CM | POA: Diagnosis not present

## 2019-06-06 DIAGNOSIS — K219 Gastro-esophageal reflux disease without esophagitis: Secondary | ICD-10-CM | POA: Diagnosis present

## 2019-06-06 DIAGNOSIS — D62 Acute posthemorrhagic anemia: Secondary | ICD-10-CM | POA: Diagnosis not present

## 2019-06-06 DIAGNOSIS — R0682 Tachypnea, not elsewhere classified: Secondary | ICD-10-CM | POA: Diagnosis not present

## 2019-06-06 DIAGNOSIS — I69320 Aphasia following cerebral infarction: Secondary | ICD-10-CM | POA: Diagnosis not present

## 2019-06-06 DIAGNOSIS — N390 Urinary tract infection, site not specified: Secondary | ICD-10-CM | POA: Diagnosis not present

## 2019-06-06 DIAGNOSIS — E860 Dehydration: Secondary | ICD-10-CM | POA: Diagnosis present

## 2019-06-06 DIAGNOSIS — I2510 Atherosclerotic heart disease of native coronary artery without angina pectoris: Secondary | ICD-10-CM | POA: Diagnosis not present

## 2019-06-06 DIAGNOSIS — R471 Dysarthria and anarthria: Secondary | ICD-10-CM | POA: Insufficient documentation

## 2019-06-06 DIAGNOSIS — I6340 Cerebral infarction due to embolism of unspecified cerebral artery: Secondary | ICD-10-CM | POA: Diagnosis not present

## 2019-06-06 DIAGNOSIS — Z85038 Personal history of other malignant neoplasm of large intestine: Secondary | ICD-10-CM | POA: Diagnosis not present

## 2019-06-06 DIAGNOSIS — I4819 Other persistent atrial fibrillation: Secondary | ICD-10-CM | POA: Diagnosis not present

## 2019-06-06 DIAGNOSIS — Z951 Presence of aortocoronary bypass graft: Secondary | ICD-10-CM

## 2019-06-06 DIAGNOSIS — H3530 Unspecified macular degeneration: Secondary | ICD-10-CM | POA: Diagnosis not present

## 2019-06-06 DIAGNOSIS — I69391 Dysphagia following cerebral infarction: Secondary | ICD-10-CM | POA: Diagnosis not present

## 2019-06-06 DIAGNOSIS — I63412 Cerebral infarction due to embolism of left middle cerebral artery: Secondary | ICD-10-CM

## 2019-06-06 DIAGNOSIS — I25810 Atherosclerosis of coronary artery bypass graft(s) without angina pectoris: Secondary | ICD-10-CM | POA: Diagnosis not present

## 2019-06-06 DIAGNOSIS — S065XAA Traumatic subdural hemorrhage with loss of consciousness status unknown, initial encounter: Secondary | ICD-10-CM

## 2019-06-06 DIAGNOSIS — R918 Other nonspecific abnormal finding of lung field: Secondary | ICD-10-CM | POA: Diagnosis not present

## 2019-06-06 DIAGNOSIS — R402362 Coma scale, best motor response, obeys commands, at arrival to emergency department: Secondary | ICD-10-CM | POA: Diagnosis present

## 2019-06-06 DIAGNOSIS — Z95810 Presence of automatic (implantable) cardiac defibrillator: Secondary | ICD-10-CM | POA: Diagnosis not present

## 2019-06-06 DIAGNOSIS — I5022 Chronic systolic (congestive) heart failure: Secondary | ICD-10-CM | POA: Diagnosis not present

## 2019-06-06 DIAGNOSIS — S06360A Traumatic hemorrhage of cerebrum, unspecified, without loss of consciousness, initial encounter: Secondary | ICD-10-CM | POA: Diagnosis not present

## 2019-06-06 DIAGNOSIS — I4891 Unspecified atrial fibrillation: Secondary | ICD-10-CM | POA: Diagnosis not present

## 2019-06-06 DIAGNOSIS — Z8249 Family history of ischemic heart disease and other diseases of the circulatory system: Secondary | ICD-10-CM

## 2019-06-06 DIAGNOSIS — R4781 Slurred speech: Secondary | ICD-10-CM | POA: Diagnosis not present

## 2019-06-06 DIAGNOSIS — I252 Old myocardial infarction: Secondary | ICD-10-CM

## 2019-06-06 DIAGNOSIS — I69351 Hemiplegia and hemiparesis following cerebral infarction affecting right dominant side: Secondary | ICD-10-CM | POA: Diagnosis not present

## 2019-06-06 DIAGNOSIS — R4701 Aphasia: Secondary | ICD-10-CM | POA: Diagnosis present

## 2019-06-06 DIAGNOSIS — I255 Ischemic cardiomyopathy: Secondary | ICD-10-CM | POA: Diagnosis not present

## 2019-06-06 DIAGNOSIS — S065X0A Traumatic subdural hemorrhage without loss of consciousness, initial encounter: Secondary | ICD-10-CM | POA: Diagnosis present

## 2019-06-06 DIAGNOSIS — Z955 Presence of coronary angioplasty implant and graft: Secondary | ICD-10-CM | POA: Diagnosis not present

## 2019-06-06 DIAGNOSIS — J984 Other disorders of lung: Secondary | ICD-10-CM | POA: Diagnosis not present

## 2019-06-06 DIAGNOSIS — E876 Hypokalemia: Secondary | ICD-10-CM | POA: Diagnosis not present

## 2019-06-06 DIAGNOSIS — Y92007 Garden or yard of unspecified non-institutional (private) residence as the place of occurrence of the external cause: Secondary | ICD-10-CM | POA: Diagnosis not present

## 2019-06-06 DIAGNOSIS — I10 Essential (primary) hypertension: Secondary | ICD-10-CM | POA: Diagnosis not present

## 2019-06-06 DIAGNOSIS — G4700 Insomnia, unspecified: Secondary | ICD-10-CM | POA: Diagnosis not present

## 2019-06-06 DIAGNOSIS — R404 Transient alteration of awareness: Secondary | ICD-10-CM | POA: Diagnosis not present

## 2019-06-06 DIAGNOSIS — H548 Legal blindness, as defined in USA: Secondary | ICD-10-CM | POA: Diagnosis present

## 2019-06-06 DIAGNOSIS — R402242 Coma scale, best verbal response, confused conversation, at arrival to emergency department: Secondary | ICD-10-CM | POA: Diagnosis not present

## 2019-06-06 DIAGNOSIS — I6200 Nontraumatic subdural hemorrhage, unspecified: Secondary | ICD-10-CM | POA: Diagnosis not present

## 2019-06-06 DIAGNOSIS — Z91048 Other nonmedicinal substance allergy status: Secondary | ICD-10-CM

## 2019-06-06 DIAGNOSIS — I6310 Cerebral infarction due to embolism of unspecified precerebral artery: Secondary | ICD-10-CM | POA: Diagnosis not present

## 2019-06-06 DIAGNOSIS — Z8349 Family history of other endocrine, nutritional and metabolic diseases: Secondary | ICD-10-CM

## 2019-06-06 DIAGNOSIS — I63411 Cerebral infarction due to embolism of right middle cerebral artery: Secondary | ICD-10-CM | POA: Diagnosis not present

## 2019-06-06 DIAGNOSIS — Z7901 Long term (current) use of anticoagulants: Secondary | ICD-10-CM | POA: Diagnosis not present

## 2019-06-06 DIAGNOSIS — I5043 Acute on chronic combined systolic (congestive) and diastolic (congestive) heart failure: Secondary | ICD-10-CM | POA: Diagnosis not present

## 2019-06-06 DIAGNOSIS — Z91040 Latex allergy status: Secondary | ICD-10-CM

## 2019-06-06 DIAGNOSIS — I6521 Occlusion and stenosis of right carotid artery: Secondary | ICD-10-CM | POA: Diagnosis not present

## 2019-06-06 DIAGNOSIS — I513 Intracardiac thrombosis, not elsewhere classified: Secondary | ICD-10-CM | POA: Diagnosis not present

## 2019-06-06 DIAGNOSIS — W1830XA Fall on same level, unspecified, initial encounter: Secondary | ICD-10-CM | POA: Diagnosis present

## 2019-06-06 DIAGNOSIS — E782 Mixed hyperlipidemia: Secondary | ICD-10-CM | POA: Diagnosis not present

## 2019-06-06 DIAGNOSIS — S066X0A Traumatic subarachnoid hemorrhage without loss of consciousness, initial encounter: Principal | ICD-10-CM | POA: Diagnosis present

## 2019-06-06 DIAGNOSIS — E871 Hypo-osmolality and hyponatremia: Secondary | ICD-10-CM | POA: Diagnosis not present

## 2019-06-06 LAB — HEPARIN LEVEL (UNFRACTIONATED): Heparin Unfractionated: <0.1 IU/mL — ABNORMAL LOW (ref 0.30–0.70)

## 2019-06-06 LAB — I-STAT CHEM 8, ED
BUN: 12 mg/dL (ref 8–23)
Calcium, Ion: 1.13 mmol/L — ABNORMAL LOW (ref 1.15–1.40)
Chloride: 106 mmol/L (ref 98–111)
Creatinine, Ser: 1.2 mg/dL (ref 0.61–1.24)
Glucose, Bld: 124 mg/dL — ABNORMAL HIGH (ref 70–99)
HCT: 36 % — ABNORMAL LOW (ref 39.0–52.0)
Hemoglobin: 12.2 g/dL — ABNORMAL LOW (ref 13.0–17.0)
Potassium: 3.5 mmol/L (ref 3.5–5.1)
Sodium: 142 mmol/L (ref 135–145)
TCO2: 26 mmol/L (ref 22–32)

## 2019-06-06 LAB — CBC
HCT: 37.6 % — ABNORMAL LOW (ref 39.0–52.0)
Hemoglobin: 12.2 g/dL — ABNORMAL LOW (ref 13.0–17.0)
MCH: 28.6 pg (ref 26.0–34.0)
MCHC: 32.4 g/dL (ref 30.0–36.0)
MCV: 88.3 fL (ref 80.0–100.0)
Platelets: 176 10*3/uL (ref 150–400)
RBC: 4.26 MIL/uL (ref 4.22–5.81)
RDW: 14.1 % (ref 11.5–15.5)
WBC: 5.9 10*3/uL (ref 4.0–10.5)
nRBC: 0 % (ref 0.0–0.2)

## 2019-06-06 LAB — COMPREHENSIVE METABOLIC PANEL
ALT: 11 U/L (ref 0–44)
AST: 17 U/L (ref 15–41)
Albumin: 3.6 g/dL (ref 3.5–5.0)
Alkaline Phosphatase: 49 U/L (ref 38–126)
Anion gap: 9 (ref 5–15)
BUN: 13 mg/dL (ref 8–23)
CO2: 25 mmol/L (ref 22–32)
Calcium: 9.1 mg/dL (ref 8.9–10.3)
Chloride: 107 mmol/L (ref 98–111)
Creatinine, Ser: 1.27 mg/dL — ABNORMAL HIGH (ref 0.61–1.24)
GFR calc Af Amer: >60 mL/min (ref >60)
GFR calc non Af Amer: 54 mL/min — ABNORMAL LOW (ref >60)
Glucose, Bld: 133 mg/dL — ABNORMAL HIGH (ref 70–99)
Potassium: 3.6 mmol/L (ref 3.5–5.1)
Sodium: 141 mmol/L (ref 135–145)
Total Bilirubin: 1.2 mg/dL (ref 0.3–1.2)
Total Protein: 6.2 g/dL — ABNORMAL LOW (ref 6.5–8.1)

## 2019-06-06 LAB — DIFFERENTIAL
Abs Immature Granulocytes: 0.01 10*3/uL (ref 0.00–0.07)
Basophils Absolute: 0 10*3/uL (ref 0.0–0.1)
Basophils Relative: 1 %
Eosinophils Absolute: 0.2 10*3/uL (ref 0.0–0.5)
Eosinophils Relative: 3 %
Immature Granulocytes: 0 %
Lymphocytes Relative: 25 %
Lymphs Abs: 1.5 10*3/uL (ref 0.7–4.0)
Monocytes Absolute: 0.7 10*3/uL (ref 0.1–1.0)
Monocytes Relative: 12 %
Neutro Abs: 3.5 10*3/uL (ref 1.7–7.7)
Neutrophils Relative %: 59 %

## 2019-06-06 LAB — APTT: aPTT: 29 seconds (ref 24–36)

## 2019-06-06 LAB — RESPIRATORY PANEL BY RT PCR (FLU A&B, COVID)
Influenza A by PCR: NEGATIVE
Influenza B by PCR: NEGATIVE
SARS Coronavirus 2 by RT PCR: NEGATIVE

## 2019-06-06 LAB — MRSA PCR SCREENING: MRSA by PCR: NEGATIVE

## 2019-06-06 LAB — PROTIME-INR
INR: 1.1 (ref 0.8–1.2)
Prothrombin Time: 14 seconds (ref 11.4–15.2)

## 2019-06-06 LAB — ETHANOL: Alcohol, Ethyl (B): <10 mg/dL (ref <10)

## 2019-06-06 LAB — GLUCOSE, CAPILLARY: Glucose-Capillary: 119 mg/dL — ABNORMAL HIGH (ref 70–99)

## 2019-06-06 MED ORDER — HYDRALAZINE HCL 20 MG/ML IJ SOLN
10.0000 mg | INTRAMUSCULAR | Status: DC | PRN
  Administered 2019-06-10: 05:00:00 10 mg via INTRAVENOUS
  Administered 2019-06-13: 20 mg via INTRAVENOUS
  Filled 2019-06-06 (×3): qty 1

## 2019-06-06 MED ORDER — SODIUM CHLORIDE 0.9 % IV SOLN: INTRAVENOUS | Status: DC

## 2019-06-06 MED ORDER — CLEVIDIPINE BUTYRATE 0.5 MG/ML IV EMUL
0.0000 mg/h | INTRAVENOUS | Status: DC
  Administered 2019-06-06: 1 mg/h via INTRAVENOUS
  Administered 2019-06-06 – 2019-06-07 (×2): 6 mg/h via INTRAVENOUS
  Administered 2019-06-07: 13 mg/h via INTRAVENOUS
  Administered 2019-06-07 (×2): 8 mg/h via INTRAVENOUS
  Administered 2019-06-07: 12 mg/h via INTRAVENOUS
  Filled 2019-06-06 (×7): qty 50

## 2019-06-06 MED ORDER — IOHEXOL 350 MG/ML SOLN
100.0000 mL | Freq: Once | INTRAVENOUS | Status: AC | PRN
  Administered 2019-06-06: 100 mL via INTRAVENOUS

## 2019-06-06 MED ORDER — CLEVIDIPINE BUTYRATE 0.5 MG/ML IV EMUL
INTRAVENOUS | Status: AC
  Filled 2019-06-06: qty 50

## 2019-06-06 MED ORDER — LABETALOL HCL 5 MG/ML IV SOLN
INTRAVENOUS | Status: AC | PRN
  Administered 2019-06-06 (×2): 10 mg via INTRAVENOUS

## 2019-06-06 MED ORDER — LABETALOL HCL 5 MG/ML IV SOLN
INTRAVENOUS | Status: AC
  Filled 2019-06-06: qty 4

## 2019-06-06 MED ORDER — PROTHROMBIN COMPLEX CONC HUMAN 500 UNITS IV KIT
50.0000 [IU]/kg | PACK | Status: DC
  Filled 2019-06-06: qty 3740

## 2019-06-06 MED ORDER — CHLORHEXIDINE GLUCONATE CLOTH 2 % EX PADS: 6.0000 | MEDICATED_PAD | Freq: Every day | CUTANEOUS | Status: DC

## 2019-06-06 MED ORDER — PROTHROMBIN COMPLEX CONC HUMAN 500 UNITS IV KIT
3748.0000 [IU] | PACK | Status: AC
  Administered 2019-06-06: 3748 [IU] via INTRAVENOUS
  Filled 2019-06-06: qty 3748
  Filled 2019-06-06: qty 544

## 2019-06-06 NOTE — ED Provider Notes (Signed)
[facility] EMERGENCY DEPARTMENT Provider Note   CSN: [id number] Arrival date & time: 06/06/19  1538  An emergency department physician performed an initial assessment on this suspected stroke patient at 60.  History Chief Complaint  Patient presents with   Code Stroke    Mark Clayton is a 79 y.o. male.  HPI Patient was at washing his truck.  He reports that he all of a sudden felt his right hand drop and he fell to the ground.  He is on Xarelto.  His daughter heard him call at about 3 PM.  She reports she had just been outside with him looking at the truck he just washed and then went into change clothes.  Within less than 10 minutes from leaving him is when this happened.  When she got outside he was on the ground, he had a laceration to the back of his head and his speech was severely slurred.  She called EMS.  Patient cannot recall at this time if he takes his Xarelto in the morning or the evening.  His daughter reports that he is very meticulous with his medications.  He aligns them all out and is compliant.  She however does not know what time he takes them.  Patient denies he has a headache.  His speech is very slurred but the content is situationally appropriate.  He denies chest pain.    Past Medical History:  Diagnosis Date   AICD (automatic cardioverter/defibrillator) present 01/17/2003   Medtronic Maximo 7232CX ICD, serial [id number]   Anemia 02-06-11   takes oral iron   Arthritis    hands, knees   CAD (coronary artery disease) 2003   a. h/o MI and CABG in 2003. b. s/p DES to SVG-RPDA-RPLB in 08/2014.   Cancer of sigmoid colon ([place]) 2012   a. s/p colon surgery.   Carotid bruit    Chronic systolic CHF (congestive heart failure) ([place])    a. EF 20% in 2014; b. 08/2017 Echo: EF 20-25%, diff HK, Gr3 DD. Triv AI. Mod MR. Sev dil LA. Mildly dil RV w/ mildly reduced RV fxn. Mildly dil RA. Mod TR. PASP 21mHg.   Cough    GERD (gastroesophageal reflux  disease) 02-06-11   HTN (hypertension)    Hyperlipidemia    Ischemic cardiomyopathy    a. EF 20% in 2014. (Master study EF >20%); b. 08/2017 Echo: EF 20-25%, diff HK. Gr3 DD.   LV (left ventricular) mural thrombus    a. 12/2012 Echo: EF 20% with mural thrombus No evidence of thrombus on 08/2017 echo.   Macular degeneration    Myocardial infarct ([place])    2003   PAF (paroxysmal atrial fibrillation) ([facility])    a. CHA2DS2VASc = 5-->Xarelto/Tikosyn.    Patient Active Problem List   Diagnosis Date Noted   Tinnitus 08/26/2018   Essential hypertension 01/21/2018   Coronary artery disease involving native heart with angina pectoris ([place]) 10/19/2017   Persistent atrial fibrillation    Routine general medical examination at a health care facility 01/10/2016   Unstable angina ([place]) 09/20/2014   Ischemic cardiomyopathy    Personal history of colon cancer    Implantable cardioverter-defibrillator (ICD) in situ 03/09/2012   Microcytic anemia 01/12/2011   Cardiac defibrillator  MDT VVI [id number]   Chronic systolic heart failure ([place]) 11/26/2008    Past Surgical History:  Procedure Laterality Date   CARDIAC CATHETERIZATION N/A 09/20/2014   Procedure: Left Heart Cath and Coronary Angiography;  Surgeon: Charlann Lange  Irish Lack, MD; LAD 95%, D1 100%, CFX liner percent, OM 200%, OM 390%, RCA 90%, LIMA-LAD okay, SVG-OM 2-OM 3 minimal disease, SVG-RPDA-RPLB 100% between the RPDA and RPL      CARDIAC CATHETERIZATION N/A 09/20/2014   Procedure: Coronary Stent Intervention;  Surgeon: Jettie Booze, MD; Synergy DES 4 x 24 mm reducing the stenosis to 5%    CARDIAC DEFIBRILLATOR PLACEMENT  01/17/03   6949 lead. Medtronic. remote-no; with later revision   CARDIOVERSION N/A 05/22/2016   Procedure: CARDIOVERSION;  Surgeon: Lelon Perla, MD;  Location: [place];  Service: Cardiovascular;  Laterality: N/A;   CARDIOVERSION N/A 08/10/2017   Procedure: CARDIOVERSION;  Surgeon: Pixie Casino, MD;  Location: [facility] ENDOSCOPY;  Service: Cardiovascular;  Laterality: N/A;   CATARACT EXTRACTION W/ INTRAOCULAR LENS  IMPLANT, BILATERAL Bilateral June/-July 2009   Dr. Katy Fitch   COLON RESECTION  02/09/2011   Procedure: LAPAROSCOPIC SIGMOID COLON RESECTION;  Surgeon: Pedro Earls, MD;  Location: [facility];  Service: General;  Laterality: N/A;  Laparoscopic Assisted Sigmoid Colectomy   COLON SURGERY     COLONOSCOPY  08/31/2011   Procedure: COLONOSCOPY;  Surgeon: Jerene Bears, MD;  Location: [facility] ENDOSCOPY;  Service: Gastroenterology;  Laterality: N/A;   COLONOSCOPY N/A 09/05/2012   Procedure: COLONOSCOPY;  Surgeon: Jerene Bears, MD;  Location: [facility] ENDOS[facility];  Service: Gastroenterology;  Laterality: N/A;   COLONOSCOPY N/A 04/18/2013   Procedure: COLONOSCOPY;  Surgeon: Jerene Bears, MD;  Location: [facility];  Service: Gastroenterology;  Laterality: N/A;   COLONOSCOPY N/A 04/09/2014   Procedure: COLONOSCOPY;  Surgeon: Jerene Bears, MD;  Location: [facility];  Service: Gastroenterology;  Laterality: N/A;   COLONOSCOPY WITH PROPOFOL N/A 05/03/2017   Procedure: COLONOSCOPY WITH PROPOFOL;  Surgeon: Jerene Bears, MD;  Location: [facility];  Service: Gastroenterology;  Laterality: N/A;   CORONARY ANGIOPLASTY     CORONARY ARTERY BYPASS GRAFT  01/2002   LIMA-LAD, SVG-OM 2-OM 3, SVG-RPDA-RPLB   CORONARY STENT INTERVENTION N/A 11/22/2018   Procedure: CORONARY STENT INTERVENTION;  Surgeon: Nelva Bush, MD;  Location: [place] CV LAB;  Service: Cardiovascular;  Laterality: N/A;  SVG - RCA   ICD GENERATOR CHANGE  2010   Medtronic Virtuoso II VR ICD   ICD GENERATOR CHANGEOUT N/A 12/07/2018   Procedure: ICD GENERATOR CHANGEOUT;  Surgeon: Deboraha Sprang, MD;  Location: [place] CV LAB;  Service: Cardiovascular;  Laterality: N/A;   INGUINAL HERNIA REPAIR Right 2000's X 2   LAPAROSCOPIC RIGHT HEMI COLECTOMY N/A 11/04/2012   Procedure: LAPAROSCOPIC RIGHT HEMI COLECTOMY;  Surgeon:  Pedro Earls, MD;  Location: [facility];  Service: General;  Laterality: N/A;   LEFT HEART CATH AND CORS/GRAFTS ANGIOGRAPHY Left 11/22/2018   Procedure: LEFT HEART CATH AND CORS/GRAFTS ANGIOGRAPHY;  Surgeon: Nelva Bush, MD;  Location: [place] CV LAB;  Service: Cardiovascular;  Laterality: Left;   TONSILLECTOMY  ~ 1950       Family History  Problem Relation Age of Onset   Hypertension Father    Hyperlipidemia Father    Heart disease Father    Prostate cancer Father    Alzheimer's disease Mother    Hypertension Sister    Hyperlipidemia Sister    Colon cancer Neg Hx    Esophageal cancer Neg Hx    Rectal cancer Neg Hx    Stomach cancer Neg Hx     Social History   Tobacco Use   Smoking status: Never Smoker   Smokeless tobacco: Never Used  Substance Use Topics   Alcohol use: Yes    Alcohol/week: 2.0 standard drinks    Types: 2 Cans of beer per week   Drug use: No    Home Medications Prior to Admission medications   Medication Sig Start Date End Date Taking? Authorizing Provider  atorvastatin (LIPITOR) 80 MG tablet Take 1 tablet (80 mg total) by mouth daily. 11/17/18 06/06/19 Yes Theora Gianotti, NP  carvedilol (COREG) 25 MG tablet Take 25 mg by mouth at bedtime.   Yes [provider]  clopidogrel (PLAVIX) 75 MG tablet Take 1 tablet (75 mg total) by mouth daily with breakfast. 11/24/18  Yes Mickle Plumb, Jacquelyn D, PA-C  ezetimibe (ZETIA) 10 MG tablet Take 1 tablet (10 mg total) by mouth daily. 04/14/19  Yes Jerline Pain, MD  furosemide (LASIX) 40 MG tablet TAKE 1 TABLET BY MOUTH  DAILY Patient taking differently: Take 40 mg by mouth daily.  12/30/18  Yes [name], MD  isosorbide mononitrate (IMDUR) 30 MG 24 hr tablet TAKE 1 TABLET BY MOUTH  DAILY Patient taking differently: Take 30 mg by mouth daily.  02/14/18  Yes Deboraha Sprang, MD  losartan (COZAAR) 50 MG tablet TAKE 1 TABLET BY MOUTH  DAILY 03/03/19  Yes [name], MD  nitroGLYCERIN (NITROSTAT) 0.4 MG SL tablet DISSOLVE 1 TABLET UNDER THE TONGUE EVERY 5 MINUTES AS  NEEDED FOR CHEST PAIN. MAX  OF 3 TABLETS IN 15 MINUTES. CALL 911 IF PAIN PERSISTS. Patient taking differently: Place 0.4 mg under the tongue every 5 (five) minutes as needed.  03/03/19  Yes Deboraha Sprang, MD  pantoprazole (PROTONIX) 40 MG tablet TAKE 1 TABLET BY MOUTH  DAILY Patient taking differently: Take 40 mg by mouth daily.  03/03/19  Yes [name], MD  pseudoephedrine-acetaminophen (TYLENOL SINUS) 30-500 MG TABS tablet Take 1 tablet by mouth every 4 (four) hours as needed.   Yes [provider]  vitamin B-12 (CYANOCOBALAMIN) 100 MCG tablet Take 100 mcg by mouth daily.    Yes [provider]  carvedilol (COREG) 6.25 MG tablet Take 1 tablet (6.25 mg total) by mouth 2 (two) times daily with a meal. Patient not taking: Reported on 06/06/2019 11/16/18   Theora Gianotti, NP  dofetilide (TIKOSYN) 250 MCG capsule TAKE 1 [place] Patient not taking: Reported on 06/06/2019 12/30/18   Sherran Needs, NP  meclizine (ANTIVERT) 25 MG tablet TAKE 1 TABLET BY MOUTH  DAILY AS NEEDED FOR  DIZZINESS. Patient not taking: Reported on 06/06/2019 04/04/18   [name], MD  XARELTO 20 MG TABS tablet TAKE 1 TABLET BY MOUTH  DAILY WITH SUPPER Patient not taking: Reported on 06/06/2019 02/09/18   End, Harrell Gave, MD    Allergies    Latex and Tape  Review of Systems   Review of Systems Level 5 caveat cannot obtain review of systems due to patient condition. Physical Exam Updated Vital Signs BP (!) 152/70    Pulse 61    Temp (!) 96.3 F (35.7 C) (Temporal)    Resp 17    SpO2 95%   Physical Exam Constitutional:      Comments: Patient is awake and alert.  His speech is very slurred.  He is not somnolent.  Respirations are nonlabored.  HENT:     Head:     Comments: There is some matted hair and blood on the posterior scalp.  No  active bleeding.  Gross hematoma.  Will need to reassess after cleaned.    Nose: Nose normal.     Mouth/Throat:     Mouth: Mucous membranes are moist.     Pharynx: Oropharynx is clear.  Eyes:     Extraocular Movements: Extraocular movements intact.     Pupils: Pupils are equal, round, and reactive to light.  Cardiovascular:     Rate and Rhythm: Normal rate.     Pulses: Normal pulses.     Comments: Occasional ectopic beats.  Monitor shows atrial fibrillation rate of 76.  Intraventricular conduction delay.  Occasional PVC. Pulmonary:     Effort: Pulmonary effort is normal.     Breath sounds: Normal breath sounds.  Abdominal:     General: There is no distension.     Palpations: Abdomen is soft.     Tenderness: There is no abdominal tenderness. There is no guarding.  Musculoskeletal:        General: No swelling or tenderness.     Cervical back: Neck supple.  Skin:    General: Skin is warm and dry.  Neurological:     Comments: Patient is awake and alert.  He has right facial droop.  Speech is severely slurred.  Although difficult to understand, content is situationally appropriate.  Can generate the history of working on his truck and suddenly perceiving that his right hand dropped and then he dropped.  Flaccid paralysis right upper extremity.  Grip strength intact left upper extremity.  Follows commands for grip strength on the left.     ED Results / Procedures / Treatments   Labs (all labs ordered are listed, but only abnormal results are displayed) Labs Reviewed  CBC - Abnormal; Notable for the following components:      Result Value   Hemoglobin 12.2 (*)    HCT 37.6 (*)    All other components within normal limits  COMPREHENSIVE METABOLIC PANEL - Abnormal; Notable for the following components:   Glucose, Bld 133 (*)    Creatinine, Ser 1.27 (*)    Total Protein 6.2 (*)    GFR calc non Af Amer 54 (*)    All other components within normal limits  HEPARIN LEVEL (UNFRACTIONATED)  - Abnormal; Notable for the following components:   Heparin Unfractionated <0.10 (*)    All other components within normal limits  GLUCOSE, CAPILLARY - Abnormal; Notable for the following components:   Glucose-Capillary 119 (*)    All other components within normal limits  I-STAT CHEM 8, ED - Abnormal; Notable for the following components:   Glucose, Bld 124 (*)    Calcium, Ion 1.13 (*)    Hemoglobin 12.2 (*)    HCT 36.0 (*)    All other components within normal limits  RESPIRATORY PANEL BY RT PCR (FLU A&B, COVID)  ETHANOL  PROTIME-INR  APTT  DIFFERENTIAL  RAPID URINE DRUG SCREEN, HOSP PERFORMED  URINALYSIS, ROUTINE W REFLEX MICROSCOPIC    EKG EKG Interpretation  Date/Time:  Tuesday June 06 2019 15:52:05 EST Ventricular Rate:  76 PR Interval:    QRS Duration: 174 QT Interval:  450 QTC Calculation: 506 R Axis:   67 Text Interpretation: Atrial fibrillation IVCD, consider atypical LBBB agree. old IVCD. no sig change Confirmed by Charlesetta Shanks [phone]) on 06/06/2019 4:36:02 PM   Radiology CT HEAD CODE STROKE WO CONTRAST  Result Date: 06/06/2019 CLINICAL DATA:  Code stroke. Fell and hit head with right-sided weakness and slurred speech. EXAM: CT HEAD WITHOUT CONTRAST TECHNIQUE: Contiguous axial images were obtained  from the base of the skull through the vertex without intravenous contrast. COMPARISON:  07/03/2013 FINDINGS: There is motion artifact through the skull base. Brain: There is a focal collection of acute extra-axial hemorrhage in the left interhemispheric fissure at the vertex measuring 3.3 x 1.9 cm which may be subdural or subarachnoid. Additional small volume subdural or subarachnoid hemorrhage is also present more inferiorly and anteriorly in the interhemispheric fissure to the left of midline. Scattered small areas of hyperattenuation along the right greater than left anterior frontal lobes may represent artifact versus a small amount of additional subarachnoid  hemorrhage. No acute infarct or midline shift is evident. There is mild cerebral atrophy. Vascular: Calcified atherosclerosis at the skull base. No hyperdense vessel. Skull: No fracture or suspicious osseous lesion. Sinuses/Orbits: Small right maxillary sinus. Clear paranasal sinuses and mastoid air cells. Bilateral cataract extraction. Other: Mild posterior scalp swelling/small hematoma. ASPECTS [facility] Stroke Program Early CT Score) Not scored due to the presence of hemorrhage. IMPRESSION: 1. Extra-axial hemorrhage in the left interhemispheric fissure as above. No significant mass effect. 2. No acute infarct identified. These results were communicated to Dr. Cheral Marker at 4:02 pm on 06/06/2019 by text page via the [facility] messaging system. Electronically Signed   By: Logan Bores M.D.   On: 06/06/2019 16:04    Procedures Procedures (including critical care time) CRITICAL CARE Performed by: Charlesetta Shanks   Total critical care time: 30 minutes  Critical care time was exclusive of separately billable procedures and treating other patients.  Critical care was necessary to treat or prevent imminent or life-threatening deterioration.  Critical care was time spent personally by me on the following activities: development of treatment plan with patient and/or surrogate as well as nursing, discussions with consultants, evaluation of patient's response to treatment, examination of patient, obtaining history from patient or surrogate, ordering and performing treatments and interventions, ordering and review of laboratory studies, ordering and review of radiographic studies, pulse oximetry and re-evaluation of patient's condition. Medications Ordered in ED Medications  clevidipine (CLEVIPREX) infusion 0.5 mg/mL (6 mg/hr Intravenous Rate/Dose Change 06/06/19 1735)  clevidipine (CLEVIPREX) 0.5 MG/ML infusion (has no administration in time range)  prothrombin complex conc human (KCENTRA) IVPB 3,748 Units (0 Units  Intravenous Stopped 06/06/19 1642)  labetalol (NORMODYNE) injection ( Intravenous Canceled Entry 06/06/19 1600)    ED Course  I have reviewed the triage vital signs and the nursing notes.  Pertinent labs & imaging results that were available during my care of the patient were reviewed by me and considered in my medical decision making (see chart for details).  Clinical Course as of Jun 05 1733  Tue Jun 06, 2019  1734 Consult: Reviewed with intensivist for admission. Consult: Reviewed with Dr. Trenton Gammon for neurosurgery consult.   [MP]    Clinical Course User Index [MP] Charlesetta Shanks, MD   MDM Rules/Calculators/A&P                      Patient seen by neurology as code stroke.  Cerebral hemorrhage identified.  Started on Kcentra as patient is anticoagulated on Xarelto.  Cleviprex started for blood pressure control.  Patient presents as outlined.  He was washing his truck and describes holding something in his hand when his right arm dropped and then he fell to the ground.  CT shows intracerebral hemorrhage.  Patient is anticoagulated on Xarelto.  Findings consistent with acute hemorrhagic stroke.  Patient's mental status is awake and alert.  He is speaking with  appropriate content but slurred speech.  Cognitive function appears to be intact.  Patient is admitted to intensive the service with consultation to neurosurgery.  Final Clinical Impression(s) / ED Diagnoses Final diagnoses:  Nontraumatic hemorrhage of left cerebral hemisphere [facility])    Rx / DC Orders ED Discharge Orders    None       Charlesetta Shanks, MD 06/07/19 0030

## 2019-06-06 NOTE — Telephone Encounter (Signed)
New message    The patient returning a call to the nurse from yesterday.  

## 2019-06-06 NOTE — Code Documentation (Addendum)
Stroke Response Nurse Documentation Code Documentation  Mark Clayton is a 79 y.o. male arriving to [place] ED via [facility] on 06/06/19 with past medical hx of pacemaker on Xarelto. Code stroke was activated by EMS. Patient from home where he was seen working in the yard and washing his truck by his daughter. She went inside and when she returned at 1500 she found pt laying in the yard. He was bleeding from the back of his head, had right sided weakness and speech difficulty.Pt does not remember how he fell. On Xarelto (rivaroxaban) daily. Stroke team at the bedside on patient arrival. Labs drawn and patient cleared for CT by Dr. Johnney Killian. Patient to CT with team. NIHSS 17, see documentation for details and code stroke times.  The following imaging was completed: CT head noting hemorrhage . Patient is not a candidate for tPA due to hemorrhage. Blood pressure parameters <140. Pharmacy brought [facility] to bedside and started by RN. Pt given 2 doses of 10mg  of Labetalol and started on Clevidipine after no response. Plan: Q1 vitals, mNIHSS, and pupils.  Bedside handoff with ED RN Judson Roch.    Reese Stockman, Rande Brunt RN, BSN, SCRN  Stroke Response RN

## 2019-06-06 NOTE — Telephone Encounter (Signed)
Pt has been informed. OV scheduled.

## 2019-06-06 NOTE — ED Notes (Signed)
Contacted Melissa (pts daughter,whom pt calls [name]) and  Updated her on plan of care. She currently is in [place] and states pt's sister is the one who was here visiting earlier and is the closest. [name] to be contacted at [phone] with updates

## 2019-06-06 NOTE — Consult Note (Addendum)
Neurology Consultation  Reason for Consult: Code stroke  Referring Physician: Dr. Johnney Killian  CC: Code stroke with right-sided weakness and dysarthria/aphasia  History is obtained from: EMS  HPI: Mark Clayton is a 79 y.o. male with history of PAF on Xarelto, myocardial infarct, hyperlipidemia, hypertension, congestive heart failure, carotid bruit, CAD along with AICD.  AICD is a Medtronic Maximo 723 CX, serial number [id number].  Patient was out doing yard work when he fell backwards on a concrete driveway and lost capability of using his right side.  At that point time EMS was called and patient was brought to [facility] as a code stroke.  On arrival it was noted that patient was dysarthric, visual fields were intact however was not able to move his right arm or right leg.  Blood pressure was 151/80 with blood glucose of 118.  Last seen normal was at 1450.  Patient was immediately brought back to obtain a CT head, which showed a traumatic bleed.  Due to patient being on Xarelto he was given Kcentra.  ED course  CT head shows-extra-axial hemorrhage in the left interhemispheric fissure at the vertex measuring 3.3 x 1.9 cm which may be subdural or subarachnoid.  LKW: 1450 on 06/06/2019 tpa given?: no, bleed Premorbid modified Rankin scale (mRS): 0 NIH stroke score 14   Past Medical History:  Diagnosis Date  . AICD (automatic cardioverter/defibrillator) present 01/17/2003   Medtronic Maximo 7232CX ICD, serial [id number]  . Anemia 02-06-11   takes oral iron  . Arthritis    hands, knees  . CAD (coronary artery disease) 2003   a. h/o MI and CABG in 2003. b. s/p DES to SVG-RPDA-RPLB in 08/2014.  Marland Kitchen Cancer of sigmoid colon ([place]) 2012   a. s/p colon surgery.  . Carotid bruit   . Chronic systolic CHF (congestive heart failure) (HCC)    a. EF 20% in 2014; b. 08/2017 Echo: EF 20-25%, diff HK, Gr3 DD. Triv AI. Mod MR. Sev dil LA. Mildly dil RV w/ mildly reduced RV fxn. Mildly dil RA. Mod  TR. PASP 30mHg.  Marland Kitchen Cough   . GERD (gastroesophageal reflux disease) 02-06-11  . HTN (hypertension)   . Hyperlipidemia   . Ischemic cardiomyopathy    a. EF 20% in 2014. (Master study EF >20%); b. 08/2017 Echo: EF 20-25%, diff HK. Gr3 DD.  . LV (left ventricular) mural thrombus    a. 12/2012 Echo: EF 20% with mural thrombus No evidence of thrombus on 08/2017 echo.  . Macular degeneration   . Myocardial infarct ([place])    2003  . PAF (paroxysmal atrial fibrillation) (HCC)    a. CHA2DS2VASc = 5-->Xarelto/Tikosyn.    Family History  Problem Relation Age of Onset  . Hypertension Father   . Hyperlipidemia Father   . Heart disease Father   . Prostate cancer Father   . Alzheimer's disease Mother   . Hypertension Sister   . Hyperlipidemia Sister   . Colon cancer Neg Hx   . Esophageal cancer Neg Hx   . Rectal cancer Neg Hx   . Stomach cancer Neg Hx    Social History:   reports that he has never smoked. He has never used smokeless tobacco. He reports current alcohol use of about 2.0 standard drinks of alcohol per week. He reports that he does not use drugs.  Home Medications No current facility-administered medications on file prior to encounter.   Current Outpatient Medications on File Prior to Encounter  Medication Sig Dispense Refill  .  atorvastatin (LIPITOR) 80 MG tablet Take 1 tablet (80 mg total) by mouth daily. 90 tablet 3  . carvedilol (COREG) 25 MG tablet Take 25 mg by mouth at bedtime.    . clopidogrel (PLAVIX) 75 MG tablet Take 1 tablet (75 mg total) by mouth daily with breakfast. 30 tablet 0  . ezetimibe (ZETIA) 10 MG tablet Take 1 tablet (10 mg total) by mouth daily. 90 tablet 3  . furosemide (LASIX) 40 MG tablet TAKE 1 TABLET BY MOUTH  DAILY (Patient taking differently: Take 40 mg by mouth daily. ) 90 tablet 1  . isosorbide mononitrate (IMDUR) 30 MG 24 hr tablet TAKE 1 TABLET BY MOUTH  DAILY (Patient taking differently: Take 30 mg by mouth daily. ) 90 tablet 3  . losartan  (COZAAR) 50 MG tablet TAKE 1 TABLET BY MOUTH  DAILY 90 tablet 1  . nitroGLYCERIN (NITROSTAT) 0.4 MG SL tablet DISSOLVE 1 TABLET UNDER THE TONGUE EVERY 5 MINUTES AS  NEEDED FOR CHEST PAIN. MAX  OF 3 TABLETS IN 15 MINUTES. CALL 911 IF PAIN PERSISTS. (Patient taking differently: Place 0.4 mg under the tongue every 5 (five) minutes as needed. ) 100 tablet 3  . pantoprazole (PROTONIX) 40 MG tablet TAKE 1 TABLET BY MOUTH  DAILY (Patient taking differently: Take 40 mg by mouth daily. ) 90 tablet 1  . pseudoephedrine-acetaminophen (TYLENOL SINUS) 30-500 MG TABS tablet Take 1 tablet by mouth every 4 (four) hours as needed.    . vitamin B-12 (CYANOCOBALAMIN) 100 MCG tablet Take 100 mcg by mouth daily.     . carvedilol (COREG) 6.25 MG tablet Take 1 tablet (6.25 mg total) by mouth 2 (two) times daily with a meal. (Patient not taking: Reported on 06/06/2019) 60 tablet 6  . dofetilide (TIKOSYN) 250 MCG capsule TAKE 1 CAPSULE BY MOUTH TWO TIMES DAILY (Patient not taking: Reported on 06/06/2019) 180 capsule 1  . meclizine (ANTIVERT) 25 MG tablet TAKE 1 TABLET BY MOUTH  DAILY AS NEEDED FOR  DIZZINESS. (Patient not taking: Reported on 06/06/2019) 90 tablet 0  . XARELTO 20 MG TABS tablet TAKE 1 TABLET BY MOUTH  DAILY WITH SUPPER (Patient not taking: Reported on 06/06/2019) 90 tablet 2     Hospital Medications at time of ED assessments:  Current Facility-Administered Medications:  .  labetalol (NORMODYNE) injection, , Intravenous, PRN, Kerney Elbe, MD, 10 mg at 06/06/19 1558 .  prothrombin complex conc human (KCENTRA) IVPB 3,748 Units, 3,748 Units, Intravenous, STAT, Kerney Elbe, MD  Current Outpatient Medications:  .  atorvastatin (LIPITOR) 80 MG tablet, Take 1 tablet (80 mg total) by mouth daily., Disp: 90 tablet, Rfl: 3 .  carvedilol (COREG) 6.25 MG tablet, Take 1 tablet (6.25 mg total) by mouth 2 (two) times daily with a meal., Disp: 60 tablet, Rfl: 6 .  clopidogrel (PLAVIX) 75 MG tablet, Take 1 tablet (75 mg  total) by mouth daily with breakfast., Disp: 30 tablet, Rfl: 0 .  dofetilide (TIKOSYN) 250 MCG capsule, TAKE 1 CAPSULE BY MOUTH TWO TIMES DAILY, Disp: 180 capsule, Rfl: 1 .  ezetimibe (ZETIA) 10 MG tablet, Take 1 tablet (10 mg total) by mouth daily., Disp: 90 tablet, Rfl: 3 .  furosemide (LASIX) 40 MG tablet, TAKE 1 TABLET BY MOUTH  DAILY, Disp: 90 tablet, Rfl: 1 .  isosorbide mononitrate (IMDUR) 30 MG 24 hr tablet, TAKE 1 TABLET BY MOUTH  DAILY, Disp: 90 tablet, Rfl: 3 .  losartan (COZAAR) 50 MG tablet, TAKE 1 TABLET BY MOUTH  DAILY, Disp:  90 tablet, Rfl: 1 .  meclizine (ANTIVERT) 25 MG tablet, TAKE 1 TABLET BY MOUTH  DAILY AS NEEDED FOR  DIZZINESS., Disp: 90 tablet, Rfl: 0 .  nitroGLYCERIN (NITROSTAT) 0.4 MG SL tablet, DISSOLVE 1 TABLET UNDER THE TONGUE EVERY 5 MINUTES AS  NEEDED FOR CHEST PAIN. MAX  OF 3 TABLETS IN 15 MINUTES. CALL 911 IF PAIN PERSISTS., Disp: 100 tablet, Rfl: 3 .  pantoprazole (PROTONIX) 40 MG tablet, TAKE 1 TABLET BY MOUTH  DAILY, Disp: 90 tablet, Rfl: 1 .  vitamin B-12 (CYANOCOBALAMIN) 100 MCG tablet, Take 100 mcg by mouth daily. , Disp: , Rfl:  .  XARELTO 20 MG TABS tablet, TAKE 1 TABLET BY MOUTH  DAILY WITH SUPPER, Disp: 90 tablet, Rfl: 2  ROS:    General ROS: negative for - chills, fatigue, fever, night sweats, weight gain or weight loss Psychological ROS: negative for - behavioral disorder, hallucinations, memory difficulties, mood swings or suicidal ideation Ophthalmic ROS: negative for - blurry vision, double vision, eye pain or loss of vision ENT ROS: negative for - epistaxis, nasal discharge, oral lesions, sore throat, tinnitus or vertigo Respiratory ROS: negative for - cough, hemoptysis, shortness of breath or wheezing Cardiovascular ROS: negative for - chest pain, dyspnea on exertion, edema or irregular heartbeat Gastrointestinal ROS: negative for - abdominal pain, diarrhea, hematemesis, nausea/vomiting or stool incontinence Genito-Urinary ROS: negative for -  dysuria, hematuria, incontinence or urinary frequency/urgency Musculoskeletal ROS: negative for - joint swelling or muscular weakness Neurological ROS: as noted in HPI Dermatological ROS: negative for rash and skin lesion changes  Exam: Current vital signs: BP (!) 139/95   Pulse 72   Resp 13   SpO2 95%  Vital signs in last 24 hours: Pulse Rate:  [70-72] 72 (03/09 1600) Resp:  [13-15] 13 (03/09 1600) BP: (139-145)/(80-95) 139/95 (03/09 1600) SpO2:  [95 %-98 %] 95 % (03/09 1600)   Constitutional: Appears well-developed and well-nourished.  Psych: Affect appropriate to situation Eyes: No scleral injection HENT: No OP obstrucion Head: Normocephalic.  Cardiovascular: Irregular irregular Respiratory: Effort normal, non-labored breathing GI: Soft.  No distension. There is no tenderness.  Skin: WDI, with hematoma in the posterior aspect of his skull  Neuro: Mental Status: Patient is awake, alert, oriented to person, able to follow simple commands Speech-difficulty naming, able to repeat, difficulty with comprehension as he does show some mixture of both receptive and expressive aphasia.  Patient also shows dysarthria Cranial Nerves: II: Visual Fields are full. PERRL.  III,IV, VI: EOMI without ptosis or diplopia.  V: Decreased sensation on the right VII: Right facial droop VIII: hearing is intact to voice X: Palate elevates symmetrically XI: Shoulder shrug is symmetric. XII: tongue is midline without atrophy or fasciculations.  Motor: 0/5 for the right arm and leg.  5/5 for the left arm and leg. Sensory: Decreased temp and FT sensation to the right arm and leg  deep Tendon Reflexes: 1+ bilaterally in the brachioradialis and bicep.  4+ right knee jerk (crossed adductor response) and 1+ left knee jerk.  No ankle jerks noted.   Plantars: Downgoing bilaterally  Labs I have reviewed labs in epic and the results pertinent to this consultation are:   CBC    Component Value  Date/Time   WBC 5.9 06/06/2019 1549   RBC 4.26 06/06/2019 1549   HGB 12.2 (L) 06/06/2019 1551   HGB 11.7 (L) 06/02/2019 1917   HCT 36.0 (L) 06/06/2019 1551   HCT 36.4 (L) 06/02/2019 1917   PLT 176  06/06/2019 1549   PLT 170 06/02/2019 1917   MCV 88.3 06/06/2019 1549   MCV 86 06/02/2019 1917   MCH 28.6 06/06/2019 1549   MCHC 32.4 06/06/2019 1549   RDW 14.1 06/06/2019 1549   RDW 13.8 06/02/2019 1917   LYMPHSABS 1.5 06/06/2019 1549   LYMPHSABS 1.6 06/02/2019 1917   MONOABS 0.7 06/06/2019 1549   EOSABS 0.2 06/06/2019 1549   EOSABS 0.2 06/02/2019 1917   BASOSABS 0.0 06/06/2019 1549   BASOSABS 0.1 06/02/2019 1917    CMP     Component Value Date/Time   NA 142 06/06/2019 1551   NA 145 (H) 06/02/2019 1917   K 3.5 06/06/2019 1551   CL 106 06/06/2019 1551   CO2 27 06/02/2019 1917   GLUCOSE 124 (H) 06/06/2019 1551   BUN 12 06/06/2019 1551   BUN 12 06/02/2019 1917   CREATININE 1.20 06/06/2019 1551   CALCIUM 9.0 06/02/2019 1917   PROT 7.0 03/13/2019 1028   PROT 6.9 11/16/2018 0900   ALBUMIN 4.1 03/13/2019 1028   ALBUMIN 4.3 11/16/2018 0900   AST 14 03/13/2019 1028   ALT 8 03/13/2019 1028   ALKPHOS 52 03/13/2019 1028   BILITOT 1.0 03/13/2019 1028   BILITOT 0.8 11/16/2018 0900   GFRNONAA 64 06/02/2019 1917   GFRAA 74 06/02/2019 1917    Lipid Panel     Component Value Date/Time   CHOL 182 11/16/2018 0900   TRIG 120 11/16/2018 0900   HDL 56 11/16/2018 0900   CHOLHDL 3.3 11/16/2018 0900   CHOLHDL 3 01/17/2018 0834   VLDL 18.8 01/17/2018 0834   LDLCALC 102 (H) 11/16/2018 0900   LDLDIRECT 104 (H) 03/28/2019 1018     Imaging I have reviewed the images obtained:  CT-scan of the brain-extra-axial hemorrhage in the left interhemispheric fissure at the vertex measuring 3.3 x 1.9 cm which may be subdural or subarachnoid.   Etta Quill PA-C [facility] [phone] 06/06/2019, 4:05 PM    Assessment: 79 year old male with past medical history of atrial  fibrillation, on Xarelto, who presents with acute onset of right sided weakness.  As noted above patient was doing yard work when he fell backwards.  CT does reveal that he has a subdural or subarachnoid bleed in the left interhemispheric fissure at the vertex measuring 3.3 x 1.9 cm.   1. Exam is not completely explained by the extraaxial hemorrhage seen on CT head. A concurrent acute left MCA stroke is on the DDx.  2. Has an AICD. May be MRI compatible depending on model/serial number.   Recommendations: -Obtaining a STAT CTA of head and neck with CTP to further assess the discrepant CT head and exam findings described in the Assessment above.  --MRI of the brain without contrast if pacemaker is MRI compatible -Carotid duplex secondary to patient having history of carotid bruit -Transthoracic Echo  -Hold antiplatelets for now, hold Xarelto for now  -Continue Atorvastatin  -BP goal: Systolic less than [id number] -HBAIC and Lipid profile -Telemetry monitoring -Frequent neuro checks -NPO until passes stroke swallow screen -PT/OT -- Please page stroke NP  Or  PA  Or MD from 8am -4 pm  as this patient from this time will be  followed by the stroke.   You can look them up on [email or web address]  Password [email or web address]  I have seen and examined the patient. I have formulated the assessment and recommendations. My exam findings were observed and documented by Etta Quill, PA Electronically signed: Dr. Kerney Elbe  Addendum:  CTA  shows distal left M2 vs M3 occlusion, which appears inaccessible to catheter-based thrombectomy. CTP shows 9 mL core with 81 mL of surrounding penumbra. Suspect that a patient had an acute stroke precipitating the fall, with subsequent development of the hematoma seen on CT.   Electronically signed: Dr. Kerney Elbe

## 2019-06-06 NOTE — Consult Note (Signed)
Reason for Consult: Intracerebral hemorrhage Referring Physician: Emergency department  Mark Clayton is an 79 y.o. male.  HPI: 79 year old male with acute onset right-sided weakness and speech difficulty.  Work-up demonstrates evidence of a spontaneous left parasagittal lobar hemorrhage.  Situation complicated by anticoagulation.  Patient given rapid reversal.  Admitted for observation.  Past Medical History:  Diagnosis Date  . AICD (automatic cardioverter/defibrillator) present 01/17/2003   Medtronic Maximo 7232CX ICD, serial [id number]  . Anemia 02-06-11   takes oral iron  . Arthritis    hands, knees  . CAD (coronary artery disease) 2003   a. h/o MI and CABG in 2003. b. s/p DES to SVG-RPDA-RPLB in 08/2014.  Marland Kitchen Cancer of sigmoid colon ([place]) 2012   a. s/p colon surgery.  . Carotid bruit   . Chronic systolic CHF (congestive heart failure) (HCC)    a. EF 20% in 2014; b. 08/2017 Echo: EF 20-25%, diff HK, Gr3 DD. Triv AI. Mod MR. Sev dil LA. Mildly dil RV w/ mildly reduced RV fxn. Mildly dil RA. Mod TR. PASP 64mHg.  Marland Kitchen Cough   . GERD (gastroesophageal reflux disease) 02-06-11  . HTN (hypertension)   . Hyperlipidemia   . Ischemic cardiomyopathy    a. EF 20% in 2014. (Master study EF >20%); b. 08/2017 Echo: EF 20-25%, diff HK. Gr3 DD.  . LV (left ventricular) mural thrombus    a. 12/2012 Echo: EF 20% with mural thrombus No evidence of thrombus on 08/2017 echo.  . Macular degeneration   . Myocardial infarct ([place])    2003  . PAF (paroxysmal atrial fibrillation) (HCC)    a. CHA2DS2VASc = 5-->Xarelto/Tikosyn.    Past Surgical History:  Procedure Laterality Date  . CARDIAC CATHETERIZATION N/A 09/20/2014   Procedure: Left Heart Cath and Coronary Angiography;  Surgeon: Jettie Booze, MD; LAD 95%, D1 100%, CFX liner percent, OM 200%, OM 390%, RCA 90%, LIMA-LAD okay, SVG-OM 2-OM 3 minimal disease, SVG-RPDA-RPLB 100% between the RPDA and RPL     . CARDIAC CATHETERIZATION N/A 09/20/2014    Procedure: Coronary Stent Intervention;  Surgeon: Jettie Booze, MD; Synergy DES 4 x 24 mm reducing the stenosis to 5%   . CARDIAC DEFIBRILLATOR PLACEMENT  01/17/03   6949 lead. Medtronic. remote-no; with later revision  . CARDIOVERSION N/A 05/22/2016   Procedure: CARDIOVERSION;  Surgeon: Lelon Perla, MD;  Location: [facility] ENDOSCOPY;  Service: Cardiovascular;  Laterality: N/A;  . CARDIOVERSION N/A 08/10/2017   Procedure: CARDIOVERSION;  Surgeon: Pixie Casino, MD;  Location: [facility] ENDOSCOPY;  Service: Cardiovascular;  Laterality: N/A;  . CATARACT EXTRACTION W/ INTRAOCULAR LENS  IMPLANT, BILATERAL Bilateral June/-July 2009   Dr. Katy Fitch  . COLON RESECTION  02/09/2011   Procedure: LAPAROSCOPIC SIGMOID COLON RESECTION;  Surgeon: Pedro Earls, MD;  Location: [facility] ORS;  Service: General;  Laterality: N/A;  Laparoscopic Assisted Sigmoid Colectomy  . COLON SURGERY    . COLONOSCOPY  08/31/2011   Procedure: COLONOSCOPY;  Surgeon: Jerene Bears, MD;  Location: [facility] ENDOSCOPY;  Service: Gastroenterology;  Laterality: N/A;  . COLONOSCOPY N/A 09/05/2012   Procedure: COLONOSCOPY;  Surgeon: Jerene Bears, MD;  Location: [facility] ENDOSCOPY;  Service: Gastroenterology;  Laterality: N/A;  . COLONOSCOPY N/A 04/18/2013   Procedure: COLONOSCOPY;  Surgeon: Jerene Bears, MD;  Location: [facility] ENDOSCOPY;  Service: Gastroenterology;  Laterality: N/A;  . COLONOSCOPY N/A 04/09/2014   Procedure: COLONOSCOPY;  Surgeon: Jerene Bears, MD;  Location: [facility] ENDOSCOPY;  Service: Gastroenterology;  Laterality: N/A;  . COLONOSCOPY  WITH PROPOFOL N/A 05/03/2017   Procedure: COLONOSCOPY WITH PROPOFOL;  Surgeon: Jerene Bears, MD;  Location: [facility] ENDOSCOPY;  Service: Gastroenterology;  Laterality: N/A;  . CORONARY ANGIOPLASTY    . CORONARY ARTERY BYPASS GRAFT  01/2002   LIMA-LAD, SVG-OM 2-OM 3, SVG-RPDA-RPLB  . CORONARY STENT INTERVENTION N/A 11/22/2018   Procedure: CORONARY STENT INTERVENTION;  Surgeon: Nelva Bush, MD;  Location: [facility]  CV LAB;  Service: Cardiovascular;  Laterality: N/A;  SVG - RCA  . ICD GENERATOR CHANGE  2010   Medtronic Virtuoso II VR ICD  . ICD GENERATOR CHANGEOUT N/A 12/07/2018   Procedure: ICD GENERATOR CHANGEOUT;  Surgeon: Deboraha Sprang, MD;  Location: [facility] CV LAB;  Service: Cardiovascular;  Laterality: N/A;  . INGUINAL HERNIA REPAIR Right 2000's X 2  . LAPAROSCOPIC RIGHT HEMI COLECTOMY N/A 11/04/2012   Procedure: LAPAROSCOPIC RIGHT HEMI COLECTOMY;  Surgeon: Pedro Earls, MD;  Location: [facility] ORS;  Service: General;  Laterality: N/A;  . LEFT HEART CATH AND CORS/GRAFTS ANGIOGRAPHY Left 11/22/2018   Procedure: LEFT HEART CATH AND CORS/GRAFTS ANGIOGRAPHY;  Surgeon: Nelva Bush, MD;  Location: [place] CV LAB;  Service: Cardiovascular;  Laterality: Left;  . TONSILLECTOMY  ~ 1950    Family History  Problem Relation Age of Onset  . Hypertension Father   . Hyperlipidemia Father   . Heart disease Father   . Prostate cancer Father   . Alzheimer's disease Mother   . Hypertension Sister   . Hyperlipidemia Sister   . Colon cancer Neg Hx   . Esophageal cancer Neg Hx   . Rectal cancer Neg Hx   . Stomach cancer Neg Hx     Social History:  reports that he has never smoked. He has never used smokeless tobacco. He reports current alcohol use of about 2.0 standard drinks of alcohol per week. He reports that he does not use drugs.  Allergies:  Allergies  Allergen Reactions  . Latex Rash  . Tape Rash and Other (See Comments)    USE PAPER    Medications: I have reviewed the patient's current medications.  Results for orders placed or performed during the hospital encounter of 06/06/19 (from the past 48 hour(s))  Glucose, capillary     Status: Abnormal   Collection Time: 06/06/19  3:42 PM  Result Value Ref Range   Glucose-Capillary 119 (H) 70 - 99 mg/dL    Comment: Glucose reference range applies only to samples taken after fasting for at least 8 hours.  Ethanol     Status: None    Collection Time: 06/06/19  3:49 PM  Result Value Ref Range   Alcohol, Ethyl (B) <10 <10 mg/dL    Comment: (NOTE) Lowest detectable limit for serum alcohol is 10 mg/dL. For medical purposes only. Performed at [facility], [place] [address], [place], [place] [address]   Protime-INR     Status: None   Collection Time: 06/06/19  3:49 PM  Result Value Ref Range   Prothrombin Time 14.0 11.4 - 15.2 seconds   INR 1.1 0.8 - 1.2    Comment: (NOTE) INR goal varies based on device and disease states. Performed at [facility], [place] [address] [place], [place] [address]   APTT     Status: None   Collection Time: 06/06/19  3:49 PM  Result Value Ref Range   aPTT 29 24 - 36 seconds    Comment: Performed at [facility] [place],  [place] [address]  CBC     Status: Abnormal   Collection Time: 06/06/19  3:49 PM  Result Value Ref Range   WBC 5.9 4.0 - 10.5 K/uL   RBC 4.26 4.22 - 5.81 MIL/uL   Hemoglobin 12.2 (L) 13.0 - 17.0 g/dL   HCT 37.6 (L) 39.0 - 52.0 %   MCV 88.3 80.0 - 100.0 fL   MCH 28.6 26.0 - 34.0 pg   MCHC 32.4 30.0 - 36.0 g/dL   RDW 14.1 11.5 - 15.5 %   Platelets 176 150 - 400 K/uL   nRBC 0.0 0.0 - 0.2 %    Comment: Performed at [facility], [place] [address] [place], [place] [address]  Differential     Status: None   Collection Time: 06/06/19  3:49 PM  Result Value Ref Range   Neutrophils Relative % 59 %   Neutro Abs 3.5 1.7 - 7.7 K/uL   Lymphocytes Relative 25 %   Lymphs Abs 1.5 0.7 - 4.0 K/uL   Monocytes Relative 12 %   Monocytes Absolute 0.7 0.1 - 1.0 K/uL   Eosinophils Relative 3 %   Eosinophils Absolute 0.2 0.0 - 0.5 K/uL   Basophils Relative 1 %   Basophils Absolute 0.0 0.0 - 0.1 K/uL   Immature Granulocytes 0 %   Abs Immature Granulocytes 0.01 0.00 - 0.07 K/uL    Comment: Performed at [facility] [address], [place], [place] [address]  Comprehensive metabolic panel     Status: Abnormal   Collection Time: 06/06/19   3:49 PM  Result Value Ref Range   Sodium 141 135 - 145 mmol/L   Potassium 3.6 3.5 - 5.1 mmol/L   Chloride 107 98 - 111 mmol/L   CO2 25 22 - 32 mmol/L   Glucose, Bld 133 (H) 70 - 99 mg/dL    Comment: Glucose reference range applies only to samples taken after fasting for at least 8 hours.   BUN 13 8 - 23 mg/dL   Creatinine, Ser 1.27 (H) 0.61 - 1.24 mg/dL   Calcium 9.1 8.9 - 10.3 mg/dL   Total Protein 6.2 (L) 6.5 - 8.1 g/dL   Albumin 3.6 3.5 - 5.0 g/dL   AST 17 15 - 41 U/L   ALT 11 0 - 44 U/L   Alkaline Phosphatase 49 38 - 126 U/L   Total Bilirubin 1.2 0.3 - 1.2 mg/dL   GFR calc non Af Amer 54 (L) >60 mL/min   GFR calc Af Amer >60 >60 mL/min   Anion gap 9 5 - 15    Comment: Performed at [facility] [address] [place], [place] [address]  I-stat chem 8, ED     Status: Abnormal   Collection Time: 06/06/19  3:51 PM  Result Value Ref Range   Sodium 142 135 - 145 mmol/L   Potassium 3.5 3.5 - 5.1 mmol/L   Chloride 106 98 - 111 mmol/L   BUN 12 8 - 23 mg/dL   Creatinine, Ser 1.20 0.61 - 1.24 mg/dL   Glucose, Bld 124 (H) 70 - 99 mg/dL    Comment: Glucose reference range applies only to samples taken after fasting for at least 8 hours.   Calcium, Ion 1.13 (L) 1.15 - 1.40 mmol/L   TCO2 26 22 - 32 mmol/L   Hemoglobin 12.2 (L) 13.0 - 17.0 g/dL   HCT 36.0 (L) 39.0 - 52.0 %  Heparin level - if patient on rivaroxaban (XARELTO) or  apixaban Arne [place])     Status: Abnormal   Collection Time: 06/06/19  4:05 PM  Result Value Ref Range   Heparin Unfractionated <0.10 (L) 0.30 - 0.70 IU/mL    Comment: REPEATED TO VERIFY (NOTE) If heparin results are below expected values, and patient dosage has  been confirmed, suggest follow up testing of antithrombin III levels. Performed at [facility], [place] [address] [place], [place] [address]   Respiratory Panel by RT PCR (Flu A&B, Covid) - Nasopharyngeal Swab     Status: None   Collection Time: 06/06/19  4:05 PM   Specimen:  Nasopharyngeal Swab  Result Value Ref Range   SARS Coronavirus 2 by RT PCR NEGATIVE NEGATIVE    Comment: (NOTE) SARS-CoV-2 target nucleic acids are NOT DETECTED. The SARS-CoV-2 RNA is generally detectable in upper respiratoy specimens during the acute phase of infection. The lowest concentration of SARS-CoV-2 viral copies this assay can detect is 131 copies/mL. A negative result does not preclude SARS-Cov-2 infection and should not be used as the sole basis for treatment or other patient management decisions. A negative result may occur with  improper specimen collection/handling, submission of specimen other than nasopharyngeal swab, presence of viral mutation(s) within the areas targeted by this assay, and inadequate number of viral copies (<131 copies/mL). A negative result must be combined with clinical observations, patient history, and epidemiological information. The expected result is Negative. Fact Sheet for Patients:  PinkCheek.be Fact Sheet for Healthcare Providers:  GravelBags.it This test is not yet ap proved or cleared by the [place] FDA and  has been authorized for detection and/or diagnosis of SARS-CoV-2 by FDA under an Emergency Use Authorization (EUA). This EUA will remain  in effect (meaning this test can be used) for the duration of the COVID-19 declaration under Section 564(b)(1) of the Act, 21 U.S.C. section 360bbb-3(b)(1), unless the authorization is terminated or revoked sooner.    Influenza A by PCR NEGATIVE NEGATIVE   Influenza B by PCR NEGATIVE NEGATIVE    Comment: (NOTE) The Xpert Xpress SARS-CoV-2/FLU/RSV assay is intended as an aid in  the diagnosis of influenza from Nasopharyngeal swab specimens and  should not be used as a sole basis for treatment. Nasal washings and  aspirates are unacceptable for Xpert Xpress SARS-CoV-2/FLU/RSV  testing. Fact Sheet for  Patients: PinkCheek.be Fact Sheet for Healthcare Providers: GravelBags.it This test is not yet approved or cleared by the [place] FDA and  has been authorized for detection and/or diagnosis of SARS-CoV-2 by  FDA under an Emergency Use Authorization (EUA). This EUA will remain  in effect (meaning this test can be used) for the duration of the  Covid-19 declaration under Section 564(b)(1) of the Act, 21  U.S.C. section 360bbb-3(b)(1), unless the authorization is  terminated or revoked. Performed at [facility], [place] [address] [place], [place] [address]     CT Code Stroke CTA Head W/WO contrast  Result Date: 06/06/2019 CLINICAL DATA:  Stroke EXAM: CT PERFUSION BRAIN TECHNIQUE: Multiphase CT imaging of the brain was performed following IV bolus contrast injection. Subsequent parametric perfusion maps were calculated using RAPID software. CONTRAST:  158mL OMNIPAQUE IOHEXOL 350 MG/ML SOLN COMPARISON:  CTA head 06/06/2019 FINDINGS: CT Brain Perfusion Findings: CBF (<30%) Volume: 19mL Perfusion (Tmax>6.0s) volume: 14mL Mismatch Volume: 23mL ASPECTS on noncontrast CT Head: Aspect score not calculated due to intracranial hemorrhage. Infarct Core: 9 mL Infarction Location:Left parietal lobe IMPRESSION: 9 mL of acute infarction left parietal lobe with 81 mL  of surrounding penumbra. Electronically Signed   By: Franchot Gallo M.D.   On: 06/06/2019 19:10   CT Code Stroke CTA Neck W/WO contrast  Result Date: 06/06/2019 CLINICAL DATA:  Code stroke. Fell and hit head. Intracranial hemorrhage. Slurred speech and right-sided weakness. EXAM: CT ANGIOGRAPHY HEAD AND NECK CT PERFUSION BRAIN TECHNIQUE: Multidetector CT imaging of the head and neck was performed using the standard protocol during bolus administration of intravenous contrast. Multiplanar CT image reconstructions and MIPs were obtained to evaluate the vascular anatomy. Carotid stenosis  measurements (when applicable) are obtained utilizing NASCET criteria, using the distal internal carotid diameter as the denominator. Multiphase CT imaging of the brain was performed following IV bolus contrast injection. Subsequent parametric perfusion maps were calculated using RAPID software. CONTRAST:  100 mL Omnipaque 350 IV COMPARISON:  CT head approximately 2 hours prior FINDINGS: CT HEAD FINDINGS Brain: Acute extra-axial hemorrhage left posterior frontal convexity appears unchanged. This is a combination of subdural para falcine blood as well as adjacent subarachnoid hemorrhage. The largest area of hemorrhage measures approximately 25 x 15 mm. Ventricle size normal. No acute ischemic infarct. Negative for hemorrhage or mass. Vascular: Negative for hyperdense vessel. Arterial calcification diffusely Skull: Negative for skull fracture Sinuses/Orbits: Mucosal edema paranasal sinuses. Contracted right maxillary sinus. Bilateral cataract surgery.  No orbital mass. Other: None ASPECTS ([place] Stroke Program Early CT Score) Not applicable due to intracranial hemorrhage. CTA NECK FINDINGS Aortic arch: Standard branching. Imaged portion shows no evidence of aneurysm or dissection. No significant stenosis of the major arch vessel origins. Atherosclerotic disease in the aortic arch and proximal great vessels. Pacemaker noted. Right carotid system: Atherosclerotic disease right carotid bifurcation. Approximately 50% diameter stenosis proximal right internal carotid artery. Mild atherosclerotic disease right common carotid artery. Left carotid system: Atherosclerotic disease left carotid bifurcation without significant stenosis. Vertebral arteries: Both vertebral arteries patent to the basilar without significant stenosis. Skeleton: Degenerative changes cervical spine. No acute skeletal abnormality. Other neck: Negative Upper chest: Lung apices clear bilaterally.  Left-sided pacemaker. Review of the MIP images confirms  the above findings CTA HEAD FINDINGS Anterior circulation: Atherosclerotic calcification in the cavernous carotid bilaterally causing mild stenosis. Right middle cerebral artery widely patent. Both anterior cerebral arteries widely patent. Left M1 widely patent. There is a branch occlusion left M3. There is flow past the short segment occlusion with decreased perfusion in this area. Probable additional distal emboli past the occlusion. This corresponds to an area of ischemia on CT perfusion. Posterior circulation: Mild atherosclerotic stenosis of the distal vertebral artery bilaterally. Right PICA patent. Left PICA not visualized. AICA patent bilaterally. Mild atherosclerotic disease in the basilar. Superior cerebellar and posterior cerebral arteries patent bilaterally. Fetal origin right posterior cerebral artery. Venous sinuses: Patent Anatomic variants: None Review of the MIP images confirms the above findings CT Brain Perfusion Findings: ASPECTS: Not scored due to intracranial hemorrhage CBF (<30%) Volume: 53mL Perfusion (Tmax>6.0s) volume: 31mL Mismatch Volume: 64mL Infarction Location:Left parietal lobe IMPRESSION: 1. Left convexity hemorrhage unchanged from earlier today. This appears to be a combination of left parafalcine subdural hematoma and subarachnoid hemorrhage. This is related to trauma given history of fall. No acute ischemic infarct on CT. 2. 50% diameter stenosis proximal right internal carotid artery. Left carotid artery patent without significant stenosis. Both vertebral arteries patent in the neck. 3. Segmental occlusion left M3 branch with trickle distal flow and additional clot distally. 4. CT perfusion demonstrates 9 mL of core infarct and 81 mL of additional ischemia in the  left parietal lobe corresponding to the left M3 branch occlusion. 5. These results were called by telephone at the time of interpretation on 06/06/2019 at 6:44 pm to provider ERIC [facility] , who verbally acknowledged these  results. Electronically Signed   By: Franchot Gallo M.D.   On: 06/06/2019 18:45   CT Code Stroke Cerebral Perfusion with contrast  Result Date: 06/06/2019 CLINICAL DATA:  Stroke EXAM: CT PERFUSION BRAIN TECHNIQUE: Multiphase CT imaging of the brain was performed following IV bolus contrast injection. Subsequent parametric perfusion maps were calculated using RAPID software. CONTRAST:  133mL OMNIPAQUE IOHEXOL 350 MG/ML SOLN COMPARISON:  CTA head 06/06/2019 FINDINGS: CT Brain Perfusion Findings: CBF (<30%) Volume: 75mL Perfusion (Tmax>6.0s) volume: 12mL Mismatch Volume: 13mL ASPECTS on noncontrast CT Head: Aspect score not calculated due to intracranial hemorrhage. Infarct Core: 9 mL Infarction Location:Left parietal lobe IMPRESSION: 9 mL of acute infarction left parietal lobe with 81 mL of surrounding penumbra. Electronically Signed   By: Franchot Gallo M.D.   On: 06/06/2019 19:10   CT HEAD CODE STROKE WO CONTRAST  Result Date: 06/06/2019 CLINICAL DATA:  Code stroke. Fell and hit head with right-sided weakness and slurred speech. EXAM: CT HEAD WITHOUT CONTRAST TECHNIQUE: Contiguous axial images were obtained from the base of the skull through the vertex without intravenous contrast. COMPARISON:  07/03/2013 FINDINGS: There is motion artifact through the skull base. Brain: There is a focal collection of acute extra-axial hemorrhage in the left interhemispheric fissure at the vertex measuring 3.3 x 1.9 cm which may be subdural or subarachnoid. Additional small volume subdural or subarachnoid hemorrhage is also present more inferiorly and anteriorly in the interhemispheric fissure to the left of midline. Scattered small areas of hyperattenuation along the right greater than left anterior frontal lobes may represent artifact versus a small amount of additional subarachnoid hemorrhage. No acute infarct or midline shift is evident. There is mild cerebral atrophy. Vascular: Calcified atherosclerosis at the skull base.  No hyperdense vessel. Skull: No fracture or suspicious osseous lesion. Sinuses/Orbits: Small right maxillary sinus. Clear paranasal sinuses and mastoid air cells. Bilateral cataract extraction. Other: Mild posterior scalp swelling/small hematoma. ASPECTS [facility] Stroke Program Early CT Score) Not scored due to the presence of hemorrhage. IMPRESSION: 1. Extra-axial hemorrhage in the left interhemispheric fissure as above. No significant mass effect. 2. No acute infarct identified. These results were communicated to Dr. Cheral Marker at 4:02 pm on 06/06/2019 by text page via the [facility] messaging system. Electronically Signed   By: Logan Bores M.D.   On: 06/06/2019 16:04    Review of systems not obtained due to patient factors. Blood pressure 126/67, pulse 74, temperature 97.8 F (36.6 C), temperature source Oral, resp. rate 19, weight 72.5 kg, SpO2 94 %. Patient is awake and aware.  He is oriented times person and time although his speech is garbled.  Cranial nerve function with right-sided facial weakness.  Tongue protrudes to the right.  0/5 movement right upper extremity 2/5 movement right lower extremity.  5/5 movement left upper and lower extremity.  Examination head ears eyes nose throat is atraumatic without evidence of other abnormality.  Chest and abdomen benign.  Extremities free from injury deformity.  Assessment/Plan: Left parasagittal lobar hypertensive hemorrhage complicated by anticoagulation.  Continue anticoagulation reversal.  Continue ICU observation.  No indication for any type of surgical intervention at present.  Mallie Mussel A Margareth Kanner 06/06/2019, 10:13 PM

## 2019-06-06 NOTE — Progress Notes (Signed)
eLink Physician-Brief Progress Note Patient Name: Mark Clayton DOB: 14-Jan-1941 MRN: [id number]   Date of Service  06/06/2019  HPI/Events of Note  Pt admitted with traumatic SAH/ SDH following a syncopal episode, Pt with afib on Xarelto and received Kcentra in ED, Per neurology there is a concern for ischemic stroke precipitating fall. CT perfusion brain shows left parietal lobe ischemic stroke .  eICU Interventions  New Patient  Evaluation completed.        [name] 06/06/2019, 9:50 PM

## 2019-06-06 NOTE — ED Triage Notes (Signed)
Pt here from home after his wife heard him yelling in the yard, went to check on him, and he was on the ground, bleeding from a wound on his head. Pt thinks he remembers leaning over to pick something up off the ground and nothing after that. Pt flaccid on right side with slurred speech and aphasia.

## 2019-06-06 NOTE — ED Notes (Signed)
ICU  [name] daughter [phone] looking for an update on the pt

## 2019-06-06 NOTE — H&P (Addendum)
NAME:  Mark Clayton, MRN:  [id number], DOB:  [birth year], LOS: 0 ADMISSION DATE:  06/06/2019, CONSULTATION DATE:  06/06/19 REFERRING MD:  Mark Clayton  CHIEF COMPLAINT:  Right sided weakness   Brief History   Mark Clayton is a 79 y.o. male who was admitted 3/9 with traumatic ICH.  History of present illness   RYON Clayton is a 79 y.o. male who has a PMH including but not limited to PAF on xarelto, MI, LV thrombus, ICM, HTN, HLD, CAD, CA of sigmoid colon s/p surgery (see "past medical history" for rest).  He presented to [facility] ED after falling backwards while outside doing yardwork and washing his truck.  He struck his head on a concrete driveway and lost capability to move his right side.  Last seen normal at 1450.  He was brought in as code stroke.  CT head demonstrated extra axial hemorrhage on the left.  CTA head / neck and CTP pending.  MRI deferred due to AICD.  Given hx of chronic xarelto use for PAF, he was given [place].  He was evaluated by neurology who requested that he be admitted by [facility].  Past Medical History  has Chronic systolic heart failure ([place]); Cardiac defibrillator  MDT VVI; Microcytic anemia; Implantable cardioverter-defibrillator (ICD) in situ; Personal history of colon cancer; Unstable angina ([place]); Ischemic cardiomyopathy; Routine general medical examination at a health care facility; Persistent atrial fibrillation; Coronary artery disease involving native heart with angina pectoris ([place]); Essential hypertension; Tinnitus; and ICH (intracerebral hemorrhage) ([place]) on their problem list.  Significant Hospital Events   3/9 > admit.  Consults:  PCCM, neuro, neurosurgery.  Procedures:  None.  Significant Diagnostic Tests:  CT head 3/9 > extra axial hemorrhage on the left which is either subdural or subarachnoid. CTA / CTP head and neck 3/9 >   Micro Data:  COVID 3/9 > neg. Flu 3/9 > neg.  Antimicrobials:  None.   Interim history/subjective:  Dysarthric.  Vitals  stable.  Objective:  Blood pressure (!) 152/70, pulse 61, temperature (!) 96.3 F (35.7 C), temperature source Temporal, resp. rate 17, SpO2 95 %.        Intake/Output Summary (Last 24 hours) at 06/06/2019 1824 Last data filed at 06/06/2019 1642 Gross per 24 hour  Intake 200 ml  Output --  Net 200 ml   There were no vitals filed for this visit.  Examination: General: Adult male, in NAD. Neuro: Awake, nods head, able to move left arm and left leg but flaccid on right. Dysarthric. HEENT: Small laceration to posterior scalp with dried blood. Sclerae anicteric.  EOMI. Cardiovascular: RRR, no M/R/G.  Lungs: Respirations even and unlabored.  CTA bilaterally, No W/R/R.  Abdomen: BS x 4, soft, NT/ND.  Musculoskeletal: No gross deformities, no edema.  Skin: Intact, warm, no rashes.  Assessment & Plan:   Traumatic ICH / extra axial hemorrhage - s/p Kcentra dose given hx of being on xarelto. - Per neurology and neurosurgery. - Hold all anticoagulation, antiplatelets.  Hx PAF (on xarelto), MI, ICM, HTN, HLD, CAD, carotid bruit. - Hold home xarelto, plavix, coreg, atorvastatin, zetia, imdur, losartan. - Cleviprex PRN and Hydralazine PRN given inability to take PO antihypertensives. - Goal SBP < 140 per neuro.  Hx GERD. - PPI IV while can't take PO.   Best Practice:  Diet: NPO for now.  Will need swallow eval. Pain/Anxiety/Delirium protocol (if indicated): N/A. VAP protocol (if indicated): N/A. DVT prophylaxis: SCD's only. GI prophylaxis: PPI. Glucose control: N/A.  Mobility: Bedrest. Code Status: Full. Family Communication: None available currently, will try to call. Disposition: ICU.  Labs   CBC: Recent Labs  Lab 06/02/19 1917 06/06/19 1549 06/06/19 1551  WBC 6.2 5.9  --   NEUTROABS 3.6 3.5  --   HGB 11.7* 12.2* 12.2*  HCT 36.4* 37.6* 36.0*  MCV 86 88.3  --   PLT 170 176  --    Basic Metabolic Panel: Recent Labs  Lab 06/02/19 1917 06/06/19 1549 06/06/19 1551   NA 145* 141 142  K 3.6 3.6 3.5  CL 105 107 106  CO2 27 25  --   GLUCOSE 97 133* 124*  BUN 12 13 12   CREATININE 1.10 1.27* 1.20  CALCIUM 9.0 9.1  --    GFR: Estimated Creatinine Clearance: 53.7 mL/min (by C-G formula based on SCr of 1.2 mg/dL). Recent Labs  Lab 06/02/19 1917 06/06/19 1549  WBC 6.2 5.9   Liver Function Tests: Recent Labs  Lab 06/06/19 1549  AST 17  ALT 11  ALKPHOS 49  BILITOT 1.2  PROT 6.2*  ALBUMIN 3.6   No results for input(s): LIPASE, AMYLASE in the last 168 hours. No results for input(s): AMMONIA in the last 168 hours. ABG    Component Value Date/Time   TCO2 26 06/06/2019 1551    Coagulation Profile: Recent Labs  Lab 06/06/19 1549  INR 1.1   Cardiac Enzymes: No results for input(s): CKTOTAL, CKMB, CKMBINDEX, TROPONINI in the last 168 hours. HbA1C: Hgb A1c MFr Bld  Date/Time Value Ref Range Status  12/07/2016 11:16 AM 6.2 4.6 - 6.5 % Final    Comment:    Glycemic Control Guidelines for People with Diabetes:Non Diabetic:  <6%Goal of Therapy: <7%Additional Action Suggested:  >8%    CBG: Recent Labs  Lab 06/06/19 1542  GLUCAP 119*    Review of Systems:   Unable to obtain due to dysarthria.  Past medical history  He,  has a past medical history of AICD (automatic cardioverter/defibrillator) present (01/17/2003), Anemia (02-06-11), Arthritis, CAD (coronary artery disease) (2003), Cancer of sigmoid colon ([place]) (2012), Carotid bruit, Chronic systolic CHF (congestive heart failure) ([place]), Cough, GERD (gastroesophageal reflux disease) (02-06-11), HTN (hypertension), Hyperlipidemia, Ischemic cardiomyopathy, LV (left ventricular) mural thrombus, Macular degeneration, Myocardial infarct ([place]), and PAF (paroxysmal atrial fibrillation) ([place]).   Surgical History    Past Surgical History:  Procedure Laterality Date  . CARDIAC CATHETERIZATION N/A 09/20/2014   Procedure: Left Heart Cath and Coronary Angiography;  Surgeon: Jettie Booze, MD; LAD  95%, D1 100%, CFX liner percent, OM 200%, OM 390%, RCA 90%, LIMA-LAD okay, SVG-OM 2-OM 3 minimal disease, SVG-RPDA-RPLB 100% between the RPDA and RPL     . CARDIAC CATHETERIZATION N/A 09/20/2014   Procedure: Coronary Stent Intervention;  Surgeon: Jettie Booze, MD; Synergy DES 4 x 24 mm reducing the stenosis to 5%   . CARDIAC DEFIBRILLATOR PLACEMENT  01/17/03   6949 lead. Medtronic. remote-no; with later revision  . CARDIOVERSION N/A 05/22/2016   Procedure: CARDIOVERSION;  Surgeon: Lelon Perla, MD;  Location: [facility] ENDOSCOPY;  Service: Cardiovascular;  Laterality: N/A;  . CARDIOVERSION N/A 08/10/2017   Procedure: CARDIOVERSION;  Surgeon: Pixie Casino, MD;  Location: [facility] ENDOSCOPY;  Service: Cardiovascular;  Laterality: N/A;  . CATARACT EXTRACTION W/ INTRAOCULAR LENS  IMPLANT, BILATERAL Bilateral June/-July 2009   Dr. Katy Fitch  . COLON RESECTION  02/09/2011   Procedure: LAPAROSCOPIC SIGMOID COLON RESECTION;  Surgeon: Pedro Earls, MD;  Location: [facility] ORS;  Service: General;  Laterality: N/A;  Laparoscopic Assisted Sigmoid Colectomy  . COLON SURGERY    . COLONOSCOPY  08/31/2011   Procedure: COLONOSCOPY;  Surgeon: Jerene Bears, MD;  Location: [facility] ENDOSCOPY;  Service: Gastroenterology;  Laterality: N/A;  . COLONOSCOPY N/A 09/05/2012   Procedure: COLONOSCOPY;  Surgeon: Jerene Bears, MD;  [facility]: [facility] ENDOSCOPY;  Service: Gastroenterology;  Laterality: N/A;  . COLONOSCOPY N/A 04/18/2013   Procedure: COLONOSCOPY;  Surgeon: Jerene Bears, MD;  [facility]: [facility] ENDOSCOPY;  Service: Gastroenterology;  Laterality: N/A;  . COLONOSCOPY N/A 04/09/2014   Procedure: COLONOSCOPY;  Surgeon: Jerene Bears, MD;  [facility]: [facility] ENDOSCOPY;  Service: Gastroenterology;  Laterality: N/A;  . COLONOSCOPY WITH PROPOFOL N/A 05/03/2017   Procedure: COLONOSCOPY WITH PROPOFOL;  Surgeon: Jerene Bears, MD;  [facility] ENDOSCOPY;  Service: Gastroenterology;  Laterality: N/A;  . CORONARY ANGIOPLASTY    . CORONARY ARTERY BYPASS GRAFT   01/2002   LIMA-LAD, SVG-OM 2-OM 3, SVG-RPDA-RPLB  . CORONARY STENT INTERVENTION N/A 11/22/2018   Procedure: CORONARY STENT INTERVENTION;  Surgeon: Nelva Bush, MD;  Location: [place] CV LAB;  Service: Cardiovascular;  Laterality: N/A;  SVG - RCA  . ICD GENERATOR CHANGE  2010   Medtronic Virtuoso II VR ICD  . ICD GENERATOR CHANGEOUT N/A 12/07/2018   Procedure: ICD GENERATOR CHANGEOUT;  Surgeon: Deboraha Sprang, MD;  Location: [place] CV LAB;  Service: Cardiovascular;  Laterality: N/A;  . INGUINAL HERNIA REPAIR Right 2000's X 2  . LAPAROSCOPIC RIGHT HEMI COLECTOMY N/A 11/04/2012   Procedure: LAPAROSCOPIC RIGHT HEMI COLECTOMY;  Surgeon: Pedro Earls, MD;  [facility]: [facility];  Service: General;  Laterality: N/A;  . LEFT HEART CATH AND CORS/GRAFTS ANGIOGRAPHY Left 11/22/2018   Procedure: LEFT HEART CATH AND CORS/GRAFTS ANGIOGRAPHY;  Surgeon: Nelva Bush, MD;  Location: [place] CV LAB;  Service: Cardiovascular;  Laterality: Left;  . TONSILLECTOMY  ~ 68     Social History   reports that he has never smoked. He has never used smokeless tobacco. He reports current alcohol use of about 2.0 standard drinks of alcohol per week. He reports that he does not use drugs.   Family history   His family history includes Alzheimer's disease in his mother; Heart disease in his father; Hyperlipidemia in his father and sister; Hypertension in his father and sister; Prostate cancer in his father. There is no history of Colon cancer, Esophageal cancer, Rectal cancer, or Stomach cancer.   Allergies Allergies  Allergen Reactions  . Latex Rash  . Tape Rash and Other (See Comments)    USE PAPER     Home meds  Prior to Admission medications   Medication Sig Start Date End Date Taking? Authorizing Provider  atorvastatin (LIPITOR) 80 MG tablet Take 1 tablet (80 mg total) by mouth daily. 11/17/18 06/06/19 Yes Theora Gianotti, NP  carvedilol (COREG) 25 MG tablet Take 25 mg by mouth at  bedtime.   Yes [provider]  clopidogrel (PLAVIX) 75 MG tablet Take 1 tablet (75 mg total) by mouth daily with breakfast. 11/24/18  Yes Mickle Plumb, Jacquelyn D, PA-C  ezetimibe (ZETIA) 10 MG tablet Take 1 tablet (10 mg total) by mouth daily. 04/14/19  Yes Jerline Pain, MD  furosemide (LASIX) 40 MG tablet TAKE 1 TABLET BY MOUTH  DAILY Patient taking differently: Take 40 mg by mouth daily.  12/30/18  Yes [name], MD  isosorbide mononitrate (IMDUR) 30 MG 24 hr tablet TAKE 1 TABLET BY MOUTH  DAILY Patient taking  differently: Take 30 mg by mouth daily.  02/14/18  Yes Deboraha Sprang, MD  losartan (COZAAR) 50 MG tablet TAKE 1 TABLET BY MOUTH  DAILY 03/03/19  Yes [name], MD  nitroGLYCERIN (NITROSTAT) 0.4 MG SL tablet DISSOLVE 1 TABLET UNDER THE TONGUE EVERY 5 MINUTES AS  NEEDED FOR CHEST PAIN. MAX  OF 3 TABLETS IN 15 MINUTES. CALL 911 IF PAIN PERSISTS. Patient taking differently: Place 0.4 mg under the tongue every 5 (five) minutes as needed.  03/03/19  Yes Deboraha Sprang, MD  pantoprazole (PROTONIX) 40 MG tablet TAKE 1 TABLET BY MOUTH  DAILY Patient taking differently: Take 40 mg by mouth daily.  03/03/19  Yes [name], MD  pseudoephedrine-acetaminophen (TYLENOL SINUS) 30-500 MG TABS tablet Take 1 tablet by mouth every 4 (four) hours as needed.   Yes [provider]  vitamin B-12 (CYANOCOBALAMIN) 100 MCG tablet Take 100 mcg by mouth daily.    Yes [provider]  carvedilol (COREG) 6.25 MG tablet Take 1 tablet (6.25 mg total) by mouth 2 (two) times daily with a meal. Patient not taking: Reported on 06/06/2019 11/16/18   Theora Gianotti, NP  dofetilide (TIKOSYN) 250 MCG capsule TAKE 1 [place] Patient not taking: Reported on 06/06/2019 12/30/18   Sherran Needs, NP  meclizine (ANTIVERT) 25 MG tablet TAKE 1 TABLET BY MOUTH  DAILY AS NEEDED FOR  DIZZINESS. Patient not taking: Reported on 06/06/2019 04/04/18    [name], MD  XARELTO 20 MG TABS tablet TAKE 1 TABLET BY MOUTH  DAILY WITH SUPPER Patient not taking: Reported on 06/06/2019 02/09/18   End, Harrell Gave, MD    Critical care time: 35 min.    Montey Hora, [facility] 06/06/2019, 6:24 PM

## 2019-06-07 ENCOUNTER — Inpatient Hospital Stay (HOSPITAL_COMMUNITY): Payer: Medicare Other

## 2019-06-07 ENCOUNTER — Ambulatory Visit: Payer: Medicare Other | Admitting: Internal Medicine

## 2019-06-07 DIAGNOSIS — I63412 Cerebral infarction due to embolism of left middle cerebral artery: Secondary | ICD-10-CM

## 2019-06-07 DIAGNOSIS — I340 Nonrheumatic mitral (valve) insufficiency: Secondary | ICD-10-CM

## 2019-06-07 DIAGNOSIS — I361 Nonrheumatic tricuspid (valve) insufficiency: Secondary | ICD-10-CM

## 2019-06-07 LAB — BASIC METABOLIC PANEL
Anion gap: 10 (ref 5–15)
BUN: 10 mg/dL (ref 8–23)
CO2: 23 mmol/L (ref 22–32)
Calcium: 8.9 mg/dL (ref 8.9–10.3)
Chloride: 111 mmol/L (ref 98–111)
Creatinine, Ser: 1.01 mg/dL (ref 0.61–1.24)
GFR calc Af Amer: >60 mL/min (ref >60)
GFR calc non Af Amer: >60 mL/min (ref >60)
Glucose, Bld: 125 mg/dL — ABNORMAL HIGH (ref 70–99)
Potassium: 3.1 mmol/L — ABNORMAL LOW (ref 3.5–5.1)
Sodium: 144 mmol/L (ref 135–145)

## 2019-06-07 LAB — URINALYSIS, ROUTINE W REFLEX MICROSCOPIC
Bacteria, UA: NONE SEEN
Bilirubin Urine: NEGATIVE
Glucose, UA: NEGATIVE mg/dL
Ketones, ur: 5 mg/dL — AB
Leukocytes,Ua: NEGATIVE
Nitrite: NEGATIVE
Protein, ur: 30 mg/dL — AB
Specific Gravity, Urine: 1.027 (ref 1.005–1.030)
pH: 6 (ref 5.0–8.0)

## 2019-06-07 LAB — CBC
HCT: 35.4 % — ABNORMAL LOW (ref 39.0–52.0)
Hemoglobin: 11.6 g/dL — ABNORMAL LOW (ref 13.0–17.0)
MCH: 28.4 pg (ref 26.0–34.0)
MCHC: 32.8 g/dL (ref 30.0–36.0)
MCV: 86.8 fL (ref 80.0–100.0)
Platelets: 177 10*3/uL (ref 150–400)
RBC: 4.08 MIL/uL — ABNORMAL LOW (ref 4.22–5.81)
RDW: 14.3 % (ref 11.5–15.5)
WBC: 9.8 10*3/uL (ref 4.0–10.5)
nRBC: 0 % (ref 0.0–0.2)

## 2019-06-07 LAB — LIPID PANEL
Cholesterol: 121 mg/dL (ref 0–200)
HDL: 39 mg/dL — ABNORMAL LOW (ref >40)
LDL Cholesterol: 60 mg/dL (ref 0–99)
Total CHOL/HDL Ratio: 3.1 RATIO
Triglycerides: 112 mg/dL (ref <150)
VLDL: 22 mg/dL (ref 0–40)

## 2019-06-07 LAB — PHOSPHORUS: Phosphorus: 1.8 mg/dL — ABNORMAL LOW (ref 2.5–4.6)

## 2019-06-07 LAB — HEMOGLOBIN A1C
Hgb A1c MFr Bld: 6.1 % — ABNORMAL HIGH (ref 4.8–5.6)
Mean Plasma Glucose: 128.37 mg/dL

## 2019-06-07 LAB — MAGNESIUM: Magnesium: 1.9 mg/dL (ref 1.7–2.4)

## 2019-06-07 LAB — RAPID URINE DRUG SCREEN, HOSP PERFORMED
Amphetamines: NOT DETECTED
Barbiturates: NOT DETECTED
Benzodiazepines: NOT DETECTED
Cocaine: NOT DETECTED
Opiates: NOT DETECTED
Tetrahydrocannabinol: NOT DETECTED

## 2019-06-07 LAB — ECHOCARDIOGRAM COMPLETE: Weight: 2557.34 oz

## 2019-06-07 MED ORDER — EZETIMIBE 10 MG PO TABS
10.0000 mg | ORAL_TABLET | Freq: Every day | ORAL | Status: DC
  Administered 2019-06-07 – 2019-06-13 (×7): 10 mg via ORAL
  Filled 2019-06-07 (×7): qty 1

## 2019-06-07 MED ORDER — PANTOPRAZOLE SODIUM 40 MG PO TBEC
40.0000 mg | DELAYED_RELEASE_TABLET | Freq: Every day | ORAL | Status: DC
  Administered 2019-06-07 – 2019-06-14 (×8): 40 mg via ORAL
  Filled 2019-06-07 (×8): qty 1

## 2019-06-07 MED ORDER — OXYCODONE-ACETAMINOPHEN 5-325 MG PO TABS
1.0000 | ORAL_TABLET | Freq: Four times a day (QID) | ORAL | Status: DC | PRN
  Administered 2019-06-07 – 2019-06-11 (×5): 1 via ORAL
  Filled 2019-06-07 (×5): qty 1

## 2019-06-07 MED ORDER — ISOSORBIDE MONONITRATE ER 30 MG PO TB24
30.0000 mg | ORAL_TABLET | Freq: Every day | ORAL | Status: DC
  Administered 2019-06-07 – 2019-06-14 (×8): 30 mg via ORAL
  Filled 2019-06-07 (×8): qty 1

## 2019-06-07 MED ORDER — CARVEDILOL 12.5 MG PO TABS
12.5000 mg | ORAL_TABLET | Freq: Two times a day (BID) | ORAL | Status: DC
  Administered 2019-06-07 – 2019-06-14 (×14): 12.5 mg via ORAL
  Filled 2019-06-07 (×14): qty 1

## 2019-06-07 MED ORDER — CHLORHEXIDINE GLUCONATE 0.12 % MT SOLN
15.0000 mL | Freq: Two times a day (BID) | OROMUCOSAL | Status: DC
  Administered 2019-06-07 – 2019-06-14 (×15): 15 mL via OROMUCOSAL
  Filled 2019-06-07 (×14): qty 15

## 2019-06-07 MED ORDER — ORAL CARE MOUTH RINSE
15.0000 mL | Freq: Two times a day (BID) | OROMUCOSAL | Status: DC
  Administered 2019-06-07 – 2019-06-14 (×14): 15 mL via OROMUCOSAL

## 2019-06-07 MED ORDER — CHLORHEXIDINE GLUCONATE CLOTH 2 % EX PADS
6.0000 | MEDICATED_PAD | Freq: Every day | CUTANEOUS | Status: DC
  Administered 2019-06-07 – 2019-06-14 (×8): 6 via TOPICAL

## 2019-06-07 MED ORDER — SODIUM PHOSPHATES 45 MMOLE/15ML IV SOLN
10.0000 mmol | Freq: Once | INTRAVENOUS | Status: AC
  Administered 2019-06-07: 10 mmol via INTRAVENOUS
  Filled 2019-06-07: qty 3.33

## 2019-06-07 MED ORDER — VITAMIN B-12 100 MCG PO TABS
100.0000 ug | ORAL_TABLET | Freq: Every day | ORAL | Status: DC
  Administered 2019-06-07 – 2019-06-14 (×8): 100 ug via ORAL
  Filled 2019-06-07 (×8): qty 1

## 2019-06-07 MED ORDER — ACETAMINOPHEN 10 MG/ML IV SOLN
1000.0000 mg | Freq: Once | INTRAVENOUS | Status: AC
  Administered 2019-06-07: 1000 mg via INTRAVENOUS
  Filled 2019-06-07: qty 100

## 2019-06-07 MED ORDER — POTASSIUM CHLORIDE 10 MEQ/100ML IV SOLN
10.0000 meq | INTRAVENOUS | Status: DC
  Administered 2019-06-07 (×3): 10 meq via INTRAVENOUS
  Filled 2019-06-07 (×4): qty 100

## 2019-06-07 MED ORDER — PERFLUTREN LIPID MICROSPHERE
1.0000 mL | INTRAVENOUS | Status: AC | PRN
  Administered 2019-06-07: 2 mL via INTRAVENOUS
  Filled 2019-06-07: qty 10

## 2019-06-07 MED ORDER — MAGNESIUM SULFATE 2 GM/50ML IV SOLN
2.0000 g | Freq: Once | INTRAVENOUS | Status: AC
  Administered 2019-06-07: 2 g via INTRAVENOUS
  Filled 2019-06-07: qty 50

## 2019-06-07 MED ORDER — ATORVASTATIN CALCIUM 80 MG PO TABS
80.0000 mg | ORAL_TABLET | Freq: Every day | ORAL | Status: DC
  Administered 2019-06-07 – 2019-06-13 (×7): 80 mg via ORAL
  Filled 2019-06-07 (×7): qty 1

## 2019-06-07 MED ORDER — ACETAMINOPHEN 325 MG PO TABS: 650.0000 mg | ORAL_TABLET | ORAL | Status: DC | PRN

## 2019-06-07 MED ORDER — FUROSEMIDE 40 MG PO TABS
40.0000 mg | ORAL_TABLET | Freq: Every day | ORAL | Status: DC
  Administered 2019-06-07 – 2019-06-12 (×6): 40 mg via ORAL
  Filled 2019-06-07 (×3): qty 1
  Filled 2019-06-07: qty 2
  Filled 2019-06-07 (×2): qty 1

## 2019-06-07 MED ORDER — ONDANSETRON HCL 4 MG/2ML IJ SOLN
4.0000 mg | Freq: Once | INTRAMUSCULAR | Status: AC
  Administered 2019-06-07: 4 mg via INTRAVENOUS
  Filled 2019-06-07: qty 2

## 2019-06-07 NOTE — Evaluation (Signed)
Clinical/Bedside Swallow Evaluation Patient Details  Name: Mark Clayton MRN: [id number] Date of Birth: 06/23/1940  Today's Date: 06/07/2019 Time: SLP Start Time (ACUTE ONLY): 39 SLP Stop Time (ACUTE ONLY): 1040 SLP Time Calculation (min) (ACUTE ONLY): 20 min  Past Medical History:  Past Medical History:  Diagnosis Date  . AICD (automatic cardioverter/defibrillator) present 01/17/2003   Medtronic Maximo 7232CX ICD, serial [id number]  . Anemia 02-06-11   takes oral iron  . Arthritis    hands, knees  . CAD (coronary artery disease) 2003   a. h/o MI and CABG in 2003. b. s/p DES to SVG-RPDA-RPLB in 08/2014.  Marland Kitchen Cancer of sigmoid colon ([place]) 2012   a. s/p colon surgery.  . Carotid bruit   . Chronic systolic CHF (congestive heart failure) (HCC)    a. EF 20% in 2014; b. 08/2017 Echo: EF 20-25%, diff HK, Gr3 DD. Triv AI. Mod MR. Sev dil LA. Mildly dil RV w/ mildly reduced RV fxn. Mildly dil RA. Mod TR. PASP 68mHg.  Marland Kitchen Cough   . GERD (gastroesophageal reflux disease) 02-06-11  . HTN (hypertension)   . Hyperlipidemia   . Ischemic cardiomyopathy    a. EF 20% in 2014. (Master study EF >20%); b. 08/2017 Echo: EF 20-25%, diff HK. Gr3 DD.  . LV (left ventricular) mural thrombus    a. 12/2012 Echo: EF 20% with mural thrombus No evidence of thrombus on 08/2017 echo.  . Macular degeneration   . Myocardial infarct ([place])    2003  . PAF (paroxysmal atrial fibrillation) (HCC)    a. CHA2DS2VASc = 5-->Xarelto/Tikosyn.   Past Surgical History:  Past Surgical History:  Procedure Laterality Date  . CARDIAC CATHETERIZATION N/A 09/20/2014   Procedure: Left Heart Cath and Coronary Angiography;  Surgeon: Jettie Booze, MD; LAD 95%, D1 100%, CFX liner percent, OM 200%, OM 390%, RCA 90%, LIMA-LAD okay, SVG-OM 2-OM 3 minimal disease, SVG-RPDA-RPLB 100% between the RPDA and RPL     . CARDIAC CATHETERIZATION N/A 09/20/2014   Procedure: Coronary Stent Intervention;  Surgeon: Jettie Booze, MD; Synergy  DES 4 x 24 mm reducing the stenosis to 5%   . CARDIAC DEFIBRILLATOR PLACEMENT  01/17/03   6949 lead. Medtronic. remote-no; with later revision  . CARDIOVERSION N/A 05/22/2016   Procedure: CARDIOVERSION;  Surgeon: Lelon Perla, MD;  [facility];  Service: Cardiovascular;  Laterality: N/A;  . CARDIOVERSION N/A 08/10/2017   Procedure: CARDIOVERSION;  Surgeon: Pixie Casino, MD;  Location: [facility] ENDOSCOPY;  Service: Cardiovascular;  Laterality: N/A;  . CATARACT EXTRACTION W/ INTRAOCULAR LENS  IMPLANT, BILATERAL Bilateral June/-July 2009   Dr. Katy Fitch  . COLON RESECTION  02/09/2011   Procedure: LAPAROSCOPIC SIGMOID COLON RESECTION;  Surgeon: Pedro Earls, MD;  [facility];  Service: General;  Laterality: N/A;  Laparoscopic Assisted Sigmoid Colectomy  . COLON SURGERY    . COLONOSCOPY  08/31/2011   Procedure: COLONOSCOPY;  Surgeon: Jerene Bears, MD;  [facility];  Service: Gastroenterology;  Laterality: N/A;  . COLONOSCOPY N/A 09/05/2012   Procedure: COLONOSCOPY;  Surgeon: Jerene Bears, MD;  [facility];  Service: Gastroenterology;  Laterality: N/A;  . COLONOSCOPY N/A 04/18/2013   Procedure: COLONOSCOPY;  Surgeon: Jerene Bears, MD;  [facility];  Service: Gastroenterology;  Laterality: N/A;  . COLONOSCOPY N/A 04/09/2014   Procedure: COLONOSCOPY;  Surgeon: Jerene Bears, MD;  [facility];  Service: Gastroenterology;  Laterality: N/A;  . COLONOSCOPY WITH PROPOFOL N/A 05/03/2017   Procedure:  COLONOSCOPY WITH PROPOFOL;  Surgeon: Jerene Bears, MD;  Location: [facility] ENDOSCOPY;  Service: Gastroenterology;  Laterality: N/A;  . CORONARY ANGIOPLASTY    . CORONARY ARTERY BYPASS GRAFT  01/2002   LIMA-LAD, SVG-OM 2-OM 3, SVG-RPDA-RPLB  . CORONARY STENT INTERVENTION N/A 11/22/2018   Procedure: CORONARY STENT INTERVENTION;  Surgeon: Nelva Bush, MD;  Location: [place] CV LAB;  Service: Cardiovascular;  Laterality: N/A;  SVG - RCA  . ICD GENERATOR  CHANGE  2010   Medtronic Virtuoso II VR ICD  . ICD GENERATOR CHANGEOUT N/A 12/07/2018   Procedure: ICD GENERATOR CHANGEOUT;  Surgeon: Deboraha Sprang, MD;  Location: [place] CV LAB;  Service: Cardiovascular;  Laterality: N/A;  . INGUINAL HERNIA REPAIR Right 2000's X 2  . LAPAROSCOPIC RIGHT HEMI COLECTOMY N/A 11/04/2012   Procedure: LAPAROSCOPIC RIGHT HEMI COLECTOMY;  Surgeon: Pedro Earls, MD;  [facility];  Service: General;  Laterality: N/A;  . LEFT HEART CATH AND CORS/GRAFTS ANGIOGRAPHY Left 11/22/2018   Procedure: LEFT HEART CATH AND CORS/GRAFTS ANGIOGRAPHY;  Surgeon: Nelva Bush, MD;  Location: [place] CV LAB;  Service: Cardiovascular;  Laterality: Left;  . TONSILLECTOMY  ~ 1950   HPI:  Mark Clayton is a 79 y.o. male who was admitted 3/9 with traumatic ICH. PMH including but not limited to PAF on xarelto, MI, LV thrombus, ICM, HTN, HLD, CAD, CA of sigmoid colon s/p surgery (see "past medical history" for rest). CT (3/9) revealing a combination of left parafalcine subdural hematoma and subarachnoid hemorrhage. This is related to trauma given history of fall. No acute ischemic infarct on CT. Presents with Expressive > receptive apahsia. H/o GERD ad coughing.   Assessment / Plan / Recommendation Clinical Impression   Pt was alert and cooperative. Oral mech was unremarkable and his voice remained clear during the session. Pt was observed with thin liquids, NTL, puree and solids. Following trials of thin liquids and the 3oz water challenge, pt was noted with immediate/delayed coughing and throat clearing. As the pt trialed NTL, he was noted with delayed coughing/throat clearing, increasing with progression. Puree and regular texture solids were WFL. Given s/sx of aspiration, recommend proceeding with instrumental swallow study to further assess swallow function, as early as this afternoon.   SLP Visit Diagnosis: Dysphagia, unspecified (R13.10)    Aspiration Risk  Mild  aspiration risk    Diet Recommendation NPO        Other  Recommendations Oral Care Recommendations: Oral care BID   Follow up Recommendations Other (comment)(tba)      Frequency and Duration min 2x/week  2 weeks       Prognosis Prognosis for Safe Diet Advancement: Good      Swallow Study   General Date of Onset: 06/06/19 HPI: Mark Clayton is a 79 y.o. male who was admitted 3/9 with traumatic ICH. PMH including but not limited to PAF on xarelto, MI, LV thrombus, ICM, HTN, HLD, CAD, CA of sigmoid colon s/p surgery (see "past medical history" for rest). CT (3/9) revealing a combination of left parafalcine subdural hematoma and subarachnoid hemorrhage. This is related to trauma given history of fall. No acute ischemic infarct on CT. [place] with Expressive > receptive apahsia. Type of Study: Bedside Swallow Evaluation Previous Swallow Assessment: none in chart Diet Prior to this Study: NPO Temperature Spikes Noted: No Respiratory Status: Room air History of Recent Intubation: No Behavior/Cognition: Alert;Cooperative Oral Cavity Assessment: Within Functional Limits Oral Care Completed by SLP: No Oral Cavity - Dentition:  Dentures, top;Dentures, bottom Vision: Functional for self-feeding Self-Feeding Abilities: Able to feed self Patient Positioning: Upright in bed Baseline Vocal Quality: Normal Volitional Swallow: Able to elicit    Oral/Motor/Sensory Function Overall Oral Motor/Sensory Function: Within functional limits   Ice Chips Ice chips: Within functional limits   Thin Liquid Thin Liquid: Impaired Presentation: Cup Pharyngeal  Phase Impairments: Throat Clearing - Immediate;Cough - Immediate;Cough - Delayed    Nectar Thick Nectar Thick Liquid: Impaired Presentation: Cup Pharyngeal Phase Impairments: Throat Clearing - Delayed;Cough - Delayed   Honey Thick Honey Thick Liquid: Not tested   Puree Puree: Within functional limits   Solid     Solid: Within functional  limits     Aline August, Student SLP Office: [phone]  06/07/2019,11:21 AM

## 2019-06-07 NOTE — Progress Notes (Signed)
NAME:  Mark Clayton, MRN:  [id number], DOB:  01/16/1941, LOS: 1 ADMISSION DATE:  06/06/2019, CONSULTATION DATE:  06/06/19 REFERRING MD:  Cheral Marker  CHIEF COMPLAINT:  Right sided weakness   Brief History   Mark Clayton is a 79 y.o. male who was admitted 3/9 with traumatic ICH.  History of present illness   Mark Clayton is a 79 y.o. male who has a PMH including but not limited to PAF on xarelto, MI, LV thrombus, ICM, HTN, HLD, CAD, CA of sigmoid colon s/p surgery (see "past medical history" for rest).  [facility] after falling backwards while outside doing yardwork and washing his truck.  He struck his head on a concrete driveway and lost capability to move his right side.  Last seen normal at 1450.  He was brought in as code stroke.  CT head demonstrated extra axial hemorrhage on the left.  CTA head / neck and CTP pending.  MRI deferred due to AICD.  Given hx of chronic xarelto use for PAF, he was given [place].  He was evaluated by neurology who requested that he be admitted by [facility].  Past Medical History  has Chronic systolic heart failure ([place]); Cardiac defibrillator  MDT VVI; Microcytic anemia; Implantable cardioverter-defibrillator (ICD) in situ; Personal history of colon cancer; Unstable angina ([place]); Ischemic cardiomyopathy; Routine general medical examination at a health care facility; Persistent atrial fibrillation; Coronary artery disease involving native heart with angina pectoris ([place]); Essential hypertension; Tinnitus; ICH (intracerebral hemorrhage) ([place]); and Dysarthria on their problem list.  Significant Hospital Events   3/9 > admit.  Consults:  PCCM, neuro, neurosurgery.  Procedures:  None.  Significant Diagnostic Tests:  CT head 3/9 > extra axial hemorrhage on the left which is either subdural or subarachnoid. CTA / CTP head and neck 3/9 >   Micro Data:  COVID 3/9 > neg. Flu 3/9 > neg.  Antimicrobials:  None.   Interim history/subjective:  No  acute events.  Remains on Cleviprex drip.  Objective:  Blood pressure 132/63, pulse 83, temperature 98.8 F (37.1 C), temperature source Oral, resp. rate 17, weight 72.5 kg, SpO2 95 %.        Intake/Output Summary (Last 24 hours) at 06/07/2019 [id number] Last data filed at 06/07/2019 0800 Gross per 24 hour  Intake 1430.11 ml  Output 735 ml  Net 695.11 ml   Filed Weights   06/06/19 2011  Weight: 72.5 kg    Examination: Gen:      No acute distress HEENT:  EOMI, scleral hemorrhage on the right Neck:     No masses; no thyromegaly Lungs:    Clear to auscultation bilaterally; normal respiratory effort CV:         Regular rate and rhythm; no murmurs Abd:      + bowel sounds; soft, non-tender; no palpable masses, no distension Ext:    No edema; adequate peripheral perfusion Skin:      Warm and dry; no rash Neuro: Awake, dysarthric with difficulty finding words.  Weakness on the right  Assessment & Plan:  Traumatic ICH / extra axial hemorrhage - s/p Kcentra dose given hx of being on xarelto. Management per neurology and neurosurgery Discussed with care teams.  Would like to observe in ICU for 24 more hrs  Hx PAF (on xarelto), MI, ICM, HTN, HLD, CAD, carotid bruit. Hold home xarelto, plavix, coreg, atorvastatin, zetia, imdur, losartan. Off Cleviprex drip today.  Hydralazine as needed Resume home medications when cleared by  speech Goal SBP < 140 per neuro.  Hx GERD. PPI IV while can't take PO.  Best Practice:  Diet: NPO for now.  Swallow eval today Pain/Anxiety/Delirium protocol (if indicated): N/A. VAP protocol (if indicated): N/A. DVT prophylaxis: SCD's only. GI prophylaxis: PPI. Glucose control: N/A. Mobility: Bedrest. Code Status: Full. Family Communication: None available currently, will try to call. Disposition: ICU.  Marshell Garfinkel MD [name] Please see [email or web address] for pager details.  06/07/2019, 9:38 AM

## 2019-06-07 NOTE — Progress Notes (Addendum)
[facility] Progress Note Patient Name: Mark Clayton DOB: [birth year] MRN: [id number]   Date of Service  06/07/2019  HPI/Events of Note  Nausea, QT c is 506 on EKG from yesterday. Musculoskeletal pains.  eICU Interventions  Zofran 4 mg iv x 1 now, repeat 12 lead EKG.  Ofirmev  1000 mg iv x1        Ameen Mostafa U Raymie Trani 06/07/2019, 5:14 AM

## 2019-06-07 NOTE — Progress Notes (Signed)
Presented with ICH after a Fall. Was on Xarelto reversed with Kcentra. Neurosurgery recommends no surgical intervention. Neurology follows on pt for a new stroke, CT repeat vs MRI, pt has a PPM.

## 2019-06-07 NOTE — Evaluation (Signed)
Speech Language Pathology Evaluation Patient Details Name: Mark Clayton MRN: [id number] DOB: 12-Jan-1941 Today's Date: 06/07/2019 Time: [id number] SLP Time Calculation (min) (ACUTE ONLY): 18 min  Problem List:  Patient Active Problem List   Diagnosis Date Noted  . ICH (intracerebral hemorrhage) ([place]) 06/06/2019  . Dysarthria   . Tinnitus 08/26/2018  . Essential hypertension 01/21/2018  . Coronary artery disease involving native heart with angina pectoris ([place]) 10/19/2017  . Persistent atrial fibrillation   . Routine general medical examination at a health care facility 01/10/2016  . Unstable angina ([place]) 09/20/2014  . Ischemic cardiomyopathy   . Personal history of colon cancer   . Implantable cardioverter-defibrillator (ICD) in situ 03/09/2012  . Microcytic anemia 01/12/2011  . Cardiac defibrillator  MDT VVI 12/26/2010  . Chronic systolic heart failure ([place]) 11/26/2008   Past Medical History:  Past Medical History:  Diagnosis Date  . AICD (automatic cardioverter/defibrillator) present 01/17/2003   Medtronic Maximo 7232CX ICD, serial [id number]  . Anemia 02-06-11   takes oral iron  . Arthritis    hands, knees  . CAD (coronary artery disease) 2003   a. h/o MI and CABG in 2003. b. s/p DES to SVG-RPDA-RPLB in 08/2014.  Marland Kitchen Cancer of sigmoid colon ([place]) 2012   a. s/p colon surgery.  . Carotid bruit   . Chronic systolic CHF (congestive heart failure) (HCC)    a. EF 20% in 2014; b. 08/2017 Echo: EF 20-25%, diff HK, Gr3 DD. Triv AI. Mod MR. Sev dil LA. Mildly dil RV w/ mildly reduced RV fxn. Mildly dil RA. Mod TR. PASP 75mHg.  Marland Kitchen Cough   . GERD (gastroesophageal reflux disease) 02-06-11  . HTN (hypertension)   . Hyperlipidemia   . Ischemic cardiomyopathy    a. EF 20% in 2014. (Master study EF >20%); b. 08/2017 Echo: EF 20-25%, diff HK. Gr3 DD.  . LV (left ventricular) mural thrombus    a. 12/2012 Echo: EF 20% with mural thrombus No evidence of thrombus on 08/2017 echo.  . Macular  degeneration   . Myocardial infarct ([place])    2003  . PAF (paroxysmal atrial fibrillation) (HCC)    a. CHA2DS2VASc = 5-->Xarelto/Tikosyn.   Past Surgical History:  Past Surgical History:  Procedure Laterality Date  . CARDIAC CATHETERIZATION N/A 09/20/2014   Procedure: Left Heart Cath and Coronary Angiography;  Surgeon: Jettie Booze, MD; LAD 95%, D1 100%, CFX liner percent, OM 200%, OM 390%, RCA 90%, LIMA-LAD okay, SVG-OM 2-OM 3 minimal disease, SVG-RPDA-RPLB 100% between the RPDA and RPL     . CARDIAC CATHETERIZATION N/A 09/20/2014   Procedure: Coronary Stent Intervention;  Surgeon: Jettie Booze, MD; Synergy DES 4 x 24 mm reducing the stenosis to 5%   . CARDIAC DEFIBRILLATOR PLACEMENT  01/17/03   6949 lead. Medtronic. remote-no; with later revision  . CARDIOVERSION N/A 05/22/2016   Procedure: CARDIOVERSION;  Surgeon: Lelon Perla, MD;  Location: [facility] ENDOSCOPY;  Service: Cardiovascular;  Laterality: N/A;  . CARDIOVERSION N/A 08/10/2017   Procedure: CARDIOVERSION;  Surgeon: Pixie Casino, MD;  Location: [facility] ENDOSCOPY;  Service: Cardiovascular;  Laterality: N/A;  . CATARACT EXTRACTION W/ INTRAOCULAR LENS  IMPLANT, BILATERAL Bilateral June/-July 2009   Dr. Katy Fitch  . COLON RESECTION  02/09/2011   Procedure: LAPAROSCOPIC SIGMOID COLON RESECTION;  Surgeon: Pedro Earls, MD;  Location: [facility] ORS;  Service: General;  Laterality: N/A;  Laparoscopic Assisted Sigmoid Colectomy  . COLON SURGERY    . COLONOSCOPY  08/31/2011   Procedure: COLONOSCOPY;  Surgeon:  Jerene Bears, MD;  Location: [facility] END[facility];  Service: Gastroenterology;  Laterality: N/A;  . COLONOSCOPY N/A 09/05/2012   Procedure: COLONOSCOPY;  Surgeon: Jerene Bears, MD;  Location: [facility] ENDOSCOPY;  Service: Gastroenterology;  Laterality: N/A;  . COLONOSCOPY N/A 04/18/2013   Procedure: COLONOSCOPY;  Surgeon: Jerene Bears, MD;  Location: [facility] ENDOSCOPY;  Service: Gastroenterology;  Laterality: N/A;  . COLONOSCOPY N/A 04/09/2014    Procedure: COLONOSCOPY;  Surgeon: Jerene Bears, MD;  Location: [facility] ENDOSCOPY;  Service: Gastroenterology;  Laterality: N/A;  . COLONOSCOPY WITH PROPOFOL N/A 05/03/2017   Procedure: COLONOSCOPY WITH PROPOFOL;  Surgeon: Jerene Bears, MD;  Location: [facility] ENDOSCOPY;  Service: Gastroenterology;  Laterality: N/A;  . CORONARY ANGIOPLASTY    . CORONARY ARTERY BYPASS GRAFT  01/2002   LIMA-LAD, SVG-OM 2-OM 3, SVG-RPDA-RPLB  . CORONARY STENT INTERVENTION N/A 11/22/2018   Procedure: CORONARY STENT INTERVENTION;  Surgeon: Nelva Bush, MD;  Location: [place] CV LAB;  Service: Cardiovascular;  Laterality: N/A;  SVG - RCA  . ICD GENERATOR CHANGE  2010   Medtronic Virtuoso II VR ICD  . ICD GENERATOR CHANGEOUT N/A 12/07/2018   Procedure: ICD GENERATOR CHANGEOUT;  Surgeon: Deboraha Sprang, MD;  Location: [place] CV LAB;  Service: Cardiovascular;  Laterality: N/A;  . INGUINAL HERNIA REPAIR Right 2000's X 2  . LAPAROSCOPIC RIGHT HEMI COLECTOMY N/A 11/04/2012   Procedure: LAPAROSCOPIC RIGHT HEMI COLECTOMY;  Surgeon: Pedro Earls, MD;  Location: [facility] ORS;  Service: General;  Laterality: N/A;  . LEFT HEART CATH AND CORS/GRAFTS ANGIOGRAPHY Left 11/22/2018   Procedure: LEFT HEART CATH AND CORS/GRAFTS ANGIOGRAPHY;  Surgeon: Nelva Bush, MD;  Location: [place] CV LAB;  Service: Cardiovascular;  Laterality: Left;  . TONSILLECTOMY  ~ 1950   HPI:  Mark Clayton is a 79 y.o. male who was admitted 3/9 with traumatic ICH. PMH including but not limited to PAF on xarelto, MI, LV thrombus, ICM, HTN, HLD, CAD, CA of sigmoid colon s/p surgery (see "past medical history" for rest). CT (3/9) revealing a combination of left parafalcine subdural hematoma and subarachnoid hemorrhage. This is related to trauma given history of fall. No acute ischemic infarct on CT. Presents with Expressive > receptive apahsia. H/o GERD ad coughing.    Assessment / Plan / Recommendation Clinical Impression   Patient presents with  expressive>receptive aphasia and dysarthria. Patient was able to follow simple directions and name objects within the room. His intelligibility is reduced, which made it difficult to understand him at the phrase/conversation level, at times. He was also noted to have paraphasias and word finding errors in converation, but became aware of the difficulties and corrected himself with Min cues. Pt was provided education regarding compensatory strategies - take breaks as needed, mentally visualize the objects, and use other describing words to identify words. Pt will continue to benefit from services acutely. Pt and family were agreeable with plan.    SLP Assessment  SLP Recommendation/Assessment: Patient needs continued Speech Lanaguage Pathology Services SLP Visit Diagnosis: Aphasia (R47.01);Dysarthria and anarthria (R47.1)    Follow Up Recommendations  Other (comment)(tba)    Frequency and Duration min 2x/week  2 weeks      SLP Evaluation Cognition  Overall Cognitive Status: Within Functional Limits for tasks assessed Orientation Level: Oriented X4       Comprehension  Auditory Comprehension Overall Auditory Comprehension: Appears within functional limits for tasks assessed    Expression Expression Primary Mode of Expression: Verbal Verbal Expression Overall Verbal Expression:  Impaired Initiation: Impaired Level of Generative/Spontaneous Verbalization: Conversation Interfering Components: Speech intelligibility Effective Techniques: Open ended questions;Sentence completion   Oral / Motor  Oral Motor/Sensory Function Overall Oral Motor/Sensory Function: Within functional limits Motor Speech Overall Motor Speech: Impaired Respiration: Within functional limits Phonation: Normal Resonance: Within functional limits Articulation: Impaired Level of Impairment: Conversation Intelligibility: Intelligibility reduced Word: 75-100% accurate Phrase: 75-100% accurate Sentence: 75-100%  accurate Conversation: 75-100% accurate Motor Planning: Witnin functional limits   GO                   Aline August, Student SLP Office: [phone]  06/07/2019, 12:03 PM

## 2019-06-07 NOTE — Progress Notes (Signed)
  Echocardiogram 2D Echocardiogram with definity has been performed.  Mark Clayton M 06/07/2019, 2:45 PM

## 2019-06-07 NOTE — Progress Notes (Signed)
STROKE TEAM PROGRESS NOTE   INTERVAL HISTORY Sister at bedside. Pt awake alert still has mild expressive aphasia and dysarthria, right hemiplegia, right facial droop. On cleviprex. Will repeat CT head today.   Vitals:   06/07/19 0630 06/07/19 0645 06/07/19 0700 06/07/19 0800  BP: (!) 134/59 121/68 (!) 126/58 132/63  Pulse: 79 77 95 83  Resp: 19 (!) 22 (!) 21 17  Temp:    98.8 F (37.1 C)  TempSrc:    Oral  SpO2: 94% 95% 94% 95%  Weight:        CBC:  Recent Labs  Lab 06/02/19 1917 06/06/19 1549 06/06/19 1549 06/06/19 1551 06/07/19 0540  WBC 6.2 5.9  --   --  9.8  NEUTROABS 3.6 3.5  --   --   --   HGB 11.7* 12.2*   < > 12.2* 11.6*  HCT 36.4* 37.6*   < > 36.0* 35.4*  MCV 86 88.3  --   --  86.8  PLT 170 176  --   --  177   < > = values in this interval not displayed.    Basic Metabolic Panel:  Recent Labs  Lab 06/06/19 1549 06/06/19 1549 06/06/19 1551 06/07/19 0540  NA 141   < > 142 144  K 3.6   < > 3.5 3.1*  CL 107   < > 106 111  CO2 25  --   --  23  GLUCOSE 133*   < > 124* 125*  BUN 13   < > 12 10  CREATININE 1.27*   < > 1.20 1.01  CALCIUM 9.1  --   --  8.9  MG  --   --   --  1.9  PHOS  --   --   --  1.8*   < > = values in this interval not displayed.   Lipid Panel:     Component Value Date/Time   CHOL 182 11/16/2018 0900   TRIG 120 11/16/2018 0900   HDL 56 11/16/2018 0900   CHOLHDL 3.3 11/16/2018 0900   CHOLHDL 3 01/17/2018 0834   VLDL 18.8 01/17/2018 0834   LDLCALC 102 (H) 11/16/2018 0900   HgbA1c:  Lab Results  Component Value Date   HGBA1C 6.2 12/07/2016   Urine Drug Screen: No results found for: LABOPIA, COCAINSCRNUR, LABBENZ, AMPHETMU, THCU, LABBARB  Alcohol Level     Component Value Date/Time   ETH <10 06/06/2019 1549    IMAGING past 24 hours CT Code Stroke CTA Head W/WO contrast  Result Date: 06/06/2019 CLINICAL DATA:  Stroke EXAM: CT PERFUSION BRAIN TECHNIQUE: Multiphase CT imaging of the brain was performed following IV bolus  contrast injection. Subsequent parametric perfusion maps were calculated using RAPID software. CONTRAST:  15mL OMNIPAQUE IOHEXOL 350 MG/ML SOLN COMPARISON:  CTA head 06/06/2019 FINDINGS: CT Brain Perfusion Findings: CBF (<30%) Volume: 27mL Perfusion (Tmax>6.0s) volume: 76mL Mismatch Volume: 49mL ASPECTS on noncontrast CT Head: Aspect score not calculated due to intracranial hemorrhage. Infarct Core: 9 mL Infarction Location:Left parietal lobe IMPRESSION: 9 mL of acute infarction left parietal lobe with 81 mL of surrounding penumbra. Electronically Signed   By: Franchot Gallo M.D.   On: 06/06/2019 19:10   CT Code Stroke CTA Neck W/WO contrast  Result Date: 06/06/2019 CLINICAL DATA:  Code stroke. Fell and hit head. Intracranial hemorrhage. Slurred speech and right-sided weakness. EXAM: CT ANGIOGRAPHY HEAD AND NECK CT PERFUSION BRAIN TECHNIQUE: Multidetector CT imaging of the head and neck was performed using the standard  protocol during bolus administration of intravenous contrast. Multiplanar CT image reconstructions and MIPs were obtained to evaluate the vascular anatomy. Carotid stenosis measurements (when applicable) are obtained utilizing NASCET criteria, using the distal internal carotid diameter as the denominator. Multiphase CT imaging of the brain was performed following IV bolus contrast injection. Subsequent parametric perfusion maps were calculated using RAPID software. CONTRAST:  100 mL Omnipaque 350 IV COMPARISON:  CT head approximately 2 hours prior FINDINGS: CT HEAD FINDINGS Brain: Acute extra-axial hemorrhage left posterior frontal convexity appears unchanged. This is a combination of subdural para falcine blood as well as adjacent subarachnoid hemorrhage. The largest area of hemorrhage measures approximately 25 x 15 mm. Ventricle size normal. No acute ischemic infarct. Negative for hemorrhage or mass. Vascular: Negative for hyperdense vessel. Arterial calcification diffusely Skull: Negative for  skull fracture Sinuses/Orbits: Mucosal edema paranasal sinuses. Contracted right maxillary sinus. Bilateral cataract surgery.  No orbital mass. Other: None ASPECTS ([place] Stroke Program Early CT Score) Not applicable due to intracranial hemorrhage. CTA NECK FINDINGS Aortic arch: Standard branching. Imaged portion shows no evidence of aneurysm or dissection. No significant stenosis of the major arch vessel origins. Atherosclerotic disease in the aortic arch and proximal great vessels. Pacemaker noted. Right carotid system: Atherosclerotic disease right carotid bifurcation. Approximately 50% diameter stenosis proximal right internal carotid artery. Mild atherosclerotic disease right common carotid artery. Left carotid system: Atherosclerotic disease left carotid bifurcation without significant stenosis. Vertebral arteries: Both vertebral arteries patent to the basilar without significant stenosis. Skeleton: Degenerative changes cervical spine. No acute skeletal abnormality. Other neck: Negative Upper chest: Lung apices clear bilaterally.  Left-sided pacemaker. Review of the MIP images confirms the above findings CTA HEAD FINDINGS Anterior circulation: Atherosclerotic calcification in the cavernous carotid bilaterally causing mild stenosis. Right middle cerebral artery widely patent. Both anterior cerebral arteries widely patent. Left M1 widely patent. There is a branch occlusion left M3. There is flow past the short segment occlusion with decreased perfusion in this area. Probable additional distal emboli past the occlusion. This corresponds to an area of ischemia on CT perfusion. Posterior circulation: Mild atherosclerotic stenosis of the distal vertebral artery bilaterally. Right PICA patent. Left PICA not visualized. AICA patent bilaterally. Mild atherosclerotic disease in the basilar. Superior cerebellar and posterior cerebral arteries patent bilaterally. Fetal origin right posterior cerebral artery. Venous  sinuses: Patent Anatomic variants: None Review of the MIP images confirms the above findings CT Brain Perfusion Findings: ASPECTS: Not scored due to intracranial hemorrhage CBF (<30%) Volume: 81mL Perfusion (Tmax>6.0s) volume: 12mL Mismatch Volume: 42mL Infarction Location:Left parietal lobe IMPRESSION: 1. Left convexity hemorrhage unchanged from earlier today. This appears to be a combination of left parafalcine subdural hematoma and subarachnoid hemorrhage. This is related to trauma given history of fall. No acute ischemic infarct on CT. 2. 50% diameter stenosis proximal right internal carotid artery. Left carotid artery patent without significant stenosis. Both vertebral arteries patent in the neck. 3. Segmental occlusion left M3 branch with trickle distal flow and additional clot distally. 4. CT perfusion demonstrates 9 mL of core infarct and 81 mL of additional ischemia in the left parietal lobe corresponding to the left M3 branch occlusion. 5. These results were called by telephone at the time of interpretation on 06/06/2019 at 6:44 pm to provider ERIC [facility] , who verbally acknowledged these results. Electronically Signed   By: Franchot Gallo M.D.   On: 06/06/2019 18:45   CT Code Stroke Cerebral Perfusion with contrast  Result Date: 06/06/2019 CLINICAL DATA:  Stroke EXAM:  CT PERFUSION BRAIN TECHNIQUE: Multiphase CT imaging of the brain was performed following IV bolus contrast injection. Subsequent parametric perfusion maps were calculated using RAPID software. CONTRAST:  163mL OMNIPAQUE IOHEXOL 350 MG/ML SOLN COMPARISON:  CTA head 06/06/2019 FINDINGS: CT Brain Perfusion Findings: CBF (<30%) Volume: 31mL Perfusion (Tmax>6.0s) volume: 46mL Mismatch Volume: 6mL ASPECTS on noncontrast CT Head: Aspect score not calculated due to intracranial hemorrhage. Infarct Core: 9 mL Infarction Location:Left parietal lobe IMPRESSION: 9 mL of acute infarction left parietal lobe with 81 mL of surrounding penumbra.  Electronically Signed   By: Franchot Gallo M.D.   On: 06/06/2019 19:10   DG Chest Port 1 View  Result Date: 06/07/2019 CLINICAL DATA:  CVA. EXAM: PORTABLE CHEST 1 VIEW COMPARISON:  06/02/2019 FINDINGS: The pacer wires/AICD are stable. Stable mild cardiac enlargement. Diffuse slightly nodular interstitial process in the lungs could suggest interstitial edema or interstitial pneumonitis. No focal infiltrates or effusions. IMPRESSION: Diffuse slightly nodular interstitial process in the lungs could suggest interstitial edema or interstitial pneumonitis. Electronically Signed   By: Marijo Sanes M.D.   On: 06/07/2019 08:32   CT HEAD CODE STROKE WO CONTRAST  Result Date: 06/06/2019 CLINICAL DATA:  Code stroke. Fell and hit head with right-sided weakness and slurred speech. EXAM: CT HEAD WITHOUT CONTRAST TECHNIQUE: Contiguous axial images were obtained from the base of the skull through the vertex without intravenous contrast. COMPARISON:  07/03/2013 FINDINGS: There is motion artifact through the skull base. Brain: There is a focal collection of acute extra-axial hemorrhage in the left interhemispheric fissure at the vertex measuring 3.3 x 1.9 cm which may be subdural or subarachnoid. Additional small volume subdural or subarachnoid hemorrhage is also present more inferiorly and anteriorly in the interhemispheric fissure to the left of midline. Scattered small areas of hyperattenuation along the right greater than left anterior frontal lobes may represent artifact versus a small amount of additional subarachnoid hemorrhage. No acute infarct or midline shift is evident. There is mild cerebral atrophy. Vascular: Calcified atherosclerosis at the skull base. No hyperdense vessel. Skull: No fracture or suspicious osseous lesion. Sinuses/Orbits: Small right maxillary sinus. Clear paranasal sinuses and mastoid air cells. Bilateral cataract extraction. Other: Mild posterior scalp swelling/small hematoma. ASPECTS [facility]  Stroke Program Early CT Score) Not scored due to the presence of hemorrhage. IMPRESSION: 1. Extra-axial hemorrhage in the left interhemispheric fissure as above. No significant mass effect. 2. No acute infarct identified. These results were communicated to Dr. Cheral Marker at 4:02 pm on 06/06/2019 by text page via the [facility] messaging system. Electronically Signed   By: Logan Bores M.D.   On: 06/06/2019 16:04    PHYSICAL EXAM  Temp:  [96.3 F (35.7 C)-98.8 F (37.1 C)] 98.8 F (37.1 C) (03/10 0800) Pulse Rate:  [33-120] 72 (03/10 1000) Resp:  [10-26] 22 (03/10 1000) BP: (90-152)/(50-106) 131/67 (03/10 1000) SpO2:  [92 %-100 %] 94 % (03/10 1000) Weight:  [72.5 kg] 72.5 kg (03/09 2011)  General - Well nourished, well developed, in no apparent distress.  Ophthalmologic - fundi not visualized due to noncooperation.  Cardiovascular - irregularly irregular heart rate and rhythm.  Neuro - awake alert and orientated to place, people and age, month but not to year. Following simple commands, however, mild expressive aphasia with paraphasic errors. Difficulty with repeating sentences and naming. No gaze preference, no nystagmus, PERRL, EOMI. Visual field full. Right facial droop. Right hemiplegia, 1/5 on pain stimulation. Left UE and LE 5/5. DTR diminished on the right, no babinski. Sensation subjectively  symmetric, coordination intact on the left FTN. Gait not able to test.    ASSESSMENT/PLAN Mark Clayton is a 79 y.o. male with history of  PAF on Xarelto, myocardial infarct, hyperlipidemia, hypertension, congestive heart failure, carotid bruit, CAD along with AICD presenting after working in the yard where he fell backwards on a concrete driving and developed R hemiparesis, dysarthria. BP stable. CT showed hemorrhage. Xarelto reversed. Not sent to IR from ED given distal clot.   Stroke: L MCA infarct with left M2 occlusion, embolic secondary to known AF on AC  Traumatic left vertex SDH  Xarelto  reversed with Kcentra   NSG (Pool) consulted. No role for surgical intervention.  Code Stroke CT head extra-axial L interhemispheric hemorrhage  CTA head & neck L convexity hemorrhage (L parafalcine SDH and SAH) d/t trauma. Proximal R ICA 50% stenosis. L M3 branch occlusion w/ trickle flow and distal clot.   CT perfusion 18mL L parietal lobe infarct w/ 91mL penumbra  MRI not able to perform due to AICD lead not compatible   CT repeat pending  2D Echo pending   LDL 60   HgbA1c 6.1   SCDs for VTE prophylaxis  clopidogrel 75 mg daily and Xarelto (rivaroxaban) daily prior to admission, now on No antithrombotic given hemorrhage.    Therapy recommendations:  pending   Disposition:  pending   Atrial Fibrillation  Home anticoagulation:  Xarelto (rivaroxaban) daily  . tikosyn 250 bid and coreg (as below)  . Currently on no AC due to SDH . Rate in control   Hypertension  Home meds:  Coreg 25 q hs vs 6.25 bid, lasix 40, isosorbide 30  Stable . SBP < 140  . On Cleviprex gtt . Adjust BP goal after CT head . Long-term BP goal normotensive  Hyperlipidemia  Home meds:  lipitor 80 and zetia 10  Hold off due to SDH for now  LDL 60, goal < 70  Continue statin at discharge  Dysphagia . Secondary to stroke . NPO . Speech on board . Pending speech evaluation   Other Stroke Risk Factors  Advanced age  ETOH use, alcohol level <10, advised to drink no more than 2 drink(s) a day  Coronary artery disease s/p MI  Chronic systolic Congestive heart failure  Ischemic cardiomyopathy   Hx LV mural thrombus  AICD Medtronic  Other Active Problems  Nausea and vomiting d/t hemorrhage, prn zofran  Hypokalemia 3.1 - supplement  Hospital day # 1  This patient is critically ill due to left MCA infarct, traumatic SDH, afib on AC, dysphagia and at significant risk of neurological worsening, death form SDH expansion, recurrent stroke, hemorrhagic conversion, heart failure,  aspiration. This patient's care requires constant monitoring of vital signs, hemodynamics, respiratory and cardiac monitoring, review of multiple databases, neurological assessment, discussion with family, other specialists and medical decision making of high complexity. I spent 40 minutes of neurocritical care time in the care of this patient. I had long discussion with sister at bedside, updated pt current condition, treatment plan and potential prognosis, and answered all the questions. She expressed understanding and appreciation. I also discussed with Dr. Rolla Etienne.   Rosalin Hawking, MD PhD Stroke Neurology 06/07/2019 10:36 AM   To contact Stroke Continuity provider, please refer to [email or web address] After hours, contact General Neurology

## 2019-06-07 NOTE — Progress Notes (Signed)
Updated E-Link MD Ogan that 12 lead ECG has been completed. Also updated MD that patient is currently NPO and can not have oral tylenol for pain. Awaiting new orders for pain medication. Will continue to monitor.

## 2019-06-07 NOTE — Progress Notes (Signed)
E-Link MD notified patient is having nausea, with no PRN medications to treat nausea/vomiting. Awaiting new orders. Will continue to monitor.

## 2019-06-07 NOTE — Progress Notes (Signed)
E-Link MD notified for second time that patient is having nausea and has no PRN medications for nausea/vomiting. Awaiting new orders. Will continue to monitor.

## 2019-06-07 NOTE — Progress Notes (Signed)
Overall stable.  Patient denies headache.  Patient a little bit more bright and his speech is more clear today.  Right-sided weakness unchanged.  Status post left paracentral hemorrhage.  Recommend follow-up head CT scan tomorrow.  No indication for neurosurgical intervention.

## 2019-06-07 NOTE — Progress Notes (Signed)
Patient's home meds sealed and delivered to pharmacy.

## 2019-06-07 NOTE — Progress Notes (Signed)
Modified Barium Swallow Progress Note  Patient Details  Name: [name] MRN: [id number] Date of Birth: May 16, 1940  Today's Date: 06/07/2019  Modified Barium Swallow completed.  Full report located under Chart Review in the Imaging Section.  Brief recommendations include the following:  Clinical Impression   Pt presents with oropharyngeal dysphagia across consistencies. Oral phase is remarkable for lingual residue. Pharyngeal phase is remarkable for reduced BOT retraction and epiglottic inversion resulting in vallecular and pyriform sinsus residue. Vallecular residue significantly increased with progression of advanced textures, and multiple strategies were attempted to reduce the amount, with the effortful swallow being the most effective. Trace penetration (PAS 3) occured x2 as the pt also utilized a liquid wash to reduce vallecular residue following advanced textures, however, it was not observed following any other textures. There was frequent coughing and throat clearing as seen at bedside, but was not related to aspiration. Given the penetration and amount of residue with advanced textures, recommend Dys 2 and thin liquids, utilizing liquid wash PRN, multiple hard swallows following each bite/sip, clearing throat intermittently.    Swallow Evaluation Recommendations   SLP Diet Recommendations: Dysphagia 2 (Fine chop) solids;Thin liquid   Liquid Administration via: Cup;Straw;Spoon   Medication Administration: Crushed with puree   Supervision: Full assist for feeding;Staff to assist with self feeding   Compensations: Minimize environmental distractions;Slow rate;Small sips/bites;Multiple dry swallows after each bite/sip;Follow solids with liquid;Effortful swallow   Postural Changes: Seated upright at 90 degrees;Remain semi-upright after after feeds/meals (Comment)   Oral Care Recommendations: Oral care BID       Aline August, Student SLP Office: [phone]  06/07/2019,1:39  PM

## 2019-06-08 DIAGNOSIS — I63411 Cerebral infarction due to embolism of right middle cerebral artery: Secondary | ICD-10-CM

## 2019-06-08 LAB — CBC
HCT: 36.6 % — ABNORMAL LOW (ref 39.0–52.0)
Hemoglobin: 12 g/dL — ABNORMAL LOW (ref 13.0–17.0)
MCH: 29 pg (ref 26.0–34.0)
MCHC: 32.8 g/dL (ref 30.0–36.0)
MCV: 88.4 fL (ref 80.0–100.0)
Platelets: 163 10*3/uL (ref 150–400)
RBC: 4.14 MIL/uL — ABNORMAL LOW (ref 4.22–5.81)
RDW: 14.8 % (ref 11.5–15.5)
WBC: 8.3 10*3/uL (ref 4.0–10.5)
nRBC: 0 % (ref 0.0–0.2)

## 2019-06-08 LAB — BASIC METABOLIC PANEL
Anion gap: 10 (ref 5–15)
BUN: 14 mg/dL (ref 8–23)
CO2: 23 mmol/L (ref 22–32)
Calcium: 8.9 mg/dL (ref 8.9–10.3)
Chloride: 108 mmol/L (ref 98–111)
Creatinine, Ser: 1.2 mg/dL (ref 0.61–1.24)
GFR calc Af Amer: >60 mL/min (ref >60)
GFR calc non Af Amer: 58 mL/min — ABNORMAL LOW (ref >60)
Glucose, Bld: 134 mg/dL — ABNORMAL HIGH (ref 70–99)
Potassium: 3.4 mmol/L — ABNORMAL LOW (ref 3.5–5.1)
Sodium: 141 mmol/L (ref 135–145)

## 2019-06-08 LAB — MAGNESIUM: Magnesium: 2.2 mg/dL (ref 1.7–2.4)

## 2019-06-08 LAB — PHOSPHORUS: Phosphorus: 3 mg/dL (ref 2.5–4.6)

## 2019-06-08 MED ORDER — POTASSIUM CHLORIDE CRYS ER 20 MEQ PO TBCR
40.0000 meq | EXTENDED_RELEASE_TABLET | Freq: Once | ORAL | Status: AC
  Administered 2019-06-08: 40 meq via ORAL
  Filled 2019-06-08: qty 2

## 2019-06-08 NOTE — Evaluation (Signed)
Physical Therapy Evaluation Patient Details Name: Mark Clayton MRN: [id number] DOB: 10-14-1940 Today's Date: 06/08/2019   History of Present Illness  Mark Clayton is a 79 y.o. male who was admitted 3/9 with traumatic ICH after falling backwards onto concrete. PMH including PAF on xarelto, MI, LV thrombus, ICM, HTN, HLD, CAD, CA of sigmoid colon s/p surgery. CT (3/9) revealing a combination of left parafalcine subdural hematoma and subarachnoid hemorrhage. No acute ischemic infarct on CT.   Clinical Impression  Prior to admission, pt lives with his granddaughter and is independent with ADL's and mobility. He is 80% blind at baseline and does not drive; however, his granddaughter states he accommodates very well and still enjoys wood working and baking. Pt presents with decreased functional mobility secondary to right hemiparesis, poor sitting balance, right inattention, decreased cognition and communication impairment. Pt requiring two person maximal assist for bed mobility; able to stand from edge of bed and initially weight bear through RLE but then with knee buckle with sustained standing. Given PLOF and motivation, highly recommending CIR to address deficits and maximize functional mobility.     Follow Up Recommendations CIR;Supervision/Assistance - 24 hour    Equipment Recommendations  Wheelchair (measurements PT);Wheelchair cushion (measurements PT)    Recommendations for Other Services Rehab consult     Precautions / Restrictions Precautions Precautions: Fall;Other (comment) Precaution Comments: R hemiparesis Restrictions Weight Bearing Restrictions: No      Mobility  Bed Mobility Overal bed mobility: Needs Assistance Bed Mobility: Supine to Sit;Sit to Supine     Supine to sit: Max assist;+2 for physical assistance Sit to supine: Max assist;+2 for physical assistance   General bed mobility comments: MaxA + 2 for supine <> sit. Pt able to progress LLE to edge of bed and  hold onto bed rail with LUE. Minimal push off and requiring assist for trunk elevation, RLE negotiation, and use of bed pad to pull hips anteriorly. Trunk and leg guidance required for return to supine  Transfers Overall transfer level: Needs assistance Equipment used: None Transfers: Sit to/from Stand Sit to Stand: +2 physical assistance;Mod assist;Max assist         General transfer comment: ModA + 2 to stand from edge of bed with right knee blocked, pt with good initiation and activation. R knee buckle after ~5 seconds requiring maxA to maintain standing.   Ambulation/Gait                Stairs            Wheelchair Mobility    Modified Rankin (Stroke Patients Only) Modified Rankin (Stroke Patients Only) Pre-Morbid Rankin Score: No significant disability Modified Rankin: Severe disability     Balance Overall balance assessment: Needs assistance Sitting-balance support: Feet supported Sitting balance-Leahy Scale: Zero Sitting balance - Comments: Pt requiring up to maxA due to right lateral lean, able to correct minimally with LUE on rail  Postural control: Right lateral lean                                   Pertinent Vitals/Pain Pain Assessment: Faces Faces Pain Scale: No hurt    Home Living Family/patient expects to be discharged to:: Private residence Living Arrangements: Other relatives(granddaughte, [name] (in 20's)) Available Help at Discharge: Family(sister lives down st, daughters, ex wife) Type of Home: House Home Access: Other (comment)(threshold)     Home Layout: Able to live on main level  with bedroom/bathroom   Additional Comments: Granddaughter goes to college T-Sa 10-2; states there are other family members who are offering to provide 24/7 i.e. sister lives down street, ex wife, daughters    Prior Function Level of Independence: Independent         Comments: 80% blind, does not drive. Manages own medications with  intermittent assist from granddaughter for set up, cooks, cleans. Enjoys wood working, Control and instrumentation engineer.       Hand Dominance        Extremity/Trunk Assessment   Upper Extremity Assessment Upper Extremity Assessment: RUE deficits/detail RUE Deficits / Details: No active movement noted    Lower Extremity Assessment Lower Extremity Assessment: RLE deficits/detail RLE Deficits / Details: No active movement noted       Communication   Communication: Receptive difficulties;Expressive difficulties  Cognition Arousal/Alertness: Awake/alert Behavior During Therapy: WFL for tasks assessed/performed Overall Cognitive Status: Difficult to assess Area of Impairment: Following commands;Memory;Attention;Safety/judgement;Awareness                   Current Attention Level: Focused;Sustained Memory: Decreased short-term memory Following Commands: Follows one step commands inconsistently Safety/Judgement: Decreased awareness of safety;Decreased awareness of deficits Awareness: Intellectual   General Comments: Pt with expressive > receptive aphasia. Difficult to assess orientation; pt able to recall date, stating "yes," when asked if he was in a hospital. Recalled traumatic incident but not imaging findings i.e. that he had a SDH/SAH and its functional impact. Pt tending to give verbose, tangential answers when asked a question, most of which was unintelligible. Did not know how to use call bell; able to recall with min cues after period of time.       General Comments      Exercises     Assessment/Plan    PT Assessment Patient needs continued PT services  PT Problem List Decreased strength;Decreased range of motion;Decreased balance;Decreased mobility;Decreased cognition;Decreased safety awareness;Impaired sensation       PT Treatment Interventions DME instruction;Gait training;Functional mobility training;Therapeutic activities;Therapeutic exercise;Balance training;Patient/family  education    PT Goals (Current goals can be found in the Care Plan section)  Acute Rehab PT Goals Patient Stated Goal: none stated; pt granddaughter would like him to go to rehab PT Goal Formulation: With patient/family Time For Goal Achievement: 06/22/19 Potential to Achieve Goals: Fair    Frequency Min 4X/week   Barriers to discharge        Co-evaluation PT/OT/SLP Co-Evaluation/Treatment: Yes Reason for Co-Treatment: Complexity of the patient's impairments (multi-system involvement);Necessary to address cognition/behavior during functional activity;For patient/therapist safety;To address functional/ADL transfers PT goals addressed during session: Mobility/safety with mobility;Balance         AM-PAC PT "6 Clicks" Mobility  Outcome Measure Help needed turning from your back to your side while in a flat bed without using bedrails?: Total Help needed moving from lying on your back to sitting on the side of a flat bed without using bedrails?: Total Help needed moving to and from a bed to a chair (including a wheelchair)?: Total Help needed standing up from a chair using your arms (e.g., wheelchair or bedside chair)?: Total Help needed to walk in hospital room?: Total Help needed climbing 3-5 steps with a railing? : Total 6 Click Score: 6    End of Session Equipment Utilized During Treatment: Gait belt Activity Tolerance: Patient tolerated treatment well Patient left: in bed;with call bell/phone within reach;with bed alarm set Nurse Communication: Mobility status PT Visit Diagnosis: Other symptoms and signs involving the nervous system (  R29.898);Hemiplegia and hemiparesis;Other abnormalities of gait and mobility (R26.89) Hemiplegia - Right/Left: Right Hemiplegia - dominant/non-dominant: Dominant    Time: [id number] PT Time Calculation (min) (ACUTE ONLY): 32 min   Charges:   PT Evaluation $PT Eval Moderate Complexity: 1 Mod            Wyona Almas, PT, DPT Acute  Rehabilitation Services Pager [phone] Office [phone]   YORDIN MAIS 06/08/2019, 11:15 AM

## 2019-06-08 NOTE — Progress Notes (Signed)
[name] pt's daughter updated at this time.

## 2019-06-08 NOTE — Consult Note (Signed)
Physical Medicine and Rehabilitation Consult   Reason for Consult: Stroke with functional deficits.  Referring Physician: Dr. Wyline Copas   HPI: Mark Clayton is a 79 y.o. male with history of CAD, colon cancer, Macular degeneration--legally blind, chronic systolic CHF s/p AICD, PAF- on Xarelto who was admitted on 06/06/19 for work up after falling backwards in the yard, sticking his head and developing left sided weakness. CT head showed left hemispheric SDH and Xarelto reversed with Kcentra.  CTA head/neck/perfusion showed branch occlusion left M3 with corresponding acute infarct in left parietal lobe with surrounding penumbra. Neurology fel that L-MCA infarct embolic due to a fib and no antithrombotic due to hemorrhage. Dr. Annette Stable recommended conservative management with serial CT head.   Elevated BP treated with cleviprex.  2D echo showed global hypokinesis with EF < 20% and moderately elevated pulmonary pressures. Follow up CT head showed stable bleed with interval development of left frontoparietal infarct. Mentation improving and therapy evaluations completed today. Patient with resultant right hemiparesis with right inattention, expressive > receptive aphasia with dysarthria and visual deficits  affecting mobility and ADLs. CIR recommended due to functional decline.    Review of Systems  HENT: Negative for hearing loss.   Eyes: Positive for blurred vision (very poor vision left worse than right). Negative for double vision.  Respiratory: Positive for cough.   Cardiovascular: Positive for chest pain (some tightness).  Gastrointestinal: Negative for abdominal pain and heartburn.  Neurological: Positive for speech change and focal weakness.  Psychiatric/Behavioral: The patient has insomnia.      Past Medical History:  Diagnosis Date  . AICD (automatic cardioverter/defibrillator) present 01/17/2003   Medtronic Maximo 7232CX ICD, serial [id number]  . Anemia 02-06-11   takes oral iron    . Arthritis    hands, knees  . CAD (coronary artery disease) 2003   a. h/o MI and CABG in 2003. b. s/p DES to SVG-RPDA-RPLB in 08/2014.  Marland Kitchen Cancer of sigmoid colon ([place]) 2012   a. s/p colon surgery.  . Carotid bruit   . Chronic systolic CHF (congestive heart failure) (HCC)    a. EF 20% in 2014; b. 08/2017 Echo: EF 20-25%, diff HK, Gr3 DD. Triv AI. Mod MR. Sev dil LA. Mildly dil RV w/ mildly reduced RV fxn. Mildly dil RA. Mod TR. PASP 56mHg.  Marland Kitchen Cough   . GERD (gastroesophageal reflux disease) 02-06-11  . HTN (hypertension)   . Hyperlipidemia   . Ischemic cardiomyopathy    a. EF 20% in 2014. (Master study EF >20%); b. 08/2017 Echo: EF 20-25%, diff HK. Gr3 DD.  . LV (left ventricular) mural thrombus    a. 12/2012 Echo: EF 20% with mural thrombus No evidence of thrombus on 08/2017 echo.  . Macular degeneration   . Myocardial infarct ([place])    2003  . PAF (paroxysmal atrial fibrillation) (HCC)    a. CHA2DS2VASc = 5-->Xarelto/Tikosyn.    Past Surgical History:  Procedure Laterality Date  . CARDIAC CATHETERIZATION N/A 09/20/2014   Procedure: Left Heart Cath and Coronary Angiography;  Surgeon: Jettie Booze, MD; LAD 95%, D1 100%, CFX liner percent, OM 200%, OM 390%, RCA 90%, LIMA-LAD okay, SVG-OM 2-OM 3 minimal disease, SVG-RPDA-RPLB 100% between the RPDA and RPL     . CARDIAC CATHETERIZATION N/A 09/20/2014   Procedure: Coronary Stent Intervention;  Surgeon: Jettie Booze, MD; Synergy DES 4 x 24 mm reducing the stenosis to 5%   . CARDIAC DEFIBRILLATOR PLACEMENT  01/17/03   [phone]  lead. Medtronic. remote-no; with later revision  . CARDIOVERSION N/A 05/22/2016   Procedure: CARDIOVERSION;  Surgeon: Lelon Perla, MD;  Location: [facility] ENDOSCOPY;  Service: Cardiovascular;  Laterality: N/A;  . CARDIOVERSION N/A 08/10/2017   Procedure: CARDIOVERSION;  Surgeon: Pixie Casino, MD;  Location: [facility] ENDOSCOPY;  Service: Cardiovascular;  Laterality: N/A;  . CATARACT EXTRACTION W/ INTRAOCULAR LENS   IMPLANT, BILATERAL Bilateral June/-July 2009   Dr. Katy Fitch  . COLON RESECTION  02/09/2011   Procedure: LAPAROSCOPIC SIGMOID COLON RESECTION;  Surgeon: Pedro Earls, MD;  Location: [facility] ORS;  Service: General;  Laterality: N/A;  Laparoscopic Assisted Sigmoid Colectomy  . COLON SURGERY    . COLONOSCOPY  08/31/2011   Procedure: COLONOSCOPY;  Surgeon: Jerene Bears, MD;  Location: [facility] ENDOSCOPY;  Service: Gastroenterology;  Laterality: N/A;  . COLONOSCOPY N/A 09/05/2012   Procedure: COLONOSCOPY;  Surgeon: Jerene Bears, MD;  Location: [facility] ENDOSCOPY;  Service: Gastroenterology;  Laterality: N/A;  . COLONOSCOPY N/A 04/18/2013   Procedure: COLONOSCOPY;  Surgeon: Jerene Bears, MD;  Location: [facility] ENDOSCOPY;  Service: Gastroenterology;  Laterality: N/A;  . COLONOSCOPY N/A 04/09/2014   Procedure: COLONOSCOPY;  Surgeon: Jerene Bears, MD;  Location: [facility] ENDOSCOPY;  Service: Gastroenterology;  Laterality: N/A;  . COLONOSCOPY WITH PROPOFOL N/A 05/03/2017   Procedure: COLONOSCOPY WITH PROPOFOL;  Surgeon: Jerene Bears, MD;  Location: [facility] ENDOSCOPY;  Service: Gastroenterology;  Laterality: N/A;  . CORONARY ANGIOPLASTY    . CORONARY ARTERY BYPASS GRAFT  01/2002   LIMA-LAD, SVG-OM 2-OM 3, SVG-RPDA-RPLB  . CORONARY STENT INTERVENTION N/A 11/22/2018   Procedure: CORONARY STENT INTERVENTION;  Surgeon: Nelva Bush, MD;  Location: [place] CV LAB;  Service: Cardiovascular;  Laterality: N/A;  SVG - RCA  . ICD GENERATOR CHANGE  2010   Medtronic Virtuoso II VR ICD  . ICD GENERATOR CHANGEOUT N/A 12/07/2018   Procedure: ICD GENERATOR CHANGEOUT;  Surgeon: Deboraha Sprang, MD;  Location: [place] [facility];  Service: Cardiovascular;  Laterality: N/A;  . INGUINAL HERNIA REPAIR Right 2000's X 2  . LAPAROSCOPIC RIGHT HEMI COLECTOMY N/A 11/04/2012   Procedure: LAPAROSCOPIC RIGHT HEMI COLECTOMY;  Surgeon: Pedro Earls, MD;  Location: [facility];  Service: General;  Laterality: N/A;  . LEFT HEART CATH AND CORS/GRAFTS ANGIOGRAPHY Left  11/22/2018   Procedure: LEFT HEART CATH AND CORS/GRAFTS ANGIOGRAPHY;  Surgeon: Nelva Bush, MD;  Location: [place] [facility];  Service: Cardiovascular;  Laterality: Left;  . TONSILLECTOMY  ~ 1950    Family History  Problem Relation Age of Onset  . Hypertension Father   . Hyperlipidemia Father   . Heart disease Father   . Prostate cancer Father   . Alzheimer's disease Mother   . Hypertension Sister   . Hyperlipidemia Sister   . Colon cancer Neg Hx   . Esophageal cancer Neg Hx   . Rectal cancer Neg Hx   . Stomach cancer Neg Hx     Social History: Widowed-- Lives with grand daughter who is adopted.  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 Independent PTA--legally blind and family helps with home management.  Per  reports that he has never smoked. He has never used smokeless tobacco. He reports current alcohol use of about 2.0 standard drinks of alcohol per week. He reports that he does not use drugs.   Allergies  Allergen Reactions  . Latex Rash  . Tape Rash and Other (See Comments)    USE PAPER    Medications Prior to Admission  Medication  Sig Dispense Refill  . atorvastatin (LIPITOR) 80 MG tablet Take 1 tablet (80 mg total) by mouth daily. 90 tablet 3  . carvedilol (COREG) 25 MG tablet Take 25 mg by mouth at bedtime.    . clopidogrel (PLAVIX) 75 MG tablet Take 1 tablet (75 mg total) by mouth daily with breakfast. 30 tablet 0  . ezetimibe (ZETIA) 10 MG tablet Take 1 tablet (10 mg total) by mouth daily. 90 tablet 3  . furosemide (LASIX) 40 MG tablet TAKE 1 TABLET BY MOUTH  DAILY (Patient taking differently: Take 40 mg by mouth daily. ) 90 tablet 1  . isosorbide mononitrate (IMDUR) 30 MG 24 hr tablet TAKE 1 TABLET BY MOUTH  DAILY (Patient taking differently: Take 30 mg by mouth daily. ) 90 tablet 3  . losartan (COZAAR) 50 MG tablet TAKE 1 TABLET BY MOUTH  DAILY 90 tablet 1  . nitroGLYCERIN (NITROSTAT) 0.4 MG SL tablet DISSOLVE 1 TABLET UNDER THE TONGUE EVERY 5 MINUTES  AS  NEEDED FOR CHEST PAIN. MAX  OF 3 TABLETS IN 15 MINUTES. CALL 911 IF PAIN PERSISTS. (Patient taking differently: Place 0.4 mg under the tongue every 5 (five) minutes as needed. ) 100 tablet 3  . pantoprazole (PROTONIX) 40 MG tablet TAKE 1 TABLET BY MOUTH  DAILY (Patient taking differently: Take 40 mg by mouth daily. ) 90 tablet 1  . pseudoephedrine-acetaminophen (TYLENOL SINUS) 30-500 MG TABS tablet Take 1 tablet by mouth every 4 (four) hours as needed.    . vitamin B-12 (CYANOCOBALAMIN) 100 MCG tablet Take 100 mcg by mouth daily.     . carvedilol (COREG) 6.25 MG tablet Take 1 tablet (6.25 mg total) by mouth 2 (two) times daily with a meal. (Patient not taking: Reported on 06/06/2019) 60 tablet 6  . dofetilide (TIKOSYN) 250 MCG capsule TAKE 1 CAPSULE BY MOUTH TWO TIMES DAILY (Patient not taking: Reported on 06/06/2019) 180 capsule 1  . meclizine (ANTIVERT) 25 MG tablet TAKE 1 TABLET BY MOUTH  DAILY AS NEEDED FOR  DIZZINESS. (Patient not taking: Reported on 06/06/2019) 90 tablet 0  . XARELTO 20 MG TABS tablet TAKE 1 TABLET BY MOUTH  DAILY WITH SUPPER (Patient not taking: Reported on 06/06/2019) 90 tablet 2    Home: [place] expects to be discharged to:: Private residence Living Arrangements: Other relatives Available Help at Discharge: Family Type of Home: House Home Access: Other (comment)(threshold) Home Layout: Able to live on main level with bedroom/bathroom Bathroom Shower/Tub: Tub/shower unit Additional Comments: Granddaughter goes to college T-Sa 10-2; states there are other family members who are offering to provide 24/7 i.e. sister lives down street, ex wife, daughters  Lives With: Spouse  Functional History: Prior Function Level of Independence: Independent Comments: 80% blind, does not drive. Manages own medications with intermittent assist from granddaughter for set up, cooks, cleans. Enjoys wood working, Control and instrumentation engineer.   Functional Status:  Mobility: Bed Mobility Overal  bed mobility: Needs Assistance Bed Mobility: Supine to Sit, Sit to Supine Supine to sit: Max assist, +2 for physical assistance Sit to supine: Max assist, +2 for physical assistance General bed mobility comments: MaxA + 2 for supine <> sit. Pt able to progress LLE to edge of bed and hold onto bed rail with LUE. Minimal push off and requiring assist for trunk elevation, RLE negotiation, and use of bed pad to pull hips anteriorly. Trunk and leg guidance required for return to supine Transfers Overall transfer level: Needs assistance Equipment used: None Transfers: Sit to/from Stand  Sit to Stand: +2 physical assistance, Mod assist, Max assist General transfer comment: ModA + 2 to stand from edge of bed with right knee blocked, pt with good initiation and activation. R knee buckle after ~5 seconds requiring maxA to maintain standing.       ADL: ADL Overall ADL's : Needs assistance/impaired Eating/Feeding: Minimal assistance Grooming: Wash/dry hands, Wash/dry face, Oral care, Sitting, Set up Upper Body Bathing: Moderate assistance, Sitting Lower Body Bathing: Total assistance, Maximal assistance, Bed level Upper Body Dressing : Minimal assistance Lower Body Dressing: Total assistance  Cognition: Cognition Overall Cognitive Status: Difficult to assess Orientation Level: Oriented X4 Cognition Arousal/Alertness: Awake/alert Behavior During Therapy: WFL for tasks assessed/performed Overall Cognitive Status: Difficult to assess Area of Impairment: Following commands, Memory, Attention, Safety/judgement, Awareness Current Attention Level: Focused, Sustained Memory: Decreased short-term memory Following Commands: Follows one step commands inconsistently Safety/Judgement: Decreased awareness of safety, Decreased awareness of deficits Awareness: Intellectual General Comments: Pt with expressive > receptive aphasia. Difficult to assess orientation; pt able to recall date, stating "yes," when  asked if he was in a hospital. Recalled traumatic incident but not imaging findings i.e. that he had a SDH/SAH and its functional impact. Pt tending to give verbose, tangential answers when asked a question, most of which was unintelligible. Did not know how to use call bell; able to recall with min cues after period of time.  Difficult to assess due to: Impaired communication   Blood pressure (!) 138/105, pulse 81, temperature 98.1 F (36.7 C), temperature source Oral, resp. rate 18, weight 77.9 kg, SpO2 97 %.  Physical Exam  Nursing note and vitals reviewed. Constitutional: He appears well-developed and well-nourished.  HEENT: Head is normocephalic, atraumatic, PERRLA, EOMI, sclera anicteric, oral mucosa pink and moist, dentition intact, ext ear canals clear,  Neck: Supple without JVD or lymphadenopathy Heart: An irregularly irregular rhythm present. No murmurs rubs or gallops Chest: CTA bilaterally without wheezes, rales, or rhonchi; no distress Abdomen: Soft, non-tender, non-distended, bowel sounds positive. Extremities: No clubbing, cyanosis, or edema. Pulses are 2+ Skin: Clean and intact without signs of breakdown Neuro: Alert, mild confusion. Right facial weakness with dysarthria. Expressive deficits with occasional jargon. Right hemiparesis. 0/5 throughout right side with exception of slight movement in right foot.  Psych: Pt's affect is appropriate. Pt is cooperative  Results for orders placed or performed during the hospital encounter of 06/06/19 (from the past 24 hour(s))  Urine rapid drug screen (hosp performed)     Status: None   Collection Time: 06/07/19  2:00 PM  Result Value Ref [place]   Opiates NONE DETECTED NONE DETECTED   Cocaine NONE DETECTED NONE DETECTED   Benzodiazepines NONE DETECTED NONE DETECTED   Amphetamines NONE DETECTED NONE DETECTED   Tetrahydrocannabinol NONE DETECTED NONE DETECTED   Barbiturates NONE DETECTED NONE DETECTED  Urinalysis, Routine w reflex  microscopic     Status: Abnormal   Collection Time: 06/07/19  2:00 PM  Result Value Ref Range   Color, Urine YELLOW YELLOW   APPearance CLEAR CLEAR   Specific Gravity, Urine 1.027 1.005 - 1.030   pH 6.0 5.0 - 8.0   Glucose, UA NEGATIVE NEGATIVE mg/dL   Hgb urine dipstick MODERATE (A) NEGATIVE   Bilirubin Urine NEGATIVE NEGATIVE   Ketones, ur 5 (A) NEGATIVE mg/dL   Protein, ur 30 (A) NEGATIVE mg/dL   Nitrite NEGATIVE NEGATIVE   Leukocytes,Ua NEGATIVE NEGATIVE   RBC / HPF 0-5 0 - 5 RBC/hpf   WBC, UA 0-5 0 -  5 WBC/hpf   Bacteria, UA NONE SEEN NONE SEEN   Squamous Epithelial / LPF 0-5 0 - 5  CBC     Status: Abnormal   Collection Time: 06/08/19  5:09 AM  Result Value Ref Range   WBC 8.3 4.0 - 10.5 K/uL   RBC 4.14 (L) 4.22 - 5.81 MIL/uL   Hemoglobin 12.0 (L) 13.0 - 17.0 g/dL   HCT 36.6 (L) 39.0 - 52.0 %   MCV 88.4 80.0 - 100.0 fL   MCH 29.0 26.0 - 34.0 pg   MCHC 32.8 30.0 - 36.0 g/dL   RDW 14.8 11.5 - 15.5 %   Platelets 163 150 - 400 K/uL   nRBC 0.0 0.0 - 0.2 %  Basic metabolic panel     Status: Abnormal   Collection Time: 06/08/19  5:09 AM  Result Value Ref Range   Sodium 141 135 - 145 mmol/L   Potassium 3.4 (L) 3.5 - 5.1 mmol/L   Chloride 108 98 - 111 mmol/L   CO2 23 22 - 32 mmol/L   Glucose, Bld 134 (H) 70 - 99 mg/dL   BUN 14 8 - 23 mg/dL   Creatinine, Ser 1.20 0.61 - 1.24 mg/dL   Calcium 8.9 8.9 - 10.3 mg/dL   GFR calc non Af Amer 58 (L) >60 mL/min   GFR calc Af Amer >60 >60 mL/min   Anion gap 10 5 - 15  Magnesium     Status: None   Collection Time: 06/08/19  5:09 AM  Result Value Ref Range   Magnesium 2.2 1.7 - 2.4 mg/dL  Phosphorus     Status: None   Collection Time: 06/08/19  5:09 AM  Result Value Ref [place]   Phosphorus 3.0 2.5 - 4.6 mg/dL   CT Code Stroke CTA Head W/WO contrast  Result Date: 06/06/2019 CLINICAL DATA:  Stroke EXAM: CT PERFUSION BRAIN TECHNIQUE: Multiphase CT imaging of the brain was performed following IV bolus contrast injection. Subsequent  parametric perfusion maps were calculated using RAPID software. CONTRAST:  172mL OMNIPAQUE IOHEXOL 350 MG/ML SOLN COMPARISON:  CTA head 06/06/2019 FINDINGS: CT Brain Perfusion Findings: CBF (<30%) Volume: 70mL Perfusion (Tmax>6.0s) volume: 65mL Mismatch Volume: 64mL ASPECTS on noncontrast CT Head: Aspect score not calculated due to intracranial hemorrhage. Infarct Core: 9 mL Infarction Location:Left parietal lobe IMPRESSION: 9 mL of acute infarction left parietal lobe with 81 mL of surrounding penumbra. Electronically Signed   By: Franchot Gallo M.D.   On: 06/06/2019 19:10   CT HEAD WO CONTRAST  Result Date: 06/07/2019 CLINICAL DATA:  Stroke.  Follow-up subdural hemorrhage EXAM: CT HEAD WITHOUT CONTRAST TECHNIQUE: Contiguous axial images were obtained from the base of the skull through the vertex without intravenous contrast. COMPARISON:  CTA head and CT perfusion 06/06/2019 FINDINGS: Brain: High-density acute hemorrhage over the left posterior frontal convexity is stable. This is a combination of subdural blood along the falx and subarachnoid hemorrhage over the convexity. No new hemorrhage. Ventricle size normal. Interval development of hypodensity in the left posterior lateral frontal lobe compatible with acute infarct. Additional area of hypodensity has developed in the left medial frontal parietal lobe compatible with acute infarct. Vascular: Negative for hyperdense vessel Skull: Negative Sinuses/Orbits: Contracted right maxillary sinus. Remaining sinuses clear. Bilateral cataract extraction. Other: None IMPRESSION: Para falcine subdural hematoma unchanged. Subarachnoid and subdural hemorrhage over the left convexity unchanged. Interval development of acute infarct in the left frontal parietal lobe. This involves the medial portion of the motor cortex. There may also be  involvement of the lateral portion of the motor cortex on the left. Electronically Signed   By: Franchot Gallo M.D.   On: 06/07/2019 11:32     CT Code Stroke CTA Neck W/WO contrast  Result Date: 06/06/2019 CLINICAL DATA:  Code stroke. Fell and hit head. Intracranial hemorrhage. Slurred speech and right-sided weakness. EXAM: CT ANGIOGRAPHY HEAD AND NECK CT PERFUSION BRAIN TECHNIQUE: Multidetector CT imaging of the head and neck was performed using the standard protocol during bolus administration of intravenous contrast. Multiplanar CT image reconstructions and MIPs were obtained to evaluate the vascular anatomy. Carotid stenosis measurements (when applicable) are obtained utilizing NASCET criteria, using the distal internal carotid diameter as the denominator. Multiphase CT imaging of the brain was performed following IV bolus contrast injection. Subsequent parametric perfusion maps were calculated using RAPID software. CONTRAST:  100 mL Omnipaque 350 IV COMPARISON:  CT head approximately 2 hours prior FINDINGS: CT HEAD FINDINGS Brain: Acute extra-axial hemorrhage left posterior frontal convexity appears unchanged. This is a combination of subdural para falcine blood as well as adjacent subarachnoid hemorrhage. The largest area of hemorrhage measures approximately 25 x 15 mm. Ventricle size normal. No acute ischemic infarct. Negative for hemorrhage or mass. Vascular: Negative for hyperdense vessel. Arterial calcification diffusely Skull: Negative for skull fracture Sinuses/Orbits: Mucosal edema paranasal sinuses. Contracted right maxillary sinus. Bilateral cataract surgery.  No orbital mass. Other: None ASPECTS ([place] Stroke Program Early CT Score) Not applicable due to intracranial hemorrhage. CTA NECK FINDINGS Aortic arch: Standard branching. Imaged portion shows no evidence of aneurysm or dissection. No significant stenosis of the major arch vessel origins. Atherosclerotic disease in the aortic arch and proximal great vessels. Pacemaker noted. Right carotid system: Atherosclerotic disease right carotid bifurcation. Approximately 50% diameter  stenosis proximal right internal carotid artery. Mild atherosclerotic disease right common carotid artery. Left carotid system: Atherosclerotic disease left carotid bifurcation without significant stenosis. Vertebral arteries: Both vertebral arteries patent to the basilar without significant stenosis. Skeleton: Degenerative changes cervical spine. No acute skeletal abnormality. Other neck: Negative Upper chest: Lung apices clear bilaterally.  Left-sided pacemaker. Review of the MIP images confirms the above findings CTA HEAD FINDINGS Anterior circulation: Atherosclerotic calcification in the cavernous carotid bilaterally causing mild stenosis. Right middle cerebral artery widely patent. Both anterior cerebral arteries widely patent. Left M1 widely patent. There is a branch occlusion left M3. There is flow past the short segment occlusion with decreased perfusion in this area. Probable additional distal emboli past the occlusion. This corresponds to an area of ischemia on CT perfusion. Posterior circulation: Mild atherosclerotic stenosis of the distal vertebral artery bilaterally. Right PICA patent. Left PICA not visualized. AICA patent bilaterally. Mild atherosclerotic disease in the basilar. Superior cerebellar and posterior cerebral arteries patent bilaterally. Fetal origin right posterior cerebral artery. Venous sinuses: Patent Anatomic variants: None Review of the MIP images confirms the above findings CT Brain Perfusion Findings: ASPECTS: Not scored due to intracranial hemorrhage CBF (<30%) Volume: 1mL Perfusion (Tmax>6.0s) volume: 71mL Mismatch Volume: 30mL Infarction Location:Left parietal lobe IMPRESSION: 1. Left convexity hemorrhage unchanged from earlier today. This appears to be a combination of left parafalcine subdural hematoma and subarachnoid hemorrhage. This is related to trauma given history of fall. No acute ischemic infarct on CT. 2. 50% diameter stenosis proximal right internal carotid artery.  Left carotid artery patent without significant stenosis. Both vertebral arteries patent in the neck. 3. Segmental occlusion left M3 branch with trickle distal flow and additional clot distally. 4. CT perfusion demonstrates 9 mL  of core infarct and 81 mL of additional ischemia in the left parietal lobe corresponding to the left M3 branch occlusion. 5. These results were called by telephone at the time of interpretation on 06/06/2019 at 6:44 pm to provider ERIC [facility] , who verbally acknowledged these results. Electronically Signed   By: Franchot Gallo M.D.   On: 06/06/2019 18:45   CT Code Stroke Cerebral Perfusion with contrast  Result Date: 06/06/2019 CLINICAL DATA:  Stroke EXAM: CT PERFUSION BRAIN TECHNIQUE: Multiphase CT imaging of the brain was performed following IV bolus contrast injection. Subsequent parametric perfusion maps were calculated using RAPID software. CONTRAST:  112mL OMNIPAQUE IOHEXOL 350 MG/ML SOLN COMPARISON:  CTA head 06/06/2019 FINDINGS: CT Brain Perfusion Findings: CBF (<30%) Volume: 6mL Perfusion (Tmax>6.0s) volume: 47mL Mismatch Volume: 48mL ASPECTS on noncontrast CT Head: Aspect score not calculated due to intracranial hemorrhage. Infarct Core: 9 mL Infarction Location:Left parietal lobe IMPRESSION: 9 mL of acute infarction left parietal lobe with 81 mL of surrounding penumbra. Electronically Signed   By: Franchot Gallo M.D.   On: 06/06/2019 19:10   DG Chest Port 1 View  Result Date: 06/07/2019 CLINICAL DATA:  CVA. EXAM: PORTABLE CHEST 1 VIEW COMPARISON:  06/02/2019 FINDINGS: The pacer wires/AICD are stable. Stable mild cardiac enlargement. Diffuse slightly nodular interstitial process in the lungs could suggest interstitial edema or interstitial pneumonitis. No focal infiltrates or effusions. IMPRESSION: Diffuse slightly nodular interstitial process in the lungs could suggest interstitial edema or interstitial pneumonitis. Electronically Signed   By: Marijo Sanes M.D.   On:  06/07/2019 08:32   DG Swallowing Func-Speech Pathology  Result Date: 06/07/2019 Objective Swallowing Evaluation: Type of Study: MBS-Modified Barium Swallow Study  Patient Details Name: POOKELA BOCKENSTEDT MRN: [id number] Date of Birth: 1940-06-15 Today's Date: 06/07/2019 Time: SLP Start Time (ACUTE ONLY): 1200 -SLP Stop Time (ACUTE ONLY): 1230 SLP Time Calculation (min) (ACUTE ONLY): 30 min Past Medical History: Past Medical History: Diagnosis Date . AICD (automatic cardioverter/defibrillator) present 01/17/2003  Medtronic Maximo 7232CX ICD, serial [id number] S . Anemia 02-06-11  takes oral iron . Arthritis   hands, knees . CAD (coronary artery disease) 2003  a. h/o MI and CABG in 2003. b. s/p DES to SVG-RPDA-RPLB in 08/2014. Marland Kitchen Cancer of sigmoid colon ([place]) 2012  a. s/p colon surgery. . Carotid bruit  . Chronic systolic CHF (congestive heart failure) (HCC)   a. EF 20% in 2014; b. 08/2017 Echo: EF 20-25%, diff HK, Gr3 DD. Triv AI. Mod MR. Sev dil LA. Mildly dil RV w/ mildly reduced RV fxn. Mildly dil RA. Mod TR. PASP 76mHg. Marland Kitchen Cough  . GERD (gastroesophageal reflux disease) 02-06-11 . HTN (hypertension)  . Hyperlipidemia  . Ischemic cardiomyopathy   a. EF 20% in 2014. (Master study EF >20%); b. 08/2017 Echo: EF 20-25%, diff HK. Gr3 DD. . LV (left ventricular) mural thrombus   a. 12/2012 Echo: EF 20% with mural thrombus No evidence of thrombus on 08/2017 echo. . Macular degeneration  . Myocardial infarct ([place])   2003 . PAF (paroxysmal atrial fibrillation) (HCC)   a. CHA2DS2VASc = 5-->Xarelto/Tikosyn. Past Surgical History: Past Surgical History: Procedure Laterality Date . CARDIAC CATHETERIZATION N/A 09/20/2014  Procedure: Left Heart Cath and Coronary Angiography;  Surgeon: Jettie Booze, MD; LAD 95%, D1 100%, CFX liner percent, OM 200%, OM 390%, RCA 90%, LIMA-LAD okay, SVG-OM 2-OM 3 minimal disease, SVG-RPDA-RPLB 100% between the RPDA and RPL    . CARDIAC CATHETERIZATION N/A 09/20/2014  Procedure: Coronary Stent  Intervention;  Surgeon: Conception Oms  Hassell Done, MD; Synergy DES 4 x 24 mm reducing the stenosis to 5%  . CARDIAC DEFIBRILLATOR PLACEMENT  01/17/03  6949 lead. Medtronic. remote-no; with later revision . CARDIOVERSION N/A 05/22/2016  Procedure: CARDIOVERSION;  Surgeon: Lelon Perla, MD;  Location: [facility] ENDOSCOPY;  Service: Cardiovascular;  Laterality: N/A; . CARDIOVERSION N/A 08/10/2017  Procedure: CARDIOVERSION;  Surgeon: Pixie Casino, MD;  Location: [facility] ENDOSCOPY;  Service: Cardiovascular;  Laterality: N/A; . CATARACT EXTRACTION W/ INTRAOCULAR LENS  IMPLANT, BILATERAL Bilateral June/-July 2009  Dr. Katy Fitch . COLON RESECTION  02/09/2011  Procedure: LAPAROSCOPIC SIGMOID COLON RESECTION;  Surgeon: Pedro Earls, MD;  Location: [facility] ORS;  Service: General;  Laterality: N/A;  Laparoscopic Assisted Sigmoid Colectomy . COLON SURGERY   . COLONOSCOPY  08/31/2011  Procedure: COLONOSCOPY;  Surgeon: Jerene Bears, MD;  Location: [facility] ENDOSCOPY;  Service: Gastroenterology;  Laterality: N/A; . COLONOSCOPY N/A 09/05/2012  Procedure: COLONOSCOPY;  Surgeon: Jerene Bears, MD;  Location: [facility] ENDOSCOPY;  Service: Gastroenterology;  Laterality: N/A; . COLONOSCOPY N/A 04/18/2013  Procedure: COLONOSCOPY;  Surgeon: Jerene Bears, MD;  Location: [facility] ENDOSCOPY;  Service: Gastroenterology;  Laterality: N/A; . COLONOSCOPY N/A 04/09/2014  Procedure: COLONOSCOPY;  Surgeon: Jerene Bears, MD;  Location: [facility] ENDOSCOPY;  Service: Gastroenterology;  Laterality: N/A; . COLONOSCOPY WITH PROPOFOL N/A 05/03/2017  Procedure: COLONOSCOPY WITH PROPOFOL;  Surgeon: Jerene Bears, MD;  Location: [facility] ENDOSCOPY;  Service: Gastroenterology;  Laterality: N/A; . CORONARY ANGIOPLASTY   . CORONARY ARTERY BYPASS GRAFT  01/2002  LIMA-LAD, SVG-OM 2-OM 3, SVG-RPDA-RPLB . CORONARY STENT INTERVENTION N/A 11/22/2018  Procedure: CORONARY STENT INTERVENTION;  Surgeon: Nelva Bush, MD;  Location: [place] CV LAB;  Service: Cardiovascular;  Laterality: N/A;  SVG - RCA . ICD GENERATOR  CHANGE  2010  Medtronic Virtuoso II VR ICD . ICD GENERATOR CHANGEOUT N/A 12/07/2018  Procedure: ICD GENERATOR CHANGEOUT;  Surgeon: Deboraha Sprang, MD;  Location: [place] [facility];  Service: Cardiovascular;  Laterality: N/A; . INGUINAL HERNIA REPAIR Right 2000's X 2 . LAPAROSCOPIC RIGHT HEMI COLECTOMY N/A 11/04/2012  Procedure: LAPAROSCOPIC RIGHT HEMI COLECTOMY;  Surgeon: Pedro Earls, MD;  Location: [facility] ORS;  Service: General;  Laterality: N/A; . LEFT HEART CATH AND CORS/GRAFTS ANGIOGRAPHY Left 11/22/2018  Procedure: LEFT HEART CATH AND CORS/GRAFTS ANGIOGRAPHY;  Surgeon: Nelva Bush, MD;  Location: [place] [facility];  Service: Cardiovascular;  Laterality: Left; . TONSILLECTOMY  ~ 1950 HPI: Mark Clayton is a 79 y.o. male who was admitted 3/9 with traumatic ICH. PMH including but not limited to PAF on xarelto, MI, LV thrombus, ICM, HTN, HLD, CAD, CA of sigmoid colon s/p surgery (see "past medical history" for rest). CT (3/9) revealing a combination of left parafalcine subdural hematoma and subarachnoid hemorrhage. This is related to trauma given history of fall. No acute ischemic infarct on CT. Presents with Expressive > receptive apahsia. H/o GERD ad coughing.  Subjective: cooperative Assessment / Plan / Recommendation CHL IP CLINICAL IMPRESSIONS 06/07/2019 Clinical Impression Pt presents with oropharyngeal dysphagia across consistencies. Oral phase is remarkable for lingual residue. Pharyngeal phase is remarkable for reduced BOT retraction and epiglottic inversion resulting in vallecular and pyriform sinsus residue. Vallecular residue significantly increased with progression of advanced textures, and multiple strategies were attempted to reduce the amount, with the effortful swallow being the most effective. Trace penetration (PAS 3) occured x2 as the pt also utilized a liquid wash to reduce vallecular residue following advanced textures, however, it was not observed following any other textures. There  was  frequent coughing and throat clearing as seen at bedside, but was not related to aspiration. Given the penetration and amount of residue with advanced textures, recommend Dys 2 and thin liquids, utilizing liquid wash PRN, multiple hard swallows following each bite/sip, clearing throat intermittently. SLP Visit Diagnosis Dysphagia, oropharyngeal phase (R13.12) Attention and concentration deficit following -- Frontal lobe and executive function deficit following -- Impact on safety and function --   CHL IP TREATMENT RECOMMENDATION 06/07/2019 Treatment Recommendations Therapy as outlined in treatment plan below   Prognosis 06/07/2019 Prognosis for Safe Diet Advancement Good Barriers to Reach Goals -- Barriers/Prognosis Comment -- CHL IP DIET RECOMMENDATION 06/07/2019 SLP Diet Recommendations Dysphagia 2 (Fine chop) solids;Thin liquid Liquid Administration via Cup;Straw;Spoon Medication Administration Crushed with puree Compensations Minimize environmental distractions;Slow rate;Small sips/bites;Multiple dry swallows after each bite/sip;Follow solids with liquid;Effortful swallow Postural Changes Seated upright at 90 degrees;Remain semi-upright after after feeds/meals (Comment)   CHL IP OTHER RECOMMENDATIONS 06/07/2019 Recommended Consults -- Oral Care Recommendations Oral care BID Other Recommendations --   CHL IP FOLLOW UP RECOMMENDATIONS 06/07/2019 Follow up Recommendations Other (comment)   CHL IP FREQUENCY AND DURATION 06/07/2019 Speech Therapy Frequency (ACUTE ONLY) min 2x/week Treatment Duration 2 weeks      CHL IP ORAL PHASE 06/07/2019 Oral Phase Impaired Oral - Pudding Teaspoon -- Oral - Pudding Cup -- Oral - Honey Teaspoon -- Oral - Honey Cup -- Oral - Nectar Teaspoon -- Oral - Nectar Cup Lingual/palatal residue Oral - Nectar Straw -- Oral - Thin Teaspoon -- Oral - Thin Cup WFL Oral - Thin Straw WFL Oral - Puree WFL Oral - Mech Soft -- Oral - Regular Lingual/palatal residue Oral - Multi-Consistency -- Oral - Pill --  Oral Phase - Comment --  CHL IP PHARYNGEAL PHASE 06/07/2019 Pharyngeal Phase Impaired Pharyngeal- Pudding Teaspoon -- Pharyngeal -- Pharyngeal- Pudding Cup -- Pharyngeal -- Pharyngeal- Honey Teaspoon -- Pharyngeal -- Pharyngeal- Honey Cup -- Pharyngeal -- Pharyngeal- Nectar Teaspoon -- Pharyngeal -- Pharyngeal- Nectar Cup Pharyngeal residue - valleculae;Pharyngeal residue - pyriform;Reduced tongue base retraction;Compensatory strategies attempted (with notebox) Pharyngeal -- Pharyngeal- Nectar Straw -- Pharyngeal -- Pharyngeal- Thin Teaspoon -- Pharyngeal -- Pharyngeal- Thin Cup Pharyngeal residue - valleculae;Pharyngeal residue - pyriform;Reduced tongue base retraction;Penetration/Aspiration during swallow Pharyngeal Material enters airway, remains ABOVE vocal cords then ejected out Pharyngeal- Thin Straw Pharyngeal residue - valleculae;Pharyngeal residue - pyriform;Reduced tongue base retraction Pharyngeal -- Pharyngeal- Puree Pharyngeal residue - pyriform;Pharyngeal residue - valleculae;Reduced tongue base retraction Pharyngeal -- Pharyngeal- Mechanical Soft -- Pharyngeal -- Pharyngeal- Regular Pharyngeal residue - valleculae;Pharyngeal residue - pyriform;Reduced tongue base retraction Pharyngeal -- Pharyngeal- Multi-consistency -- Pharyngeal -- Pharyngeal- Pill -- Pharyngeal -- Pharyngeal Comment --  CHL IP CERVICAL ESOPHAGEAL PHASE 06/07/2019 Cervical Esophageal Phase WFL Pudding Teaspoon -- Pudding Cup -- Honey Teaspoon -- Honey Cup -- Nectar Teaspoon -- Nectar Cup -- Nectar Straw -- Thin Teaspoon -- Thin Cup -- Thin Straw -- Puree -- Mechanical Soft -- Regular -- Multi-consistency -- Pill -- Cervical Esophageal Comment -- Osie Bond., M.A. [place] [facility] Pager [phone] Office [phone] 06/07/2019, 2:23 PM              ECHOCARDIOGRAM COMPLETE  Result Date: 06/07/2019    ECHOCARDIOGRAM REPORT   Patient Name:   Mark Clayton Date of Exam: 06/07/2019 Medical Rec #:  [id number]       Height:       72.0 in Accession #:    [id number]     Weight:  159.8 lb Date of Birth:  22-Aug-1940      BSA:          1.937 m Patient Age:    80 years       BP:           139/85 mmHg Patient Gender: M              HR:           91 bpm. Exam Location:  Inpatient Procedure: 2D Echo and Intracardiac Opacification Agent Indications:    Stroke 434.91 / I163.9  History:        Patient has prior history of Echocardiogram examinations, most                 recent 09/03/2017. CAD and Previous Myocardial Infarction,                 Defibrillator, Arrythmias:Atrial Fibrillation,                 Signs/Symptoms:Chest Pain; Risk Factors:Hypertension. Ischemic                 cardiomyopathy.  Sonographer:    Darlina Sicilian RDCS Referring Phys: [id number] [place]  1. No mural thrombus with Definity contrast. Left ventricular ejection fraction, by estimation, is <20%. The left ventricle has severely decreased function. The left ventricle demonstrates global hypokinesis with regional variation. The left ventricular  internal cavity size was moderately to severely dilated.  2. Right ventricular systolic function is moderately reduced. The right ventricular size is mildly enlarged. A AICD wire is visualized. There is moderately elevated pulmonary artery systolic pressure.  3. Left atrial size was moderately dilated.  4. The mitral valve is abnormal. Mild mitral valve regurgitation.  5. The aortic valve is tricuspid. Aortic valve regurgitation is mild. Mild aortic valve sclerosis is present, with no evidence of aortic valve stenosis.  6. The inferior vena cava is dilated in size with <50% respiratory variability, suggesting right atrial pressure of 15 mmHg. Comparison(s): Changes from prior study are noted. 09/03/2017: LVEF 20-25%. FINDINGS  Left Ventricle: No mural thrombus with Definity contrast. Left ventricular ejection fraction, by estimation, is <20%. The left ventricle has severely decreased function. The left  ventricle demonstrates global hypokinesis. Definity contrast agent was given IV to delineate the left ventricular endocardial borders. The left ventricular internal cavity size was moderately to severely dilated. There is no left ventricular hypertrophy. Left ventricular diastolic parameters are indeterminate. Right Ventricle: The right ventricular size is mildly enlarged. No increase in right ventricular wall thickness. Right ventricular systolic function is moderately reduced. There is moderately elevated pulmonary artery systolic pressure. The tricuspid regurgitant velocity is 2.82 m/s, and with an assumed right atrial pressure of 15 mmHg, the estimated right ventricular systolic pressure is [id number]. Left Atrium: Left atrial size was moderately dilated. Right Atrium: Right atrial size was normal in size. Pericardium: There is no evidence of pericardial effusion. Mitral Valve: The mitral valve is abnormal. There is mild thickening of the mitral valve leaflet(s). Mild mitral valve regurgitation. Tricuspid Valve: The tricuspid valve is grossly normal. Tricuspid valve regurgitation is mild. Aortic Valve: The aortic valve is tricuspid. Aortic valve regurgitation is mild. Mild aortic valve sclerosis is present, with no evidence of aortic valve stenosis. Pulmonic Valve: The pulmonic valve was grossly normal. Pulmonic valve regurgitation is trivial. Aorta: The aortic root, ascending aorta, aortic arch and descending aorta are all structurally normal, with no evidence of dilitation or obstruction.  Venous: The inferior vena cava is dilated in size with less than 50% respiratory variability, suggesting right atrial pressure of 15 mmHg. IAS/Shunts: No atrial level shunt detected by color flow Doppler. Additional Comments: A AICD wire is visualized.  LEFT VENTRICLE PLAX 2D LVIDd:         6.93 cm LVIDs:         6.15 cm LV PW:         1.01 cm LV IVS:        1.06 cm LVOT diam:     1.90 cm LV SV:         32 LV SV Index:   17  LVOT Area:     2.84 cm  LV Volumes (MOD) LV vol d, MOD A2C: 268.0 ml LV vol d, MOD A4C: 307.5 ml LV vol s, MOD A2C: 210.0 ml LV vol s, MOD A4C: 223.0 ml LV SV MOD A2C:     58.0 ml LV SV MOD A4C:     307.5 ml LV SV MOD BP:      67.7 ml RIGHT VENTRICLE RV S prime:     7.67 cm/s LEFT ATRIUM             Index       RIGHT ATRIUM           Index LA diam:        5.30 cm 2.74 cm/m  RA Area:     24.40 cm LA Vol (A2C):   65.4 ml 33.76 ml/m RA Volume:   77.10 ml  39.81 ml/m LA Vol (A4C):   84.8 ml 43.78 ml/m LA Biplane Vol: 77.2 ml 39.86 ml/m  AORTIC VALVE LVOT Vmax:   83.30 cm/s LVOT Vmean:  48.200 cm/s LVOT VTI:    0.113 m  AORTA Ao Root diam: 3.40 cm Ao Asc diam:  3.50 cm MITRAL VALVE                TRICUSPID VALVE MV Area (PHT): 5.68 cm     TR Peak grad:   31.8 mmHg MV Decel Time: 134 msec     TR Vmax:        282.00 cm/s MV E velocity: 125.50 cm/s                             SHUNTS                             Systemic VTI:  0.11 m                             Systemic Diam: 1.90 cm Lyman Bishop MD Electronically signed by Lyman Bishop MD Signature Date/Time: 06/07/2019/4:27:13 PM    Final    CT HEAD CODE STROKE WO CONTRAST  Result Date: 06/06/2019 CLINICAL DATA:  Code stroke. Fell and hit head with right-sided weakness and slurred speech. EXAM: CT HEAD WITHOUT CONTRAST TECHNIQUE: Contiguous axial images were obtained from the base of the skull through the vertex without intravenous contrast. COMPARISON:  07/03/2013 FINDINGS: There is motion artifact through the skull base. Brain: There is a focal collection of acute extra-axial hemorrhage in the left interhemispheric fissure at the vertex measuring 3.3 x 1.9 cm which may be subdural or subarachnoid. Additional small volume subdural or subarachnoid hemorrhage is also present more inferiorly and anteriorly in the  interhemispheric fissure to the left of midline. Scattered small areas of hyperattenuation along the right greater than left anterior frontal lobes may  represent artifact versus a small amount of additional subarachnoid hemorrhage. No acute infarct or midline shift is evident. There is mild cerebral atrophy. Vascular: Calcified atherosclerosis at the skull base. No hyperdense vessel. Skull: No fracture or suspicious osseous lesion. Sinuses/Orbits: Small right maxillary sinus. Clear paranasal sinuses and mastoid air cells. Bilateral cataract extraction. Other: Mild posterior scalp swelling/small hematoma. ASPECTS [facility] Stroke Program Early CT Score) Not scored due to the presence of hemorrhage. IMPRESSION: 1. Extra-axial hemorrhage in the left interhemispheric fissure as above. No significant mass effect. 2. No acute infarct identified. These results were communicated to Dr. Cheral Marker at 4:02 pm on 06/06/2019 by text page via the [facility] messaging system. Electronically Signed   By: Logan Bores M.D.   On: 06/06/2019 16:04    Assessment/Plan: Diagnosis: Left parafalcine subdural hematoma and subarachnoid hemorrhage 1. Does the need for close, 24 hr/day medical supervision in concert with the patient's rehab needs make it unreasonable for this patient to be served in a less intensive setting? Yes 2. Co-Morbidities requiring supervision/potential complications: PAF on Xarelto, MI, LV thrombus, ICM, HTN, HLD, CAD, colon cancer s/p surgery, dysphagia 3. Due to bladder management, bowel management, safety, skin/wound care, disease management, medication administration, pain management and patient education, does the patient require 24 hr/day rehab nursing? Yes 4. Does the patient require coordinated care of a physician, rehab nurse, therapy disciplines of PT, OT, SLP to address physical and functional deficits in the context of the above medical diagnosis(es)? Yes Addressing deficits in the following areas: balance, endurance, locomotion, strength, transferring, bowel/bladder control, bathing, dressing, feeding, grooming, toileting, cognition, speech, language,  swallowing and psychosocial support 5. Can the patient actively participate in an intensive therapy program of at least 3 hrs of therapy per day at least 5 days per week? Yes 6. The potential for patient to make measurable gains while on inpatient rehab is excellent 7. Anticipated functional outcomes upon discharge from inpatient rehab are min assist  with PT, min assist with OT, supervision with SLP. 8. Estimated rehab length of stay to reach the above functional goals is: 2-3 weeks 9. Anticipated discharge destination: Home 10. Overall Rehab/Functional Prognosis: excellent  RECOMMENDATIONS: This patient's condition is appropriate for continued rehabilitative care in the following setting: CIR Patient has agreed to participate in recommended program. Yes Note that insurance prior authorization may be required for reimbursement for recommended care.  Comment: Mr. Vizzini would be an excellent CIR candidate. He has excellent support from multiple family members and friends.  Please consider Trazodone HS to help with insomnia and Claritin daily to help with allergies. Thank you for this consult.   Bary Leriche, PA-C 06/08/2019   I have personally performed a face to face diagnostic evaluation, including, but not limited to relevant history and physical exam findings, of this patient and developed relevant assessment and plan.  Additionally, I have reviewed and concur with the physician assistant's documentation above.  Leeroy Cha, MD

## 2019-06-08 NOTE — Progress Notes (Addendum)
  Speech Language Pathology Treatment: Dysphagia;Cognitive-Linquistic  Patient Details Name: Mark Clayton MRN: [id number] DOB: 08-08-1940 Today's Date: 06/08/2019 Time: 1203-1222 SLP Time Calculation (min) (ACUTE ONLY): 19 min  Assessment / Plan / Recommendation Clinical Impression  Pt encountered sitting upright in bed. Patient alert, cooperative, some confusion noted. Pt provided eduation re: his participation in and the results of MBSS completed yesterday. Patient reports he does not recall his participation in this assessment. Patient's sister ("she lives just down the hill") entered room at the end of the session, ST provided verbal edu re: MBSS showing paient with vallecular and pyriform sinus residue and resultant recommendations for current diet texture (dysphagia 2) and recommendations for small sips/bites, alternating liquids and solids, clearing throat intermittently. Pt with limited verbal understanding, sister verbalized understanding.   Patient seen with cup and straw sips of thin liquids. Pt noted to take quite large cup sips in 2 instances, these resulted in delayed coughing. When cued to take small sips, pt with no overt s/sx aspiration or distress. Pt with mild R anterior labial spillage with cup sips. Pt seen with puree solids (magic cup): no overt s/sx aspiration. Pt provided mod cues to alternate bites of puree with sips of liquids. Pt needed continual verbal cues to use effortful swallow. He will benefit from continued treatment to address carryover of strategies.   Patient's speech intelligibility much improved this date. He is approximately 90% intelligible at the sentence level. Patient able to name 6/8 items in confrontation naming task accurately, he demonstrated some phonemic paraphasias x2. In informal conversation, patient with some jargon present, though islands of clear, fluent speech evident in many instances. Patient benefits from use of gestures to target reduction of  communication breakdowns. Patient will benefit from continued ST as per POC.   HPI HPI: Mark Clayton is a 79 y.o. male who was admitted 3/9 with traumatic ICH. PMH including but not limited to PAF on xarelto, MI, LV thrombus, ICM, HTN, HLD, CAD, CA of sigmoid colon s/p surgery (see "past medical history" for rest). CT (3/9) revealing a combination of left parafalcine subdural hematoma and subarachnoid hemorrhage. This is related to trauma given history of fall. No acute ischemic infarct on CT. Presents with Expressive > receptive apahsia. H/o GERD ad coughing. CT 3/10: left parietal lobe ischemic stroke.       SLP Plan  Continue with current plan of care       Recommendations  Diet recommendations: Dysphagia 2 (fine chop) Medication Administration: Crushed with puree Compensations: Minimize environmental distractions;Slow rate;Small sips/bites;Multiple dry swallows after each bite/sip;Follow solids with liquid;Effortful swallow Postural Changes and/or Swallow Maneuvers: Seated upright 90 degrees                Oral Care Recommendations: Oral care BID;Staff/trained caregiver to provide oral care Follow up Recommendations: Inpatient Rehab;24 hour supervision/assistance SLP Visit Diagnosis: Dysphagia, oropharyngeal phase (R13.12);Aphasia (R47.01) Plan: Continue with current plan of care       [place] 06/08/2019, 1:27 PM  Marina Goodell, M.Ed., [facility] [phone]: Acute Rehab office [phone] - pager

## 2019-06-08 NOTE — Progress Notes (Signed)
PROGRESS NOTE    OVILA WANGER  [id number] DOB: [birth year] DOA: 06/06/2019 PCP: [name], MD    Brief Narrative:  [phone] with hx PAF on xarelto, MI, LV thrombus, ICM, HTN, HLD, CAD, sigmoid colon CA s/p surgery presented after falling backwards and hitting his head on the concrete, found to have acute extra-axial hemorrhage on L on CT head. Patient was initially admitted to ICU and received KCentra to reverse xarelto. Neurosurgery followed with recommendation for no surgical intervention. During workup, patient was found to have acute L MCA infarct. Stroke team consulted. Patient since transferred to hospitalist service 3/10.   Assessment & Plan:   Active Problems:   ICH (intracerebral hemorrhage) ([place])   1. ICH 1. Post-traumatic after fall and hitting concrete 2. Had been followed by Neurosurgery with no recommendation for surgical intervention, since signing off as of 3/11 3. Anticoagulation remains on hold 2. Acute L MCA CVA 1. Stroke team following 2. Unable to perform MRI secondary to AICD not compatible with MRI 3. Cont to hold anticoagulation per above 4. LDL 60 with A1c of 6.1 5. Defer resumption of anticoagulation to Neurology 6. Therapy recommending CIR, seen by CIR staff with insurance authorization now in process 3. CAD 1. Denies chest pain or sob this AM 2. S/p prior CABG and ICD placement 3. Cath on 8/25 with findings of severe native CAD noted s/p PCI 4. Currently off anticoagulation and antiplatelet per above 4. PAF 1. Currently rate controlled 2. Continued on coreg 3. Anticoagulation when OK with Neurology 5. HTN 1. BP stable at present 2. Cont current regimen and titrate as tolerated 6. HLD 1. LD of 60 2. Continued on statin 7. Hx colon cancer 1. S/p sigmoidectomy as well as later R hemicolectomy in 8/14 2. Followed by GI Velora Heckler) as well as General Surgery 3. Chart reviewed, noted to be extremely high risk for future colon polyps 8. Hx  LV thrombus 1. Seems stable currently 2. Per above, plan anticoagulation when OK with Neurology  DVT prophylaxis: SCD's Code Status: Full Family Communication: Pt in room, family not at bedside Disposition Plan: From home, plan CIR when insurance authorization clears  Consultants:   PCCM  Neurosurgery  Neurology  CIR  Procedures:     Antimicrobials: Anti-infectives (From admission, onward)   None       Subjective: Still having much L sided weakness this AM  Objective: Vitals:   06/08/19 0436 06/08/19 0756 06/08/19 1130 06/08/19 1636  BP:  (!) 154/92 (!) 138/105 (!) 142/93  Pulse:  98 81 95  Resp:  20 18 16   Temp:  98.2 F (36.8 C) 98.1 F (36.7 C) 98.4 F (36.9 C)  TempSrc:  Oral Oral Oral  SpO2:  91% 97% 95%  Weight: 77.9 kg       Intake/Output Summary (Last 24 hours) at 06/08/2019 1641 Last data filed at 06/08/2019 1006 Gross per 24 hour  Intake 344.78 ml  Output --  Net 344.78 ml   Filed Weights   06/06/19 2011 06/08/19 0436  Weight: 72.5 kg 77.9 kg    Examination:  General exam: Appears calm and comfortable  Respiratory system: Clear to auscultation. Respiratory effort normal. Cardiovascular system: S1 & S2 heard, Regular Gastrointestinal system: Abdomen is nondistended, soft and nontender. No organomegaly or masses felt. Normal bowel sounds heard. Central nervous system: Alert and oriented. L sided weakness with facial droop Extremities: Symmetric 5 x 5 power. Skin: No rashes, lesions Psychiatry: Judgement and insight appear normal.  Mood & affect appropriate.   Data Reviewed: I have personally reviewed following labs and imaging studies  CBC: Recent Labs  Lab 06/02/19 1917 06/06/19 1549 06/06/19 1551 06/07/19 0540 06/08/19 0509  WBC 6.2 5.9  --  9.8 8.3  NEUTROABS 3.6 3.5  --   --   --   HGB 11.7* 12.2* 12.2* 11.6* 12.0*  HCT 36.4* 37.6* 36.0* 35.4* 36.6*  MCV 86 88.3  --  86.8 88.4  PLT 170 176  --  177 [id number]   Basic Metabolic  Panel: Recent Labs  Lab 06/02/19 1917 06/06/19 1549 06/06/19 1551 06/07/19 0540 06/08/19 0509  NA 145* 141 142 144 141  K 3.6 3.6 3.5 3.1* 3.4*  CL 105 107 106 111 108  CO2 27 25  --  23 23  GLUCOSE 97 133* 124* 125* 134*  BUN 12 13 12 10 14   CREATININE 1.10 1.27* 1.20 1.01 1.20  CALCIUM 9.0 9.1  --  8.9 8.9  MG  --   --   --  1.9 2.2  PHOS  --   --   --  1.8* 3.0   GFR: Estimated Creatinine Clearance: 55.7 mL/min (by C-G formula based on SCr of 1.2 mg/dL). Liver Function Tests: Recent Labs  Lab 06/06/19 1549  AST 17  ALT 11  ALKPHOS 49  BILITOT 1.2  PROT 6.2*  ALBUMIN 3.6   No results for input(s): LIPASE, AMYLASE in the last 168 hours. No results for input(s): AMMONIA in the last 168 hours. Coagulation Profile: Recent Labs  Lab 06/06/19 1549  INR 1.1   Cardiac Enzymes: No results for input(s): CKTOTAL, CKMB, CKMBINDEX, TROPONINI in the last 168 hours. BNP (last 3 results) Recent Labs    08/26/18 0856 03/13/19 1028  PROBNP 382.0* 410.0*   HbA1C: Recent Labs    06/07/19 0540  HGBA1C 6.1*   CBG: Recent Labs  Lab 06/06/19 1542  GLUCAP 119*   Lipid Profile: Recent Labs    06/07/19 0540  CHOL 121  HDL 39*  LDLCALC 60  TRIG 112  CHOLHDL 3.1   Thyroid Function Tests: No results for input(s): TSH, T4TOTAL, FREET4, T3FREE, THYROIDAB in the last 72 hours. Anemia Panel: No results for input(s): VITAMINB12, FOLATE, FERRITIN, TIBC, IRON, RETICCTPCT in the last 72 hours. Sepsis Labs: No results for input(s): PROCALCITON, LATICACIDVEN in the last 168 hours.  Recent Results (from the past 240 hour(s))  Novel Coronavirus, NAA ([facility])     Status: None   Collection Time: 06/02/19  7:17 PM   Specimen: Nasopharyngeal(NP) swabs in vial transport medium   NASOPHARYNGE  IS THIS  Result Value Ref Range Status   SARS-CoV-2, NAA Not Detected Not Detected Final    Comment: Testing was performed using the cobas(R) SARS-CoV-2 test. This nucleic acid  amplification test was developed and its performance characteristics determined by [employer], [employer] and [employer]. Nucleic acid amplification tests include RT-PCR and TMA. This test has not been FDA cleared or approved. This test has been authorized by FDA under an Emergency Use Authorization (EUA). This test is only authorized for the duration of time the declaration that circumstances exist justifying the authorization of the emergency use of in vitro diagnostic tests for detection of SARS-CoV-2 virus and/or diagnosis of COVID-19 infection under section 564(b)(1) of the Act, 21 U.S.C. [id number]) (1), unless the authorization is terminated or revoked sooner. When diagnostic testing is negative, the possibility of a false negative result should be considered in the context of a patient's recent exposures and the  presence of clinical signs and symptoms consistent with COVID-19. An individual without sympto ms of COVID-19 and who is not shedding SARS-CoV-2 virus would expect to have a negative (not detected) result in this assay.   Respiratory Panel by RT PCR (Flu A&B, Covid) - Nasopharyngeal Swab     Status: None   Collection Time: 06/06/19  4:05 PM   Specimen: Nasopharyngeal Swab  Result Value Ref Range Status   SARS Coronavirus 2 by RT PCR NEGATIVE NEGATIVE Final    Comment: (NOTE) SARS-CoV-2 target nucleic acids are NOT DETECTED. The SARS-CoV-2 RNA is generally detectable in upper respiratoy specimens during the acute phase of infection. The lowest concentration of SARS-CoV-2 viral copies this assay can detect is 131 copies/mL. A negative result does not preclude SARS-Cov-2 infection and should not be used as the sole basis for treatment or other patient management decisions. A negative result may occur with  improper specimen collection/handling, submission of specimen other than nasopharyngeal swab, presence of viral mutation(s) within the areas targeted by this assay, and inadequate  number of viral copies (<131 copies/mL). A negative result must be combined with clinical observations, patient history, and epidemiological information. The expected result is Negative. Fact Sheet for Patients:  PinkCheek.be Fact Sheet for Healthcare Providers:  GravelBags.it This test is not yet ap proved or cleared by the [place] FDA and  has been authorized for detection and/or diagnosis of SARS-CoV-2 by FDA under an Emergency Use Authorization (EUA). This EUA will remain  in effect (meaning this test can be used) for the duration of the COVID-19 declaration under Section 564(b)(1) of the Act, 21 U.S.C. section 360bbb-3(b)(1), unless the authorization is terminated or revoked sooner.    Influenza A by PCR NEGATIVE NEGATIVE Final   Influenza B by PCR NEGATIVE NEGATIVE Final    Comment: (NOTE) The Xpert Xpress SARS-CoV-2/FLU/RSV assay is intended as an aid in  the diagnosis of influenza from Nasopharyngeal swab specimens and  should not be used as a sole basis for treatment. Nasal washings and  aspirates are unacceptable for Xpert Xpress SARS-CoV-2/FLU/RSV  testing. Fact Sheet for Patients: PinkCheek.be Fact Sheet for Healthcare Providers: GravelBags.it This test is not yet approved or cleared by the [place] FDA and  has been authorized for detection and/or diagnosis of SARS-CoV-2 by  FDA under an Emergency Use Authorization (EUA). This EUA will remain  in effect (meaning this test can be used) for the duration of the  Covid-19 declaration under Section 564(b)(1) of the Act, 21  U.S.C. section 360bbb-3(b)(1), unless the authorization is  terminated or revoked. Performed at [facility], [place] [address], [place], [place] [address]   MRSA PCR Screening     Status: None   Collection Time: 06/06/19  8:08 PM   Specimen: Nasal Mucosa; Nasopharyngeal    Result Value Ref Range Status   MRSA by PCR NEGATIVE NEGATIVE Final    Comment:        The GeneXpert MRSA Assay (FDA approved for NASAL specimens only), is one component of a comprehensive MRSA colonization surveillance program. It is not intended to diagnose MRSA infection nor to guide or monitor treatment for MRSA infections. Performed at [facility], [place] [address] [place], [place] [address]      Radiology Studies: CT Code Stroke CTA Head W/WO contrast  Result Date: 06/06/2019 CLINICAL DATA:  Stroke EXAM: CT PERFUSION BRAIN TECHNIQUE: Multiphase CT imaging of the brain was performed following IV bolus contrast injection. Subsequent parametric  perfusion maps were calculated using RAPID software. CONTRAST:  123mL OMNIPAQUE IOHEXOL 350 MG/ML SOLN COMPARISON:  CTA head 06/06/2019 FINDINGS: CT Brain Perfusion Findings: CBF (<30%) Volume: 30mL Perfusion (Tmax>6.0s) volume: 44mL Mismatch Volume: 15mL ASPECTS on noncontrast CT Head: Aspect score not calculated due to intracranial hemorrhage. Infarct Core: 9 mL Infarction Location:Left parietal lobe IMPRESSION: 9 mL of acute infarction left parietal lobe with 81 mL of surrounding penumbra. Electronically Signed   By: Franchot Gallo M.D.   On: 06/06/2019 19:10   CT HEAD WO CONTRAST  Result Date: 06/07/2019 CLINICAL DATA:  Stroke.  Follow-up subdural hemorrhage EXAM: CT HEAD WITHOUT CONTRAST TECHNIQUE: Contiguous axial images were obtained from the base of the skull through the vertex without intravenous contrast. COMPARISON:  CTA head and CT perfusion 06/06/2019 FINDINGS: Brain: High-density acute hemorrhage over the left posterior frontal convexity is stable. This is a combination of subdural blood along the falx and subarachnoid hemorrhage over the convexity. No new hemorrhage. Ventricle size normal. Interval development of hypodensity in the left posterior lateral frontal lobe compatible with acute infarct. Additional area of hypodensity  has developed in the left medial frontal parietal lobe compatible with acute infarct. Vascular: Negative for hyperdense vessel Skull: Negative Sinuses/Orbits: Contracted right maxillary sinus. Remaining sinuses clear. Bilateral cataract extraction. Other: None IMPRESSION: Para falcine subdural hematoma unchanged. Subarachnoid and subdural hemorrhage over the left convexity unchanged. Interval development of acute infarct in the left frontal parietal lobe. This involves the medial portion of the motor cortex. There may also be involvement of the lateral portion of the motor cortex on the left. Electronically Signed   By: Franchot Gallo M.D.   On: 06/07/2019 11:32   CT Code Stroke CTA Neck W/WO contrast  Result Date: 06/06/2019 CLINICAL DATA:  Code stroke. Fell and hit head. Intracranial hemorrhage. Slurred speech and right-sided weakness. EXAM: CT ANGIOGRAPHY HEAD AND NECK CT PERFUSION BRAIN TECHNIQUE: Multidetector CT imaging of the head and neck was performed using the standard protocol during bolus administration of intravenous contrast. Multiplanar CT image reconstructions and MIPs were obtained to evaluate the vascular anatomy. Carotid stenosis measurements (when applicable) are obtained utilizing NASCET criteria, using the distal internal carotid diameter as the denominator. Multiphase CT imaging of the brain was performed following IV bolus contrast injection. Subsequent parametric perfusion maps were calculated using RAPID software. CONTRAST:  100 mL Omnipaque 350 IV COMPARISON:  CT head approximately 2 hours prior FINDINGS: CT HEAD FINDINGS Brain: Acute extra-axial hemorrhage left posterior frontal convexity appears unchanged. This is a combination of subdural para falcine blood as well as adjacent subarachnoid hemorrhage. The largest area of hemorrhage measures approximately 25 x 15 mm. Ventricle size normal. No acute ischemic infarct. Negative for hemorrhage or mass. Vascular: Negative for hyperdense  vessel. Arterial calcification diffusely Skull: Negative for skull fracture Sinuses/Orbits: Mucosal edema paranasal sinuses. Contracted right maxillary sinus. Bilateral cataract surgery.  No orbital mass. Other: None ASPECTS ([place] Stroke Program Early CT Score) Not applicable due to intracranial hemorrhage. CTA NECK FINDINGS Aortic arch: Standard branching. Imaged portion shows no evidence of aneurysm or dissection. No significant stenosis of the major arch vessel origins. Atherosclerotic disease in the aortic arch and proximal great vessels. Pacemaker noted. Right carotid system: Atherosclerotic disease right carotid bifurcation. Approximately 50% diameter stenosis proximal right internal carotid artery. Mild atherosclerotic disease right common carotid artery. Left carotid system: Atherosclerotic disease left carotid bifurcation without significant stenosis. Vertebral arteries: Both vertebral arteries patent to the basilar without significant stenosis. Skeleton: Degenerative changes  cervical spine. No acute skeletal abnormality. Other neck: Negative Upper chest: Lung apices clear bilaterally.  Left-sided pacemaker. Review of the MIP images confirms the above findings CTA HEAD FINDINGS Anterior circulation: Atherosclerotic calcification in the cavernous carotid bilaterally causing mild stenosis. Right middle cerebral artery widely patent. Both anterior cerebral arteries widely patent. Left M1 widely patent. There is a branch occlusion left M3. There is flow past the short segment occlusion with decreased perfusion in this area. Probable additional distal emboli past the occlusion. This corresponds to an area of ischemia on CT perfusion. Posterior circulation: Mild atherosclerotic stenosis of the distal vertebral artery bilaterally. Right PICA patent. Left PICA not visualized. AICA patent bilaterally. Mild atherosclerotic disease in the basilar. Superior cerebellar and posterior cerebral arteries patent  bilaterally. Fetal origin right posterior cerebral artery. Venous sinuses: Patent Anatomic variants: None Review of the MIP images confirms the above findings CT Brain Perfusion Findings: ASPECTS: Not scored due to intracranial hemorrhage CBF (<30%) Volume: 39mL Perfusion (Tmax>6.0s) volume: 44mL Mismatch Volume: 80mL Infarction Location:Left parietal lobe IMPRESSION: 1. Left convexity hemorrhage unchanged from earlier today. This appears to be a combination of left parafalcine subdural hematoma and subarachnoid hemorrhage. This is related to trauma given history of fall. No acute ischemic infarct on CT. 2. 50% diameter stenosis proximal right internal carotid artery. Left carotid artery patent without significant stenosis. Both vertebral arteries patent in the neck. 3. Segmental occlusion left M3 branch with trickle distal flow and additional clot distally. 4. CT perfusion demonstrates 9 mL of core infarct and 81 mL of additional ischemia in the left parietal lobe corresponding to the left M3 branch occlusion. 5. These results were called by telephone at the time of interpretation on 06/06/2019 at 6:44 pm to provider ERIC [facility] , who verbally acknowledged these results. Electronically Signed   By: Franchot Gallo M.D.   On: 06/06/2019 18:45   CT Code Stroke Cerebral Perfusion with contrast  Result Date: 06/06/2019 CLINICAL DATA:  Stroke EXAM: CT PERFUSION BRAIN TECHNIQUE: Multiphase CT imaging of the brain was performed following IV bolus contrast injection. Subsequent parametric perfusion maps were calculated using RAPID software. CONTRAST:  162mL OMNIPAQUE IOHEXOL 350 MG/ML SOLN COMPARISON:  CTA head 06/06/2019 FINDINGS: CT Brain Perfusion Findings: CBF (<30%) Volume: 31mL Perfusion (Tmax>6.0s) volume: 48mL Mismatch Volume: 93mL ASPECTS on noncontrast CT Head: Aspect score not calculated due to intracranial hemorrhage. Infarct Core: 9 mL Infarction Location:Left parietal lobe IMPRESSION: 9 mL of acute infarction  left parietal lobe with 81 mL of surrounding penumbra. Electronically Signed   By: Franchot Gallo M.D.   On: 06/06/2019 19:10   DG Chest Port 1 View  Result Date: 06/07/2019 CLINICAL DATA:  CVA. EXAM: PORTABLE CHEST 1 VIEW COMPARISON:  06/02/2019 FINDINGS: The pacer wires/AICD are stable. Stable mild cardiac enlargement. Diffuse slightly nodular interstitial process in the lungs could suggest interstitial edema or interstitial pneumonitis. No focal infiltrates or effusions. IMPRESSION: Diffuse slightly nodular interstitial process in the lungs could suggest interstitial edema or interstitial pneumonitis. Electronically Signed   By: Marijo Sanes M.D.   On: 06/07/2019 08:32   DG Swallowing Func-Speech Pathology  Result Date: 06/07/2019 Objective Swallowing Evaluation: Type of Study: MBS-Modified Barium Swallow Study  Patient Details Name: WILMAR BRUNEY MRN: [id number] Date of Birth: [birth year] Today's Date: 06/07/2019 Time: SLP Start Time (ACUTE ONLY): 1200 -SLP Stop Time (ACUTE ONLY): 1230 SLP Time Calculation (min) (ACUTE ONLY): 30 min Past Medical History: Past Medical History: Diagnosis Date . AICD (automatic cardioverter/defibrillator) present 01/17/2003  Medtronic Maximo [id number] ICD, serial [id number] S . Anemia 02-06-11  takes oral iron . Arthritis   hands, knees . CAD (coronary artery disease) 2003  a. h/o MI and CABG in 2003. b. s/p DES to SVG-RPDA-RPLB in 08/2014. Marland Kitchen Cancer of sigmoid colon ([place]) 2012  a. s/p colon surgery. . Carotid bruit  . Chronic systolic CHF (congestive heart failure) (HCC)   a. EF 20% in 2014; b. 08/2017 Echo: EF 20-25%, diff HK, Gr3 DD. Triv AI. Mod MR. Sev dil LA. Mildly dil RV w/ mildly reduced RV fxn. Mildly dil RA. Mod TR. PASP 61mHg. Marland Kitchen Cough  . GERD (gastroesophageal reflux disease) 02-06-11 . HTN (hypertension)  . Hyperlipidemia  . Ischemic cardiomyopathy   a. EF 20% in 2014. (Master study EF >20%); b. 08/2017 Echo: EF 20-25%, diff HK. Gr3 DD. . LV (left ventricular) mural  thrombus   a. 12/2012 Echo: EF 20% with mural thrombus No evidence of thrombus on 08/2017 echo. . Macular degeneration  . Myocardial infarct ([place])   2003 . PAF (paroxysmal atrial fibrillation) (HCC)   a. CHA2DS2VASc = 5-->Xarelto/Tikosyn. Past Surgical History: Past Surgical History: Procedure Laterality Date . CARDIAC CATHETERIZATION N/A 09/20/2014  Procedure: Left Heart Cath and Coronary Angiography;  Surgeon: Jettie Booze, MD; LAD 95%, D1 100%, CFX liner percent, OM 200%, OM 390%, RCA 90%, LIMA-LAD okay, SVG-OM 2-OM 3 minimal disease, SVG-RPDA-RPLB 100% between the RPDA and RPL    . CARDIAC CATHETERIZATION N/A 09/20/2014  Procedure: Coronary Stent Intervention;  Surgeon: Jettie Booze, MD; Synergy DES 4 x 24 mm reducing the stenosis to 5%  . CARDIAC DEFIBRILLATOR PLACEMENT  01/17/03  6949 lead. Medtronic. remote-no; with later revision . CARDIOVERSION N/A 05/22/2016  Procedure: CARDIOVERSION;  Surgeon: Lelon Perla, MD;  Location[facility] ENDOSCOPY;  Service: Cardiovascular;  Laterality: N/A; . CARDIOVERSION N/A 08/10/2017  Procedure: CARDIOVERSION;  Surgeon: Pixie Casino, MD;  Location: [facility] ENDOSCOPY;  Service: Cardiovascular;  Laterality: N/A; . CATARACT EXTRACTION W/ INTRAOCULAR LENS  IMPLANT, BILATERAL Bilateral June/-July 2009  Dr. Katy Fitch . COLON RESECTION  02/09/2011  Procedure: LAPAROSCOPIC SIGMOID COLON RESECTION;  Surgeon: Pedro Earls, MD;  Location: [facility] ORS;  Service: General;  Laterality: N/A;  Laparoscopic Assisted Sigmoid Colectomy . COLON SURGERY   . COLONOSCOPY  08/31/2011  Procedure: COLONOSCOPY;  Surgeon: Jerene Bears, MD;  Location: [facility] ENDOSCOPY;  Service: Gastroenterology;  Laterality: N/A; . COLONOSCOPY N/A 09/05/2012  Procedure: COLONOSCOPY;  Surgeon: Jerene Bears, MD;  Location: [facility] ENDOSCOPY;  Service: Gastroenterology;  Laterality: N/A; . COLONOSCOPY N/A 04/18/2013  Procedure: COLONOSCOPY;  Surgeon: Jerene Bears, MD;  Location: [facility] ENDOSCOPY;  Service: Gastroenterology;  Laterality:  N/A; . COLONOSCOPY N/A 04/09/2014  Procedure: COLONOSCOPY;  Surgeon: Jerene Bears, MD;  Location: [facility] ENDOSCOPY;  Service: Gastroenterology;  Laterality: N/A; . COLONOSCOPY WITH PROPOFOL N/A 05/03/2017  Procedure: COLONOSCOPY WITH PROPOFOL;  Surgeon: Jerene Bears, MD;  Location: [facility] ENDOSCOPY;  Service: Gastroenterology;  Laterality: N/A; . CORONARY ANGIOPLASTY   . CORONARY ARTERY BYPASS GRAFT  01/2002  LIMA-LAD, SVG-OM 2-OM 3, SVG-RPDA-RPLB . CORONARY STENT INTERVENTION N/A 11/22/2018  Procedure: CORONARY STENT INTERVENTION;  Surgeon: Nelva Bush, MD;  Location: [place] CV LAB;  Service: Cardiovascular;  Laterality: N/A;  SVG - RCA . ICD GENERATOR CHANGE  2010  Medtronic Virtuoso II VR ICD . ICD GENERATOR CHANGEOUT N/A 12/07/2018  Procedure: ICD GENERATOR CHANGEOUT;  Surgeon: Deboraha Sprang, MD;  Location: [place] [facility];  Service: Cardiovascular;  Laterality: N/A; . INGUINAL HERNIA  REPAIR Right 2000's X 2 . LAPAROSCOPIC RIGHT HEMI COLECTOMY N/A 11/04/2012  Procedure: LAPAROSCOPIC RIGHT HEMI COLECTOMY;  Surgeon: Pedro Earls, MD;  Location: [facility] ORS;  Service: General;  Laterality: N/A; . LEFT HEART CATH AND CORS/GRAFTS ANGIOGRAPHY Left 11/22/2018  Procedure: LEFT HEART CATH AND CORS/GRAFTS ANGIOGRAPHY;  Surgeon: Nelva Bush, MD;  Location: [place] [facility] LAB;  Service: Cardiovascular;  Laterality: Left; . TONSILLECTOMY  ~ 1950 HPI: KALEN DREES is a 79 y.o. male who was admitted 3/9 with traumatic ICH. PMH including but not limited to PAF on xarelto, MI, LV thrombus, ICM, HTN, HLD, CAD, CA of sigmoid colon s/p surgery (see "past medical history" for rest). CT (3/9) revealing a combination of left parafalcine subdural hematoma and subarachnoid hemorrhage. This is related to trauma given history of fall. No acute ischemic infarct on CT. Presents with Expressive > receptive apahsia. H/o GERD ad coughing.  Subjective: cooperative Assessment / Plan / Recommendation CHL IP CLINICAL IMPRESSIONS 06/07/2019  Clinical Impression Pt presents with oropharyngeal dysphagia across consistencies. Oral phase is remarkable for lingual residue. Pharyngeal phase is remarkable for reduced BOT retraction and epiglottic inversion resulting in vallecular and pyriform sinsus residue. Vallecular residue significantly increased with progression of advanced textures, and multiple strategies were attempted to reduce the amount, with the effortful swallow being the most effective. Trace penetration (PAS 3) occured x2 as the pt also utilized a liquid wash to reduce vallecular residue following advanced textures, however, it was not observed following any other textures. There was frequent coughing and throat clearing as seen at bedside, but was not related to aspiration. Given the penetration and amount of residue with advanced textures, recommend Dys 2 and thin liquids, utilizing liquid wash PRN, multiple hard swallows following each bite/sip, clearing throat intermittently. SLP Visit Diagnosis Dysphagia, oropharyngeal phase (R13.12) Attention and concentration deficit following -- Frontal lobe and executive function deficit following -- Impact on safety and function --   CHL IP TREATMENT RECOMMENDATION 06/07/2019 Treatment Recommendations Therapy as outlined in treatment plan below   Prognosis 06/07/2019 Prognosis for Safe Diet Advancement Good Barriers to Reach Goals -- Barriers/Prognosis Comment -- CHL IP DIET RECOMMENDATION 06/07/2019 SLP Diet Recommendations Dysphagia 2 (Fine chop) solids;Thin liquid Liquid Administration via Cup;Straw;Spoon Medication Administration Crushed with puree Compensations Minimize environmental distractions;Slow rate;Small sips/bites;Multiple dry swallows after each bite/sip;Follow solids with liquid;Effortful swallow Postural Changes Seated upright at 90 degrees;Remain semi-upright after after feeds/meals (Comment)   CHL IP OTHER RECOMMENDATIONS 06/07/2019 Recommended Consults -- Oral Care Recommendations Oral  care BID Other Recommendations --   CHL IP FOLLOW UP RECOMMENDATIONS 06/07/2019 Follow up Recommendations Other (comment)   CHL IP FREQUENCY AND DURATION 06/07/2019 Speech Therapy Frequency (ACUTE ONLY) min 2x/week Treatment Duration 2 weeks      CHL IP ORAL PHASE 06/07/2019 Oral Phase Impaired Oral - Pudding Teaspoon -- Oral - Pudding Cup -- Oral - Honey Teaspoon -- Oral - Honey Cup -- Oral - Nectar Teaspoon -- Oral - Nectar Cup Lingual/palatal residue Oral - Nectar Straw -- Oral - Thin Teaspoon -- Oral - Thin Cup WFL Oral - Thin Straw WFL Oral - Puree WFL Oral - Mech Soft -- Oral - Regular Lingual/palatal residue Oral - Multi-Consistency -- Oral - Pill -- Oral Phase - Comment --  CHL IP PHARYNGEAL PHASE 06/07/2019 Pharyngeal Phase Impaired Pharyngeal- Pudding Teaspoon -- Pharyngeal -- Pharyngeal- Pudding Cup -- Pharyngeal -- Pharyngeal- Honey Teaspoon -- Pharyngeal -- Pharyngeal- Honey Cup -- Pharyngeal -- Pharyngeal- Nectar Teaspoon -- Pharyngeal -- Pharyngeal- Nectar  Cup Pharyngeal residue - valleculae;Pharyngeal residue - pyriform;Reduced tongue base retraction;Compensatory strategies attempted (with notebox) Pharyngeal -- Pharyngeal- Nectar Straw -- Pharyngeal -- Pharyngeal- Thin Teaspoon -- Pharyngeal -- Pharyngeal- Thin Cup Pharyngeal residue - valleculae;Pharyngeal residue - pyriform;Reduced tongue base retraction;Penetration/Aspiration during swallow Pharyngeal Material enters airway, remains ABOVE vocal cords then ejected out Pharyngeal- Thin Straw Pharyngeal residue - valleculae;Pharyngeal residue - pyriform;Reduced tongue base retraction Pharyngeal -- Pharyngeal- Puree Pharyngeal residue - pyriform;Pharyngeal residue - valleculae;Reduced tongue base retraction Pharyngeal -- Pharyngeal- Mechanical Soft -- Pharyngeal -- Pharyngeal- Regular Pharyngeal residue - valleculae;Pharyngeal residue - pyriform;Reduced tongue base retraction Pharyngeal -- Pharyngeal- Multi-consistency -- Pharyngeal -- Pharyngeal- Pill  -- Pharyngeal -- Pharyngeal Comment --  CHL IP CERVICAL ESOPHAGEAL PHASE 06/07/2019 Cervical Esophageal Phase WFL Pudding Teaspoon -- Pudding Cup -- Honey Teaspoon -- Honey Cup -- Nectar Teaspoon -- Nectar Cup -- Nectar Straw -- Thin Teaspoon -- Thin Cup -- Thin Straw -- Puree -- Mechanical Soft -- Regular -- Multi-consistency -- Pill -- Cervical Esophageal Comment -- Osie Bond., M.A. [place] Pager [phone] Office [phone] 06/07/2019, 2:23 PM              ECHOCARDIOGRAM COMPLETE  Result Date: 06/07/2019    ECHOCARDIOGRAM REPORT   Patient Name:   MCHENRY LABA Date of Exam: 06/07/2019 Medical Rec #:  [id number]      Height:       72.0 in Accession #:    [id number]     Weight:       159.8 lb Date of Birth:  11/12/1940      BSA:          1.937 m Patient Age:    79 years       BP:           139/85 mmHg Patient Gender: M              HR:           91 bpm. Exam Location:  Inpatient Procedure: 2D Echo and Intracardiac Opacification Agent Indications:    Stroke 434.91 / I163.9  History:        Patient has prior history of Echocardiogram examinations, most                 recent 09/03/2017. CAD and Previous Myocardial Infarction,                 Defibrillator, Arrythmias:Atrial Fibrillation,                 Signs/Symptoms:Chest Pain; Risk Factors:Hypertension. Ischemic                 cardiomyopathy.  Sonographer:    Darlina Sicilian RDCS Referring Phys: [id number] [place]  1. No mural thrombus with Definity contrast. Left ventricular ejection fraction, by estimation, is <20%. The left ventricle has severely decreased function. The left ventricle demonstrates global hypokinesis with regional variation. The left ventricular  internal cavity size was moderately to severely dilated.  2. Right ventricular systolic function is moderately reduced. The right ventricular size is mildly enlarged. A AICD wire is visualized. There is moderately elevated pulmonary artery systolic pressure.   3. Left atrial size was moderately dilated.  4. The mitral valve is abnormal. Mild mitral valve regurgitation.  5. The aortic valve is tricuspid. Aortic valve regurgitation is mild. Mild aortic valve sclerosis is present, with no evidence of aortic valve stenosis.  6. The inferior vena cava is dilated in size with <50%  respiratory variability, suggesting right atrial pressure of 15 mmHg. Comparison(s): Changes from prior study are noted. 09/03/2017: LVEF 20-25%. FINDINGS  Left Ventricle: No mural thrombus with Definity contrast. Left ventricular ejection fraction, by estimation, is <20%. The left ventricle has severely decreased function. The left ventricle demonstrates global hypokinesis. Definity contrast agent was given IV to delineate the left ventricular endocardial borders. The left ventricular internal cavity size was moderately to severely dilated. There is no left ventricular hypertrophy. Left ventricular diastolic parameters are indeterminate. Right Ventricle: The right ventricular size is mildly enlarged. No increase in right ventricular wall thickness. Right ventricular systolic function is moderately reduced. There is moderately elevated pulmonary artery systolic pressure. The tricuspid regurgitant velocity is 2.82 m/s, and with an assumed right atrial pressure of 15 mmHg, the estimated right ventricular systolic pressure is [id number]. Left Atrium: Left atrial size was moderately dilated. Right Atrium: Right atrial size was normal in size. Pericardium: There is no evidence of pericardial effusion. Mitral Valve: The mitral valve is abnormal. There is mild thickening of the mitral valve leaflet(s). Mild mitral valve regurgitation. Tricuspid Valve: The tricuspid valve is grossly normal. Tricuspid valve regurgitation is mild. Aortic Valve: The aortic valve is tricuspid. Aortic valve regurgitation is mild. Mild aortic valve sclerosis is present, with no evidence of aortic valve stenosis. Pulmonic Valve: The  pulmonic valve was grossly normal. Pulmonic valve regurgitation is trivial. Aorta: The aortic root, ascending aorta, aortic arch and descending aorta are all structurally normal, with no evidence of dilitation or obstruction. Venous: The inferior vena cava is dilated in size with less than 50% respiratory variability, suggesting right atrial pressure of 15 mmHg. IAS/Shunts: No atrial level shunt detected by color flow Doppler. Additional Comments: A AICD wire is visualized.  LEFT VENTRICLE PLAX 2D LVIDd:         6.93 cm LVIDs:         6.15 cm LV PW:         1.01 cm LV IVS:        1.06 cm LVOT diam:     1.90 cm LV SV:         32 LV SV Index:   17 LVOT Area:     2.84 cm  LV Volumes (MOD) LV vol d, MOD A2C: 268.0 ml LV vol d, MOD A4C: 307.5 ml LV vol s, MOD A2C: 210.0 ml LV vol s, MOD A4C: 223.0 ml LV SV MOD A2C:     58.0 ml LV SV MOD A4C:     307.5 ml LV SV MOD BP:      67.7 ml RIGHT VENTRICLE RV S prime:     7.67 cm/s LEFT ATRIUM             Index       RIGHT ATRIUM           Index LA diam:        5.30 cm 2.74 cm/m  RA Area:     24.40 cm LA Vol (A2C):   65.4 ml 33.76 ml/m RA Volume:   77.10 ml  39.81 ml/m LA Vol (A4C):   84.8 ml 43.78 ml/m LA Biplane Vol: 77.2 ml 39.86 ml/m  AORTIC VALVE LVOT Vmax:   83.30 cm/s LVOT Vmean:  48.200 cm/s LVOT VTI:    0.113 m  AORTA Ao Root diam: 3.40 cm Ao Asc diam:  3.50 cm MITRAL VALVE                TRICUSPID  VALVE MV Area (PHT): 5.68 cm     TR Peak grad:   31.8 mmHg MV Decel Time: 134 msec     TR Vmax:        282.00 cm/s MV E velocity: 125.50 cm/s                             SHUNTS                             Systemic VTI:  0.11 m                             Systemic Diam: 1.90 cm Lyman Bishop MD Electronically signed by Lyman Bishop MD Signature Date/Time: 06/07/2019/4:27:13 PM    Final     Scheduled Meds: . atorvastatin  80 mg Oral q1800  . carvedilol  12.5 mg Oral BID WC  . chlorhexidine  15 mL Mouth Rinse BID  . Chlorhexidine Gluconate Cloth  6 each Topical  Daily  . ezetimibe  10 mg Oral Daily  . furosemide  40 mg Oral Daily  . isosorbide mononitrate  30 mg Oral Daily  . mouth rinse  15 mL Mouth Rinse q12n4p  . pantoprazole  40 mg Oral Daily  . vitamin B-12  100 mcg Oral Daily   Continuous Infusions:   LOS: 2 days   Marylu Lund, MD [facility] Pager On Amion  If 7PM-7AM, please contact night-coverage 06/08/2019, 4:41 PM

## 2019-06-08 NOTE — Evaluation (Signed)
Occupational Therapy Evaluation Patient Details Name: Mark Clayton MRN: [id number] DOB: 07/05/1940 Today's Date: 06/08/2019    History of Present Illness Mark Clayton is a 79 y.o. male who was admitted 3/9 with traumatic ICH after falling backwards onto concrete. PMH including PAF on xarelto, MI, LV thrombus, ICM, HTN, HLD, CAD, CA of sigmoid colon s/p surgery. CT (3/9) revealing a combination of left parafalcine subdural hematoma and subarachnoid hemorrhage. No acute ischemic infarct on CT.    Clinical Impression   Patient presenting with decreased I in self care, balance, functional mobility/transfers, endurance, and strengthening. Patient reports being independent PTA and family would assist at times secondary to visual deficits but he is very independent. Patient currently functioning at +2 for all tasks seconds to dense R hemiplegia. Pt is flaccid on R UE and LE.  Patient will benefit from acute OT to increase overall independence in the areas of ADLs, functional mobility, and safety awareness in order to safely discharge to next venue of care.    Follow Up Recommendations  CIR    Equipment Recommendations  Other (comment)(defer to next venue of care)    Recommendations for Other Services Rehab consult     Precautions / Restrictions Precautions Precautions: Fall;Other (comment) Precaution Comments: R hemiparesis Restrictions Weight Bearing Restrictions: No      Mobility Bed Mobility Overal bed mobility: Needs Assistance Bed Mobility: Supine to Sit;Sit to Supine     Supine to sit: Max assist;+2 for physical assistance Sit to supine: Max assist;+2 for physical assistance   General bed mobility comments: MaxA + 2 for supine <> sit. Pt able to progress LLE to edge of bed and hold onto bed rail with LUE. Minimal push off and requiring assist for trunk elevation, RLE negotiation, and use of bed pad to pull hips anteriorly. Trunk and leg guidance required for return to  supine  Transfers Overall transfer level: Needs assistance Equipment used: None Transfers: Sit to/from Stand Sit to Stand: +2 physical assistance;Mod assist;Max assist         General transfer comment: ModA + 2 to stand from edge of bed with right knee blocked, pt with good initiation and activation. R knee buckle after ~5 seconds requiring maxA to maintain standing.     Balance Overall balance assessment: Needs assistance Sitting-balance support: Feet supported Sitting balance-Leahy Scale: Zero Sitting balance - Comments: Pt requiring up to maxA due to right lateral lean, able to correct minimally with LUE on rail  Postural control: Right lateral lean          ADL either performed or assessed with clinical judgement   ADL Overall ADL's : Needs assistance/impaired Eating/Feeding: Minimal assistance   Grooming: Wash/dry hands;Wash/dry face;Oral care;Sitting;Set up   Upper Body Bathing: Moderate assistance;Sitting   Lower Body Bathing: Total assistance;Maximal assistance;Bed level   Upper Body Dressing : Minimal assistance   Lower Body Dressing: Total assistance            Vision Baseline Vision/History: (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) Patient Visual Report: No change from baseline              Pertinent Vitals/Pain Pain Assessment: Faces Faces Pain Scale: No hurt     Hand Dominance Right   Extremity/Trunk Assessment Upper Extremity Assessment Upper Extremity Assessment: RUE deficits/detail RUE Deficits / Details: No active movement noted   Lower Extremity Assessment Lower Extremity Assessment: RLE deficits/detail RLE Deficits / Details: No active movement noted       Communication  Communication Communication: Receptive difficulties;Expressive difficulties   Cognition Arousal/Alertness: Awake/alert Behavior During Therapy: WFL for tasks assessed/performed Overall Cognitive Status: Difficult to assess Area of Impairment: Following  commands;Memory;Attention;Safety/judgement;Awareness      Current Attention Level: Focused;Sustained Memory: Decreased short-term memory Following Commands: Follows one step commands inconsistently Safety/Judgement: Decreased awareness of safety;Decreased awareness of deficits Awareness: Intellectual   General Comments: Pt with expressive > receptive aphasia. Difficult to assess orientation; pt able to recall date, stating "yes," when asked if he was in a hospital. Recalled traumatic incident but not imaging findings i.e. that he had a SDH/SAH and its functional impact. Pt tending to give verbose, tangential answers when asked a question, most of which was unintelligible. Did not know how to use call bell; able to recall with min cues after period of time.               Home Living Family/patient expects to be discharged to:: Private residence Living Arrangements: Other relatives Available Help at Discharge: Family Type of Home: House Home Access: Other (comment)(threshold)     Home Layout: Able to live on main level with bedroom/bathroom     Bathroom Shower/Tub: Tub/shower unit             Additional Comments: Granddaughter goes to college T-Sa 10-2; states there are other family members who are offering to provide 24/7 i.e. 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 Manages own medications with intermittent assist from granddaughter for set up, cooks, cleans. Enjoys wood working, Control and instrumentation engineer.          OT Problem List: Decreased strength;Decreased coordination;Pain;Decreased cognition;Decreased activity tolerance;Decreased safety awareness;Impaired balance (sitting and/or standing);Impaired vision/perception;Decreased knowledge of precautions;Impaired UE functional use;Impaired tone;Impaired sensation;Decreased range of motion;Decreased  knowledge of use of DME or AE      OT Treatment/Interventions: Self-care/ADL training;Therapeutic exercise;Therapeutic activities;Neuromuscular education;Cognitive remediation/compensation;Energy conservation;Patient/family education;DME and/or AE instruction;Visual/perceptual remediation/compensation;Manual therapy;Balance training;Modalities    OT Goals(Current goals can be found in the care plan section) Acute Rehab OT Goals Patient Stated Goal: none stated; pt granddaughter would like him to go to rehab OT Goal Formulation: With family Time For Goal Achievement: 06/22/19 Potential to Achieve Goals: Good ADL Goals Pt Will Perform Grooming: with set-up;sitting Pt Will Perform Upper Body Bathing: with set-up;sitting Pt Will Transfer to Toilet: with max assist;stand pivot transfer Pt Will Perform Toileting - Clothing Manipulation and hygiene: with max assist;sit to/from stand  OT Frequency: Min 2X/week   Barriers to D/C:    none known at this time       Co-evaluation PT/OT/SLP Co-Evaluation/Treatment: Yes Reason for Co-Treatment: Complexity of the patient's impairments (multi-system involvement);Necessary to address cognition/behavior during functional activity;For patient/therapist safety;To address functional/ADL transfers PT goals addressed during session: Mobility/safety with mobility;Balance OT goals addressed during session: ADL's and self-care;Other (comment)(functional mobility)      AM-PAC OT "6 Clicks" Daily Activity     Outcome Measure Help from another person eating meals?: A Little Help from another person taking care of personal grooming?: A Little Help from another person toileting, which includes using toliet, bedpan, or urinal?: Total Help from another person bathing (including washing, rinsing, drying)?: A Lot Help from another person to put on and taking off regular upper body clothing?: A Lot Help from another person to put on and taking off regular lower body  clothing?: Total 6 Click Score: 12  End of Session Nurse Communication: Precautions  Activity Tolerance: Patient tolerated treatment well Patient left: in bed;with call bell/phone within reach;with bed alarm set  OT Visit Diagnosis: Muscle weakness (generalized) (M62.81);History of falling (Z91.81);Hemiplegia and hemiparesis Hemiplegia - Right/Left: Right Hemiplegia - dominant/non-dominant: Dominant                Time: [id number] OT Time Calculation (min): 31 min Charges:  OT Evaluation $OT Eval High Complexity: 1 High   Midori Dado P MS, OTR/L 06/08/2019, 1:20 PM

## 2019-06-08 NOTE — Progress Notes (Addendum)
STROKE TEAM PROGRESS NOTE   INTERVAL HISTORY No family at bedside.  Patient stated that his daughter will be back tomorrow.  Patient lying in bed, still has right hemiplegia.  However, no neuro changes.  Vitals:   06/08/19 0405 06/08/19 0436 06/08/19 0756 06/08/19 1130  BP: (!) 147/88  (!) 154/92 (!) 138/105  Pulse: 95  98 81  Resp: 18  20 18   Temp: 97.6 F (36.4 C)  98.2 F (36.8 C) 98.1 F (36.7 C)  TempSrc: Oral  Oral Oral  SpO2: 95%  91% 97%  Weight:  77.9 kg      CBC:  Recent Labs  Lab 06/02/19 1917 06/06/19 1549 06/06/19 1551 06/07/19 0540 06/08/19 0509  WBC 6.2 5.9   < > 9.8 8.3  NEUTROABS 3.6 3.5  --   --   --   HGB 11.7* 12.2*   < > 11.6* 12.0*  HCT 36.4* 37.6*   < > 35.4* 36.6*  MCV 86 88.3   < > 86.8 88.4  PLT 170 176   < > 177 163   < > = values in this interval not displayed.    Basic Metabolic Panel:  Recent Labs  Lab 06/07/19 0540 06/08/19 0509  NA 144 141  K 3.1* 3.4*  CL 111 108  CO2 23 23  GLUCOSE 125* 134*  BUN 10 14  CREATININE 1.01 1.20  CALCIUM 8.9 8.9  MG 1.9 2.2  PHOS 1.8* 3.0   Lipid Panel:     Component Value Date/Time   CHOL 121 06/07/2019 0540   CHOL 182 11/16/2018 0900   TRIG 112 06/07/2019 0540   HDL 39 (L) 06/07/2019 0540   HDL 56 11/16/2018 0900   CHOLHDL 3.1 06/07/2019 0540   VLDL 22 06/07/2019 0540   LDLCALC 60 06/07/2019 0540   LDLCALC 102 (H) 11/16/2018 0900   HgbA1c:  Lab Results  Component Value Date   HGBA1C 6.1 (H) 06/07/2019   Urine Drug Screen:     Component Value Date/Time   LABOPIA NONE DETECTED 06/07/2019 1400   COCAINSCRNUR NONE DETECTED 06/07/2019 1400   LABBENZ NONE DETECTED 06/07/2019 1400   AMPHETMU NONE DETECTED 06/07/2019 1400   THCU NONE DETECTED 06/07/2019 1400   LABBARB NONE DETECTED 06/07/2019 1400    Alcohol Level     Component Value Date/Time   ETH <10 06/06/2019 1549    IMAGING past 24 hours No results found.  PHYSICAL EXAM   Temp:  [97.5 F (36.4 C)-98.2 F (36.8  C)] 98.1 F (36.7 C) (03/11 1130) Pulse Rate:  [77-98] 81 (03/11 1130) Resp:  [18-20] 18 (03/11 1130) BP: (134-154)/(88-105) 138/105 (03/11 1130) SpO2:  [91 %-97 %] 97 % (03/11 1130) Weight:  [77.9 kg] 77.9 kg (03/11 0436)  General - Well nourished, well developed, in no apparent distress.  Ophthalmologic - fundi not visualized due to noncooperation.  Cardiovascular - irregularly irregular heart rate and rhythm.  Neuro - awake alert and orientated to place, people and age, month but not to year. Following simple commands, however, mild expressive aphasia with paraphasic errors. Difficulty with repeating sentences and naming. No gaze preference, no nystagmus, PERRL, EOMI. Visual field full. Right facial droop. Right hemiplegia, 1/5 on pain stimulation. Left UE and LE 5/5. DTR diminished on the right, no babinski. Sensation subjectively symmetric, coordination intact on the left FTN. Gait not able to test.    ASSESSMENT/PLAN [name] is a 79 y.o. male with history of  PAF on Xarelto, myocardial  infarct, hyperlipidemia, hypertension, congestive heart failure, carotid bruit, CAD along with AICD presenting after working in the yard where he fell backwards on a concrete driving and developed R hemiparesis, dysarthria. BP stable. CT showed hemorrhage. Xarelto reversed. Not sent to IR from ED given distal clot.   Stroke: L MCA infarct with L M2 occlusion, embolic secondary to known AF and severe cardiomyopathy even on Xarelto  Traumatic left vertex SDH due to fall with stroke  Xarelto reversed with Kcentra   NSG (Pool) consulted. No role for surgical intervention.  Code Stroke CT head extra-axial L interhemispheric hemorrhage  CTA head & neck L convexity hemorrhage (L parafalcine SDH and SAH) d/t trauma. Proximal R ICA 50% stenosis. L M3 branch occlusion w/ trickle flow and distal clot.   CT perfusion 65mL L parietal lobe infarct w/ 2mL penumbra  MRI not able to perform due to  AICD lead not compatible   CT repeat parafalcine SDH stable. L convexity SAH and SDH stable. Interval new L frontoparietal infarct.  2D Echo EF <20% no mural thrombus  LDL 60   HgbA1c 6.1   SCDs for VTE prophylaxis  clopidogrel 75 mg daily and Xarelto (rivaroxaban) daily prior to admission, now on No antithrombotic given hemorrhage.    Therapy recommendations:  CIR  Disposition:  pending   Atrial Fibrillation  Home anticoagulation:  Xarelto (rivaroxaban) daily  . tikosyn 250 bid and coreg (as below)  . Currently on no AC due to SDH . Rate in control   Hypertension  Home meds:  Coreg 25 q hs, lasix 40, isosorbide 30, losartan 50  Now on coreg 12.5 bid, lasix 40, imdur 30  Stable 130-150 . Treated with Cleviprex gtt in the ICU, now off . SBP goal < 160  . Long-term BP goal normotensive  Hyperlipidemia  Home meds:  lipitor 80 and zetia 10, now resumed  LDL 60, goal < 70  Continue statin at discharge  Dysphagia . Secondary to stroke . Cleared for D2 thin liquids . Speech on board  Other Stroke Risk Factors  Advanced age  ETOH use, alcohol level <10, advised to drink no more than 2 drink(s) a day  Coronary artery disease s/p MI  Chronic systolic Congestive heart failure  Ischemic cardiomyopathy   Hx LV mural thrombus  AICD [employer]  Other Active Problems  Nausea and vomiting d/t hemorrhage, prn zofran  Hypokalemia 3.1 - supplement  Hospital day # 2  Rosalin Hawking, MD PhD Stroke Neurology 06/08/2019 4:01 PM   To contact Stroke Continuity provider, please refer to [email or web address] After hours, contact General Neurology

## 2019-06-08 NOTE — Progress Notes (Signed)
Inpatient Rehab Admissions:  Inpatient Rehab Consult received.  I met with pt at the bedside for rehabilitation assessment and to discuss goals and expectations of an inpatient rehab admission. Pt very interested in the program and would like to proceed if insurance approves. With his permission I spoke with his granddaughter [name] to discuss program details and confirm DC plans. As pt has good social support at DC, [facility] will begin insurance authorization process for possible admit.   Raechel Ache, OTR/L  Rehab Admissions Coordinator  [phone] 06/08/2019 4:16 PM

## 2019-06-08 NOTE — Progress Notes (Addendum)
Providing Compassionate, Quality Care - Together   Subjective: Patient reports he's had a bad cough overnight. He denies pain.  Objective: Vital signs in last 24 hours: Temp:  [97.5 F (36.4 C)-98.2 F (36.8 C)] 98.1 F (36.7 C) (03/11 1130) Pulse Rate:  [75-98] 81 (03/11 1130) Resp:  [14-23] 18 (03/11 1130) BP: (134-154)/(85-105) 138/105 (03/11 1130) SpO2:  [91 %-98 %] 97 % (03/11 1130) Weight:  [77.9 kg] 77.9 kg (03/11 0436)  Intake/Output from previous day: 03/10 0701 - 03/11 0700 In: 1129.6 [I.V.:645.2; IV Piggyback:484.4] Out: -  Intake/Output this shift: Total I/O In: 120 [P.O.:120] Out: -   Patient is alert, oriented to self, place, situation PERRLA Mild expressive aphasia, dysarthria Hemiparesis right side Does not appear to have visual field cut  Lab Results: Recent Labs    06/07/19 0540 06/08/19 0509  WBC 9.8 8.3  HGB 11.6* 12.0*  HCT 35.4* 36.6*  PLT 177 163   BMET Recent Labs    06/07/19 0540 06/08/19 0509  NA 144 141  K 3.1* 3.4*  CL 111 108  CO2 23 23  GLUCOSE 125* 134*  BUN 10 14  CREATININE 1.01 1.20  CALCIUM 8.9 8.9    Studies/Results: CT Code Stroke CTA Head W/WO contrast  Result Date: 06/06/2019 CLINICAL DATA:  Stroke EXAM: CT PERFUSION BRAIN TECHNIQUE: Multiphase CT imaging of the brain was performed following IV bolus contrast injection. Subsequent parametric perfusion maps were calculated using RAPID software. CONTRAST:  13mL OMNIPAQUE IOHEXOL 350 MG/ML SOLN COMPARISON:  CTA head 06/06/2019 FINDINGS: CT Brain Perfusion Findings: CBF (<30%) Volume: 25mL Perfusion (Tmax>6.0s) volume: 60mL Mismatch Volume: 36mL ASPECTS on noncontrast CT Head: Aspect score not calculated due to intracranial hemorrhage. Infarct Core: 9 mL Infarction Location:Left parietal lobe IMPRESSION: 9 mL of acute infarction left parietal lobe with 81 mL of surrounding penumbra. Electronically Signed   By: Franchot Gallo M.D.   On: 06/06/2019 19:10   CT HEAD WO  CONTRAST  Result Date: 06/07/2019 CLINICAL DATA:  Stroke.  Follow-up subdural hemorrhage EXAM: CT HEAD WITHOUT CONTRAST TECHNIQUE: Contiguous axial images were obtained from the base of the skull through the vertex without intravenous contrast. COMPARISON:  CTA head and CT perfusion 06/06/2019 FINDINGS: Brain: High-density acute hemorrhage over the left posterior frontal convexity is stable. This is a combination of subdural blood along the falx and subarachnoid hemorrhage over the convexity. No new hemorrhage. Ventricle size normal. Interval development of hypodensity in the left posterior lateral frontal lobe compatible with acute infarct. Additional area of hypodensity has developed in the left medial frontal parietal lobe compatible with acute infarct. Vascular: Negative for hyperdense vessel Skull: Negative Sinuses/Orbits: Contracted right maxillary sinus. Remaining sinuses clear. Bilateral cataract extraction. Other: None IMPRESSION: Para falcine subdural hematoma unchanged. Subarachnoid and subdural hemorrhage over the left convexity unchanged. Interval development of acute infarct in the left frontal parietal lobe. This involves the medial portion of the motor cortex. There may also be involvement of the lateral portion of the motor cortex on the left. Electronically Signed   By: Franchot Gallo M.D.   On: 06/07/2019 11:32   CT Code Stroke CTA Neck W/WO contrast  Result Date: 06/06/2019 CLINICAL DATA:  Code stroke. Fell and hit head. Intracranial hemorrhage. Slurred speech and right-sided weakness. EXAM: CT ANGIOGRAPHY HEAD AND NECK CT PERFUSION BRAIN TECHNIQUE: Multidetector CT imaging of the head and neck was performed using the standard protocol during bolus administration of intravenous contrast. Multiplanar CT image reconstructions and MIPs were obtained to  evaluate the vascular anatomy. Carotid stenosis measurements (when applicable) are obtained utilizing NASCET criteria, using the distal internal  carotid diameter as the denominator. Multiphase CT imaging of the brain was performed following IV bolus contrast injection. Subsequent parametric perfusion maps were calculated using RAPID software. CONTRAST:  100 mL Omnipaque 350 IV COMPARISON:  CT head approximately 2 hours prior FINDINGS: CT HEAD FINDINGS Brain: Acute extra-axial hemorrhage left posterior frontal convexity appears unchanged. This is a combination of subdural para falcine blood as well as adjacent subarachnoid hemorrhage. The largest area of hemorrhage measures approximately 25 x 15 mm. Ventricle size normal. No acute ischemic infarct. Negative for hemorrhage or mass. Vascular: Negative for hyperdense vessel. Arterial calcification diffusely Skull: Negative for skull fracture Sinuses/Orbits: Mucosal edema paranasal sinuses. Contracted right maxillary sinus. Bilateral cataract surgery.  No orbital mass. Other: None ASPECTS ([place] Stroke Program Early CT Score) Not applicable due to intracranial hemorrhage. CTA NECK FINDINGS Aortic arch: Standard branching. Imaged portion shows no evidence of aneurysm or dissection. No significant stenosis of the major arch vessel origins. Atherosclerotic disease in the aortic arch and proximal great vessels. Pacemaker noted. Right carotid system: Atherosclerotic disease right carotid bifurcation. Approximately 50% diameter stenosis proximal right internal carotid artery. Mild atherosclerotic disease right common carotid artery. Left carotid system: Atherosclerotic disease left carotid bifurcation without significant stenosis. Vertebral arteries: Both vertebral arteries patent to the basilar without significant stenosis. Skeleton: Degenerative changes cervical spine. No acute skeletal abnormality. Other neck: Negative Upper chest: Lung apices clear bilaterally.  Left-sided pacemaker. Review of the MIP images confirms the above findings CTA HEAD FINDINGS Anterior circulation: Atherosclerotic calcification in the  cavernous carotid bilaterally causing mild stenosis. Right middle cerebral artery widely patent. Both anterior cerebral arteries widely patent. Left M1 widely patent. There is a branch occlusion left M3. There is flow past the short segment occlusion with decreased perfusion in this area. Probable additional distal emboli past the occlusion. This corresponds to an area of ischemia on CT perfusion. Posterior circulation: Mild atherosclerotic stenosis of the distal vertebral artery bilaterally. Right PICA patent. Left PICA not visualized. AICA patent bilaterally. Mild atherosclerotic disease in the basilar. Superior cerebellar and posterior cerebral arteries patent bilaterally. Fetal origin right posterior cerebral artery. Venous sinuses: Patent Anatomic variants: None Review of the MIP images confirms the above findings CT Brain Perfusion Findings: ASPECTS: Not scored due to intracranial hemorrhage CBF (<30%) Volume: 24mL Perfusion (Tmax>6.0s) volume: 60mL Mismatch Volume: 67mL Infarction Location:Left parietal lobe IMPRESSION: 1. Left convexity hemorrhage unchanged from earlier today. This appears to be a combination of left parafalcine subdural hematoma and subarachnoid hemorrhage. This is related to trauma given history of fall. No acute ischemic infarct on CT. 2. 50% diameter stenosis proximal right internal carotid artery. Left carotid artery patent without significant stenosis. Both vertebral arteries patent in the neck. 3. Segmental occlusion left M3 branch with trickle distal flow and additional clot distally. 4. CT perfusion demonstrates 9 mL of core infarct and 81 mL of additional ischemia in the left parietal lobe corresponding to the left M3 branch occlusion. 5. These results were called by telephone at the time of interpretation on 06/06/2019 at 6:44 pm to provider ERIC [facility] , who verbally acknowledged these results. Electronically Signed   By: Franchot Gallo M.D.   On: 06/06/2019 18:45   CT Code  Stroke Cerebral Perfusion with contrast  Result Date: 06/06/2019 CLINICAL DATA:  Stroke EXAM: CT PERFUSION BRAIN TECHNIQUE: Multiphase CT imaging of the brain was performed following IV bolus  contrast injection. Subsequent parametric perfusion maps were calculated using RAPID software. CONTRAST:  194mL OMNIPAQUE IOHEXOL 350 MG/ML SOLN COMPARISON:  CTA head 06/06/2019 FINDINGS: CT Brain Perfusion Findings: CBF (<30%) Volume: 37mL Perfusion (Tmax>6.0s) volume: 77mL Mismatch Volume: 52mL ASPECTS on noncontrast CT Head: Aspect score not calculated due to intracranial hemorrhage. Infarct Core: 9 mL Infarction Location:Left parietal lobe IMPRESSION: 9 mL of acute infarction left parietal lobe with 81 mL of surrounding penumbra. Electronically Signed   By: Franchot Gallo M.D.   On: 06/06/2019 19:10   DG Chest Port 1 View  Result Date: 06/07/2019 CLINICAL DATA:  CVA. EXAM: PORTABLE CHEST 1 VIEW COMPARISON:  06/02/2019 FINDINGS: The pacer wires/AICD are stable. Stable mild cardiac enlargement. Diffuse slightly nodular interstitial process in the lungs could suggest interstitial edema or interstitial pneumonitis. No focal infiltrates or effusions. IMPRESSION: Diffuse slightly nodular interstitial process in the lungs could suggest interstitial edema or interstitial pneumonitis. Electronically Signed   By: Marijo Sanes M.D.   On: 06/07/2019 08:32   DG Swallowing Func-Speech Pathology  Result Date: 06/07/2019 Objective Swallowing Evaluation: Type of Study: MBS-Modified Barium Swallow Study  Patient Details Name: KEYONTAE BALUYOT MRN: [id number] Date of Birth: 1940-04-29 Today's Date: 06/07/2019 Time: SLP Start Time (ACUTE ONLY): 1200 -SLP Stop Time (ACUTE ONLY): 1230 SLP Time Calculation (min) (ACUTE ONLY): 30 min Past Medical History: Past Medical History: Diagnosis Date . AICD (automatic cardioverter/defibrillator) present 01/17/2003  Medtronic Maximo 7232CX ICD, serial [id number] . Anemia 02-06-11  takes oral iron .  Arthritis   hands, knees . CAD (coronary artery disease) 2003  a. h/o MI and CABG in 2003. b. s/p DES to SVG-RPDA-RPLB in 08/2014. Marland Kitchen Cancer of sigmoid colon ([place]) 2012  a. s/p colon surgery. . Carotid bruit  . Chronic systolic CHF (congestive heart failure) (HCC)   a. EF 20% in 2014; b. 08/2017 Echo: EF 20-25%, diff HK, Gr3 DD. Triv AI. Mod MR. Sev dil LA. Mildly dil RV w/ mildly reduced RV fxn. Mildly dil RA. Mod TR. PASP 6mHg. Marland Kitchen Cough  . GERD (gastroesophageal reflux disease) 02-06-11 . HTN (hypertension)  . Hyperlipidemia  . Ischemic cardiomyopathy   a. EF 20% in 2014. (Master study EF >20%); b. 08/2017 Echo: EF 20-25%, diff HK. Gr3 DD. . LV (left ventricular) mural thrombus   a. 12/2012 Echo: EF 20% with mural thrombus No evidence of thrombus on 08/2017 echo. . Macular degeneration  . Myocardial infarct ([place])   2003 . PAF (paroxysmal atrial fibrillation) (HCC)   a. CHA2DS2VASc = 5-->Xarelto/Tikosyn. Past Surgical History: Past Surgical History: Procedure Laterality Date . CARDIAC CATHETERIZATION N/A 09/20/2014  Procedure: Left Heart Cath and Coronary Angiography;  Surgeon: Jettie Booze, MD; LAD 95%, D1 100%, CFX liner percent, OM 200%, OM 390%, RCA 90%, LIMA-LAD okay, SVG-OM 2-OM 3 minimal disease, SVG-RPDA-RPLB 100% between the RPDA and RPL    . CARDIAC CATHETERIZATION N/A 09/20/2014  Procedure: Coronary Stent Intervention;  Surgeon: Jettie Booze, MD; Synergy DES 4 x 24 mm reducing the stenosis to 5%  . CARDIAC DEFIBRILLATOR PLACEMENT  01/17/03  6949 lead. Medtronic. remote-no; with later revision . CARDIOVERSION N/A 05/22/2016  Procedure: CARDIOVERSION;  Surgeon: Lelon Perla, MD;  Location: [facility] ENDOSCOPY;  Service: Cardiovascular;  Laterality: N/A; . CARDIOVERSION N/A 08/10/2017  Procedure: CARDIOVERSION;  Surgeon: Pixie Casino, MD;  Location: [facility] ENDOSCOPY;  Service: Cardiovascular;  Laterality: N/A; . CATARACT EXTRACTION W/ INTRAOCULAR LENS  IMPLANT, BILATERAL Bilateral June/-July 2009  Dr.  Katy Fitch . COLON RESECTION  02/09/2011  Procedure: LAPAROSCOPIC SIGMOID COLON RESECTION;  Surgeon: Pedro Earls, MD;  Location: [facility] OR[facility];  Service: General;  Laterality: N/A;  Laparoscopic Assisted Sigmoid Colectomy . COLON SURGERY   . COLONOSCOPY  08/31/2011  Procedure: COLONOSCOPY;  Surgeon: Jerene Bears, MD;  Location: [facility] ENDOSCOPY;  Service: Gastroenterology;  Laterality: N/A; . COLONOSCOPY N/A 09/05/2012  Procedure: COLONOSCOPY;  Surgeon: Jerene Bears, MD;  Location: [facility] ENDOSCOPY;  Service: Gastroenterology;  Laterality: N/A; . COLONOSCOPY N/A 04/18/2013  Procedure: COLONOSCOPY;  Surgeon: Jerene Bears, MD;  Location: [facility] ENDOSCOPY;  Service: Gastroenterology;  Laterality: N/A; . COLONOSCOPY N/A 04/09/2014  Procedure: COLONOSCOPY;  Surgeon: Jerene Bears, MD;  Location: [facility] ENDOSCOPY;  Service: Gastroenterology;  Laterality: N/A; . COLONOSCOPY WITH PROPOFOL N/A 05/03/2017  Procedure: COLONOSCOPY WITH PROPOFOL;  Surgeon: Jerene Bears, MD;  Location: [facility] ENDOSCOPY;  Service: Gastroenterology;  Laterality: N/A; . CORONARY ANGIOPLASTY   . CORONARY ARTERY BYPASS GRAFT  01/2002  LIMA-LAD, SVG-OM 2-OM 3, SVG-RPDA-RPLB . CORONARY STENT INTERVENTION N/A 11/22/2018  Procedure: CORONARY STENT INTERVENTION;  Surgeon: Nelva Bush, MD;  Location: [place] [facility];  Service: Cardiovascular;  Laterality: N/A;  SVG - RCA . ICD GENERATOR CHANGE  2010  Medtronic Virtuoso II VR ICD . ICD GENERATOR CHANGEOUT N/A 12/07/2018  Procedure: ICD GENERATOR CHANGEOUT;  Surgeon: Deboraha Sprang, MD;  Location: [place] [facility];  Service: Cardiovascular;  Laterality: N/A; . INGUINAL HERNIA REPAIR Right 2000's X 2 . LAPAROSCOPIC RIGHT HEMI COLECTOMY N/A 11/04/2012  Procedure: LAPAROSCOPIC RIGHT HEMI COLECTOMY;  Surgeon: Pedro Earls, MD;  Location: [facility];  Service: General;  Laterality: N/A; . LEFT HEART CATH AND CORS/GRAFTS ANGIOGRAPHY Left 11/22/2018  Procedure: LEFT HEART CATH AND CORS/GRAFTS ANGIOGRAPHY;  Surgeon: Nelva Bush, MD;   Location: [place] [facility];  Service: Cardiovascular;  Laterality: Left; . TONSILLECTOMY  ~ 1950 HPI: Mark Clayton is a 79 y.o. male who was admitted 3/9 with traumatic ICH. PMH including but not limited to PAF on xarelto, MI, LV thrombus, ICM, HTN, HLD, CAD, CA of sigmoid colon s/p surgery (see "past medical history" for rest). CT (3/9) revealing a combination of left parafalcine subdural hematoma and subarachnoid hemorrhage. This is related to trauma given history of fall. No acute ischemic infarct on CT. Presents with Expressive > receptive apahsia. H/o GERD ad coughing.  Subjective: cooperative Assessment / Plan / Recommendation CHL IP CLINICAL IMPRESSIONS 06/07/2019 Clinical Impression Pt presents with oropharyngeal dysphagia across consistencies. Oral phase is remarkable for lingual residue. Pharyngeal phase is remarkable for reduced BOT retraction and epiglottic inversion resulting in vallecular and pyriform sinsus residue. Vallecular residue significantly increased with progression of advanced textures, and multiple strategies were attempted to reduce the amount, with the effortful swallow being the most effective. Trace penetration (PAS 3) occured x2 as the pt also utilized a liquid wash to reduce vallecular residue following advanced textures, however, it was not observed following any other textures. There was frequent coughing and throat clearing as seen at bedside, but was not related to aspiration. Given the penetration and amount of residue with advanced textures, recommend Dys 2 and thin liquids, utilizing liquid wash PRN, multiple hard swallows following each bite/sip, clearing throat intermittently. SLP Visit Diagnosis Dysphagia, oropharyngeal phase (R13.12) Attention and concentration deficit following -- Frontal lobe and executive function deficit following -- Impact on safety and function --   CHL IP TREATMENT RECOMMENDATION 06/07/2019 Treatment Recommendations Therapy as outlined in  treatment plan below   Prognosis 06/07/2019 Prognosis for Safe  Diet Advancement Good Barriers to Reach Goals -- Barriers/Prognosis Comment -- CHL IP DIET RECOMMENDATION 06/07/2019 SLP Diet Recommendations Dysphagia 2 (Fine chop) solids;Thin liquid Liquid Administration via Cup;Straw;Spoon Medication Administration Crushed with puree Compensations Minimize environmental distractions;Slow rate;Small sips/bites;Multiple dry swallows after each bite/sip;Follow solids with liquid;Effortful swallow Postural Changes Seated upright at 90 degrees;Remain semi-upright after after feeds/meals (Comment)   CHL IP OTHER RECOMMENDATIONS 06/07/2019 Recommended Consults -- Oral Care Recommendations Oral care BID Other Recommendations --   CHL IP FOLLOW UP RECOMMENDATIONS 06/07/2019 Follow up Recommendations Other (comment)   CHL IP FREQUENCY AND DURATION 06/07/2019 Speech Therapy Frequency (ACUTE ONLY) min 2x/week Treatment Duration 2 weeks      CHL IP ORAL PHASE 06/07/2019 Oral Phase Impaired Oral - Pudding Teaspoon -- Oral - Pudding Cup -- Oral - Honey Teaspoon -- Oral - Honey Cup -- Oral - Nectar Teaspoon -- Oral - Nectar Cup Lingual/palatal residue Oral - Nectar Straw -- Oral - Thin Teaspoon -- Oral - Thin Cup WFL Oral - Thin Straw WFL Oral - Puree WFL Oral - Mech Soft -- Oral - Regular Lingual/palatal residue Oral - Multi-Consistency -- Oral - Pill -- Oral Phase - Comment --  CHL IP PHARYNGEAL PHASE 06/07/2019 Pharyngeal Phase Impaired Pharyngeal- Pudding Teaspoon -- Pharyngeal -- Pharyngeal- Pudding Cup -- Pharyngeal -- Pharyngeal- Honey Teaspoon -- Pharyngeal -- Pharyngeal- Honey Cup -- Pharyngeal -- Pharyngeal- Nectar Teaspoon -- Pharyngeal -- Pharyngeal- Nectar Cup Pharyngeal residue - valleculae;Pharyngeal residue - pyriform;Reduced tongue base retraction;Compensatory strategies attempted (with notebox) Pharyngeal -- Pharyngeal- Nectar Straw -- Pharyngeal -- Pharyngeal- Thin Teaspoon -- Pharyngeal -- Pharyngeal- Thin Cup  Pharyngeal residue - valleculae;Pharyngeal residue - pyriform;Reduced tongue base retraction;Penetration/Aspiration during swallow Pharyngeal Material enters airway, remains ABOVE vocal cords then ejected out Pharyngeal- Thin Straw Pharyngeal residue - valleculae;Pharyngeal residue - pyriform;Reduced tongue base retraction Pharyngeal -- Pharyngeal- Puree Pharyngeal residue - pyriform;Pharyngeal residue - valleculae;Reduced tongue base retraction Pharyngeal -- Pharyngeal- Mechanical Soft -- Pharyngeal -- Pharyngeal- Regular Pharyngeal residue - valleculae;Pharyngeal residue - pyriform;Reduced tongue base retraction Pharyngeal -- Pharyngeal- Multi-consistency -- Pharyngeal -- Pharyngeal- Pill -- Pharyngeal -- Pharyngeal Comment --  CHL IP CERVICAL ESOPHAGEAL PHASE 06/07/2019 Cervical Esophageal Phase WFL Pudding Teaspoon -- Pudding Cup -- Honey Teaspoon -- Honey Cup -- Nectar Teaspoon -- Nectar Cup -- Nectar Straw -- Thin Teaspoon -- Thin Cup -- Thin Straw -- Puree -- Mechanical Soft -- Regular -- Multi-consistency -- Pill -- Cervical Esophageal Comment -- Osie Bond., M.A. [place] Pager [phone] Office [phone] 06/07/2019, 2:23 PM              ECHOCARDIOGRAM COMPLETE  Result Date: 06/07/2019    ECHOCARDIOGRAM REPORT   Patient Name:   Mark Clayton Date of Exam: 06/07/2019 Medical Rec #:  [id number]      Height:       72.0 in Accession #:    [id number]     Weight:       159.8 lb Date of Birth:  1940/09/13      BSA:          1.937 m Patient Age:    89 years       BP:           139/85 mmHg Patient Gender: M              HR:           91 bpm. Exam Location:  Inpatient Procedure: 2D Echo and Intracardiac Opacification Agent Indications:    Stroke  434.91 / I163.9  History:        Patient has prior history of Echocardiogram examinations, most                 recent 09/03/2017. CAD and Previous Myocardial Infarction,                 Defibrillator, Arrythmias:Atrial Fibrillation,                  Signs/Symptoms:Chest Pain; Risk Factors:Hypertension. Ischemic                 cardiomyopathy.  Sonographer:    Darlina Sicilian RDCS Referring Phys: [id number] [place]  1. No mural thrombus with Definity contrast. Left ventricular ejection fraction, by estimation, is <20%. The left ventricle has severely decreased function. The left ventricle demonstrates global hypokinesis with regional variation. The left ventricular  internal cavity size was moderately to severely dilated.  2. Right ventricular systolic function is moderately reduced. The right ventricular size is mildly enlarged. A AICD wire is visualized. There is moderately elevated pulmonary artery systolic pressure.  3. Left atrial size was moderately dilated.  4. The mitral valve is abnormal. Mild mitral valve regurgitation.  5. The aortic valve is tricuspid. Aortic valve regurgitation is mild. Mild aortic valve sclerosis is present, with no evidence of aortic valve stenosis.  6. The inferior vena cava is dilated in size with <50% respiratory variability, suggesting right atrial pressure of 15 mmHg. Comparison(s): Changes from prior study are noted. 09/03/2017: LVEF 20-25%. FINDINGS  Left Ventricle: No mural thrombus with Definity contrast. Left ventricular ejection fraction, by estimation, is <20%. The left ventricle has severely decreased function. The left ventricle demonstrates global hypokinesis. Definity contrast agent was given IV to delineate the left ventricular endocardial borders. The left ventricular internal cavity size was moderately to severely dilated. There is no left ventricular hypertrophy. Left ventricular diastolic parameters are indeterminate. Right Ventricle: The right ventricular size is mildly enlarged. No increase in right ventricular wall thickness. Right ventricular systolic function is moderately reduced. There is moderately elevated pulmonary artery systolic pressure. The tricuspid regurgitant velocity is 2.82  m/s, and with an assumed right atrial pressure of 15 mmHg, the estimated right ventricular systolic pressure is [id number]. Left Atrium: Left atrial size was moderately dilated. Right Atrium: Right atrial size was normal in size. Pericardium: There is no evidence of pericardial effusion. Mitral Valve: The mitral valve is abnormal. There is mild thickening of the mitral valve leaflet(s). Mild mitral valve regurgitation. Tricuspid Valve: The tricuspid valve is grossly normal. Tricuspid valve regurgitation is mild. Aortic Valve: The aortic valve is tricuspid. Aortic valve regurgitation is mild. Mild aortic valve sclerosis is present, with no evidence of aortic valve stenosis. Pulmonic Valve: The pulmonic valve was grossly normal. Pulmonic valve regurgitation is trivial. Aorta: The aortic root, ascending aorta, aortic arch and descending aorta are all structurally normal, with no evidence of dilitation or obstruction. Venous: The inferior vena cava is dilated in size with less than 50% respiratory variability, suggesting right atrial pressure of 15 mmHg. IAS/Shunts: No atrial level shunt detected by color flow Doppler. Additional Comments: A AICD wire is visualized.  LEFT VENTRICLE PLAX 2D LVIDd:         6.93 cm LVIDs:         6.15 cm LV PW:         1.01 cm LV IVS:        1.06 cm LVOT diam:  1.90 cm LV SV:         32 LV SV Index:   17 LVOT Area:     2.84 cm  LV Volumes (MOD) LV vol d, MOD A2C: 268.0 ml LV vol d, MOD A4C: 307.5 ml LV vol s, MOD A2C: 210.0 ml LV vol s, MOD A4C: 223.0 ml LV SV MOD A2C:     58.0 ml LV SV MOD A4C:     307.5 ml LV SV MOD BP:      67.7 ml RIGHT VENTRICLE RV S prime:     7.67 cm/s LEFT ATRIUM             Index       RIGHT ATRIUM           Index LA diam:        5.30 cm 2.74 cm/m  RA Area:     24.40 cm LA Vol (A2C):   65.4 ml 33.76 ml/m RA Volume:   77.10 ml  39.81 ml/m LA Vol (A4C):   84.8 ml 43.78 ml/m LA Biplane Vol: 77.2 ml 39.86 ml/m  AORTIC VALVE LVOT Vmax:   83.30 cm/s LVOT Vmean:   48.200 cm/s LVOT VTI:    0.113 m  AORTA Ao Root diam: 3.40 cm Ao Asc diam:  3.50 cm MITRAL VALVE                TRICUSPID VALVE MV Area (PHT): 5.68 cm     TR Peak grad:   31.8 mmHg MV Decel Time: 134 msec     TR Vmax:        282.00 cm/s MV E velocity: 125.50 cm/s                             SHUNTS                             Systemic VTI:  0.11 m                             Systemic Diam: 1.90 cm Lyman Bishop MD Electronically signed by Lyman Bishop MD Signature Date/Time: 06/07/2019/4:27:13 PM    Final    CT HEAD CODE STROKE WO CONTRAST  Result Date: 06/06/2019 CLINICAL DATA:  Code stroke. Fell and hit head with right-sided weakness and slurred speech. EXAM: CT HEAD WITHOUT CONTRAST TECHNIQUE: Contiguous axial images were obtained from the base of the skull through the vertex without intravenous contrast. COMPARISON:  07/03/2013 FINDINGS: There is motion artifact through the skull base. Brain: There is a focal collection of acute extra-axial hemorrhage in the left interhemispheric fissure at the vertex measuring 3.3 x 1.9 cm which may be subdural or subarachnoid. Additional small volume subdural or subarachnoid hemorrhage is also present more inferiorly and anteriorly in the interhemispheric fissure to the left of midline. Scattered small areas of hyperattenuation along the right greater than left anterior frontal lobes may represent artifact versus a small amount of additional subarachnoid hemorrhage. No acute infarct or midline shift is evident. There is mild cerebral atrophy. Vascular: Calcified atherosclerosis at the skull base. No hyperdense vessel. Skull: No fracture or suspicious osseous lesion. Sinuses/Orbits: Small right maxillary sinus. Clear paranasal sinuses and mastoid air cells. Bilateral cataract extraction. Other: Mild posterior scalp swelling/small hematoma. ASPECTS [facility] Stroke Program Early CT Score) Not scored due to the  presence of hemorrhage. IMPRESSION: 1. Extra-axial hemorrhage in  the left interhemispheric fissure as above. No significant mass effect. 2. No acute infarct identified. These results were communicated to Dr. Cheral Marker at 4:02 pm on 06/06/2019 by text page via the [facility] messaging system. Electronically Signed   By: Logan Bores M.D.   On: 06/06/2019 16:04    Assessment/Plan: Patient was admitted on 06/06/2019 with extreme right-sided weakness and speech difficulty. CT scan on admission revealed a left parasagittal lobar hemorrhage. He was on Eliquis for atrial fibrillation. Anticoagulation was reversed in the emergency department. Follow up scan on 06/07/2019 revealed an acute infarct in the left frontal parietal lobe. Hemorrhage appears stable.   LOS: 2 days    -No Neurosurgical intervention needed. Neurosurgery will sign off. Please re-consult [place] if we can be of further assistance.   Viona Gilmore, DNP, AGNP-C Nurse Practitioner  [facility] [place] [address] [place], [place], [place] [address] P: [phone]    F: [phone]  06/08/2019, 1:50 PM

## 2019-06-08 NOTE — Progress Notes (Signed)
Rehab Admissions Coordinator Note:  Per PT recommendation, atient was screened by Michel Santee for appropriateness for an Inpatient Acute Rehab Consult.  At this time, we are recommending Inpatient Rehab consult.  I will place an order per protocol.   Michel Santee 06/08/2019, 11:53 AM  I can be reached at [phone].

## 2019-06-09 LAB — COMPREHENSIVE METABOLIC PANEL
ALT: 16 U/L (ref 0–44)
AST: 31 U/L (ref 15–41)
Albumin: 3 g/dL — ABNORMAL LOW (ref 3.5–5.0)
Alkaline Phosphatase: 38 U/L (ref 38–126)
Anion gap: 14 (ref 5–15)
BUN: 22 mg/dL (ref 8–23)
CO2: 24 mmol/L (ref 22–32)
Calcium: 9.1 mg/dL (ref 8.9–10.3)
Chloride: 104 mmol/L (ref 98–111)
Creatinine, Ser: 1.12 mg/dL (ref 0.61–1.24)
GFR calc Af Amer: >60 mL/min (ref >60)
GFR calc non Af Amer: >60 mL/min (ref >60)
Glucose, Bld: 114 mg/dL — ABNORMAL HIGH (ref 70–99)
Potassium: 3.6 mmol/L (ref 3.5–5.1)
Sodium: 142 mmol/L (ref 135–145)
Total Bilirubin: 1.9 mg/dL — ABNORMAL HIGH (ref 0.3–1.2)
Total Protein: 5.7 g/dL — ABNORMAL LOW (ref 6.5–8.1)

## 2019-06-09 MED ORDER — LACTATED RINGERS IV SOLN: INTRAVENOUS | Status: DC

## 2019-06-09 MED ORDER — POLYETHYLENE GLYCOL 3350 17 G PO PACK
17.0000 g | PACK | Freq: Every day | ORAL | Status: DC
  Administered 2019-06-09 – 2019-06-13 (×5): 17 g via ORAL
  Filled 2019-06-09 (×6): qty 1

## 2019-06-09 NOTE — Progress Notes (Signed)
Occupational Therapy Treatment Patient Details Name: Mark Clayton MRN: [id number] DOB: 12/09/1940 Today's Date: 06/09/2019    History of present illness Mark Clayton is a 79 y.o. male who was admitted 3/9 with traumatic ICH after falling backwards onto concrete. PMH including PAF on xarelto, MI, LV thrombus, ICM, HTN, HLD, CAD, CA of sigmoid colon s/p surgery. CT (3/9) revealing a combination of left parafalcine subdural hematoma and subarachnoid hemorrhage. No acute ischemic infarct on CT.    OT comments  Pt. Seen with PT for skilled therapy session.  Focus of session bed mobility with eob sitting, incorporation of sitting balance then sit/stand with stedy for attempted transfer to chair. Pt. With c/o dizziness associated with positional changes.  Assisted to supine position from eob. Then took series of orthostatic BP. (see below for details).  Pt. Assisted back to bed with c/o dizziness and not feeling well. Bed placed in seated position.  Will continue with states OT goals as pt. Able. Remains excellent CIR candidate as he is very motivated and expresses desire to return to his previous way of living including stated abilities to complete ADLs and leisure activities.   SUPINE: 149/87-105 SIT: 145/92-107 PARTIAL STAND/STEDY: [id number] SUPINE: 145/87-101   Follow Up Recommendations  CIR    Equipment Recommendations       Recommendations for Other Services Rehab consult    Precautions / Restrictions Precautions Precautions: Fall;Other (comment) Precaution Comments: R hemiparesis, check ortho Restrictions Weight Bearing Restrictions: No       Mobility Bed Mobility Overal bed mobility: Needs Assistance Bed Mobility: Supine to Sit;Sit to Supine     Supine to sit: Max assist;+2 for physical assistance Sit to supine: Max assist;+2 for physical assistance   General bed mobility comments: Ma x+2 for supine to sit and sit to supine.  He continues to require assistance to  advance RLE into and out of the bed and to elevate trunk into a seated position.  Once in sitting presents with strong posterior push to the R with max VCs and use of bed rail to maintain sitting edge of bed..  Pt reports dizziness sitting edge of bed this session.  Pt returned to supine position and vital obtained.  Transfers Overall transfer level: Needs assistance Equipment used: Ambulation equipment used Transfers: Sit to/from Stand Sit to Stand: +2 physical assistance;Max assist         General transfer comment: Pt with zero activation of R quad he required assistance to achieve hip and knee extension in sara stedy.  Pt continues to push to the R.    Balance Overall balance assessment: Needs assistance Sitting-balance support: Feet supported Sitting balance-Leahy Scale: Zero Sitting balance - Comments: zero to poor when fatigued unable to maintain sitting without adequate support. Postural control: Right lateral lean                                 ADL either performed or assessed with clinical judgement   ADL Overall ADL's : Needs assistance/impaired     Grooming: Maximal assistance;Total assistance Grooming Details (indicate cue type and reason): simulated while pt. seated eob to determine is able to use LUE for simulated grooming task in unsupported sitting.  pt. unable to maintain sitting balance to perform without lob  Vision       Perception     Praxis      Cognition Arousal/Alertness: Awake/alert Behavior During Therapy: WFL for tasks assessed/performed Overall Cognitive Status: Difficult to assess Area of Impairment: Following commands;Memory;Attention;Safety/judgement;Awareness                   Current Attention Level: Focused;Sustained Memory: Decreased short-term memory Following Commands: Follows one step commands inconsistently Safety/Judgement: Decreased awareness of  safety;Decreased awareness of deficits Awareness: Intellectual            Exercises     Shoulder Instructions       General Comments      Pertinent Vitals/ Pain       Pain Assessment: Faces Pain Score: 0-No pain Faces Pain Scale: No hurt  Home Living                                          Prior Functioning/Environment              Frequency  Min 2X/week        Progress Toward Goals  OT Goals(current goals can now be found in the care plan section)  Progress towards OT goals: Progressing toward goals  Acute Rehab OT Goals Patient Stated Goal: none stated; pt granddaughter would like him to go to rehab  Plan      Co-evaluation    PT/OT/SLP Co-Evaluation/Treatment: Yes Reason for Co-Treatment: Complexity of the patient's impairments (multi-system involvement) PT goals addressed during session: Mobility/safety with mobility OT goals addressed during session: ADL's and self-care      AM-PAC OT "6 Clicks" Daily Activity     Outcome Measure   Help from another person eating meals?: A Little Help from another person taking care of personal grooming?: A Little Help from another person toileting, which includes using toliet, bedpan, or urinal?: Total Help from another person bathing (including washing, rinsing, drying)?: A Lot Help from another person to put on and taking off regular upper body clothing?: A Lot Help from another person to put on and taking off regular lower body clothing?: Total 6 Click Score: 12    End of Session    OT Visit Diagnosis: Muscle weakness (generalized) (M62.81);History of falling (Z91.81);Hemiplegia and hemiparesis Hemiplegia - Right/Left: Right Hemiplegia - dominant/non-dominant: Dominant   Activity Tolerance Patient tolerated treatment well   Patient Left in bed;with call bell/phone within reach;with bed alarm set   Nurse Communication          Time: [id number] OT Time Calculation (min): 26  min  Charges: OT General Charges $OT Visit: 1 Visit OT Treatments $Therapeutic Activity: 8-22 mins  Sonia Baller, COTA/L Acute Rehabilitation [phone]   Janice Coffin 06/09/2019, 12:15 PM

## 2019-06-09 NOTE — Plan of Care (Signed)

## 2019-06-09 NOTE — Plan of Care (Signed)

## 2019-06-09 NOTE — Progress Notes (Signed)
Physical Therapy Treatment Patient Details Name: Mark Clayton MRN: [id number] DOB: 07-16-1940 Today's Date: 06/09/2019    History of Present Illness DAVIDMICHAEL SPRINGS is a 79 y.o. male who was admitted 3/9 with traumatic ICH after falling backwards onto concrete. PMH including PAF on xarelto, MI, LV thrombus, ICM, HTN, HLD, CAD, CA of sigmoid colon s/p surgery. CT (3/9) revealing a combination of left parafalcine subdural hematoma and subarachnoid hemorrhage. No acute ischemic infarct on CT.     PT Comments    Pt supine in bed on arrival.  He reports he had a recent coughing spell and feels short of breath.  He was able to move L side to edge of bed but required max-total assistance for R side to edge of bed.  Once in sitting presents with strong lateral push to the R.  Pt presents weaker from previous session as he was unable to activate R quad in standing.  Obatined BPs listed below and informed RN and MD.  BP: Supine after c/o dizziness in sitting: 149/87 Sitting: 145/92 Stand but unable to stand for entire reading so ended in sitting: 133/81 Supine post session: 145/87   Follow Up Recommendations  CIR;Supervision/Assistance - 24 hour     Equipment Recommendations  Wheelchair (measurements PT);Wheelchair cushion (measurements PT)    Recommendations for Other Services       Precautions / Restrictions Precautions Precautions: Fall;Other (comment) Precaution Comments: R hemiparesis, check ortho Restrictions Weight Bearing Restrictions: No    Mobility  Bed Mobility Overal bed mobility: Needs Assistance Bed Mobility: Supine to Sit;Sit to Supine     Supine to sit: Max assist;+2 for physical assistance Sit to supine: Max assist;+2 for physical assistance   General bed mobility comments: Ma x+2 for supine to sit and sit to supine.  He continues to require assistance to advance RLE into and out of the bed and to elevate trunk into a seated position.  Once in sitting presents  with strong posterior push to the R with max VCs and use of bed rail to maintain sitting edge of bed..  Pt reports dizziness sitting edge of bed this session.  Pt returned to supine position and vital obtained.  Transfers Overall transfer level: Needs assistance Equipment used: Ambulation equipment used(sara stedy) Transfers: Sit to/from Stand Sit to Stand: +2 physical assistance;Max assist         General transfer comment: Pt with zero activation of R quad he required assistance to achieve hip and knee extension in sara stedy.  Pt continues to push to the R.  Ambulation/Gait Ambulation/Gait assistance: (NT)               Stairs             Wheelchair Mobility    Modified Rankin (Stroke Patients Only)       Balance Overall balance assessment: Needs assistance Sitting-balance support: Feet supported Sitting balance-Leahy Scale: Zero Sitting balance - Comments: zero to poor when fatigued unable to maintain sitting without adequate support. Postural control: Right lateral lean                                  Cognition Arousal/Alertness: Awake/alert Behavior During Therapy: WFL for tasks assessed/performed Overall Cognitive Status: Difficult to assess Area of Impairment: Following commands;Memory;Attention;Safety/judgement;Awareness                   Current Attention Level: Focused;Sustained Memory: Decreased  short-term memory Following Commands: Follows one step commands inconsistently Safety/Judgement: Decreased awareness of safety;Decreased awareness of deficits Awareness: Intellectual          Exercises      General Comments        Pertinent Vitals/Pain Pain Assessment: Faces Faces Pain Scale: No hurt    Home Living                      Prior Function            PT Goals (current goals can now be found in the care plan section) Acute Rehab PT Goals Patient Stated Goal: none stated; pt granddaughter would  like him to go to rehab Potential to Achieve Goals: Fair Progress towards PT goals: Progressing toward goals    Frequency    Min 4X/week      PT Plan Current plan remains appropriate    Co-evaluation PT/OT/SLP Co-Evaluation/Treatment: Yes Reason for Co-Treatment: Complexity of the patient's impairments (multi-system involvement) PT goals addressed during session: Mobility/safety with mobility OT goals addressed during session: ADL's and self-care      AM-PAC PT "6 Clicks" Mobility   Outcome Measure  Help needed turning from your back to your side while in a flat bed without using bedrails?: Total Help needed moving from lying on your back to sitting on the side of a flat bed without using bedrails?: Total Help needed moving to and from a bed to a chair (including a wheelchair)?: Total Help needed standing up from a chair using your arms (e.g., wheelchair or bedside chair)?: Total Help needed to walk in hospital room?: Total Help needed climbing 3-5 steps with a railing? : Total 6 Click Score: 6    End of Session Equipment Utilized During Treatment: Gait belt Activity Tolerance: Patient tolerated treatment well Patient left: in bed;with call bell/phone within reach;with bed alarm set Nurse Communication: Mobility status PT Visit Diagnosis: Other symptoms and signs involving the nervous system (R29.898);Hemiplegia and hemiparesis;Other abnormalities of gait and mobility (R26.89) Hemiplegia - Right/Left: Right Hemiplegia - dominant/non-dominant: Dominant     Time: [id number] PT Time Calculation (min) (ACUTE ONLY): 26 min  Charges:  $Therapeutic Activity: 8-22 mins                     Erasmo Leventhal , PTA Acute Rehabilitation Services Pager [phone] Office [phone]     Salena Ortlieb Eli Hose 06/09/2019, 11:02 AM

## 2019-06-09 NOTE — Progress Notes (Signed)
STROKE TEAM PROGRESS NOTE   INTERVAL HISTORY Pt lying in bed. Still has dense right hemiplegia, mild aphasia is improving. Still has intermittent chocking with water. Pt stated that her daughter will be back from [place] tomorrow. PT/OT recommend CIR   Vitals:   06/08/19 2328 06/09/19 0318 06/09/19 0342 06/09/19 0736  BP: (!) 132/94 128/87  (!) 148/90  Pulse: 75 80  87  Resp: 16 17  17   Temp: 98.4 F (36.9 C) 97.9 F (36.6 C)  97.8 F (36.6 C)  TempSrc: Oral Oral  Oral  SpO2: 98% 97%  95%  Weight:   74.9 kg     CBC:  Recent Labs  Lab 06/02/19 1917 06/06/19 1549 06/06/19 1551 06/07/19 0540 06/08/19 0509  WBC 6.2 5.9   < > 9.8 8.3  NEUTROABS 3.6 3.5  --   --   --   HGB 11.7* 12.2*   < > 11.6* 12.0*  HCT 36.4* 37.6*   < > 35.4* 36.6*  MCV 86 88.3   < > 86.8 88.4  PLT 170 176   < > 177 163   < > = values in this interval not displayed.    Basic Metabolic Panel:  Recent Labs  Lab 06/07/19 0540 06/07/19 0540 06/08/19 0509 06/09/19 0448  NA 144   < > 141 142  K 3.1*   < > 3.4* 3.6  CL 111   < > 108 104  CO2 23   < > 23 24  GLUCOSE 125*   < > 134* 114*  BUN 10   < > 14 22  CREATININE 1.01   < > 1.20 1.12  CALCIUM 8.9   < > 8.9 9.1  MG 1.9  --  2.2  --   PHOS 1.8*  --  3.0  --    < > = values in this interval not displayed.   Lipid Panel:     Component Value Date/Time   CHOL 121 06/07/2019 0540   CHOL 182 11/16/2018 0900   TRIG 112 06/07/2019 0540   HDL 39 (L) 06/07/2019 0540   HDL 56 11/16/2018 0900   CHOLHDL 3.1 06/07/2019 0540   VLDL 22 06/07/2019 0540   LDLCALC 60 06/07/2019 0540   LDLCALC 102 (H) 11/16/2018 0900   HgbA1c:  Lab Results  Component Value Date   HGBA1C 6.1 (H) 06/07/2019   Urine Drug Screen:     Component Value Date/Time   LABOPIA NONE DETECTED 06/07/2019 1400   COCAINSCRNUR NONE DETECTED 06/07/2019 1400   LABBENZ NONE DETECTED 06/07/2019 1400   AMPHETMU NONE DETECTED 06/07/2019 1400   THCU NONE DETECTED 06/07/2019 1400   LABBARB NONE DETECTED 06/07/2019 1400    Alcohol Level     Component Value Date/Time   ETH <10 06/06/2019 1549    IMAGING past 24 hours No results found.  PHYSICAL EXAM   Temp:  [97.5 F (36.4 C)-98.4 F (36.9 C)] 97.8 F (36.6 C) (03/12 0736) Pulse Rate:  [75-100] 87 (03/12 0736) Resp:  [16-18] 17 (03/12 0736) BP: (128-148)/(86-105) 148/90 (03/12 0736) SpO2:  [95 %-98 %] 95 % (03/12 0736) Weight:  [74.9 kg] 74.9 kg (03/12 0342)  General - Well nourished, well developed, in no apparent distress.  Ophthalmologic - fundi not visualized due to noncooperation.  Cardiovascular - irregularly irregular heart rate and rhythm.  Neuro - awake alert and orientated to place, people and age, month but not to year. Following simple commands, however, mild expressive aphasia with paraphasic errors. Difficulty  with repeating sentences and naming. No gaze preference, no nystagmus, PERRL, EOMI. Visual field full. Right facial droop. Right dense hemiplegia, 1/5 on pain stimulation. Left UE and LE 5/5. DTR diminished on the right, no babinski. Sensation subjectively symmetric, coordination intact on the left FTN. Gait not able to test.    ASSESSMENT/PLAN Mr. MOZIAH BAUGUESS is a 79 y.o. male with history of  PAF on Xarelto, myocardial infarct, hyperlipidemia, hypertension, congestive heart failure, carotid bruit, CAD along with AICD presenting after working in the yard where he fell backwards on a concrete driving and developed R hemiparesis, dysarthria. BP stable. CT showed hemorrhage. Xarelto reversed. Not sent to IR from ED given distal clot.   Stroke: L MCA infarct with L M2 occlusion, embolic secondary to known AF and severe cardiomyopathy even on Xarelto  Traumatic left vertex SDH due to fall with stroke  Xarelto reversed with Kcentra   NSG (Pool) consulted. No role for surgical intervention.  Code Stroke CT head extra-axial L interhemispheric hemorrhage  CTA head & neck L convexity  hemorrhage (L parafalcine SDH and SAH) d/t trauma. Proximal R ICA 50% stenosis. L M3 branch occlusion w/ trickle flow and distal clot.   CT perfusion 68mL L parietal lobe infarct w/ 63mL penumbra  MRI not able to perform due to AICD lead not compatible   CT repeat parafalcine SDH stable. L convexity SAH and SDH stable. Interval new L frontoparietal infarct.  2D Echo EF <20% no mural thrombus  LDL 60   HgbA1c 6.1   SCDs for VTE prophylaxis  clopidogrel 75 mg daily and Xarelto (rivaroxaban) daily prior to admission, now on No antithrombotic given hemorrhage. Consider to resume anticoagulation in 2-3 weeks if SDH resolving and clinically stable.  Therapy recommendations:  CIR  Disposition:  pending   Atrial Fibrillation  Home anticoagulation:  Xarelto (rivaroxaban) daily  . tikosyn 250 bid and coreg (as below)  . Currently on no AC due to SDH . Rate in control  Consider to resume anticoagulation in 2-3 weeks if SDH resolving and clinically stable.   Hypertension  Home meds:  Coreg 25 q hs, lasix 40, isosorbide 30, losartan 50  Now on coreg 12.5 bid, lasix 40, imdur 30  Stable 130-150 . Treated with Cleviprex gtt in the ICU, now off . SBP goal < 160  . Long-term BP goal normotensive  Hyperlipidemia  Home meds:  lipitor 80 and zetia 10, now resumed  LDL 60, goal < 70  Continue statin at discharge  Dysphagia . Secondary to stroke . Cleared for D2 thin liquids . Speech on board . Still has intermittent chocking with water  Other Stroke Risk Factors  Advanced age  ETOH use, alcohol level <10, advised to drink no more than 2 drink(s) a day  Coronary artery disease s/p MI  Chronic systolic Congestive heart failure  Ischemic cardiomyopathy   Hx LV mural thrombus  AICD [employer]  Other Active Problems  Nausea and vomiting d/t hemorrhage, prn zofran  Hypokalemia 3.1 - 3.4 - 3.6  resolved  Hx colon cancer s/p R hemicolectomy in 8/14, pt high risk for  recurrent [facility] day # 3  Neurology will sign off. Please call with questions. Pt will follow up with stroke clinic NP at [facility] in about 4 weeks. Thanks for the consult.   Rosalin Hawking, MD PhD Stroke Neurology 06/09/2019 10:10 AM   To contact Stroke Continuity provider, please refer to [email or web address] After hours, contact General Neurology

## 2019-06-09 NOTE — Progress Notes (Signed)
PROGRESS NOTE    Mark Clayton  [id number] DOB: Jan 19, 1941 DOA: 06/06/2019 PCP: [name], MD    Brief Narrative:  [phone] with hx PAF on xarelto, MI, LV thrombus, ICM, HTN, HLD, CAD, sigmoid colon CA s/p surgery presented after falling backwards and hitting his head on the concrete, found to have acute extra-axial hemorrhage on L on CT head. Patient was initially admitted to ICU and received KCentra to reverse xarelto. Neurosurgery followed with recommendation for no surgical intervention. During workup, patient was found to have acute L MCA infarct. Stroke team consulted. Patient since transferred to hospitalist service 3/10.   Assessment & Plan:   Active Problems:   ICH (intracerebral hemorrhage) ([place])  1. ICH 1. Post-traumatic after fall and hitting concrete 2. Had been followed by Neurosurgery with no recommendation for surgical intervention, since signing off as of 3/11 3. Anticoagulation remains on hold at this time 2. Acute L MCA CVA 1. Stroke team following 2. Unable to perform MRI secondary to AICD not compatible with MRI 3. Cont to hold anticoagulation per above 4. LDL 60 with A1c of 6.1 5. Defer resumption of anticoagulation to Neurology 6. Therapy recommending CIR, seen by CIR staff with insurance authorization currently in process 3. CAD 1. Denies chest pain or sob this AM 2. S/p prior CABG and ICD placement 3. Cath on 8/25 with findings of severe native CAD noted s/p PCI 4. Currently remains off anticoagulation and antiplatelet per above  4. PAF 1. Currently rate controlled 2. Continued on coreg 3. Resume anticoagulation when OK with Neurology 5. HTN 1. BP remains stable at present 2. Cont current regimen and titrate as tolerated 6. HLD 1. LD of 60 2. Continued on statin 7. Hx colon cancer 1. S/p sigmoidectomy as well as later R hemicolectomy in 8/14 2. Followed by GI Velora Heckler) as well as General Surgery 3. Chart reviewed, noted to be extremely  high risk for future colon polyps 4. Seems stable at present 8. Hx LV thrombus 1. Seems stable currently 2. Per above, plan anticoagulation when OK with Neurology 9. Dehydration 1. Urine appears concentrated, mucus membranes are dry, PT reports decreased exercise tolerance today 2. Will start LR at 75cc/hr  DVT prophylaxis: SCD's Code Status: Full Family Communication: Pt in room, family at bedside Disposition Plan: From home, plan CIR when insurance authorization clears  Consultants:   PCCM  Neurosurgery  Neurology  CIR  Procedures:     Antimicrobials: Anti-infectives (From admission, onward)   None      Subjective: Feeling weaker today  Objective: Vitals:   06/09/19 0342 06/09/19 0736 06/09/19 1154 06/09/19 1522  BP:  (!) 148/90 121/79 (!) 141/94  Pulse:  87 85 81  Resp:  17 18 17   Temp:  97.8 F (36.6 C) 98 F (36.7 C) 97.9 F (36.6 C)  TempSrc:  Oral Oral Oral  SpO2:  95% 93% 95%  Weight: 74.9 kg       Intake/Output Summary (Last 24 hours) at 06/09/2019 1540 Last data filed at 06/08/2019 1830 Gross per 24 hour  Intake 80 ml  Output 450 ml  Net -370 ml   Filed Weights   06/06/19 2011 06/08/19 0436 06/09/19 0342  Weight: 72.5 kg 77.9 kg 74.9 kg    Examination: General exam: Conversant, in no acute distress Respiratory system: normal chest rise, clear, no audible wheezing Cardiovascular system: regular rhythm, s1-s2 Gastrointestinal system: Nondistended, nontender, pos BS Central nervous system: No seizures, no tremors Extremities: No cyanosis, no  joint deformities Skin: No rashes, no pallor Psychiatry: Affect normal // no auditory hallucinations .   Data Reviewed: I have personally reviewed following labs and imaging studies  CBC: Recent Labs  Lab 06/02/19 1917 06/06/19 1549 06/06/19 1551 06/07/19 0540 06/08/19 0509  WBC 6.2 5.9  --  9.8 8.3  NEUTROABS 3.6 3.5  --   --   --   HGB 11.7* 12.2* 12.2* 11.6* 12.0*  HCT 36.4* 37.6*  36.0* 35.4* 36.6*  MCV 86 88.3  --  86.8 88.4  PLT 170 176  --  177 [id number]   Basic Metabolic Panel: Recent Labs  Lab 06/02/19 1917 06/02/19 1917 06/06/19 1549 06/06/19 1551 06/07/19 0540 06/08/19 0509 06/09/19 0448  NA 145*  --  141 142 144 141 142  K 3.6   < > 3.6 3.5 3.1* 3.4* 3.6  CL 105   < > 107 106 111 108 104  CO2 27  --  25  --  23 23 24   GLUCOSE 97  --  133* 124* 125* 134* 114*  BUN 12  --  13 12 10 14 22   CREATININE 1.10   < > 1.27* 1.20 1.01 1.20 1.12  CALCIUM 9.0  --  9.1  --  8.9 8.9 9.1  MG  --   --   --   --  1.9 2.2  --   PHOS  --   --   --   --  1.8* 3.0  --    < > = values in this interval not displayed.   GFR: Estimated Creatinine Clearance: 57.6 mL/min (by C-G formula based on SCr of 1.12 mg/dL). Liver Function Tests: Recent Labs  Lab 06/06/19 1549 06/09/19 0448  AST 17 31  ALT 11 16  ALKPHOS 49 38  BILITOT 1.2 1.9*  PROT 6.2* 5.7*  ALBUMIN 3.6 3.0*   No results for input(s): LIPASE, AMYLASE in the last 168 hours. No results for input(s): AMMONIA in the last 168 hours. Coagulation Profile: Recent Labs  Lab 06/06/19 1549  INR 1.1   Cardiac Enzymes: No results for input(s): CKTOTAL, CKMB, CKMBINDEX, TROPONINI in the last 168 hours. BNP (last 3 results) Recent Labs    08/26/18 0856 03/13/19 1028  PROBNP 382.0* 410.0*   HbA1C: Recent Labs    06/07/19 0540  HGBA1C 6.1*   CBG: Recent Labs  Lab 06/06/19 1542  GLUCAP 119*   Lipid Profile: Recent Labs    06/07/19 0540  CHOL 121  HDL 39*  LDLCALC 60  TRIG 112  CHOLHDL 3.1   Thyroid Function Tests: No results for input(s): TSH, T4TOTAL, FREET4, T3FREE, THYROIDAB in the last 72 hours. Anemia Panel: No results for input(s): VITAMINB12, FOLATE, FERRITIN, TIBC, IRON, RETICCTPCT in the last 72 hours. Sepsis Labs: No results for input(s): PROCALCITON, LATICACIDVEN in the last 168 hours.  Recent Results (from the past 240 hour(s))  Novel Coronavirus, NAA ([facility])     Status: None     Collection Time: 06/02/19  7:17 PM   Specimen: Nasopharyngeal(NP) swabs in vial transport medium   NASOPHARYNGE  IS THIS  Result Value Ref Range Status   SARS-CoV-2, NAA Not Detected Not Detected Final    Comment: Testing was performed using the cobas(R) SARS-CoV-2 test. This nucleic acid amplification test was developed and its performance characteristics determined by [employer]. Nucleic acid amplification tests include RT-PCR and TMA. This test has not been FDA cleared or approved. This test has been authorized by FDA under an Emergency Use  Authorization (EUA). This test is only authorized for the duration of time the declaration that circumstances exist justifying the authorization of the emergency use of in vitro diagnostic tests for detection of SARS-CoV-2 virus and/or diagnosis of COVID-19 infection under section 564(b)(1) of the Act, 21 U.S.C. [id number]) (1), unless the authorization is terminated or revoked sooner. When diagnostic testing is negative, the possibility of a false negative result should be considered in the context of a patient's recent exposures and the presence of clinical signs and symptoms consistent with COVID-19. An individual without sympto ms of COVID-19 and who is not shedding SARS-CoV-2 virus would expect to have a negative (not detected) result in this assay.   Respiratory Panel by RT PCR (Flu A&B, Covid) - Nasopharyngeal Swab     Status: None   Collection Time: 06/06/19  4:05 PM   Specimen: Nasopharyngeal Swab  Result Value Ref Range Status   SARS Coronavirus 2 by RT PCR NEGATIVE NEGATIVE Final    Comment: (NOTE) SARS-CoV-2 target nucleic acids are NOT DETECTED. The SARS-CoV-2 RNA is generally detectable in upper respiratoy specimens during the acute phase of infection. The lowest concentration of SARS-CoV-2 viral copies this assay can detect is 131 copies/mL. A negative result does not preclude SARS-Cov-2 infection and should not be  used as the sole basis for treatment or other patient management decisions. A negative result may occur with  improper specimen collection/handling, submission of specimen other than nasopharyngeal swab, presence of viral mutation(s) within the areas targeted by this assay, and inadequate number of viral copies (<131 copies/mL). A negative result must be combined with clinical observations, patient history, and epidemiological information. The expected result is Negative. Fact Sheet for Patients:  PinkCheek.be Fact Sheet for Healthcare Providers:  GravelBags.it This test is not yet ap proved or cleared by the [place] FDA and  has been authorized for detection and/or diagnosis of SARS-CoV-2 by FDA under an Emergency Use Authorization (EUA). This EUA will remain  in effect (meaning this test can be used) for the duration of the COVID-19 declaration under Section 564(b)(1) of the Act, 21 U.S.C. section 360bbb-3(b)(1), unless the authorization is terminated or revoked sooner.    Influenza A by PCR NEGATIVE NEGATIVE Final   Influenza B by PCR NEGATIVE NEGATIVE Final    Comment: (NOTE) The Xpert Xpress SARS-CoV-2/FLU/RSV assay is intended as an aid in  the diagnosis of influenza from Nasopharyngeal swab specimens and  should not be used as a sole basis for treatment. Nasal washings and  aspirates are unacceptable for Xpert Xpress SARS-CoV-2/FLU/RSV  testing. Fact Sheet for Patients: PinkCheek.be Fact Sheet for Healthcare Providers: GravelBags.it This test is not yet approved or cleared by the [place] FDA and  has been authorized for detection and/or diagnosis of SARS-CoV-2 by  FDA under an Emergency Use Authorization (EUA). This EUA will remain  in effect (meaning this test can be used) for the duration of the  Covid-19 declaration under Section 564(b)(1) of the  Act, 21  U.S.C. section 360bbb-3(b)(1), unless the authorization is  terminated or revoked. Performed at [facility], [place] [address] [place], [place] [address]   MRSA PCR Screening     Status: None   Collection Time: 06/06/19  8:08 PM   Specimen: Nasal Mucosa; Nasopharyngeal  Result Value Ref Range Status   MRSA by PCR NEGATIVE NEGATIVE Final    Comment:        The GeneXpert MRSA Assay (FDA approved for NASAL specimens only), is  one component of a comprehensive MRSA colonization surveillance program. It is not intended to diagnose MRSA infection nor to guide or monitor treatment for MRSA infections. Performed at [facility], [place] [address], [place], [place] [address]      Radiology Studies: No results found.  Scheduled Meds: . atorvastatin  80 mg Oral q1800  . carvedilol  12.5 mg Oral BID WC  . chlorhexidine  15 mL Mouth Rinse BID  . Chlorhexidine Gluconate Cloth  6 each Topical Daily  . ezetimibe  10 mg Oral Daily  . furosemide  40 mg Oral Daily  . isosorbide mononitrate  30 mg Oral Daily  . mouth rinse  15 mL Mouth Rinse q12n4p  . pantoprazole  40 mg Oral Daily  . polyethylene glycol  17 g Oral Daily  . vitamin B-12  100 mcg Oral Daily   Continuous Infusions: . lactated ringers 75 mL/hr at 06/09/19 1532     LOS: 3 days   Marylu Lund, MD [facility] Pager On Amion  If 7PM-7AM, please contact night-coverage 06/09/2019, 3:40 PM

## 2019-06-10 DIAGNOSIS — S065X9A Traumatic subdural hemorrhage with loss of consciousness of unspecified duration, initial encounter: Secondary | ICD-10-CM

## 2019-06-10 LAB — COMPREHENSIVE METABOLIC PANEL
ALT: 25 U/L (ref 0–44)
AST: 38 U/L (ref 15–41)
Albumin: 2.9 g/dL — ABNORMAL LOW (ref 3.5–5.0)
Alkaline Phosphatase: 36 U/L — ABNORMAL LOW (ref 38–126)
Anion gap: 12 (ref 5–15)
BUN: 25 mg/dL — ABNORMAL HIGH (ref 8–23)
CO2: 21 mmol/L — ABNORMAL LOW (ref 22–32)
Calcium: 8.7 mg/dL — ABNORMAL LOW (ref 8.9–10.3)
Chloride: 106 mmol/L (ref 98–111)
Creatinine, Ser: 1.07 mg/dL (ref 0.61–1.24)
GFR calc Af Amer: >60 mL/min (ref >60)
GFR calc non Af Amer: >60 mL/min (ref >60)
Glucose, Bld: 116 mg/dL — ABNORMAL HIGH (ref 70–99)
Potassium: 4 mmol/L (ref 3.5–5.1)
Sodium: 139 mmol/L (ref 135–145)
Total Bilirubin: 1.9 mg/dL — ABNORMAL HIGH (ref 0.3–1.2)
Total Protein: 5.3 g/dL — ABNORMAL LOW (ref 6.5–8.1)

## 2019-06-10 NOTE — Progress Notes (Signed)
PROGRESS NOTE    Mark Clayton  [id number] DOB: March 21, 1941 DOA: 06/06/2019 PCP: [name], MD    Brief Narrative:  [phone] with hx PAF on xarelto, MI, LV thrombus, ICM, HTN, HLD, CAD, sigmoid colon CA s/p surgery presented after falling backwards and hitting his head on the concrete, found to have acute extra-axial hemorrhage on L on CT head. Patient was initially admitted to ICU and received KCentra to reverse xarelto. Neurosurgery followed with recommendation for no surgical intervention. During workup, patient was found to have acute L MCA infarct. Stroke team consulted. Patient since transferred to hospitalist service 3/10.   Assessment & Plan:   Active Problems:   ICH (intracerebral hemorrhage) ([place])  1. ICH 1. Post-traumatic after fall and hitting concrete 2. Had been followed by Neurosurgery with no recommendation for surgical intervention, since signing off as of 3/11 3. Anticoagulation remains on hold at this time 2. Acute L MCA CVA 1. Stroke team had been following 2. Unable to perform MRI secondary to AICD not compatible with MRI 3. Cont to hold anticoagulation per above 4. LDL 60 with A1c of 6.1 5. Per Neurology, consider resuming anticoagulation in 2-3 weeks if SDH is resolving and pt clinically stable 6. Therapy recommending CIR, seen by CIR staff with insurance authorization currently in process 3. CAD 1. Without chest pain or sob 2. S/p prior CABG and ICD placement 3. Cath on 8/25 with findings of severe native CAD noted s/p PCI 4. Currently remains off anticoagulation and antiplatelet per above  5. Per Neurology, consider resuming anticoagulation if stable in 2-3 weeks 4. PAF 1. Currently rate controlled 2. Continued on coreg 3. Plans to resume anticoagulation per above 5. HTN 1. BP had remained stable at present 2. Cont current regimen and titrate as tolerated 6. HLD 1. LD of 60 2. Continued on statin as tolerated 7. Hx colon cancer 1. S/p  sigmoidectomy as well as later R hemicolectomy in 8/14 2. Followed by GI Velora Heckler) as well as General Surgery 3. Chart reviewed, noted to be extremely high risk for future colon polyps 4. Currently stable 8. Hx LV thrombus 1. Seems stable currently 2. Per above, plan anticoagulation when stable  9. Dehydration 1. Urine appeared concentrated earlier with concern of mild dehydration 2. Improved with IVF hydration  DVT prophylaxis: SCD's Code Status: Full Family Communication: Pt in room, family at bedside Disposition Plan: From home, plan CIR when insurance authorization clears  Consultants:   PCCM  Neurosurgery  Neurology  CIR  Procedures:     Antimicrobials: Anti-infectives (From admission, onward)   None      Subjective: Reports feeling somewhat better. States he is at times able to move fingers in R hand  Objective: Vitals:   06/10/19 0600 06/10/19 0809 06/10/19 1225 06/10/19 1550  BP: 129/79 (!) 141/77 (!) 136/96 121/82  Pulse: 94 86 78 85  Resp: 20 19 17 18   Temp:  98 F (36.7 C) 98.4 F (36.9 C) 98.2 F (36.8 C)  TempSrc:  Oral Oral Oral  SpO2: 98% 94% 98% 98%  Weight:        Intake/Output Summary (Last 24 hours) at 06/10/2019 1554 Last data filed at 06/10/2019 0452 Gross per 24 hour  Intake --  Output 850 ml  Net -850 ml   Filed Weights   06/08/19 0436 06/09/19 0342 06/10/19 0456  Weight: 77.9 kg 74.9 kg 78.5 kg    Examination: General exam: Awake, laying in bed, in nad Respiratory system: Normal  respiratory effort, no wheezing Cardiovascular system: regular rate, s1, s2 Gastrointestinal system: Soft, nondistended, positive BS Central nervous system: CN2-12 grossly intact, R sided weakness Extremities: Perfused, no clubbing Skin: Normal skin turgor, no notable skin lesions seen Psychiatry: Mood normal // no visual hallucinations   Data Reviewed: I have personally reviewed following labs and imaging studies  CBC: Recent Labs  Lab  06/06/19 1549 06/06/19 1551 06/07/19 0540 06/08/19 0509  WBC 5.9  --  9.8 8.3  NEUTROABS 3.5  --   --   --   HGB 12.2* 12.2* 11.6* 12.0*  HCT 37.6* 36.0* 35.4* 36.6*  MCV 88.3  --  86.8 88.4  PLT 176  --  177 [id number]   Basic Metabolic Panel: Recent Labs  Lab 06/06/19 1549 06/06/19 1549 06/06/19 1551 06/07/19 0540 06/08/19 0509 06/09/19 0448 06/10/19 0426  NA 141   < > 142 144 141 142 139  K 3.6   < > 3.5 3.1* 3.4* 3.6 4.0  CL 107   < > 106 111 108 104 106  CO2 25  --   --  23 23 24  21*  GLUCOSE 133*   < > 124* 125* 134* 114* 116*  BUN 13   < > 12 10 14 22  25*  CREATININE 1.27*   < > 1.20 1.01 1.20 1.12 1.07  CALCIUM 9.1  --   --  8.9 8.9 9.1 8.7*  MG  --   --   --  1.9 2.2  --   --   PHOS  --   --   --  1.8* 3.0  --   --    < > = values in this interval not displayed.   GFR: Estimated Creatinine Clearance: 62.5 mL/min (by C-G formula based on SCr of 1.07 mg/dL). Liver Function Tests: Recent Labs  Lab 06/06/19 1549 06/09/19 0448 06/10/19 0426  AST 17 31 38  ALT 11 16 25   ALKPHOS 49 38 36*  BILITOT 1.2 1.9* 1.9*  PROT 6.2* 5.7* 5.3*  ALBUMIN 3.6 3.0* 2.9*   No results for input(s): LIPASE, AMYLASE in the last 168 hours. No results for input(s): AMMONIA in the last 168 hours. Coagulation Profile: Recent Labs  Lab 06/06/19 1549  INR 1.1   Cardiac Enzymes: No results for input(s): CKTOTAL, CKMB, CKMBINDEX, TROPONINI in the last 168 hours. BNP (last 3 results) Recent Labs    08/26/18 0856 03/13/19 1028  PROBNP 382.0* 410.0*   HbA1C: No results for input(s): HGBA1C in the last 72 hours. CBG: Recent Labs  Lab 06/06/19 1542  GLUCAP 119*   Lipid Profile: No results for input(s): CHOL, HDL, LDLCALC, TRIG, CHOLHDL, LDLDIRECT in the last 72 hours. Thyroid Function Tests: No results for input(s): TSH, T4TOTAL, FREET4, T3FREE, THYROIDAB in the last 72 hours. Anemia Panel: No results for input(s): VITAMINB12, FOLATE, FERRITIN, TIBC, IRON, RETICCTPCT in the  last 72 hours. Sepsis Labs: No results for input(s): PROCALCITON, LATICACIDVEN in the last 168 hours.  Recent Results (from the past 240 hour(s))  Novel Coronavirus, NAA ([facility])     Status: None   Collection Time: 06/02/19  7:17 PM   Specimen: Nasopharyngeal(NP) swabs in vial transport medium   NASOPHARYNGE  IS THIS  Result Value Ref Range Status   SARS-CoV-2, NAA Not Detected Not Detected Final    Comment: Testing was performed using the cobas(R) SARS-CoV-2 test. This nucleic acid amplification test was developed and its performance characteristics determined by [employer], [employer] and [employer]. Nucleic acid amplification tests include RT-PCR and  TMA. This test has not been FDA cleared or approved. This test has been authorized by FDA under an Emergency Use Authorization (EUA). This test is only authorized for the duration of time the declaration that circumstances exist justifying the authorization of the emergency use of in vitro diagnostic tests for detection of SARS-CoV-2 virus and/or diagnosis of COVID-19 infection under section 564(b)(1) of the Act, 21 U.S.C. [id number]) (1), unless the authorization is terminated or revoked sooner. When diagnostic testing is negative, the possibility of a false negative result should be considered in the context of a patient's recent exposures and the presence of clinical signs and symptoms consistent with COVID-19. An individual without sympto ms of COVID-19 and who is not shedding SARS-CoV-2 virus would expect to have a negative (not detected) result in this assay.   Respiratory Panel by RT PCR (Flu A&B, Covid) - Nasopharyngeal Swab     Status: None   Collection Time: 06/06/19  4:05 PM   Specimen: Nasopharyngeal Swab  Result Value Ref Range Status   SARS Coronavirus 2 by RT PCR NEGATIVE NEGATIVE Final    Comment: (NOTE) SARS-CoV-2 target nucleic acids are NOT DETECTED. The SARS-CoV-2 RNA is generally detectable in upper respiratoy specimens  during the acute phase of infection. The lowest concentration of SARS-CoV-2 viral copies this assay can detect is 131 copies/mL. A negative result does not preclude SARS-Cov-2 infection and should not be used as the sole basis for treatment or other patient management decisions. A negative result may occur with  improper specimen collection/handling, submission of specimen other than nasopharyngeal swab, presence of viral mutation(s) within the areas targeted by this assay, and inadequate number of viral copies (<131 copies/mL). A negative result must be combined with clinical observations, patient history, and epidemiological information. The expected result is Negative. Fact Sheet for Patients:  PinkCheek.be Fact Sheet for Healthcare Providers:  GravelBags.it This test is not yet ap proved or cleared by the [place] FDA and  has been authorized for detection and/or diagnosis of SARS-CoV-2 by FDA under an Emergency Use Authorization (EUA). This EUA will remain  in effect (meaning this test can be used) for the duration of the COVID-19 declaration under Section 564(b)(1) of the Act, 21 U.S.C. section 360bbb-3(b)(1), unless the authorization is terminated or revoked sooner.    Influenza A by PCR NEGATIVE NEGATIVE Final   Influenza B by PCR NEGATIVE NEGATIVE Final    Comment: (NOTE) The Xpert Xpress SARS-CoV-2/FLU/RSV assay is intended as an aid in  the diagnosis of influenza from Nasopharyngeal swab specimens and  should not be used as a sole basis for treatment. Nasal washings and  aspirates are unacceptable for Xpert Xpress SARS-CoV-2/FLU/RSV  testing. Fact Sheet for Patients: PinkCheek.be Fact Sheet for Healthcare Providers: GravelBags.it This test is not yet approved or cleared by the [place] FDA and  has been authorized for detection and/or diagnosis of  SARS-CoV-2 by  FDA under an Emergency Use Authorization (EUA). This EUA will remain  in effect (meaning this test can be used) for the duration of the  Covid-19 declaration under Section 564(b)(1) of the Act, 21  U.S.C. section 360bbb-3(b)(1), unless the authorization is  terminated or revoked. Performed at [facility], [place] [address] [place], [place] [address]   MRSA PCR Screening     Status: None   Collection Time: 06/06/19  8:08 PM   Specimen: Nasal Mucosa; Nasopharyngeal  Result Value Ref Range Status   MRSA by PCR NEGATIVE NEGATIVE Final  Comment:        The GeneXpert MRSA Assay (FDA approved for NASAL specimens only), is one component of a comprehensive MRSA colonization surveillance program. It is not intended to diagnose MRSA infection nor to guide or monitor treatment for MRSA infections. Performed at [facility], [place] [address] [place], [place] [address]      Radiology Studies: No results found.  Scheduled Meds: . atorvastatin  80 mg Oral q1800  . carvedilol  12.5 mg Oral BID WC  . chlorhexidine  15 mL Mouth Rinse BID  . Chlorhexidine Gluconate Cloth  6 each Topical Daily  . ezetimibe  10 mg Oral Daily  . furosemide  40 mg Oral Daily  . isosorbide mononitrate  30 mg Oral Daily  . mouth rinse  15 mL Mouth Rinse q12n4p  . pantoprazole  40 mg Oral Daily  . polyethylene glycol  17 g Oral Daily  . vitamin B-12  100 mcg Oral Daily   Continuous Infusions: . lactated ringers 75 mL/hr at 06/10/19 0455     LOS: 4 days   Marylu Lund, MD [facility] Pager On Amion  If 7PM-7AM, please contact night-coverage 06/10/2019, 3:54 PM

## 2019-06-10 NOTE — PMR Pre-admission (Signed)
PMR Admission Coordinator Pre-Admission Assessment  Patient: Mark Clayton is an 79 y.o., male MRN: [id number] DOB: [birth year] Height:   Weight: 76.8 kg              Insurance Information HMO: yes    PPO:      PCP:      IPA:      80/20:      OTHER:  PRIMARY: [employer] Medicare      Policy#: [id number]      Subscriber: patient CM Name: Quillian Quince      Phone#: [phone] opt 5     Fax#: [phone] Pre-Cert#: [id number]      Employer:  [employer]auf[employer] provided by Quillian Quince on 3/15 for admit to CIR with start date 3/15. Pt is approved for 8 days with next review date 3/22. Clinical are to be faxed to (f): [phone] (p): [phone] opt 3 Benefits:  Phone #: online      Name: [email or web address] Eff. Date: 03/31/19-03/29/20     Deduct: does not have ($0)      Out of Pocket Max: $3,600 ($832.60)      Life Max: NA CIR: $295/day co-pay for days 1-5, $0/day co-pay for days 6+      SNF: $0/day co-pay for days 1-20, $184/day co-pay for days 21-40, $0/day co-pay for days 41-100; limited to 100 days/cal yr Outpatient: $30/visit co-pay; limited by medical necessity     Home Health: 100% coverage      Co-Pay: 0% co-insurance; limited by medical necessity DME: 80% coverage     Co-Pay: 20% co-insurance Providers:  SECONDARY: none       Policy#:       Subscriber:  CM Name:       Phone#:      Fax#:  Pre-Cert#:       Employer:  Benefits:  Phone #:      Name: Eff. Date:      Deduct:       Out of Pocket Max:      Life Max:  CIR:       SNF:  Outpatient:      Co-Pay: Home Health:       Co-Pay:  DME:      Co-Pay:   Medicaid Application Date:       Case Manager:  Disability Application Date:       Case Worker:   The "Data Collection Information Summary" for patients in Inpatient Rehabilitation Facilities with attached "Privacy Act [place] Records" was provided and verbally reviewed with: Patient and Family  Emergency Contact Information Contact Information    Name Relation Home Work [place]  Daughter [phone]  [phone]   Lennice Sites Daughter   [phone]   Laverda Page Daughter   [phone]   Genia Harold   [phone]     Current Medical History  Patient Admitting Diagnosis: Left parafalcine subdural hematoma and subarachnoid hemorrhage  History of Present Illness:  [name] is a 79 y.o. male with history of CAD, colon cancer, Macular degeneration--legally blind, chronic systolic CHF s/p AICD, PAF- on Xarelto who was admitted on 06/06/19 for work up after falling backwards in the yard, sticking his head and developing left sided weakness. CT head showed left hemispheric SDH and Xarelto reversed with Kcentra.  CTA head/neck/perfusion showed branch occlusion left M3 with corresponding acute infarct in left parietal lobe with surrounding penumbra. Neurology fel that L-MCA infarct embolic due to a fib and no antithrombotic due to hemorrhage. Dr. Annette Stable  recommended conservative management with serial CT head.   Elevated BP treated with cleviprex.  2D echo showed global hypokinesis with EF < 20% and moderately elevated pulmonary pressures. Follow up CT head showed stable bleed with interval development of left frontoparietal infarct. Mentation improving and therapy evaluations completed today. Patient with resultant right hemiparesis with right inattention, expressive > receptive aphasia with dysarthria and visual deficits affecting mobility and ADLs. On 3/15, pt was noted to have tachypnea on exam with possible CHF exacerbation. Pt was transitioned to IV lasix and daily weight monitored. CIR recommended due to functional decline. Pt is to be admitted to CIR on 06/14/19  Complete NIHSS TOTAL: 14 Glasgow Coma Scale Score: 15  Past Medical History  Past Medical History:  Diagnosis Date  . AICD (automatic cardioverter/defibrillator) present 01/17/2003   Medtronic Maximo 7232CX ICD, serial [id number]  . Anemia 02-06-11   takes oral iron  . Arthritis    hands, knees  .  CAD (coronary artery disease) 2003   a. h/o MI and CABG in 2003. b. s/p DES to SVG-RPDA-RPLB in 08/2014.  Marland Kitchen Cancer of sigmoid colon ([place]) 2012   a. s/p colon surgery.  . Carotid bruit   . Chronic systolic CHF (congestive heart failure) (HCC)    a. EF 20% in 2014; b. 08/2017 Echo: EF 20-25%, diff HK, Gr3 DD. Triv AI. Mod MR. Sev dil LA. Mildly dil RV w/ mildly reduced RV fxn. Mildly dil RA. Mod TR. PASP 69mHg.  Marland Kitchen Cough   . GERD (gastroesophageal reflux disease) 02-06-11  . HTN (hypertension)   . Hyperlipidemia   . Ischemic cardiomyopathy    a. EF 20% in 2014. (Master study EF >20%); b. 08/2017 Echo: EF 20-25%, diff HK. Gr3 DD.  . LV (left ventricular) mural thrombus    a. 12/2012 Echo: EF 20% with mural thrombus No evidence of thrombus on 08/2017 echo.  . Macular degeneration   . Myocardial infarct ([place])    2003  . PAF (paroxysmal atrial fibrillation) (HCC)    a. CHA2DS2VASc = 5-->Xarelto/Tikosyn.    Family History  family history includes Alzheimer's disease in his mother; Heart disease in his father; Hyperlipidemia in his father and sister; Hypertension in his father and sister; Prostate cancer in his father.  Prior Rehab/Hospitalizations:  Has the patient had prior rehab or hospitalizations prior to admission? Yes  Has the patient had major surgery during 100 days prior to admission? No  Current Medications   Current Facility-Administered Medications:  .  atorvastatin (LIPITOR) tablet 80 mg, 80 mg, Oral, q1800, Rosalin Hawking, MD, 80 mg at 06/13/19 1724 .  benzonatate (TESSALON) capsule 200 mg, 200 mg, Oral, BID PRN, Blount, Xenia T, NP, 200 mg at 06/12/19 2126 .  carvedilol (COREG) tablet 12.5 mg, 12.5 mg, Oral, BID WC, Rosalin Hawking, MD, 12.5 mg at 06/14/19 0920 .  chlorhexidine (PERIDEX) 0.12 % solution 15 mL, 15 mL, Mouth Rinse, BID, Rosalin Hawking, MD, 15 mL at 06/14/19 0919 .  Chlorhexidine Gluconate Cloth 2 % PADS 6 each, 6 each, Topical, Daily, Rosalin Hawking, MD, 6 each at 06/14/19  1100 .  ezetimibe (ZETIA) tablet 10 mg, 10 mg, Oral, Daily, Rosalin Hawking, MD, 10 mg at 06/13/19 1724 .  furosemide (LASIX) injection 40 mg, 40 mg, Intravenous, BID, Donne Hazel, MD, 40 mg at 06/14/19 0920 .  hydrALAZINE (APRESOLINE) injection 10-40 mg, 10-40 mg, Intravenous, Q4H PRN, Desai, Rahul P, PA-C, 20 mg at 06/13/19 0420 .  isosorbide mononitrate (IMDUR) 24 hr tablet  30 mg, 30 mg, Oral, Daily, Rosalin Hawking, MD, 30 mg at 06/14/19 0919 .  MEDLINE mouth rinse, 15 mL, Mouth Rinse, q12n4p, Rosalin Hawking, MD, 15 mL at 06/14/19 1117 .  oxyCODONE-acetaminophen (PERCOCET/ROXICET) 5-325 MG per tablet 1 tablet, 1 tablet, Oral, Q6H PRN, Mannam, Praveen, MD, 1 tablet at 06/11/19 2102 .  pantoprazole (PROTONIX) EC tablet 40 mg, 40 mg, Oral, Daily, Rosalin Hawking, MD, 40 mg at 06/14/19 0918 .  polyethylene glycol (MIRALAX / GLYCOLAX) packet 17 g, 17 g, Oral, Daily, Donne Hazel, MD, 17 g at 06/13/19 0835 .  vitamin B-12 (CYANOCOBALAMIN) tablet 100 mcg, 100 mcg, Oral, Daily, Rosalin Hawking, MD, 100 mcg at 06/14/19 0919  Patients Current Diet:  Diet Order            Diet - low sodium heart healthy        DIET DYS 2 Room service appropriate? Yes with Assist; Fluid consistency: Thin  Diet effective now              Precautions / Restrictions Precautions Precautions: Fall, Other (comment) Precaution Comments: R hemiparesis, check orthostatics Restrictions Weight Bearing Restrictions: No   Has the patient had 2 or more falls or a fall with injury in the past year?Yes  Prior Activity Level Community (5-7x/wk): active and independent prior to admission. 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 does not drive due to low vision.   Prior Functional Level Prior Function Level of Independence: Independent Comments: 80% blind, does not drive. Manages own medications with intermittent assist from granddaughter for set up, cooks, cleans. Enjoys wood working, Control and instrumentation engineer.    Self Care: Did the patient need help  bathing, dressing, using the toilet or eating?  Independent  Indoor Mobility: Did the patient need assistance with walking from room to room (with or without device)? Independent  Stairs: Did the patient need assistance with internal or external stairs (with or without device)? Independent  Functional Cognition: Did the patient need help planning regular tasks such as shopping or remembering to take medications? Independent  Home Assistive Devices / Equipment    Prior Device Use: Indicate devices/aids used by the patient prior to current illness, exacerbation or injury? None of the above  Current Functional Level Cognition  Overall Cognitive Status: Impaired/Different from baseline Difficult to assess due to: Impaired communication Current Attention Level: Focused, Sustained Orientation Level: Oriented X4 Following Commands: Follows one step commands inconsistently Safety/Judgement: Decreased awareness of safety, Decreased awareness of deficits General Comments: pt asking to call family and reports to family that he is in a room with no clothes and 65 degrees. therapist adjusting heat in room closing door. pt state "no i dont want it hot. yall arent listening"    Extremity Assessment (includes Sensation/Coordination)  Upper Extremity Assessment: RUE deficits/detail RUE Deficits / Details: noted to have thumb and 2nd digit activation. when hand placed on bar attempting to make a fist around bar. pt unable to complete with command but initiated with functional task RUE Sensation: decreased light touch RUE Coordination: decreased fine motor, decreased gross motor  Lower Extremity Assessment: RLE deficits/detail RLE Deficits / Details: No active movement noted    ADLs  Overall ADL's : Needs assistance/impaired Eating/Feeding: Minimal assistance Grooming: Moderate assistance, Sitting(stedy) Grooming Details (indicate cue type and reason): pt static sitting in stedy at sink with strong  R lean. Pt requires everything setup to allow L Ue use for oral care Upper Body Bathing: Moderate assistance, Sitting Lower Body Bathing: Total assistance, Maximal assistance,  Bed level Upper Body Dressing : Minimal assistance Lower Body Dressing: Total assistance Lower Body Dressing Details (indicate cue type and reason): pt reports "i didnt do that before all this" General ADL Comments: pt progressed from supine to static standing in stedy. pt with R lean due to R side weakness but with cues able to initiate alignment    Mobility  Overal bed mobility: Needs Assistance Bed Mobility: Rolling, Sidelying to Sit Rolling: Mod assist, +2 for physical assistance Sidelying to sit: Mod assist, +2 for physical assistance Supine to sit: +2 for physical assistance, Mod assist Sit to supine: +2 for physical assistance, Max assist General bed mobility comments: Pt required cues for hand and foot placement to roll to R side of bed.  He was able to reach for railing with L hand and required assistance to advance [name] to edge of bed.  Once in this position he required min +2 to elevate trunk into sitting and mod +2 to maintain sitting.  Pt bracing with L hand on foot board with posterior lean.  Able to correct with cueing and facilitation.    Transfers  Overall transfer level: Needs assistance Equipment used: Ambulation equipment used(stedy) Transfer via Lift Equipment: Stedy Transfers: Sit to/from Stand Sit to Stand: Mod assist, +2 physical assistance General transfer comment: Pt able to power up with less deviation to the R.  Listing remains to the right but not as prominent as it has been in previous session.  Focused on sit to stand reps x 5 from stedy plates and able to move RLE toward extension.    Ambulation / Gait / Stairs / Wheelchair Mobility  Ambulation/Gait Ambulation/Gait assistance: (NT)    Posture / Balance Dynamic Sitting Balance Sitting balance - Comments: zero to poor when fatigued  unable to maintain sitting without adequate support. Balance Overall balance assessment: Needs assistance Sitting-balance support: Bilateral upper extremity supported, Feet supported Sitting balance-Leahy Scale: Poor Sitting balance - Comments: zero to poor when fatigued unable to maintain sitting without adequate support. Postural control: Right lateral lean Standing balance-Leahy Scale: Poor Standing balance comment: R lateral lean.  Required facilitation to correct position.    Special needs/care consideration BiPAP/CPAP: no CPM: no Continuous Drip IV: no Dialysis: no        Days: no Life Vest: no Oxygen:  Special Bed: no Trach Size: no Wound Vac (area): no      Location: no Skin: abrasion to posterior head, ecchymosis to right/left arm.                           Bowel mgmt: last BM 06/14/19 Bladder mgmt: incontinent, external catheter in place Diabetic mgmt: no Behavioral consideration : no Chemo/radiation : no     Previous Home Environment (from acute therapy documentation) Living Arrangements: Other relatives  Lives With: Spouse Available Help at Discharge: Family Type of Home: House Home Layout: Able to live on main level with bedroom/bathroom Home Access: Other (comment)(threshold) Bathroom Shower/Tub: Tub/shower unit Additional Comments: Granddaughter goes to college T-Sa 10-2; states there are other family members who are offering to provide 24/7 i.e. sister lives down street, ex wife, daughters  Discharge Living Setting Plans for Discharge Living Setting: House, Lives with (comment) Type of Home at Discharge: House(granddaughter [name]) Discharge Home Layout: Two level, Able to live on main level with bedroom/bathroom Alternate Level Stairs-Rails: None Alternate Level Stairs-Number of Steps: NA -2nd story only for storage Discharge Home Access: Stairs to enter  Entrance Stairs-Rails: None Entrance Stairs-Number of Steps: two Discharge Bathroom Shower/Tub:  Tub/shower unit, Walk-in shower Discharge Bathroom Toilet: Standard Discharge Bathroom Accessibility: Yes How Accessible: Accessible via walker Does the patient have any problems obtaining your medications?: No  Social/Family/Support Systems Patient Roles: Other (Comment)(retired Art gallery manager, 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.does woodworking) Sport and exercise psychologist Information: granddaughter [name]: cell: [phone] Anticipated Caregiver: [name]  Anticipated Caregiver's Contact Information: see above Ability/Limitations of Caregiver: Min A Caregiver Availability: 24/7(either [name] will take FMLA or will have hired caregiver) Discharge Plan Discussed with Primary Caregiver: Yes Is Caregiver In Agreement with Plan?: Yes Does Caregiver/Family have Issues with Lodging/Transportation while Pt is in Rehab?: No   Goals/Additional Needs Patient/Family Goal for Rehab: PT/OT: Min A; SLP: Supervision Expected length of stay: 18-22 days Cultural Considerations: NA Dietary Needs: DYS 2, thin liquids; room service with assist.  Equipment Needs: TBD Pt/Family Agrees to Admission and willing to participate: Yes Program Orientation Provided & Reviewed with Pt/Caregiver Including Roles  & Responsibilities: Yes(pt and his granddaughter)  Barriers to Discharge: Home environment access/layout, Lack of/limited family support  Barriers to Discharge Comments: steps to enter home; will need to be 1 person assist.    Decrease burden of Care through IP rehab admission: NA   Possible need for SNF placement upon discharge:Not anticipated has has pt good social support from his granddaughter who has a flexibility school schedule, has offered to take FMLA/vs leave of absence from her program, or have family/friend present to assist at DC. Pt has an excellent prognosis for further progress through CIR.    Patient Condition: This patient's medical and functional status has changed since the consult dated: 06/08/19 in which the  Rehabilitation Physician determined and documented that the patient's condition is appropriate for intensive rehabilitative care in an inpatient rehabilitation facility. See "History of Present Illness" (above) for medical update. Functional changes are: improvement in bed mobility from Max A +2 to Mod A +2, improvement in transfers from Mod/Max A + 2 to Mod A +2. Patient's medical and functional status update has been discussed with the Rehabilitation physician and patient remains appropriate for inpatient rehabilitation. Will admit to inpatient rehab today.  Preadmission Screen Completed By:  Raechel Ache, OT, 06/14/2019 3:27 PM ______________________________________________________________________   Discussed status with Dr. Ranell Patrick on 06/14/19 at 3:27PM and received approval for admission today.  Admission Coordinator:  Raechel Ache, time 3:27PM/Date 06/14/19

## 2019-06-10 NOTE — Progress Notes (Addendum)
Physical Therapy Treatment Patient Details Name: Mark Clayton MRN: [id number] DOB: 1941-02-21 Today's Date: 06/10/2019    History of Present Illness Mark Clayton is a 79 y.o. male who was admitted 3/9 with traumatic ICH after falling backwards onto concrete. PMH including PAF on xarelto, MI, LV thrombus, ICM, HTN, HLD, CAD, CA of sigmoid colon s/p surgery. CT (3/9) revealing a combination of left parafalcine subdural hematoma and subarachnoid hemorrhage. No acute ischemic infarct on CT.     PT Comments    Pt supine in bed on arrival.  PTA obtained supine and seated BP with no change and no complaints of dizziness.  Once in standing he does report dizziness but this resolved after sitting in recliner.  Informed nursing of patient position and daughter at his bedside.  He continues to benefit from skilled rehab in a post acute setting to improve strength and function before returning home.     Follow Up Recommendations  CIR;Supervision/Assistance - 24 hour     Equipment Recommendations  Wheelchair (measurements PT);Wheelchair cushion (measurements PT)    Recommendations for Other Services       Precautions / Restrictions Precautions Precautions: Fall;Other (comment) Precaution Comments: R hemiparesis, check ortho Restrictions Weight Bearing Restrictions: No    Mobility  Bed Mobility Overal bed mobility: Needs Assistance Bed Mobility: Supine to Sit;Sit to Supine     Supine to sit: Max assist;+2 for physical assistance     General bed mobility comments: Max +2 for rolling to move [name] to edge of bed and elevate into a seated position.  He is very weak and continues to present with poor trunk control and strong lateral push to the R side.  Transfers Overall transfer level: Needs assistance Equipment used: Ambulation equipment used(sara stedy) Transfers: Sit to/from Stand Sit to Stand: +2 physical assistance;Max assist         General transfer comment: Pt continues with  zero activation of R quad he reports dizziness in standing and strong lateral push to the R.  Performed sit to stand x3.  Ambulation/Gait Ambulation/Gait assistance: (NT)               Stairs             Wheelchair Mobility    Modified Rankin (Stroke Patients Only)       Balance Overall balance assessment: Needs assistance Sitting-balance support: Feet supported Sitting balance-Leahy Scale: Zero Sitting balance - Comments: zero to poor when fatigued unable to maintain sitting without adequate support. Postural control: Right lateral lean   Standing balance-Leahy Scale: Poor                              Cognition Arousal/Alertness: Awake/alert Behavior During Therapy: WFL for tasks assessed/performed Overall Cognitive Status: Difficult to assess Area of Impairment: Following commands;Memory;Attention;Safety/judgement;Awareness                   Current Attention Level: Focused;Sustained Memory: Decreased short-term memory Following Commands: Follows one step commands inconsistently Safety/Judgement: Decreased awareness of safety;Decreased awareness of deficits Awareness: Intellectual   General Comments: Pt with expressive > receptive aphasia. Difficult to assess orientation; pt able to recall date, stating "yes," when asked if he was in a hospital. Recalled traumatic incident but not imaging findings i.e. that he had a SDH/SAH and its functional impact. Pt tending to give verbose, tangential answers when asked a question, most of which was unintelligible. Did not know how  to use call bell; able to recall with min cues after period of time.       Exercises      General Comments        Pertinent Vitals/Pain Pain Assessment: Faces Faces Pain Scale: No hurt    Home Living                      Prior Function            PT Goals (current goals can now be found in the care plan section) Acute Rehab PT Goals Patient Stated  Goal: none stated; pt granddaughter would like him to go to rehab Potential to Achieve Goals: Fair Progress towards PT goals: Progressing toward goals    Frequency    Min 4X/week      PT Plan Current plan remains appropriate    Co-evaluation              AM-PAC PT "6 Clicks" Mobility   Outcome Measure  Help needed turning from your back to your side while in a flat bed without using bedrails?: Total Help needed moving from lying on your back to sitting on the side of a flat bed without using bedrails?: Total Help needed moving to and from a bed to a chair (including a wheelchair)?: Total Help needed standing up from a chair using your arms (e.g., wheelchair or bedside chair)?: Total Help needed to walk in hospital room?: Total Help needed climbing 3-5 steps with a railing? : Total 6 Click Score: 6    End of Session Equipment Utilized During Treatment: Gait belt Activity Tolerance: Patient tolerated treatment well Patient left: in chair;with call bell/phone within reach;with chair alarm set Nurse Communication: Mobility status PT Visit Diagnosis: Other symptoms and signs involving the nervous system (R29.898);Hemiplegia and hemiparesis;Other abnormalities of gait and mobility (R26.89) Hemiplegia - Right/Left: Right Hemiplegia - dominant/non-dominant: Dominant     Time: [id number] PT Time Calculation (min) (ACUTE ONLY): 23 min  Charges:  $Therapeutic Activity: 23-37 mins                     Erasmo Leventhal , PTA Acute Rehabilitation Services Pager [phone] Office [phone]     Mark Clayton 06/10/2019, 2:31 PM

## 2019-06-11 ENCOUNTER — Other Ambulatory Visit: Payer: Self-pay | Admitting: Internal Medicine

## 2019-06-11 MED ORDER — BENZONATATE 100 MG PO CAPS
200.0000 mg | ORAL_CAPSULE | Freq: Two times a day (BID) | ORAL | Status: DC | PRN
  Administered 2019-06-11 – 2019-06-12 (×2): 200 mg via ORAL
  Filled 2019-06-11 (×2): qty 2

## 2019-06-11 NOTE — Progress Notes (Signed)
PROGRESS NOTE    [name]  [id number] DOB: 1941/02/08 DOA: 06/06/2019 PCP: [name], MD    Brief Narrative:  [phone] with hx PAF on xarelto, MI, LV thrombus, ICM, HTN, HLD, CAD, sigmoid colon CA s/p surgery presented after falling backwards and hitting his head on the concrete, found to have acute extra-axial hemorrhage on L on CT head. Patient was initially admitted to ICU and received KCentra to reverse xarelto. Neurosurgery followed with recommendation for no surgical intervention. During workup, patient was found to have acute L MCA infarct. Stroke team consulted. Patient since transferred to hospitalist service 3/10.   Assessment & Plan:   Active Problems:   ICH (intracerebral hemorrhage) ([place])  1. ICH 1. Post-traumatic after fall and hitting concrete 2. Had been followed by Neurosurgery with no recommendation for surgical intervention, since signing off as of 3/11 3. Anticoagulation currently on hold 2. Acute L MCA CVA 1. Stroke team had been following 2. Unable to perform MRI secondary to AICD not compatible with MRI 3. Cont to hold anticoagulation per above 4. LDL 60 with A1c of 6.1 5. Per Neurology, consider resuming anticoagulation in 2-3 weeks if SDH is resolving and pt clinically stable 6. Therapy recommending CIR, seen by CIR staff with insurance authorization in progress 3. CAD 1. Without chest pain or sob 2. S/p prior CABG and ICD placement 3. Cath on 8/25 with findings of severe native CAD noted s/p PCI 4. Currently remains off anticoagulation and antiplatelet per above  5. Per Neurology, recommendation for resuming anticoagulation if stable in 2-3 weeks 4. PAF 1. Currently rate controlled 2. Continued on coreg 3. Plans to resume anticoagulation per above 5. HTN 1. BP had remained stable at present 2. Cont on current regimen and titrate as tolerated 6. HLD 1. LD of 60 2. Continued on statin as tolerated 7. Hx colon cancer 1. S/p  sigmoidectomy as well as later R hemicolectomy in 8/14 2. Followed by GI Velora Heckler) as well as General Surgery 3. Chart reviewed, noted to be extremely high risk for future colon polyps 4. Presently stable 8. Hx LV thrombus 1. Seems stable currently 2. Per above, plan anticoagulation when stable  9. Dehydration 1. Urine appeared concentrated earlier with concern of mild dehydration 2. Noted to have improved following hydration 3. Given hx systolic CHF, IVF now d/c'd 10. Chronic systolic CHF 1. Currently compensated 2. Now off IVF. Cont to monitor 3. Continue on lasix as tolerated  DVT prophylaxis: SCD's Code Status: Full Family Communication: Pt in room, family at bedside Disposition Plan: From home, plan CIR when insurance authorization clears  Consultants:   PCCM  Neurosurgery  Neurology  CIR  Procedures:     Antimicrobials: Anti-infectives (From admission, onward)   None      Subjective: States feeling well this AM  Objective: Vitals:   06/10/19 2031 06/10/19 2340 06/11/19 0316 06/11/19 0416  BP: 134/84 136/70 137/80   Pulse: 84 78 95   Resp:  19 16   Temp:  98 F (36.7 C) 98.3 F (36.8 C)   TempSrc:  Oral Oral   SpO2:  98% 96%   Weight:    79 kg    Intake/Output Summary (Last 24 hours) at 06/11/2019 1548 Last data filed at 06/11/2019 1403 Gross per 24 hour  Intake 2578.24 ml  Output 1600 ml  Net 978.24 ml   Filed Weights   06/09/19 0342 06/10/19 0456 06/11/19 0416  Weight: 74.9 kg 78.5 kg 79 kg  Examination: General exam: Conversant, in no acute distress Respiratory system: normal chest rise, clear, no audible wheezing Cardiovascular system: regular rhythm, s1-s2 Gastrointestinal system: Nondistended, nontender, pos BS Central nervous system: No seizures, no tremors Extremities: No cyanosis, no joint deformities Skin: No rashes, no pallor Psychiatry: Affect normal // no auditory hallucinations   Data Reviewed: I have personally  reviewed following labs and imaging studies  CBC: Recent Labs  Lab 06/06/19 1549 06/06/19 1551 06/07/19 0540 06/08/19 0509  WBC 5.9  --  9.8 8.3  NEUTROABS 3.5  --   --   --   HGB 12.2* 12.2* 11.6* 12.0*  HCT 37.6* 36.0* 35.4* 36.6*  MCV 88.3  --  86.8 88.4  PLT 176  --  177 [id number]   Basic Metabolic Panel: Recent Labs  Lab 06/06/19 1549 06/06/19 1549 06/06/19 1551 06/07/19 0540 06/08/19 0509 06/09/19 0448 06/10/19 0426  NA 141   < > 142 144 141 142 139  K 3.6   < > 3.5 3.1* 3.4* 3.6 4.0  CL 107   < > 106 111 108 104 106  CO2 25  --   --  23 23 24  21*  GLUCOSE 133*   < > 124* 125* 134* 114* 116*  BUN 13   < > 12 10 14 22  25*  CREATININE 1.27*   < > 1.20 1.01 1.20 1.12 1.07  CALCIUM 9.1  --   --  8.9 8.9 9.1 8.7*  MG  --   --   --  1.9 2.2  --   --   PHOS  --   --   --  1.8* 3.0  --   --    < > = values in this interval not displayed.   GFR: Estimated Creatinine Clearance: 62.5 mL/min (by C-G formula based on SCr of 1.07 mg/dL). Liver Function Tests: Recent Labs  Lab 06/06/19 1549 06/09/19 0448 06/10/19 0426  AST 17 31 38  ALT 11 16 25   ALKPHOS 49 38 36*  BILITOT 1.2 1.9* 1.9*  PROT 6.2* 5.7* 5.3*  ALBUMIN 3.6 3.0* 2.9*   No results for input(s): LIPASE, AMYLASE in the last 168 hours. No results for input(s): AMMONIA in the last 168 hours. Coagulation Profile: Recent Labs  Lab 06/06/19 1549  INR 1.1   Cardiac Enzymes: No results for input(s): CKTOTAL, CKMB, CKMBINDEX, TROPONINI in the last 168 hours. BNP (last 3 results) Recent Labs    08/26/18 0856 03/13/19 1028  PROBNP 382.0* 410.0*   HbA1C: No results for input(s): HGBA1C in the last 72 hours. CBG: Recent Labs  Lab 06/06/19 1542  GLUCAP 119*   Lipid Profile: No results for input(s): CHOL, HDL, LDLCALC, TRIG, CHOLHDL, LDLDIRECT in the last 72 hours. Thyroid Function Tests: No results for input(s): TSH, T4TOTAL, FREET4, T3FREE, THYROIDAB in the last 72 hours. Anemia Panel: No results for  input(s): VITAMINB12, FOLATE, FERRITIN, TIBC, IRON, RETICCTPCT in the last 72 hours. Sepsis Labs: No results for input(s): PROCALCITON, LATICACIDVEN in the last 168 hours.  Recent Results (from the past 240 hour(s))  Novel Coronavirus, NAA ([facility])     Status: None   Collection Time: 06/02/19  7:17 PM   Specimen: Nasopharyngeal(NP) swabs in vial transport medium   NASOPHARYNGE  IS THIS  Result Value Ref Range Status   SARS-CoV-2, NAA Not Detected Not Detected Final    Comment: Testing was performed using the cobas(R) SARS-CoV-2 test. This nucleic acid amplification test was developed and its performance characteristics determined by [facility]. Nucleic acid amplification tests include RT-PCR and TMA. This test has not been FDA cleared or approved. This test has been authorized by FDA under an Emergency Use Authorization (EUA). This test is only authorized for the duration of time the declaration that circumstances exist justifying the authorization of the emergency use of in vitro diagnostic tests for detection of SARS-CoV-2 virus and/or diagnosis of COVID-19 infection under section 564(b)(1) of the Act, 21 U.S.C. [id number]) (1), unless the authorization is terminated or revoked sooner. When diagnostic testing is negative, the possibility of a false negative result should be considered in the context of a patient's recent exposures and the presence of clinical signs and symptoms consistent with COVID-19. An individual without sympto ms of COVID-19 and who is not shedding SARS-CoV-2 virus would expect to have a negative (not detected) result in this assay.   Respiratory Panel by RT PCR (Flu A&B, Covid) - Nasopharyngeal Swab     Status: None   Collection Time: 06/06/19  4:05 PM   Specimen: Nasopharyngeal Swab  Result Value Ref Range Status   SARS Coronavirus 2 by RT PCR NEGATIVE NEGATIVE Final    Comment: (NOTE) SARS-CoV-2 target nucleic acids are NOT DETECTED. The  SARS-CoV-2 RNA is generally detectable in upper respiratoy specimens during the acute phase of infection. The lowest concentration of SARS-CoV-2 viral copies this assay can detect is 131 copies/mL. A negative result does not preclude SARS-Cov-2 infection and should not be used as the sole basis for treatment or other patient management decisions. A negative result may occur with  improper specimen collection/handling, submission of specimen other than nasopharyngeal swab, presence of viral mutation(s) within the areas targeted by this assay, and inadequate number of viral copies (<131 copies/mL). A negative result must be combined with clinical observations, patient history, and epidemiological information. The expected result is Negative. Fact Sheet for Patients:  PinkCheek.be Fact Sheet for Healthcare Providers:  GravelBags.it This test is not yet ap proved or cleared by the [place] FDA and  has been authorized for detection and/or diagnosis of SARS-CoV-2 by FDA under an Emergency Use Authorization (EUA). This EUA will remain  in effect (meaning this test can be used) for the duration of the COVID-19 declaration under Section 564(b)(1) of the Act, 21 U.S.C. section 360bbb-3(b)(1), unless the authorization is terminated or revoked sooner.    Influenza A by PCR NEGATIVE NEGATIVE Final   Influenza B by PCR NEGATIVE NEGATIVE Final    Comment: (NOTE) The Xpert Xpress SARS-CoV-2/FLU/RSV assay is intended as an aid in  the diagnosis of influenza from Nasopharyngeal swab specimens and  should not be used as a sole basis for treatment. Nasal washings and  aspirates are unacceptable for Xpert Xpress SARS-CoV-2/FLU/RSV  testing. Fact Sheet for Patients: PinkCheek.be Fact Sheet for Healthcare Providers: GravelBags.it This test is not yet approved or cleared by the [place] FDA and  has been authorized for detection and/or diagnosis of SARS-CoV-2 by  FDA under an Emergency Use Authorization (EUA). This EUA will remain  in effect (meaning this test can be used) for the duration of the  Covid-19 declaration under Section 564(b)(1) of the Act, 21  U.S.C. section 360bbb-3(b)(1), unless the authorization is  terminated or revoked. Performed at [facility], [place] [address] [place], [place] [address]   MRSA PCR Screening     Status: None   Collection Time: 06/06/19  8:08 PM   Specimen: Nasal Mucosa; Nasopharyngeal  Result Value Ref Range Status  MRSA by PCR NEGATIVE NEGATIVE Final    Comment:        The GeneXpert MRSA Assay (FDA approved for NASAL specimens only), is one component of a comprehensive MRSA colonization surveillance program. It is not intended to diagnose MRSA infection nor to guide or monitor treatment for MRSA infections. Performed at [facility], [place] [address], [place], [place] [address]      Radiology Studies: No results found.  Scheduled Meds: . atorvastatin  80 mg Oral q1800  . carvedilol  12.5 mg Oral BID WC  . chlorhexidine  15 mL Mouth Rinse BID  . Chlorhexidine Gluconate Cloth  6 each Topical Daily  . ezetimibe  10 mg Oral Daily  . furosemide  40 mg Oral Daily  . isosorbide mononitrate  30 mg Oral Daily  . mouth rinse  15 mL Mouth Rinse q12n4p  . pantoprazole  40 mg Oral Daily  . polyethylene glycol  17 g Oral Daily  . vitamin B-12  100 mcg Oral Daily   Continuous Infusions:    LOS: 5 days   Marylu Lund, MD [facility] Pager On Amion  If 7PM-7AM, please contact night-coverage 06/11/2019, 3:48 PM

## 2019-06-12 ENCOUNTER — Inpatient Hospital Stay (HOSPITAL_COMMUNITY): Payer: Medicare Other

## 2019-06-12 DIAGNOSIS — R0682 Tachypnea, not elsewhere classified: Secondary | ICD-10-CM

## 2019-06-12 MED ORDER — FUROSEMIDE 10 MG/ML IJ SOLN
40.0000 mg | Freq: Every day | INTRAMUSCULAR | Status: DC
  Administered 2019-06-13: 40 mg via INTRAVENOUS
  Filled 2019-06-12: qty 4

## 2019-06-12 MED ORDER — FUROSEMIDE 10 MG/ML IJ SOLN
40.0000 mg | Freq: Once | INTRAMUSCULAR | Status: AC
  Administered 2019-06-12: 40 mg via INTRAVENOUS
  Filled 2019-06-12: qty 4

## 2019-06-12 NOTE — Progress Notes (Signed)
Occupational Therapy Treatment Patient Details Name: Mark Clayton MRN: [id number] DOB: 1940/06/12 Today's Date: 06/12/2019    History of present illness Mark Clayton is a 79 y.o. male who was admitted 3/9 with traumatic ICH after falling backwards onto concrete. PMH including PAF on xarelto, MI, LV thrombus, ICM, HTN, HLD, CAD, CA of sigmoid colon s/p surgery. CT (3/9) revealing a combination of left parafalcine subdural hematoma and subarachnoid hemorrhage. No acute ischemic infarct on CT.    OT comments  Pt tolerated OOB in stedy for sit<>stand total +2 mod (A) with prolonged standing > 4 minutes. Pt positioned in stedy for sink level grooming and using all objects appropriately. Pt self reports depth perception at baseline due to visual deficits but was able to catch falling container with L UE.   Follow Up Recommendations  CIR    Equipment Recommendations  Other (comment)(defer to CIR)    Recommendations for Other Services Rehab consult    Precautions / Restrictions Precautions Precautions: Fall;Other (comment) Precaution Comments: R hemiparesis, check ortho Restrictions Weight Bearing Restrictions: No       Mobility Bed Mobility Overal bed mobility: Needs Assistance Bed Mobility: Supine to Sit;Sit to Supine     Supine to sit: +2 for physical assistance;Mod assist Sit to supine: +2 for physical assistance;Max assist   General bed mobility comments: pt able to progress L side to eob but requires (A) to elevate trunk from bed surface and then static sitting balance once EOB. pt requires mod cues to initiate sit<>Supine adn total (A) to place bil LE on bed surface. pt using L UE to make R UE reach for [facility] to help with scooting to top of bed  Transfers Overall transfer level: Needs assistance   Transfers: Sit to/from Stand Sit to Stand: +2 physical assistance;Mod assist         General transfer comment: pt able to power up but with R deviation and unable to sustain  static standing. pt with R LE requires board to prevent buckle    Balance Overall balance assessment: Needs assistance Sitting-balance support: Bilateral upper extremity supported;Feet supported Sitting balance-Leahy Scale: Poor   Postural control: Right lateral lean   Standing balance-Leahy Scale: Poor                             ADL either performed or assessed with clinical judgement   ADL Overall ADL's : Needs assistance/impaired     Grooming: Moderate assistance;Sitting(stedy) Grooming Details (indicate cue type and reason): pt static sitting in stedy at sink with strong R lean. Pt requires everything setup to allow L Ue use for oral care             Lower Body Dressing: Total assistance Lower Body Dressing Details (indicate cue type and reason): pt reports "i didnt do that before all this"               General ADL Comments: pt progressed from supine to static standing in stedy. pt with R lean due to R side weakness but with cues able to initiate alignment     Vision       Perception     Praxis      Cognition Arousal/Alertness: Awake/alert Behavior During Therapy: [name] for tasks assessed/performed   Area of Impairment: Safety/judgement  Safety/Judgement: Decreased awareness of safety;Decreased awareness of deficits     General Comments: pt asking to call family and reports to family that he is in a room with no clothes and 65 degrees. therapist adjusting heat in room closing door. pt state "no i dont want it hot. yall arent listening"        Exercises     Shoulder Instructions       General Comments      Pertinent Vitals/ Pain       Pain Assessment: No/denies pain  Home Living                                          Prior Functioning/Environment              Frequency  Min 2X/week        Progress Toward Goals  OT Goals(current goals can now be found in the care  plan section)  Progress towards OT goals: Progressing toward goals  Acute Rehab OT Goals Patient Stated Goal: to go to rehab OT Goal Formulation: With family Time For Goal Achievement: 06/22/19 Potential to Achieve Goals: Good ADL Goals Pt Will Perform Grooming: with set-up;sitting Pt Will Perform Upper Body Bathing: with set-up;sitting Pt Will Transfer to Toilet: with max assist;stand pivot transfer Pt Will Perform Toileting - Clothing Manipulation and hygiene: with max assist;sit to/from stand  Plan Discharge plan remains appropriate    Co-evaluation    PT/OT/SLP Co-Evaluation/Treatment: Yes Reason for Co-Treatment: For patient/therapist safety;To address functional/ADL transfers   OT goals addressed during session: ADL's and self-care;Strengthening/ROM;Proper use of Adaptive equipment and DME      AM-PAC OT "6 Clicks" Daily Activity     Outcome Measure   Help from another person eating meals?: A Little Help from another person taking care of personal grooming?: A Lot Help from another person toileting, which includes using toliet, bedpan, or urinal?: Total Help from another person bathing (including washing, rinsing, drying)?: A Lot Help from another person to put on and taking off regular upper body clothing?: A Lot Help from another person to put on and taking off regular lower body clothing?: Total 6 Click Score: 11    End of Session Equipment Utilized During Treatment: Gait belt  OT Visit Diagnosis: Muscle weakness (generalized) (M62.81);History of falling (Z91.81);Hemiplegia and hemiparesis Hemiplegia - dominant/non-dominant: Dominant   Activity Tolerance Patient tolerated treatment well   Patient Left in bed;with call bell/phone within reach;with bed alarm set   Nurse Communication Mobility status;Precautions        Time: [id number] OT Time Calculation (min): 33 min  Charges: OT General Charges $OT Visit: 1 Visit OT Treatments $Self Care/Home  Management : 8-22 mins   Brynn, OTR/L  Acute Rehabilitation Services Pager: [phone] Office: [phone] .    Jeri Modena 06/12/2019, 3:06 PM

## 2019-06-12 NOTE — Progress Notes (Signed)
Inpatient Rehabilitation-Admissions Coordinator   Pt's insurance requested peer to peer prior to making a determination regarding CIR. Dr. Wyline Copas completed peer to peer this AM and pt has now been approved for IP Rehab. Discussed case with Dr. Wyline Copas who feels pt is not yet medically ready today for DC to CIR today.  AC will follow closely for medical readiness.   Raechel Ache, OTR/L  Rehab Admissions Coordinator  [phone] 06/12/2019 12:54 PM

## 2019-06-12 NOTE — Progress Notes (Signed)
Mews score was 2, rechecked vitals, mews now 0

## 2019-06-12 NOTE — Progress Notes (Signed)
PROGRESS NOTE    SPARTACUS SETZER  [id number] DOB: 1940-11-03 DOA: 06/06/2019 PCP: [name], MD    Brief Narrative:  [phone] with hx PAF on xarelto, MI, LV thrombus, ICM, HTN, HLD, CAD, sigmoid colon CA s/p surgery presented after falling backwards and hitting his head on the concrete, found to have acute extra-axial hemorrhage on L on CT head. Patient was initially admitted to ICU and received KCentra to reverse xarelto. Neurosurgery followed with recommendation for no surgical intervention. During workup, patient was found to have acute L MCA infarct. Stroke team consulted. Patient since transferred to hospitalist service 3/10.   Assessment & Plan:   Active Problems:   ICH (intracerebral hemorrhage) ([place])  1. ICH 1. Post-traumatic after fall and hitting concrete 2. Had been followed by Neurosurgery with no recommendation for surgical intervention, since signing off as of 3/11 3. Anticoagulation presently on hold 2. Acute L MCA CVA 1. Stroke team had been following 2. Unable to perform MRI secondary to AICD not compatible with MRI 3. Cont to hold anticoagulation per above 4. LDL 60 with A1c of 6.1 5. Per Neurology, consider resuming anticoagulation in 2-3 weeks if SDH is resolving and pt clinically stable. Pt working with therapy, fully conversant, alert and oriented. Pt reporting improvement in R hand strength 6. Therapy recommending CIR, did peer to peer this AM with insurance, who has since cleared pt for rehab. Would plan on CIR when fully diuresed, see below 3. CAD s/p CABG and stent placement 1. Denies chest pain 2. S/p prior CABG and ICD placement 3. Cath on 8/25 with findings of severe native CAD noted s/p PCI 4. Currently remains off anticoagulation and antiplatelet per above  5. Per Neurology, recommendation for resuming anticoagulation if stable in 2-3 weeks 4. PAF 1. Currently rate controlled 2. Continued on coreg 3. Plans to resume anticoagulation per  above 5. HTN 1. BP had remained stable at present 2. Cont on current regimen and titrate as pt tolerates 6. HLD 1. LD of 60 2. Continue on statin as tolerated 7. Hx colon cancer 1. S/p sigmoidectomy as well as later R hemicolectomy in 8/14 2. Followed by GI Velora Heckler) as well as General Surgery 3. Chart reviewed, noted to be extremely high risk for future colon polyps 4. Presently stable 8. Hx LV thrombus 1. Seems stable currently 2. Per above, plan anticoagulation when pt stable 9. Dehydration 1. Urine appeared concentrated earlier with concern of mild dehydration 2. Noted to have improved following hydration 3. Given hx systolic CHF, IVF now d/c'd 10. Acute on Chronic systolic CHF 1. 6kg wt gain since admit noted 2. Tachypnea noted on exam 3. Now off IVF, however, pt did require brief fluids for dehydration earlier this visit 4. Will transition to IV lasix. Thus far, 1100cc urine output since this AM 5. Cont IV lasix and monitor daily wt and I/o 6. Transition back to PO lasix when fully diuresed and at or near dry weight 7. Have discussed case with Cardiology on call who agrees with plan  DVT prophylaxis: SCD's Code Status: Full Family Communication: Pt in room, family at bedside Disposition Plan: From home, plan CIR when fully diuresed  Consultants:   PCCM  Neurosurgery  Neurology  CIR  Discussed with Cardiology on call  Procedures:     Antimicrobials: Anti-infectives (From admission, onward)   None      Subjective: Denies feeling sob however pt with some tachypnea  Objective: Vitals:   06/12/19 [id number] 06/12/19 0846  06/12/19 1102 06/12/19 1300  BP: (!) 151/97 140/75 131/88   Pulse: 87 90 84   Resp: (!) 26 20  (!) 23  Temp:   97.8 F (36.6 C)   TempSrc: Axillary  Oral   SpO2: 99% 97% 96%   Weight:        Intake/Output Summary (Last 24 hours) at 06/12/2019 1423 Last data filed at 06/12/2019 1300 Gross per 24 hour  Intake --  Output 1750 ml  Net  -1750 ml   Filed Weights   06/10/19 0456 06/11/19 0416 06/12/19 0400  Weight: 78.5 kg 79 kg 78.1 kg    Examination: General exam: Awake, laying in bed, in nad, fully and appropriately conversant Respiratory system: increased resp effort, no wheezing Cardiovascular system: regular rate, s1, s2 Gastrointestinal system: Soft, nondistended, positive BS Central nervous system: CN2-12 grossly intact, R sided weakness Extremities: Perfused, no clubbing Skin: Normal skin turgor, no notable skin lesions seen Psychiatry: Mood normal // no visual hallucinations   Data Reviewed: I have personally reviewed following labs and imaging studies  CBC: Recent Labs  Lab 06/06/19 1549 06/06/19 1551 06/07/19 0540 06/08/19 0509  WBC 5.9  --  9.8 8.3  NEUTROABS 3.5  --   --   --   HGB 12.2* 12.2* 11.6* 12.0*  HCT 37.6* 36.0* 35.4* 36.6*  MCV 88.3  --  86.8 88.4  PLT 176  --  177 [id number]   Basic Metabolic Panel: Recent Labs  Lab 06/06/19 1549 06/06/19 1549 06/06/19 1551 06/07/19 0540 06/08/19 0509 06/09/19 0448 06/10/19 0426  NA 141   < > 142 144 141 142 139  K 3.6   < > 3.5 3.1* 3.4* 3.6 4.0  CL 107   < > 106 111 108 104 106  CO2 25  --   --  23 23 24  21*  GLUCOSE 133*   < > 124* 125* 134* 114* 116*  BUN 13   < > 12 10 14 22  25*  CREATININE 1.27*   < > 1.20 1.01 1.20 1.12 1.07  CALCIUM 9.1  --   --  8.9 8.9 9.1 8.7*  MG  --   --   --  1.9 2.2  --   --   PHOS  --   --   --  1.8* 3.0  --   --    < > = values in this interval not displayed.   GFR: Estimated Creatinine Clearance: 62.5 mL/min (by C-G formula based on SCr of 1.07 mg/dL). Liver Function Tests: Recent Labs  Lab 06/06/19 1549 06/09/19 0448 06/10/19 0426  AST 17 31 38  ALT 11 16 25   ALKPHOS 49 38 36*  BILITOT 1.2 1.9* 1.9*  PROT 6.2* 5.7* 5.3*  ALBUMIN 3.6 3.0* 2.9*   No results for input(s): LIPASE, AMYLASE in the last 168 hours. No results for input(s): AMMONIA in the last 168 hours. Coagulation Profile: Recent  Labs  Lab 06/06/19 1549  INR 1.1   Cardiac Enzymes: No results for input(s): CKTOTAL, CKMB, CKMBINDEX, TROPONINI in the last 168 hours. BNP (last 3 results) Recent Labs    08/26/18 0856 03/13/19 1028  PROBNP 382.0* 410.0*   HbA1C: No results for input(s): HGBA1C in the last 72 hours. CBG: Recent Labs  Lab 06/06/19 1542  GLUCAP 119*   Lipid Profile: No results for input(s): CHOL, HDL, LDLCALC, TRIG, CHOLHDL, LDLDIRECT in the last 72 hours. Thyroid Function Tests: No results for input(s): TSH, T4TOTAL, FREET4, T3FREE, THYROIDAB in the last  72 hours. Anemia Panel: No results for input(s): VITAMINB12, FOLATE, FERRITIN, TIBC, IRON, RETICCTPCT in the last 72 hours. Sepsis Labs: No results for input(s): PROCALCITON, LATICACIDVEN in the last 168 hours.  Recent Results (from the past 240 hour(s))  Novel Coronavirus, NAA ([facility])     Status: None   Collection Time: 06/02/19  7:17 PM   Specimen: Nasopharyngeal(NP) swabs in vial transport medium   NASOPHARYNGE  IS THIS  Result Value Ref Range Status   SARS-CoV-2, NAA Not Detected Not Detected Final    Comment: Testing was performed using the cobas(R) SARS-CoV-2 test. This nucleic acid amplification test was developed and its performance characteristics determined by [employer]. Nucleic acid amplification tests include RT-PCR and TMA. This test has not been FDA cleared or approved. This test has been authorized by FDA under an Emergency Use Authorization (EUA). This test is only authorized for the duration of time the declaration that circumstances exist justifying the authorization of the emergency use of in vitro diagnostic tests for detection of SARS-CoV-2 virus and/or diagnosis of COVID-19 infection under section 564(b)(1) of the Act, 21 U.S.C. [id number]) (1), unless the authorization is terminated or revoked sooner. When diagnostic testing is negative, the possibility of a false negative result should be  considered in the context of a patient's recent exposures and the presence of clinical signs and symptoms consistent with COVID-19. An individual without sympto ms of COVID-19 and who is not shedding SARS-CoV-2 virus would expect to have a negative (not detected) result in this assay.   Respiratory Panel by RT PCR (Flu A&B, Covid) - Nasopharyngeal Swab     Status: None   Collection Time: 06/06/19  4:05 PM   Specimen: Nasopharyngeal Swab  Result Value Ref Range Status   SARS Coronavirus 2 by RT PCR NEGATIVE NEGATIVE Final    Comment: (NOTE) SARS-CoV-2 target nucleic acids are NOT DETECTED. The SARS-CoV-2 RNA is generally detectable in upper respiratoy specimens during the acute phase of infection. The lowest concentration of SARS-CoV-2 viral copies this assay can detect is 131 copies/mL. A negative result does not preclude SARS-Cov-2 infection and should not be used as the sole basis for treatment or other patient management decisions. A negative result may occur with  improper specimen collection/handling, submission of specimen other than nasopharyngeal swab, presence of viral mutation(s) within the areas targeted by this assay, and inadequate number of viral copies (<131 copies/mL). A negative result must be combined with clinical observations, patient history, and epidemiological information. The expected result is Negative. Fact Sheet for Patients:  PinkCheek.be Fact Sheet for Healthcare Providers:  GravelBags.it This test is not yet ap proved or cleared by the [place] FDA and  has been authorized for detection and/or diagnosis of SARS-CoV-2 by FDA under an Emergency Use Authorization (EUA). This EUA will remain  in effect (meaning this test can be used) for the duration of the COVID-19 declaration under Section 564(b)(1) of the Act, 21 U.S.C. section 360bbb-3(b)(1), unless the authorization is terminated or revoked  sooner.    Influenza A by PCR NEGATIVE NEGATIVE Final   Influenza B by PCR NEGATIVE NEGATIVE Final    Comment: (NOTE) The Xpert Xpress SARS-CoV-2/FLU/RSV assay is intended as an aid in  the diagnosis of influenza from Nasopharyngeal swab specimens and  should not be used as a sole basis for treatment. Nasal washings and  aspirates are unacceptable for Xpert Xpress SARS-CoV-2/FLU/RSV  testing. Fact Sheet for Patients: PinkCheek.be Fact Sheet for Healthcare Providers: GravelBags.it This test is  not yet approved or cleared by the [place] and  has been authorized for detection and/or diagnosis of SARS-CoV-2 by  FDA under an Emergency Use Authorization (EUA). This EUA will remain  in effect (meaning this test can be used) for the duration of the  Covid-19 declaration under Section 564(b)(1) of the Act, 21  U.S.C. section 360bbb-3(b)(1), unless the authorization is  terminated or revoked. Performed at [facility], [place] [address] [place], [place] [address]   MRSA PCR Screening     Status: None   Collection Time: 06/06/19  8:08 PM   Specimen: Nasal Mucosa; Nasopharyngeal  Result Value Ref Range Status   MRSA by PCR NEGATIVE NEGATIVE Final    Comment:        The GeneXpert MRSA Assay (FDA approved for NASAL specimens only), is one component of a comprehensive MRSA colonization surveillance program. It is not intended to diagnose MRSA infection nor to guide or monitor treatment for MRSA infections. Performed at [facility], [place] [address], [place], [place] [address]      Radiology Studies: DG CHEST PORT 1 VIEW  Result Date: 06/12/2019 EXAM: PORTABLE CHEST 1 VIEW COMPARISON:  None. FINDINGS: LEFT-sided pacemaker overlies normal cardiac silhouette. Sternotomy wires noted. There is central venous congestion and patchy airspace disease new from 08/07/2019. no pneumothorax. No focal consolidation.  IMPRESSION: New increasing bilateral airspace disease suggests pulmonary edema. Electronically Signed   By: Suzy Bouchard M.D.   On: 06/12/2019 10:16    Scheduled Meds: . atorvastatin  80 mg Oral q1800  . carvedilol  12.5 mg Oral BID WC  . chlorhexidine  15 mL Mouth Rinse BID  . Chlorhexidine Gluconate Cloth  6 each Topical Daily  . ezetimibe  10 mg Oral Daily  . furosemide  40 mg Oral Daily  . isosorbide mononitrate  30 mg Oral Daily  . mouth rinse  15 mL Mouth Rinse q12n4p  . pantoprazole  40 mg Oral Daily  . polyethylene glycol  17 g Oral Daily  . vitamin B-12  100 mcg Oral Daily   Continuous Infusions:    LOS: 6 days   Marylu Lund, MD [facility] Pager On Amion  If 7PM-7AM, please contact night-coverage 06/12/2019, 2:23 PM

## 2019-06-12 NOTE — Progress Notes (Signed)
Physical Therapy Treatment Patient Details Name: Mark Clayton MRN: [id number] DOB: 04/19/1940 Today's Date: 06/12/2019    History of Present Illness Mark Clayton is a 79 y.o. male who was admitted 3/9 with traumatic ICH after falling backwards onto concrete. PMH including PAF on xarelto, MI, LV thrombus, ICM, HTN, HLD, CAD, CA of sigmoid colon s/p surgery. CT (3/9) revealing a combination of left parafalcine subdural hematoma and subarachnoid hemorrhage. No acute ischemic infarct on CT.     PT Comments    Pt supine in bed on arrival this session.  He appears confused this session and reports his room is too cold and he has on no clothes.  He required assistance to place gown and temperature was adjusted.  He later reported he wanted to keep the room and that we," weren't listening".  He required assistance to mobilize OOB to stand.  In standing he presents with R sided lean.  He does have more distance strength in R hand this session and also took a step with R LE to the L in an adduction movement.  Continue to recommend aggressive CIR therapies to improve strength, function and cognition.         Follow Up Recommendations  CIR;Supervision/Assistance - 24 hour     Equipment Recommendations  Wheelchair (measurements PT);Wheelchair cushion (measurements PT)    Recommendations for Other Services Rehab consult     Precautions / Restrictions Precautions Precautions: Fall;Other (comment) Precaution Comments: R hemiparesis, check orthostatics Restrictions Weight Bearing Restrictions: No    Mobility  Bed Mobility Overal bed mobility: Needs Assistance Bed Mobility: Supine to Sit;Sit to Supine     Supine to sit: +2 for physical assistance;Mod assist Sit to supine: +2 for physical assistance;Max assist   General bed mobility comments: pt able to progress L side to eob but requires (A) to elevate trunk from bed surface and then static sitting balance once EOB. pt requires mod cues  to initiate sit<>Supine adn total (A) to place bil LE on bed surface. pt using L UE to make R UE reach for Mark Clayton to help with scooting to top of bed  Transfers Overall transfer level: Needs assistance Equipment used: Ambulation equipment used(stedy) Transfers: Sit to/from Stand Sit to Stand: +2 physical assistance;Mod assist         General transfer comment: pt able to power up but with R deviation and unable to sustain static standing. pt with R LE requires board to prevent buckle  Ambulation/Gait Ambulation/Gait assistance: (NT)               Stairs             Wheelchair Mobility    Modified Rankin (Stroke Patients Only)       Balance Overall balance assessment: Needs assistance Sitting-balance support: Bilateral upper extremity supported;Feet supported Sitting balance-Leahy Scale: Poor   Postural control: Right lateral lean   Standing balance-Leahy Scale: Poor Standing balance comment: Strong R lateral push/lean.  Required facilitation to correct position.                            Cognition Arousal/Alertness: Awake/alert Behavior During Therapy: WFL for tasks assessed/performed Overall Cognitive Status: Difficult to assess Area of Impairment: Safety/judgement                         Safety/Judgement: Decreased awareness of safety;Decreased awareness of deficits     General Comments:  pt asking to call family and reports to family that he is in a room with no clothes and 65 degrees. therapist adjusting heat in room closing door. pt state "no i dont want it hot. yall arent listening"      Exercises      General Comments        Pertinent Vitals/Pain Pain Assessment: No/denies pain    Home Living                      Prior Function            PT Goals (current goals can now be found in the care plan section) Acute Rehab PT Goals Patient Stated Goal: to go to rehab Potential to Achieve Goals: Fair Progress  towards PT goals: Progressing toward goals    Frequency    Min 4X/week      PT Plan Current plan remains appropriate    Co-evaluation PT/OT/SLP Co-Evaluation/Treatment: Yes Reason for Co-Treatment: Complexity of the patient's impairments (multi-system involvement) PT goals addressed during session: Mobility/safety with mobility OT goals addressed during session: ADL's and self-care      AM-PAC PT "6 Clicks" Mobility   Outcome Measure  Help needed turning from your back to your side while in a flat bed without using bedrails?: Total Help needed moving from lying on your back to sitting on the side of a flat bed without using bedrails?: Total Help needed moving to and from a bed to a chair (including a wheelchair)?: Total Help needed standing up from a chair using your arms (e.g., wheelchair or bedside chair)?: Total Help needed to walk in Clayton room?: Total Help needed climbing 3-5 steps with a railing? : Total 6 Click Score: 6    End of Session Equipment Utilized During Treatment: Gait belt Activity Tolerance: Patient tolerated treatment well Patient left: in chair;with call bell/phone within reach;with chair alarm set Nurse Communication: Mobility status PT Visit Diagnosis: Other symptoms and signs involving the nervous system (R29.898);Hemiplegia and hemiparesis;Other abnormalities of gait and mobility (R26.89) Hemiplegia - Right/Left: Right Hemiplegia - dominant/non-dominant: Dominant     Time: 1349-1420 PT Time Calculation (min) (ACUTE ONLY): 31 min  Charges:  $Therapeutic Activity: 8-22 mins                     Mark Clayton , PTA Acute Rehabilitation Services Pager [phone] Office [phone]     Mark Clayton 06/12/2019, 4:39 PM

## 2019-06-12 NOTE — Progress Notes (Signed)
  Speech Language Pathology Treatment: Dysphagia;Cognitive-Linquistic  Patient Details Name: Mark Clayton MRN: [id number] DOB: 01/27/1941 Today's Date: 06/12/2019 Time: [id number] SLP Time Calculation (min) (ACUTE ONLY): 30 min  Assessment / Plan / Recommendation Clinical Impression  Skilled observation with pos complete. Patient able to consume dysphagia 3 solids and thin liquids without overt s/s of aspiration however continues with poor recall of compensatory strategies provided following MBS, requiring max cueing for use during po intake. Overall good tolerance of diet based on vital signs and pulmonary function. Would recommend continued trials of upgraded solids for potential upgrade when patient able to better utilize strategies to mitigate aspiration risks.   Speech treatment focused on dysarthria and aphasia. Provided patient with dysarthria strategies which patient was able to utilize with moderate cueing for carryover into basic conversation in order to reach approximately 75% intelligibility. Additionally, SLP provided min phonemic cues for word retrieval during conversation which was effective in improving verbal output. Patient becoming noticeably frustrated during conversation with word retrieval deficits however independently pauses and used gestures, both of which are also effective at improving verbal communication.   Grandaughter present. SLP provided education on compensatory strategies for dysphagia, aphasia, and dysarthria. She verbalized understanding.     HPI HPI: Mark Clayton is a 79 y.o. male who was admitted 3/9 with traumatic ICH. PMH including but not limited to PAF on xarelto, MI, LV thrombus, ICM, HTN, HLD, CAD, CA of sigmoid colon s/p surgery (see "past medical history" for rest). CT (3/9) revealing a combination of left parafalcine subdural hematoma and subarachnoid hemorrhage. This is related to trauma given history of fall. No acute ischemic infarct on CT.  Presents with Expressive > receptive apahsia. H/o GERD ad coughing.       SLP Plan  Continue with current plan of care       Recommendations  Diet recommendations: Dysphagia 2 (fine chop);Thin liquid Medication Administration: Crushed with puree Supervision: Patient able to self feed;Full supervision/cueing for compensatory strategies Compensations: Minimize environmental distractions;Slow rate;Small sips/bites;Multiple dry swallows after each bite/sip;Follow solids with liquid;Effortful swallow;Clear throat intermittently Postural Changes and/or Swallow Maneuvers: Seated upright 90 degrees                Oral Care Recommendations: Oral care BID;Staff/trained caregiver to provide oral care Follow up Recommendations: Inpatient Rehab;24 hour supervision/assistance SLP Visit Diagnosis: Dysphagia, oropharyngeal phase (R13.12);Aphasia (R47.01) Plan: Continue with current plan of care       GO              Mark Holian MA, CCC-SLP    Mark Clayton Mark Clayton 06/12/2019, 11:14 AM

## 2019-06-13 LAB — COMPREHENSIVE METABOLIC PANEL
ALT: 26 U/L (ref 0–44)
AST: 23 U/L (ref 15–41)
Albumin: 2.8 g/dL — ABNORMAL LOW (ref 3.5–5.0)
Alkaline Phosphatase: 39 U/L (ref 38–126)
Anion gap: 13 (ref 5–15)
BUN: 17 mg/dL (ref 8–23)
CO2: 24 mmol/L (ref 22–32)
Calcium: 8.7 mg/dL — ABNORMAL LOW (ref 8.9–10.3)
Chloride: 102 mmol/L (ref 98–111)
Creatinine, Ser: 1.04 mg/dL (ref 0.61–1.24)
GFR calc Af Amer: >60 mL/min (ref >60)
GFR calc non Af Amer: >60 mL/min (ref >60)
Glucose, Bld: 132 mg/dL — ABNORMAL HIGH (ref 70–99)
Potassium: 3.2 mmol/L — ABNORMAL LOW (ref 3.5–5.1)
Sodium: 139 mmol/L (ref 135–145)
Total Bilirubin: 2.3 mg/dL — ABNORMAL HIGH (ref 0.3–1.2)
Total Protein: 5.5 g/dL — ABNORMAL LOW (ref 6.5–8.1)

## 2019-06-13 MED ORDER — POTASSIUM CHLORIDE CRYS ER 20 MEQ PO TBCR
40.0000 meq | EXTENDED_RELEASE_TABLET | Freq: Two times a day (BID) | ORAL | Status: AC
  Administered 2019-06-13 (×2): 40 meq via ORAL
  Filled 2019-06-13 (×2): qty 2

## 2019-06-13 MED ORDER — FUROSEMIDE 10 MG/ML IJ SOLN
40.0000 mg | Freq: Two times a day (BID) | INTRAMUSCULAR | Status: DC
  Administered 2019-06-13 – 2019-06-14 (×2): 40 mg via INTRAVENOUS
  Filled 2019-06-13 (×2): qty 4

## 2019-06-13 NOTE — Progress Notes (Signed)
Inpatient Rehabilitation-Admissions Coordinator   Per Dr. Wyline Copas, pt may need one more day on acute prior to admit to CIR. AC will follow up tomorrow for possible admit.   Raechel Ache, OTR/L  Rehab Admissions Coordinator  [phone] 06/13/2019 12:48 PM

## 2019-06-13 NOTE — Progress Notes (Signed)
PROGRESS NOTE    Mark Clayton  [id number] DOB: 19-Dec-1940 DOA: 06/06/2019 PCP: [name], MD    Brief Narrative:  [phone] with hx PAF on xarelto, MI, LV thrombus, ICM, HTN, HLD, CAD, sigmoid colon CA s/p surgery presented after falling backwards and hitting his head on the concrete, found to have acute extra-axial hemorrhage on L on CT head. Patient was initially admitted to ICU and received KCentra to reverse xarelto. Neurosurgery followed with recommendation for no surgical intervention. During workup, patient was found to have acute L MCA infarct. Stroke team consulted. Patient since transferred to hospitalist service 3/10.   Assessment & Plan:   Active Problems:   ICH (intracerebral hemorrhage) ([place])  1. ICH 1. Post-traumatic after fall and hitting concrete 2. Had been followed by Neurosurgery with no recommendation for surgical intervention, since signing off as of 3/11 3. Anticoagulation presently remains on hold 2. Acute L MCA CVA 1. Stroke team had been following 2. Unable to perform MRI secondary to AICD not compatible with MRI 3. Cont to hold anticoagulation per above 4. LDL 60 with A1c of 6.1 5. Per Neurology, consider resuming anticoagulation in 2-3 weeks if SDH is resolving and pt clinically stable. Pt working with therapy, fully conversant, alert and oriented. Pt reporting improvement in R hand strength 6. Therapy recommending CIR, did peer to peer with insurance on 3/15, who has since cleared pt for rehab. Would plan on CIR when fully diuresed, see below 3. CAD s/p CABG and stent placement 1. Denies chest pain 2. S/p prior CABG and ICD placement 3. Cath on 8/25 with findings of severe native CAD noted s/p PCI 4. Currently remains off anticoagulation and antiplatelet per above  5. Per Neurology, recommendation for resuming anticoagulation if stable in 2-3 weeks if stable 4. PAF 1. Currently rate controlled 2. Continued on coreg 3. Plans for resuming  anticoagulation per above 5. HTN 1. BP had remained stable at present 2. Cont on current regimen and titrate as pt tolerates 6. HLD 1. LD of 60 2. Continue on statin as tolerated 7. Hx colon cancer 1. S/p sigmoidectomy as well as later R hemicolectomy in 8/14 2. Followed by GI Velora Heckler) as well as General Surgery 3. Chart reviewed, noted to be extremely high risk for future colon polyps 4. Stable at presently 8. Hx LV thrombus 1. Seems stable currently 2. Per above, plan anticoagulation when pt stable 9. Dehydration 1. Urine appeared concentrated earlier this admit with concern of mild dehydration 2. Improved with gentle IVF, however now clinically volume overloaded per below 10. Acute on Chronic systolic CHF 1. Pt reports dry wt of 163kg (74kg), currently 76kg 2. Peak wt of 79kg this admit 3. CXR earlier reviewed with findings suggestive of volume overload and pulm edema 4. Diuresed over 3700cc overnight 5. Cont IV lasix and monitor daily wt and I/o. Will increase to BID IV lasix 6. Transition back to PO lasix when fully diuresed and at or near dry weight 7. Have discussed case with Cardiology on call who agrees with plan  DVT prophylaxis: SCD's Code Status: Full Family Communication: Pt in room, family at bedside Disposition Plan: From home, plan CIR when fully diuresed  Consultants:   PCCM  Neurosurgery  Neurology  CIR  Discussed with Cardiology on call  Procedures:     Antimicrobials: Anti-infectives (From admission, onward)   None      Subjective: Reports feeling better. Breathing better  Objective: Vitals:   06/13/19 0410 06/13/19 0455  06/13/19 0456 06/13/19 0814  BP: (!) 145/78 139/86  139/75  Pulse: 85   100  Resp: 18 16  16   Temp: 97.8 F (36.6 C)   (!) 97.5 F (36.4 C)  TempSrc: Oral   Oral  SpO2: 93%   98%  Weight:   76.8 kg     Intake/Output Summary (Last 24 hours) at 06/13/2019 1506 Last data filed at 06/13/2019 1413 Gross per 24  hour  Intake 400 ml  Output 2650 ml  Net -2250 ml   Filed Weights   06/11/19 0416 06/12/19 0400 06/13/19 0456  Weight: 79 kg 78.1 kg 76.8 kg    Examination: General exam: Conversant, in no acute distress Respiratory system: normal chest rise, clear, no audible wheezing Cardiovascular system: regular rhythm, s1-s2 Gastrointestinal system: Nondistended, nontender, pos BS Central nervous system: No seizures, no tremors, residual R sided weakness but now able to move fingers more easily Extremities: No cyanosis, no joint deformities Skin: No rashes, no pallor Psychiatry: Affect normal // no auditory hallucinations   Data Reviewed: I have personally reviewed following labs and imaging studies  CBC: Recent Labs  Lab 06/06/19 1549 06/06/19 1551 06/07/19 0540 06/08/19 0509  WBC 5.9  --  9.8 8.3  NEUTROABS 3.5  --   --   --   HGB 12.2* 12.2* 11.6* 12.0*  HCT 37.6* 36.0* 35.4* 36.6*  MCV 88.3  --  86.8 88.4  PLT 176  --  177 [id number]   Basic Metabolic Panel: Recent Labs  Lab 06/07/19 0540 06/08/19 0509 06/09/19 0448 06/10/19 0426 06/13/19 0356  NA 144 141 142 139 139  K 3.1* 3.4* 3.6 4.0 3.2*  CL 111 108 104 106 102  CO2 23 23 24  21* 24  GLUCOSE 125* 134* 114* 116* 132*  BUN 10 14 22  25* 17  CREATININE 1.01 1.20 1.12 1.07 1.04  CALCIUM 8.9 8.9 9.1 8.7* 8.7*  MG 1.9 2.2  --   --   --   PHOS 1.8* 3.0  --   --   --    GFR: Estimated Creatinine Clearance: 63.6 mL/min (by C-G formula based on SCr of 1.04 mg/dL). Liver Function Tests: Recent Labs  Lab 06/06/19 1549 06/09/19 0448 06/10/19 0426 06/13/19 0356  AST 17 31 38 23  ALT 11 16 25 26   ALKPHOS 49 38 36* 39  BILITOT 1.2 1.9* 1.9* 2.3*  PROT 6.2* 5.7* 5.3* 5.5*  ALBUMIN 3.6 3.0* 2.9* 2.8*   No results for input(s): LIPASE, AMYLASE in the last 168 hours. No results for input(s): AMMONIA in the last 168 hours. Coagulation Profile: Recent Labs  Lab 06/06/19 1549  INR 1.1   Cardiac Enzymes: No results for  input(s): CKTOTAL, CKMB, CKMBINDEX, TROPONINI in the last 168 hours. BNP (last 3 results) Recent Labs    08/26/18 0856 03/13/19 1028  PROBNP 382.0* 410.0*   HbA1C: No results for input(s): HGBA1C in the last 72 hours. CBG: Recent Labs  Lab 06/06/19 1542  GLUCAP 119*   Lipid Profile: No results for input(s): CHOL, HDL, LDLCALC, TRIG, CHOLHDL, LDLDIRECT in the last 72 hours. Thyroid Function Tests: No results for input(s): TSH, T4TOTAL, FREET4, T3FREE, THYROIDAB in the last 72 hours. Anemia Panel: No results for input(s): VITAMINB12, FOLATE, FERRITIN, TIBC, IRON, RETICCTPCT in the last 72 hours. Sepsis Labs: No results for input(s): PROCALCITON, LATICACIDVEN in the last 168 hours.  Recent Results (from the past 240 hour(s))  Respiratory Panel by RT PCR (Flu A&B, Covid) - Nasopharyngeal Swab  Status: None   Collection Time: 06/06/19  4:05 PM   Specimen: Nasopharyngeal Swab  Result Value Ref Range Status   SARS Coronavirus 2 by RT PCR NEGATIVE NEGATIVE Final    Comment: (NOTE) SARS-CoV-2 target nucleic acids are NOT DETECTED. The SARS-CoV-2 RNA is generally detectable in upper respiratoy specimens during the acute phase of infection. The lowest concentration of SARS-CoV-2 viral copies this assay can detect is 131 copies/mL. A negative result does not preclude SARS-Cov-2 infection and should not be used as the sole basis for treatment or other patient management decisions. A negative result may occur with  improper specimen collection/handling, submission of specimen other than nasopharyngeal swab, presence of viral mutation(s) within the areas targeted by this assay, and inadequate number of viral copies (<131 copies/mL). A negative result must be combined with clinical observations, patient history, and epidemiological information. The expected result is Negative. Fact Sheet for Patients:  PinkCheek.be Fact Sheet for Healthcare Providers:    GravelBags.it This test is not yet ap proved or cleared by the [place] FDA and  has been authorized for detection and/or diagnosis of SARS-CoV-2 by FDA under an Emergency Use Authorization (EUA). This EUA will remain  in effect (meaning this test can be used) for the duration of the COVID-19 declaration under Section 564(b)(1) of the Act, 21 U.S.C. section 360bbb-3(b)(1), unless the authorization is terminated or revoked sooner.    Influenza A by PCR NEGATIVE NEGATIVE Final   Influenza B by PCR NEGATIVE NEGATIVE Final    Comment: (NOTE) The Xpert Xpress SARS-CoV-2/FLU/RSV assay is intended as an aid in  the diagnosis of influenza from Nasopharyngeal swab specimens and  should not be used as a sole basis for treatment. Nasal washings and  aspirates are unacceptable for Xpert Xpress SARS-CoV-2/FLU/RSV  testing. Fact Sheet for Patients: PinkCheek.be Fact Sheet for Healthcare Providers: GravelBags.it This test is not yet approved or cleared by the [place] FDA and  has been authorized for detection and/or diagnosis of SARS-CoV-2 by  FDA under an Emergency Use Authorization (EUA). This EUA will remain  in effect (meaning this test can be used) for the duration of the  Covid-19 declaration under Section 564(b)(1) of the Act, 21  U.S.C. section 360bbb-3(b)(1), unless the authorization is  terminated or revoked. Performed at [facility], [place] [address], [place], [place] [address]   MRSA PCR Screening     Status: None   Collection Time: 06/06/19  8:08 PM   Specimen: Nasal Mucosa; Nasopharyngeal  Result Value Ref Range Status   MRSA by PCR NEGATIVE NEGATIVE Final    Comment:        The GeneXpert MRSA Assay (FDA approved for NASAL specimens only), is one component of a comprehensive MRSA colonization surveillance program. It is not intended to diagnose MRSA infection nor to guide  or monitor treatment for MRSA infections. Performed at [facility], [place] [address], [place], [place] [address]      Radiology Studies: DG CHEST PORT 1 VIEW  Result Date: 06/12/2019 EXAM: PORTABLE CHEST 1 VIEW COMPARISON:  None. FINDINGS: LEFT-sided pacemaker overlies normal cardiac silhouette. Sternotomy wires noted. There is central venous congestion and patchy airspace disease new from 08/07/2019. no pneumothorax. No focal consolidation. IMPRESSION: New increasing bilateral airspace disease suggests pulmonary edema. Electronically Signed   By: Suzy Bouchard M.D.   On: 06/12/2019 10:16    Scheduled Meds: . atorvastatin  80 mg Oral q1800  . carvedilol  12.5 mg Oral BID  WC  . chlorhexidine  15 mL Mouth Rinse BID  . Chlorhexidine Gluconate Cloth  6 each Topical Daily  . ezetimibe  10 mg Oral Daily  . furosemide  40 mg Intravenous BID  . isosorbide mononitrate  30 mg Oral Daily  . mouth rinse  15 mL Mouth Rinse q12n4p  . pantoprazole  40 mg Oral Daily  . polyethylene glycol  17 g Oral Daily  . potassium chloride  40 mEq Oral BID  . vitamin B-12  100 mcg Oral Daily   Continuous Infusions:    LOS: 7 days   Marylu Lund, MD [facility] Pager On Amion  If 7PM-7AM, please contact night-coverage 06/13/2019, 3:06 PM

## 2019-06-13 NOTE — Progress Notes (Signed)
Physical Therapy Treatment Patient Details Name: Mark Clayton MRN: [id number] DOB: Aug 06, 1940 Today's Date: 06/13/2019    History of Present Illness Mark Clayton is a 79 y.o. male who was admitted 3/9 with traumatic ICH after falling backwards onto concrete. PMH including PAF on xarelto, MI, LV thrombus, ICM, HTN, HLD, CAD, CA of sigmoid colon s/p surgery. CT (3/9) revealing a combination of left parafalcine subdural hematoma and subarachnoid hemorrhage. No acute ischemic infarct on CT.     PT Comments    Pt performed treatment this pm with focus on transfer training, anterior translation and righting response.  He continues to present with lean to the R but the pushing is not as prominent as it has been in previous session.  He continues to benefit from aggressive CIR therapies to improve strength and function before returning home.     Follow Up Recommendations  CIR;Supervision/Assistance - 24 hour     Equipment Recommendations  Wheelchair (measurements PT);Wheelchair cushion (measurements PT)    Recommendations for Other Services       Precautions / Restrictions Precautions Precautions: Fall;Other (comment) Precaution Comments: R hemiparesis, check orthostatics Restrictions Weight Bearing Restrictions: No    Mobility  Bed Mobility Overal bed mobility: Needs Assistance Bed Mobility: Rolling;Sidelying to Sit Rolling: Mod assist;+2 for physical assistance Sidelying to sit: Mod assist;+2 for physical assistance       General bed mobility comments: Pt required cues for hand and foot placement to roll to R side of bed.  He was able to reach for railing with L hand and required assistance to advance [name] to edge of bed.  Once in this position he required min +2 to elevate trunk into sitting and mod +2 to maintain sitting.  Pt bracing with L hand on foot board with posterior lean.  Able to correct with cueing and facilitation.  Transfers Overall transfer level: Needs  assistance Equipment used: Ambulation equipment used(stedy) Transfers: Sit to/from Stand Sit to Stand: Mod assist;+2 physical assistance         General transfer comment: Pt able to power up with less deviation to the R.  Listing remains to the right but not as prominent as it has been in previous session.  Focused on sit to stand reps x 5 from stedy plates and able to move RLE toward extension.  Ambulation/Gait Ambulation/Gait assistance: (NT)               Stairs             Wheelchair Mobility    Modified Rankin (Stroke Patients Only)       Balance Overall balance assessment: Needs assistance Sitting-balance support: Bilateral upper extremity supported;Feet supported Sitting balance-Leahy Scale: Poor       Standing balance-Leahy Scale: Poor Standing balance comment: R lateral lean.  Required facilitation to correct position.                            Cognition Arousal/Alertness: Awake/alert Behavior During Therapy: WFL for tasks assessed/performed Overall Cognitive Status: Impaired/Different from baseline Area of Impairment: Safety/judgement                     Memory: Decreased short-term memory Following Commands: Follows one step commands inconsistently Safety/Judgement: Decreased awareness of safety;Decreased awareness of deficits            Exercises      General Comments        Pertinent  Vitals/Pain Pain Assessment: No/denies pain Faces Pain Scale: No hurt    Home Living                      Prior Function            PT Goals (current goals can now be found in the care plan section) Acute Rehab PT Goals Patient Stated Goal: to go to rehab Potential to Achieve Goals: Fair Progress towards PT goals: Progressing toward goals    Frequency    Min 4X/week      PT Plan Current plan remains appropriate    Co-evaluation              AM-PAC PT "6 Clicks" Mobility   Outcome Measure   Help needed turning from your back to your side while in a flat bed without using bedrails?: A Lot Help needed moving from lying on your back to sitting on the side of a flat bed without using bedrails?: A Lot Help needed moving to and from a bed to a chair (including a wheelchair)?: A Lot Help needed standing up from a chair using your arms (e.g., wheelchair or bedside chair)?: A Lot Help needed to walk in hospital room?: Total Help needed climbing 3-5 steps with a railing? : Total 6 Click Score: 10    End of Session Equipment Utilized During Treatment: Gait belt Activity Tolerance: Patient tolerated treatment well Patient left: in chair;with call bell/phone within reach;with chair alarm set Nurse Communication: Mobility status PT Visit Diagnosis: Other symptoms and signs involving the nervous system (R29.898);Hemiplegia and hemiparesis;Other abnormalities of gait and mobility (R26.89) Hemiplegia - Right/Left: Right Hemiplegia - dominant/non-dominant: Dominant     Time: [id number] PT Time Calculation (min) (ACUTE ONLY): 21 min  Charges:  $Therapeutic Activity: 8-22 mins                     Erasmo Leventhal , PTA Acute Rehabilitation Services Pager [phone] Office [phone]     Mark Clayton 06/13/2019, 2:45 PM

## 2019-06-14 ENCOUNTER — Other Ambulatory Visit: Payer: Self-pay

## 2019-06-14 ENCOUNTER — Inpatient Hospital Stay (HOSPITAL_COMMUNITY)
Admission: RE | Admit: 2019-06-14 | Discharge: 2019-07-08 | DRG: 057 | Disposition: A | Discharge status: Home Health | Payer: Medicare Other | Source: Intra-hospital | Attending: Physical Medicine & Rehabilitation | Admitting: Physical Medicine & Rehabilitation

## 2019-06-14 ENCOUNTER — Telehealth: Payer: Medicare Other

## 2019-06-14 ENCOUNTER — Encounter (HOSPITAL_COMMUNITY): Payer: Self-pay | Admitting: Physical Medicine & Rehabilitation

## 2019-06-14 DIAGNOSIS — I639 Cerebral infarction, unspecified: Secondary | ICD-10-CM | POA: Diagnosis not present

## 2019-06-14 DIAGNOSIS — I6310 Cerebral infarction due to embolism of unspecified precerebral artery: Secondary | ICD-10-CM | POA: Diagnosis not present

## 2019-06-14 DIAGNOSIS — I110 Hypertensive heart disease with heart failure: Secondary | ICD-10-CM | POA: Diagnosis not present

## 2019-06-14 DIAGNOSIS — I2510 Atherosclerotic heart disease of native coronary artery without angina pectoris: Secondary | ICD-10-CM | POA: Diagnosis present

## 2019-06-14 DIAGNOSIS — I69320 Aphasia following cerebral infarction: Secondary | ICD-10-CM | POA: Diagnosis not present

## 2019-06-14 DIAGNOSIS — I252 Old myocardial infarction: Secondary | ICD-10-CM | POA: Diagnosis not present

## 2019-06-14 DIAGNOSIS — L740 Miliaria rubra: Secondary | ICD-10-CM | POA: Diagnosis not present

## 2019-06-14 DIAGNOSIS — I513 Intracardiac thrombosis, not elsewhere classified: Secondary | ICD-10-CM

## 2019-06-14 DIAGNOSIS — H3530 Unspecified macular degeneration: Secondary | ICD-10-CM | POA: Diagnosis present

## 2019-06-14 DIAGNOSIS — I5043 Acute on chronic combined systolic (congestive) and diastolic (congestive) heart failure: Secondary | ICD-10-CM | POA: Diagnosis not present

## 2019-06-14 DIAGNOSIS — I63412 Cerebral infarction due to embolism of left middle cerebral artery: Secondary | ICD-10-CM

## 2019-06-14 DIAGNOSIS — Z85038 Personal history of other malignant neoplasm of large intestine: Secondary | ICD-10-CM

## 2019-06-14 DIAGNOSIS — I10 Essential (primary) hypertension: Secondary | ICD-10-CM | POA: Diagnosis present

## 2019-06-14 DIAGNOSIS — Z951 Presence of aortocoronary bypass graft: Secondary | ICD-10-CM

## 2019-06-14 DIAGNOSIS — I69351 Hemiplegia and hemiparesis following cerebral infarction affecting right dominant side: Secondary | ICD-10-CM | POA: Diagnosis not present

## 2019-06-14 DIAGNOSIS — N390 Urinary tract infection, site not specified: Secondary | ICD-10-CM | POA: Diagnosis not present

## 2019-06-14 DIAGNOSIS — Z79899 Other long term (current) drug therapy: Secondary | ICD-10-CM | POA: Diagnosis not present

## 2019-06-14 DIAGNOSIS — I629 Nontraumatic intracranial hemorrhage, unspecified: Secondary | ICD-10-CM | POA: Diagnosis not present

## 2019-06-14 DIAGNOSIS — H548 Legal blindness, as defined in USA: Secondary | ICD-10-CM | POA: Diagnosis present

## 2019-06-14 DIAGNOSIS — I5022 Chronic systolic (congestive) heart failure: Secondary | ICD-10-CM | POA: Diagnosis present

## 2019-06-14 DIAGNOSIS — I69391 Dysphagia following cerebral infarction: Secondary | ICD-10-CM

## 2019-06-14 DIAGNOSIS — D62 Acute posthemorrhagic anemia: Secondary | ICD-10-CM | POA: Diagnosis present

## 2019-06-14 DIAGNOSIS — G4700 Insomnia, unspecified: Secondary | ICD-10-CM | POA: Diagnosis present

## 2019-06-14 DIAGNOSIS — I255 Ischemic cardiomyopathy: Secondary | ICD-10-CM | POA: Diagnosis not present

## 2019-06-14 DIAGNOSIS — I6340 Cerebral infarction due to embolism of unspecified cerebral artery: Secondary | ICD-10-CM

## 2019-06-14 DIAGNOSIS — G3184 Mild cognitive impairment, so stated: Secondary | ICD-10-CM | POA: Diagnosis present

## 2019-06-14 DIAGNOSIS — R197 Diarrhea, unspecified: Secondary | ICD-10-CM | POA: Diagnosis not present

## 2019-06-14 DIAGNOSIS — E785 Hyperlipidemia, unspecified: Secondary | ICD-10-CM | POA: Diagnosis not present

## 2019-06-14 DIAGNOSIS — I5023 Acute on chronic systolic (congestive) heart failure: Secondary | ICD-10-CM

## 2019-06-14 DIAGNOSIS — I480 Paroxysmal atrial fibrillation: Secondary | ICD-10-CM

## 2019-06-14 DIAGNOSIS — Z7901 Long term (current) use of anticoagulants: Secondary | ICD-10-CM

## 2019-06-14 DIAGNOSIS — E871 Hypo-osmolality and hyponatremia: Secondary | ICD-10-CM | POA: Diagnosis not present

## 2019-06-14 DIAGNOSIS — E782 Mixed hyperlipidemia: Secondary | ICD-10-CM

## 2019-06-14 DIAGNOSIS — Z955 Presence of coronary angioplasty implant and graft: Secondary | ICD-10-CM | POA: Diagnosis not present

## 2019-06-14 DIAGNOSIS — Z95810 Presence of automatic (implantable) cardiac defibrillator: Secondary | ICD-10-CM | POA: Diagnosis not present

## 2019-06-14 DIAGNOSIS — K5900 Constipation, unspecified: Secondary | ICD-10-CM | POA: Diagnosis not present

## 2019-06-14 DIAGNOSIS — I25810 Atherosclerosis of coronary artery bypass graft(s) without angina pectoris: Secondary | ICD-10-CM

## 2019-06-14 DIAGNOSIS — Z8673 Personal history of transient ischemic attack (TIA), and cerebral infarction without residual deficits: Secondary | ICD-10-CM | POA: Diagnosis present

## 2019-06-14 DIAGNOSIS — K219 Gastro-esophageal reflux disease without esophagitis: Secondary | ICD-10-CM | POA: Diagnosis present

## 2019-06-14 DIAGNOSIS — R1313 Dysphagia, pharyngeal phase: Secondary | ICD-10-CM | POA: Diagnosis present

## 2019-06-14 DIAGNOSIS — E876 Hypokalemia: Secondary | ICD-10-CM | POA: Diagnosis present

## 2019-06-14 LAB — COMPREHENSIVE METABOLIC PANEL
ALT: 21 U/L (ref 0–44)
AST: 22 U/L (ref 15–41)
Albumin: 2.7 g/dL — ABNORMAL LOW (ref 3.5–5.0)
Alkaline Phosphatase: 37 U/L — ABNORMAL LOW (ref 38–126)
Anion gap: 10 (ref 5–15)
BUN: 17 mg/dL (ref 8–23)
CO2: 25 mmol/L (ref 22–32)
Calcium: 8.6 mg/dL — ABNORMAL LOW (ref 8.9–10.3)
Chloride: 103 mmol/L (ref 98–111)
Creatinine, Ser: 1.06 mg/dL (ref 0.61–1.24)
GFR calc Af Amer: >60 mL/min (ref >60)
GFR calc non Af Amer: >60 mL/min (ref >60)
Glucose, Bld: 126 mg/dL — ABNORMAL HIGH (ref 70–99)
Potassium: 4 mmol/L (ref 3.5–5.1)
Sodium: 138 mmol/L (ref 135–145)
Total Bilirubin: 2.1 mg/dL — ABNORMAL HIGH (ref 0.3–1.2)
Total Protein: 5.2 g/dL — ABNORMAL LOW (ref 6.5–8.1)

## 2019-06-14 LAB — MAGNESIUM: Magnesium: 2.2 mg/dL (ref 1.7–2.4)

## 2019-06-14 MED ORDER — POTASSIUM CHLORIDE CRYS ER 10 MEQ PO TBCR
10.0000 meq | EXTENDED_RELEASE_TABLET | Freq: Two times a day (BID) | ORAL | Status: DC
  Administered 2019-06-14 – 2019-07-08 (×48): 10 meq via ORAL
  Filled 2019-06-14 (×48): qty 1

## 2019-06-14 MED ORDER — PROCHLORPERAZINE MALEATE 5 MG PO TABS
5.0000 mg | ORAL_TABLET | Freq: Four times a day (QID) | ORAL | Status: DC | PRN
  Administered 2019-06-18: 10 mg via ORAL
  Filled 2019-06-14: qty 2

## 2019-06-14 MED ORDER — BENZONATATE 100 MG PO CAPS
200.0000 mg | ORAL_CAPSULE | Freq: Two times a day (BID) | ORAL | Status: DC | PRN
  Filled 2019-06-14: qty 2

## 2019-06-14 MED ORDER — OXYCODONE-ACETAMINOPHEN 5-325 MG PO TABS
1.0000 | ORAL_TABLET | Freq: Four times a day (QID) | ORAL | Status: DC | PRN
  Administered 2019-06-20 – 2019-07-05 (×4): 1 via ORAL
  Filled 2019-06-14 (×8): qty 1

## 2019-06-14 MED ORDER — ACETAMINOPHEN 325 MG PO TABS
325.0000 mg | ORAL_TABLET | ORAL | Status: DC | PRN
  Administered 2019-06-18 – 2019-07-07 (×17): 650 mg via ORAL
  Filled 2019-06-14 (×18): qty 2

## 2019-06-14 MED ORDER — PANTOPRAZOLE SODIUM 40 MG PO TBEC
40.0000 mg | DELAYED_RELEASE_TABLET | Freq: Every day | ORAL | Status: DC
  Administered 2019-06-15 – 2019-07-08 (×24): 40 mg via ORAL
  Filled 2019-06-14 (×24): qty 1

## 2019-06-14 MED ORDER — POLYETHYLENE GLYCOL 3350 17 G PO PACK: 17.0000 g | PACK | Freq: Every day | ORAL | 0 refills | Status: DC

## 2019-06-14 MED ORDER — BISACODYL 10 MG RE SUPP
10.0000 mg | Freq: Every day | RECTAL | Status: DC | PRN
  Administered 2019-07-01 – 2019-07-07 (×3): 10 mg via RECTAL
  Filled 2019-06-14 (×3): qty 1

## 2019-06-14 MED ORDER — FLEET ENEMA 7-19 GM/118ML RE ENEM: 1.0000 | ENEMA | Freq: Once | RECTAL | Status: DC | PRN

## 2019-06-14 MED ORDER — CHLORHEXIDINE GLUCONATE CLOTH 2 % EX PADS
6.0000 | MEDICATED_PAD | Freq: Every day | CUTANEOUS | Status: DC
  Administered 2019-06-16: 06:00:00 6 via TOPICAL

## 2019-06-14 MED ORDER — POLYETHYLENE GLYCOL 3350 17 G PO PACK
17.0000 g | PACK | Freq: Every day | ORAL | Status: DC | PRN
  Administered 2019-06-30: 17 g via ORAL

## 2019-06-14 MED ORDER — CARVEDILOL 12.5 MG PO TABS
12.5000 mg | ORAL_TABLET | Freq: Two times a day (BID) | ORAL | Status: DC
  Administered 2019-06-14 – 2019-07-08 (×48): 12.5 mg via ORAL
  Filled 2019-06-14 (×48): qty 1

## 2019-06-14 MED ORDER — HEPARIN SODIUM (PORCINE) 5000 UNIT/ML IJ SOLN
5000.0000 [IU] | Freq: Three times a day (TID) | INTRAMUSCULAR | Status: DC
  Administered 2019-06-14 – 2019-06-27 (×40): 5000 [IU] via SUBCUTANEOUS
  Filled 2019-06-14 (×40): qty 1

## 2019-06-14 MED ORDER — ATORVASTATIN CALCIUM 80 MG PO TABS
80.0000 mg | ORAL_TABLET | Freq: Every day | ORAL | Status: DC
  Administered 2019-06-14 – 2019-07-07 (×24): 80 mg via ORAL
  Filled 2019-06-14 (×24): qty 1

## 2019-06-14 MED ORDER — FUROSEMIDE 10 MG/ML IJ SOLN
40.0000 mg | Freq: Two times a day (BID) | INTRAMUSCULAR | Status: DC
  Administered 2019-06-14 – 2019-06-18 (×9): 40 mg via INTRAVENOUS
  Filled 2019-06-14 (×10): qty 4

## 2019-06-14 MED ORDER — ALUM & MAG HYDROXIDE-SIMETH 200-200-20 MG/5ML PO SUSP
30.0000 mL | ORAL | Status: DC | PRN
  Administered 2019-07-01: 30 mL via ORAL
  Filled 2019-06-14: qty 30

## 2019-06-14 MED ORDER — ISOSORBIDE MONONITRATE ER 30 MG PO TB24
30.0000 mg | ORAL_TABLET | Freq: Every day | ORAL | Status: DC
  Administered 2019-06-15 – 2019-07-08 (×24): 30 mg via ORAL
  Filled 2019-06-14 (×24): qty 1

## 2019-06-14 MED ORDER — PROCHLORPERAZINE 25 MG RE SUPP: 12.5000 mg | Freq: Four times a day (QID) | RECTAL | Status: DC | PRN

## 2019-06-14 MED ORDER — CHLORHEXIDINE GLUCONATE 0.12 % MT SOLN
15.0000 mL | Freq: Two times a day (BID) | OROMUCOSAL | Status: DC
  Administered 2019-06-14 – 2019-07-07 (×44): 15 mL via OROMUCOSAL
  Filled 2019-06-14 (×44): qty 15

## 2019-06-14 MED ORDER — POLYETHYLENE GLYCOL 3350 17 G PO PACK
17.0000 g | PACK | Freq: Every day | ORAL | Status: DC
  Administered 2019-06-16 – 2019-07-07 (×15): 17 g via ORAL
  Filled 2019-06-14 (×22): qty 1

## 2019-06-14 MED ORDER — DIPHENHYDRAMINE HCL 12.5 MG/5ML PO ELIX
12.5000 mg | ORAL_SOLUTION | Freq: Four times a day (QID) | ORAL | Status: DC | PRN
  Administered 2019-06-16: 12.5 mg via ORAL
  Administered 2019-06-16 – 2019-07-05 (×10): 25 mg via ORAL
  Filled 2019-06-14 (×13): qty 10

## 2019-06-14 MED ORDER — CARVEDILOL 12.5 MG PO TABS: 12.5000 mg | ORAL_TABLET | Freq: Two times a day (BID) | ORAL | 0 refills | Status: DC

## 2019-06-14 MED ORDER — TRAZODONE HCL 50 MG PO TABS
25.0000 mg | ORAL_TABLET | Freq: Every evening | ORAL | Status: DC | PRN
  Administered 2019-06-17: 25 mg via ORAL
  Filled 2019-06-14: qty 1

## 2019-06-14 MED ORDER — PROCHLORPERAZINE EDISYLATE 10 MG/2ML IJ SOLN: 5.0000 mg | Freq: Four times a day (QID) | INTRAMUSCULAR | Status: DC | PRN

## 2019-06-14 MED ORDER — GUAIFENESIN-DM 100-10 MG/5ML PO SYRP: 5.0000 mL | ORAL_SOLUTION | Freq: Four times a day (QID) | ORAL | Status: DC | PRN

## 2019-06-14 MED ORDER — EZETIMIBE 10 MG PO TABS
10.0000 mg | ORAL_TABLET | Freq: Every day | ORAL | Status: DC
  Administered 2019-06-14 – 2019-07-07 (×24): 10 mg via ORAL
  Filled 2019-06-14 (×24): qty 1

## 2019-06-14 MED ORDER — VITAMIN B-12 100 MCG PO TABS
100.0000 ug | ORAL_TABLET | Freq: Every day | ORAL | Status: DC
  Administered 2019-06-15 – 2019-07-08 (×24): 100 ug via ORAL
  Filled 2019-06-14 (×25): qty 1

## 2019-06-14 NOTE — Progress Notes (Signed)
Mark Ribas, MD  Physician  Physical Medicine and Rehabilitation  Consult Note     Signed  Date of Service:  06/08/2019  1:17 PM      Related encounter: ED to Hosp-Admission (Discharged) from 06/06/2019 in [facility]      Signed      Expand AllCollapse All            Physical Medicine and Rehabilitation Consult     Reason for Consult: Stroke with functional deficits.  Referring Physician: Dr. Wyline Copas     HPI: Mark Clayton is a 79 y.o. male with history of CAD, colon cancer, Macular degeneration--legally blind, chronic systolic CHF s/p AICD, PAF- on Xarelto who was admitted on 06/06/19 for work up after falling backwards in the yard, sticking his head and developing left sided weakness. CT head showed left hemispheric SDH and Xarelto reversed with Kcentra.  CTA head/neck/perfusion showed branch occlusion left M3 with corresponding acute infarct in left parietal lobe with surrounding penumbra. Neurology fel that L-MCA infarct embolic due to a fib and no antithrombotic due to hemorrhage. Dr. Annette Stable recommended conservative management with serial CT head.   Elevated BP treated with cleviprex.  2D echo showed global hypokinesis with EF < 20% and moderately elevated pulmonary pressures. Follow up CT head showed stable bleed with interval development of left frontoparietal infarct. Mentation improving and therapy evaluations completed today. Patient with resultant right hemiparesis with right inattention, expressive > receptive aphasia with dysarthria and visual deficits  affecting mobility and ADLs. CIR recommended due to functional decline.      Review of Systems  HENT: Negative for hearing loss.   Eyes: Positive for blurred vision (very poor vision left worse than right). Negative for double vision.  Respiratory: Positive for cough.   Cardiovascular: Positive for chest pain (some tightness).  Gastrointestinal: Negative for abdominal pain and heartburn.    Neurological: Positive for speech change and focal weakness.  Psychiatric/Behavioral: The patient has insomnia.           Past Medical History:  Diagnosis Date  . AICD (automatic cardioverter/defibrillator) present 01/17/2003    Medtronic Maximo 7232CX ICD, serial [id number]  . Anemia 02-06-11    takes oral iron  . Arthritis      hands, knees  . CAD (coronary artery disease) 2003    a. h/o MI and CABG in 2003. b. s/p DES to SVG-RPDA-RPLB in 08/2014.  Marland Kitchen Cancer of sigmoid colon ([place]) 2012    a. s/p colon surgery.  . Carotid bruit    . Chronic systolic CHF (congestive heart failure) (HCC)      a. EF 20% in 2014; b. 08/2017 Echo: EF 20-25%, diff HK, Gr3 DD. Triv AI. Mod MR. Sev dil LA. Mildly dil RV w/ mildly reduced RV fxn. Mildly dil RA. Mod TR. PASP 64mHg.  Marland Kitchen Cough    . GERD (gastroesophageal reflux disease) 02-06-11  . HTN (hypertension)    . Hyperlipidemia    . Ischemic cardiomyopathy      a. EF 20% in 2014. (Master study EF >20%); b. 08/2017 Echo: EF 20-25%, diff HK. Gr3 DD.  . LV (left ventricular) mural thrombus      a. 12/2012 Echo: EF 20% with mural thrombus No evidence of thrombus on 08/2017 echo.  . Macular degeneration    . Myocardial infarct ([place])      2003  . PAF (paroxysmal atrial fibrillation) (HCC)      a. CHA2DS2VASc = 5-->Xarelto/Tikosyn.  Past Surgical History:  Procedure Laterality Date  . CARDIAC CATHETERIZATION N/A 09/20/2014    Procedure: Left Heart Cath and Coronary Angiography;  Surgeon: Jettie Booze, MD; LAD 95%, D1 100%, CFX liner percent, OM 200%, OM 390%, RCA 90%, LIMA-LAD okay, SVG-OM 2-OM 3 minimal disease, SVG-RPDA-RPLB 100% between the RPDA and RPL     . CARDIAC CATHETERIZATION N/A 09/20/2014    Procedure: Coronary Stent Intervention;  Surgeon: Jettie Booze, MD; Synergy DES 4 x 24 mm reducing the stenosis to 5%   . CARDIAC DEFIBRILLATOR PLACEMENT   01/17/03    6949 lead. Medtronic. remote-no; with later revision  .  CARDIOVERSION N/A 05/22/2016    Procedure: CARDIOVERSION;  Surgeon: Lelon Perla, MD;  Location: [facility] ENDOSCOPY;  Service: Cardiovascular;  Laterality: N/A;  . CARDIOVERSION N/A 08/10/2017    Procedure: CARDIOVERSION;  Surgeon: Pixie Casino, MD;  Location: [facility] ENDOSCOPY;  Service: Cardiovascular;  Laterality: N/A;  . CATARACT EXTRACTION W/ INTRAOCULAR LENS  IMPLANT, BILATERAL Bilateral June/-July 2009    Dr. Katy Fitch  . COLON RESECTION   02/09/2011    Procedure: LAPAROSCOPIC SIGMOID COLON RESECTION;  Surgeon: Pedro Earls, MD;  Location: [facility] ORS;  Service: General;  Laterality: N/A;  Laparoscopic Assisted Sigmoid Colectomy  . COLON SURGERY      . COLONOSCOPY   08/31/2011    Procedure: COLONOSCOPY;  Surgeon: Jerene Bears, MD;  Location: [facility] ENDOSCOPY;  Service: Gastroenterology;  Laterality: N/A;  . COLONOSCOPY N/A 09/05/2012    Procedure: COLONOSCOPY;  Surgeon: Jerene Bears, MD;  Location: [facility] ENDOSCOPY;  Service: Gastroenterology;  Laterality: N/A;  . COLONOSCOPY N/A 04/18/2013    Procedure: COLONOSCOPY;  Surgeon: Jerene Bears, MD;  Location: [facility] ENDOSCOPY;  Service: Gastroenterology;  Laterality: N/A;  . COLONOSCOPY N/A 04/09/2014    Procedure: COLONOSCOPY;  Surgeon: Jerene Bears, MD;  Location: [facility] ENDOSCOPY;  Service: Gastroenterology;  Laterality: N/A;  . COLONOSCOPY WITH PROPOFOL N/A 05/03/2017    Procedure: COLONOSCOPY WITH PROPOFOL;  Surgeon: Jerene Bears, MD;  Location: [facility] ENDOSCOPY;  Service: Gastroenterology;  Laterality: N/A;  . CORONARY ANGIOPLASTY      . CORONARY ARTERY BYPASS GRAFT   01/2002    LIMA-LAD, SVG-OM 2-OM 3, SVG-RPDA-RPLB  . CORONARY STENT INTERVENTION N/A 11/22/2018    Procedure: CORONARY STENT INTERVENTION;  Surgeon: Nelva Bush, MD;  Location: [place] CV LAB;  Service: Cardiovascular;  Laterality: N/A;  SVG - RCA  . ICD GENERATOR CHANGE   2010    Medtronic Virtuoso II VR ICD  . ICD GENERATOR CHANGEOUT N/A 12/07/2018    Procedure: ICD GENERATOR CHANGEOUT;   Surgeon: Deboraha Sprang, MD;  Location: [place] [facility];  Service: Cardiovascular;  Laterality: N/A;  . INGUINAL HERNIA REPAIR Right 2000's X 2  . LAPAROSCOPIC RIGHT HEMI COLECTOMY N/A 11/04/2012    Procedure: LAPAROSCOPIC RIGHT HEMI COLECTOMY;  Surgeon: Pedro Earls, MD;  Location: [facility] ORS;  Service: General;  Laterality: N/A;  . LEFT HEART CATH AND CORS/GRAFTS ANGIOGRAPHY Left 11/22/2018    Procedure: LEFT HEART CATH AND CORS/GRAFTS ANGIOGRAPHY;  Surgeon: Nelva Bush, MD;  Location: [place] [facility];  Service: Cardiovascular;  Laterality: Left;  . TONSILLECTOMY   ~ 1950           Family History  Problem Relation Age of Onset  . Hypertension Father    . Hyperlipidemia Father    . Heart disease Father    . Prostate cancer Father    . Alzheimer's disease  Mother    . Hypertension Sister    . Hyperlipidemia Sister    . Colon cancer Neg Hx    . Esophageal cancer Neg Hx    . Rectal cancer Neg Hx    . Stomach cancer Neg Hx        Social History: Widowed-- Lives with grand daughter who is adopted.  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 Independent PTA--legally blind and family helps with home management.  Per  reports that he has never smoked. He has never used smokeless tobacco. He reports current alcohol use of about 2.0 standard drinks of alcohol per week. He reports that he does not use drugs.          Allergies  Allergen Reactions  . Latex Rash  . Tape Rash and Other (See Comments)      USE PAPER            Medications Prior to Admission  Medication Sig Dispense Refill  . atorvastatin (LIPITOR) 80 MG tablet Take 1 tablet (80 mg total) by mouth daily. 90 tablet 3  . carvedilol (COREG) 25 MG tablet Take 25 mg by mouth at bedtime.      . clopidogrel (PLAVIX) 75 MG tablet Take 1 tablet (75 mg total) by mouth daily with breakfast. 30 tablet 0  . ezetimibe (ZETIA) 10 MG tablet Take 1 tablet (10 mg total) by mouth daily. 90 tablet 3  . furosemide (LASIX) 40 MG  tablet TAKE 1 TABLET BY MOUTH  DAILY (Patient taking differently: Take 40 mg by mouth daily. ) 90 tablet 1  . isosorbide mononitrate (IMDUR) 30 MG 24 hr tablet TAKE 1 TABLET BY MOUTH  DAILY (Patient taking differently: Take 30 mg by mouth daily. ) 90 tablet 3  . losartan (COZAAR) 50 MG tablet TAKE 1 TABLET BY MOUTH  DAILY 90 tablet 1  . nitroGLYCERIN (NITROSTAT) 0.4 MG SL tablet DISSOLVE 1 TABLET UNDER THE TONGUE EVERY 5 MINUTES AS  NEEDED FOR CHEST PAIN. MAX  OF 3 TABLETS IN 15 MINUTES. CALL 911 IF PAIN PERSISTS. (Patient taking differently: Place 0.4 mg under the tongue every 5 (five) minutes as needed. ) 100 tablet 3  . pantoprazole (PROTONIX) 40 MG tablet TAKE 1 TABLET BY MOUTH  DAILY (Patient taking differently: Take 40 mg by mouth daily. ) 90 tablet 1  . pseudoephedrine-acetaminophen (TYLENOL SINUS) 30-500 MG TABS tablet Take 1 tablet by mouth every 4 (four) hours as needed.      . vitamin B-12 (CYANOCOBALAMIN) 100 MCG tablet Take 100 mcg by mouth daily.       . carvedilol (COREG) 6.25 MG tablet Take 1 tablet (6.25 mg total) by mouth 2 (two) times daily with a meal. (Patient not taking: Reported on 06/06/2019) 60 tablet 6  . dofetilide (TIKOSYN) 250 MCG capsule TAKE 1 CAPSULE BY MOUTH TWO TIMES DAILY (Patient not taking: Reported on 06/06/2019) 180 capsule 1  . meclizine (ANTIVERT) 25 MG tablet TAKE 1 TABLET BY MOUTH  DAILY AS NEEDED FOR  DIZZINESS. (Patient not taking: Reported on 06/06/2019) 90 tablet 0  . XARELTO 20 MG TABS tablet TAKE 1 TABLET BY MOUTH  DAILY WITH SUPPER (Patient not taking: Reported on 06/06/2019) 90 tablet 2      Home: [place] expects to be discharged to:: Private residence Living Arrangements: Other relatives Available Help at Discharge: Family Type of Home: House Home Access: Other (comment)(threshold) Home Layout: Able to live on main level with bedroom/bathroom Bathroom Shower/Tub: Tub/shower unit Additional Comments:  Granddaughter goes to college  T-Sa 10-2; states there are other family members who are offering to provide 24/7 i.e. sister lives down street, ex wife, daughters  Lives With: Spouse  Functional History: Prior Function Level of Independence: Independent Comments: 80% blind, does not drive. Manages own medications with intermittent assist from granddaughter for set up, cooks, cleans. Enjoys wood working, Control and instrumentation engineer.   Functional Status:  Mobility: Bed Mobility Overal bed mobility: Needs Assistance Bed Mobility: Supine to Sit, Sit to Supine Supine to sit: Max assist, +2 for physical assistance Sit to supine: Max assist, +2 for physical assistance General bed mobility comments: MaxA + 2 for supine <> sit. Pt able to progress LLE to edge of bed and hold onto bed rail with LUE. Minimal push off and requiring assist for trunk elevation, RLE negotiation, and use of bed pad to pull hips anteriorly. Trunk and leg guidance required for return to supine Transfers Overall transfer level: Needs assistance Equipment used: None Transfers: Sit to/from Stand Sit to Stand: +2 physical assistance, Mod assist, Max assist General transfer comment: ModA + 2 to stand from edge of bed with right knee blocked, pt with good initiation and activation. R knee buckle after ~5 seconds requiring maxA to maintain standing.    ADL: ADL Overall ADL's : Needs assistance/impaired Eating/Feeding: Minimal assistance Grooming: Wash/dry hands, Wash/dry face, Oral care, Sitting, Set up Upper Body Bathing: Moderate assistance, Sitting Lower Body Bathing: Total assistance, Maximal assistance, Bed level Upper Body Dressing : Minimal assistance Lower Body Dressing: Total assistance   Cognition: Cognition Overall Cognitive Status: Difficult to assess Orientation Level: Oriented X4 Cognition Arousal/Alertness: Awake/alert Behavior During Therapy: WFL for tasks assessed/performed Overall Cognitive Status: Difficult to assess Area of Impairment: Following  commands, Memory, Attention, Safety/judgement, Awareness Current Attention Level: Focused, Sustained Memory: Decreased short-term memory Following Commands: Follows one step commands inconsistently Safety/Judgement: Decreased awareness of safety, Decreased awareness of deficits Awareness: Intellectual General Comments: Pt with expressive > receptive aphasia. Difficult to assess orientation; pt able to recall date, stating "yes," when asked if he was in a hospital. Recalled traumatic incident but not imaging findings i.e. that he had a SDH/SAH and its functional impact. Pt tending to give verbose, tangential answers when asked a question, most of which was unintelligible. Did not know how to use call bell; able to recall with min cues after period of time.  Difficult to assess due to: Impaired communication     Blood pressure (!) 138/105, pulse 81, temperature 98.1 F (36.7 C), temperature source Oral, resp. rate 18, weight 77.9 kg, SpO2 97 %.   Physical Exam  Nursing note and vitals reviewed. Constitutional: He appears well-developed and well-nourished.  HEENT: Head is normocephalic, atraumatic, PERRLA, EOMI, sclera anicteric, oral mucosa pink and moist, dentition intact, ext ear canals clear,  Neck: Supple without JVD or lymphadenopathy Heart: An irregularly irregular rhythm present. No murmurs rubs or gallops Chest: CTA bilaterally without wheezes, rales, or rhonchi; no distress Abdomen: Soft, non-tender, non-distended, bowel sounds positive. Extremities: No clubbing, cyanosis, or edema. Pulses are 2+ Skin: Clean and intact without signs of breakdown Neuro: Alert, mild confusion. Right facial weakness with dysarthria. Expressive deficits with occasional jargon. Right hemiparesis. 0/5 throughout right side with exception of slight movement in right foot.  Psych: Pt's affect is appropriate. Pt is cooperative   Lab Results Last 24 Hours       Results for orders placed or performed during  the hospital encounter of 06/06/19 (from the past 24 hour(s))  Urine rapid drug screen (hosp performed)     Status: None    Collection Time: 06/07/19  2:00 PM  Result Value Ref Range    Opiates NONE DETECTED NONE DETECTED    Cocaine NONE DETECTED NONE DETECTED    Benzodiazepines NONE DETECTED NONE DETECTED    Amphetamines NONE DETECTED NONE DETECTED    Tetrahydrocannabinol NONE DETECTED NONE DETECTED    Barbiturates NONE DETECTED NONE DETECTED  Urinalysis, Routine w reflex microscopic     Status: Abnormal    Collection Time: 06/07/19  2:00 PM  Result Value Ref Range    Color, Urine YELLOW YELLOW    APPearance CLEAR CLEAR    Specific Gravity, Urine 1.027 1.005 - 1.030    pH 6.0 5.0 - 8.0    Glucose, UA NEGATIVE NEGATIVE mg/dL    Hgb urine dipstick MODERATE (A) NEGATIVE    Bilirubin Urine NEGATIVE NEGATIVE    Ketones, ur 5 (A) NEGATIVE mg/dL    Protein, ur 30 (A) NEGATIVE mg/dL    Nitrite NEGATIVE NEGATIVE    Leukocytes,Ua NEGATIVE NEGATIVE    RBC / HPF 0-5 0 - 5 RBC/hpf    WBC, UA 0-5 0 - 5 WBC/hpf    Bacteria, UA NONE SEEN NONE SEEN    Squamous Epithelial / LPF 0-5 0 - 5  CBC     Status: Abnormal    Collection Time: 06/08/19  5:09 AM  Result Value Ref Range    WBC 8.3 4.0 - 10.5 K/uL    RBC 4.14 (L) 4.22 - 5.81 MIL/uL    Hemoglobin 12.0 (L) 13.0 - 17.0 g/dL    HCT 36.6 (L) 39.0 - 52.0 %    MCV 88.4 80.0 - 100.0 fL    MCH 29.0 26.0 - 34.0 pg    MCHC 32.8 30.0 - 36.0 g/dL    RDW 14.8 11.5 - 15.5 %    Platelets 163 150 - 400 K/uL    nRBC 0.0 0.0 - 0.2 %  Basic metabolic panel     Status: Abnormal    Collection Time: 06/08/19  5:09 AM  Result Value Ref Range    Sodium 141 135 - 145 mmol/L    Potassium 3.4 (L) 3.5 - 5.1 mmol/L    Chloride 108 98 - 111 mmol/L    CO2 23 22 - 32 mmol/L    Glucose, Bld 134 (H) 70 - 99 mg/dL    BUN 14 8 - 23 mg/dL    Creatinine, Ser 1.20 0.61 - 1.24 mg/dL    Calcium 8.9 8.9 - 10.3 mg/dL    GFR calc non Af Amer 58 (L) >60 mL/min    GFR calc  Af Amer >60 >60 mL/min    Anion gap 10 5 - 15  Magnesium     Status: None    Collection Time: 06/08/19  5:09 AM  Result Value Ref Range    Magnesium 2.2 1.7 - 2.4 mg/dL  Phosphorus     Status: None    Collection Time: 06/08/19  5:09 AM  Result Value Ref Range    Phosphorus 3.0 2.5 - 4.6 mg/dL       Imaging Results (Last 48 hours)  CT Code Stroke CTA Head W/WO contrast   Result Date: 06/06/2019 CLINICAL DATA:  Stroke EXAM: CT PERFUSION BRAIN TECHNIQUE: Multiphase CT imaging of the brain was performed following IV bolus contrast injection. Subsequent parametric perfusion maps were calculated using RAPID software. CONTRAST:  145mL OMNIPAQUE IOHEXOL 350 MG/ML SOLN COMPARISON:  CTA head 06/06/2019 FINDINGS: CT Brain Perfusion Findings: CBF (<30%) Volume: 58mL Perfusion (Tmax>6.0s) volume: 78mL Mismatch Volume: 73mL ASPECTS on noncontrast CT Head: Aspect score not calculated due to intracranial hemorrhage. Infarct Core: 9 mL Infarction Location:Left parietal lobe IMPRESSION: 9 mL of acute infarction left parietal lobe with 81 mL of surrounding penumbra. Electronically Signed   By: Franchot Gallo M.D.   On: 06/06/2019 19:10    CT HEAD WO CONTRAST   Result Date: 06/07/2019 CLINICAL DATA:  Stroke.  Follow-up subdural hemorrhage EXAM: CT HEAD WITHOUT CONTRAST TECHNIQUE: Contiguous axial images were obtained from the base of the skull through the vertex without intravenous contrast. COMPARISON:  CTA head and CT perfusion 06/06/2019 FINDINGS: Brain: High-density acute hemorrhage over the left posterior frontal convexity is stable. This is a combination of subdural blood along the falx and subarachnoid hemorrhage over the convexity. No new hemorrhage. Ventricle size normal. Interval development of hypodensity in the left posterior lateral frontal lobe compatible with acute infarct. Additional area of hypodensity has developed in the left medial frontal parietal lobe compatible with acute infarct. Vascular:  Negative for hyperdense vessel Skull: Negative Sinuses/Orbits: Contracted right maxillary sinus. Remaining sinuses clear. Bilateral cataract extraction. Other: None IMPRESSION: Para falcine subdural hematoma unchanged. Subarachnoid and subdural hemorrhage over the left convexity unchanged. Interval development of acute infarct in the left frontal parietal lobe. This involves the medial portion of the motor cortex. There may also be involvement of the lateral portion of the motor cortex on the left. Electronically Signed   By: Franchot Gallo M.D.   On: 06/07/2019 11:32    CT Code Stroke CTA Neck W/WO contrast   Result Date: 06/06/2019 CLINICAL DATA:  Code stroke. Fell and hit head. Intracranial hemorrhage. Slurred speech and right-sided weakness. EXAM: CT ANGIOGRAPHY HEAD AND NECK CT PERFUSION BRAIN TECHNIQUE: Multidetector CT imaging of the head and neck was performed using the standard protocol during bolus administration of intravenous contrast. Multiplanar CT image reconstructions and MIPs were obtained to evaluate the vascular anatomy. Carotid stenosis measurements (when applicable) are obtained utilizing NASCET criteria, using the distal internal carotid diameter as the denominator. Multiphase CT imaging of the brain was performed following IV bolus contrast injection. Subsequent parametric perfusion maps were calculated using RAPID software. CONTRAST:  100 mL Omnipaque 350 IV COMPARISON:  CT head approximately 2 hours prior FINDINGS: CT HEAD FINDINGS Brain: Acute extra-axial hemorrhage left posterior frontal convexity appears unchanged. This is a combination of subdural para falcine blood as well as adjacent subarachnoid hemorrhage. The largest area of hemorrhage measures approximately 25 x 15 mm. Ventricle size normal. No acute ischemic infarct. Negative for hemorrhage or mass. Vascular: Negative for hyperdense vessel. Arterial calcification diffusely Skull: Negative for skull fracture Sinuses/Orbits:  Mucosal edema paranasal sinuses. Contracted right maxillary sinus. Bilateral cataract surgery.  No orbital mass. Other: None ASPECTS ([place] Stroke Program Early CT Score) Not applicable due to intracranial hemorrhage. CTA NECK FINDINGS Aortic arch: Standard branching. Imaged portion shows no evidence of aneurysm or dissection. No significant stenosis of the major arch vessel origins. Atherosclerotic disease in the aortic arch and proximal great vessels. Pacemaker noted. Right carotid system: Atherosclerotic disease right carotid bifurcation. Approximately 50% diameter stenosis proximal right internal carotid artery. Mild atherosclerotic disease right common carotid artery. Left carotid system: Atherosclerotic disease left carotid bifurcation without significant stenosis. Vertebral arteries: Both vertebral arteries patent to the basilar without significant stenosis. Skeleton: Degenerative changes cervical spine. No acute skeletal abnormality. Other neck: Negative Upper chest: Lung apices  clear bilaterally.  Left-sided pacemaker. Review of the MIP images confirms the above findings CTA HEAD FINDINGS Anterior circulation: Atherosclerotic calcification in the cavernous carotid bilaterally causing mild stenosis. Right middle cerebral artery widely patent. Both anterior cerebral arteries widely patent. Left M1 widely patent. There is a branch occlusion left M3. There is flow past the short segment occlusion with decreased perfusion in this area. Probable additional distal emboli past the occlusion. This corresponds to an area of ischemia on CT perfusion. Posterior circulation: Mild atherosclerotic stenosis of the distal vertebral artery bilaterally. Right PICA patent. Left PICA not visualized. AICA patent bilaterally. Mild atherosclerotic disease in the basilar. Superior cerebellar and posterior cerebral arteries patent bilaterally. Fetal origin right posterior cerebral artery. Venous sinuses: Patent Anatomic variants:  None Review of the MIP images confirms the above findings CT Brain Perfusion Findings: ASPECTS: Not scored due to intracranial hemorrhage CBF (<30%) Volume: 33mL Perfusion (Tmax>6.0s) volume: 62mL Mismatch Volume: 65mL Infarction Location:Left parietal lobe IMPRESSION: 1. Left convexity hemorrhage unchanged from earlier today. This appears to be a combination of left parafalcine subdural hematoma and subarachnoid hemorrhage. This is related to trauma given history of fall. No acute ischemic infarct on CT. 2. 50% diameter stenosis proximal right internal carotid artery. Left carotid artery patent without significant stenosis. Both vertebral arteries patent in the neck. 3. Segmental occlusion left M3 branch with trickle distal flow and additional clot distally. 4. CT perfusion demonstrates 9 mL of core infarct and 81 mL of additional ischemia in the left parietal lobe corresponding to the left M3 branch occlusion. 5. These results were called by telephone at the time of interpretation on 06/06/2019 at 6:44 pm to provider ERIC [facility] , who verbally acknowledged these results. Electronically Signed   By: Franchot Gallo M.D.   On: 06/06/2019 18:45    CT Code Stroke Cerebral Perfusion with contrast   Result Date: 06/06/2019 CLINICAL DATA:  Stroke EXAM: CT PERFUSION BRAIN TECHNIQUE: Multiphase CT imaging of the brain was performed following IV bolus contrast injection. Subsequent parametric perfusion maps were calculated using RAPID software. CONTRAST:  168mL OMNIPAQUE IOHEXOL 350 MG/ML SOLN COMPARISON:  CTA head 06/06/2019 FINDINGS: CT Brain Perfusion Findings: CBF (<30%) Volume: 20mL Perfusion (Tmax>6.0s) volume: 15mL Mismatch Volume: 10mL ASPECTS on noncontrast CT Head: Aspect score not calculated due to intracranial hemorrhage. Infarct Core: 9 mL Infarction Location:Left parietal lobe IMPRESSION: 9 mL of acute infarction left parietal lobe with 81 mL of surrounding penumbra. Electronically Signed   By: Franchot Gallo  M.D.   On: 06/06/2019 19:10    DG Chest Port 1 View   Result Date: 06/07/2019 CLINICAL DATA:  CVA. EXAM: PORTABLE CHEST 1 VIEW COMPARISON:  06/02/2019 FINDINGS: The pacer wires/AICD are stable. Stable mild cardiac enlargement. Diffuse slightly nodular interstitial process in the lungs could suggest interstitial edema or interstitial pneumonitis. No focal infiltrates or effusions. IMPRESSION: Diffuse slightly nodular interstitial process in the lungs could suggest interstitial edema or interstitial pneumonitis. Electronically Signed   By: Marijo Sanes M.D.   On: 06/07/2019 08:32    DG Swallowing Func-Speech Pathology   Result Date: 06/07/2019 Objective Swallowing Evaluation: Type of Study: MBS-Modified Barium Swallow Study  Patient Details Name: DICK ANGELLO MRN: [id number] Date of Birth: 10/31/1940 Today's Date: 06/07/2019 Time: SLP Start Time (ACUTE ONLY): 1200 -SLP Stop Time (ACUTE ONLY): 1230 SLP Time Calculation (min) (ACUTE ONLY): 30 min Past Medical History: Past Medical History: Diagnosis Date . AICD (automatic cardioverter/defibrillator) present 01/17/2003  Medtronic Maximo 7232CX ICD, serial [id number] .  Anemia 02-06-11  takes oral iron . Arthritis   hands, knees . CAD (coronary artery disease) 2003  a. h/o MI and CABG in 2003. b. s/p DES to SVG-RPDA-RPLB in 08/2014. Marland Kitchen Cancer of sigmoid colon ([place]) 2012  a. s/p colon surgery. . Carotid bruit  . Chronic systolic CHF (congestive heart failure) (HCC)   a. EF 20% in 2014; b. 08/2017 Echo: EF 20-25%, diff HK, Gr3 DD. Triv AI. Mod MR. Sev dil LA. Mildly dil RV w/ mildly reduced RV fxn. Mildly dil RA. Mod TR. PASP 3mHg. Marland Kitchen Cough  . GERD (gastroesophageal reflux disease) 02-06-11 . HTN (hypertension)  . Hyperlipidemia  . Ischemic cardiomyopathy   a. EF 20% in 2014. (Master study EF >20%); b. 08/2017 Echo: EF 20-25%, diff HK. Gr3 DD. . LV (left ventricular) mural thrombus   a. 12/2012 Echo: EF 20% with mural thrombus No evidence of thrombus on 08/2017 echo. .  Macular degeneration  . Myocardial infarct ([place])   2003 . PAF (paroxysmal atrial fibrillation) (HCC)   a. CHA2DS2VASc = 5-->Xarelto/Tikosyn. Past Surgical History: Past Surgical History: Procedure Laterality Date . CARDIAC CATHETERIZATION N/A 09/20/2014  Procedure: Left Heart Cath and Coronary Angiography;  Surgeon: Jettie Booze, MD; LAD 95%, D1 100%, CFX liner percent, OM 200%, OM 390%, RCA 90%, LIMA-LAD okay, SVG-OM 2-OM 3 minimal disease, SVG-RPDA-RPLB 100% between the RPDA and RPL    . CARDIAC CATHETERIZATION N/A 09/20/2014  Procedure: Coronary Stent Intervention;  Surgeon: Jettie Booze, MD; Synergy DES 4 x 24 mm reducing the stenosis to 5%  . CARDIAC DEFIBRILLATOR PLACEMENT  01/17/03  6949 lead. Medtronic. remote-no; with later revision . CARDIOVERSION N/A 05/22/2016  Procedure: CARDIOVERSION;  Surgeon: Lelon Perla, MD;  Location: [facility] ENDOSCOPY;  Service: Cardiovascular;  Laterality: N/A; . CARDIOVERSION N/A 08/10/2017  Procedure: CARDIOVERSION;  Surgeon: Pixie Casino, MD;  Location: [facility] ENDOSCOPY;  Service: Cardiovascular;  Laterality: N/A; . CATARACT EXTRACTION W/ INTRAOCULAR LENS  IMPLANT, BILATERAL Bilateral June/-July 2009  Dr. Katy Fitch . COLON RESECTION  02/09/2011  Procedure: LAPAROSCOPIC SIGMOID COLON RESECTION;  Surgeon: Pedro Earls, MD;  Location: [facility] ORS;  Service: General;  Laterality: N/A;  Laparoscopic Assisted Sigmoid Colectomy . COLON SURGERY   . COLONOSCOPY  08/31/2011  Procedure: COLONOSCOPY;  Surgeon: Jerene Bears, MD;  Location: [facility] ENDOSCOPY;  Service: Gastroenterology;  Laterality: N/A; . COLONOSCOPY N/A 09/05/2012  Procedure: COLONOSCOPY;  Surgeon: Jerene Bears, MD;  Location: [facility] ENDOSCOPY;  Service: Gastroenterology;  Laterality: N/A; . COLONOSCOPY N/A 04/18/2013  Procedure: COLONOSCOPY;  Surgeon: Jerene Bears, MD;  Location: [facility] ENDOSCOPY;  Service: Gastroenterology;  Laterality: N/A; . COLONOSCOPY N/A 04/09/2014  Procedure: COLONOSCOPY;  Surgeon: Jerene Bears, MD;  Location:  [facility] ENDOSCOPY;  Service: Gastroenterology;  Laterality: N/A; . COLONOSCOPY WITH PROPOFOL N/A 05/03/2017  Procedure: COLONOSCOPY WITH PROPOFOL;  Surgeon: Jerene Bears, MD;  Location: [facility] ENDOSCOPY;  Service: Gastroenterology;  Laterality: N/A; . CORONARY ANGIOPLASTY   . CORONARY ARTERY BYPASS GRAFT  01/2002  LIMA-LAD, SVG-OM 2-OM 3, SVG-RPDA-RPLB . CORONARY STENT INTERVENTION N/A 11/22/2018  Procedure: CORONARY STENT INTERVENTION;  Surgeon: Nelva Bush, MD;  Location: [place] CV LAB;  Service: Cardiovascular;  Laterality: N/A;  SVG - RCA . ICD GENERATOR CHANGE  2010  Medtronic Virtuoso II VR ICD . ICD GENERATOR CHANGEOUT N/A 12/07/2018  Procedure: ICD GENERATOR CHANGEOUT;  Surgeon: Deboraha Sprang, MD;  Location: [place] [facility] LAB;  Service: Cardiovascular;  Laterality: N/A; . INGUINAL HERNIA REPAIR Right 2000's X 2 . LAPAROSCOPIC  RIGHT HEMI COLECTOMY N/A 11/04/2012  Procedure: LAPAROSCOPIC RIGHT HEMI COLECTOMY;  Surgeon: Pedro Earls, MD;  Location: [facility] ORS;  Service: General;  Laterality: N/A; . LEFT HEART CATH AND CORS/GRAFTS ANGIOGRAPHY Left 11/22/2018  Procedure: LEFT HEART CATH AND CORS/GRAFTS ANGIOGRAPHY;  Surgeon: Nelva Bush, MD;  Location: [place] [facility] LAB;  Service: Cardiovascular;  Laterality: Left; . TONSILLECTOMY  ~ 1950 HPI: DAGIM TOUSIGNANT is a 79 y.o. male who was admitted 3/9 with traumatic ICH. PMH including but not limited to PAF on xarelto, MI, LV thrombus, ICM, HTN, HLD, CAD, CA of sigmoid colon s/p surgery (see "past medical history" for rest). CT (3/9) revealing a combination of left parafalcine subdural hematoma and subarachnoid hemorrhage. This is related to trauma given history of fall. No acute ischemic infarct on CT. Presents with Expressive > receptive apahsia. H/o GERD ad coughing.  Subjective: cooperative Assessment / Plan / Recommendation CHL IP CLINICAL IMPRESSIONS 06/07/2019 Clinical Impression Pt presents with oropharyngeal dysphagia across consistencies. Oral phase is  remarkable for lingual residue. Pharyngeal phase is remarkable for reduced BOT retraction and epiglottic inversion resulting in vallecular and pyriform sinsus residue. Vallecular residue significantly increased with progression of advanced textures, and multiple strategies were attempted to reduce the amount, with the effortful swallow being the most effective. Trace penetration (PAS 3) occured x2 as the pt also utilized a liquid wash to reduce vallecular residue following advanced textures, however, it was not observed following any other textures. There was frequent coughing and throat clearing as seen at bedside, but was not related to aspiration. Given the penetration and amount of residue with advanced textures, recommend Dys 2 and thin liquids, utilizing liquid wash PRN, multiple hard swallows following each bite/sip, clearing throat intermittently. SLP Visit Diagnosis Dysphagia, oropharyngeal phase (R13.12) Attention and concentration deficit following -- Frontal lobe and executive function deficit following -- Impact on safety and function --   CHL IP TREATMENT RECOMMENDATION 06/07/2019 Treatment Recommendations Therapy as outlined in treatment plan below   Prognosis 06/07/2019 Prognosis for Safe Diet Advancement Good Barriers to Reach Goals -- Barriers/Prognosis Comment -- CHL IP DIET RECOMMENDATION 06/07/2019 SLP Diet Recommendations Dysphagia 2 (Fine chop) solids;Thin liquid Liquid Administration via Cup;Straw;Spoon Medication Administration Crushed with puree Compensations Minimize environmental distractions;Slow rate;Small sips/bites;Multiple dry swallows after each bite/sip;Follow solids with liquid;Effortful swallow Postural Changes Seated upright at 90 degrees;Remain semi-upright after after feeds/meals (Comment)   CHL IP OTHER RECOMMENDATIONS 06/07/2019 Recommended Consults -- Oral Care Recommendations Oral care BID Other Recommendations --   CHL IP FOLLOW UP RECOMMENDATIONS 06/07/2019 Follow up  Recommendations Other (comment)   CHL IP FREQUENCY AND DURATION 06/07/2019 Speech Therapy Frequency (ACUTE ONLY) min 2x/week Treatment Duration 2 weeks      CHL IP ORAL PHASE 06/07/2019 Oral Phase Impaired Oral - Pudding Teaspoon -- Oral - Pudding Cup -- Oral - Honey Teaspoon -- Oral - Honey Cup -- Oral - Nectar Teaspoon -- Oral - Nectar Cup Lingual/palatal residue Oral - Nectar Straw -- Oral - Thin Teaspoon -- Oral - Thin Cup WFL Oral - Thin Straw WFL Oral - Puree WFL Oral - Mech Soft -- Oral - Regular Lingual/palatal residue Oral - Multi-Consistency -- Oral - Pill -- Oral Phase - Comment --  CHL IP PHARYNGEAL PHASE 06/07/2019 Pharyngeal Phase Impaired Pharyngeal- Pudding Teaspoon -- Pharyngeal -- Pharyngeal- Pudding Cup -- Pharyngeal -- Pharyngeal- Honey Teaspoon -- Pharyngeal -- Pharyngeal- Honey Cup -- Pharyngeal -- Pharyngeal- Nectar Teaspoon -- Pharyngeal -- Pharyngeal- Nectar Cup Pharyngeal residue - valleculae;Pharyngeal residue -  pyriform;Reduced tongue base retraction;Compensatory strategies attempted (with notebox) Pharyngeal -- Pharyngeal- Nectar Straw -- Pharyngeal -- Pharyngeal- Thin Teaspoon -- Pharyngeal -- Pharyngeal- Thin Cup Pharyngeal residue - valleculae;Pharyngeal residue - pyriform;Reduced tongue base retraction;Penetration/Aspiration during swallow Pharyngeal Material enters airway, remains ABOVE vocal cords then ejected out Pharyngeal- Thin Straw Pharyngeal residue - valleculae;Pharyngeal residue - pyriform;Reduced tongue base retraction Pharyngeal -- Pharyngeal- Puree Pharyngeal residue - pyriform;Pharyngeal residue - valleculae;Reduced tongue base retraction Pharyngeal -- Pharyngeal- Mechanical Soft -- Pharyngeal -- Pharyngeal- Regular Pharyngeal residue - valleculae;Pharyngeal residue - pyriform;Reduced tongue base retraction Pharyngeal -- Pharyngeal- Multi-consistency -- Pharyngeal -- Pharyngeal- Pill -- Pharyngeal -- Pharyngeal Comment --  CHL IP CERVICAL ESOPHAGEAL PHASE 06/07/2019  Cervical Esophageal Phase WFL Pudding Teaspoon -- Pudding Cup -- Honey Teaspoon -- Honey Cup -- Nectar Teaspoon -- Nectar Cup -- Nectar Straw -- Thin Teaspoon -- Thin Cup -- Thin Straw -- Puree -- Mechanical Soft -- Regular -- Multi-consistency -- Pill -- Cervical Esophageal Comment -- Osie Bond., M.A. [place] Pager [phone] Office [phone] 06/07/2019, 2:23 PM               ECHOCARDIOGRAM COMPLETE   Result Date: 06/07/2019    ECHOCARDIOGRAM REPORT   Patient Name:   AUDIEL MAYLOR Date of Exam: 06/07/2019 Medical Rec #:  [id number]      Height:       72.0 in Accession #:    [id number]     Weight:       159.8 lb Date of Birth:  [birth year]      BSA:          1.937 m Patient Age:    79 years       BP:           139/85 mmHg Patient Gender: M              HR:           91 bpm. Exam Location:  Inpatient Procedure: 2D Echo and Intracardiac Opacification Agent Indications:    Stroke 434.91 / I163.9  History:        Patient has prior history of Echocardiogram examinations, most                 recent 09/03/2017. CAD and Previous Myocardial Infarction,                 Defibrillator, Arrythmias:Atrial Fibrillation,                 Signs/Symptoms:Chest Pain; Risk Factors:Hypertension. Ischemic                 cardiomyopathy.  Sonographer:    Darlina Sicilian RDCS Referring Phys: [id number] [place]  1. No mural thrombus with Definity contrast. Left ventricular ejection fraction, by estimation, is <20%. The left ventricle has severely decreased function. The left ventricle demonstrates global hypokinesis with regional variation. The left ventricular  internal cavity size was moderately to severely dilated.  2. Right ventricular systolic function is moderately reduced. The right ventricular size is mildly enlarged. A AICD wire is visualized. There is moderately elevated pulmonary artery systolic pressure.  3. Left atrial size was moderately dilated.  4. The mitral valve is abnormal.  Mild mitral valve regurgitation.  5. The aortic valve is tricuspid. Aortic valve regurgitation is mild. Mild aortic valve sclerosis is present, with no evidence of aortic valve stenosis.  6. The inferior vena cava is dilated in size with <50% respiratory variability, suggesting right atrial  pressure of 15 mmHg. Comparison(s): Changes from prior study are noted. 09/03/2017: LVEF 20-25%. FINDINGS  Left Ventricle: No mural thrombus with Definity contrast. Left ventricular ejection fraction, by estimation, is <20%. The left ventricle has severely decreased function. The left ventricle demonstrates global hypokinesis. Definity contrast agent was given IV to delineate the left ventricular endocardial borders. The left ventricular internal cavity size was moderately to severely dilated. There is no left ventricular hypertrophy. Left ventricular diastolic parameters are indeterminate. Right Ventricle: The right ventricular size is mildly enlarged. No increase in right ventricular wall thickness. Right ventricular systolic function is moderately reduced. There is moderately elevated pulmonary artery systolic pressure. The tricuspid regurgitant velocity is 2.82 m/s, and with an assumed right atrial pressure of 15 mmHg, the estimated right ventricular systolic pressure is [id number]. Left Atrium: Left atrial size was moderately dilated. Right Atrium: Right atrial size was normal in size. Pericardium: There is no evidence of pericardial effusion. Mitral Valve: The mitral valve is abnormal. There is mild thickening of the mitral valve leaflet(s). Mild mitral valve regurgitation. Tricuspid Valve: The tricuspid valve is grossly normal. Tricuspid valve regurgitation is mild. Aortic Valve: The aortic valve is tricuspid. Aortic valve regurgitation is mild. Mild aortic valve sclerosis is present, with no evidence of aortic valve stenosis. Pulmonic Valve: The pulmonic valve was grossly normal. Pulmonic valve regurgitation is trivial.  Aorta: The aortic root, ascending aorta, aortic arch and descending aorta are all structurally normal, with no evidence of dilitation or obstruction. Venous: The inferior vena cava is dilated in size with less than 50% respiratory variability, suggesting right atrial pressure of 15 mmHg. IAS/Shunts: No atrial level shunt detected by color flow Doppler. Additional Comments: A AICD wire is visualized.  LEFT VENTRICLE PLAX 2D LVIDd:         6.93 cm LVIDs:         6.15 cm LV PW:         1.01 cm LV IVS:        1.06 cm LVOT diam:     1.90 cm LV SV:         32 LV SV Index:   17 LVOT Area:     2.84 cm  LV Volumes (MOD) LV vol d, MOD A2C: 268.0 ml LV vol d, MOD A4C: 307.5 ml LV vol s, MOD A2C: 210.0 ml LV vol s, MOD A4C: 223.0 ml LV SV MOD A2C:     58.0 ml LV SV MOD A4C:     307.5 ml LV SV MOD BP:      67.7 ml RIGHT VENTRICLE RV S prime:     7.67 cm/s LEFT ATRIUM             Index       RIGHT ATRIUM           Index LA diam:        5.30 cm 2.74 cm/m  RA Area:     24.40 cm LA Vol (A2C):   65.4 ml 33.76 ml/m RA Volume:   77.10 ml  39.81 ml/m LA Vol (A4C):   84.8 ml 43.78 ml/m LA Biplane Vol: 77.2 ml 39.86 ml/m  AORTIC VALVE LVOT Vmax:   83.30 cm/s LVOT Vmean:  48.200 cm/s LVOT VTI:    0.113 m  AORTA Ao Root diam: 3.40 cm Ao Asc diam:  3.50 cm MITRAL VALVE                TRICUSPID VALVE MV Area (PHT): 5.68  cm     TR Peak grad:   31.8 mmHg MV Decel Time: 134 msec     TR Vmax:        282.00 cm/s MV E velocity: 125.50 cm/s                             SHUNTS                             Systemic VTI:  0.11 m                             Systemic Diam: 1.90 cm Lyman Bishop MD Electronically signed by Lyman Bishop MD Signature Date/Time: 06/07/2019/4:27:13 PM    Final     CT HEAD CODE STROKE WO CONTRAST   Result Date: 06/06/2019 CLINICAL DATA:  Code stroke. Fell and hit head with right-sided weakness and slurred speech. EXAM: CT HEAD WITHOUT CONTRAST TECHNIQUE: Contiguous axial images were obtained from the base of the skull  through the vertex without intravenous contrast. COMPARISON:  07/03/2013 FINDINGS: There is motion artifact through the skull base. Brain: There is a focal collection of acute extra-axial hemorrhage in the left interhemispheric fissure at the vertex measuring 3.3 x 1.9 cm which may be subdural or subarachnoid. Additional small volume subdural or subarachnoid hemorrhage is also present more inferiorly and anteriorly in the interhemispheric fissure to the left of midline. Scattered small areas of hyperattenuation along the right greater than left anterior frontal lobes may represent artifact versus a small amount of additional subarachnoid hemorrhage. No acute infarct or midline shift is evident. There is mild cerebral atrophy. Vascular: Calcified atherosclerosis at the skull base. No hyperdense vessel. Skull: No fracture or suspicious osseous lesion. Sinuses/Orbits: Small right maxillary sinus. Clear paranasal sinuses and mastoid air cells. Bilateral cataract extraction. Other: Mild posterior scalp swelling/small hematoma. ASPECTS [facility] Stroke Program Early CT Score) Not scored due to the presence of hemorrhage. IMPRESSION: 1. Extra-axial hemorrhage in the left interhemispheric fissure as above. No significant mass effect. 2. No acute infarct identified. These results were communicated to Dr. Cheral Marker at 4:02 pm on 06/06/2019 by text page via the [facility] messaging system. Electronically Signed   By: Logan Bores M.D.   On: 06/06/2019 16:04       Assessment/Plan: Diagnosis: Left parafalcine subdural hematoma and subarachnoid hemorrhage 1. Does the need for close, 24 hr/day medical supervision in concert with the patient's rehab needs make it unreasonable for this patient to be served in a less intensive setting? Yes 2. Co-Morbidities requiring supervision/potential complications: PAF on Xarelto, MI, LV thrombus, ICM, HTN, HLD, CAD, colon cancer s/p surgery, dysphagia 3. Due to bladder management, bowel  management, safety, skin/wound care, disease management, medication administration, pain management and patient education, does the patient require 24 hr/day rehab nursing? Yes 4. Does the patient require coordinated care of a physician, rehab nurse, therapy disciplines of PT, OT, SLP to address physical and functional deficits in the context of the above medical diagnosis(es)? Yes Addressing deficits in the following areas: balance, endurance, locomotion, strength, transferring, bowel/bladder control, bathing, dressing, feeding, grooming, toileting, cognition, speech, language, swallowing and psychosocial support 5. Can the patient actively participate in an intensive therapy program of at least 3 hrs of therapy per day at least 5 days per week? Yes 6. The potential for patient to make measurable  gains while on inpatient rehab is excellent 7. Anticipated functional outcomes upon discharge from inpatient rehab are min assist  with PT, min assist with OT, supervision with SLP. 8. Estimated rehab length of stay to reach the above functional goals is: 2-3 weeks 9. Anticipated discharge destination: Home 10. Overall Rehab/Functional Prognosis: excellent   RECOMMENDATIONS: This patient's condition is appropriate for continued rehabilitative care in the following setting: CIR Patient has agreed to participate in recommended program. Yes Note that insurance prior authorization may be required for reimbursement for recommended care.   Comment: Mr. Aldis would be an excellent CIR candidate. He has excellent support from multiple family members and friends.  Please consider Trazodone HS to help with insomnia and Claritin daily to help with allergies. Thank you for this consult.    Bary Leriche, PA-C 06/08/2019    I have personally performed a face to face diagnostic evaluation, including, but not limited to relevant history and physical exam findings, of this patient and developed relevant assessment and  plan.  Additionally, I have reviewed and concur with the physician assistant's documentation above.   Leeroy Cha, MD        Revision History                     Routing History

## 2019-06-14 NOTE — H&P (Addendum)
Physical Medicine and Rehabilitation Admission H&P    Chief Complaint  Patient presents with  . ICH with functional deficits.    HPI:  Mark Clayton is a 78 year old male with history of colon cancer, Chronic systolic CHF with AICD, PAF, anemia, macular degeneration--legally blind, LV thrombus in the past who was admitted on  06/06/19 for work-up after falling backwards in the yard, striking his head and developing left-sided weakness.  CT head showed left hemispheric SDH therefore Xarelto was reversed with Kcentra.  CTA/perfusion of head/neck showed branch occlusion left M3 with trickle flow corresponding acute infarct left parietal lobe with surrounding penumbra.  Neurology felt left-MCA infarct was embolic due to A. fib and no antithrombotic due to hemorrhage.  Dr. Trenton Gammon recommended conservative management with serial CT head for monitoring.  BP was elevated at admission and has been treated with Cleviprex.  2D echo showed global hypokinesis with EF of 20%, aortic sclerosis without stenosis and moderately elevated pulmonary pressures.    Follow-up CT head showed stable bleed with development of left frontoparietal infarct.  Dr. Erlinda Hong felt that patien tiwth embolic stroke due to AF and severe cardiomyopathy--SDH due to fall with stroke. Currently off antithrombotics and no AC due to SDH---To consider resuming AC in 2-3 weeks if SDH resolving and clinically stable.  Patient reported chocking on foods and MBS done revealing oropharyngeal dysphagia and residue--to continue dysphagia 2, thins with multiple hard swallows and throat clears.  He developed pre-renal azotemia and was treated with IVF X 24 hours.  He developed cough with upward trend in weight by 6 lbs and continues on IV lasix for diuresis for acute on chronic CHF--to transition back to oral when back to baseline weight of 74 kg. Therapy has been ongoing and patient limited by dysarthria with expressive>receptive aphasia, paraphasias, right  hemiparesis. CIR recommended due to functional decline.    Review of Systems  Unable to perform ROS: Other  Constitutional: Negative for chills and fever.  HENT: Negative for hearing loss and tinnitus.   Respiratory: Negative for cough (getting better) and shortness of breath.   Cardiovascular: Negative for chest pain and palpitations.  Gastrointestinal: Negative for diarrhea and heartburn.  Musculoskeletal: Negative for myalgias.  Neurological: Positive for focal weakness. Negative for dizziness and headaches.  Psychiatric/Behavioral: The patient does not have insomnia.      Past Medical History:  Diagnosis Date  . AICD (automatic cardioverter/defibrillator) present 01/17/2003   Medtronic Maximo 7232CX ICD, serial [id number]  . Anemia 02-06-11   takes oral iron  . Arthritis    hands, knees  . CAD (coronary artery disease) 2003   a. h/o MI and CABG in 2003. b. s/p DES to SVG-RPDA-RPLB in 08/2014.  Marland Kitchen Cancer of sigmoid colon ([place]) 2012   a. s/p colon surgery.  . Carotid bruit   . Chronic systolic CHF (congestive heart failure) (HCC)    a. EF 20% in 2014; b. 08/2017 Echo: EF 20-25%, diff HK, Gr3 DD. Triv AI. Mod MR. Sev dil LA. Mildly dil RV w/ mildly reduced RV fxn. Mildly dil RA. Mod TR. PASP 26mHg.  Marland Kitchen Cough   . GERD (gastroesophageal reflux disease) 02-06-11  . HTN (hypertension)   . Hyperlipidemia   . Ischemic cardiomyopathy    a. EF 20% in 2014. (Master study EF >20%); b. 08/2017 Echo: EF 20-25%, diff HK. Gr3 DD.  . LV (left ventricular) mural thrombus    a. 12/2012 Echo: EF 20% with mural thrombus No  evidence of thrombus on 08/2017 echo.  . Macular degeneration   . Myocardial infarct ([place])    2003  . PAF (paroxysmal atrial fibrillation) (HCC)    a. CHA2DS2VASc = 5-->Xarelto/Tikosyn.    Past Surgical History:  Procedure Laterality Date  . CARDIAC CATHETERIZATION N/A 09/20/2014   Procedure: Left Heart Cath and Coronary Angiography;  Surgeon: Jettie Booze, MD; LAD  95%, D1 100%, CFX liner percent, OM 200%, OM 390%, RCA 90%, LIMA-LAD okay, SVG-OM 2-OM 3 minimal disease, SVG-RPDA-RPLB 100% between the RPDA and RPL     . CARDIAC CATHETERIZATION N/A 09/20/2014   Procedure: Coronary Stent Intervention;  Surgeon: Jettie Booze, MD; Synergy DES 4 x 24 mm reducing the stenosis to 5%   . CARDIAC DEFIBRILLATOR PLACEMENT  01/17/03   6949 lead. Medtronic. remote-no; with later revision  . CARDIOVERSION N/A 05/22/2016   Procedure: CARDIOVERSION;  Surgeon: Lelon Perla, MD;  Location[facility] ENDOSCOPY;  Service: Cardiovascular;  Laterality: N/A;  . CARDIOVERSION N/A 08/10/2017   Procedure: CARDIOVERSION;  Surgeon: Pixie Casino, MD;  Location: [facility] ENDOSCOPY;  Service: Cardiovascular;  Laterality: N/A;  . CATARACT EXTRACTION W/ INTRAOCULAR LENS  IMPLANT, BILATERAL Bilateral June/-July 2009   Dr. Katy Fitch  . COLON RESECTION  02/09/2011   Procedure: LAPAROSCOPIC SIGMOID COLON RESECTION;  Surgeon: Pedro Earls, MD;  Location: [facility] ORS;  Service: General;  Laterality: N/A;  Laparoscopic Assisted Sigmoid Colectomy  . COLON SURGERY    . COLONOSCOPY  08/31/2011   Procedure: COLONOSCOPY;  Surgeon: Jerene Bears, MD;  Location: [facility] ENDOSCOPY;  Service: Gastroenterology;  Laterality: N/A;  . COLONOSCOPY N/A 09/05/2012   Procedure: COLONOSCOPY;  Surgeon: Jerene Bears, MD;  Location: [facility] ENDOSCOPY;  Service: Gastroenterology;  Laterality: N/A;  . COLONOSCOPY N/A 04/18/2013   Procedure: COLONOSCOPY;  Surgeon: Jerene Bears, MD;  Location: [facility] ENDOSCOPY;  Service: Gastroenterology;  Laterality: N/A;  . COLONOSCOPY N/A 04/09/2014   Procedure: COLONOSCOPY;  Surgeon: Jerene Bears, MD;  Location: [facility] ENDOSCOPY;  Service: Gastroenterology;  Laterality: N/A;  . COLONOSCOPY WITH PROPOFOL N/A 05/03/2017   Procedure: COLONOSCOPY WITH PROPOFOL;  Surgeon: Jerene Bears, MD;  Location: [facility] ENDOSCOPY;  Service: Gastroenterology;  Laterality: N/A;  . CORONARY ANGIOPLASTY    . CORONARY ARTERY BYPASS GRAFT   01/2002   LIMA-LAD, SVG-OM 2-OM 3, SVG-RPDA-RPLB  . CORONARY STENT INTERVENTION N/A 11/22/2018   Procedure: CORONARY STENT INTERVENTION;  Surgeon: Nelva Bush, MD;  Location: [facility] CV LAB;  Service: Cardiovascular;  Laterality: N/A;  SVG - RCA  . ICD GENERATOR CHANGE  2010   Medtronic Virtuoso II VR ICD  . ICD GENERATOR CHANGEOUT N/A 12/07/2018   Procedure: ICD GENERATOR CHANGEOUT;  Surgeon: Deboraha Sprang, MD;  Location: [facility] CV LAB;  Service: Cardiovascular;  Laterality: N/A;  . INGUINAL HERNIA REPAIR Right 2000's X 2  . LAPAROSCOPIC RIGHT HEMI COLECTOMY N/A 11/04/2012   Procedure: LAPAROSCOPIC RIGHT HEMI COLECTOMY;  Surgeon: Pedro Earls, MD;  Location: [facility] ORS;  Service: General;  Laterality: N/A;  . LEFT HEART CATH AND CORS/GRAFTS ANGIOGRAPHY Left 11/22/2018   Procedure: LEFT HEART CATH AND CORS/GRAFTS ANGIOGRAPHY;  Surgeon: Nelva Bush, MD;  Location: [facility] CV LAB;  Service: Cardiovascular;  Laterality: Left;  . TONSILLECTOMY  ~ 1950    Family History  Problem Relation Age of Onset  . Hypertension Father   . Hyperlipidemia Father   . Heart disease Father   . Prostate cancer Father   . Alzheimer's disease Mother   .  Hypertension Sister   . Hyperlipidemia Sister   . Colon cancer Neg Hx   . Esophageal cancer Neg Hx   . Rectal cancer Neg Hx   . Stomach cancer Neg Hx     Social History: Widowed. 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 He  reports that he has never smoked. He has never used smokeless tobacco. He reports current alcohol use of about 2.0 standard drinks of alcohol per week. He reports that he does not use drugs.    Allergies  Allergen Reactions  . Latex Rash  . Tape Rash and Other (See Comments)    USE PAPER   Medications Prior to Admission  Medication Sig Dispense Refill  . atorvastatin (LIPITOR) 80 MG tablet Take 1 tablet (80 mg total) by mouth daily. 90 tablet 3  . carvedilol (COREG) 12.5 MG tablet Take 1 tablet  (12.5 mg total) by mouth 2 (two) times daily with a meal. 60 tablet 0  . ezetimibe (ZETIA) 10 MG tablet Take 1 tablet (10 mg total) by mouth daily. 90 tablet 3  . furosemide (LASIX) 40 MG tablet TAKE 1 TABLET BY MOUTH  DAILY 90 tablet 3  . isosorbide mononitrate (IMDUR) 30 MG 24 hr tablet TAKE 1 TABLET BY MOUTH  DAILY (Patient taking differently: Take 30 mg by mouth daily. ) 90 tablet 3  . pantoprazole (PROTONIX) 40 MG tablet TAKE 1 TABLET BY MOUTH  DAILY (Patient taking differently: Take 40 mg by mouth daily. ) 90 tablet 1  . [START ON 06/15/2019] polyethylene glycol (MIRALAX / GLYCOLAX) 17 g packet Take 17 g by mouth daily. 14 each 0  . vitamin B-12 (CYANOCOBALAMIN) 100 MCG tablet Take 100 mcg by mouth daily.       Drug Regimen Review  Drug regimen was reviewed and remains appropriate with no significant issues identified  Home: Home Living Family/patient expects to be discharged to:: Private residence Living Arrangements: Other relatives Available Help at Discharge: Family Type of Home: House Home Access: Other (comment)(threshold) Home Layout: Able to live on main level with bedroom/bathroom Bathroom Shower/Tub: Tub/shower unit Additional Comments: Granddaughter goes to college T-Sa 10-2; states there are other family members who are offering to provide 24/7 i.e. sister lives down street, ex wife, daughters  Lives With: Spouse   Functional History: Prior Function Level of Independence: Independent Comments: 80% blind, does not drive. Manages own medications with intermittent assist from granddaughter for set up, cooks, cleans. Enjoys wood working, Control and instrumentation engineer.    Functional Status:  Mobility: Bed Mobility Overal bed mobility: Needs Assistance Bed Mobility: Rolling, Sidelying to Sit Rolling: Mod assist, +2 for physical assistance Sidelying to sit: Mod assist, +2 for physical assistance Supine to sit: +2 for physical assistance, Mod assist Sit to supine: +2 for physical assistance,  Max assist General bed mobility comments: Pt required cues for hand and foot placement to roll to R side of bed.  He was able to reach for railing with L hand and required assistance to advance [name] to edge of bed.  Once in this position he required min +2 to elevate trunk into sitting and mod +2 to maintain sitting.  Pt bracing with L hand on foot board with posterior lean.  Able to correct with cueing and facilitation. Transfers Overall transfer level: Needs assistance Equipment used: Ambulation equipment used(stedy) Transfer via Lift Equipment: Stedy Transfers: Sit to/from Stand Sit to Stand: Mod assist, +2 physical assistance General transfer comment: Pt able to power up with less deviation  to the R.  Listing remains to the right but not as prominent as it has been in previous session.  Focused on sit to stand reps x 5 from stedy plates and able to move RLE toward extension. Ambulation/Gait Ambulation/Gait assistance: (NT)  ADL: ADL Overall ADL's : Needs assistance/impaired Eating/Feeding: Minimal assistance Grooming: Moderate assistance, Sitting(stedy) Grooming Details (indicate cue type and reason): pt static sitting in stedy at sink with strong R lean. Pt requires everything setup to allow L Ue use for oral care Upper Body Bathing: Moderate assistance, Sitting Lower Body Bathing: Total assistance, Maximal assistance, Bed level Upper Body Dressing : Minimal assistance Lower Body Dressing: Total assistance Lower Body Dressing Details (indicate cue type and reason): pt reports "i didnt do that before all this" General ADL Comments: pt progressed from supine to static standing in stedy. pt with R lean due to R side weakness but with cues able to initiate alignment  Cognition: Cognition Overall Cognitive Status: Impaired/Different from baseline Orientation Level: Oriented X4 Cognition Arousal/Alertness: Awake/alert Behavior During Therapy: WFL for tasks assessed/performed Overall  Cognitive Status: Impaired/Different from baseline Area of Impairment: Safety/judgement Current Attention Level: Focused, Sustained Memory: Decreased short-term memory Following Commands: Follows one step commands inconsistently Safety/Judgement: Decreased awareness of safety, Decreased awareness of deficits Awareness: Intellectual General Comments: pt asking to call family and reports to family that he is in a room with no clothes and 65 degrees. therapist adjusting heat in room closing door. pt state "no i dont want it hot. yall arent listening" Difficult to assess due to: Impaired communication   Blood pressure 138/77, pulse 84, temperature (!) 97.5 F (36.4 C), temperature source Oral, resp. rate 20, height 6' (1.829 m), weight 72.2 kg, SpO2 99 %.  Physical Exam  Nursing note and vitals reviewed. Constitutional: He is oriented to person, place, and time. He appears well-developed and well-nourished.  HEENT: Head is normocephalic, atraumatic, PERRLA, EOMI, sclera anicteric, oral mucosa pink and moist, dentition intact, ext ear canals clear,  Neck: Supple without JVD or lymphadenopathy Heart: Reg rate and rhythm. No murmurs rubs or gallops Chest: CTA bilaterally without wheezes, rales, or rhonchi; no distress Abdomen: Soft, non-tender, non-distended, bowel sounds positive. Extremities: No clubbing, cyanosis, or edema. Pulses are 2+ Skin: Clean and intact without signs of breakdown Neuro: Alert, mild confusion. Right facial weakness with dysarthria. Expressive deficits with occasional jargon. Right hemiparesis. 0/5 throughout right side with exception of slight movement in right foot.  Psych: Pt's affect is appropriate. Pt is cooperative.  Results for orders placed or performed during the hospital encounter of 06/06/19 (from the past 48 hour(s))  Comprehensive metabolic panel     Status: Abnormal   Collection Time: 06/13/19  3:56 AM  Result Value Ref Range   Sodium 139 135 - 145  mmol/L   Potassium 3.2 (L) 3.5 - 5.1 mmol/L   Chloride 102 98 - 111 mmol/L   CO2 24 22 - 32 mmol/L   Glucose, Bld 132 (H) 70 - 99 mg/dL    Comment: Glucose reference range applies only to samples taken after fasting for at least 8 hours.   BUN 17 8 - 23 mg/dL   Creatinine, Ser 1.04 0.61 - 1.24 mg/dL   Calcium 8.7 (L) 8.9 - 10.3 mg/dL   Total Protein 5.5 (L) 6.5 - 8.1 g/dL   Albumin 2.8 (L) 3.5 - 5.0 g/dL   AST 23 15 - 41 U/L   ALT 26 0 - 44 U/L   Alkaline Phosphatase 39  38 - 126 U/L   Total Bilirubin 2.3 (H) 0.3 - 1.2 mg/dL   GFR calc non Af Amer >60 >60 mL/min   GFR calc Af Amer >60 >60 mL/min   Anion gap 13 5 - 15    Comment: Performed at [facility] [address], [place], [place] [address]  Comprehensive metabolic panel     Status: Abnormal   Collection Time: 06/14/19  4:00 AM  Result Value Ref Range   Sodium 138 135 - 145 mmol/L   Potassium 4.0 3.5 - 5.1 mmol/L   Chloride 103 98 - 111 mmol/L   CO2 25 22 - 32 mmol/L   Glucose, Bld 126 (H) 70 - 99 mg/dL    Comment: Glucose reference range applies only to samples taken after fasting for at least 8 hours.   BUN 17 8 - 23 mg/dL   Creatinine, Ser 1.06 0.61 - 1.24 mg/dL   Calcium 8.6 (L) 8.9 - 10.3 mg/dL   Total Protein 5.2 (L) 6.5 - 8.1 g/dL   Albumin 2.7 (L) 3.5 - 5.0 g/dL   AST 22 15 - 41 U/L   ALT 21 0 - 44 U/L   Alkaline Phosphatase 37 (L) 38 - 126 U/L   Total Bilirubin 2.1 (H) 0.3 - 1.2 mg/dL   GFR calc non Af Amer >60 >60 mL/min   GFR calc Af Amer >60 >60 mL/min   Anion gap 10 5 - 15    Comment: Performed at [facility], [place] [address] [place], [place] [address]  Magnesium     Status: None   Collection Time: 06/14/19  4:00 AM  Result Value Ref Range   Magnesium 2.2 1.7 - 2.4 mg/dL    Comment: Performed at [facility] [address], [place], [place] [address]   No results found.  Medical Problem List and Plan: 1.  Left parafalcine subdural hematoma and subarachnoid hemorrhage  -patient  may shower  -ELOS/Goals: 2-3 weeks 2.  Antithrombotics: -DVT/anticoagulation:  Mechanical: Sequential compression devices, below knee Bilateral lower extremities--Follow up CT head in 2 weeks to decide on resuming AC.   -antiplatelet therapy: N/A 3. Pain Management: N/a 4. Mood: LCSW to follow for evaluation and support.   -antipsychotic agents: N/A 5. Neuropsych: This patient is capable of making decisions on his own behalf. 6. Skin/Wound Care: Routine pressure relief measures.  7. Fluids/Electrolytes/Nutrition: Monitor I/O. Check lytes in am. Continue Vitamin B12 supplement 8. Acute on chronic CHF: On Coreg , lipitor, imdur and Lasix.  Monitor for signs of overload--monitor weights daily. Continue Lasix IV bid. Needs to get back to 174 lbs 9. CAD/CAF/ s/p AICD: Continues imdur, Coreg and lipitor. Monitor HR tid.  10. Hypokalemia: Improved post supplement --likely due to increase in diuretics. Will add supplement.  11. ABLA: Will recheck in am.  12. Macular degeneration: Legally blind.   13. Insomnia: Trazodone 25mg  HS PRN  Reesa Chew, PA-C  I have personally performed a face to face diagnostic evaluation, including, but not limited to relevant history and physical exam findings, of this patient and developed relevant assessment and plan.  Additionally, I have reviewed and concur with the physician assistant's documentation above.  The patient's status has not changed. The original post admission physician evaluation remains appropriate, and any changes from the pre-admission screening or documentation from the acute chart are noted above.   Izora Ribas, MD 06/14/2019

## 2019-06-14 NOTE — Discharge Summary (Signed)
Physician Discharge Summary  Mark Clayton [id number] DOB: [birth year] DOA: 06/06/2019  PCP: [name], MD  Admit date: 06/06/2019 Discharge date: 06/14/2019  Recommendations for Outpatient Follow-up:  1. Follow up with neurology as outpatient in 2-3 weeks for determination regarding resumption of Plavix and Xarelto. 2. Follow up with PCP in 7-10 days after discharge from Rehab. 3. Discharge to inpatient rehab.  Follow-up Information    [facility]. Schedule an appointment as soon as possible for a visit in 4 week(s).   Specialty: Neurology Contact information: [address] [place] [phone]         Discharge Diagnoses: Principal diagnosis is #1 1. Intracranial hemorrhage - nonoperative 2. Acute left MCA CVA 3. CAD s/p CABG Plavix held due to hemorrhage 4. PAF: Xarelto held due to hemorrhage 5. Hypertension 6. Hyperlipidemia 7. History of Colon Cancer 8. History of LV thrombus: Xarelto held due to [place] 9. Dehydration 10. Acute on chronic systolic heart failure  Discharge Condition: Fair  Disposition: Inpatient rehab  Diet recommendation: Heart healthy  Filed Weights   06/11/19 0416 06/12/19 0400 06/13/19 0456  Weight: 79 kg 78.1 kg 76.8 kg    History of present illness: Mark Clayton is a 79 y.o. male who has a PMH including but not limited to PAF on xarelto, MI, LV thrombus, ICM, HTN, HLD, CAD, CA of sigmoid colon s/p surgery (see "past medical history" for rest).  He presented to [facility] ED after falling backwards while outside doing yardwork and washing his truck.  He struck his head on a concrete driveway and lost capability to move his right side.  Last seen normal at 1450.  He was brought in as code stroke.  CT head demonstrated extra axial hemorrhage on the left.  CTA head / neck and CTP pending.  MRI deferred due to AICD.  Given hx of chronic xarelto use for PAF, he was given  [place].  He was evaluated by neurology who requested that he be admitted by [facility].   Hospital Course:  79yo with hx PAF on xarelto, MI, LV thrombus, ICM, HTN, HLD, CAD, sigmoid colon CA s/p surgery presented after falling backwards and hitting his head on the concrete, found to have acute extra-axial hemorrhage on L on CT head. Patient was initially admitted to ICU and received KCentra to reverse xarelto. Neurosurgery followed with recommendation for no surgical intervention. During workup, patient was found to have acute L MCA infarct. Stroke team consulted. Patient since transferred to hospitalist service 3/10. Cardiology, neurology, neurosurgery, PCCM, and CIR have been consulted on this patient.The patient has been evaluated by PT/OT. Recommendation was for CIR. Neurology has signed off. They have recommended continued holding of plavix and xarelto due to risk of bleed from [place]. They will re-evaluate the patient in 2-3 weeks from today to determine if it is safe to restart these medications given the patient's history of PAF, intracardiac thrombus, and CAD.  The patient will discharge to CIR later today.  Today's assessment: S: The patient is resting comfortably. No new complaints. O: Vitals:  Vitals:   06/14/19 0821 06/14/19 1303  BP: 115/68 (!) 102/56  Pulse: 91 79  Resp:  19  Temp: 97.8 F (36.6 C) 97.7 F (36.5 C)  SpO2: 97% 96%   Exam:  Constitutional:  . The patient is awake, alert, and oriented x 3. No acute distress. Respiratory:  . No increased work of breathing. . No wheezes, rales, or rhonchi .  No tactile fremitus Cardiovascular:  . Regular rate and rhythm . No murmurs, ectopy, or gallups. . No lateral PMI. No thrills. Abdomen:  . Abdomen is soft, non-tender, non-distended . No hernias, masses, or organomegaly . Normoactive bowel sounds.  Musculoskeletal:  . No cyanosis, clubbing, or edema Skin:  . No rashes, lesions, ulcers . palpation of skin: no  induration or nodules Neurologic:  . CN 2-12 intact . Sensation all 4 extremities intact . Residual right sided weakness Psychiatric:  . Mental status o Mood, affect appropriate o Orientation to person, place, time  . judgment and insight appear intact   Discharge Instructions  Discharge Instructions    Activity as tolerated - No restrictions   Complete by: As directed    Ambulatory referral to Neurology   Complete by: As directed    Follow up with stroke clinic NP (Jessica Vanschaick or Cecille Rubin, if both not available, consider Zachery Dauer, or A[place]) at [facility] in about 2-3 weeks. At this visit determination regarding resumption of Plavix and anticoagulation for LV thrombus should be made.   Call MD for:   Complete by: As directed    Neurological changes   Call MD for:  persistant dizziness or light-headedness   Complete by: As directed    Call MD for:  persistant nausea and vomiting   Complete by: As directed    Diet - low sodium heart healthy   Complete by: As directed    Discharge instructions   Complete by: As directed    Follow up with neurology as outpatient in 2-3 weeks for determination regarding resumption of Plavix and Xarelto.   Increase activity slowly   Complete by: As directed      Allergies as of 06/14/2019      Reactions   Latex Rash   Tape Rash, Other (See Comments)   USE PAPER      Medication List    STOP taking these medications   clopidogrel 75 MG tablet Commonly known as: PLAVIX   dofetilide 250 MCG capsule Commonly known as: TIKOSYN   losartan 50 MG tablet Commonly known as: COZAAR   meclizine 25 MG tablet Commonly known as: ANTIVERT   nitroGLYCERIN 0.4 MG SL tablet Commonly known as: NITROSTAT   pseudoephedrine-acetaminophen 30-500 MG Tabs tablet Commonly known as: TYLENOL SINUS   Xarelto 20 MG Tabs tablet Generic drug: rivaroxaban     TAKE these medications   atorvastatin 80 MG tablet Commonly known as: LIPITOR Take  1 tablet (80 mg total) by mouth daily.   carvedilol 12.5 MG tablet Commonly known as: COREG Take 1 tablet (12.5 mg total) by mouth 2 (two) times daily with a meal. What changed:   medication strength  how much to take  when to take this  Another medication with the same name was removed. Continue taking this medication, and follow the directions you see here.   ezetimibe 10 MG tablet Commonly known as: ZETIA Take 1 tablet (10 mg total) by mouth daily.   furosemide 40 MG tablet Commonly known as: LASIX TAKE 1 TABLET BY MOUTH  DAILY   isosorbide mononitrate 30 MG 24 hr tablet Commonly known as: IMDUR TAKE 1 TABLET BY MOUTH  DAILY   pantoprazole 40 MG tablet Commonly known as: PROTONIX TAKE 1 TABLET BY MOUTH  DAILY   polyethylene glycol 17 g packet Commonly known as: MIRALAX / GLYCOLAX Take 17 g by mouth daily. Start taking on: June 15, 2019   vitamin B-12 100 MCG tablet  Commonly known as: CYANOCOBALAMIN Take 100 mcg by mouth daily.      Allergies  Allergen Reactions  . Latex Rash  . Tape Rash and Other (See Comments)    USE PAPER    The results of significant diagnostics from this hospitalization (including imaging, microbiology, ancillary and laboratory) are listed below for reference.    Significant Diagnostic Studies: CT Code Stroke CTA Head W/WO contrast  Result Date: 06/06/2019 CLINICAL DATA:  Stroke EXAM: CT PERFUSION BRAIN TECHNIQUE: Multiphase CT imaging of the brain was performed following IV bolus contrast injection. Subsequent parametric perfusion maps were calculated using RAPID software. CONTRAST:  110mL OMNIPAQUE IOHEXOL 350 MG/ML SOLN COMPARISON:  CTA head 06/06/2019 FINDINGS: CT Brain Perfusion Findings: CBF (<30%) Volume: 30mL Perfusion (Tmax>6.0s) volume: 47mL Mismatch Volume: 40mL ASPECTS on noncontrast CT Head: Aspect score not calculated due to intracranial hemorrhage. Infarct Core: 9 mL Infarction Location:Left parietal lobe IMPRESSION: 9 mL of  acute infarction left parietal lobe with 81 mL of surrounding penumbra. Electronically Signed   By: Franchot Gallo M.D.   On: 06/06/2019 19:10   CT HEAD WO CONTRAST  Result Date: 06/07/2019 CLINICAL DATA:  Stroke.  Follow-up subdural hemorrhage EXAM: CT HEAD WITHOUT CONTRAST TECHNIQUE: Contiguous axial images were obtained from the base of the skull through the vertex without intravenous contrast. COMPARISON:  CTA head and CT perfusion 06/06/2019 FINDINGS: Brain: High-density acute hemorrhage over the left posterior frontal convexity is stable. This is a combination of subdural blood along the falx and subarachnoid hemorrhage over the convexity. No new hemorrhage. Ventricle size normal. Interval development of hypodensity in the left posterior lateral frontal lobe compatible with acute infarct. Additional area of hypodensity has developed in the left medial frontal parietal lobe compatible with acute infarct. Vascular: Negative for hyperdense vessel Skull: Negative Sinuses/Orbits: Contracted right maxillary sinus. Remaining sinuses clear. Bilateral cataract extraction. Other: None IMPRESSION: Para falcine subdural hematoma unchanged. Subarachnoid and subdural hemorrhage over the left convexity unchanged. Interval development of acute infarct in the left frontal parietal lobe. This involves the medial portion of the motor cortex. There may also be involvement of the lateral portion of the motor cortex on the left. Electronically Signed   By: Franchot Gallo M.D.   On: 06/07/2019 11:32   CT Code Stroke CTA Neck W/WO contrast  Result Date: 06/06/2019 CLINICAL DATA:  Code stroke. Fell and hit head. Intracranial hemorrhage. Slurred speech and right-sided weakness. EXAM: CT ANGIOGRAPHY HEAD AND NECK CT PERFUSION BRAIN TECHNIQUE: Multidetector CT imaging of the head and neck was performed using the standard protocol during bolus administration of intravenous contrast. Multiplanar CT image reconstructions and MIPs  were obtained to evaluate the vascular anatomy. Carotid stenosis measurements (when applicable) are obtained utilizing NASCET criteria, using the distal internal carotid diameter as the denominator. Multiphase CT imaging of the brain was performed following IV bolus contrast injection. Subsequent parametric perfusion maps were calculated using RAPID software. CONTRAST:  100 mL Omnipaque 350 IV COMPARISON:  CT head approximately 2 hours prior FINDINGS: CT HEAD FINDINGS Brain: Acute extra-axial hemorrhage left posterior frontal convexity appears unchanged. This is a combination of subdural para falcine blood as well as adjacent subarachnoid hemorrhage. The largest area of hemorrhage measures approximately 25 x 15 mm. Ventricle size normal. No acute ischemic infarct. Negative for hemorrhage or mass. Vascular: Negative for hyperdense vessel. Arterial calcification diffusely Skull: Negative for skull fracture Sinuses/Orbits: Mucosal edema paranasal sinuses. Contracted right maxillary sinus. Bilateral cataract surgery.  No orbital mass. Other: None ASPECTS (  [place] Stroke Program Early CT Score) Not applicable due to intracranial hemorrhage. CTA NECK FINDINGS Aortic arch: Standard branching. Imaged portion shows no evidence of aneurysm or dissection. No significant stenosis of the major arch vessel origins. Atherosclerotic disease in the aortic arch and proximal great vessels. Pacemaker noted. Right carotid system: Atherosclerotic disease right carotid bifurcation. Approximately 50% diameter stenosis proximal right internal carotid artery. Mild atherosclerotic disease right common carotid artery. Left carotid system: Atherosclerotic disease left carotid bifurcation without significant stenosis. Vertebral arteries: Both vertebral arteries patent to the basilar without significant stenosis. Skeleton: Degenerative changes cervical spine. No acute skeletal abnormality. Other neck: Negative Upper chest: Lung apices clear  bilaterally.  Left-sided pacemaker. Review of the MIP images confirms the above findings CTA HEAD FINDINGS Anterior circulation: Atherosclerotic calcification in the cavernous carotid bilaterally causing mild stenosis. Right middle cerebral artery widely patent. Both anterior cerebral arteries widely patent. Left M1 widely patent. There is a branch occlusion left M3. There is flow past the short segment occlusion with decreased perfusion in this area. Probable additional distal emboli past the occlusion. This corresponds to an area of ischemia on CT perfusion. Posterior circulation: Mild atherosclerotic stenosis of the distal vertebral artery bilaterally. Right PICA patent. Left PICA not visualized. AICA patent bilaterally. Mild atherosclerotic disease in the basilar. Superior cerebellar and posterior cerebral arteries patent bilaterally. Fetal origin right posterior cerebral artery. Venous sinuses: Patent Anatomic variants: None Review of the MIP images confirms the above findings CT Brain Perfusion Findings: ASPECTS: Not scored due to intracranial hemorrhage CBF (<30%) Volume: 13mL Perfusion (Tmax>6.0s) volume: 5mL Mismatch Volume: 19mL Infarction Location:Left parietal lobe IMPRESSION: 1. Left convexity hemorrhage unchanged from earlier today. This appears to be a combination of left parafalcine subdural hematoma and subarachnoid hemorrhage. This is related to trauma given history of fall. No acute ischemic infarct on CT. 2. 50% diameter stenosis proximal right internal carotid artery. Left carotid artery patent without significant stenosis. Both vertebral arteries patent in the neck. 3. Segmental occlusion left M3 branch with trickle distal flow and additional clot distally. 4. CT perfusion demonstrates 9 mL of core infarct and 81 mL of additional ischemia in the left parietal lobe corresponding to the left M3 branch occlusion. 5. These results were called by telephone at the time of interpretation on 06/06/2019  at 6:44 pm to provider ER[facility] , who verbally acknowledged these results. Electronically Signed   By: Franchot Gallo M.D.   On: 06/06/2019 18:45   CT Code Stroke Cerebral Perfusion with contrast  Result Date: 06/06/2019 CLINICAL DATA:  Stroke EXAM: CT PERFUSION BRAIN TECHNIQUE: Multiphase CT imaging of the brain was performed following IV bolus contrast injection. Subsequent parametric perfusion maps were calculated using RAPID software. CONTRAST:  123mL OMNIPAQUE IOHEXOL 350 MG/ML SOLN COMPARISON:  CTA head 06/06/2019 FINDINGS: CT Brain Perfusion Findings: CBF (<30%) Volume: 8mL Perfusion (Tmax>6.0s) volume: 34mL Mismatch Volume: 16mL ASPECTS on noncontrast CT Head: Aspect score not calculated due to intracranial hemorrhage. Infarct Core: 9 mL Infarction Location:Left parietal lobe IMPRESSION: 9 mL of acute infarction left parietal lobe with 81 mL of surrounding penumbra. Electronically Signed   By: Franchot Gallo M.D.   On: 06/06/2019 19:10   DG Chest Portable 2 Views  Result Date: 06/03/2019 CLINICAL DATA:  Cough, x 1 wk. EXAM: CHEST  2 VIEW PORTABLE COMPARISON:  Chest radiograph 07/27/2017 FINDINGS: Stable cardiomediastinal contours with mild cardiomegaly status post median sternotomy and CABG. Aortic arch calcification. Left chest pacer remains in place. The lungs are clear.  No pneumothorax or pleural effusion. No acute finding in the visualized skeleton. IMPRESSION: No acute cardiopulmonary abnormality. Electronically Signed   By: Audie Pinto M.D.   On: 06/03/2019 19:53   DG CHEST PORT 1 VIEW  Result Date: 06/12/2019 EXAM: PORTABLE CHEST 1 VIEW COMPARISON:  None. FINDINGS: LEFT-sided pacemaker overlies normal cardiac silhouette. Sternotomy wires noted. There is central venous congestion and patchy airspace disease new from 08/07/2019. no pneumothorax. No focal consolidation. IMPRESSION: New increasing bilateral airspace disease suggests pulmonary edema. Electronically Signed   By: Suzy Bouchard M.D.   On: 06/12/2019 10:16   DG Chest Port 1 View  Result Date: 06/07/2019 CLINICAL DATA:  CVA. EXAM: PORTABLE CHEST 1 VIEW COMPARISON:  06/02/2019 FINDINGS: The pacer wires/AICD are stable. Stable mild cardiac enlargement. Diffuse slightly nodular interstitial process in the lungs could suggest interstitial edema or interstitial pneumonitis. No focal infiltrates or effusions. IMPRESSION: Diffuse slightly nodular interstitial process in the lungs could suggest interstitial edema or interstitial pneumonitis. Electronically Signed   By: Marijo Sanes M.D.   On: 06/07/2019 08:32   DG Swallowing Func-Speech Pathology  Result Date: 06/07/2019 Objective Swallowing Evaluation: Type of Study: MBS-Modified Barium Swallow Study  Patient Details Name: DEYVON STALLARD MRN: [id number] Date of Birth: Sep 12, 1940 Today's Date: 06/07/2019 Time: SLP Start Time (ACUTE ONLY): 1200 -SLP Stop Time (ACUTE ONLY): 1230 SLP Time Calculation (min) (ACUTE ONLY): 30 min Past Medical History: Past Medical History: Diagnosis Date . AICD (automatic cardioverter/defibrillator) present 01/17/2003  Medtronic Maximo 7232CX ICD, serial [id number] S . Anemia 02-06-11  takes oral iron . Arthritis   hands, knees . CAD (coronary artery disease) 2003  a. h/o MI and CABG in 2003. b. s/p DES to SVG-RPDA-RPLB in 08/2014. Marland Kitchen Cancer of sigmoid colon ([place]) 2012  a. s/p colon surgery. . Carotid bruit  . Chronic systolic CHF (congestive heart failure) (HCC)   a. EF 20% in 2014; b. 08/2017 Echo: EF 20-25%, diff HK, Gr3 DD. Triv AI. Mod MR. Sev dil LA. Mildly dil RV w/ mildly reduced RV fxn. Mildly dil RA. Mod TR. PASP 83mHg. Marland Kitchen Cough  . GERD (gastroesophageal reflux disease) 02-06-11 . HTN (hypertension)  . Hyperlipidemia  . Ischemic cardiomyopathy   a. EF 20% in 2014. (Master study EF >20%); b. 08/2017 Echo: EF 20-25%, diff HK. Gr3 DD. . LV (left ventricular) mural thrombus   a. 12/2012 Echo: EF 20% with mural thrombus No evidence of thrombus on 08/2017 echo. .  Macular degeneration  . Myocardial infarct ([place])   2003 . PAF (paroxysmal atrial fibrillation) (HCC)   a. CHA2DS2VASc = 5-->Xarelto/Tikosyn. Past Surgical History: Past Surgical History: Procedure Laterality Date . CARDIAC CATHETERIZATION N/A 09/20/2014  Procedure: Left Heart Cath and Coronary Angiography;  Surgeon: Jettie Booze, MD; LAD 95%, D1 100%, CFX liner percent, OM 200%, OM 390%, RCA 90%, LIMA-LAD okay, SVG-OM 2-OM 3 minimal disease, SVG-RPDA-RPLB 100% between the RPDA and RPL    . CARDIAC CATHETERIZATION N/A 09/20/2014  Procedure: Coronary Stent Intervention;  Surgeon: Jettie Booze, MD; Synergy DES 4 x 24 mm reducing the stenosis to 5%  . CARDIAC DEFIBRILLATOR PLACEMENT  01/17/03  6949 lead. Medtronic. remote-no; with later revision . CARDIOVERSION N/A 05/22/2016  Procedure: CARDIOVERSION;  Surgeon: Lelon Perla, MD;  [facility] ENDOSCOPY;  Service: Cardiovascular;  Laterality: N/A; . CARDIOVERSION N/A 08/10/2017  Procedure: CARDIOVERSION;  Surgeon: Pixie Casino, MD;  Location: [place];  Service: Cardiovascular;  Laterality: N/A; . CATARACT EXTRACTION W/ INTRAOCULAR LENS  IMPLANT, BILATERAL Bilateral  June/-July 2009  Dr. Katy Fitch . COLON RESECTION  02/09/2011  Procedure: LAPAROSCOPIC SIGMOID COLON RESECTION;  Surgeon: Pedro Earls, MD;  Location: [facility] ORS;  Service: General;  Laterality: N/A;  Laparoscopic Assisted Sigmoid Colectomy . COLON SURGERY   . COLONOSCOPY  08/31/2011  Procedure: COLONOSCOPY;  Surgeon: Jerene Bears, MD;  Location: [facility] ENDOSCOPY;  Service: Gastroenterology;  Laterality: N/A; . COLONOSCOPY N/A 09/05/2012  Procedure: COLONOSCOPY;  Surgeon: Jerene Bears, MD;  Location: [facility] ENDOSCOPY;  Service: Gastroenterology;  Laterality: N/A; . COLONOSCOPY N/A 04/18/2013  Procedure: COLONOSCOPY;  Surgeon: Jerene Bears, MD;  Location: [facility] ENDOSCOPY;  Service: Gastroenterology;  Laterality: N/A; . COLONOSCOPY N/A 04/09/2014  Procedure: COLONOSCOPY;  Surgeon: Jerene Bears, MD;  Location:  [facility] ENDOSCOPY;  Service: Gastroenterology;  Laterality: N/A; . COLONOSCOPY WITH PROPOFOL N/A 05/03/2017  Procedure: COLONOSCOPY WITH PROPOFOL;  Surgeon: Jerene Bears, MD;  Location: [facility] ENDOSCOPY;  Service: Gastroenterology;  Laterality: N/A; . CORONARY ANGIOPLASTY   . CORONARY ARTERY BYPASS GRAFT  01/2002  LIMA-LAD, SVG-OM 2-OM 3, SVG-RPDA-RPLB . CORONARY STENT INTERVENTION N/A 11/22/2018  Procedure: CORONARY STENT INTERVENTION;  Surgeon: Nelva Bush, MD;  Location: [place] CV LAB;  Service: Cardiovascular;  Laterality: N/A;  SVG - RCA . ICD GENERATOR CHANGE  2010  Medtronic Virtuoso II VR ICD . ICD GENERATOR CHANGEOUT N/A 12/07/2018  Procedure: ICD GENERATOR CHANGEOUT;  Surgeon: Deboraha Sprang, MD;  Location: [place] CV LAB;  Service: Cardiovascular;  Laterality: N/A; . INGUINAL HERNIA REPAIR Right 2000's X 2 . LAPAROSCOPIC RIGHT HEMI COLECTOMY N/A 11/04/2012  Procedure: LAPAROSCOPIC RIGHT HEMI COLECTOMY;  Surgeon: Pedro Earls, MD;  Location: [facility];  Service: General;  Laterality: N/A; . LEFT HEART CATH AND CORS/GRAFTS ANGIOGRAPHY Left 11/22/2018  Procedure: LEFT HEART CATH AND CORS/GRAFTS ANGIOGRAPHY;  Surgeon: Nelva Bush, MD;  Location: [place] CV LAB;  Service: Cardiovascular;  Laterality: Left; . TONSILLECTOMY  ~ 1950 HPI: Mark Clayton is a 79 y.o. male who was admitted 3/9 with traumatic ICH. PMH including but not limited to PAF on xarelto, MI, LV thrombus, ICM, HTN, HLD, CAD, CA of sigmoid colon s/p surgery (see "past medical history" for rest). CT (3/9) revealing a combination of left parafalcine subdural hematoma and subarachnoid hemorrhage. This is related to trauma given history of fall. No acute ischemic infarct on CT. Presents with Expressive > receptive apahsia. H/o GERD ad coughing.  Subjective: cooperative Assessment / Plan / Recommendation CHL IP CLINICAL IMPRESSIONS 06/07/2019 Clinical Impression Pt presents with oropharyngeal dysphagia across consistencies. Oral phase is  remarkable for lingual residue. Pharyngeal phase is remarkable for reduced BOT retraction and epiglottic inversion resulting in vallecular and pyriform sinsus residue. Vallecular residue significantly increased with progression of advanced textures, and multiple strategies were attempted to reduce the amount, with the effortful swallow being the most effective. Trace penetration (PAS 3) occured x2 as the pt also utilized a liquid wash to reduce vallecular residue following advanced textures, however, it was not observed following any other textures. There was frequent coughing and throat clearing as seen at bedside, but was not related to aspiration. Given the penetration and amount of residue with advanced textures, recommend Dys 2 and thin liquids, utilizing liquid wash PRN, multiple hard swallows following each bite/sip, clearing throat intermittently. SLP Visit Diagnosis Dysphagia, oropharyngeal phase (R13.12) Attention and concentration deficit following -- Frontal lobe and executive function deficit following -- Impact on safety and function --   CHL IP TREATMENT RECOMMENDATION 06/07/2019 Treatment Recommendations Therapy as outlined  in treatment plan below   Prognosis 06/07/2019 Prognosis for Safe Diet Advancement Good Barriers to Reach Goals -- Barriers/Prognosis Comment -- CHL IP DIET RECOMMENDATION 06/07/2019 SLP Diet Recommendations Dysphagia 2 (Fine chop) solids;Thin liquid Liquid Administration via Cup;Straw;Spoon Medication Administration Crushed with puree Compensations Minimize environmental distractions;Slow rate;Small sips/bites;Multiple dry swallows after each bite/sip;Follow solids with liquid;Effortful swallow Postural Changes Seated upright at 90 degrees;Remain semi-upright after after feeds/meals (Comment)   CHL IP OTHER RECOMMENDATIONS 06/07/2019 Recommended Consults -- Oral Care Recommendations Oral care BID Other Recommendations --   CHL IP FOLLOW UP RECOMMENDATIONS 06/07/2019 Follow up  Recommendations Other (comment)   CHL IP FREQUENCY AND DURATION 06/07/2019 Speech Therapy Frequency (ACUTE ONLY) min 2x/week Treatment Duration 2 weeks      CHL IP ORAL PHASE 06/07/2019 Oral Phase Impaired Oral - Pudding Teaspoon -- Oral - Pudding Cup -- Oral - Honey Teaspoon -- Oral - Honey Cup -- Oral - Nectar Teaspoon -- Oral - Nectar Cup Lingual/palatal residue Oral - Nectar Straw -- Oral - Thin Teaspoon -- Oral - Thin Cup WFL Oral - Thin Straw WFL Oral - Puree WFL Oral - Mech Soft -- Oral - Regular Lingual/palatal residue Oral - Multi-Consistency -- Oral - Pill -- Oral Phase - Comment --  CHL IP PHARYNGEAL PHASE 06/07/2019 Pharyngeal Phase Impaired Pharyngeal- Pudding Teaspoon -- Pharyngeal -- Pharyngeal- Pudding Cup -- Pharyngeal -- Pharyngeal- Honey Teaspoon -- Pharyngeal -- Pharyngeal- Honey Cup -- Pharyngeal -- Pharyngeal- Nectar Teaspoon -- Pharyngeal -- Pharyngeal- Nectar Cup Pharyngeal residue - valleculae;Pharyngeal residue - pyriform;Reduced tongue base retraction;Compensatory strategies attempted (with notebox) Pharyngeal -- Pharyngeal- Nectar Straw -- Pharyngeal -- Pharyngeal- Thin Teaspoon -- Pharyngeal -- Pharyngeal- Thin Cup Pharyngeal residue - valleculae;Pharyngeal residue - pyriform;Reduced tongue base retraction;Penetration/Aspiration during swallow Pharyngeal Material enters airway, remains ABOVE vocal cords then ejected out Pharyngeal- Thin Straw Pharyngeal residue - valleculae;Pharyngeal residue - pyriform;Reduced tongue base retraction Pharyngeal -- Pharyngeal- Puree Pharyngeal residue - pyriform;Pharyngeal residue - valleculae;Reduced tongue base retraction Pharyngeal -- Pharyngeal- Mechanical Soft -- Pharyngeal -- Pharyngeal- Regular Pharyngeal residue - valleculae;Pharyngeal residue - pyriform;Reduced tongue base retraction Pharyngeal -- Pharyngeal- Multi-consistency -- Pharyngeal -- Pharyngeal- Pill -- Pharyngeal -- Pharyngeal Comment --  CHL IP CERVICAL ESOPHAGEAL PHASE 06/07/2019  Cervical Esophageal Phase WFL Pudding Teaspoon -- Pudding Cup -- Honey Teaspoon -- Honey Cup -- Nectar Teaspoon -- Nectar Cup -- Nectar Straw -- Thin Teaspoon -- Thin Cup -- Thin Straw -- Puree -- Mechanical Soft -- Regular -- Multi-consistency -- Pill -- Cervical Esophageal Comment -- Osie Bond., M.A. [facility] [facility] Pager [phone] Office [phone] 06/07/2019, 2:23 PM              ECHOCARDIOGRAM COMPLETE  Result Date: 06/07/2019    ECHOCARDIOGRAM REPORT   Patient Name:   Mark Clayton Date of Exam: 06/07/2019 Medical Rec #:  [id number]      Height:       72.0 in Accession #:    [id number]     Weight:       159.8 lb Date of Birth:  [birth year]      BSA:          1.937 m Patient Age:    25 years       BP:           139/85 mmHg Patient Gender: M              HR:           91 bpm. Exam Location:  Inpatient  Procedure: 2D Echo and Intracardiac Opacification Agent Indications:    Stroke 434.91 / I163.9  History:        Patient has prior history of Echocardiogram examinations, most                 recent 09/03/2017. CAD and Previous Myocardial Infarction,                 Defibrillator, Arrythmias:Atrial Fibrillation,                 Signs/Symptoms:Chest Pain; Risk Factors:Hypertension. Ischemic                 cardiomyopathy.  Sonographer:    Darlina Sicilian RDCS Referring Phys: [id number] [place]  1. No mural thrombus with Definity contrast. Left ventricular ejection fraction, by estimation, is <20%. The left ventricle has severely decreased function. The left ventricle demonstrates global hypokinesis with regional variation. The left ventricular  internal cavity size was moderately to severely dilated.  2. Right ventricular systolic function is moderately reduced. The right ventricular size is mildly enlarged. A AICD wire is visualized. There is moderately elevated pulmonary artery systolic pressure.  3. Left atrial size was moderately dilated.  4. The mitral valve is abnormal. Mild  mitral valve regurgitation.  5. The aortic valve is tricuspid. Aortic valve regurgitation is mild. Mild aortic valve sclerosis is present, with no evidence of aortic valve stenosis.  6. The inferior vena cava is dilated in size with <50% respiratory variability, suggesting right atrial pressure of 15 mmHg. Comparison(s): Changes from prior study are noted. 09/03/2017: LVEF 20-25%. FINDINGS  Left Ventricle: No mural thrombus with Definity contrast. Left ventricular ejection fraction, by estimation, is <20%. The left ventricle has severely decreased function. The left ventricle demonstrates global hypokinesis. Definity contrast agent was given IV to delineate the left ventricular endocardial borders. The left ventricular internal cavity size was moderately to severely dilated. There is no left ventricular hypertrophy. Left ventricular diastolic parameters are indeterminate. Right Ventricle: The right ventricular size is mildly enlarged. No increase in right ventricular wall thickness. Right ventricular systolic function is moderately reduced. There is moderately elevated pulmonary artery systolic pressure. The tricuspid regurgitant velocity is 2.82 m/s, and with an assumed right atrial pressure of 15 mmHg, the estimated right ventricular systolic pressure is [id number]. Left Atrium: Left atrial size was moderately dilated. Right Atrium: Right atrial size was normal in size. Pericardium: There is no evidence of pericardial effusion. Mitral Valve: The mitral valve is abnormal. There is mild thickening of the mitral valve leaflet(s). Mild mitral valve regurgitation. Tricuspid Valve: The tricuspid valve is grossly normal. Tricuspid valve regurgitation is mild. Aortic Valve: The aortic valve is tricuspid. Aortic valve regurgitation is mild. Mild aortic valve sclerosis is present, with no evidence of aortic valve stenosis. Pulmonic Valve: The pulmonic valve was grossly normal. Pulmonic valve regurgitation is trivial. Aorta:  The aortic root, ascending aorta, aortic arch and descending aorta are all structurally normal, with no evidence of dilitation or obstruction. Venous: The inferior vena cava is dilated in size with less than 50% respiratory variability, suggesting right atrial pressure of 15 mmHg. IAS/Shunts: No atrial level shunt detected by color flow Doppler. Additional Comments: A AICD wire is visualized.  LEFT VENTRICLE PLAX 2D LVIDd:         6.93 cm LVIDs:         6.15 cm LV PW:         1.01 cm LV IVS:  1.06 cm LVOT diam:     1.90 cm LV SV:         32 LV SV Index:   17 LVOT Area:     2.84 cm  LV Volumes (MOD) LV vol d, MOD A2C: 268.0 ml LV vol d, MOD A4C: 307.5 ml LV vol s, MOD A2C: 210.0 ml LV vol s, MOD A4C: 223.0 ml LV SV MOD A2C:     58.0 ml LV SV MOD A4C:     307.5 ml LV SV MOD BP:      67.7 ml RIGHT VENTRICLE RV S prime:     7.67 cm/s LEFT ATRIUM             Index       RIGHT ATRIUM           Index LA diam:        5.30 cm 2.74 cm/m  RA Area:     24.40 cm LA Vol (A2C):   65.4 ml 33.76 ml/m RA Volume:   77.10 ml  39.81 ml/m LA Vol (A4C):   84.8 ml 43.78 ml/m LA Biplane Vol: 77.2 ml 39.86 ml/m  AORTIC VALVE LVOT Vmax:   83.30 cm/s LVOT Vmean:  48.200 cm/s LVOT VTI:    0.113 m  AORTA Ao Root diam: 3.40 cm Ao Asc diam:  3.50 cm MITRAL VALVE                TRICUSPID VALVE MV Area (PHT): 5.68 cm     TR Peak grad:   31.8 mmHg MV Decel Time: 134 msec     TR Vmax:        282.00 cm/s MV E velocity: 125.50 cm/s                             SHUNTS                             Systemic VTI:  0.11 m                             Systemic Diam: 1.90 cm Lyman Bishop MD Electronically signed by Lyman Bishop MD Signature Date/Time: 06/07/2019/4:27:13 PM    Final    CT HEAD CODE STROKE WO CONTRAST  Result Date: 06/06/2019 CLINICAL DATA:  Code stroke. Fell and hit head with right-sided weakness and slurred speech. EXAM: CT HEAD WITHOUT CONTRAST TECHNIQUE: Contiguous axial images were obtained from the base of the skull through  the vertex without intravenous contrast. COMPARISON:  07/03/2013 FINDINGS: There is motion artifact through the skull base. Brain: There is a focal collection of acute extra-axial hemorrhage in the left interhemispheric fissure at the vertex measuring 3.3 x 1.9 cm which may be subdural or subarachnoid. Additional small volume subdural or subarachnoid hemorrhage is also present more inferiorly and anteriorly in the interhemispheric fissure to the left of midline. Scattered small areas of hyperattenuation along the right greater than left anterior frontal lobes may represent artifact versus a small amount of additional subarachnoid hemorrhage. No acute infarct or midline shift is evident. There is mild cerebral atrophy. Vascular: Calcified atherosclerosis at the skull base. No hyperdense vessel. Skull: No fracture or suspicious osseous lesion. Sinuses/Orbits: Small right maxillary sinus. Clear paranasal sinuses and mastoid air cells. Bilateral cataract extraction. Other: Mild posterior scalp swelling/small hematoma. ASPECTS [facility] Stroke Program  Early CT Score) Not scored due to the presence of hemorrhage. IMPRESSION: 1. Extra-axial hemorrhage in the left interhemispheric fissure as above. No significant mass effect. 2. No acute infarct identified. These results were communicated to Dr. Cheral Marker at 4:02 pm on 06/06/2019 by text page via the [facility] messaging system. Electronically Signed   By: Logan Bores M.D.   On: 06/06/2019 16:04    Microbiology: Recent Results (from the past 240 hour(s))  Respiratory Panel by RT PCR (Flu A&B, Covid) - Nasopharyngeal Swab     Status: None   Collection Time: 06/06/19  4:05 PM   Specimen: Nasopharyngeal Swab  Result Value Ref Range Status   SARS Coronavirus 2 by RT PCR NEGATIVE NEGATIVE Final    Comment: (NOTE) SARS-CoV-2 target nucleic acids are NOT DETECTED. The SARS-CoV-2 RNA is generally detectable in upper respiratoy specimens during the acute phase of infection. The  lowest concentration of SARS-CoV-2 viral copies this assay can detect is 131 copies/mL. A negative result does not preclude SARS-Cov-2 infection and should not be used as the sole basis for treatment or other patient management decisions. A negative result may occur with  improper specimen collection/handling, submission of specimen other than nasopharyngeal swab, presence of viral mutation(s) within the areas targeted by this assay, and inadequate number of viral copies (<131 copies/mL). A negative result must be combined with clinical observations, patient history, and epidemiological information. The expected result is Negative. Fact Sheet for Patients:  PinkCheek.be Fact Sheet for Healthcare Providers:  GravelBags.it This test is not yet ap proved or cleared by the [place] FDA and  has been authorized for detection and/or diagnosis of SARS-CoV-2 by FDA under an Emergency Use Authorization (EUA). This EUA will remain  in effect (meaning this test can be used) for the duration of the COVID-19 declaration under Section 564(b)(1) of the Act, 21 U.S.C. section 360bbb-3(b)(1), unless the authorization is terminated or revoked sooner.    Influenza A by PCR NEGATIVE NEGATIVE Final   Influenza B by PCR NEGATIVE NEGATIVE Final    Comment: (NOTE) The Xpert Xpress SARS-CoV-2/FLU/RSV assay is intended as an aid in  the diagnosis of influenza from Nasopharyngeal swab specimens and  should not be used as a sole basis for treatment. Nasal washings and  aspirates are unacceptable for Xpert Xpress SARS-CoV-2/FLU/RSV  testing. Fact Sheet for Patients: PinkCheek.be Fact Sheet for Healthcare Providers: GravelBags.it This test is not yet approved or cleared by the [place] FDA and  has been authorized for detection and/or diagnosis of SARS-CoV-2 by  FDA under an Emergency  Use Authorization (EUA). This EUA will remain  in effect (meaning this test can be used) for the duration of the  Covid-19 declaration under Section 564(b)(1) of the Act, 21  U.S.C. section 360bbb-3(b)(1), unless the authorization is  terminated or revoked. Performed at [facility], [place] [address], [place], [place] [address]   MRSA PCR Screening     Status: None   Collection Time: 06/06/19  8:08 PM   Specimen: Nasal Mucosa; Nasopharyngeal  Result Value Ref Range Status   MRSA by PCR NEGATIVE NEGATIVE Final    Comment:        The GeneXpert MRSA Assay (FDA approved for NASAL specimens only), is one component of a comprehensive MRSA colonization surveillance program. It is not intended to diagnose MRSA infection nor to guide or monitor treatment for MRSA infections. Performed at [facility], [place] [address], [place], [place] [address]  Labs: Basic Metabolic Panel: Recent Labs  Lab 06/08/19 0509 06/09/19 0448 06/10/19 0426 06/13/19 0356 06/14/19 0400  NA 141 142 139 139 138  K 3.4* 3.6 4.0 3.2* 4.0  CL 108 104 106 102 103  CO2 23 24 21* 24 25  GLUCOSE 134* 114* 116* 132* 126*  BUN 14 22 25* 17 17  CREATININE 1.20 1.12 1.07 1.04 1.06  CALCIUM 8.9 9.1 8.7* 8.7* 8.6*  MG 2.2  --   --   --  2.2  PHOS 3.0  --   --   --   --    Liver Function Tests: Recent Labs  Lab 06/09/19 0448 06/10/19 0426 06/13/19 0356 06/14/19 0400  AST 31 38 23 22  ALT 16 25 26 21   ALKPHOS 38 36* 39 37*  BILITOT 1.9* 1.9* 2.3* 2.1*  PROT 5.7* 5.3* 5.5* 5.2*  ALBUMIN 3.0* 2.9* 2.8* 2.7*   No results for input(s): LIPASE, AMYLASE in the last 168 hours. No results for input(s): AMMONIA in the last 168 hours. CBC: Recent Labs  Lab 06/08/19 0509  WBC 8.3  HGB 12.0*  HCT 36.6*  MCV 88.4  PLT 163   Cardiac Enzymes: No results for input(s): CKTOTAL, CKMB, CKMBINDEX, TROPONINI in the last 168 hours. BNP: BNP (last 3 results) No results for input(s): BNP in the last  8760 hours.  ProBNP (last 3 results) Recent Labs    08/26/18 0856 03/13/19 1028  PROBNP 382.0* 410.0*    CBG: No results for input(s): GLUCAP in the last 168 hours.  Active Problems:   ICH (intracerebral hemorrhage) ([place])   Time coordinating discharge: 38 minutes.  Signed:        Sherika Kubicki, DO [facility]  06/14/2019, 3:10 PM

## 2019-06-14 NOTE — Plan of Care (Signed)
  Problem: Education: Goal: Knowledge of General Education information will improve Description: Including pain rating scale, medication(s)/side effects and non-pharmacologic comfort measures Outcome: Progressing   Problem: Health Behavior/Discharge Planning: Goal: Ability to manage health-related needs will improve Outcome: Progressing   Problem: Clinical Measurements: Goal: Ability to maintain clinical measurements within normal limits will improve Outcome: Progressing Goal: Will remain free from infection Outcome: Progressing Goal: Diagnostic test results will improve Outcome: Progressing Goal: Respiratory complications will improve Outcome: Progressing Goal: Cardiovascular complication will be avoided Outcome: Progressing   Problem: Activity: Goal: Risk for activity intolerance will decrease Outcome: Progressing   Problem: Nutrition: Goal: Adequate nutrition will be maintained Outcome: Progressing   Problem: Coping: Goal: Level of anxiety will decrease Outcome: Progressing   Problem: Elimination: Goal: Will not experience complications related to bowel motility Outcome: Progressing Goal: Will not experience complications related to urinary retention Outcome: Progressing   Problem: Pain Managment: Goal: General experience of comfort will improve Outcome: Progressing   Problem: Safety: Goal: Ability to remain free from injury will improve Outcome: Progressing   Problem: Skin Integrity: Goal: Risk for impaired skin integrity will decrease Outcome: Progressing   Problem: Education: Goal: Knowledge of disease or condition will improve Outcome: Progressing Goal: Knowledge of secondary prevention will improve Outcome: Progressing Goal: Knowledge of patient specific risk factors addressed and post discharge goals established will improve Outcome: Progressing Goal: Individualized Educational Video(s) Outcome: Progressing   Problem: Coping: Goal: Will verbalize  positive feelings about self Outcome: Progressing Goal: Will identify appropriate support needs Outcome: Progressing   Problem: Health Behavior/Discharge Planning: Goal: Ability to manage health-related needs will improve Outcome: Progressing   Problem: Self-Care: Goal: Ability to participate in self-care as condition permits will improve Outcome: Progressing Goal: Verbalization of feelings and concerns over difficulty with self-care will improve Outcome: Progressing Goal: Ability to communicate needs accurately will improve Outcome: Progressing   Problem: Nutrition: Goal: Risk of aspiration will decrease Outcome: Progressing Goal: Dietary intake will improve Outcome: Progressing   Problem: Intracerebral Hemorrhage Tissue Perfusion: Goal: Complications of Intracerebral Hemorrhage will be minimized Outcome: Progressing   

## 2019-06-14 NOTE — Progress Notes (Signed)
Patient arrived to CIR with RN and patient belongings. Oriented to rehab process, schedule, fall prevention plan, safety plan, nurse call system. Patient had no complaints of pain and is resting in bed with bed alarm on and call bell at side.

## 2019-06-14 NOTE — Progress Notes (Signed)
LATE ENTRY NOTE 06/14/19   Inpatient Rehabilitation-Admissions Coordinator   Met with pt bedside. He is still interested in IP Rehab at this time. I have received medical clearance from Dr. Benny Lennert for admit to CIR today. I have discussed admission with his granddaughter and reviewed the insurance and consent forms with both the patient bedside and his sister via phone per his request. All questions answered. RN and [facility] team aware of plan for admit to CIR today.   Raechel Ache, OTR/L  Rehab Admissions Coordinator  [phone] 06/14/2019 6:54 PM

## 2019-06-14 NOTE — Progress Notes (Signed)
Physical Therapy Treatment Patient Details Name: Mark Clayton MRN: [id number] DOB: 01-24-1941 Today's Date: 06/14/2019    History of Present Illness Mark Clayton is a 79 y.o. male who was admitted 3/9 with traumatic ICH after falling backwards onto concrete. PMH including PAF on xarelto, MI, LV thrombus, ICM, HTN, HLD, CAD, CA of sigmoid colon s/p surgery. CT (3/9) revealing a combination of left parafalcine subdural hematoma and subarachnoid hemorrhage. No acute ischemic infarct on CT.     PT Comments    Pt supine in bed on arrival this session.  He reports bowel incontinence and required rolling to R and L to clean patient of urinary incontinence.  He continues to require +2 assistance to progress to standing.  Focused on sit to stands within sara stedy from [place] with cues and facilitation to R quad.  Pt to d/c to CIR this pm.     Follow Up Recommendations  CIR;Supervision/Assistance - 24 hour     Equipment Recommendations  Wheelchair (measurements PT);Wheelchair cushion (measurements PT)    Recommendations for Other Services       Precautions / Restrictions Precautions Precautions: Fall;Other (comment) Precaution Comments: R hemiparesis, check orthostatics Restrictions Weight Bearing Restrictions: No    Mobility  Bed Mobility Overal bed mobility: Needs Assistance Bed Mobility: Rolling;Sidelying to Sit;Sit to Supine Rolling: Mod assist;+2 for physical assistance Sidelying to sit: Mod assist;+2 for physical assistance Supine to sit: +2 for physical assistance;Mod assist     General bed mobility comments: Pt required cues for hand and foot placement to roll to R side of bed.  He was able to reach for railing with L hand and required assistance to advance [name] to edge of bed.  Once in this position he required min +2 to elevate trunk into sitting and mod +2 to maintain sitting.  Pt bracing with L hand on foot board with posterior lean.  Able to correct with cueing  and facilitation.  Transfers Overall transfer level: Needs assistance Equipment used: Ambulation equipment used(sara stedy) Transfers: Sit to/from Stand Sit to Stand: Mod assist;+2 physical assistance         General transfer comment: Pt able to power up with less deviation to the R.  Listing remains to the right but not as prominent as it has been in previous session.  Focused on sit to stand reps x 5 from stedy plates and able to move RLE toward extension.  Ambulation/Gait                 Stairs             Wheelchair Mobility    Modified Rankin (Stroke Patients Only)       Balance Overall balance assessment: Needs assistance Sitting-balance support: Bilateral upper extremity supported;Feet supported Sitting balance-Leahy Scale: Poor Sitting balance - Comments: zero to poor when fatigued unable to maintain sitting without adequate support. Postural control: Right lateral lean   Standing balance-Leahy Scale: Poor                              Cognition Arousal/Alertness: Awake/alert Behavior During Therapy: WFL for tasks assessed/performed Overall Cognitive Status: Impaired/Different from baseline Area of Impairment: Safety/judgement                   Current Attention Level: Focused;Sustained Memory: Decreased short-term memory Following Commands: Follows one step commands inconsistently Safety/Judgement: Decreased awareness of safety;Decreased awareness of deficits Awareness:  Intellectual          Exercises      General Comments        Pertinent Vitals/Pain Pain Assessment: No/denies pain Faces Pain Scale: No hurt    Home Living                      Prior Function            PT Goals (current goals can now be found in the care plan section) Acute Rehab PT Goals Patient Stated Goal: to go to rehab Potential to Achieve Goals: Fair Progress towards PT goals: Progressing toward goals    Frequency     Min 4X/week      PT Plan Current plan remains appropriate    Co-evaluation              AM-PAC PT "6 Clicks" Mobility   Outcome Measure  Help needed turning from your back to your side while in a flat bed without using bedrails?: A Lot Help needed moving from lying on your back to sitting on the side of a flat bed without using bedrails?: A Lot Help needed moving to and from a bed to a chair (including a wheelchair)?: A Lot Help needed standing up from a chair using your arms (e.g., wheelchair or bedside chair)?: A Lot Help needed to walk in hospital room?: Total Help needed climbing 3-5 steps with a railing? : Total 6 Click Score: 10    End of Session Equipment Utilized During Treatment: Gait belt Activity Tolerance: Patient tolerated treatment well Patient left: in bed;with bed alarm set Nurse Communication: Mobility status PT Visit Diagnosis: Other symptoms and signs involving the nervous system (R29.898);Hemiplegia and hemiparesis;Other abnormalities of gait and mobility (R26.89) Hemiplegia - Right/Left: Right Hemiplegia - dominant/non-dominant: Dominant     Time: [id number] PT Time Calculation (min) (ACUTE ONLY): 23 min  Charges:  $Therapeutic Activity: 23-37 mins                     Mark Clayton , PTA Acute Rehabilitation Services Pager [phone] Office [phone]     Mark Clayton Mark Clayton 06/14/2019, 5:48 PM

## 2019-06-14 NOTE — Chronic Care Management (AMB) (Deleted)
Chronic Care Management Pharmacy  Name: MYCHAEL HOLROYD  MRN: [id number] DOB: 03/03/1941   Chief Complaint/ HPI  Mark Clayton,  79 y.o. , male presents for their Initial CCM visit with the clinical pharmacist via telephone due to COVID-19 Pandemic.  PCP : [name], MD  Their chronic conditions include: systolic HF, CAD, HTN, hx ICH, ICD in place  Office Visits:***  Consult Visit:***  Medications: Facility-Administered Encounter Medications as of 06/14/2019  Medication  . atorvastatin (LIPITOR) tablet 80 mg  . benzonatate (TESSALON) capsule 200 mg  . carvedilol (COREG) tablet 12.5 mg  . chlorhexidine (PERIDEX) 0.12 % solution 15 mL  . Chlorhexidine Gluconate Cloth 2 % PADS 6 each  . ezetimibe (ZETIA) tablet 10 mg  . furosemide (LASIX) injection 40 mg  . hydrALAZINE (APRESOLINE) injection 10-40 mg  . isosorbide mononitrate (IMDUR) 24 hr tablet 30 mg  . MEDLINE mouth rinse  . oxyCODONE-acetaminophen (PERCOCET/ROXICET) 5-325 MG per tablet 1 tablet  . pantoprazole (PROTONIX) EC tablet 40 mg  . polyethylene glycol (MIRALAX / GLYCOLAX) packet 17 g  . vitamin B-12 (CYANOCOBALAMIN) tablet 100 mcg  . [DISCONTINUED] furosemide (LASIX) tablet 40 mg  . [DISCONTINUED] lactated ringers infusion   Outpatient Encounter Medications as of 06/14/2019  Medication Sig  . atorvastatin (LIPITOR) 80 MG tablet Take 1 tablet (80 mg total) by mouth daily.  . carvedilol (COREG) 25 MG tablet Take 25 mg by mouth at bedtime.  . carvedilol (COREG) 6.25 MG tablet Take 1 tablet (6.25 mg total) by mouth 2 (two) times daily with a meal. (Patient not taking: Reported on 06/06/2019)  . clopidogrel (PLAVIX) 75 MG tablet Take 1 tablet (75 mg total) by mouth daily with breakfast.  . dofetilide (TIKOSYN) 250 MCG capsule TAKE 1 CAPSULE BY MOUTH TWO TIMES DAILY (Patient not taking: Reported on 06/06/2019)  . ezetimibe (ZETIA) 10 MG tablet Take 1 tablet (10 mg total) by mouth daily.  . furosemide (LASIX)  40 MG tablet TAKE 1 TABLET BY MOUTH  DAILY  . isosorbide mononitrate (IMDUR) 30 MG 24 hr tablet TAKE 1 TABLET BY MOUTH  DAILY (Patient taking differently: Take 30 mg by mouth daily. )  . losartan (COZAAR) 50 MG tablet TAKE 1 TABLET BY MOUTH  DAILY  . meclizine (ANTIVERT) 25 MG tablet TAKE 1 TABLET BY MOUTH  DAILY AS NEEDED FOR  DIZZINESS. (Patient not taking: Reported on 06/06/2019)  . nitroGLYCERIN (NITROSTAT) 0.4 MG SL tablet DISSOLVE 1 TABLET UNDER THE TONGUE EVERY 5 MINUTES AS  NEEDED FOR CHEST PAIN. MAX  OF 3 TABLETS IN 15 MINUTES. CALL 911 IF PAIN PERSISTS. (Patient taking differently: Place 0.4 mg under the tongue every 5 (five) minutes as needed. )  . pantoprazole (PROTONIX) 40 MG tablet TAKE 1 TABLET BY MOUTH  DAILY (Patient taking differently: Take 40 mg by mouth daily. )  . pseudoephedrine-acetaminophen (TYLENOL SINUS) 30-500 MG TABS tablet Take 1 tablet by mouth every 4 (four) hours as needed.  . vitamin B-12 (CYANOCOBALAMIN) 100 MCG tablet Take 100 mcg by mouth daily.   Alveda Reasons 20 MG TABS tablet TAKE 1 TABLET BY MOUTH  DAILY WITH SUPPER (Patient not taking: Reported on 06/06/2019)  . [DISCONTINUED] furosemide (LASIX) 40 MG tablet TAKE 1 TABLET BY MOUTH  DAILY (Patient taking differently: Take 40 mg by mouth daily. )     Current Diagnosis/Assessment:  Goals Addressed   None     Heart Failure   Type: Systolic   Last ejection fraction: EF <  20% NYHA Class: {CHL HP Upstream Pharm NYHA Class:[phone]} AHA HF Stage: {CHL HP Upstream Pharm AHA HF Stage:[phone]}  Patient has failed these meds in past: *** Patient is currently {CHL Controlled/Uncontrolled:[id number]} on the following medications: ***  We discussed {CHL HP Upstream Pharmacy [phone]}  Plan  Continue {CHL HP Upstream Pharmacy PH:[id number]

## 2019-06-14 NOTE — Progress Notes (Signed)
Orthopedic Tech Progress Note Patient Details:  Mark Clayton 1941-03-22 [id number]  Patient ID: [name], male   DOB: 1940/04/03, 79 y.o.   MRN: [id number]  Called in an order to hanger for WHO, Cock-up, PRAFO [name] 06/14/2019, 6:33 PM

## 2019-06-14 NOTE — Progress Notes (Signed)
Orthopedic Tech Progress Note Patient Details:  Mark Clayton September 01, 1940 [id number]  Ortho Devices Type of Ortho Device: Arm sling Ortho Device/Splint Location: RUE Ortho Device/Splint Interventions: Ordered, Other (comment)   Post Interventions Patient Tolerated: Other (comment) Instructions Provided: Other (comment)      Post Interventions Patient Tolerated: Other (comment) Instructions Provided: Other (comment)  Left in patients room and only to be worn with therapy [name] [name] 06/14/2019, 6:36 PM

## 2019-06-14 NOTE — Progress Notes (Signed)
Raechel Ache, OT  Rehab Admission Coordinator  Physical Medicine and Rehabilitation  PMR Pre-admission     Signed  Date of Service:  06/10/2019  5:49 PM      Related encounter: ED to Hosp-Admission (Discharged) from 06/06/2019 in [facility]      Signed        PMR Admission Coordinator Pre-Admission Assessment   Patient: Mark Clayton is an 79 y.o., male MRN: [id number] DOB: March 25, 1941 Height:   Weight: 76.8 kg                                                                                                                                                  Insurance Information HMO: yes    PPO:      PCP:      IPA:      80/20:      OTHER:  PRIMARY: [employer] Medicare      Policy#: [id number]      Subscriber: patient CM Name: Mark Clayton      Phone#: [phone] opt [phone]     Fax#: [phone] Pre-Cert#: [id number]      Employer:  Mark Clayton provided by Mark Clayton on 3/15 for admit to CIR with start date 3/15. Pt is approved for 8 days with next review date 3/22. Clinical are to be faxed to (f): [phone] (p): [phone] opt 3 Benefits:  Phone #: online      Name: [email or web address] Eff. Date: 03/31/19-03/29/20     Deduct: does not have ($0)      Out of Pocket Max: $3,600 ($832.60)      Life Max: NA CIR: $295/day co-pay for days 1-5, $0/day co-pay for days 6+      SNF: $0/day co-pay for days 1-20, $184/day co-pay for days 21-40, $0/day co-pay for days 41-100; limited to 100 days/cal yr Outpatient: $30/visit co-pay; limited by medical necessity     Home Health: 100% coverage      Co-Pay: 0% co-insurance; limited by medical necessity DME: 80% coverage     Co-Pay: 20% co-insurance Providers:  SECONDARY: none       Policy#:       Subscriber:  CM Name:       Phone#:      Fax#:  Pre-Cert#:       Employer:  Benefits:  Phone #:      Name: Eff. Date:      Deduct:       Out of Pocket Max:      Life Max:  CIR:       SNF:  Outpatient:      Co-Pay: Home Health:       Co-Pay:  DME:       Co-Pay:    Medicaid Application Date:       Case Manager:  Disability Application Date:  Case Worker:    The Data Collection Information Summary for patients in Inpatient Rehabilitation Facilities with attached Privacy Act [place] Records was provided and verbally reviewed with: Patient and Family   Emergency Contact Information         Contact Information     Name Relation Home Work Mark Clayton Daughter [phone]   [phone]    Mark Clayton Daughter     [phone]    Mark Clayton Daughter     [phone]    Mark Clayton     [phone]       Current Medical History  Patient Admitting Diagnosis: Left parafalcine subdural hematoma and subarachnoid hemorrhage   History of Present Illness:  Mark Clayton is a 79 y.o. male with history of CAD, colon cancer, Macular degeneration--legally blind, chronic systolic CHF s/p AICD, PAF- on Xarelto who was admitted on 06/06/19 for work up after falling backwards in the yard, sticking his head and developing left sided weakness. CT head showed left hemispheric SDH and Xarelto reversed with Kcentra.  CTA head/neck/perfusion showed branch occlusion left M3 with corresponding acute infarct in left parietal lobe with surrounding penumbra. Neurology fel that L-MCA infarct embolic due to a fib and no antithrombotic due to hemorrhage. Dr. Annette Stable recommended conservative management with serial CT head.   Elevated BP treated with cleviprex.  2D echo showed global hypokinesis with EF < 20% and moderately elevated pulmonary pressures. Follow up CT head showed stable bleed with interval development of left frontoparietal infarct. Mentation improving and therapy evaluations completed today. Patient with resultant right hemiparesis with right inattention, expressive > receptive aphasia with dysarthria and visual deficits affecting mobility and ADLs. On 3/15, pt was noted to have tachypnea on exam with possible CHF  exacerbation. Pt was transitioned to IV lasix and daily weight monitored. CIR recommended due to functional decline. Pt is to be admitted to CIR on 06/14/19   Complete NIHSS TOTAL: 14 Glasgow Coma Scale Score: 15   Past Medical History      Past Medical History:  Diagnosis Date   AICD (automatic cardioverter/defibrillator) present 01/17/2003    Medtronic Maximo 7232CX ICD, serial [id number] S   Anemia 02-06-11    takes oral iron   Arthritis      hands, knees   CAD (coronary artery disease) 2003    a. h/o MI and CABG in 2003. b. s/p DES to SVG-RPDA-RPLB in 08/2014.   Cancer of sigmoid colon ([place]) 2012    a. s/p colon surgery.   Carotid bruit     Chronic systolic CHF (congestive heart failure) ([place])      a. EF 20% in 2014; b. 08/2017 Echo: EF 20-25%, diff HK, Gr3 DD. Triv AI. Mod MR. Sev dil LA. Mildly dil RV w/ mildly reduced RV fxn. Mildly dil RA. Mod TR. PASP 44mHg.   Cough     GERD (gastroesophageal reflux disease) 02-06-11   HTN (hypertension)     Hyperlipidemia     Ischemic cardiomyopathy      a. EF 20% in 2014. (Master study EF >20%); b. 08/2017 Echo: EF 20-25%, diff HK. Gr3 DD.   LV (left ventricular) mural thrombus      a. 12/2012 Echo: EF 20% with mural thrombus No evidence of thrombus on 08/2017 echo.   Macular degeneration     Myocardial infarct ([place])      2003   PAF (paroxysmal atrial fibrillation) ([facility])      a. CHA2DS2VASc = 5-->Xarelto/Tikosyn.  Family History  family history includes Alzheimer's disease in his mother; Heart disease in his father; Hyperlipidemia in his father and sister; Hypertension in his father and sister; Prostate cancer in his father.   Prior Rehab/Hospitalizations:  Has the patient had prior rehab or hospitalizations prior to admission? Yes   Has the patient had major surgery during 100 days prior to admission? No   Current Medications    Current Facility-Administered Medications:    atorvastatin (LIPITOR) tablet 80 mg,  80 mg, Oral, q1800, Rosalin Hawking, MD, 80 mg at 06/13/19 1724   benzonatate (TESSALON) capsule 200 mg, 200 mg, Oral, BID PRN, Blount, Xenia T, NP, 200 mg at 06/12/19 2126   carvedilol (COREG) tablet 12.5 mg, 12.5 mg, Oral, BID WC, Rosalin Hawking, MD, 12.5 mg at 06/14/19 0920   chlorhexidine (PERIDEX) 0.12 % solution 15 mL, 15 mL, Mouth Rinse, BID, Rosalin Hawking, MD, 15 mL at 06/14/19 0919   Chlorhexidine Gluconate Cloth 2 % PADS 6 each, 6 each, Topical, Daily, Rosalin Hawking, MD, 6 each at 06/14/19 1100   ezetimibe (ZETIA) tablet 10 mg, 10 mg, Oral, Daily, Rosalin Hawking, MD, 10 mg at 06/13/19 1724   furosemide (LASIX) injection 40 mg, 40 mg, Intravenous, BID, Donne Hazel, MD, 40 mg at 06/14/19 0920   hydrALAZINE (APRESOLINE) injection 10-40 mg, 10-40 mg, Intravenous, Q4H PRN, Desai, Rahul P, PA-C, 20 mg at 06/13/19 0420   isosorbide mononitrate (IMDUR) 24 hr tablet 30 mg, 30 mg, Oral, Daily, Rosalin Hawking, MD, 30 mg at 06/14/19 0919   MEDLINE mouth rinse, 15 mL, Mouth Rinse, q12n4p, Rosalin Hawking, MD, 15 mL at 06/14/19 1117   oxyCODONE-acetaminophen (PERCOCET/ROXICET) 5-325 MG per tablet 1 tablet, 1 tablet, Oral, Q6H PRN, Mannam, Praveen, MD, 1 tablet at 06/11/19 2102   pantoprazole (PROTONIX) EC tablet 40 mg, 40 mg, Oral, Daily, Rosalin Hawking, MD, 40 mg at 06/14/19 [id number]   polyethylene glycol (MIRALAX / GLYCOLAX) packet 17 g, 17 g, Oral, Daily, Donne Hazel, MD, 17 g at 06/13/19 [id number]   vitamin B-12 (CYANOCOBALAMIN) tablet 100 mcg, 100 mcg, Oral, Daily, Rosalin Hawking, MD, 100 mcg at 06/14/19 0919   Patients Current Diet:     Diet Order                      Diet - low sodium heart healthy           DIET DYS 2 Room service appropriate? Yes with Assist; Fluid consistency: Thin  Diet effective now                   Precautions / Restrictions Precautions Precautions: Fall, Other (comment) Precaution Comments: R hemiparesis, check orthostatics Restrictions Weight Bearing Restrictions: No      Has the patient had 2 or more falls or a fall with injury in the past year?Yes   Prior Activity Level Community (5-7x/wk): active and independent prior to admission. 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 does not drive due to low vision.    Prior Functional Level Prior Function Level of Independence: Independent Comments: 80% blind, does not drive. Manages own medications with intermittent assist from granddaughter for set up, cooks, cleans. Enjoys wood working, Control and instrumentation engineer.     Self Care: Did the patient need help bathing, dressing, using the toilet or eating?  Independent   Indoor Mobility: Did the patient need assistance with walking from room to room (with or without device)? Independent   Stairs: Did the patient need assistance with internal  or external stairs (with or without device)? Independent   Functional Cognition: Did the patient need help planning regular tasks such as shopping or remembering to take medications? Independent   Home Assistive Devices / Equipment   Prior Device Use: Indicate devices/aids used by the patient prior to current illness, exacerbation or injury? None of the above   Current Functional Level Cognition   Overall Cognitive Status: Impaired/Different from baseline Difficult to assess due to: Impaired communication Current Attention Level: Focused, Sustained Orientation Level: Oriented X4 Following Commands: Follows one step commands inconsistently Safety/Judgement: Decreased awareness of safety, Decreased awareness of deficits General Comments: pt asking to call family and reports to family that he is in a room with no clothes and 65 degrees. therapist adjusting heat in room closing door. pt state "no i dont want it hot. yall arent listening"    Extremity Assessment (includes Sensation/Coordination)   Upper Extremity Assessment: RUE deficits/detail RUE Deficits / Details: noted to have thumb and 2nd digit activation. when hand placed on bar  attempting to make a fist around bar. pt unable to complete with command but initiated with functional task RUE Sensation: decreased light touch RUE Coordination: decreased fine motor, decreased gross motor  Lower Extremity Assessment: RLE deficits/detail RLE Deficits / Details: No active movement noted     ADLs   Overall ADL's : Needs assistance/impaired Eating/Feeding: Minimal assistance Grooming: Moderate assistance, Sitting(stedy) Grooming Details (indicate cue type and reason): pt static sitting in stedy at sink with strong R lean. Pt requires everything setup to allow L Ue use for oral care Upper Body Bathing: Moderate assistance, Sitting Lower Body Bathing: Total assistance, Maximal assistance, Bed level Upper Body Dressing : Minimal assistance Lower Body Dressing: Total assistance Lower Body Dressing Details (indicate cue type and reason): pt reports "i didnt do that before all this" General ADL Comments: pt progressed from supine to static standing in stedy. pt with R lean due to R side weakness but with cues able to initiate alignment     Mobility   Overal bed mobility: Needs Assistance Bed Mobility: Rolling, Sidelying to Sit Rolling: Mod assist, +2 for physical assistance Sidelying to sit: Mod assist, +2 for physical assistance Supine to sit: +2 for physical assistance, Mod assist Sit to supine: +2 for physical assistance, Max assist General bed mobility comments: Pt required cues for hand and foot placement to roll to R side of bed.  He was able to reach for railing with L hand and required assistance to advance [name] to edge of bed.  Once in this position he required min +2 to elevate trunk into sitting and mod +2 to maintain sitting.  Pt bracing with L hand on foot board with posterior lean.  Able to correct with cueing and facilitation.     Transfers   Overall transfer level: Needs assistance Equipment used: Ambulation equipment used(stedy) Transfer via Lift Equipment:  Stedy Transfers: Sit to/from Stand Sit to Stand: Mod assist, +2 physical assistance General transfer comment: Pt able to power up with less deviation to the R.  Listing remains to the right but not as prominent as it has been in previous session.  Focused on sit to stand reps x 5 from stedy plates and able to move RLE toward extension.     Ambulation / Gait / Stairs / Wheelchair Mobility   Ambulation/Gait Ambulation/Gait assistance: (NT)     Posture / Balance Dynamic Sitting Balance Sitting balance - Comments: zero to poor when fatigued unable to maintain  sitting without adequate support. Balance Overall balance assessment: Needs assistance Sitting-balance support: Bilateral upper extremity supported, Feet supported Sitting balance-Leahy Scale: Poor Sitting balance - Comments: zero to poor when fatigued unable to maintain sitting without adequate support. Postural control: Right lateral lean Standing balance-Leahy Scale: Poor Standing balance comment: R lateral lean.  Required facilitation to correct position.     Special needs/care consideration BiPAP/CPAP: no CPM: no Continuous Drip IV: no Dialysis: no        Days: no Life Vest: no Oxygen:  Special Bed: no Trach Size: no Wound Vac (area): no      Location: no Skin: abrasion to posterior head, ecchymosis to right/left arm.                           Bowel mgmt: last BM 06/14/19 Bladder mgmt: incontinent, external catheter in place Diabetic mgmt: no Behavioral consideration : no Chemo/radiation : no        Previous Home Environment (from acute therapy documentation) Living Arrangements: Other relatives  Lives With: Spouse Available Help at Discharge: Family Type of Home: House Home Layout: Able to live on main level with bedroom/bathroom Home Access: Other (comment)(threshold) Bathroom Shower/Tub: Tub/shower unit Additional Comments: Granddaughter goes to college T-Sa 10-2; states there are other family members who are  offering to provide 24/7 i.e. sister lives down street, ex wife, daughters   Discharge Living Setting Plans for Discharge Living Setting: House, Lives with (comment) Type of Home at Discharge: House(granddaughter [name]) Discharge Home Layout: Two level, Able to live on main level with bedroom/bathroom Alternate Level Stairs-Rails: None Alternate Level Stairs-Number of Steps: NA -2nd story only for storage Discharge Home Access: Stairs to enter Entrance Stairs-Rails: None Entrance Stairs-Number of Steps: two Discharge Bathroom Shower/Tub: Tub/shower unit, Walk-in shower Discharge Bathroom Toilet: Standard Discharge Bathroom Accessibility: Yes How Accessible: Accessible via walker Does the patient have any problems obtaining your medications?: No   Social/Family/Support Systems Patient Roles: Other (Comment)(retired Art gallery manager, 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.does woodworking) Sport and exercise psychologist Information: granddaughter [name]: cell: [phone] Anticipated Caregiver: [name]  Anticipated Caregiver's Contact Information: see above Ability/Limitations of Caregiver: Min A Caregiver Availability: 24/7(either [name] will take FMLA or will have hired caregiver) Discharge Plan Discussed with Primary Caregiver: Yes Is Caregiver In Agreement with Plan?: Yes Does Caregiver/Family have Issues with Lodging/Transportation while Pt is in Rehab?: No     Goals/Additional Needs Patient/Family Goal for Rehab: PT/OT: Min A; SLP: Supervision Expected length of stay: 18-22 days Cultural Considerations: NA Dietary Needs: DYS 2, thin liquids; room service with assist.  Equipment Needs: TBD Pt/Family Agrees to Admission and willing to participate: Yes Program Orientation Provided & Reviewed with Pt/Caregiver Including Roles  & Responsibilities: Yes(pt and his granddaughter)  Barriers to Discharge: Home environment access/layout, Lack of/limited family support  Barriers to Discharge Comments: steps to enter home;  will need to be 1 person assist.      Decrease burden of Care through IP rehab admission: NA     Possible need for SNF placement upon discharge:Not anticipated has has pt good social support from his granddaughter who has a flexibility school schedule, has offered to take FMLA/vs leave of absence from her program, or have family/friend present to assist at DC. Pt has an excellent prognosis for further progress through CIR.      Patient Condition: This patient's medical and functional status has changed since the consult dated: 06/08/19 in which the Rehabilitation Physician determined and documented that the patient's  condition is appropriate for intensive rehabilitative care in an inpatient rehabilitation facility. See "History of Present Illness" (above) for medical update. Functional changes are: improvement in bed mobility from Max A +2 to Mod A +2, improvement in transfers from Mod/Max A + 2 to Mod A +2. Patient's medical and functional status update has been discussed with the Rehabilitation physician and patient remains appropriate for inpatient rehabilitation. Will admit to inpatient rehab today.   Preadmission Screen Completed By:  Raechel Ache, OT, 06/14/2019 3:27 PM ______________________________________________________________________   Discussed status with Dr. Ranell Patrick on 06/14/19 at 3:27PM and received approval for admission today.   Admission Coordinator:  Raechel Ache, time 3:27PM/Date 06/14/19         Cosigned by: Izora Ribas, MD at 06/14/2019  3:33 PM  Revision History

## 2019-06-15 ENCOUNTER — Inpatient Hospital Stay (HOSPITAL_COMMUNITY): Payer: Medicare Other | Admitting: Speech Pathology

## 2019-06-15 ENCOUNTER — Inpatient Hospital Stay (HOSPITAL_COMMUNITY): Payer: Medicare Other | Admitting: Occupational Therapy

## 2019-06-15 ENCOUNTER — Inpatient Hospital Stay (HOSPITAL_COMMUNITY): Payer: Medicare Other

## 2019-06-15 ENCOUNTER — Inpatient Hospital Stay (HOSPITAL_COMMUNITY): Payer: Medicare Other | Admitting: Physical Therapy

## 2019-06-15 ENCOUNTER — Telehealth: Payer: Self-pay | Admitting: *Deleted

## 2019-06-15 DIAGNOSIS — I6310 Cerebral infarction due to embolism of unspecified precerebral artery: Secondary | ICD-10-CM

## 2019-06-15 DIAGNOSIS — I639 Cerebral infarction, unspecified: Secondary | ICD-10-CM

## 2019-06-15 LAB — COMPREHENSIVE METABOLIC PANEL
ALT: 21 U/L (ref 0–44)
AST: 20 U/L (ref 15–41)
Albumin: 2.9 g/dL — ABNORMAL LOW (ref 3.5–5.0)
Alkaline Phosphatase: 42 U/L (ref 38–126)
Anion gap: 14 (ref 5–15)
BUN: 14 mg/dL (ref 8–23)
CO2: 23 mmol/L (ref 22–32)
Calcium: 8.8 mg/dL — ABNORMAL LOW (ref 8.9–10.3)
Chloride: 99 mmol/L (ref 98–111)
Creatinine, Ser: 1.08 mg/dL (ref 0.61–1.24)
GFR calc Af Amer: >60 mL/min (ref >60)
GFR calc non Af Amer: >60 mL/min (ref >60)
Glucose, Bld: 140 mg/dL — ABNORMAL HIGH (ref 70–99)
Potassium: 3.8 mmol/L (ref 3.5–5.1)
Sodium: 136 mmol/L (ref 135–145)
Total Bilirubin: 2.3 mg/dL — ABNORMAL HIGH (ref 0.3–1.2)
Total Protein: 5.6 g/dL — ABNORMAL LOW (ref 6.5–8.1)

## 2019-06-15 LAB — CBC WITH DIFFERENTIAL/PLATELET
Abs Immature Granulocytes: 0.04 10*3/uL (ref 0.00–0.07)
Basophils Absolute: 0.1 10*3/uL (ref 0.0–0.1)
Basophils Relative: 1 %
Eosinophils Absolute: 0.4 10*3/uL (ref 0.0–0.5)
Eosinophils Relative: 5 %
HCT: 39.1 % (ref 39.0–52.0)
Hemoglobin: 13 g/dL (ref 13.0–17.0)
Immature Granulocytes: 1 %
Lymphocytes Relative: 13 %
Lymphs Abs: 1.1 10*3/uL (ref 0.7–4.0)
MCH: 28.8 pg (ref 26.0–34.0)
MCHC: 33.2 g/dL (ref 30.0–36.0)
MCV: 86.7 fL (ref 80.0–100.0)
Monocytes Absolute: 0.8 10*3/uL (ref 0.1–1.0)
Monocytes Relative: 9 %
Neutro Abs: 6.2 10*3/uL (ref 1.7–7.7)
Neutrophils Relative %: 71 %
Platelets: 183 10*3/uL (ref 150–400)
RBC: 4.51 MIL/uL (ref 4.22–5.81)
RDW: 14.9 % (ref 11.5–15.5)
WBC: 8.6 10*3/uL (ref 4.0–10.5)
nRBC: 0 % (ref 0.0–0.2)

## 2019-06-15 MED ORDER — ACETAMINOPHEN 325 MG PO TABS: 325.0000 mg | ORAL_TABLET | ORAL | Status: DC | PRN

## 2019-06-15 NOTE — Progress Notes (Signed)
Lower extremity venous has been completed.   Preliminary results in CV Proc.   Abram Sander 06/15/2019 3:22 PM

## 2019-06-15 NOTE — Evaluation (Signed)
Occupational Therapy Assessment and Plan  Patient Details  Name: Mark Clayton MRN: [id number] Date of Birth: 09-28-1940  OT Diagnosis: abnormal posture, cognitive deficits, hemiplegia affecting dominant side, muscle weakness (generalized) and coordination disorder Rehab Potential: Rehab Potential (ACUTE ONLY): Good ELOS: 21-24 days   Today's Date: 06/15/2019 OT Individual Time: 1300-1400 OT Individual Time Calculation (min): 60 min     Problem List:  Patient Active Problem List   Diagnosis Date Noted  . Embolic stroke ([place]) 74/82/7078  . ICH (intracerebral hemorrhage) ([place]) 06/06/2019  . Dysarthria   . Tinnitus 08/26/2018  . Essential hypertension 01/21/2018  . Coronary artery disease involving native heart with angina pectoris ([place]) 10/19/2017  . Persistent atrial fibrillation   . Routine general medical examination at a health care facility 01/10/2016  . Unstable angina ([place]) 09/20/2014  . Ischemic cardiomyopathy   . Personal history of colon cancer   . Implantable cardioverter-defibrillator (ICD) in situ 03/09/2012  . Microcytic anemia 01/12/2011  . Cardiac defibrillator  MDT VVI 12/26/2010  . Chronic systolic heart failure ([place]) 11/26/2008    Past Medical History:  Past Medical History:  Diagnosis Date  . AICD (automatic cardioverter/defibrillator) present 01/17/2003   Medtronic Maximo 7232CX ICD, serial [id number]  . Anemia 02-06-11   takes oral iron  . Arthritis    hands, knees  . CAD (coronary artery disease) 2003   a. h/o MI and CABG in 2003. b. s/p DES to SVG-RPDA-RPLB in 08/2014.  Marland Kitchen Cancer of sigmoid colon ([place]) 2012   a. s/p colon surgery.  . Carotid bruit   . Chronic systolic CHF (congestive heart failure) (HCC)    a. EF 20% in 2014; b. 08/2017 Echo: EF 20-25%, diff HK, Gr3 DD. Triv AI. Mod MR. Sev dil LA. Mildly dil RV w/ mildly reduced RV fxn. Mildly dil RA. Mod TR. PASP 1mg.  .Marland KitchenCough   . GERD (gastroesophageal reflux disease) 02-06-11  . HTN  (hypertension)   . Hyperlipidemia   . Ischemic cardiomyopathy    a. EF 20% in 2014. (Master study EF >20%); b. 08/2017 Echo: EF 20-25%, diff HK. Gr3 DD.  . LV (left ventricular) mural thrombus    a. 12/2012 Echo: EF 20% with mural thrombus No evidence of thrombus on 08/2017 echo.  . Macular degeneration   . Myocardial infarct ([place]    2003  . PAF (paroxysmal atrial fibrillation) (HCC)    a. CHA2DS2VASc = 5-->Xarelto/Tikosyn.   Past Surgical History:  Past Surgical History:  Procedure Laterality Date  . CARDIAC CATHETERIZATION N/A 09/20/2014   Procedure: Left Heart Cath and Coronary Angiography;  Surgeon: JJettie Booze MD; LAD 95%, D1 100%, CFX liner percent, OM 200%, OM 390%, RCA 90%, LIMA-LAD okay, SVG-OM 2-OM 3 minimal disease, SVG-RPDA-RPLB 100% between the RPDA and RPL     . CARDIAC CATHETERIZATION N/A 09/20/2014   Procedure: Coronary Stent Intervention;  Surgeon: JJettie Booze MD; Synergy DES 4 x 24 mm reducing the stenosis to 5%   . CARDIAC DEFIBRILLATOR PLACEMENT  01/17/03   6949 lead. Medtronic. remote-no; with later revision  . CARDIOVERSION N/A 05/22/2016   Procedure: CARDIOVERSION;  Surgeon: BLelon Perla MD;  [facility];  Service: Cardiovascular;  Laterality: N/A;  . CARDIOVERSION N/A 08/10/2017   Procedure: CARDIOVERSION;  Surgeon: HPixie Casino MD;  [facility];  Service: Cardiovascular;  Laterality: N/A;  . CATARACT EXTRACTION W/ INTRAOCULAR LENS  IMPLANT, BILATERAL Bilateral June/-July 2009   Dr. GKaty Fitch . COLON RESECTION  02/09/2011  Procedure: LAPAROSCOPIC SIGMOID COLON RESECTION;  Surgeon: Pedro Earls, MD;  Location: [facility];  Service: General;  Laterality: N/A;  Laparoscopic Assisted Sigmoid Colectomy  . COLON SURGERY    . COLONOSCOPY  08/31/2011   Procedure: COLONOSCOPY;  Surgeon: Jerene Bears, MD;  [facility]: [facility];  Service: Gastroenterology;  Laterality: N/A;  . COLONOSCOPY N/A 09/05/2012   Procedure: COLONOSCOPY;   Surgeon: Jerene Bears, MD;  [facility];  Service: Gastroenterology;  Laterality: N/A;  . COLONOSCOPY N/A 04/18/2013   Procedure: COLONOSCOPY;  Surgeon: Jerene Bears, MD;  [facility];  Service: Gastroenterology;  Laterality: N/A;  . COLONOSCOPY N/A 04/09/2014   Procedure: COLONOSCOPY;  Surgeon: Jerene Bears, MD;  Location: [facility];  Service: Gastroenterology;  Laterality: N/A;  . COLONOSCOPY WITH PROPOFOL N/A 05/03/2017   Procedure: COLONOSCOPY WITH PROPOFOL;  Surgeon: Jerene Bears, MD;  [facility]: [facility];  Service: Gastroenterology;  Laterality: N/A;  . CORONARY ANGIOPLASTY    . CORONARY ARTERY BYPASS GRAFT  01/2002   LIMA-LAD, SVG-OM 2-OM 3, SVG-RPDA-RPLB  . CORONARY STENT INTERVENTION N/A 11/22/2018   Procedure: CORONARY STENT INTERVENTION;  Surgeon: Nelva Bush, MD;  Location: [facility];  Service: Cardiovascular;  Laterality: N/A;  SVG - RCA  . ICD GENERATOR CHANGE  2010   Medtronic Virtuoso II VR ICD  . ICD GENERATOR CHANGEOUT N/A 12/07/2018   Procedure: ICD GENERATOR CHANGEOUT;  Surgeon: Deboraha Sprang, MD;  Location: [place];  Service: Cardiovascular;  Laterality: N/A;  . INGUINAL HERNIA REPAIR Right 2000's X 2  . LAPAROSCOPIC RIGHT HEMI COLECTOMY N/A 11/04/2012   Procedure: LAPAROSCOPIC RIGHT HEMI COLECTOMY;  Surgeon: Pedro Earls, MD;  Location: [facility];  Service: General;  Laterality: N/A;  . LEFT HEART CATH AND CORS/GRAFTS ANGIOGRAPHY Left 11/22/2018   Procedure: LEFT HEART CATH AND CORS/GRAFTS ANGIOGRAPHY;  Surgeon: Nelva Bush, MD;  Location: [facility];  Service: Cardiovascular;  Laterality: Left;  . TONSILLECTOMY  ~ 1950    Assessment & Plan Clinical Impression: Patient is a 79 y.o. year old male with history of colon cancer, Chronic systolic CHF with AICD, PAF, anemia, macular degeneration--legally blind, LV thrombus in the past who was admitted on  06/06/19 for work-up after falling backwards in the yard,  striking his head and developing left-sided weakness.  CT head showed left hemispheric SDH therefore Xarelto was reversed with Kcentra.  CTA/perfusion of head/neck showed branch occlusion left M3 with trickle flow corresponding acute infarct left parietal lobe with surrounding penumbra.  Neurology felt left-MCA infarct was embolic due to A. fib and no antithrombotic due to hemorrhage.  Dr. Trenton Gammon recommended conservative management with serial CT head for monitoring.  BP was elevated at admission and has been treated with Cleviprex.  2D echo showed global hypokinesis with EF of 20%, aortic sclerosis without stenosis and moderately elevated pulmonary pressures.    Follow-up CT head showed stable bleed with development of left frontoparietal infarct.  Dr. Erlinda Hong felt that patien tiwth embolic stroke due to AF and severe cardiomyopathy--SDH due to fall with stroke. Currently off antithrombotics and no AC due to SDH---To consider resuming AC in 2-3 weeks if SDH resolving and clinically stable.  Patient reported chocking on foods and MBS done revealing oropharyngeal dysphagia and residue--to continue dysphagia 2, thins with multiple hard swallows and throat clears.  He developed pre-renal azotemia and was treated with IVF X 24 hours.  He developed cough with upward trend in weight by  6 lbs and continues on IV lasix for diuresis for acute on chronic CHF--to transition back to oral when back to baseline weight of 74 kg. Therapy has been ongoing and patient limited by dysarthria with expressive>receptive aphasia, paraphasias, right hemiparesis. CIR recommended due to functional decline.   .  Patient transferred to CIR on 06/14/2019 .    Patient currently requires max - total A with basic self-care skills secondary to muscle weakness, decreased cardiorespiratoy endurance, abnormal tone, unbalanced muscle activation, ataxia, decreased coordination and decreased motor planning, decreased midline orientation, decreased  awareness, decreased problem solving and decreased safety awareness and decreased sitting balance, decreased standing balance, decreased postural control, hemiplegia and decreased balance strategies.  Prior to hospitalization, patient could complete ADLs and IADLs with independent .  Patient will benefit from skilled intervention to decrease level of assist with basic self-care skills prior to discharge home with care partner.  Anticipate patient will require 24 hour supervision and minimal physical assistance and follow up home health.  OT - End of Session Activity Tolerance: Decreased this session Endurance Deficit: Yes Endurance Deficit Description: multiple rest breaks secondary to fatigue OT Assessment Rehab Potential (ACUTE ONLY): Good OT Barriers to Discharge: Other (comments) OT Barriers to Discharge Comments: none known at this time OT Patient demonstrates impairments in the following area(s): Balance;Behavior;Endurance;Cognition;Motor;Pain;Safety OT Basic ADL's Functional Problem(s): Eating;Grooming;Bathing;Dressing;Toileting OT Transfers Functional Problem(s): Toilet;Tub/Shower OT Additional Impairment(s): Fuctional Use of Upper Extremity OT Plan OT Intensity: Minimum of 1-2 x/day, 45 to 90 minutes OT Frequency: 5 out of 7 days OT Duration/Estimated Length of Stay: 21-24 days OT Treatment/Interventions: Balance/vestibular training;Self Care/advanced ADL retraining;UE/LE Coordination activities;Cognitive remediation/compensation;Functional mobility training;Community reintegration;Neuromuscular re-education;Splinting/orthotics;Wheelchair propulsion/positioning;Discharge planning;Pain management;Therapeutic Activities;Patient/family education;Therapeutic Exercise;DME/adaptive equipment instruction;Psychosocial support OT Self Feeding Anticipated Outcome(s): set up A OT Basic Self-Care Anticipated Outcome(s): S - min A OT Toileting Anticipated Outcome(s): min A OT Bathroom Transfers  Anticipated Outcome(s): min A OT Recommendation Recommendations for Other Services: Neuropsych consult;Therapeutic Recreation consult Therapeutic Recreation Interventions: [place] group Patient destination: Home Follow Up Recommendations: Home health OT;24 hour supervision/assistance Equipment Recommended: To be determined   Skilled Therapeutic Intervention Upon entering the room, pt supine in bed with no c/o pain. Pt very upset over new full supervision for liquids. OT provided education for safety concerns with swallowing at this time. OT redirecting pt with mod multimodal cuing. Pt performed supine >sit with max A overall. Static sitting balance with min A. However, pt does have posterior LOB requiring mod - max A to correct. Max A sit <>stand from EOB. Pt very anxious about mobility tasks. Pt with significant sensation deficits in R UE as well. LB clothing management with total A to thread and pull over B hips with max A standing balance. Sit >supine with max A back to bed. OT positioned pt to protect hemiplegic side. Bed alarm activated and call bell within reach.   OT Evaluation Precautions/Restrictions  Precautions Precautions: Fall Precaution Comments: R hemiparesis Restrictions Weight Bearing Restrictions: No Vital Signs Therapy Vitals Temp: 97.7 F (36.5 C) Temp Source: Oral Pulse Rate: 86 Resp: 18 BP: 103/68 Patient Position (if appropriate): Sitting Oxygen Therapy SpO2: 100 % O2 Device: Room Air Pain Pain Assessment Pain Scale: 0-10 Pain Score: 0-No pain Home Living/Prior Functioning Home Living Family/patient expects to be discharged to:: Private residence Living Arrangements: Alone, Children Available Help at Discharge: Family, Available 24 hours/day Type of Home: House Home Access: Other (comment)(threshold) Home Layout: Able to live on main level with bedroom/bathroom Bathroom Shower/Tub: Tub/shower unit Additional  Comments: Granddaughter goes to college  T-Sa 10-2; states there are other family members who are offering to provide 24/7 i.e. sister lives down street, ex wife, daughters  Lives With: Family Prior Function Level of Independence: Independent with basic ADLs, Independent with homemaking with ambulation, Independent with gait, Independent with transfers Comments: 80% blind, does not drive. Manages own medications with intermittent assist from granddaughter for set up, cooks, cleans. Enjoys wood working, Control and instrumentation engineer.   Vision Baseline Vision/History: Macular Degeneration(family reports 80% blind) Patient Visual Report: No change from baseline Cognition Overall Cognitive Status: Impaired/Different from baseline Arousal/Alertness: Awake/alert Orientation Level: Person;Place;Situation Person: Oriented Place: Oriented Situation: Oriented Year: 2021 Month: March Day of Week: Correct Immediate Memory Recall: Sock;Blue;Bed Memory Recall Sock: Without Cue Memory Recall Blue: With Cue Memory Recall Bed: With Cue Attention: Sustained Sustained Attention: Impaired Sustained Attention Impairment: Verbal basic Awareness: Impaired Awareness Impairment: Anticipatory impairment Problem Solving: Appears intact Safety/Judgment: Appears intact Sensation Sensation Light Touch: Impaired Detail Light Touch Impaired Details: Impaired RUE Hot/Cold: Impaired by gross assessment Proprioception: Impaired by gross assessment Stereognosis: Not tested Coordination Gross Motor Movements are Fluid and Coordinated: No Fine Motor Movements are Fluid and Coordinated: No Motor  Motor Motor: Hemiplegia;Abnormal tone Mobility  Bed Mobility Bed Mobility: Rolling Right;Sit to Supine;Rolling Left;Supine to Sit Rolling Right: Moderate Assistance - Patient 50-74% Rolling Left: Moderate Assistance - Patient 50-74% Supine to Sit: Maximal Assistance - Patient - Patient 25-49% Sit to Supine: Maximal Assistance - Patient 25-49% Transfers Sit to Stand: Maximal  Assistance - Patient 25-49%  Trunk/Postural Assessment  Cervical Assessment Cervical Assessment: Exceptions to [facility] Thoracic Assessment Thoracic Assessment: Exceptions to [facility] Lumbar Assessment Lumbar Assessment: Exceptions to [facility] Postural Control Postural Control: Deficits on evaluation Righting Reactions: lateral lean R Protective Responses: decreased on the R Postural Limitations: poor control with lateral reach  Balance Balance Balance Assessed: Yes Static Sitting Balance Static Sitting - Level of Assistance: 4: Min assist Dynamic Sitting Balance Dynamic Sitting - Level of Assistance: 2: Max assist Sitting balance - Comments: poor when fatigued unable to maintain sitting without adequate support. Static Standing Balance Static Standing - Level of Assistance: 2: Max assist Dynamic Standing Balance Dynamic Standing - Balance Support: Left upper extremity supported Dynamic Standing - Level of Assistance: 2: Max assist;1: +1 Total assist Extremity/Trunk Assessment RUE Assessment RUE Assessment: Exceptions to [facility] Passive Range of Motion (PROM) Comments: WFLs Active Range of Motion (AROM) Comments: digits and wrist 3-/5, 1/5 elbow and no activation in shoulder LUE Assessment LUE Assessment: Within Functional Limits     Refer to Care Plan for Long Term Goals  Recommendations for other services: Neuropsych and Therapeutic Recreation  Kitchen group   Discharge Criteria: Patient will be discharged from OT if patient refuses treatment 3 consecutive times without medical reason, if treatment goals not met, if there is a change in medical status, if patient makes no progress towards goals or if patient is discharged from hospital.  The above assessment, treatment plan, treatment alternatives and goals were discussed and mutually agreed upon: by patient  Gypsy Decant 06/15/2019, 3:09 PM

## 2019-06-15 NOTE — Telephone Encounter (Signed)
Pt was on TCM report he presented to [facility] ED after falling backwards while outside doing yardwork and washing his truck. He struck his head on a concrete driveway and lost capability to move his right side. Pt admitted 06/06/19 by PCCM for code stroke. CT head demonstrated extra axial hemorrhage on the left. CTA head / neck and CTP pending. MRI deferred due to AICD. Pt D/C 06/14/19 to inpatient rehab. Per summary will need to f/u w/PCP 1/2 week after leaving rehab./lmb

## 2019-06-15 NOTE — Progress Notes (Signed)
Inpatient Rehabilitation Medication Review by a Pharmacist  A complete drug regimen review was completed for this patient to identify any potential clinically significant medication issues.  Clinically significant medication issues were identified:  no  Pharmacist comments: No issues noted.  Time spent performing this drug regimen review (minutes):  5  Sherren Kerns, PharmD PGY1 Acute Care Pharmacy Resident 06/15/2019 6:05 PM

## 2019-06-15 NOTE — Progress Notes (Signed)
[facility] PHYSICAL MEDICINE & REHABILITATION PROGRESS NOTE   Subjective/Complaints:  Choked on eggs this am , roommate alerted nurse, no breathing issues  ROS-neg CP, SOB, N/V?D  Objective:   No results found. No results for input(s): WBC, HGB, HCT, PLT in the last 72 hours. Recent Labs    06/13/19 0356 06/14/19 0400  NA 139 138  K 3.2* 4.0  CL 102 103  CO2 24 25  GLUCOSE 132* 126*  BUN 17 17  CREATININE 1.04 1.06  CALCIUM 8.7* 8.6*    Intake/Output Summary (Last 24 hours) at 06/15/2019 0810 Last data filed at 06/15/2019 0448 Gross per 24 hour  Intake 100 ml  Output 975 ml  Net -875 ml     Physical Exam: Vital Signs Blood pressure (!) 148/80, pulse 94, temperature 98.1 F (36.7 C), temperature source Oral, resp. rate 18, height 6' (1.829 m), weight 70.8 kg, SpO2 98 %.     Assessment/Plan: 1. Functional deficits secondary to CVA L MCA and subdural  which require 3+ hours per day of interdisciplinary therapy in a comprehensive inpatient rehab setting.  Physiatrist is providing close team supervision and 24 hour management of active medical problems listed below.  Physiatrist and rehab team continue to assess barriers to discharge/monitor patient progress toward functional and medical goals  Care Tool:  Bathing              Bathing assist       Upper Body Dressing/Undressing Upper body dressing   What is the patient wearing?: Hospital gown only    Upper body assist      Lower Body Dressing/Undressing Lower body dressing      What is the patient wearing?: Hospital gown only     Lower body assist       Toileting Toileting    Toileting assist Assist for toileting: Moderate Assistance - Patient 50 - 74%     Transfers Chair/bed transfer  Transfers assist           Locomotion Ambulation   Ambulation assist              Walk 10 feet activity   Assist           Walk 50 feet activity   Assist           Walk  150 feet activity   Assist           Walk 10 feet on uneven surface  activity   Assist           Wheelchair     Assist               Wheelchair 50 feet with 2 turns activity    Assist            Wheelchair 150 feet activity     Assist          Blood pressure (!) 148/80, pulse 94, temperature 98.1 F (36.7 C), temperature source Oral, resp. rate 18, height 6' (1.829 m), weight 70.8 kg, SpO2 98 %.  Medical Problem List and Plan: 1.  Left parafalcine subdural hematoma and subarachnoid hemorrhage, Left MCA M3 branch embolic stroke  CIR evals today              -patient may shower             -ELOS/Goals: 2-3 weeks 2.  Antithrombotics: -DVT/anticoagulation:  Mechanical: Sequential compression devices, below knee Bilateral lower extremities--Follow up CT head in 2  weeks to decide on resuming AC.              -antiplatelet therapy: N/A 3. Pain Management: N/a 4. Mood: LCSW to follow for evaluation and support.              -antipsychotic agents: N/A 5. Neuropsych: This patient is capable of making decisions on his own behalf. 6. Skin/Wound Care: Routine pressure relief measures.  7. Fluids/Electrolytes/Nutrition: Monitor I/O. Check lytes in am. Continue Vitamin B12 supplement 8. Acute on chronic CHF: On Coreg , lipitor, imdur and Lasix.  Monitor for signs of overload--monitor weights daily. Continue Lasix IV bid. Needs to get back to 174 lbs 9. CAD/CAF/ s/p AICD: Continues imdur, Coreg and lipitor. Monitor HR tid.  10. Hypokalemia: Improved post supplement --likely due to increase in diuretics. Will add supplement.  11. ABLA: Will recheck in am.  12. Macular degeneration: Legally blind.   13. Insomnia: Trazodone 25mg  HS PRN  14.  Dysphagia-choked wit heggs this am will ask SLP to re eval diet  LOS: 1 days A FACE TO [place] E Sabiha Sura 06/15/2019, 8:10 AM

## 2019-06-15 NOTE — Evaluation (Signed)
Speech Language Pathology Assessment and Plan  Patient Details  Name: Mark Clayton MRN: [id number] Date of Birth: 1940-08-07  SLP Diagnosis: Aphasia;Dysarthria;Cognitive Impairments;Dysphagia  Rehab Potential: Good ELOS: 3-4 weeks    Today's Date: 06/15/2019 SLP Individual Time: 1100-1200 SLP Individual Time Calculation (min): 60 min   Problem List:  Patient Active Problem List   Diagnosis Date Noted  . Embolic stroke ([place]) 27/25/3664  . ICH (intracerebral hemorrhage) ([place]) 06/06/2019  . Dysarthria   . Tinnitus 08/26/2018  . Essential hypertension 01/21/2018  . Coronary artery disease involving native heart with angina pectoris ([place]) 10/19/2017  . Persistent atrial fibrillation   . Routine general medical examination at a health care facility 01/10/2016  . Unstable angina ([place]) 09/20/2014  . Ischemic cardiomyopathy   . Personal history of colon cancer   . Implantable cardioverter-defibrillator (ICD) in situ 03/09/2012  . Microcytic anemia 01/12/2011  . Cardiac defibrillator  MDT VVI 12/26/2010  . Chronic systolic heart failure ([place]) 11/26/2008   Past Medical History:  Past Medical History:  Diagnosis Date  . AICD (automatic cardioverter/defibrillator) present 01/17/2003   Medtronic Maximo 7232CX ICD, serial [id number]  . Anemia 02-06-11   takes oral iron  . Arthritis    hands, knees  . CAD (coronary artery disease) 2003   a. h/o MI and CABG in 2003. b. s/p DES to SVG-RPDA-RPLB in 08/2014.  Marland Kitchen Cancer of sigmoid colon ([place]) 2012   a. s/p colon surgery.  . Carotid bruit   . Chronic systolic CHF (congestive heart failure) (HCC)    a. EF 20% in 2014; b. 08/2017 Echo: EF 20-25%, diff HK, Gr3 DD. Triv AI. Mod MR. Sev dil LA. Mildly dil RV w/ mildly reduced RV fxn. Mildly dil RA. Mod TR. PASP 17mg.  .Marland KitchenCough   . GERD (gastroesophageal reflux disease) 02-06-11  . HTN (hypertension)   . Hyperlipidemia   . Ischemic cardiomyopathy    a. EF 20% in 2014. (Master study EF >20%);  b. 08/2017 Echo: EF 20-25%, diff HK. Gr3 DD.  . LV (left ventricular) mural thrombus    a. 12/2012 Echo: EF 20% with mural thrombus No evidence of thrombus on 08/2017 echo.  . Macular degeneration   . [place]    [place]  . PAF (paroxysmal atrial fibrillation) (HCC)    a. CHA2DS2VASc = 5-->Xarelto/Tikosyn.   Past Surgical History:  Past Surgical History:  Procedure Laterality Date  . CARDIAC CATHETERIZATION N/A 09/20/2014   Procedure: Left Heart Cath and Coronary Angiography;  Surgeon: JJettie Booze MD; LAD 95%, D1 100%, CFX liner percent, OM 200%, OM 390%, RCA 90%, LIMA-LAD okay, SVG-OM 2-OM 3 minimal disease, SVG-RPDA-RPLB 100% between the RPDA and RPL     . CARDIAC CATHETERIZATION N/A 09/20/2014   Procedure: Coronary Stent Intervention;  Surgeon: JJettie Booze MD; Synergy DES 4 x 24 mm reducing the stenosis to 5%   . CARDIAC DEFIBRILLATOR PLACEMENT  01/17/03   6949 lead. Medtronic. remote-no; with later revision  . CARDIOVERSION N/A 05/22/2016   Procedure: CARDIOVERSION;  Surgeon: BLelon Perla MD;  [facility];  Service: Cardiovascular;  Laterality: N/A;  . CARDIOVERSION N/A 08/10/2017   Procedure: CARDIOVERSION;  Surgeon: HPixie Casino MD;  [facility];  Service: Cardiovascular;  Laterality: N/A;  . CATARACT EXTRACTION W/ INTRAOCULAR LENS  IMPLANT, BILATERAL Bilateral June/-July 2009   Dr. GKaty Fitch . COLON RESECTION  02/09/2011   Procedure: LAPAROSCOPIC SIGMOID COLON RESECTION;  Surgeon: MPedro Earls MD;  [facility];  Service: General;  Laterality: N/A;  Laparoscopic Assisted Sigmoid Colectomy  . COLON SURGERY    . COLONOSCOPY  08/31/2011   Procedure: COLONOSCOPY;  Surgeon: Jerene Bears, MD;  Location: [facility];  Service: Gastroenterology;  Laterality: N/A;  . COLONOSCOPY N/A 09/05/2012   Procedure: COLONOSCOPY;  Surgeon: Jerene Bears, MD;  Location: [facility];  Service: Gastroenterology;  Laterality: N/A;  .  COLONOSCOPY N/A 04/18/2013   Procedure: COLONOSCOPY;  Surgeon: Jerene Bears, MD;  Location: [facility];  Service: Gastroenterology;  Laterality: N/A;  . COLONOSCOPY N/A 04/09/2014   Procedure: COLONOSCOPY;  Surgeon: Jerene Bears, MD;  Location: [facility];  Service: Gastroenterology;  Laterality: N/A;  . COLONOSCOPY WITH PROPOFOL N/A 05/03/2017   Procedure: COLONOSCOPY WITH PROPOFOL;  Surgeon: Jerene Bears, MD;  Location: [facility];  Service: Gastroenterology;  Laterality: N/A;  . CORONARY ANGIOPLASTY    . CORONARY ARTERY BYPASS GRAFT  01/2002   LIMA-LAD, SVG-OM 2-OM 3, SVG-RPDA-RPLB  . CORONARY STENT INTERVENTION N/A 11/22/2018   Procedure: CORONARY STENT INTERVENTION;  Surgeon: Nelva Bush, MD;  Location: [facility];  Service: Cardiovascular;  Laterality: N/A;  SVG - RCA  . ICD GENERATOR CHANGE  2010   Medtronic Virtuoso II VR ICD  . ICD GENERATOR CHANGEOUT N/A 12/07/2018   Procedure: ICD GENERATOR CHANGEOUT;  Surgeon: Deboraha Sprang, MD;  Location: [facility];  Service: Cardiovascular;  Laterality: N/A;  . INGUINAL HERNIA REPAIR Right 2000's X 2  . LAPAROSCOPIC RIGHT HEMI COLECTOMY N/A 11/04/2012   Procedure: LAPAROSCOPIC RIGHT HEMI COLECTOMY;  Surgeon: Pedro Earls, MD;  Location: [facility];  Service: General;  Laterality: N/A;  . LEFT HEART CATH AND CORS/GRAFTS ANGIOGRAPHY Left 11/22/2018   Procedure: LEFT HEART CATH AND CORS/GRAFTS ANGIOGRAPHY;  Surgeon: Nelva Bush, MD;  Location: [facility];  Service: Cardiovascular;  Laterality: Left;  . TONSILLECTOMY  ~ 1950    Assessment / Plan / Recommendation Clinical Impression   HPI:  Mark Clayton is a 79 year old male with history of colon cancer, Chronic systolic CHF with AICD, PAF, anemia, macular degeneration--legally blind, LV thrombus in the past who was admitted on  06/06/19 for work-up after falling backwards in the yard, striking his head and developing left-sided weakness.  CT head showed left  hemispheric SDH therefore Xarelto was reversed with Kcentra.  CTA/perfusion of head/neck showed branch occlusion left M3 with trickle flow corresponding acute infarct left parietal lobe with surrounding penumbra.  Neurology felt left-MCA infarct was embolic due to A. fib and no antithrombotic due to hemorrhage.  Dr. Trenton Gammon recommended conservative management with serial CT head for monitoring.  BP was elevated at admission and has been treated with Cleviprex.  2D echo showed global hypokinesis with EF of 20%, aortic sclerosis without stenosis and moderately elevated pulmonary pressures.   Follow-up CT head showed stable bleed with development of left frontoparietal infarct.  Dr. Erlinda Hong felt that patien tiwth embolic stroke due to AF and severe cardiomyopathy--SDH due to fall with stroke. Currently off antithrombotics and no AC due to SDH---To consider resuming AC in 2-3 weeks if SDH resolving and clinically stable.  Patient reported chocking on foods and MBS done revealing oropharyngeal dysphagia and residue--to continue dysphagia 2, thins with multiple hard swallows and throat clears.  He developed pre-renal azotemia and was treated with IVF X 24 hours.  He developed cough with upward trend in weight by 6 lbs and continues on IV lasix for  diuresis for acute on chronic CHF--to transition back to oral when back to baseline weight of 74 kg. Therapy has been ongoing and patient limited by dysarthria with expressive>receptive aphasia, paraphasias, right hemiparesis. Pt admitted to CIR 06/14/19 and SLP evaluations were completed 06/15/19 with results as follows:  Bedside swallow evaluation: Pt presents with mild-mod pharyngeal dysphagia. Pt's top dentures were unavailable, therefore no solids beyond Dys 2 were attempted. Pt consumed Dys 2 snack with efficient mastication and oral clearance. No overt s/sx aspiration were observed with Dys 2 or thin liquids via cup and straw. MD expressed concern for choking episode on eggs  during breakfast this morning, however believe it may have been an isolated event and discussed with pt the importance of slow rate, upright positioning, need for caution with dual consistencies. Due to their texture, scrambled eggs can present challenges for efficient mastication for some pt's with dysphagia, so recommend pt avoid eggs (requested none be sent on tray and oatmeal come instead per pt request) but continue Dys 2 diet with thin liquids, small pills may be given whole in pudding, please provide full supervision to ensure use of swallow strategies.   Cognitive-linguistic evaluation: Pt presents with mild cognitive impairments characterized by sustained attention and anticipatory awareness deficits noted during functional tasks. Pt able to verbally recall 3/3 swallow strategy recommendations without assistance. Pt did also demonstrate good intellectual awareness of linguistic and physical deficits, as well as ability to tie them to acute CVA. Mild dysarthria reduces pt's speech intelligibility to ~80-85% at the conversation level, primarily due to decreased vocal intensity and articulatory imprecision of consonant clusters and multisyllabic words. Pt with mild expressive aphasia characterized by word finding difficulties and occasional semantic and phonemic paraphasias. Pt was aware of all verbal errors and with intermittent ability to self correct. He completed responsive naming tasks with 100% accuracy, confrontation naming was ~75% accurate.   Recommend pt receive skilled ST services to address dysphagia, dysarthria, aphasia, and cognitive impairments detailed above in order to maximize functional communication, independence, and diet safety and efficiency prior to d/c.    Skilled Therapeutic Interventions          Bedside swallow and cognitive-linguistic evaluations were completed and results were reviewed with pt (see above for details).   SLP Assessment  Patient will need skilled Speech  [place] Pathology Services during CIR admission    Recommendations  SLP Diet Recommendations: Dysphagia 2 (Fine chop);Thin Liquid Administration via: Cup;Straw Medication Administration: Whole meds with puree Supervision: Patient able to self feed;Full supervision/cueing for compensatory strategies Compensations: Minimize environmental distractions;Slow rate;Small sips/bites;Multiple dry swallows after each bite/sip;Follow solids with liquid;Effortful swallow;Clear throat intermittently Postural Changes and/or Swallow Maneuvers: Seated upright 90 degrees Oral Care Recommendations: Oral care BID Patient destination: Home Follow up Recommendations: Home Health SLP;24 hour supervision/assistance Equipment Recommended: None recommended by SLP    SLP Frequency 3 to 5 out of 7 days   SLP Duration  SLP Intensity  SLP Treatment/Interventions 3-4 weeks  Minumum of 1-2 x/day, 30 to 90 minutes  Cognitive remediation/compensation;Cueing hierarchy;Dysphagia/aspiration precaution training;Functional tasks;Internal/external aids;Speech/Language facilitation;Therapeutic Activities;Patient/family education    Pain Pain Assessment Pain Scale: 0-10 Pain Score: 0-No pain      SLP Evaluation Cognition Overall Cognitive Status: Impaired/Different from baseline Arousal/Alertness: Awake/alert Orientation Level: Oriented X4 Attention: Sustained Sustained Attention: Impaired Sustained Attention Impairment: Verbal basic Awareness: Impaired Awareness Impairment: Anticipatory impairment Problem Solving: Appears intact Safety/Judgment: Appears intact  Comprehension Auditory Comprehension Overall Auditory Comprehension: Appears within functional limits for tasks assessed  Yes/No Questions: Within Functional Limits Commands: Within Functional Limits Conversation: Simple Interfering Components: Attention Visual Recognition/Discrimination Discrimination: Not tested Reading Comprehension Reading  Status: Not tested Expression Expression Primary Mode of Expression: Verbal Verbal Expression Overall Verbal Expression: Impaired Initiation: No impairment Automatic Speech: Name;Social Response;Month of year Level of Generative/Spontaneous Verbalization: Conversation Repetition: No impairment Naming: Impairment Responsive: 76-100% accurate Confrontation: Impaired Convergent: Not tested Divergent: Not tested Verbal Errors: Semantic paraphasias;Phonemic paraphasias;Aware of errors Pragmatics: No impairment Interfering Components: Speech intelligibility Effective Techniques: Open ended questions;Sentence completion Non-Verbal Means of Communication: Not applicable Written Expression Written Expression: Not tested Oral Motor Oral Motor/Sensory Function Overall Oral Motor/Sensory Function: Within functional limits Motor Speech Overall Motor Speech: Impaired Respiration: Within functional limits Phonation: Normal Resonance: Within functional limits Articulation: Impaired Level of Impairment: Conversation Intelligibility: Intelligibility reduced Word: 75-100% accurate Phrase: 75-100% accurate Sentence: 75-100% accurate Conversation: 75-100% accurate Motor Planning: Witnin functional limits Motor Speech Errors: Not applicable Effective Techniques: Slow rate;Increased vocal intensity   Intelligibility: Intelligibility reduced Word: 75-100% accurate Phrase: 75-100% accurate Sentence: 75-100% accurate Conversation: 75-100% accurate  Bedside Swallowing Assessment General Date of Onset: 06/06/19 Previous Swallow Assessment: BSE 06/07/19 Diet Prior to this Study: Dysphagia 2 (chopped);Thin liquids Temperature Spikes Noted: N/A Respiratory Status: Room air History of Recent Intubation: No Behavior/Cognition: Alert;Cooperative Oral Cavity - Dentition: Dentures, top;Dentures, bottom(Top plate missing) Self-Feeding Abilities: Able to feed self;Needs set up;Needs  assist Vision: Functional for self-feeding Patient Positioning: Upright in bed Baseline Vocal Quality: Normal Volitional Cough: Strong Volitional Swallow: Able to elicit  Oral Care Assessment Does patient have any of the following "high(er) risk" factors?: None of the above Does patient have any of the following "at risk" factors?: Other - dysphagia Patient is AT RISK: Order set for Adult Oral Care Protocol initiated -  "At Risk Patients" option selected (see row information) Patient is LOW RISK: Follow universal precautions (see row information) Ice Chips Ice chips: Not tested Thin Liquid Thin Liquid: Within functional limits Presentation: Cup;Straw Nectar Thick Nectar Thick Liquid: Not tested Honey Thick Honey Thick Liquid: Not tested Puree Puree: Within functional limits Solid Solid: Within functional limits BSE Assessment Risk for Aspiration Impact on safety and function: Mild aspiration risk  Short Term Goals: Week 1: SLP Short Term Goal 1 (Week 1): Pt will consume current diet with minimal s/sx aspiration and efficient mastication and oral clearance with use of recommended swallow stategies with Min A verbal/visual cues. SLP Short Term Goal 2 (Week 1): Pt will detect and correct semantic and/or phonemic errors with Min A multimodal cues. SLP Short Term Goal 3 (Week 1): Pt will demonstrate ability to use word-finding strategies in conversation with Min A cues. SLP Short Term Goal 4 (Week 1): Pt will use increased vocal intensity to achieve 90% intellgibility at the converstaion level with Min A cues. SLP Short Term Goal 5 (Week 1): Pt will susatin attention to tasks with Mod A verbal cues for redirection.  Refer to Care Plan for Long Term Goals  Recommendations for other services: None   Discharge Criteria: Patient will be discharged from SLP if patient refuses treatment 3 consecutive times without medical reason, if treatment goals not met, if there is a change in medical  status, if patient makes no progress towards goals or if patient is discharged from hospital.  The above assessment, treatment plan, treatment alternatives and goals were discussed and mutually agreed upon: by patient  Arbutus Leas 06/15/2019, 2:55 PM

## 2019-06-15 NOTE — Progress Notes (Signed)
Inpatient Rehabilitation  Patient information reviewed and entered into eRehab system by Benyamin Jeff M. Doraine Schexnider, M.A., CCC/SLP, PPS Coordinator.  Information including medical coding, functional ability and quality indicators will be reviewed and updated through discharge.    

## 2019-06-15 NOTE — Progress Notes (Signed)
Physical Therapy Assessment and Plan  Patient Details  Name: Mark Clayton MRN: [id number] Date of Birth: 1940/04/06  PT Diagnosis: Abnormal posture, Abnormality of gait, Ataxia, Ataxic gait, Coordination disorder, Hypotonia, Impaired cognition, Impaired sensation and Muscle weakness Rehab Potential: Good ELOS: 21-24 days   Today's Date: 06/15/2019 PT Individual Time: 0850-1000 PT Individual Time Calculation (min): 70 min    Problem List:  Patient Active Problem List   Diagnosis Date Noted  . Embolic stroke ([place]) 23/76/2831  . ICH (intracerebral hemorrhage) ([place]) 06/06/2019  . Dysarthria   . Tinnitus 08/26/2018  . Essential hypertension 01/21/2018  . Coronary artery disease involving native heart with angina pectoris ([place]) 10/19/2017  . Persistent atrial fibrillation   . Routine general medical examination at a health care facility 01/10/2016  . Unstable angina ([place]) 09/20/2014  . Ischemic cardiomyopathy   . Personal history of colon cancer   . Implantable cardioverter-defibrillator (ICD) in situ 03/09/2012  . Microcytic anemia 01/12/2011  . Cardiac defibrillator  MDT VVI 12/26/2010  . Chronic systolic heart failure ([place]) 11/26/2008    Past Medical History:  Past Medical History:  Diagnosis Date  . AICD (automatic cardioverter/defibrillator) present 01/17/2003   Medtronic Maximo 7232CX ICD, serial [id number]  . Anemia 02-06-11   takes oral iron  . Arthritis    hands, knees  . CAD (coronary artery disease) 2003   a. h/o MI and CABG in 2003. b. s/p DES to SVG-RPDA-RPLB in 08/2014.  Marland Kitchen Cancer of sigmoid colon ([place]) 2012   a. s/p colon surgery.  . Carotid bruit   . Chronic systolic CHF (congestive heart failure) (HCC)    a. EF 20% in 2014; b. 08/2017 Echo: EF 20-25%, diff HK, Gr3 DD. Triv AI. Mod MR. Sev dil LA. Mildly dil RV w/ mildly reduced RV fxn. Mildly dil RA. Mod TR. PASP 16mg.  .Marland KitchenCough   . GERD (gastroesophageal reflux disease) 02-06-11  . HTN (hypertension)   .  Hyperlipidemia   . Ischemic cardiomyopathy    a. EF 20% in 2014. (Master study EF >20%); b. 08/2017 Echo: EF 20-25%, diff HK. Gr3 DD.  . LV (left ventricular) mural thrombus    a. 12/2012 Echo: EF 20% with mural thrombus No evidence of thrombus on 08/2017 echo.  . Macular degeneration   . [place]ocardial infarct [place]    2003  . PAF (paroxysmal atrial fibrillation) (HCC)    a. CHA2DS2VASc = 5-->Xarelto/Tikosyn.   Past Surgical History:  Past Surgical History:  Procedure Laterality Date  . CARDIAC CATHETERIZATION N/A 09/20/2014   Procedure: Left Heart Cath and Coronary Angiography;  Surgeon: JJettie Booze MD; LAD 95%, D1 100%, CFX liner percent, OM 200%, OM 390%, RCA 90%, LIMA-LAD okay, SVG-OM 2-OM 3 minimal disease, SVG-RPDA-RPLB 100% between the RPDA and RPL     . CARDIAC CATHETERIZATION N/A 09/20/2014   Procedure: Coronary Stent Intervention;  Surgeon: JJettie Booze MD; Synergy DES 4 x 24 mm reducing the stenosis to 5%   . CARDIAC DEFIBRILLATOR PLACEMENT  01/17/03   6949 lead. Medtronic. remote-no; with later revision  . CARDIOVERSION N/A 05/22/2016   Procedure: CARDIOVERSION;  Surgeon: BLelon Perla MD;  [facility];  Service: Cardiovascular;  Laterality: N/A;  . CARDIOVERSION N/A 08/10/2017   Procedure: CARDIOVERSION;  Surgeon: HPixie Casino MD;  [facility];  Service: Cardiovascular;  Laterality: N/A;  . CATARACT EXTRACTION W/ INTRAOCULAR LENS  IMPLANT, BILATERAL Bilateral June/-July 2009   Dr. GKaty Fitch . COLON RESECTION  02/09/2011  Procedure: LAPAROSCOPIC SIGMOID COLON RESECTION;  Surgeon: Pedro Earls, MD;  Location: [facility];  Service: General;  Laterality: N/A;  Laparoscopic Assisted Sigmoid Colectomy  . COLON SURGERY    . COLONOSCOPY  08/31/2011   Procedure: COLONOSCOPY;  Surgeon: Jerene Bears, MD;  [facility]: [facility];  Service: Gastroenterology;  Laterality: N/A;  . COLONOSCOPY N/A 09/05/2012   Procedure: COLONOSCOPY;  Surgeon: Jerene Bears, MD;  [facility];  Service: Gastroenterology;  Laterality: N/A;  . COLONOSCOPY N/A 04/18/2013   Procedure: COLONOSCOPY;  Surgeon: Jerene Bears, MD;  [facility];  Service: Gastroenterology;  Laterality: N/A;  . COLONOSCOPY N/A 04/09/2014   Procedure: COLONOSCOPY;  Surgeon: Jerene Bears, MD;  [facility];  Service: Gastroenterology;  Laterality: N/A;  . COLONOSCOPY WITH PROPOFOL N/A 05/03/2017   Procedure: COLONOSCOPY WITH PROPOFOL;  Surgeon: Jerene Bears, MD;  [facility];  Service: Gastroenterology;  Laterality: N/A;  . CORONARY ANGIOPLASTY    . CORONARY ARTERY BYPASS GRAFT  01/2002   LIMA-LAD, SVG-OM 2-OM 3, SVG-RPDA-RPLB  . CORONARY STENT INTERVENTION N/A 11/22/2018   Procedure: CORONARY STENT INTERVENTION;  Surgeon: Nelva Bush, MD;  Location: [facility];  Service: Cardiovascular;  Laterality: N/A;  SVG - RCA  . ICD GENERATOR CHANGE  2010   Medtronic Virtuoso II VR ICD  . ICD GENERATOR CHANGEOUT N/A 12/07/2018   Procedure: ICD GENERATOR CHANGEOUT;  Surgeon: Deboraha Sprang, MD;  Location: [place] [facility];  Service: Cardiovascular;  Laterality: N/A;  . INGUINAL HERNIA REPAIR Right 2000's X 2  . LAPAROSCOPIC RIGHT HEMI COLECTOMY N/A 11/04/2012   Procedure: LAPAROSCOPIC RIGHT HEMI COLECTOMY;  Surgeon: Pedro Earls, MD;  [facility]: [facility];  Service: General;  Laterality: N/A;  . LEFT HEART CATH AND CORS/GRAFTS ANGIOGRAPHY Left 11/22/2018   Procedure: LEFT HEART CATH AND CORS/GRAFTS ANGIOGRAPHY;  Surgeon: Nelva Bush, MD;  Location: [place] [facility];  Service: Cardiovascular;  Laterality: Left;  . TONSILLECTOMY  ~ 1950    Assessment & Plan Clinical Impression: Patient is a 79 year old male with history of colon cancer, Chronic systolic CHF with AICD, PAF, anemia, macular degeneration--legally blind, LV thrombus in the past who was admitted on  06/06/19 for work-up after falling backwards in the yard, striking his head  and developing left-sided weakness.  CT head showed left hemispheric SDH therefore Xarelto was reversed with Kcentra.  CTA/perfusion of head/neck showed branch occlusion left M3 with trickle flow corresponding acute infarct left parietal lobe with surrounding penumbra.  Neurology felt left-MCA infarct was embolic due to A. fib and no antithrombotic due to hemorrhage.  Dr. Trenton Gammon recommended conservative management with serial CT head for monitoring.  BP was elevated at admission and has been treated with Cleviprex.  2D echo showed global hypokinesis with EF of 20%, aortic sclerosis without stenosis and moderately elevated pulmonary pressures.    Follow-up CT head showed stable bleed with development of left frontoparietal infarct.  Dr. Erlinda Hong felt that patien tiwth embolic stroke due to AF and severe cardiomyopathy--SDH due to fall with stroke. Currently off antithrombotics and no AC due to SDH---To consider resuming AC in 2-3 weeks if SDH resolving and clinically stable.  Patient reported chocking on foods and MBS done revealing oropharyngeal dysphagia and residue--to continue dysphagia 2, thins with multiple hard swallows and throat clears.  He developed pre-renal azotemia and was treated with IVF X 24 hours.  He developed cough with upward trend in weight by 6  lbs and continues on IV lasix for diuresis for acute on chronic CHF--to transition back to oral when back to baseline weight of 74 kg. Therapy has been ongoing and patient limited by dysarthria with expressive>receptive aphasia, paraphasias, right hemiparesis.   Patient transferred to CIR on 06/14/2019 .   Patient currently requires total with mobility secondary to muscle weakness, muscle joint tightness and muscle paralysis, decreased cardiorespiratoy endurance, impaired timing and sequencing, abnormal tone, decreased coordination and decreased motor planning, decreased attention to right, decreased attention, decreased awareness, decreased problem solving,  decreased safety awareness, decreased memory and delayed processing and decreased sitting balance, decreased standing balance, decreased postural control, hemiplegia and decreased balance strategies.  Prior to hospitalization, patient was independent  with mobility and lived with Family in a House home.  Home access is  Other (comment)(threshold).  Patient will benefit from skilled PT intervention to maximize safe functional mobility, minimize fall risk and decrease caregiver burden for planned discharge home with 24 hour assist.  Anticipate patient will benefit from follow up [facility] at discharge.  PT - End of Session Activity Tolerance: Tolerates < 10 min activity, no significant change in vital signs Endurance Deficit: Yes Endurance Deficit Description: multiple rest breaks secondary to fatigue PT Assessment Rehab Potential (ACUTE/IP ONLY): Good PT Barriers to Discharge: Decreased caregiver support;Home environment access/layout;Behavior;Insurance for SNF coverage PT Patient demonstrates impairments in the following area(s): Balance;Behavior;Endurance;Motor;Nutrition;Sensory;Skin Integrity PT Transfers Functional Problem(s): Bed Mobility;Bed to Chair;Car;Furniture;Floor PT Locomotion Functional Problem(s): Ambulation;Wheelchair Mobility;Stairs PT Plan PT Intensity: Minimum of 1-2 x/day ,45 to 90 minutes PT Frequency: 5 out of 7 days PT Duration Estimated Length of Stay: 21-24 days PT Treatment/Interventions: Ambulation/gait training;Discharge planning;Functional mobility training;Psychosocial support;Therapeutic Activities;Visual/perceptual remediation/compensation;Wheelchair propulsion/positioning;Therapeutic Exercise;Skin care/wound management;Neuromuscular re-education;Disease management/prevention;Balance/vestibular training;Cognitive remediation/compensation;DME/adaptive equipment instruction;UE/LE Strength taining/ROM;Splinting/orthotics;Pain management;Community reintegration;Functional  electrical stimulation;Patient/family education;Stair training;UE/LE Coordination activities PT Transfers Anticipated Outcome(s): min assist transfer to [place] with LRAD PT Locomotion Anticipated Outcome(s): WC level with supervision assist. Ambulatory for househould distances with LRAD and min assist PT Recommendation Follow Up Recommendations: Home health PT Patient destination: Home Equipment Recommended: Rolling walker with 5" wheels;Wheelchair cushion (measurements);Wheelchair (measurements)  Skilled Therapeutic Intervention Pt received supine in bed and agreeable to PT. NT present finishing pericare from BM. PT instructed patient in PT Evaluation and initiated treatment intervention; see below for results. PT educated patient in [place], rehab potential, rehab goals, and discharge recommendations.  Supine>sit transfer with max assist as listed. Sit<>stnad with max-total A due to poor awareness of RLE with poor volitional control. WC mobility with mod assist x 60f as listed. Pt unable to perform stair management or gait training due to FOF and poor Nuer[place]oro control of the RLE. [facility] and all needs met.      PT Evaluation Precautions/Restrictions   fall  Pain   denies Home Living/Prior Functioning Home Living Available Help at Discharge: Family;Available 24 hours/day Type of Home: House Home Access: Other (comment)(threshold) Home Layout: Able to live on main level with bedroom/bathroom Bathroom Shower/Tub: Tub/shower unit Additional Comments: Granddaughter goes to college T-Sa 10-2; states there are other family members who are offering to provide 24/7 i.e. sister lives down street, ex wife, daughters  Lives With: Family Prior Function Level of Independence: Independent with basic ADLs;Independent with homemaking with ambulation;Independent with gait;Independent with transfers Comments: 80% blind, does not drive. Manages  own medications with intermittent assist from granddaughter for set up, cooks, cleans. Enjoys wood working, bControl and instrumentation engineer  Vision/Perception  Perception Perception: Impaired Inattention/Neglect: Does not attend to right side of body Praxis Praxis: Impaired Praxis Impairment Details: Motor planning  Cognition Overall Cognitive Status: Impaired/Different from baseline Arousal/Alertness: Awake/alert Orientation Level: Oriented X4 Attention: Sustained Sustained Attention: Impaired Sustained Attention Impairment: Verbal basic Immediate Memory Recall: Sock;Blue;Bed Memory Recall Sock: Without Cue Memory Recall Blue: With Cue Memory Recall Bed: With Cue Awareness: Impaired Awareness Impairment: Anticipatory impairment Problem Solving: Appears intact Safety/Judgment: Appears intact Sensation Sensation Light Touch: Impaired Detail Light Touch Impaired Details: Absent RLE;Impaired RUE Hot/Cold: Impaired by gross assessment Proprioception: Impaired by gross assessment Stereognosis: Not tested Coordination Gross Motor Movements are Fluid and Coordinated: No Fine Motor Movements are Fluid and Coordinated: No Coordination and Movement Description: dense R hemiplegia Motor  Motor Motor: Hemiplegia;Abnormal tone  Mobility Bed Mobility Bed Mobility: Rolling Right;Sit to Supine;Rolling Left;Supine to Sit Rolling Right: Moderate Assistance - Patient 50-74% Rolling Left: Moderate Assistance - Patient 50-74% Supine to Sit: Maximal Assistance - Patient - Patient 25-49% Sit to Supine: Maximal Assistance - Patient 25-49% Transfers Transfers: Sit to Stand;Squat Pivot Transfers Sit to Stand: Maximal Assistance - Patient 25-49% Squat Pivot Transfers: Maximal Assistance - Patient 25-49% Transfer (Assistive device): None Locomotion  Gait Ambulation: No Gait Gait: No Stairs / Additional Locomotion Stairs: No Wheelchair Mobility Wheelchair Mobility: Yes Wheelchair Assistance: Moderate  Assistance - Patient 50 - 74% Wheelchair Propulsion: Left lower extremity;Left upper extremity Wheelchair Parts Management: Needs assistance Distance: 75  Trunk/Postural Assessment  Cervical Assessment Cervical Assessment: Exceptions to [facility] Thoracic Assessment Thoracic Assessment: Exceptions to [facility] Lumbar Assessment Lumbar Assessment: Exceptions to [facility] Postural Control Postural Control: Deficits on evaluation Righting Reactions: lateral lean R Protective Responses: decreased on the R Postural Limitations: poor control with lateral reach  Balance Balance Balance Assessed: Yes Static Sitting Balance Static Sitting - Level of Assistance: 4: Min assist Dynamic Sitting Balance Dynamic Sitting - Level of Assistance: 2: Max assist Sitting balance - Comments: poor when fatigued unable to maintain sitting without adequate support. Static Standing Balance Static Standing - Level of Assistance: 2: Max assist Dynamic Standing Balance Dynamic Standing - Balance Support: Left upper extremity supported Dynamic Standing - Level of Assistance: 2: Max assist;1: +1 Total assist Extremity Assessment      RLE Assessment RLE Assessment: Exceptions to [facility] General Strength Comments: 2/5 in extensor pattern with functional movement. unable to control volitionally. LLE Assessment LLE Assessment: Within Functional Limits    Refer to Care Plan for Long Term Goals  Recommendations for other services: None   Discharge Criteria: Patient will be discharged from PT if patient refuses treatment 3 consecutive times without medical reason, if treatment goals not met, if there is a change in medical status, if patient makes no progress towards goals or if patient is discharged from hospital.  The above assessment, treatment plan, treatment alternatives and goals were discussed and mutually agreed upon: by patient  Lorie Phenix 06/15/2019, 12:31 PM

## 2019-06-16 ENCOUNTER — Inpatient Hospital Stay (HOSPITAL_COMMUNITY): Payer: Medicare Other | Admitting: Physical Therapy

## 2019-06-16 ENCOUNTER — Inpatient Hospital Stay (HOSPITAL_COMMUNITY): Payer: Medicare Other | Admitting: Speech Pathology

## 2019-06-16 ENCOUNTER — Inpatient Hospital Stay (HOSPITAL_COMMUNITY): Payer: Medicare Other

## 2019-06-16 ENCOUNTER — Inpatient Hospital Stay (HOSPITAL_COMMUNITY): Payer: Medicare Other | Admitting: Occupational Therapy

## 2019-06-16 NOTE — Progress Notes (Signed)
Occupational Therapy Session Note  Patient Details  Name: Mark Clayton MRN: [id number] Date of Birth: January 28, 1941  Today's Date: 06/16/2019 OT Individual Time: [id number] OT Individual Time Calculation (min): 56 min   Short Term Goals: Week 1:  OT Short Term Goal 1 (Week 1): Pt will perform LB dressing with mod A overall. OT Short Term Goal 2 (Week 1): Pt will perform UB dressing with mod A overall. OT Short Term Goal 3 (Week 1): Pt will perform toilet transfer with mod A overall. OT Short Term Goal 4 (Week 1): Pt will perform toileting with max A overall.  Skilled Therapeutic Interventions/Progress Updates:    Pt greeted in bed with no c/o pain, being set up to eat his breakfast. His tray was accidentally mixed up with his roommate's this AM. Pt was agreeable to eat sitting EOB to work on sitting balance during meal (eating food with his designated meal ticket), Mod A for supine<sit and Mod A initially for sitting balance due to Rt lean. Pt progressed with balance, needing CGA-supervision assist for 50% of meal. We were able to incorporate his Rt hand to stabilize his oatmeal bowl at this time. Pt able to keep his Rt arm elevated on bedside table with supervision assist while lightly gripping bowl. Near end of meal he required Min A for sitting balance, due to pt reporting feeling like he was "falling" though he was not. Due to cockroach in bathroom and curtain partition not occluding pt from view of his roommate if seated at sink, opted for bathing/dressing task completion EOB. Pt able to wash his Lt side using one handed techniques, vcs for sustained attention to task due to tangential speech at times. HOH to incorporate Rt hand to wash his Rt thigh. Min-Mod A for dynamic sitting overall. Pt then reported needing to void bowels. +2 for stand pivot<BSC with pt not having drop arm BSC or Stedy in the room. He needed manual facilitation for Lt LE advancement and knee stability due to buckling. Max A  of 1 for standing balance while 2nd helper completed perihygiene and elevated clean brief. Multimodal cues provided to minimize his Rt lean in standing. +2 for stand pivot<bed with pt able to side step up towards [facility] with Max A for maintaining stability of Rt knee. +2 for returning to supine where pt was then repositioned for comfort while protecting hemiplegic side. Pt left comfortably in bed, stating "I feel so much better now!" Call bell within reach on Lt side, bed alarm set.   Pt needing location cues throughout session due to visual deficits, also found his back to be red. Pt states it's itchy with visible scratch marks. We washed and applied lotion to back during session. Notified RN, recommended something to order medicinally to address.   Therapy Documentation Precautions:  Precautions Precautions: Fall Precaution Comments: R hemiparesis Restrictions Weight Bearing Restrictions: No ADL:        Therapy/Group: Individual Therapy  Siani Utke A Miken Stecher 06/16/2019, 12:16 PM

## 2019-06-16 NOTE — Progress Notes (Signed)
Physical Therapy Session Note  Patient Details  Name: Mark Clayton MRN: [id number] Date of Birth: Aug 08, 1940  Today's Date: 06/16/2019 PT Individual Time: 0945-1100 PT Individual Time Calculation (min): 75 min   Short Term Goals: Week 1:  PT Short Term Goal 1 (Week 1): Pt will perform bed mobility with mod assist PT Short Term Goal 2 (Week 1): pt will transfe to [facility] with mod assist PT Short Term Goal 3 (Week 1): Pt will ambulate 70ft with max assist of 1 PT Short Term Goal 4 (Week 1): Pt will propell WC 144ft with min assist  Skilled Therapeutic Interventions/Progress Updates:   Pt received supine in bed and agreeable to PT. Supine>sit transfer with mod assist and max cyues for trunkal Nueromotor control and coordination on the R. Sitting EOB with mod assist initially with R/posterior LOB progressing to CGA. Squat pivot transfer to [facility] on the L with mod-max assist and max multimodal cues for sequencing,  LE positioning and sustained anterior weight shift.   Pt transported to rail in hall outside rehab gym. Sit<>stand at rail with visual feedback from mirror. Mod assist to achieve standing, with RLE blocked to prevent buckling. Pt able to sustain standing balance with mod assist to prevent R lateral lean 30 sec, then reports severe dizziness/lightheadedness. Returned to sitting in WC, noted palor and increased dysarthria as well as mild tremmor in LUE. Pt returned to room in [facility], able to respond to questions appropriately in transport, but delayed response time. Squat pivot transfer to bed with max assist on the L. Sit>supine with mod assist.   PT assessed supine BP 91/65. Added TED hose and BP reassessed 111/54. While in supine, pt noted to have bouts of apnea lasting ~55seconds.  Orthostatic vital signs assessd. Supine 111/55. Sitting 71/60, standing 99/64. Orthostatic s/s throughout Pt's pulse noted to fluctuate from 45-135bpm sitting EOB. Sit<>supine with mod assist fotr LUE/RLE control and  cues for sequencing to improve safety.   Pt left supine in bed with call bell in reach.        Therapy Documentation Precautions:  Precautions Precautions: Fall Precaution Comments: R hemiparesis Restrictions Weight Bearing Restrictions: No Vital Signs: Therapy Vitals Temp: 98 F (36.7 C) Temp Source: Oral Pulse Rate: 83 Resp: 18 BP: 118/73 Patient Position (if appropriate): Sitting Oxygen Therapy SpO2: 97 % O2 Device: Room Air Pain: Pain Assessment Pain Scale: Faces Faces Pain Scale: No hurt   Therapy/Group: Individual Therapy  Lorie Phenix 06/16/2019, 6:01 PM

## 2019-06-16 NOTE — Care Management (Signed)
[place] Individual Statement of Services  Patient Name:  Mark Clayton  Date:  06/16/2019  Welcome to the [facility].  Our goal is to provide you with an individualized program based on your diagnosis and situation, designed to meet your specific needs.  With this comprehensive rehabilitation program, you will be expected to participate in at least 3 hours of rehabilitation therapies Monday-Friday, with modified therapy programming on the weekends.  Your rehabilitation program will include the following services:  Physical Therapy (PT), Occupational Therapy (OT), Speech Therapy (ST), 24 hour per day rehabilitation nursing, Therapeutic Recreaction (TR), Psychology, Case Management (Social Worker), Rehabilitation Medicine, Nutrition Services and Pharmacy Services  Weekly team conferences will be held on Wednesdays to discuss your progress.  Your Social Industrial/product designer will talk with you frequently to get your input and to update you on team discussions.  Team conferences with you and your family in attendance may also be held.  Expected length of stay: 24 days Overall anticipated outcome: Minimal assistance  Depending on your progress and recovery, your program may change. Your Social Industrial/product designer will coordinate services and will keep you informed of any changes. Your Social Worker's/Care Manager's name and contact numbers are listed  below.  The following services may also be recommended but are not provided by the [place]:    [place] will be made to provide these services after discharge if needed.  Arrangements include referral to agencies that provide these services.  Your insurance has been verified to be:  [employer] Your primary doctor is:  Pricilla Holm, MD  Pertinent information will be shared with your doctor and your  insurance company.  Care Manager/Social Worker: Dorien Chihuahua, RN [phone] or [phone]  Information discussed with and copy given to patient by: Margarito Liner, 06/16/2019, 6:17 PM

## 2019-06-16 NOTE — IPOC Note (Signed)
Overall Plan of Care [facility]) Patient Details Name: Mark Clayton MRN: [id number] DOB: 06/02/1940  Admitting Diagnosis: Embolic stroke [facility])  Hospital Problems: Principal Problem:   Embolic stroke [facility])     Functional Problem List: Nursing Bladder, Bowel, Pain, Safety, Perception, Sensory, Skin Integrity  PT Balance, Behavior, Endurance, Motor, Nutrition, Sensory, Skin Integrity  OT Balance, Behavior, Endurance, Cognition, Motor, Pain, Safety  SLP Cognition, Linguistic, Nutrition  TR         Basic ADL's: OT Eating, Grooming, Bathing, Dressing, Toileting     Advanced  ADL's: OT       Transfers: PT Bed Mobility, Bed to Chair, Car, Furniture, Floor  OT Toilet, Metallurgist: PT Ambulation, Emergency planning/management officer, Stairs     Additional Impairments: OT Fuctional Use of Upper Extremity  SLP Swallowing, Communication, Social Cognition expression Attention, Awareness  TR      Anticipated Outcomes Item Anticipated Outcome  Self Feeding set up A  Swallowing  Supervision A   Basic self-care  S - min A  Toileting  min A   Bathroom Transfers min A  Bowel/Bladder  manage with mod assist  Transfers  min assist transfer to [facility] with LRAD  Locomotion  WC level with supervision assist. Ambulatory for househould distances with LRAD and min assist  Communication  Mod I  Cognition  Supervision A  Pain  Pain less than 5  Safety/Judgment  adhere to safety plan   Therapy Plan: PT Intensity: Minimum of 1-2 x/day ,45 to 90 minutes PT Frequency: 5 out of 7 days PT Duration Estimated Length of Stay: 21-24 days OT Intensity: Minimum of 1-2 x/day, 45 to 90 minutes OT Frequency: 5 out of 7 days OT Duration/Estimated Length of Stay: 21-24 days SLP Intensity: Minumum of 1-2 x/day, 30 to 90 minutes SLP Frequency: 3 to 5 out of 7 days SLP Duration/Estimated Length of Stay: 3-4 weeks   Due to the current state of emergency, patients may not be receiving their 3-hours of  Medicare-mandated therapy.   Team Interventions: Nursing Interventions Patient/Family Education, Bladder Management, Bowel Management, Disease Management/Prevention, Pain Management, Skin Care/Wound Management, Discharge Planning, Medication Management  PT interventions Ambulation/gait training, Discharge planning, Functional mobility training, Psychosocial support, Therapeutic Activities, Visual/perceptual remediation/compensation, Wheelchair propulsion/positioning, Therapeutic Exercise, Skin care/wound management, Neuromuscular re-education, Disease management/prevention, Training and development officer, Cognitive remediation/compensation, DME/adaptive equipment instruction, UE/LE Strength taining/ROM, Splinting/orthotics, Pain management, Community reintegration, Functional electrical stimulation, Patient/family education, IT trainer, UE/LE Coordination activities  OT Interventions Training and development officer, Self Care/advanced ADL retraining, UE/LE Coordination activities, Cognitive remediation/compensation, Functional mobility training, Community reintegration, Neuromuscular re-education, Splinting/orthotics, Wheelchair propulsion/positioning, Discharge planning, Pain management, Therapeutic Activities, Patient/family education, Therapeutic Exercise, DME/adaptive equipment instruction, Psychosocial support  SLP Interventions Cognitive remediation/compensation, Cueing hierarchy, Dysphagia/aspiration precaution training, Functional tasks, Internal/external aids, Speech/Language facilitation, Therapeutic Activities, Patient/family education  TR Interventions    SW/CM Interventions     Barriers to Discharge MD  Medical stability  Nursing Home environment access/layout, Incontinence, Medication compliance, Nutrition means    PT Decreased caregiver support, Home environment access/layout, Behavior, Insurance for SNF coverage    OT Other (comments) none known at this time  SLP      SW        Team Discharge Planning: Destination: PT-Home ,OT- Home , SLP-Home Projected Follow-up: PT-Home health PT, OT-  Home health OT, 24 hour supervision/assistance, SLP-Home Health SLP, 24 hour supervision/assistance Projected Equipment Needs: PT-Rolling walker with 5" wheels, Wheelchair cushion (measurements), Wheelchair (measurements), OT- To be determined, SLP-None recommended by  SLP Equipment Details: PT- , OT-  Patient/family involved in discharge planning: PT- Patient,  OT-Patient, SLP-Patient  MD ELOS: 18-21d Medical Rehab Prognosis:  Good Assessment: 79 year old male with history of colon cancer, Chronic systolic CHF with AICD, PAF, anemia, macular degeneration--legally blind, LV thrombus in the past who was admitted on  06/06/19 for work-up after falling backwards in the yard, striking his head and developing left-sided weakness.  CT head showed left hemispheric SDH therefore Xarelto was reversed with Kcentra.  CTA/perfusion of head/neck showed branch occlusion left M3 with trickle flow corresponding acute infarct left parietal lobe with surrounding penumbra.  Neurology felt left-MCA infarct was embolic due to A. fib and no antithrombotic due to hemorrhage.  Dr. Trenton Gammon recommended conservative management with serial CT head for monitoring.  BP was elevated at admission and has been treated with Cleviprex.  2D echo showed global hypokinesis with EF of 20%, aortic sclerosis without stenosis and moderately elevated pulmonary pressures.     Now requiring 24/7 Rehab RN,MD, as well as CIR level PT, OT and SLP.  Treatment team will focus on ADLs and mobility with goals set at MinA/S See Team Conference Notes for weekly updates to the plan of care

## 2019-06-16 NOTE — Progress Notes (Signed)
Physical Therapy Session Note  Patient Details  Name: Mark Clayton MRN: [id number] Date of Birth: 1940-11-03  Today's Date: 06/16/2019 PT Individual Time: 1340-1410 PT Individual Time Calculation (min): 30 min   Short Term Goals: Week 1:  PT Short Term Goal 1 (Week 1): Pt will perform bed mobility with mod assist PT Short Term Goal 2 (Week 1): pt will transfe to [facility] with mod assist PT Short Term Goal 3 (Week 1): Pt will ambulate 18ft with max assist of 1 PT Short Term Goal 4 (Week 1): Pt will propell WC 162ft with min assist  Skilled Therapeutic Interventions/Progress Updates:    Pt eating lunch and request to defer therapy until after he eats, so PT returned later. Pt agreeable to session. BP assessed due to low BP values earlier and orthostatic symptoms. See below for details and pt denies any issues with orthostasis this session. Mod assist for rolling for pulling up pants usign bed rails for support and mod to max assist to come to EOB with support needed for trunk due to weakness. Mod assist to scoot forward to EOB. BP assessed (see below). Attempted to don shoes for transfer OOB but pt reporting pain in R foot when attemping to put on shoe. Pt described it near heel and shooting pain in calf. Attempted to recreate with ankle movement and pressure and it was inconsistent but when able to reproduce with DF and pressure in the heel, pt reports calf pain. RN made aware. Pt unable to tolerate donning of sock either, so returned to supine with mod assist. Hand off to case manager  Therapy Documentation Precautions:  Precautions Precautions: Fall Precaution Comments: R hemiparesis Restrictions Weight Bearing Restrictions: No   Vital Signs: Therapy Vitals BP: (!) 106/54 Patient Position (if appropriate): Lying 112/75 mmHg; seated EOB  Pain: c/o pain in RLE (foot to calf area) with pressure through heel movement at the ankle when donning shoes. RN made aware.   Therapy/Group:  Individual Therapy  [name], PT, DPT, CBIS  06/16/2019, 2:27 PM

## 2019-06-16 NOTE — Care Management (Signed)
Patient Details  Name: Mark Clayton MRN: [id number] Date of Birth: [birth year]  Today's Date: 06/16/2019  Problem List:  Patient Active Problem List   Diagnosis Date Noted  . Embolic stroke ([place]) XX123456  . ICH (intracerebral hemorrhage) ([place]) 06/06/2019  . Dysarthria   . Tinnitus 08/26/2018  . Essential hypertension 01/21/2018  . Coronary artery disease involving native heart with angina pectoris ([place]) 10/19/2017  . Persistent atrial fibrillation   . Routine general medical examination at a health care facility 01/10/2016  . Unstable angina ([place]) 09/20/2014  . Ischemic cardiomyopathy   . Personal history of colon cancer   . Implantable cardioverter-defibrillator (ICD) in situ 03/09/2012  . Microcytic anemia 01/12/2011  . Cardiac defibrillator  MDT VVI 12/26/2010  . Chronic systolic heart failure ([place]) 11/26/2008   Past Medical History:  Past Medical History:  Diagnosis Date  . AICD (automatic cardioverter/defibrillator) present 01/17/2003   Medtronic Maximo 7232CX ICD, serial [id number]  . Anemia 02-06-11   takes oral iron  . Arthritis    hands, knees  . CAD (coronary artery disease) 2003   a. h/o MI and CABG in 2003. b. s/p DES to SVG-RPDA-RPLB in 08/2014.  Marland Kitchen Cancer of sigmoid colon ([place]) 2012   a. s/p colon surgery.  . Carotid bruit   . Chronic systolic CHF (congestive heart failure) (HCC)    a. EF 20% in 2014; b. 08/2017 Echo: EF 20-25%, diff HK, Gr3 DD. Triv AI. Mod MR. Sev dil LA. Mildly dil RV w/ mildly reduced RV fxn. Mildly dil RA. Mod TR. PASP 13mHg.  Marland Kitchen Cough   . GERD (gastroesophageal reflux disease) 02-06-11  . HTN (hypertension)   . Hyperlipidemia   . Ischemic cardiomyopathy    a. EF 20% in 2014. (Master study EF >20%); b. 08/2017 Echo: EF 20-25%, diff HK. Gr3 DD.  . LV (left ventricular) mural thrombus    a. 12/2012 Echo: EF 20% with mural thrombus No evidence of thrombus on 08/2017 echo.  . Macular degeneration   . Myocardial infarct ([place])    2003   . PAF (paroxysmal atrial fibrillation) (HCC)    a. CHA2DS2VASc = 5-->Xarelto/Tikosyn.   Past Surgical History:  Past Surgical History:  Procedure Laterality Date  . CARDIAC CATHETERIZATION N/A 09/20/2014   Procedure: Left Heart Cath and Coronary Angiography;  Surgeon: Jettie Booze, MD; LAD 95%, D1 100%, CFX liner percent, OM 200%, OM 390%, RCA 90%, LIMA-LAD okay, SVG-OM 2-OM 3 minimal disease, SVG-RPDA-RPLB 100% between the RPDA and RPL     . CARDIAC CATHETERIZATION N/A 09/20/2014   Procedure: Coronary Stent Intervention;  Surgeon: Jettie Booze, MD; Synergy DES 4 x 24 mm reducing the stenosis to 5%   . CARDIAC DEFIBRILLATOR PLACEMENT  01/17/03   6949 lead. Medtronic. remote-no; with later revision  . CARDIOVERSION N/A 05/22/2016   Procedure: CARDIOVERSION;  Surgeon: Lelon Perla, MD;  Location: [facility] ENDOSCOPY;  Service: Cardiovascular;  Laterality: N/A;  . CARDIOVERSION N/A 08/10/2017   Procedure: CARDIOVERSION;  Surgeon: Pixie Casino, MD;  Location: [facility] ENDOSCOPY;  Service: Cardiovascular;  Laterality: N/A;  . CATARACT EXTRACTION W/ INTRAOCULAR LENS  IMPLANT, BILATERAL Bilateral June/-July 2009   Dr. Katy Fitch  . COLON RESECTION  02/09/2011   Procedure: LAPAROSCOPIC SIGMOID COLON RESECTION;  Surgeon: Pedro Earls, MD;  Location: [facility] ORS;  Service: General;  Laterality: N/A;  Laparoscopic Assisted Sigmoid Colectomy  . COLON SURGERY    . COLONOSCOPY  08/31/2011   Procedure: COLONOSCOPY;  Surgeon: Jerene Bears,  MD;  Location: [facility] ENDOSCOPY;  Service: Gastroenterology;  Laterality: N/A;  . COLONOSCOPY N/A 09/05/2012   Procedure: COLONOSCOPY;  Surgeon: Jerene Bears, MD;  Location: [facility] ENDOSCOPY;  Service: Gastroenterology;  Laterality: N/A;  . COLONOSCOPY N/A 04/18/2013   Procedure: COLONOSCOPY;  Surgeon: Jerene Bears, MD;  Location: [facility] ENDOSCOPY;  Service: Gastroenterology;  Laterality: N/A;  . COLONOSCOPY N/A 04/09/2014   Procedure: COLONOSCOPY;  Surgeon: Jerene Bears, MD;   Location: [facility] ENDOSCOPY;  Service: Gastroenterology;  Laterality: N/A;  . COLONOSCOPY WITH PROPOFOL N/A 05/03/2017   Procedure: COLONOSCOPY WITH PROPOFOL;  Surgeon: Jerene Bears, MD;  Location: [facility] ENDOSCOPY;  Service: Gastroenterology;  Laterality: N/A;  . CORONARY ANGIOPLASTY    . CORONARY ARTERY BYPASS GRAFT  01/2002   LIMA-LAD, SVG-OM 2-OM 3, SVG-RPDA-RPLB  . CORONARY STENT INTERVENTION N/A 11/22/2018   Procedure: CORONARY STENT INTERVENTION;  Surgeon: Nelva Bush, MD;  Location: [place] CV LAB;  Service: Cardiovascular;  Laterality: N/A;  SVG - RCA  . ICD GENERATOR CHANGE  2010   Medtronic Virtuoso II VR ICD  . ICD GENERATOR CHANGEOUT N/A 12/07/2018   Procedure: ICD GENERATOR CHANGEOUT;  Surgeon: Deboraha Sprang, MD;  Location: [place] CV LAB;  Service: Cardiovascular;  Laterality: N/A;  . INGUINAL HERNIA REPAIR Right 2000's X 2  . LAPAROSCOPIC RIGHT HEMI COLECTOMY N/A 11/04/2012   Procedure: LAPAROSCOPIC RIGHT HEMI COLECTOMY;  Surgeon: Pedro Earls, MD;  Location: [facility] ORS;  Service: General;  Laterality: N/A;  . LEFT HEART CATH AND CORS/GRAFTS ANGIOGRAPHY Left 11/22/2018   Procedure: LEFT HEART CATH AND CORS/GRAFTS ANGIOGRAPHY;  Surgeon: Nelva Bush, MD;  Location: [place] CV LAB;  Service: Cardiovascular;  Laterality: Left;  . TONSILLECTOMY  ~ 1950   Social History:  reports that he has never smoked. He has never used smokeless tobacco. He reports current alcohol use of about 2.0 standard drinks of alcohol per week. He reports that he does not use drugs.  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   Abuse/Neglect Abuse/Neglect Assessment Can Be Completed: Yes Physical Abuse: Denies Verbal Abuse: Denies Sexual Abuse: Denies Exploitation of patient/patient's resources: Denies Self-Neglect: Denies  Emotional Status Pt's affect, behavior and adjustment status: Normal affect, can be "fiesty" and kurt at times Recent Psychosocial Issues: Insomnia, night terrors  Patient / Family Perceptions, Expectations & Goals Pt/Family understanding of illness & functional limitations: Patient appears to have a fair understanding of current health issues and functional limitations Premorbid pt/family roles/activities: Independent prior to admission Anticipated changes in roles/activities/participation: May need assistance at discharge due to hemi paresis Pt/family expectations/goals: Patient and family expect that patient to get as close to pre-admission level as possible and can provide minimal assistance as needed  [employer]: None Premorbid Home Care/DME Agencies: None Transportation available at discharge: Did not drive PTA; family will provide transportation Resource referrals recommended: Neuropsychology  Discharge Planning Living Arrangements: Other relatives Support Systems: Children, Water engineer, Social worker community Type of Residence: Private residence Insurance Resources: Medicare Money Management: Patient Does the patient have any problems obtaining your medications?: No Home Management: Family will manage the home, cooking, Archivist Preliminary Plans: Discharge home with grand-daughter who works and other family and friends  will assist when she is not home Social Work Anticipated Follow Up Needs: HH/OP Expected length of stay: 24 days  Clinical Impression Patient was very polite during our conversation however noted previous conversations with other staff to be "snippy, impatient and kurt". Noted  "don't you think that it is a little premature" when reviewing my roles and reason for the visit to plan care and prep for discharge. Noted he would like to be as independent as possible however he was having difficulty with hemiparesis on the right side and could not think about going home like he was currently. Has a few pieces of DME from his wife who had cancer but feels his family will be able to manage his care at discharge. Sister came in at the end of our conversation and supported that he had friends and family to provide care and understood goals for discharge and HH recommendations.  Dorien Chihuahua B 06/16/2019, 6:26 PM

## 2019-06-16 NOTE — Progress Notes (Signed)
Speech Language Pathology Daily Session Note  Patient Details  Name: Mark Clayton MRN: [id number] Date of Birth: May 16, 1940  Today's Date: 06/16/2019 SLP Individual Time: 0213-0243 SLP Individual Time Calculation (min): 30 min  Short Term Goals: Week 1: SLP Short Term Goal 1 (Week 1): Pt will consume current diet with minimal s/sx aspiration and efficient mastication and oral clearance with use of recommended swallow stategies with Min A verbal/visual cues. SLP Short Term Goal 2 (Week 1): Pt will detect and correct semantic and/or phonemic errors with Min A multimodal cues. SLP Short Term Goal 3 (Week 1): Pt will demonstrate ability to use word-finding strategies in conversation with Min A cues. SLP Short Term Goal 4 (Week 1): Pt will use increased vocal intensity to achieve 90% intellgibility at the converstaion level with Min A cues. SLP Short Term Goal 5 (Week 1): Pt will susatin attention to tasks with Mod A verbal cues for redirection.  Skilled Therapeutic Interventions: Pt was seen for skilled ST targeting speech goals. Education regarding compensatory strategies for word finding as well as speech intelligibility provided, although pt minimally response to cues for increased vocal intensity in conversation. He and his sister state he is soft spoken at baseline; education provided regarding acute changes requiring new strategies to increase intelligibility. Pt self corrected phonemic paraphasic errors X2 during conversation level speech tasks. Pt left sitting in bed with alarm set and sister present. Continue per current plan of care.        Pain Pain Assessment Pain Scale: Faces Faces Pain Scale: No hurt  Therapy/Group: Individual Therapy  Arbutus Leas 06/16/2019, 7:27 AM

## 2019-06-16 NOTE — Progress Notes (Signed)
[place] PHYSICAL MEDICINE & REHABILITATION PROGRESS NOTE   Subjective/Complaints:  Reviewed doppler , no leg pain  Several BMs without laxatives  Slept ~7.5 hrs  ROS-neg CP, SOB, N/V?D  Objective:   VAS [place] LOWER EXTREMITY VENOUS (DVT)  Result Date: 06/15/2019  Lower Venous DVTStudy Indications: Stroke. Other Indications: Immobility/ hemiparesis. Comparison Study: no prior Performing Technologist: Abram Sander RVS  Examination Guidelines: A complete evaluation includes B-mode imaging, spectral Doppler, color Doppler, and power Doppler as needed of all accessible portions of each vessel. Bilateral testing is considered an integral part of a complete examination. Limited examinations for reoccurring indications may be performed as noted. The reflux portion of the exam is performed with the patient in reverse Trendelenburg.  +---------+---------------+---------+-----------+----------+--------------+ RIGHT    CompressibilityPhasicitySpontaneityPropertiesThrombus Aging +---------+---------------+---------+-----------+----------+--------------+ CFV      Full           Yes      Yes                                 +---------+---------------+---------+-----------+----------+--------------+ SFJ      Full                                                        +---------+---------------+---------+-----------+----------+--------------+ FV Prox  Full                                                        +---------+---------------+---------+-----------+----------+--------------+ FV Mid   Full                                                        +---------+---------------+---------+-----------+----------+--------------+ FV DistalFull                                                        +---------+---------------+---------+-----------+----------+--------------+ PFV      Full                                                         +---------+---------------+---------+-----------+----------+--------------+ POP      Full           Yes      Yes                                 +---------+---------------+---------+-----------+----------+--------------+ PTV      Full                                                        +---------+---------------+---------+-----------+----------+--------------+  PERO     Full                                                        +---------+---------------+---------+-----------+----------+--------------+   +---------+---------------+---------+-----------+----------+--------------+ LEFT     CompressibilityPhasicitySpontaneityPropertiesThrombus Aging +---------+---------------+---------+-----------+----------+--------------+ CFV      Full           Yes      Yes                                 +---------+---------------+---------+-----------+----------+--------------+ SFJ      Full                                                        +---------+---------------+---------+-----------+----------+--------------+ FV Prox  Full                                                        +---------+---------------+---------+-----------+----------+--------------+ FV Mid   Full                                                        +---------+---------------+---------+-----------+----------+--------------+ FV DistalFull                                                        +---------+---------------+---------+-----------+----------+--------------+ PFV      Full                                                        +---------+---------------+---------+-----------+----------+--------------+ POP      Full           Yes      Yes                                 +---------+---------------+---------+-----------+----------+--------------+ PTV      Full                                                         +---------+---------------+---------+-----------+----------+--------------+ PERO     Full                                                        +---------+---------------+---------+-----------+----------+--------------+  Summary: BILATERAL: - No evidence of deep vein thrombosis seen in the lower extremities, bilaterally.   *See table(s) above for measurements and observations. Electronically signed by Servando Snare MD on 06/15/2019 at 4:43:09 PM.    Final    Recent Labs    06/15/19 0725  WBC 8.6  HGB 13.0  HCT 39.1  PLT 183   Recent Labs    06/14/19 0400 06/15/19 0725  NA 138 136  K 4.0 3.8  CL 103 99  CO2 25 23  GLUCOSE 126* 140*  BUN 17 14  CREATININE 1.06 1.08  CALCIUM 8.6* 8.8*    Intake/Output Summary (Last 24 hours) at 06/16/2019 0726 Last data filed at 06/15/2019 1947 Gross per 24 hour  Intake 240 ml  Output 300 ml  Net -60 ml     Physical Exam: Vital Signs Blood pressure (!) 143/68, pulse 84, temperature 97.9 F (36.6 C), temperature source Oral, resp. rate 16, height 6' (1.829 m), weight 68.8 kg, SpO2 96 %.    General: No acute distress Mood and affect are appropriate Heart: IRRR no rubs murmurs or extra sounds Lungs: Clear to auscultation, breathing unlabored, no rales or wheezes Abdomen: Positive bowel sounds, soft nontender to palpation, nondistended Extremities: No clubbing, cyanosis, or edema Skin: No evidence of breakdown, no evidence of rash Neurologic: Cranial nerves II through XII intact, motor strength is 5/5 in Left  2- RIght deltoid, bicep, tricep, grip, hip flexor, knee extensors, ankle dorsiflexor and plantar flexor Sensory exam normal sensation to light touch and proprioception in bilateral upper and lower extremities Cerebellar exam normal finger to nose to finger as well as heel to shin in bilateral upper and lower extremities Musculoskeletal: Full range of motion in all 4 extremities. No joint swelling  Assessment/Plan: 1.  Functional deficits secondary to CVA L MCA and subdural  which require 3+ hours per day of interdisciplinary therapy in a comprehensive inpatient rehab setting.  Physiatrist is providing close team supervision and 24 hour management of active medical problems listed below.  Physiatrist and rehab team continue to assess barriers to discharge/monitor patient progress toward functional and medical goals  Care Tool:  Bathing  Bathing activity did not occur: Refused           Bathing assist       Upper Body Dressing/Undressing Upper body dressing   What is the patient wearing?: Pull over shirt    Upper body assist Assist Level: Maximal Assistance - Patient 25 - 49%    Lower Body Dressing/Undressing Lower body dressing      What is the patient wearing?: Incontinence brief, Pants     Lower body assist Assist for lower body dressing: Total Assistance - Patient < 25%     Toileting Toileting    Toileting assist Assist for toileting: Maximal Assistance - Patient 25 - 49%     Transfers Chair/bed transfer  Transfers assist     Chair/bed transfer assist level: Maximal Assistance - Patient 25 - 49%     Locomotion Ambulation   Ambulation assist   Ambulation activity did not occur: Safety/medical concerns          Walk 10 feet activity   Assist  Walk 10 feet activity did not occur: Safety/medical concerns        Walk 50 feet activity   Assist Walk 50 feet with 2 turns activity did not occur: Safety/medical concerns         Walk 150 feet activity   Assist Walk  150 feet activity did not occur: Safety/medical concerns         Walk 10 feet on uneven surface  activity   Assist Walk 10 feet on uneven surfaces activity did not occur: Safety/medical concerns         Wheelchair     Assist        Wheelchair assist level: Maximal Assistance - Patient 25 - 49% Max wheelchair distance: 75    Wheelchair 50 feet with 2 turns  activity    Assist        Assist Level: Moderate Assistance - Patient 50 - 74%   Wheelchair 150 feet activity     Assist      Assist Level: Maximal Assistance - Patient 25 - 49%   Blood pressure (!) 143/68, pulse 84, temperature 97.9 F (36.6 C), temperature source Oral, resp. rate 16, height 6' (1.829 m), weight 68.8 kg, SpO2 96 %.  Medical Problem List and Plan: 1.  Left parafalcine subdural hematoma and subarachnoid hemorrhage, Left MCA M3 branch embolic stroke  CIR evals today              -patient may shower             -ELOS/Goals: 2-3 weeks 2.  Antithrombotics: -DVT/anticoagulation:  Mechanical: Sequential compression devices, below knee Bilateral lower extremities--Follow up CT head in 2 weeks to decide on resuming AC.              -antiplatelet therapy: N/A 3. Pain Management: N/a 4. Mood: LCSW to follow for evaluation and support.              -antipsychotic agents: N/A 5. Neuropsych: This patient is capable of making decisions on his own behalf. 6. Skin/Wound Care: Routine pressure relief measures.  7. Fluids/Electrolytes/Nutrition: Monitor I/O. Check lytes in am. Continue Vitamin B12 supplement 8. Acute on chronic CHF: On Coreg , lipitor, imdur and Lasix.  Monitor for signs of overload--monitor weights daily. Continue Lasix IV bid. Needs to get back to 174 lbs 9. CAD/CAF/ s/p AICD: Continues imdur, Coreg and lipitor. Monitor HR tid.  Vitals:   06/15/19 1947 06/16/19 0334  BP: (!) 100/57 (!) 143/68  Pulse: 71 84  Resp: 16 16  Temp: 98.3 F (36.8 C) 97.9 F (36.6 C)  SpO2: 95% 96%   10. Hypokalemia: Improved post supplement --likely due to increase in diuretics. Will add supplement.  11. ABLA: Will recheck in am.  12. Macular degeneration: Legally blind.   13. Insomnia: Trazodone 25mg  HS PRN  14.  Dysphagia-D2 thin liq  LOS: 2 days A FACE TO FACE EVALUATION WAS PERFORMED  Charlett Blake 06/16/2019, 7:26 AM

## 2019-06-17 ENCOUNTER — Inpatient Hospital Stay (HOSPITAL_COMMUNITY): Payer: Medicare Other | Admitting: Occupational Therapy

## 2019-06-17 ENCOUNTER — Inpatient Hospital Stay (HOSPITAL_COMMUNITY): Payer: Medicare Other | Admitting: Physical Therapy

## 2019-06-17 NOTE — Progress Notes (Signed)
Physical Therapy Session Note  Patient Details  Name: Mark Clayton MRN: [id number] Date of Birth: [birth year]  Today's Date: 06/17/2019 PT Individual Time: 0840-0950 PT Individual Time Calculation (min): 70 min   Short Term Goals: Week 1:  PT Short Term Goal 1 (Week 1): Pt will perform bed mobility with mod assist PT Short Term Goal 2 (Week 1): pt will transfe to [facility] with mod assist PT Short Term Goal 3 (Week 1): Pt will ambulate 56f with max assist of 1 PT Short Term Goal 4 (Week 1): Pt will propell WC 1028fwith min assist  Skilled Therapeutic Interventions/Progress Updates:   Pt received sitting on BSC and agreeable to PT. Pt able to finish void small BM. Sit<>[facility] for PT to perform pericare and clothing management; PT required to block RLE throughout to prevent buckling. [facility]at [facility] for improved pelvic rotation and RLE blocked.   Sit<>stand in parallel bars with mod assist to stabilize the RLE and improve symmetrical WB. In stance, BUE supported on Rail. BP taken in sitting 140/70 in standing 140/76. HR 115-130. pregait stepping to target with LLE and max assist to stabilize the R hip/knee. Max assist on the RLE to advance/return RLE and improve Weight shifting over the LLE. Mild pushers syndrome noted.   Nustep reciprocal movement training with max assist to initiate movement on the RLE, 3 min x 2 with prolonged rest break between bouts. Level 1-2.   WC mobility x 502fith supervision assist and moderate multimodal cues for coordination of LUE/LLE to maintain straight path.   Patient returned to room and left sitting in WC [facility] in reach and all needs met.            Therapy Documentation Precautions:  Precautions Precautions: Fall Precaution Comments: R hemiparesis Restrictions Weight Bearing Restrictions: No    Pain: denies   Therapy/Group: Individual Therapy  AusLorie Phenix20/2021,  12:10 PM

## 2019-06-17 NOTE — Progress Notes (Signed)
Mark Clayton PHYSICAL MEDICINE & REHABILITATION PROGRESS NOTE   Subjective/Complaints:  No issues overnite except itchy back , no limb itching or abd itching , improved wit hbenadryl   ROS-neg CP, SOB, N/V?D  Objective:   VAS [place] LOWER EXTREMITY VENOUS (DVT)  Result Date: 06/15/2019  Lower Venous DVTStudy Indications: Stroke. Other Indications: Immobility/ hemiparesis. Comparison Study: no prior Performing Technologist: Mark Clayton RVS  Examination Guidelines: A complete evaluation includes B-mode imaging, spectral Doppler, color Doppler, and power Doppler as needed of all accessible portions of each vessel. Bilateral testing is considered an integral part of a complete examination. Limited examinations for reoccurring indications may be performed as noted. The reflux portion of the exam is performed with the patient in reverse Trendelenburg.  +---------+---------------+---------+-----------+----------+--------------+ RIGHT    CompressibilityPhasicitySpontaneityPropertiesThrombus Aging +---------+---------------+---------+-----------+----------+--------------+ CFV      Full           Yes      Yes                                 +---------+---------------+---------+-----------+----------+--------------+ SFJ      Full                                                        +---------+---------------+---------+-----------+----------+--------------+ FV Prox  Full                                                        +---------+---------------+---------+-----------+----------+--------------+ FV Mid   Full                                                        +---------+---------------+---------+-----------+----------+--------------+ FV DistalFull                                                        +---------+---------------+---------+-----------+----------+--------------+ PFV      Full                                                         +---------+---------------+---------+-----------+----------+--------------+ POP      Full           Yes      Yes                                 +---------+---------------+---------+-----------+----------+--------------+ PTV      Full                                                        +---------+---------------+---------+-----------+----------+--------------+  PERO     Full                                                        +---------+---------------+---------+-----------+----------+--------------+   +---------+---------------+---------+-----------+----------+--------------+ LEFT     CompressibilityPhasicitySpontaneityPropertiesThrombus Aging +---------+---------------+---------+-----------+----------+--------------+ CFV      Full           Yes      Yes                                 +---------+---------------+---------+-----------+----------+--------------+ SFJ      Full                                                        +---------+---------------+---------+-----------+----------+--------------+ FV Prox  Full                                                        +---------+---------------+---------+-----------+----------+--------------+ FV Mid   Full                                                        +---------+---------------+---------+-----------+----------+--------------+ FV DistalFull                                                        +---------+---------------+---------+-----------+----------+--------------+ PFV      Full                                                        +---------+---------------+---------+-----------+----------+--------------+ POP      Full           Yes      Yes                                 +---------+---------------+---------+-----------+----------+--------------+ PTV      Full                                                         +---------+---------------+---------+-----------+----------+--------------+ PERO     Full                                                        +---------+---------------+---------+-----------+----------+--------------+  Summary: BILATERAL: - No evidence of deep vein thrombosis seen in the lower extremities, bilaterally.   *See table(s) above for measurements and observations. Electronically signed by Mark Snare MD on 06/15/2019 at 4:43:09 PM.    Final    Recent Labs    06/15/19 0725  WBC 8.6  HGB 13.0  HCT 39.1  PLT 183   Recent Labs    06/15/19 0725  NA 136  K 3.8  CL 99  CO2 23  GLUCOSE 140*  BUN 14  CREATININE 1.08  CALCIUM 8.8*    Intake/Output Summary (Last 24 hours) at 06/17/2019 [id number] Last data filed at 06/16/2019 2111 Gross per 24 hour  Intake 760 ml  Output 1000 ml  Net -240 ml     Physical Exam: Vital Signs Blood pressure 138/79, pulse 95, temperature 97.6 F (36.4 C), temperature source Oral, resp. rate 17, height 6' (1.829 m), weight 68.6 kg, SpO2 91 %.     General: No acute distress Mood and affect are appropriate Heart: Regular rate and rhythm no rubs murmurs or extra sounds Lungs: Clear to auscultation, breathing unlabored, no rales or wheezes Abdomen: Positive bowel sounds, soft nontender to palpation, nondistended Extremities: No clubbing, cyanosis, or edema Skin: No evidence of breakdown, no evidence of rash Motor 3- R grip trace bi/tri, 2- Right KE  Musculoskeletal: Full range of motion in all 4 extremities. No joint swelling   Assessment/Plan: 1. Functional deficits secondary to CVA L MCA and subdural  which require 3+ hours per day of interdisciplinary therapy in a comprehensive inpatient rehab setting.  Physiatrist is providing close team supervision and 24 hour management of active medical problems listed below.  Physiatrist and rehab team continue to assess barriers to discharge/monitor patient progress toward functional and  medical goals  Care Tool:  Bathing  Bathing activity did not occur: Refused Body parts bathed by patient: Right arm, Chest, Abdomen, Right upper leg, Left upper leg, Face   Body parts bathed by helper: Left arm Body parts n/a: Front perineal area, Buttocks, Right lower leg, Left lower leg   Bathing assist Assist Level: Moderate Assistance - Patient 50 - 74%     Upper Body Dressing/Undressing Upper body dressing   What is the patient wearing?: Pull over shirt    Upper body assist Assist Level: Maximal Assistance - Patient 25 - 49%    Lower Body Dressing/Undressing Lower body dressing      What is the patient wearing?: Incontinence brief     Lower body assist Assist for lower body dressing: Total Assistance - Patient < 25%     Toileting Toileting    Toileting assist Assist for toileting: Maximal Assistance - Patient 25 - 49%     Transfers Chair/bed transfer  Transfers assist     Chair/bed transfer assist level: Maximal Assistance - Patient 25 - 49%     Locomotion Ambulation   Ambulation assist   Ambulation activity did not occur: Safety/medical concerns          Walk 10 feet activity   Assist  Walk 10 feet activity did not occur: Safety/medical concerns        Walk 50 feet activity   Assist Walk 50 feet with 2 turns activity did not occur: Safety/medical concerns         Walk 150 feet activity   Assist Walk 150 feet activity did not occur: Safety/medical concerns         Walk 10 feet on uneven surface  activity  Assist Walk 10 feet on uneven surfaces activity did not occur: Safety/medical concerns         Wheelchair     Assist Will patient use wheelchair at discharge?: Yes(LT goals set)      Wheelchair assist level: Maximal Assistance - Patient 25 - 49% Max wheelchair distance: 75    Wheelchair 50 feet with 2 turns activity    Assist        Assist Level: Moderate Assistance - Patient 50 - 74%    Wheelchair 150 feet activity     Assist      Assist Level: Maximal Assistance - Patient 25 - 49%   Blood pressure 138/79, pulse 95, temperature 97.6 F (36.4 C), temperature source Oral, resp. rate 17, height 6' (1.829 m), weight 68.6 kg, SpO2 91 %.  Medical Problem List and Plan: 1.  Left parafalcine subdural hematoma and subarachnoid hemorrhage, Left MCA M3 branch embolic stroke  CIR evals today              -patient may shower             -ELOS/Goals: 2-3 weeks 2.  Antithrombotics: -DVT/anticoagulation:  Mechanical: Sequential compression devices, below knee Bilateral lower extremities--Follow up CT head in 2 weeks to decide on resuming AC.              -antiplatelet therapy: N/A 3. Pain Management: N/a 4. Mood: LCSW to follow for evaluation and support.              -antipsychotic agents: N/A 5. Neuropsych: This patient is capable of making decisions on his own behalf. 6. Skin/Wound Care: Routine pressure relief measures.  7. Fluids/Electrolytes/Nutrition: Monitor I/O. Check lytes in am. Continue Vitamin B12 supplement 8. Acute on chronic CHF: On Coreg , lipitor, imdur and Lasix.  Monitor for signs of overload--monitor weights daily. Continue Lasix IV bid. Needs to get back to 174 lbs Order daily weights may be able to change to po lasix  9. CAD/CAF/ s/p AICD: Continues imdur, Coreg and lipitor. Monitor HR tid.  Vitals:   06/16/19 1950 06/17/19 0436  BP: (!) 128/96 138/79  Pulse: 94 95  Resp: 17 17  Temp: 98 F (36.7 C) 97.6 F (36.4 C)  SpO2: 96% 91%   10. Hypokalemia: Improved post supplement -BMET normal on 3/18    11. ABLA: Will recheck in am.  12. Macular degeneration: Legally blind.   13. Insomnia: Trazodone 25mg  HS PRN  14.  Dysphagia-D2 thin liq  LOS: 3 days A FACE TO FACE EVALUATION WAS PERFORMED  Charlett Blake 06/17/2019, 8:08 AM

## 2019-06-17 NOTE — Addendum Note (Signed)
Addended by: Aviva Signs M on: 06/17/2019 11:57 AM   Modules accepted: Orders

## 2019-06-17 NOTE — Progress Notes (Signed)
Occupational Therapy Session Note  Patient Details  Name: Mark Clayton MRN: [id number] Date of Birth: 1941/03/25  Today's Date: 06/17/2019 OT Individual Time: 1420-1534 OT Individual Time Calculation (min): 74 min   Short Term Goals: Week 1:  OT Short Term Goal 1 (Week 1): Pt will perform LB dressing with mod A overall. OT Short Term Goal 2 (Week 1): Pt will perform UB dressing with mod A overall. OT Short Term Goal 3 (Week 1): Pt will perform toilet transfer with mod A overall. OT Short Term Goal 4 (Week 1): Pt will perform toileting with max A overall.  Skilled Therapeutic Interventions/Progress Updates:    Pt greeted in bed with no c/o pain at rest. Mod A for supine<sit for managing Rt side. Max A for squat pivot<w/c going towards Rt side with pushing tendencies noted in prep for transfer. Once IV was covered Mod-Max A for squat pivot<TTB with use of the grab bar. Pt required Min-Mod A for sitting balance during bathing tasks due to Rt lean and poor awareness. Pt able to lean laterally and forward for OT to wash buttocks. He also needed A to wash his feet and both arms. Pt at times reporting Rt foot cramping so OT assisted with repositioning as needed. Mod A for squat pivot out of shower going towards Lt side. Pt then completed bathing/dressing tasks sit<stand at the sink. Pt with more initiation to incorporate Rt arm, noted some proximal>distal activation with pt still needing HOH to use Rt UE functionally. Mod A for sit<stand while OT donned brief and elevated pants. Pt needing Max-Total A to dress LB, Max A also for overhead shirt. After combing his hair pt was agreeable to remain up in the w/c to increase OOB tolerance. Left him with call bell in lap and safety belt fastened.   Therapy Documentation Precautions:  Precautions Precautions: Fall Precaution Comments: R hemiparesis Restrictions Weight Bearing Restrictions: No ADL:       Therapy/Group: Individual Therapy  Teagyn Fishel A  Aubree Doody 06/17/2019, 4:17 PM

## 2019-06-18 ENCOUNTER — Inpatient Hospital Stay (HOSPITAL_COMMUNITY): Payer: Medicare Other | Admitting: Speech Pathology

## 2019-06-18 NOTE — Progress Notes (Signed)
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 No results for input(s): WBC, HGB, HCT, PLT in the last 72 hours. No results for input(s): NA, K, CL, CO2, GLUCOSE, BUN, CREATININE, CALCIUM in the last 72 hours.  Intake/Output Summary (Last 24 hours) at 06/18/2019 0902 Last data filed at 06/18/2019 0413 Gross per 24 hour  Intake 720 ml  Output 1400 ml  Net -680 ml     Physical Exam: Vital Signs Blood pressure 109/77, pulse 99, temperature 98.2 F (36.8 C), resp. rate 19, height 6' (1.829 m), weight 66.3 kg, SpO2 96 %.   General: No acute distress Mood and affect are appropriate Heart: Regular rate and rhythm no rubs murmurs or extra sounds Lungs: Clear to auscultation, breathing unlabored, no rales or wheezes Abdomen: Positive bowel sounds, soft nontender to palpation, nondistended Extremities: No clubbing, cyanosis, or edema Skin: No evidence of breakdown, no evidence of rash Neurologic: Cranial nerves II through XII intact, motor strength is 5/5 in Left 2-/5  RIght deltoid, bicep, tricep, grip, 5/5 Left and 3- Right HF, KE 2- right ankle Sensory exam normal  Musculoskeletal: Full range of motion in all 4 extremities. No joint swelling, arthritis changes in PIP and DIPs    Assessment/Plan: 1. Functional deficits secondary to CVA L MCA and subdural  which require 3+ hours per day of interdisciplinary therapy in a comprehensive inpatient rehab setting.  Physiatrist is providing close team supervision and 24 hour management of active medical problems listed below.  Physiatrist and rehab team continue to assess barriers to discharge/monitor patient progress toward functional and medical goals  Care Tool:  Bathing  Bathing activity did not occur: Refused Body parts bathed by  patient: Chest, Abdomen, Right upper leg, Left upper leg, Face, Front perineal area   Body parts bathed by helper: Right arm, Left arm, Buttocks, Right lower leg, Left lower leg Body parts n/a: Front perineal area, Buttocks, Right lower leg, Left lower leg   Bathing assist Assist Level: Maximal Assistance - Patient 24 - 49%     Upper Body Dressing/Undressing Upper body dressing   What is the patient wearing?: Pull over shirt    Upper body assist Assist Level: Maximal Assistance - Patient 25 - 49%    Lower Body Dressing/Undressing Lower body dressing      What is the patient wearing?: Incontinence brief, Pants     Lower body assist Assist for lower body dressing: Total Assistance - Patient < 25%     Toileting Toileting    Toileting assist Assist for toileting: Maximal Assistance - Patient 25 - 49%     Transfers Chair/bed transfer  Transfers assist     Chair/bed transfer assist level: Maximal Assistance - Patient 25 - 49%     Locomotion Ambulation   Ambulation assist   Ambulation activity did not occur: Safety/medical concerns          Walk 10 feet activity   Assist  Walk 10 feet activity did not occur: Safety/medical concerns        Walk 50 feet activity   Assist Walk 50 feet with 2 turns activity did not occur: Safety/medical concerns         Walk 150 feet activity   Assist Walk 150 feet activity did not occur: Safety/medical concerns  Walk 10 feet on uneven surface  activity   Assist Walk 10 feet on uneven surfaces activity did not occur: Safety/medical concerns         Wheelchair     Assist Will patient use wheelchair at discharge?: Yes(LT goals set)      Wheelchair assist level: Supervision/Verbal cueing Max wheelchair distance: 50    Wheelchair 50 feet with 2 turns activity    Assist        Assist Level: Supervision/Verbal cueing   Wheelchair 150 feet activity     Assist      Assist  Level: Maximal Assistance - Patient 25 - 49%   Blood pressure 109/77, pulse 99, temperature 98.2 F (36.8 C), resp. rate 19, height 6' (1.829 m), weight 66.3 kg, SpO2 96 %.  Medical Problem List and Plan: 1.  Left parafalcine subdural hematoma and subarachnoid hemorrhage, Left MCA M3 branch embolic stroke  CIR PT, OT              -patient may shower             -ELOS/Goals: 2-3 weeks 2.  Antithrombotics: -DVT/anticoagulation:  Mechanical: Sequential compression devices, below knee Bilateral lower extremities--Follow up CT head in 2 weeks to decide on resuming AC.              -antiplatelet therapy: N/A 3. Pain Management: N/a 4. Mood: LCSW to follow for evaluation and support.              -antipsychotic agents: N/A 5. Neuropsych: This patient is capable of making decisions on his own behalf. 6. Skin/Wound Care: Routine pressure relief measures.  7. Fluids/Electrolytes/Nutrition: Monitor I/O. Check lytes in am. Continue Vitamin B12 supplement 8. Acute on chronic CHF: On Coreg , lipitor, imdur and Lasix.  Monitor for signs of overload--monitor weights daily. Continue Lasix IV bid. Needs to get back to 174 lbs Order daily weights may be able to change to po lasix  9. CAD/CAF/ s/p AICD: Continues imdur, Coreg and lipitor. Monitor HR tid.  Vitals:   06/17/19 1938 06/18/19 0409  BP: (!) 101/50 109/77  Pulse: 88 99  Resp: 17 19  Temp: 98.3 F (36.8 C) 98.2 F (36.8 C)  SpO2: 96% 96%  Controlled 3/21 10. Hypokalemia: Improved post supplement -BMET normal on 3/18    11. ABLA: Will recheck in am.  12. Macular degeneration: Legally blind.   13. Insomnia: Trazodone 25mg  HS PRN  14.  Dysphagia-D2 thin liq  LOS: 4 days A FACE TO FACE EVALUATION WAS PERFORMED  Charlett Blake 06/18/2019, 9:02 AM

## 2019-06-18 NOTE — Progress Notes (Signed)
Speech Language Pathology Daily Session Note  Patient Details  Name: Mark Clayton MRN: [id number] Date of Birth: May 15, 1940  Today's Date: 06/18/2019 SLP Individual Time: 1115-1200 SLP Individual Time Calculation (min): 45 min  Short Term Goals: Week 1: SLP Short Term Goal 1 (Week 1): Pt will consume current diet with minimal s/sx aspiration and efficient mastication and oral clearance with use of recommended swallow stategies with Min A verbal/visual cues. SLP Short Term Goal 2 (Week 1): Pt will detect and correct semantic and/or phonemic errors with Min A multimodal cues. SLP Short Term Goal 3 (Week 1): Pt will demonstrate ability to use word-finding strategies in conversation with Min A cues. SLP Short Term Goal 4 (Week 1): Pt will use increased vocal intensity to achieve 90% intellgibility at the converstaion level with Min A cues. SLP Short Term Goal 5 (Week 1): Pt will susatin attention to tasks with Mod A verbal cues for redirection.  Skilled Therapeutic Interventions: Patient received skilled SLP services targeting expressive language goals. Patient participated in a functional divergent naming task creating a grocery list of 15 items he needs at the store. In a structured conversation regarding the recipe ingredients and steps to creating his pound cake patient required mod verbal cues to implement use of word finding strategies. Mod verbal cues were needed to identify and correct semantic errors during recipe task. Patient endorsed "if I slow down my words can come out better". Patient provided 2 attributes about each of his family members with min verbal cues. At the end of therapy session patient was upright in bed, bed alarm activated, and all needs within reach.  Pain Pain Assessment Pain Scale: 0-10 Pain Score: 0-No pain  Therapy/Group: Individual Therapy  Cristy Folks 06/18/2019, 12:14 PM

## 2019-06-19 ENCOUNTER — Inpatient Hospital Stay (HOSPITAL_COMMUNITY): Payer: Medicare Other | Admitting: Physical Therapy

## 2019-06-19 ENCOUNTER — Encounter (HOSPITAL_COMMUNITY): Payer: Medicare Other | Admitting: Psychology

## 2019-06-19 ENCOUNTER — Inpatient Hospital Stay (HOSPITAL_COMMUNITY): Payer: Medicare Other | Admitting: Speech Pathology

## 2019-06-19 DIAGNOSIS — I6340 Cerebral infarction due to embolism of unspecified cerebral artery: Secondary | ICD-10-CM

## 2019-06-19 LAB — BASIC METABOLIC PANEL
Anion gap: 12 (ref 5–15)
BUN: 16 mg/dL (ref 8–23)
CO2: 27 mmol/L (ref 22–32)
Calcium: 9.3 mg/dL (ref 8.9–10.3)
Chloride: 99 mmol/L (ref 98–111)
Creatinine, Ser: 1.18 mg/dL (ref 0.61–1.24)
GFR calc Af Amer: >60 mL/min (ref >60)
GFR calc non Af Amer: 59 mL/min — ABNORMAL LOW (ref >60)
Glucose, Bld: 117 mg/dL — ABNORMAL HIGH (ref 70–99)
Potassium: 3.6 mmol/L (ref 3.5–5.1)
Sodium: 138 mmol/L (ref 135–145)

## 2019-06-19 LAB — CBC
HCT: 40.5 % (ref 39.0–52.0)
Hemoglobin: 13.1 g/dL (ref 13.0–17.0)
MCH: 28.4 pg (ref 26.0–34.0)
MCHC: 32.3 g/dL (ref 30.0–36.0)
MCV: 87.9 fL (ref 80.0–100.0)
Platelets: 234 10*3/uL (ref 150–400)
RBC: 4.61 MIL/uL (ref 4.22–5.81)
RDW: 14.9 % (ref 11.5–15.5)
WBC: 6.5 10*3/uL (ref 4.0–10.5)
nRBC: 0 % (ref 0.0–0.2)

## 2019-06-19 MED ORDER — TRIAMCINOLONE ACETONIDE 0.1 % EX CREA
TOPICAL_CREAM | Freq: Two times a day (BID) | CUTANEOUS | Status: DC
  Administered 2019-06-30: 1 via TOPICAL
  Filled 2019-06-19: qty 15

## 2019-06-19 MED ORDER — FUROSEMIDE 40 MG PO TABS
40.0000 mg | ORAL_TABLET | Freq: Every day | ORAL | Status: DC
  Administered 2019-06-19 – 2019-07-07 (×19): 40 mg via ORAL
  Filled 2019-06-19 (×20): qty 1

## 2019-06-19 MED ORDER — CAMPHOR-MENTHOL 0.5-0.5 % EX LOTN
TOPICAL_LOTION | Freq: Two times a day (BID) | CUTANEOUS | Status: DC
  Filled 2019-06-19: qty 222

## 2019-06-19 NOTE — Progress Notes (Signed)
Patient currently hospitalized.  ICM remote transmission rescheduled for 07/03/2019.

## 2019-06-19 NOTE — Progress Notes (Signed)
Physical Therapy Session Note  Patient Details  Name: Mark Clayton MRN: [id number] Date of Birth: [birth year]  Today's Date: 06/19/2019 PT Individual Time: 1050-1200 PT Individual Time Calculation (min): 70 min   Short Term Goals: Week 1:  PT Short Term Goal 1 (Week 1): Pt will perform bed mobility with mod assist PT Short Term Goal 2 (Week 1): pt will transfe to [facility] [address] with mod assist PT Short Term Goal 3 (Week 1): Pt will ambulate 32f with max assist of 1 PT Short Term Goal 4 (Week 1): Pt will propell WC 1056fwith min assist  Skilled Therapeutic Interventions/Progress Updates: Pt presented in w/c agreeable to therapy with encouragement. Pt c/o RLE pain which he contributed to sitting in w/c for extended period of time. Pt transported to day room and PTA performed PROM followed by seated hamstring stretch 1 min x 3. Pt then participated in Cybex Kinetron 90cm/sec for reciprocal movement and activation of hamstrings. Pt initially required AAROM for full range however was able to progress to AROM 20 cycles x 3. Pt transported to rehab gym and participated in STS in parallel bars x 5 with pt pulling self up but requiring minA. Participated in standing TKE with RLE x 10 for forced use. Performed pre-gait stepping forward /backwards with LLE with PTA blocking R knee and pt requiring max cues for task due to vision impairments with pt becoming increasingly anxious due to not being able to see foot. Pt attempted gait in parallel bats however c/o increased dizziness when standing. Participated in w/c mobility x 10058fith supervision and verbal cues for direction and maintaining straight trajectory. PTA transported pt remaining distance to room and performed stand pivot transfer to bed with modA to L. Pt required modA for sit to supine primarly for BLE management. Pt positioned for comfort and left with call bell within reach, bed alarm set, and current needs met.      Therapy Documentation Precautions:   Precautions Precautions: Fall Precaution Comments: R hemiparesis Restrictions Weight Bearing Restrictions: No General:   Vital Signs: Therapy Vitals Temp: 98.5 F (36.9 C) Temp Source: Oral Pulse Rate: 99 Resp: 18 BP: 123/89 Patient Position (if appropriate): Lying Oxygen Therapy SpO2: 98 % O2 Device: Room Air Pain:   Mobility:   Locomotion :    Trunk/Postural Assessment :    Balance:   Exercises:   Other Treatments:      Therapy/Group: Individual Therapy  [name] 06/19/2019, 4:37 PM

## 2019-06-19 NOTE — Progress Notes (Signed)
[place] PHYSICAL MEDICINE & REHABILITATION PROGRESS NOTE   Subjective/Complaints: Complains of itching this morning and has erythema and scratch marks on back. Did not sleep well due to itching. Added Sarna lotion and triamcinolone ointment BID. He received Benadryl at 2am.  Denies pain, constipation.   ROS-neg CP, SOB, N/V/D  Objective:   No results found. Recent Labs    06/19/19 0530  WBC 6.5  HGB 13.1  HCT 40.5  PLT 234   Recent Labs    06/19/19 0530  NA 138  K 3.6  CL 99  CO2 27  GLUCOSE 117*  BUN 16  CREATININE 1.18  CALCIUM 9.3    Intake/Output Summary (Last 24 hours) at 06/19/2019 0955 Last data filed at 06/19/2019 0529 Gross per 24 hour  Intake 60 ml  Output 950 ml  Net -890 ml     Physical Exam: Vital Signs Blood pressure 123/83, pulse 86, temperature 97.7 F (36.5 C), temperature source Oral, resp. rate 17, height 6' (1.829 m), weight 67.5 kg, SpO2 96 %.   General: No acute distress Mood and affect are appropriate Heart: Regular rate and rhythm no rubs murmurs or extra sounds Lungs: Clear to auscultation, breathing unlabored, no rales or wheezes Abdomen: Positive bowel sounds, soft nontender to palpation, nondistended Extremities: No clubbing, cyanosis, or edema Skin: No evidence of breakdown, erythema and scratch marks on back.  Neurologic: Cranial nerves II through XII intact, motor strength is 5/5 in Left 2-/5  RIght deltoid, bicep, tricep, grip, 5/5 Left and 3- Right HF, KE 2- right ankle Sensory exam normal  Musculoskeletal: Full range of motion in all 4 extremities. No joint swelling, arthritis changes in PIP and DIPs    Assessment/Plan: 1. Functional deficits secondary to CVA L MCA and subdural  which require 3+ hours per day of interdisciplinary therapy in a comprehensive inpatient rehab setting.  Physiatrist is providing close team supervision and 24 hour management of active medical problems listed below.  Physiatrist and rehab team  continue to assess barriers to discharge/monitor patient progress toward functional and medical goals  Care Tool:  Bathing  Bathing activity did not occur: Refused Body parts bathed by patient: Chest, Abdomen, Right upper leg, Left upper leg, Face, Front perineal area   Body parts bathed by helper: Right arm, Left arm, Buttocks, Right lower leg, Left lower leg Body parts n/a: Front perineal area, Buttocks, Right lower leg, Left lower leg   Bathing assist Assist Level: Maximal Assistance - Patient 24 - 49%     Upper Body Dressing/Undressing Upper body dressing   What is the patient wearing?: Pull over shirt    Upper body assist Assist Level: Maximal Assistance - Patient 25 - 49%    Lower Body Dressing/Undressing Lower body dressing      What is the patient wearing?: Incontinence brief, Pants     Lower body assist Assist for lower body dressing: Total Assistance - Patient < 25%     Toileting Toileting    Toileting assist Assist for toileting: Maximal Assistance - Patient 25 - 49%     Transfers Chair/bed transfer  Transfers assist     Chair/bed transfer assist level: Maximal Assistance - Patient 25 - 49%     Locomotion Ambulation   Ambulation assist   Ambulation activity did not occur: Safety/medical concerns          Walk 10 feet activity   Assist  Walk 10 feet activity did not occur: Safety/medical concerns  Walk 50 feet activity   Assist Walk 50 feet with 2 turns activity did not occur: Safety/medical concerns         Walk 150 feet activity   Assist Walk 150 feet activity did not occur: Safety/medical concerns         Walk 10 feet on uneven surface  activity   Assist Walk 10 feet on uneven surfaces activity did not occur: Safety/medical concerns         Wheelchair     Assist Will patient use wheelchair at discharge?: Yes(LT goals set)      Wheelchair assist level: Supervision/Verbal cueing Max wheelchair  distance: 50    Wheelchair 50 feet with 2 turns activity    Assist        Assist Level: Supervision/Verbal cueing   Wheelchair 150 feet activity     Assist      Assist Level: Maximal Assistance - Patient 25 - 49%   Blood pressure 123/83, pulse 86, temperature 97.7 F (36.5 C), temperature source Oral, resp. rate 17, height 6' (1.829 m), weight 67.5 kg, SpO2 96 %.  Medical Problem List and Plan: 1.  Left parafalcine subdural hematoma and subarachnoid hemorrhage, Left MCA M3 branch embolic stroke  CIR PT, OT              -patient may shower             -ELOS/Goals: 2-3 weeks 2.  Antithrombotics: -DVT/anticoagulation:  Mechanical: Sequential compression devices, below knee Bilateral lower extremities--Follow up CT head in 2 weeks to decide on resuming AC.              -antiplatelet therapy: N/A 3. Pain Management: N/a 4. Mood: LCSW to follow for evaluation and support.              -antipsychotic agents: N/A 5. Neuropsych: This patient is capable of making decisions on his own behalf. 6. Skin/Wound Care: Routine pressure relief measures.  7. Fluids/Electrolytes/Nutrition: Monitor I/O. Check lytes in am. Continue Vitamin B12 supplement 8. Acute on chronic CHF: On Coreg , lipitor, imdur and Lasix.  Monitor for signs of overload--monitor weights daily. Decreased IV Lasix to QD.  Order daily weights may be able to change to po lasix  9. CAD/CAF/ s/p AICD: Continues imdur, Coreg and lipitor. Monitor HR tid.  Vitals:   06/18/19 1926 06/19/19 0526  BP: 105/77 123/83  Pulse: 80 86  Resp: 17 17  Temp: 98.3 F (36.8 C) 97.7 F (36.5 C)  SpO2: 96% 96%  Controlled 3/21, 3/22 10. Hypokalemia: Improved post supplement -BMET normal on 3/18 and on 3/22    11. ABLA: Will recheck in am.  12. Macular degeneration: Legally blind.   13. Insomnia: Trazodone 25mg  HS PRN  14.  Dysphagia-D2 thin liq  15. Heat rash: Has benadryl prn; used at 2am last night. Added Sarna lotion and  Triamcinolone ointment BID.  LOS: 5 days A FACE TO FACE EVALUATION WAS PERFORMED  Clide Deutscher Karriem Muench 06/19/2019, 9:55 AM

## 2019-06-19 NOTE — Consult Note (Signed)
Neuropsychological Consultation   Patient:   Mark Clayton   DOB:   1941-01-13  MR Number:  [id number]  Location:  [facility] [address] C[facility] [address] [place] [id number] [employer] [id number] Dept: [place]: [phone]           Date of Service:   06/19/2019  Start Time:   9:30 AM End Time:   10:30 AM  Provider/Observer:  Ilean Skill, Psy.D.       Clinical Neuropsychologist       Billing Code/Service: [id number]  Chief Complaint:    Mark Clayton is a 79 year old male who has a history of colon cancer, chronic systolic congestive heart failure, PAF, anemia, macular degeneration, LV thrombosis.  The patient was admitted on 06/06/2019 for work-up after falling backward in his yard and striking his head.  The patient was developing left-sided weakness.  Head CT showed left hemispheric subdural hematoma.  CTA showed branch occlusion left M3 with trickle flow corresponding acute infarct left parietal lobe with surrounding penumbra.  Neurology felt left MCA infarct was embolic due to A. fib and the subdural hematoma was subsequent to his fall.  The patient has been a very active individual with many skills and activities throughout his life.  Sudden reduction in functioning has produced a great deal of coping issues but his lifelong motivation and effort levels are being quite helpful for him during therapeutic interventions.  Reason for Service:  The patient was referred for neuropsychological evaluation due to coping and adjustment issues.  Below is the HPI for the current admission.  HPI:  Mark Clayton is a 79 year old male with history of colon cancer, Chronic systolic CHF with AICD, PAF, anemia, macular degeneration--legally blind, LV thrombus in the past who was admitted on  06/06/19 for work-up after falling backwards in the yard, striking his head and developing left-sided weakness.  CT head showed left hemispheric SDH  therefore Xarelto was reversed with Kcentra.  CTA/perfusion of head/neck showed branch occlusion left M3 with trickle flow corresponding acute infarct left parietal lobe with surrounding penumbra.  Neurology felt left-MCA infarct was embolic due to A. fib and no antithrombotic due to hemorrhage.  Dr. Trenton Gammon recommended conservative management with serial CT head for monitoring.  BP was elevated at admission and has been treated with Cleviprex.  2D echo showed global hypokinesis with EF of 20%, aortic sclerosis without stenosis and moderately elevated pulmonary pressures.  Current Status:  The patient reports that overall his mood is generally stable and he is in good spirits.  The patient reports that he continues to be quite motivated.  He was oriented today and was able to carry on a conversation.  The patient had significant expressive aphasia symptoms initially and while he continues to have difficulty with longer sentence structure he reports that if he slows down and thinks through what he is going to say he is able to adequately express himself.  The patient denies any significant memory issues and reports that his primary motor deficits are for his right leg with some improving but motor deficits for right hand.  Behavioral Observation: Mark Clayton  presents as a 79 y.o.-year-old Right Caucasian Male who appeared his stated age. his dress was Appropriate and he was Well Groomed and his manners were Appropriate to the situation.  his participation was indicative of Appropriate and Redirectable behaviors.  There were any physical disabilities noted.  he displayed an  appropriate level of cooperation and motivation.     Interactions:    Active Appropriate and Redirectable  Attention:   abnormal and attention span appeared shorter than expected for age  Memory:   within normal limits; recent and remote memory intact  Visuo-spatial:  not examined  Speech (Volume):  normal  Speech:   normal; some  difficulties with expressive language functions with difficulty particularly in more complex sentence structure and with verbal fluency.  Thought Process:  Coherent and Relevant  Though Content:  WNL; not suicidal and not homicidal  Orientation:   person, place, time/date and situation  Judgment:   Good  Planning:   Fair  Affect:    Appropriate  Mood:    Euthymic  Insight:   Good  Intelligence:   high  Medical History:   Past Medical History:  Diagnosis Date  . AICD (automatic cardioverter/defibrillator) present 01/17/2003   [employer] Maximo 7232CX ICD, serial [id number] S  . Anemia 02-06-11   takes oral iron  . Arthritis    hands, knees  . CAD (coronary artery disease) 2003   a. h/o MI and CABG in 2003. b. s/p DES to SVG-RPDA-RPLB in 08/2014.  Marland Kitchen Cancer of sigmoid colon ([place]) 2012   a. s/p colon surgery.  . Carotid bruit   . Chronic systolic CHF (congestive heart failure) (HCC)    a. EF 20% in 2014; b. 08/2017 Echo: EF 20-25%, diff HK, Gr3 DD. Triv AI. Mod MR. Sev dil LA. Mildly dil RV w/ mildly reduced RV fxn. Mildly dil RA. Mod TR. PASP 69mHg.  Marland Kitchen Cough   . GERD (gastroesophageal reflux disease) 02-06-11  . HTN (hypertension)   . Hyperlipidemia   . Ischemic cardiomyopathy    a. EF 20% in 2014. (Master study EF >20%); b. 08/2017 Echo: EF 20-25%, diff HK. Gr3 DD.  . LV (left ventricular) mural thrombus    a. 12/2012 Echo: EF 20% with mural thrombus No evidence of thrombus on 08/2017 echo.  . Macular degeneration   . Myocardial infarct ([place])    2003  . PAF (paroxysmal atrial fibrillation) (HCC)    a. CHA2DS2VASc = 5-->Xarelto/Tikosyn.    Psychiatric History:  The patient denies any prior psychiatric issues.  Family Med/Psych History:  Family History  Problem Relation Age of Onset  . Hypertension Father   . Hyperlipidemia Father   . Heart disease Father   . Prostate cancer Father   . Alzheimer's disease Mother   . Hypertension Sister   . Hyperlipidemia Sister   .  Colon cancer Neg Hx   . Esophageal cancer Neg Hx   . Rectal cancer Neg Hx   . Stomach cancer Neg Hx    Impression/DX:  Mark Clayton is a 79 year old male who has a history of colon cancer, chronic systolic congestive heart failure, PAF, anemia, macular degeneration, LV thrombosis.  The patient was admitted on 06/06/2019 for work-up after falling backward in his yard and striking his head.  The patient was developing left-sided weakness.  Head CT showed left hemispheric subdural hematoma.  CTA showed branch occlusion left M3 with trickle flow corresponding acute infarct left parietal lobe with surrounding penumbra.  Neurology felt left MCA infarct was embolic due to A. fib and the subdural hematoma was subsequent to his fall.  The patient has been a very active individual with many skills and activities throughout his life.  Sudden reduction in functioning has produced a great deal of coping issues but his lifelong motivation and effort levels  are being quite helpful for him during therapeutic interventions.  The patient reports that overall his mood is generally stable and he is in good spirits.  The patient reports that he continues to be quite motivated.  He was oriented today and was able to carry on a conversation.  The patient had significant expressive aphasia symptoms initially and while he continues to have difficulty with longer sentence structure he reports that if he slows down and thinks through what he is going to say he is able to adequately express himself.  The patient denies any significant memory issues and reports that his primary motor deficits are for his right leg with some improving but motor deficits for right hand.  Disposition/Plan:  Today we worked on coping and adjustment issues following his left hemisphere stroke and subsequent subdural hematoma post fall due to stroke.  I will follow up with the patient first of next week.  He remains quite motivated and actively participated  in the therapeutic process and we were able to work on coping strategies and develop a plan for his maintaining effort motivation throughout rehab and once he is discharged home.  Diagnosis:    Cerebrovascular accident (CVA) due to embolism of cerebral artery [facility]) - Plan: Ambulatory referral to Physical Medicine Rehab         Electronically Signed   _______________________ Ilean Skill, Psy.D.

## 2019-06-19 NOTE — Progress Notes (Signed)
Occupational Therapy Session Note  Patient Details  Name: Mark Clayton MRN: [id number] Date of Birth: 1940-10-19  Today's Date: 06/19/2019 OT Individual Time: 0800-0900 OT Individual Time Calculation (min): 60 min    Short Term Goals: Week 1:  OT Short Term Goal 1 (Week 1): Pt will perform LB dressing with mod A overall. OT Short Term Goal 2 (Week 1): Pt will perform UB dressing with mod A overall. OT Short Term Goal 3 (Week 1): Pt will perform toilet transfer with mod A overall. OT Short Term Goal 4 (Week 1): Pt will perform toileting with max A overall.  Skilled Therapeutic Interventions/Progress Updates:  Pt received from NT having just assisted pt with breakfast. Pt agreeable to OT intervention. Pt required MOD- MAX A for bed mobility needing most assist to advance RLE to EOB and elevate trunk into sitting. Pt transferred EOB>w/c towards pts L side via swuat pivot transfer with MODA with RLE blocked. Pt completed seated UB/LB bathing at sink from w/c. Pt required cues to utilize RUE during bathing. Once cued, noted improved functional usage of RUE with pt able to wash L arm with RUE and open deodorant with L hand using R hand as stabilizer. Pt required MIN A UB dressing needing cues to orient shirt and to recall compensatory method of dressing RUE first. MAX A for LB dressing needing MODA x2 sit<>stands to pull pants and brief up to waist line. Pt completed seated grooming tasks from w/c needing MIN A to apply paste to toothbrush d/t visual deficits as pt reports unable to see brush. Pt transferred back to bed via squat pivot transfer to pts R side with MAX A. Pt left supine in bed with bed alarm activated and all needs within reach.   Therapy Documentation Precautions:  Precautions Precautions: Fall Precaution Comments: R hemiparesis Restrictions Weight Bearing Restrictions: No General:   Vital Signs:   Pain: Pt reports no pain during session.    Therapy/Group: Individual  Therapy  [name] 06/19/2019, 12:14 PM

## 2019-06-19 NOTE — Progress Notes (Signed)
Speech Language Pathology Daily Session Note  Patient Details  Name: Mark Clayton MRN: [id number] Date of Birth: [birth year]  Today's Date: 06/19/2019 SLP Individual Time: 1330-1400 SLP Individual Time Calculation (min): 30 min  Short Term Goals: Week 1: SLP Short Term Goal 1 (Week 1): Pt will consume current diet with minimal s/sx aspiration and efficient mastication and oral clearance with use of recommended swallow stategies with Min A verbal/visual cues. SLP Short Term Goal 2 (Week 1): Pt will detect and correct semantic and/or phonemic errors with Min A multimodal cues. SLP Short Term Goal 3 (Week 1): Pt will demonstrate ability to use word-finding strategies in conversation with Min A cues. SLP Short Term Goal 4 (Week 1): Pt will use increased vocal intensity to achieve 90% intellgibility at the converstaion level with Min A cues. SLP Short Term Goal 5 (Week 1): Pt will susatin attention to tasks with Mod A verbal cues for redirection.  Skilled Therapeutic Interventions:  Skilled treatment session targeted pt's communication goals. SLP facilitated session by Min A verbal cues for pt to self-monitor verbal communication for phonemic errors. With cues, pt able to immediately self-correct. Despite errors, pt's communication was effective and able to be understood. Pt left upright in bed, bed alarm on and all needs within reach.      Pain    Therapy/Group: Individual Therapy  Thanh Mottern 06/19/2019, 3:21 PM

## 2019-06-20 ENCOUNTER — Inpatient Hospital Stay (HOSPITAL_COMMUNITY): Payer: Medicare Other

## 2019-06-20 ENCOUNTER — Inpatient Hospital Stay (HOSPITAL_COMMUNITY): Payer: Medicare Other | Admitting: Speech Pathology

## 2019-06-20 ENCOUNTER — Inpatient Hospital Stay (HOSPITAL_COMMUNITY): Payer: Medicare Other | Admitting: Physical Therapy

## 2019-06-20 NOTE — Progress Notes (Signed)
Patient c/o shortness of breath when he awaken O2 stats 100%on RA Hr 88 Lungs sounds clear. Arthor Captain LPN

## 2019-06-20 NOTE — Progress Notes (Signed)
Physical Therapy Session Note  Patient Details  Name: Mark Clayton MRN: [id number] Date of Birth: 09-30-1940  Today's Date: 06/20/2019 PT Individual Time: 1000-1100 PT Individual Time Calculation (min): 60 min   Short Term Goals: Week 1:  PT Short Term Goal 1 (Week 1): Pt will perform bed mobility with mod assist PT Short Term Goal 2 (Week 1): pt will transfe to [name] with mod assist PT Short Term Goal 3 (Week 1): Pt will ambulate 52f with max assist of 1 PT Short Term Goal 4 (Week 1): Pt will propell WC 1061fwith min assist  Skilled Therapeutic Interventions/Progress Updates: Pt presented in bed agreeable to therapy. Pt indicated did not have a restful night as had reaction to clothing yesterday. Pt agreeable to wearing paper scrubs. Pt noted to have condom cath which PTA removed. Pt performed supine to sit EOB with use of bed features and modA. At EOB PTA threaded pants and donned shirt and shoes maxA for time management. Pt performed STS minA with RW to allow PTA to pull pants over hips. Pt performed stand pivot to w/c with modA. Pt transported to rehab gym and performed squat pivot to modA. Performed STS x 5 with RW from slightly elevated mat (same height as w/c) with mod fading to minA. PTA placed emphasis on pt pushing up from mat and attempting to reach/grip RW with R hand. Pt then performed x 2 bouts of horseshoes in standing with RW with LUE for static standing balance. Pt with intermittent bouts with posterior lean which he was was able to correct with min multimodal cues. Performed squat pivot transfer to w/c and transported back to room. Per pt request returned to bed via stand pivot and pt holding onto bed rail. Pt required modA for sit to supine with use of bed features. Pt repositioned to comfort and left with bed alarm on, call bell within reach and needs met.      Therapy Documentation Precautions:  Precautions Precautions: Fall Precaution Comments: R  hemiparesis Restrictions Weight Bearing Restrictions: No General:   Vital Signs: Therapy Vitals Temp: 98 F (36.7 C) Pulse Rate: 76 Resp: 18 BP: 118/80 Patient Position (if appropriate): Sitting Oxygen Therapy SpO2: 99 % O2 Device: Room Air   Therapy/Group: Individual Therapy  Micheal Murad  Teressa Mcglocklin, PTA  06/20/2019, 4:16 PM

## 2019-06-20 NOTE — Progress Notes (Signed)
Occupational Therapy Session Note  Patient Details  Name: Mark Clayton MRN: [id number] Date of Birth: [birth year]  Today's Date: 06/20/2019 OT Individual Time: 0125-0200 OT Individual Time Calculation (min): 35 min  and Today's Date: 06/20/2019 OT Missed Time: 25 Minutes Missed Time Reason: Unavailable (comment)(pt eating lunch)   Short Term Goals: Week 1:  OT Short Term Goal 1 (Week 1): Pt will perform LB dressing with mod A overall. OT Short Term Goal 2 (Week 1): Pt will perform UB dressing with mod A overall. OT Short Term Goal 3 (Week 1): Pt will perform toilet transfer with mod A overall. OT Short Term Goal 4 (Week 1): Pt will perform toileting with max A overall.  Skilled Therapeutic Interventions/Progress Updates:  Pt eating lunch upon OTA arrival. Allowed pt to finish lunch and returned 25 mins later with pt asleep upon arrival.Pt easily able to arouse agreeable to intervention. Pt required MOD- MAX A for bed mobility but able to use LLE to hook RLE to advance BLEs to EOB. Pt required MOD A to elevate trunk and needed MODA initially for sitting balance. Pt required MAX A for LB dressing needing assist to thread RLE into pants. Pt reports "I don't wear long pants at home anyway." Pt required MOD A for sit<>stand from EOB and able to pull pants up with MAX A for standing balance. Pt required total A to don R shoe but needed supervision- set-up to don L shoe. Pt required MOD A for squat pivot transfer to w/c to pts L side. Pt transported to day room from w/c with total A. Attempted work on functional sit<>stands to increase standing tolerance for BADL participation and NMR ; however, pt completed 1 sit<>stand with MODA reporting increased pain in RLE needing to return to sitting. Pt transported back to room with total A where completed squat pivot transfer to pts L side with MODA. Pt returned to supine with MAX A needing assist to elevate BLEs back to supine. Pt left supine in bed with all  needs within reach and bed alarm activated.   Therapy Documentation Precautions:  Precautions Precautions: Fall Precaution Comments: R hemiparesis Restrictions Weight Bearing Restrictions: No General: General OT Amount of Missed Time: 25 Minutes Vital Signs:   Pain: Pt reports pain in RLE during sit<>stand offered increased seated rest breaks as pain mgmt strategy.   Therapy/Group: Individual Therapy  [name] 06/20/2019, 3:06 PM

## 2019-06-20 NOTE — Progress Notes (Signed)
[place] PHYSICAL MEDICINE & REHABILITATION PROGRESS NOTE   Subjective/Complaints:  Appreciate neuropsych note Pt frustrated that , wants to call family member but cannot do it independantly  ROS-neg CP, SOB, N/V/D  Objective:   No results found. Recent Labs    06/19/19 0530  WBC 6.5  HGB 13.1  HCT 40.5  PLT 234   Recent Labs    06/19/19 0530  NA 138  K 3.6  CL 99  CO2 27  GLUCOSE 117*  BUN 16  CREATININE 1.18  CALCIUM 9.3    Intake/Output Summary (Last 24 hours) at 06/20/2019 0734 Last data filed at 06/20/2019 0525 Gross per 24 hour  Intake 120 ml  Output 450 ml  Net -330 ml     Physical Exam: Vital Signs Blood pressure 127/90, pulse 82, temperature 97.8 F (36.6 C), temperature source Oral, resp. rate 18, height 6' (1.829 m), weight 67.7 kg, SpO2 100 %.   General: No acute distress Mood and affect are appropriate Heart: Regular rate and rhythm no rubs murmurs or extra sounds Lungs: Clear to auscultation, breathing unlabored, no rales or wheezes Abdomen: Positive bowel sounds, soft nontender to palpation, nondistended Extremities: No clubbing, cyanosis, or edema Skin: No evidence of breakdown, erythema and scratch marks on back.  Neurologic: Cranial nerves II through XII intact, motor strength is 5/5 in Left 2-/5  RIght deltoid, bicep, tricep, grip, 5/5 Left and 3- Right HF, KE 2- right ankle Sensory exam normal  Musculoskeletal: Full range of motion in all 4 extremities. No joint swelling, arthritis changes in PIP and DIPs    Assessment/Plan: 1. Functional deficits secondary to CVA L MCA and subdural  which require 3+ hours per day of interdisciplinary therapy in a comprehensive inpatient rehab setting.  Physiatrist is providing close team supervision and 24 hour management of active medical problems listed below.  Physiatrist and rehab team continue to assess barriers to discharge/monitor patient progress toward functional and medical goals  Care  Tool:  Bathing  Bathing activity did not occur: Refused Body parts bathed by patient: Chest, Abdomen, Right upper leg, Left upper leg, Face, Front perineal area, Right arm, Left arm, Buttocks, Right lower leg, Left lower leg   Body parts bathed by helper: Right arm, Left arm, Buttocks, Right lower leg, Left lower leg Body parts n/a: Front perineal area, Buttocks, Right lower leg, Left lower leg   Bathing assist Assist Level: Moderate Assistance - Patient 50 - 74%     Upper Body Dressing/Undressing Upper body dressing   What is the patient wearing?: Pull over shirt    Upper body assist Assist Level: Maximal Assistance - Patient 25 - 49%    Lower Body Dressing/Undressing Lower body dressing      What is the patient wearing?: Incontinence brief, Pants     Lower body assist Assist for lower body dressing: Maximal Assistance - Patient 25 - 49%     Toileting Toileting    Toileting assist Assist for toileting: Maximal Assistance - Patient 25 - 49%     Transfers Chair/bed transfer  Transfers assist     Chair/bed transfer assist level: Maximal Assistance - Patient 25 - 49%     Locomotion Ambulation   Ambulation assist   Ambulation activity did not occur: Safety/medical concerns          Walk 10 feet activity   Assist  Walk 10 feet activity did not occur: Safety/medical concerns        Walk 50 feet activity  Assist Walk 50 feet with 2 turns activity did not occur: Safety/medical concerns         Walk 150 feet activity   Assist Walk 150 feet activity did not occur: Safety/medical concerns         Walk 10 feet on uneven surface  activity   Assist Walk 10 feet on uneven surfaces activity did not occur: Safety/medical concerns         Wheelchair     Assist Will patient use wheelchair at discharge?: Yes(LT goals set)      Wheelchair assist level: Supervision/Verbal cueing Max wheelchair distance: 50    Wheelchair 50 feet with  2 turns activity    Assist        Assist Level: Supervision/Verbal cueing   Wheelchair 150 feet activity     Assist      Assist Level: Maximal Assistance - Patient 25 - 49%   Blood pressure 127/90, pulse 82, temperature 97.8 F (36.6 C), temperature source Oral, resp. rate 18, height 6' (1.829 m), weight 67.7 kg, SpO2 100 %.  Medical Problem List and Plan: 1.  Left parafalcine subdural hematoma and subarachnoid hemorrhage, Left MCA M3 branch embolic stroke  CIR PT, OT - team conf in am              -patient may shower             -ELOS/Goals: 2-3 weeks 2.  Antithrombotics: -DVT/anticoagulation:  Mechanical: Sequential compression devices, below knee Bilateral lower extremities--Follow up CT head in 2 weeks to decide on resuming AC.              -antiplatelet therapy: N/A 3. Pain Management: N/a 4. Mood: LCSW to follow for evaluation and support.              -antipsychotic agents: N/A 5. Neuropsych: This patient is capable of making decisions on his own behalf. 6. Skin/Wound Care: Routine pressure relief measures.  7. Fluids/Electrolytes/Nutrition: Monitor I/O. Check lytes in am. Continue Vitamin B12 supplement 8. Acute on chronic CHF: On Coreg , lipitor, imdur and Lasix.  Monitor for signs of overload--monitor weights daily. Decreased IV Lasix to QD.  Order daily weights may be able to change to po lasix  9. CAD/CAF/ s/p AICD: Continues imdur, Coreg and lipitor. Monitor HR tid.  Vitals:   06/19/19 2300 06/20/19 0300  BP:  127/90  Pulse:  82  Resp:  18  Temp:  97.8 F (36.6 C)  SpO2: 100% 100%  Controlled 3/21, 3/22, 3/23 10. Hypokalemia: Improved post supplement -BMET normal on 3/18 and on 3/22    11. ABLA: Will recheck in am.  12. Macular degeneration: Legally blind.   13. Insomnia: Trazodone 25mg  HS PRN  14.  Dysphagia-D2 thin liq - advance per SLP 15. Heat rash: Has benadryl prn; used at 2am last night. Added Sarna lotion and Triamcinolone ointment BID. Pt   16. Aphasia- mainly expressive mild, SLP  LOS: 6 days A FACE TO FACE EVALUATION WAS PERFORMED  Charlett Blake 06/20/2019, 7:34 AM

## 2019-06-20 NOTE — Progress Notes (Signed)
Speech Language Pathology Daily Session Note  Patient Details  Name: Mark Clayton MRN: [id number] Date of Birth: 12/19/1940  Today's Date: 06/20/2019 SLP Individual Time: 0916-1000 SLP Individual Time Calculation (min): 44 min  Short Term Goals: Week 1: SLP Short Term Goal 1 (Week 1): Pt will consume current diet with minimal s/sx aspiration and efficient mastication and oral clearance with use of recommended swallow stategies with Min A verbal/visual cues. SLP Short Term Goal 2 (Week 1): Pt will detect and correct semantic and/or phonemic errors with Min A multimodal cues. SLP Short Term Goal 3 (Week 1): Pt will demonstrate ability to use word-finding strategies in conversation with Min A cues. SLP Short Term Goal 4 (Week 1): Pt will use increased vocal intensity to achieve 90% intellgibility at the converstaion level with Min A cues. SLP Short Term Goal 5 (Week 1): Pt will susatin attention to tasks with Mod A verbal cues for redirection.  Skilled Therapeutic Interventions: Pt was seen for skilled ST targeting dysphagia and cognition. During skilled observation of pt consuming upgraded Dys 3 (mech soft) solid trial, pt demonstrated inefficient prolonged mastication, and politely requested to cease trials due to effort required for mastication. He did use swallow recommendations during intake with only 1 set up verbal cue at beginning. SLP assisted pt in contacting his sister to request she bring partial denture plates, which will likely aid in more efficient mastication. Continue current diet for now and ST will continue to assess potential for advancement as appropriate. SLP engaged pt in generative naming tasks in which he had to generate names of 15 items within a broad category, which he completed with Supervision A verbal cues for use of word finding strategies and to narrow the category. Min A verbal cues were required for word finding correcting phonemic error when trying to say "graham  crackers" within task. Pt's communication continues to be very functional (higher level deficits) and he is aware of verbal errors in conversation. Pt left sitting upright in bed with alarm set and needs within reach. Continue per current plan of care.        Pain Pain Assessment Pain Scale: 0-10 Pain Score: 0-No pain  Therapy/Group: Individual Therapy  Arbutus Leas 06/20/2019, 9:17 AM

## 2019-06-20 NOTE — Progress Notes (Signed)
Occupational Therapy Session Note  Patient Details  Name: Mark Clayton MRN: [id number] Date of Birth: [birth year]  Today's Date: 06/20/2019 OT Individual Time: [id number] OT Individual Time Calculation (min): 45 min    Short Term Goals: Week 1:  OT Short Term Goal 1 (Week 1): Pt will perform LB dressing with mod A overall. OT Short Term Goal 2 (Week 1): Pt will perform UB dressing with mod A overall. OT Short Term Goal 3 (Week 1): Pt will perform toilet transfer with mod A overall. OT Short Term Goal 4 (Week 1): Pt will perform toileting with max A overall.  Skilled Therapeutic Interventions/Progress Updates:  Pt received supine in bed reporting not having a good night and morning. Pt report not getting enough sleep reporting general malaise from lack of sleep. Pt however agreeable to OT intervention. Pt completed bed mobility MOD- MAX A needing assist to advance RLE to EOB and maintain initial sitting balance. Pt completed squat pivot transfer to w/c towards pts L side with MODA. Pt transported to sink for seated grooming tasks. Pt only want to wash face and hair at sink needing only supervision to complete tasks. Pt required MAX cues to incorporate RUE into ADL routine as pt reports "forgetting its there." Pt able to complete oral care with MIN A to apply paste to brush d/t visual deficits. Pt reports the clothes his granddaughter brought him make him itch as he reports being allergic to spandex therefore assisted pt with donning hospital gown with MIN A. Pt required MAX A to don brief and pull up to waist line. Pt completed x2 sit<>stands from w/c with MAX A for balance as pt presents  with R lateral lean in standing. Pt apologetic throughout session stating he could have done more if he had slept better. Assisted pt back to supine with MAX A for squat pivot transfer towards pts R side. Pt left supine in bed with all needs within reach and bed alarm activated.   Therapy  Documentation Precautions:  Precautions Precautions: Fall Precaution Comments: R hemiparesis Restrictions Weight Bearing Restrictions: No General:   Vital Signs:   Pain: Pt reports pain from cramps in LLE during sit<>stand. Pt using mustard as pain mgmt strategy ( approved by RN).  Therapy/Group: Individual Therapy  [name] 06/20/2019, 10:13 AM

## 2019-06-21 ENCOUNTER — Inpatient Hospital Stay (HOSPITAL_COMMUNITY): Payer: Medicare Other | Admitting: Physical Therapy

## 2019-06-21 ENCOUNTER — Inpatient Hospital Stay (HOSPITAL_COMMUNITY): Payer: Medicare Other | Admitting: Speech Pathology

## 2019-06-21 ENCOUNTER — Inpatient Hospital Stay (HOSPITAL_COMMUNITY): Payer: Medicare Other | Admitting: Occupational Therapy

## 2019-06-21 NOTE — Patient Care Conference (Addendum)
Inpatient RehabilitationTeam Conference and Plan of Care Update Date: 06/21/2019   Time: 10:15 AM   Patient Name: Mark Clayton      Medical Record Number: [id number]  Date of Birth: 09-04-1940 Sex: Male         Room/Bed: 4M08C/4M08C-02 Payor Info: Payor: [employer] MEDICARE / Plan: [facility] MEDICARE / Product Type: *No Product type* /    Admit Date/Time:  06/14/2019  5:17 PM  Primary Diagnosis:  Embolic stroke [facility])  Patient Active Problem List   Diagnosis Date Noted  . Embolic stroke ([place]) [id number]  . ICH (intracerebral hemorrhage) ([place]) 06/06/2019  . Dysarthria   . Tinnitus 08/26/2018  . Essential hypertension 01/21/2018  . Coronary artery disease involving native heart with angina pectoris ([place]) 10/19/2017  . Persistent atrial fibrillation   . Routine general medical examination at a health care facility 01/10/2016  . Unstable angina ([place]) 09/20/2014  . Ischemic cardiomyopathy   . Personal history of colon cancer   . Implantable cardioverter-defibrillator (ICD) in situ 03/09/2012  . Microcytic anemia 01/12/2011  . Cardiac defibrillator  MDT VVI 12/26/2010  . Chronic systolic heart failure ([place]) 11/26/2008    Expected Discharge Date: Expected Discharge Date: 07/08/19  Team Members Present: Physician leading conference: Dr. Alysia Penna Care Coodinator Present: Nestor Lewandowsky, RN, BSN, CRRN;Genie Jalayla Chrismer, RN, MSN Nurse Present: Toy Cookey, LPN PT Present: Barrie Folk, PT OT Present: Darleen Crocker, OT SLP Present: Jettie Booze, CF-SLP PPS Coordinator present : Gunnar Fusi, Novella Olive, PT     Current Status/Progress Goal Weekly Team Focus  Bowel/Bladder   continent  of bowel and bladder  maintain normal bowel and bladder      Swallow/Nutrition/ Hydration   Dys 3 (upgraded), thin liquids, full supervision  Supervision least restrictive diet  tolerance Dys 3/thin, increase independence swalow stategies to work toward intermittent supervision    ADL's   Max A bathing/dressing tasks at shower level sit<stand, Max A squat pivot and stand pivot transfers  Min A overall  NMR, functional transfers, postural control, sitting/standing balance   Mobility   modA bed mobility, modA squat pivot and stand pivot transfer, supervision w/c mobility, minA STS with RW  minA overall  standing balance, transfers, pre-gait/gait, R NMR d/c planning   Communication   Higher level word finding, correcting phonemic paraphasias Min A but very aware of errors, Min-Mod increased vocal intensity  Supervision  word finding strategies in conversation, increased independence with intelligibility strategies and correcting phonemic errors   Safety/Cognition/ Behavioral Observations  Supervision-Min A susatined attention, Min A anticipatory awareness  Supervision A  anticipatory awareness, selective attention   Pain   pain lower back  pain score <3      Skin   itching dry  no itching       Rehab Goals Patient on target to meet rehab goals: Yes *See Care Plan and progress notes for long and short-term goals.     Barriers to Discharge  Current Status/Progress Possible Resolutions Date Resolved   Nursing                  PT                    OT                  SLP                SW     Main level living with access to B+B, no  steps to entry and family around to assist          Discharge Planning/Teaching Needs:  Home with daughter and grand-daughter  TBD-Medications, transfers, toileting, etc.   Team Discussion: MD Frustrated easily, macular degeneration, R arm/leg weakness, CKD, monitoring labs.  RN SOB last pm, BM 3/23, cont B/B, using urinal.  OT max B.D shower level, max A transfers, min A goals.  PT mod Bed, min sit to stand, mod transfers, S w/c mobility, min A goals.  SLP upgrade to D3thins, min A higher level word finding and error correction.   Revisions to Treatment Plan: N/A     Medical Summary Current Status: No issues with pain,  blood pressure controlled, chronic visual impairment Weekly Focus/Goal: Improve ability to use phone  Barriers to Discharge: Medical stability       Continued Need for Acute Rehabilitation Level of Care: The patient requires daily medical management by a physician with specialized training in physical medicine and rehabilitation for the following reasons: Direction of a multidisciplinary physical rehabilitation program to maximize functional independence : Yes Medical management of patient stability for increased activity during participation in an intensive rehabilitation regime.: Yes Analysis of laboratory values and/or radiology reports with any subsequent need for medication adjustment and/or medical intervention. : Yes   I attest that I was present, lead the team conference, and concur with the assessment and plan of the team.   Retta Diones 06/21/2019, 3:31 PM   Team conference was held via web/ teleconference due to [place] - 19

## 2019-06-21 NOTE — Progress Notes (Signed)
[place] PHYSICAL MEDICINE & REHABILITATION PROGRESS NOTE   Subjective/Complaints:  No issues overnite   ROS-neg CP, SOB, N/V/D  Objective:   No results found. Recent Labs    06/19/19 0530  WBC 6.5  HGB 13.1  HCT 40.5  PLT 234   Recent Labs    06/19/19 0530  NA 138  K 3.6  CL 99  CO2 27  GLUCOSE 117*  BUN 16  CREATININE 1.18  CALCIUM 9.3    Intake/Output Summary (Last 24 hours) at 06/21/2019 0838 Last data filed at 06/21/2019 0730 Gross per 24 hour  Intake 600 ml  Output 900 ml  Net -300 ml     Physical Exam: Vital Signs Blood pressure 108/70, pulse 82, temperature 98.4 F (36.9 C), resp. rate 17, height 6' (1.829 m), weight 69.2 kg, SpO2 95 %.   General: No acute distress Mood and affect are appropriate Heart: Regular rate and rhythm no rubs murmurs or extra sounds Lungs: Clear to auscultation, breathing unlabored, no rales or wheezes Abdomen: Positive bowel sounds, soft nontender to palpation, nondistended Extremities: No clubbing, cyanosis, or edema Skin: No evidence of breakdown, no evidence of rash Neurologic: Cranial nerves II through XII intact, motor strength is 2-/5 in deltoid, 3/5 bicep, tricep,4/5  Grip,trace  hip flexor, knee extensors, ankle dorsiflexor and plantar flexor Sensory exam normal sensation to light touch and proprioception in bilateral upper and lower extremities   Musculoskeletal:. No joint swelling, arthritis changes in PIP and DIPs    Assessment/Plan: 1. Functional deficits secondary to CVA L MCA and subdural  which require 3+ hours per day of interdisciplinary therapy in a comprehensive inpatient rehab setting.  Physiatrist is providing close team supervision and 24 hour management of active medical problems listed below.  Physiatrist and rehab team continue to assess barriers to discharge/monitor patient progress toward functional and medical goals  Care Tool:  Bathing  Bathing activity did not occur: Refused Body  parts bathed by patient: Face   Body parts bathed by helper: Right arm, Left arm, Buttocks, Right lower leg, Left lower leg Body parts n/a: Front perineal area, Buttocks, Right lower leg, Left lower leg   Bathing assist Assist Level: Supervision/Verbal cueing     Upper Body Dressing/Undressing Upper body dressing   What is the patient wearing?: Hospital gown only    Upper body assist Assist Level: Minimal Assistance - Patient > 75%    Lower Body Dressing/Undressing Lower body dressing      What is the patient wearing?: Pants     Lower body assist Assist for lower body dressing: Maximal Assistance - Patient 25 - 49%     Toileting Toileting    Toileting assist Assist for toileting: Maximal Assistance - Patient 25 - 49%     Transfers Chair/bed transfer  Transfers assist     Chair/bed transfer assist level: Moderate Assistance - Patient 50 - 74%     Locomotion Ambulation   Ambulation assist   Ambulation activity did not occur: Safety/medical concerns          Walk 10 feet activity   Assist  Walk 10 feet activity did not occur: Safety/medical concerns        Walk 50 feet activity   Assist Walk 50 feet with 2 turns activity did not occur: Safety/medical concerns         Walk 150 feet activity   Assist Walk 150 feet activity did not occur: Safety/medical concerns  Walk 10 feet on uneven surface  activity   Assist Walk 10 feet on uneven surfaces activity did not occur: Safety/medical concerns         Wheelchair     Assist Will patient use wheelchair at discharge?: Yes(LT goals set)      Wheelchair assist level: Supervision/Verbal cueing Max wheelchair distance: 50    Wheelchair 50 feet with 2 turns activity    Assist        Assist Level: Supervision/Verbal cueing   Wheelchair 150 feet activity     Assist      Assist Level: Maximal Assistance - Patient 25 - 49%   Blood pressure 108/70, pulse 82,  temperature 98.4 F (36.9 C), resp. rate 17, height 6' (1.829 m), weight 69.2 kg, SpO2 95 %.  Medical Problem List and Plan: 1.  Left parafalcine subdural hematoma and subarachnoid hemorrhage, Left MCA M3 branch embolic stroke  CIR PT, OT -Team conference today please see physician documentation under team conference tab, met with team  to discuss problems,progress, and goals. Formulized individual treatment plan based on medical history, underlying problem and comorbidities.             -patient may shower             -ELOS/Goals: 2-3 weeks 2.  Antithrombotics: -DVT/anticoagulation:  Mechanical: Sequential compression devices, below knee Bilateral lower extremities--Follow up CT head in 2 weeks to decide on resuming AC.              -antiplatelet therapy: N/A 3. Pain Management: N/a 4. Mood: LCSW to follow for evaluation and support.              -antipsychotic agents: N/A 5. Neuropsych: This patient is capable of making decisions on his own behalf. 6. Skin/Wound Care: Routine pressure relief measures.  7. Fluids/Electrolytes/Nutrition: Monitor I/O. Check lytes in am. Continue Vitamin B12 supplement 8. Acute on chronic CHF: On Coreg , lipitor, imdur and Lasix.  Monitor for signs of overload--monitor weights daily. Decreased IV Lasix to QD.  Order daily weights may be able to change to po lasix  9. CAD/CAF/ s/p AICD: Continues imdur, Coreg and lipitor. Monitor HR tid.  Vitals:   06/20/19 1947 06/21/19 0533  BP: 107/65 108/70  Pulse: 78 82  Resp: 16 17  Temp: 97.8 F (36.6 C) 98.4 F (36.9 C)  SpO2: 100% 95%  Controlled 3/24 10. Hypokalemia: Improved post supplement -BMET normal on 3/18 and on 3/22    11. ABLA: Will recheck in am.  12. Macular degeneration: Legally blind.  Needs magnifying glass to read normal print  13. Insomnia: Trazodone '25mg'$  HS PRN  14.  Dysphagia-D2 thin liq - advance per SLP 15. Heat rash: Has benadryl prn; used at 2am last night. Added Sarna lotion and  Triamcinolone ointment BID. Pt  16. Aphasia- mainly expressive mild, SLP  LOS: 7 days A FACE TO FACE EVALUATION WAS PERFORMED  Charlett Blake 06/21/2019, 8:38 AM

## 2019-06-21 NOTE — Progress Notes (Signed)
Speech Language Pathology Daily Session Note  Patient Details  Name: Mark Clayton MRN: [id number] Date of Birth: [birth year]  Today's Date: 06/21/2019 SLP Individual Time: 0816-0900 SLP Individual Time Calculation (min): 44 min  Short Term Goals: Week 1: SLP Short Term Goal 1 (Week 1): Pt will consume current diet with minimal s/sx aspiration and efficient mastication and oral clearance with use of recommended swallow stategies with Min A verbal/visual cues. SLP Short Term Goal 2 (Week 1): Pt will detect and correct semantic and/or phonemic errors with Min A multimodal cues. SLP Short Term Goal 3 (Week 1): Pt will demonstrate ability to use word-finding strategies in conversation with Min A cues. SLP Short Term Goal 4 (Week 1): Pt will use increased vocal intensity to achieve 90% intellgibility at the converstaion level with Min A cues. SLP Short Term Goal 5 (Week 1): Pt will susatin attention to tasks with Mod A verbal cues for redirection.  Skilled Therapeutic Interventions: Pt was seen for skilled ST targeting dysphagia and cognitive goals. Pt's dentures present, although he required Mod A verbal cues and physical assistance to problem solve issues applying adhesive and correct placement in oral cavity. Pt's mastication was fully efficient and oral clearance achieved with upgraded Dys 3 trials; recommend upgrade to Dys 3 solids, continue thin liquids, full supervision to ensure use of swallow strategies. Staff also to assist with ensuring pt is wearing dentures during solid intake. Pt with poor frustration tolerance regarding feedback from MD as well as SLP's attempts to help him problem solve functional tasks in ways to compensate for his visual deficits. Pt left laying in bed with alarm set and needs within reach. Continue per current plan of care.        Pain Pain Assessment Pain Scale: 0-10 Pain Score: 0-No pain  Therapy/Group: Individual Therapy  Arbutus Leas 06/21/2019, 6:57  AM

## 2019-06-21 NOTE — Progress Notes (Signed)
Patient's daughter called with concerns about fathers care. Patient is on a DYS 3 thin liquid 4gm salt, Full supervision. Daughter was concerned about why patient was unable to have drinks left at beside or any meals. Explained to daughter that a full supervision means staff has to be present in the room at all times when patient is eating or drinking. Daughter stated " Every time I come he always has cups left at his bedside, so why is today any different." Explained to daughter again, we are not allowed to leave any drinks or food at the beside with patient without supervision and leaving any food or drinks are prohibited until ordered otherwise for the safety of the patient. Alerted Nursing director of situation. No further concerns to report at this time. Amanda Cockayne, LPN

## 2019-06-21 NOTE — Progress Notes (Signed)
Physical Therapy Session Note  Patient Details  Name: Mark Clayton MRN: [id number] Date of Birth: 10/26/1940  Today's Date: 06/21/2019 PT Individual Time: 1545-1700   75 min   Short Term Goals: Week 1:  PT Short Term Goal 1 (Week 1): Pt will perform bed mobility with mod assist PT Short Term Goal 2 (Week 1): pt will transfe to [facility] with mod assist PT Short Term Goal 3 (Week 1): Pt will ambulate 37f with max assist of 1 PT Short Term Goal 4 (Week 1): Pt will propell WC 1050fwith min assist  Skilled Therapeutic Interventions/Progress Updates:   Pt received supine in bed and agreeable to PT. Supine>sit transfer with mod assist at trunk and RLE. Sit<>stand at EOB x 3 to doff brief, don pants, and urinate into urinal. Pt able to maintain balance with min assist overall with constant LUE support on the bed rail while PT managed clothing and urinal.   [facility] for RLE placement and improved Lweight shift. Pt transported to rehab gym. Gait training instructed by PT with level 3 tband wrap to ankle/knee/hip into flexion and mod-max assist overall for R weight shift, AD management, and RLE management/sequecning.    nustep reciprocal movement training x 4 min BUE/BLE and 4 min BLE only. Mod assist throughout to RUE and RLE for improved position/symmetry and safety  Pt returned to room and performed stand pivot transfer to bed with mod assust. Sit>supine completed with mod assist, and left supine in bed with call bell in reach and all needs met.       Therapy Documentation Precautions:  Precautions Precautions: Fall Precaution Comments: R hemiparesis Restrictions Weight Bearing Restrictions: No    Vital Signs: Therapy Vitals Temp: 98.5 F (36.9 C) Temp Source: Oral Pulse Rate: 84 Resp: 16 BP: 118/71 Patient Position (if appropriate): Lying Oxygen Therapy SpO2: 99 % O2 Device: Room Air Pain: denies   Therapy/Group: Individual Therapy  AuLorie Phenix/24/2021, 3:51 PM

## 2019-06-21 NOTE — Plan of Care (Signed)
  Problem: Consults Goal: RH STROKE PATIENT EDUCATION Description: See Patient Education module for education specifics  Outcome: Progressing   Problem: RH BOWEL ELIMINATION Goal: RH STG MANAGE BOWEL WITH ASSISTANCE Description: STG Manage Bowel with Assistance. Outcome: Progressing Goal: RH STG MANAGE BOWEL W/MEDICATION W/ASSISTANCE Description: STG Manage Bowel with Medication with Assistance. Outcome: Progressing   Problem: RH BLADDER ELIMINATION Goal: RH STG MANAGE BLADDER WITH ASSISTANCE Description: STG Manage Bladder With Assistance Outcome: Progressing Goal: RH STG MANAGE BLADDER WITH MEDICATION WITH ASSISTANCE Description: STG Manage Bladder With Medication With Assistance. Outcome: Progressing   Problem: RH SKIN INTEGRITY Goal: RH STG SKIN FREE OF INFECTION/BREAKDOWN Outcome: Progressing Goal: RH STG MAINTAIN SKIN INTEGRITY WITH ASSISTANCE Description: STG Maintain Skin Integrity With Assistance. Outcome: Progressing   Problem: RH SAFETY Goal: RH STG ADHERE TO SAFETY PRECAUTIONS W/ASSISTANCE/DEVICE Description: STG Adhere to Safety Precautions With Assistance/Device. Outcome: Progressing Goal: RH STG DECREASED RISK OF FALL WITH ASSISTANCE Description: STG Decreased Risk of Fall With Assistance. Outcome: Progressing   Problem: RH PAIN MANAGEMENT Goal: RH STG PAIN MANAGED AT OR BELOW PT'S PAIN GOAL Outcome: Progressing   Problem: RH KNOWLEDGE DEFICIT Goal: RH STG INCREASE KNOWLEDGE OF DIABETES Outcome: Progressing Goal: RH STG INCREASE KNOWLEDGE OF HYPERTENSION Outcome: Progressing Goal: RH STG INCREASE KNOWLEGDE OF HYPERLIPIDEMIA Outcome: Progressing Goal: RH STG INCREASE KNOWLEDGE OF STROKE PROPHYLAXIS Outcome: Progressing

## 2019-06-21 NOTE — Plan of Care (Signed)
  Problem: RH BOWEL ELIMINATION Goal: RH STG MANAGE BOWEL WITH ASSISTANCE Description: STG Manage Bowel with Assistance. Outcome: Progressing Goal: RH STG MANAGE BOWEL W/MEDICATION W/ASSISTANCE Description: STG Manage Bowel with Medication with Assistance. Outcome: Progressing   Problem: RH BLADDER ELIMINATION Goal: RH STG MANAGE BLADDER WITH ASSISTANCE Description: STG Manage Bladder With Assistance Outcome: Progressing Goal: RH STG MANAGE BLADDER WITH MEDICATION WITH ASSISTANCE Description: STG Manage Bladder With Medication With Assistance. Outcome: Progressing   Problem: RH SKIN INTEGRITY Goal: RH STG SKIN FREE OF INFECTION/BREAKDOWN Outcome: Progressing Goal: RH STG MAINTAIN SKIN INTEGRITY WITH ASSISTANCE Description: STG Maintain Skin Integrity With Assistance. Outcome: Progressing   Problem: RH PAIN MANAGEMENT Goal: RH STG PAIN MANAGED AT OR BELOW PT'S PAIN GOAL Outcome: Progressing

## 2019-06-21 NOTE — Progress Notes (Signed)
Occupational Therapy Session Note  Patient Details  Name: Mark Clayton MRN: [id number] Date of Birth: [birth year]  Today's Date: 06/21/2019 OT Individual Time: [id number] Individual Time Calculation (min): 55 min    Short Term Goals: Week 1:  OT Short Term Goal 1 (Week 1): Pt will perform LB dressing with mod A overall. OT Short Term Goal 2 (Week 1): Pt will perform UB dressing with mod A overall. OT Short Term Goal 3 (Week 1): Pt will perform toilet transfer with mod A overall. OT Short Term Goal 4 (Week 1): Pt will perform toileting with max A overall.  Skilled Therapeutic Interventions/Progress Updates:    Upon entering the room, pt supine in bed with c/o "cramping" in R LE. OT assisted pt with stretching R LE in all planes of movement and pt reports, " That feels much better". Pt also upset over needing full supervision for thin liquids and OT provided education again this session for safety concerns with swallowing. Pt verbalizing understanding but remains upset about not being able to have water at all times. Pt performed supine >sit with mod A to EOB. Pt maintaining sitting balance for 15 minutes with close supervision. Pt engaged in R UE AROM , AAROM, and PROM ( shoulder) exercises. Pt able to reach forward and pick up items from tray table but needing hand over hand assistance to bring to mouth/face/shoulder. Pt requesting to return to supine at end of session with mod A for trunk A R LE. Call bell and all needed items within reach. Bed alarm activated.   Therapy Documentation Precautions:  Precautions Precautions: Fall Precaution Comments: R hemiparesis Restrictions Weight Bearing Restrictions: No Pain: Pain Assessment Pain Scale: 0-10 Pain Score: 0-No pain    Other Treatments:     Therapy/Group: Individual Therapy  Gypsy Decant 06/21/2019, 12:38 PM

## 2019-06-22 ENCOUNTER — Inpatient Hospital Stay (HOSPITAL_COMMUNITY): Payer: Medicare Other | Admitting: Physical Therapy

## 2019-06-22 ENCOUNTER — Inpatient Hospital Stay (HOSPITAL_COMMUNITY): Payer: Medicare Other | Admitting: Occupational Therapy

## 2019-06-22 ENCOUNTER — Inpatient Hospital Stay (HOSPITAL_COMMUNITY): Payer: Medicare Other | Admitting: Speech Pathology

## 2019-06-22 NOTE — Progress Notes (Signed)
Team Conference Report to Patient/Family  Team Conference discussion was reviewed with the patient and caregiver, including goals, any changes in plan of care and target discharge date.  Patient and caregiver express understanding and are in agreement.  The patient has a target discharge date of 07/08/19. Daughter to check home DME and will follow up on items recommended that are not already at the home.  Dorien Chihuahua B 06/22/2019, 11:23 AM

## 2019-06-22 NOTE — Progress Notes (Signed)
RN reinforced no drinks at bedside or left in the room without staff present. Patient stated he understood.

## 2019-06-22 NOTE — Progress Notes (Signed)
Physical Therapy Session Note  Patient Details  Name: Mark Clayton MRN: [id number] Date of Birth: 08/06/1940  Today's Date: 06/22/2019 PT Individual Time: 1420-1530 PT Individual Time Calculation (min): 70 min   Short Term Goals: Week 1:  PT Short Term Goal 1 (Week 1): Pt will perform bed mobility with mod assist PT Short Term Goal 2 (Week 1): pt will transfe to [facility] with mod assist PT Short Term Goal 3 (Week 1): Pt will ambulate 19f with max assist of 1 PT Short Term Goal 4 (Week 1): Pt will propell WC 1058fwith min assist  Skilled Therapeutic Interventions/Progress Updates:   Pt received supine in bed and agreeable to PT. Supine>sit transfer with mod assist and mod cues for safety and sequencing to improve activation of R side trunk. Sit<>stand x 3 from EOB with LUE on rail to doff/don brief, and shorts following saturation from poor urinal management. Stand pivot transfer on the L with mod assist to block the R knee.   Pt transported to rehab gym. Gait training with RW, level 3 tband to facilitate hip/knee/ankle flexion with limb advancement and provide improved proprioceptive input, 2 x 2542fith max assist on first bout and mod assist on second. Max cues for sequencing and weight shifting to improve foot clearance on the R with additional tactile facilitation to initiation movement improve terminal knee extension.   Pt performed BWSTT x 1 min .[id number] mod assist for RLE activation and coordination, but reports severe motion sickness, needing to sit due to Nausea and near emesis, [facility]   Pt returned to room and performed stand pivot transfer to bed with mod assist on the R. Sit>supine completed with min assist to the RLE, and left supine in bed with call bell in reach and all needs met.        Therapy Documentation Precautions:  Precautions Precautions: Fall Precaution Comments: R hemiparesis Restrictions Weight Bearing Restrictions: No  Vital  Signs: Therapy Vitals Pulse Rate: 88 Resp: 16 BP: 119/79 Patient Position (if appropriate): Lying Oxygen Therapy SpO2: 97 % O2 Device: Room Air Pain: Pain Assessment Pain Scale: 0-10 Pain Score: 0-No pain  Therapy/Group: Individual Therapy  AustLorie Phenix5/2021, 3:59 PM

## 2019-06-22 NOTE — Progress Notes (Signed)
[place] PHYSICAL MEDICINE & REHABILITATION PROGRESS NOTE   Subjective/Complaints:  Upgraded diet to D3 thins  ROS-neg CP, SOB, N/V/D  Objective:   No results found. No results for input(s): WBC, HGB, HCT, PLT in the last 72 hours. No results for input(s): NA, K, CL, CO2, GLUCOSE, BUN, CREATININE, CALCIUM in the last 72 hours.  Intake/Output Summary (Last 24 hours) at 06/22/2019 0816 Last data filed at 06/22/2019 0700 Gross per 24 hour  Intake 160 ml  Output 1101 ml  Net -941 ml     Physical Exam: Vital Signs Blood pressure 127/88, pulse 87, temperature 97.7 F (36.5 C), temperature source Oral, resp. rate 18, height 6' (1.829 m), weight 66.8 kg, SpO2 96 %.    General: No acute distress Mood and affect are appropriate Heart: Regular rate and rhythm no rubs murmurs or extra sounds Lungs: Clear to auscultation, breathing unlabored, no rales or wheezes Abdomen: Positive bowel sounds, soft nontender to palpation, nondistended Extremities: No clubbing, cyanosis, or edema Skin: No evidence of breakdown, no evidence of rash  Neurologic: Cranial nerves II through XII intact, motor strength is 2-/5 in deltoid, 3/5 bicep, tricep,4/5  Grip,trace  hip flexor, knee extensors, ankle dorsiflexor and plantar flexor Sensory exam normal sensation to light touch and proprioception in bilateral upper and lower extremities   Musculoskeletal:. No joint swelling, arthritis changes in PIP and DIPs    Assessment/Plan: 1. Functional deficits secondary to CVA L MCA and subdural  which require 3+ hours per day of interdisciplinary therapy in a comprehensive inpatient rehab setting.  Physiatrist is providing close team supervision and 24 hour management of active medical problems listed below.  Physiatrist and rehab team continue to assess barriers to discharge/monitor patient progress toward functional and medical goals  Care Tool:  Bathing  Bathing activity did not occur: Refused Body  parts bathed by patient: Face   Body parts bathed by helper: Right arm, Left arm, Buttocks, Right lower leg, Left lower leg Body parts n/a: Front perineal area, Buttocks, Right lower leg, Left lower leg   Bathing assist Assist Level: Supervision/Verbal cueing     Upper Body Dressing/Undressing Upper body dressing   What is the patient wearing?: Hospital gown only    Upper body assist Assist Level: Minimal Assistance - Patient > 75%    Lower Body Dressing/Undressing Lower body dressing      What is the patient wearing?: Pants     Lower body assist Assist for lower body dressing: Maximal Assistance - Patient 25 - 49%     Toileting Toileting    Toileting assist Assist for toileting: Maximal Assistance - Patient 25 - 49%     Transfers Chair/bed transfer  Transfers assist     Chair/bed transfer assist level: Moderate Assistance - Patient 50 - 74%     Locomotion Ambulation   Ambulation assist   Ambulation activity did not occur: Safety/medical concerns          Walk 10 feet activity   Assist  Walk 10 feet activity did not occur: Safety/medical concerns        Walk 50 feet activity   Assist Walk 50 feet with 2 turns activity did not occur: Safety/medical concerns         Walk 150 feet activity   Assist Walk 150 feet activity did not occur: Safety/medical concerns         Walk 10 feet on uneven surface  activity   Assist Walk 10 feet on uneven surfaces activity  did not occur: Safety/medical concerns         Wheelchair     Assist Will patient use wheelchair at discharge?: Yes(LT goals set)      Wheelchair assist level: Supervision/Verbal cueing Max wheelchair distance: 50    Wheelchair 50 feet with 2 turns activity    Assist        Assist Level: Supervision/Verbal cueing   Wheelchair 150 feet activity     Assist      Assist Level: Maximal Assistance - Patient 25 - 49%   Blood pressure 127/88, pulse 87,  temperature 97.7 F (36.5 C), temperature source Oral, resp. rate 18, height 6' (1.829 m), weight 66.8 kg, SpO2 96 %.  Medical Problem List and Plan: 1.  Left parafalcine subdural hematoma and subarachnoid hemorrhage, Left MCA M3 branch embolic stroke  CIR PT, OT -             -patient may shower             -ELOS/Goals: 2-3 weeks 2.  Antithrombotics: -DVT/anticoagulation:  Mechanical: Sequential compression devices, below knee Bilateral lower extremities--Follow up CT head in 2 weeks to decide on resuming AC.              -antiplatelet therapy: N/A 3. Pain Management: N/a 4. Mood: LCSW to follow for evaluation and support.              -antipsychotic agents: N/A 5. Neuropsych: This patient is capable of making decisions on his own behalf. 6. Skin/Wound Care: Routine pressure relief measures.  7. Fluids/Electrolytes/Nutrition: Monitor I/O. Check lytes in am. Continue Vitamin B12 supplement 8. Acute on chronic CHF: On Coreg , lipitor, imdur and Lasix.  Monitor for signs of overload--monitor weights daily. Decreased IV Lasix to QD.  Order daily weights may be able to change to po lasix  9. CAD/CAF/ s/p AICD: Continues imdur, Coreg and lipitor. Monitor HR tid.  Vitals:   06/21/19 1943 06/22/19 0521  BP: 104/68 127/88  Pulse: 74 87  Resp: 17 18  Temp: 97.7 F (36.5 C) 97.7 F (36.5 C)  SpO2: 95% 96%  Controlled 3/25 10. Hypokalemia: Improved post supplement -BMET normal on 3/18 and on 3/22    11. ABLA: Will recheck in am.  12. Macular degeneration: Legally blind.  Needs magnifying glass to read normal print  13. Insomnia: Trazodone 25mg  HS PRN  14.  Dysphagia-D2 thin liq - advance per SLP 15. Heat rash: Has benadryl prn; used at 2am last night. Added Sarna lotion and Triamcinolone ointment BID. Pt  16. Aphasia- mainly expressive mild, SLP  LOS: 8 days A FACE TO FACE EVALUATION WAS PERFORMED  Charlett Blake 06/22/2019, 8:16 AM

## 2019-06-22 NOTE — Progress Notes (Signed)
Occupational Therapy Session Note  Patient Details  Name: Mark Clayton MRN: [id number] Date of Birth: 1940/10/25  Today's Date: 06/22/2019 OT Individual Time: 0705-0800 OT Individual Time Calculation (min): 55 min    Short Term Goals: Week 1:  OT Short Term Goal 1 (Week 1): Pt will perform LB dressing with mod A overall. OT Short Term Goal 2 (Week 1): Pt will perform UB dressing with mod A overall. OT Short Term Goal 3 (Week 1): Pt will perform toilet transfer with mod A overall. OT Short Term Goal 4 (Week 1): Pt will perform toileting with max A overall.  Skilled Therapeutic Interventions/Progress Updates:    Upon entering the room, pt supine in bed and agreeable to OT intervention. Pt requesting to eat breakfast. Supine >sit to EOB with mod A for R LE and trunk. Pt seated with supervision for balance. Pt feeding self with L UE this session and needing assistance to open items and cut food into even smaller bites to make more manageable. Pt needing cuing to manage pocketing this session and cuing for sips between bites as well. Pt then able to hold onto utensil in R hand and demonstrate bring to mouth with AAROM. Pt declined sitting up in wheelchair and returned to supine with mod A. R UE repositioned to protect hemiplegic side and call bell within reach. Bed alarm activated.   Therapy Documentation Precautions:  Precautions Precautions: Fall Precaution Comments: R hemiparesis Restrictions Weight Bearing Restrictions: No Vital Signs: Therapy Vitals Pulse Rate: 77 BP: 138/83 Pain: Pain Assessment Pain Scale: 0-10 Pain Score: 0-No pain   Therapy/Group: Individual Therapy  Gypsy Decant 06/22/2019, 12:40 PM

## 2019-06-22 NOTE — Progress Notes (Signed)
Speech Language Pathology Weekly Progress and Session Note  Patient Details  Name: Mark Clayton MRN: [id number] Date of Birth: 03/04/1941  Beginning of progress report period: June 15, 2019 End of progress report period: June 22, 2019  Today's Date: 06/22/2019 SLP Individual Time: 5462-7035 SLP Individual Time Calculation (min): 58 min  Short Term Goals: Week 1: SLP Short Term Goal 1 (Week 1): Pt will consume current diet with minimal s/sx aspiration and efficient mastication and oral clearance with use of recommended swallow stategies with Min A verbal/visual cues. SLP Short Term Goal 1 - Progress (Week 1): Met SLP Short Term Goal 2 (Week 1): Pt will detect and correct semantic and/or phonemic errors with Min A multimodal cues. SLP Short Term Goal 2 - Progress (Week 1): Met SLP Short Term Goal 3 (Week 1): Pt will detect and correct semantic and/or phonemic errors with Min A multimodal cues. SLP Short Term Goal 3 - Progress (Week 1): Met SLP Short Term Goal 4 (Week 1): Pt will use increased vocal intensity to achieve 90% intellgibility at the converstaion level with Min A cues. SLP Short Term Goal 4 - Progress (Week 1): Met SLP Short Term Goal 5 (Week 1): Pt will susatin attention to tasks with Mod A verbal cues for redirection. SLP Short Term Goal 5 - Progress (Week 1): Met    New Short Term Goals: Week 2: SLP Short Term Goal 1 (Week 2): Pt will consume current diet with minimal s/sx aspiration and efficient mastication and oral clearance with use of recommended swallow stategies with Supervision A verbal/visual cues. SLP Short Term Goal 2 (Week 2): Pt will detect and correct semantic and/or phonemic errors with Supervision A multimodal cues. SLP Short Term Goal 3 (Week 2): Pt will detect and correct semantic and/or phonemic errors with Supervisoin A multimodal cues. SLP Short Term Goal 4 (Week 2): Pt will use increased vocal intensity to achieve 90% intellgibility at the  converstaion level with Supervision A cues. SLP Short Term Goal 5 (Week 2): Pt will susatin attention to tasks with Supervision A verbal cues for redirection. SLP Short Term Goal 6 (Week 2): Pt will anticipate d/c need including ADLs which he will require assistance with and/or equipment he will need with Min A cues.  Weekly Progress Updates: Pt has made excellent functional gains and met 5 out of 5 short term goals. Pt is currently Min assist for word finding and correcting phonemic/semantic errors during conversation due to mild expressive language deficits. Pt was upgraded to Dysphagia 3 (mech soft) diet with thin liquids and uses swallow precautions with no more than Min A cues. Pt has demonstrated improved use of word finding strategies during conversation, sustained attention, and speech intelligibility. Pt and family education is ongoing, although family would benefit from additional education closer to d/c date. Pt would continue to benefit from skilled ST while inpatient in order to maximize functional independence and reduce burden of care prior to discharge. Anticipate that pt will need 24/7 supervision at discharge in addition to [place] follow up at next level of care.       Intensity: Minumum of 1-2 x/day, 30 to 90 minutes Frequency: 3 to 5 out of 7 days Duration/Length of Stay: 4/10 Treatment/Interventions: Cognitive remediation/compensation;Cueing hierarchy;Dysphagia/aspiration precaution training;Functional tasks;Internal/external aids;Speech/Language facilitation;Therapeutic Activities;Patient/family education   Daily Session  Skilled Therapeutic Interventions: Pt was seen for skilled ST targeting cognitive goals. Upon entrance to the room, pt politely requesting assistance with transfer to toilet. With physical assistance from  NT, SLP provided Mod faded to Min A verbal cues for sequencing during transfers from and to restroom via stedy. Pt also required encouragement for initiation and  problem solving throughout, frequently expressing his limitations in ability to assist with standing, wiping after bowel movement, etc. due to his visual impairments. Once back to bed, pt required Mod A questions cues in order to identify and then anticipate 1 activity he may needs assistance with at home. Decreased mental flexibility continues to present challenges functionally and during structured tasks; when asked to anticipate d/c needs, pt frequently insinuated he believed he would be at baseline level physical functioning at time of d/c and stated "it's impossible to anticipate what I will need right now." Of note, pt was Mod I for use of word finding strategies as well as detection and correction of phonemic errors throughout conversation during session. Pt left semi-reclined in bed with alarm set and needs within reach. Continue per current plan of care.         Pain Pain Assessment Pain Scale: 0-10 Pain Score: 0-No pain  Therapy/Group: Individual Therapy  Arbutus Leas 06/22/2019, 6:52 AM

## 2019-06-22 NOTE — Plan of Care (Signed)
  Problem: Consults Goal: RH STROKE PATIENT EDUCATION Description: See Patient Education module for education specifics  Outcome: Progressing   Problem: RH BOWEL ELIMINATION Goal: RH STG MANAGE BOWEL WITH ASSISTANCE Description: STG Manage Bowel with Assistance. Outcome: Progressing Goal: RH STG MANAGE BOWEL W/MEDICATION W/ASSISTANCE Description: STG Manage Bowel with Medication with Assistance. Outcome: Progressing   Problem: RH BLADDER ELIMINATION Goal: RH STG MANAGE BLADDER WITH ASSISTANCE Description: STG Manage Bladder With Assistance Outcome: Progressing Goal: RH STG MANAGE BLADDER WITH MEDICATION WITH ASSISTANCE Description: STG Manage Bladder With Medication With Assistance. Outcome: Progressing   Problem: RH SKIN INTEGRITY Goal: RH STG SKIN FREE OF INFECTION/BREAKDOWN Outcome: Progressing Goal: RH STG MAINTAIN SKIN INTEGRITY WITH ASSISTANCE Description: STG Maintain Skin Integrity With Assistance. Outcome: Progressing   Problem: RH SAFETY Goal: RH STG ADHERE TO SAFETY PRECAUTIONS W/ASSISTANCE/DEVICE Description: STG Adhere to Safety Precautions With Assistance/Device. Outcome: Progressing Goal: RH STG DECREASED RISK OF FALL WITH ASSISTANCE Description: STG Decreased Risk of Fall With Assistance. Outcome: Progressing   Problem: RH PAIN MANAGEMENT Goal: RH STG PAIN MANAGED AT OR BELOW PT'S PAIN GOAL Outcome: Progressing   Problem: RH KNOWLEDGE DEFICIT Goal: RH STG INCREASE KNOWLEDGE OF DIABETES Outcome: Progressing Goal: RH STG INCREASE KNOWLEDGE OF HYPERTENSION Outcome: Progressing Goal: RH STG INCREASE KNOWLEGDE OF HYPERLIPIDEMIA Outcome: Progressing Goal: RH STG INCREASE KNOWLEDGE OF STROKE PROPHYLAXIS Outcome: Progressing

## 2019-06-23 ENCOUNTER — Inpatient Hospital Stay (HOSPITAL_COMMUNITY): Payer: Medicare Other | Admitting: Physical Therapy

## 2019-06-23 ENCOUNTER — Inpatient Hospital Stay (HOSPITAL_COMMUNITY): Payer: Medicare Other | Admitting: Occupational Therapy

## 2019-06-23 ENCOUNTER — Inpatient Hospital Stay (HOSPITAL_COMMUNITY): Payer: Medicare Other | Admitting: Speech Pathology

## 2019-06-23 LAB — URINALYSIS, ROUTINE W REFLEX MICROSCOPIC
Bilirubin Urine: NEGATIVE
Glucose, UA: NEGATIVE mg/dL
Ketones, ur: 20 mg/dL — AB
Nitrite: NEGATIVE
Protein, ur: 100 mg/dL — AB
RBC / HPF: >50 RBC/hpf — ABNORMAL HIGH (ref 0–5)
Specific Gravity, Urine: 1.018 (ref 1.005–1.030)
WBC, UA: >50 WBC/hpf — ABNORMAL HIGH (ref 0–5)
pH: 6 (ref 5.0–8.0)

## 2019-06-23 NOTE — Progress Notes (Signed)
Occupational Therapy Session Note  Patient Details  Name: Mark Clayton MRN: [id number] Date of Birth: Mar 23, 1941  Today's Date: 06/23/2019 OT Individual Time: 1050-1203 OT Individual Time Calculation (min): 73 min   Skilled Therapeutic Interventions/Progress Updates:    Pt greeted in bed, no cramping in Rt LE, feeling badly about "being rude" to staff for keeping water out of reach when he is alone in the room. We discussed his swallowing precautions and how drinks are to be kept out of reach for safety when staff is not present. He was agreeable to shower today. Min A for supine<sit for scooting Rt hip forward due to c/o cramping pain. Pt reported pain subsided once his foot made contact with the floor. Mod A for squat pivot<w/c going towards Rt side. Mod A for squat pivot<padded TTB and pt able to adjust himself for comfort/minimizing Rt LE cramping with CGA throughout shower. Supervision for sitting balance with unilateral support on the grab bar. Had pt grasp the bar with his Rt hand to help with balance support during dynamic activity with pt often releasing grasp due to weakness. He was mindful about actively assisting his Rt hand back onto the bar after it fell into his lap. Active assist to use Rt UE to wash his Lt side. Total A for washing feet. Note that pt requested to terminate shower due to fatigue. Mod A for squat pivot<w/c afterwards. Worked on sit<stand at the sink while incorporating hemi strategies during dressing tasks. Pt able to thread his Lt LE into his pants but needed cuing to recognize he threaded it first into the Rt pant hole. Mod A for sit<stand with Rt knee blocked, Max A for dynamic balance while he attempted to pull pants over hips. Pt ultimately needing Max A for this aspect of dressing. Able to don Lt gripper sock halfway onto foot but could not finish due to fatigue. Mod A for donning overhead shirt. He was able to use his Rt hand as a gross stabilizer during session with  supervision, did need assist to pour soap onto wash cloth and reach Lt axilla during deodorant application. He was agreeable to remain up in the w/c for lunch. Left him with all needs within reach, half lap tray, and safety belt fastened.    Therapy Documentation Precautions:  Precautions Precautions: Fall Precaution Comments: R hemiparesis Restrictions Weight Bearing Restrictions: No Vital Signs: Therapy Vitals Temp: 98.2 F (36.8 C) Temp Source: Oral Pulse Rate: 82 Resp: 19 BP: (!) 96/50 Patient Position (if appropriate): Lying Oxygen Therapy SpO2: 99 % O2 Device: Room Air ADL:       Therapy/Group: Individual Therapy  Mark Clayton 06/23/2019, 4:40 PM

## 2019-06-23 NOTE — Progress Notes (Signed)
Physical Therapy Session Note  Patient Details  Name: Mark Clayton MRN: [id number] Date of Birth: [birth year]  Today's Date: 06/23/2019 PT Individual Time: 1620-1715 PT Individual Time Calculation (min): 55 min   Short Term Goals: Week 1:  PT Short Term Goal 1 (Week 1): Pt will perform bed mobility with mod assist PT Short Term Goal 2 (Week 1): pt will transfe to [facility] with mod assist PT Short Term Goal 3 (Week 1): Pt will ambulate 72f with max assist of 1 PT Short Term Goal 4 (Week 1): Pt will propell WC 1053fwith min assist  Skilled Therapeutic Interventions/Progress Updates:   Pt received supine in bed and agreeable to PT at bed level due to fatigue for recent loose stool and new pain with urination. Supine NMR flexion extension in synergy 3 x 10 BLE. No active hip/knee  flexion noted. SAQ x 10 BLE. Heel slide AAROM x 10. Bridges with BLE and RLE stabilized x 12. Rolling L with mod assist to improved trunkal activation x 10. R shoulder flexion/tricep extension 2 x 15. Pt reports need for urination. Supine>sit with mod assist to maintain adequate trunk rotation to use urinal. Pt able to pass only a few mL, with noted spotting of blood on brief following.   Sit>supine completed with min A and left supine in bed with call bell in reach and all needs met.             Therapy Documentation Precautions:  Precautions Precautions: Fall Precaution Comments: R hemiparesis Restrictions Weight Bearing Restrictions: No  Vital Signs: Therapy Vitals Temp: 98.2 F (36.8 C) Temp Source: Oral Pulse Rate: 82 Resp: 19 BP: (!) 96/50 Patient Position (if appropriate): Lying Oxygen Therapy SpO2: 99 % O2 Device: Room Air Pain: denies  Therapy/Group: Individual Therapy  AuLorie Phenix/26/2021, 5:54 PM

## 2019-06-23 NOTE — Progress Notes (Addendum)
Physical Therapy Session Note  Patient Details  Name: Mark Clayton MRN: [id number] Date of Birth: July 12, 1940  Today's Date: 06/23/2019 PT Individual Time: 1400-1420 PT Individual Time Calculation (min): 20 min  and Today's Date: 06/23/2019 PT Missed Time: 25 Minutes Missed Time Reason: Patient ill (Comment);Patient unwilling to participate(reports stomach pain)  Short Term Goals: Week 1:  PT Short Term Goal 1 (Week 1): Pt will perform bed mobility with mod assist PT Short Term Goal 2 (Week 1): pt will transfe to [facility] with mod assist PT Short Term Goal 3 (Week 1): Pt will ambulate 35ft with max assist of 1 PT Short Term Goal 4 (Week 1): Pt will propell WC 144ft with min assist  Skilled Therapeutic Interventions/Progress Updates:   Pt in supine and reports overall feeling "ill" and stomach pain, but he does not rate, RN made aware. He believes he soiled his brief w/ BM. Therapist checked and pt w/ large loose BM (made RN aware). Total assist for pericare and brief management, supervision to roll to R and mod assist to roll to L w/ verbal/tactile cues for technique. Pt also reports pain and burning w/ urination, also made RN aware of this. Once pt clean and w/ new brief, therapist asked him to get to OOB for therapy session, pt declined 2/2 fatigue and overall ill feeling despite encouragement. Ended session in supine, all needs in reach. Missed 25 min of skilled PT 2/2 refusal/ill feeling.   Therapy Documentation Precautions:  Precautions Precautions: Fall Precaution Comments: R hemiparesis Restrictions Weight Bearing Restrictions: No General: PT Amount of Missed Time (min): 25 Minutes PT Missed Treatment Reason: Patient ill (Comment);Patient unwilling to participate(reports stomach pain)  Therapy/Group: Individual Therapy  Hoa Deriso Clent Demark 06/23/2019, 2:29 PM

## 2019-06-23 NOTE — Progress Notes (Signed)
Speech Language Pathology Daily Session Note  Patient Details  Name: Mark Clayton MRN: [id number] Date of Birth: Dec 18, 1940  Today's Date: 06/23/2019 SLP Individual Time: 0930-1000 SLP Individual Time Calculation (min): 30 min  Short Term Goals: Week 2: SLP Short Term Goal 1 (Week 2): Pt will consume current diet with minimal s/sx aspiration and efficient mastication and oral clearance with use of recommended swallow stategies with Supervision A verbal/visual cues. SLP Short Term Goal 2 (Week 2): Pt will detect and correct semantic and/or phonemic errors with Supervision A multimodal cues. SLP Short Term Goal 3 (Week 2): Pt will detect and correct semantic and/or phonemic errors with Supervisoin A multimodal cues. SLP Short Term Goal 4 (Week 2): Pt will use increased vocal intensity to achieve 90% intellgibility at the converstaion level with Supervision A cues. SLP Short Term Goal 5 (Week 2): Pt will susatin attention to tasks with Supervision A verbal cues for redirection. SLP Short Term Goal 6 (Week 2): Pt will anticipate d/c need including ADLs which he will require assistance with and/or equipment he will need with Min A cues.  Skilled Therapeutic Interventions:  Skilled treatment session targeted informal evaluation of pt's cognitive abilities. Given pt's vision deficits and expressive language deficits informal assessment appeared the most appropriate. Pt presents with good ability to answer complex yes/no questions, is orientation x4 and functional sustained attention to tasks. Pt had moderate difficulty with memory, mental math, mental flexibility questions, convergent and divergent naming tasks as well as category exclusion  Tasks (likely related to memory as pt stated that he "couldn't remember the words." SLP provided pt with physical coins on a white sheet of paper. Pt was not able to visually recognize coins but he felt of them and was able to name them. He was able to count amount  with 3 coins but unable to count more d/t deficits in working memory. Pt also had to use the urinal but lacked initiation even with SLP assistance. Pt left upright in bed, bed alarm on and all needs within reach.      Pain    Therapy/Group: Individual Therapy  Shiya Fogelman 06/23/2019, 4:31 PM

## 2019-06-23 NOTE — Progress Notes (Signed)
Occupational Therapy Weekly Progress Note  Patient Details  Name: Mark Clayton MRN: [id number] Date of Birth: February 18, 1941  Beginning of progress report period: June 16, 2019 End of progress report period: June 23, 2019     Patient has met 1 of 4 short term goals. Pt making steady progress towards OT goals. Pt continues to present with decreased activity tolerance, decreased RUE AROM, decreased balance, and poor insight into deficits limiting ability to engage in ADLs. Overall, pt requires MOD- MAX A for bathing, MIN A for seated grooming tasks, MAX A for UB/LB dressing,MOD-  MAX A for transfers and MIN- MOD A for bed mobility.  No family has been present during session.   Patient continues to demonstrate the following deficits: muscle weakness, decreased cardiorespiratoy endurance, unbalanced muscle activation and decreased coordination, decreased visual acuity, decreased midline orientation and decreased attention to right, decreased attention, decreased awareness, decreased problem solving and decreased safety awareness and decreased sitting balance, decreased standing balance, decreased postural control and hemiplegia and therefore will continue to benefit from skilled OT intervention to enhance overall performance with BADL.  Patient progressing toward long term goals.. OT POC adjusted, please see POC for details.  OT Short Term Goals Week 2:  OT Short Term Goal 1 (Week 2): Pt will perform LB dressing with mod A overall. OT Short Term Goal 2 (Week 2): Pt will perform UB dressing with mod A overall. OT Short Term Goal 3 (Week 2): Pt will perform toilet transfer with mod A overall. OT Short Term Goal 4 (Week 2): Pt will complete bed mobility with MIN A overall.  [name] 06/23/2019, 12:32 PM

## 2019-06-23 NOTE — Progress Notes (Signed)
Physical Therapy Weekly Progress Note  Patient Details  Name: Mark Clayton MRN: [id number] Date of Birth: 12/06/1940  Beginning of progress report period: June 15, 2019 End of progress report period: July 01, 2019  Today's Date: 06/24/2019     Patient has met 4 of 4 short term goals.  Pt is making steady progress towards LTG. Over the past week pt has progressed to mod assist overall for bed mobility and transfers. Min assist WC navigation with hemi technique, and max assist ambulation due to poor sensation and activation of RLE in flexion synergy. Mild decrease in function with possible UTI, causing pt to be very internally distracted over the past few therapy sessions.    Patient continues to demonstrate the following deficits muscle weakness, muscle joint tightness and muscle paralysis, decreased cardiorespiratoy endurance, abnormal tone, unbalanced muscle activation, motor apraxia, decreased coordination and decreased motor planning, decreased visual acuity and decreased visual perceptual skills, decreased attention to right, decreased attention, decreased awareness, decreased problem solving, decreased safety awareness, decreased memory and delayed processing and decreased sitting balance, decreased standing balance, decreased postural control, hemiplegia and decreased balance strategies and therefore will continue to benefit from skilled PT intervention to increase functional independence with mobility.  Patient progressing toward long term goals..  Continue plan of care.  PT Short Term Goals Week 1:  PT Short Term Goal 1 (Week 1): Pt will perform bed mobility with mod assist PT Short Term Goal 1 - Progress (Week 1): Met PT Short Term Goal 2 (Week 1): pt will transfe to [facility] with mod assist PT Short Term Goal 2 - Progress (Week 1): Met PT Short Term Goal 3 (Week 1): Pt will ambulate 37f with max assist of 1 PT Short Term Goal 3 - Progress (Week 1): Met PT Short Term Goal 4 (Week 1): Pt  will propell WC 1041fwith min assist PT Short Term Goal 4 - Progress (Week 1): Met Week 2:  PT Short Term Goal 1 (Week 2): [facility] PT Short Term Goal 2 (Week 2): Pt will ambulate with mod assist x 3048fnd LRAD PT Short Term Goal 3 (Week 2): Pt will propell WC [id number] supervision assist PT Short Term Goal 4 (Week 2): Pt will perform bed mobility with min assist consistently  Skilled Therapeutic Interventions/Progress Updates:   PT attempted to see Pt for treatment, but states that he is too fagitued for constant loose stool and frequent urination for any OOB activity and declines bed level therapy as well. Pt left supine in bed with call bell in reach and all needs met.      Therapy Documentation Precautions:  Precautions Precautions: Fall Precaution Comments: R hemiparesis Restrictions Weight Bearing Restrictions: No  Vital Signs: Therapy Vitals Temp: 98.2 F (36.8 C) Temp Source: Oral Pulse Rate: 82 Resp: 19 BP: (!) 96/50 Patient Position (if appropriate): Lying Oxygen Therapy SpO2: 99 % O2 Device: Room Air Pain: Denies at rest   Therapy/Group: Individual Therapy  AustLorie Phenix7/2021, 5:43 PM

## 2019-06-23 NOTE — Progress Notes (Signed)
Recreational Therapy Assessment and Plan  Patient Details  Name: Mark Clayton MRN: [id number] Date of Birth: Jan 12, 1941 Today's Date: 06/23/2019  Rehab Potential: Good  ELOS: discharge 4/10  Assessment  Problem List:      Patient Active Problem List   Diagnosis Date Noted  . Embolic stroke ([place]) 53/61/4431  . ICH (intracerebral hemorrhage) ([place]) 06/06/2019  . Dysarthria   . Tinnitus 08/26/2018  . Essential hypertension 01/21/2018  . Coronary artery disease involving native heart with angina pectoris ([place]) 10/19/2017  . Persistent atrial fibrillation   . Routine general medical examination at a health care facility 01/10/2016  . Unstable angina ([place]) 09/20/2014  . Ischemic cardiomyopathy   . Personal history of colon cancer   . Implantable cardioverter-defibrillator (ICD) in situ 03/09/2012  . Microcytic anemia 01/12/2011  . Cardiac defibrillator MDT VVI 12/26/2010  . Chronic systolic heart failure ([place]) 11/26/2008   Past Medical History:      Past Medical History:  Diagnosis Date  . AICD (automatic cardioverter/defibrillator) present 01/17/2003   Medtronic Maximo 7232CX ICD, serial [id number]  . Anemia 02-06-11   takes oral iron  . Arthritis    hands, knees  . CAD (coronary artery disease) 2003   a. h/o MI and CABG in 2003. b. s/p DES to SVG-RPDA-RPLB in 08/2014.  Marland Kitchen Cancer of sigmoid colon ([place]) 2012   a. s/p colon surgery.  . Carotid bruit   . Chronic systolic CHF (congestive heart failure) (HCC)    a. EF 20% in 2014; b. 08/2017 Echo: EF 20-25%, diff HK, Gr3 DD. Triv AI. Mod MR. Sev dil LA. Mildly dil RV w/ mildly reduced RV fxn. Mildly dil RA. Mod TR. PASP 24mg.  .Marland KitchenCough   . GERD (gastroesophageal reflux disease) 02-06-11  . HTN (hypertension)   . Hyperlipidemia   . Ischemic cardiomyopathy    a. EF 20% in 2014. (Master study EF >20%); b. 08/2017 Echo: EF 20-25%, diff HK. Gr3 DD.  . LV (left ventricular) mural thrombus    a. 12/2012 Echo: EF 20% with mural  thrombus No evidence of thrombus on 08/2017 echo.  . Macular degeneration   . Myocardial infarct ([place]    2003  . PAF (paroxysmal atrial fibrillation) (HCC)    a. CHA2DS2VASc = 5-->Xarelto/Tikosyn.   Past Surgical History:       Past Surgical History:  Procedure Laterality Date  . CARDIAC CATHETERIZATION N/A 09/20/2014   Procedure: Left Heart Cath and Coronary Angiography; Surgeon: JJettie Booze MD; LAD 95%, D1 100%, CFX liner percent, OM 200%, OM 390%, RCA 90%, LIMA-LAD okay, SVG-OM 2-OM 3 minimal disease, SVG-RPDA-RPLB 100% between the RPDA and RPL   . CARDIAC CATHETERIZATION N/A 09/20/2014   Procedure: Coronary Stent Intervention; Surgeon: JJettie Booze MD; Synergy DES 4 x 24 mm reducing the stenosis to 5%   . CARDIAC DEFIBRILLATOR PLACEMENT  01/17/03   6949 lead. Medtronic. remote-no; with later revision  . CARDIOVERSION N/A 05/22/2016   Procedure: CARDIOVERSION; Surgeon: BLelon Perla MD; [facility]; Service: Cardiovascular; Laterality: N/A;  . CARDIOVERSION N/A 08/10/2017   Procedure: CARDIOVERSION; Surgeon: HPixie Casino MD; [facility]; Service: Cardiovascular; Laterality: N/A;  . CATARACT EXTRACTION W/ INTRAOCULAR LENS IMPLANT, BILATERAL Bilateral June/-July 2009   Dr. GKaty Fitch . COLON RESECTION  02/09/2011   Procedure: LAPAROSCOPIC SIGMOID COLON RESECTION; Surgeon: MPedro Earls MD; Location: [facility]; Service: General; Laterality: N/A; Laparoscopic Assisted Sigmoid Colectomy  . COLON SURGERY    . COLONOSCOPY  08/31/2011  Procedure: COLONOSCOPY; Surgeon: Jerene Bears, MD; Location: [facility]; Service: Gastroenterology; Laterality: N/A;  . COLONOSCOPY N/A 09/05/2012   Procedure: COLONOSCOPY; Surgeon: Jerene Bears, MD; Location: [facility]; Service: Gastroenterology; Laterality: N/A;  . COLONOSCOPY N/A 04/18/2013   Procedure: COLONOSCOPY; Surgeon: Jerene Bears, MD; Location: [facility]; Service: Gastroenterology; Laterality: N/A;  .  COLONOSCOPY N/A 04/09/2014   Procedure: COLONOSCOPY; Surgeon: Jerene Bears, MD; Location: [facility]; Service: Gastroenterology; Laterality: N/A;  . COLONOSCOPY WITH PROPOFOL N/A 05/03/2017   Procedure: COLONOSCOPY WITH PROPOFOL; Surgeon: Jerene Bears, MD; Location: [facility]; Service: Gastroenterology; Laterality: N/A;  . CORONARY ANGIOPLASTY    . CORONARY ARTERY BYPASS GRAFT  01/2002   LIMA-LAD, SVG-OM 2-OM 3, SVG-RPDA-RPLB  . CORONARY STENT INTERVENTION N/A 11/22/2018   Procedure: CORONARY STENT INTERVENTION; Surgeon: Nelva Bush, MD; Location: [place] [facility]; Service: Cardiovascular; Laterality: N/A; SVG - RCA  . ICD GENERATOR CHANGE  2010   Medtronic Virtuoso II VR ICD  . ICD GENERATOR CHANGEOUT N/A 12/07/2018   Procedure: ICD GENERATOR CHANGEOUT; Surgeon: Deboraha Sprang, MD; Location: [place]; Service: Cardiovascular; Laterality: N/A;  . INGUINAL HERNIA REPAIR Right 2000's X 2  . LAPAROSCOPIC RIGHT HEMI COLECTOMY N/A 11/04/2012   Procedure: LAPAROSCOPIC RIGHT HEMI COLECTOMY; Surgeon: Pedro Earls, MD; Location: [facility]; Service: General; Laterality: N/A;  . LEFT HEART CATH AND CORS/GRAFTS ANGIOGRAPHY Left 11/22/2018   Procedure: LEFT HEART CATH AND CORS/GRAFTS ANGIOGRAPHY; Surgeon: Nelva Bush, MD; Location: [place] [facility]; Service: Cardiovascular; Laterality: Left;  . TONSILLECTOMY  ~ 1950   Assessment & Plan  Clinical Impression: Patient is a 79 y.o. year old male with history of colon cancer, Chronic systolic CHF with AICD, PAF, anemia, macular degeneration--legally blind, LV thrombus in the past who was admitted on 06/06/19 for work-up after falling backwards in the yard, striking his head and developing left-sided weakness. CT head showed left hemispheric SDH therefore Xarelto was reversed with Kcentra. CTA/perfusion of head/neck showed branch occlusion left M3 with trickle flow corresponding acute infarct left parietal lobe with surrounding penumbra.  Neurology felt left-MCA infarct was embolic due to A. fib and no antithrombotic due to hemorrhage. Dr. Trenton Gammon recommended conservative management with serial CT head for monitoring. BP was elevated at admission and has been treated with Cleviprex. 2D echo showed global hypokinesis with EF of 20%, aortic sclerosis without stenosis and moderately elevated pulmonary pressures.  Follow-up CT head showed stable bleed with development of left frontoparietal infarct. Dr. Erlinda Hong felt that patien tiwth embolic stroke due to AF and severe cardiomyopathy--SDH due to fall with stroke. Currently off antithrombotics and no AC due to SDH---To consider resuming AC in 2-3 weeks if SDH resolving and clinically stable. Patient reported chocking on foods and MBS done revealing oropharyngeal dysphagia and residue--to continue dysphagia 2, thins with multiple hard swallows and throat clears. He developed pre-renal azotemia and was treated with IVF X 24 hours. He developed cough with upward trend in weight by 6 lbs and continues on IV lasix for diuresis for acute on chronic CHF--to transition back to oral when back to baseline weight of 74 kg. Therapy has been ongoing and patient limited by dysarthria with expressive>receptive aphasia, paraphasias, right hemiparesis. CIR recommended due to functional decline. . Patient transferred to CIR on 06/14/2019.  Pt presents with decreased activity tolerance, decreased balance, decreased coordination, decreased awareness, decreased problem solving and decreased safety awareness, feelings of stress and anxiety Limiting pt's independence with leisure/community pursuits.    Plan  Min 1 TR session >25  Minutes per week during LOS  Recommendations for other services: None   Discharge Criteria: Patient will be discharged from TR if patient refuses treatment 3 consecutive times without medical reason.  If treatment goals not met, if there is a change in medical status, if patient makes no progress  towards goals or if patient is discharged from hospital.  The above assessment, treatment plan, treatment alternatives and goals were discussed and mutually agreed upon: by patient  Barney 06/23/2019, 10:42 AM

## 2019-06-23 NOTE — Progress Notes (Signed)
[place] PHYSICAL MEDICINE & REHABILITATION PROGRESS NOTE   Subjective/Complaints:  Pt c/o that he does not get to sip water at bedside, full supervision meals and drinks, pt frustrated   ROS-neg CP, SOB, N/V/D  Objective:   No results found. No results for input(s): WBC, HGB, HCT, PLT in the last 72 hours. No results for input(s): NA, K, CL, CO2, GLUCOSE, BUN, CREATININE, CALCIUM in the last 72 hours.  Intake/Output Summary (Last 24 hours) at 06/23/2019 0821 Last data filed at 06/22/2019 1833 Gross per 24 hour  Intake 240 ml  Output 550 ml  Net -310 ml     Physical Exam: Vital Signs Blood pressure 138/83, pulse 91, temperature 98.2 F (36.8 C), resp. rate 18, height 6' (1.829 m), weight 67.8 kg, SpO2 98 %.    General: No acute distress Mood and affect are appropriate Heart: Regular rate and rhythm no rubs murmurs or extra sounds Lungs: Clear to auscultation, breathing unlabored, no rales or wheezes Abdomen: Positive bowel sounds, soft nontender to palpation, nondistended Extremities: No clubbing, cyanosis, or edema Skin: No evidence of breakdown, no evidence of rash  Neurologic: Cranial nerves II through XII intact, motor strength is 2-/5 in deltoid, 3/5 bicep, tricep,4/5  Grip,trace  hip flexor, knee extensors, ankle dorsiflexor and plantar flexor Sensory exam normal sensation to light touch and proprioception in bilateral upper and lower extremities   Musculoskeletal:. No joint swelling, arthritis changes in PIP and DIPs    Assessment/Plan: 1. Functional deficits secondary to CVA L MCA and subdural  which require 3+ hours per day of interdisciplinary therapy in a comprehensive inpatient rehab setting.  Physiatrist is providing close team supervision and 24 hour management of active medical problems listed below.  Physiatrist and rehab team continue to assess barriers to discharge/monitor patient progress toward functional and medical goals  Care  Tool:  Bathing  Bathing activity did not occur: Refused Body parts bathed by patient: Face   Body parts bathed by helper: Right arm, Left arm, Buttocks, Right lower leg, Left lower leg Body parts n/a: Front perineal area, Buttocks, Right lower leg, Left lower leg   Bathing assist Assist Level: Supervision/Verbal cueing     Upper Body Dressing/Undressing Upper body dressing   What is the patient wearing?: Hospital gown only    Upper body assist Assist Level: Minimal Assistance - Patient > 75%    Lower Body Dressing/Undressing Lower body dressing      What is the patient wearing?: Pants     Lower body assist Assist for lower body dressing: Maximal Assistance - Patient 25 - 49%     Toileting Toileting    Toileting assist Assist for toileting: Maximal Assistance - Patient 25 - 49%     Transfers Chair/bed transfer  Transfers assist     Chair/bed transfer assist level: Moderate Assistance - Patient 50 - 74%     Locomotion Ambulation   Ambulation assist   Ambulation activity did not occur: Safety/medical concerns  Assist level: Moderate Assistance - Patient 50 - 74% Assistive device: Walker-rolling Max distance: 25   Walk 10 feet activity   Assist  Walk 10 feet activity did not occur: Safety/medical concerns  Assist level: Moderate Assistance - Patient - 50 - 74% Assistive device: Walker-rolling, Orthosis   Walk 50 feet activity   Assist Walk 50 feet with 2 turns activity did not occur: Safety/medical concerns         Walk 150 feet activity   Assist Walk 150 feet activity  did not occur: Safety/medical concerns         Walk 10 feet on uneven surface  activity   Assist Walk 10 feet on uneven surfaces activity did not occur: Safety/medical concerns         Wheelchair     Assist Will patient use wheelchair at discharge?: Yes(LT goals set)      Wheelchair assist level: Supervision/Verbal cueing Max wheelchair distance: 50     Wheelchair 50 feet with 2 turns activity    Assist        Assist Level: Supervision/Verbal cueing   Wheelchair 150 feet activity     Assist      Assist Level: Maximal Assistance - Patient 25 - 49%   Blood pressure 138/83, pulse 91, temperature 98.2 F (36.8 C), resp. rate 18, height 6' (1.829 m), weight 67.8 kg, SpO2 98 %.  Medical Problem List and Plan: 1.  Left parafalcine subdural hematoma and subarachnoid hemorrhage, Left MCA M3 branch embolic stroke  CIR PT, OT -             -patient may shower             -ELOS/Goals: 2-3 weeks 2.  Antithrombotics: -DVT/anticoagulation:  Mechanical: Sequential compression devices, below knee Bilateral lower extremities--Follow up CT head in 2 weeks to decide on resuming AC.              -antiplatelet therapy: N/A 3. Pain Management: N/a 4. Mood: LCSW to follow for evaluation and support.              -antipsychotic agents: N/A 5. Neuropsych: This patient is capable of making decisions on his own behalf. 6. Skin/Wound Care: Routine pressure relief measures.  7. Fluids/Electrolytes/Nutrition: Monitor I/O. Check lytes in am. Continue Vitamin B12 supplement 8. Acute on chronic CHF: On Coreg , lipitor, imdur and Lasix.  Monitor for signs of overload--monitor weights daily. Decreased IV Lasix to QD.  Order daily weights may be able to change to po lasix  9. CAD/CAF/ s/p AICD: Continues imdur, Coreg and lipitor. Monitor HR tid.  Vitals:   06/23/19 0323 06/23/19 0459  BP: 121/89 138/83  Pulse: 77 91  Resp: 16 18  Temp: 98.2 F (36.8 C)   SpO2: 99% 98%  Controlled 3/26 10. Hypokalemia: Improved post supplement -BMET normal on 3/18 and on 3/22    11. ABLA: Will recheck in am.  12. Macular degeneration: Legally blind.  Needs magnifying glass to read normal print  13. Insomnia: Trazodone 25mg  HS PRN  14.  Dysphagia-D3 thin liq - advance per SLP- still requires supervision  15. Heat rash: Has benadryl prn; used at 2am last night.  Added Sarna lotion and Triamcinolone ointment BID. Pt  16. Aphasia- mainly expressive mild, SLP  LOS: 9 days A FACE TO FACE EVALUATION WAS PERFORMED  Charlett Blake 06/23/2019, 8:21 AM

## 2019-06-24 ENCOUNTER — Inpatient Hospital Stay (HOSPITAL_COMMUNITY): Payer: Medicare Other

## 2019-06-24 DIAGNOSIS — N390 Urinary tract infection, site not specified: Secondary | ICD-10-CM

## 2019-06-24 DIAGNOSIS — E876 Hypokalemia: Secondary | ICD-10-CM

## 2019-06-24 DIAGNOSIS — I5043 Acute on chronic combined systolic (congestive) and diastolic (congestive) heart failure: Secondary | ICD-10-CM

## 2019-06-24 DIAGNOSIS — I69391 Dysphagia following cerebral infarction: Secondary | ICD-10-CM

## 2019-06-24 MED ORDER — NITROFURANTOIN MONOHYD MACRO 100 MG PO CAPS
100.0000 mg | ORAL_CAPSULE | Freq: Two times a day (BID) | ORAL | Status: DC
  Administered 2019-06-24 – 2019-06-25 (×3): 100 mg via ORAL
  Filled 2019-06-24 (×3): qty 1

## 2019-06-24 NOTE — Plan of Care (Signed)
  Problem: Consults Goal: RH STROKE PATIENT EDUCATION Description: See Patient Education module for education specifics  Outcome: Progressing   Problem: RH BOWEL ELIMINATION Goal: RH STG MANAGE BOWEL WITH ASSISTANCE Description: STG Manage Bowel with Assistance. Outcome: Progressing Goal: RH STG MANAGE BOWEL W/MEDICATION W/ASSISTANCE Description: STG Manage Bowel with Medication with Assistance. Outcome: Progressing   Problem: RH BLADDER ELIMINATION Goal: RH STG MANAGE BLADDER WITH ASSISTANCE Description: STG Manage Bladder With Assistance Outcome: Progressing Goal: RH STG MANAGE BLADDER WITH MEDICATION WITH ASSISTANCE Description: STG Manage Bladder With Medication With Assistance. Outcome: Progressing   Problem: RH SKIN INTEGRITY Goal: RH STG SKIN FREE OF INFECTION/BREAKDOWN Outcome: Progressing Goal: RH STG MAINTAIN SKIN INTEGRITY WITH ASSISTANCE Description: STG Maintain Skin Integrity With Assistance. Outcome: Progressing   Problem: RH SAFETY Goal: RH STG ADHERE TO SAFETY PRECAUTIONS W/ASSISTANCE/DEVICE Description: STG Adhere to Safety Precautions With Assistance/Device. Outcome: Progressing Goal: RH STG DECREASED RISK OF FALL WITH ASSISTANCE Description: STG Decreased Risk of Fall With Assistance. Outcome: Progressing   Problem: RH PAIN MANAGEMENT Goal: RH STG PAIN MANAGED AT OR BELOW PT'S PAIN GOAL Outcome: Progressing   Problem: RH KNOWLEDGE DEFICIT Goal: RH STG INCREASE KNOWLEDGE OF DIABETES Outcome: Progressing Goal: RH STG INCREASE KNOWLEDGE OF HYPERTENSION Outcome: Progressing Goal: RH STG INCREASE KNOWLEGDE OF HYPERLIPIDEMIA Outcome: Progressing Goal: RH STG INCREASE KNOWLEDGE OF STROKE PROPHYLAXIS Outcome: Progressing

## 2019-06-24 NOTE — Progress Notes (Signed)
Occupational Therapy Session Note  Patient Details  Name: Mark Clayton MRN: [id number] Date of Birth: 1940/06/06  Today's Date: 06/24/2019 OT Individual Time: 1050-1200 OT Individual Time Calculation (min): 70 min    Short Term Goals: Week 2:  OT Short Term Goal 1 (Week 2): Pt will perform LB dressing with mod A overall. OT Short Term Goal 2 (Week 2): Pt will perform UB dressing with mod A overall. OT Short Term Goal 3 (Week 2): Pt will perform toilet transfer with mod A overall. OT Short Term Goal 4 (Week 2): Pt will complete bed mobility with MIN A overall.  Skilled Therapeutic Interventions/Progress Updates:  Pt received supine in bed stating he needed to be cleaned up after BM. Pt required MODA for bed mobility to roll R<>L for brief change; total A for pericare. Pt completed supine>sit with MOD A to elevate trunk and cues for compensatory strategy to advance BLEs to EOB. Pt required MAX A for LB dressing to don shorts and shoes EOB. x2 sit<>stands from EOB with MODA to pull pants up waist line. Squat pivot transfer to w/c towards pts L side with MOD- MAX A. Pt transported to therapy gym with total A. Pt transferred to mat in similar fashion as previously indicated. Pt completed RUE NMR on mat via [place] to R side to retrieve cups and cross midline to place on side table. Pt completed x10 reps needing MIN- MODA to return back to midline. Pt required tactile cues to locate cups d/t visual deficits. Pt able to retrieve clothespins from side table placed in front of pt but reports depth preception deficits limiting ability to place clothes pins on rack without assist. Pt completed similar squat pivot transfer to pts L side with MODA where pt was transported back to room in w/c. Pt returned to supine with MODA to elevate legs back to supine. Pt left supine with bed alarm activated and all needs within reach.   Therapy Documentation Precautions:  Precautions Precautions: Fall Precaution  Comments: R hemiparesis Restrictions Weight Bearing Restrictions: No General:   Vital Signs:   Pain: Pt reports discomfort from sitting in BM; assisted pt with pericare and brief change.   Therapy/Group: Individual Therapy  [name] 06/24/2019, 12:04 PM

## 2019-06-24 NOTE — Progress Notes (Signed)
[name] PHYSICAL MEDICINE & REHABILITATION PROGRESS NOTE   Subjective/Complaints: Patient seen sitting up in bed this morning.  He states he did not sleep well overnight due to hematuria.  He requests antibiotics.  He also notes that his back itches.  ROS: + Pruritus.  Denies CP, SOB, N/V/D  Objective:   No results found. No results for input(s): WBC, HGB, HCT, PLT in the last 72 hours. No results for input(s): NA, K, CL, CO2, GLUCOSE, BUN, CREATININE, CALCIUM in the last 72 hours.  Intake/Output Summary (Last 24 hours) at 06/24/2019 1109 Last data filed at 06/24/2019 0500 Gross per 24 hour  Intake 580 ml  Output 250 ml  Net 330 ml     Physical Exam: Vital Signs Blood pressure 114/74, pulse 93, temperature 98.7 F (37.1 C), temperature source Oral, resp. rate 16, height 6' (1.829 m), weight 67.9 kg, SpO2 96 %. Constitutional: No distress . Vital signs reviewed. HENT: Normocephalic.  Atraumatic. Eyes: EOMI. No discharge. Cardiovascular: No JVD. Respiratory: Normal effort.  No stridor. GI: Non-distended. Skin: Warm and dry.  Intact. Psych: Normal mood.  Normal behavior. Musc: No edema in extremities.  No tenderness in extremities. Neurologic: Alert Motor: RUE: Shoulder adduction 3 -/5, elbow flexion/extension 3+/5, handgrip 2/5   Assessment/Plan: 1. Functional deficits secondary to CVA L MCA and subdural  which require 3+ hours per day of interdisciplinary therapy in a comprehensive inpatient rehab setting.  Physiatrist is providing close team supervision and 24 hour management of active medical problems listed below.  Physiatrist and rehab team continue to assess barriers to discharge/monitor patient progress toward functional and medical goals  Care Tool:  Bathing  Bathing activity did not occur: Refused Body parts bathed by patient: Face, Right arm, Chest, Abdomen, Front perineal area, Right upper leg, Left upper leg   Body parts bathed by helper: Left arm,  Buttocks, Right lower leg, Left lower leg Body parts n/a: Front perineal area, Buttocks, Right lower leg, Left lower leg   Bathing assist Assist Level: Moderate Assistance - Patient 50 - 74%     Upper Body Dressing/Undressing Upper body dressing   What is the patient wearing?: Pull over shirt    Upper body assist Assist Level: Moderate Assistance - Patient 50 - 74%    Lower Body Dressing/Undressing Lower body dressing      What is the patient wearing?: Pants, Incontinence brief     Lower body assist Assist for lower body dressing: Maximal Assistance - Patient 25 - 49%     Toileting Toileting    Toileting assist Assist for toileting: Maximal Assistance - Patient 25 - 49%     Transfers Chair/bed transfer  Transfers assist     Chair/bed transfer assist level: Moderate Assistance - Patient 50 - 74%     Locomotion Ambulation   Ambulation assist   Ambulation activity did not occur: Safety/medical concerns  Assist level: Moderate Assistance - Patient 50 - 74% Assistive device: Walker-rolling Max distance: 25   Walk 10 feet activity   Assist  Walk 10 feet activity did not occur: Safety/medical concerns  Assist level: Moderate Assistance - Patient - 50 - 74% Assistive device: Walker-rolling, Orthosis   Walk 50 feet activity   Assist Walk 50 feet with 2 turns activity did not occur: Safety/medical concerns         Walk 150 feet activity   Assist Walk 150 feet activity did not occur: Safety/medical concerns         Walk 10 feet  on uneven surface  activity   Assist Walk 10 feet on uneven surfaces activity did not occur: Safety/medical concerns         Wheelchair     Assist Will patient use wheelchair at discharge?: Yes(LT goals set)      Wheelchair assist level: Supervision/Verbal cueing Max wheelchair distance: 50    Wheelchair 50 feet with 2 turns activity    Assist        Assist Level: Supervision/Verbal cueing    Wheelchair 150 feet activity     Assist      Assist Level: Maximal Assistance - Patient 25 - 49%   Blood pressure 114/74, pulse 93, temperature 98.7 F (37.1 C), temperature source Oral, resp. rate 16, height 6' (1.829 m), weight 67.9 kg, SpO2 96 %.  Medical Problem List and Plan: 1.  Left parafalcine subdural hematoma and subarachnoid hemorrhage, Left MCA M3 branch embolic stroke   Continue CIR 2.  Antithrombotics: -DVT/anticoagulation:  Mechanical: Sequential compression devices, below knee Bilateral lower extremities--Follow up CT head in 2 weeks to decide on resuming AC.              -antiplatelet therapy: N/A 3. Pain Management: N/a 4. Mood: LCSW to follow for evaluation and support.              -antipsychotic agents: N/A 5. Neuropsych: This patient is capable of making decisions on his own behalf. 6. Skin/Wound Care: Routine pressure relief measures.  7. Fluids/Electrolytes/Nutrition: Monitor I/Os. Continue Vitamin B12 supplement 8. Acute on chronic CHF: On Coreg , lipitor, imdur and Lasix.  Monitor for signs of overload--monitor weights daily.  Filed Weights   06/22/19 0521 06/23/19 0500 06/24/19 0506  Weight: 66.8 kg 67.8 kg 67.9 kg   Stable on 3/27 9. CAD/CAF/ s/p AICD: Continues imdur, Coreg and lipitor. Monitor HR tid.  Vitals:   06/23/19 1942 06/24/19 0506  BP: (!) 123/59 114/74  Pulse: 85 93  Resp: 18 16  Temp: 98.5 F (36.9 C) 98.7 F (37.1 C)  SpO2: 98% 96%   Controlled on 3/27 10. Hypokalemia:   Potassium 3.6 on 3/22, labs ordered for Monday 11. ABLA: Resolved 12. Macular degeneration: Legally blind.  Needs magnifying glass to read normal print  13. Sleep disturbance: Trazodone 25mg  HS PRN  14.  Post stroke dysphagia-D3 thins liq - advance per SLP- still requires supervision  15. Heat rash: Has benadryl prn; used at 2am last night.  Continue Sarna lotion and Triamcinolone ointment BID.  16. Aphasia- mainly expressive mild, SLP  17.  Acute lower  UTI  UA +, urine culture pending  Empiric Macrobid started on 3/27  LOS: 10 days A FACE TO FACE EVALUATION WAS PERFORMED  Mark Clayton Mark Clayton 06/24/2019, 11:09 AM

## 2019-06-25 ENCOUNTER — Inpatient Hospital Stay (HOSPITAL_COMMUNITY): Payer: Medicare Other | Admitting: Physical Therapy

## 2019-06-25 MED ORDER — CEPHALEXIN 250 MG PO CAPS
250.0000 mg | ORAL_CAPSULE | Freq: Four times a day (QID) | ORAL | Status: AC
  Administered 2019-06-25 – 2019-06-29 (×14): 250 mg via ORAL
  Filled 2019-06-25 (×14): qty 1

## 2019-06-25 NOTE — Progress Notes (Signed)
Verbalized discomfort aching to bilateral legs Tylenol 650 mg po administered and repositioned, denies additional discomfort

## 2019-06-25 NOTE — Progress Notes (Signed)
Physical Therapy Session Note  Patient Details  Name: Mark Clayton MRN: [id number] Date of Birth: [birth year]  Today's Date: 06/25/2019 PT Individual Time: 6712-4580 PT Individual Time Calculation (min): 54 min   Short Term Goals: Week 2:  PT Short Term Goal 1 (Week 2): PT will transfer to [facility] with min assist consistently PT Short Term Goal 2 (Week 2): Pt will ambulate with mod assist x 54f and LRAD PT Short Term Goal 3 (Week 2): Pt will propell WC 1086fwith supervision assist PT Short Term Goal 4 (Week 2): Pt will perform bed mobility with min assist consistently  Skilled Therapeutic Interventions/Progress Updates: Pt presented in bed with dgt present agreeable to therapy. Pt stating feeling better than previous days. Pt agreeable to OOB activity. Performed supine to sit at EOB with modA primarily for RLE management and mod multimodal cues for sequencing. At EOB PTA threaded pants total A for time management. Pt performed STS with RW at EOB modA to allow PTA to pull pants over hips. Performed stand pivot to w/c on R modA. Pt transported to rehab gym. Discussed with pt energy conservation and provided emotional support as pt feels like took several steps back due to UTI and frequent stools over past few days. Pt then noted foul odor and indicated possible BM. Pt performed STS from w/c with RW modA with PTA confirming BM. Pt transported back to room and PTA was able to locate Stedy to perform peri-care. Performed STS in [place] PTA cuing pt to maintain grasp with R hand on crossbar. Pt was able to tolerate standing in Stedy x 3 min as NT performed peri-care. Pt requesting to return to bed after peri-care due to fatigue. [place] performed sit to supine with modA for RLE management. Pt positioned to comfort and left with bed alarm on, call bell within reach and needs met.      Therapy Documentation Precautions:  Precautions Precautions: Fall Precaution Comments: R  hemiparesis Restrictions Weight Bearing Restrictions: No General:   Vital Signs: Therapy Vitals Temp: 97.8 F (36.6 C) Temp Source: Oral Pulse Rate: 85 Resp: 18 BP: (!) 146/72 Patient Position (if appropriate): Sitting Oxygen Therapy SpO2: 98 % O2 Device: Room Air    Therapy/Group: Individual Therapy  Bama Hanselman 06/25/2019, 4:18 PM

## 2019-06-25 NOTE — Progress Notes (Signed)
[facility] PHYSICAL MEDICINE & REHABILITATION PROGRESS NOTE   Subjective/Complaints: Patient seen sitting up in bed this morning.  He states he slept well overnight.  He denies complaints.  ROS: Denies CP, SOB, N/V/D  Objective:   No results found. No results for input(s): WBC, HGB, HCT, PLT in the last 72 hours. No results for input(s): NA, K, CL, CO2, GLUCOSE, BUN, CREATININE, CALCIUM in the last 72 hours.  Intake/Output Summary (Last 24 hours) at 06/25/2019 0912 Last data filed at 06/25/2019 0700 Gross per 24 hour  Intake 460 ml  Output 200 ml  Net 260 ml     Physical Exam: Vital Signs Blood pressure 124/74, pulse 83, temperature 99 F (37.2 C), temperature source Oral, resp. rate 16, height 6' (1.829 m), weight 69 kg, SpO2 97 %. Constitutional: No distress . Vital signs reviewed. HENT: Normocephalic.  Atraumatic. Eyes: EOMI. No discharge. Cardiovascular: No JVD. Respiratory: Normal effort.  No stridor. GI: Non-distended. Skin: Warm and dry.  Intact. Psych: Slowed Musc: No edema in extremities.  No tenderness in extremities. Neurologic: Alert HOH Motor: RUE: Shoulder adduction 3 -/5, elbow flexion/extension 3+/5, handgrip 2/5  LUE: 4+/5 proximal distal LLE: 4+/5 proximal distal RLE: 2 -/5 proximal to distal  Assessment/Plan: 1. Functional deficits secondary to CVA L MCA and subdural  which require 3+ hours per day of interdisciplinary therapy in a comprehensive inpatient rehab setting.  Physiatrist is providing close team supervision and 24 hour management of active medical problems listed below.  Physiatrist and rehab team continue to assess barriers to discharge/monitor patient progress toward functional and medical goals  Care Tool:  Bathing  Bathing activity did not occur: Refused Body parts bathed by patient: Face, Right arm, Chest, Abdomen, Front perineal area, Right upper leg, Left upper leg   Body parts bathed by helper: Left arm, Buttocks, Right lower  leg, Left lower leg Body parts n/a: Front perineal area, Buttocks, Right lower leg, Left lower leg   Bathing assist Assist Level: Moderate Assistance - Patient 50 - 74%     Upper Body Dressing/Undressing Upper body dressing   What is the patient wearing?: Pull over shirt    Upper body assist Assist Level: Moderate Assistance - Patient 50 - 74%    Lower Body Dressing/Undressing Lower body dressing      What is the patient wearing?: Pants(shorts)     Lower body assist Assist for lower body dressing: Maximal Assistance - Patient 25 - 49%     Toileting Toileting    Toileting assist Assist for toileting: Dependent - Patient 0%     Transfers Chair/bed transfer  Transfers assist     Chair/bed transfer assist level: Moderate Assistance - Patient 50 - 74%     Locomotion Ambulation   Ambulation assist   Ambulation activity did not occur: Safety/medical concerns  Assist level: Moderate Assistance - Patient 50 - 74% Assistive device: Walker-rolling Max distance: 25   Walk 10 feet activity   Assist  Walk 10 feet activity did not occur: Safety/medical concerns  Assist level: Moderate Assistance - Patient - 50 - 74% Assistive device: Walker-rolling, Orthosis   Walk 50 feet activity   Assist Walk 50 feet with 2 turns activity did not occur: Safety/medical concerns         Walk 150 feet activity   Assist Walk 150 feet activity did not occur: Safety/medical concerns         Walk 10 feet on uneven surface  activity   Assist Walk 10  feet on uneven surfaces activity did not occur: Safety/medical concerns         Wheelchair     Assist Will patient use wheelchair at discharge?: Yes(LT goals set)      Wheelchair assist level: Supervision/Verbal cueing Max wheelchair distance: 50    Wheelchair 50 feet with 2 turns activity    Assist        Assist Level: Supervision/Verbal cueing   Wheelchair 150 feet activity     Assist       Assist Level: Maximal Assistance - Patient 25 - 49%   Blood pressure 124/74, pulse 83, temperature 99 F (37.2 C), temperature source Oral, resp. rate 16, height 6' (1.829 m), weight 69 kg, SpO2 97 %.  Medical Problem List and Plan: 1.  Left parafalcine subdural hematoma and subarachnoid hemorrhage, Left MCA M3 branch embolic stroke   Continue CIR 2.  Antithrombotics: -DVT/anticoagulation:  Mechanical: Sequential compression devices, below knee Bilateral lower extremities--Follow up CT head in 2 weeks to decide on resuming AC.              -antiplatelet therapy: N/A 3. Pain Management: N/a 4. Mood: LCSW to follow for evaluation and support.              -antipsychotic agents: N/A 5. Neuropsych: This patient is capable of making decisions on his own behalf. 6. Skin/Wound Care: Routine pressure relief measures.  7. Fluids/Electrolytes/Nutrition: Monitor I/Os. Continue Vitamin B12 supplement 8. Acute on chronic CHF: On Coreg , lipitor, imdur and Lasix.  Monitor for signs of overload--monitor weights daily.  Filed Weights   06/23/19 0500 06/24/19 0506 06/25/19 0308  Weight: 67.8 kg 67.9 kg 69 kg   Relatively stable on 3/28 9. CAD/CAF/ s/p AICD: Continues imdur, Coreg and lipitor. Monitor HR tid.  Vitals:   06/25/19 0302 06/25/19 0308  BP: 124/74 124/74  Pulse: 83 83  Resp: 16 16  Temp: 99 F (37.2 C) 99 F (37.2 C)  SpO2: 97% 97%   Controlled on 3/20 10. Hypokalemia:   Potassium 3.6 on 3/22, labs ordered for tomorrow 11. ABLA: Resolved 12. Macular degeneration: Legally blind.  Needs magnifying glass to read normal print  13. Sleep disturbance: Trazodone 25mg  HS PRN 14.  Post stroke dysphagia-D3 thins liq - advance per SLP- still requires supervision  15. Heat rash: Has benadryl prn; used at 2am last night.  Continue Sarna lotion and Triamcinolone ointment BID.  16. Aphasia- mainly expressive mild, SLP  17.  Acute lower UTI  UA +, urine culture still has not been collected  from 3/26, reordered  Empiric Macrobid started on 3/27  LOS: 11 days A FACE TO FACE EVALUATION WAS PERFORMED  Avrielle Fry Lorie Phenix 06/25/2019, 9:12 AM

## 2019-06-25 NOTE — Progress Notes (Addendum)
Brief note:   Patient with loose stools with Macrobic, changed to keflex.

## 2019-06-26 ENCOUNTER — Inpatient Hospital Stay (HOSPITAL_COMMUNITY): Payer: Medicare Other | Admitting: Speech Pathology

## 2019-06-26 ENCOUNTER — Inpatient Hospital Stay (HOSPITAL_COMMUNITY): Payer: Medicare Other | Admitting: Occupational Therapy

## 2019-06-26 ENCOUNTER — Inpatient Hospital Stay (HOSPITAL_COMMUNITY): Payer: Medicare Other

## 2019-06-26 ENCOUNTER — Inpatient Hospital Stay (HOSPITAL_COMMUNITY): Payer: Medicare Other | Admitting: Physical Therapy

## 2019-06-26 LAB — URINE CULTURE

## 2019-06-26 LAB — BASIC METABOLIC PANEL
Anion gap: 10 (ref 5–15)
BUN: 10 mg/dL (ref 8–23)
CO2: 22 mmol/L (ref 22–32)
Calcium: 9 mg/dL (ref 8.9–10.3)
Chloride: 95 mmol/L — ABNORMAL LOW (ref 98–111)
Creatinine, Ser: 1 mg/dL (ref 0.61–1.24)
GFR calc Af Amer: >60 mL/min (ref >60)
GFR calc non Af Amer: >60 mL/min (ref >60)
Glucose, Bld: 107 mg/dL — ABNORMAL HIGH (ref 70–99)
Potassium: 4.2 mmol/L (ref 3.5–5.1)
Sodium: 127 mmol/L — ABNORMAL LOW (ref 135–145)

## 2019-06-26 LAB — CBC
HCT: 37.4 % — ABNORMAL LOW (ref 39.0–52.0)
Hemoglobin: 12.3 g/dL — ABNORMAL LOW (ref 13.0–17.0)
MCH: 28.9 pg (ref 26.0–34.0)
MCHC: 32.9 g/dL (ref 30.0–36.0)
MCV: 88 fL (ref 80.0–100.0)
Platelets: 204 10*3/uL (ref 150–400)
RBC: 4.25 MIL/uL (ref 4.22–5.81)
RDW: 15 % (ref 11.5–15.5)
WBC: 7.6 10*3/uL (ref 4.0–10.5)
nRBC: 0 % (ref 0.0–0.2)

## 2019-06-26 NOTE — Progress Notes (Signed)
Patient on sleep chart but up the majority of shift, very little rest noted, incontinent episodes after replacing condom catheter x2,  Benadryl po administered for c/o of itching, made as comfortable as possible, monitor

## 2019-06-26 NOTE — Progress Notes (Addendum)
Urine culture obtained via external condom cath and sent to lab per order

## 2019-06-26 NOTE — Plan of Care (Signed)
  Problem: Consults Goal: RH STROKE PATIENT EDUCATION Description: See Patient Education module for education specifics  Outcome: Progressing   Problem: RH BOWEL ELIMINATION Goal: RH STG MANAGE BOWEL WITH ASSISTANCE Description: STG Manage Bowel with Assistance. Outcome: Progressing Goal: RH STG MANAGE BOWEL W/MEDICATION W/ASSISTANCE Description: STG Manage Bowel with Medication with Assistance. Outcome: Progressing   Problem: RH BLADDER ELIMINATION Goal: RH STG MANAGE BLADDER WITH ASSISTANCE Description: STG Manage Bladder With Assistance Outcome: Progressing Goal: RH STG MANAGE BLADDER WITH MEDICATION WITH ASSISTANCE Description: STG Manage Bladder With Medication With Assistance. Outcome: Progressing   Problem: RH SKIN INTEGRITY Goal: RH STG SKIN FREE OF INFECTION/BREAKDOWN Outcome: Progressing Goal: RH STG MAINTAIN SKIN INTEGRITY WITH ASSISTANCE Description: STG Maintain Skin Integrity With Assistance. Outcome: Progressing   Problem: RH SAFETY Goal: RH STG ADHERE TO SAFETY PRECAUTIONS W/ASSISTANCE/DEVICE Description: STG Adhere to Safety Precautions With Assistance/Device. Outcome: Progressing Goal: RH STG DECREASED RISK OF FALL WITH ASSISTANCE Description: STG Decreased Risk of Fall With Assistance. Outcome: Progressing   Problem: RH PAIN MANAGEMENT Goal: RH STG PAIN MANAGED AT OR BELOW PT'S PAIN GOAL Outcome: Progressing   Problem: RH KNOWLEDGE DEFICIT Goal: RH STG INCREASE KNOWLEDGE OF DIABETES Outcome: Progressing Goal: RH STG INCREASE KNOWLEDGE OF HYPERTENSION Outcome: Progressing Goal: RH STG INCREASE KNOWLEGDE OF HYPERLIPIDEMIA Outcome: Progressing Goal: RH STG INCREASE KNOWLEDGE OF STROKE PROPHYLAXIS Outcome: Progressing

## 2019-06-26 NOTE — Progress Notes (Signed)
Occupational Therapy Session Note  Patient Details  Name: Mark Clayton MRN: [id number] Date of Birth: [birth year]  Today's Date: 06/26/2019 OT Individual Time: 1033-1100 OT Individual Time Calculation (min): 27 min    Short Term Goals: Week 2:  OT Short Term Goal 1 (Week 2): Pt will perform LB dressing with mod A overall. OT Short Term Goal 2 (Week 2): Pt will perform UB dressing with mod A overall. OT Short Term Goal 3 (Week 2): Pt will perform toilet transfer with mod A overall. OT Short Term Goal 4 (Week 2): Pt will complete bed mobility with MIN A overall.  Skilled Therapeutic Interventions/Progress Updates:    Upon entering the room, pt supine in bed and on the phone with family member. Pt was agreeable to OT intervention but needing encouragement secondary to pt being upset and description of having a bad night. Pt did not sleep well but is still willing to attempt therapy session. Focus is on use of yellow resistive theraputty for R hand strengthening and coordination. Putty placed on rolling tray table and pt reports being able to locate it better as it is against dark background. Pt needing min cuing for technique but able to complete tasks with increased time. Pt requesting to keep table pulled over him and to continue working with putty even after therapist leaves. Call bell and all needed items within reach. Bed alarm activated.   Therapy Documentation Precautions:  Precautions Precautions: Fall Precaution Comments: R hemiparesis Restrictions Weight Bearing Restrictions: No Vital Signs: Therapy Vitals Pulse Rate: 72 BP: 109/78 Pain: Pain Assessment Pain Scale: 0-10 Pain Score: 0-No pain   Therapy/Group: Individual Therapy  Gypsy Decant 06/26/2019, 12:49 PM

## 2019-06-26 NOTE — Progress Notes (Signed)
Patient remains on Sleep chart and has only slept a total of 1-2 hrs this shift

## 2019-06-26 NOTE — Progress Notes (Signed)
Occupational Therapy Session Note  Patient Details  Name: Mark Clayton MRN: [id number] Date of Birth: Nov 05, 1940  Today's Date: 06/26/2019 OT Individual Time: 0200-0300 OT Individual Time Calculation (min): 60 min    Short Term Goals: Week 2:  OT Short Term Goal 1 (Week 2): Pt will perform LB dressing with mod A overall. OT Short Term Goal 2 (Week 2): Pt will perform UB dressing with mod A overall. OT Short Term Goal 3 (Week 2): Pt will perform toilet transfer with mod A overall. OT Short Term Goal 4 (Week 2): Pt will complete bed mobility with MIN A overall.  Skilled Therapeutic Interventions/Progress Updates:  Pt received supine in bed with sister present agreeable to OT intervention. Pt reports needing to have a BM. Pt required MOD A for bed mobility needing cues to recall compensatory method to advance BLEs to EOB and MODA to elevate trunk and scoot hips to EOB. Pt completed squat pivot transfer to pts L side with MODA. Pt transported to BR for toileting with total A from w/c. MAX A for stand pivot transfer to [facility] to pts L side. Pt sit<>stand from [facility] with RW and MIN A while OTA provided total A for posterior pericare and donning clean brief. Pt required MAX A for LB Dressing to don pants from w/c. Pt able to pull pants up to waist line needing MIN A for sit<>stand from w/c with RW needing MOD A to maintain standing balance. Pt transported to therapy gym in w/c with total A. Pt transferred to mat table in similar fashion as previously indicated. Pt completed seated RUE therex where pt instructed to retrieve cup with RUE on R side, cross midline to place cup on tray table to L. Pt required initial hand over hand assist to maintain cylindrical grasp. Pt completed x4 reps with assist to stabilize cup once placed on table. Pt completed modified crunched with therapy ball positioned behind pt to facilitate core strength for bed mobility. Pt completed 2 sets x10 reps needing 1 min rest break in  between. Pt completed x3 sit<>stands from mat table with RW and MIN- MODA to facilitate weight shifting as precursor to functional stepping. Pt transported back to room in w/c with total A where pt requested returning to supine. Pt completed bed mobility in similar fashion as previously indicated.  Pt left supine in bed with bed alarm activated and all needs within reach.   Therapy Documentation Precautions:  Precautions Precautions: Fall Precaution Comments: R hemiparesis Restrictions Weight Bearing Restrictions: No General:   Vital Signs:  Pain: Pt reports no pain during session.   Therapy/Group: Individual Therapy  [name] 06/26/2019, 3:31 PM

## 2019-06-26 NOTE — Progress Notes (Signed)
Physical Therapy Session Note  Patient Details  Name: Mark Clayton MRN: [id number] Date of Birth: 1940/08/10  Today's Date: 06/26/2019 PT Individual Time: 0805-0900 PT Individual Time Calculation (min): 55 min   Short Term Goals: Week 2:  PT Short Term Goal 1 (Week 2): PT will transfer to [facility] with min assist consistently PT Short Term Goal 2 (Week 2): Pt will ambulate with mod assist x 45ft and LRAD PT Short Term Goal 3 (Week 2): Pt will propell WC 170ft with supervision assist PT Short Term Goal 4 (Week 2): Pt will perform bed mobility with min assist consistently  Skilled Therapeutic Interventions/Progress Updates: Pt presented in bed with NT present completing toileting agreeable to therapy. Pt performed supine to sit at EOB with modA for RLE management and truncal support with cues for use of LUE to facilitate trunk control. PTA threaded pants and donned shirt maxA for time management. Performed STS with RW minA as PTA pulled pants over hips. Noted no R knee hyperextension on standing and pt was able to wt shift to R. Performed stand pivot with RW with pt able to initiate RLE towards w/c! Due to pt's poor proprioception pt required maxA for placement and to maintain RLE on ground while completing turn towards d/c. Pt then set up with breakfast and PTA supervision meal remaining pt to take hard swallow after several bites. Pt then transported to rehab gym and participated in STS x 5 in parallel bars then attempting toe taps to target in parallel bars with RLE. Pt with increased difficulty activating hip flexors and wt shifting to L. Pt thus transferred to hallway and ambulated with wall rail with PTA facilitating wt shifting to L and facilitation behind knee to allow PTA to advance RLE. Pt then indicated need for BM. PT transported back to room at end of session and obtained Stedy as pt wished to use BSC. Pt left in care of Zach, NT to address needs.      Therapy Documentation Precautions:   Precautions Precautions: Fall Precaution Comments: R hemiparesis Restrictions Weight Bearing Restrictions: No General:   Vital Signs: Therapy Vitals Pulse Rate: 72 BP: 109/78 Pain: Pain Assessment Pain Scale: 0-10 Pain Score: 0-No pain Mobility:   Locomotion :    Trunk/Postural Assessment :    Balance:   Exercises:   Other Treatments:      Therapy/Group: Individual Therapy  Grace Haggart 06/26/2019, 12:47 PM

## 2019-06-26 NOTE — Progress Notes (Signed)
Speech Language Pathology Daily Session Note  Patient Details  Name: Mark Clayton MRN: [id number] Date of Birth: 04/21/1940  Today's Date: 06/26/2019 SLP Individual Time: 1101-1200 SLP Individual Time Calculation (min): 59 min  Short Term Goals: Week 2: SLP Short Term Goal 1 (Week 2): Pt will consume current diet with minimal s/sx aspiration and efficient mastication and oral clearance with use of recommended swallow stategies with Supervision A verbal/visual cues. SLP Short Term Goal 2 (Week 2): Pt will detect and correct semantic and/or phonemic errors with Supervision A multimodal cues. SLP Short Term Goal 3 (Week 2): Pt will detect and correct semantic and/or phonemic errors with Supervisoin A multimodal cues. SLP Short Term Goal 4 (Week 2): Pt will use increased vocal intensity to achieve 90% intellgibility at the converstaion level with Supervision A cues. SLP Short Term Goal 5 (Week 2): Pt will susatin attention to tasks with Supervision A verbal cues for redirection. SLP Short Term Goal 6 (Week 2): Pt will anticipate d/c need including ADLs which he will require assistance with and/or equipment he will need with Min A cues.  Skilled Therapeutic Interventions: Pt was seen for skilled ST targeting dysphagia and speech goals. Pt was Mod I for verbal recall and implementation of swallow strategies during intake of Dys 3 (mech soft) thin liquid snack. Fully efficient mastication, oral transit and clearance of solids noted, no overt s/sx aspiration observed across liquids or solids. Pt also able to demonstrate how to adjust himself to sitting upright in bed for optimal positioning during intake. Therefore, recommend continue current diet but decrease to set up and intermittent supervision during meals. Pt may be allowed to keep thins within reach at bedside for his consumption throughout the day. RN made aware. Cognitive interventions focused on sustained attention and functional problem solving.  Mod decreasing to Min A verbal cues were required for redirection to tasks throughout session. Pt is somewhat verbose, requiring reminders to stay on topic. Pt also required overall Min A for problem solving during a basic 3-step action card sequencing task. Pt with no difficulty seeing large pictures today in the correct lighting, despite macular degeneration, as SLP had pt describe pictures prior to sequencing to ensure visual deficits would not interfere. Pt left laying in bed with alarm set and needs met to his satisfaction. Continue per current plan of care.        Pain Pain Assessment Pain Scale: 0-10 Pain Score: 0-No pain  Therapy/Group: Individual Therapy   E  06/26/2019, 7:06 AM   

## 2019-06-26 NOTE — Progress Notes (Signed)
PHYSICAL MEDICINE & REHABILITATION PROGRESS NOTE   Subjective/Complaints:  No issues overnite , no more diarrhea, 2/10 burning with urination  ROS: Denies CP, SOB, N/V/D  Objective:   No results found. No results for input(s): WBC, HGB, HCT, PLT in the last 72 hours. No results for input(s): NA, K, CL, CO2, GLUCOSE, BUN, CREATININE, CALCIUM in the last 72 hours.  Intake/Output Summary (Last 24 hours) at 06/26/2019 0754 Last data filed at 06/26/2019 0615 Gross per 24 hour  Intake 770 ml  Output 1750 ml  Net -980 ml     Physical Exam: Vital Signs Blood pressure 134/84, pulse 79, temperature (!) 97.4 F (36.3 C), temperature source Oral, resp. rate 16, height 6' (1.829 m), weight 68.8 kg, SpO2 96 %.  General: No acute distress Mood and affect are appropriate Heart: Regular rate and rhythm no rubs murmurs or extra sounds Lungs: Clear to auscultation, breathing unlabored, no rales or wheezes Abdomen: Positive bowel sounds, soft nontender to palpation, nondistended Extremities: No clubbing, cyanosis, or edema Skin: No evidence of breakdown, no evidence of rash Sensation intact LT RLE HOH Motor: RUE: Shoulder adduction 3 -/5, elbow flexion/extension 3+/5, handgrip 2/5  LUE: 4+/5 proximal distal LLE: 4+/5 proximal distal RLE: 2 -/5 proximal to distal  Assessment/Plan: 1. Functional deficits secondary to CVA L MCA and subdural  which require 3+ hours per day of interdisciplinary therapy in a comprehensive inpatient rehab setting.  Physiatrist is providing close team supervision and 24 hour management of active medical problems listed below.  Physiatrist and rehab team continue to assess barriers to discharge/monitor patient progress toward functional and medical goals  Care Tool:  Bathing  Bathing activity did not occur: Refused Body parts bathed by patient: Face, Right arm, Chest, Abdomen, Front perineal area, Right upper leg, Left upper leg   Body parts bathed  by helper: Left arm, Buttocks, Right lower leg, Left lower leg Body parts n/a: Front perineal area, Buttocks, Right lower leg, Left lower leg   Bathing assist Assist Level: Moderate Assistance - Patient 50 - 74%     Upper Body Dressing/Undressing Upper body dressing   What is the patient wearing?: Pull over shirt    Upper body assist Assist Level: Moderate Assistance - Patient 50 - 74%    Lower Body Dressing/Undressing Lower body dressing      What is the patient wearing?: Pants(shorts)     Lower body assist Assist for lower body dressing: Maximal Assistance - Patient 25 - 49%     Toileting Toileting    Toileting assist Assist for toileting: Dependent - Patient 0%     Transfers Chair/bed transfer  Transfers assist     Chair/bed transfer assist level: Moderate Assistance - Patient 50 - 74%     Locomotion Ambulation   Ambulation assist   Ambulation activity did not occur: Safety/medical concerns  Assist level: Moderate Assistance - Patient 50 - 74% Assistive device: Walker-rolling Max distance: 25   Walk 10 feet activity   Assist  Walk 10 feet activity did not occur: Safety/medical concerns  Assist level: Moderate Assistance - Patient - 50 - 74% Assistive device: Walker-rolling, Orthosis   Walk 50 feet activity   Assist Walk 50 feet with 2 turns activity did not occur: Safety/medical concerns         Walk 150 feet activity   Assist Walk 150 feet activity did not occur: Safety/medical concerns         Walk 10 feet on uneven surface  activity   Assist Walk 10 feet on uneven surfaces activity did not occur: Safety/medical concerns         Wheelchair     Assist Will patient use wheelchair at discharge?: Yes(LT goals set)      Wheelchair assist level: Supervision/Verbal cueing Max wheelchair distance: 50    Wheelchair 50 feet with 2 turns activity    Assist        Assist Level: Supervision/Verbal cueing    Wheelchair 150 feet activity     Assist      Assist Level: Maximal Assistance - Patient 25 - 49%   Blood pressure 134/84, pulse 79, temperature (!) 97.4 F (36.3 C), temperature source Oral, resp. rate 16, height 6' (1.829 m), weight 68.8 kg, SpO2 96 %.  Medical Problem List and Plan: 1.  Left parafalcine subdural hematoma and subarachnoid hemorrhage, Left MCA M3 branch embolic stroke   Continue CIR PT, OT. SLP  2.  Antithrombotics: -DVT/anticoagulation:  Mechanical: Sequential compression devices, below knee Bilateral lower extremities--Follow up CT head in 2 weeks to decide on resuming AC.              -antiplatelet therapy: N/A 3. Pain Management: N/a 4. Mood: LCSW to follow for evaluation and support.              -antipsychotic agents: N/A 5. Neuropsych: This patient is capable of making decisions on his own behalf. 6. Skin/Wound Care: Routine pressure relief measures.  7. Fluids/Electrolytes/Nutrition: Monitor I/Os. Continue Vitamin B12 supplement 8. Acute on chronic CHF: On Coreg , lipitor, imdur and Lasix.  Monitor for signs of overload--monitor weights daily.  Filed Weights   06/24/19 0506 06/25/19 0308 06/26/19 0326  Weight: 67.9 kg 69 kg 68.8 kg   Relatively stable on 3/29 9. CAD/CAF/ s/p AICD: Continues imdur, Coreg and lipitor. Monitor HR tid.  Vitals:   06/25/19 1936 06/26/19 0326  BP: 105/63 134/84  Pulse: 87 79  Resp: 17 16  Temp: 98.3 F (36.8 C) (!) 97.4 F (36.3 C)  SpO2: 98% 96%   Controlled on 3/29 10. Hypokalemia:   Potassium 3.6 on 3/22, labs ordered for tomorrow 11. ABLA: Resolved 12. Macular degeneration: Legally blind.  Needs magnifying glass to read normal print  13. Sleep disturbance: Trazodone 25mg  HS PRN 14.  Post stroke dysphagia-D3 thins liq - advance per SLP- still requires supervision  15. Heat rash: Has benadryl prn; used at 2am last night.  Continue Sarna lotion and Triamcinolone ointment BID.  16. Aphasia- mainly expressive  mild, SLP  17.  Acute lower UTI  UA +, urine culture still has not been collected from 3/26, reordered  Empiric Macrobid started on 3/27 switched to Keflex because of diarrhea   LOS: 12 days A FACE TO [place] E Stephano Arrants 06/26/2019, 7:54 AM

## 2019-06-27 ENCOUNTER — Inpatient Hospital Stay (HOSPITAL_COMMUNITY): Payer: Medicare Other | Admitting: Speech Pathology

## 2019-06-27 ENCOUNTER — Inpatient Hospital Stay (HOSPITAL_COMMUNITY): Payer: Medicare Other

## 2019-06-27 ENCOUNTER — Inpatient Hospital Stay (HOSPITAL_COMMUNITY): Payer: Medicare Other | Admitting: Occupational Therapy

## 2019-06-27 ENCOUNTER — Inpatient Hospital Stay (HOSPITAL_COMMUNITY): Payer: Medicare Other | Admitting: Physical Therapy

## 2019-06-27 NOTE — Progress Notes (Signed)
Patient slept most of the night. No issues noted with his catheter. He went to radiology this morning. No c/o pain or discomfort. Will continue to monitor & pass on to oncoming shift.

## 2019-06-27 NOTE — Progress Notes (Signed)
Physical Therapy Session Note  Patient Details  Name: Mark Clayton MRN: [id number] Date of Birth: [birth year]  Today's Date: 06/27/2019 PT Individual Time: 0803-0915 PT Individual Time Calculation (min): 72 min   Short Term Goals: Week 2:  PT Short Term Goal 1 (Week 2): PT will transfer to [facility] with min assist consistently PT Short Term Goal 2 (Week 2): Pt will ambulate with mod assist x 27f and LRAD PT Short Term Goal 3 (Week 2): Pt will propell WC 1048fwith supervision assist PT Short Term Goal 4 (Week 2): Pt will perform bed mobility with min assist consistently  Skilled Therapeutic Interventions/Progress Updates: Pt presented in bed agreeable to therapy. Pt denies pain during session. Performed rolling to R with supervision to allow PTA to refasten brief. Performed supine to sit at EOB with modA (near min) and mod cues for sequencing. Donned pants and shoes total A for time management. Performed STS minA to allow PTA to pull pants over brief. Performed stand pivot transfer to L with RW maxA as pt was able to step with LLE however unable to activate glute/hamstring to pull leg backwards. Pt then moved to sink to wash face and comb hair with set up. Pt transported to day room and performed squat pivot transfer to NuStep. Participated in NuStep L1 x 8 min for reciprocal activity and general conditioning with pt able to maintain avg pace 40 SPM. Pt then transferred back to w/c in same manner as prior. Pt transported to rehab gym and performed squat pivot to mat minA. Participated in gait training x [id number] RW and theraband placed around RLE. Pt required maxA for turning to R to sit at mat. Performed stand pivot to w/c modA and pt propelled aprox [id number] hemi technique supervision but initially requiring verbal cues for sequencing. Pt agreeable to remain in w/c to eat breakfast as stressed importance of increasing OOB tolerance. Pt left in w/c refusing belt alarm with call bell within reach and  needs met.      Therapy Documentation Precautions:  Precautions Precautions: Fall Precaution Comments: R hemiparesis Restrictions Weight Bearing Restrictions: No General:   Vital Signs:   Pain: Pain Assessment Pain Scale: 0-10 Pain Score: 0-No pain Mobility:   Locomotion :    Trunk/Postural Assessment :    Balance:   Exercises:   Other Treatments:      Therapy/Group: Individual Therapy  Duane Earnshaw 06/27/2019, 12:42 PM

## 2019-06-27 NOTE — Progress Notes (Signed)
Occupational Therapy Session Note  Patient Details  Name: Mark Clayton MRN: [id number] Date of Birth: 1940/05/14  Today's Date: 06/27/2019 OT Individual Time: 0215-0300 OT Individual Time Calculation (min): 45 min    Short Term Goals: Week 2:  OT Short Term Goal 1 (Week 2): Pt will perform LB dressing with mod A overall. OT Short Term Goal 2 (Week 2): Pt will perform UB dressing with mod A overall. OT Short Term Goal 3 (Week 2): Pt will perform toilet transfer with mod A overall. OT Short Term Goal 4 (Week 2): Pt will complete bed mobility with MIN A overall.  Skilled Therapeutic Interventions/Progress Updates:  Pt asleep upon OTA arrival however easily able to to arouse and agreeable to OT intervention. Pt completed bed mobility with CGA able to carryover compensatory technique of advancing BLEs to EOB. Pt completed seated RUE therex with level 2 putty to increase strength and [facility] for ADL participation. Pt utilized scapular protraction/ retraction, composite digit flexion, and pincer grasp during task. Pt completed x5 sit<>stand from EOB with RW needing MIN- MODA to power into standing and maintain initial balance. Worked on weight shift R<>L as precursor for functional stepping. Pt requested to don shirt at end of session needing MAX A from EOB. Pt required MOD A to transition back to supine needing assist to elevated BLEs back to bed. Pt left supine in bed with bed alarm activated and all needs within reach.   Therapy Documentation Precautions:  Precautions Precautions: Fall Precaution Comments: R hemiparesis Restrictions Weight Bearing Restrictions: No General:   Vital Signs:   Pain: Pt reports pain from cramping in L leg x2; provided increased seated rest breaks.   Therapy/Group: Individual Therapy  [name] 06/27/2019, 3:58 PM

## 2019-06-27 NOTE — Progress Notes (Signed)
[place] PHYSICAL MEDICINE & REHABILITATION PROGRESS NOTE   Subjective/Complaints:   ROS: Denies CP, SOB, N/V/D  Objective:   No results found. Recent Labs    06/26/19 0719  WBC 7.6  HGB 12.3*  HCT 37.4*  PLT 204   Recent Labs    06/26/19 0719  NA 127*  K 4.2  CL 95*  CO2 22  GLUCOSE 107*  BUN 10  CREATININE 1.00  CALCIUM 9.0    Intake/Output Summary (Last 24 hours) at 06/27/2019 0733 Last data filed at 06/27/2019 0331 Gross per 24 hour  Intake 600 ml  Output 850 ml  Net -250 ml     Physical Exam: Vital Signs Blood pressure 134/62, pulse 71, temperature 98 F (36.7 C), temperature source Oral, resp. rate 18, height 6' (1.829 m), weight 65.6 kg, SpO2 99 %.  General: No acute distress Mood and affect are appropriate Heart: Regular rate and rhythm no rubs murmurs or extra sounds Lungs: Clear to auscultation, breathing unlabored, no rales or wheezes Abdomen: Positive bowel sounds, soft nontender to palpation, nondistended Extremities: No clubbing, cyanosis, or edema Skin: No evidence of breakdown, no evidence of rash Sensation intact LT RLE HOH Motor: RUE: Shoulder adduction 3 -/5, elbow flexion/extension 3+/5, handgrip 2/5  LUE: 4+/5 proximal distal LLE: 4+/5 proximal distal RLE: 2 -/5 proximal to distal  Assessment/Plan: 1. Functional deficits secondary to CVA L MCA and subdural  which require 3+ hours per day of interdisciplinary therapy in a comprehensive inpatient rehab setting.  Physiatrist is providing close team supervision and 24 hour management of active medical problems listed below.  Physiatrist and rehab team continue to assess barriers to discharge/monitor patient progress toward functional and medical goals  Care Tool:  Bathing  Bathing activity did not occur: Refused Body parts bathed by patient: Face, Right arm, Chest, Abdomen, Front perineal area, Right upper leg, Left upper leg   Body parts bathed by helper: Left arm, Buttocks,  Right lower leg, Left lower leg Body parts n/a: Front perineal area, Buttocks, Right lower leg, Left lower leg   Bathing assist Assist Level: Moderate Assistance - Patient 50 - 74%     Upper Body Dressing/Undressing Upper body dressing   What is the patient wearing?: Pull over shirt    Upper body assist Assist Level: Moderate Assistance - Patient 50 - 74%    Lower Body Dressing/Undressing Lower body dressing      What is the patient wearing?: Pants, Underwear/pull up     Lower body assist Assist for lower body dressing: Maximal Assistance - Patient 25 - 49%     Toileting Toileting    Toileting assist Assist for toileting: Maximal Assistance - Patient 25 - 49%     Transfers Chair/bed transfer  Transfers assist     Chair/bed transfer assist level: Moderate Assistance - Patient 50 - 74%     Locomotion Ambulation   Ambulation assist   Ambulation activity did not occur: Safety/medical concerns  Assist level: Moderate Assistance - Patient 50 - 74% Assistive device: Walker-rolling Max distance: 25   Walk 10 feet activity   Assist  Walk 10 feet activity did not occur: Safety/medical concerns  Assist level: Moderate Assistance - Patient - 50 - 74% Assistive device: Walker-rolling, Orthosis   Walk 50 feet activity   Assist Walk 50 feet with 2 turns activity did not occur: Safety/medical concerns         Walk 150 feet activity   Assist Walk 150 feet activity did not  occur: Safety/medical concerns         Walk 10 feet on uneven surface  activity   Assist Walk 10 feet on uneven surfaces activity did not occur: Safety/medical concerns         Wheelchair     Assist Will patient use wheelchair at discharge?: Yes(LT goals set)      Wheelchair assist level: Supervision/Verbal cueing Max wheelchair distance: 50    Wheelchair 50 feet with 2 turns activity    Assist        Assist Level: Supervision/Verbal cueing   Wheelchair  150 feet activity     Assist      Assist Level: Maximal Assistance - Patient 25 - 49%   Blood pressure 134/62, pulse 71, temperature 98 F (36.7 C), temperature source Oral, resp. rate 18, height 6' (1.829 m), weight 65.6 kg, SpO2 99 %.  Medical Problem List and Plan: 1.  Left parafalcine subdural hematoma and subarachnoid hemorrhage, Left MCA M3 branch embolic stroke   Continue CIR PT, OT. SLP  2.  Antithrombotics: -DVT/anticoagulation:  Mechanical: Sequential compression devices, below knee Bilateral lower extremities--Follow up CT head in 2 weeks to decide on resuming AC.              -antiplatelet therapy: N/A 3. Pain Management: N/a 4. Mood: LCSW to follow for evaluation and support.              -antipsychotic agents: N/A 5. Neuropsych: This patient is capable of making decisions on his own behalf. 6. Skin/Wound Care: Routine pressure relief measures.  7. Fluids/Electrolytes/Nutrition: Monitor I/Os. Continue Vitamin B12 supplement 8. Acute on chronic CHF: On Coreg , lipitor, imdur and Lasix.  Monitor for signs of overload--monitor weights daily.  Filed Weights   06/25/19 0308 06/26/19 0326 06/27/19 0306  Weight: 69 kg 68.8 kg 65.6 kg   Relatively stable on 3/29 9. CAD/CAF/ s/p AICD: Continues imdur, Coreg and lipitor. Monitor HR tid.  Vitals:   06/27/19 0306 06/27/19 0355  BP: 112/71 134/62  Pulse: 77 71  Resp: 16 18  Temp: 98.1 F (36.7 C) 98 F (36.7 C)  SpO2: 100% 99%   Controlled on 3/30  10. Hypokalemia:   Resolved  11. ABLA: Resolved 12. Macular degeneration: Legally blind.  Needs magnifying glass to read normal print  13. Sleep disturbance: Trazodone 25mg  HS PRN 14.  Post stroke dysphagia-D3 thins liq - advance per SLP- still requires supervision  15. Heat rash: Has benadryl prn; used at 2am last night.  Continue Sarna lotion and Triamcinolone ointment BID.  16. Aphasia- mainly expressive mild, SLP  17.  Acute lower UTI  UA +, urine culture still has not  been collected from 3/26, reordered  Empiric Macrobid started on 3/27 switched to Keflex because of diarrhea  18.  Hyponatremia- LAsix, ?SIADH , check serum and urine osmolality  LOS: 13 days A FACE TO FACE EVALUATION WAS PERFORMED  Charlett Blake 06/27/2019, 7:33 AM

## 2019-06-27 NOTE — Progress Notes (Signed)
Speech Language Pathology Daily Session Note  Patient Details  Name: Mark Clayton MRN: [id number] Date of Birth: [birth year]  Today's Date: 06/27/2019 SLP Individual Time: 1037-1100 SLP Individual Time Calculation (min): 23 min  Short Term Goals: Week 2: SLP Short Term Goal 1 (Week 2): Pt will consume current diet with minimal s/sx aspiration and efficient mastication and oral clearance with use of recommended swallow stategies with Supervision A verbal/visual cues. SLP Short Term Goal 2 (Week 2): Pt will detect and correct semantic and/or phonemic errors with Supervision A multimodal cues. SLP Short Term Goal 3 (Week 2): Pt will detect and correct semantic and/or phonemic errors with Supervisoin A multimodal cues. SLP Short Term Goal 4 (Week 2): Pt will use increased vocal intensity to achieve 90% intellgibility at the converstaion level with Supervision A cues. SLP Short Term Goal 5 (Week 2): Pt will susatin attention to tasks with Supervision A verbal cues for redirection. SLP Short Term Goal 6 (Week 2): Pt will anticipate d/c need including ADLs which he will require assistance with and/or equipment he will need with Min A cues.  Skilled Therapeutic Interventions: Pt was seen for skilled ST targeting cognitive goals. Pt became frustrated with thearpist's attempts to engage him in functional conversation which required him to discuss current deficits and anticipate needs/how impairments might impact him at home. With much encouragement and prompting, pt identified a walker as equipment he may need as well as 1 potential challenge with entering through his front door. Pt frustrated he could not read time on clock on the wall because it was far away, but when therapist relocated clock to pt's bedside, he requested it be returned to the wall. Pt left sitting in bed with alarm set and needs within reach. Continue per current plan of care.           Pain Pain Assessment Pain Scale: 0-10 Pain  Score: 0-No pain  Therapy/Group: Individual Therapy  Arbutus Leas 06/27/2019, 7:07 AM

## 2019-06-27 NOTE — Progress Notes (Signed)
Occupational Therapy Session Note  Patient Details  Name: Mark Clayton MRN: [id number] Date of Birth: [birth year]  Today's Date: 06/27/2019 OT Individual Time: 1253-1346 OT Individual Time Calculation (min): 53 min    Short Term Goals: Week 2:  OT Short Term Goal 1 (Week 2): Pt will perform LB dressing with mod A overall. OT Short Term Goal 2 (Week 2): Pt will perform UB dressing with mod A overall. OT Short Term Goal 3 (Week 2): Pt will perform toilet transfer with mod A overall. OT Short Term Goal 4 (Week 2): Pt will complete bed mobility with MIN A overall.  Skilled Therapeutic Interventions/Progress Updates:    Pt supine in bed upon entering the room, pt with no c/o pain and agreeable to participate. Pt reports, " I messed up with my urinal and I need help." Pt's brief was wet. Pt declined shower but agreeable to washing at sink. Pt does report fatigue during this session. Pt performed supine >sit with min A. Pt standing from bed with min A and needing mod A for stand pivot transfer into wheelchair. OT assisted pt to sink for bathing tasks. Pt needing min cuing for sequencing and attention to task. Pt standing with mod A and min standing balance to wash buttocks and peri area. Pt standing for 4 minutes to wash and then therapist assisted with fastening brief. Pt returning to wheelchair and brushing teeth with set up A while therapist assisted with washing his hair. Pt donning hospital gown secondary to fatigue and returned to bed at end of session with mod A stand pivot transfer. Bed alarm activated and call bell within reach.    Therapy Documentation Precautions:  Precautions Precautions: Fall Precaution Comments: R hemiparesis Restrictions Weight Bearing Restrictions: No   Therapy/Group: Individual Therapy  Gypsy Decant 06/27/2019, 4:10 PM

## 2019-06-28 ENCOUNTER — Inpatient Hospital Stay (HOSPITAL_COMMUNITY): Payer: Medicare Other | Admitting: Speech Pathology

## 2019-06-28 ENCOUNTER — Inpatient Hospital Stay (HOSPITAL_COMMUNITY): Payer: Medicare Other | Admitting: Occupational Therapy

## 2019-06-28 ENCOUNTER — Inpatient Hospital Stay (HOSPITAL_COMMUNITY): Payer: Medicare Other | Admitting: Physical Therapy

## 2019-06-28 LAB — OSMOLALITY: Osmolality: 277 mOsm/kg (ref 275–295)

## 2019-06-28 LAB — OSMOLALITY, URINE: Osmolality, Ur: 203 mOsm/kg — ABNORMAL LOW (ref 300–900)

## 2019-06-28 MED ORDER — CLOPIDOGREL BISULFATE 75 MG PO TABS
75.0000 mg | ORAL_TABLET | Freq: Every day | ORAL | Status: DC
  Administered 2019-06-28 – 2019-07-08 (×11): 75 mg via ORAL
  Filled 2019-06-28 (×11): qty 1

## 2019-06-28 MED ORDER — RIVAROXABAN 20 MG PO TABS
20.0000 mg | ORAL_TABLET | Freq: Every day | ORAL | Status: DC
  Administered 2019-06-28 – 2019-07-07 (×10): 20 mg via ORAL
  Filled 2019-06-28 (×10): qty 1

## 2019-06-28 NOTE — Progress Notes (Signed)
Physical Therapy Session Note  Patient Details  Name: Mark Clayton MRN: [id number] Date of Birth: 12-22-1940  Today's Date: 06/28/2019 PT Individual Time: 1530-1640 PT Individual Time Calculation (min): 70 min   Short Term Goals: Week 2:  PT Short Term Goal 1 (Week 2): PT will transfer to [facility] with min assist consistently PT Short Term Goal 2 (Week 2): Pt will ambulate with mod assist x 49f and LRAD PT Short Term Goal 3 (Week 2): Pt will propell WC 1058fwith supervision assist PT Short Term Goal 4 (Week 2): Pt will perform bed mobility with min assist consistently  Skilled Therapeutic Interventions/Progress Updates:   Pt received supine in bed and agreeable to PT. Supine>sit transfer with min assist moderate cues for trunkal initiation on the R. Sit<>stand at EOB to don pants with min assist and LUE on bed rail. Pt reports need for possible BM. [facility] in [facility]edy with min assist. Continent bladder void, unable to void bowel. [place] with min assist to sustain grasp on hand rail. Pt transported to rehab gym. Forced use NMR through Gait training at rail in hall x 3084fith mod-max assist to advance the RLE. Improving activation of the RLE with increased distance to reduce extension response. PT attempted to don RAFO, but unable due to inadequate space in slip on shoe. PT applied DF wrap to RLE. Gait training with RW [id number] +[id number] mod assist overall to advance the RLE, improve L weight shift, and prevent GR. Noted increase in R lateral lean with fatigue. Pt required prolonged rest break following each bout of gait training. Pt returned to room and performed stand pivot transfer to bed with mod assist and LUE supported on bed raill. Sit>supine completed with min assist, and left supine in bed with call bell in reach and all needs met.        Therapy Documentation Precautions:  Precautions Precautions: Fall Precaution Comments: R  hemiparesis Restrictions Weight Bearing Restrictions: No    Vital Signs: Therapy Vitals Temp: 97.7 F (36.5 C) Temp Source: Oral Pulse Rate: 97 Resp: 16 BP: (!) 95/55 Patient Position (if appropriate): Sitting Oxygen Therapy SpO2: 100 % O2 Device: Room Air Pain: Pain Assessment Pain Scale: 0-10 Pain Score: 0-No pain    Therapy/Group: Individual Therapy  AustiLorie Phenix/2021, 5:12 PM

## 2019-06-28 NOTE — Progress Notes (Addendum)
[facility] PHYSICAL MEDICINE & REHABILITATION PROGRESS NOTE   Subjective/Complaints:  Discussed resolution of SDH with pt.  He is aphasic but acknowledged positive result  ROS: Denies CP, SOB, N/V/D  Objective:   CT HEAD WO CONTRAST  Result Date: 06/27/2019 CLINICAL DATA:  Follow-up intracranial hemorrhage and cerebral infarction. EXAM: CT HEAD WITHOUT CONTRAST TECHNIQUE: Contiguous axial images were obtained from the base of the skull through the vertex without intravenous contrast. COMPARISON:  Multiple prior head CTs dating back to 06/06/2019 FINDINGS: Brain: Resolution of intracranial hemorrhage including subarachnoid and subdural blood at the vertex. No new areas of hemorrhage are identified. Evolutionary changes in the left parietal infarct. No findings for new/acute infarction. The ventricles are in the midline without mass effect or shift and are stable in size and configuration. The brainstem and cerebellum are unremarkable and stable. Giant cisterna magna again noted. Vascular: Stable vascular calcifications. No hyperdense vessels or aneurysm. Skull: No skull fracture or bone lesions. Sinuses/Orbits: The paranasal sinuses and mastoid air cells are clear. The globes are intact. Other: No scalp lesions or hematoma. IMPRESSION: 1. Resolution of intracranial hemorrhage. 2. Evolutionary changes in the left parietal infarct. 3. No new/acute intracranial findings. Electronically Signed   By: Marijo Sanes M.D.   On: 06/27/2019 08:38   Recent Labs    06/26/19 0719  WBC 7.6  HGB 12.3*  HCT 37.4*  PLT 204   Recent Labs    06/26/19 0719  NA 127*  K 4.2  CL 95*  CO2 22  GLUCOSE 107*  BUN 10  CREATININE 1.00  CALCIUM 9.0    Intake/Output Summary (Last 24 hours) at 06/28/2019 0754 Last data filed at 06/27/2019 1825 Gross per 24 hour  Intake 360 ml  Output 1300 ml  Net -940 ml     Physical Exam: Vital Signs Blood pressure 138/86, pulse 77, temperature 98.4 F (36.9 C), resp.  rate 18, height 6' (1.829 m), weight 66.6 kg, SpO2 99 %.   General: No acute distress Mood and affect are appropriate Heart: Regular rate and rhythm no rubs murmurs or extra sounds Lungs: Clear to auscultation, breathing unlabored, no rales or wheezes Abdomen: Positive bowel sounds, soft nontender to palpation, nondistended Extremities: No clubbing, cyanosis, or edema Skin: No evidence of breakdown, no evidence of rash   HOH Motor: RUE: Shoulder adduction 3 -/5, elbow flexion/extension 3+/5, handgrip 2/5  LUE: 4+/5 proximal distal LLE: 4+/5 proximal distal RLE: 2 -/5 proximal to distal  Assessment/Plan: 1. Functional deficits secondary to CVA L MCA and subdural  which require 3+ hours per day of interdisciplinary therapy in a comprehensive inpatient rehab setting.  Physiatrist is providing close team supervision and 24 hour management of active medical problems listed below.  Physiatrist and rehab team continue to assess barriers to discharge/monitor patient progress toward functional and medical goals  Care Tool:  Bathing  Bathing activity did not occur: Refused Body parts bathed by patient: Face, Right arm, Chest, Abdomen, Front perineal area, Right upper leg, Left upper leg   Body parts bathed by helper: Left arm, Buttocks, Right lower leg, Left lower leg Body parts n/a: Front perineal area, Buttocks, Right lower leg, Left lower leg   Bathing assist Assist Level: Moderate Assistance - Patient 50 - 74%     Upper Body Dressing/Undressing Upper body dressing   What is the patient wearing?: Pull over shirt    Upper body assist Assist Level: Minimal Assistance - Patient > 75%    Lower Body Dressing/Undressing Lower  body dressing      What is the patient wearing?: Incontinence brief     Lower body assist Assist for lower body dressing: Moderate Assistance - Patient 50 - 74%     Toileting Toileting    Toileting assist Assist for toileting: Maximal Assistance -  Patient 25 - 49%     Transfers Chair/bed transfer  Transfers assist     Chair/bed transfer assist level: Moderate Assistance - Patient 50 - 74%     Locomotion Ambulation   Ambulation assist   Ambulation activity did not occur: Safety/medical concerns  Assist level: Moderate Assistance - Patient 50 - 74% Assistive device: Walker-rolling Max distance: 21f   Walk 10 feet activity   Assist  Walk 10 feet activity did not occur: Safety/medical concerns  Assist level: Moderate Assistance - Patient - 50 - 74% Assistive device: Walker-rolling, Orthosis   Walk 50 feet activity   Assist Walk 50 feet with 2 turns activity did not occur: Safety/medical concerns         Walk 150 feet activity   Assist Walk 150 feet activity did not occur: Safety/medical concerns         Walk 10 feet on uneven surface  activity   Assist Walk 10 feet on uneven surfaces activity did not occur: Safety/medical concerns         Wheelchair     Assist Will patient use wheelchair at discharge?: Yes(LT goals set)      Wheelchair assist level: Supervision/Verbal cueing Max wheelchair distance: 50    Wheelchair 50 feet with 2 turns activity    Assist        Assist Level: Supervision/Verbal cueing   Wheelchair 150 feet activity     Assist      Assist Level: Maximal Assistance - Patient 25 - 49%   Blood pressure 138/86, pulse 77, temperature 98.4 F (36.9 C), resp. rate 18, height 6' (1.829 m), weight 66.6 kg, SpO2 99 %.  Medical Problem List and Plan: 1.  Left parafalcine subdural hematoma and subarachnoid hemorrhage, Left MCA M3 branch embolic stroke   Continue CIR PT, OT. SLP Team conference today please see physician documentation under team conference tab, met with team  to discuss problems,progress, and goals. Formulized individual treatment plan based on medical history, underlying problem and comorbidities. 2.   Antithrombotics: -DVT/anticoagulation:DC Heparin   Mechanical: Sequential compression devices, below knee Bilateral lower extremities per Neuro Dr SLeonie Man ok to resume Xarelto, will consult pharmacy             -antiplatelet therapy: resuming Plavix  3. Pain Management: N/a 4. Mood: LCSW to follow for evaluation and support.              -antipsychotic agents: N/A 5. Neuropsych: This patient is capable of making decisions on his own behalf. 6. Skin/Wound Care: Routine pressure relief measures.  7. Fluids/Electrolytes/Nutrition: Monitor I/Os. Continue Vitamin B12 supplement 8. Acute on chronic CHF: On Coreg , lipitor, imdur and Lasix.  Monitor for signs of overload--monitor weights daily.  Filed Weights   06/26/19 0326 06/27/19 0306 06/28/19 0415  Weight: 68.8 kg 65.6 kg 66.6 kg   Relatively stable on 3/31 9. CAD/CAF/ s/p AICD: Continues imdur, Coreg and lipitor. Monitor HR tid.  Vitals:   06/27/19 1929 06/28/19 0415  BP: (!) 90/53 138/86  Pulse: 82 77  Resp: 16 18  Temp: 97.8 F (36.6 C) 98.4 F (36.9 C)  SpO2: 97% 99%   Controlled on 3/31 10. Hypokalemia:  Resolved  11. ABLA: Resolved 12. Macular degeneration: Legally blind.  Needs magnifying glass to read normal print  13. Sleep disturbance: Trazodone '25mg'$  HS PRN 14.  Post stroke dysphagia-D3 thins liq - advance per SLP- still requires supervision  15. Heat rash: Has benadryl prn; used at 2am last night.  Continue Sarna lotion and Triamcinolone ointment BID.  16. Aphasia- mainly expressive mild, SLP  17.  Acute lower UTI- still a little burning, pt with multiple species on culture, afebrile    Empiric Macrobid started on 3/27 switched to Keflex because of diarrhea  18.  Hyponatremia- LAsix, ?SIADH , check serum and urine osmolality today  LOS: 14 days A FACE TO FACE EVALUATION WAS PERFORMED  Charlett Blake 06/28/2019, 7:54 AM

## 2019-06-28 NOTE — Progress Notes (Signed)
Transitions of Care Pharmacist Note  Mark Clayton is a 79 y.o. male that has been diagnosed with A Fib and will be prescribed Xarelto (rivaroxaban) at discharge. Please note that he had a stent placed in 10/2018 and was supposed to start Xarelto and Plavix daily for 6-12 months. However, per the patient, he was not actively taking Xarelto PTA and is unsure why. He will need a new prescription and a benefit check as he states his co-pays have been high for Xarelto in the past.   Patient Education: I provided the following education on Xarelto to the patient: How to take the medication Described what the medication is Signs of bleeding Answered their questions  Discharge Medications Plan: The patient wants to have their discharge medications filled by the Transitions of Care pharmacy rather than their usual pharmacy when he leaves CIR.  The discharge orders pharmacy has been changed to the Transitions of Care pharmacy, the patient will receive a phone call regarding co-pay, and their medications will be delivered by the Transitions of Care pharmacy.    Thank you,   Eddie Candle, PharmD PGY-1 Pharmacy Resident   Please check amion for clinical pharmacist contact number   June 28, 2019

## 2019-06-28 NOTE — Progress Notes (Signed)
Speech Language Pathology Daily Session Note  Patient Details  Name: Mark Clayton MRN: [id number] Date of Birth: [birth year]  Today's Date: 06/28/2019 SLP Individual Time: 1300-1355 SLP Individual Time Calculation (min): 55 min  Short Term Goals: Week 2: SLP Short Term Goal 1 (Week 2): Pt will consume current diet with minimal s/sx aspiration and efficient mastication and oral clearance with use of recommended swallow stategies with Supervision A verbal/visual cues. SLP Short Term Goal 2 (Week 2): Pt will detect and correct semantic and/or phonemic errors with Supervision A multimodal cues. SLP Short Term Goal 3 (Week 2): Pt will detect and correct semantic and/or phonemic errors with Supervisoin A multimodal cues. SLP Short Term Goal 4 (Week 2): Pt will use increased vocal intensity to achieve 90% intellgibility at the converstaion level with Supervision A cues. SLP Short Term Goal 5 (Week 2): Pt will susatin attention to tasks with Supervision A verbal cues for redirection. SLP Short Term Goal 6 (Week 2): Pt will anticipate d/c need including ADLs which he will require assistance with and/or equipment he will need with Min A cues.  Skilled Therapeutic Interventions: Pt was seen for skilled ST targeting dysphagia and cognitive goals. SLP facilitated session with skilled observation of pt consuming current diet Dysphagia 3 (mech soft) solids and thin liquids, during which pt was Mod I for use of swallow strategies. Pt's RN also reported pt has been appropriately requesting assistance with repositioning to sit in "chair position" prior to intake, indicative of good carryover of swallow precautions for safe PO intake that has been previously discussed with SLP. Recommend continue current diet. During a basic money scenario task, pt made basic calculations with Supervision A verbal cues for verbal problem solving and Min A verbal cues for recall within task for 4/5 scenarios. However, when complexity  increased very slightly, pt became very frustrated and self limiting, requiring Max A for rationalizing why we should attempt to work through this problem together. Ultimately pt did complete accurate calculation with step by step Max A cues for verbal problem solving and recall of a multi-step calculation. Pt required Min A verbal cues for redirection to tasks throughout session. Pt left sitting in bed with alarm set and needs within reach. Continue per current plan of care.     Pain Pain Assessment Pain Scale: 0-10 Pain Score: 0-No pain  Therapy/Group: Individual Therapy  Arbutus Leas 06/28/2019, 7:24 AM

## 2019-06-28 NOTE — Progress Notes (Signed)
ANTICOAGULATION CONSULT NOTE - Initial Consult  Pharmacy Consult for Xarelto Indication: atrial fibrillation  Allergies  Allergen Reactions  . Latex Rash  . Tape Rash and Other (See Comments)    USE PAPER    Patient Measurements: Height: 6' (182.9 cm) Weight: 146 lb 13.2 oz (66.6 kg) IBW/kg (Calculated) : 77.6  Vital Signs: Temp: 98.4 F (36.9 C) (03/31 0415) BP: 138/86 (03/31 0415) Pulse Rate: 77 (03/31 0415)  Labs: Recent Labs    06/26/19 0719  HGB 12.3*  HCT 37.4*  PLT 204  CREATININE 1.00    Estimated Creatinine Clearance: 57.4 mL/min (by C-G formula based on SCr of 1 mg/dL).   Medical History: Past Medical History:  Diagnosis Date  . AICD (automatic cardioverter/defibrillator) present 01/17/2003   Medtronic Maximo 7232CX ICD, serial [id number]  . Anemia 02-06-11   takes oral iron  . Arthritis    hands, knees  . CAD (coronary artery disease) 2003   a. h/o MI and CABG in 2003. b. s/p DES to SVG-RPDA-RPLB in 08/2014.  Marland Kitchen Cancer of sigmoid colon ([place]) 2012   a. s/p colon surgery.  . Carotid bruit   . Chronic systolic CHF (congestive heart failure) (HCC)    a. EF 20% in 2014; b. 08/2017 Echo: EF 20-25%, diff HK, Gr3 DD. Triv AI. Mod MR. Sev dil LA. Mildly dil RV w/ mildly reduced RV fxn. Mildly dil RA. Mod TR. PASP 78mHg.  Marland Kitchen Cough   . GERD (gastroesophageal reflux disease) 02-06-11  . HTN (hypertension)   . Hyperlipidemia   . Ischemic cardiomyopathy    a. EF 20% in 2014. (Master study EF >20%); b. 08/2017 Echo: EF 20-25%, diff HK. Gr3 DD.  . LV (left ventricular) mural thrombus    a. 12/2012 Echo: EF 20% with mural thrombus No evidence of thrombus on 08/2017 echo.  . Macular degeneration   . Myocardial infarct ([place])    2003  . PAF (paroxysmal atrial fibrillation) (HCC)    a. CHA2DS2VASc = 5-->Xarelto/Tikosyn.    Medications:  Scheduled:  . atorvastatin  80 mg Oral q1800  . camphor-menthol   Topical BID AC  . carvedilol  12.5 mg Oral BID WC  .  cephALEXin  250 mg Oral Q6H  . chlorhexidine  15 mL Mouth Rinse BID  . clopidogrel  75 mg Oral Daily  . ezetimibe  10 mg Oral Daily  . furosemide  40 mg Oral Daily  . isosorbide mononitrate  30 mg Oral Daily  . pantoprazole  40 mg Oral Daily  . polyethylene glycol  17 g Oral Daily  . potassium chloride  10 mEq Oral BID  . triamcinolone cream   Topical BID  . vitamin B-12  100 mcg Oral Daily    Assessment: 79 yo M on Xarelto PTA for afib.  He initially presented to the ED 3/9 following a fall where he hit his head and was found to have SDH.  Anticoagulation was reversed with KCentra.  Pt has been off full dose anticoagulation since that time (Heparin SQ for VTE ppx was started 3/17).  Pt was transferred to inpatient rehab on 3/17.  Rehab and Neuro physicians agree, pt is stable to resume full dose anticoagulation for afib.  Pharmacy has been asked to resume Xarelto dosing.  Goal of Therapy:  Therapeutic anticoagulation Monitor platelets by anticoagulation protocol: Yes   Plan:  Start Xarelto 20mg  daily with evening meal. Follow-up for s/sx bleeding. Next CBC ordered for Monday 4/5.  [employer], Pharm.D., BCPS  Clinical Pharmacist Clinical phone for 06/28/2019 from 7:30-3:00 is [phone].  **Pharmacist phone directory can be found on [email or web address] listed under [place].  06/28/2019 8:48 AM

## 2019-06-28 NOTE — Progress Notes (Signed)
Occupational Therapy Session Note  Patient Details  Name: Mark Clayton MRN: [id number] Date of Birth: 17-May-1940  Today's Date: 06/28/2019 OT Individual Time: [id number] and 1130-1200 OT Individual Time Calculation (min): 53 min and 30 mins   Short Term Goals: Week 2:  OT Short Term Goal 1 (Week 2): Pt will perform LB dressing with mod A overall. OT Short Term Goal 2 (Week 2): Pt will perform UB dressing with mod A overall. OT Short Term Goal 3 (Week 2): Pt will perform toilet transfer with mod A overall. OT Short Term Goal 4 (Week 2): Pt will complete bed mobility with MIN A overall.  Skilled Therapeutic Interventions/Progress Updates:    Session 1: Upon entering the room, pt greeted therapist with verbalizing urgent need for BM. Supine >sit with CGA and use of stedy to transport pt to commode. Pt standing into stedy with min A. Min A standing balance to push clothing items down. Pt able to have BM this session and needed assistance for hygiene. Pt does report sensory deficits in [name] and floor temperature feeling different on B [name]. OT discussed safety concerns related to this. Pt returned to EOB and applied lotion to legs with R UE. Pt needing hand over hand assistance to place in correct locations on back of leg secondary to unable to visually determine and decreased sensation to know where he is applying. Pt returning to bed at end of session with supervision sit >supine. Call bell and all needed items within reach upon exiting the room.   Session 2: Upon entering the room, pt sleeping soundly in bed and needing multiple attempts to arouse for participation. OT discussing pt's progress towards goals with discussion of team's plan to keep discharge for 4/10. Pt very tearful and discussing things he wants to do but is unable to at this time. Pt verbalized wanting to be able to read and bake at home. OT suggested pt try audiobooks at discharge and bake with family present to assist as needed. Pt  verbalize, " You are right. I'm going to do that." OT assisted pt with repositioning for comfort and all needs within reach.   Therapy Documentation Precautions:  Precautions Precautions: Fall Precaution Comments: R hemiparesis Restrictions Weight Bearing Restrictions: No Vital Signs: Therapy Vitals Pulse Rate: 68 BP: 99/65 Patient Position (if appropriate): Lying   Therapy/Group: Individual Therapy  Gypsy Decant 06/28/2019, 12:39 PM

## 2019-06-28 NOTE — Plan of Care (Signed)
  Problem: RH BOWEL ELIMINATION Goal: RH STG MANAGE BOWEL WITH ASSISTANCE Description: STG Manage Bowel with Assistance. Outcome: Progressing   Problem: RH BLADDER ELIMINATION Goal: RH STG MANAGE BLADDER WITH ASSISTANCE Description: STG Manage Bladder With Assistance Outcome: Progressing   Problem: RH SKIN INTEGRITY Goal: RH STG SKIN FREE OF INFECTION/BREAKDOWN Outcome: Progressing   Problem: RH SAFETY Goal: RH STG ADHERE TO SAFETY PRECAUTIONS W/ASSISTANCE/DEVICE Description: STG Adhere to Safety Precautions With Assistance/Device. Outcome: Progressing

## 2019-06-28 NOTE — Patient Care Conference (Signed)
Inpatient RehabilitationTeam Conference and Plan of Care Update Date: 06/28/2019   Time: 10:15 AM    Patient Name: Mark Clayton      Medical Record Number: [id number]  Date of Birth: 18-Sep-1940 Sex: Male         Room/Bed: 4M08C/4M08C-02 Payor Info: Payor: [employer] MEDICARE / Plan: [facility] MEDICARE / Product Type: *No Product type* /    Admit Date/Time:  06/14/2019  5:17 PM  Primary Diagnosis:  Embolic stroke [facility])  Patient Active Problem List   Diagnosis Date Noted  . Acute lower UTI   . Dysphagia, post-stroke   . Hypokalemia   . Acute on chronic combined systolic and diastolic congestive heart failure ([place])   . Embolic stroke ([place]) 10/08/1973  . ICH (intracerebral hemorrhage) ([place]) 06/06/2019  . Dysarthria   . Tinnitus 08/26/2018  . Essential hypertension 01/21/2018  . Coronary artery disease involving native heart with angina pectoris ([place]) 10/19/2017  . Persistent atrial fibrillation   . Routine general medical examination at a health care facility 01/10/2016  . Unstable angina ([place]) 09/20/2014  . Ischemic cardiomyopathy   . Personal history of colon cancer   . Implantable cardioverter-defibrillator (ICD) in situ 03/09/2012  . Microcytic anemia 01/12/2011  . Cardiac defibrillator  MDT VVI 12/26/2010  . Chronic systolic heart failure ([place]) 11/26/2008    Expected Discharge Date: Expected Discharge Date: 07/08/19  Team Members Present: Physician leading conference: Dr. Alysia Penna Care Coodinator Present: Nestor Lewandowsky, RN, BSN, CRRN;Genie Herman Mell, RN, MSN Nurse Present: Other (comment)(Marie Poynter, RN) PT Present: Barrie Folk, PT OT Present: Darleen Crocker, OT SLP Present: Jettie Booze, CF-SLP     Current Status/Progress Goal Weekly Team Focus  Bowel/Bladder   patient is incontinent of bladder and continent of BM, LBM: 06/25/19  obtain bladder continence and maintain regular BM  q2 hours toileting and when needed   Swallow/Nutrition/ Hydration   Dys 3,  thin liquids, intermittent supervision now  Supervision A least restrictive diet  tolerance Dys 3/thin, regular trials, carryover swallow strategies   ADL's   MAX A for UB/LB bathing; MOD- MAX A for UB/LB dressing, Gordon for stand pivot transfers. visual deficits continue to be limiting  Min A overall  NMR, transfer training, balance, ADL retraining, and activity tolerance   Mobility   min to modA bed mobility, modA stand pivot, minA STS, modA gait at wall rail continues to significant propioceptive and vision deficits, supervision w/c mobilty  minA overall  standing balance, transfers, gait   Communication   Mod I  Mod I  goals met   Safety/Cognition/ Behavioral Observations  Supervision-Min A sustained attention, difficulty with mental flexibility which impacts his problem solving at times, ~Min A porblem solving familiar tasks  Supervision A  basic problem solving, anticipatory awareness, recall   Pain   patient c/o pain in R leg  pain score <3  q shift and PRN pain assessment   Skin   patient skin is dry and itching  free from itching  q shift/ PRN skin assessment    Rehab Goals Patient on target to meet rehab goals: Yes *See Care Plan and progress notes for long and short-term goals.     Barriers to Discharge  Current Status/Progress Possible Resolutions Date Resolved   Nursing                  PT  OT                  SLP                SW Decreased caregiver support   Main level living with access to B+B, no steps to entry and family around to assist          Discharge Planning/Teaching Needs:  Home with daughter and grand-daughter  TBD-Medications, transfers, toileting, etc.   Team Discussion: MD repeated CT scan, can restart anticoagulant therapy, labile BP/monitoring, UTI, on abx.  RN inc B/B, diarrhea 3/29.  OT S/min A goals, max UB/LB bathing, mod/max dressing, mod/max transfers, visual deficits, fatigues quickly.  PT min/mod bed, min sit to  stand, mod A pivot transfer, mod 27', S w/c mobility, min A goals.  SLP D3thins, int S, drinks ok at bedside, mod I for speech, working on prob solving min A, goals S.  Dtr/Gdtr to assist at DC.   Revisions to Treatment Plan: N/A     Medical Summary Current Status: BP labile, aphasic, incont bowel adn bladder , UTI on Kefle, fatigue, macular deneration Weekly Focus/Goal: work on swallowing , met  speech goals but working on cognition  Barriers to Discharge: Medical stability;Incontinence   Possible Resolutions to Barriers: Neuropsych for anxiety   Continued Need for Acute Rehabilitation Level of Care: The patient requires daily medical management by a physician with specialized training in physical medicine and rehabilitation for the following reasons: Direction of a multidisciplinary physical rehabilitation program to maximize functional independence : Yes Medical management of patient stability for increased activity during participation in an intensive rehabilitation regime.: Yes Analysis of laboratory values and/or radiology reports with any subsequent need for medication adjustment and/or medical intervention. : Yes   I attest that I was present, lead the team conference, and concur with the assessment and plan of the team.   Retta Diones 06/28/2019, 3:27 PM   Team conference was held via web/ teleconference due to [place] - 19

## 2019-06-29 ENCOUNTER — Inpatient Hospital Stay (HOSPITAL_COMMUNITY): Payer: Medicare Other | Admitting: Speech Pathology

## 2019-06-29 ENCOUNTER — Ambulatory Visit (HOSPITAL_COMMUNITY): Payer: Medicare Other | Admitting: *Deleted

## 2019-06-29 ENCOUNTER — Inpatient Hospital Stay (HOSPITAL_COMMUNITY): Payer: Medicare Other

## 2019-06-29 NOTE — Progress Notes (Signed)
Speech Language Pathology Weekly Progress and Session Note  Patient Details  Name: Mark [facility] MRN: [id number] Date of Birth: 09/22/1940  Beginning of progress report period: June 22, 2019 End of progress report period: June 29, 2019  Today's Date: 06/29/2019 SLP Individual Time: 0730-0830 SLP Individual Time Calculation (min): 60 min  Short Term Goals: Week 2: SLP Short Term Goal 1 (Week 2): Pt will consume current diet with minimal s/sx aspiration and efficient mastication and oral clearance with use of recommended swallow stategies with Supervision A verbal/visual cues. SLP Short Term Goal 2 (Week 2): Pt will detect and correct semantic and/or phonemic errors with Supervision A multimodal cues. SLP Short Term Goal 2 - Progress (Week 2): Met SLP Short Term Goal 3 (Week 2): Pt will detect and correct semantic and/or phonemic errors with Supervisoin A multimodal cues. SLP Short Term Goal 3 - Progress (Week 2): Met SLP Short Term Goal 4 (Week 2): Pt will use increased vocal intensity to achieve 90% intellgibility at the converstaion level with Supervision A cues. SLP Short Term Goal 4 - Progress (Week 2): Met SLP Short Term Goal 5 (Week 2): Pt will susatin attention to tasks with Supervision A verbal cues for redirection. SLP Short Term Goal 5 - Progress (Week 2): Progressing toward goal SLP Short Term Goal 6 (Week 2): Pt will anticipate d/c need including ADLs which he will require assistance with and/or equipment he will need with Min A cues. SLP Short Term Goal 6 - Progress (Week 2): Progressing toward goal    New Short Term Goals: Week 3: SLP Short Term Goal 1 (Week 3): STG=LTG due to remaining LOS  Weekly Progress Updates: Pt has made functional gains and met 4 out of 6 short term goals. Pt is currently ~Min-Mod assist for use of strategies for short term recall, basic problem solving, anticipatory awareness, and sustained attention to tasks due to mild-mod cognitive impairments  and reduced mental flexibility that impacts his problem solving. Pt is consuming Dys 3 (mech soft) diet with thin liquids. Pt has demonstrated improved sustained attention and basic problem solving, as well as awareness and correction or verbal errors (phonemic paraphasias and word finding in conversation). Pt education is ongoing; no family has been present for ST session this wek. Pt would continue to benefit from skilled ST while inpatient in order to maximize functional independence and reduce burden of care prior to discharge. Anticipate that pt will need 24/7 supervision at discharge in addition to [place] follow up at next level of care.      Intensity: Minumum of 1-2 x/day, 30 to 90 minutes Frequency: 3 to 5 out of 7 days Duration/Length of Stay: 4/10 Treatment/Interventions: Cognitive remediation/compensation;Cueing hierarchy;Dysphagia/aspiration precaution training;Functional tasks;Internal/external aids;Speech/Language facilitation;Therapeutic Activities;Patient/family education   Daily Session  Skilled Therapeutic Interventions: Pt was seen for skilled ST targeting dysphagia and cognitive goals. Pt consumed Dys 3 breakfast cereal and thins with throat clear X1 after bite of cerel with milk. Discussed using extra caution with dual consistencies such as cereal with milk, fruit cocktail, etc. Recommend continue current diet and intermittent supervision. Cognitive interventions focused on anticipatory awareness, mental flexibility, and functional recall. Pt with only general recall of familiar medication functions - he stated "I take medicines for my heart". Overall Mod A including names of medication require for pt to recall functions of current medications. Pt's granddaughter assists with putting together pt's pill box each week, however he states he is the one responsible for remembering when it is  time to take his medications at home. Mod A verbal cues and extensive rationale required for pt to  anticipate how short term memory deficits may impact his ability to take medications on time upon d/c and need for assistance with this task at home. Pt left laying in bed with alarm set and needs within reach. Continue per current plan of care.         Pain Pain Assessment Pain Scale: 0-10 Pain Score: 0-No pain  Therapy/Group: Individual Therapy  Arbutus Leas 06/29/2019, 7:11 AM

## 2019-06-29 NOTE — Progress Notes (Addendum)
[facility] PHYSICAL MEDICINE & REHABILITATION PROGRESS NOTE   Subjective/Complaints:   No issues working with SLP, good day with PT yesterday   ROS: Denies CP, SOB, N/V/D  Objective:   No results found. No results for input(s): WBC, HGB, HCT, PLT in the last 72 hours. No results for input(s): NA, K, CL, CO2, GLUCOSE, BUN, CREATININE, CALCIUM in the last 72 hours.  Intake/Output Summary (Last 24 hours) at 06/29/2019 0742 Last data filed at 06/28/2019 2153 Gross per 24 hour  Intake 120 ml  Output 325 ml  Net -205 ml     Physical Exam: Vital Signs Blood pressure 118/79, pulse 66, temperature 97.6 F (36.4 C), temperature source Oral, resp. rate 17, height 6' (1.829 m), weight 66.6 kg, SpO2 96 %.    General: No acute distress Mood and affect are appropriate Heart: Regular rate and rhythm no rubs murmurs or extra sounds Lungs: Clear to auscultation, breathing unlabored, no rales or wheezes Abdomen: Positive bowel sounds, soft nontender to palpation, nondistended Extremities: No clubbing, cyanosis, or edema Skin: No evidence of breakdown, no evidence of rash   HOH Motor: RUE: Shoulder adduction 3 -/5, elbow flexion/extension 3+/5, handgrip 2/5  LUE: 4+/5 proximal distal LLE: 4+/5 proximal distal RLE: 2 -/5 proximal to distal  Assessment/Plan: 1. Functional deficits secondary to CVA L MCA and subdural  which require 3+ hours per day of interdisciplinary therapy in a comprehensive inpatient rehab setting.  Physiatrist is providing close team supervision and 24 hour management of active medical problems listed below.  Physiatrist and rehab team continue to assess barriers to discharge/monitor patient progress toward functional and medical goals  Care Tool:  Bathing  Bathing activity did not occur: Refused Body parts bathed by patient: Face, Right arm, Chest, Abdomen, Front perineal area, Right upper leg, Left upper leg   Body parts bathed by helper: Left arm, Buttocks,  Right lower leg, Left lower leg Body parts n/a: Front perineal area, Buttocks, Right lower leg, Left lower leg   Bathing assist Assist Level: Moderate Assistance - Patient 50 - 74%     Upper Body Dressing/Undressing Upper body dressing   What is the patient wearing?: Pull over shirt    Upper body assist Assist Level: Minimal Assistance - Patient > 75%    Lower Body Dressing/Undressing Lower body dressing      What is the patient wearing?: Incontinence brief     Lower body assist Assist for lower body dressing: Moderate Assistance - Patient 50 - 74%     Toileting Toileting    Toileting assist Assist for toileting: Maximal Assistance - Patient 25 - 49%     Transfers Chair/bed transfer  Transfers assist     Chair/bed transfer assist level: Moderate Assistance - Patient 50 - 74%     Locomotion Ambulation   Ambulation assist   Ambulation activity did not occur: Safety/medical concerns  Assist level: Moderate Assistance - Patient 50 - 74% Assistive device: Walker-rolling Max distance: 50   Walk 10 feet activity   Assist  Walk 10 feet activity did not occur: Safety/medical concerns  Assist level: Moderate Assistance - Patient - 50 - 74% Assistive device: Walker-rolling   Walk 50 feet activity   Assist Walk 50 feet with 2 turns activity did not occur: Safety/medical concerns  Assist level: Moderate Assistance - Patient - 50 - 74% Assistive device: Walker-rolling    Walk 150 feet activity   Assist Walk 150 feet activity did not occur: Safety/medical concerns  Walk 10 feet on uneven surface  activity   Assist Walk 10 feet on uneven surfaces activity did not occur: Safety/medical concerns         Wheelchair     Assist Will patient use wheelchair at discharge?: Yes(LT goals set)      Wheelchair assist level: Supervision/Verbal cueing Max wheelchair distance: 50    Wheelchair 50 feet with 2 turns activity    Assist         Assist Level: Supervision/Verbal cueing   Wheelchair 150 feet activity     Assist      Assist Level: Maximal Assistance - Patient 25 - 49%   Blood pressure 118/79, pulse 66, temperature 97.6 F (36.4 C), temperature source Oral, resp. rate 17, height 6' (1.829 m), weight 66.6 kg, SpO2 96 %.  Medical Problem List and Plan: 1.  Left parafalcine subdural hematoma and subarachnoid hemorrhage, Left MCA M3 branch embolic stroke   Continue CIR PT, OT. SLP 2.  Antithrombotics: -DVT/anticoagulation:DC Heparin   Mechanical: Sequential compression devices, below knee Bilateral lower extremities per Neuro Dr Leonie Man, ok to resume Xarelto, will consult pharmacy             -antiplatelet therapy: resuming Plavix  3. Pain Management: N/a 4. Mood: LCSW to follow for evaluation and support.              -antipsychotic agents: N/A 5. Neuropsych: This patient is capable of making decisions on his own behalf. 6. Skin/Wound Care: Routine pressure relief measures.  7. Fluids/Electrolytes/Nutrition: Monitor I/Os. Continue Vitamin B12 supplement 8. Acute on chronic CHF: On Coreg , lipitor, imdur and Lasix.  Monitor for signs of overload--monitor weights daily.  Filed Weights   06/26/19 0326 06/27/19 0306 06/28/19 0415  Weight: 68.8 kg 65.6 kg 66.6 kg   Relatively stable on 4/1 9. CAD/CAF/ s/p AICD: Continues imdur, Coreg and lipitor. Monitor HR tid.  Vitals:   06/28/19 2030 06/29/19 0400  BP: (!) 90/53 118/79  Pulse: 74 66  Resp: 19 17  Temp: 97.7 F (36.5 C) 97.6 F (36.4 C)  SpO2: 97% 96%   Controlled on 4/1 10. Hypokalemia:   Resolved  11. ABLA: Resolved 12. Macular degeneration: Legally blind.  Needs magnifying glass to read normal print  13. Sleep disturbance: Trazodone 25mg  HS PRN 14.  Post stroke dysphagia-D3 thins liq - advance per SLP- still requires supervision  15. Heat rash: Has benadryl prn; used at 2am last night.  Continue Sarna lotion and Triamcinolone ointment BID.   16. Aphasia- mainly expressive mild, SLP  17.  Acute lower UTI- still a little burning, pt with multiple species on culture, afebrile    Empiric Macrobid started on 3/27 switched to Keflex because of diarrhea  18.  Hyponatremia- LAsix, ?SIADH , check serum and urine osmolality today - normal serum osmo with dliute urine, nore likely furosemid e effect rather than SIADH  LOS: 15 days A FACE TO FACE EVALUATION WAS PERFORMED  Charlett Blake 06/29/2019, 7:42 AM

## 2019-06-29 NOTE — Progress Notes (Signed)
Recreational Therapy Session Note  Patient Details  Name: [facility] MRN: [id number] Date of Birth: 13-Jan-1941 Today's Date: 06/29/2019 Time:  1305-1400 Pain: no c/o Skilled Therapeutic Interventions/Progress Updates:  Session focused on activity tolerance, ambulation, dynamic standing balance during co-treat with PT.  Pt ambulated with RW 35' and 57' with assistance from PT and w/c follow.  Pt stood with 1UE support on RW for corn hole game reaching with LUE outside BOS to the left to promote weight shifting/weightbearing through LLE.  Pt required instructional cues as well as cues for encouragement.  Pt is hard on himself and has difficulty recognizing progress.  Therapy/Group: Co-Treatment  Tashanti Dalporto 06/29/2019, 3:10 PM

## 2019-06-29 NOTE — Progress Notes (Signed)
Occupational Therapy Session Note  Patient Details  Name: [facility] MRN: [id number] Date of Birth: 07/21/1940  Today's Date: 06/29/2019 OT Individual Time: 1000-1100 OT Individual Time Calculation (min): 60 min    Short Term Goals: Week 2:  OT Short Term Goal 1 (Week 2): Pt will perform LB dressing with mod A overall. OT Short Term Goal 2 (Week 2): Pt will perform UB dressing with mod A overall. OT Short Term Goal 3 (Week 2): Pt will perform toilet transfer with mod A overall. OT Short Term Goal 4 (Week 2): Pt will complete bed mobility with MIN A overall.  Skilled Therapeutic Interventions/Progress Updates:  Pt received supine in bed agreeable to OT intervention. Pt declined washing up this AM. Pt completed bed mobility with CGA this session with pt able to carryover compensatory method for advancing BLEs to EOB. Pt required MOD A for LB dressing to don pants EOB; MIN A for sit<>stand to pull pants up to waist line. MAX A to don shoes EOB. Pt completed stand pivot transfer to w/c to pts L side with MOD A. Pt transported to therapy gym for time mgmt. Session focus on increasing standing tolerance for ADL participation. Pt able to stand ~ 8 mins to play checkers with CGA. Pt initially reports difficulty locating pieces however pt able to compensate by adding contrast to board. Noted depth perception issues however pt able to compensate with hand over hand assist from OTA. Pt appeared to enjoy activity as pt reports "I haven't done this in years" and reports no pain while standing, however pt did report fatigue in eyes needing to terminate activity and sit down. Pt transported back to room with total A for time mgmt where pt requested to return to bed. MOD A for stand pivot transfer from w/c >EOB to pts L side. MODA to elevate BLEs back to bed. Pt left supine in bed with all needs within reach and bed alarm activated.   Therapy Documentation Precautions:  Precautions Precautions:  Fall Precaution Comments: R hemiparesis Restrictions Weight Bearing Restrictions: No General:   Vital Signs: Therapy Vitals Pulse Rate: 81 BP: 124/68 Patient Position (if appropriate): Lying Pain: Pt reports no pain during session.   Therapy/Group: Individual Therapy  [name] 06/29/2019, 11:45 AM

## 2019-06-29 NOTE — Progress Notes (Signed)
Resting and cooperative verbalized discomfort pain to LE's and medicated with Tylenol 650 mg po, assisted with repositioning in bed, extra blanket applied per request, Call bell placed within reach and monitor, Sleep chart in place per orders.

## 2019-06-29 NOTE — Progress Notes (Signed)
Team Conference Report to Patient/Family  Team Conference discussion was reviewed with the patient and daughter and sister, including goals, any changes in plan of care and target discharge date.  Patient and caregiver express understanding and are in agreement.  The patient has a target discharge date of 07/08/19.  Family educ set up for 07/06/19 @1pm   Margarito Liner 06/29/2019, 9:45 AM

## 2019-06-29 NOTE — Plan of Care (Signed)
  Problem: Consults Goal: RH STROKE PATIENT EDUCATION Description: See Patient Education module for education specifics  Outcome: Progressing   Problem: RH BLADDER ELIMINATION Goal: RH STG MANAGE BLADDER WITH ASSISTANCE Description: STG Manage Bladder With Assistance Outcome: Progressing   Problem: RH SKIN INTEGRITY Goal: RH STG SKIN FREE OF INFECTION/BREAKDOWN Outcome: Progressing   Problem: RH SAFETY Goal: RH STG ADHERE TO SAFETY PRECAUTIONS W/ASSISTANCE/DEVICE Description: STG Adhere to Safety Precautions With Assistance/Device. Outcome: Progressing Goal: RH STG DECREASED RISK OF FALL WITH ASSISTANCE Description: STG Decreased Risk of Fall With Assistance. Outcome: Progressing

## 2019-06-29 NOTE — Progress Notes (Signed)
Physical Therapy Session Note  Patient Details  Name: [facility] MRN: [id number] Date of Birth: October 09, 1940  Today's Date: 06/29/2019 PT Individual Time: 1300-1400 PT Individual Time Calculation (min): 60 min   Short Term Goals: Week 2:  PT Short Term Goal 1 (Week 2): PT will transfer to [facility] with min assist consistently PT Short Term Goal 2 (Week 2): Pt will ambulate with mod assist x 73f and LRAD PT Short Term Goal 3 (Week 2): Pt will propell WC 1094fwith supervision assist PT Short Term Goal 4 (Week 2): Pt will perform bed mobility with min assist consistently  Skilled Therapeutic Interventions/Progress Updates:   Pt received supine in bed and agreeable to PT. Supine>sit transfer with supevisoin assist from semi reclined position with hemi technique using sound LE to control paretic.   Stand pivot transfer to the R with mod assist for weight shift L to allow RLE to advance.   WC mobility x 12023fith supervision assist and cues for awareness of obstacles on the R.   Gait training with RW, DF/knee/hip flexion wrap with tband [id number] assist overall to improved weight shift L and initiation pelvic rotation on the R to facilitate hip/knee fleixon with limb advancement. Pt able to activate 10% of the time. Decreased GR noted today compared to previous session  Dynamic standing balance to perform L lateral reach and force weight shfit L. 3 x 8 with min assist to force WB through the RLE and allow improved L weight shift.  Pt returned to room and performed stand pivot transfer to bed with RW and min assist to the L. Sit>supine completed with min assist at RLW and left supine in bed with call bell in reach and all needs met.       Therapy Documentation Precautions:  Precautions Precautions: Fall Precaution Comments: R hemiparesis Restrictions Weight Bearing Restrictions: No    Vital Signs: Therapy Vitals Temp: 97.9 F (36.6 C) Temp Source: Oral Pulse Rate:  71 Resp: 18 BP: 104/74 Patient Position (if appropriate): Lying Oxygen Therapy SpO2: 98 % O2 Device: Room Air Pain: denies   Therapy/Group: Individual Therapy  AustinLorie Phenix021, 5:00 PM

## 2019-06-30 ENCOUNTER — Inpatient Hospital Stay (HOSPITAL_COMMUNITY): Payer: Medicare Other | Admitting: Occupational Therapy

## 2019-06-30 ENCOUNTER — Inpatient Hospital Stay (HOSPITAL_COMMUNITY): Payer: Medicare Other | Admitting: Speech Pathology

## 2019-06-30 ENCOUNTER — Inpatient Hospital Stay (HOSPITAL_COMMUNITY): Payer: Medicare Other | Admitting: Physical Therapy

## 2019-06-30 MED ORDER — MECLIZINE HCL 25 MG PO TABS
12.5000 mg | ORAL_TABLET | Freq: Two times a day (BID) | ORAL | Status: DC | PRN
  Administered 2019-06-30: 12.5 mg via ORAL
  Filled 2019-06-30: qty 1

## 2019-06-30 NOTE — Progress Notes (Signed)
Occupational Therapy Weekly Progress Note  Patient Details  Name: Mark [facility] MRN: [id number] Date of Birth: Jan 25, 1941  Beginning of progress report period: June 23, 2019 End of progress report period: June 30, 2019     Patient has met 3 of 4 short term goals.Pt has made good progress this reporting period. Pt continues to present with decreased activity tolerance,endurance, and standing balance limiting ability to engage in ADLs. Overall,  Pt requires set- up assist for seated grooming tasks, MOD A for bathing, MIN A for UB dressing and MOD- MAX A for LB dressing. MAX A for clothing mgmt and hygiene, MIN A sit<>stand with RW and MOD A for transfers. Pt continues to present with decreased RUE ROM and often requires verbal cues to incorporate RUE into functional tasks. Pts visual impairments continue to affect pts functional independence needing assist to incorporate compensatory strategies into ADLs.  Pts family has been present during session and appears supportive and encouraging. Will continue to progress pt towards OT goals to increase functional independence.   Patient continues to demonstrate the following deficits: muscle weakness, decreased cardiorespiratoy endurance, abnormal tone, unbalanced muscle activation and decreased coordination, blindness, decreased midline orientation, decreased awareness, decreased problem solving and decreased safety awareness and decreased sitting balance, decreased standing balance, decreased postural control and hemiplegia and therefore will continue to benefit from skilled OT intervention to enhance overall performance with BADL.  Patient progressing toward long term goals..  Continue plan of care.  OT Short Term Goals Week 2:  OT Short Term Goal 1 (Week 2): Pt will perform LB dressing with mod A overall. OT Short Term Goal 1 - Progress (Week 2): Progressing toward goal OT Short Term Goal 2 (Week 2): Pt will perform UB dressing with mod A overall. OT  Short Term Goal 2 - Progress (Week 2): Met OT Short Term Goal 3 (Week 2): Pt will perform toilet transfer with mod A overall. OT Short Term Goal 3 - Progress (Week 2): Met OT Short Term Goal 4 (Week 2): Pt will complete bed mobility with MIN A overall. OT Short Term Goal 4 - Progress (Week 2): Met Week 3:  OT Short Term Goal 1 (Week 3): STG= LTG secondary to ELOS   Mark [facility] 06/30/2019, 12:54 PM

## 2019-06-30 NOTE — Progress Notes (Signed)
Patient's last documented BM is 06/26/19. Patient refused scheduled Miralax for AM administration. Patient requested miralax at 13:02 PM. Offered PRN fleet enema, patient declined administration at this time. Amanda Cockayne, LPN

## 2019-06-30 NOTE — Progress Notes (Signed)
Occupational Therapy Session Note  Patient Details  Name: Mark [facility] MRN: [id number] Date of Birth: [birth year]  Today's Date: 06/30/2019 OT Individual Time: [id number] OT Individual Time Calculation (min): 86 min   Skilled Therapeutic Interventions/Progress Updates:    Pt greeted in bed, c/o cramping in both legs but not wanting medicine to address. Requesting to participate in bathing during session, declining shower, opting instead to complete B/D tasks sit<stand at the sink. Started with donning gripper socks bedlevel with pt stating in advance "I can't do that." With encouragement, he was reluctantly willing to try himself. With one bout of increased Lt LE cramping pt refused to attempt any more. CGA for supine<sit. While EOB, pt attempted to don Lt gripper sock. Once again, he could not due to cramping. Min A for sit<stand with RW. When asked to advance Rt LE for stand pivot<w/c pt reported he could not, appeared anxious when given cuing. He returned to sitting EOB where he then completed stand pivot<w/c with no AD and Mod-Max A going towards Rt side. Min A for sit<stand at the sink, Mod A for dynamic balance due to Rt lean at times when fatigued during activity (I.e. pericare and elevating LB garments over hips). Pt allowed OT to position Rt LE into modified figure 4 during LB washing and dressing. Pt limited by cramping, but was able to doff the Rt gripper sock himself! Min A for overhead shirt with pt initiating hemi techniques, assist needed to pull shirt down on the Rt side due to scapular winging. Pt able to integrate Rt UE as gross stabilizer and assist throughout, including during oral care, however he was very self limiting when OT suggested other ways he could integrate limb functionally. Mod A for squat pivot<recliner going towards Rt side. Educated pt on recripricol scooting beforehand with good carryover. Significantly increased time required to ensure pt was comfortable in recliner as  pt is very particular about things. Left him with all needs within reach and safety belt fastened.   Therapy Documentation Precautions:  Precautions Precautions: Fall Precaution Comments: R hemiparesis Restrictions Weight Bearing Restrictions: No Pain: Pain Assessment Pain Scale: 0-10 Pain Score: 0-No pain ADL:       Therapy/Group: Individual Therapy  Laguana Desautel A Mykael Trott 06/30/2019, 4:05 PM

## 2019-06-30 NOTE — Progress Notes (Signed)
[place] PHYSICAL MEDICINE & REHABILITATION PROGRESS NOTE   Subjective/Complaints:  No issues overnite, some positional vertigo  Mainly turning per righ t per PT , no nystagmus noted    ROS: Denies CP, SOB, N/V/D  Objective:   No results found. No results for input(s): WBC, HGB, HCT, PLT in the last 72 hours. No results for input(s): NA, K, CL, CO2, GLUCOSE, BUN, CREATININE, CALCIUM in the last 72 hours.  Intake/Output Summary (Last 24 hours) at 06/30/2019 0827 Last data filed at 06/29/2019 1923 Gross per 24 hour  Intake 120 ml  Output 250 ml  Net -130 ml     Physical Exam: Vital Signs Blood pressure 133/68, pulse 67, temperature (!) 97.4 F (36.3 C), temperature source Oral, resp. rate 18, height 6' (1.829 m), weight 64.4 kg, SpO2 99 %.   General: No acute distress Mood and affect are appropriate Heart: Regular rate and rhythm no rubs murmurs or extra sounds Lungs: Clear to auscultation, breathing unlabored, no rales or wheezes Abdomen: Positive bowel sounds, soft nontender to palpation, nondistended Extremities: No clubbing, cyanosis, or edema Skin: No evidence of breakdown, no evidence of rash  Musculoskeletal: Full range of motion in all 4 extremities. No joint swelling  HOH Motor: RUE: Shoulder adduction 3 -/5, elbow flexion/extension 3+/5, handgrip 3-/5  LUE: 4+/5 proximal distal LLE: 4+/5 proximal distal RLE: 2 -/5 proximal to distal  Assessment/Plan: 1. Functional deficits secondary to CVA L MCA and subdural  which require 3+ hours per day of interdisciplinary therapy in a comprehensive inpatient rehab setting.  Physiatrist is providing close team supervision and 24 hour management of active medical problems listed below.  Physiatrist and rehab team continue to assess barriers to discharge/monitor patient progress toward functional and medical goals  Care Tool:  Bathing  Bathing activity did not occur: Refused Body parts bathed by patient: Face, Right  arm, Chest, Abdomen, Front perineal area, Right upper leg, Left upper leg   Body parts bathed by helper: Left arm, Buttocks, Right lower leg, Left lower leg Body parts n/a: Front perineal area, Buttocks, Right lower leg, Left lower leg   Bathing assist Assist Level: Moderate Assistance - Patient 50 - 74%     Upper Body Dressing/Undressing Upper body dressing   What is the patient wearing?: Pull over shirt    Upper body assist Assist Level: Minimal Assistance - Patient > 75%    Lower Body Dressing/Undressing Lower body dressing      What is the patient wearing?: Incontinence brief     Lower body assist Assist for lower body dressing: Moderate Assistance - Patient 50 - 74%     Toileting Toileting    Toileting assist Assist for toileting: Maximal Assistance - Patient 25 - 49%     Transfers Chair/bed transfer  Transfers assist     Chair/bed transfer assist level: Moderate Assistance - Patient 50 - 74%     Locomotion Ambulation   Ambulation assist   Ambulation activity did not occur: Safety/medical concerns  Assist level: Moderate Assistance - Patient 50 - 74% Assistive device: Walker-rolling Max distance: 50   Walk 10 feet activity   Assist  Walk 10 feet activity did not occur: Safety/medical concerns  Assist level: Moderate Assistance - Patient - 50 - 74% Assistive device: Walker-rolling   Walk 50 feet activity   Assist Walk 50 feet with 2 turns activity did not occur: Safety/medical concerns  Assist level: Moderate Assistance - Patient - 50 - 74% Assistive device: Walker-rolling  Walk 150 feet activity   Assist Walk 150 feet activity did not occur: Safety/medical concerns         Walk 10 feet on uneven surface  activity   Assist Walk 10 feet on uneven surfaces activity did not occur: Safety/medical concerns         Wheelchair     Assist Will patient use wheelchair at discharge?: Yes(LT goals set)      Wheelchair assist  level: Supervision/Verbal cueing Max wheelchair distance: 50    Wheelchair 50 feet with 2 turns activity    Assist        Assist Level: Supervision/Verbal cueing   Wheelchair 150 feet activity     Assist      Assist Level: Maximal Assistance - Patient 25 - 49%   Blood pressure 133/68, pulse 67, temperature (!) 97.4 F (36.3 C), temperature source Oral, resp. rate 18, height 6' (1.829 m), weight 64.4 kg, SpO2 99 %.  Medical Problem List and Plan: 1.  Left parafalcine subdural hematoma and subarachnoid hemorrhage, Left MCA M3 branch embolic stroke   Continue CIR PT, OT. SLP 2.  Antithrombotics: -DVT/anticoagulation:DC Heparin   Mechanical: Sequential compression devices, below knee Bilateral lower extremities per Neuro Dr Leonie Man, ok to resume Xarelto, will consult pharmacy             -antiplatelet therapy: resuming Plavix  3. Pain Management: N/a 4. Mood: LCSW to follow for evaluation and support.              -antipsychotic agents: N/A 5. Neuropsych: This patient is capable of making decisions on his own behalf. 6. Skin/Wound Care: Routine pressure relief measures.  7. Fluids/Electrolytes/Nutrition: Monitor I/Os. Continue Vitamin B12 supplement 8. Acute on chronic CHF: On Coreg , lipitor, imdur and Lasix.  Monitor for signs of overload--monitor weights daily.  Filed Weights   06/27/19 0306 06/28/19 0415 06/30/19 0500  Weight: 65.6 kg 66.6 kg 64.4 kg   Some fluctuation  on 4/2 9. CAD/CAF/ s/p AICD: Continues imdur, Coreg and lipitor. Monitor HR tid.  Vitals:   06/29/19 1929 06/30/19 0651  BP: 98/62 133/68  Pulse: 79 67  Resp: 18 18  Temp: 98 F (36.7 C) (!) 97.4 F (36.3 C)  SpO2: 99% 99%   Controlled on 4/2 10. Hypokalemia:   Resolved  11. ABLA: Resolved 12. Macular degeneration: Legally blind.  Needs magnifying glass to read normal print  13. Sleep disturbance: Trazodone 25mg  HS PRN 14.  Post stroke dysphagia-D3 thins liq - advance per SLP- still requires  supervision  15. Heat rash: Has benadryl prn; used at 2am last night.  Continue Sarna lotion and Triamcinolone ointment BID.  16. Aphasia- mainly expressive mild, SLP  17.  Acute lower UTI- still a little burning, pt with multiple species on culture, afebrile    Empiric Macrobid started on 3/27 switched to Keflex because of diarrhea  18.  Hyponatremia- asymptomatic LAsix, , check serum and urine osmolality today - normal serum osmo with dliute urine, likely not  SIADH  19.  Chronic vertigo since childhood pt does not recall inner ear infection used meclizine at home , PT to further assess LOS: 16 days A FACE TO [place] E Aurorah Schlachter 06/30/2019, 8:27 AM

## 2019-06-30 NOTE — Plan of Care (Signed)
  Problem: Consults Goal: RH STROKE PATIENT EDUCATION Description: See Patient Education module for education specifics  06/30/2019 1000 by Amanda Cockayne, LPN Outcome: Progressing 06/30/2019 0958 by Amanda Cockayne, LPN Outcome: Progressing   Problem: RH BOWEL ELIMINATION Goal: RH STG MANAGE BOWEL WITH ASSISTANCE Description: STG Manage Bowel with Assistance. 06/30/2019 1000 by Amanda Cockayne, LPN Outcome: Progressing 06/30/2019 0958 by Amanda Cockayne, LPN Outcome: Progressing Goal: RH STG MANAGE BOWEL W/MEDICATION W/ASSISTANCE Description: STG Manage Bowel with Medication with Assistance. 06/30/2019 1000 by Amanda Cockayne, LPN Outcome: Progressing 06/30/2019 0958 by Amanda Cockayne, LPN Outcome: Progressing   Problem: RH BLADDER ELIMINATION Goal: RH STG MANAGE BLADDER WITH ASSISTANCE Description: STG Manage Bladder With Assistance 06/30/2019 1000 by Amanda Cockayne, LPN Outcome: Progressing 06/30/2019 0958 by Amanda Cockayne, LPN Outcome: Progressing Goal: RH STG MANAGE BLADDER WITH MEDICATION WITH ASSISTANCE Description: STG Manage Bladder With Medication With Assistance. 06/30/2019 1000 by Amanda Cockayne, LPN Outcome: Progressing 06/30/2019 0958 by Amanda Cockayne, LPN Outcome: Progressing   Problem: RH SKIN INTEGRITY Goal: RH STG SKIN FREE OF INFECTION/BREAKDOWN 06/30/2019 1000 by Amanda Cockayne, LPN Outcome: Progressing 06/30/2019 0958 by Amanda Cockayne, LPN Outcome: Progressing Goal: RH STG MAINTAIN SKIN INTEGRITY WITH ASSISTANCE Description: STG Maintain Skin Integrity With Assistance. 06/30/2019 1000 by Amanda Cockayne, LPN Outcome: Progressing 06/30/2019 0958 by Amanda Cockayne, LPN Outcome: Progressing   Problem: RH SAFETY Goal: RH STG ADHERE TO SAFETY PRECAUTIONS W/ASSISTANCE/DEVICE Description: STG Adhere to Safety Precautions With Assistance/Device. 06/30/2019 1000 by Amanda Cockayne, LPN Outcome: Progressing 06/30/2019 0958 by Amanda Cockayne, LPN Outcome: Progressing Goal: RH STG DECREASED RISK OF FALL WITH ASSISTANCE Description: STG Decreased Risk of Fall With Assistance. 06/30/2019 1000 by Amanda Cockayne, LPN Outcome: Progressing 06/30/2019 0958 by Amanda Cockayne, LPN Outcome: Progressing   Problem: RH PAIN MANAGEMENT Goal: RH STG PAIN MANAGED AT OR BELOW PT'S PAIN GOAL 06/30/2019 1000 by Amanda Cockayne, LPN Outcome: Progressing 06/30/2019 0958 by Amanda Cockayne, LPN Outcome: Progressing   Problem: RH KNOWLEDGE DEFICIT Goal: RH STG INCREASE KNOWLEDGE OF DIABETES 06/30/2019 1000 by Amanda Cockayne, LPN Outcome: Progressing 06/30/2019 0958 by Amanda Cockayne, LPN Outcome: Progressing Goal: RH STG INCREASE KNOWLEDGE OF HYPERTENSION 06/30/2019 1000 by Amanda Cockayne, LPN Outcome: Progressing 06/30/2019 0958 by Amanda Cockayne, LPN Outcome: Progressing Goal: RH STG INCREASE KNOWLEGDE OF HYPERLIPIDEMIA 06/30/2019 1000 by Amanda Cockayne, LPN Outcome: Progressing 06/30/2019 0958 by Amanda Cockayne, LPN Outcome: Progressing Goal: RH STG INCREASE KNOWLEDGE OF STROKE PROPHYLAXIS 06/30/2019 1000 by Amanda Cockayne, LPN Outcome: Progressing 06/30/2019 0958 by Amanda Cockayne, LPN Outcome: Progressing

## 2019-06-30 NOTE — Progress Notes (Signed)
Speech Language Pathology Daily Session Note  Patient Details  Name: AKIL RYBA MRN: [id number] Date of Birth: March 01, 1941  Today's Date: 06/30/2019 SLP Individual Time: 1001-1057 SLP Individual Time Calculation (min): 56 min  Short Term Goals: Week 3: SLP Short Term Goal 1 (Week 3): STG=LTG due to remaining LOS  Skilled Therapeutic Interventions: Pt was seen for skilled ST targeting cognitive goals. Pt was lethargic and reported nausea, however he had already been premedicated. He did also report perception it was more difficult to organize his thoughts and stay on task today, which SLP noted to be accurate during session. Pt required Mod A verbal cues for redirection to tasks and topics of functional conversation regarding current cognitive deficits. Pt initially with 50% accuracy during a functional verbal recall task, however when pt implemented rehearsal and association strategies, his immediate recall improved to 100% during additional verbal recall task. Pt continues to require Max A regarding rationale behind ST cognitive interventions and intellectual awareness of short term call, problem solving, and attention deficits s/p acute CVA, however was more receptive to education today in comparison to previous sessions and recognized that he will have to be able to implement strategies for verbal recall of information now that he cannot reliably use visual aids such as calendar due to visual deficits. He acknowledges the need for 24/7 supervision at discharge for safety reasons. Pt left laying in bed with alarm set and needs within reach. Continue per current plan of care.        Pain Pain Assessment Pain Scale: 0-10 Pain Score: 0-No pain  Therapy/Group: Individual Therapy  Arbutus Leas 06/30/2019, 6:59 AM

## 2019-06-30 NOTE — Progress Notes (Signed)
Physical Therapy Weekly Progress Note  Patient Details  Name: Mark Clayton MRN: [id number] Date of Birth: 02/12/1941  Beginning of progress report period: June 24, 2019 End of progress report period: June 30, 2019  Today's Date: 06/30/2019 PT Individual Time: 0800-0925 PT Individual Time Calculation (min): 85 min   Patient has met 4 of 4 short term goals.  Pt is making steady progress towards LTGPt is currently min assist for bed mobility from partially elevated surface. Min assist sit<>stand and squat pivot transfers. Mod assist stand pivot tranfers, to and WC using RW due to poor weight shifting proprioception of the RLE. Supervision assist for WC mobility using hemi technique.   Patient continues to demonstrate the following deficits muscle weakness, muscle joint tightness and muscle paralysis, decreased cardiorespiratoy endurance, abnormal tone, unbalanced muscle activation, motor apraxia, decreased coordination and decreased motor planning, decreased visual acuity and decreased visual perceptual skills, decreased midline orientation and decreased attention to right, central origin and peripheral and decreased sitting balance, decreased standing balance, decreased postural control, hemiplegia and decreased balance strategies and therefore will continue to benefit from skilled PT intervention to increase functional independence with mobility.  Patient progressing toward long term goals..  Continue plan of care.  PT Short Term Goals Week 2:  PT Short Term Goal 1 (Week 2): PT will transfer to [facility] with min assist consistently PT Short Term Goal 1 - Progress (Week 2): Met PT Short Term Goal 2 (Week 2): Pt will ambulate with mod assist x 90f and LRAD PT Short Term Goal 2 - Progress (Week 2): Met PT Short Term Goal 3 (Week 2): Pt will propell WC [id number] supervision assist PT Short Term Goal 3 - Progress (Week 2): Met PT Short Term Goal 4 (Week 2): Pt will perform bed mobility with min  assist consistently PT Short Term Goal 4 - Progress (Week 2): Met Week 3:  PT Short Term Goal 1 (Week 3): STG=LTG due to ELOS  Skilled Therapeutic Interventions/Progress Updates:   Pt received supine in bed and agreeable to PT. Supine>sit transfer with min for RLE management from semireclined position assist.   PT assisted pt to don pants standing EOB with min assist for balance with UE support on RW and clothing management from PT. [facility] for RLE management and improved weight shifting L. Pt noted to have increased pushers syndrome in standing today.   [facility] donned. Gait training with AFO x 303fith mod-max assist for advancement of the RLE and adequate L weight shift to off load clear RLE in swing due to increasing pushing tendencies. Stand>sit with min assist and cues for UE placement improve control with descent.   Sit>supine with min assist for RLE management. Rolling R into prone. Once in Prone pt reports severe vertigo s/s. Attempted to rest in prone to allow s/s to pass, but pt reports no decrease in severity after 1.5 min. Returns to supine through roll back to R. No nystagmus noted. Supine>sit with mod assist due to nausea and dizzines with moderate cues for improved trunkal control and RUE placement. Sitting rest break EOB to allow mild decrease in symptoms.   Stand pivot to the L with mod assist, increased R lateral lean noted. Pt performed sit<>stand in front of mat table x2 with min assist and no UE support. Pt able to attain tall kneeling on EOB with max assist, but reports severe increase  in dizziness once in tall kneeling. Returned to sitting in [facility] with mod-max assist for safety and RLE placement.   Once pt reports mild decrease in s/s. Sit<>stand in parallel bars. Forward step with LLE x 10 to force WB through RLE. Lateral weight shift R and L with min assist to maintain erect posture and increase symmetry  in BLE.   Stand pivot L with min assist and increased time with max cues for sequencing. Squat pivot to the R with minassist and moderate cues for set up. Pt requesting to return to bed. Pt returned to room and performed stand pivot transfer to bed with UE support on Rail and min assist. Sit>supine completed with min assist at the RLE. and left supine in bed with call bell in reach and all needs met.          Therapy Documentation Precautions:  Precautions Precautions: Fall Precaution Comments: R hemiparesis Restrictions Weight Bearing Restrictions: No Vital Signs: Therapy Vitals Temp: (!) 97.4 F (36.3 C) Temp Source: Oral Pulse Rate: 67 Resp: 18 BP: 133/68 Patient Position (if appropriate): Sitting Oxygen Therapy SpO2: 99 % O2 Device: Room Air Pain: Pain Assessment Pain Scale: 0-10 Pain Score: 0-No pain   Therapy/Group: Individual Therapy  Lorie Phenix 06/30/2019, 9:52 AM

## 2019-07-01 ENCOUNTER — Inpatient Hospital Stay (HOSPITAL_COMMUNITY): Payer: Medicare Other | Admitting: Occupational Therapy

## 2019-07-01 ENCOUNTER — Inpatient Hospital Stay (HOSPITAL_COMMUNITY): Payer: Medicare Other | Admitting: Physical Therapy

## 2019-07-01 MED ORDER — SORBITOL 70 % SOLN
30.0000 mL | Freq: Once | Status: DC
  Filled 2019-07-01: qty 30

## 2019-07-01 NOTE — Progress Notes (Signed)
Occupational Therapy Session Note  Patient Details  Name: Mark Clayton MRN: [id number] Date of Birth: April 26, 1940  Today's Date: 07/01/2019 OT Individual Time: [id number] OT Individual Time Calculation (min): 44 min    Short Term Goals: Week 3:  OT Short Term Goal 1 (Week 3): STG= LTG secondary to ELOS  Skilled Therapeutic Interventions/Progress Updates:    Pt greeted in bed, reporting stomach discomfort and stating he was incontinent of bowels in brief. Pt agreeable to transfer to the [facility] to continue eliminating bowels. Min A for supine<sit and Mod A for squat pivot<drop arm BSC going towards Rt side. Sit<stand with RW completed with Mod A for power up once Rt foot was repositioned for safety. After OT removed soiled brief, pt continued with bowel void. Close supervision for sitting balance as pt often shifted and repositioned himself to get more comfortable on BSC. Reminded pt of recripricol scooting technique as he'd c/o of not being able to move himself. Pt did well with recripricol scooting to readjust hips as needed, requiring supervision for this aspect. Worked on sit<stands and standing balance while OT completed perihygiene and donned clean brief. Min-Mod A for sit<stands, CGA for balance with bilateral support on RW. Note that pt can exhibit mild pushing tendencies during sit<stand. He was able to stand for ~30 second intervals. After hygiene was fully completed, he reported having to void again. Therefore hygiene and brief change was completed for a 2nd time with the same assist as written above. Mod A for squat pivot<bed going towards Lt side. Mod A fading to supervision assist for scooting up towards [place], pt once again exhibiting some pushing tendencies. Mod A for returning to supine where pt was then repositioned for comfort. Afterwards he reported that he voided bowels again. Pt left in care of NT for brief change.     Therapy Documentation Precautions:  Precautions Precautions:  Fall Precaution Comments: R hemiparesis Restrictions Weight Bearing Restrictions: No Vital Signs: Therapy Vitals Temp: 98 F (36.7 C) Temp Source: Oral Pulse Rate: 80 Resp: 18 BP: (!) 92/53 Patient Position (if appropriate): Lying Oxygen Therapy SpO2: 97 % O2 Device: Room Air ADL:       Therapy/Group: Individual Therapy  Tanicia Wolaver A Minah Axelrod 07/01/2019, 3:52 PM

## 2019-07-01 NOTE — Progress Notes (Signed)
Physical Therapy Session Note  Patient Details  Name: Mark Clayton MRN: [id number] Date of Birth: [birth year]  Today's Date: 07/01/2019 PT Individual Time: 1535-1630 PT Individual Time Calculation (min): 55 min   Short Term Goals: Week 3:  PT Short Term Goal 1 (Week 3): STG=LTG due to ELOS  Skilled Therapeutic Interventions/Progress Updates:   Pt received supine in bed and agreeable to PT. Supine>sit transfer with min assist and min cues for awareness of R UE and Trunk.   PT assisted pt to don pants with max assist to time management. Min assist sit<>stand at EOB while PT pulled pants to waist. Stand pivot transfer to [facility] with RW and mod assist for safety and proper LE placement. Pt transported to rehab gym.   Gait training in rehab gym  With RW, level 2 tband to promote DF, knee and hip flexion and R AFO x 67f. Pt noted to have improved lateral weight shift to advance the RLE, but decrease hip/knee activation into flexion compared to Thursday. Additional gait training with RW and AFO only x 435fwith mod-max assist to advance the RLE; pt noted to activate quads into extension only when attempting to advance the RLE when unaided by PT.  Side stepping R and L x 10 ft bil with mod assist to the L and max assist for hip abduction on the RLE to the R. Noted improvement in weight shifting with decreased pushing on this day.   Blocked practice squat pivot transfer to and from mat table with mod assist progressing to min assist with moderate cues for set up, sequencing, and awareness of RLE/RUE for safety.   Pt returned to room and performed stand pivot transfer to bed with min assist and LUE on rail. Sit>supine completed with min assist at RLE, and left supine in bed with call bell in reach and all needs met.        Therapy Documentation Precautions:  Precautions Precautions: Fall Precaution Comments: R hemiparesis Restrictions Weight Bearing Restrictions: No    Vital Signs: Therapy  Vitals Temp: 98 F (36.7 C) Temp Source: Oral Pulse Rate: 80 Resp: 18 BP: (!) 92/53 Patient Position (if appropriate): Lying Oxygen Therapy SpO2: 97 % O2 Device: Room Air Pain: denies  Therapy/Group: Individual Therapy  AuLorie Phenix/05/2019, 4:40 PM

## 2019-07-01 NOTE — Progress Notes (Signed)
[place] PHYSICAL MEDICINE & REHABILITATION PROGRESS NOTE   Subjective/Complaints:  Notes that he's been a little constipated. Refusing various meds per RN.   ROS: Patient denies fever, rash, sore throat, blurred vision, nausea, vomiting, diarrhea, cough, shortness of breath or chest pain, joint or back pain, headache, or mood change.   Objective:   No results found. No results for input(s): WBC, HGB, HCT, PLT in the last 72 hours. No results for input(s): NA, K, CL, CO2, GLUCOSE, BUN, CREATININE, CALCIUM in the last 72 hours.  Intake/Output Summary (Last 24 hours) at 07/01/2019 0920 Last data filed at 07/01/2019 0300 Gross per 24 hour  Intake 480 ml  Output 950 ml  Net -470 ml     Physical Exam: Vital Signs Blood pressure 106/60, pulse 70, temperature 98.3 F (36.8 C), resp. rate 16, height 6' (1.829 m), weight 64.9 kg, SpO2 94 %.   Constitutional: No distress . Vital signs reviewed. HEENT: EOMI, oral membranes moist Neck: supple Cardiovascular: RRR without murmur. No JVD    Respiratory/Chest: CTA Bilaterally without wheezes or rales. Normal effort    GI/Abdomen: BS +, non-tender, non-distended Ext: no clubbing, cyanosis, or edema Psych: pleasant and cooperative Skin: No evidence of breakdown, no evidence of rash  Musculoskeletal: Full range of motion in all 4 extremities. No joint swelling  HOH Motor: RUE: Shoulder adduction 3-/5, elbow flexion/extension 3+/5, handgrip 3-/5  LUE: 4+/5 proximal distal LLE: 4+/5 proximal distal RLE: 2-/5 proximal to distal  Assessment/Plan: 1. Functional deficits secondary to CVA L MCA and subdural  which require 3+ hours per day of interdisciplinary therapy in a comprehensive inpatient rehab setting.  Physiatrist is providing close team supervision and 24 hour management of active medical problems listed below.  Physiatrist and rehab team continue to assess barriers to discharge/monitor patient progress toward functional and  medical goals  Care Tool:  Bathing  Bathing activity did not occur: Refused Body parts bathed by patient: Face, Right arm, Chest, Abdomen, Front perineal area, Right upper leg, Left upper leg, Buttocks, Left lower leg, Left arm   Body parts bathed by helper: Right lower leg Body parts n/a: Front perineal area, Buttocks, Right lower leg, Left lower leg   Bathing assist Assist Level: Minimal Assistance - Patient > 75%     Upper Body Dressing/Undressing Upper body dressing   What is the patient wearing?: Pull over shirt    Upper body assist Assist Level: Minimal Assistance - Patient > 75%    Lower Body Dressing/Undressing Lower body dressing      What is the patient wearing?: Incontinence brief     Lower body assist Assist for lower body dressing: Maximal Assistance - Patient 25 - 49%     Toileting Toileting Toileting Activity did not occur (Clothing management and hygiene only): Refused  Toileting assist Assist for toileting: Maximal Assistance - Patient 25 - 49%     Transfers Chair/bed transfer  Transfers assist     Chair/bed transfer assist level: Moderate Assistance - Patient 50 - 74%     Locomotion Ambulation   Ambulation assist   Ambulation activity did not occur: Safety/medical concerns  Assist level: Moderate Assistance - Patient 50 - 74% Assistive device: Walker-rolling Max distance: 50   Walk 10 feet activity   Assist  Walk 10 feet activity did not occur: Safety/medical concerns  Assist level: Moderate Assistance - Patient - 50 - 74% Assistive device: Walker-rolling   Walk 50 feet activity   Assist Walk 50 feet with 2  turns activity did not occur: Safety/medical concerns  Assist level: Moderate Assistance - Patient - 50 - 74% Assistive device: Walker-rolling    Walk 150 feet activity   Assist Walk 150 feet activity did not occur: Safety/medical concerns         Walk 10 feet on uneven surface  activity   Assist Walk 10 feet  on uneven surfaces activity did not occur: Safety/medical concerns         Wheelchair     Assist Will patient use wheelchair at discharge?: Yes(LT goals set)      Wheelchair assist level: Supervision/Verbal cueing Max wheelchair distance: 50    Wheelchair 50 feet with 2 turns activity    Assist        Assist Level: Supervision/Verbal cueing   Wheelchair 150 feet activity     Assist      Assist Level: Maximal Assistance - Patient 25 - 49%   Blood pressure 106/60, pulse 70, temperature 98.3 F (36.8 C), resp. rate 16, height 6' (1.829 m), weight 64.9 kg, SpO2 94 %.  Medical Problem List and Plan: 1.  Left parafalcine subdural hematoma and subarachnoid hemorrhage, Left MCA M3 branch embolic stroke   Continue CIR PT, OT. SLP 2.  Antithrombotics: -DVT/anticoagulation:DC Heparin   Mechanical: Sequential compression devices, below knee Bilateral lower extremities per Neuro Dr Leonie Man, ok to resume Xarelto              -antiplatelet therapy: resuming Plavix  3. Pain Management: N/a 4. Mood: LCSW to follow for evaluation and support.              -antipsychotic agents: N/A 5. Neuropsych: This patient is capable of making decisions on his own behalf. 6. Skin/Wound Care: Routine pressure relief measures.  7. Fluids/Electrolytes/Nutrition: Monitor I/Os. Continue Vitamin B12 supplement 8. Acute on chronic CHF: On Coreg , lipitor, imdur and Lasix.  Monitor for signs of overload--monitor weights daily.  Filed Weights   06/28/19 0415 06/30/19 0500 07/01/19 0426  Weight: 66.6 kg 64.4 kg 64.9 kg   Weights stable to decreased 4/3 9. CAD/CAF/ s/p AICD: Continues imdur, Coreg and lipitor. Monitor HR tid.  Vitals:   06/30/19 2011 07/01/19 0426  BP: 98/64 106/60  Pulse: 76 70  Resp: 16 16  Temp: 97.7 F (36.5 C) 98.3 F (36.8 C)  SpO2: 100% 94%   Controlled on 4/3 10. Hypokalemia:   Resolved  11. ABLA: Resolved 12. Macular degeneration: Legally blind.  Needs  magnifying glass to read normal print  13. Sleep disturbance: Trazodone 25mg  HS PRN 14.  Post stroke dysphagia-D3 thins liq - advance per SLP- still requires supervision  15. Heat rash: Has benadryl prn; used at 2am last night.  Continue Sarna lotion and Triamcinolone ointment BID.  16. Aphasia- mainly expressive mild, SLP  17.  Acute lower UTI- still a little burning, pt with multiple species on culture, afebrile    Empiric Macrobid started on 3/27 switched to Keflex because of diarrhea  18.  Hyponatremia- asymptomatic LAsix, , check serum and urine osmolality today - normal serum osmo with dliute urine, likely not  SIADH   -recheck labs 4/5 19.  Chronic vertigo since childhood pt does not recall inner ear infection used meclizine at home , PT to further assess 20. Constipation: will try sorbitol today  -encouraged him to take miralax as ordered   LOS: 17 days A FACE TO FACE EVALUATION WAS PERFORMED  Meredith Staggers 07/01/2019, 9:20 AM

## 2019-07-01 NOTE — Plan of Care (Signed)
  Problem: Consults Goal: RH STROKE PATIENT EDUCATION Description: See Patient Education module for education specifics  Outcome: Progressing   Problem: RH BOWEL ELIMINATION Goal: RH STG MANAGE BOWEL WITH ASSISTANCE Description: STG Manage Bowel with Assistance. Outcome: Progressing Goal: RH STG MANAGE BOWEL W/MEDICATION W/ASSISTANCE Description: STG Manage Bowel with Medication with Assistance. Outcome: Progressing   Problem: RH BLADDER ELIMINATION Goal: RH STG MANAGE BLADDER WITH ASSISTANCE Description: STG Manage Bladder With Assistance Outcome: Progressing Goal: RH STG MANAGE BLADDER WITH MEDICATION WITH ASSISTANCE Description: STG Manage Bladder With Medication With Assistance. Outcome: Progressing   Problem: RH SKIN INTEGRITY Goal: RH STG SKIN FREE OF INFECTION/BREAKDOWN Outcome: Progressing Goal: RH STG MAINTAIN SKIN INTEGRITY WITH ASSISTANCE Description: STG Maintain Skin Integrity With Assistance. Outcome: Progressing   Problem: RH SAFETY Goal: RH STG ADHERE TO SAFETY PRECAUTIONS W/ASSISTANCE/DEVICE Description: STG Adhere to Safety Precautions With Assistance/Device. Outcome: Progressing Goal: RH STG DECREASED RISK OF FALL WITH ASSISTANCE Description: STG Decreased Risk of Fall With Assistance. Outcome: Progressing   Problem: RH PAIN MANAGEMENT Goal: RH STG PAIN MANAGED AT OR BELOW PT'S PAIN GOAL Outcome: Progressing   Problem: RH KNOWLEDGE DEFICIT Goal: RH STG INCREASE KNOWLEDGE OF DIABETES Outcome: Progressing Goal: RH STG INCREASE KNOWLEDGE OF HYPERTENSION Outcome: Progressing Goal: RH STG INCREASE KNOWLEGDE OF HYPERLIPIDEMIA Outcome: Progressing Goal: RH STG INCREASE KNOWLEDGE OF STROKE PROPHYLAXIS Outcome: Progressing

## 2019-07-02 ENCOUNTER — Inpatient Hospital Stay (HOSPITAL_COMMUNITY): Payer: Medicare Other | Admitting: Speech Pathology

## 2019-07-02 NOTE — Progress Notes (Signed)
Patient is requesting medication for sleep, states he " hasnt had a good night rest since he has been here" Sleep chart monitoring continue indication sleeping  approximately 4-5 hours/12 hours of sleep. Voids incontinent and continent episodes encourage urinal, Call bell, and bed alarm on, no acute distress

## 2019-07-02 NOTE — Progress Notes (Signed)
Patient continue with sleep chart monitoring only resting for short interval,prn medication administered  for c/o back pain, reposition and monitored.

## 2019-07-02 NOTE — Progress Notes (Signed)
Speech Language Pathology Daily Session Note  Patient Details  Name: Mark Clayton MRN: [id number] Date of Birth: 1941/01/14  Today's Date: 07/02/2019 SLP Individual Time: [id number] SLP Individual Time Calculation (min): 40 min  Short Term Goals: Week 3: SLP Short Term Goal 1 (Week 3): STG=LTG due to remaining LOS  Skilled Therapeutic Interventions:  Pt was seen for skilled ST targeting cognitive goals.  SLP facilitated the session with a pipe tree task to address problem solving goals.  Pt could recreate pictures of structures with pieces of PVC pipe with supervision verbal cues to recognize and correct errors following set up of materials to accommodate for visual deficits (keeping similarly sized pieces together, separating out connecting pieces by function).   Pt was left in bed with bed alarm set and call bell within reach.  Continue per current plan of care.    Pain Pain Assessment Pain Scale: 0-10 Pain Score: 0-No pain  Therapy/Group: Individual Therapy  Zahari Fazzino, Selinda Orion 07/02/2019, 9:39 AM

## 2019-07-03 ENCOUNTER — Inpatient Hospital Stay (HOSPITAL_COMMUNITY): Payer: Medicare Other | Admitting: Speech Pathology

## 2019-07-03 ENCOUNTER — Inpatient Hospital Stay (HOSPITAL_COMMUNITY): Payer: Medicare Other | Admitting: Occupational Therapy

## 2019-07-03 ENCOUNTER — Inpatient Hospital Stay (HOSPITAL_COMMUNITY): Payer: Medicare Other | Admitting: Physical Therapy

## 2019-07-03 ENCOUNTER — Encounter (INDEPENDENT_AMBULATORY_CARE_PROVIDER_SITE_OTHER): Payer: Medicare Other | Admitting: Ophthalmology

## 2019-07-03 ENCOUNTER — Inpatient Hospital Stay (HOSPITAL_COMMUNITY): Payer: Medicare Other

## 2019-07-03 LAB — BASIC METABOLIC PANEL
Anion gap: 10 (ref 5–15)
BUN: 8 mg/dL (ref 8–23)
CO2: 26 mmol/L (ref 22–32)
Calcium: 9.3 mg/dL (ref 8.9–10.3)
Chloride: 95 mmol/L — ABNORMAL LOW (ref 98–111)
Creatinine, Ser: 1.14 mg/dL (ref 0.61–1.24)
GFR calc Af Amer: >60 mL/min (ref >60)
GFR calc non Af Amer: >60 mL/min (ref >60)
Glucose, Bld: 117 mg/dL — ABNORMAL HIGH (ref 70–99)
Potassium: 4 mmol/L (ref 3.5–5.1)
Sodium: 131 mmol/L — ABNORMAL LOW (ref 135–145)

## 2019-07-03 LAB — CBC
HCT: 36.8 % — ABNORMAL LOW (ref 39.0–52.0)
Hemoglobin: 12 g/dL — ABNORMAL LOW (ref 13.0–17.0)
MCH: 28.4 pg (ref 26.0–34.0)
MCHC: 32.6 g/dL (ref 30.0–36.0)
MCV: 87.2 fL (ref 80.0–100.0)
Platelets: 204 10*3/uL (ref 150–400)
RBC: 4.22 MIL/uL (ref 4.22–5.81)
RDW: 15.1 % (ref 11.5–15.5)
WBC: 5.1 10*3/uL (ref 4.0–10.5)
nRBC: 0 % (ref 0.0–0.2)

## 2019-07-03 NOTE — Progress Notes (Addendum)
Pt had a hard time falling asleep earlier during the night, pt had c/o of heart was pounding, vitals and and pulse taken , everything was within a normal range. pt stated that he was sleepy but couldn't fall asleep and didn't understand. Pt feel asleep around 2 am.  Call bell within reach , no signs of distress or c/o of pain in head (prn meds given) , no other c/o noted

## 2019-07-03 NOTE — Progress Notes (Signed)
Physical Therapy Session Note  Patient Details  Name: Mark Clayton MRN: [id number] Date of Birth: [birth year]  Today's Date: 07/03/2019 PT Individual Time: 0900-0950 PT Individual Time Calculation (min): 50 min   Short Term Goals: Week 3:  PT Short Term Goal 1 (Week 3): STG=LTG due to ELOS  Skilled Therapeutic Interventions/Progress Updates: Pt presented in w/c agreeable to therapy. Pt denies pain initially during session however increased c/o pain at L hip during session with nsg notified. PTA donned sneakers and AFO and transported pt to rehab gym for time management. PTA instructed pt in locking/unlocking breaks and locking/unlocking leg rests which pt was able to perform with verbal cues and increased time. Pt participated in blocked practice squat pivot transfers to/from mat both L and R. Pt required increased time and increased effort noted however was able to perform CGA to R and minA to L. Participated in STS x 3 for forced use of LLE. Pt ambulated x 75f with RW and mod to maxA. Pt required maxA to advance RLE with facilitation at hips to increase wt shift to L. Pt returned to w/c and propelled w/c x 584fwith minA for turns. Pt noted to get increasingly frustrated when attempting turns requriing verbal cues for sequencing. Pt with improved technique on strait paths. Pt transported remaining distance to room and pt requesting to return to bed. Performed squat pivot transfer with minA and required minA for sit to supine. Pt repositioned to comfort and left with bed alarm on, call bell within reach and needs met.      Therapy Documentation Precautions:  Precautions Precautions: Fall Precaution Comments: R hemiparesis Restrictions Weight Bearing Restrictions: No General:   Vital Signs:      Therapy/Group: Individual Therapy  [place]  Meriel Kelliher, PTA  07/03/2019, 12:54 PM

## 2019-07-03 NOTE — Progress Notes (Signed)
Occupational Therapy Session Note  Patient Details  Name: Mark Clayton MRN: [id number] Date of Birth: 1940-10-02  Today's Date: 07/03/2019 OT Individual Time:  -       Short Term Goals: Week 3:  OT Short Term Goal 1 (Week 3): STG= LTG secondary to ELOS  Skilled Therapeutic Interventions/Progress Updates:  Pt recived supine in bed agreeable to OT intervention. Pt required MODA for supine>sit to recall hooking BLEs to advance BLEs to EOB. Pt completed x2 sit<>stands from EOB with RW and MIN A for LB dressing. MAX A to don pants from EOB and total A to don shoes. Pt completed squat pivot transfer from EOB>w/c with MIN A to pts L side. Pt transported to sink for seated grooming tasks noted improved functional use of RUE into ADL routine. Pt requires set- up assist for oral care d/t visual deficits to apply white tooth paste onto white brush. Set- up assist to apply Fixodent to dentures. Pt left seated in w/c with alarm belt activated and all needs within reach.   Therapy Documentation Precautions:  Precautions Precautions: Fall Precaution Comments: R hemiparesis Restrictions Weight Bearing Restrictions: No General:   Vital Signs: Therapy Vitals Temp: 98 F (36.7 C) Pulse Rate: 64 Resp: 18 BP: 122/78 Patient Position (if appropriate): Lying Oxygen Therapy SpO2: 100 % O2 Device: Room Air Pain: Pt reports pain in RLE during bed mobility. Pt reports legs cramping needing increased rest breaks as pain mgmt strategy.   Therapy/Group: Individual Therapy  [name] 07/03/2019, 8:23 AM

## 2019-07-03 NOTE — Progress Notes (Signed)
Patient remains hospitalized and no ICM remote transmission received.  Rescheduled ICM remote transmission for 07/19/2019.

## 2019-07-03 NOTE — Progress Notes (Signed)
Occupational Therapy Session Note  Patient Details  Name: Mark Clayton MRN: [id number] Date of Birth: 03/30/1941  Today's Date: 07/03/2019 OT Individual Time: 1100-1158 OT Individual Time Calculation (min): 58 min   Short Term Goals: Week 3:  OT Short Term Goal 1 (Week 3): STG= LTG secondary to ELOS  Skilled Therapeutic Interventions/Progress Updates:    Pt greeted in bed, declining participation in ADL tasks, opting instead to go outside for tx. Started with donning pants bedlevel as he stated his brief was clean. Pt needed Total A to thread [name] into pants due to onset of hip pain when attempting himself. Pt was able to elevate pants up over hips utilizing small rolls and one legged bridges with supervision. Min A for supine<sit. While EOB, OT donned his shoes with Total A. Mod A for squat pivot<w/c going towards the Rt side. After pt was escorted outside, set him up with a therapeutic activity for his Rt UE. Pt however wanted to converse with OT instead, discussing the history of [place] and how the city has evolved since he grew up. Pt becoming teary, expressing that sharing this personal history was positively impacting his psychosocial health. He reported that being outside was "the cat's meow" and expressed gratefulness multiple times for being able to go outdoors during tx. When pt was escorted back to the unit, OT encouraged him to stay sitting up in his w/c for lunch but pt adamantly declined. Min A for squat pivot<bed going towards Lt side and using the bedrail with his Lt hand. Mod A for transition to supine. Note that pt requires significantly increased time to be made comfortable in bed due to increased particularity. Left him with all needs within reach and bed alarm set.     Therapy Documentation Precautions:  Precautions Precautions: Fall Precaution Comments: R hemiparesis Restrictions Weight Bearing Restrictions: No Pain: in both hips. RN made aware at end of session that pt would  like some pain medicine to address   ADL:     Therapy/Group: Individual Therapy  Kayceon Oki A Lazaria Schaben 07/03/2019, 12:22 PM

## 2019-07-03 NOTE — Discharge Instructions (Addendum)
Inpatient Rehab Discharge Instructions  Mark Clayton Discharge date and time:  07/08/19  Activities/Precautions/ Functional Status: Activity: no lifting, driving, or strenuous exercise till cleared by MD Diet: cardiac diet  Wound Care: none needed    Functional status:  ___ No restrictions     ___ Walk up steps independently _X__ 24/7 supervision/assistance   ___ Walk up steps with assistance ___ Intermittent supervision/assistance  ___ Bathe/dress independently ___ Walk with walker     ___ Bathe/dress with assistance ___ Walk Independently    ___ Shower independently ___ Walk with assistance    _X__ Shower with assistance _X__ No alcohol     ___ Return to work/school ________   COMMUNITY REFERRALS UPON DISCHARGE:  Home Health: PT, OT, ST  Agency:                             Phone:[phone]  Medical Equipment/Items Ordered:Family declined recommended DME from the hospital; already purchased items recommended.   Special Instructions:    My questions have been answered and I understand these instructions. I will adhere to these goals and the provided educational materials after my discharge from the hospital.  Patient/Caregiver Signature _______________________________ Date __________  Clinician Signature _______________________________________ Date __________  Please bring this form and your medication list with you to all your follow-up doctor's appointments.    ==============================================  Information on my medicine - XARELTO (Rivaroxaban)  This medication education was reviewed with me or my healthcare representative as part of my discharge preparation.  The pharmacist that spoke with me during my hospital stay was:  Maryan Char Lippucci, RPH  Why was Xarelto prescribed for you? Xarelto was prescribed for you to reduce the risk of a blood clot forming that can cause a stroke if you have a medical condition called atrial fibrillation (a type of  irregular heartbeat).  What do you need to know about xarelto ? Take your Xarelto ONCE DAILY at the same time every day with your evening meal. If you have difficulty swallowing the tablet whole, you may crush it and mix in applesauce just prior to taking your dose.  Take Xarelto exactly as prescribed by your doctor and DO NOT stop taking Xarelto without talking to the doctor who prescribed the medication.  Stopping without other stroke prevention medication to take the place of Xarelto may increase your risk of developing a clot that causes a stroke.  Refill your prescription before you run out.  After discharge, you should have regular check-up appointments with your healthcare provider that is prescribing your Xarelto.  In the future your dose may need to be changed if your kidney function or weight changes by a significant amount.  What do you do if you miss a dose? If you are taking Xarelto ONCE DAILY and you miss a dose, take it as soon as you remember on the same day then continue your regularly scheduled once daily regimen the next day. Do not take two doses of Xarelto at the same time or on the same day.   Important Safety Information A possible side effect of Xarelto is bleeding. You should call your healthcare provider right away if you experience any of the following: ? Bleeding from an injury or your nose that does not stop. ? Unusual colored urine (red or dark [place]) or unusual colored stools (red or black). ? Unusual bruising for unknown reasons. ? A serious fall or if you hit your head (  even if there is no bleeding).  Some medicines may interact with Xarelto and might increase your risk of bleeding while on Xarelto. To help avoid this, consult your healthcare provider or pharmacist prior to using any new prescription or non-prescription medications, including herbals, vitamins, non-steroidal anti-inflammatory drugs (NSAIDs) and supplements.  This website has more  information on Xarelto: [email or web address]

## 2019-07-03 NOTE — Progress Notes (Signed)
Speech Language Pathology Daily Session Note  Patient Details  Name: Mark Clayton MRN: [id number] Date of Birth: Jul 31, 1940  Today's Date: 07/03/2019 SLP Individual Time: [id number] SLP Individual Time Calculation (min): 58 min  Short Term Goals: Week 3: SLP Short Term Goal 1 (Week 3): STG=LTG due to remaining LOS  Skilled Therapeutic Interventions: Pt was seen for skilled ST targeting cognition. Pt required Min A verbal cues for sequencing and functional problem solving when repositioning from laying in bed to sitting edge of bed, and returning to reclined position in bed at end of session. Pt continues to be Mod I for correcting very few phonemic errors during conversation. He completed a 4-step action card sequencing task with Supervision A for error detection and correction, increased Min A for these skills when complexity increased to a 6-step action card sequencing task. Pt did accurately recall details of most recent ST session (yesterday) without assistance. Pt left laying in bed with alarm set and needs within reach. Continue per current plan of care.        Pain Pain Assessment Pain Scale: 0-10 Pain Score: 0-No pain  Therapy/Group: Individual Therapy  Mark Clayton 07/03/2019, 7:18 AM

## 2019-07-03 NOTE — Plan of Care (Signed)
  Problem: Consults Goal: RH STROKE PATIENT EDUCATION Description: See Patient Education module for education specifics  Outcome: Progressing   Problem: RH BOWEL ELIMINATION Goal: RH STG MANAGE BOWEL WITH ASSISTANCE Description: STG Manage Bowel with Assistance. Outcome: Progressing Goal: RH STG MANAGE BOWEL W/MEDICATION W/ASSISTANCE Description: STG Manage Bowel with Medication with Assistance. Outcome: Progressing   Problem: RH BLADDER ELIMINATION Goal: RH STG MANAGE BLADDER WITH ASSISTANCE Description: STG Manage Bladder With Assistance Outcome: Progressing Goal: RH STG MANAGE BLADDER WITH MEDICATION WITH ASSISTANCE Description: STG Manage Bladder With Medication With Assistance. Outcome: Progressing   Problem: RH SKIN INTEGRITY Goal: RH STG SKIN FREE OF INFECTION/BREAKDOWN Outcome: Progressing Goal: RH STG MAINTAIN SKIN INTEGRITY WITH ASSISTANCE Description: STG Maintain Skin Integrity With Assistance. Outcome: Progressing   Problem: RH SAFETY Goal: RH STG ADHERE TO SAFETY PRECAUTIONS W/ASSISTANCE/DEVICE Description: STG Adhere to Safety Precautions With Assistance/Device. Outcome: Progressing Goal: RH STG DECREASED RISK OF FALL WITH ASSISTANCE Description: STG Decreased Risk of Fall With Assistance. Outcome: Progressing   Problem: RH PAIN MANAGEMENT Goal: RH STG PAIN MANAGED AT OR BELOW PT'S PAIN GOAL Outcome: Progressing   Problem: RH KNOWLEDGE DEFICIT Goal: RH STG INCREASE KNOWLEDGE OF DIABETES Outcome: Progressing Goal: RH STG INCREASE KNOWLEDGE OF HYPERTENSION Outcome: Progressing Goal: RH STG INCREASE KNOWLEGDE OF HYPERLIPIDEMIA Outcome: Progressing Goal: RH STG INCREASE KNOWLEDGE OF STROKE PROPHYLAXIS Outcome: Progressing

## 2019-07-03 NOTE — Progress Notes (Signed)
[place] PHYSICAL MEDICINE & REHABILITATION PROGRESS NOTE   Subjective/Complaints:  Good BM yesterday , no abd pain, still wit reduced sensation RUE, asking about D/C on Sat 4/10  ROS: Patient denies CP, SOB, N/V/D    Objective:   No results found. Recent Labs    07/03/19 0517  WBC 5.1  HGB 12.0*  HCT 36.8*  PLT 204   Recent Labs    07/03/19 0517  NA 131*  K 4.0  CL 95*  CO2 26  GLUCOSE 117*  BUN 8  CREATININE 1.14  CALCIUM 9.3    Intake/Output Summary (Last 24 hours) at 07/03/2019 0820 Last data filed at 07/03/2019 0657 Gross per 24 hour  Intake 240 ml  Output 1275 ml  Net -1035 ml     Physical Exam: Vital Signs Blood pressure 122/78, pulse 64, temperature 98 F (36.7 C), resp. rate 18, height 6' (1.829 m), weight 65.7 kg, SpO2 100 %.    General: No acute distress Mood and affect are appropriate Heart: Regular rate and rhythm no rubs murmurs or extra sounds Lungs: Clear to auscultation, breathing unlabored, no rales or wheezes Abdomen: Positive bowel sounds, soft nontender to palpation, nondistended Extremities: No clubbing, cyanosis, or edema Skin: No evidence of breakdown, no evidence of rash  Sensory exam reduced RUE, RLE   HOH Motor: RUE: Shoulder adduction 3-/5, elbow flexion/extension 3+/5, handgrip 3-/5  LUE: 4+/5 proximal distal LLE: 4+/5 proximal distal RLE: 2-/5 proximal to distal  Assessment/Plan: 1. Functional deficits secondary to CVA L MCA and subdural  which require 3+ hours per day of interdisciplinary therapy in a comprehensive inpatient rehab setting.  Physiatrist is providing close team supervision and 24 hour management of active medical problems listed below.  Physiatrist and rehab team continue to assess barriers to discharge/monitor patient progress toward functional and medical goals  Care Tool:  Bathing  Bathing activity did not occur: Refused Body parts bathed by patient: Face, Right arm, Chest, Abdomen, Front perineal  area, Right upper leg, Left upper leg, Buttocks, Left lower leg, Left arm   Body parts bathed by helper: Right lower leg Body parts n/a: Front perineal area, Buttocks, Right lower leg, Left lower leg   Bathing assist Assist Level: Minimal Assistance - Patient > 75%     Upper Body Dressing/Undressing Upper body dressing   What is the patient wearing?: Pull over shirt    Upper body assist Assist Level: Minimal Assistance - Patient > 75%    Lower Body Dressing/Undressing Lower body dressing      What is the patient wearing?: Incontinence brief     Lower body assist Assist for lower body dressing: Maximal Assistance - Patient 25 - 49%     Toileting Toileting Toileting Activity did not occur (Clothing management and hygiene only): Refused  Toileting assist Assist for toileting: Maximal Assistance - Patient 25 - 49%     Transfers Chair/bed transfer  Transfers assist     Chair/bed transfer assist level: Moderate Assistance - Patient 50 - 74%     Locomotion Ambulation   Ambulation assist   Ambulation activity did not occur: Safety/medical concerns  Assist level: Moderate Assistance - Patient 50 - 74% Assistive device: Walker-rolling Max distance: 50   Walk 10 feet activity   Assist  Walk 10 feet activity did not occur: Safety/medical concerns  Assist level: Moderate Assistance - Patient - 50 - 74% Assistive device: Walker-rolling   Walk 50 feet activity   Assist Walk 50 feet with 2 turns  activity did not occur: Safety/medical concerns  Assist level: Moderate Assistance - Patient - 50 - 74% Assistive device: Walker-rolling    Walk 150 feet activity   Assist Walk 150 feet activity did not occur: Safety/medical concerns         Walk 10 feet on uneven surface  activity   Assist Walk 10 feet on uneven surfaces activity did not occur: Safety/medical concerns         Wheelchair     Assist Will patient use wheelchair at discharge?: Yes(LT  goals set)      Wheelchair assist level: Supervision/Verbal cueing Max wheelchair distance: 50    Wheelchair 50 feet with 2 turns activity    Assist        Assist Level: Supervision/Verbal cueing   Wheelchair 150 feet activity     Assist      Assist Level: Maximal Assistance - Patient 25 - 49%   Blood pressure 122/78, pulse 64, temperature 98 F (36.7 C), resp. rate 18, height 6' (1.829 m), weight 65.7 kg, SpO2 100 %.  Medical Problem List and Plan: 1.  Left parafalcine subdural hematoma and subarachnoid hemorrhage, Left MCA M3 branch embolic stroke   Continue CIR PT, OT. SLP 2.  Antithrombotics: -DVT/anticoagulation:DC Heparin   Mechanical: Sequential compression devices, below knee Bilateral lower extremities per Neuro Dr Leonie Man, ok to resume Xarelto              -antiplatelet therapy: resuming Plavix  3. Pain Management: N/a 4. Mood: LCSW to follow for evaluation and support.              -antipsychotic agents: N/A 5. Neuropsych: This patient is capable of making decisions on his own behalf. 6. Skin/Wound Care: Routine pressure relief measures.  7. Fluids/Electrolytes/Nutrition: Monitor I/Os. Continue Vitamin B12 supplement 8. Acute on chronic CHF: On Coreg , lipitor, imdur and Lasix.  Monitor for signs of overload--monitor weights daily.  Filed Weights   07/01/19 0426 07/02/19 0445 07/03/19 0452  Weight: 64.9 kg 65.5 kg 65.7 kg   Weights stable 4/5 9. CAD/CAF/ s/p AICD: Continues imdur, Coreg and lipitor. Monitor HR tid.  Vitals:   07/02/19 1939 07/03/19 0452  BP: 101/69 122/78  Pulse: (!) 57 64  Resp: 16 18  Temp:  98 F (36.7 C)  SpO2: 98% 100%   Controlled on 4/5 10. Hypokalemia: 4.0 on 4/5  Resolved  11. ABLA: Resolved 12. Macular degeneration: Legally blind.  Needs magnifying glass to read normal print  13. Sleep disturbance: Trazodone 25mg  HS PRN 14.  Post stroke dysphagia-D3 thins liq - advance per SLP- still requires supervision  15. Heat  rash: Has benadryl prn; used at 2am last night.  Continue Sarna lotion and Triamcinolone ointment BID.  16. Aphasia- mainly expressive mild, SLP  17.  Acute lower UTI- still a little burning, pt with multiple species on culture, afebrile    Completed  18.  Hyponatremia- asymptomatic LAsix, , check serum and urine osmolality today - normal serum osmo with dliute urine, likely not  SIADH   -recheck labs 4/5 19.  Chronic vertigo since childhood pt does not recall inner ear infection used meclizine at home , may take prn 20. Constipation: improved after sorbitol, resume Miralax today    LOS: 19 days A FACE TO [place] E Miray Mancino 07/03/2019, 8:20 AM

## 2019-07-04 ENCOUNTER — Inpatient Hospital Stay (HOSPITAL_COMMUNITY): Payer: Medicare Other | Admitting: Physical Therapy

## 2019-07-04 ENCOUNTER — Inpatient Hospital Stay (HOSPITAL_COMMUNITY): Payer: Medicare Other

## 2019-07-04 ENCOUNTER — Inpatient Hospital Stay (HOSPITAL_COMMUNITY): Payer: Medicare Other | Admitting: Speech Pathology

## 2019-07-04 NOTE — Progress Notes (Signed)
Physical Therapy Session Note  Patient Details  Name: Mark Clayton MRN: [id number] Date of Birth: Oct 08, 1940  Today's Date: 07/04/2019 PT Individual Time:  -      Short Term Goals: Week 3:  PT Short Term Goal 1 (Week 3): STG=LTG due to ELOS  Skilled Therapeutic Interventions/Progress Updates: Pt missed 60 min skilled PT due to refusal. Pt states have had bowel problems and per nsg noted received suppository this am. Pt refusing therapy as states too tired to participate. PTA educated pt on importance of participating in therapy especially as anticipated d/c this weekend. PTA provided pt with rest of day's schedule and encouraged pt to participate in later sessions.      Therapy Documentation Precautions:  Precautions Precautions: Fall Precaution Comments: R hemiparesis Restrictions Weight Bearing Restrictions: No   Therapy/Group: Individual Therapy  Roen Macgowan  Dauntae Derusha, PTA  07/04/2019, 7:58 AM

## 2019-07-04 NOTE — Progress Notes (Signed)
Reported In shift report that pt had a bowel movement on 4/4, however according to documentation, pt hasn't had a BM since 3/28 pt stated, however 3/18 documented by another tech. Will give pt a suppository  this am

## 2019-07-04 NOTE — Plan of Care (Signed)
  Problem: Consults Goal: RH STROKE PATIENT EDUCATION Description: See Patient Education module for education specifics  Outcome: Progressing   Problem: RH BOWEL ELIMINATION Goal: RH STG MANAGE BOWEL WITH ASSISTANCE Description: STG Manage Bowel with  min Assistance. Outcome: Progressing Goal: RH STG MANAGE BOWEL W/MEDICATION W/ASSISTANCE Description: STG Manage Bowel with Medication with  min Assistance. Outcome: Progressing   Problem: RH BLADDER ELIMINATION Goal: RH STG MANAGE BLADDER WITH ASSISTANCE Description: STG Manage Bladder With  min Assistance Outcome: Progressing   Problem: RH SKIN INTEGRITY Goal: RH STG SKIN FREE OF INFECTION/BREAKDOWN Description: No breakdown or infection at discharge Outcome: Progressing Goal: RH STG MAINTAIN SKIN INTEGRITY WITH ASSISTANCE Description: STG Maintain Skin Integrity With  min Assistance. Outcome: Progressing   Problem: RH SAFETY Goal: RH STG ADHERE TO SAFETY PRECAUTIONS W/ASSISTANCE/DEVICE Description: STG Adhere to Safety Precautions With  min Assistance/Device. Outcome: Progressing Goal: RH STG DECREASED RISK OF FALL WITH ASSISTANCE Description: STG Decreased Risk of Fall With min Assistance. Outcome: Progressing   Problem: RH PAIN MANAGEMENT Goal: RH STG PAIN MANAGED AT OR BELOW PT'S PAIN GOAL Description: Less than 3 Outcome: Progressing   Problem: RH KNOWLEDGE DEFICIT Goal: RH STG INCREASE KNOWLEDGE OF HYPERTENSION Description: Increase knowledge prior to discharge of medications  Outcome: Progressing Goal: RH STG INCREASE KNOWLEGDE OF HYPERLIPIDEMIA Description: Increase knowledge of medications prior to discharge. Outcome: Progressing Goal: RH STG INCREASE KNOWLEDGE OF STROKE PROPHYLAXIS Description: Increase knowledge of signs and symptoms of stroke prevention prior to discharge. Outcome: Progressing

## 2019-07-04 NOTE — Progress Notes (Signed)
Occupational Therapy Session Note  Patient Details  Name: Mark Clayton MRN: [id number] Date of Birth: 01-Sep-1940  Today's Date: 07/04/2019 OT Individual Time: 0215-0330 OT Individual Time Calculation (min): 75 min    Short Term Goals: Week 3:  OT Short Term Goal 1 (Week 3): STG= LTG secondary to ELOS  Skilled Therapeutic Interventions/Progress Updates:  Pt received supine in bed initially declining OT intervention d/t fatigue and having a "rough day" wanting to take the "day off", however with MAX encouragment pt agreeable. Pt transition from supine>EOB with supervision needing MAX A for LB Dressing from EOB. Pt continues to self- limit stating "I already know I can't do that" despite education on trying various techniques to determine what pt can do. Pt completed stand pivot transfer to w/c to pts L side with RW and MODA. Pt continues to be unable to advance RLE without manual facilitation. Pt transported to ADL apartment with total A from w/c although pt able to self propel w/c into hallway ~ 7 ft with verbal cues for hand placement. Pt completed walkin shower transfer to 3n1 with MAX A. Pt unable to step over lip of shower, therefore downgraded task and set- up w/c close enough to lip of shower so pt could sit<>Stand with feet already inside shower. MAX A to stand pivot to seat with RW and manual facilitation to pivot RLE. Noted poor standing balance this session with pt needing increased support to maintain midline. Education provided on amount of assist OTA providing with pt stating he feels like he will have adequate help at home to support him. Provided education on completing sponge baths at home initially until [facility] comes out pending families comfort with assisting with MAX A shower transfers at home. Pt additionally completed  x2 standing trials to facilitate lateral weight shifts as precursor to advancing RLE during functional mobility. Trailed use of mirror as visual feedback with pt stating  the mirror "makes his eyes go crazy."Pt required MAX manual facilitation at R hip to advance LE forward. Pt transported back to room in w/c with total A and required MOD A to return to supine to elevate BLEs back to bed. Pt left supine in bed with bed alarm activated and all needs within reach.   Therapy Documentation Precautions:  Precautions Precautions: Fall Precaution Comments: R hemiparesis Restrictions Weight Bearing Restrictions: No General:   Vital Signs: Therapy Vitals Temp: 98.6 F (37 C) Temp Source: Oral Pulse Rate: 74 Resp: 19 BP: 102/64 Patient Position (if appropriate): Lying Oxygen Therapy SpO2: 100 % O2 Device: Room Air Pain: Pt reports no pain during session.   Therapy/Group: Individual Therapy  [name] 07/04/2019, 3:46 PM

## 2019-07-04 NOTE — Progress Notes (Signed)
Speech Language Pathology Daily Session Note  Patient Details  Name: Mark Clayton MRN: [id number] Date of Birth: [birth year]  Today's Date: 07/04/2019 SLP Individual Time: [id number] SLP Individual Time Calculation (min): 43 min  Short Term Goals: Week 3: SLP Short Term Goal 1 (Week 3): STG=LTG due to remaining LOS  Skilled Therapeutic Interventions: Pt was seen for skilled ST targeting cognitive goals. SLP facilitated session with functional conversastion regarding d/c this weekend. Pt recalled refusing earlier PT session and his agreement with PT to participate in OT and ST as much as possible today. He remained agreeable to participate with SLP, although due to frustration with task later in session ultimately refused last 17 minutes of session. Pt with increased ability to anticipate equipment he may need at d/c and verbally problem solve modifications to his home to accommodate for wheelchair with Min A question cues. Pt engaged in a novel basic card task (11 up) during which he required overall Supervision A for recall of rules and Min A for error awareness (pt could correct errors with only Supervision A verbal question cues). Pt left laying in bed with alarm set and needs within reach. Continue per current plan of care.         Pain Pain Assessment Pain Scale: Faces Faces Pain Scale: No hurt  Therapy/Group: Individual Therapy  Arbutus Leas 07/04/2019, 7:07 AM

## 2019-07-04 NOTE — Progress Notes (Signed)
[place] PHYSICAL MEDICINE & REHABILITATION PROGRESS NOTE   Subjective/Complaints:  Lg BM after supp this am , no abd pain   ROS: Patient denies CP, SOB, N/V/D    Objective:   No results found. Recent Labs    07/03/19 0517  WBC 5.1  HGB 12.0*  HCT 36.8*  PLT 204   Recent Labs    07/03/19 0517  NA 131*  K 4.0  CL 95*  CO2 26  GLUCOSE 117*  BUN 8  CREATININE 1.14  CALCIUM 9.3    Intake/Output Summary (Last 24 hours) at 07/04/2019 0748 Last data filed at 07/04/2019 0300 Gross per 24 hour  Intake 544 ml  Output 475 ml  Net 69 ml     Physical Exam: Vital Signs Blood pressure 119/81, pulse 75, temperature 97.9 F (36.6 C), resp. rate 16, height 6' (1.829 m), weight 65.9 kg, SpO2 98 %.    General: No acute distress Mood and affect are appropriate Heart: Regular rate and rhythm no rubs murmurs or extra sounds Lungs: Clear to auscultation, breathing unlabored, no rales or wheezes Abdomen: Positive bowel sounds, soft nontender to palpation, nondistended Extremities: No clubbing, cyanosis, or edema Skin: No evidence of breakdown, no evidence of rash Neurologic  HOH Motor: RUE: Shoulder adduction 3-/5, elbow flexion/extension 3+/5, handgrip 3-/5  LUE: 4+/5 proximal distal LLE: 4+/5 proximal distal RLE: 2-/5 proximal to distal  Assessment/Plan: 1. Functional deficits secondary to CVA L MCA and subdural  which require 3+ hours per day of interdisciplinary therapy in a comprehensive inpatient rehab setting.  Physiatrist is providing close team supervision and 24 hour management of active medical problems listed below.  Physiatrist and rehab team continue to assess barriers to discharge/monitor patient progress toward functional and medical goals  Care Tool:  Bathing  Bathing activity did not occur: Refused Body parts bathed by patient: Face, Right arm, Chest, Abdomen, Front perineal area, Right upper leg, Left upper leg, Buttocks, Left lower leg, Left arm   Body parts bathed by helper: Right lower leg Body parts n/a: Front perineal area, Buttocks, Right lower leg, Left lower leg   Bathing assist Assist Level: Minimal Assistance - Patient > 75%     Upper Body Dressing/Undressing Upper body dressing   What is the patient wearing?: Pull over shirt    Upper body assist Assist Level: Minimal Assistance - Patient > 75%    Lower Body Dressing/Undressing Lower body dressing      What is the patient wearing?: Pants     Lower body assist Assist for lower body dressing: Maximal Assistance - Patient 25 - 49%     Toileting Toileting Toileting Activity did not occur Landscape architect and hygiene only): Refused  Toileting assist Assist for toileting: Maximal Assistance - Patient 25 - 49%     Transfers Chair/bed transfer  Transfers assist     Chair/bed transfer assist level: Moderate Assistance - Patient 50 - 74%     Locomotion Ambulation   Ambulation assist   Ambulation activity did not occur: Safety/medical concerns  Assist level: Moderate Assistance - Patient 50 - 74% Assistive device: Walker-rolling Max distance: 50   Walk 10 feet activity   Assist  Walk 10 feet activity did not occur: Safety/medical concerns  Assist level: Moderate Assistance - Patient - 50 - 74% Assistive device: Walker-rolling   Walk 50 feet activity   Assist Walk 50 feet with 2 turns activity did not occur: Safety/medical concerns  Assist level: Moderate Assistance - Patient - 80 -  74% Assistive device: Walker-rolling    Walk 150 feet activity   Assist Walk 150 feet activity did not occur: Safety/medical concerns         Walk 10 feet on uneven surface  activity   Assist Walk 10 feet on uneven surfaces activity did not occur: Safety/medical concerns         Wheelchair     Assist Will patient use wheelchair at discharge?: Yes(LT goals set)      Wheelchair assist level: Supervision/Verbal cueing Max wheelchair  distance: 50    Wheelchair 50 feet with 2 turns activity    Assist        Assist Level: Supervision/Verbal cueing   Wheelchair 150 feet activity     Assist      Assist Level: Maximal Assistance - Patient 25 - 49%   Blood pressure 119/81, pulse 75, temperature 97.9 F (36.6 C), resp. rate 16, height 6' (1.829 m), weight 65.9 kg, SpO2 98 %.  Medical Problem List and Plan: 1.  Left parafalcine subdural hematoma and subarachnoid hemorrhage, Left MCA M3 branch embolic stroke   Continue CIR PT, OT. SLP 2.  Antithrombotics: -DVT/anticoagulation:DC Heparin   Mechanical: Sequential compression devices, below knee Bilateral lower extremities per Neuro Dr Leonie Man, ok to resume Xarelto              -antiplatelet therapy: resuming Plavix  3. Pain Management: N/a 4. Mood: LCSW to follow for evaluation and support.              -antipsychotic agents: N/A 5. Neuropsych: This patient is capable of making decisions on his own behalf. 6. Skin/Wound Care: Routine pressure relief measures.  7. Fluids/Electrolytes/Nutrition: Monitor I/Os. Continue Vitamin B12 supplement 8. Acute on chronic CHF: On Coreg , lipitor, imdur and Lasix.  Monitor for signs of overload--monitor weights daily.  Filed Weights   07/02/19 0445 07/03/19 0452 07/04/19 0302  Weight: 65.5 kg 65.7 kg 65.9 kg   Weights stable 4/6 9. CAD/CAF/ s/p AICD: Continues imdur, Coreg and lipitor. Monitor HR tid.  Vitals:   07/03/19 1932 07/04/19 0302  BP: 117/75 119/81  Pulse: 69 75  Resp: 20 16  Temp: 97.6 F (36.4 C) 97.9 F (36.6 C)  SpO2: 99% 98%   Controlled on 4/6 10. Hypokalemia: 4.0 on 4/6  Resolved  11. ABLA: Resolved 12. Macular degeneration: Legally blind.  Needs magnifying glass to read normal print  13. Sleep disturbance: Trazodone 25mg  HS PRN 14.  Post stroke dysphagia-D3 thins liq - advance per SLP- still requires supervision  15. Heat rash: Has benadryl prn; used at 2am last night.  Continue Sarna lotion  and Triamcinolone ointment BID.  16. Aphasia- mainly expressive mild, SLP  17.  Acute lower UTI- still a little burning, pt with multiple species on culture, afebrile    Completed  18.  Hyponatremia- asymptomaticimproving may be due to  Lasix, , check serum and urine osmolality today - normal serum osmo with dliute urine, likely not  SIADH   -recheck labs 4/5 19.  Chronic vertigo since childhood pt does not recall inner ear infection used meclizine at home , may take prn 20. Constipation: improved after sorbitol, resume Miralax today    LOS: 20 days A FACE TO [place] E Deavin Forst 07/04/2019, 7:48 AM

## 2019-07-04 NOTE — Progress Notes (Signed)
Pt was observed with eyes closed most of the night, only waking to use the urinal . No s/s of distress or pain noted.

## 2019-07-05 ENCOUNTER — Inpatient Hospital Stay (HOSPITAL_COMMUNITY): Payer: Medicare Other

## 2019-07-05 ENCOUNTER — Ambulatory Visit (HOSPITAL_COMMUNITY): Payer: Medicare Other | Admitting: *Deleted

## 2019-07-05 ENCOUNTER — Inpatient Hospital Stay (HOSPITAL_COMMUNITY): Payer: Medicare Other | Admitting: Speech Pathology

## 2019-07-05 MED ORDER — RIVAROXABAN 20 MG PO TABS: 20.0000 mg | ORAL_TABLET | Freq: Every day | ORAL | 0 refills | Status: DC

## 2019-07-05 MED FILL — XARELTO 20 MG TABLET: 20 | 30 days supply | Qty: 30 | Fill #0

## 2019-07-05 NOTE — Progress Notes (Signed)
Physical Therapy Session Note  Patient Details  Name: Mark Clayton MRN: [id number] Date of Birth: [birth year]  Today's Date: 07/05/2019 PT Individual Time: 1030-1100 PT Individual Time Calculation (min): 30 min   Short Term Goals: Week 3:  PT Short Term Goal 1 (Week 3): STG=LTG due to ELOS  Skilled Therapeutic Interventions/Progress Updates:   Pt received supine in bed and agreeable to PT. Supine>sit transfer with supervision assist and min cues for awareness of RLE. Sit<>stand from EOB to don pants with UE on rail. Squat pivot transfer to [facility] with mod assist due to poor RLE placement and HS activation with transfer.   Pt transported to entrance of hospital in [facility]. WC mobility over unlevel surface x 18f +532fwith min assist on unlevel grade to improve positioning and WC and prevent loss of progress up hill. 1 prolonged rest break due to reported  SOB.   [facility] returned to [facility] and [facility], call bell in reach and all needs met.        Therapy Documentation Precautions:  Precautions Precautions: Fall Precaution Comments: R hemiparesis Restrictions Weight Bearing Restrictions: No    Pain: denies   Therapy/Group: Individual Therapy  AuLorie Phenix/09/2019, 11:00 AM

## 2019-07-05 NOTE — Progress Notes (Signed)
[place] PHYSICAL MEDICINE & REHABILITATION PROGRESS NOTE   Subjective/Complaints:  Patient without new issues overnight.  Had a good breakfast.  We discussed bracing issues with right lower extremity.  ROS: Patient denies CP, SOB, N/V/D    Objective:   No results found. Recent Labs    07/03/19 0517  WBC 5.1  HGB 12.0*  HCT 36.8*  PLT 204   Recent Labs    07/03/19 0517  NA 131*  K 4.0  CL 95*  CO2 26  GLUCOSE 117*  BUN 8  CREATININE 1.14  CALCIUM 9.3    Intake/Output Summary (Last 24 hours) at 07/05/2019 0839 Last data filed at 07/05/2019 0700 Gross per 24 hour  Intake 118 ml  Output 1175 ml  Net -1057 ml     Physical Exam: Vital Signs Blood pressure 120/72, pulse 80, temperature 98 F (36.7 C), resp. rate 18, height 6' (1.829 m), weight 64.5 kg, SpO2 100 %.  General: No acute distress Mood and affect are appropriate Heart: Regular rate and rhythm no rubs murmurs or extra sounds Lungs: Clear to auscultation, breathing unlabored, no rales or wheezes Abdomen: Positive bowel sounds, soft nontender to palpation, nondistended Extremities: No clubbing, cyanosis, or edema Skin: No evidence of breakdown, no evidence of rash HOH Motor: RUE: Shoulder adduction 3-/5, elbow flexion/extension 3+/5, handgrip 3-/5  LUE: 4+/5 proximal distal LLE: 4+/5 proximal distal RLE: 2-/5 proximal to distal  Assessment/Plan: 1. Functional deficits secondary to CVA L MCA and subdural  which require 3+ hours per day of interdisciplinary therapy in a comprehensive inpatient rehab setting.  Physiatrist is providing close team supervision and 24 hour management of active medical problems listed below.  Physiatrist and rehab team continue to assess barriers to discharge/monitor patient progress toward functional and medical goals  Care Tool:  Bathing  Bathing activity did not occur: Refused Body parts bathed by patient: Face, Right arm, Chest, Abdomen, Front perineal area, Right  upper leg, Left upper leg, Buttocks, Left lower leg, Left arm   Body parts bathed by helper: Right lower leg Body parts n/a: Front perineal area, Buttocks, Right lower leg, Left lower leg   Bathing assist Assist Level: Minimal Assistance - Patient > 75%     Upper Body Dressing/Undressing Upper body dressing   What is the patient wearing?: Pull over shirt    Upper body assist Assist Level: Minimal Assistance - Patient > 75%    Lower Body Dressing/Undressing Lower body dressing      What is the patient wearing?: Pants     Lower body assist Assist for lower body dressing: Maximal Assistance - Patient 25 - 49%     Toileting Toileting Toileting Activity did not occur Landscape architect and hygiene only): Refused  Toileting assist Assist for toileting: Maximal Assistance - Patient 25 - 49%     Transfers Chair/bed transfer  Transfers assist     Chair/bed transfer assist level: Moderate Assistance - Patient 50 - 74%     Locomotion Ambulation   Ambulation assist   Ambulation activity did not occur: Safety/medical concerns  Assist level: Moderate Assistance - Patient 50 - 74% Assistive device: Walker-rolling Max distance: 50   Walk 10 feet activity   Assist  Walk 10 feet activity did not occur: Safety/medical concerns  Assist level: Moderate Assistance - Patient - 50 - 74% Assistive device: Walker-rolling   Walk 50 feet activity   Assist Walk 50 feet with 2 turns activity did not occur: Safety/medical concerns  Assist level: Moderate  Assistance - Patient - 50 - 74% Assistive device: Walker-rolling    Walk 150 feet activity   Assist Walk 150 feet activity did not occur: Safety/medical concerns         Walk 10 feet on uneven surface  activity   Assist Walk 10 feet on uneven surfaces activity did not occur: Safety/medical concerns         Wheelchair     Assist Will patient use wheelchair at discharge?: Yes(LT goals set)       Wheelchair assist level: Supervision/Verbal cueing Max wheelchair distance: 50    Wheelchair 50 feet with 2 turns activity    Assist        Assist Level: Supervision/Verbal cueing   Wheelchair 150 feet activity     Assist      Assist Level: Maximal Assistance - Patient 25 - 49%   Blood pressure 120/72, pulse 80, temperature 98 F (36.7 C), resp. rate 18, height 6' (1.829 m), weight 64.5 kg, SpO2 100 %.  Medical Problem List and Plan: 1.  Left parafalcine subdural hematoma and subarachnoid hemorrhage, Left MCA M3 branch embolic stroke   Continue CIR PT, OT. SLP team conf today  2.  Antithrombotics: -DVT/anticoagulation:DC Heparin   Mechanical: Sequential compression devices, below knee Bilateral lower extremities per Neuro Dr Leonie Man, ok to resume Xarelto              -antiplatelet therapy: resuming Plavix  3. Pain Management: N/a 4. Mood: LCSW to follow for evaluation and support.              -antipsychotic agents: N/A 5. Neuropsych: This patient is capable of making decisions on his own behalf. 6. Skin/Wound Care: Routine pressure relief measures.  7. Fluids/Electrolytes/Nutrition: Monitor I/Os. Continue Vitamin B12 supplement 8. Acute on chronic CHF: On Coreg , lipitor, imdur and Lasix.  Monitor for signs of overload--monitor weights daily.  Filed Weights   07/03/19 0452 07/04/19 0302 07/05/19 0500  Weight: 65.7 kg 65.9 kg 64.5 kg   Weights stable 4/7 9. CAD/CAF/ s/p AICD: Continues imdur, Coreg and lipitor. Monitor HR tid.  Vitals:   07/04/19 1926 07/05/19 0431  BP: (!) 91/58 120/72  Pulse: (!) 57 80  Resp: 16 18  Temp: 97.6 F (36.4 C) 98 F (36.7 C)  SpO2: 97% 100%   Controlled on 4/7 10. Hypokalemia: 4.0 on 4/6  Resolved  11. ABLA: Resolved 12. Macular degeneration: Legally blind.  Needs magnifying glass to read normal print  13. Sleep disturbance: Trazodone 25mg  HS PRN 14.  Post stroke dysphagia-D3 thins liq - advance per SLP- no sign of  aspiration   15. Heat rash: Has benadryl prn; used at 2am last night.  Continue Sarna lotion and Triamcinolone ointment BID.  16. Aphasia- mainly expressive mild, SLP  17.  Acute lower UTI- still a little burning, pt with multiple species on culture, afebrile    Completed  18.  Hyponatremia- asymptomaticimproving may be due to  Lasix, , check serum and urine osmolality today - normal serum osmo with dliute urine, likely not  SIADH   -recheck labs 4/5 19.  Chronic vertigo since childhood pt does not recall inner ear infection used meclizine at home , may take prn 20. Constipation: improved after sorbitol, resume Miralax today    LOS: 21 days A FACE TO [place] E Kelyn Koskela 07/05/2019, 8:39 AM

## 2019-07-05 NOTE — Patient Care Conference (Signed)
Inpatient RehabilitationTeam Conference and Plan of Care Update Date: 07/05/2019   Time: 10:15 AM    Patient Name: Mark Clayton      Medical Record Number: [id number]  Date of Birth: 03/21/1941 Sex: Male         Room/Bed: 4M09C/4M09C-01 Payor Info: Payor: [employer] MEDICARE / Plan: UHC MEDICARE / Product Type: *No Product type* /    Admit Date/Time:  06/14/2019  5:17 PM  Primary Diagnosis:  Embolic stroke (HCC)  Patient Active Problem List   Diagnosis Date Noted  . Acute lower UTI   . Dysphagia, post-stroke   . Hypokalemia   . Acute on chronic combined systolic and diastolic congestive heart failure (HCC)   . Embolic stroke (HCC) 06/14/2019  . ICH (intracerebral hemorrhage) (HCC) 06/06/2019  . Dysarthria   . Tinnitus 08/26/2018  . Essential hypertension 01/21/2018  . Coronary artery disease involving native heart with angina pectoris (HCC) 10/19/2017  . Persistent atrial fibrillation   . Routine general medical examination at a health care facility 01/10/2016  . Unstable angina (HCC) 09/20/2014  . Ischemic cardiomyopathy   . Personal history of colon cancer   . Implantable cardioverter-defibrillator (ICD) in situ 03/09/2012  . Microcytic anemia 01/12/2011  . Cardiac defibrillator  MDT VVI 12/26/2010  . Chronic systolic heart failure (HCC) 11/26/2008    Expected Discharge Date: Expected Discharge Date: 07/08/19  Team Members Present: Physician leading conference: Dr. Andrew Kirsteins Care Coodinator Present: Genie , RN, MSN;Deborah Sharpe, RN, BSN, CRRN Nurse Present: Akivia Warren, LPN PT Present: Austin Tucker, PT OT Present: Katie Bradsher, OT SLP Present: [name] Smith, CF-SLP PPS Coordinator present : Becky Windsor, PT     Current Status/Progress Goal Weekly Team Focus  Bowel/Bladder   Incontinent bladder, continent bowel, lbm 07/04/19  obtain bladder continence and maintain regular BM  QS/PRN toileting   Swallow/Nutrition/ Hydration   Dys 3, thin  liquids, intermittent supervision  Supervision A least restrictive diet  tolerance Dys 3, thin, encouragement to do regular trials, independence with use of strategies   ADL's   Mod A squat pivot BSC transfers, Min-Mod A sit<stand using RW during toileting, Max A for bathing, toileting, and LB self care tasks. Min A for UB. Pt exhibits self limiting behaviors during tx which affects progress  Min A overall  NMR, functional transfers, ADL retraining, pt/family education   Mobility   min/modA bed mobilty, minA STS, minA stand pivot, minA squat pivot, mod to maxA gait up to 30 ft with RW, supervision w/c mobilty  minA overall  balance, transfers, gait   Communication   Mod I  Mod I  goals met   Safety/Cognition/ Behavioral Observations  Supervision A-Min A attention, Supervison-Min A basic problem solving and error awareness  Supervision A  basic familiar problem solving, error awareness, recall   Pain   C/O pain in legs, prn tylenol  pain score <3  QS/PRN pain assessments   Skin   Patient c/o itching on back, not evidence of breakdown  free from itching, free of skin breakdown  QS/PRN skin assessment    Rehab Goals Patient on target to meet rehab goals: Yes *See Care Plan and progress notes for long and short-term goals.     Barriers to Discharge  Current Status/Progress Possible Resolutions Date Resolved   Nursing                  PT                      OT                  SLP                SW Decreased caregiver support Main level living with access to B+B, no steps to entry and family around to assist Daughter and sister to complete family education for discharge          Discharge Planning/Teaching Needs:  Home with daughter and grand-daughter  TBD-Medications, transfers, toileting, etc.   Team Discussion: MD confused, aphasic, R hemi, constipation, BP low/controlled, Na+ low/improving.  RN inc bladder, cont bowel, leg pain, tylenol given.  OT mod pivot BSC, min/mod sit to  stand, mod/max bathing, min/mod shower transfer, min A goals.  PT min/mod bed, min pivot from w/c, min sit to stand, mod/max amb, trialing AFO, downgraded amb goals to mod A.  SLP D3thins, min basic prob solving, goals S/min A, fam ed tomorrow.  Has Dtr, sister, GDtr to assist at DC.   Revisions to Treatment Plan: N/A     Medical Summary Current Status: Blood pressure control improved, tolerating anticoagulation Weekly Focus/Goal: Continue working on communication swallowing and right sided neuromuscular reeducation  Barriers to Discharge: Medical stability   Possible Resolutions to Barriers: Family training, patient education discharge, estimate pharmacy expenses for Xarelto   Continued Need for Acute Rehabilitation Level of Care: The patient requires daily medical management by a physician with specialized training in physical medicine and rehabilitation for the following reasons: Direction of a multidisciplinary physical rehabilitation program to maximize functional independence : Yes Medical management of patient stability for increased activity during participation in an intensive rehabilitation regime.: Yes Analysis of laboratory values and/or radiology reports with any subsequent need for medication adjustment and/or medical intervention. : Yes   I attest that I was present, lead the team conference, and concur with the assessment and plan of the team.   Jodell Cipro M 07/05/2019, 4:12 PM   Team conference was held via web/ teleconference due to [place] - [phone]

## 2019-07-05 NOTE — Progress Notes (Signed)
Recreational Therapy Session Note  Patient Details  Name: Mark Clayton MRN: [id number] Date of Birth: 1941/03/20 Today's Date: 07/05/2019 Time:  1030-11 Pain: no c/o Skilled Therapeutic Interventions/Progress Updates: Session focused on outdoor w/c mobility.  Pt propelled w/c on uneven surfaces with min assist ~100', Instructional cues for technique.  Pt required rest break due to SOB  Therapy/Group: Co-Treatment  Hazel Leveille 07/05/2019, 3:16 PM

## 2019-07-05 NOTE — Progress Notes (Signed)
Occupational Therapy Session Note  Patient Details  Name: Mark Clayton MRN: [id number] Date of Birth: [birth year]  Today's Date: 07/05/2019 OT Individual Time: 0900-1000 OT Individual Time Calculation (min): 60 min    Short Term Goals: Week 3:  OT Short Term Goal 1 (Week 3): STG= LTG secondary to ELOS  Skilled Therapeutic Interventions/Progress Updates:  Pt received supine in bed reporting having a better day and agreeable to OT intervention. Pt happy to report that pts son-in-law called and reminded pt of tub shower that pt can use at home as walkin shower transfer performed yesterday was difficult. Pt completed bed mobility with supervision using bed rails and slightly elevated HOB. Pt required MAX A for LB dressing EOB and Min A for sit<>stand with RW to pull pants up to waistline. MOD A for squat pivot transfer to w/c to pts L side. Pt transported to ADL apartment in w/c with total A. Pt completed stand pivot transfer to TTB with MOD A. Pt required assist to lift RLE into tub but reports feeling much better about that transfer vs walk in shower transfer. Pt transported back to room in similar fashion as previously indicated. Pt completed seated grooming tasks with supervision with pt able to locate all needed ADL items on counter. Assisted pt with washing hair at sink with pt able to wash UB and complete oral care with set- up assist. Pt completed squat pivot transfer back to bed with MOD A towards pts L side. MOD A to return to supine to elevate BLEs back to bed. Pt left supine in bed with all needs within reach and bed alarm activated.   Therapy Documentation Precautions:  Precautions Precautions: Fall Precaution Comments: R hemiparesis Restrictions Weight Bearing Restrictions: No General:   Vital Signs:   Pain: Pt reports mild pain in RLE during transfer; provided seated rest break and inc reased time as pain mgmt strategy.   Therapy/Group: Individual Therapy  [name] 07/05/2019, 10:23 AM

## 2019-07-05 NOTE — Progress Notes (Signed)
Speech Language Pathology Daily Session Note  Patient Details  Name: Mark Clayton MRN: [id number] Date of Birth: 06/19/1940  Today's Date: 07/05/2019 SLP Individual Time: [id number] SLP Individual Time Calculation (min): 57 min  Short Term Goals: Week 3: SLP Short Term Goal 1 (Week 3): STG=LTG due to remaining LOS  Skilled Therapeutic Interventions: Pt was seen for skilled ST targeting cognitive goals. Pt reported frustration with food coming chopped, therefore SLP suggested we attempt regular texture trial to assess potential for advancement but pt repeatedly declined opportunity despite encouragement. Pt able to recall general events of PT and OT sessions earlier today with Min A questions cues. Mod A verbal cues required during delayed recall task related to schedule. Pt recalled functions of ~75% medications when provided with the name. He also participated in convergent naming tasks, during which he was Mod I for word finding but required Supervision A verbal cues for redirection to task, and Min faded to Supervision A for use of a rehearsal/repetition strategy for working memory. Pt left laying in bed with alarm set and needs within reach. Continue per current plan of care.        Pain Pain Assessment Pain Scale: 0-10 Pain Score: 0-No pain  Therapy/Group: Individual Therapy  Arbutus Leas 07/05/2019, 7:07 AM

## 2019-07-05 NOTE — Progress Notes (Signed)
Occupational Therapy Session Note  Patient Details  Name: Mark Clayton MRN: [id number] Date of Birth: Jan 07, 1941  Today's Date: 07/05/2019 OT Individual Time: 0300-0330 OT Individual Time Calculation (min): 30 min    Short Term Goals: Week 3:  OT Short Term Goal 1 (Week 3): STG= LTG secondary to ELOS  Skilled Therapeutic Interventions/Progress Updates:  Pt received supine in bed reporting pain in L leg, however pt reports already taken pain meds. Provided increased rest breaks as pain mgmt strategy during session. Pt completed bed mobility with supervision with use of rail. Session focus on LB dressing from EOB, RUE ROM, and funcitonal sit<>stands with RW. Education provided on putting BLEs on foot stool at home to decrease ROM needed to reach BLEs for LB dressing. Pt able to don L sock with foot propped up on trash can with increased time and CGA for balance, MOD A to don sock on R foot. Pt required MAX encouragement to try to complete task as pt likely to state, " I can't" before attempting task. Pt able to get sock over toes on R foot but required increased asssit to pull sock up to ankle. Pt states" I dont wear socks like this at home." Pt completed x3 sit<>stands from EOB with MIN A as precursor to higher level functional mobility. Pt demo good carryover of sh flexion self ROM technique pt previously instructed on. Pt additionally completed x10 reps AROM elbow flexion/ extension and sh ABD/ ADD. Pt returned to supine with MOD A to elevate BLEs back to bed. Pt left supine in bed with bed alarm activated and all needs within reach.   Therapy Documentation Precautions:  Precautions Precautions: Fall Precaution Comments: R hemiparesis Restrictions Weight Bearing Restrictions: No General:   Vital Signs: Therapy Vitals Temp: 98.1 F (36.7 C) Temp Source: Oral Pulse Rate: 65 Resp: 16 BP: (!) 106/52 Patient Position (if appropriate): Lying Oxygen Therapy SpO2: 99 % O2 Device: Room  Air Pain: PT reporting pain in L leg, however pt reports already taken pain meds. Provided increased rest breaks as pain mgmt strategy during session.   Therapy/Group: Individual Therapy  [name] 07/05/2019, 3:58 PM

## 2019-07-05 NOTE — Plan of Care (Signed)
  Problem: RH Ambulation Goal: LTG Patient will ambulate in home environment (PT) Description: LTG: Patient will ambulate in home environment, # of feet with assistance (PT). Outcome: Not Applicable Flowsheets (Taken 07/05/2019 0800) LTG: Pt will ambulate in home environ  assist needed:: (goal d/c) -- Note: D/c due to lack of progress due to continued sensory deficits.    Problem: RH Ambulation Goal: LTG Patient will ambulate in controlled environment (PT) Description: LTG: Patient will ambulate in a controlled environment, # of feet with assistance (PT). Flowsheets Taken 07/05/2019 0800 LTG: Pt will ambulate in controlled environ  assist needed:: Moderate Assistance - Patient 50 - 74% Taken 06/15/2019 1819 LTG: Ambulation distance in controlled environment: 41ft with LRAD Note: Down graded due to lack of progress due to continued sensory deficits.

## 2019-07-06 ENCOUNTER — Inpatient Hospital Stay (HOSPITAL_COMMUNITY): Payer: Medicare Other

## 2019-07-06 ENCOUNTER — Inpatient Hospital Stay (HOSPITAL_COMMUNITY): Payer: Medicare Other | Admitting: Physical Therapy

## 2019-07-06 ENCOUNTER — Ambulatory Visit (HOSPITAL_COMMUNITY): Payer: Medicare Other | Admitting: Physical Therapy

## 2019-07-06 ENCOUNTER — Encounter (HOSPITAL_COMMUNITY): Payer: Medicare Other | Admitting: Occupational Therapy

## 2019-07-06 ENCOUNTER — Encounter (HOSPITAL_COMMUNITY): Payer: Medicare Other | Admitting: Speech Pathology

## 2019-07-06 NOTE — Progress Notes (Signed)
Team Conference Report to Patient/Family  Team Conference discussion was reviewed with the patient, including goals, any changes in plan of care and target discharge date.  Patient expressed understanding and is in agreement.  The patient has a target discharge date of 07/08/19. [place] set up for discharge and family education set up for 07/06/19 @ 1 p.m.  Dorien Chihuahua B 07/06/2019, 12:06 PM

## 2019-07-06 NOTE — Care Management (Signed)
   The overall goal for the admission was met for:   Discharge location: Home with daughter and sister  Length of Stay: 24 days with discharge 07/08/19  Discharge activity level:  Patient to discharge at overall Modified Independent;Supervision;Min level.  Home/community participation: Clinical research associate provided included: MD, RD, PT, OT, SLP, RN, CM, TR, Pharmacy, Neuropsych and SW  Financial Services: Medicare and Other: UHC Medicare  Follow-up services arranged: Home Health: PT, OT, SLP, DME: W/C, RW, TTB and Patient/Family has no preference for HH/DME agencies  Comments (or additional information):Unable to secure [facility] services in [place] at this time. Daughter made aware that we will continue to pursue services and follow up with her once [facility] agency has been determined.   Patient/Family verbalized understanding of follow-up arrangements: Yes   Family Education 07/06/2019  Individual responsible for coordination of the follow-up plan:Daughter [phone]  Confirmed correct DME obtained by family: Margarito Liner 07/06/2019    Margarito Liner

## 2019-07-06 NOTE — Progress Notes (Signed)
Recreational Therapy Session Note  Patient Details  Name: Mark Clayton MRN: [id number] Date of Birth: 10-21-1940 Today's Date: 07/06/2019 Time:  900-930 Pain: no c/o Skilled Therapeutic Interventions/Progress Updates: Session focused on activity tolerance, dynamic standing balance and RUE use during co-treat with PT.  Pt performed sit-stands and car transfer with RW and min assist.  Pt sat w/c level to play Zoom ball using BUEs.  Pt min assist faded to supervision for RUE with this task.  Transitioned to standing for Zoom ball with min-mod assist for balance.  Therapy/Group: Co-Treatment   Mahamud Metts 07/06/2019, 3:15 PM

## 2019-07-06 NOTE — Progress Notes (Signed)
[facility] PHYSICAL MEDICINE & REHABILITATION PROGRESS NOTE   Subjective/Complaints: No complaints this morning.  Denies pain, constipation. Demonstrates improving right arm strength!  ROS: Patient denies CP, SOB, N/V/D    Objective:   No results found. No results for input(s): WBC, HGB, HCT, PLT in the last 72 hours. No results for input(s): NA, K, CL, CO2, GLUCOSE, BUN, CREATININE, CALCIUM in the last 72 hours.  Intake/Output Summary (Last 24 hours) at 07/06/2019 0929 Last data filed at 07/06/2019 0849 Gross per 24 hour  Intake 480 ml  Output 1096 ml  Net -616 ml     Physical Exam: Vital Signs Blood pressure 116/77, pulse 72, temperature 97.8 F (36.6 C), temperature source Oral, resp. rate 18, height 6' (1.829 m), weight 64.2 kg, SpO2 99 %.  General: No acute distress Mood and affect are appropriate Heart: Regular rate and rhythm no rubs murmurs or extra sounds Lungs: Clear to auscultation, breathing unlabored, no rales or wheezes Abdomen: Positive bowel sounds, soft nontender to palpation, nondistended Extremities: No clubbing, cyanosis, or edema Skin: No evidence of breakdown, no evidence of rash HOH Motor: RUE: Shoulder adduction 3-/5, elbow flexion/extension 3+/5, handgrip 3-/5  LUE: 4+/5 proximal distal LLE: 4+/5 proximal distal RLE: 1-/5 proximal to distal  Assessment/Plan: 1. Functional deficits secondary to CVA L MCA and subdural  which require 3+ hours per day of interdisciplinary therapy in a comprehensive inpatient rehab setting.  Physiatrist is providing close team supervision and 24 hour management of active medical problems listed below.  Physiatrist and rehab team continue to assess barriers to discharge/monitor patient progress toward functional and medical goals  Care Tool:  Bathing  Bathing activity did not occur: Refused Body parts bathed by patient: Right arm, Left arm, Abdomen, Chest   Body parts bathed by helper: Right lower leg Body parts  n/a: Front perineal area, Buttocks, Right lower leg, Left lower leg   Bathing assist Assist Level: Set up assist     Upper Body Dressing/Undressing Upper body dressing   What is the patient wearing?: Pull over shirt    Upper body assist Assist Level: Minimal Assistance - Patient > 75%    Lower Body Dressing/Undressing Lower body dressing      What is the patient wearing?: Pants     Lower body assist Assist for lower body dressing: Maximal Assistance - Patient 25 - 49%     Toileting Toileting Toileting Activity did not occur (Clothing management and hygiene only): Refused  Toileting assist Assist for toileting: Maximal Assistance - Patient 25 - 49%     Transfers Chair/bed transfer  Transfers assist     Chair/bed transfer assist level: Moderate Assistance - Patient 50 - 74%     Locomotion Ambulation   Ambulation assist   Ambulation activity did not occur: Safety/medical concerns  Assist level: Moderate Assistance - Patient 50 - 74% Assistive device: Walker-rolling Max distance: 50   Walk 10 feet activity   Assist  Walk 10 feet activity did not occur: Safety/medical concerns  Assist level: Moderate Assistance - Patient - 50 - 74% Assistive device: Walker-rolling   Walk 50 feet activity   Assist Walk 50 feet with 2 turns activity did not occur: Safety/medical concerns  Assist level: Moderate Assistance - Patient - 50 - 74% Assistive device: Walker-rolling    Walk 150 feet activity   Assist Walk 150 feet activity did not occur: Safety/medical concerns         Walk 10 feet on uneven surface  activity  Assist Walk 10 feet on uneven surfaces activity did not occur: Safety/medical concerns         Wheelchair     Assist Will patient use wheelchair at discharge?: Yes(LT goals set)      Wheelchair assist level: Supervision/Verbal cueing Max wheelchair distance: 50    Wheelchair 50 feet with 2 turns activity    Assist         Assist Level: Supervision/Verbal cueing   Wheelchair 150 feet activity     Assist      Assist Level: Maximal Assistance - Patient 25 - 49%   Blood pressure 116/77, pulse 72, temperature 97.8 F (36.6 C), temperature source Oral, resp. rate 18, height 6' (1.829 m), weight 64.2 kg, SpO2 99 %.  Medical Problem List and Plan: 1.  Left parafalcine subdural hematoma and subarachnoid hemorrhage, Left MCA M3 branch embolic stroke   Continue CIR PT, OT. SLP 2.  Antithrombotics: -DVT/anticoagulation:DC Heparin   Mechanical: Sequential compression devices, below knee Bilateral lower extremities per Neuro Dr Leonie Man, ok to resume Xarelto              -antiplatelet therapy: resuming Plavix  3. Pain Management: N/a 4. Mood: LCSW to follow for evaluation and support.              -antipsychotic agents: N/A 5. Neuropsych: This patient is capable of making decisions on his own behalf. 6. Skin/Wound Care: Routine pressure relief measures.  7. Fluids/Electrolytes/Nutrition: Monitor I/Os. Continue Vitamin B12 supplement 8. Acute on chronic CHF: On Coreg , lipitor, imdur and Lasix.  Monitor for signs of overload--monitor weights daily.  Filed Weights   07/04/19 0302 07/05/19 0500 07/06/19 0521  Weight: 65.9 kg 64.5 kg 64.2 kg   Weights stable 4/8 9. CAD/CAF/ s/p AICD: Continues imdur, Coreg and lipitor. Monitor HR tid.  Vitals:   07/05/19 1930 07/06/19 0542  BP: 118/61 116/77  Pulse: 71 72  Resp: 18 18  Temp: 97.7 F (36.5 C) 97.8 F (36.6 C)  SpO2: 99% 99%   Controlled on 4/8 10. Hypokalemia: 4.0 on 4/6  Resolved  11. ABLA: Resolved 12. Macular degeneration: Legally blind.  Needs magnifying glass to read normal print  13. Sleep disturbance: Trazodone 25mg  HS PRN 14.  Post stroke dysphagia-D3 thins liq - advance per SLP- no sign of aspiration   15. Heat rash: Has benadryl prn; used at 2am last night.  Continue Sarna lotion and Triamcinolone ointment BID.  16. Aphasia- mainly  expressive mild, SLP  17.  Acute lower UTI- still a little burning, pt with multiple species on culture, afebrile   Completed  18.  Hyponatremia- asymptomaticimproving may be due to  Lasix, , check serum and urine osmolality today - normal serum osmo with dliute urine, likely not  SIADH   -recheck labs 4/5 19.  Chronic vertigo since childhood pt does not recall inner ear infection used meclizine at home , may take prn 20. Constipation: improved after sorbitol, resumed Miralax, now moving bowels.    LOS: 22 days A FACE TO FACE EVALUATION WAS PERFORMED  Clide Deutscher Mark Clayton 07/06/2019, 9:29 AM

## 2019-07-06 NOTE — Plan of Care (Signed)
  Problem: Consults Goal: RH STROKE PATIENT EDUCATION Description: See Patient Education module for education specifics  Outcome: Progressing   Problem: RH BOWEL ELIMINATION Goal: RH STG MANAGE BOWEL WITH ASSISTANCE Description: STG Manage Bowel with  min Assistance. Outcome: Progressing Goal: RH STG MANAGE BOWEL W/MEDICATION W/ASSISTANCE Description: STG Manage Bowel with Medication with  min Assistance. Outcome: Progressing   Problem: RH BLADDER ELIMINATION Goal: RH STG MANAGE BLADDER WITH ASSISTANCE Description: STG Manage Bladder With  min Assistance Outcome: Progressing Goal: RH STG MANAGE BLADDER WITH MEDICATION WITH ASSISTANCE Description: STG Manage Bladder With Medication With Assistance. Outcome: Progressing   Problem: RH SKIN INTEGRITY Goal: RH STG SKIN FREE OF INFECTION/BREAKDOWN Description: No breakdown or infection at discharge Outcome: Progressing Goal: RH STG MAINTAIN SKIN INTEGRITY WITH ASSISTANCE Description: STG Maintain Skin Integrity With  min Assistance. Outcome: Progressing   Problem: RH SAFETY Goal: RH STG ADHERE TO SAFETY PRECAUTIONS W/ASSISTANCE/DEVICE Description: STG Adhere to Safety Precautions With  min Assistance/Device. Outcome: Progressing Goal: RH STG DECREASED RISK OF FALL WITH ASSISTANCE Description: STG Decreased Risk of Fall With min Assistance. Outcome: Progressing   Problem: RH PAIN MANAGEMENT Goal: RH STG PAIN MANAGED AT OR BELOW PT'S PAIN GOAL Description: Less than 3 Outcome: Progressing   Problem: RH KNOWLEDGE DEFICIT Goal: RH STG INCREASE KNOWLEDGE OF DIABETES Description: Increase knowledge prior to discharge Outcome: Progressing Goal: RH STG INCREASE KNOWLEDGE OF HYPERTENSION Description: Increase knowledge prior to discharge of medications  Outcome: Progressing Goal: RH STG INCREASE KNOWLEGDE OF HYPERLIPIDEMIA Description: Increase knowledge of medications prior to discharge. Outcome: Progressing Goal: RH STG  INCREASE KNOWLEDGE OF STROKE PROPHYLAXIS Description: Increase knowledge of signs and symptoms of stroke prevention prior to discharge. Outcome: Progressing

## 2019-07-06 NOTE — Progress Notes (Signed)
Speech Language Pathology Daily Session Note  Patient Details  Name: Mark Clayton MRN: [id number] Date of Birth: 18-Jul-1940  Today's Date: 07/06/2019 SLP Individual Time: 1102-1117 SLP Individual Time Calculation (min): 28 min  Short Term Goals: Week 3: SLP Short Term Goal 1 (Week 3): STG=LTG due to remaining LOS  Skilled Therapeutic Interventions: Pt was seen for skilled ST targeting education with pt and his caregivers (daughter and sister). Both were receptive to all education and appropriately engaged throughout session. They report pt is close to baseline level of cognitive-linguistic functioning now. Verbal review and handout of compensatory memory and word finding strategies provided, including examples of cues. Also provided verbal review and handout of current diet (dysphagia 3/thins) specifications. Reinforced recommendation for 24/7 supervision at discharge and specifically assistance with complex ADLs such as medication administration, cooking, etc. They verbally acknowledged all recommendations and impact of deficits on safety and daily functioning at home. Pt left sitting in wheelchair with family members still present and needs met to his satisfaction. Continue per current plan of care.        Pain Pain Assessment Pain Scale: Faces Faces Pain Scale: Hurts a little bit Pain Type: Acute pain Pain Location: Leg Pain Orientation: Right Pain Descriptors / Indicators: Discomfort;Grimacing Pain Onset: Sudden Pain Intervention(s): Repositioned Multiple Pain Sites: No  Therapy/Group: Individual Therapy  Arbutus Leas 07/06/2019, 2:23 PM

## 2019-07-06 NOTE — Progress Notes (Signed)
Speech Language Pathology Discharge Summary  Patient Details  Name: Mark Clayton MRN: [id number] Date of Birth: Nov 10, 1940  Today's Date: 07/07/2019 SLP Individual Time: 0905-1000 SLP Individual Time Calculation (min): 55 min   Skilled Therapeutic Interventions:  Pt was seen for skilled ST targeting goals for cognition.  SLP administered the MoCA-Blind version to measure progress from initial evaluation.  Pt scored 16/22 on assessment (N >/= 18) with deficits most notable for generative naming and completing math calculations.  Pt reports that he was not good at doing mental math prior to admission and his performance on today's evaluation was in line with his previous level of function.  Pt was very excited about going home tomorrow and was able to recall specific discharge recommendations in regards to equipment and supervision needs with supervision question cues.  Pt was left in bed with bed alarm set and call bell within reach.  Continue per current plan of care.      Patient has met 8 of 8 long term goals.  Patient to discharge at overall Modified Independent;Supervision;Min level.  Reasons goals not met: n/a   Clinical Impression/Discharge Summary:   Pt made functional gains and met 8 out of 8 long term goals this admission. Pt currently requires Supervision-Min assist for basic cognitive tasks and will require 24/7 supervision. Pt is consuming Dysphagia 3 (mechanical soft) texture diet with thin liquids and is Mod I for use of safe swallow strategies. Pt has demonstrated improved sustained attention, basic problem solving, anticipatory awareness, and use of compensatory memory strategies for short term recall. Pt is also Mod I for use of strategies for word finding in abstract conversation and correcting phonemic and semantic paraphasias. However, given mild cognitive deficits impacting his daily functioning still present, recommend pt continue to receive skilled ST services upon discharge.  Pt and family education is complete at this time.   Care Partner:  Caregiver Able to Provide Assistance: Yes  Type of Caregiver Assistance: Cognitive  Recommendation:  Home Health SLP;24 hour supervision/assistance  Rationale for SLP Follow Up: Maximize cognitive function and independence;Reduce caregiver burden   Equipment: none   Reasons for discharge: Discharged from hospital   Patient/Family Agrees with Progress Made and Goals Achieved: Yes    Arbutus Leas 07/06/2019, 7:53 PM

## 2019-07-06 NOTE — Progress Notes (Signed)
Physical Therapy Session Note  Patient Details  Name: Mark Clayton MRN: [id number] Date of Birth: [birth year]  Today's Date: 07/06/2019 PT Individual Time: 2890-2284 and 1445-1520 PT Individual Time Calculation (min): 40 min and 35 min    Short Term Goals: Week 3:  PT Short Term Goal 1 (Week 3): STG=LTG due to ELOS  Skilled Therapeutic Interventions/Progress Updates:   Session 1.   Pt received supine in bed and agreeable to PT. Supine>sit transfer with supervision assist and min cues for attention to task and awareness of RLE. Pt able to use RUE to control the RLE without instruction.   Sit,>stand x 3 from EOB with min assist to doff/don brief and pants with mod assist for clothing management and min assist to maintain balance with LUE supported on bed rail. Squat pivot transfer to [facility] with min assist and moderate cues for set up and safety to prevent LOB to the R.   Pt transported to orthogym in [place]. Car transfer to small SUV height with min assist overall and moderate cues for sequencing, UE placement and safety.   Pt instructed in BUE NRM with shuttle. Seated x 2 min with min cues for awareness of the RUE and then in standing x 2 min with min-mod assist for balance without UE support and moderate cues for ankle strategy to prevent posterior/R LOB.   Pt returned to room and performed stand pivot transfer to bed with min assist. Sit>supine completed with supervision and left supine in bed with call bell in reach and all needs met.    Session 2.  Pt received sitting in WC and agreeable to PT. Pt transported to orthogym in [place]. Family present for entire treatment. Car transfer completed x 2 with mod assist from PT on firest trial and mod assist provided by daughter on second. Min-mod cues for proper set up and safe guarding technique to prevent LOB. Pt ntoed to have improved posterior lean this PM compared to AM treatment. Step management in WC to access home performed x 1 with PT and x 1 with  daughter. Verbal and visual instruction to safety and WC parts management. Pt then instructed in gait training with RW x 70f with mod assist for safety and advancement/placement of the RLE due to scissoring. Pt returned to room and transfered to bed with mod assist. Sit<>stand with min assist for pt to doff pants. Sit>supine completed with supervision assist, and left supine in bed with call bell in reach and all needs met.        Therapy Documentation Precautions:  Precautions Precautions: Fall Precaution Comments: R hemiparesis Restrictions Weight Bearing Restrictions: No Vital Signs: Therapy Vitals Temp: 97.8 F (36.6 C) Temp Source: Oral Pulse Rate: 72 Resp: 18 BP: 116/77 Patient Position (if appropriate): Lying Oxygen Therapy SpO2: 99 % O2 Device: Room Air Pain:   denies  Therapy/Group: Individual Therapy  ALorie Phenix4/10/2019, 9:31 AM

## 2019-07-06 NOTE — Progress Notes (Signed)
Occupational Therapy Session Note  Patient Details  Name: Mark Clayton MRN: [id number] Date of Birth: [birth year]  Today's Date: 07/06/2019 OT Individual Time: 1300-1345 OT Individual Time Calculation (min): 45 min    Short Term Goals: Week 3:  OT Short Term Goal 1 (Week 3): STG= LTG secondary to ELOS  Skilled Therapeutic Interventions/Progress Updates:    Upon entering the room, pt supine in bed with no c/o pain and agreeable to OT intervention. Pt's daughter and sister present for hands on family education. OT educating caregivers on pt's performance and goal level. Pt standing from bed to pull pants over B hips with min A. OT suggested foot stool to tread clothing onto R LE at home. Pt and family verbalize request to do squat pivot transfer only. Caregivers each practicing transferring pt L <> R from bed <>wheelchair with use of gait belt and proper technique. OT assisted pt to tub room and demonstrated squat pivot transfer onto TTB with caregiver, [name], then returning demonstration with good technique and safety. Squat pivot transfers to the L with min A and to the R with mod A. Pt returning to room at end of session. OT answering caregivers questions regarding discharge and education of HHOT recommendation. Pt remained in wheelchair awaiting next therapy session.   Therapy Documentation Precautions:  Precautions Precautions: Fall Precaution Comments: R hemiparesis Restrictions Weight Bearing Restrictions: No General:   Vital Signs: Therapy Vitals Temp: 98 F (36.7 C) Pulse Rate: 74 Resp: 16 BP: 101/69 Patient Position (if appropriate): Sitting Oxygen Therapy SpO2: 98 % O2 Device: Room Air Pain: Pain Assessment Pain Scale: Faces Faces Pain Scale: Hurts a little bit Pain Type: Acute pain Pain Location: Leg Pain Orientation: Right Pain Descriptors / Indicators: Discomfort;Grimacing Pain Onset: Sudden Pain Intervention(s): Repositioned Multiple Pain Sites:  No   Therapy/Group: Individual Therapy  Gypsy Decant 07/06/2019, 4:16 PM

## 2019-07-06 NOTE — Progress Notes (Signed)
Occupational Therapy Session Note  Patient Details  Name: KRISHIV ROESSLER MRN: [id number] Date of Birth: Dec 24, 1940  Today's Date: 07/06/2019 OT Individual Time: 1015-1100 OT Individual Time Calculation (min): 45 min    Short Term Goals: Week 3:  OT Short Term Goal 1 (Week 3): STG= LTG secondary to ELOS  Skilled Therapeutic Interventions/Progress Updates:  Pt received supine in bed reporting having a good day and agreeable to OT intervention. Session focus on functional transfer trainin, LB Dressing and medication mgmt task. Pt completed supine>sit from flat [facility] with supervision. MOD A to don pants EOB with RLE propped up on trash can. MOD A for squat pivot transfer to pts L side to w/c. Pt attempted w/c propulsion with little success reporting increased fatigue in RUE, transported pt to therapy gym for time mgmt. Pt completed seated medication mgmt task to facilitate [facility] for higher level ADL participation. Pt reports likely not having to organize medication at home, however pt able to manipulate lids and weekly pill organizer with supervision and increased time to locate correct pillbox d/t visual deficits. Education provided on using contrasting color pillbox at home d/t visual deficits. Pt transported back to room in similar fashion as previously indicated where pt returned to supine with MIN A to elevate BLEs back to bed; MAX A to doff pants in supine. Pt left supine with all needs within reach and bed alarm activated.   Therapy Documentation Precautions:  Precautions Precautions: Fall Precaution Comments: R hemiparesis Restrictions Weight Bearing Restrictions: No General:   Vital Signs:   Pain: Pt reports pain in RUE, provided increased rest breaks.   Therapy/Group: Individual Therapy  [name] 07/06/2019, 11:42 AM

## 2019-07-07 ENCOUNTER — Inpatient Hospital Stay (HOSPITAL_COMMUNITY): Payer: Medicare Other | Admitting: Physical Therapy

## 2019-07-07 ENCOUNTER — Inpatient Hospital Stay (HOSPITAL_COMMUNITY): Payer: Medicare Other | Admitting: Speech Pathology

## 2019-07-07 ENCOUNTER — Inpatient Hospital Stay (HOSPITAL_COMMUNITY): Payer: Medicare Other

## 2019-07-07 ENCOUNTER — Inpatient Hospital Stay (HOSPITAL_COMMUNITY): Payer: Medicare Other | Admitting: Occupational Therapy

## 2019-07-07 MED ORDER — CLOPIDOGREL BISULFATE 75 MG PO TABS: 75.0000 mg | ORAL_TABLET | Freq: Every day | ORAL | Status: DC

## 2019-07-07 MED ORDER — MECLIZINE HCL 12.5 MG PO TABS: 12.5000 mg | ORAL_TABLET | Freq: Two times a day (BID) | ORAL | 0 refills | Status: DC | PRN

## 2019-07-07 MED ORDER — CARVEDILOL 12.5 MG PO TABS: 12.5000 mg | ORAL_TABLET | Freq: Two times a day (BID) | ORAL | 0 refills | Status: DC

## 2019-07-07 MED ORDER — POTASSIUM CHLORIDE CRYS ER 10 MEQ PO TBCR: 10.0000 meq | EXTENDED_RELEASE_TABLET | Freq: Two times a day (BID) | ORAL | 0 refills | Status: DC

## 2019-07-07 MED ORDER — CAMPHOR-MENTHOL 0.5-0.5 % EX LOTN: TOPICAL_LOTION | Freq: Two times a day (BID) | CUTANEOUS | 0 refills | Status: DC

## 2019-07-07 NOTE — Progress Notes (Signed)
Occupational Therapy Session Note  Patient Details  Name: Mark Clayton MRN: [id number] Date of Birth: February 28, 1941  Today's Date: 07/07/2019 OT Individual Time: 1100-1200 OT Individual Time Calculation (min): 60 min    Short Term Goals: Week 3:  OT Short Term Goal 1 (Week 3): STG= LTG secondary to ELOS  Skilled Therapeutic Interventions/Progress Updates:  Pt received supine in bed agreeable to OT intervention. Session focus on BADLs, IADLs and functional mobility. Pt completed bed mobility with supervision with HOB slightly elevated. Education provided on use of gait belt to advance BLEs during bed mobility or tub shower if needed with pt verbalizing understanding. Trialed use of sock aid with little success and pt declining usage; MAX A for LB dressing. Pt complete sit<>stand with MINA to pull pants up to waist line with RW. MIN A for squat pivot transfer to pts L side. Pt transported to ADL apartment with total A for time mgmt. Pt completed IADL task from w/c level where pt instructed to retrieve food items from refrigerator for simple meal prep task. Pt able to self- propel w/c through kitchen with supervision utilizing LLE to advance chair. Pt also able to reach into pantry to place food items with supervision. Pt declined further toilet transfer training as well as transfers to/from various surface heights. Pt transported back to room in similar fashion as previously indicated. Pt left supine in bed with bed alarm activated and all needs within reach.   Therapy Documentation Precautions:  Precautions Precautions: Fall Precaution Comments: R hemiparesis Restrictions Weight Bearing Restrictions: No General:   Vital Signs:   Pain: Pt reports no pain during session.   Therapy/Group: Individual Therapy  [name] 07/07/2019, 12:15 PM

## 2019-07-07 NOTE — Plan of Care (Signed)
  Problem: Consults Goal: RH STROKE PATIENT EDUCATION Description: See Patient Education module for education specifics  Outcome: Progressing   Problem: RH BOWEL ELIMINATION Goal: RH STG MANAGE BOWEL WITH ASSISTANCE Description: STG Manage Bowel with  min Assistance. Outcome: Progressing Goal: RH STG MANAGE BOWEL W/MEDICATION W/ASSISTANCE Description: STG Manage Bowel with Medication with  min Assistance. Outcome: Progressing   Problem: RH BLADDER ELIMINATION Goal: RH STG MANAGE BLADDER WITH ASSISTANCE Description: STG Manage Bladder With  min Assistance Outcome: Progressing Goal: RH STG MANAGE BLADDER WITH MEDICATION WITH ASSISTANCE Description: STG Manage Bladder With Medication With Assistance. Outcome: Progressing   Problem: RH SKIN INTEGRITY Goal: RH STG SKIN FREE OF INFECTION/BREAKDOWN Description: No breakdown or infection at discharge Outcome: Progressing Goal: RH STG MAINTAIN SKIN INTEGRITY WITH ASSISTANCE Description: STG Maintain Skin Integrity With  min Assistance. Outcome: Progressing   Problem: RH SAFETY Goal: RH STG ADHERE TO SAFETY PRECAUTIONS W/ASSISTANCE/DEVICE Description: STG Adhere to Safety Precautions With  min Assistance/Device. Outcome: Progressing Goal: RH STG DECREASED RISK OF FALL WITH ASSISTANCE Description: STG Decreased Risk of Fall With min Assistance. Outcome: Progressing   Problem: RH PAIN MANAGEMENT Goal: RH STG PAIN MANAGED AT OR BELOW PT'S PAIN GOAL Description: Less than 3 Outcome: Progressing   Problem: RH KNOWLEDGE DEFICIT Goal: RH STG INCREASE KNOWLEDGE OF DIABETES Description: Increase knowledge prior to discharge Outcome: Progressing Goal: RH STG INCREASE KNOWLEDGE OF HYPERTENSION Description: Increase knowledge prior to discharge of medications  Outcome: Progressing Goal: RH STG INCREASE KNOWLEGDE OF HYPERLIPIDEMIA Description: Increase knowledge of medications prior to discharge. Outcome: Progressing Goal: RH STG  INCREASE KNOWLEDGE OF STROKE PROPHYLAXIS Description: Increase knowledge of signs and symptoms of stroke prevention prior to discharge. Outcome: Progressing

## 2019-07-07 NOTE — Discharge Summary (Signed)
Occupational Therapy Discharge Summary  Patient Details  Name: Mark Clayton MRN: [id number] Date of Birth: 06-Mar-1941   Patient has met 11 of 12 long term goals due to improved activity tolerance, improved balance, postural control, ability to compensate for deficits, functional use of  RIGHT upper extremity, improved awareness and improved coordination. Pt has made good progress with functional transfers, BADL participation, balance, increased RUE [employer] and functional mobility. Overall, pt requires supervision for bed mobility, needing occasional assist to bring BLES back to bed; MINA for sit<>stand with RW; MIN- MOD A for squat pivot toilet transfers with no AD. Pt completes UB/LB bathing at sink with set- up assist. Pt completes squat pivot transfer to TTB with MIN- MOD A needing assist to advance RLE over tub. Pt requires supervision for seated grooming tasks at sink needing intermittent cues to locate items d/t visual deficits as well as cues to incorporate RUE into ADL routine d/t persistent R inattention.  Pt continues to present with decreased proprioception, impaired praxis, visual deficits (macular degeneration), RUE sensory deficits and R inattention limiting pts ability to engage in ADLs, however, pts family present for family education and able to demo good safety awareness during transfer training and verbalized understanding of pts current level of assist.  Patient to discharge at overall Mod Assist level.  Patient's daughter and sister have participated in hands on family education to provide the needed assistance at d/c.   Pt still requires Max A for LB dressing and therefore this goal was unable to be met  Recommendation:  Patient will benefit from ongoing skilled OT services in home health setting to continue to advance functional skills in the area of BADL.  Equipment: TTB  Reasons for discharge: treatment goals met and discharge from hospital  Patient/family agrees with  progress made and goals achieved: Yes  OT Discharge Precautions/Restrictions  Precautions Precautions: Fall Precaution Comments: R hemiparesis Restrictions Weight Bearing Restrictions: No Pain Pain Assessment Pain Scale: 0-10 Pain Score: 0-No pain ADL ADL Eating: Independent Grooming: Modified independent Where Assessed-Grooming: Sitting at sink Upper Body Bathing: Setup Where Assessed-Upper Body Bathing: Sitting at sink Lower Body Bathing: Setup Where Assessed-Lower Body Bathing: Sitting at sink Upper Body Dressing: Minimal cueing Where Assessed-Upper Body Dressing: Edge of bed Lower Body Dressing: Maximal assistance Where Assessed-Lower Body Dressing: Edge of bed Toileting: Moderate assistance Where Assessed-Toileting: Glass blower/designer: Moderate assistance Toilet Transfer Method: Squat pivot Toilet Transfer Equipment: Energy manager: Moderate assistance Tub/Shower Transfer Method: Squat pivot Tub/Shower Equipment: Radio broadcast assistant Vision Baseline Vision/History: Macular Degeneration Patient Visual Report: Other (comment)(reports new onsight of farsightedness to f/u with eye MD) Additional Comments: mild R inattention Perception  Perception: Impaired Inattention/Neglect: Does not attend to right side of body Praxis Praxis: Impaired Praxis Impairment Details: Perseveration;Motor planning Cognition Overall Cognitive Status: Within Functional Limits for tasks assessed Arousal/Alertness: Awake/alert Orientation Level: Oriented X4 Sustained Attention: Impaired Sustained Attention Impairment: Functional basic Memory: Impaired Memory Impairment: Decreased short term memory;Storage deficit Decreased Short Term Memory: Verbal basic;Functional basic Awareness: Impaired Awareness Impairment: Anticipatory impairment Problem Solving: Impaired Problem Solving Impairment: Verbal basic;Functional basic Executive Function: Sequencing Sequencing:  Impaired Sequencing Impairment: Functional basic Behaviors: Poor frustration tolerance Safety/Judgment: Impaired Comments: mild R inattention Sensation Sensation Light Touch: Impaired Detail Light Touch Impaired Details: Absent RLE;Impaired RUE Hot/Cold: Impaired by gross assessment Proprioception: Impaired Detail Proprioception Impaired Details: Absent RLE;Impaired RUE Stereognosis: Not tested Coordination Gross Motor Movements are Fluid and Coordinated: No Fine Motor Movements are Fluid and Coordinated:  No Coordination and Movement Description: ataxia RUE. dense R hemiplegia LE Finger Nose Finger Test: ataxia RUE Heel Shin Test: Unable to perform Motor  Motor Motor: Hemiplegia;Ataxia;Motor apraxia Motor - Discharge Observations: R hemiplegia LE>LE. apraxia in the RLE Mobility  Bed Mobility Bed Mobility: Sit to Supine;Rolling Left;Supine to Sit Rolling Right: Supervision/verbal cueing Rolling Left: Supervision/Verbal cueing Supine to Sit: Supervision/Verbal cueing Sit to Supine: Minimal Assistance - Patient > 75% Transfers Sit to Stand: Moderate Assistance - Patient 50-74% Stand to Sit: Contact Guard/Touching assist  Trunk/Postural Assessment  Cervical Assessment Cervical Assessment: Exceptions to [facility] Thoracic Assessment Thoracic Assessment: Exceptions to [facility] Lumbar Assessment Lumbar Assessment: Exceptions to [facility] Postural Control Postural Control: Deficits on evaluation Righting Reactions: mild R lean. able to correct spontaneously Protective Responses: delayedon the R Postural Limitations: mild pushing R with funcitonal movement. greatly improved from eval  Balance Standardized Balance Assessment Standardized Balance Assessment: PASS Static Sitting Balance Static Sitting - Balance Support: Feet supported;No upper extremity supported Static Sitting - Level of Assistance: 5: Stand by assistance Dynamic Sitting Balance Dynamic Sitting - Balance Support: Feet  supported;No upper extremity supported Dynamic Sitting - Level of Assistance: 5: Stand by assistance Dynamic Sitting - Balance Activities: Reaching for objects Sitting balance - Comments: close SBA for safety; RUE tremors noted no present on eval Static Standing Balance Static Standing - Balance Support: Bilateral upper extremity supported Static Standing - Level of Assistance: 4: Min assist Extremity/Trunk Assessment RUE Assessment RUE Assessment: Exceptions to [facility] Passive Range of Motion (PROM) Comments: WFLs Active Range of Motion (AROM) Comments: elbow and shoulder 3-/5; digits and wrist 4/5 RUE Body System: Neuro Brunstrum levels for arm and hand: Arm;Hand Brunstrum level for arm: Stage V Relative Independence from Synergy Brunstrum level for hand: Stage VI Isolated joint movements LUE Assessment LUE Assessment: Within Functional Limits   [name] 07/07/2019, 11:29 AM

## 2019-07-07 NOTE — Care Management (Addendum)
Patient ID: Mark Clayton, male   DOB: November 03, 1940, 79 y.o.   MRN: [id number] Unable to secure [facility] services in the [facility] area for discharge and patient is currently not a candidate for OP services.   [facility] referral sent to [facility], [place], [facility], [place], [facility], and [facility] without success due to staffing in the [facility] area.  [facility] has not responded to the [facility] request to date.  Spoke with the daughter regarding access to [facility] services and the delay due to staffing issues. States an understanding of information reviewed that we will continue to pursue [facility] services for her father however there will be a delay in the initiation of services after discharge scheduled for tomorrow; 07/08/19. Daughter noted she had several pieces of DME however after review of items, she would like a TTB ordered. Reviewed this was an out of pocket charge item and she states an understanding and is wiling to cover the cost of the item needed.

## 2019-07-07 NOTE — Progress Notes (Signed)
[facility] PHYSICAL MEDICINE & REHABILITATION PROGRESS NOTE   Subjective/Complaints: Really doing well. Has an occasional cramp RLE.  Rolling himself down hall with PT. Excited to be going home tomorrow  ROS: Patient denies fever, rash, sore throat, blurred vision, nausea, vomiting, diarrhea, cough, shortness of breath or chest pain, joint or back pain, headache, or mood change.      No results found. No results for input(s): WBC, HGB, HCT, PLT in the last 72 hours. No results for input(s): NA, K, CL, CO2, GLUCOSE, BUN, CREATININE, CALCIUM in the last 72 hours.  Intake/Output Summary (Last 24 hours) at 07/07/2019 0857 Last data filed at 07/07/2019 0641 Gross per 24 hour  Intake 420 ml  Output 450 ml  Net -30 ml     Physical Exam: Vital Signs Blood pressure 131/84, pulse 80, temperature 97.8 F (36.6 C), temperature source Oral, resp. rate 17, height 6' (1.829 m), weight 62.5 kg, SpO2 100 %.  Constitutional: No distress . Vital signs reviewed. HEENT: EOMI, oral membranes moist Neck: supple Cardiovascular: RRR without murmur. No JVD    Respiratory/Chest: CTA Bilaterally without wheezes or rales. Normal effort    GI/Abdomen: BS +, non-tender, non-distended Ext: no clubbing, cyanosis, or edema Psych: pleasant and cooperative Skin: No evidence of breakdown, no evidence of rash HOH Motor: RUE: Shoulder adduction 3+/5, elbow flexion/extension 3+/5, handgrip 3/5  LUE: 4+/5 proximal distal LLE: 4+/5 proximal distal RLE: 1 to 1+ HE,HF, 1 KE and tr distally  Assessment/Plan: 1. Functional deficits secondary to CVA L MCA and subdural  which require 3+ hours per day of interdisciplinary therapy in a comprehensive inpatient rehab setting.  Physiatrist is providing close team supervision and 24 hour management of active medical problems listed below.  Physiatrist and rehab team continue to assess barriers to discharge/monitor patient progress toward functional and medical goals  Care  Tool:  Bathing  Bathing activity did not occur: Refused Body parts bathed by patient: Right arm, Left arm, Abdomen, Chest   Body parts bathed by helper: Right lower leg Body parts n/a: Front perineal area, Buttocks, Right lower leg, Left lower leg   Bathing assist Assist Level: Set up assist     Upper Body Dressing/Undressing Upper body dressing   What is the patient wearing?: Pull over shirt    Upper body assist Assist Level: Minimal Assistance - Patient > 75%    Lower Body Dressing/Undressing Lower body dressing      What is the patient wearing?: Pants     Lower body assist Assist for lower body dressing: Maximal Assistance - Patient 25 - 49%     Toileting Toileting Toileting Activity did not occur Landscape architect and hygiene only): Refused  Toileting assist Assist for toileting: Maximal Assistance - Patient 25 - 49%     Transfers Chair/bed transfer  Transfers assist     Chair/bed transfer assist level: Minimal Assistance - Patient > 75%     Locomotion Ambulation   Ambulation assist   Ambulation activity did not occur: Safety/medical concerns  Assist level: Moderate Assistance - Patient 50 - 74% Assistive device: Walker-rolling Max distance: 10   Walk 10 feet activity   Assist  Walk 10 feet activity did not occur: Safety/medical concerns  Assist level: Moderate Assistance - Patient - 50 - 74% Assistive device: Walker-rolling   Walk 50 feet activity   Assist Walk 50 feet with 2 turns activity did not occur: Safety/medical concerns  Assist level: Moderate Assistance - Patient - 50 - 74% Assistive  device: Walker-rolling    Walk 150 feet activity   Assist Walk 150 feet activity did not occur: Safety/medical concerns         Walk 10 feet on uneven surface  activity   Assist Walk 10 feet on uneven surfaces activity did not occur: Safety/medical concerns         Wheelchair     Assist Will patient use wheelchair at  discharge?: Yes(LT goals set) Type of Wheelchair: Manual    Wheelchair assist level: Supervision/Verbal cueing Max wheelchair distance: 150    Wheelchair 50 feet with 2 turns activity    Assist        Assist Level: Supervision/Verbal cueing   Wheelchair 150 feet activity     Assist      Assist Level: Supervision/Verbal cueing   Blood pressure 131/84, pulse 80, temperature 97.8 F (36.6 C), temperature source Oral, resp. rate 17, height 6' (1.829 m), weight 62.5 kg, SpO2 100 %.  Medical Problem List and Plan: 1.  Left parafalcine subdural hematoma and subarachnoid hemorrhage, Left MCA M3 branch embolic stroke   Continue CIR PT, OT. SLP  -ELOS 4/10  -Patient to see Dr. Ranell Patrick in the office for transitional care encounter in 1-2 weeks.  2.  Antithrombotics: -DVT/anticoagulation: resumed Xarelto              -antiplatelet therapy: resuming Plavix  3. Pain Management: N/a  -reviewed stretches, use of heat for periodic cramping in RLE 4. Mood: LCSW to follow for evaluation and support.              -antipsychotic agents: N/A 5. Neuropsych: This patient is capable of making decisions on his own behalf. 6. Skin/Wound Care: Routine pressure relief measures.  7. Fluids/Electrolytes/Nutrition: Monitor I/Os. Continue Vitamin B12 supplement 8. Acute on chronic CHF: On Coreg , lipitor, imdur and Lasix.  Monitor for signs of overload--monitor weights daily.  Filed Weights   07/05/19 0500 07/06/19 0521 07/07/19 0353  Weight: 64.5 kg 64.2 kg 62.5 kg   Weights stable 4/9 9. CAD/CAF/ s/p AICD: Continues imdur, Coreg and lipitor. Monitor HR tid.  Vitals:   07/06/19 1933 07/07/19 0351  BP: 109/67 131/84  Pulse: 74 80  Resp: 18 17  Temp: 97.6 F (36.4 C) 97.8 F (36.6 C)  SpO2: 98% 100%   Controlled on 4/9 10. Hypokalemia: 4.0 on 4/6  Resolved  11. ABLA: Resolved 12. Macular degeneration: Legally blind.  Needs magnifying glass to read normal print  13. Sleep  disturbance: Trazodone 25mg  HS PRN 14.  Post stroke dysphagia-D3 thins liq - advance further per SLP- no sign of aspiration   15. Heat rash: Has benadryl prn; used at 2am last night.  Continue Sarna lotion and Triamcinolone ointment BID.  16. Aphasia- mainly expressive mild, much improved 17.  Acute lower UTI- still a little burning, pt with multiple species on culture, afebrile   Completed  18.  Hyponatremia- asymptomaticimproving may be due to  Lasix, , check serum and urine osmolality today - normal serum osmo with dliute urine, likely not  SIADH   -recheck labs 4/5 19.  Chronic vertigo since childhood pt does not recall inner ear infection used meclizine at home , may take prn 20. Constipation: improved after sorbitol, resumed Miralax, now moving bowels.    LOS: 23 days A FACE TO FACE EVALUATION WAS PERFORMED  Meredith Staggers 07/07/2019, 8:57 AM

## 2019-07-07 NOTE — Progress Notes (Signed)
Physical Therapy Discharge Summary  Patient Details  Name: Mark Clayton MRN: [id number] Date of Birth: [birth year]  Today's Date: 07/07/2019 PT Individual Time: 5053-9767 PT Individual Time Calculation (min): 44 min    Patient has met 8 of 8 long term goals due to improved activity tolerance, improved balance, improved postural control, increased strength, increased range of motion, decreased pain, ability to compensate for deficits, functional use of  right upper extremity and right lower extremity, improved attention, improved awareness and improved coordination.  Patient to discharge at a wheelchair level [place].   Patient's care partner is independent to provide the necessary physical and cognitive assistance at discharge.  Reasons goals not met: All PT goals met   Recommendation:  Patient will benefit from ongoing skilled PT services in home health setting to continue to advance safe functional mobility, address ongoing impairments in balance, strength, coordination, safety, tranfers, and minimize fall risk.  Equipment: WC. RW  Reasons for discharge: treatment goals met and discharge from hospital  Patient/family agrees with progress made and goals achieved: Yes   PT Treatment:  Sessions 1  Pt received supine in bed and agreeable to PT. Supine>sit transfer with supervision assist and heavy use of bed rail. Sit<>stand wqith min assist and LUE on bed rail to pull pants to waist. Min assist to maintain balance without UE support while managing pants. Stand pivot transfer to [facility] with UE support on WC arm rest with min assist. PT instructed pt in Grad day assessment to measure progress toward goals. See below for details. Gait training performed x 78f with mod asist to advance the RLE, improved hip and knee flexion noted today, with assist to initiate. WC mobility x 1532fwith supervision assist and cues for safety in turns. Stair management training as listed below with mod assist x 2  steps. Pt returned to room and performed stand transfer to bed with min assist and UE support on bed rail. Sit>supine completed with supervision assist using the and left supine in bed with call bell in reach and all needs met.   Session 2.  PT attempted to see for PM session; pt refused stating that he is exhausted from frequent BM this afternoon. PT encouraged bed level therapy, which pt denied. Left supine in bed with call bell in reach and all needs met.      PT Discharge Precautions/Restrictions Fall  Pain   denies Vision/Perception  Vision - Assessment Additional Comments: mild R sided inattention. greatly improved from eval Perception Perception: Impaired Inattention/Neglect: Does not attend to right side of body Praxis Praxis: Impaired Praxis Impairment Details: Perseveration;Motor planning  Cognition Overall Cognitive Status: Impaired/Different from baseline Arousal/Alertness: Awake/alert Orientation Level: Oriented X4 Sustained Attention: Impaired Sustained Attention Impairment: Functional basic Memory: Impaired Memory Impairment: Decreased short term memory;Storage deficit Decreased Short Term Memory: Verbal basic;Functional basic Awareness: Impaired Awareness Impairment: Anticipatory impairment Problem Solving: Impaired Problem Solving Impairment: Verbal basic;Functional basic Executive Function: Sequencing Sequencing: Impaired Sequencing Impairment: Functional basic Behaviors: Poor frustration tolerance Safety/Judgment: Impaired Comments: mild R inattention Sensation Sensation Light Touch: Impaired Detail Light Touch Impaired Details: Absent RLE;Impaired RUE Hot/Cold: Impaired by gross assessment Proprioception: Impaired Detail Proprioception Impaired Details: Absent RLE;Impaired RUE Coordination Gross Motor Movements are Fluid and Coordinated: No Fine Motor Movements are Fluid and Coordinated: No Coordination and Movement Description: ataxia RUE. dense  R hemiplegia LE Finger Nose Finger Test: ataxia RUE Heel Shin Test: Unable to perform Motor  Motor Motor: Hemiplegia;Ataxia;Motor apraxia Motor - Discharge Observations: R  hemiplegia LE>LE. apraxia in the RLE  Mobility Bed Mobility Bed Mobility: Rolling Right;Sit to Supine;Rolling Left;Supine to Sit Rolling Right: Supervision/verbal cueing Rolling Left: Supervision/Verbal cueing Supine to Sit: Supervision/Verbal cueing Sit to Supine: Supervision/Verbal cueing Transfers Transfers: Stand Pivot Transfers Sit to Stand: Minimal Assistance - Patient > 75% Stand Pivot Transfers: Minimal Assistance - Patient > 75% Squat Pivot Transfers: Minimal Assistance - Patient > 75% Transfer (Assistive device): (bed and WC rail) Locomotion  Gait Ambulation: Yes Gait Assistance: Moderate Assistance - Patient 50-74% Gait Distance (Feet): 25 Feet Assistive device: Rolling walker Gait Gait: Yes Gait Pattern: Ataxic;Lateral hip instability;Scissoring;Right steppage;Decreased weight shift to left Stairs / Additional Locomotion Stairs: Yes Stairs Assistance: Moderate Assistance - Patient 50 - 74% Stair Management Technique: Two rails Height of Stairs: 6 Wheelchair Mobility Wheelchair Mobility: Yes Wheelchair Assistance: Supervision/Verbal cueing Wheelchair Propulsion: Left upper extremity;Left lower extremity Wheelchair Parts Management: Needs assistance Distance: 150  Trunk/Postural Assessment  Cervical Assessment Cervical Assessment: Exceptions to [facility] Thoracic Assessment Thoracic Assessment: Exceptions to [facility] Lumbar Assessment Lumbar Assessment: Exceptions to [facility] Postural Control Postural Control: Deficits on evaluation Righting Reactions: mild R lean. able to correct spontaneously Protective Responses: delayedon the R Postural Limitations: mild pushing R with funcitonal movement. greatly improved from eval  Balance Standardized Balance Assessment Standardized Balance Assessment:  PASS Extremity Assessment      RLE Assessment RLE Assessment: Exceptions to [facility] Strength Comments: 3+/5 in extensor pattern with functional movement. unable to control volitionally. 1/5 hip flexion/knee flexion functionally with gait LLE Assessment LLE Assessment: Within Functional Limits    Lorie Phenix 07/07/2019, 8:52 AM

## 2019-07-07 NOTE — Discharge Summary (Signed)
Physician Discharge Summary  Patient ID: Mark Clayton MRN: [id number] DOB/AGE: Jan 04, 1941 79 y.o.  Admit date: 06/14/2019 Discharge date: 07/08/2019  Discharge Diagnoses:  Principal Problem:   Embolic stroke [facility]) Active Problems:   Chronic systolic heart failure ([place])   Essential hypertension   Acute lower UTI   Dysphagia, post-stroke   Hypokalemia   Discharged Condition: stable   Significant Diagnostic Studies: CT HEAD WO CONTRAST  Result Date: 06/27/2019 CLINICAL DATA:  Follow-up intracranial hemorrhage and cerebral infarction. EXAM: CT HEAD WITHOUT CONTRAST TECHNIQUE: Contiguous axial images were obtained from the base of the skull through the vertex without intravenous contrast. COMPARISON:  Multiple prior head CTs dating back to 06/06/2019 FINDINGS: Brain: Resolution of intracranial hemorrhage including subarachnoid and subdural blood at the vertex. No new areas of hemorrhage are identified. Evolutionary changes in the left parietal infarct. No findings for new/acute infarction. The ventricles are in the midline without mass effect or shift and are stable in size and configuration. The brainstem and cerebellum are unremarkable and stable. Giant cisterna magna again noted. Vascular: Stable vascular calcifications. No hyperdense vessels or aneurysm. Skull: No skull fracture or bone lesions. Sinuses/Orbits: The paranasal sinuses and mastoid air cells are clear. The globes are intact. Other: No scalp lesions or hematoma. IMPRESSION: 1. Resolution of intracranial hemorrhage. 2. Evolutionary changes in the left parietal infarct. 3. No new/acute intracranial findings. Electronically Signed   By: Marijo Sanes M.D.   On: 06/27/2019 08:38    VAS [place] LOWER EXTREMITY VENOUS (DVT)  Result Date: 06/15/2019  Lower Venous DVTStudy Indications: Stroke. Other Indications: Immobility/ hemiparesis. Comparison Study: no prior Performing Technologist: Abram Sander RVS  Examination Guidelines: A complete  evaluation includes B-mode imaging, spectral Doppler, color Doppler, and power Doppler as needed of all accessible portions of each vessel. Bilateral testing is considered an integral part of a complete examination. Limited examinations for reoccurring indications may be performed as noted. The reflux portion of the exam is performed with the patient in reverse Trendelenburg.  +---------+---------------+---------+-----------+----------+--------------+ RIGHT    CompressibilityPhasicitySpontaneityPropertiesThrombus Aging +---------+---------------+---------+-----------+----------+--------------+ CFV      Full           Yes      Yes                                 +---------+---------------+---------+-----------+----------+--------------+ SFJ      Full                                                        +---------+---------------+---------+-----------+----------+--------------+ FV Prox  Full                                                        +---------+---------------+---------+-----------+----------+--------------+ FV Mid   Full                                                        +---------+---------------+---------+-----------+----------+--------------+ FV DistalFull                                                        +---------+---------------+---------+-----------+----------+--------------+  PFV      Full                                                        +---------+---------------+---------+-----------+----------+--------------+ POP      Full           Yes      Yes                                 +---------+---------------+---------+-----------+----------+--------------+ PTV      Full                                                        +---------+---------------+---------+-----------+----------+--------------+ PERO     Full                                                         +---------+---------------+---------+-----------+----------+--------------+   +---------+---------------+---------+-----------+----------+--------------+ LEFT     CompressibilityPhasicitySpontaneityPropertiesThrombus Aging +---------+---------------+---------+-----------+----------+--------------+ CFV      Full           Yes      Yes                                 +---------+---------------+---------+-----------+----------+--------------+ SFJ      Full                                                        +---------+---------------+---------+-----------+----------+--------------+ FV Prox  Full                                                        +---------+---------------+---------+-----------+----------+--------------+ FV Mid   Full                                                        +---------+---------------+---------+-----------+----------+--------------+ FV DistalFull                                                        +---------+---------------+---------+-----------+----------+--------------+ PFV      Full                                                        +---------+---------------+---------+-----------+----------+--------------+  POP      Full           Yes      Yes                                 +---------+---------------+---------+-----------+----------+--------------+ PTV      Full                                                        +---------+---------------+---------+-----------+----------+--------------+ PERO     Full                                                        +---------+---------------+---------+-----------+----------+--------------+     Summary: BILATERAL: - No evidence of deep vein thrombosis seen in the lower extremities, bilaterally.   *See table(s) above for measurements and observations. Electronically signed by Servando Snare MD on 06/15/2019 at 4:43:09 PM.    Final     Labs:  Basic Metabolic  Panel: BMP Latest Ref Rng & Units 07/03/2019 06/26/2019 06/19/2019  Glucose 70 - 99 mg/dL 117(H) 107(H) 117(H)  BUN 8 - 23 mg/dL 8 10 16   Creatinine 0.61 - 1.24 mg/dL 1.14 1.00 1.18  BUN/Creat Ratio 10 - 24 - - -  Sodium 135 - 145 mmol/L 131(L) 127(L) 138  Potassium 3.5 - 5.1 mmol/L 4.0 4.2 3.6  Chloride 98 - 111 mmol/L 95(L) 95(L) 99  CO2 22 - 32 mmol/L 26 22 27   Calcium 8.9 - 10.3 mg/dL 9.3 9.0 9.3    CBC: CBC Latest Ref Rng & Units 07/03/2019 06/26/2019 06/19/2019  WBC 4.0 - 10.5 K/uL 5.1 7.6 6.5  Hemoglobin 13.0 - 17.0 g/dL 12.0(L) 12.3(L) 13.1  Hematocrit 39.0 - 52.0 % 36.8(L) 37.4(L) 40.5  Platelets 150 - 400 K/uL 204 204 234    CBG: No results for input(s): GLUCAP in the last 168 hours.  Brief HPI:   Mark Clayton is a 79 y.o. male with history of colon cancer, chronic systolic CHF, PAF, macular degeneration--legally blind, history of LV thrombus; who was admitted on 06/06/2019 after falling backwards in the yard and striking his head with subsequent left-sided weakness.  CT of head done revealing left hemispheric SDH therefore Xarelto was reversed.  CTA head/neck showed branch occlusion left M3 with trickle flow corresponding to acute infarct in left parietal lobe with surrounding penumbra.  Neurology felt that stroke was embolic due to A. fib with severe Cardizem myopathy and no antithrombotics due to hemorrhage.  Surgical care was recommended by NS and blood pressure treated with Cleviprex.  2D echo showed global hypokinesis with EF of 20% and aortic sclerosis without stenosis.  Patient with oropharyngeal dysphagia and diet was downgraded to dysphagia 2, thin liquids.  Prerenal azotemia treated with IV fluids however he developed cough with upward trending weight therefore was started on IV Lasix for diuresis.  Recommendations were to transition him back to oral when mid back to baseline of 74 kg's.  Neurology recommended resumption of AC in 2 to 3 weeks if his SDH was resolving and  patient clinically stable.  Therapy evaluations done revealing  functional decline with right hemiparesis, expressive greater than receptive aphasia and dysarthria.  CIR was recommended for follow-up therapy   Hospital Course: Mark Clayton was admitted to rehab 06/14/2019 for inpatient therapies to consist of PT, ST and OT at least three hours five days a week. Past admission physiatrist, therapy team and rehab RN have worked together to provide customized collaborative inpatient rehab. BLE Dopplers were negative for DVT and SCDs were initially used for DVT prophylaxis. Blood pressures were monitored on TID basis and has been well controlled.  IV Lasix was transitioned to p.o. route.  Acute on chronic CHF has been compensated and weights are trending downwards.  Hyper kalemia has resolved with supplementation.  Respiratory status is stable.  P.o. intake has been good and he is continent of bowel and bladder.  Bowel program was augmented to help manage constipation.  Follow-up labs showed stable asymptomatic hyponatremia.  Follow-up CT of head showed ICH had resolved and he was cleared to resume Xarelto and Plavix.  He is continent of bowel and bladder.  He has made gains during rehab stay and is at supervision level.  He will continue to receive follow up [place] [place] [facility] [place] by     after discharge  Rehab course: During patient's stay in rehab weekly team conferences were held to monitor patient's progress, set goals and discuss barriers to discharge. At admission, patient required max to total assist with basic ADLs and total assist with mobility. He exhibited mild to moderate pharyngeal dysphagia without signs of aspiration.  He had mild cognitive impairments with mild dysarthria and mild expressive aphasia affect during word finding and occasional semantic and phenomena paraphasias noted.  He  has had improvement in activity tolerance, balance, postural control as well as ability to compensate for deficits.   He has had improvement in functional use RUE  and RLE as well as improvement in awareness.  He requires supervision for most ADL tasks and max assist with LB dressing. He requires min to mod assist with toilet transfers and cues to incorporate RUE with ADLs.   Family education has been completed with daughter and sister on all aspects of safety, care and mobility.   Disposition:  Home   Diet: Dysphagia 3. Cardiac diet.   Special Instructions: 1.  Needs supervision with activity.  Discharge Instructions    Ambulatory referral to Physical Medicine Rehab   Complete by: As directed    1-2 weeks TC appt     Allergies as of 07/08/2019      Reactions   Latex Rash   Tape Rash, Other (See Comments)   USE PAPER      Medication List    TAKE these medications   acetaminophen 325 MG tablet Commonly known as: TYLENOL Take 1-2 tablets (325-650 mg total) by mouth every 4 (four) hours as needed for mild pain.   atorvastatin 80 MG tablet Commonly known as: LIPITOR Take 1 tablet (80 mg total) by mouth daily.   carvedilol 12.5 MG tablet Commonly known as: COREG Take 1 tablet (12.5 mg total) by mouth 2 (two) times daily with a meal.   clopidogrel 75 MG tablet Commonly known as: PLAVIX Take 1 tablet (75 mg total) by mouth daily.   ezetimibe 10 MG tablet Commonly known as: ZETIA Take 1 tablet (10 mg total) by mouth daily.   furosemide 40 MG tablet Commonly known as: LASIX TAKE 1 TABLET BY MOUTH  DAILY   isosorbide mononitrate 30 MG 24 hr tablet  Commonly known as: IMDUR TAKE 1 TABLET BY MOUTH  DAILY   meclizine 12.5 MG tablet Commonly known as: ANTIVERT Take 1 tablet (12.5 mg total) by mouth 2 (two) times daily as needed for dizziness.   pantoprazole 40 MG tablet Commonly known as: PROTONIX TAKE 1 TABLET BY MOUTH  DAILY   polyethylene glycol 17 g packet Commonly known as: MIRALAX / GLYCOLAX Take 17 g by mouth daily.   potassium chloride 10 MEQ tablet Commonly known as:  KLOR-CON Take 1 tablet (10 mEq total) by mouth 2 (two) times daily.   vitamin B-12 100 MCG tablet Commonly known as: CYANOCOBALAMIN Take 100 mcg by mouth daily.     ASK your doctor about these medications   rivaroxaban 20 MG Tabs tablet Commonly known as: XARELTO Take 1 tablet (20 mg total) by mouth daily with supper. Ask about: Which instructions should I use?   rivaroxaban 20 MG Tabs tablet Commonly known as: XARELTO Take 1 tablet (20 mg total) by mouth daily with supper. Ask about: Which instructions should I use?      Follow-up Information    Kirsteins, Luanna Salk, MD Follow up.   Specialty: Physical Medicine and Rehabilitation Why: office will call you with follow up appointment Contact information: [place] [address] [phone]        [name], MD Follow up.   Specialty: Internal Medicine Why: for post hospital follow up Contact information: [place] [address] [phone]        Nelva Bush, MD. Call on 07/10/2019.   Specialty: Cardiology Why: for routine follow up Contact information: [place] [address] [phone]        [facility]. Call on 07/10/2019.   Why: for post stroke follow up Contact information: [address]     [place] [phone]          Signed: Bary Leriche 07/11/2019, 1:55 PM

## 2019-07-07 NOTE — Progress Notes (Signed)
Occupational Therapy Session Note  Patient Details  Name: Mark Clayton MRN: [id number] Date of Birth: Jun 15, 1940  Today's Date: 07/07/2019 OT Individual Time: [id number] OT Individual Time Calculation (min): 41 min   Short Term Goals: Week 3:  OT Short Term Goal 1 (Week 3): STG= LTG secondary to ELOS  Skilled Therapeutic Interventions/Progress Updates:    Pt greeted in bed, c/o that he had medicine to move his bowels frequently today and declined OOB tx. OT encouraged OOB toileting with pt declining. He was agreeable to bedlevel tx to work on his Rt side. We listened to meaningful music and guided pt through exercises using active and active assist techniques to incorporate Rt UE. Pt becoming teary at times because the music he picked out reminded him of his spouse, Arbie Cookey. Pt needed rest breaks in between songs due to pt c/o feeling "wore out." At end of session pt remained in bed with all needs within reach and bed alarm set.    Therapy Documentation Precautions:  Precautions Precautions: Fall Precaution Comments: R hemiparesis Restrictions Weight Bearing Restrictions: No Vital Signs: Therapy Vitals Temp: 98.3 F (36.8 C) Pulse Rate: (!) 59 Resp: 17 BP: (!) 93/47 Patient Position (if appropriate): Lying Oxygen Therapy SpO2: 98 % O2 Device: Room Air Pain: Pt reported being premedicated for pain  ADL: ADL Eating: Independent Grooming: Modified independent Where Assessed-Grooming: Sitting at sink Upper Body Bathing: Setup Where Assessed-Upper Body Bathing: Sitting at sink Lower Body Bathing: Setup Where Assessed-Lower Body Bathing: Sitting at sink Upper Body Dressing: Minimal cueing Where Assessed-Upper Body Dressing: Edge of bed Lower Body Dressing: Maximal assistance Where Assessed-Lower Body Dressing: Edge of bed Toileting: Moderate assistance Where Assessed-Toileting: Glass blower/designer: Moderate assistance Toilet Transfer Method: Squat pivot Toilet Transfer  Equipment: Energy manager: Moderate assistance Tub/Shower Transfer Method: Squat pivot Tub/Shower Equipment: Transfer tub bench     Therapy/Group: Individual Therapy  Corey Laski A Sabel Hornbeck 07/07/2019, 4:12 PM

## 2019-07-07 NOTE — Progress Notes (Signed)
Recreational Therapy Discharge Summary Patient Details  Name: Mark Clayton MRN: [id number] Date of Birth: [birth year] Today's Date: 07/07/2019  Long term goals set: 2  Long term goals met: 2  Comments on progress toward goals: Pt has made great progress during LOS and is ready for discharge home with family to provide 24 hour supervision/assistance.  TR sessions focused on activity tolerance, dynamic sitting/standing balance, RUE use, community reintegration.  Pt is discharging at overall Min assist level for simple standing leisure tasks and min assist w/c level for community reintegration.    Reasons for discharge: discharge from hospital  Patient/family agrees with progress made and goals achieved: Yes  Mark Clayton 07/07/2019, 8:06 AM

## 2019-07-07 NOTE — Plan of Care (Signed)
  Problem: RH Dressing Goal: LTG Patient will perform lower body dressing w/assist (OT) Description: LTG: Patient will perform lower body dressing with assist, with/without cues in positioning using equipment (OT) Outcome: Not Met (add Reason) Note: Goal not met as pt still requires Max A for LB dressing    Problem: RH Balance Goal: LTG: Patient will maintain dynamic sitting balance (OT) Description: LTG:  Patient will maintain dynamic sitting balance with assistance during activities of daily living (OT) Outcome: Completed/Met Goal: LTG Patient will maintain dynamic standing with ADLs (OT) Description: LTG:  Patient will maintain dynamic standing balance with assist during activities of daily living (OT)  Outcome: Completed/Met   Problem: Sit to Stand Goal: LTG:  Patient will perform sit to stand in prep for activites of daily living with assistance level (OT) Description: LTG:  Patient will perform sit to stand in prep for activites of daily living with assistance level (OT) Outcome: Completed/Met   Problem: RH Eating Goal: LTG Patient will perform eating w/assist, cues/equip (OT) Description: LTG: Patient will perform eating with assist, with/without cues using equipment (OT) Outcome: Completed/Met   Problem: RH Grooming Goal: LTG Patient will perform grooming w/assist,cues/equip (OT) Description: LTG: Patient will perform grooming with assist, with/without cues using equipment (OT) Outcome: Completed/Met   Problem: RH Bathing Goal: LTG Patient will bathe all body parts with assist levels (OT) Description: LTG: Patient will bathe all body parts with assist levels (OT) Outcome: Completed/Met   Problem: RH Dressing Goal: LTG Patient will perform upper body dressing (OT) Description: LTG Patient will perform upper body dressing with assist, with/without cues (OT). Outcome: Completed/Met   Problem: RH Toileting Goal: LTG Patient will perform toileting task (3/3 steps) with  assistance level (OT) Description: LTG: Patient will perform toileting task (3/3 steps) with assistance level (OT)  Outcome: Completed/Met   Problem: RH Functional Use of Upper Extremity Goal: LTG Patient will use RT/LT upper extremity as a (OT) Description: LTG: Patient will use right/left upper extremity as a stabilizer/gross assist/diminished/nondominant/dominant level with assist, with/without cues during functional activity (OT) Outcome: Completed/Met   Problem: RH Toilet Transfers Goal: LTG Patient will perform toilet transfers w/assist (OT) Description: LTG: Patient will perform toilet transfers with assist, with/without cues using equipment (OT) Outcome: Completed/Met   Problem: RH Tub/Shower Transfers Goal: LTG Patient will perform tub/shower transfers w/assist (OT) Description: LTG: Patient will perform tub/shower transfers with assist, with/without cues using equipment (OT) Outcome: Completed/Met

## 2019-07-08 ENCOUNTER — Other Ambulatory Visit: Payer: Self-pay | Admitting: Physical Medicine and Rehabilitation

## 2019-07-08 MED ORDER — RIVAROXABAN 20 MG PO TABS: 20.0000 mg | ORAL_TABLET | Freq: Every day | ORAL | 3 refills | Status: DC

## 2019-07-10 DIAGNOSIS — I4819 Other persistent atrial fibrillation: Secondary | ICD-10-CM | POA: Diagnosis not present

## 2019-07-10 DIAGNOSIS — I10 Essential (primary) hypertension: Secondary | ICD-10-CM | POA: Diagnosis not present

## 2019-07-10 DIAGNOSIS — I25119 Atherosclerotic heart disease of native coronary artery with unspecified angina pectoris: Secondary | ICD-10-CM | POA: Diagnosis not present

## 2019-07-10 DIAGNOSIS — I5043 Acute on chronic combined systolic (congestive) and diastolic (congestive) heart failure: Secondary | ICD-10-CM | POA: Diagnosis not present

## 2019-07-10 DIAGNOSIS — I639 Cerebral infarction, unspecified: Secondary | ICD-10-CM | POA: Diagnosis not present

## 2019-07-11 ENCOUNTER — Ambulatory Visit (INDEPENDENT_AMBULATORY_CARE_PROVIDER_SITE_OTHER): Payer: Medicare Other | Admitting: Internal Medicine

## 2019-07-11 ENCOUNTER — Encounter: Payer: Self-pay | Admitting: Internal Medicine

## 2019-07-11 ENCOUNTER — Telehealth: Payer: Self-pay | Admitting: *Deleted

## 2019-07-11 DIAGNOSIS — I69351 Hemiplegia and hemiparesis following cerebral infarction affecting right dominant side: Secondary | ICD-10-CM | POA: Diagnosis not present

## 2019-07-11 DIAGNOSIS — S06350D Traumatic hemorrhage of left cerebrum without loss of consciousness, subsequent encounter: Secondary | ICD-10-CM | POA: Diagnosis not present

## 2019-07-11 DIAGNOSIS — I6340 Cerebral infarction due to embolism of unspecified cerebral artery: Secondary | ICD-10-CM

## 2019-07-11 DIAGNOSIS — S06350A Traumatic hemorrhage of left cerebrum without loss of consciousness, initial encounter: Secondary | ICD-10-CM

## 2019-07-11 NOTE — Assessment & Plan Note (Signed)
Ordered PT/OT with information relayed by daughter about home health agency. Overall function is not good at this time and unable to leave home. Unable to use right leg at this time.

## 2019-07-11 NOTE — Telephone Encounter (Signed)
Transitional Care call. Spoke to daughter [name], [phone] Confirmed appointment, confirmed address,new patient packet mailed   1. Are you/is patient experiencing any problems since coming home? No, feeling is returning to right side.  Patient and family eager for home health.  Are there any questions regarding any aspect of care? No  2. Are there any questions regarding medications administration/dosing? No  Are meds being taken as prescribed? Yes Patient should review meds with caller to confirm  3. Have there been any falls? No  4. Has Home Health been to the house and/or have they contacted you? No, Dr. Sharlet Salina is assisting along with social work on [facility], rehab unit If not, have you tried to contact them? Can we help you contact them?  5. Are bowels and bladder emptying properly? Yes  Are there any unexpected incontinence issues? No  If applicable, is patient following bowel/bladder programs?  6. Any fevers, problems with breathing, unexpected pain? No  7. Are there any skin problems or new areas of breakdown? No  8. Has the patient/family member arranged specialty MD follow up (ie cardiology/neurology/renal/surgical/etc)? Yes   Can we help arrange? No  9. Does the patient need any other services or support that we can help arrange? No  10. Are caregivers following through as expected in assisting the patient? Yes  11. Has the patient quit smoking, drinking alcohol, or using drugs as recommended? Patient does not do these things  Appointment time, arrive time and who it is with here [address] [phone]

## 2019-07-11 NOTE — Assessment & Plan Note (Signed)
Stable to resolving on discharge.

## 2019-07-11 NOTE — Progress Notes (Signed)
Virtual Visit via Audio Note  I connected with [name] on 07/11/19 at  8:20 AM EDT by an audio only enabled telemedicine application and verified that I am speaking with the correct person using two identifiers.  The patient and the provider were at separate locations throughout the entire encounter.   I discussed the limitations of evaluation and management by telemedicine and the availability of in person appointments. The patient expressed understanding and agreed to proceed. The patient and the provider were the only parties present for the visit unless noted in HPI below.  History of Present Illness: The patient is a 79 y.o. man with visit for hospital follow up. Still having right foot weakness. Right hand is improving some. Started about a month ago with hospital stay and then subsequent rehab stay. PT/OT initiation at home has been delayed by availability and per his daughter (helps to provide history) they need some information faxed to them. Appetite is good since leaving hospital. Denies chest pains or SOB. Denies diarrhea/constipation. Has no falls. Overall it is stable to gradually improving.   Observations/Objective: Appearance: normal, breathing appears normal, unable to assess neuro exam given modality, casual grooming, abdomen does not appear distended, memory normal, mental status is A and O times 3  Assessment and Plan: See problem oriented charting  Follow Up Instructions: Ordered PT/OT needs visit in 1 month in office  [facility] home health, PT/OT  Visit time 12 minutes in non-face to face communication with patient and coordination of care.   I discussed the assessment and treatment plan with the patient. The patient was provided an opportunity to ask questions and all were answered. The patient agreed with the plan and demonstrated an understanding of the instructions.   The patient was advised to call back or seek an in-person evaluation if the symptoms worsen or  if the condition fails to improve as anticipated.  [name], MD

## 2019-07-12 ENCOUNTER — Other Ambulatory Visit: Payer: Self-pay

## 2019-07-12 ENCOUNTER — Encounter (INDEPENDENT_AMBULATORY_CARE_PROVIDER_SITE_OTHER): Payer: Self-pay | Admitting: Ophthalmology

## 2019-07-12 ENCOUNTER — Encounter (INDEPENDENT_AMBULATORY_CARE_PROVIDER_SITE_OTHER): Payer: Self-pay

## 2019-07-12 ENCOUNTER — Ambulatory Visit (INDEPENDENT_AMBULATORY_CARE_PROVIDER_SITE_OTHER): Payer: Medicare Other | Admitting: Ophthalmology

## 2019-07-12 DIAGNOSIS — H353211 Exudative age-related macular degeneration, right eye, with active choroidal neovascularization: Secondary | ICD-10-CM | POA: Insufficient documentation

## 2019-07-12 DIAGNOSIS — H353212 Exudative age-related macular degeneration, right eye, with inactive choroidal neovascularization: Secondary | ICD-10-CM | POA: Insufficient documentation

## 2019-07-12 DIAGNOSIS — H353221 Exudative age-related macular degeneration, left eye, with active choroidal neovascularization: Secondary | ICD-10-CM

## 2019-07-12 DIAGNOSIS — H353122 Nonexudative age-related macular degeneration, left eye, intermediate dry stage: Secondary | ICD-10-CM | POA: Insufficient documentation

## 2019-07-12 HISTORY — DX: Exudative age-related macular degeneration, right eye, with inactive choroidal neovascularization: H35.3212

## 2019-07-12 HISTORY — DX: Exudative age-related macular degeneration, left eye, with active choroidal neovascularization: H35.3221

## 2019-07-12 NOTE — Progress Notes (Signed)
07/12/2019     CHIEF COMPLAINT Patient presents for Retina Follow Up   HISTORY OF PRESENT ILLNESS: Mark Clayton is a 79 y.o. male who presents to the clinic today for:   HPI    Retina Follow Up    Patient presents with  Wet AMD.  In right eye.  Severity is moderate.  Since onset it is stable.  I, the attending physician,  performed the HPI with the patient and updated documentation appropriately.          Comments    6 Week AMD f\u OD. Possible Eylea OD. OCT  Pt recently had a stroke and was in the hospital 31 days. Pt states he noticed a decrease then increase in vision during this time.       Last edited by Tilda Franco on 07/12/2019 10:20 AM. (History)      Referring physician: [name], MD [place],  [place] [address]  HISTORICAL INFORMATION:   Selected notes from the MEDICAL RECORD NUMBER    Lab Results  Component Value Date   HGBA1C 6.1 (H) 06/07/2019     CURRENT MEDICATIONS: No current outpatient medications on file. (Ophthalmic Drugs)   No current facility-administered medications for this visit. (Ophthalmic Drugs)   Current Outpatient Medications (Other)  Medication Sig  . acetaminophen (TYLENOL) 325 MG tablet Take 1-2 tablets (325-650 mg total) by mouth every 4 (four) hours as needed for mild pain.  . carvedilol (COREG) 12.5 MG tablet Take 1 tablet (12.5 mg total) by mouth 2 (two) times daily with a meal.  . clopidogrel (PLAVIX) 75 MG tablet Take 1 tablet (75 mg total) by mouth daily.  Marland Kitchen ezetimibe (ZETIA) 10 MG tablet Take 1 tablet (10 mg total) by mouth daily.  . furosemide (LASIX) 40 MG tablet TAKE 1 TABLET BY MOUTH  DAILY  . isosorbide mononitrate (IMDUR) 30 MG 24 hr tablet TAKE 1 TABLET BY MOUTH  DAILY (Patient taking differently: Take 30 mg by mouth daily. )  . losartan (COZAAR) 50 MG tablet Take 50 mg by mouth daily.  . meclizine (ANTIVERT) 12.5 MG tablet Take 1 tablet (12.5 mg total) by mouth 2 (two) times daily  as needed for dizziness.  . nitroGLYCERIN (NITROSTAT) 0.4 MG SL tablet   . pantoprazole (PROTONIX) 40 MG tablet TAKE 1 TABLET BY MOUTH  DAILY (Patient taking differently: Take 40 mg by mouth daily. )  . polyethylene glycol (MIRALAX / GLYCOLAX) 17 g packet Take 17 g by mouth daily.  . potassium chloride (KLOR-CON) 10 MEQ tablet Take 1 tablet (10 mEq total) by mouth 2 (two) times daily.  . potassium chloride (KLOR-CON) 10 MEQ tablet Take 10 mEq by mouth 2 (two) times daily.  . rivaroxaban (XARELTO) 20 MG TABS tablet Take 1 tablet (20 mg total) by mouth daily with supper.  . rivaroxaban (XARELTO) 20 MG TABS tablet Take 1 tablet (20 mg total) by mouth daily with supper.  . vitamin B-12 (CYANOCOBALAMIN) 100 MCG tablet Take 100 mcg by mouth daily.   Marland Kitchen atorvastatin (LIPITOR) 80 MG tablet Take 1 tablet (80 mg total) by mouth daily.   No current facility-administered medications for this visit. (Other)      REVIEW OF SYSTEMS:    ALLERGIES Allergies  Allergen Reactions  . Latex Rash  . Tape Rash and Other (See Comments)    USE PAPER    PAST MEDICAL HISTORY Past Medical History:  Diagnosis Date  . AICD (automatic cardioverter/defibrillator)  present 01/17/2003   Medtronic Maximo 7232CX ICD, serial [id number]  . Anemia 02-06-11   takes oral iron  . Arthritis    hands, knees  . CAD (coronary artery disease) 2003   a. h/o MI and CABG in 2003. b. s/p DES to SVG-RPDA-RPLB in 08/2014.  Marland Kitchen Cancer of sigmoid colon ([place]) 2012   a. s/p colon surgery.  . Carotid bruit   . Chronic systolic CHF (congestive heart failure) (HCC)    a. EF 20% in 2014; b. 08/2017 Echo: EF 20-25%, diff HK, Gr3 DD. Triv AI. Mod MR. Sev dil LA. Mildly dil RV w/ mildly reduced RV fxn. Mildly dil RA. Mod TR. PASP 19mHg.  Marland Kitchen Cough   . GERD (gastroesophageal reflux disease) 02-06-11  . HTN (hypertension)   . Hyperlipidemia   . Ischemic cardiomyopathy    a. EF 20% in 2014. (Master study EF >20%); b. 08/2017 Echo: EF 20-25%,  diff HK. Gr3 DD.  . LV (left ventricular) mural thrombus    a. 12/2012 Echo: EF 20% with mural thrombus No evidence of thrombus on 08/2017 echo.  . Macular degeneration   . Myocardial infarct ([place])    2003  . PAF (paroxysmal atrial fibrillation) (HCC)    a. CHA2DS2VASc = 5-->Xarelto/Tikosyn.   Past Surgical History:  Procedure Laterality Date  . CARDIAC CATHETERIZATION N/A 09/20/2014   Procedure: Left Heart Cath and Coronary Angiography;  Surgeon: Jettie Booze, MD; LAD 95%, D1 100%, CFX liner percent, OM 200%, OM 390%, RCA 90%, LIMA-LAD okay, SVG-OM 2-OM 3 minimal disease, SVG-RPDA-RPLB 100% between the RPDA and RPL     . CARDIAC CATHETERIZATION N/A 09/20/2014   Procedure: Coronary Stent Intervention;  Surgeon: Jettie Booze, MD; Synergy DES 4 x 24 mm reducing the stenosis to 5%   . CARDIAC DEFIBRILLATOR PLACEMENT  01/17/03   6949 lead. Medtronic. remote-no; with later revision  . CARDIOVERSION N/A 05/22/2016   Procedure: CARDIOVERSION;  Surgeon: Lelon Perla, MD;  Location: [facility] ENDOSCOPY;  Service: Cardiovascular;  Laterality: N/A;  . CARDIOVERSION N/A 08/10/2017   Procedure: CARDIOVERSION;  Surgeon: Pixie Casino, MD;  Location: [facility] ENDOSCOPY;  Service: Cardiovascular;  Laterality: N/A;  . CATARACT EXTRACTION W/ INTRAOCULAR LENS  IMPLANT, BILATERAL Bilateral June/-July 2009   Dr. Katy Fitch  . CATARACT EXTRACTION W/PHACO Bilateral 2010   Dr. Katy Fitch  . COLON RESECTION  02/09/2011   Procedure: LAPAROSCOPIC SIGMOID COLON RESECTION;  Surgeon: Pedro Earls, MD;  Location: [facility] ORS;  Service: General;  Laterality: N/A;  Laparoscopic Assisted Sigmoid Colectomy  . COLON SURGERY    . COLONOSCOPY  08/31/2011   Procedure: COLONOSCOPY;  Surgeon: Jerene Bears, MD;  Location: [facility] ENDOSCOPY;  Service: Gastroenterology;  Laterality: N/A;  . COLONOSCOPY N/A 09/05/2012   Procedure: COLONOSCOPY;  Surgeon: Jerene Bears, MD;  Location: [facility] ENDOSCOPY;  Service: Gastroenterology;  Laterality: N/A;  .  COLONOSCOPY N/A 04/18/2013   Procedure: COLONOSCOPY;  Surgeon: Jerene Bears, MD;  Location: [facility] ENDOSCOPY;  Service: Gastroenterology;  Laterality: N/A;  . COLONOSCOPY N/A 04/09/2014   Procedure: COLONOSCOPY;  Surgeon: Jerene Bears, MD;  Location: [facility] ENDOSCOPY;  Service: Gastroenterology;  Laterality: N/A;  . COLONOSCOPY WITH PROPOFOL N/A 05/03/2017   Procedure: COLONOSCOPY WITH PROPOFOL;  Surgeon: Jerene Bears, MD;  Location: [facility] ENDOSCOPY;  Service: Gastroenterology;  Laterality: N/A;  . CORONARY ANGIOPLASTY    . CORONARY ARTERY BYPASS GRAFT  01/2002   LIMA-LAD, SVG-OM 2-OM 3, SVG-RPDA-RPLB  . CORONARY STENT INTERVENTION N/A 11/22/2018  Procedure: CORONARY STENT INTERVENTION;  Surgeon: Nelva Bush, MD;  Location: [place] CV LAB;  Service: Cardiovascular;  Laterality: N/A;  SVG - RCA  . ICD GENERATOR CHANGE  2010   Medtronic Virtuoso II VR ICD  . ICD GENERATOR CHANGEOUT N/A 12/07/2018   Procedure: ICD GENERATOR CHANGEOUT;  Surgeon: Deboraha Sprang, MD;  Location: [place] CV LAB;  Service: Cardiovascular;  Laterality: N/A;  . INGUINAL HERNIA REPAIR Right 2000's X 2  . LAPAROSCOPIC RIGHT HEMI COLECTOMY N/A 11/04/2012   Procedure: LAPAROSCOPIC RIGHT HEMI COLECTOMY;  Surgeon: Pedro Earls, MD;  Location: [facility] ORS;  Service: General;  Laterality: N/A;  . LEFT HEART CATH AND CORS/GRAFTS ANGIOGRAPHY Left 11/22/2018   Procedure: LEFT HEART CATH AND CORS/GRAFTS ANGIOGRAPHY;  Surgeon: Nelva Bush, MD;  Location: [place] CV LAB;  Service: Cardiovascular;  Laterality: Left;  . TONSILLECTOMY  ~ 1950    FAMILY HISTORY Family History  Problem Relation Age of Onset  . Hypertension Father   . Hyperlipidemia Father   . Heart disease Father   . Prostate cancer Father   . Alzheimer's disease Mother   . Hypertension Sister   . Hyperlipidemia Sister   . Colon cancer Neg Hx   . Esophageal cancer Neg Hx   . Rectal cancer Neg Hx   . Stomach cancer Neg Hx     SOCIAL HISTORY Social  History   Tobacco Use  . Smoking status: Never Smoker  . Smokeless tobacco: Never Used  Substance Use Topics  . Alcohol use: Yes    Alcohol/week: 2.0 standard drinks    Types: 2 Cans of beer per week  . Drug use: No         OPHTHALMIC EXAM: Base Eye Exam    Visual Acuity (Snellen - Linear)      Right Left   Dist [place] 20/200 E Card @ 4'   Dist ph [place] 20/100 -1 NI       Tonometry (Tonopen, 10:29 AM)      Right Left   Pressure 12 12       Pupils      Pupils Dark Light Shape React APD   Right PERRL 3 2 Round Slow None   Left PERRL 3 2 Irregular Slow None       Visual Fields (Counting fingers)      Left Right    Full Full       Neuro/Psych    Oriented x3: Yes   Mood/Affect: Normal       Dilation    Right eye: 1.0% Mydriacyl, 2.5% Phenylephrine @ 10:29 AM          IMAGING AND PROCEDURES  Imaging and Procedures for @TODAY @           ASSESSMENT/PLAN:  No problem-specific Assessment & Plan notes found for this encounter.      ICD-10-CM   1. Exudative age-related macular degeneration of left eye with active choroidal neovascularization (HCC)  H35.3221 OCT, Retina - OU - Both Eyes  2. Exudative age-related macular degeneration of right eye with active choroidal neovascularization (HCC)  H35.3211 OCT, Retina - OU - Both Eyes  3. Exudative age-related macular degeneration, right eye, with inactive choroidal neovascularization ([place])  H35.3212   4. Intermediate stage nonexudative age-related macular degeneration of left eye  H35.3122     1.  2.  3.  Ophthalmic Meds Ordered this visit:  No orders of the defined types were placed in this encounter.  No follow-ups on file.  There are no Patient Instructions on file for this visit.   Explained the diagnoses, plan, and follow up with the patient and they expressed understanding.  Patient expressed understanding of the importance of proper follow up care.   Clent Demark Rankin M.D. Diseases & Surgery  of the Retina and Vitreous Retina & Diabetic [place] @TODAY @     Abbreviations: M myopia (nearsighted); A astigmatism; H hyperopia (farsighted); P presbyopia; Mrx spectacle prescription;  CTL contact lenses; OD right eye; OS left eye; OU both eyes  XT exotropia; ET esotropia; PEK punctate epithelial keratitis; PEE punctate epithelial erosions; DES dry eye syndrome; MGD meibomian gland dysfunction; ATs artificial tears; PFAT's preservative free artificial tears; [place] nuclear sclerotic cataract; PSC posterior subcapsular cataract; ERM epi-retinal membrane; PVD posterior vitreous detachment; RD retinal detachment; DM diabetes mellitus; DR diabetic retinopathy; NPDR non-proliferative diabetic retinopathy; PDR proliferative diabetic retinopathy; CSME clinically significant macular edema; DME diabetic macular edema; dbh dot blot hemorrhages; CWS cotton wool spot; POAG primary open angle glaucoma; C/D cup-to-disc ratio; HVF humphrey visual field; GVF goldmann visual field; OCT optical coherence tomography; IOP intraocular pressure; BRVO Branch retinal vein occlusion; CRVO central retinal vein occlusion; CRAO central retinal artery occlusion; BRAO branch retinal artery occlusion; RT retinal tear; SB scleral buckle; PPV pars plana vitrectomy; VH Vitreous hemorrhage; PRP panretinal laser photocoagulation; IVK intravitreal kenalog; VMT vitreomacular traction; MH Macular hole;  NVD neovascularization of the disc; NVE neovascularization elsewhere; AREDS age related eye disease study; ARMD age related macular degeneration; POAG primary open angle glaucoma; EBMD epithelial/anterior basement membrane dystrophy; ACIOL anterior chamber intraocular lens; IOL intraocular lens; PCIOL posterior chamber intraocular lens; Phaco/IOL phacoemulsification with intraocular lens placement; [place] photorefractive keratectomy; LASIK laser assisted in situ keratomileusis; HTN hypertension; DM diabetes mellitus; COPD chronic obstructive  pulmonary disease

## 2019-07-13 ENCOUNTER — Telehealth: Payer: Self-pay | Admitting: Internal Medicine

## 2019-07-13 NOTE — Telephone Encounter (Signed)
   Patients daughter [name] calling , request a call to discuss "wording" on home health forms be updated Phone [phone]

## 2019-07-14 ENCOUNTER — Other Ambulatory Visit: Payer: Medicare Other

## 2019-07-14 ENCOUNTER — Encounter: Payer: Self-pay | Admitting: Internal Medicine

## 2019-07-14 ENCOUNTER — Telehealth (INDEPENDENT_AMBULATORY_CARE_PROVIDER_SITE_OTHER): Payer: Medicare Other | Admitting: Internal Medicine

## 2019-07-14 ENCOUNTER — Ambulatory Visit: Payer: Medicare Other | Admitting: Cardiology

## 2019-07-14 ENCOUNTER — Telehealth: Payer: Self-pay | Admitting: *Deleted

## 2019-07-14 DIAGNOSIS — S06350D Traumatic hemorrhage of left cerebrum without loss of consciousness, subsequent encounter: Secondary | ICD-10-CM | POA: Diagnosis not present

## 2019-07-14 NOTE — Telephone Encounter (Signed)
Called pt daughter Volney Presser) she states she actually had a virtual appt w/MD this am concerning issues. Closing encounter.Marland KitchenJohny Chess

## 2019-07-14 NOTE — Assessment & Plan Note (Signed)
Needs to start home health to help with stability and weakness. Advised we will re-fax today with face to face now in place.

## 2019-07-14 NOTE — Progress Notes (Signed)
Virtual Visit via Video Note  I connected with Mark Clayton on 07/14/19 at  8:40 AM EDT by a video enabled telemedicine application and verified that I am speaking with the correct person using two identifiers.  The patient and the provider were at separate locations throughout the entire encounter.   I discussed the limitations of evaluation and management by telemedicine and the availability of in person appointments. The patient expressed understanding and agreed to proceed. The patient and the provider were the only parties present for the visit unless noted in HPI below.  History of Present Illness: The patient is a 79 y.o. man with visit for post-hospital visit for stroke. He has not started home health as there were issues with availability. He is now able to take about 2-3 steps on the right foot. He is anxious to start home health. Denies fevers or chills. Denies weight change. Denies chest pains or SOB. Denies diarrhea or constipation. No falls and they are being very careful. Daughter is staying with him.  Observations/Objective: Appearance: normal, breathing appears normal, casual grooming, abdomen does not appear distended, throat normal, memory normal, mental status is A and O times 3. Weakness right leg.   Assessment and Plan: See problem oriented charting  Follow Up Instructions: [facility] referral redone.   I discussed the assessment and treatment plan with the patient. The patient was provided an opportunity to ask questions and all were answered. The patient agreed with the plan and demonstrated an understanding of the instructions.   The patient was advised to call back or seek an in-person evaluation if the symptoms worsen or if the condition fails to improve as anticipated.  [name], MD

## 2019-07-14 NOTE — Telephone Encounter (Signed)
Followed up with [facility] PT, OT, SLP request for services.  Unable to meet request at discharge of 07/08/19 due to staffing issues. [facility] is now able to provide for requested services. Script for [facility] PT, OT and SLP received and discharge summary/notes. [facility] confirmed all set to initiate requested services.

## 2019-07-16 DIAGNOSIS — I25119 Atherosclerotic heart disease of native coronary artery with unspecified angina pectoris: Secondary | ICD-10-CM | POA: Diagnosis not present

## 2019-07-16 DIAGNOSIS — R1312 Dysphagia, oropharyngeal phase: Secondary | ICD-10-CM | POA: Diagnosis not present

## 2019-07-16 DIAGNOSIS — H3530 Unspecified macular degeneration: Secondary | ICD-10-CM | POA: Diagnosis not present

## 2019-07-16 DIAGNOSIS — E876 Hypokalemia: Secondary | ICD-10-CM | POA: Diagnosis not present

## 2019-07-16 DIAGNOSIS — I5043 Acute on chronic combined systolic (congestive) and diastolic (congestive) heart failure: Secondary | ICD-10-CM | POA: Diagnosis not present

## 2019-07-16 DIAGNOSIS — H548 Legal blindness, as defined in USA: Secondary | ICD-10-CM | POA: Diagnosis not present

## 2019-07-16 DIAGNOSIS — I110 Hypertensive heart disease with heart failure: Secondary | ICD-10-CM | POA: Diagnosis not present

## 2019-07-16 DIAGNOSIS — I69320 Aphasia following cerebral infarction: Secondary | ICD-10-CM | POA: Diagnosis not present

## 2019-07-16 DIAGNOSIS — I69322 Dysarthria following cerebral infarction: Secondary | ICD-10-CM | POA: Diagnosis not present

## 2019-07-16 DIAGNOSIS — Z85038 Personal history of other malignant neoplasm of large intestine: Secondary | ICD-10-CM | POA: Diagnosis not present

## 2019-07-16 DIAGNOSIS — I69391 Dysphagia following cerebral infarction: Secondary | ICD-10-CM | POA: Diagnosis not present

## 2019-07-16 DIAGNOSIS — I69351 Hemiplegia and hemiparesis following cerebral infarction affecting right dominant side: Secondary | ICD-10-CM | POA: Diagnosis not present

## 2019-07-16 DIAGNOSIS — Z7901 Long term (current) use of anticoagulants: Secondary | ICD-10-CM | POA: Diagnosis not present

## 2019-07-16 DIAGNOSIS — Z9181 History of falling: Secondary | ICD-10-CM | POA: Diagnosis not present

## 2019-07-16 DIAGNOSIS — I255 Ischemic cardiomyopathy: Secondary | ICD-10-CM | POA: Diagnosis not present

## 2019-07-16 DIAGNOSIS — Z7902 Long term (current) use of antithrombotics/antiplatelets: Secondary | ICD-10-CM | POA: Diagnosis not present

## 2019-07-16 DIAGNOSIS — Z87440 Personal history of urinary (tract) infections: Secondary | ICD-10-CM | POA: Diagnosis not present

## 2019-07-16 DIAGNOSIS — I480 Paroxysmal atrial fibrillation: Secondary | ICD-10-CM | POA: Diagnosis not present

## 2019-07-16 NOTE — Progress Notes (Signed)
Pt unable to remain in office to complete the visit, did not see physician, due to recent stroke, discharged one day prior, now in pain.

## 2019-07-16 NOTE — Chronic Care Management (AMB) (Signed)
Chronic Care Management Pharmacy  Name: Mark Clayton  MRN: [id number] DOB: 06/09/1940  Chief Complaint/ HPI  Mark Clayton,  79 y.o. , male presents for their Initial CCM visit with the clinical pharmacist via telephone due to COVID-19 Pandemic.  PCP : [name], MD  Their chronic conditions include: HTN, CHF, Afib w/ hx of embolic stroke, CAD  Office Visits: 07/14/19 Dr Sharlet Salina VV: hospital f/u for [place]. HH referral for stability/weakness.  07/11/19 Dr Sharlet Salina VV: overall poor function s/p ICH. OT/PT ordered.  Consult Visit: 06/06/19 - 06/14/19 hospital admission: [place] s/p fall. Given [place]. Pt found to have acute L MCA infarct as well. Holding clopidogrel and Xarelto. Stopped dofetilide.  06/14/19 - 07/08/19 rehab admission: ICH s/p fall. Cleared to resume Plavix and Xarelto after repeat CT. Mild cognitive impairment, dysphagia. Needs supervision with activity.  04/14/19 Dr Marlou Porch (cardiology): added Zetia to achieve LDL goal < 70.  03/28/19 Dr Caryl Comes (EP): failed cardioversion. Failed amiodarone due weakness/SOB. Now on dofetilide since 2019.  Medications: Outpatient Encounter Medications as of 07/17/2019  Medication Sig Note  . acetaminophen (TYLENOL) 325 MG tablet Take 1-2 tablets (325-650 mg total) by mouth every 4 (four) hours as needed for mild pain.   . carvedilol (COREG) 12.5 MG tablet Take 1 tablet (12.5 mg total) by mouth 2 (two) times daily with a meal.   . clopidogrel (PLAVIX) 75 MG tablet Take 1 tablet (75 mg total) by mouth daily.   Marland Kitchen ezetimibe (ZETIA) 10 MG tablet Take 1 tablet (10 mg total) by mouth daily.   . furosemide (LASIX) 40 MG tablet TAKE 1 TABLET BY MOUTH  DAILY   . isosorbide mononitrate (IMDUR) 30 MG 24 hr tablet TAKE 1 TABLET BY MOUTH  DAILY   . meclizine (ANTIVERT) 12.5 MG tablet Take 1 tablet (12.5 mg total) by mouth 2 (two) times daily as needed for dizziness.   . nitroGLYCERIN (NITROSTAT) 0.4 MG SL tablet    . pantoprazole (PROTONIX) 40  MG tablet TAKE 1 TABLET BY MOUTH  DAILY   . polyethylene glycol (MIRALAX / GLYCOLAX) 17 g packet Take 17 g by mouth daily.   . potassium chloride (KLOR-CON) 10 MEQ tablet Take 1 tablet (10 mEq total) by mouth 2 (two) times daily.   . potassium chloride (KLOR-CON) 10 MEQ tablet Take 10 mEq by mouth 2 (two) times daily.   . rivaroxaban (XARELTO) 20 MG TABS tablet Take 1 tablet (20 mg total) by mouth daily with supper.   . rivaroxaban (XARELTO) 20 MG TABS tablet Take 1 tablet (20 mg total) by mouth daily with supper.   . vitamin B-12 (CYANOCOBALAMIN) 100 MCG tablet Take 100 mcg by mouth daily.    Marland Kitchen atorvastatin (LIPITOR) 80 MG tablet Take 1 tablet (80 mg total) by mouth daily.   Marland Kitchen losartan (COZAAR) 50 MG tablet Take 50 mg by mouth daily. 07/17/2019: Stopped in hospital 3/17 per d/c summary   No facility-administered encounter medications on file as of 07/17/2019.     Current Diagnosis/Assessment:  SDOH Interventions     Most Recent Value  SDOH Interventions  SDOH Interventions for the Following Domains  Financial Strain  Financial Strain Interventions  Other (Comment) [can pursue Xarelto assistance during coverage gap]      Goals Addressed            This Visit's Progress   . Atrial Fibrillation: minimize drug costs       CARE PLAN ENTRY  Current Barriers:  .  Chronic Disease Management support, education, and care coordination needs related to Atrial Fibrillation  Pharmacist Clinical Goal(s):  Marland Kitchen Over the next 90 days, patient will work with PharmD and providers towards optimized Afib therapy  Interventions: . Comprehensive medication review performed. . Discussed coverage gap/"donut hole" and cost issues related to Xarelto. [facility] program: Xarelto for $85/month during coverage gap  Patient Self Care Activities:  . Self administers medications as prescribed, Calls pharmacy for medication refills, and Calls provider office for new concerns or questions  Initial goal  documentation    . Blood pressure goal < 130/80       CARE PLAN ENTRY (see longitudinal plan of care for additional care plan information)  Current Barriers:  . Controlled Hypertension, complicated by CHF, Afib, CAD . Current antihypertensive regimen: carvedilol 12.5 mg BID, isosorbide MN 30 mg daily, losartan 50 mg daily, furosemide 40 mg daily, . Previous antihypertensives tried: n/a . Last practice recorded BP readings:  BP Readings from Last 3 Encounters:  07/08/19 122/84  06/14/19 (!) 102/56  06/02/19 117/68   . Current home BP readings: 98/60  Kidney Function Lab Results  Component Value Date   CREATININE 1.14 07/03/2019   CREATININE 1.00 06/26/2019   CREATININE 1.18 06/19/2019      Component Value Date/Time   GFR 62.72 03/13/2019 1028   GFRNONAA >60 07/03/2019 0517    Pharmacist Clinical Goal(s):  Marland Kitchen Over the next 90 days, patient will work with PharmD and providers to optimize antihypertensive regimen  Interventions: . Inter-disciplinary care team collaboration (see longitudinal plan of care) . Comprehensive medication review performed; medication list updated in the electronic medical record.  . Losartan was removed from med list during admission on 3/17, so patient is not taking it and BP is low-normal without it. Continue to hold until follow up with cardiologist  Patient Self Care Activities:  . Patient will continue to check BP daily , document, and provide at future appointments . Patient will focus on medication adherence by pill box  Initial goal documentation     . Cholesterol: goal LDL< 70       CARE PLAN ENTRY (see longitudinal plan of care for additional care plan information)  Current Barriers:  . Uncontrolled hyperlipidemia, complicated by CHF, CAD, Afib . Current antihyperlipidemic regimen:  atorvastatin 80 mg daily, ezetimibe 10 mg . Previous antihyperlipidemic medications tried n/a . Most recent lipid panel:     Component Value Date/Time    CHOL 121 06/07/2019 0540   CHOL 182 11/16/2018 0900   TRIG 112 06/07/2019 0540   HDL 39 (L) 06/07/2019 0540   HDL 56 11/16/2018 0900   CHOLHDL 3.1 06/07/2019 0540   VLDL 22 06/07/2019 0540   LDLCALC 60 06/07/2019 0540   LDLCALC 102 (H) 11/16/2018 0900   LDLDIRECT 104 (H) 03/28/2019 1018   . ASCVD risk enhancing conditions: age >26, HTN, CHF, CAD  Pharmacist Clinical Goal(s):  Marland Kitchen Over the next 90 days, patient will work with PharmD and providers towards optimized antihyperlipidemic therapy  Interventions: . Comprehensive medication review performed; medication list updated in electronic medical record.  . Discussed cholesterol goals, LDL is now at goal after starting ezetimibe earlier this year  Patient Self Care Activities:  . Patient will focus on medication adherence by pill box  Initial goal documentation     . Heart Failure       CARE PLAN ENTRY  Current Barriers:  . Chronic Disease Management support, education, and care coordination needs related  to CHF  Pharmacist Clinical Goal(s):  Marland Kitchen Over the next 90 days, patient will work with PharmD and providers towards optimized HF therapy  Interventions: . Comprehensive medication review performed. . Discussed importance of medications to maintain HF status  Patient Self Care Activities:  . Self administers medications as prescribed, Calls pharmacy for medication refills, and Calls provider office for new concerns or questions  Initial goal documentation      Kidney Function Lab Results  Component Value Date   CREATININE 1.14 07/03/2019   CREATININE 1.00 06/26/2019   CREATININE 1.18 06/19/2019      Component Value Date/Time   GFR 62.72 03/13/2019 1028   GFRNONAA >60 07/03/2019 0517   GFRAA >60 07/03/2019 0517     AFIB   Patient is currently rate controlled.  Patient has failed these meds in past: n/a Patient is currently controlled on the following medications: carvedilol 12.5 mg BID, Xarelto 20 mg  daily  We discussed: denies bleeding issues  Plan  Continue current medications   Heart Failure/Hypertension   Type: Systolic  Last ejection fraction: <20% NYHA Class: I (no actitivty limitation) AHA HF Stage: B (Heart disease present - no symptoms present)  Office blood pressures are  BP Readings from Last 3 Encounters:  07/08/19 122/84  06/14/19 (!) 102/56  06/02/19 117/68   Patient checks BP at home infrequently  Patient home BP readings are ranging: 98/60 per home health. HH TID x 4 wks,   Patient has failed these meds in past: n/a Patient is currently controlled on the following medications: carvedilol 12.5 mg BID, isosorbide MN 30 mg daily, furosemide 40 mg daily, potassium chloride 10 meq BID    We discussed BP is borderline low, losartan was discontinued at discharge per patient's caregiver. Denies swelling, SOB, chest pain.  Plan  Continue current medications and control with diet and exercise    Hyperlipidemia/CAD   Lipid Panel     Component Value Date/Time   CHOL 121 06/07/2019 0540   TRIG 112 06/07/2019 0540   HDL 39 (L) 06/07/2019 0540   CHOLHDL 3.1 06/07/2019 0540   VLDL 22 06/07/2019 0540   LDLCALC 60 06/07/2019 0540    The ASCVD Risk score (Goff DC Jr., et al., 2013) failed to calculate for the following reasons:   The patient has a prior MI or stroke diagnosis   ASCVD: hx of embolic stroke, CABG, PCI 2016  Patient has failed these meds in past: n/a Patient is currently controlled on the following medications: atorvastatin 80 mg daily, ezetimibe 10 mg daily, clopidogrel 75 mg daily, isosorbide MN 30 mg daily, nitroglycerin 0.4 mg SL prn  We discussed:  Cholesterol goals, pt is now at goal LDL < 70 after starting ezetimibe earlier this year.  Plan  Continue current medications   GERD   Patient has failed these meds in past: n/a Patient is currently controlled on the following medications: pantoprazole 40 mg daily  We discussed:   Reports PPI is helping with heartburn, still has some issues occasionallly  Plan  Continue current medications    Health Maintenance   Patient is currently controlled on the following medications: meclizine 12.5 mg BID prn, Miralax prn, Vitamin B12, Tylenol prn  We discussed:  No problems with constipation currently, not taking Miralax every day. Taking Tylenol 5-6 tabs per day. Meclizine once a week.  Plan  Continue current medications   Medication Management   Pt uses [facility] mail order pharmacy for all medications Uses pill  box Pt endorses 100% compliance  We discussed: Pt does not have to go without medicines, mail order pharmacy always delivers on time. Denies issues with medication management. Reports Xarelto is $47/month, currently reasonable. Discussed options if donut hole is reached this year, including Engineer, maintenance ($85/month) or PAP.  Plan  Continue current medication management strategy     Follow up: 3 month phone visit  Charlene Brooke, PharmD Clinical Pharmacist [facility] [phone]

## 2019-07-17 ENCOUNTER — Telehealth: Payer: Self-pay | Admitting: Internal Medicine

## 2019-07-17 ENCOUNTER — Ambulatory Visit: Payer: Medicare Other | Admitting: Pharmacist

## 2019-07-17 ENCOUNTER — Other Ambulatory Visit: Payer: Self-pay

## 2019-07-17 DIAGNOSIS — I4819 Other persistent atrial fibrillation: Secondary | ICD-10-CM

## 2019-07-17 DIAGNOSIS — E785 Hyperlipidemia, unspecified: Secondary | ICD-10-CM

## 2019-07-17 DIAGNOSIS — I10 Essential (primary) hypertension: Secondary | ICD-10-CM

## 2019-07-17 DIAGNOSIS — I5022 Chronic systolic (congestive) heart failure: Secondary | ICD-10-CM

## 2019-07-17 NOTE — Telephone Encounter (Signed)
Please see below message.

## 2019-07-17 NOTE — Progress Notes (Signed)
This examination was started by staff, but could not be completed due to patient falling ill

## 2019-07-17 NOTE — Telephone Encounter (Signed)
    Mark Clayton from [facility]  Phone [phone]  Home PT 2x week fir 1 week 3x week for 2 weeks 2x week for 5 weeks 1x week for 1 week  Mark Clayton will also like to discuss possible interactions between Xarelto and Plavix

## 2019-07-17 NOTE — Patient Instructions (Signed)
Visit Information  Thank you for meeting with me to discuss your medications! I look forward to working with you to achieve your health care goals. Below is a summary of what we talked about during the visit:  Goals Addressed            This Visit's Progress   . Atrial Fibrillation: minimize drug costs       CARE PLAN ENTRY  Current Barriers:  . Chronic Disease Management support, education, and care coordination needs related to Atrial Fibrillation  Pharmacist Clinical Goal(s):  Marland Kitchen Over the next 90 days, patient will work with PharmD and providers towards optimized Afib therapy  Interventions: . Comprehensive medication review performed. . Discussed coverage gap/"donut hole" and cost issues related to Xarelto. [facility] program: Xarelto for $85/month during coverage gap  Patient Self Care Activities:  . Self administers medications as prescribed, Calls pharmacy for medication refills, and Calls provider office for new concerns or questions  Initial goal documentation    . Blood pressure goal < 130/80       CARE PLAN ENTRY (see longitudinal plan of care for additional care plan information)  Current Barriers:  . Controlled Hypertension, complicated by CHF, Afib, CAD . Current antihypertensive regimen: carvedilol 12.5 mg BID, isosorbide [place] 30 mg daily, losartan 50 mg daily, furosemide 40 mg daily, . Previous antihypertensives tried: n/a . Last practice recorded BP readings:  BP Readings from Last 3 Encounters:  07/08/19 122/84  06/14/19 (!) 102/56  06/02/19 117/68   . Current home BP readings: 98/60  Kidney Function Lab Results  Component Value Date   CREATININE 1.14 07/03/2019   CREATININE 1.00 06/26/2019   CREATININE 1.18 06/19/2019      Component Value Date/Time   GFR 62.72 03/13/2019 1028   GFRNONAA >60 07/03/2019 0517    Pharmacist Clinical Goal(s):  Marland Kitchen Over the next 90 days, patient will work with PharmD and providers to optimize antihypertensive  regimen  Interventions: . Inter-disciplinary care team collaboration (see longitudinal plan of care) . Comprehensive medication review performed; medication list updated in the electronic medical record.  . Losartan was removed from med list during admission on 3/17, so patient is not taking it and BP is low-normal without it. Continue to hold until follow up with cardiologist  Patient Self Care Activities:  . Patient will continue to check BP daily , document, and provide at future appointments . Patient will focus on medication adherence by pill box  Initial goal documentation     . Cholesterol: goal LDL< 70       CARE PLAN ENTRY (see longitudinal plan of care for additional care plan information)  Current Barriers:  . Uncontrolled hyperlipidemia, complicated by CHF, CAD, Afib . Current antihyperlipidemic regimen:  atorvastatin 80 mg daily, ezetimibe 10 mg . Previous antihyperlipidemic medications tried n/a . Most recent lipid panel:     Component Value Date/Time   CHOL 121 06/07/2019 0540   CHOL 182 11/16/2018 0900   TRIG 112 06/07/2019 0540   HDL 39 (L) 06/07/2019 0540   HDL 56 11/16/2018 0900   CHOLHDL 3.1 06/07/2019 0540   VLDL 22 06/07/2019 0540   LDLCALC 60 06/07/2019 0540   LDLCALC 102 (H) 11/16/2018 0900   LDLDIRECT 104 (H) 03/28/2019 1018   . ASCVD risk enhancing conditions: age >31, HTN, CHF, CAD  Pharmacist Clinical Goal(s):  Marland Kitchen Over the next 90 days, patient will work with PharmD and providers towards optimized antihyperlipidemic therapy  Interventions: . Comprehensive medication  review performed; medication list updated in electronic medical record.  . Discussed cholesterol goals, LDL is now at goal after starting ezetimibe earlier this year  Patient Self Care Activities:  . Patient will focus on medication adherence by pill box  Initial goal documentation     . Heart Failure       CARE PLAN ENTRY  Current Barriers:  . Chronic Disease Management  support, education, and care coordination needs related to CHF  Pharmacist Clinical Goal(s):  Marland Kitchen Over the next 90 days, patient will work with PharmD and providers towards optimized HF therapy  Interventions: . Comprehensive medication review performed. . Discussed importance of medications to maintain HF status  Patient Self Care Activities:  . Self administers medications as prescribed, Calls pharmacy for medication refills, and Calls provider office for new concerns or questions  Initial goal documentation      Mr. Fradette was given information about Chronic Care Management services today including:  1. CCM service includes personalized support from designated clinical staff supervised by his physician, including individualized plan of care and coordination with other care providers 2. 24/7 contact phone numbers for assistance for urgent and routine care needs. 3. Standard insurance, coinsurance, copays and deductibles apply for chronic care management only during months in which we provide at least 20 minutes of these services. Most insurances cover these services at 100%, however patients may be responsible for any copay, coinsurance and/or deductible if applicable. This service may help you avoid the need for more expensive face-to-face services. 4. Only one practitioner may furnish and bill the service in a calendar month. 5. The patient may stop CCM services at any time (effective at the end of the month) by phone call to the office staff.  Patient agreed to services and verbal consent obtained.   The patient verbalized understanding of instructions provided today and agreed to receive a mailed copy of patient instruction and/or educational materials. Telephone follow up appointment with pharmacy team member scheduled for: 3 months  Charlene Brooke, PharmD Clinical Pharmacist [facility] [phone]   Heart Failure Eating Plan Heart failure, also  called congestive heart failure, occurs when your heart does not pump blood well enough to meet your body's needs for oxygen-rich blood. Heart failure is a long-term (chronic) condition. Living with heart failure can be challenging. However, following your health care provider's instructions about a healthy lifestyle and working with a diet and nutrition specialist (dietitian) to choose the right foods may help to improve your symptoms. What are tips for following this plan? Reading food labels  Check food labels for the amount of sodium per serving. Choose foods that have less than 140 mg (milligrams) of sodium in each serving.  Check food labels for the number of calories per serving. This is important if you need to limit your daily calorie intake to lose weight.  Check food labels for the serving size. If you eat more than one serving, you will be eating more sodium and calories than what is listed on the label.  Look for foods that are labeled as "sodium-free," "very low sodium," or "low sodium." ? Foods labeled as "reduced sodium" or "lightly salted" may still have more sodium than what is recommended for you. Cooking  Avoid adding salt when cooking. Ask your health care provider or dietitian before using salt substitutes.  Season food with salt-free seasonings, spices, or herbs. Check the label of seasoning mixes to make sure they do not contain  salt.  Cook with heart-healthy oils, such as olive, canola, soybean, or sunflower oil.  Do not fry foods. Cook foods using low-fat methods, such as baking, boiling, grilling, and broiling.  Limit unhealthy fats when cooking by: ? Removing the skin from poultry, such as chicken. ? Removing all visible fats from meats. ? Skimming the fat off from stews, soups, and gravies before serving them. Meal planning   Limit your intake of: ? Processed, canned, or pre-packaged foods. ? Foods that are high in trans fat, such as fried foods. ? Sweets,  desserts, sugary drinks, and other foods with added sugar. ? Full-fat dairy products, such as whole milk.  Eat a balanced diet that includes: ? 4-5 servings of fruit each day and 4-5 servings of vegetables each day. At each meal, try to fill half of your plate with fruits and vegetables. ? Up to 6-8 servings of whole grains each day. ? Up to 2 servings of lean meat, poultry, or fish each day. One serving of meat is equal to 3 oz. This is about the same size as a deck of cards. ? 2 servings of low-fat dairy each day. ? Heart-healthy fats. Healthy fats called omega-3 fatty acids are found in foods such as flaxseed and cold-water fish like sardines, salmon, and mackerel.  Aim to eat 25-35 g (grams) of fiber a day. Foods that are high in fiber include apples, broccoli, carrots, beans, peas, and whole grains.  Do not add salt or condiments that contain salt (such as soy sauce) to foods before eating.  When eating at a restaurant, ask that your food be prepared with less salt or no salt, if possible.  Try to eat 2 or more vegetarian meals each week.  Eat more home-cooked food and eat less restaurant, buffet, and fast food. General information  Do not eat more than 2,300 mg of salt (sodium) a day. The amount of sodium that is recommended for you may be lower, depending on your condition.  Maintain a healthy body weight as directed. Ask your health care provider what a healthy weight is for you. ? Check your weight every day. ? Work with your health care provider and dietitian to make a plan that is right for you to lose weight or maintain your current weight.  Limit how much fluid you drink. Ask your health care provider or dietitian how much fluid you can have each day.  Limit or avoid alcohol as told by your health care provider or dietitian. Recommended foods The items listed may not be a complete list. Talk with your dietitian about what dietary choices are best for you. Fruits All  fresh, frozen, and canned fruits. Dried fruits, such as raisins, prunes, and cranberries. Vegetables All fresh vegetables. Vegetables that are frozen without sauce or added salt. Low-sodium or sodium-free canned vegetables. Grains Bread with less than 80 mg of sodium per slice. Whole-wheat pasta, quinoa, and Start rice. Oats and oatmeal. Barley. [place]. Grits and cream of wheat. Whole-grain and whole-wheat cold cereal. Meats and other protein foods Lean cuts of meat. Skinless chicken and [place]. Fish with high omega-3 fatty acids, such as salmon, sardines, and other cold-water fishes. Eggs. Dried beans, peas, and edamame. Unsalted nuts and nut butters. Dairy Low-fat or nonfat (skim) milk and dried milk. Rice milk, soy milk, and almond milk. Low-fat or nonfat yogurt. Small amounts of reduced-sodium block cheese. Low-sodium cottage cheese. Fats and oils Olive, canola, soybean, flaxseed, or sunflower oil. Avocado. Sweets and desserts  Apple sauce. Granola bars. Sugar-free pudding and gelatin. Frozen fruit bars. Seasoning and other foods Fresh and dried herbs. Lemon or lime juice. Vinegar. Low-sodium ketchup. Salt-free marinades, salad dressings, sauces, and seasonings. The items listed above may not be a complete list of foods and beverages you can eat. Contact a dietitian for more information. Foods to avoid The items listed may not be a complete list. Talk with your dietitian about what dietary choices are best for you. Fruits Fruits that are dried with sodium-containing preservatives. Vegetables Canned vegetables. Frozen vegetables with sauce or seasonings. Creamed vegetables. [place] fries. Onion rings. Pickled vegetables and sauerkraut. Grains Bread with more than 80 mg of sodium per slice. Hot or cold cereal with more than 140 mg sodium per serving. Salted pretzels and crackers. Pre-packaged breadcrumbs. Bagels, croissants, and biscuits. Meats and other protein foods Ribs and chicken wings.  Bacon, ham, pepperoni, bologna, salami, and packaged luncheon meats. Hot dogs, bratwurst, and sausage. Canned meat. Smoked meat and fish. Salted nuts and seeds. Dairy Whole milk, half-and-half, and cream. Buttermilk. Processed cheese, cheese spreads, and cheese curds. Regular cottage cheese. Feta cheese. Shredded cheese. String cheese. Fats and oils Butter, lard, shortening, ghee, and bacon fat. Canned and packaged gravies. Seasoning and other foods Onion salt, garlic salt, table salt, and sea salt. Marinades. Regular salad dressings. Relishes, pickles, and olives. Meat flavorings and tenderizers, and bouillon cubes. Horseradish, ketchup, and mustard. Worcestershire sauce. Teriyaki sauce, soy sauce (including reduced sodium). Hot sauce and Tabasco sauce. Steak sauce, fish sauce, oyster sauce, and cocktail sauce. Taco seasonings. Barbecue sauce. Tartar sauce. The items listed above may not be a complete list of foods and beverages you should avoid. Contact a dietitian for more information. Summary  A heart failure eating plan includes changes that limit your intake of sodium and unhealthy fat, and it may help you lose weight or maintain a healthy weight. Your health care provider may also recommend limiting how much fluid you drink.  Most people with heart failure should eat no more than 2,300 mg of salt (sodium) a day. The amount of sodium that is recommended for you may be lower, depending on your condition.  Contact your health care provider or dietitian before making any major changes to your diet. This information is not intended to replace advice given to you by your health care provider. Make sure you discuss any questions you have with your health care provider. Document Revised: 05/12/2018 Document Reviewed: 07/31/2016 Elsevier Patient Education  [place].

## 2019-07-17 NOTE — Telephone Encounter (Signed)
Fine

## 2019-07-18 ENCOUNTER — Telehealth: Payer: Self-pay | Admitting: Internal Medicine

## 2019-07-18 NOTE — Telephone Encounter (Signed)
Also received call from daughter [name] to discuss patient's anxiety. She says pt has been more anxious the past 2 days, irritable with other people, not sleeping through the night and waking her up as well. She describes an event early this morning ~5:30am as a "panic attack" where pt was very anxious and thought he was having a heart attack, took one nitroglycerin and issues resolved, pt went back to sleep.  [name] is requesting something to help him sleep. He has not tried anything previously.   May consider trazodone 50 mg or mirtazapine 7.5 mg to help both anxiety and insomnia.

## 2019-07-18 NOTE — Telephone Encounter (Signed)
Called LVM for call back to office

## 2019-07-18 NOTE — Telephone Encounter (Signed)
Medications just reviewed at recent visit and he is supposed to be on xarelto and plavix currently.

## 2019-07-18 NOTE — Telephone Encounter (Signed)
Would recommend melatonin for sleep first to see if this helps. Given recent stroke would like to avoid new medications if possible.

## 2019-07-18 NOTE — Telephone Encounter (Signed)
Called and discussed message with Mark Clayton

## 2019-07-18 NOTE — Telephone Encounter (Signed)
    Daughter [name] calling to discuss issue patient is having with anxiety. Requesting call back

## 2019-07-18 NOTE — Telephone Encounter (Signed)
Mark Clayton also wants to know the possible interaction between Xarelto and Plavix the patient is currently taking both medications

## 2019-07-19 DIAGNOSIS — Z7901 Long term (current) use of anticoagulants: Secondary | ICD-10-CM | POA: Diagnosis not present

## 2019-07-19 DIAGNOSIS — I25119 Atherosclerotic heart disease of native coronary artery with unspecified angina pectoris: Secondary | ICD-10-CM | POA: Diagnosis not present

## 2019-07-19 DIAGNOSIS — I69320 Aphasia following cerebral infarction: Secondary | ICD-10-CM | POA: Diagnosis not present

## 2019-07-19 DIAGNOSIS — R1312 Dysphagia, oropharyngeal phase: Secondary | ICD-10-CM | POA: Diagnosis not present

## 2019-07-19 DIAGNOSIS — Z87440 Personal history of urinary (tract) infections: Secondary | ICD-10-CM | POA: Diagnosis not present

## 2019-07-19 DIAGNOSIS — I480 Paroxysmal atrial fibrillation: Secondary | ICD-10-CM | POA: Diagnosis not present

## 2019-07-19 DIAGNOSIS — H548 Legal blindness, as defined in USA: Secondary | ICD-10-CM | POA: Diagnosis not present

## 2019-07-19 DIAGNOSIS — E876 Hypokalemia: Secondary | ICD-10-CM | POA: Diagnosis not present

## 2019-07-19 DIAGNOSIS — I110 Hypertensive heart disease with heart failure: Secondary | ICD-10-CM | POA: Diagnosis not present

## 2019-07-19 DIAGNOSIS — Z85038 Personal history of other malignant neoplasm of large intestine: Secondary | ICD-10-CM | POA: Diagnosis not present

## 2019-07-19 DIAGNOSIS — I255 Ischemic cardiomyopathy: Secondary | ICD-10-CM | POA: Diagnosis not present

## 2019-07-19 DIAGNOSIS — Z7902 Long term (current) use of antithrombotics/antiplatelets: Secondary | ICD-10-CM | POA: Diagnosis not present

## 2019-07-19 DIAGNOSIS — Z9181 History of falling: Secondary | ICD-10-CM | POA: Diagnosis not present

## 2019-07-19 DIAGNOSIS — I69391 Dysphagia following cerebral infarction: Secondary | ICD-10-CM | POA: Diagnosis not present

## 2019-07-19 DIAGNOSIS — I69351 Hemiplegia and hemiparesis following cerebral infarction affecting right dominant side: Secondary | ICD-10-CM | POA: Diagnosis not present

## 2019-07-19 DIAGNOSIS — H3530 Unspecified macular degeneration: Secondary | ICD-10-CM | POA: Diagnosis not present

## 2019-07-19 DIAGNOSIS — I5043 Acute on chronic combined systolic (congestive) and diastolic (congestive) heart failure: Secondary | ICD-10-CM | POA: Diagnosis not present

## 2019-07-19 DIAGNOSIS — I69322 Dysarthria following cerebral infarction: Secondary | ICD-10-CM | POA: Diagnosis not present

## 2019-07-19 NOTE — Telephone Encounter (Signed)
Contacted patient's daughter [name] to relay recommendation to start melatonin 10 mg at bedtime to help with sleep. Daughter intends to start tonight. She does report patient had a better night last night.

## 2019-07-20 ENCOUNTER — Encounter: Payer: Medicare Other | Attending: Registered Nurse | Admitting: Registered Nurse

## 2019-07-20 ENCOUNTER — Other Ambulatory Visit: Payer: Self-pay

## 2019-07-20 ENCOUNTER — Encounter: Payer: Self-pay | Admitting: Registered Nurse

## 2019-07-20 VITALS — BP 133/78 | HR 62 | Temp 97.5°F | Ht 72.0 in | Wt 148.0 lb

## 2019-07-20 DIAGNOSIS — I6310 Cerebral infarction due to embolism of unspecified precerebral artery: Secondary | ICD-10-CM | POA: Diagnosis not present

## 2019-07-20 DIAGNOSIS — I10 Essential (primary) hypertension: Secondary | ICD-10-CM

## 2019-07-20 DIAGNOSIS — N390 Urinary tract infection, site not specified: Secondary | ICD-10-CM | POA: Insufficient documentation

## 2019-07-20 DIAGNOSIS — I5022 Chronic systolic (congestive) heart failure: Secondary | ICD-10-CM | POA: Diagnosis not present

## 2019-07-20 DIAGNOSIS — I4819 Other persistent atrial fibrillation: Secondary | ICD-10-CM | POA: Insufficient documentation

## 2019-07-20 NOTE — Progress Notes (Signed)
Subjective:    Patient ID: Mark Clayton, male    DOB: 1941/02/25, 79 y.o.   MRN: [id number]  HPI: Mark Clayton is a 79 y.o. male who is here for transitional care visit in follow up of his embolic stroke, PAF, essential hypertension and chronic systolic heart failure. He presented to [facility] on 06/06/2019 after falling backwards in his yard doing yard-work and striking his head. Neurology was consulted:  CT revealed SDH therefore his Xarelto was reversed. Neurosurgery was consulted: See Dr Annette Stable note: "No indication for any type of surgical intervention at present".   CT Head WO Contrast:  IMPRESSION: 1. Extra-axial hemorrhage in the left interhemispheric fissure as above. No significant mass effect. 2. No acute infarct identified. CT Angio Head W/WO Contrast:  IMPRESSION: 1. Left convexity hemorrhage unchanged from earlier today. This appears to be a combination of left parafalcine subdural hematoma and subarachnoid hemorrhage. This is related to trauma given history of fall. No acute ischemic infarct on CT. 2. 50% diameter stenosis proximal right internal carotid artery. Left carotid artery patent without significant stenosis. Both vertebral arteries patent in the neck. 3. Segmental occlusion left M3 branch with trickle distal flow and additional clot distally. 4. CT perfusion demonstrates 9 mL of core infarct and 81 mL of additional ischemia in the left parietal lobe corresponding to the left M3 branch occlusion. 5. These results were called by telephone at the time of interpretation on 06/06/2019 at 6:44 pm to provider Mark Clayton , who verbally acknowledged these results. Neurology felt his stroke was embolic due to Atrial Fibrillation.   Mark Clayton was admitted to inpatient Rehabilitation on 06/14/2019 and discharged home on 07/08/2019. He is now receiving home health from [facility] he reports. He states she has nerve pain in his right hand and right leg with  tingling. He rated his pain 0. Also reports he has a good appetite.   Grand-daughter in room.     Pain Inventory Average Pain 0 Pain Right Now 0 My pain is no pain  In the last 24 hours, has pain interfered with the following? General activity 0 Relation with others 0 Enjoyment of life 0 What TIME of day is your pain at its worst? no pain Sleep (in general) Good  Pain is worse with: no pain Pain improves with: no [ain Relief from Meds: no pain  Mobility use a wheelchair needs help with transfers Do you have any goals in this area?  yes  Function retired I need assistance with the following:  dressing, bathing, toileting, meal prep, household duties and shopping  Neuro/Psych tremor tingling trouble walking spasms  Prior Studies transitional care  Physicians involved in your care transitional care   Family History  Problem Relation Age of Onset  . Hypertension Father   . Hyperlipidemia Father   . Heart disease Father   . Prostate cancer Father   . Alzheimer's disease Mother   . Hypertension Sister   . Hyperlipidemia Sister   . Colon cancer Neg Hx   . Esophageal cancer Neg Hx   . Rectal cancer Neg Hx   . Stomach cancer Neg Hx    Social History   Socioeconomic History  . Marital status: Widowed    Spouse name: Not on file  . Number of children: 3  . Years of education: Not on file  . Highest education level: Not on file  Occupational History  . Occupation: self Physiological scientist  Tobacco Use  .  Smoking status: Never Smoker  . Smokeless tobacco: Never Used  Substance and Sexual Activity  . Alcohol use: Yes    Alcohol/week: 2.0 standard drinks    Types: 2 Cans of beer per week  . Drug use: No  . Sexual activity: Not Currently  Other Topics Concern  . Not on file  Social History Narrative   Full time. Married.    Social Determinants of Health   Financial Resource Strain: Low Risk   . Difficulty of Paying Living Expenses: Not very hard    Food Insecurity: No Food Insecurity  . Worried About [name] fundraiser in the Last Year: Never true  . Ran Out of Food in the Last Year: Never true  Transportation Needs: No Transportation Needs  . Lack of Transportation (Medical): No  . Lack of Transportation (Non-Medical): No  Physical Activity: Sufficiently Active  . Days of Exercise per Week: 6 days  . Minutes of Exercise per Session: 40 min  Stress:   . Feeling of Stress :   Social Connections: Slightly Isolated  . Frequency of Communication with Friends and Family: More than three times a week  . Frequency of Social Gatherings with Friends and Family: More than three times a week  . Attends Religious Services: More than 4 times per year  . Active Member of Clubs or Organizations: Yes  . Attends Archivist Meetings: More than 4 times per year  . Marital Status: Widowed   Past Surgical History:  Procedure Laterality Date  . CARDIAC CATHETERIZATION N/A 09/20/2014   Procedure: Left Heart Cath and Coronary Angiography;  Surgeon: Jettie Booze, MD; LAD 95%, D1 100%, CFX liner percent, OM 200%, OM 390%, RCA 90%, LIMA-LAD okay, SVG-OM 2-OM 3 minimal disease, SVG-RPDA-RPLB 100% between the RPDA and RPL     . CARDIAC CATHETERIZATION N/A 09/20/2014   Procedure: Coronary Stent Intervention;  Surgeon: Jettie Booze, MD; Synergy DES 4 x 24 mm reducing the stenosis to 5%   . CARDIAC DEFIBRILLATOR PLACEMENT  01/17/03   6949 lead. Medtronic. remote-no; with later revision  . CARDIOVERSION N/A 05/22/2016   Procedure: CARDIOVERSION;  Surgeon: Lelon Perla, MD;  Location: [facility] ENDOSCOPY;  Service: Cardiovascular;  Laterality: N/A;  . CARDIOVERSION N/A 08/10/2017   Procedure: CARDIOVERSION;  Surgeon: Pixie Casino, MD;  Location: [facility] ENDOSCOPY;  Service: Cardiovascular;  Laterality: N/A;  . CATARACT EXTRACTION W/ INTRAOCULAR LENS  IMPLANT, BILATERAL Bilateral June/-July 2009   Dr. Katy Fitch  . CATARACT EXTRACTION W/PHACO  Bilateral 2010   Dr. Katy Fitch  . COLON RESECTION  02/09/2011   Procedure: LAPAROSCOPIC SIGMOID COLON RESECTION;  Surgeon: Pedro Earls, MD;  Location: [facility] ORS;  Service: General;  Laterality: N/A;  Laparoscopic Assisted Sigmoid Colectomy  . COLON SURGERY    . COLONOSCOPY  08/31/2011   Procedure: COLONOSCOPY;  Surgeon: Jerene Bears, MD;  Location: [facility] ENDOSCOPY;  Service: Gastroenterology;  Laterality: N/A;  . COLONOSCOPY N/A 09/05/2012   Procedure: COLONOSCOPY;  Surgeon: Jerene Bears, MD;  Location: [facility] ENDOSCOPY;  Service: Gastroenterology;  Laterality: N/A;  . COLONOSCOPY N/A 04/18/2013   Procedure: COLONOSCOPY;  Surgeon: Jerene Bears, MD;  Location: [facility] ENDOSCOPY;  Service: Gastroenterology;  Laterality: N/A;  . COLONOSCOPY N/A 04/09/2014   Procedure: COLONOSCOPY;  Surgeon: Jerene Bears, MD;  Location: [facility] ENDOSCOPY;  Service: Gastroenterology;  Laterality: N/A;  . COLONOSCOPY WITH PROPOFOL N/A 05/03/2017   Procedure: COLONOSCOPY WITH PROPOFOL;  Surgeon: Jerene Bears, MD;  Location: [facility] ENDOSCOPY;  Service: Gastroenterology;  Laterality: N/A;  . CORONARY ANGIOPLASTY    . CORONARY ARTERY BYPASS GRAFT  01/2002   LIMA-LAD, SVG-OM 2-OM 3, SVG-RPDA-RPLB  . CORONARY STENT INTERVENTION N/A 11/22/2018   Procedure: CORONARY STENT INTERVENTION;  Surgeon: Nelva Bush, MD;  Location: [place] [facility];  Service: Cardiovascular;  Laterality: N/A;  SVG - RCA  . ICD GENERATOR CHANGE  2010   Medtronic Virtuoso II VR ICD  . ICD GENERATOR CHANGEOUT N/A 12/07/2018   Procedure: ICD GENERATOR CHANGEOUT;  Surgeon: Deboraha Sprang, MD;  Location: [place] [facility];  Service: Cardiovascular;  Laterality: N/A;  . INGUINAL HERNIA REPAIR Right 2000's X 2  . LAPAROSCOPIC RIGHT HEMI COLECTOMY N/A 11/04/2012   Procedure: LAPAROSCOPIC RIGHT HEMI COLECTOMY;  Surgeon: Pedro Earls, MD;  Location: [facility]S;  Service: General;  Laterality: N/A;  . LEFT HEART CATH AND CORS/GRAFTS ANGIOGRAPHY Left 11/22/2018   Procedure: LEFT  HEART CATH AND CORS/GRAFTS ANGIOGRAPHY;  Surgeon: Nelva Bush, MD;  Location: [place] [facility];  Service: Cardiovascular;  Laterality: Left;  . TONSILLECTOMY  ~ 1950   Past Medical History:  Diagnosis Date  . AICD (automatic cardioverter/defibrillator) present 01/17/2003   Medtronic Maximo 7232CX ICD, serial [id number]  . Anemia 02-06-11   takes oral iron  . Arthritis    hands, knees  . CAD (coronary artery disease) 2003   a. h/o MI and CABG in 2003. b. s/p DES to SVG-RPDA-RPLB in 08/2014.  Marland Kitchen Cancer of sigmoid colon ([place]) 2012   a. s/p colon surgery.  . Carotid bruit   . Chronic systolic CHF (congestive heart failure) (HCC)    a. EF 20% in 2014; b. 08/2017 Echo: EF 20-25%, diff HK, Gr3 DD. Triv AI. Mod MR. Sev dil LA. Mildly dil RV w/ mildly reduced RV fxn. Mildly dil RA. Mod TR. PASP 67mHg.  Marland Kitchen Cough   . GERD (gastroesophageal reflux disease) 02-06-11  . HTN (hypertension)   . Hyperlipidemia   . Ischemic cardiomyopathy    a. EF 20% in 2014. (Master study EF >20%); b. 08/2017 Echo: EF 20-25%, diff HK. Gr3 DD.  . LV (left ventricular) mural thrombus    a. 12/2012 Echo: EF 20% with mural thrombus No evidence of thrombus on 08/2017 echo.  . Macular degeneration   . Myocardial infarct ([place])    2003  . PAF (paroxysmal atrial fibrillation) (HCC)    a. CHA2DS2VASc = 5-->Xarelto/Tikosyn.   BP 133/78   Pulse 62   Temp (!) 97.5 F (36.4 C)   Ht 6' (1.829 m)   Wt 148 lb (67.1 kg)   SpO2 95%   BMI 20.07 kg/m   Opioid Risk Score:   Fall Risk Score:  `1  Depression screen PHQ 2/9  Depression screen [facility] 2/9 02/15/2019 08/26/2018 01/17/2018 01/15/2017 01/10/2016  Decreased Interest 0 0 0 0 0  Down, Depressed, Hopeless 0 0 0 0 0  PHQ - 2 Score 0 0 0 0 0  Some recent data might be hidden   Review of Systems  Constitutional: Negative.   HENT: Negative.   Eyes: Negative.   Cardiovascular: Negative.   Gastrointestinal: Negative.   Endocrine: Negative.   Genitourinary:  Negative.   Musculoskeletal: Positive for gait problem.  Skin: Negative.   Allergic/Immunologic: Negative.   Neurological: Positive for tremors.       Tingling  Hematological: Negative.   Psychiatric/Behavioral: Negative.   All other systems reviewed and are negative.      Objective:   Physical  Exam Vitals and nursing note reviewed.  Constitutional:      Appearance: Normal appearance.  Cardiovascular:     Rate and Rhythm: Normal rate and regular rhythm.     Pulses: Normal pulses.     Heart sounds: Normal heart sounds.  Pulmonary:     Effort: Pulmonary effort is normal.     Breath sounds: Normal breath sounds.  Musculoskeletal:     Cervical back: Normal range of motion and neck supple.     Comments: Normal Muscle Bulk and Muscle Testing Reveals:  Upper Extremities: Right: Decreased ROM 90 Degrees  and Muscle Strength 3/5 Left: Full ROM and Muscle Strength 5/5 Lower Extremities: Right: Hemiparesis Left: Full ROM and Muscle Strength 5/5 Arrived in wheelchair   Skin:    General: Skin is warm and dry.  Neurological:     Mental Status: He is alert and oriented to person, place, and time.  Psychiatric:        Mood and Affect: Mood normal.        Behavior: Behavior normal.           Assessment & Plan:  1.Embolic stroke: Continue Home Health Therapy: Has a scheduled HFU appointment with Neurology.  2. PAF: Continue current medication Regimen: Cardiology Following.  3.Essential hypertension: Continue current medication regimen: PCP Following. Continue to Monitor.  3. Chronic systolic heart failure: Continue current medication regimen: Cardiology Following/   15 minutes of face to face patient care time was spent during this visit. All questions were encouraged and answered..  F/U in 4- 6 weeks with Dr Letta Pate

## 2019-07-21 DIAGNOSIS — R1312 Dysphagia, oropharyngeal phase: Secondary | ICD-10-CM | POA: Diagnosis not present

## 2019-07-21 DIAGNOSIS — I255 Ischemic cardiomyopathy: Secondary | ICD-10-CM | POA: Diagnosis not present

## 2019-07-21 DIAGNOSIS — E876 Hypokalemia: Secondary | ICD-10-CM | POA: Diagnosis not present

## 2019-07-21 DIAGNOSIS — I69320 Aphasia following cerebral infarction: Secondary | ICD-10-CM | POA: Diagnosis not present

## 2019-07-21 DIAGNOSIS — I110 Hypertensive heart disease with heart failure: Secondary | ICD-10-CM | POA: Diagnosis not present

## 2019-07-21 DIAGNOSIS — I5043 Acute on chronic combined systolic (congestive) and diastolic (congestive) heart failure: Secondary | ICD-10-CM | POA: Diagnosis not present

## 2019-07-21 DIAGNOSIS — H548 Legal blindness, as defined in USA: Secondary | ICD-10-CM | POA: Diagnosis not present

## 2019-07-21 DIAGNOSIS — Z7902 Long term (current) use of antithrombotics/antiplatelets: Secondary | ICD-10-CM | POA: Diagnosis not present

## 2019-07-21 DIAGNOSIS — Z9181 History of falling: Secondary | ICD-10-CM | POA: Diagnosis not present

## 2019-07-21 DIAGNOSIS — I25119 Atherosclerotic heart disease of native coronary artery with unspecified angina pectoris: Secondary | ICD-10-CM | POA: Diagnosis not present

## 2019-07-21 DIAGNOSIS — H3530 Unspecified macular degeneration: Secondary | ICD-10-CM | POA: Diagnosis not present

## 2019-07-21 DIAGNOSIS — I69391 Dysphagia following cerebral infarction: Secondary | ICD-10-CM | POA: Diagnosis not present

## 2019-07-21 DIAGNOSIS — I69322 Dysarthria following cerebral infarction: Secondary | ICD-10-CM | POA: Diagnosis not present

## 2019-07-21 DIAGNOSIS — I69351 Hemiplegia and hemiparesis following cerebral infarction affecting right dominant side: Secondary | ICD-10-CM | POA: Diagnosis not present

## 2019-07-21 DIAGNOSIS — Z7901 Long term (current) use of anticoagulants: Secondary | ICD-10-CM | POA: Diagnosis not present

## 2019-07-21 DIAGNOSIS — I480 Paroxysmal atrial fibrillation: Secondary | ICD-10-CM | POA: Diagnosis not present

## 2019-07-21 DIAGNOSIS — Z85038 Personal history of other malignant neoplasm of large intestine: Secondary | ICD-10-CM | POA: Diagnosis not present

## 2019-07-21 DIAGNOSIS — Z87440 Personal history of urinary (tract) infections: Secondary | ICD-10-CM | POA: Diagnosis not present

## 2019-07-24 ENCOUNTER — Other Ambulatory Visit: Payer: Self-pay

## 2019-07-24 ENCOUNTER — Encounter (INDEPENDENT_AMBULATORY_CARE_PROVIDER_SITE_OTHER): Payer: Self-pay | Admitting: Ophthalmology

## 2019-07-24 ENCOUNTER — Ambulatory Visit (INDEPENDENT_AMBULATORY_CARE_PROVIDER_SITE_OTHER): Payer: Medicare Other | Admitting: Ophthalmology

## 2019-07-24 DIAGNOSIS — I110 Hypertensive heart disease with heart failure: Secondary | ICD-10-CM | POA: Diagnosis not present

## 2019-07-24 DIAGNOSIS — I69322 Dysarthria following cerebral infarction: Secondary | ICD-10-CM | POA: Diagnosis not present

## 2019-07-24 DIAGNOSIS — H548 Legal blindness, as defined in USA: Secondary | ICD-10-CM | POA: Diagnosis not present

## 2019-07-24 DIAGNOSIS — I255 Ischemic cardiomyopathy: Secondary | ICD-10-CM | POA: Diagnosis not present

## 2019-07-24 DIAGNOSIS — Z87440 Personal history of urinary (tract) infections: Secondary | ICD-10-CM | POA: Diagnosis not present

## 2019-07-24 DIAGNOSIS — I69391 Dysphagia following cerebral infarction: Secondary | ICD-10-CM | POA: Diagnosis not present

## 2019-07-24 DIAGNOSIS — H3530 Unspecified macular degeneration: Secondary | ICD-10-CM | POA: Diagnosis not present

## 2019-07-24 DIAGNOSIS — Z7901 Long term (current) use of anticoagulants: Secondary | ICD-10-CM | POA: Diagnosis not present

## 2019-07-24 DIAGNOSIS — I25119 Atherosclerotic heart disease of native coronary artery with unspecified angina pectoris: Secondary | ICD-10-CM | POA: Diagnosis not present

## 2019-07-24 DIAGNOSIS — I69320 Aphasia following cerebral infarction: Secondary | ICD-10-CM | POA: Diagnosis not present

## 2019-07-24 DIAGNOSIS — I480 Paroxysmal atrial fibrillation: Secondary | ICD-10-CM | POA: Diagnosis not present

## 2019-07-24 DIAGNOSIS — H353211 Exudative age-related macular degeneration, right eye, with active choroidal neovascularization: Secondary | ICD-10-CM

## 2019-07-24 DIAGNOSIS — Z85038 Personal history of other malignant neoplasm of large intestine: Secondary | ICD-10-CM | POA: Diagnosis not present

## 2019-07-24 DIAGNOSIS — E876 Hypokalemia: Secondary | ICD-10-CM | POA: Diagnosis not present

## 2019-07-24 DIAGNOSIS — I5043 Acute on chronic combined systolic (congestive) and diastolic (congestive) heart failure: Secondary | ICD-10-CM | POA: Diagnosis not present

## 2019-07-24 DIAGNOSIS — Z9181 History of falling: Secondary | ICD-10-CM | POA: Diagnosis not present

## 2019-07-24 DIAGNOSIS — Z7902 Long term (current) use of antithrombotics/antiplatelets: Secondary | ICD-10-CM | POA: Diagnosis not present

## 2019-07-24 DIAGNOSIS — I69351 Hemiplegia and hemiparesis following cerebral infarction affecting right dominant side: Secondary | ICD-10-CM | POA: Diagnosis not present

## 2019-07-24 DIAGNOSIS — R1312 Dysphagia, oropharyngeal phase: Secondary | ICD-10-CM | POA: Diagnosis not present

## 2019-07-24 MED ORDER — AFLIBERCEPT 2MG/0.05ML IZ SOLN FOR KALEIDOSCOPE
2.0000 mg | INTRAVITREAL | Status: AC | PRN
  Administered 2019-07-24: 2 mg via INTRAVITREAL

## 2019-07-24 NOTE — Progress Notes (Signed)
07/24/2019     CHIEF COMPLAINT Patient presents for Retina Follow Up   HISTORY OF PRESENT ILLNESS: Mark Clayton is a 79 y.o. male who presents to the clinic today for:   HPI    Retina Follow Up    Patient presents with  Wet AMD.  In right eye.  This started 8 weeks ago.  Severity is mild.  Duration of 8 weeks.  Since onset it is stable.          Comments    8 Week AMD F/U OD, poss Eylea OD  Pt denies noticeable changes to [place] OU since last visit. Pt denies ocular pain, flashes of light, or floaters OU. Pt sts, "I can't see nothing hardly at all."        Last edited by Rockie Neighbours, [place] on 07/24/2019  9:38 AM. (History)      Referring physician: [name], MD [place],  [place] [address]  HISTORICAL INFORMATION:   Selected notes from the MEDICAL RECORD NUMBER    Lab Results  Component Value Date   HGBA1C 6.1 (H) 06/07/2019     CURRENT MEDICATIONS: No current outpatient medications on file. (Ophthalmic Drugs)   No current facility-administered medications for this visit. (Ophthalmic Drugs)   Current Outpatient Medications (Other)  Medication Sig  . acetaminophen (TYLENOL) 325 MG tablet Take 1-2 tablets (325-650 mg total) by mouth every 4 (four) hours as needed for mild pain.  Marland Kitchen atorvastatin (LIPITOR) 80 MG tablet Take 1 tablet (80 mg total) by mouth daily.  . carvedilol (COREG) 12.5 MG tablet Take 1 tablet (12.5 mg total) by mouth 2 (two) times daily with a meal.  . clopidogrel (PLAVIX) 75 MG tablet Take 1 tablet (75 mg total) by mouth daily.  Marland Kitchen ezetimibe (ZETIA) 10 MG tablet Take 1 tablet (10 mg total) by mouth daily.  . furosemide (LASIX) 40 MG tablet TAKE 1 TABLET BY MOUTH  DAILY  . isosorbide mononitrate (IMDUR) 30 MG 24 hr tablet TAKE 1 TABLET BY MOUTH  DAILY  . losartan (COZAAR) 50 MG tablet Take 50 mg by mouth daily.  . meclizine (ANTIVERT) 12.5 MG tablet Take 1 tablet (12.5 mg total) by mouth 2 (two) times daily as needed for  dizziness.  . nitroGLYCERIN (NITROSTAT) 0.4 MG SL tablet   . pantoprazole (PROTONIX) 40 MG tablet TAKE 1 TABLET BY MOUTH  DAILY  . polyethylene glycol (MIRALAX / GLYCOLAX) 17 g packet Take 17 g by mouth daily.  . potassium chloride (KLOR-CON) 10 MEQ tablet Take 1 tablet (10 mEq total) by mouth 2 (two) times daily.  . potassium chloride (KLOR-CON) 10 MEQ tablet Take 10 mEq by mouth 2 (two) times daily.  . rivaroxaban (XARELTO) 20 MG TABS tablet Take 1 tablet (20 mg total) by mouth daily with supper.  . rivaroxaban (XARELTO) 20 MG TABS tablet Take 1 tablet (20 mg total) by mouth daily with supper.  . vitamin B-12 (CYANOCOBALAMIN) 100 MCG tablet Take 100 mcg by mouth daily.    No current facility-administered medications for this visit. (Other)      REVIEW OF SYSTEMS:    ALLERGIES Allergies  Allergen Reactions  . Latex Rash  . Tape Rash and Other (See Comments)    USE PAPER    PAST MEDICAL HISTORY Past Medical History:  Diagnosis Date  . AICD (automatic cardioverter/defibrillator) present 01/17/2003   Medtronic Maximo 7232CX ICD, serial [id number]  . Anemia 02-06-11   takes oral iron  .  Arthritis    hands, knees  . CAD (coronary artery disease) 2003   a. h/o MI and CABG in 2003. b. s/p DES to SVG-RPDA-RPLB in 08/2014.  Marland Kitchen Cancer of sigmoid colon ([place]) 2012   a. s/p colon surgery.  . Carotid bruit   . Chronic systolic CHF (congestive heart failure) (HCC)    a. EF 20% in 2014; b. 08/2017 Echo: EF 20-25%, diff HK, Gr3 DD. Triv AI. Mod MR. Sev dil LA. Mildly dil RV w/ mildly reduced RV fxn. Mildly dil RA. Mod TR. PASP 26mHg.  Marland Kitchen Cough   . GERD (gastroesophageal reflux disease) 02-06-11  . HTN (hypertension)   . Hyperlipidemia   . Ischemic cardiomyopathy    a. EF 20% in 2014. (Master study EF >20%); b. 08/2017 Echo: EF 20-25%, diff HK. Gr3 DD.  . LV (left ventricular) mural thrombus    a. 12/2012 Echo: EF 20% with mural thrombus No evidence of thrombus on 08/2017 echo.  . Macular  degeneration   . Myocardial infarct ([place])    2003  . PAF (paroxysmal atrial fibrillation) (HCC)    a. CHA2DS2VASc = 5-->Xarelto/Tikosyn.   Past Surgical History:  Procedure Laterality Date  . CARDIAC CATHETERIZATION N/A 09/20/2014   Procedure: Left Heart Cath and Coronary Angiography;  Surgeon: Jettie Booze, MD; LAD 95%, D1 100%, CFX liner percent, OM 200%, OM 390%, RCA 90%, LIMA-LAD okay, SVG-OM 2-OM 3 minimal disease, SVG-RPDA-RPLB 100% between the RPDA and RPL     . CARDIAC CATHETERIZATION N/A 09/20/2014   Procedure: Coronary Stent Intervention;  Surgeon: Jettie Booze, MD; Synergy DES 4 x 24 mm reducing the stenosis to 5%   . CARDIAC DEFIBRILLATOR PLACEMENT  01/17/03   6949 lead. Medtronic. remote-no; with later revision  . CARDIOVERSION N/A 05/22/2016   Procedure: CARDIOVERSION;  Surgeon: Lelon Perla, MD;  Location: [facility] ENDOSCOPY;  Service: Cardiovascular;  Laterality: N/A;  . CARDIOVERSION N/A 08/10/2017   Procedure: CARDIOVERSION;  Surgeon: Pixie Casino, MD;  Location: [facility] ENDOSCOPY;  Service: Cardiovascular;  Laterality: N/A;  . CATARACT EXTRACTION W/ INTRAOCULAR LENS  IMPLANT, BILATERAL Bilateral June/-July 2009   Dr. Katy Fitch  . CATARACT EXTRACTION W/PHACO Bilateral 2010   Dr. Katy Fitch  . COLON RESECTION  02/09/2011   Procedure: LAPAROSCOPIC SIGMOID COLON RESECTION;  Surgeon: Pedro Earls, MD;  Location: [facility] ORS;  Service: General;  Laterality: N/A;  Laparoscopic Assisted Sigmoid Colectomy  . COLON SURGERY    . COLONOSCOPY  08/31/2011   Procedure: COLONOSCOPY;  Surgeon: Jerene Bears, MD;  Location: [facility] ENDOSCOPY;  Service: Gastroenterology;  Laterality: N/A;  . COLONOSCOPY N/A 09/05/2012   Procedure: COLONOSCOPY;  Surgeon: Jerene Bears, MD;  Location: [facility] ENDOSCOPY;  Service: Gastroenterology;  Laterality: N/A;  . COLONOSCOPY N/A 04/18/2013   Procedure: COLONOSCOPY;  Surgeon: Jerene Bears, MD;  Location: [facility] ENDOSCOPY;  Service: Gastroenterology;  Laterality: N/A;  .  COLONOSCOPY N/A 04/09/2014   Procedure: COLONOSCOPY;  Surgeon: Jerene Bears, MD;  Location: [facility] ENDOSCOPY;  Service: Gastroenterology;  Laterality: N/A;  . COLONOSCOPY WITH PROPOFOL N/A 05/03/2017   Procedure: COLONOSCOPY WITH PROPOFOL;  Surgeon: Jerene Bears, MD;  Location: [facility] ENDOSCOPY;  Service: Gastroenterology;  Laterality: N/A;  . CORONARY ANGIOPLASTY    . CORONARY ARTERY BYPASS GRAFT  01/2002   LIMA-LAD, SVG-OM 2-OM 3, SVG-RPDA-RPLB  . CORONARY STENT INTERVENTION N/A 11/22/2018   Procedure: CORONARY STENT INTERVENTION;  Surgeon: Nelva Bush, MD;  Location: [facility] CV LAB;  Service: Cardiovascular;  Laterality: N/A;  SVG - RCA  . ICD GENERATOR CHANGE  2010   Medtronic Virtuoso II VR ICD  . ICD GENERATOR CHANGEOUT N/A 12/07/2018   Procedure: ICD GENERATOR CHANGEOUT;  Surgeon: Deboraha Sprang, MD;  Location: [facility] CV LAB;  Service: Cardiovascular;  Laterality: N/A;  . INGUINAL HERNIA REPAIR Right 2000's X 2  . LAPAROSCOPIC RIGHT HEMI COLECTOMY N/A 11/04/2012   Procedure: LAPAROSCOPIC RIGHT HEMI COLECTOMY;  Surgeon: Pedro Earls, MD;  Location: [facility] ORS;  Service: General;  Laterality: N/A;  . LEFT HEART CATH AND CORS/GRAFTS ANGIOGRAPHY Left 11/22/2018   Procedure: LEFT HEART CATH AND CORS/GRAFTS ANGIOGRAPHY;  Surgeon: Nelva Bush, MD;  Location: [facility] CV LAB;  Service: Cardiovascular;  Laterality: Left;  . TONSILLECTOMY  ~ 1950    FAMILY HISTORY Family History  Problem Relation Age of Onset  . Hypertension Father   . Hyperlipidemia Father   . Heart disease Father   . Prostate cancer Father   . Alzheimer's disease Mother   . Hypertension Sister   . Hyperlipidemia Sister   . Colon cancer Neg Hx   . Esophageal cancer Neg Hx   . Rectal cancer Neg Hx   . Stomach cancer Neg Hx     SOCIAL HISTORY Social History   Tobacco Use  . Smoking status: Never Smoker  . Smokeless tobacco: Never Used  Substance Use Topics  . Alcohol use: Yes    Alcohol/week: 2.0  standard drinks    Types: 2 Cans of beer per week  . Drug use: No         OPHTHALMIC EXAM: Base Eye Exam    Visual Acuity (Snellen - Linear)      Right Left   Dist [place] 20/200 +1 E card @ 2'   Dist ph [place] 20/100 -2 NI       Tonometry (Tonopen, 9:43 AM)      Right Left   Pressure 10 11       Pupils      Pupils Dark Light Shape React APD   Right PERRL 2 1 Round Brisk None   Left PERRL 3 2 Round Brisk None       Visual Fields (Counting fingers)      Left Right    Full Full       Extraocular Movement      Right Left    Full Full       Neuro/Psych    Oriented x3: Yes   Mood/Affect: Normal       Dilation    Right eye: 1.0% Mydriacyl, 2.5% Phenylephrine @ 9:44 AM        Slit Lamp and Fundus Exam    External Exam      Right Left   External Normal Normal       Slit Lamp Exam      Right Left   Lids/Lashes Normal Normal   Conjunctiva/Sclera White and quiet White and quiet   Cornea Clear Clear   Anterior Chamber Deep and quiet Deep and quiet   Iris Round and reactive Round and reactive   Lens Posterior chamber intraocular lens Posterior chamber intraocular lens   Anterior Vitreous Normal Normal       Fundus Exam      Right Left   Posterior Vitreous Normal    Disc Normal    C/D Ratio 0.25    Macula Hard drusen, Advanced age related macular degeneration, Retinal pigment epithelial atrophy, Macular thickening, Subretinal neovascular membrane  Vessels Normal    Periphery Normal           IMAGING AND PROCEDURES  Imaging and Procedures for 07/24/19  OCT, Retina - OU - Both Eyes       Right Eye Quality was good. Scan locations included subfoveal. Central Foveal Thickness: 383. Progression has been stable. Findings include abnormal foveal contour, subretinal scarring, cystoid macular edema.   Left Eye Quality was good. Scan locations included subfoveal. Central Foveal Thickness: 486. Progression has been stable. Findings include abnormal foveal  contour, cystoid macular edema, subretinal scarring.        Intravitreal Injection, Pharmacologic Agent - OD - Right Eye       Time Out 07/24/2019. 10:24 AM. Confirmed correct patient, procedure, site, and patient consented.   Anesthesia Topical anesthesia was used. Anesthetic medications included Akten 3.5%.   Procedure Preparation included 10% betadine to eyelids, Tobramycin 0.3%, Ofloxacin . A 30 gauge needle was used.   Injection:  2 mg aflibercept Alfonse Flavors) SOLN   NDC: [id number], Lot: [id number]   Route: Intravitreal, Site: Right Eye, Waste: 0 mg  Post-op Post injection exam found visual acuity of at least counting fingers. The patient tolerated the procedure well. There were no complications.                 ASSESSMENT/PLAN:  No problem-specific Assessment & Plan notes found for this encounter.      ICD-10-CM   1. Exudative age-related macular degeneration of right eye with active choroidal neovascularization (HCC)  H35.3211 OCT, Retina - OU - Both Eyes    Intravitreal Injection, Pharmacologic Agent - OD - Right Eye    aflibercept (EYLEA) SOLN 2 mg    1.  2.  3.  Ophthalmic Meds Ordered this visit:  Meds ordered this encounter  Medications  . aflibercept (EYLEA) SOLN 2 mg       No follow-ups on file.  There are no Patient Instructions on file for this visit.   Explained the diagnoses, plan, and follow up with the patient and they expressed understanding.  Patient expressed understanding of the importance of proper follow up care.   Clent Demark Lesleyann Fichter M.D. Diseases & Surgery of the Retina and Vitreous [facility] 07/24/19     Abbreviations: M myopia (nearsighted); A astigmatism; H hyperopia (farsighted); P presbyopia; Mrx spectacle prescription;  CTL contact lenses; OD right eye; OS left eye; OU both eyes  XT exotropia; ET esotropia; PEK punctate epithelial keratitis; PEE punctate epithelial erosions; DES dry eye syndrome;  MGD meibomian gland dysfunction; ATs artificial tears; PFAT's preservative free artificial tears; [place] nuclear sclerotic cataract; PSC posterior subcapsular cataract; ERM epi-retinal membrane; PVD posterior vitreous detachment; RD retinal detachment; DM diabetes mellitus; DR diabetic retinopathy; NPDR non-proliferative diabetic retinopathy; PDR proliferative diabetic retinopathy; CSME clinically significant macular edema; DME diabetic macular edema; dbh dot blot hemorrhages; CWS cotton wool spot; POAG primary open angle glaucoma; C/D cup-to-disc ratio; HVF humphrey visual field; GVF goldmann visual field; OCT optical coherence tomography; IOP intraocular pressure; BRVO Branch retinal vein occlusion; CRVO central retinal vein occlusion; CRAO central retinal artery occlusion; BRAO branch retinal artery occlusion; RT retinal tear; SB scleral buckle; PPV pars plana vitrectomy; VH Vitreous hemorrhage; PRP panretinal laser photocoagulation; IVK intravitreal kenalog; VMT vitreomacular traction; MH Macular hole;  NVD neovascularization of the disc; NVE neovascularization elsewhere; AREDS age related eye disease study; ARMD age related macular degeneration; POAG primary open angle glaucoma; EBMD epithelial/anterior basement membrane dystrophy; ACIOL anterior chamber intraocular lens;  IOL intraocular lens; PCIOL posterior chamber intraocular lens; Phaco/IOL phacoemulsification with intraocular lens placement; [place] photorefractive keratectomy; LASIK laser assisted in situ keratomileusis; HTN hypertension; DM diabetes mellitus; COPD chronic obstructive pulmonary disease

## 2019-07-24 NOTE — Assessment & Plan Note (Signed)
The nature of wet macular degeneration was discussed with the patient.  Forms of therapy reviewed include the use of Anti-VEGF medications injected painlessly into the eye, as well as other possible treatment modalities, including thermal laser therapy. Fellow eye involvement and risks were discussed with the patient. Upon the finding of wet age related macular degeneration, treatment will be offered. The treatment regimen is on a treat as needed basis with the intent to treat if necessary and extend interval of exams when possible. On average 1 out of 6 patients do not need lifetime therapy. However, the risk of recurrent disease is high for a lifetime.  Initially monthly, then periodic, examinations and evaluations will determine whether the next treatment is required on the day of the examination.  Stable macular findings at 8-week interval, repeat Eylea today to maintain

## 2019-07-25 DIAGNOSIS — Z9181 History of falling: Secondary | ICD-10-CM | POA: Diagnosis not present

## 2019-07-25 DIAGNOSIS — I25119 Atherosclerotic heart disease of native coronary artery with unspecified angina pectoris: Secondary | ICD-10-CM | POA: Diagnosis not present

## 2019-07-25 DIAGNOSIS — I69322 Dysarthria following cerebral infarction: Secondary | ICD-10-CM | POA: Diagnosis not present

## 2019-07-25 DIAGNOSIS — I5043 Acute on chronic combined systolic (congestive) and diastolic (congestive) heart failure: Secondary | ICD-10-CM | POA: Diagnosis not present

## 2019-07-25 DIAGNOSIS — Z7902 Long term (current) use of antithrombotics/antiplatelets: Secondary | ICD-10-CM | POA: Diagnosis not present

## 2019-07-25 DIAGNOSIS — I69391 Dysphagia following cerebral infarction: Secondary | ICD-10-CM | POA: Diagnosis not present

## 2019-07-25 DIAGNOSIS — I110 Hypertensive heart disease with heart failure: Secondary | ICD-10-CM | POA: Diagnosis not present

## 2019-07-25 DIAGNOSIS — H3530 Unspecified macular degeneration: Secondary | ICD-10-CM | POA: Diagnosis not present

## 2019-07-25 DIAGNOSIS — I255 Ischemic cardiomyopathy: Secondary | ICD-10-CM | POA: Diagnosis not present

## 2019-07-25 DIAGNOSIS — Z87440 Personal history of urinary (tract) infections: Secondary | ICD-10-CM | POA: Diagnosis not present

## 2019-07-25 DIAGNOSIS — R1312 Dysphagia, oropharyngeal phase: Secondary | ICD-10-CM | POA: Diagnosis not present

## 2019-07-25 DIAGNOSIS — I480 Paroxysmal atrial fibrillation: Secondary | ICD-10-CM | POA: Diagnosis not present

## 2019-07-25 DIAGNOSIS — E876 Hypokalemia: Secondary | ICD-10-CM | POA: Diagnosis not present

## 2019-07-25 DIAGNOSIS — I69320 Aphasia following cerebral infarction: Secondary | ICD-10-CM | POA: Diagnosis not present

## 2019-07-25 DIAGNOSIS — Z7901 Long term (current) use of anticoagulants: Secondary | ICD-10-CM | POA: Diagnosis not present

## 2019-07-25 DIAGNOSIS — H548 Legal blindness, as defined in USA: Secondary | ICD-10-CM | POA: Diagnosis not present

## 2019-07-25 DIAGNOSIS — I69351 Hemiplegia and hemiparesis following cerebral infarction affecting right dominant side: Secondary | ICD-10-CM | POA: Diagnosis not present

## 2019-07-25 DIAGNOSIS — Z85038 Personal history of other malignant neoplasm of large intestine: Secondary | ICD-10-CM | POA: Diagnosis not present

## 2019-07-26 ENCOUNTER — Ambulatory Visit (INDEPENDENT_AMBULATORY_CARE_PROVIDER_SITE_OTHER): Payer: Medicare Other

## 2019-07-26 ENCOUNTER — Ambulatory Visit (INDEPENDENT_AMBULATORY_CARE_PROVIDER_SITE_OTHER): Payer: Medicare Other | Admitting: *Deleted

## 2019-07-26 ENCOUNTER — Telehealth: Payer: Self-pay | Admitting: Internal Medicine

## 2019-07-26 DIAGNOSIS — H548 Legal blindness, as defined in USA: Secondary | ICD-10-CM | POA: Diagnosis not present

## 2019-07-26 DIAGNOSIS — Z7901 Long term (current) use of anticoagulants: Secondary | ICD-10-CM | POA: Diagnosis not present

## 2019-07-26 DIAGNOSIS — I110 Hypertensive heart disease with heart failure: Secondary | ICD-10-CM | POA: Diagnosis not present

## 2019-07-26 DIAGNOSIS — I255 Ischemic cardiomyopathy: Secondary | ICD-10-CM

## 2019-07-26 DIAGNOSIS — I5043 Acute on chronic combined systolic (congestive) and diastolic (congestive) heart failure: Secondary | ICD-10-CM | POA: Diagnosis not present

## 2019-07-26 DIAGNOSIS — I69391 Dysphagia following cerebral infarction: Secondary | ICD-10-CM | POA: Diagnosis not present

## 2019-07-26 DIAGNOSIS — Z7902 Long term (current) use of antithrombotics/antiplatelets: Secondary | ICD-10-CM | POA: Diagnosis not present

## 2019-07-26 DIAGNOSIS — I69322 Dysarthria following cerebral infarction: Secondary | ICD-10-CM | POA: Diagnosis not present

## 2019-07-26 DIAGNOSIS — Z95810 Presence of automatic (implantable) cardiac defibrillator: Secondary | ICD-10-CM

## 2019-07-26 DIAGNOSIS — I480 Paroxysmal atrial fibrillation: Secondary | ICD-10-CM | POA: Diagnosis not present

## 2019-07-26 DIAGNOSIS — E876 Hypokalemia: Secondary | ICD-10-CM | POA: Diagnosis not present

## 2019-07-26 DIAGNOSIS — H3530 Unspecified macular degeneration: Secondary | ICD-10-CM | POA: Diagnosis not present

## 2019-07-26 DIAGNOSIS — Z9181 History of falling: Secondary | ICD-10-CM | POA: Diagnosis not present

## 2019-07-26 DIAGNOSIS — I5022 Chronic systolic (congestive) heart failure: Secondary | ICD-10-CM | POA: Diagnosis not present

## 2019-07-26 DIAGNOSIS — R1312 Dysphagia, oropharyngeal phase: Secondary | ICD-10-CM | POA: Diagnosis not present

## 2019-07-26 DIAGNOSIS — Z87440 Personal history of urinary (tract) infections: Secondary | ICD-10-CM | POA: Diagnosis not present

## 2019-07-26 DIAGNOSIS — I25119 Atherosclerotic heart disease of native coronary artery with unspecified angina pectoris: Secondary | ICD-10-CM | POA: Diagnosis not present

## 2019-07-26 DIAGNOSIS — Z85038 Personal history of other malignant neoplasm of large intestine: Secondary | ICD-10-CM | POA: Diagnosis not present

## 2019-07-26 DIAGNOSIS — I69320 Aphasia following cerebral infarction: Secondary | ICD-10-CM | POA: Diagnosis not present

## 2019-07-26 DIAGNOSIS — I69351 Hemiplegia and hemiparesis following cerebral infarction affecting right dominant side: Secondary | ICD-10-CM | POA: Diagnosis not present

## 2019-07-26 LAB — CUP PACEART REMOTE DEVICE CHECK
Battery Remaining Longevity: 131 mo
Battery Voltage: 3.04 V
Brady Statistic RV Percent Paced: 0.74 %
Date Time Interrogation Session: 20210428033624
HighPow Impedance: 45 Ohm
HighPow Impedance: 57 Ohm
Implantable Lead Implant Date: 20041020
Implantable Lead Implant Date: 20100913
Implantable Lead Location: 753860
Implantable Lead Location: 753860
Implantable Lead Model: 5076
Implantable Lead Model: 6949
Implantable Pulse Generator Implant Date: 20200909
Lead Channel Impedance Value: 304 Ohm
Lead Channel Impedance Value: 456 Ohm
Lead Channel Pacing Threshold Amplitude: 1.25 V
Lead Channel Pacing Threshold Pulse Width: 0.4 ms
Lead Channel Sensing Intrinsic Amplitude: 23.875 mV
Lead Channel Sensing Intrinsic Amplitude: 23.875 mV
Lead Channel Setting Pacing Amplitude: 2.5 V
Lead Channel Setting Pacing Pulse Width: 0.8 ms
Lead Channel Setting Sensing Sensitivity: 0.45 mV

## 2019-07-26 NOTE — Telephone Encounter (Signed)
Ronalee Belts with [facility] health called and was asking about OT orders for once a week for one week and then twice a week for six weeks. He would also like to try some electrical stimulant on his right arm.

## 2019-07-27 ENCOUNTER — Ambulatory Visit (INDEPENDENT_AMBULATORY_CARE_PROVIDER_SITE_OTHER): Payer: Medicare Other | Admitting: Internal Medicine

## 2019-07-27 ENCOUNTER — Encounter: Payer: Self-pay | Admitting: Internal Medicine

## 2019-07-27 ENCOUNTER — Other Ambulatory Visit: Payer: Self-pay

## 2019-07-27 VITALS — BP 100/60 | HR 68 | Temp 97.7°F | Ht 72.0 in | Wt 140.0 lb

## 2019-07-27 DIAGNOSIS — I69391 Dysphagia following cerebral infarction: Secondary | ICD-10-CM | POA: Diagnosis not present

## 2019-07-27 DIAGNOSIS — I10 Essential (primary) hypertension: Secondary | ICD-10-CM

## 2019-07-27 DIAGNOSIS — I5022 Chronic systolic (congestive) heart failure: Secondary | ICD-10-CM | POA: Diagnosis not present

## 2019-07-27 DIAGNOSIS — H353221 Exudative age-related macular degeneration, left eye, with active choroidal neovascularization: Secondary | ICD-10-CM

## 2019-07-27 DIAGNOSIS — H353211 Exudative age-related macular degeneration, right eye, with active choroidal neovascularization: Secondary | ICD-10-CM | POA: Diagnosis not present

## 2019-07-27 DIAGNOSIS — E876 Hypokalemia: Secondary | ICD-10-CM

## 2019-07-27 DIAGNOSIS — S06350D Traumatic hemorrhage of left cerebrum without loss of consciousness, subsequent encounter: Secondary | ICD-10-CM | POA: Diagnosis not present

## 2019-07-27 DIAGNOSIS — D509 Iron deficiency anemia, unspecified: Secondary | ICD-10-CM

## 2019-07-27 LAB — COMPREHENSIVE METABOLIC PANEL
ALT: 12 U/L (ref 0–53)
AST: 17 U/L (ref 0–37)
Albumin: 4 g/dL (ref 3.5–5.2)
Alkaline Phosphatase: 90 U/L (ref 39–117)
BUN: 9 mg/dL (ref 6–23)
CO2: 32 mEq/L (ref 19–32)
Calcium: 10 mg/dL (ref 8.4–10.5)
Chloride: 101 mEq/L (ref 96–112)
Creatinine, Ser: 1.17 mg/dL (ref 0.40–1.50)
GFR: 60.19 mL/min (ref >60.00)
Glucose, Bld: 113 mg/dL — ABNORMAL HIGH (ref 70–99)
Potassium: 4.9 mEq/L (ref 3.5–5.1)
Sodium: 139 mEq/L (ref 135–145)
Total Bilirubin: 1.7 mg/dL — ABNORMAL HIGH (ref 0.2–1.2)
Total Protein: 6.8 g/dL (ref 6.0–8.3)

## 2019-07-27 LAB — CBC
HCT: 40.3 % (ref 39.0–52.0)
Hemoglobin: 13.1 g/dL (ref 13.0–17.0)
MCHC: 32.6 g/dL (ref 30.0–36.0)
MCV: 90.4 fl (ref 78.0–100.0)
Platelets: 193 10*3/uL (ref 150.0–400.0)
RBC: 4.45 Mil/uL (ref 4.22–5.81)
RDW: 18.8 % — ABNORMAL HIGH (ref 11.5–15.5)
WBC: 4.9 10*3/uL (ref 4.0–10.5)

## 2019-07-27 MED ORDER — CARVEDILOL 6.25 MG PO TABS: 6.2500 mg | ORAL_TABLET | Freq: Two times a day (BID) | ORAL | 3 refills | Status: DC

## 2019-07-27 MED ORDER — POTASSIUM CHLORIDE CRYS ER 10 MEQ PO TBCR: 10.0000 meq | EXTENDED_RELEASE_TABLET | Freq: Every day | ORAL | 3 refills | Status: DC

## 2019-07-27 NOTE — Telephone Encounter (Signed)
Fine

## 2019-07-27 NOTE — Patient Instructions (Addendum)
We will go down to 1 potassium pill per day.  We will go down on the carvediolol to 6.25 mg twice a day. We have sent in a new prescription for the 6.25 mg tablets but if you have some of the 12.5 mg tablets you cut them in half until you run out.

## 2019-07-27 NOTE — Progress Notes (Signed)
Subjective:   Patient ID: Mark Clayton, male    DOB: 1940/04/17, 79 y.o.   MRN: [id number]  HPI The patient is a 79 YO man coming in for blood pressure problems. With recent stroke and hemorrhage and recuperating at home he is getting lightheaded with standing. Thinks he may have passed out once while trying to stand as he did fall backwards. No injury. He is eating and drinking well since being home. His coreg dose was doubled in the hospital. Other medications are at same dosing as previous. Weight is still down about 30-40 pounds from usual at this time. He is working with PT/OT and having improvement in stroke symptoms. Denies chest pains or SOB. Denies headaches. Vision is also different but he has significant macular issues and due to prolonged hospital/rehab stay he is behind on his eye injections and his eye specialist he has seen recently and gotten injection. He feels that things are clearing gradually for the right eye. This was not an abrupt change. Denies new/worsening weakness or numbness. Sometimes has a hard time letting go with right hand but grip is strong and not dropping objects. Denies constipation or diarrhea. Denies dark stools or blood in stool. Patient's sister is present and helps to provide history. Some speech changes still which are gradually improving as well.   Review of Systems  Constitutional: Positive for activity change, fatigue and unexpected weight change. Negative for appetite change.  HENT: Negative.   Eyes: Positive for visual disturbance.  Respiratory: Negative for cough, chest tightness and shortness of breath.   Cardiovascular: Negative for chest pain, palpitations and leg swelling.  Gastrointestinal: Negative for abdominal distention, abdominal pain, constipation, diarrhea, nausea and vomiting.  Musculoskeletal: Negative.   Skin: Negative.   Neurological: Positive for syncope, speech difficulty, weakness, light-headedness and numbness. Negative for  dizziness, tremors, seizures, facial asymmetry and headaches.  Psychiatric/Behavioral: Negative.     Objective:  Physical Exam Constitutional:      Appearance: He is well-developed.     Comments: Thin  HENT:     Head: Normocephalic and atraumatic.  Cardiovascular:     Rate and Rhythm: Normal rate.  Pulmonary:     Effort: Pulmonary effort is normal. No respiratory distress.     Breath sounds: Normal breath sounds. No wheezing or rales.  Abdominal:     General: Bowel sounds are normal. There is no distension.     Palpations: Abdomen is soft.     Tenderness: There is no abdominal tenderness. There is no rebound.  Musculoskeletal:     Cervical back: Normal range of motion.  Skin:    General: Skin is warm and dry.  Neurological:     Mental Status: He is alert and oriented to person, place, and time.     Cranial Nerves: Cranial nerve deficit present.     Motor: Weakness present.     Coordination: Coordination abnormal.     Gait: Gait abnormal.     Comments: In wheelchair and able to transfer, right leg with significant weakness and numbness still. Right arm with significant numbness and alteration of sensation. Speech with mild dysarthric changes very understandable and content all appropriate. Memory normal for acute and remote events.      Vitals:   07/27/19 1437 07/27/19 1438  BP: 90/60 100/60  Pulse: 68   Temp: 97.7 F (36.5 C)   SpO2: 99%   Weight: 140 lb (63.5 kg)   Height: 6' (1.829 m)     This  visit occurred during the SARS-CoV-2 public health emergency.  Safety protocols were in place, including screening questions prior to the visit, additional usage of staff PPE, and extensive cleaning of exam room while observing appropriate contact time as indicated for disinfecting solutions.   Assessment & Plan:  Visit time 35 minutes in face to face communication with patient and coordination of care, additional 15 minutes spent in record review, coordination or care,  ordering tests, communicating/referring to other healthcare professionals, documenting in medical records all on the same day of the visit for total time 50 minutes spent on the visit.

## 2019-07-27 NOTE — Progress Notes (Signed)
ICD Remote  

## 2019-07-27 NOTE — Telephone Encounter (Signed)
See message.

## 2019-07-27 NOTE — Telephone Encounter (Signed)
Called lvm with verbal okay with Ronalee Belts [phone]

## 2019-07-28 DIAGNOSIS — H3530 Unspecified macular degeneration: Secondary | ICD-10-CM | POA: Diagnosis not present

## 2019-07-28 DIAGNOSIS — I480 Paroxysmal atrial fibrillation: Secondary | ICD-10-CM | POA: Diagnosis not present

## 2019-07-28 DIAGNOSIS — I69320 Aphasia following cerebral infarction: Secondary | ICD-10-CM | POA: Diagnosis not present

## 2019-07-28 DIAGNOSIS — Z85038 Personal history of other malignant neoplasm of large intestine: Secondary | ICD-10-CM | POA: Diagnosis not present

## 2019-07-28 DIAGNOSIS — Z7901 Long term (current) use of anticoagulants: Secondary | ICD-10-CM | POA: Diagnosis not present

## 2019-07-28 DIAGNOSIS — I110 Hypertensive heart disease with heart failure: Secondary | ICD-10-CM | POA: Diagnosis not present

## 2019-07-28 DIAGNOSIS — I69322 Dysarthria following cerebral infarction: Secondary | ICD-10-CM | POA: Diagnosis not present

## 2019-07-28 DIAGNOSIS — I5043 Acute on chronic combined systolic (congestive) and diastolic (congestive) heart failure: Secondary | ICD-10-CM | POA: Diagnosis not present

## 2019-07-28 DIAGNOSIS — Z7902 Long term (current) use of antithrombotics/antiplatelets: Secondary | ICD-10-CM | POA: Diagnosis not present

## 2019-07-28 DIAGNOSIS — Z9181 History of falling: Secondary | ICD-10-CM | POA: Diagnosis not present

## 2019-07-28 DIAGNOSIS — I69391 Dysphagia following cerebral infarction: Secondary | ICD-10-CM | POA: Diagnosis not present

## 2019-07-28 DIAGNOSIS — Z87440 Personal history of urinary (tract) infections: Secondary | ICD-10-CM | POA: Diagnosis not present

## 2019-07-28 DIAGNOSIS — H548 Legal blindness, as defined in USA: Secondary | ICD-10-CM | POA: Diagnosis not present

## 2019-07-28 DIAGNOSIS — E876 Hypokalemia: Secondary | ICD-10-CM | POA: Diagnosis not present

## 2019-07-28 DIAGNOSIS — R1312 Dysphagia, oropharyngeal phase: Secondary | ICD-10-CM | POA: Diagnosis not present

## 2019-07-28 DIAGNOSIS — I25119 Atherosclerotic heart disease of native coronary artery with unspecified angina pectoris: Secondary | ICD-10-CM | POA: Diagnosis not present

## 2019-07-28 DIAGNOSIS — I69351 Hemiplegia and hemiparesis following cerebral infarction affecting right dominant side: Secondary | ICD-10-CM | POA: Diagnosis not present

## 2019-07-28 DIAGNOSIS — I255 Ischemic cardiomyopathy: Secondary | ICD-10-CM | POA: Diagnosis not present

## 2019-07-28 NOTE — Assessment & Plan Note (Signed)
Recheck potassium today. Given that he is eating well again will reduce potassium to 1 pill daily unless indicated otherwise by labs.

## 2019-07-28 NOTE — Assessment & Plan Note (Signed)
BP is over controlled at this time and he is getting pre-syncopal and syncopal symptoms at home. Will reduce coreg back to home dosing of 6.25 mg BID and new rx sent in for this. Clarified dosing multiple times with patient and his sister.

## 2019-07-28 NOTE — Assessment & Plan Note (Signed)
Following with his eye specialist and they are working on this. I do not feel that his vision changes are related to recent stroke other than he did miss eye injection due to being in hospital.

## 2019-07-28 NOTE — Assessment & Plan Note (Signed)
Eating well and this seems to be resolving.

## 2019-07-28 NOTE — Assessment & Plan Note (Signed)
No evidence for spread at this time. Appears to be doing well and making progress with PT/OT and will continue.

## 2019-07-28 NOTE — Assessment & Plan Note (Signed)
Recheck CBC to ensure stability of Hg given new pre-syncopal and syncopal symptoms.

## 2019-07-28 NOTE — Assessment & Plan Note (Signed)
Stable at this time without flare.

## 2019-07-28 NOTE — Progress Notes (Signed)
EPIC Encounter for ICM Monitoring  Patient Name: Mark Clayton is a 79 y.o. male Date: 07/28/2019 Primary Care Physican: [name], MD Cardiologist:End Electrophysiologist: Caryl Comes 07/28/2019 Weight: 149 lbs  Since 19-Jul-2019  AF  1 Time in AF  24.0 hr/day (100.0%) (taking Xarelto)  OBSERVATIONS   Alert: AF >= 6 hr for 7 days.    Spoke with patient.  He reported he had a stroke in March.  He has some residual from stroke on right side.  Using right foot but still weak and right hand shakes and weak.        He denies any Afib symptoms and he said he usually is able to tell when he is in Afib.  Advised if he experiences any afib symptoms to call the office.  He confirmed he is taking Xarelto.          Message from Chanetta Marshall, NP to refer to Afib clinic if patient is having symptoms and message reply that patient is not having any afib sx at this time.    OptivolThoracic impedancesuggesting possible fluid accumulation for a few days.  He reports increasing fluid intake at the beginning of the week because he feels like he was not swallowing well.  Prescribed:  Furosemide 40 mg 1 tablet daily.   Potassium 10 mEq take 1 tablet daily  Labs: 07/27/2019 Creatinine 1.17, BUN 9, Potassium 4.9, Sodium 139 07/03/2019 Creatinine 1.14, BUN 8, Potassium 4.0, Sodium 131, GFR >60  06/26/2019 Creatinine 1.00, BUN 10, Potassium 4.2, Sodium 127, GFR >60  06/19/2019 Creatinine 1.18, BUN 16, Potassium 3.6, Sodium 138, GFR 59->60  06/15/2019 Creatinine 1.08, BUN 14, Potassium 3.8, Sodium 136, GFR >60  06/14/2019 Creatinine 1.06, BUN 17, Potassium 4.0, Sodium 138, GFR >60  06/13/2019 Creatinine 1.04, BUN 17, Potassium 3.2, Sodium 139, GFR >60  06/10/2019 Creatinine 1.07, BUN 25, Potassium 4.0, Sodium 139, GFR >60  06/09/2019 Creatinine 1.12, BUN 22, Potassium 3.6, Sodium 142, GFR >60  A complete set of results can be found in Results Review.  Recommendations:Advised to  call office for any afib symptoms or fluid accumulation.  Advised to limit fluid intake to 64 oz daily.  Follow-up plan: ICM clinic phone appointment on6/03/2019. 91 day device clinic remote transmission 08/21/2019.   Copy of ICM check sent to [facility].  3 month ICM trend: 07/26/2019    1 Year ICM trend:       Rosalene Billings, RN 07/28/2019 8:23 AM

## 2019-07-31 DIAGNOSIS — E876 Hypokalemia: Secondary | ICD-10-CM | POA: Diagnosis not present

## 2019-07-31 DIAGNOSIS — H3530 Unspecified macular degeneration: Secondary | ICD-10-CM | POA: Diagnosis not present

## 2019-07-31 DIAGNOSIS — I5043 Acute on chronic combined systolic (congestive) and diastolic (congestive) heart failure: Secondary | ICD-10-CM | POA: Diagnosis not present

## 2019-07-31 DIAGNOSIS — Z87440 Personal history of urinary (tract) infections: Secondary | ICD-10-CM | POA: Diagnosis not present

## 2019-07-31 DIAGNOSIS — I25119 Atherosclerotic heart disease of native coronary artery with unspecified angina pectoris: Secondary | ICD-10-CM | POA: Diagnosis not present

## 2019-07-31 DIAGNOSIS — Z85038 Personal history of other malignant neoplasm of large intestine: Secondary | ICD-10-CM | POA: Diagnosis not present

## 2019-07-31 DIAGNOSIS — H548 Legal blindness, as defined in USA: Secondary | ICD-10-CM | POA: Diagnosis not present

## 2019-07-31 DIAGNOSIS — I110 Hypertensive heart disease with heart failure: Secondary | ICD-10-CM | POA: Diagnosis not present

## 2019-07-31 DIAGNOSIS — Z9181 History of falling: Secondary | ICD-10-CM | POA: Diagnosis not present

## 2019-07-31 DIAGNOSIS — Z7901 Long term (current) use of anticoagulants: Secondary | ICD-10-CM | POA: Diagnosis not present

## 2019-07-31 DIAGNOSIS — I69391 Dysphagia following cerebral infarction: Secondary | ICD-10-CM | POA: Diagnosis not present

## 2019-07-31 DIAGNOSIS — I69351 Hemiplegia and hemiparesis following cerebral infarction affecting right dominant side: Secondary | ICD-10-CM | POA: Diagnosis not present

## 2019-07-31 DIAGNOSIS — Z7902 Long term (current) use of antithrombotics/antiplatelets: Secondary | ICD-10-CM | POA: Diagnosis not present

## 2019-07-31 DIAGNOSIS — I69320 Aphasia following cerebral infarction: Secondary | ICD-10-CM | POA: Diagnosis not present

## 2019-07-31 DIAGNOSIS — I480 Paroxysmal atrial fibrillation: Secondary | ICD-10-CM | POA: Diagnosis not present

## 2019-07-31 DIAGNOSIS — I255 Ischemic cardiomyopathy: Secondary | ICD-10-CM | POA: Diagnosis not present

## 2019-07-31 DIAGNOSIS — R1312 Dysphagia, oropharyngeal phase: Secondary | ICD-10-CM | POA: Diagnosis not present

## 2019-07-31 DIAGNOSIS — I69322 Dysarthria following cerebral infarction: Secondary | ICD-10-CM | POA: Diagnosis not present

## 2019-08-01 DIAGNOSIS — I69351 Hemiplegia and hemiparesis following cerebral infarction affecting right dominant side: Secondary | ICD-10-CM | POA: Diagnosis not present

## 2019-08-01 DIAGNOSIS — Z7901 Long term (current) use of anticoagulants: Secondary | ICD-10-CM | POA: Diagnosis not present

## 2019-08-01 DIAGNOSIS — E876 Hypokalemia: Secondary | ICD-10-CM | POA: Diagnosis not present

## 2019-08-01 DIAGNOSIS — Z7902 Long term (current) use of antithrombotics/antiplatelets: Secondary | ICD-10-CM | POA: Diagnosis not present

## 2019-08-01 DIAGNOSIS — I480 Paroxysmal atrial fibrillation: Secondary | ICD-10-CM | POA: Diagnosis not present

## 2019-08-01 DIAGNOSIS — I69391 Dysphagia following cerebral infarction: Secondary | ICD-10-CM | POA: Diagnosis not present

## 2019-08-01 DIAGNOSIS — I255 Ischemic cardiomyopathy: Secondary | ICD-10-CM | POA: Diagnosis not present

## 2019-08-01 DIAGNOSIS — Z87440 Personal history of urinary (tract) infections: Secondary | ICD-10-CM | POA: Diagnosis not present

## 2019-08-01 DIAGNOSIS — Z9181 History of falling: Secondary | ICD-10-CM | POA: Diagnosis not present

## 2019-08-01 DIAGNOSIS — I69322 Dysarthria following cerebral infarction: Secondary | ICD-10-CM | POA: Diagnosis not present

## 2019-08-01 DIAGNOSIS — I5043 Acute on chronic combined systolic (congestive) and diastolic (congestive) heart failure: Secondary | ICD-10-CM | POA: Diagnosis not present

## 2019-08-01 DIAGNOSIS — R1312 Dysphagia, oropharyngeal phase: Secondary | ICD-10-CM | POA: Diagnosis not present

## 2019-08-01 DIAGNOSIS — H3530 Unspecified macular degeneration: Secondary | ICD-10-CM | POA: Diagnosis not present

## 2019-08-01 DIAGNOSIS — Z85038 Personal history of other malignant neoplasm of large intestine: Secondary | ICD-10-CM | POA: Diagnosis not present

## 2019-08-01 DIAGNOSIS — I69320 Aphasia following cerebral infarction: Secondary | ICD-10-CM | POA: Diagnosis not present

## 2019-08-01 DIAGNOSIS — H548 Legal blindness, as defined in USA: Secondary | ICD-10-CM | POA: Diagnosis not present

## 2019-08-01 DIAGNOSIS — I25119 Atherosclerotic heart disease of native coronary artery with unspecified angina pectoris: Secondary | ICD-10-CM | POA: Diagnosis not present

## 2019-08-01 DIAGNOSIS — I110 Hypertensive heart disease with heart failure: Secondary | ICD-10-CM | POA: Diagnosis not present

## 2019-08-02 DIAGNOSIS — Z7901 Long term (current) use of anticoagulants: Secondary | ICD-10-CM | POA: Diagnosis not present

## 2019-08-02 DIAGNOSIS — I69351 Hemiplegia and hemiparesis following cerebral infarction affecting right dominant side: Secondary | ICD-10-CM | POA: Diagnosis not present

## 2019-08-02 DIAGNOSIS — H548 Legal blindness, as defined in USA: Secondary | ICD-10-CM | POA: Diagnosis not present

## 2019-08-02 DIAGNOSIS — I25119 Atherosclerotic heart disease of native coronary artery with unspecified angina pectoris: Secondary | ICD-10-CM | POA: Diagnosis not present

## 2019-08-02 DIAGNOSIS — Z9181 History of falling: Secondary | ICD-10-CM | POA: Diagnosis not present

## 2019-08-02 DIAGNOSIS — I69322 Dysarthria following cerebral infarction: Secondary | ICD-10-CM | POA: Diagnosis not present

## 2019-08-02 DIAGNOSIS — I480 Paroxysmal atrial fibrillation: Secondary | ICD-10-CM | POA: Diagnosis not present

## 2019-08-02 DIAGNOSIS — I69391 Dysphagia following cerebral infarction: Secondary | ICD-10-CM | POA: Diagnosis not present

## 2019-08-02 DIAGNOSIS — Z7902 Long term (current) use of antithrombotics/antiplatelets: Secondary | ICD-10-CM | POA: Diagnosis not present

## 2019-08-02 DIAGNOSIS — I255 Ischemic cardiomyopathy: Secondary | ICD-10-CM | POA: Diagnosis not present

## 2019-08-02 DIAGNOSIS — E876 Hypokalemia: Secondary | ICD-10-CM | POA: Diagnosis not present

## 2019-08-02 DIAGNOSIS — I69320 Aphasia following cerebral infarction: Secondary | ICD-10-CM | POA: Diagnosis not present

## 2019-08-02 DIAGNOSIS — H3530 Unspecified macular degeneration: Secondary | ICD-10-CM | POA: Diagnosis not present

## 2019-08-02 DIAGNOSIS — I110 Hypertensive heart disease with heart failure: Secondary | ICD-10-CM | POA: Diagnosis not present

## 2019-08-02 DIAGNOSIS — Z85038 Personal history of other malignant neoplasm of large intestine: Secondary | ICD-10-CM | POA: Diagnosis not present

## 2019-08-02 DIAGNOSIS — I5043 Acute on chronic combined systolic (congestive) and diastolic (congestive) heart failure: Secondary | ICD-10-CM | POA: Diagnosis not present

## 2019-08-02 DIAGNOSIS — Z87440 Personal history of urinary (tract) infections: Secondary | ICD-10-CM | POA: Diagnosis not present

## 2019-08-02 DIAGNOSIS — R1312 Dysphagia, oropharyngeal phase: Secondary | ICD-10-CM | POA: Diagnosis not present

## 2019-08-03 DIAGNOSIS — Z87440 Personal history of urinary (tract) infections: Secondary | ICD-10-CM | POA: Diagnosis not present

## 2019-08-03 DIAGNOSIS — I69391 Dysphagia following cerebral infarction: Secondary | ICD-10-CM | POA: Diagnosis not present

## 2019-08-03 DIAGNOSIS — Z7901 Long term (current) use of anticoagulants: Secondary | ICD-10-CM | POA: Diagnosis not present

## 2019-08-03 DIAGNOSIS — E876 Hypokalemia: Secondary | ICD-10-CM | POA: Diagnosis not present

## 2019-08-03 DIAGNOSIS — I69322 Dysarthria following cerebral infarction: Secondary | ICD-10-CM | POA: Diagnosis not present

## 2019-08-03 DIAGNOSIS — I5043 Acute on chronic combined systolic (congestive) and diastolic (congestive) heart failure: Secondary | ICD-10-CM | POA: Diagnosis not present

## 2019-08-03 DIAGNOSIS — Z85038 Personal history of other malignant neoplasm of large intestine: Secondary | ICD-10-CM | POA: Diagnosis not present

## 2019-08-03 DIAGNOSIS — H3530 Unspecified macular degeneration: Secondary | ICD-10-CM | POA: Diagnosis not present

## 2019-08-03 DIAGNOSIS — I69320 Aphasia following cerebral infarction: Secondary | ICD-10-CM | POA: Diagnosis not present

## 2019-08-03 DIAGNOSIS — Z7902 Long term (current) use of antithrombotics/antiplatelets: Secondary | ICD-10-CM | POA: Diagnosis not present

## 2019-08-03 DIAGNOSIS — I480 Paroxysmal atrial fibrillation: Secondary | ICD-10-CM | POA: Diagnosis not present

## 2019-08-03 DIAGNOSIS — Z9181 History of falling: Secondary | ICD-10-CM | POA: Diagnosis not present

## 2019-08-03 DIAGNOSIS — R1312 Dysphagia, oropharyngeal phase: Secondary | ICD-10-CM | POA: Diagnosis not present

## 2019-08-03 DIAGNOSIS — I110 Hypertensive heart disease with heart failure: Secondary | ICD-10-CM | POA: Diagnosis not present

## 2019-08-03 DIAGNOSIS — I25119 Atherosclerotic heart disease of native coronary artery with unspecified angina pectoris: Secondary | ICD-10-CM | POA: Diagnosis not present

## 2019-08-03 DIAGNOSIS — H548 Legal blindness, as defined in USA: Secondary | ICD-10-CM | POA: Diagnosis not present

## 2019-08-03 DIAGNOSIS — I69351 Hemiplegia and hemiparesis following cerebral infarction affecting right dominant side: Secondary | ICD-10-CM | POA: Diagnosis not present

## 2019-08-03 DIAGNOSIS — I255 Ischemic cardiomyopathy: Secondary | ICD-10-CM | POA: Diagnosis not present

## 2019-08-04 ENCOUNTER — Telehealth: Payer: Self-pay | Admitting: *Deleted

## 2019-08-04 DIAGNOSIS — I69322 Dysarthria following cerebral infarction: Secondary | ICD-10-CM | POA: Diagnosis not present

## 2019-08-04 DIAGNOSIS — E876 Hypokalemia: Secondary | ICD-10-CM | POA: Diagnosis not present

## 2019-08-04 DIAGNOSIS — Z7901 Long term (current) use of anticoagulants: Secondary | ICD-10-CM | POA: Diagnosis not present

## 2019-08-04 DIAGNOSIS — I480 Paroxysmal atrial fibrillation: Secondary | ICD-10-CM | POA: Diagnosis not present

## 2019-08-04 DIAGNOSIS — I110 Hypertensive heart disease with heart failure: Secondary | ICD-10-CM | POA: Diagnosis not present

## 2019-08-04 DIAGNOSIS — I69391 Dysphagia following cerebral infarction: Secondary | ICD-10-CM | POA: Diagnosis not present

## 2019-08-04 DIAGNOSIS — I69351 Hemiplegia and hemiparesis following cerebral infarction affecting right dominant side: Secondary | ICD-10-CM | POA: Diagnosis not present

## 2019-08-04 DIAGNOSIS — H548 Legal blindness, as defined in USA: Secondary | ICD-10-CM | POA: Diagnosis not present

## 2019-08-04 DIAGNOSIS — I69320 Aphasia following cerebral infarction: Secondary | ICD-10-CM | POA: Diagnosis not present

## 2019-08-04 DIAGNOSIS — I25119 Atherosclerotic heart disease of native coronary artery with unspecified angina pectoris: Secondary | ICD-10-CM | POA: Diagnosis not present

## 2019-08-04 DIAGNOSIS — R1312 Dysphagia, oropharyngeal phase: Secondary | ICD-10-CM | POA: Diagnosis not present

## 2019-08-04 DIAGNOSIS — I5043 Acute on chronic combined systolic (congestive) and diastolic (congestive) heart failure: Secondary | ICD-10-CM | POA: Diagnosis not present

## 2019-08-04 DIAGNOSIS — H3530 Unspecified macular degeneration: Secondary | ICD-10-CM | POA: Diagnosis not present

## 2019-08-04 DIAGNOSIS — Z85038 Personal history of other malignant neoplasm of large intestine: Secondary | ICD-10-CM | POA: Diagnosis not present

## 2019-08-04 DIAGNOSIS — I255 Ischemic cardiomyopathy: Secondary | ICD-10-CM | POA: Diagnosis not present

## 2019-08-04 DIAGNOSIS — Z7902 Long term (current) use of antithrombotics/antiplatelets: Secondary | ICD-10-CM | POA: Diagnosis not present

## 2019-08-04 DIAGNOSIS — Z87440 Personal history of urinary (tract) infections: Secondary | ICD-10-CM | POA: Diagnosis not present

## 2019-08-04 DIAGNOSIS — Z9181 History of falling: Secondary | ICD-10-CM | POA: Diagnosis not present

## 2019-08-04 NOTE — Telephone Encounter (Signed)
LMOVM requesting call back to DC. Direct number and office hours provided.  Per ICD transmission received 08/01/19, patient has been in persistent AF since ~03/2019. Per discussion with Dr. Caryl Comes, will plan to offer appointment on 08/18/19 at 9:15am.

## 2019-08-04 NOTE — Telephone Encounter (Signed)
Patient's daughter, [name]) returned call. She declines appointment on 08/18/19 as pt has f/u with Dr. Marlou Porch on 08/21/19. [name] is agreeable to MyChart video visit on 08/10/19 at 3:30pm. She is aware to have pt's vital signs ready for appointment. Will send telehealth consent via [place]. [name] denies additional questions or concerns at this time.

## 2019-08-07 DIAGNOSIS — I110 Hypertensive heart disease with heart failure: Secondary | ICD-10-CM | POA: Diagnosis not present

## 2019-08-07 DIAGNOSIS — Z85038 Personal history of other malignant neoplasm of large intestine: Secondary | ICD-10-CM | POA: Diagnosis not present

## 2019-08-07 DIAGNOSIS — I480 Paroxysmal atrial fibrillation: Secondary | ICD-10-CM | POA: Diagnosis not present

## 2019-08-07 DIAGNOSIS — H3530 Unspecified macular degeneration: Secondary | ICD-10-CM | POA: Diagnosis not present

## 2019-08-07 DIAGNOSIS — I69320 Aphasia following cerebral infarction: Secondary | ICD-10-CM | POA: Diagnosis not present

## 2019-08-07 DIAGNOSIS — I5043 Acute on chronic combined systolic (congestive) and diastolic (congestive) heart failure: Secondary | ICD-10-CM | POA: Diagnosis not present

## 2019-08-07 DIAGNOSIS — Z7902 Long term (current) use of antithrombotics/antiplatelets: Secondary | ICD-10-CM | POA: Diagnosis not present

## 2019-08-07 DIAGNOSIS — I69351 Hemiplegia and hemiparesis following cerebral infarction affecting right dominant side: Secondary | ICD-10-CM | POA: Diagnosis not present

## 2019-08-07 DIAGNOSIS — Z9181 History of falling: Secondary | ICD-10-CM | POA: Diagnosis not present

## 2019-08-07 DIAGNOSIS — Z7901 Long term (current) use of anticoagulants: Secondary | ICD-10-CM | POA: Diagnosis not present

## 2019-08-07 DIAGNOSIS — I69391 Dysphagia following cerebral infarction: Secondary | ICD-10-CM | POA: Diagnosis not present

## 2019-08-07 DIAGNOSIS — H548 Legal blindness, as defined in USA: Secondary | ICD-10-CM | POA: Diagnosis not present

## 2019-08-07 DIAGNOSIS — Z87440 Personal history of urinary (tract) infections: Secondary | ICD-10-CM | POA: Diagnosis not present

## 2019-08-07 DIAGNOSIS — R1312 Dysphagia, oropharyngeal phase: Secondary | ICD-10-CM | POA: Diagnosis not present

## 2019-08-07 DIAGNOSIS — E876 Hypokalemia: Secondary | ICD-10-CM | POA: Diagnosis not present

## 2019-08-07 DIAGNOSIS — I255 Ischemic cardiomyopathy: Secondary | ICD-10-CM | POA: Diagnosis not present

## 2019-08-07 DIAGNOSIS — I69322 Dysarthria following cerebral infarction: Secondary | ICD-10-CM | POA: Diagnosis not present

## 2019-08-07 DIAGNOSIS — I25119 Atherosclerotic heart disease of native coronary artery with unspecified angina pectoris: Secondary | ICD-10-CM | POA: Diagnosis not present

## 2019-08-08 ENCOUNTER — Inpatient Hospital Stay: Payer: Self-pay | Admitting: Adult Health

## 2019-08-09 DIAGNOSIS — H548 Legal blindness, as defined in USA: Secondary | ICD-10-CM | POA: Diagnosis not present

## 2019-08-09 DIAGNOSIS — I25119 Atherosclerotic heart disease of native coronary artery with unspecified angina pectoris: Secondary | ICD-10-CM | POA: Diagnosis not present

## 2019-08-09 DIAGNOSIS — I5043 Acute on chronic combined systolic (congestive) and diastolic (congestive) heart failure: Secondary | ICD-10-CM | POA: Diagnosis not present

## 2019-08-09 DIAGNOSIS — I480 Paroxysmal atrial fibrillation: Secondary | ICD-10-CM | POA: Diagnosis not present

## 2019-08-09 DIAGNOSIS — I69391 Dysphagia following cerebral infarction: Secondary | ICD-10-CM | POA: Diagnosis not present

## 2019-08-09 DIAGNOSIS — I110 Hypertensive heart disease with heart failure: Secondary | ICD-10-CM | POA: Diagnosis not present

## 2019-08-09 DIAGNOSIS — Z87440 Personal history of urinary (tract) infections: Secondary | ICD-10-CM | POA: Diagnosis not present

## 2019-08-09 DIAGNOSIS — I69320 Aphasia following cerebral infarction: Secondary | ICD-10-CM | POA: Diagnosis not present

## 2019-08-09 DIAGNOSIS — R1312 Dysphagia, oropharyngeal phase: Secondary | ICD-10-CM | POA: Diagnosis not present

## 2019-08-09 DIAGNOSIS — Z85038 Personal history of other malignant neoplasm of large intestine: Secondary | ICD-10-CM | POA: Diagnosis not present

## 2019-08-09 DIAGNOSIS — I255 Ischemic cardiomyopathy: Secondary | ICD-10-CM | POA: Diagnosis not present

## 2019-08-09 DIAGNOSIS — Z7902 Long term (current) use of antithrombotics/antiplatelets: Secondary | ICD-10-CM | POA: Diagnosis not present

## 2019-08-09 DIAGNOSIS — H3530 Unspecified macular degeneration: Secondary | ICD-10-CM | POA: Diagnosis not present

## 2019-08-09 DIAGNOSIS — Z7901 Long term (current) use of anticoagulants: Secondary | ICD-10-CM | POA: Diagnosis not present

## 2019-08-09 DIAGNOSIS — E876 Hypokalemia: Secondary | ICD-10-CM | POA: Diagnosis not present

## 2019-08-09 DIAGNOSIS — Z9181 History of falling: Secondary | ICD-10-CM | POA: Diagnosis not present

## 2019-08-09 DIAGNOSIS — I69322 Dysarthria following cerebral infarction: Secondary | ICD-10-CM | POA: Diagnosis not present

## 2019-08-09 DIAGNOSIS — I69351 Hemiplegia and hemiparesis following cerebral infarction affecting right dominant side: Secondary | ICD-10-CM | POA: Diagnosis not present

## 2019-08-10 ENCOUNTER — Other Ambulatory Visit: Payer: Self-pay

## 2019-08-10 ENCOUNTER — Telehealth: Payer: Self-pay

## 2019-08-10 ENCOUNTER — Telehealth (INDEPENDENT_AMBULATORY_CARE_PROVIDER_SITE_OTHER): Payer: Medicare Other | Admitting: Internal Medicine

## 2019-08-10 VITALS — BP 126/60 | HR 72

## 2019-08-10 DIAGNOSIS — I4819 Other persistent atrial fibrillation: Secondary | ICD-10-CM

## 2019-08-10 DIAGNOSIS — Z7901 Long term (current) use of anticoagulants: Secondary | ICD-10-CM | POA: Diagnosis not present

## 2019-08-10 DIAGNOSIS — I25119 Atherosclerotic heart disease of native coronary artery with unspecified angina pectoris: Secondary | ICD-10-CM | POA: Diagnosis not present

## 2019-08-10 DIAGNOSIS — Z95810 Presence of automatic (implantable) cardiac defibrillator: Secondary | ICD-10-CM

## 2019-08-10 DIAGNOSIS — I5022 Chronic systolic (congestive) heart failure: Secondary | ICD-10-CM

## 2019-08-10 DIAGNOSIS — I110 Hypertensive heart disease with heart failure: Secondary | ICD-10-CM | POA: Diagnosis not present

## 2019-08-10 DIAGNOSIS — I493 Ventricular premature depolarization: Secondary | ICD-10-CM

## 2019-08-10 DIAGNOSIS — I480 Paroxysmal atrial fibrillation: Secondary | ICD-10-CM | POA: Diagnosis not present

## 2019-08-10 DIAGNOSIS — R1312 Dysphagia, oropharyngeal phase: Secondary | ICD-10-CM | POA: Diagnosis not present

## 2019-08-10 DIAGNOSIS — E876 Hypokalemia: Secondary | ICD-10-CM | POA: Diagnosis not present

## 2019-08-10 DIAGNOSIS — I255 Ischemic cardiomyopathy: Secondary | ICD-10-CM | POA: Diagnosis not present

## 2019-08-10 DIAGNOSIS — H548 Legal blindness, as defined in USA: Secondary | ICD-10-CM | POA: Diagnosis not present

## 2019-08-10 DIAGNOSIS — Z7902 Long term (current) use of antithrombotics/antiplatelets: Secondary | ICD-10-CM | POA: Diagnosis not present

## 2019-08-10 DIAGNOSIS — I69391 Dysphagia following cerebral infarction: Secondary | ICD-10-CM | POA: Diagnosis not present

## 2019-08-10 DIAGNOSIS — Z9181 History of falling: Secondary | ICD-10-CM | POA: Diagnosis not present

## 2019-08-10 DIAGNOSIS — H3530 Unspecified macular degeneration: Secondary | ICD-10-CM | POA: Diagnosis not present

## 2019-08-10 DIAGNOSIS — Z85038 Personal history of other malignant neoplasm of large intestine: Secondary | ICD-10-CM | POA: Diagnosis not present

## 2019-08-10 DIAGNOSIS — I69351 Hemiplegia and hemiparesis following cerebral infarction affecting right dominant side: Secondary | ICD-10-CM | POA: Diagnosis not present

## 2019-08-10 DIAGNOSIS — R001 Bradycardia, unspecified: Secondary | ICD-10-CM

## 2019-08-10 DIAGNOSIS — I69320 Aphasia following cerebral infarction: Secondary | ICD-10-CM | POA: Diagnosis not present

## 2019-08-10 DIAGNOSIS — I69322 Dysarthria following cerebral infarction: Secondary | ICD-10-CM | POA: Diagnosis not present

## 2019-08-10 DIAGNOSIS — I5043 Acute on chronic combined systolic (congestive) and diastolic (congestive) heart failure: Secondary | ICD-10-CM | POA: Diagnosis not present

## 2019-08-10 DIAGNOSIS — Z87440 Personal history of urinary (tract) infections: Secondary | ICD-10-CM | POA: Diagnosis not present

## 2019-08-10 NOTE — Patient Instructions (Signed)
Medication Instructions:  Your physician recommends that you continue on your current medications as directed. Please refer to the Current Medication list given to you today.  *If you need a refill on your cardiac medications before your next appointment, please call your pharmacy*   Lab Work: None ordered.  If you have labs (blood work) drawn today and your tests are completely normal, you will receive your results only by: Marland Kitchen MyChart Message (if you have MyChart) OR . A paper copy in the mail If you have any lab test that is abnormal or we need to change your treatment, we will call you to review the results.   Testing/Procedures: None ordered.    Follow-Up: At [facility], you and your health needs are our priority.  As part of our continuing mission to provide you with exceptional heart care, we have created designated Provider Care Teams.  These Care Teams include your primary Cardiologist (physician) and Advanced Practice Providers (APPs -  Physician Assistants and Nurse Practitioners) who all work together to provide you with the care you need, when you need it.  We recommend signing up for the patient portal called "MyChart".  Sign up information is provided on this After Visit Summary.  MyChart is used to connect with patients for Virtual Visits (Telemedicine).  Patients are able to view lab/test results, encounter notes, upcoming appointments, etc.  Non-urgent messages can be sent to your provider as well.   To learn more about what you can do with MyChart, go to [email or web address].    Your next appointment:   Monday, 11/13/2019 at 1130am  The format for your next appointment:   In Person  Provider:   Dr Caryl Comes

## 2019-08-10 NOTE — Telephone Encounter (Signed)
  Patient Consent for Virtual Visit         Mark Clayton has provided verbal consent on 08/10/2019 for a virtual visit (video or telephone).   CONSENT FOR VIRTUAL VISIT FOR:  Mark Clayton  By participating in this virtual visit I agree to the following:  I hereby voluntarily request, consent and authorize [facility] and its employed or contracted physicians, physician assistants, nurse practitioners or other licensed health care professionals (the Practitioner), to provide me with telemedicine health care services (the "Services") as deemed necessary by the treating Practitioner. I acknowledge and consent to receive the Services by the Practitioner via telemedicine. I understand that the telemedicine visit will involve communicating with the Practitioner through live audiovisual communication technology and the disclosure of certain medical information by electronic transmission. I acknowledge that I have been given the opportunity to request an in-person assessment or other available alternative prior to the telemedicine visit and am voluntarily participating in the telemedicine visit.  I understand that I have the right to withhold or withdraw my consent to the use of telemedicine in the course of my care at any time, without affecting my right to future care or treatment, and that the Practitioner or I may terminate the telemedicine visit at any time. I understand that I have the right to inspect all information obtained and/or recorded in the course of the telemedicine visit and may receive copies of available information for a reasonable fee.  I understand that some of the potential risks of receiving the Services via telemedicine include:  Marland Kitchen Delay or interruption in medical evaluation due to technological equipment failure or disruption; . Information transmitted may not be sufficient (e.g. poor resolution of images) to allow for appropriate medical decision making by the Practitioner;  and/or  . In rare instances, security protocols could fail, causing a breach of personal health information.  Furthermore, I acknowledge that it is my responsibility to provide information about my medical history, conditions and care that is complete and accurate to the best of my ability. I acknowledge that Practitioner's advice, recommendations, and/or decision may be based on factors not within their control, such as incomplete or inaccurate data provided by me or distortions of diagnostic images or specimens that may result from electronic transmissions. I understand that the practice of medicine is not an exact science and that Practitioner makes no warranties or guarantees regarding treatment outcomes. I acknowledge that a copy of this consent can be made available to me via my patient portal ([facility]), or I can request a printed copy by calling the office of [facility].    I understand that my insurance will be billed for this visit.   I have read or had this consent read to me. . I understand the contents of this consent, which adequately explains the benefits and risks of the Services being provided via telemedicine.  . I have been provided ample opportunity to ask questions regarding this consent and the Services and have had my questions answered to my satisfaction. . I give my informed consent for the services to be provided through the use of telemedicine in my medical care

## 2019-08-10 NOTE — Progress Notes (Signed)
Electrophysiology TeleHealth Note   Due to national recommendations of social distancing due to COVID 19, an audio/video telehealth visit is felt to be most appropriate for this patient at this time.  See MyChart message from today for the patient's consent to telehealth for [facility].   Date:  08/10/2019   ID:  Mark Clayton, DOB 1940/10/19, MRN [id number]  Location: patient's home  Provider location: [address], [place]  Evaluation Performed: Follow-up visit  PCP:  [name], MD  Cardiologist:     Electrophysiologist:  SK   Chief Complaint:  Ischemic cardiomyopathy  Afib and interval stroke   History of Present Illness:    Mark Clayton is a 79 y.o. male who presents via audio/video conferencing for a telehealth visit today.  Since last being seen in our clinic for ICM and ICD- the patient reports *having had a stroke presumed embolic and is improving with PT; weight improving No shortness of breath  Now walking and standing on one feet  Sleeping well   No edema   No bleeding   Date Cr K Hgb  4/21 1.17 4.9 13.1           The patient denies symptoms of fevers, chills, cough, or new SOB worrisome for COVID 19.   Past Medical History:  Diagnosis Date  . AICD (automatic cardioverter/defibrillator) present 01/17/2003   Medtronic Maximo 7232CX ICD, serial [id number]  . Anemia 02-06-11   takes oral iron  . Arthritis    hands, knees  . CAD (coronary artery disease) 2003   a. h/o MI and CABG in 2003. b. s/p DES to SVG-RPDA-RPLB in 08/2014.  Marland Kitchen Cancer of sigmoid colon ([place]) 2012   a. s/p colon surgery.  . Carotid bruit   . Chronic systolic CHF (congestive heart failure) (HCC)    a. EF 20% in 2014; b. 08/2017 Echo: EF 20-25%, diff HK, Gr3 DD. Triv AI. Mod MR. Sev dil LA. Mildly dil RV w/ mildly reduced RV fxn. Mildly dil RA. Mod TR. PASP 32mHg.  Marland Kitchen Cough   . GERD (gastroesophageal reflux disease) 02-06-11  . HTN (hypertension)   .  Hyperlipidemia   . Ischemic cardiomyopathy    a. EF 20% in 2014. (Master study EF >20%); b. 08/2017 Echo: EF 20-25%, diff HK. Gr3 DD.  . LV (left ventricular) mural thrombus    a. 12/2012 Echo: EF 20% with mural thrombus No evidence of thrombus on 08/2017 echo.  . Macular degeneration   . Myocardial infarct ([place])    2003  . PAF (paroxysmal atrial fibrillation) (HCC)    a. CHA2DS2VASc = 5-->Xarelto/Tikosyn.    Past Surgical History:  Procedure Laterality Date  . CARDIAC CATHETERIZATION N/A 09/20/2014   Procedure: Left Heart Cath and Coronary Angiography;  Surgeon: Jettie Booze, MD; LAD 95%, D1 100%, CFX liner percent, OM 200%, OM 390%, RCA 90%, LIMA-LAD okay, SVG-OM 2-OM 3 minimal disease, SVG-RPDA-RPLB 100% between the RPDA and RPL     . CARDIAC CATHETERIZATION N/A 09/20/2014   Procedure: Coronary Stent Intervention;  Surgeon: Jettie Booze, MD; Synergy DES 4 x 24 mm reducing the stenosis to 5%   . CARDIAC DEFIBRILLATOR PLACEMENT  01/17/03   6949 lead. Medtronic. remote-no; with later revision  . CARDIOVERSION N/A 05/22/2016   Procedure: CARDIOVERSION;  Surgeon: Lelon Perla, MD;  Location: [facility] ENDOSCOPY;  Service: Cardiovascular;  Laterality: N/A;  . CARDIOVERSION N/A 08/10/2017   Procedure: CARDIOVERSION;  Surgeon: Lyman Bishop  C, MD;  Location: [facility] ENDOSCOPY;  Service: Cardiovascular;  Laterality: N/A;  . CATARACT EXTRACTION W/ INTRAOCULAR LENS  IMPLANT, BILATERAL Bilateral June/-July 2009   Dr. Katy Fitch  . CATARACT EXTRACTION W/PHACO Bilateral 2010   Dr. Katy Fitch  . COLON RESECTION  02/09/2011   Procedure: LAPAROSCOPIC SIGMOID COLON RESECTION;  Surgeon: Pedro Earls, MD;  Location: [facility] ORS;  Service: General;  Laterality: N/A;  Laparoscopic Assisted Sigmoid Colectomy  . COLON SURGERY    . COLONOSCOPY  08/31/2011   Procedure: COLONOSCOPY;  Surgeon: Jerene Bears, MD;  Location: [facility] ENDOSCOPY;  Service: Gastroenterology;  Laterality: N/A;  . COLONOSCOPY N/A 09/05/2012    Procedure: COLONOSCOPY;  Surgeon: Jerene Bears, MD;  Location: [facility] ENDOSCOPY;  Service: Gastroenterology;  Laterality: N/A;  . COLONOSCOPY N/A 04/18/2013   Procedure: COLONOSCOPY;  Surgeon: Jerene Bears, MD;  Location: [facility] ENDOSCOPY;  Service: Gastroenterology;  Laterality: N/A;  . COLONOSCOPY N/A 04/09/2014   Procedure: COLONOSCOPY;  Surgeon: Jerene Bears, MD;  Location: [facility] ENDOSCOPY;  Service: Gastroenterology;  Laterality: N/A;  . COLONOSCOPY WITH PROPOFOL N/A 05/03/2017   Procedure: COLONOSCOPY WITH PROPOFOL;  Surgeon: Jerene Bears, MD;  Location: [facility] ENDOSCOPY;  Service: Gastroenterology;  Laterality: N/A;  . CORONARY ANGIOPLASTY    . CORONARY ARTERY BYPASS GRAFT  01/2002   LIMA-LAD, SVG-OM 2-OM 3, SVG-RPDA-RPLB  . CORONARY STENT INTERVENTION N/A 11/22/2018   Procedure: CORONARY STENT INTERVENTION;  Surgeon: Nelva Bush, MD;  Location: [facility] CV LAB;  Service: Cardiovascular;  Laterality: N/A;  SVG - RCA  . ICD GENERATOR CHANGE  2010   Medtronic Virtuoso II VR ICD  . ICD GENERATOR CHANGEOUT N/A 12/07/2018   Procedure: ICD GENERATOR CHANGEOUT;  Surgeon: Deboraha Sprang, MD;  Location: [facility] CV LAB;  Service: Cardiovascular;  Laterality: N/A;  . INGUINAL HERNIA REPAIR Right 2000's X 2  . LAPAROSCOPIC RIGHT HEMI COLECTOMY N/A 11/04/2012   Procedure: LAPAROSCOPIC RIGHT HEMI COLECTOMY;  Surgeon: Pedro Earls, MD;  Location: [facility] ORS;  Service: General;  Laterality: N/A;  . LEFT HEART CATH AND CORS/GRAFTS ANGIOGRAPHY Left 11/22/2018   Procedure: LEFT HEART CATH AND CORS/GRAFTS ANGIOGRAPHY;  Surgeon: Nelva Bush, MD;  Location: [facility] CV LAB;  Service: Cardiovascular;  Laterality: Left;  . TONSILLECTOMY  ~ 1950    Current Outpatient Medications  Medication Sig Dispense Refill  . acetaminophen (TYLENOL) 325 MG tablet Take 1-2 tablets (325-650 mg total) by mouth every 4 (four) hours as needed for mild pain.    Marland Kitchen atorvastatin (LIPITOR) 80 MG tablet Take 1 tablet (80 mg  total) by mouth daily. 90 tablet 3  . carvedilol (COREG) 6.25 MG tablet Take 1 tablet (6.25 mg total) by mouth 2 (two) times daily with a meal. 180 tablet 3  . clopidogrel (PLAVIX) 75 MG tablet Take 1 tablet (75 mg total) by mouth daily.    Marland Kitchen ezetimibe (ZETIA) 10 MG tablet Take 1 tablet (10 mg total) by mouth daily. 90 tablet 3  . furosemide (LASIX) 40 MG tablet TAKE 1 TABLET BY MOUTH  DAILY 90 tablet 3  . isosorbide mononitrate (IMDUR) 30 MG 24 hr tablet TAKE 1 TABLET BY MOUTH  DAILY 90 tablet 3  . meclizine (ANTIVERT) 12.5 MG tablet Take 1 tablet (12.5 mg total) by mouth 2 (two) times daily as needed for dizziness. 30 tablet 0  . nitroGLYCERIN (NITROSTAT) 0.4 MG SL tablet     . pantoprazole (PROTONIX) 40 MG tablet TAKE 1 TABLET BY MOUTH  DAILY 90  tablet 1  . polyethylene glycol (MIRALAX / GLYCOLAX) 17 g packet Take 17 g by mouth daily. 14 each 0  . potassium chloride (KLOR-CON) 10 MEQ tablet Take 1 tablet (10 mEq total) by mouth daily. 90 tablet 3  . rivaroxaban (XARELTO) 20 MG TABS tablet Take 1 tablet (20 mg total) by mouth daily with supper. 30 tablet 3  . vitamin B-12 (CYANOCOBALAMIN) 100 MCG tablet Take 100 mcg by mouth daily.     Marland Kitchen losartan (COZAAR) 50 MG tablet Take 50 mg by mouth daily.     No current facility-administered medications for this visit.    Allergies:   Latex and Tape   Social History:  The patient  reports that he has never smoked. He has never used smokeless tobacco. He reports current alcohol use of about 2.0 standard drinks of alcohol per week. He reports that he does not use drugs.   Family History:  The patient's   family history includes Alzheimer's disease in his mother; Heart disease in his father; Hyperlipidemia in his father and sister; Hypertension in his father and sister; Prostate cancer in his father.   ROS:  Please see the history of present illness.   All other systems are personally reviewed and negative.    Exam:    Vital Signs:  BP 126/60    Pulse 72     Well appearing, alert and conversant, regular work of breathing,  good skin color Eyes- anicteric, neuro- grossly intact, skin- no apparent rash or lesions or cyanosis, mouth- oral mucosa is pink   Labs/Other Tests and Data Reviewed:    Recent Labs: 08/26/2018: TSH 4.88 03/13/2019: Pro B Natriuretic peptide (BNP) 410.0 06/14/2019: Magnesium 2.2 07/27/2019: ALT 12; BUN 9; Creatinine, Ser 1.17; Hemoglobin 13.1; Platelets 193.0; Potassium 4.9; Sodium 139   Wt Readings from Last 3 Encounters:  07/27/19 140 lb (63.5 kg)  07/20/19 148 lb (67.1 kg)  07/07/19 137 lb 12.6 oz (62.5 kg)     Other studies personally reviewed: Additional studies/ records that were reviewed today include:   Last device remote is reviewed from [facility] PDF dated 4/21 which reveals normal device function,   arrhythmias - persistent Afib assoc with marked decrease in activity   ASSESSMENT & PLAN:   Ischemic cardiomyopathy  Implantable defibrillator-Medtronic-single chamber-   Hypertension   IVCD  Atrial fibrillation persistent  Congestive heart failure-chronic-systolic  PVCs  Bradycardia   Afib persistent without significant symptoms so will plan to leave him in afib-- HR is reasonably controlled  No bleeding  Progress post stroke  Euvolemic continue current meds   Without symptoms of ischemia      COVID 19 screen The patient denies symptoms of COVID 19 at this time.  The importance of social distancing was discussed today.  Follow-up:  39m ov Next remote: As Scheduled   Current medicines are reviewed at length with the patient today.   The patient does not have concerns regarding his medicines.  The following changes were made today:  none  Labs/ tests ordered today include:   No orders of the defined types were placed in this encounter.   Future tests ( post COVID )     Patient Risk:  after full review of this patients clinical status, I feel that they are at  moderate risk at this time.  Today, I have spent14 minutes with the patient with telehealth technology discussing the above.  Signed, Virl Axe, MD  08/10/2019 3:45 PM     [facility]  [address] [place] [address] [phone] (office) [phone] (fax)

## 2019-08-11 DIAGNOSIS — I69320 Aphasia following cerebral infarction: Secondary | ICD-10-CM | POA: Diagnosis not present

## 2019-08-11 DIAGNOSIS — H548 Legal blindness, as defined in USA: Secondary | ICD-10-CM | POA: Diagnosis not present

## 2019-08-11 DIAGNOSIS — I255 Ischemic cardiomyopathy: Secondary | ICD-10-CM | POA: Diagnosis not present

## 2019-08-11 DIAGNOSIS — H3530 Unspecified macular degeneration: Secondary | ICD-10-CM | POA: Diagnosis not present

## 2019-08-11 DIAGNOSIS — I25119 Atherosclerotic heart disease of native coronary artery with unspecified angina pectoris: Secondary | ICD-10-CM | POA: Diagnosis not present

## 2019-08-11 DIAGNOSIS — I69351 Hemiplegia and hemiparesis following cerebral infarction affecting right dominant side: Secondary | ICD-10-CM | POA: Diagnosis not present

## 2019-08-11 DIAGNOSIS — Z7902 Long term (current) use of antithrombotics/antiplatelets: Secondary | ICD-10-CM | POA: Diagnosis not present

## 2019-08-11 DIAGNOSIS — Z9181 History of falling: Secondary | ICD-10-CM | POA: Diagnosis not present

## 2019-08-11 DIAGNOSIS — I69322 Dysarthria following cerebral infarction: Secondary | ICD-10-CM | POA: Diagnosis not present

## 2019-08-11 DIAGNOSIS — I5043 Acute on chronic combined systolic (congestive) and diastolic (congestive) heart failure: Secondary | ICD-10-CM | POA: Diagnosis not present

## 2019-08-11 DIAGNOSIS — I110 Hypertensive heart disease with heart failure: Secondary | ICD-10-CM | POA: Diagnosis not present

## 2019-08-11 DIAGNOSIS — I480 Paroxysmal atrial fibrillation: Secondary | ICD-10-CM | POA: Diagnosis not present

## 2019-08-11 DIAGNOSIS — I69391 Dysphagia following cerebral infarction: Secondary | ICD-10-CM | POA: Diagnosis not present

## 2019-08-11 DIAGNOSIS — Z87440 Personal history of urinary (tract) infections: Secondary | ICD-10-CM | POA: Diagnosis not present

## 2019-08-11 DIAGNOSIS — Z85038 Personal history of other malignant neoplasm of large intestine: Secondary | ICD-10-CM | POA: Diagnosis not present

## 2019-08-11 DIAGNOSIS — E876 Hypokalemia: Secondary | ICD-10-CM | POA: Diagnosis not present

## 2019-08-11 DIAGNOSIS — Z7901 Long term (current) use of anticoagulants: Secondary | ICD-10-CM | POA: Diagnosis not present

## 2019-08-11 DIAGNOSIS — R1312 Dysphagia, oropharyngeal phase: Secondary | ICD-10-CM | POA: Diagnosis not present

## 2019-08-13 ENCOUNTER — Other Ambulatory Visit: Payer: Self-pay | Admitting: Internal Medicine

## 2019-08-14 DIAGNOSIS — I69322 Dysarthria following cerebral infarction: Secondary | ICD-10-CM | POA: Diagnosis not present

## 2019-08-14 DIAGNOSIS — E876 Hypokalemia: Secondary | ICD-10-CM | POA: Diagnosis not present

## 2019-08-14 DIAGNOSIS — I69320 Aphasia following cerebral infarction: Secondary | ICD-10-CM | POA: Diagnosis not present

## 2019-08-14 DIAGNOSIS — Z9181 History of falling: Secondary | ICD-10-CM | POA: Diagnosis not present

## 2019-08-14 DIAGNOSIS — Z7902 Long term (current) use of antithrombotics/antiplatelets: Secondary | ICD-10-CM | POA: Diagnosis not present

## 2019-08-14 DIAGNOSIS — I5043 Acute on chronic combined systolic (congestive) and diastolic (congestive) heart failure: Secondary | ICD-10-CM | POA: Diagnosis not present

## 2019-08-14 DIAGNOSIS — I69351 Hemiplegia and hemiparesis following cerebral infarction affecting right dominant side: Secondary | ICD-10-CM | POA: Diagnosis not present

## 2019-08-14 DIAGNOSIS — H548 Legal blindness, as defined in USA: Secondary | ICD-10-CM | POA: Diagnosis not present

## 2019-08-14 DIAGNOSIS — I110 Hypertensive heart disease with heart failure: Secondary | ICD-10-CM | POA: Diagnosis not present

## 2019-08-14 DIAGNOSIS — I69391 Dysphagia following cerebral infarction: Secondary | ICD-10-CM | POA: Diagnosis not present

## 2019-08-14 DIAGNOSIS — I255 Ischemic cardiomyopathy: Secondary | ICD-10-CM | POA: Diagnosis not present

## 2019-08-14 DIAGNOSIS — H3530 Unspecified macular degeneration: Secondary | ICD-10-CM | POA: Diagnosis not present

## 2019-08-14 DIAGNOSIS — I480 Paroxysmal atrial fibrillation: Secondary | ICD-10-CM | POA: Diagnosis not present

## 2019-08-14 DIAGNOSIS — Z85038 Personal history of other malignant neoplasm of large intestine: Secondary | ICD-10-CM | POA: Diagnosis not present

## 2019-08-14 DIAGNOSIS — Z7901 Long term (current) use of anticoagulants: Secondary | ICD-10-CM | POA: Diagnosis not present

## 2019-08-14 DIAGNOSIS — I25119 Atherosclerotic heart disease of native coronary artery with unspecified angina pectoris: Secondary | ICD-10-CM | POA: Diagnosis not present

## 2019-08-14 DIAGNOSIS — R1312 Dysphagia, oropharyngeal phase: Secondary | ICD-10-CM | POA: Diagnosis not present

## 2019-08-14 DIAGNOSIS — Z87440 Personal history of urinary (tract) infections: Secondary | ICD-10-CM | POA: Diagnosis not present

## 2019-08-15 DIAGNOSIS — H548 Legal blindness, as defined in USA: Secondary | ICD-10-CM | POA: Diagnosis not present

## 2019-08-15 DIAGNOSIS — I69391 Dysphagia following cerebral infarction: Secondary | ICD-10-CM | POA: Diagnosis not present

## 2019-08-15 DIAGNOSIS — I255 Ischemic cardiomyopathy: Secondary | ICD-10-CM | POA: Diagnosis not present

## 2019-08-15 DIAGNOSIS — I480 Paroxysmal atrial fibrillation: Secondary | ICD-10-CM | POA: Diagnosis not present

## 2019-08-15 DIAGNOSIS — I69320 Aphasia following cerebral infarction: Secondary | ICD-10-CM | POA: Diagnosis not present

## 2019-08-15 DIAGNOSIS — Z7902 Long term (current) use of antithrombotics/antiplatelets: Secondary | ICD-10-CM | POA: Diagnosis not present

## 2019-08-15 DIAGNOSIS — Z87440 Personal history of urinary (tract) infections: Secondary | ICD-10-CM | POA: Diagnosis not present

## 2019-08-15 DIAGNOSIS — I5043 Acute on chronic combined systolic (congestive) and diastolic (congestive) heart failure: Secondary | ICD-10-CM | POA: Diagnosis not present

## 2019-08-15 DIAGNOSIS — Z9181 History of falling: Secondary | ICD-10-CM | POA: Diagnosis not present

## 2019-08-15 DIAGNOSIS — Z7901 Long term (current) use of anticoagulants: Secondary | ICD-10-CM | POA: Diagnosis not present

## 2019-08-15 DIAGNOSIS — H3530 Unspecified macular degeneration: Secondary | ICD-10-CM | POA: Diagnosis not present

## 2019-08-15 DIAGNOSIS — R1312 Dysphagia, oropharyngeal phase: Secondary | ICD-10-CM | POA: Diagnosis not present

## 2019-08-15 DIAGNOSIS — I69322 Dysarthria following cerebral infarction: Secondary | ICD-10-CM | POA: Diagnosis not present

## 2019-08-15 DIAGNOSIS — I110 Hypertensive heart disease with heart failure: Secondary | ICD-10-CM | POA: Diagnosis not present

## 2019-08-15 DIAGNOSIS — Z85038 Personal history of other malignant neoplasm of large intestine: Secondary | ICD-10-CM | POA: Diagnosis not present

## 2019-08-15 DIAGNOSIS — I69351 Hemiplegia and hemiparesis following cerebral infarction affecting right dominant side: Secondary | ICD-10-CM | POA: Diagnosis not present

## 2019-08-15 DIAGNOSIS — I25119 Atherosclerotic heart disease of native coronary artery with unspecified angina pectoris: Secondary | ICD-10-CM | POA: Diagnosis not present

## 2019-08-15 DIAGNOSIS — E876 Hypokalemia: Secondary | ICD-10-CM | POA: Diagnosis not present

## 2019-08-16 ENCOUNTER — Other Ambulatory Visit: Payer: Self-pay

## 2019-08-16 ENCOUNTER — Other Ambulatory Visit: Payer: Medicare Other

## 2019-08-16 ENCOUNTER — Ambulatory Visit (INDEPENDENT_AMBULATORY_CARE_PROVIDER_SITE_OTHER): Payer: Medicare Other | Admitting: Internal Medicine

## 2019-08-16 ENCOUNTER — Encounter: Payer: Self-pay | Admitting: Internal Medicine

## 2019-08-16 DIAGNOSIS — R471 Dysarthria and anarthria: Secondary | ICD-10-CM

## 2019-08-16 DIAGNOSIS — Z8673 Personal history of transient ischemic attack (TIA), and cerebral infarction without residual deficits: Secondary | ICD-10-CM | POA: Diagnosis not present

## 2019-08-16 DIAGNOSIS — I4819 Other persistent atrial fibrillation: Secondary | ICD-10-CM | POA: Diagnosis not present

## 2019-08-16 DIAGNOSIS — I10 Essential (primary) hypertension: Secondary | ICD-10-CM | POA: Diagnosis not present

## 2019-08-16 NOTE — Assessment & Plan Note (Signed)
Still working with PT/OT at home and right arm doing much better. Right leg with persistent weakness. Needs RLE AFO and rx given today for fitting.

## 2019-08-16 NOTE — Progress Notes (Signed)
   Subjective:   Patient ID: Mark Clayton, male    DOB: 10-11-1940, 79 y.o.   MRN: [id number]  HPI The patient is a 79 YO man coming in for follow up progress with recent stroke. He is still working with PT/OT in the home. He is improving gradually. Drinking 2 ensure daily to help with nutritional intake although appetite is good. Denies new or worsening numbness or weakness. Still having some instability right ankle and PT has recommended a brace to help with stability. Overall it is improving but not walking independently. He is standing on his own for 15 minutes off and on. He has switched coreg to 6.25 mg BID and is not having any more pre-syncope. Much improved energy levels and no dizziness on standing. Vision is still poor but stable.   Review of Systems  Constitutional: Negative.   HENT: Negative.   Eyes: Positive for visual disturbance.  Respiratory: Negative for cough, chest tightness and shortness of breath.   Cardiovascular: Negative for chest pain, palpitations and leg swelling.  Gastrointestinal: Negative for abdominal distention, abdominal pain, constipation, diarrhea, nausea and vomiting.  Musculoskeletal: Positive for gait problem.  Skin: Negative.   Neurological: Positive for weakness.  Psychiatric/Behavioral: Negative.     Objective:  Physical Exam Constitutional:      Appearance: He is well-developed.     Comments: thin  HENT:     Head: Normocephalic and atraumatic.  Cardiovascular:     Rate and Rhythm: Normal rate. Rhythm irregular.  Pulmonary:     Effort: Pulmonary effort is normal. No respiratory distress.     Breath sounds: Normal breath sounds. No wheezing or rales.  Abdominal:     General: Bowel sounds are normal. There is no distension.     Palpations: Abdomen is soft.     Tenderness: There is no abdominal tenderness. There is no rebound.  Musculoskeletal:     Cervical back: Normal range of motion.  Skin:    General: Skin is warm and dry.    Neurological:     Mental Status: He is alert and oriented to person, place, and time.     Cranial Nerves: Cranial nerve deficit present.     Motor: Weakness present.     Coordination: Coordination abnormal.     Comments: Wheelchair for ambulation     Vitals:   08/16/19 0956  BP: 120/70  Pulse: 80  Temp: 98.6 F (37 C)  SpO2: 99%  Weight: 135 lb 3.2 oz (61.3 kg)  Height: 6' (1.829 m)    This visit occurred during the SARS-CoV-2 public health emergency.  Safety protocols were in place, including screening questions prior to the visit, additional usage of staff PPE, and extensive cleaning of exam room while observing appropriate contact time as indicated for disinfecting solutions.   Assessment & Plan:

## 2019-08-16 NOTE — Assessment & Plan Note (Signed)
Improving.

## 2019-08-16 NOTE — Assessment & Plan Note (Signed)
BP improved with dose reduction of coreg to 6.25 mg BID and continued lasix and imdur and losartan. He will notify us if symptoms or BP changes. No labs indicated today.

## 2019-08-16 NOTE — Assessment & Plan Note (Signed)
Stable, still in A fib.

## 2019-08-17 DIAGNOSIS — I255 Ischemic cardiomyopathy: Secondary | ICD-10-CM | POA: Diagnosis not present

## 2019-08-17 DIAGNOSIS — I5043 Acute on chronic combined systolic (congestive) and diastolic (congestive) heart failure: Secondary | ICD-10-CM | POA: Diagnosis not present

## 2019-08-17 DIAGNOSIS — Z9181 History of falling: Secondary | ICD-10-CM | POA: Diagnosis not present

## 2019-08-17 DIAGNOSIS — H548 Legal blindness, as defined in USA: Secondary | ICD-10-CM | POA: Diagnosis not present

## 2019-08-17 DIAGNOSIS — I69322 Dysarthria following cerebral infarction: Secondary | ICD-10-CM | POA: Diagnosis not present

## 2019-08-17 DIAGNOSIS — Z87440 Personal history of urinary (tract) infections: Secondary | ICD-10-CM | POA: Diagnosis not present

## 2019-08-17 DIAGNOSIS — I110 Hypertensive heart disease with heart failure: Secondary | ICD-10-CM | POA: Diagnosis not present

## 2019-08-17 DIAGNOSIS — R1312 Dysphagia, oropharyngeal phase: Secondary | ICD-10-CM | POA: Diagnosis not present

## 2019-08-17 DIAGNOSIS — Z7902 Long term (current) use of antithrombotics/antiplatelets: Secondary | ICD-10-CM | POA: Diagnosis not present

## 2019-08-17 DIAGNOSIS — Z85038 Personal history of other malignant neoplasm of large intestine: Secondary | ICD-10-CM | POA: Diagnosis not present

## 2019-08-17 DIAGNOSIS — Z7901 Long term (current) use of anticoagulants: Secondary | ICD-10-CM | POA: Diagnosis not present

## 2019-08-17 DIAGNOSIS — I480 Paroxysmal atrial fibrillation: Secondary | ICD-10-CM | POA: Diagnosis not present

## 2019-08-17 DIAGNOSIS — I69351 Hemiplegia and hemiparesis following cerebral infarction affecting right dominant side: Secondary | ICD-10-CM | POA: Diagnosis not present

## 2019-08-17 DIAGNOSIS — H3530 Unspecified macular degeneration: Secondary | ICD-10-CM | POA: Diagnosis not present

## 2019-08-17 DIAGNOSIS — I25119 Atherosclerotic heart disease of native coronary artery with unspecified angina pectoris: Secondary | ICD-10-CM | POA: Diagnosis not present

## 2019-08-17 DIAGNOSIS — E876 Hypokalemia: Secondary | ICD-10-CM | POA: Diagnosis not present

## 2019-08-17 DIAGNOSIS — I69391 Dysphagia following cerebral infarction: Secondary | ICD-10-CM | POA: Diagnosis not present

## 2019-08-17 DIAGNOSIS — I69320 Aphasia following cerebral infarction: Secondary | ICD-10-CM | POA: Diagnosis not present

## 2019-08-18 DIAGNOSIS — Z7901 Long term (current) use of anticoagulants: Secondary | ICD-10-CM | POA: Diagnosis not present

## 2019-08-18 DIAGNOSIS — I69391 Dysphagia following cerebral infarction: Secondary | ICD-10-CM | POA: Diagnosis not present

## 2019-08-18 DIAGNOSIS — Z9181 History of falling: Secondary | ICD-10-CM | POA: Diagnosis not present

## 2019-08-18 DIAGNOSIS — I69322 Dysarthria following cerebral infarction: Secondary | ICD-10-CM | POA: Diagnosis not present

## 2019-08-18 DIAGNOSIS — H3530 Unspecified macular degeneration: Secondary | ICD-10-CM | POA: Diagnosis not present

## 2019-08-18 DIAGNOSIS — Z7902 Long term (current) use of antithrombotics/antiplatelets: Secondary | ICD-10-CM | POA: Diagnosis not present

## 2019-08-18 DIAGNOSIS — R1312 Dysphagia, oropharyngeal phase: Secondary | ICD-10-CM | POA: Diagnosis not present

## 2019-08-18 DIAGNOSIS — I5043 Acute on chronic combined systolic (congestive) and diastolic (congestive) heart failure: Secondary | ICD-10-CM | POA: Diagnosis not present

## 2019-08-18 DIAGNOSIS — I480 Paroxysmal atrial fibrillation: Secondary | ICD-10-CM | POA: Diagnosis not present

## 2019-08-18 DIAGNOSIS — E876 Hypokalemia: Secondary | ICD-10-CM | POA: Diagnosis not present

## 2019-08-18 DIAGNOSIS — Z85038 Personal history of other malignant neoplasm of large intestine: Secondary | ICD-10-CM | POA: Diagnosis not present

## 2019-08-18 DIAGNOSIS — I69351 Hemiplegia and hemiparesis following cerebral infarction affecting right dominant side: Secondary | ICD-10-CM | POA: Diagnosis not present

## 2019-08-18 DIAGNOSIS — I69320 Aphasia following cerebral infarction: Secondary | ICD-10-CM | POA: Diagnosis not present

## 2019-08-18 DIAGNOSIS — I110 Hypertensive heart disease with heart failure: Secondary | ICD-10-CM | POA: Diagnosis not present

## 2019-08-18 DIAGNOSIS — I255 Ischemic cardiomyopathy: Secondary | ICD-10-CM | POA: Diagnosis not present

## 2019-08-18 DIAGNOSIS — I25119 Atherosclerotic heart disease of native coronary artery with unspecified angina pectoris: Secondary | ICD-10-CM | POA: Diagnosis not present

## 2019-08-18 DIAGNOSIS — Z87440 Personal history of urinary (tract) infections: Secondary | ICD-10-CM | POA: Diagnosis not present

## 2019-08-18 DIAGNOSIS — H548 Legal blindness, as defined in USA: Secondary | ICD-10-CM | POA: Diagnosis not present

## 2019-08-21 ENCOUNTER — Telehealth: Payer: Self-pay | Admitting: Internal Medicine

## 2019-08-21 ENCOUNTER — Ambulatory Visit: Payer: Medicare Other | Admitting: Cardiology

## 2019-08-21 DIAGNOSIS — I255 Ischemic cardiomyopathy: Secondary | ICD-10-CM | POA: Diagnosis not present

## 2019-08-21 DIAGNOSIS — H548 Legal blindness, as defined in USA: Secondary | ICD-10-CM | POA: Diagnosis not present

## 2019-08-21 DIAGNOSIS — I5043 Acute on chronic combined systolic (congestive) and diastolic (congestive) heart failure: Secondary | ICD-10-CM | POA: Diagnosis not present

## 2019-08-21 DIAGNOSIS — I480 Paroxysmal atrial fibrillation: Secondary | ICD-10-CM | POA: Diagnosis not present

## 2019-08-21 DIAGNOSIS — I69351 Hemiplegia and hemiparesis following cerebral infarction affecting right dominant side: Secondary | ICD-10-CM | POA: Diagnosis not present

## 2019-08-21 DIAGNOSIS — Z9181 History of falling: Secondary | ICD-10-CM | POA: Diagnosis not present

## 2019-08-21 DIAGNOSIS — I69322 Dysarthria following cerebral infarction: Secondary | ICD-10-CM | POA: Diagnosis not present

## 2019-08-21 DIAGNOSIS — H3530 Unspecified macular degeneration: Secondary | ICD-10-CM | POA: Diagnosis not present

## 2019-08-21 DIAGNOSIS — I69391 Dysphagia following cerebral infarction: Secondary | ICD-10-CM | POA: Diagnosis not present

## 2019-08-21 DIAGNOSIS — E876 Hypokalemia: Secondary | ICD-10-CM | POA: Diagnosis not present

## 2019-08-21 DIAGNOSIS — Z87440 Personal history of urinary (tract) infections: Secondary | ICD-10-CM | POA: Diagnosis not present

## 2019-08-21 DIAGNOSIS — I25119 Atherosclerotic heart disease of native coronary artery with unspecified angina pectoris: Secondary | ICD-10-CM | POA: Diagnosis not present

## 2019-08-21 DIAGNOSIS — I69320 Aphasia following cerebral infarction: Secondary | ICD-10-CM | POA: Diagnosis not present

## 2019-08-21 DIAGNOSIS — R1312 Dysphagia, oropharyngeal phase: Secondary | ICD-10-CM | POA: Diagnosis not present

## 2019-08-21 DIAGNOSIS — Z85038 Personal history of other malignant neoplasm of large intestine: Secondary | ICD-10-CM | POA: Diagnosis not present

## 2019-08-21 DIAGNOSIS — I110 Hypertensive heart disease with heart failure: Secondary | ICD-10-CM | POA: Diagnosis not present

## 2019-08-21 DIAGNOSIS — Z7901 Long term (current) use of anticoagulants: Secondary | ICD-10-CM | POA: Diagnosis not present

## 2019-08-21 DIAGNOSIS — Z7902 Long term (current) use of antithrombotics/antiplatelets: Secondary | ICD-10-CM | POA: Diagnosis not present

## 2019-08-21 NOTE — Telephone Encounter (Signed)
   Patient c/o Palpitations:  High priority if patient c/o lightheadedness, shortness of breath, or chest pain  1) How long have you had palpitations/irregular HR/ Afib? Are you having the symptoms now? Afib last night  2) Are you currently experiencing lightheadedness, SOB or CP? No  3) Do you have a history of afib (atrial fibrillation) or irregular heart rhythm? Yes  4) Have you checked your BP or HR? (document readings if available): Don't have it  5) Are you experiencing any other symptoms? No  Pt said when he home from hospital to call right away Dr. Lequita Halt right away when he felt Afib, he got Afib last night, he denied any other symptoms.

## 2019-08-21 NOTE — Telephone Encounter (Signed)
Spoke with pt who states he is back in Afib as of 08/18/2019.  Pt reports no new symptoms, CP or SOB.  Pt is taking medications as prescribed.  Appointment scheduled with Afib clinic for 08/22/2019 at 11am.  Parking code provided.  Pt advised if he develops CP,SOB, dizziness or fainting to go directly to ED for further evaluation.  Pt verbalizes understanding and agrees with current plan.

## 2019-08-22 ENCOUNTER — Ambulatory Visit (HOSPITAL_COMMUNITY)
Admission: RE | Admit: 2019-08-22 | Discharge: 2019-08-22 | Disposition: A | Discharge status: Home / Self Care | Payer: Medicare Other | Source: Ambulatory Visit | Attending: Nurse Practitioner | Admitting: Nurse Practitioner

## 2019-08-22 ENCOUNTER — Encounter (HOSPITAL_COMMUNITY): Payer: Self-pay | Admitting: Nurse Practitioner

## 2019-08-22 ENCOUNTER — Other Ambulatory Visit: Payer: Self-pay

## 2019-08-22 VITALS — BP 112/58 | HR 74 | Ht 72.0 in | Wt 133.4 lb

## 2019-08-22 DIAGNOSIS — Z7902 Long term (current) use of antithrombotics/antiplatelets: Secondary | ICD-10-CM | POA: Diagnosis not present

## 2019-08-22 DIAGNOSIS — I480 Paroxysmal atrial fibrillation: Secondary | ICD-10-CM | POA: Insufficient documentation

## 2019-08-22 DIAGNOSIS — Z951 Presence of aortocoronary bypass graft: Secondary | ICD-10-CM | POA: Diagnosis not present

## 2019-08-22 DIAGNOSIS — I255 Ischemic cardiomyopathy: Secondary | ICD-10-CM | POA: Insufficient documentation

## 2019-08-22 DIAGNOSIS — I2510 Atherosclerotic heart disease of native coronary artery without angina pectoris: Secondary | ICD-10-CM | POA: Insufficient documentation

## 2019-08-22 DIAGNOSIS — I110 Hypertensive heart disease with heart failure: Secondary | ICD-10-CM | POA: Diagnosis not present

## 2019-08-22 DIAGNOSIS — Z79899 Other long term (current) drug therapy: Secondary | ICD-10-CM | POA: Diagnosis not present

## 2019-08-22 DIAGNOSIS — Z7901 Long term (current) use of anticoagulants: Secondary | ICD-10-CM | POA: Insufficient documentation

## 2019-08-22 DIAGNOSIS — I454 Nonspecific intraventricular block: Secondary | ICD-10-CM | POA: Insufficient documentation

## 2019-08-22 DIAGNOSIS — I5022 Chronic systolic (congestive) heart failure: Secondary | ICD-10-CM | POA: Insufficient documentation

## 2019-08-22 DIAGNOSIS — I252 Old myocardial infarction: Secondary | ICD-10-CM | POA: Insufficient documentation

## 2019-08-22 DIAGNOSIS — D6869 Other thrombophilia: Secondary | ICD-10-CM

## 2019-08-22 DIAGNOSIS — I4819 Other persistent atrial fibrillation: Secondary | ICD-10-CM

## 2019-08-22 DIAGNOSIS — Z95810 Presence of automatic (implantable) cardiac defibrillator: Secondary | ICD-10-CM | POA: Diagnosis not present

## 2019-08-22 DIAGNOSIS — K219 Gastro-esophageal reflux disease without esophagitis: Secondary | ICD-10-CM | POA: Insufficient documentation

## 2019-08-22 NOTE — Progress Notes (Signed)
Primary Care Physician: [name], MD Referring Physician: Dr. Jani Files Mark Clayton is a 79 y.o. male with a h/o ICD, CAD,EF 20% paroxsymal afib was on Tikosyn until March 2021 when it was stopped for persistent afib that occurred  around the time of a CVA. He was just seen by Dr. Caryl Comes via tele visit 5/13 at which time he said that he was tolerating afib, it was rate controlled  and pt would have rate controlled afib as his strategy going forward.   The pt asked to be seen today for a spell he had Friday pm when he went to stand up and he felt "everything just fell thru the bottom." He recovered within a few hours. He said he may have let his nerves get  the best of him. He wanted to be checked as Dr. Caryl Comes had told him if he had a spell to call the office. Device interrogated today that shows persistent afib, rate controlled since March. No unusual heart rhythms detected Friday pm by interrogation  when pt had symptoms.   He is on xarelto 20 mg daily for a CHA2DS2VASc score of at least 5.   Today, he denies symptoms of palpitations, chest pain, shortness of breath, orthopnea, PND, lower extremity edema, dizziness, presyncope, syncope, or neurologic sequela. The patient is tolerating medications without difficulties and is otherwise without complaint today.   Past Medical History:  Diagnosis Date  . AICD (automatic cardioverter/defibrillator) present 01/17/2003   Medtronic Maximo 7232CX ICD, serial [id number]  . Anemia 02-06-11   takes oral iron  . Arthritis    hands, knees  . CAD (coronary artery disease) 2003   a. h/o MI and CABG in 2003. b. s/p DES to SVG-RPDA-RPLB in 08/2014.  Marland Kitchen Cancer of sigmoid colon ([place]) 2012   a. s/p colon surgery.  . Carotid bruit   . Chronic systolic CHF (congestive heart failure) (HCC)    a. EF 20% in 2014; b. 08/2017 Echo: EF 20-25%, diff HK, Gr3 DD. Triv AI. Mod MR. Sev dil LA. Mildly dil RV w/ mildly reduced RV fxn. Mildly dil RA. Mod TR.  PASP 50mHg.  Marland Kitchen Cough   . GERD (gastroesophageal reflux disease) 02-06-11  . HTN (hypertension)   . Hyperlipidemia   . Ischemic cardiomyopathy    a. EF 20% in 2014. (Master study EF >20%); b. 08/2017 Echo: EF 20-25%, diff HK. Gr3 DD.  . LV (left ventricular) mural thrombus    a. 12/2012 Echo: EF 20% with mural thrombus No evidence of thrombus on 08/2017 echo.  . Macular degeneration   . Myocardial infarct ([place])    2003  . PAF (paroxysmal atrial fibrillation) (HCC)    a. CHA2DS2VASc = 5-->Xarelto/Tikosyn.   Past Surgical History:  Procedure Laterality Date  . CARDIAC CATHETERIZATION N/A 09/20/2014   Procedure: Left Heart Cath and Coronary Angiography;  Surgeon: Jettie Booze, MD; LAD 95%, D1 100%, CFX liner percent, OM 200%, OM 390%, RCA 90%, LIMA-LAD okay, SVG-OM 2-OM 3 minimal disease, SVG-RPDA-RPLB 100% between the RPDA and RPL     . CARDIAC CATHETERIZATION N/A 09/20/2014   Procedure: Coronary Stent Intervention;  Surgeon: Jettie Booze, MD; Synergy DES 4 x 24 mm reducing the stenosis to 5%   . CARDIAC DEFIBRILLATOR PLACEMENT  01/17/03   6949 lead. Medtronic. remote-no; with later revision  . CARDIOVERSION N/A 05/22/2016   Procedure: CARDIOVERSION;  Surgeon: Lelon Perla, MD;  Location: [facility] ENDOSCOPY;  Service: Cardiovascular;  Laterality: N/A;  .  CARDIOVERSION N/A 08/10/2017   Procedure: CARDIOVERSION;  Surgeon: Pixie Casino, MD;  Location: [facility] ENDOSCOPY;  Service: Cardiovascular;  Laterality: N/A;  . CATARACT EXTRACTION W/ INTRAOCULAR LENS  IMPLANT, BILATERAL Bilateral June/-July 2009   Dr. Katy Fitch  . CATARACT EXTRACTION W/PHACO Bilateral 2010   Dr. Katy Fitch  . COLON RESECTION  02/09/2011   Procedure: LAPAROSCOPIC SIGMOID COLON RESECTION;  Surgeon: Pedro Earls, MD;  Location: [facility] ORS;  Service: General;  Laterality: N/A;  Laparoscopic Assisted Sigmoid Colectomy  . COLON SURGERY    . COLONOSCOPY  08/31/2011   Procedure: COLONOSCOPY;  Surgeon: Jerene Bears, MD;  Location:  [facility] ENDOSCOPY;  Service: Gastroenterology;  Laterality: N/A;  . COLONOSCOPY N/A 09/05/2012   Procedure: COLONOSCOPY;  Surgeon: Jerene Bears, MD;  Location: [facility] ENDOSCOPY;  Service: Gastroenterology;  Laterality: N/A;  . COLONOSCOPY N/A 04/18/2013   Procedure: COLONOSCOPY;  Surgeon: Jerene Bears, MD;  Location: [facility] ENDOSCOPY;  Service: Gastroenterology;  Laterality: N/A;  . COLONOSCOPY N/A 04/09/2014   Procedure: COLONOSCOPY;  Surgeon: Jerene Bears, MD;  Location: [facility] ENDOSCOPY;  Service: Gastroenterology;  Laterality: N/A;  . COLONOSCOPY WITH PROPOFOL N/A 05/03/2017   Procedure: COLONOSCOPY WITH PROPOFOL;  Surgeon: Jerene Bears, MD;  Location: [facility] ENDOSCOPY;  Service: Gastroenterology;  Laterality: N/A;  . CORONARY ANGIOPLASTY    . CORONARY ARTERY BYPASS GRAFT  01/2002   LIMA-LAD, SVG-OM 2-OM 3, SVG-RPDA-RPLB  . CORONARY STENT INTERVENTION N/A 11/22/2018   Procedure: CORONARY STENT INTERVENTION;  Surgeon: Nelva Bush, MD;  Location: [place] CV LAB;  Service: Cardiovascular;  Laterality: N/A;  SVG - RCA  . ICD GENERATOR CHANGE  2010   Medtronic Virtuoso II VR ICD  . ICD GENERATOR CHANGEOUT N/A 12/07/2018   Procedure: ICD GENERATOR CHANGEOUT;  Surgeon: Deboraha Sprang, MD;  Location: [place] CV LAB;  Service: Cardiovascular;  Laterality: N/A;  . INGUINAL HERNIA REPAIR Right 2000's X 2  . LAPAROSCOPIC RIGHT HEMI COLECTOMY N/A 11/04/2012   Procedure: LAPAROSCOPIC RIGHT HEMI COLECTOMY;  Surgeon: Pedro Earls, MD;  Location: [facility] ORS;  Service: General;  Laterality: N/A;  . LEFT HEART CATH AND CORS/GRAFTS ANGIOGRAPHY Left 11/22/2018   Procedure: LEFT HEART CATH AND CORS/GRAFTS ANGIOGRAPHY;  Surgeon: Nelva Bush, MD;  Location: [place] CV LAB;  Service: Cardiovascular;  Laterality: Left;  . TONSILLECTOMY  ~ 1950    Current Outpatient Medications  Medication Sig Dispense Refill  . acetaminophen (TYLENOL) 325 MG tablet Take 1-2 tablets (325-650 mg total) by mouth every 4 (four) hours  as needed for mild pain.    Marland Kitchen atorvastatin (LIPITOR) 80 MG tablet Take 1 tablet (80 mg total) by mouth daily. 90 tablet 3  . carvedilol (COREG) 6.25 MG tablet Take 1 tablet (6.25 mg total) by mouth 2 (two) times daily with a meal. 180 tablet 3  . clopidogrel (PLAVIX) 75 MG tablet Take 1 tablet (75 mg total) by mouth daily.    Marland Kitchen ezetimibe (ZETIA) 10 MG tablet Take 1 tablet (10 mg total) by mouth daily. 90 tablet 3  . furosemide (LASIX) 40 MG tablet TAKE 1 TABLET BY MOUTH  DAILY 90 tablet 3  . isosorbide mononitrate (IMDUR) 30 MG 24 hr tablet TAKE 1 TABLET BY MOUTH  DAILY 90 tablet 3  . losartan (COZAAR) 50 MG tablet TAKE 1 TABLET BY MOUTH  DAILY 90 tablet 3  . meclizine (ANTIVERT) 12.5 MG tablet Take 1 tablet (12.5 mg total) by mouth 2 (two) times daily as needed for dizziness. [place]  tablet 0  . nitroGLYCERIN (NITROSTAT) 0.4 MG SL tablet     . pantoprazole (PROTONIX) 40 MG tablet TAKE 1 TABLET BY MOUTH  DAILY 90 tablet 3  . polyethylene glycol (MIRALAX / GLYCOLAX) 17 g packet Take 17 g by mouth daily. 14 each 0  . potassium chloride (KLOR-CON) 10 MEQ tablet Take 1 tablet (10 mEq total) by mouth daily. 90 tablet 3  . rivaroxaban (XARELTO) 20 MG TABS tablet Take 1 tablet (20 mg total) by mouth daily with supper. 30 tablet 3  . vitamin B-12 (CYANOCOBALAMIN) 100 MCG tablet Take 100 mcg by mouth daily.      No current facility-administered medications for this encounter.    Allergies  Allergen Reactions  . Latex Rash  . Tape Rash and Other (See Comments)    USE PAPER    Social History   Socioeconomic History  . Marital status: Widowed    Spouse name: Not on file  . Number of children: 3  . Years of education: Not on file  . Highest education level: Not on file  Occupational History  . Occupation: self Physiological scientist  Tobacco Use  . Smoking status: Never Smoker  . Smokeless tobacco: Never Used  Substance and Sexual Activity  . Alcohol use: Yes    Alcohol/week: 2.0 standard drinks     Types: 2 Cans of beer per week  . Drug use: No  . Sexual activity: Not Currently  Other Topics Concern  . Not on file  Social History Narrative   Full time. Married.    Social Determinants of Health   Financial Resource Strain: Low Risk   . Difficulty of Paying Living Expenses: Not very hard  Food Insecurity: No Food Insecurity  . Worried About [name] fundraiser in the Last Year: Never true  . Ran Out of Food in the Last Year: Never true  Transportation Needs: No Transportation Needs  . Lack of Transportation (Medical): No  . Lack of Transportation (Non-Medical): No  Physical Activity: Sufficiently Active  . Days of Exercise per Week: 6 days  . Minutes of Exercise per Session: 40 min  Stress:   . Feeling of Stress :   Social Connections: Slightly Isolated  . Frequency of Communication with Friends and Family: More than three times a week  . Frequency of Social Gatherings with Friends and Family: More than three times a week  . Attends Religious Services: More than 4 times per year  . Active Member of Clubs or Organizations: Yes  . Attends Archivist Meetings: More than 4 times per year  . Marital Status: Widowed  Intimate Partner Violence:   . Fear of Current or Ex-Partner:   . Emotionally Abused:   Marland Kitchen Physically Abused:   . Sexually Abused:     Family History  Problem Relation Age of Onset  . Hypertension Father   . Hyperlipidemia Father   . Heart disease Father   . Prostate cancer Father   . Alzheimer's disease Mother   . Hypertension Sister   . Hyperlipidemia Sister   . Colon cancer Neg Hx   . Esophageal cancer Neg Hx   . Rectal cancer Neg Hx   . Stomach cancer Neg Hx     ROS- All systems are reviewed and negative except as per the HPI above  Physical Exam: Vitals:   08/22/19 1058  BP: (!) 112/58  Pulse: 74  Weight: 60.5 kg  Height: 6' (1.829 m)   Wt Readings from  Last 3 Encounters:  08/22/19 60.5 kg  08/16/19 61.3 kg  07/27/19 63.5 kg     Labs: Lab Results  Component Value Date   NA 139 07/27/2019   K 4.9 07/27/2019   CL 101 07/27/2019   CO2 32 07/27/2019   GLUCOSE 113 (H) 07/27/2019   BUN 9 07/27/2019   CREATININE 1.17 07/27/2019   CALCIUM 10.0 07/27/2019   PHOS 3.0 06/08/2019   MG 2.2 06/14/2019   Lab Results  Component Value Date   INR 1.1 06/06/2019   Lab Results  Component Value Date   CHOL 121 06/07/2019   HDL 39 (L) 06/07/2019   LDLCALC 60 06/07/2019   TRIG 112 06/07/2019     GEN- The patient is well appearing, alert and oriented x 3 today.   Head- normocephalic, atraumatic Eyes-  Sclera clear, conjunctiva pink Ears- hearing intact Oropharynx- clear Neck- supple, no JVP Lymph- no cervical lymphadenopathy Lungs- Clear to ausculation bilaterally, normal work of breathing Heart- irregular rate and rhythm, no murmurs, rubs or gallops, PMI not laterally displaced GI- soft, NT, ND, + BS Extremities- no clubbing, cyanosis, or edema MS- no significant deformity or atrophy Skin- no rash or lesion Psych- euthymic mood, full affect Neuro- strength and sensation are intact  EKG- afib at 74 bpm, interrogation shows persistent afib since March 2021, rate controlled, no unusual arrhythmia noted 5/21.     Assessment and Plan: 1. Afib Now off tikosyn since March 2021 at time of stroke and persistent afib Pt will be rate controlled afib going forward per Dr. Olin Pia note  this month. Had  a "spell" Friday pm, no unusual  arrhythmia by interrogation Pt reassured Continue carvedilol 6.25 mg bid     2. CHF Weight stable Continue diuretic Avoid salt Daily weights   3. CAD  no anginal symptoms   4. CHA2DS2VASc of 5 Continue  xarelto   F/u with Dr. Caryl Comes 11/13/19    Geroge Baseman. Nathaniel Wakeley, [facility] [address] [place], [place] [address] [phone]

## 2019-08-23 ENCOUNTER — Ambulatory Visit: Payer: Self-pay | Admitting: Pharmacist

## 2019-08-23 ENCOUNTER — Other Ambulatory Visit: Payer: Self-pay | Admitting: Internal Medicine

## 2019-08-23 ENCOUNTER — Inpatient Hospital Stay: Payer: Self-pay | Admitting: Adult Health

## 2019-08-23 DIAGNOSIS — Z9181 History of falling: Secondary | ICD-10-CM | POA: Diagnosis not present

## 2019-08-23 DIAGNOSIS — R059 Cough, unspecified: Secondary | ICD-10-CM

## 2019-08-23 DIAGNOSIS — H3530 Unspecified macular degeneration: Secondary | ICD-10-CM | POA: Diagnosis not present

## 2019-08-23 DIAGNOSIS — Z7901 Long term (current) use of anticoagulants: Secondary | ICD-10-CM | POA: Diagnosis not present

## 2019-08-23 DIAGNOSIS — I5043 Acute on chronic combined systolic (congestive) and diastolic (congestive) heart failure: Secondary | ICD-10-CM | POA: Diagnosis not present

## 2019-08-23 DIAGNOSIS — I69351 Hemiplegia and hemiparesis following cerebral infarction affecting right dominant side: Secondary | ICD-10-CM | POA: Diagnosis not present

## 2019-08-23 DIAGNOSIS — I255 Ischemic cardiomyopathy: Secondary | ICD-10-CM | POA: Diagnosis not present

## 2019-08-23 DIAGNOSIS — Z87440 Personal history of urinary (tract) infections: Secondary | ICD-10-CM | POA: Diagnosis not present

## 2019-08-23 DIAGNOSIS — I69391 Dysphagia following cerebral infarction: Secondary | ICD-10-CM | POA: Diagnosis not present

## 2019-08-23 DIAGNOSIS — H548 Legal blindness, as defined in USA: Secondary | ICD-10-CM | POA: Diagnosis not present

## 2019-08-23 DIAGNOSIS — Z7902 Long term (current) use of antithrombotics/antiplatelets: Secondary | ICD-10-CM | POA: Diagnosis not present

## 2019-08-23 DIAGNOSIS — Z85038 Personal history of other malignant neoplasm of large intestine: Secondary | ICD-10-CM | POA: Diagnosis not present

## 2019-08-23 DIAGNOSIS — I480 Paroxysmal atrial fibrillation: Secondary | ICD-10-CM | POA: Diagnosis not present

## 2019-08-23 DIAGNOSIS — R1312 Dysphagia, oropharyngeal phase: Secondary | ICD-10-CM | POA: Diagnosis not present

## 2019-08-23 DIAGNOSIS — E876 Hypokalemia: Secondary | ICD-10-CM | POA: Diagnosis not present

## 2019-08-23 DIAGNOSIS — I69322 Dysarthria following cerebral infarction: Secondary | ICD-10-CM | POA: Diagnosis not present

## 2019-08-23 DIAGNOSIS — I110 Hypertensive heart disease with heart failure: Secondary | ICD-10-CM | POA: Diagnosis not present

## 2019-08-23 DIAGNOSIS — I69320 Aphasia following cerebral infarction: Secondary | ICD-10-CM | POA: Diagnosis not present

## 2019-08-23 DIAGNOSIS — I25119 Atherosclerotic heart disease of native coronary artery with unspecified angina pectoris: Secondary | ICD-10-CM | POA: Diagnosis not present

## 2019-08-23 NOTE — Progress Notes (Signed)
  Chronic Care Management   Outreach Note  08/23/2019 Name: Mark Clayton MRN: [id number] DOB: 12-16-1940  Referred by: [name], MD Reason for referral : Chronic Care Management (Advice only)  Received call from patient's daughter [name], she reports pt is having sinus symptoms and requests a recommendation. Per Melissa pt is coughing up phlegm, denies constant cough or difficulty breathing, denies sinus pressure or congestion, denies rhinorrhea, denies headache.  Based on symptoms I recommended regular Mucinex to help with phlegm and Claritin for post-nasal drip. Caregiver voiced understanding and will obtain OTC medications. Denies further issues or questions.    Charlton Haws, [facility]

## 2019-08-24 DIAGNOSIS — I480 Paroxysmal atrial fibrillation: Secondary | ICD-10-CM | POA: Diagnosis not present

## 2019-08-24 DIAGNOSIS — H548 Legal blindness, as defined in USA: Secondary | ICD-10-CM | POA: Diagnosis not present

## 2019-08-24 DIAGNOSIS — Z7902 Long term (current) use of antithrombotics/antiplatelets: Secondary | ICD-10-CM | POA: Diagnosis not present

## 2019-08-24 DIAGNOSIS — I69351 Hemiplegia and hemiparesis following cerebral infarction affecting right dominant side: Secondary | ICD-10-CM | POA: Diagnosis not present

## 2019-08-24 DIAGNOSIS — Z85038 Personal history of other malignant neoplasm of large intestine: Secondary | ICD-10-CM | POA: Diagnosis not present

## 2019-08-24 DIAGNOSIS — I255 Ischemic cardiomyopathy: Secondary | ICD-10-CM | POA: Diagnosis not present

## 2019-08-24 DIAGNOSIS — I110 Hypertensive heart disease with heart failure: Secondary | ICD-10-CM | POA: Diagnosis not present

## 2019-08-24 DIAGNOSIS — H3530 Unspecified macular degeneration: Secondary | ICD-10-CM | POA: Diagnosis not present

## 2019-08-24 DIAGNOSIS — Z9181 History of falling: Secondary | ICD-10-CM | POA: Diagnosis not present

## 2019-08-24 DIAGNOSIS — I69391 Dysphagia following cerebral infarction: Secondary | ICD-10-CM | POA: Diagnosis not present

## 2019-08-24 DIAGNOSIS — E876 Hypokalemia: Secondary | ICD-10-CM | POA: Diagnosis not present

## 2019-08-24 DIAGNOSIS — Z87440 Personal history of urinary (tract) infections: Secondary | ICD-10-CM | POA: Diagnosis not present

## 2019-08-24 DIAGNOSIS — R1312 Dysphagia, oropharyngeal phase: Secondary | ICD-10-CM | POA: Diagnosis not present

## 2019-08-24 DIAGNOSIS — I5043 Acute on chronic combined systolic (congestive) and diastolic (congestive) heart failure: Secondary | ICD-10-CM | POA: Diagnosis not present

## 2019-08-24 DIAGNOSIS — I69322 Dysarthria following cerebral infarction: Secondary | ICD-10-CM | POA: Diagnosis not present

## 2019-08-24 DIAGNOSIS — Z7901 Long term (current) use of anticoagulants: Secondary | ICD-10-CM | POA: Diagnosis not present

## 2019-08-24 DIAGNOSIS — I69320 Aphasia following cerebral infarction: Secondary | ICD-10-CM | POA: Diagnosis not present

## 2019-08-24 DIAGNOSIS — I25119 Atherosclerotic heart disease of native coronary artery with unspecified angina pectoris: Secondary | ICD-10-CM | POA: Diagnosis not present

## 2019-08-25 DIAGNOSIS — I69391 Dysphagia following cerebral infarction: Secondary | ICD-10-CM | POA: Diagnosis not present

## 2019-08-25 DIAGNOSIS — Z87440 Personal history of urinary (tract) infections: Secondary | ICD-10-CM | POA: Diagnosis not present

## 2019-08-25 DIAGNOSIS — I69351 Hemiplegia and hemiparesis following cerebral infarction affecting right dominant side: Secondary | ICD-10-CM | POA: Diagnosis not present

## 2019-08-25 DIAGNOSIS — H3530 Unspecified macular degeneration: Secondary | ICD-10-CM | POA: Diagnosis not present

## 2019-08-25 DIAGNOSIS — I480 Paroxysmal atrial fibrillation: Secondary | ICD-10-CM | POA: Diagnosis not present

## 2019-08-25 DIAGNOSIS — I5043 Acute on chronic combined systolic (congestive) and diastolic (congestive) heart failure: Secondary | ICD-10-CM | POA: Diagnosis not present

## 2019-08-25 DIAGNOSIS — I255 Ischemic cardiomyopathy: Secondary | ICD-10-CM | POA: Diagnosis not present

## 2019-08-25 DIAGNOSIS — E876 Hypokalemia: Secondary | ICD-10-CM | POA: Diagnosis not present

## 2019-08-25 DIAGNOSIS — I69322 Dysarthria following cerebral infarction: Secondary | ICD-10-CM | POA: Diagnosis not present

## 2019-08-25 DIAGNOSIS — I110 Hypertensive heart disease with heart failure: Secondary | ICD-10-CM | POA: Diagnosis not present

## 2019-08-25 DIAGNOSIS — Z7901 Long term (current) use of anticoagulants: Secondary | ICD-10-CM | POA: Diagnosis not present

## 2019-08-25 DIAGNOSIS — Z7902 Long term (current) use of antithrombotics/antiplatelets: Secondary | ICD-10-CM | POA: Diagnosis not present

## 2019-08-25 DIAGNOSIS — Z85038 Personal history of other malignant neoplasm of large intestine: Secondary | ICD-10-CM | POA: Diagnosis not present

## 2019-08-25 DIAGNOSIS — I25119 Atherosclerotic heart disease of native coronary artery with unspecified angina pectoris: Secondary | ICD-10-CM | POA: Diagnosis not present

## 2019-08-25 DIAGNOSIS — R1312 Dysphagia, oropharyngeal phase: Secondary | ICD-10-CM | POA: Diagnosis not present

## 2019-08-25 DIAGNOSIS — I69320 Aphasia following cerebral infarction: Secondary | ICD-10-CM | POA: Diagnosis not present

## 2019-08-25 DIAGNOSIS — H548 Legal blindness, as defined in USA: Secondary | ICD-10-CM | POA: Diagnosis not present

## 2019-08-25 DIAGNOSIS — Z9181 History of falling: Secondary | ICD-10-CM | POA: Diagnosis not present

## 2019-08-29 ENCOUNTER — Ambulatory Visit (INDEPENDENT_AMBULATORY_CARE_PROVIDER_SITE_OTHER): Payer: Medicare Other

## 2019-08-29 ENCOUNTER — Other Ambulatory Visit: Payer: Self-pay | Admitting: *Deleted

## 2019-08-29 DIAGNOSIS — Z87440 Personal history of urinary (tract) infections: Secondary | ICD-10-CM | POA: Diagnosis not present

## 2019-08-29 DIAGNOSIS — H3530 Unspecified macular degeneration: Secondary | ICD-10-CM | POA: Diagnosis not present

## 2019-08-29 DIAGNOSIS — I255 Ischemic cardiomyopathy: Secondary | ICD-10-CM | POA: Diagnosis not present

## 2019-08-29 DIAGNOSIS — I5022 Chronic systolic (congestive) heart failure: Secondary | ICD-10-CM | POA: Diagnosis not present

## 2019-08-29 DIAGNOSIS — I110 Hypertensive heart disease with heart failure: Secondary | ICD-10-CM | POA: Diagnosis not present

## 2019-08-29 DIAGNOSIS — R1312 Dysphagia, oropharyngeal phase: Secondary | ICD-10-CM | POA: Diagnosis not present

## 2019-08-29 DIAGNOSIS — Z7901 Long term (current) use of anticoagulants: Secondary | ICD-10-CM | POA: Diagnosis not present

## 2019-08-29 DIAGNOSIS — Z85038 Personal history of other malignant neoplasm of large intestine: Secondary | ICD-10-CM | POA: Diagnosis not present

## 2019-08-29 DIAGNOSIS — I69320 Aphasia following cerebral infarction: Secondary | ICD-10-CM | POA: Diagnosis not present

## 2019-08-29 DIAGNOSIS — E876 Hypokalemia: Secondary | ICD-10-CM | POA: Diagnosis not present

## 2019-08-29 DIAGNOSIS — Z9181 History of falling: Secondary | ICD-10-CM | POA: Diagnosis not present

## 2019-08-29 DIAGNOSIS — H548 Legal blindness, as defined in USA: Secondary | ICD-10-CM | POA: Diagnosis not present

## 2019-08-29 DIAGNOSIS — I69322 Dysarthria following cerebral infarction: Secondary | ICD-10-CM | POA: Diagnosis not present

## 2019-08-29 DIAGNOSIS — Z7902 Long term (current) use of antithrombotics/antiplatelets: Secondary | ICD-10-CM | POA: Diagnosis not present

## 2019-08-29 DIAGNOSIS — I69351 Hemiplegia and hemiparesis following cerebral infarction affecting right dominant side: Secondary | ICD-10-CM | POA: Diagnosis not present

## 2019-08-29 DIAGNOSIS — Z95810 Presence of automatic (implantable) cardiac defibrillator: Secondary | ICD-10-CM

## 2019-08-29 DIAGNOSIS — I25119 Atherosclerotic heart disease of native coronary artery with unspecified angina pectoris: Secondary | ICD-10-CM | POA: Diagnosis not present

## 2019-08-29 DIAGNOSIS — I480 Paroxysmal atrial fibrillation: Secondary | ICD-10-CM | POA: Diagnosis not present

## 2019-08-29 DIAGNOSIS — I5043 Acute on chronic combined systolic (congestive) and diastolic (congestive) heart failure: Secondary | ICD-10-CM | POA: Diagnosis not present

## 2019-08-29 DIAGNOSIS — I69391 Dysphagia following cerebral infarction: Secondary | ICD-10-CM | POA: Diagnosis not present

## 2019-08-29 MED ORDER — ATORVASTATIN CALCIUM 80 MG PO TABS: 80.0000 mg | ORAL_TABLET | Freq: Every day | ORAL | 0 refills | Status: DC

## 2019-08-29 NOTE — Telephone Encounter (Signed)
Requested Prescriptions   Signed Prescriptions Disp Refills  . atorvastatin (LIPITOR) 80 MG tablet 90 tablet 0    Sig: Take 1 tablet (80 mg total) by mouth daily.    Authorizing Provider: Theora Gianotti    Ordering User: Britt Bottom

## 2019-08-29 NOTE — Progress Notes (Signed)
EPIC Encounter for ICM Monitoring  Patient Name: Mark Clayton is a 79 y.o. male Date: 08/29/2019 Primary Care Physican: [name], MD Cardiologist:End Electrophysiologist: Caryl Comes 6/1/2021Weight: 143-144lbs  Time in AF 24.0 hr/day (100.0%)  (taking Xarelto)    Spoke with patient.  He denies fluid symptoms and has excellent urine output on furosemide.  He reports he is following low salt diet.           OptivolThoracic impedancesuggesting possible fluid accumulation for a few days.  He reports increasing fluid intake at the beginning of the week because he feels like he was not swallowing well.  Prescribed:  Furosemide 40 mg 1 tablet daily.   Potassium 10 mEq take 1 tablet daily  Labs: 07/27/2019 Creatinine 1.17, BUN 9, Potassium 4.9, Sodium 139 07/03/2019 Creatinine 1.14, BUN 8, Potassium 4.0, Sodium 131, GFR >60  06/26/2019 Creatinine 1.00, BUN 10, Potassium 4.2, Sodium 127, GFR >60  06/19/2019 Creatinine 1.18, BUN 16, Potassium 3.6, Sodium 138, GFR 59->60  06/15/2019 Creatinine 1.08, BUN 14, Potassium 3.8, Sodium 136, GFR >60  06/14/2019 Creatinine 1.06, BUN 17, Potassium 4.0, Sodium 138, GFR >60  06/13/2019 Creatinine 1.04, BUN 17, Potassium 3.2, Sodium 139, GFR >60  06/10/2019 Creatinine 1.07, BUN 25, Potassium 4.0, Sodium 139, GFR >60  06/09/2019 Creatinine 1.12, BUN 22, Potassium 3.6, Sodium 142, GFR >60  A complete set of results can be found in Results Review.  Recommendations:  Recommendation to limit salt intake to 2000 mg daily and fluid intake to 64 oz daily.  Encouraged to call if experiencing any fluid symptoms.   Follow-up plan: ICM clinic phone appointment on6/16/2021 to recheck fluid levels. 91 day device clinic remote transmission 10/25/2019.  Copy of ICM check sent to [facility].  3 month ICM trend: 08/29/2019    1 Year ICM trend:       Rosalene Billings, RN 08/29/2019 3:35 PM

## 2019-08-30 DIAGNOSIS — I255 Ischemic cardiomyopathy: Secondary | ICD-10-CM | POA: Diagnosis not present

## 2019-08-30 DIAGNOSIS — R1312 Dysphagia, oropharyngeal phase: Secondary | ICD-10-CM | POA: Diagnosis not present

## 2019-08-30 DIAGNOSIS — E876 Hypokalemia: Secondary | ICD-10-CM | POA: Diagnosis not present

## 2019-08-30 DIAGNOSIS — I69322 Dysarthria following cerebral infarction: Secondary | ICD-10-CM | POA: Diagnosis not present

## 2019-08-30 DIAGNOSIS — Z7902 Long term (current) use of antithrombotics/antiplatelets: Secondary | ICD-10-CM | POA: Diagnosis not present

## 2019-08-30 DIAGNOSIS — H548 Legal blindness, as defined in USA: Secondary | ICD-10-CM | POA: Diagnosis not present

## 2019-08-30 DIAGNOSIS — I69320 Aphasia following cerebral infarction: Secondary | ICD-10-CM | POA: Diagnosis not present

## 2019-08-30 DIAGNOSIS — H3530 Unspecified macular degeneration: Secondary | ICD-10-CM | POA: Diagnosis not present

## 2019-08-30 DIAGNOSIS — I480 Paroxysmal atrial fibrillation: Secondary | ICD-10-CM | POA: Diagnosis not present

## 2019-08-30 DIAGNOSIS — I25119 Atherosclerotic heart disease of native coronary artery with unspecified angina pectoris: Secondary | ICD-10-CM | POA: Diagnosis not present

## 2019-08-30 DIAGNOSIS — I69351 Hemiplegia and hemiparesis following cerebral infarction affecting right dominant side: Secondary | ICD-10-CM | POA: Diagnosis not present

## 2019-08-30 DIAGNOSIS — I69391 Dysphagia following cerebral infarction: Secondary | ICD-10-CM | POA: Diagnosis not present

## 2019-08-30 DIAGNOSIS — I5043 Acute on chronic combined systolic (congestive) and diastolic (congestive) heart failure: Secondary | ICD-10-CM | POA: Diagnosis not present

## 2019-08-30 DIAGNOSIS — Z7901 Long term (current) use of anticoagulants: Secondary | ICD-10-CM | POA: Diagnosis not present

## 2019-08-30 DIAGNOSIS — Z85038 Personal history of other malignant neoplasm of large intestine: Secondary | ICD-10-CM | POA: Diagnosis not present

## 2019-08-30 DIAGNOSIS — Z87440 Personal history of urinary (tract) infections: Secondary | ICD-10-CM | POA: Diagnosis not present

## 2019-08-30 DIAGNOSIS — Z9181 History of falling: Secondary | ICD-10-CM | POA: Diagnosis not present

## 2019-08-30 DIAGNOSIS — I110 Hypertensive heart disease with heart failure: Secondary | ICD-10-CM | POA: Diagnosis not present

## 2019-08-31 ENCOUNTER — Ambulatory Visit: Payer: Medicare Other | Admitting: Physical Medicine & Rehabilitation

## 2019-08-31 DIAGNOSIS — Z85038 Personal history of other malignant neoplasm of large intestine: Secondary | ICD-10-CM | POA: Diagnosis not present

## 2019-08-31 DIAGNOSIS — I110 Hypertensive heart disease with heart failure: Secondary | ICD-10-CM | POA: Diagnosis not present

## 2019-08-31 DIAGNOSIS — Z7901 Long term (current) use of anticoagulants: Secondary | ICD-10-CM | POA: Diagnosis not present

## 2019-08-31 DIAGNOSIS — Z87440 Personal history of urinary (tract) infections: Secondary | ICD-10-CM | POA: Diagnosis not present

## 2019-08-31 DIAGNOSIS — H3530 Unspecified macular degeneration: Secondary | ICD-10-CM | POA: Diagnosis not present

## 2019-08-31 DIAGNOSIS — I25119 Atherosclerotic heart disease of native coronary artery with unspecified angina pectoris: Secondary | ICD-10-CM | POA: Diagnosis not present

## 2019-08-31 DIAGNOSIS — I69320 Aphasia following cerebral infarction: Secondary | ICD-10-CM | POA: Diagnosis not present

## 2019-08-31 DIAGNOSIS — I480 Paroxysmal atrial fibrillation: Secondary | ICD-10-CM | POA: Diagnosis not present

## 2019-08-31 DIAGNOSIS — E876 Hypokalemia: Secondary | ICD-10-CM | POA: Diagnosis not present

## 2019-08-31 DIAGNOSIS — Z9181 History of falling: Secondary | ICD-10-CM | POA: Diagnosis not present

## 2019-08-31 DIAGNOSIS — I69322 Dysarthria following cerebral infarction: Secondary | ICD-10-CM | POA: Diagnosis not present

## 2019-08-31 DIAGNOSIS — I255 Ischemic cardiomyopathy: Secondary | ICD-10-CM | POA: Diagnosis not present

## 2019-08-31 DIAGNOSIS — I69391 Dysphagia following cerebral infarction: Secondary | ICD-10-CM | POA: Diagnosis not present

## 2019-08-31 DIAGNOSIS — Z7902 Long term (current) use of antithrombotics/antiplatelets: Secondary | ICD-10-CM | POA: Diagnosis not present

## 2019-08-31 DIAGNOSIS — H548 Legal blindness, as defined in USA: Secondary | ICD-10-CM | POA: Diagnosis not present

## 2019-08-31 DIAGNOSIS — I69351 Hemiplegia and hemiparesis following cerebral infarction affecting right dominant side: Secondary | ICD-10-CM | POA: Diagnosis not present

## 2019-08-31 DIAGNOSIS — I5043 Acute on chronic combined systolic (congestive) and diastolic (congestive) heart failure: Secondary | ICD-10-CM | POA: Diagnosis not present

## 2019-08-31 DIAGNOSIS — R1312 Dysphagia, oropharyngeal phase: Secondary | ICD-10-CM | POA: Diagnosis not present

## 2019-09-01 DIAGNOSIS — I255 Ischemic cardiomyopathy: Secondary | ICD-10-CM | POA: Diagnosis not present

## 2019-09-01 DIAGNOSIS — Z9181 History of falling: Secondary | ICD-10-CM | POA: Diagnosis not present

## 2019-09-01 DIAGNOSIS — H3530 Unspecified macular degeneration: Secondary | ICD-10-CM | POA: Diagnosis not present

## 2019-09-01 DIAGNOSIS — Z87440 Personal history of urinary (tract) infections: Secondary | ICD-10-CM | POA: Diagnosis not present

## 2019-09-01 DIAGNOSIS — I110 Hypertensive heart disease with heart failure: Secondary | ICD-10-CM | POA: Diagnosis not present

## 2019-09-01 DIAGNOSIS — I25119 Atherosclerotic heart disease of native coronary artery with unspecified angina pectoris: Secondary | ICD-10-CM | POA: Diagnosis not present

## 2019-09-01 DIAGNOSIS — I480 Paroxysmal atrial fibrillation: Secondary | ICD-10-CM | POA: Diagnosis not present

## 2019-09-01 DIAGNOSIS — R1312 Dysphagia, oropharyngeal phase: Secondary | ICD-10-CM | POA: Diagnosis not present

## 2019-09-01 DIAGNOSIS — I69322 Dysarthria following cerebral infarction: Secondary | ICD-10-CM | POA: Diagnosis not present

## 2019-09-01 DIAGNOSIS — I69320 Aphasia following cerebral infarction: Secondary | ICD-10-CM | POA: Diagnosis not present

## 2019-09-01 DIAGNOSIS — Z7901 Long term (current) use of anticoagulants: Secondary | ICD-10-CM | POA: Diagnosis not present

## 2019-09-01 DIAGNOSIS — Z7902 Long term (current) use of antithrombotics/antiplatelets: Secondary | ICD-10-CM | POA: Diagnosis not present

## 2019-09-01 DIAGNOSIS — I69391 Dysphagia following cerebral infarction: Secondary | ICD-10-CM | POA: Diagnosis not present

## 2019-09-01 DIAGNOSIS — H548 Legal blindness, as defined in USA: Secondary | ICD-10-CM | POA: Diagnosis not present

## 2019-09-01 DIAGNOSIS — Z85038 Personal history of other malignant neoplasm of large intestine: Secondary | ICD-10-CM | POA: Diagnosis not present

## 2019-09-01 DIAGNOSIS — E876 Hypokalemia: Secondary | ICD-10-CM | POA: Diagnosis not present

## 2019-09-01 DIAGNOSIS — I5043 Acute on chronic combined systolic (congestive) and diastolic (congestive) heart failure: Secondary | ICD-10-CM | POA: Diagnosis not present

## 2019-09-01 DIAGNOSIS — I69351 Hemiplegia and hemiparesis following cerebral infarction affecting right dominant side: Secondary | ICD-10-CM | POA: Diagnosis not present

## 2019-09-04 DIAGNOSIS — E876 Hypokalemia: Secondary | ICD-10-CM | POA: Diagnosis not present

## 2019-09-04 DIAGNOSIS — I25119 Atherosclerotic heart disease of native coronary artery with unspecified angina pectoris: Secondary | ICD-10-CM | POA: Diagnosis not present

## 2019-09-04 DIAGNOSIS — Z85038 Personal history of other malignant neoplasm of large intestine: Secondary | ICD-10-CM | POA: Diagnosis not present

## 2019-09-04 DIAGNOSIS — I110 Hypertensive heart disease with heart failure: Secondary | ICD-10-CM | POA: Diagnosis not present

## 2019-09-04 DIAGNOSIS — I69320 Aphasia following cerebral infarction: Secondary | ICD-10-CM | POA: Diagnosis not present

## 2019-09-04 DIAGNOSIS — I69322 Dysarthria following cerebral infarction: Secondary | ICD-10-CM | POA: Diagnosis not present

## 2019-09-04 DIAGNOSIS — Z87440 Personal history of urinary (tract) infections: Secondary | ICD-10-CM | POA: Diagnosis not present

## 2019-09-04 DIAGNOSIS — Z7901 Long term (current) use of anticoagulants: Secondary | ICD-10-CM | POA: Diagnosis not present

## 2019-09-04 DIAGNOSIS — I480 Paroxysmal atrial fibrillation: Secondary | ICD-10-CM | POA: Diagnosis not present

## 2019-09-04 DIAGNOSIS — H3530 Unspecified macular degeneration: Secondary | ICD-10-CM | POA: Diagnosis not present

## 2019-09-04 DIAGNOSIS — I69351 Hemiplegia and hemiparesis following cerebral infarction affecting right dominant side: Secondary | ICD-10-CM | POA: Diagnosis not present

## 2019-09-04 DIAGNOSIS — I69391 Dysphagia following cerebral infarction: Secondary | ICD-10-CM | POA: Diagnosis not present

## 2019-09-04 DIAGNOSIS — H548 Legal blindness, as defined in USA: Secondary | ICD-10-CM | POA: Diagnosis not present

## 2019-09-04 DIAGNOSIS — Z7902 Long term (current) use of antithrombotics/antiplatelets: Secondary | ICD-10-CM | POA: Diagnosis not present

## 2019-09-04 DIAGNOSIS — I255 Ischemic cardiomyopathy: Secondary | ICD-10-CM | POA: Diagnosis not present

## 2019-09-04 DIAGNOSIS — Z9181 History of falling: Secondary | ICD-10-CM | POA: Diagnosis not present

## 2019-09-04 DIAGNOSIS — R1312 Dysphagia, oropharyngeal phase: Secondary | ICD-10-CM | POA: Diagnosis not present

## 2019-09-04 DIAGNOSIS — I5043 Acute on chronic combined systolic (congestive) and diastolic (congestive) heart failure: Secondary | ICD-10-CM | POA: Diagnosis not present

## 2019-09-06 ENCOUNTER — Telehealth: Payer: Self-pay | Admitting: Internal Medicine

## 2019-09-06 DIAGNOSIS — I5043 Acute on chronic combined systolic (congestive) and diastolic (congestive) heart failure: Secondary | ICD-10-CM | POA: Diagnosis not present

## 2019-09-06 DIAGNOSIS — Z7902 Long term (current) use of antithrombotics/antiplatelets: Secondary | ICD-10-CM | POA: Diagnosis not present

## 2019-09-06 DIAGNOSIS — Z9181 History of falling: Secondary | ICD-10-CM | POA: Diagnosis not present

## 2019-09-06 DIAGNOSIS — I69391 Dysphagia following cerebral infarction: Secondary | ICD-10-CM | POA: Diagnosis not present

## 2019-09-06 DIAGNOSIS — I25119 Atherosclerotic heart disease of native coronary artery with unspecified angina pectoris: Secondary | ICD-10-CM | POA: Diagnosis not present

## 2019-09-06 DIAGNOSIS — H3530 Unspecified macular degeneration: Secondary | ICD-10-CM | POA: Diagnosis not present

## 2019-09-06 DIAGNOSIS — I69322 Dysarthria following cerebral infarction: Secondary | ICD-10-CM | POA: Diagnosis not present

## 2019-09-06 DIAGNOSIS — I110 Hypertensive heart disease with heart failure: Secondary | ICD-10-CM | POA: Diagnosis not present

## 2019-09-06 DIAGNOSIS — Z87440 Personal history of urinary (tract) infections: Secondary | ICD-10-CM | POA: Diagnosis not present

## 2019-09-06 DIAGNOSIS — E876 Hypokalemia: Secondary | ICD-10-CM | POA: Diagnosis not present

## 2019-09-06 DIAGNOSIS — Z85038 Personal history of other malignant neoplasm of large intestine: Secondary | ICD-10-CM | POA: Diagnosis not present

## 2019-09-06 DIAGNOSIS — I480 Paroxysmal atrial fibrillation: Secondary | ICD-10-CM | POA: Diagnosis not present

## 2019-09-06 DIAGNOSIS — I69351 Hemiplegia and hemiparesis following cerebral infarction affecting right dominant side: Secondary | ICD-10-CM | POA: Diagnosis not present

## 2019-09-06 DIAGNOSIS — I69320 Aphasia following cerebral infarction: Secondary | ICD-10-CM | POA: Diagnosis not present

## 2019-09-06 DIAGNOSIS — H548 Legal blindness, as defined in USA: Secondary | ICD-10-CM | POA: Diagnosis not present

## 2019-09-06 DIAGNOSIS — I255 Ischemic cardiomyopathy: Secondary | ICD-10-CM | POA: Diagnosis not present

## 2019-09-06 DIAGNOSIS — R1312 Dysphagia, oropharyngeal phase: Secondary | ICD-10-CM | POA: Diagnosis not present

## 2019-09-06 DIAGNOSIS — Z7901 Long term (current) use of anticoagulants: Secondary | ICD-10-CM | POA: Diagnosis not present

## 2019-09-06 NOTE — Telephone Encounter (Signed)
OV note from 08/16/19 has been faxed.

## 2019-09-06 NOTE — Telephone Encounter (Signed)
New Message:   Pt is calling and states he needs his last office visit faxed to a Dr. Fritz Pickerel at [facility] for him to get his brace. Pt states the fax # is [phone]. Please advise.

## 2019-09-07 ENCOUNTER — Telehealth: Payer: Self-pay | Admitting: Internal Medicine

## 2019-09-07 DIAGNOSIS — Z7902 Long term (current) use of antithrombotics/antiplatelets: Secondary | ICD-10-CM | POA: Diagnosis not present

## 2019-09-07 DIAGNOSIS — Z85038 Personal history of other malignant neoplasm of large intestine: Secondary | ICD-10-CM | POA: Diagnosis not present

## 2019-09-07 DIAGNOSIS — I5043 Acute on chronic combined systolic (congestive) and diastolic (congestive) heart failure: Secondary | ICD-10-CM | POA: Diagnosis not present

## 2019-09-07 DIAGNOSIS — I480 Paroxysmal atrial fibrillation: Secondary | ICD-10-CM | POA: Diagnosis not present

## 2019-09-07 DIAGNOSIS — I255 Ischemic cardiomyopathy: Secondary | ICD-10-CM | POA: Diagnosis not present

## 2019-09-07 DIAGNOSIS — I110 Hypertensive heart disease with heart failure: Secondary | ICD-10-CM | POA: Diagnosis not present

## 2019-09-07 DIAGNOSIS — I69351 Hemiplegia and hemiparesis following cerebral infarction affecting right dominant side: Secondary | ICD-10-CM | POA: Diagnosis not present

## 2019-09-07 DIAGNOSIS — I69391 Dysphagia following cerebral infarction: Secondary | ICD-10-CM | POA: Diagnosis not present

## 2019-09-07 DIAGNOSIS — E876 Hypokalemia: Secondary | ICD-10-CM | POA: Diagnosis not present

## 2019-09-07 DIAGNOSIS — I69320 Aphasia following cerebral infarction: Secondary | ICD-10-CM | POA: Diagnosis not present

## 2019-09-07 DIAGNOSIS — I69322 Dysarthria following cerebral infarction: Secondary | ICD-10-CM | POA: Diagnosis not present

## 2019-09-07 DIAGNOSIS — H3530 Unspecified macular degeneration: Secondary | ICD-10-CM | POA: Diagnosis not present

## 2019-09-07 DIAGNOSIS — Z7901 Long term (current) use of anticoagulants: Secondary | ICD-10-CM | POA: Diagnosis not present

## 2019-09-07 DIAGNOSIS — Z87440 Personal history of urinary (tract) infections: Secondary | ICD-10-CM | POA: Diagnosis not present

## 2019-09-07 DIAGNOSIS — H548 Legal blindness, as defined in USA: Secondary | ICD-10-CM | POA: Diagnosis not present

## 2019-09-07 DIAGNOSIS — R1312 Dysphagia, oropharyngeal phase: Secondary | ICD-10-CM | POA: Diagnosis not present

## 2019-09-07 DIAGNOSIS — I25119 Atherosclerotic heart disease of native coronary artery with unspecified angina pectoris: Secondary | ICD-10-CM | POA: Diagnosis not present

## 2019-09-07 DIAGNOSIS — Z9181 History of falling: Secondary | ICD-10-CM | POA: Diagnosis not present

## 2019-09-07 NOTE — Telephone Encounter (Signed)
LVM with verbal orders.  

## 2019-09-07 NOTE — Telephone Encounter (Signed)
New message:   Mark Clayton from [facility] is calling and wants to extend the home visits for the pt to 2x a week for 4 weeks and 1x a week for 1 week. Please advise.

## 2019-09-07 NOTE — Telephone Encounter (Signed)
Fine

## 2019-09-11 DIAGNOSIS — Z87440 Personal history of urinary (tract) infections: Secondary | ICD-10-CM | POA: Diagnosis not present

## 2019-09-11 DIAGNOSIS — I25119 Atherosclerotic heart disease of native coronary artery with unspecified angina pectoris: Secondary | ICD-10-CM | POA: Diagnosis not present

## 2019-09-11 DIAGNOSIS — Z7902 Long term (current) use of antithrombotics/antiplatelets: Secondary | ICD-10-CM | POA: Diagnosis not present

## 2019-09-11 DIAGNOSIS — E876 Hypokalemia: Secondary | ICD-10-CM | POA: Diagnosis not present

## 2019-09-11 DIAGNOSIS — I69391 Dysphagia following cerebral infarction: Secondary | ICD-10-CM | POA: Diagnosis not present

## 2019-09-11 DIAGNOSIS — I255 Ischemic cardiomyopathy: Secondary | ICD-10-CM | POA: Diagnosis not present

## 2019-09-11 DIAGNOSIS — I69320 Aphasia following cerebral infarction: Secondary | ICD-10-CM | POA: Diagnosis not present

## 2019-09-11 DIAGNOSIS — H548 Legal blindness, as defined in USA: Secondary | ICD-10-CM | POA: Diagnosis not present

## 2019-09-11 DIAGNOSIS — I69322 Dysarthria following cerebral infarction: Secondary | ICD-10-CM | POA: Diagnosis not present

## 2019-09-11 DIAGNOSIS — Z85038 Personal history of other malignant neoplasm of large intestine: Secondary | ICD-10-CM | POA: Diagnosis not present

## 2019-09-11 DIAGNOSIS — I110 Hypertensive heart disease with heart failure: Secondary | ICD-10-CM | POA: Diagnosis not present

## 2019-09-11 DIAGNOSIS — R1312 Dysphagia, oropharyngeal phase: Secondary | ICD-10-CM | POA: Diagnosis not present

## 2019-09-11 DIAGNOSIS — Z9181 History of falling: Secondary | ICD-10-CM | POA: Diagnosis not present

## 2019-09-11 DIAGNOSIS — I69351 Hemiplegia and hemiparesis following cerebral infarction affecting right dominant side: Secondary | ICD-10-CM | POA: Diagnosis not present

## 2019-09-11 DIAGNOSIS — I5043 Acute on chronic combined systolic (congestive) and diastolic (congestive) heart failure: Secondary | ICD-10-CM | POA: Diagnosis not present

## 2019-09-11 DIAGNOSIS — I480 Paroxysmal atrial fibrillation: Secondary | ICD-10-CM | POA: Diagnosis not present

## 2019-09-11 DIAGNOSIS — Z7901 Long term (current) use of anticoagulants: Secondary | ICD-10-CM | POA: Diagnosis not present

## 2019-09-11 DIAGNOSIS — H3530 Unspecified macular degeneration: Secondary | ICD-10-CM | POA: Diagnosis not present

## 2019-09-12 ENCOUNTER — Telehealth: Payer: Self-pay | Admitting: Internal Medicine

## 2019-09-12 DIAGNOSIS — Z7901 Long term (current) use of anticoagulants: Secondary | ICD-10-CM | POA: Diagnosis not present

## 2019-09-12 DIAGNOSIS — R1312 Dysphagia, oropharyngeal phase: Secondary | ICD-10-CM | POA: Diagnosis not present

## 2019-09-12 DIAGNOSIS — I69320 Aphasia following cerebral infarction: Secondary | ICD-10-CM | POA: Diagnosis not present

## 2019-09-12 DIAGNOSIS — I69322 Dysarthria following cerebral infarction: Secondary | ICD-10-CM | POA: Diagnosis not present

## 2019-09-12 DIAGNOSIS — I480 Paroxysmal atrial fibrillation: Secondary | ICD-10-CM | POA: Diagnosis not present

## 2019-09-12 DIAGNOSIS — I5043 Acute on chronic combined systolic (congestive) and diastolic (congestive) heart failure: Secondary | ICD-10-CM | POA: Diagnosis not present

## 2019-09-12 DIAGNOSIS — Z85038 Personal history of other malignant neoplasm of large intestine: Secondary | ICD-10-CM | POA: Diagnosis not present

## 2019-09-12 DIAGNOSIS — Z7902 Long term (current) use of antithrombotics/antiplatelets: Secondary | ICD-10-CM | POA: Diagnosis not present

## 2019-09-12 DIAGNOSIS — Z9181 History of falling: Secondary | ICD-10-CM | POA: Diagnosis not present

## 2019-09-12 DIAGNOSIS — E876 Hypokalemia: Secondary | ICD-10-CM | POA: Diagnosis not present

## 2019-09-12 DIAGNOSIS — I25119 Atherosclerotic heart disease of native coronary artery with unspecified angina pectoris: Secondary | ICD-10-CM | POA: Diagnosis not present

## 2019-09-12 DIAGNOSIS — H3530 Unspecified macular degeneration: Secondary | ICD-10-CM | POA: Diagnosis not present

## 2019-09-12 DIAGNOSIS — I69391 Dysphagia following cerebral infarction: Secondary | ICD-10-CM | POA: Diagnosis not present

## 2019-09-12 DIAGNOSIS — I110 Hypertensive heart disease with heart failure: Secondary | ICD-10-CM | POA: Diagnosis not present

## 2019-09-12 DIAGNOSIS — H548 Legal blindness, as defined in USA: Secondary | ICD-10-CM | POA: Diagnosis not present

## 2019-09-12 DIAGNOSIS — I255 Ischemic cardiomyopathy: Secondary | ICD-10-CM | POA: Diagnosis not present

## 2019-09-12 DIAGNOSIS — I69351 Hemiplegia and hemiparesis following cerebral infarction affecting right dominant side: Secondary | ICD-10-CM | POA: Diagnosis not present

## 2019-09-12 DIAGNOSIS — Z87440 Personal history of urinary (tract) infections: Secondary | ICD-10-CM | POA: Diagnosis not present

## 2019-09-12 NOTE — Telephone Encounter (Signed)
Daleen Snook with [facility] called and was requesting verbal orders for physical therapy 2 times a week for 4 weeks and 1 time a week for 5 weeks. Also she was wondering if she could get a continuance for the NMES for 30 minutes if needed for the hipflexer quadriceps interior tibialis and pulse and intensity per patient tolerance.

## 2019-09-12 NOTE — Telephone Encounter (Signed)
Verbal orders given  

## 2019-09-12 NOTE — Telephone Encounter (Signed)
Fine

## 2019-09-13 ENCOUNTER — Ambulatory Visit (INDEPENDENT_AMBULATORY_CARE_PROVIDER_SITE_OTHER): Payer: Medicare Other

## 2019-09-13 DIAGNOSIS — I5022 Chronic systolic (congestive) heart failure: Secondary | ICD-10-CM

## 2019-09-13 DIAGNOSIS — Z95810 Presence of automatic (implantable) cardiac defibrillator: Secondary | ICD-10-CM

## 2019-09-13 DIAGNOSIS — M21371 Foot drop, right foot: Secondary | ICD-10-CM | POA: Diagnosis not present

## 2019-09-13 NOTE — Progress Notes (Signed)
EPIC Encounter for ICM Monitoring  Patient Name: Mark Clayton is a 79 y.o. male Date: 09/13/2019 Primary Care Physican: [name], MD Cardiologist:End Electrophysiologist: Caryl Comes 6/1/2021Weight: 143-144lbs  Time in AF        24.0 hr/day (100.0%)  (taking Xarelto)    Spoke with patient and reports feeling well at this time.  Denies fluid symptoms.    OptivolThoracic impedancereturned to close to baseline normal.  Prescribed:  Furosemide 40 mg 1 tablet daily.   Potassium 10 mEq take 1 tablet daily  Labs: 07/27/2019 Creatinine1.17, BUN9, Potassium4.9, [id number] 07/03/2019 Creatinine1.14, BUN8, Potassium4.0, Sodium131, GFR>60  06/26/2019 Creatinine1.00, BUN10, Potassium4.2, [id number], GFR>60  06/19/2019 Creatinine1.18, BUN16, Potassium3.6, [id number], GFR59->60  06/15/2019 Creatinine1.08, BUN14, Potassium3.8, Sodium136, GFR>60  06/14/2019 Creatinine1.06, BUN17, Potassium4.0, Sodium138, GFR>60  06/13/2019 Creatinine1.04, BUN17, Potassium3.2, Sodium139, GFR>60  06/10/2019 Creatinine1.07, BUN25, Potassium4.0, Sodium139, GFR>60  06/09/2019 Creatinine1.12, BUN22, Potassium3.6, Sodium142, GFR>60 A complete set of results can be found in Results Review.  Recommendations:  No changes and encouraged to call if experiencing any fluid symptoms.   Follow-up plan: ICM clinic phone appointment on7/13/2021. 91 day device clinic remote transmission7/28/2021.  Copy of ICM check sent to [place].  3 month ICM trend: 09/13/2019    1 Year ICM trend:       Rosalene Billings, RN 09/13/2019 8:38 AM

## 2019-09-18 DIAGNOSIS — E876 Hypokalemia: Secondary | ICD-10-CM | POA: Diagnosis not present

## 2019-09-18 DIAGNOSIS — Z85038 Personal history of other malignant neoplasm of large intestine: Secondary | ICD-10-CM | POA: Diagnosis not present

## 2019-09-18 DIAGNOSIS — I255 Ischemic cardiomyopathy: Secondary | ICD-10-CM | POA: Diagnosis not present

## 2019-09-18 DIAGNOSIS — I480 Paroxysmal atrial fibrillation: Secondary | ICD-10-CM | POA: Diagnosis not present

## 2019-09-18 DIAGNOSIS — Z7901 Long term (current) use of anticoagulants: Secondary | ICD-10-CM | POA: Diagnosis not present

## 2019-09-18 DIAGNOSIS — I69320 Aphasia following cerebral infarction: Secondary | ICD-10-CM | POA: Diagnosis not present

## 2019-09-18 DIAGNOSIS — Z7902 Long term (current) use of antithrombotics/antiplatelets: Secondary | ICD-10-CM | POA: Diagnosis not present

## 2019-09-18 DIAGNOSIS — I69351 Hemiplegia and hemiparesis following cerebral infarction affecting right dominant side: Secondary | ICD-10-CM | POA: Diagnosis not present

## 2019-09-18 DIAGNOSIS — I69391 Dysphagia following cerebral infarction: Secondary | ICD-10-CM | POA: Diagnosis not present

## 2019-09-18 DIAGNOSIS — I25119 Atherosclerotic heart disease of native coronary artery with unspecified angina pectoris: Secondary | ICD-10-CM | POA: Diagnosis not present

## 2019-09-18 DIAGNOSIS — I69322 Dysarthria following cerebral infarction: Secondary | ICD-10-CM | POA: Diagnosis not present

## 2019-09-18 DIAGNOSIS — Z9181 History of falling: Secondary | ICD-10-CM | POA: Diagnosis not present

## 2019-09-18 DIAGNOSIS — R1312 Dysphagia, oropharyngeal phase: Secondary | ICD-10-CM | POA: Diagnosis not present

## 2019-09-18 DIAGNOSIS — Z87440 Personal history of urinary (tract) infections: Secondary | ICD-10-CM | POA: Diagnosis not present

## 2019-09-18 DIAGNOSIS — H548 Legal blindness, as defined in USA: Secondary | ICD-10-CM | POA: Diagnosis not present

## 2019-09-18 DIAGNOSIS — I110 Hypertensive heart disease with heart failure: Secondary | ICD-10-CM | POA: Diagnosis not present

## 2019-09-18 DIAGNOSIS — I5043 Acute on chronic combined systolic (congestive) and diastolic (congestive) heart failure: Secondary | ICD-10-CM | POA: Diagnosis not present

## 2019-09-18 DIAGNOSIS — H3530 Unspecified macular degeneration: Secondary | ICD-10-CM | POA: Diagnosis not present

## 2019-09-19 DIAGNOSIS — I69322 Dysarthria following cerebral infarction: Secondary | ICD-10-CM | POA: Diagnosis not present

## 2019-09-19 DIAGNOSIS — Z7902 Long term (current) use of antithrombotics/antiplatelets: Secondary | ICD-10-CM | POA: Diagnosis not present

## 2019-09-19 DIAGNOSIS — Z85038 Personal history of other malignant neoplasm of large intestine: Secondary | ICD-10-CM | POA: Diagnosis not present

## 2019-09-19 DIAGNOSIS — I480 Paroxysmal atrial fibrillation: Secondary | ICD-10-CM | POA: Diagnosis not present

## 2019-09-19 DIAGNOSIS — I110 Hypertensive heart disease with heart failure: Secondary | ICD-10-CM | POA: Diagnosis not present

## 2019-09-19 DIAGNOSIS — H548 Legal blindness, as defined in USA: Secondary | ICD-10-CM | POA: Diagnosis not present

## 2019-09-19 DIAGNOSIS — Z9181 History of falling: Secondary | ICD-10-CM | POA: Diagnosis not present

## 2019-09-19 DIAGNOSIS — R1312 Dysphagia, oropharyngeal phase: Secondary | ICD-10-CM | POA: Diagnosis not present

## 2019-09-19 DIAGNOSIS — I5043 Acute on chronic combined systolic (congestive) and diastolic (congestive) heart failure: Secondary | ICD-10-CM | POA: Diagnosis not present

## 2019-09-19 DIAGNOSIS — I25119 Atherosclerotic heart disease of native coronary artery with unspecified angina pectoris: Secondary | ICD-10-CM | POA: Diagnosis not present

## 2019-09-19 DIAGNOSIS — I69320 Aphasia following cerebral infarction: Secondary | ICD-10-CM | POA: Diagnosis not present

## 2019-09-19 DIAGNOSIS — H3530 Unspecified macular degeneration: Secondary | ICD-10-CM | POA: Diagnosis not present

## 2019-09-19 DIAGNOSIS — Z7901 Long term (current) use of anticoagulants: Secondary | ICD-10-CM | POA: Diagnosis not present

## 2019-09-19 DIAGNOSIS — Z87440 Personal history of urinary (tract) infections: Secondary | ICD-10-CM | POA: Diagnosis not present

## 2019-09-19 DIAGNOSIS — I69351 Hemiplegia and hemiparesis following cerebral infarction affecting right dominant side: Secondary | ICD-10-CM | POA: Diagnosis not present

## 2019-09-19 DIAGNOSIS — I255 Ischemic cardiomyopathy: Secondary | ICD-10-CM | POA: Diagnosis not present

## 2019-09-19 DIAGNOSIS — E876 Hypokalemia: Secondary | ICD-10-CM | POA: Diagnosis not present

## 2019-09-19 DIAGNOSIS — I69391 Dysphagia following cerebral infarction: Secondary | ICD-10-CM | POA: Diagnosis not present

## 2019-09-22 DIAGNOSIS — I110 Hypertensive heart disease with heart failure: Secondary | ICD-10-CM | POA: Diagnosis not present

## 2019-09-22 DIAGNOSIS — I69322 Dysarthria following cerebral infarction: Secondary | ICD-10-CM | POA: Diagnosis not present

## 2019-09-22 DIAGNOSIS — I69391 Dysphagia following cerebral infarction: Secondary | ICD-10-CM | POA: Diagnosis not present

## 2019-09-22 DIAGNOSIS — I480 Paroxysmal atrial fibrillation: Secondary | ICD-10-CM | POA: Diagnosis not present

## 2019-09-22 DIAGNOSIS — H548 Legal blindness, as defined in USA: Secondary | ICD-10-CM | POA: Diagnosis not present

## 2019-09-22 DIAGNOSIS — Z9181 History of falling: Secondary | ICD-10-CM | POA: Diagnosis not present

## 2019-09-22 DIAGNOSIS — Z85038 Personal history of other malignant neoplasm of large intestine: Secondary | ICD-10-CM | POA: Diagnosis not present

## 2019-09-22 DIAGNOSIS — H3530 Unspecified macular degeneration: Secondary | ICD-10-CM | POA: Diagnosis not present

## 2019-09-22 DIAGNOSIS — R1312 Dysphagia, oropharyngeal phase: Secondary | ICD-10-CM | POA: Diagnosis not present

## 2019-09-22 DIAGNOSIS — I69320 Aphasia following cerebral infarction: Secondary | ICD-10-CM | POA: Diagnosis not present

## 2019-09-22 DIAGNOSIS — Z7901 Long term (current) use of anticoagulants: Secondary | ICD-10-CM | POA: Diagnosis not present

## 2019-09-22 DIAGNOSIS — I5043 Acute on chronic combined systolic (congestive) and diastolic (congestive) heart failure: Secondary | ICD-10-CM | POA: Diagnosis not present

## 2019-09-22 DIAGNOSIS — I69351 Hemiplegia and hemiparesis following cerebral infarction affecting right dominant side: Secondary | ICD-10-CM | POA: Diagnosis not present

## 2019-09-22 DIAGNOSIS — E876 Hypokalemia: Secondary | ICD-10-CM | POA: Diagnosis not present

## 2019-09-22 DIAGNOSIS — I25119 Atherosclerotic heart disease of native coronary artery with unspecified angina pectoris: Secondary | ICD-10-CM | POA: Diagnosis not present

## 2019-09-22 DIAGNOSIS — Z87440 Personal history of urinary (tract) infections: Secondary | ICD-10-CM | POA: Diagnosis not present

## 2019-09-22 DIAGNOSIS — I255 Ischemic cardiomyopathy: Secondary | ICD-10-CM | POA: Diagnosis not present

## 2019-09-22 DIAGNOSIS — Z7902 Long term (current) use of antithrombotics/antiplatelets: Secondary | ICD-10-CM | POA: Diagnosis not present

## 2019-09-24 DIAGNOSIS — I69391 Dysphagia following cerebral infarction: Secondary | ICD-10-CM | POA: Diagnosis not present

## 2019-09-24 DIAGNOSIS — H3530 Unspecified macular degeneration: Secondary | ICD-10-CM | POA: Diagnosis not present

## 2019-09-24 DIAGNOSIS — H548 Legal blindness, as defined in USA: Secondary | ICD-10-CM | POA: Diagnosis not present

## 2019-09-24 DIAGNOSIS — I480 Paroxysmal atrial fibrillation: Secondary | ICD-10-CM | POA: Diagnosis not present

## 2019-09-24 DIAGNOSIS — I255 Ischemic cardiomyopathy: Secondary | ICD-10-CM | POA: Diagnosis not present

## 2019-09-24 DIAGNOSIS — R1312 Dysphagia, oropharyngeal phase: Secondary | ICD-10-CM | POA: Diagnosis not present

## 2019-09-24 DIAGNOSIS — Z7902 Long term (current) use of antithrombotics/antiplatelets: Secondary | ICD-10-CM | POA: Diagnosis not present

## 2019-09-24 DIAGNOSIS — I25119 Atherosclerotic heart disease of native coronary artery with unspecified angina pectoris: Secondary | ICD-10-CM | POA: Diagnosis not present

## 2019-09-24 DIAGNOSIS — I5043 Acute on chronic combined systolic (congestive) and diastolic (congestive) heart failure: Secondary | ICD-10-CM | POA: Diagnosis not present

## 2019-09-24 DIAGNOSIS — I69351 Hemiplegia and hemiparesis following cerebral infarction affecting right dominant side: Secondary | ICD-10-CM | POA: Diagnosis not present

## 2019-09-24 DIAGNOSIS — Z87440 Personal history of urinary (tract) infections: Secondary | ICD-10-CM | POA: Diagnosis not present

## 2019-09-24 DIAGNOSIS — I69320 Aphasia following cerebral infarction: Secondary | ICD-10-CM | POA: Diagnosis not present

## 2019-09-24 DIAGNOSIS — E876 Hypokalemia: Secondary | ICD-10-CM | POA: Diagnosis not present

## 2019-09-24 DIAGNOSIS — I110 Hypertensive heart disease with heart failure: Secondary | ICD-10-CM | POA: Diagnosis not present

## 2019-09-24 DIAGNOSIS — Z85038 Personal history of other malignant neoplasm of large intestine: Secondary | ICD-10-CM | POA: Diagnosis not present

## 2019-09-24 DIAGNOSIS — Z9181 History of falling: Secondary | ICD-10-CM | POA: Diagnosis not present

## 2019-09-24 DIAGNOSIS — Z7901 Long term (current) use of anticoagulants: Secondary | ICD-10-CM | POA: Diagnosis not present

## 2019-09-24 DIAGNOSIS — I69322 Dysarthria following cerebral infarction: Secondary | ICD-10-CM | POA: Diagnosis not present

## 2019-09-25 ENCOUNTER — Encounter (INDEPENDENT_AMBULATORY_CARE_PROVIDER_SITE_OTHER): Payer: Self-pay | Admitting: Ophthalmology

## 2019-09-25 ENCOUNTER — Other Ambulatory Visit: Payer: Self-pay

## 2019-09-25 ENCOUNTER — Ambulatory Visit (INDEPENDENT_AMBULATORY_CARE_PROVIDER_SITE_OTHER): Payer: Medicare Other | Admitting: Ophthalmology

## 2019-09-25 DIAGNOSIS — H353211 Exudative age-related macular degeneration, right eye, with active choroidal neovascularization: Secondary | ICD-10-CM

## 2019-09-25 DIAGNOSIS — H353221 Exudative age-related macular degeneration, left eye, with active choroidal neovascularization: Secondary | ICD-10-CM

## 2019-09-25 MED ORDER — AFLIBERCEPT 2MG/0.05ML IZ SOLN FOR KALEIDOSCOPE
2.0000 mg | INTRAVITREAL | Status: AC | PRN
  Administered 2019-09-25: 2 mg via INTRAVITREAL

## 2019-09-25 NOTE — Assessment & Plan Note (Signed)
This lesion is stable and in active OS

## 2019-09-25 NOTE — Progress Notes (Signed)
09/25/2019     CHIEF COMPLAINT Patient presents for Retina Follow Up   HISTORY OF PRESENT ILLNESS: Mark Clayton is a 79 y.o. male who presents to the clinic today for:   HPI    Retina Follow Up    Patient presents with  Wet AMD.  In right eye.  Duration of 9 weeks.  Since onset it is stable.          Comments    9 week follow up -  OCT OU, Poss Eylea OD Patient denies change in vision and overall has no complaints.        Last edited by Gerda Diss on 09/25/2019  9:58 AM. (History)      Referring physician: [name], MD [place],  [place] [id number]  HISTORICAL INFORMATION:   Selected notes from the MEDICAL RECORD NUMBER    Lab Results  Component Value Date   HGBA1C 6.1 (H) 06/07/2019     CURRENT MEDICATIONS: No current outpatient medications on file. (Ophthalmic Drugs)   No current facility-administered medications for this visit. (Ophthalmic Drugs)   Current Outpatient Medications (Other)  Medication Sig  . acetaminophen (TYLENOL) 325 MG tablet Take 1-2 tablets (325-650 mg total) by mouth every 4 (four) hours as needed for mild pain.  Marland Kitchen atorvastatin (LIPITOR) 80 MG tablet Take 1 tablet (80 mg total) by mouth daily.  . carvedilol (COREG) 6.25 MG tablet Take 1 tablet (6.25 mg total) by mouth 2 (two) times daily with a meal.  . clopidogrel (PLAVIX) 75 MG tablet Take 1 tablet (75 mg total) by mouth daily.  Marland Kitchen ezetimibe (ZETIA) 10 MG tablet Take 1 tablet (10 mg total) by mouth daily.  . furosemide (LASIX) 40 MG tablet TAKE 1 TABLET BY MOUTH  DAILY  . isosorbide mononitrate (IMDUR) 30 MG 24 hr tablet TAKE 1 TABLET BY MOUTH  DAILY  . losartan (COZAAR) 50 MG tablet TAKE 1 TABLET BY MOUTH  DAILY  . meclizine (ANTIVERT) 12.5 MG tablet Take 1 tablet (12.5 mg total) by mouth 2 (two) times daily as needed for dizziness.  . nitroGLYCERIN (NITROSTAT) 0.4 MG SL tablet   . pantoprazole (PROTONIX) 40 MG tablet TAKE 1 TABLET BY MOUTH  DAILY  .  polyethylene glycol (MIRALAX / GLYCOLAX) 17 g packet Take 17 g by mouth daily.  . potassium chloride (KLOR-CON) 10 MEQ tablet Take 1 tablet (10 mEq total) by mouth daily.  . rivaroxaban (XARELTO) 20 MG TABS tablet Take 1 tablet (20 mg total) by mouth daily with supper.  . vitamin B-12 (CYANOCOBALAMIN) 100 MCG tablet Take 100 mcg by mouth daily.    No current facility-administered medications for this visit. (Other)      REVIEW OF SYSTEMS:    ALLERGIES Allergies  Allergen Reactions  . Latex Rash  . Tape Rash and Other (See Comments)    USE PAPER    PAST MEDICAL HISTORY Past Medical History:  Diagnosis Date  . AICD (automatic cardioverter/defibrillator) present 01/17/2003   Medtronic Maximo 7232CX ICD, serial [id number]  . Anemia 02-06-11   takes oral iron  . Arthritis    hands, knees  . CAD (coronary artery disease) 2003   a. h/o MI and CABG in 2003. b. s/p DES to SVG-RPDA-RPLB in 08/2014.  Marland Kitchen Cancer of sigmoid colon ([place]) 2012   a. s/p colon surgery.  . Carotid bruit   . Chronic systolic CHF (congestive heart failure) (HCC)    a. EF 20% in 2014;  b. 08/2017 Echo: EF 20-25%, diff HK, Gr3 DD. Triv AI. Mod MR. Sev dil LA. Mildly dil RV w/ mildly reduced RV fxn. Mildly dil RA. Mod TR. PASP 66mHg.  Marland Kitchen Cough   . GERD (gastroesophageal reflux disease) 02-06-11  . HTN (hypertension)   . Hyperlipidemia   . Ischemic cardiomyopathy    a. EF 20% in 2014. (Master study EF >20%); b. 08/2017 Echo: EF 20-25%, diff HK. Gr3 DD.  . LV (left ventricular) mural thrombus    a. 12/2012 Echo: EF 20% with mural thrombus No evidence of thrombus on 08/2017 echo.  . Macular degeneration   . Myocardial infarct ([place])    2003  . PAF (paroxysmal atrial fibrillation) (HCC)    a. CHA2DS2VASc = 5-->Xarelto/Tikosyn.   Past Surgical History:  Procedure Laterality Date  . CARDIAC CATHETERIZATION N/A 09/20/2014   Procedure: Left Heart Cath and Coronary Angiography;  Surgeon: Jettie Booze, MD; LAD 95%,  D1 100%, CFX liner percent, OM 200%, OM 390%, RCA 90%, LIMA-LAD okay, SVG-OM 2-OM 3 minimal disease, SVG-RPDA-RPLB 100% between the RPDA and RPL     . CARDIAC CATHETERIZATION N/A 09/20/2014   Procedure: Coronary Stent Intervention;  Surgeon: Jettie Booze, MD; Synergy DES 4 x 24 mm reducing the stenosis to 5%   . CARDIAC DEFIBRILLATOR PLACEMENT  01/17/03   6949 lead. Medtronic. remote-no; with later revision  . CARDIOVERSION N/A 05/22/2016   Procedure: CARDIOVERSION;  Surgeon: Lelon Perla, MD;  Location: [facility] ENDOSCOPY;  Service: Cardiovascular;  Laterality: N/A;  . CARDIOVERSION N/A 08/10/2017   Procedure: CARDIOVERSION;  Surgeon: Pixie Casino, MD;  Location: [facility] ENDOSCOPY;  Service: Cardiovascular;  Laterality: N/A;  . CATARACT EXTRACTION W/ INTRAOCULAR LENS  IMPLANT, BILATERAL Bilateral June/-July 2009   Dr. Katy Fitch  . CATARACT EXTRACTION W/PHACO Bilateral 2010   Dr. Katy Fitch  . COLON RESECTION  02/09/2011   Procedure: LAPAROSCOPIC SIGMOID COLON RESECTION;  Surgeon: Pedro Earls, MD;  Location: [facility] ORS;  Service: General;  Laterality: N/A;  Laparoscopic Assisted Sigmoid Colectomy  . COLON SURGERY    . COLONOSCOPY  08/31/2011   Procedure: COLONOSCOPY;  Surgeon: Jerene Bears, MD;  Location: [facility] ENDOSCOPY;  Service: Gastroenterology;  Laterality: N/A;  . COLONOSCOPY N/A 09/05/2012   Procedure: COLONOSCOPY;  Surgeon: Jerene Bears, MD;  Location: [facility] ENDOSCOPY;  Service: Gastroenterology;  Laterality: N/A;  . COLONOSCOPY N/A 04/18/2013   Procedure: COLONOSCOPY;  Surgeon: Jerene Bears, MD;  Location: [facility] ENDOSCOPY;  Service: Gastroenterology;  Laterality: N/A;  . COLONOSCOPY N/A 04/09/2014   Procedure: COLONOSCOPY;  Surgeon: Jerene Bears, MD;  Location: [facility] ENDOSCOPY;  Service: Gastroenterology;  Laterality: N/A;  . COLONOSCOPY WITH PROPOFOL N/A 05/03/2017   Procedure: COLONOSCOPY WITH PROPOFOL;  Surgeon: Jerene Bears, MD;  Location: [facility] ENDOSCOPY;  Service: Gastroenterology;  Laterality: N/A;  .  CORONARY ANGIOPLASTY    . CORONARY ARTERY BYPASS GRAFT  01/2002   LIMA-LAD, SVG-OM 2-OM 3, SVG-RPDA-RPLB  . CORONARY STENT INTERVENTION N/A 11/22/2018   Procedure: CORONARY STENT INTERVENTION;  Surgeon: Nelva Bush, MD;  Location: [facility] CV LAB;  Service: Cardiovascular;  Laterality: N/A;  SVG - RCA  . ICD GENERATOR CHANGE  2010   Medtronic Virtuoso II VR ICD  . ICD GENERATOR CHANGEOUT N/A 12/07/2018   Procedure: ICD GENERATOR CHANGEOUT;  Surgeon: Deboraha Sprang, MD;  Location: [facility] CV LAB;  Service: Cardiovascular;  Laterality: N/A;  . INGUINAL HERNIA REPAIR Right 2000's X 2  . LAPAROSCOPIC RIGHT HEMI COLECTOMY N/A  11/04/2012   Procedure: LAPAROSCOPIC RIGHT HEMI COLECTOMY;  Surgeon: Pedro Earls, MD;  Location: [facility] ORS;  Service: General;  Laterality: N/A;  . LEFT HEART CATH AND CORS/GRAFTS ANGIOGRAPHY Left 11/22/2018   Procedure: LEFT HEART CATH AND CORS/GRAFTS ANGIOGRAPHY;  Surgeon: Nelva Bush, MD;  Location: [facility] CV LAB;  Service: Cardiovascular;  Laterality: Left;  . TONSILLECTOMY  ~ 1950    FAMILY HISTORY Family History  Problem Relation Age of Onset  . Hypertension Father   . Hyperlipidemia Father   . Heart disease Father   . Prostate cancer Father   . Alzheimer's disease Mother   . Hypertension Sister   . Hyperlipidemia Sister   . Colon cancer Neg Hx   . Esophageal cancer Neg Hx   . Rectal cancer Neg Hx   . Stomach cancer Neg Hx     SOCIAL HISTORY Social History   Tobacco Use  . Smoking status: Never Smoker  . Smokeless tobacco: Never Used  Vaping Use  . Vaping Use: Never used  Substance Use Topics  . Alcohol use: Yes    Alcohol/week: 2.0 standard drinks    Types: 2 Cans of beer per week  . Drug use: No         OPHTHALMIC EXAM:  Base Eye Exam    Visual Acuity (Snellen - Linear)      Right Left   Dist [place] 20/100-2 CF @ 2'   Dist ph [place] NI NI       Tonometry (Tonopen, 10:06 AM)      Right Left   Pressure 9 11        Pupils      Pupils Dark Light Shape React APD   Right PERRL 2 1 Round Brisk None   Left PERRL 3 2 Round Brisk None       Visual Fields (Counting fingers)      Left Right    Full Full       Extraocular Movement      Right Left    Full Full       Neuro/Psych    Oriented x3: Yes   Mood/Affect: Normal       Dilation    Right eye: 1.0% Mydriacyl, 2.5% Phenylephrine @ 10:06 AM        Slit Lamp and Fundus Exam    External Exam      Right Left   External Normal Normal       Slit Lamp Exam      Right Left   Lids/Lashes Normal Normal   Conjunctiva/Sclera White and quiet White and quiet   Cornea Clear Clear   Anterior Chamber Deep and quiet Deep and quiet   Iris Round and reactive Round and reactive   Lens Posterior chamber intraocular lens Posterior chamber intraocular lens   Anterior Vitreous Normal Normal       Fundus Exam      Right Left   Posterior Vitreous Normal    Disc Normal    C/D Ratio 0.25    Macula Hard drusen, Advanced age related macular degeneration, Retinal pigment epithelial atrophy, Macular thickening, Subretinal neovascular membrane fibrotic    Vessels Normal    Periphery Normal           IMAGING AND PROCEDURES  Imaging and Procedures for 09/25/19  OCT, Retina - OU - Both Eyes       Right Eye Quality was good. Scan locations included subfoveal. Central Foveal Thickness: 406. Progression has been  stable. Findings include choroidal neovascular membrane, cystoid macular edema, outer retinal atrophy, central retinal atrophy, subretinal hyper-reflective material, disciform scar.   Left Eye Quality was good. Scan locations included subfoveal. Findings include cystoid macular edema, retinal drusen , subretinal scarring, central retinal atrophy.        Intravitreal Injection, Pharmacologic Agent - OD - Right Eye       Time Out 09/25/2019. 10:54 AM. Confirmed correct patient, procedure, site, and patient consented.   Anesthesia Topical  anesthesia was used. Anesthetic medications included Akten 3.5%.   Procedure Preparation included 10% betadine to eyelids, Ofloxacin . A 30 gauge needle was used.   Injection:  2 mg aflibercept Alfonse Flavors) SOLN   NDC: [id number], Lot: [id number]   Route: Intravitreal, Site: Right Eye, Waste: 0 mg  Post-op Post injection exam found visual acuity of at least counting fingers. The patient tolerated the procedure well. There were no complications. The patient received written and verbal post procedure care education. Post injection medications were not given.                 ASSESSMENT/PLAN:  Exudative age-related macular degeneration of right eye with active choroidal neovascularization (HCC) At 9-week interval, stable active CNVM on intravitreal Eylea.  Repeat today exam in 10 weeks  Exudative age-related macular degeneration of left eye with active choroidal neovascularization ([place]) This lesion is stable and in active OS      ICD-10-CM   1. Exudative age-related macular degeneration of right eye with active choroidal neovascularization (HCC)  H35.3211 OCT, Retina - OU - Both Eyes    Intravitreal Injection, Pharmacologic Agent - OD - Right Eye    aflibercept (EYLEA) SOLN 2 mg  2. Exudative age-related macular degeneration of left eye with active choroidal neovascularization ([place])  H35.3221     1.  2.  3.  Ophthalmic Meds Ordered this visit:  Meds ordered this encounter  Medications  . aflibercept (EYLEA) SOLN 2 mg       Return in about 10 weeks (around 12/04/2019) for DILATE OU, EYLEA OCT, OD.  There are no Patient Instructions on file for this visit.   Explained the diagnoses, plan, and follow up with the patient and they expressed understanding.  Patient expressed understanding of the importance of proper follow up care.   Clent Demark Shamar Engelmann M.D. Diseases & Surgery of the Retina and Vitreous [facility]ina [facility] [place] 09/25/19     Abbreviations: M myopia  (nearsighted); A astigmatism; H hyperopia (farsighted); P presbyopia; Mrx spectacle prescription;  CTL contact lenses; OD right eye; OS left eye; OU both eyes  XT exotropia; ET esotropia; PEK punctate epithelial keratitis; PEE punctate epithelial erosions; DES dry eye syndrome; MGD meibomian gland dysfunction; ATs artificial tears; PFAT's preservative free artificial tears; [place] nuclear sclerotic cataract; PSC posterior subcapsular cataract; ERM epi-retinal membrane; PVD posterior vitreous detachment; RD retinal detachment; DM diabetes mellitus; DR diabetic retinopathy; NPDR non-proliferative diabetic retinopathy; PDR proliferative diabetic retinopathy; CSME clinically significant macular edema; DME diabetic macular edema; dbh dot blot hemorrhages; CWS cotton wool spot; POAG primary open angle glaucoma; C/D cup-to-disc ratio; HVF humphrey visual field; GVF goldmann visual field; OCT optical coherence tomography; IOP intraocular pressure; BRVO Branch retinal vein occlusion; CRVO central retinal vein occlusion; CRAO central retinal artery occlusion; BRAO branch retinal artery occlusion; RT retinal tear; SB scleral buckle; PPV pars plana vitrectomy; VH Vitreous hemorrhage; PRP panretinal laser photocoagulation; IVK intravitreal kenalog; VMT vitreomacular traction; MH Macular hole;  NVD neovascularization of the disc; NVE neovascularization  elsewhere; AREDS age related eye disease study; ARMD age related macular degeneration; POAG primary open angle glaucoma; EBMD epithelial/anterior basement membrane dystrophy; ACIOL anterior chamber intraocular lens; IOL intraocular lens; PCIOL posterior chamber intraocular lens; Phaco/IOL phacoemulsification with intraocular lens placement; PRK photorefractive keratectomy; LASIK laser assisted in situ keratomileusis; HTN hypertension; DM diabetes mellitus; COPD chronic obstructive pulmonary disease 

## 2019-09-25 NOTE — Assessment & Plan Note (Signed)
At 9-week interval, stable active CNVM on intravitreal Eylea.  Repeat today exam in 10 weeks

## 2019-09-26 DIAGNOSIS — I69351 Hemiplegia and hemiparesis following cerebral infarction affecting right dominant side: Secondary | ICD-10-CM | POA: Diagnosis not present

## 2019-09-26 DIAGNOSIS — Z7902 Long term (current) use of antithrombotics/antiplatelets: Secondary | ICD-10-CM | POA: Diagnosis not present

## 2019-09-26 DIAGNOSIS — H548 Legal blindness, as defined in USA: Secondary | ICD-10-CM | POA: Diagnosis not present

## 2019-09-26 DIAGNOSIS — H3530 Unspecified macular degeneration: Secondary | ICD-10-CM | POA: Diagnosis not present

## 2019-09-26 DIAGNOSIS — I69320 Aphasia following cerebral infarction: Secondary | ICD-10-CM | POA: Diagnosis not present

## 2019-09-26 DIAGNOSIS — I110 Hypertensive heart disease with heart failure: Secondary | ICD-10-CM | POA: Diagnosis not present

## 2019-09-26 DIAGNOSIS — Z85038 Personal history of other malignant neoplasm of large intestine: Secondary | ICD-10-CM | POA: Diagnosis not present

## 2019-09-26 DIAGNOSIS — Z7901 Long term (current) use of anticoagulants: Secondary | ICD-10-CM | POA: Diagnosis not present

## 2019-09-26 DIAGNOSIS — E876 Hypokalemia: Secondary | ICD-10-CM | POA: Diagnosis not present

## 2019-09-26 DIAGNOSIS — I69391 Dysphagia following cerebral infarction: Secondary | ICD-10-CM | POA: Diagnosis not present

## 2019-09-26 DIAGNOSIS — I480 Paroxysmal atrial fibrillation: Secondary | ICD-10-CM | POA: Diagnosis not present

## 2019-09-26 DIAGNOSIS — I255 Ischemic cardiomyopathy: Secondary | ICD-10-CM | POA: Diagnosis not present

## 2019-09-26 DIAGNOSIS — Z9181 History of falling: Secondary | ICD-10-CM | POA: Diagnosis not present

## 2019-09-26 DIAGNOSIS — Z87440 Personal history of urinary (tract) infections: Secondary | ICD-10-CM | POA: Diagnosis not present

## 2019-09-26 DIAGNOSIS — I25119 Atherosclerotic heart disease of native coronary artery with unspecified angina pectoris: Secondary | ICD-10-CM | POA: Diagnosis not present

## 2019-09-26 DIAGNOSIS — R1312 Dysphagia, oropharyngeal phase: Secondary | ICD-10-CM | POA: Diagnosis not present

## 2019-09-26 DIAGNOSIS — I69322 Dysarthria following cerebral infarction: Secondary | ICD-10-CM | POA: Diagnosis not present

## 2019-09-26 DIAGNOSIS — I5043 Acute on chronic combined systolic (congestive) and diastolic (congestive) heart failure: Secondary | ICD-10-CM | POA: Diagnosis not present

## 2019-09-28 DIAGNOSIS — I69351 Hemiplegia and hemiparesis following cerebral infarction affecting right dominant side: Secondary | ICD-10-CM | POA: Diagnosis not present

## 2019-09-28 DIAGNOSIS — Z7902 Long term (current) use of antithrombotics/antiplatelets: Secondary | ICD-10-CM | POA: Diagnosis not present

## 2019-09-28 DIAGNOSIS — H3530 Unspecified macular degeneration: Secondary | ICD-10-CM | POA: Diagnosis not present

## 2019-09-28 DIAGNOSIS — Z87440 Personal history of urinary (tract) infections: Secondary | ICD-10-CM | POA: Diagnosis not present

## 2019-09-28 DIAGNOSIS — Z7901 Long term (current) use of anticoagulants: Secondary | ICD-10-CM | POA: Diagnosis not present

## 2019-09-28 DIAGNOSIS — I480 Paroxysmal atrial fibrillation: Secondary | ICD-10-CM | POA: Diagnosis not present

## 2019-09-28 DIAGNOSIS — R1312 Dysphagia, oropharyngeal phase: Secondary | ICD-10-CM | POA: Diagnosis not present

## 2019-09-28 DIAGNOSIS — I255 Ischemic cardiomyopathy: Secondary | ICD-10-CM | POA: Diagnosis not present

## 2019-09-28 DIAGNOSIS — I69391 Dysphagia following cerebral infarction: Secondary | ICD-10-CM | POA: Diagnosis not present

## 2019-09-28 DIAGNOSIS — Z9181 History of falling: Secondary | ICD-10-CM | POA: Diagnosis not present

## 2019-09-28 DIAGNOSIS — E876 Hypokalemia: Secondary | ICD-10-CM | POA: Diagnosis not present

## 2019-09-28 DIAGNOSIS — I110 Hypertensive heart disease with heart failure: Secondary | ICD-10-CM | POA: Diagnosis not present

## 2019-09-28 DIAGNOSIS — I69320 Aphasia following cerebral infarction: Secondary | ICD-10-CM | POA: Diagnosis not present

## 2019-09-28 DIAGNOSIS — I5043 Acute on chronic combined systolic (congestive) and diastolic (congestive) heart failure: Secondary | ICD-10-CM | POA: Diagnosis not present

## 2019-09-28 DIAGNOSIS — H548 Legal blindness, as defined in USA: Secondary | ICD-10-CM | POA: Diagnosis not present

## 2019-09-28 DIAGNOSIS — I25119 Atherosclerotic heart disease of native coronary artery with unspecified angina pectoris: Secondary | ICD-10-CM | POA: Diagnosis not present

## 2019-09-28 DIAGNOSIS — I69322 Dysarthria following cerebral infarction: Secondary | ICD-10-CM | POA: Diagnosis not present

## 2019-09-28 DIAGNOSIS — Z85038 Personal history of other malignant neoplasm of large intestine: Secondary | ICD-10-CM | POA: Diagnosis not present

## 2019-10-03 DIAGNOSIS — I69320 Aphasia following cerebral infarction: Secondary | ICD-10-CM | POA: Diagnosis not present

## 2019-10-03 DIAGNOSIS — Z9181 History of falling: Secondary | ICD-10-CM | POA: Diagnosis not present

## 2019-10-03 DIAGNOSIS — I69322 Dysarthria following cerebral infarction: Secondary | ICD-10-CM | POA: Diagnosis not present

## 2019-10-03 DIAGNOSIS — I5043 Acute on chronic combined systolic (congestive) and diastolic (congestive) heart failure: Secondary | ICD-10-CM | POA: Diagnosis not present

## 2019-10-03 DIAGNOSIS — Z87440 Personal history of urinary (tract) infections: Secondary | ICD-10-CM | POA: Diagnosis not present

## 2019-10-03 DIAGNOSIS — R1312 Dysphagia, oropharyngeal phase: Secondary | ICD-10-CM | POA: Diagnosis not present

## 2019-10-03 DIAGNOSIS — H3530 Unspecified macular degeneration: Secondary | ICD-10-CM | POA: Diagnosis not present

## 2019-10-03 DIAGNOSIS — H548 Legal blindness, as defined in USA: Secondary | ICD-10-CM | POA: Diagnosis not present

## 2019-10-03 DIAGNOSIS — I25119 Atherosclerotic heart disease of native coronary artery with unspecified angina pectoris: Secondary | ICD-10-CM | POA: Diagnosis not present

## 2019-10-03 DIAGNOSIS — Z7902 Long term (current) use of antithrombotics/antiplatelets: Secondary | ICD-10-CM | POA: Diagnosis not present

## 2019-10-03 DIAGNOSIS — I110 Hypertensive heart disease with heart failure: Secondary | ICD-10-CM | POA: Diagnosis not present

## 2019-10-03 DIAGNOSIS — I255 Ischemic cardiomyopathy: Secondary | ICD-10-CM | POA: Diagnosis not present

## 2019-10-03 DIAGNOSIS — I480 Paroxysmal atrial fibrillation: Secondary | ICD-10-CM | POA: Diagnosis not present

## 2019-10-03 DIAGNOSIS — I69351 Hemiplegia and hemiparesis following cerebral infarction affecting right dominant side: Secondary | ICD-10-CM | POA: Diagnosis not present

## 2019-10-03 DIAGNOSIS — I69391 Dysphagia following cerebral infarction: Secondary | ICD-10-CM | POA: Diagnosis not present

## 2019-10-03 DIAGNOSIS — E876 Hypokalemia: Secondary | ICD-10-CM | POA: Diagnosis not present

## 2019-10-03 DIAGNOSIS — Z7901 Long term (current) use of anticoagulants: Secondary | ICD-10-CM | POA: Diagnosis not present

## 2019-10-03 DIAGNOSIS — Z85038 Personal history of other malignant neoplasm of large intestine: Secondary | ICD-10-CM | POA: Diagnosis not present

## 2019-10-04 ENCOUNTER — Encounter: Payer: Self-pay | Admitting: Internal Medicine

## 2019-10-04 ENCOUNTER — Ambulatory Visit (INDEPENDENT_AMBULATORY_CARE_PROVIDER_SITE_OTHER): Payer: Medicare Other

## 2019-10-04 ENCOUNTER — Ambulatory Visit (INDEPENDENT_AMBULATORY_CARE_PROVIDER_SITE_OTHER): Payer: Medicare Other | Admitting: Internal Medicine

## 2019-10-04 ENCOUNTER — Other Ambulatory Visit: Payer: Self-pay

## 2019-10-04 VITALS — BP 118/76 | HR 82 | Temp 98.1°F | Ht 72.0 in

## 2019-10-04 DIAGNOSIS — R109 Unspecified abdominal pain: Secondary | ICD-10-CM | POA: Diagnosis not present

## 2019-10-04 DIAGNOSIS — I10 Essential (primary) hypertension: Secondary | ICD-10-CM

## 2019-10-04 DIAGNOSIS — Z85038 Personal history of other malignant neoplasm of large intestine: Secondary | ICD-10-CM | POA: Diagnosis not present

## 2019-10-04 DIAGNOSIS — Z7902 Long term (current) use of antithrombotics/antiplatelets: Secondary | ICD-10-CM | POA: Diagnosis not present

## 2019-10-04 DIAGNOSIS — I255 Ischemic cardiomyopathy: Secondary | ICD-10-CM | POA: Diagnosis not present

## 2019-10-04 DIAGNOSIS — I25119 Atherosclerotic heart disease of native coronary artery with unspecified angina pectoris: Secondary | ICD-10-CM | POA: Diagnosis not present

## 2019-10-04 DIAGNOSIS — R1312 Dysphagia, oropharyngeal phase: Secondary | ICD-10-CM | POA: Diagnosis not present

## 2019-10-04 DIAGNOSIS — I480 Paroxysmal atrial fibrillation: Secondary | ICD-10-CM | POA: Diagnosis not present

## 2019-10-04 DIAGNOSIS — Z9181 History of falling: Secondary | ICD-10-CM | POA: Diagnosis not present

## 2019-10-04 DIAGNOSIS — H3530 Unspecified macular degeneration: Secondary | ICD-10-CM | POA: Diagnosis not present

## 2019-10-04 DIAGNOSIS — Z7901 Long term (current) use of anticoagulants: Secondary | ICD-10-CM | POA: Diagnosis not present

## 2019-10-04 DIAGNOSIS — I69320 Aphasia following cerebral infarction: Secondary | ICD-10-CM | POA: Diagnosis not present

## 2019-10-04 DIAGNOSIS — I69322 Dysarthria following cerebral infarction: Secondary | ICD-10-CM | POA: Diagnosis not present

## 2019-10-04 DIAGNOSIS — I5043 Acute on chronic combined systolic (congestive) and diastolic (congestive) heart failure: Secondary | ICD-10-CM | POA: Diagnosis not present

## 2019-10-04 DIAGNOSIS — I5022 Chronic systolic (congestive) heart failure: Secondary | ICD-10-CM | POA: Diagnosis not present

## 2019-10-04 DIAGNOSIS — Z87440 Personal history of urinary (tract) infections: Secondary | ICD-10-CM | POA: Diagnosis not present

## 2019-10-04 DIAGNOSIS — I69391 Dysphagia following cerebral infarction: Secondary | ICD-10-CM | POA: Diagnosis not present

## 2019-10-04 DIAGNOSIS — E876 Hypokalemia: Secondary | ICD-10-CM | POA: Diagnosis not present

## 2019-10-04 DIAGNOSIS — I110 Hypertensive heart disease with heart failure: Secondary | ICD-10-CM | POA: Diagnosis not present

## 2019-10-04 DIAGNOSIS — H548 Legal blindness, as defined in USA: Secondary | ICD-10-CM | POA: Diagnosis not present

## 2019-10-04 DIAGNOSIS — I69351 Hemiplegia and hemiparesis following cerebral infarction affecting right dominant side: Secondary | ICD-10-CM | POA: Diagnosis not present

## 2019-10-04 LAB — COMPREHENSIVE METABOLIC PANEL
AG Ratio: 1.4 (calc) (ref 1.0–2.5)
ALT: 11 U/L (ref 9–46)
AST: 16 U/L (ref 10–35)
Albumin: 3.9 g/dL (ref 3.6–5.1)
Alkaline phosphatase (APISO): 135 U/L (ref 35–144)
BUN: 8 mg/dL (ref 7–25)
CO2: 23 mmol/L (ref 20–32)
Calcium: 10 mg/dL (ref 8.6–10.3)
Chloride: 102 mmol/L (ref 98–110)
Creat: 0.86 mg/dL (ref 0.70–1.18)
Globulin: 2.7 g/dL (calc) (ref 1.9–3.7)
Glucose, Bld: 110 mg/dL — ABNORMAL HIGH (ref 65–99)
Potassium: 4.9 mmol/L (ref 3.5–5.3)
Sodium: 137 mmol/L (ref 135–146)
Total Bilirubin: 1.7 mg/dL — ABNORMAL HIGH (ref 0.2–1.2)
Total Protein: 6.6 g/dL (ref 6.1–8.1)

## 2019-10-04 LAB — CBC
HCT: 39.8 % (ref 38.5–50.0)
Hemoglobin: 13.1 g/dL — ABNORMAL LOW (ref 13.2–17.1)
MCH: 30.1 pg (ref 27.0–33.0)
MCHC: 32.9 g/dL (ref 32.0–36.0)
MCV: 91.5 fL (ref 80.0–100.0)
MPV: 9.8 fL (ref 7.5–12.5)
Platelets: 313 10*3/uL (ref 140–400)
RBC: 4.35 10*6/uL (ref 4.20–5.80)
RDW: 13.8 % (ref 11.0–15.0)
WBC: 6.5 10*3/uL (ref 3.8–10.8)

## 2019-10-04 LAB — LIPASE: Lipase: 23 U/L (ref 7–60)

## 2019-10-04 LAB — VITAMIN B12: Vitamin B-12: >2000 pg/mL — ABNORMAL HIGH (ref 200–1100)

## 2019-10-04 LAB — VITAMIN D 25 HYDROXY (VIT D DEFICIENCY, FRACTURES): Vit D, 25-Hydroxy: 33 ng/mL (ref 30–100)

## 2019-10-04 NOTE — Progress Notes (Signed)
   Subjective:   Patient ID: Mark Clayton, male    DOB: Dec 06, 1940, 79 y.o.   MRN: [id number]  HPI The patient is a 79 YO man coming in for concerns about abdominal discomfort (in the lower abdomen on the left side, is working with PT a lot now, had episode of constipation about 1-2 weeks ago, this is clear now, he is taking otc agents to help with regularity as needed, denies blood in stool, denies weight loss, appetite is good), and blood pressure (doing well still on coreg 6.25 mg BID and imdur and lasix and losartan, weight stable, denies chest pains or headaches, no dizziness or syncope), and heart failure (monitoring weights closely, stable, taking lasix as prescribed, denies missing medications, is cautious about sodium intake as well).   Review of Systems  Constitutional: Negative.   HENT: Negative.   Eyes: Negative.   Respiratory: Negative for cough, chest tightness and shortness of breath.   Cardiovascular: Negative for chest pain, palpitations and leg swelling.  Gastrointestinal: Negative for abdominal distention, abdominal pain, constipation, diarrhea, nausea and vomiting.  Musculoskeletal: Negative.   Skin: Negative.   Neurological: Positive for weakness and numbness.  Psychiatric/Behavioral: Negative.     Objective:  Physical Exam Constitutional:      Appearance: He is well-developed.     Comments: thin  HENT:     Head: Normocephalic and atraumatic.  Cardiovascular:     Rate and Rhythm: Normal rate and regular rhythm.  Pulmonary:     Effort: Pulmonary effort is normal. No respiratory distress.     Breath sounds: Normal breath sounds. No wheezing or rales.  Abdominal:     General: Bowel sounds are normal. There is no distension.     Palpations: Abdomen is soft.     Tenderness: There is no abdominal tenderness. There is no rebound.  Musculoskeletal:     Cervical back: Normal range of motion.  Skin:    General: Skin is warm and dry.  Neurological:     Mental  Status: He is alert and oriented to person, place, and time.     Coordination: Coordination normal.     Comments: In wheelchair but able to stand and more stable than prior     Vitals:   10/04/19 1014  BP: 118/76  Pulse: 82  Temp: 98.1 F (36.7 C)  TempSrc: Oral  SpO2: 98%  Height: 6' (1.829 m)    This visit occurred during the SARS-CoV-2 public health emergency.  Safety protocols were in place, including screening questions prior to the visit, additional usage of staff PPE, and extensive cleaning of exam room while observing appropriate contact time as indicated for disinfecting solutions.   Assessment & Plan:

## 2019-10-04 NOTE — Patient Instructions (Signed)
We will check the labs today and the x-ray of the stomach.

## 2019-10-05 DIAGNOSIS — Z7902 Long term (current) use of antithrombotics/antiplatelets: Secondary | ICD-10-CM | POA: Diagnosis not present

## 2019-10-05 DIAGNOSIS — I69351 Hemiplegia and hemiparesis following cerebral infarction affecting right dominant side: Secondary | ICD-10-CM | POA: Diagnosis not present

## 2019-10-05 DIAGNOSIS — I69391 Dysphagia following cerebral infarction: Secondary | ICD-10-CM | POA: Diagnosis not present

## 2019-10-05 DIAGNOSIS — Z85038 Personal history of other malignant neoplasm of large intestine: Secondary | ICD-10-CM | POA: Diagnosis not present

## 2019-10-05 DIAGNOSIS — H548 Legal blindness, as defined in USA: Secondary | ICD-10-CM | POA: Diagnosis not present

## 2019-10-05 DIAGNOSIS — H3530 Unspecified macular degeneration: Secondary | ICD-10-CM | POA: Diagnosis not present

## 2019-10-05 DIAGNOSIS — R1312 Dysphagia, oropharyngeal phase: Secondary | ICD-10-CM | POA: Diagnosis not present

## 2019-10-05 DIAGNOSIS — I110 Hypertensive heart disease with heart failure: Secondary | ICD-10-CM | POA: Diagnosis not present

## 2019-10-05 DIAGNOSIS — I25119 Atherosclerotic heart disease of native coronary artery with unspecified angina pectoris: Secondary | ICD-10-CM | POA: Diagnosis not present

## 2019-10-05 DIAGNOSIS — I69322 Dysarthria following cerebral infarction: Secondary | ICD-10-CM | POA: Diagnosis not present

## 2019-10-05 DIAGNOSIS — I69320 Aphasia following cerebral infarction: Secondary | ICD-10-CM | POA: Diagnosis not present

## 2019-10-05 DIAGNOSIS — I5043 Acute on chronic combined systolic (congestive) and diastolic (congestive) heart failure: Secondary | ICD-10-CM | POA: Diagnosis not present

## 2019-10-05 DIAGNOSIS — Z7901 Long term (current) use of anticoagulants: Secondary | ICD-10-CM | POA: Diagnosis not present

## 2019-10-05 DIAGNOSIS — Z9181 History of falling: Secondary | ICD-10-CM | POA: Diagnosis not present

## 2019-10-05 DIAGNOSIS — I255 Ischemic cardiomyopathy: Secondary | ICD-10-CM | POA: Diagnosis not present

## 2019-10-05 DIAGNOSIS — I480 Paroxysmal atrial fibrillation: Secondary | ICD-10-CM | POA: Diagnosis not present

## 2019-10-05 DIAGNOSIS — E876 Hypokalemia: Secondary | ICD-10-CM | POA: Diagnosis not present

## 2019-10-05 DIAGNOSIS — Z87440 Personal history of urinary (tract) infections: Secondary | ICD-10-CM | POA: Diagnosis not present

## 2019-10-06 NOTE — Assessment & Plan Note (Signed)
Taking ARB and imdur and lasix. Appears to be normal volume on exam today. Breathing is good and functional status is improving with PT. As he gains back some weight and muscle will need to increase his dry weight.

## 2019-10-06 NOTE — Assessment & Plan Note (Signed)
Checking CMP and CBC to ensure no complications from medication changes. Taking coreg, imdur, losartan, lasix.

## 2019-10-06 NOTE — Assessment & Plan Note (Signed)
With new abdominal discomfort needs abdomen x-ray today. Is up to date on screening with colonoscopy. Could be muscular in nature given ongoing intensive PT.

## 2019-10-09 DIAGNOSIS — I69322 Dysarthria following cerebral infarction: Secondary | ICD-10-CM | POA: Diagnosis not present

## 2019-10-09 DIAGNOSIS — H3530 Unspecified macular degeneration: Secondary | ICD-10-CM | POA: Diagnosis not present

## 2019-10-09 DIAGNOSIS — E876 Hypokalemia: Secondary | ICD-10-CM | POA: Diagnosis not present

## 2019-10-09 DIAGNOSIS — Z7901 Long term (current) use of anticoagulants: Secondary | ICD-10-CM | POA: Diagnosis not present

## 2019-10-09 DIAGNOSIS — R1312 Dysphagia, oropharyngeal phase: Secondary | ICD-10-CM | POA: Diagnosis not present

## 2019-10-09 DIAGNOSIS — H548 Legal blindness, as defined in USA: Secondary | ICD-10-CM | POA: Diagnosis not present

## 2019-10-09 DIAGNOSIS — Z9181 History of falling: Secondary | ICD-10-CM | POA: Diagnosis not present

## 2019-10-09 DIAGNOSIS — I69391 Dysphagia following cerebral infarction: Secondary | ICD-10-CM | POA: Diagnosis not present

## 2019-10-09 DIAGNOSIS — I25119 Atherosclerotic heart disease of native coronary artery with unspecified angina pectoris: Secondary | ICD-10-CM | POA: Diagnosis not present

## 2019-10-09 DIAGNOSIS — Z7902 Long term (current) use of antithrombotics/antiplatelets: Secondary | ICD-10-CM | POA: Diagnosis not present

## 2019-10-09 DIAGNOSIS — Z85038 Personal history of other malignant neoplasm of large intestine: Secondary | ICD-10-CM | POA: Diagnosis not present

## 2019-10-09 DIAGNOSIS — I69351 Hemiplegia and hemiparesis following cerebral infarction affecting right dominant side: Secondary | ICD-10-CM | POA: Diagnosis not present

## 2019-10-09 DIAGNOSIS — I480 Paroxysmal atrial fibrillation: Secondary | ICD-10-CM | POA: Diagnosis not present

## 2019-10-09 DIAGNOSIS — I5043 Acute on chronic combined systolic (congestive) and diastolic (congestive) heart failure: Secondary | ICD-10-CM | POA: Diagnosis not present

## 2019-10-09 DIAGNOSIS — Z87440 Personal history of urinary (tract) infections: Secondary | ICD-10-CM | POA: Diagnosis not present

## 2019-10-09 DIAGNOSIS — I255 Ischemic cardiomyopathy: Secondary | ICD-10-CM | POA: Diagnosis not present

## 2019-10-09 DIAGNOSIS — I110 Hypertensive heart disease with heart failure: Secondary | ICD-10-CM | POA: Diagnosis not present

## 2019-10-09 DIAGNOSIS — I69320 Aphasia following cerebral infarction: Secondary | ICD-10-CM | POA: Diagnosis not present

## 2019-10-10 ENCOUNTER — Ambulatory Visit (INDEPENDENT_AMBULATORY_CARE_PROVIDER_SITE_OTHER): Payer: Medicare Other

## 2019-10-10 DIAGNOSIS — H548 Legal blindness, as defined in USA: Secondary | ICD-10-CM | POA: Diagnosis not present

## 2019-10-10 DIAGNOSIS — I25119 Atherosclerotic heart disease of native coronary artery with unspecified angina pectoris: Secondary | ICD-10-CM | POA: Diagnosis not present

## 2019-10-10 DIAGNOSIS — Z95810 Presence of automatic (implantable) cardiac defibrillator: Secondary | ICD-10-CM

## 2019-10-10 DIAGNOSIS — Z87440 Personal history of urinary (tract) infections: Secondary | ICD-10-CM | POA: Diagnosis not present

## 2019-10-10 DIAGNOSIS — I480 Paroxysmal atrial fibrillation: Secondary | ICD-10-CM | POA: Diagnosis not present

## 2019-10-10 DIAGNOSIS — Z7902 Long term (current) use of antithrombotics/antiplatelets: Secondary | ICD-10-CM | POA: Diagnosis not present

## 2019-10-10 DIAGNOSIS — Z85038 Personal history of other malignant neoplasm of large intestine: Secondary | ICD-10-CM | POA: Diagnosis not present

## 2019-10-10 DIAGNOSIS — I255 Ischemic cardiomyopathy: Secondary | ICD-10-CM | POA: Diagnosis not present

## 2019-10-10 DIAGNOSIS — I69351 Hemiplegia and hemiparesis following cerebral infarction affecting right dominant side: Secondary | ICD-10-CM | POA: Diagnosis not present

## 2019-10-10 DIAGNOSIS — I69391 Dysphagia following cerebral infarction: Secondary | ICD-10-CM | POA: Diagnosis not present

## 2019-10-10 DIAGNOSIS — H3530 Unspecified macular degeneration: Secondary | ICD-10-CM | POA: Diagnosis not present

## 2019-10-10 DIAGNOSIS — Z9181 History of falling: Secondary | ICD-10-CM | POA: Diagnosis not present

## 2019-10-10 DIAGNOSIS — I69320 Aphasia following cerebral infarction: Secondary | ICD-10-CM | POA: Diagnosis not present

## 2019-10-10 DIAGNOSIS — I110 Hypertensive heart disease with heart failure: Secondary | ICD-10-CM | POA: Diagnosis not present

## 2019-10-10 DIAGNOSIS — E876 Hypokalemia: Secondary | ICD-10-CM | POA: Diagnosis not present

## 2019-10-10 DIAGNOSIS — I5022 Chronic systolic (congestive) heart failure: Secondary | ICD-10-CM

## 2019-10-10 DIAGNOSIS — I5043 Acute on chronic combined systolic (congestive) and diastolic (congestive) heart failure: Secondary | ICD-10-CM | POA: Diagnosis not present

## 2019-10-10 DIAGNOSIS — R1312 Dysphagia, oropharyngeal phase: Secondary | ICD-10-CM | POA: Diagnosis not present

## 2019-10-10 DIAGNOSIS — I69322 Dysarthria following cerebral infarction: Secondary | ICD-10-CM | POA: Diagnosis not present

## 2019-10-10 DIAGNOSIS — Z7901 Long term (current) use of anticoagulants: Secondary | ICD-10-CM | POA: Diagnosis not present

## 2019-10-12 DIAGNOSIS — I25119 Atherosclerotic heart disease of native coronary artery with unspecified angina pectoris: Secondary | ICD-10-CM | POA: Diagnosis not present

## 2019-10-12 DIAGNOSIS — I5043 Acute on chronic combined systolic (congestive) and diastolic (congestive) heart failure: Secondary | ICD-10-CM | POA: Diagnosis not present

## 2019-10-12 DIAGNOSIS — E876 Hypokalemia: Secondary | ICD-10-CM | POA: Diagnosis not present

## 2019-10-12 DIAGNOSIS — Z7902 Long term (current) use of antithrombotics/antiplatelets: Secondary | ICD-10-CM | POA: Diagnosis not present

## 2019-10-12 DIAGNOSIS — H548 Legal blindness, as defined in USA: Secondary | ICD-10-CM | POA: Diagnosis not present

## 2019-10-12 DIAGNOSIS — Z87440 Personal history of urinary (tract) infections: Secondary | ICD-10-CM | POA: Diagnosis not present

## 2019-10-12 DIAGNOSIS — I110 Hypertensive heart disease with heart failure: Secondary | ICD-10-CM | POA: Diagnosis not present

## 2019-10-12 DIAGNOSIS — R1312 Dysphagia, oropharyngeal phase: Secondary | ICD-10-CM | POA: Diagnosis not present

## 2019-10-12 DIAGNOSIS — Z9181 History of falling: Secondary | ICD-10-CM | POA: Diagnosis not present

## 2019-10-12 DIAGNOSIS — I255 Ischemic cardiomyopathy: Secondary | ICD-10-CM | POA: Diagnosis not present

## 2019-10-12 DIAGNOSIS — Z7901 Long term (current) use of anticoagulants: Secondary | ICD-10-CM | POA: Diagnosis not present

## 2019-10-12 DIAGNOSIS — I69320 Aphasia following cerebral infarction: Secondary | ICD-10-CM | POA: Diagnosis not present

## 2019-10-12 DIAGNOSIS — I69391 Dysphagia following cerebral infarction: Secondary | ICD-10-CM | POA: Diagnosis not present

## 2019-10-12 DIAGNOSIS — Z85038 Personal history of other malignant neoplasm of large intestine: Secondary | ICD-10-CM | POA: Diagnosis not present

## 2019-10-12 DIAGNOSIS — I69351 Hemiplegia and hemiparesis following cerebral infarction affecting right dominant side: Secondary | ICD-10-CM | POA: Diagnosis not present

## 2019-10-12 DIAGNOSIS — I480 Paroxysmal atrial fibrillation: Secondary | ICD-10-CM | POA: Diagnosis not present

## 2019-10-12 DIAGNOSIS — H3530 Unspecified macular degeneration: Secondary | ICD-10-CM | POA: Diagnosis not present

## 2019-10-12 DIAGNOSIS — I69322 Dysarthria following cerebral infarction: Secondary | ICD-10-CM | POA: Diagnosis not present

## 2019-10-13 NOTE — Progress Notes (Signed)
EPIC Encounter for ICM Monitoring  Patient Name: Mark Clayton is a 79 y.o. male Date: 10/13/2019 Primary Care Physican: [name], MD Cardiologist:End Electrophysiologist: Caryl Comes 6/1/2021Weight: 143-144lbs  Time in AF:24.0 hr/day (100.0%)(taking Xarelto)   Spoke with patient and he is doing fine.  He said he has been urinating a lot and getting rid of fluid.   He has been drinking in excess of 64 oz daily.  He is walking with braces and still trying to get his strength back from stroke.   OptivolThoracic impedance normal.  Prescribed:  Furosemide 40 mg 1 tablet daily.   Potassium 10 mEq take 1 tablet daily  Labs: 10/04/2019 Creatinine 0.86, BUN 8,   Potassium 4.9, Sodium 137,  07/27/2019 Creatinine1.17, BUN9,   Potassium4.9, [id number] 07/03/2019 Creatinine1.14, BUN8,   Potassium4.0, Sodium131, GFR>60  A complete set of results can be found in Results Review.  Recommendations: Recommendation to limit salt intake to 2000 mg daily and fluid intake to 64 oz daily.  Encouraged to call if experiencing any fluid symptoms.   Follow-up plan: ICM clinic phone appointment on8/24/2021. 91 day device clinic remote transmission7/28/2021.  EP/Cardiology Office Visits: 11/13/2019 with Dr. Caryl Comes.    Copy of ICM check sent to Dr. Caryl Comes.   3 month ICM trend: 10/10/2019    1 Year ICM trend:       Rosalene Billings, RN 10/13/2019 11:16 AM

## 2019-10-16 ENCOUNTER — Ambulatory Visit: Payer: Medicare Other | Admitting: Pharmacist

## 2019-10-16 ENCOUNTER — Other Ambulatory Visit: Payer: Self-pay

## 2019-10-16 DIAGNOSIS — I5022 Chronic systolic (congestive) heart failure: Secondary | ICD-10-CM

## 2019-10-16 DIAGNOSIS — I480 Paroxysmal atrial fibrillation: Secondary | ICD-10-CM | POA: Diagnosis not present

## 2019-10-16 DIAGNOSIS — Z87440 Personal history of urinary (tract) infections: Secondary | ICD-10-CM | POA: Diagnosis not present

## 2019-10-16 DIAGNOSIS — I69320 Aphasia following cerebral infarction: Secondary | ICD-10-CM | POA: Diagnosis not present

## 2019-10-16 DIAGNOSIS — I69351 Hemiplegia and hemiparesis following cerebral infarction affecting right dominant side: Secondary | ICD-10-CM | POA: Diagnosis not present

## 2019-10-16 DIAGNOSIS — I69391 Dysphagia following cerebral infarction: Secondary | ICD-10-CM | POA: Diagnosis not present

## 2019-10-16 DIAGNOSIS — H3530 Unspecified macular degeneration: Secondary | ICD-10-CM | POA: Diagnosis not present

## 2019-10-16 DIAGNOSIS — Z7901 Long term (current) use of anticoagulants: Secondary | ICD-10-CM | POA: Diagnosis not present

## 2019-10-16 DIAGNOSIS — Z7902 Long term (current) use of antithrombotics/antiplatelets: Secondary | ICD-10-CM | POA: Diagnosis not present

## 2019-10-16 DIAGNOSIS — I25119 Atherosclerotic heart disease of native coronary artery with unspecified angina pectoris: Secondary | ICD-10-CM

## 2019-10-16 DIAGNOSIS — I255 Ischemic cardiomyopathy: Secondary | ICD-10-CM | POA: Diagnosis not present

## 2019-10-16 DIAGNOSIS — I69322 Dysarthria following cerebral infarction: Secondary | ICD-10-CM | POA: Diagnosis not present

## 2019-10-16 DIAGNOSIS — H548 Legal blindness, as defined in USA: Secondary | ICD-10-CM | POA: Diagnosis not present

## 2019-10-16 DIAGNOSIS — E785 Hyperlipidemia, unspecified: Secondary | ICD-10-CM

## 2019-10-16 DIAGNOSIS — Z85038 Personal history of other malignant neoplasm of large intestine: Secondary | ICD-10-CM | POA: Diagnosis not present

## 2019-10-16 DIAGNOSIS — Z9181 History of falling: Secondary | ICD-10-CM | POA: Diagnosis not present

## 2019-10-16 DIAGNOSIS — E876 Hypokalemia: Secondary | ICD-10-CM | POA: Diagnosis not present

## 2019-10-16 DIAGNOSIS — I5043 Acute on chronic combined systolic (congestive) and diastolic (congestive) heart failure: Secondary | ICD-10-CM | POA: Diagnosis not present

## 2019-10-16 DIAGNOSIS — I10 Essential (primary) hypertension: Secondary | ICD-10-CM

## 2019-10-16 DIAGNOSIS — I4819 Other persistent atrial fibrillation: Secondary | ICD-10-CM

## 2019-10-16 DIAGNOSIS — R1312 Dysphagia, oropharyngeal phase: Secondary | ICD-10-CM | POA: Diagnosis not present

## 2019-10-16 DIAGNOSIS — I110 Hypertensive heart disease with heart failure: Secondary | ICD-10-CM | POA: Diagnosis not present

## 2019-10-16 NOTE — Patient Instructions (Addendum)
Visit Information  Phone number for Pharmacist: [phone]  Goals Addressed            This Visit's Progress   . Pharmacy Care Plan       CARE PLAN ENTRY (see longitudinal plan of care for additional care plan information)  Current Barriers:  . Chronic Disease Management support, education, and care coordination needs related to Hypertension, Hyperlipidemia, Atrial Fibrillation, Heart Failure, and Coronary Artery Disease   Hypertension / Heart Failure BP Readings from Last 3 Encounters:  10/04/19 118/76  08/22/19 (!) 112/58  08/16/19 120/70 .  Pharmacist Clinical Goal(s): o Over the next 120 days, patient will work with PharmD and providers to maintain BP goal <140/90 . Current regimen:  o carvedilol 6.25 mg twice a day o isosorbide MN 30 mg daily,  o furosemide 40 mg daily,  o potassium chloride 10 mEq once daily . Interventions: o Discussed BP goals and benefits of medication for prevention of heart attack / stroke . Patient self care activities - Over the next 120 days, patient will: o Check BP 2-3 times weekly, document, and provide at future appointments o Ensure daily salt intake < 2300 mg/day  Hyperlipidemia / ASCVD Lab Results  Component Value Date/Time   LDLCALC 60 06/07/2019 05:40 AM   LDLCALC 102 (H) 11/16/2018 09:00 AM   LDLDIRECT 104 (H) 03/28/2019 10:18 AM .  Pharmacist Clinical Goal(s): o Over the next 120 days, patient will work with PharmD and providers to achieve LDL goal < 70 . Current regimen:  o atorvastatin 80 mg daily o ezetimibe 10 mg daily,  o clopidogrel 75 mg daily,  o isosorbide MN 30 mg daily,  o nitroglycerin 0.4 mg as needed . Interventions: o Discussed cholesterol goals and benefits of medication for prevention of heart attack / stroke . Patient self care activities - Over the next 120 days, patient will: o Continue current medications o Continue low cholesterol diet  Atrial fibrillation . Pharmacist Clinical Goal(s) o Over  the next 120 days, patient will work with PharmD and providers to optimize therapy . Current regimen:  o carvedilol 6.25 mg twice a day  o Xarelto 20 mg daily . Interventions: o Discussed benefits of Xarelto for stroke prevention o Discussed cost savings programs for Xarelto if cost is too high in coverage gap ("donut hole") . Patient self care activities - Over the next 120 days, patient will: o Continue medication as prescribed o Contact pharmacist if cost issues arise  Medication management . Pharmacist Clinical Goal(s): o Over the next 120 days, patient will work with PharmD and providers to maintain optimal medication adherence . Current pharmacy: [facility] mail order, [facility] . Interventions o Comprehensive medication review performed. o Continue current medication management strategy . Patient self care activities - Over the next 120 days, patient will: o Focus on medication adherence by pill box o Take medications as prescribed o Report any questions or concerns to PharmD and/or provider(s)  Please see past updates related to this goal by clicking on the "Past Updates" button in the selected goal       Patient verbalizes understanding of instructions provided today.   Telephone follow up appointment with pharmacy team member scheduled for: 4 months  Charlene Brooke, PharmD Clinical Pharmacist [facility] at [facility] [phone]  Heart-Healthy Eating Plan Many factors influence your heart (coronary) health, including eating and exercise habits. Coronary risk increases with abnormal blood fat (lipid) levels. Heart-healthy meal planning includes limiting unhealthy fats, increasing  healthy fats, and making other diet and lifestyle changes. What are tips for following this plan? Cooking Cook foods using methods other than frying. Baking, boiling, grilling, and broiling are all good options. Other ways to reduce fat include:  Removing the skin from  poultry.  Removing all visible fats from meats.  Steaming vegetables in water or broth. Meal planning   At meals, imagine dividing your plate into fourths: ? Fill one-half of your plate with vegetables and green salads. ? Fill one-fourth of your plate with whole grains. ? Fill one-fourth of your plate with lean protein foods.  Eat 4-5 servings of vegetables per day. One serving equals 1 cup raw or cooked vegetable, or 2 cups raw leafy greens.  Eat 4-5 servings of fruit per day. One serving equals 1 medium whole fruit,  cup dried fruit,  cup fresh, frozen, or canned fruit, or  cup 100% fruit juice.  Eat more foods that contain soluble fiber. Examples include apples, broccoli, carrots, beans, peas, and barley. Aim to get 25-30 g of fiber per day.  Increase your consumption of legumes, nuts, and seeds to 4-5 servings per week. One serving of dried beans or legumes equals  cup cooked, 1 serving of nuts is  cup, and 1 serving of seeds equals 1 tablespoon. Fats  Choose healthy fats more often. Choose monounsaturated and polyunsaturated fats, such as olive and canola oils, flaxseeds, walnuts, almonds, and seeds.  Eat more omega-3 fats. Choose salmon, mackerel, sardines, tuna, flaxseed oil, and ground flaxseeds. Aim to eat fish at least 2 times each week.  Check food labels carefully to identify foods with trans fats or high amounts of saturated fat.  Limit saturated fats. These are found in animal products, such as meats, butter, and cream. Plant sources of saturated fats include palm oil, palm kernel oil, and coconut oil.  Avoid foods with partially hydrogenated oils in them. These contain trans fats. Examples are stick margarine, some tub margarines, cookies, crackers, and other baked goods.  Avoid fried foods. General information  Eat more home-cooked food and less restaurant, buffet, and fast food.  Limit or avoid alcohol.  Limit foods that are high in starch and  sugar.  Lose weight if you are overweight. Losing just 5-10% of your body weight can help your overall health and prevent diseases such as diabetes and heart disease.  Monitor your salt (sodium) intake, especially if you have high blood pressure. Talk with your health care provider about your sodium intake.  Try to incorporate more vegetarian meals weekly. What foods can I eat? Fruits All fresh, canned (in natural juice), or frozen fruits. Vegetables Fresh or frozen vegetables (raw, steamed, roasted, or grilled). Green salads. Grains Most grains. Choose whole wheat and whole grains most of the time. Rice and pasta, including [place] rice and pastas made with whole wheat. Meats and other proteins Lean, well-trimmed beef, veal, pork, and lamb. Chicken and [place] without skin. All fish and shellfish. Wild duck, rabbit, pheasant, and venison. Egg whites or low-cholesterol egg substitutes. Dried beans, peas, lentils, and tofu. Seeds and most nuts. Dairy Low-fat or nonfat cheeses, including ricotta and mozzarella. Skim or 1% milk (liquid, powdered, or evaporated). Buttermilk made with low-fat milk. Nonfat or low-fat yogurt. Fats and oils Non-hydrogenated (trans-free) margarines. Vegetable oils, including soybean, sesame, sunflower, olive, peanut, safflower, corn, canola, and cottonseed. Salad dressings or mayonnaise made with a vegetable oil. Beverages Water (mineral or sparkling). Coffee and tea. Diet carbonated beverages. Sweets and desserts Sherbet,  gelatin, and fruit ice. Small amounts of dark chocolate. Limit all sweets and desserts. Seasonings and condiments All seasonings and condiments. The items listed above may not be a complete list of foods and beverages you can eat. Contact a dietitian for more options. What foods are not recommended? Fruits Canned fruit in heavy syrup. Fruit in cream or butter sauce. Fried fruit. Limit coconut. Vegetables Vegetables cooked in cheese, cream, or  butter sauce. Fried vegetables. Grains Breads made with saturated or trans fats, oils, or whole milk. Croissants. Sweet rolls. Donuts. High-fat crackers, such as cheese crackers. Meats and other proteins Fatty meats, such as hot dogs, ribs, sausage, bacon, rib-eye roast or steak. High-fat deli meats, such as salami and bologna. Caviar. Domestic duck and goose. Organ meats, such as liver. Dairy Cream, sour cream, cream cheese, and creamed cottage cheese. Whole milk cheeses. Whole or 2% milk (liquid, evaporated, or condensed). Whole buttermilk. Cream sauce or high-fat cheese sauce. Whole-milk yogurt. Fats and oils Meat fat, or shortening. Cocoa butter, hydrogenated oils, palm oil, coconut oil, palm kernel oil. Solid fats and shortenings, including bacon fat, salt pork, lard, and butter. Nondairy cream substitutes. Salad dressings with cheese or sour cream. Beverages Regular sodas and any drinks with added sugar. Sweets and desserts Frosting. Pudding. Cookies. Cakes. Pies. Milk chocolate or white chocolate. Buttered syrups. Full-fat ice cream or ice cream drinks. The items listed above may not be a complete list of foods and beverages to avoid. Contact a dietitian for more information. Summary  Heart-healthy meal planning includes limiting unhealthy fats, increasing healthy fats, and making other diet and lifestyle changes.  Lose weight if you are overweight. Losing just 5-10% of your body weight can help your overall health and prevent diseases such as diabetes and heart disease.  Focus on eating a balance of foods, including fruits and vegetables, low-fat or nonfat dairy, lean protein, nuts and legumes, whole grains, and heart-healthy oils and fats. This information is not intended to replace advice given to you by your health care provider. Make sure you discuss any questions you have with your health care provider. Document Revised: 04/23/2017 Document Reviewed: 04/23/2017 Elsevier Patient  Education  2020 [employer].

## 2019-10-16 NOTE — Chronic Care Management (AMB) (Signed)
Chronic Care Management Pharmacy  Name: Mark Clayton  MRN: [id number] DOB: 1940/06/17  Chief Complaint/ HPI  Mark Clayton,  79 y.o. , male presents for their Follow-Up CCM visit with the clinical pharmacist via telephone due to COVID-19 Pandemic.  PCP : [name], MD  Their chronic conditions include: HTN, CHF, Afib w/ hx of embolic stroke, CAD  Office Visits: 10/04/19 Dr Sharlet Salina OV: abdominal discomfort - Xray no acute findings.  08/16/19 Dr Sharlet Salina OV: syncope sx improved with lower dose carvedilol. No med changes.  07/27/19 Dr Sharlet Salina OV: BP issues. Reduce KCl to 1 pill daily. Check CBC, BMP. Reduced carvedilol to 6.25 mg BID due to syncopal sx  07/14/19 Dr Sharlet Salina VV: hospital f/u for [place]. HH referral for stability/weakness.  07/11/19 Dr Sharlet Salina VV: overall poor function s/p ICH. OT/PT ordered.  Consult Visit: 08/22/19 NP Roderic Palau (Afib clinic): pt-reported "spell", no unusual arrythmia per device interrogation. Pt reassured, no med changes.  08/10/19 Dr Caryl Comes VV (cardiology): Afib f/u, plan to leave in Afib since no sx. No med changes.  07/24/19 Dr Zadie Rhine (ophthalmology) 07/20/19 phys med/rehab. 06/06/19 - 06/14/19 hospital admission: ICH s/p fall. Given [place]. Pt found to have acute L MCA infarct as well. Holding clopidogrel and Xarelto. Stopped dofetilide. 06/14/19 - 07/08/19 rehab admission: ICH s/p fall. Cleared to resume Plavix and Xarelto after repeat CT. Mild cognitive impairment, dysphagia. Needs supervision with activity. 04/14/19 Dr Marlou Porch (cardiology): added Zetia to achieve LDL goal < 70.  03/28/19 Dr Caryl Comes (EP): failed cardioversion. Failed amiodarone due weakness/SOB. on dofetilide since 2019.  Medications: Outpatient Encounter Medications as of 10/16/2019  Medication Sig  . acetaminophen (TYLENOL) 325 MG tablet Take 1-2 tablets (325-650 mg total) by mouth every 4 (four) hours as needed for mild pain.  Marland Kitchen atorvastatin (LIPITOR) 80 MG tablet Take 1  tablet (80 mg total) by mouth daily.  . carvedilol (COREG) 6.25 MG tablet Take 1 tablet (6.25 mg total) by mouth 2 (two) times daily with a meal.  . clopidogrel (PLAVIX) 75 MG tablet Take 1 tablet (75 mg total) by mouth daily.  Marland Kitchen ezetimibe (ZETIA) 10 MG tablet Take 1 tablet (10 mg total) by mouth daily.  . furosemide (LASIX) 40 MG tablet TAKE 1 TABLET BY MOUTH  DAILY  . isosorbide mononitrate (IMDUR) 30 MG 24 hr tablet TAKE 1 TABLET BY MOUTH  DAILY  . losartan (COZAAR) 50 MG tablet TAKE 1 TABLET BY MOUTH  DAILY  . meclizine (ANTIVERT) 12.5 MG tablet Take 1 tablet (12.5 mg total) by mouth 2 (two) times daily as needed for dizziness.  . nitroGLYCERIN (NITROSTAT) 0.4 MG SL tablet   . pantoprazole (PROTONIX) 40 MG tablet TAKE 1 TABLET BY MOUTH  DAILY  . polyethylene glycol (MIRALAX / GLYCOLAX) 17 g packet Take 17 g by mouth daily.  . potassium chloride (KLOR-CON) 10 MEQ tablet Take 1 tablet (10 mEq total) by mouth daily.  . rivaroxaban (XARELTO) 20 MG TABS tablet Take 1 tablet (20 mg total) by mouth daily with supper.  . vitamin B-12 (CYANOCOBALAMIN) 100 MCG tablet Take 100 mcg by mouth daily.    No facility-administered encounter medications on file as of 10/16/2019.     Current Diagnosis/Assessment:  SDOH Interventions     Most Recent Value  SDOH Interventions  Financial Strain Interventions Intervention Not Indicated     Goals Addressed            This Visit's Progress   . Pharmacy Care Plan  CARE PLAN ENTRY (see longitudinal plan of care for additional care plan information)  Current Barriers:  . Chronic Disease Management support, education, and care coordination needs related to Hypertension, Hyperlipidemia, Atrial Fibrillation, Heart Failure, and Coronary Artery Disease   Hypertension / Heart Failure BP Readings from Last 3 Encounters:  10/04/19 118/76  08/22/19 (!) 112/58  08/16/19 120/70 .  Pharmacist Clinical Goal(s): o Over the next 120 days, patient will work  with PharmD and providers to maintain BP goal <140/90 . Current regimen:  o carvedilol 6.25 mg twice a day o isosorbide MN 30 mg daily,  o furosemide 40 mg daily,  o potassium chloride 10 mEq once daily . Interventions: o Discussed BP goals and benefits of medication for prevention of heart attack / stroke . Patient self care activities - Over the next 120 days, patient will: o Check BP 2-3 times weekly, document, and provide at future appointments o Ensure daily salt intake < 2300 mg/day  Hyperlipidemia / ASCVD Lab Results  Component Value Date/Time   LDLCALC 60 06/07/2019 05:40 AM   LDLCALC 102 (H) 11/16/2018 09:00 AM   LDLDIRECT 104 (H) 03/28/2019 10:18 AM .  Pharmacist Clinical Goal(s): o Over the next 120 days, patient will work with PharmD and providers to achieve LDL goal < 70 . Current regimen:  o atorvastatin 80 mg daily o ezetimibe 10 mg daily,  o clopidogrel 75 mg daily,  o isosorbide MN 30 mg daily,  o nitroglycerin 0.4 mg as needed . Interventions: o Discussed cholesterol goals and benefits of medication for prevention of heart attack / stroke . Patient self care activities - Over the next 120 days, patient will: o Continue current medications o Continue low cholesterol diet  Atrial fibrillation . Pharmacist Clinical Goal(s) o Over the next 120 days, patient will work with PharmD and providers to optimize therapy . Current regimen:  o carvedilol 6.25 mg twice a day  o Xarelto 20 mg daily . Interventions: o Discussed benefits of Xarelto for stroke prevention o Discussed cost savings programs for Xarelto if cost is too high in coverage gap ("donut hole") . Patient self care activities - Over the next 120 days, patient will: o Continue medication as prescribed o Contact pharmacist if cost issues arise  Medication management . Pharmacist Clinical Goal(s): o Over the next 120 days, patient will work with PharmD and providers to maintain optimal medication  adherence . Current pharmacy: [facility] mail order, [facility] . Interventions o Comprehensive medication review performed. o Continue current medication management strategy . Patient self care activities - Over the next 120 days, patient will: o Focus on medication adherence by pill box o Take medications as prescribed o Report any questions or concerns to PharmD and/or provider(s)  Please see past updates related to this goal by clicking on the "Past Updates" button in the selected goal        AFIB   Patient is currently rate controlled. Pulse Readings from Last 3 Encounters:  10/04/19 82  08/22/19 74  08/16/19 80   CHA2DS2-VASc Score = 7  The patient's score is based upon: CHF History: 1 HTN History: 1 Age : 2 Diabetes History: 0 Stroke History: 2 Vascular Disease History: 1 Gender: 0   Patient has failed these meds in past: dofetilide Patient is currently controlled on the following medications:   carvedilol 6.25 mg BID,   Xarelto 20 mg daily  We discussed: denies bleeding issues  Plan  Continue current medications  Heart Failure /  Hypertension   Type: Systolic  Last ejection fraction: <20% (06/07/2019) NYHA Class: I (no actitivty limitation) AHA HF Stage: B (Heart disease present - no symptoms present)  Office blood pressures are  BP Readings from Last 3 Encounters:  10/04/19 118/76  08/22/19 (!) 112/58  08/16/19 120/70   Kidney Function Lab Results  Component Value Date/Time   CREATININE 0.86 10/04/2019 10:36 AM   CREATININE 1.17 07/27/2019 03:10 PM   CREATININE 1.14 07/03/2019 05:17 AM   GFR 60.19 07/27/2019 03:10 PM   GFRNONAA >60 07/03/2019 05:17 AM   GFRAA >60 07/03/2019 05:17 AM   K 4.9 10/04/2019 10:36 AM   K 4.9 07/27/2019 03:10 PM   Patient checks BP at home infrequently  Patient home BP readings are ranging: "good"  Patient has failed these meds in past: n/a Patient is currently controlled on the following medications:    carvedilol 6.25 mg BID,   isosorbide MN 30 mg daily,   furosemide 40 mg daily,   potassium chloride 10 meq BID    We discussed: since PCP decreased dose of carvedilol, no further issues with dizziness.   Plan  Continue current medications and control with diet and exercise    Hyperlipidemia / CAD   LDL goal < 70  Lipid Panel     Component Value Date/Time   CHOL 121 06/07/2019 0540   CHOL 182 11/16/2018 0900   TRIG 112 06/07/2019 0540   HDL 39 (L) 06/07/2019 0540   HDL 56 11/16/2018 0900   CHOLHDL 3.1 06/07/2019 0540   VLDL 22 06/07/2019 0540   LDLCALC 60 06/07/2019 0540   LDLCALC 102 (H) 11/16/2018 0900   LDLDIRECT 104 (H) 03/28/2019 1018   LABVLDL 24 11/16/2018 0900   The ASCVD Risk score (Goff DC Jr., et al., 2013) failed to calculate for the following reasons:   The patient has a prior MI or stroke diagnosis   ASCVD: hx of embolic stroke, CABG, PCI 2016  Patient has failed these meds in past: n/a Patient is currently controlled on the following medications:   atorvastatin 80 mg daily  ezetimibe 10 mg daily,   clopidogrel 75 mg daily,   isosorbide MN 30 mg daily,   nitroglycerin 0.4 mg SL prn  We discussed:  Cholesterol goals, pt is now at goal LDL < 70 after starting ezetimibe earlier this year.  Plan  Continue current medications  Medication Management   Pt uses [facility] mail order pharmacy for all medications Uses pill box Pt endorses 100% compliance  We discussed: Pt does not have to go without medicines, mail order pharmacy always delivers on time. Denies issues with medication management. Reports Xarelto is $47/month, currently reasonable. Discussed options if donut hole is reached this year, including Engineer, maintenance ($85/month) or PAP.  Plan  Continue current medication management strategy     Follow up: 4 month phone visit  Charlene Brooke, PharmD Clinical Pharmacist [facility] at [facility] [phone]

## 2019-10-18 DIAGNOSIS — I69322 Dysarthria following cerebral infarction: Secondary | ICD-10-CM | POA: Diagnosis not present

## 2019-10-18 DIAGNOSIS — I69391 Dysphagia following cerebral infarction: Secondary | ICD-10-CM | POA: Diagnosis not present

## 2019-10-18 DIAGNOSIS — Z87440 Personal history of urinary (tract) infections: Secondary | ICD-10-CM | POA: Diagnosis not present

## 2019-10-18 DIAGNOSIS — Z9181 History of falling: Secondary | ICD-10-CM | POA: Diagnosis not present

## 2019-10-18 DIAGNOSIS — E876 Hypokalemia: Secondary | ICD-10-CM | POA: Diagnosis not present

## 2019-10-18 DIAGNOSIS — I69320 Aphasia following cerebral infarction: Secondary | ICD-10-CM | POA: Diagnosis not present

## 2019-10-18 DIAGNOSIS — I5043 Acute on chronic combined systolic (congestive) and diastolic (congestive) heart failure: Secondary | ICD-10-CM | POA: Diagnosis not present

## 2019-10-18 DIAGNOSIS — H3530 Unspecified macular degeneration: Secondary | ICD-10-CM | POA: Diagnosis not present

## 2019-10-18 DIAGNOSIS — I69351 Hemiplegia and hemiparesis following cerebral infarction affecting right dominant side: Secondary | ICD-10-CM | POA: Diagnosis not present

## 2019-10-18 DIAGNOSIS — I480 Paroxysmal atrial fibrillation: Secondary | ICD-10-CM | POA: Diagnosis not present

## 2019-10-18 DIAGNOSIS — Z7901 Long term (current) use of anticoagulants: Secondary | ICD-10-CM | POA: Diagnosis not present

## 2019-10-18 DIAGNOSIS — R1312 Dysphagia, oropharyngeal phase: Secondary | ICD-10-CM | POA: Diagnosis not present

## 2019-10-18 DIAGNOSIS — H548 Legal blindness, as defined in USA: Secondary | ICD-10-CM | POA: Diagnosis not present

## 2019-10-18 DIAGNOSIS — I255 Ischemic cardiomyopathy: Secondary | ICD-10-CM | POA: Diagnosis not present

## 2019-10-18 DIAGNOSIS — Z85038 Personal history of other malignant neoplasm of large intestine: Secondary | ICD-10-CM | POA: Diagnosis not present

## 2019-10-18 DIAGNOSIS — I25119 Atherosclerotic heart disease of native coronary artery with unspecified angina pectoris: Secondary | ICD-10-CM | POA: Diagnosis not present

## 2019-10-18 DIAGNOSIS — Z7902 Long term (current) use of antithrombotics/antiplatelets: Secondary | ICD-10-CM | POA: Diagnosis not present

## 2019-10-18 DIAGNOSIS — I110 Hypertensive heart disease with heart failure: Secondary | ICD-10-CM | POA: Diagnosis not present

## 2019-10-23 DIAGNOSIS — Z7902 Long term (current) use of antithrombotics/antiplatelets: Secondary | ICD-10-CM | POA: Diagnosis not present

## 2019-10-23 DIAGNOSIS — I69391 Dysphagia following cerebral infarction: Secondary | ICD-10-CM | POA: Diagnosis not present

## 2019-10-23 DIAGNOSIS — Z87440 Personal history of urinary (tract) infections: Secondary | ICD-10-CM | POA: Diagnosis not present

## 2019-10-23 DIAGNOSIS — I69322 Dysarthria following cerebral infarction: Secondary | ICD-10-CM | POA: Diagnosis not present

## 2019-10-23 DIAGNOSIS — R1312 Dysphagia, oropharyngeal phase: Secondary | ICD-10-CM | POA: Diagnosis not present

## 2019-10-23 DIAGNOSIS — I25119 Atherosclerotic heart disease of native coronary artery with unspecified angina pectoris: Secondary | ICD-10-CM | POA: Diagnosis not present

## 2019-10-23 DIAGNOSIS — Z85038 Personal history of other malignant neoplasm of large intestine: Secondary | ICD-10-CM | POA: Diagnosis not present

## 2019-10-23 DIAGNOSIS — I69351 Hemiplegia and hemiparesis following cerebral infarction affecting right dominant side: Secondary | ICD-10-CM | POA: Diagnosis not present

## 2019-10-23 DIAGNOSIS — H548 Legal blindness, as defined in USA: Secondary | ICD-10-CM | POA: Diagnosis not present

## 2019-10-23 DIAGNOSIS — I480 Paroxysmal atrial fibrillation: Secondary | ICD-10-CM | POA: Diagnosis not present

## 2019-10-23 DIAGNOSIS — H3530 Unspecified macular degeneration: Secondary | ICD-10-CM | POA: Diagnosis not present

## 2019-10-23 DIAGNOSIS — Z9181 History of falling: Secondary | ICD-10-CM | POA: Diagnosis not present

## 2019-10-23 DIAGNOSIS — I5043 Acute on chronic combined systolic (congestive) and diastolic (congestive) heart failure: Secondary | ICD-10-CM | POA: Diagnosis not present

## 2019-10-23 DIAGNOSIS — E876 Hypokalemia: Secondary | ICD-10-CM | POA: Diagnosis not present

## 2019-10-23 DIAGNOSIS — I255 Ischemic cardiomyopathy: Secondary | ICD-10-CM | POA: Diagnosis not present

## 2019-10-23 DIAGNOSIS — Z7901 Long term (current) use of anticoagulants: Secondary | ICD-10-CM | POA: Diagnosis not present

## 2019-10-23 DIAGNOSIS — I110 Hypertensive heart disease with heart failure: Secondary | ICD-10-CM | POA: Diagnosis not present

## 2019-10-23 DIAGNOSIS — I69320 Aphasia following cerebral infarction: Secondary | ICD-10-CM | POA: Diagnosis not present

## 2019-10-25 ENCOUNTER — Ambulatory Visit (INDEPENDENT_AMBULATORY_CARE_PROVIDER_SITE_OTHER): Payer: Medicare Other | Admitting: *Deleted

## 2019-10-25 DIAGNOSIS — H548 Legal blindness, as defined in USA: Secondary | ICD-10-CM | POA: Diagnosis not present

## 2019-10-25 DIAGNOSIS — I5043 Acute on chronic combined systolic (congestive) and diastolic (congestive) heart failure: Secondary | ICD-10-CM | POA: Diagnosis not present

## 2019-10-25 DIAGNOSIS — I255 Ischemic cardiomyopathy: Secondary | ICD-10-CM

## 2019-10-25 DIAGNOSIS — I69320 Aphasia following cerebral infarction: Secondary | ICD-10-CM | POA: Diagnosis not present

## 2019-10-25 DIAGNOSIS — Z85038 Personal history of other malignant neoplasm of large intestine: Secondary | ICD-10-CM | POA: Diagnosis not present

## 2019-10-25 DIAGNOSIS — I110 Hypertensive heart disease with heart failure: Secondary | ICD-10-CM | POA: Diagnosis not present

## 2019-10-25 DIAGNOSIS — Z9181 History of falling: Secondary | ICD-10-CM | POA: Diagnosis not present

## 2019-10-25 DIAGNOSIS — E876 Hypokalemia: Secondary | ICD-10-CM | POA: Diagnosis not present

## 2019-10-25 DIAGNOSIS — R1312 Dysphagia, oropharyngeal phase: Secondary | ICD-10-CM | POA: Diagnosis not present

## 2019-10-25 DIAGNOSIS — I69391 Dysphagia following cerebral infarction: Secondary | ICD-10-CM | POA: Diagnosis not present

## 2019-10-25 DIAGNOSIS — I69351 Hemiplegia and hemiparesis following cerebral infarction affecting right dominant side: Secondary | ICD-10-CM | POA: Diagnosis not present

## 2019-10-25 DIAGNOSIS — I5022 Chronic systolic (congestive) heart failure: Secondary | ICD-10-CM

## 2019-10-25 DIAGNOSIS — I25119 Atherosclerotic heart disease of native coronary artery with unspecified angina pectoris: Secondary | ICD-10-CM | POA: Diagnosis not present

## 2019-10-25 DIAGNOSIS — Z7901 Long term (current) use of anticoagulants: Secondary | ICD-10-CM | POA: Diagnosis not present

## 2019-10-25 DIAGNOSIS — Z87440 Personal history of urinary (tract) infections: Secondary | ICD-10-CM | POA: Diagnosis not present

## 2019-10-25 DIAGNOSIS — I69322 Dysarthria following cerebral infarction: Secondary | ICD-10-CM | POA: Diagnosis not present

## 2019-10-25 DIAGNOSIS — H3530 Unspecified macular degeneration: Secondary | ICD-10-CM | POA: Diagnosis not present

## 2019-10-25 DIAGNOSIS — Z7902 Long term (current) use of antithrombotics/antiplatelets: Secondary | ICD-10-CM | POA: Diagnosis not present

## 2019-10-25 DIAGNOSIS — I480 Paroxysmal atrial fibrillation: Secondary | ICD-10-CM | POA: Diagnosis not present

## 2019-10-25 LAB — CUP PACEART REMOTE DEVICE CHECK
Battery Remaining Longevity: 129 mo
Battery Voltage: 3.04 V
Brady Statistic RV Percent Paced: 0.32 %
Date Time Interrogation Session: 20210728022603
HighPow Impedance: 46 Ohm
HighPow Impedance: 60 Ohm
Implantable Lead Implant Date: 20041020
Implantable Lead Implant Date: 20100913
Implantable Lead Location: 753860
Implantable Lead Location: 753860
Implantable Lead Model: 5076
Implantable Lead Model: 6949
Implantable Pulse Generator Implant Date: 20200909
Lead Channel Impedance Value: 361 Ohm
Lead Channel Impedance Value: 513 Ohm
Lead Channel Pacing Threshold Amplitude: 1.125 V
Lead Channel Pacing Threshold Pulse Width: 0.4 ms
Lead Channel Sensing Intrinsic Amplitude: 31.625 mV
Lead Channel Sensing Intrinsic Amplitude: 31.625 mV
Lead Channel Setting Pacing Amplitude: 2.5 V
Lead Channel Setting Pacing Pulse Width: 0.8 ms
Lead Channel Setting Sensing Sensitivity: 0.45 mV

## 2019-10-27 NOTE — Progress Notes (Signed)
Remote ICD transmission.   

## 2019-10-30 DIAGNOSIS — H3530 Unspecified macular degeneration: Secondary | ICD-10-CM | POA: Diagnosis not present

## 2019-10-30 DIAGNOSIS — H548 Legal blindness, as defined in USA: Secondary | ICD-10-CM | POA: Diagnosis not present

## 2019-10-30 DIAGNOSIS — E876 Hypokalemia: Secondary | ICD-10-CM | POA: Diagnosis not present

## 2019-10-30 DIAGNOSIS — I69322 Dysarthria following cerebral infarction: Secondary | ICD-10-CM | POA: Diagnosis not present

## 2019-10-30 DIAGNOSIS — Z7901 Long term (current) use of anticoagulants: Secondary | ICD-10-CM | POA: Diagnosis not present

## 2019-10-30 DIAGNOSIS — I25119 Atherosclerotic heart disease of native coronary artery with unspecified angina pectoris: Secondary | ICD-10-CM | POA: Diagnosis not present

## 2019-10-30 DIAGNOSIS — I69351 Hemiplegia and hemiparesis following cerebral infarction affecting right dominant side: Secondary | ICD-10-CM | POA: Diagnosis not present

## 2019-10-30 DIAGNOSIS — I480 Paroxysmal atrial fibrillation: Secondary | ICD-10-CM | POA: Diagnosis not present

## 2019-10-30 DIAGNOSIS — I255 Ischemic cardiomyopathy: Secondary | ICD-10-CM | POA: Diagnosis not present

## 2019-10-30 DIAGNOSIS — I5043 Acute on chronic combined systolic (congestive) and diastolic (congestive) heart failure: Secondary | ICD-10-CM | POA: Diagnosis not present

## 2019-10-30 DIAGNOSIS — Z9181 History of falling: Secondary | ICD-10-CM | POA: Diagnosis not present

## 2019-10-30 DIAGNOSIS — Z85038 Personal history of other malignant neoplasm of large intestine: Secondary | ICD-10-CM | POA: Diagnosis not present

## 2019-10-30 DIAGNOSIS — I69391 Dysphagia following cerebral infarction: Secondary | ICD-10-CM | POA: Diagnosis not present

## 2019-10-30 DIAGNOSIS — Z7902 Long term (current) use of antithrombotics/antiplatelets: Secondary | ICD-10-CM | POA: Diagnosis not present

## 2019-10-30 DIAGNOSIS — Z87440 Personal history of urinary (tract) infections: Secondary | ICD-10-CM | POA: Diagnosis not present

## 2019-10-30 DIAGNOSIS — R1312 Dysphagia, oropharyngeal phase: Secondary | ICD-10-CM | POA: Diagnosis not present

## 2019-10-30 DIAGNOSIS — I110 Hypertensive heart disease with heart failure: Secondary | ICD-10-CM | POA: Diagnosis not present

## 2019-10-30 DIAGNOSIS — I69320 Aphasia following cerebral infarction: Secondary | ICD-10-CM | POA: Diagnosis not present

## 2019-11-03 DIAGNOSIS — I5043 Acute on chronic combined systolic (congestive) and diastolic (congestive) heart failure: Secondary | ICD-10-CM | POA: Diagnosis not present

## 2019-11-03 DIAGNOSIS — Z7901 Long term (current) use of anticoagulants: Secondary | ICD-10-CM | POA: Diagnosis not present

## 2019-11-03 DIAGNOSIS — E876 Hypokalemia: Secondary | ICD-10-CM | POA: Diagnosis not present

## 2019-11-03 DIAGNOSIS — I69391 Dysphagia following cerebral infarction: Secondary | ICD-10-CM | POA: Diagnosis not present

## 2019-11-03 DIAGNOSIS — I25119 Atherosclerotic heart disease of native coronary artery with unspecified angina pectoris: Secondary | ICD-10-CM | POA: Diagnosis not present

## 2019-11-03 DIAGNOSIS — Z85038 Personal history of other malignant neoplasm of large intestine: Secondary | ICD-10-CM | POA: Diagnosis not present

## 2019-11-03 DIAGNOSIS — H3530 Unspecified macular degeneration: Secondary | ICD-10-CM | POA: Diagnosis not present

## 2019-11-03 DIAGNOSIS — H548 Legal blindness, as defined in USA: Secondary | ICD-10-CM | POA: Diagnosis not present

## 2019-11-03 DIAGNOSIS — I110 Hypertensive heart disease with heart failure: Secondary | ICD-10-CM | POA: Diagnosis not present

## 2019-11-03 DIAGNOSIS — I69320 Aphasia following cerebral infarction: Secondary | ICD-10-CM | POA: Diagnosis not present

## 2019-11-03 DIAGNOSIS — I69322 Dysarthria following cerebral infarction: Secondary | ICD-10-CM | POA: Diagnosis not present

## 2019-11-03 DIAGNOSIS — I480 Paroxysmal atrial fibrillation: Secondary | ICD-10-CM | POA: Diagnosis not present

## 2019-11-03 DIAGNOSIS — I255 Ischemic cardiomyopathy: Secondary | ICD-10-CM | POA: Diagnosis not present

## 2019-11-03 DIAGNOSIS — Z9181 History of falling: Secondary | ICD-10-CM | POA: Diagnosis not present

## 2019-11-03 DIAGNOSIS — R1312 Dysphagia, oropharyngeal phase: Secondary | ICD-10-CM | POA: Diagnosis not present

## 2019-11-03 DIAGNOSIS — Z7902 Long term (current) use of antithrombotics/antiplatelets: Secondary | ICD-10-CM | POA: Diagnosis not present

## 2019-11-03 DIAGNOSIS — I69351 Hemiplegia and hemiparesis following cerebral infarction affecting right dominant side: Secondary | ICD-10-CM | POA: Diagnosis not present

## 2019-11-03 DIAGNOSIS — Z87440 Personal history of urinary (tract) infections: Secondary | ICD-10-CM | POA: Diagnosis not present

## 2019-11-06 ENCOUNTER — Telehealth: Payer: Self-pay | Admitting: Internal Medicine

## 2019-11-06 NOTE — Telephone Encounter (Signed)
New message:  Katherine Mantle is calling from [facility] care and states she just wanted to report an accident with the pt last week. She states the pt was trying to go up the ramp in his wheelchair and fell back. Pt did not hit head just a few scraps and bruises. Pt has been educated on when going up the ramp to have assistance. Please advise.

## 2019-11-07 NOTE — Telephone Encounter (Signed)
Noted, no change needed.

## 2019-11-08 ENCOUNTER — Telehealth: Payer: Self-pay | Admitting: Internal Medicine

## 2019-11-08 DIAGNOSIS — I480 Paroxysmal atrial fibrillation: Secondary | ICD-10-CM | POA: Diagnosis not present

## 2019-11-08 DIAGNOSIS — Z7902 Long term (current) use of antithrombotics/antiplatelets: Secondary | ICD-10-CM | POA: Diagnosis not present

## 2019-11-08 DIAGNOSIS — Z87440 Personal history of urinary (tract) infections: Secondary | ICD-10-CM | POA: Diagnosis not present

## 2019-11-08 DIAGNOSIS — I69322 Dysarthria following cerebral infarction: Secondary | ICD-10-CM | POA: Diagnosis not present

## 2019-11-08 DIAGNOSIS — I25119 Atherosclerotic heart disease of native coronary artery with unspecified angina pectoris: Secondary | ICD-10-CM | POA: Diagnosis not present

## 2019-11-08 DIAGNOSIS — I5043 Acute on chronic combined systolic (congestive) and diastolic (congestive) heart failure: Secondary | ICD-10-CM | POA: Diagnosis not present

## 2019-11-08 DIAGNOSIS — Z9181 History of falling: Secondary | ICD-10-CM | POA: Diagnosis not present

## 2019-11-08 DIAGNOSIS — H3530 Unspecified macular degeneration: Secondary | ICD-10-CM | POA: Diagnosis not present

## 2019-11-08 DIAGNOSIS — Z85038 Personal history of other malignant neoplasm of large intestine: Secondary | ICD-10-CM | POA: Diagnosis not present

## 2019-11-08 DIAGNOSIS — Z7901 Long term (current) use of anticoagulants: Secondary | ICD-10-CM | POA: Diagnosis not present

## 2019-11-08 DIAGNOSIS — I255 Ischemic cardiomyopathy: Secondary | ICD-10-CM | POA: Diagnosis not present

## 2019-11-08 DIAGNOSIS — H548 Legal blindness, as defined in USA: Secondary | ICD-10-CM | POA: Diagnosis not present

## 2019-11-08 DIAGNOSIS — I69320 Aphasia following cerebral infarction: Secondary | ICD-10-CM | POA: Diagnosis not present

## 2019-11-08 DIAGNOSIS — R1312 Dysphagia, oropharyngeal phase: Secondary | ICD-10-CM | POA: Diagnosis not present

## 2019-11-08 DIAGNOSIS — I69351 Hemiplegia and hemiparesis following cerebral infarction affecting right dominant side: Secondary | ICD-10-CM | POA: Diagnosis not present

## 2019-11-08 DIAGNOSIS — E876 Hypokalemia: Secondary | ICD-10-CM | POA: Diagnosis not present

## 2019-11-08 DIAGNOSIS — I69391 Dysphagia following cerebral infarction: Secondary | ICD-10-CM | POA: Diagnosis not present

## 2019-11-08 DIAGNOSIS — I110 Hypertensive heart disease with heart failure: Secondary | ICD-10-CM | POA: Diagnosis not present

## 2019-11-08 NOTE — Telephone Encounter (Signed)
   Patient has met home PT goals. Discharged from home health PT as of today. Ready to begin outpatient therapy at [facility]- physical therapist Phone [phone]

## 2019-11-13 ENCOUNTER — Encounter: Payer: Medicare Other | Admitting: Internal Medicine

## 2019-11-15 ENCOUNTER — Other Ambulatory Visit: Payer: Self-pay

## 2019-11-15 MED ORDER — ATORVASTATIN CALCIUM 80 MG PO TABS: 80.0000 mg | ORAL_TABLET | Freq: Every day | ORAL | 0 refills | Status: DC

## 2019-11-15 NOTE — Telephone Encounter (Signed)
01/21/18 patient seen by End  .

## 2019-11-15 NOTE — Addendum Note (Signed)
Addended by: Carter Kitten D on: 11/15/2019 04:20 PM   Modules accepted: Orders

## 2019-11-15 NOTE — Telephone Encounter (Signed)
This is a [place] pt, Dr. End 

## 2019-11-15 NOTE — Telephone Encounter (Signed)
Patient seen Mark Clayton in [address] 07/2019. Patient last seen Mark Bayley, NP 10/2018.  Looks like patient has seen some other from [address].   Does patient come to this office or Is this a [address] patient.

## 2019-11-20 ENCOUNTER — Telehealth: Payer: Self-pay | Admitting: Internal Medicine

## 2019-11-20 NOTE — Telephone Encounter (Signed)
New Message:   Pt is calling in reference to some paper work that was sent on behalf of the patient for him to do outpatient therapy at [facility] in [place]. Pt states when he called two weeks ago he was told the paper work had come from the [employer] but has not been sent. Please advise.

## 2019-11-21 ENCOUNTER — Telehealth: Payer: Self-pay

## 2019-11-21 ENCOUNTER — Ambulatory Visit (INDEPENDENT_AMBULATORY_CARE_PROVIDER_SITE_OTHER): Payer: Medicare Other

## 2019-11-21 DIAGNOSIS — I5022 Chronic systolic (congestive) heart failure: Secondary | ICD-10-CM

## 2019-11-21 DIAGNOSIS — Z95810 Presence of automatic (implantable) cardiac defibrillator: Secondary | ICD-10-CM

## 2019-11-21 DIAGNOSIS — R29898 Other symptoms and signs involving the musculoskeletal system: Secondary | ICD-10-CM

## 2019-11-21 NOTE — Telephone Encounter (Signed)
F/u    The patient calling asking for a call back from the [place] to discuss paperwork outpatient therapy at [facility] in [place].

## 2019-11-21 NOTE — Telephone Encounter (Signed)
Called pt, LVM stated paperwork was not received and asked if they could send it again.

## 2019-11-21 NOTE — Telephone Encounter (Signed)
What is the therapy for?

## 2019-11-21 NOTE — Telephone Encounter (Signed)
I spoke to pt's daughter, Mark Clayton, she stated that paperwork was not needed but a referral is. She stated that a referral for outpatient therapy would need to be entered in order for his therapy to continue and it specifically be sent to [facility] in [place]. Pt's daughter provided a direct contact at the facility listed below.  [place] [phone]  Please advise.

## 2019-11-22 NOTE — Telephone Encounter (Signed)
Referral ordered

## 2019-11-22 NOTE — Telephone Encounter (Signed)
Pt's daughter stated that he needs continued physical therapy for his right arm, leg, and foot.

## 2019-11-22 NOTE — Addendum Note (Signed)
Addended by: Binnie Rail on: 11/22/2019 04:36 PM   Modules accepted: Orders

## 2019-11-24 NOTE — Progress Notes (Signed)
EPIC Encounter for ICM Monitoring  Patient Name: Mark Clayton is a 79 y.o. male Date: 11/24/2019 Primary Care Physican: [name], MD Cardiologist:End Electrophysiologist: Caryl Comes 6/1/2021Weight: 143-144lbs  Time in AF:24.0 hr/day (100.0%)(taking Xarelto)    Spoke with patient and reports feeling well at this time.  Denies fluid symptoms.    OptivolThoracic impedance normal.  Prescribed:  Furosemide 40 mg 1 tablet daily.   Potassium 10 mEq take 1 tablet daily  Labs: 10/04/2019 Creatinine 0.86, BUN 8,   Potassium 4.9, Sodium 137,  07/27/2019 Creatinine1.17, BUN9,   Potassium4.9, [id number] 07/03/2019 Creatinine1.14, BUN8,   Potassium4.0, Sodium131, GFR>60  A complete set of results can be found in Results Review.  Recommendations: No changes and encouraged to call if experiencing any fluid symptoms.  Follow-up plan: ICM clinic phone appointment on9/272021. 91 day device clinic remote transmission10/27/2021.  EP/Cardiology Office Visits: 12/26/2019 with Dr. Caryl Comes.    Copy of ICM check sent to Dr. Caryl Comes.   3 month ICM trend: 11/21/2019    1 Year ICM trend:       Rosalene Billings, RN 11/24/2019 9:41 AM

## 2019-11-29 DIAGNOSIS — I639 Cerebral infarction, unspecified: Secondary | ICD-10-CM | POA: Diagnosis not present

## 2019-11-29 DIAGNOSIS — R2689 Other abnormalities of gait and mobility: Secondary | ICD-10-CM | POA: Diagnosis not present

## 2019-11-29 DIAGNOSIS — M6281 Muscle weakness (generalized): Secondary | ICD-10-CM | POA: Diagnosis not present

## 2019-12-05 ENCOUNTER — Other Ambulatory Visit: Payer: Self-pay

## 2019-12-05 ENCOUNTER — Encounter (INDEPENDENT_AMBULATORY_CARE_PROVIDER_SITE_OTHER): Payer: Self-pay | Admitting: Ophthalmology

## 2019-12-05 ENCOUNTER — Ambulatory Visit (INDEPENDENT_AMBULATORY_CARE_PROVIDER_SITE_OTHER): Payer: Medicare Other | Admitting: Ophthalmology

## 2019-12-05 DIAGNOSIS — H353221 Exudative age-related macular degeneration, left eye, with active choroidal neovascularization: Secondary | ICD-10-CM | POA: Diagnosis not present

## 2019-12-05 DIAGNOSIS — H353211 Exudative age-related macular degeneration, right eye, with active choroidal neovascularization: Secondary | ICD-10-CM

## 2019-12-05 MED ORDER — AFLIBERCEPT 2MG/0.05ML IZ SOLN FOR KALEIDOSCOPE
2.0000 mg | INTRAVITREAL | Status: AC | PRN
  Administered 2019-12-05: 2 mg via INTRAVITREAL

## 2019-12-05 NOTE — Patient Instructions (Signed)
To report any new onset visual acuity changes or decline

## 2019-12-05 NOTE — Progress Notes (Signed)
12/05/2019     CHIEF COMPLAINT Patient presents for Retina Follow Up   HISTORY OF PRESENT ILLNESS: Mark Clayton is a 79 y.o. male who presents to the clinic today for:   HPI    Retina Follow Up    Patient presents with  Wet AMD.  In right eye.  Severity is moderate.  Duration of 10 weeks.  Since onset it is stable.  I, the attending physician,  performed the HPI with the patient and updated documentation appropriately.          Comments    10 Week f\u OU. Possible Eylea OD. OCT  Pt feels OD vision has decreased. Pt states vision is not doing well.       Last edited by Tilda Franco on 12/05/2019 11:01 AM. (History)      Referring physician: [name], MD [place],   [id number]  HISTORICAL INFORMATION:   Selected notes from the MEDICAL RECORD NUMBER    Lab Results  Component Value Date   HGBA1C 6.1 (H) 06/07/2019     CURRENT MEDICATIONS: No current outpatient medications on file. (Ophthalmic Drugs)   No current facility-administered medications for this visit. (Ophthalmic Drugs)   Current Outpatient Medications (Other)  Medication Sig  . acetaminophen (TYLENOL) 325 MG tablet Take 1-2 tablets (325-650 mg total) by mouth every 4 (four) hours as needed for mild pain.  Marland Kitchen atorvastatin (LIPITOR) 80 MG tablet Take 1 tablet (80 mg total) by mouth daily.  . carvedilol (COREG) 6.25 MG tablet Take 1 tablet (6.25 mg total) by mouth 2 (two) times daily with a meal.  . clopidogrel (PLAVIX) 75 MG tablet Take 1 tablet (75 mg total) by mouth daily.  Marland Kitchen ezetimibe (ZETIA) 10 MG tablet Take 1 tablet (10 mg total) by mouth daily.  . furosemide (LASIX) 40 MG tablet TAKE 1 TABLET BY MOUTH  DAILY  . isosorbide mononitrate (IMDUR) 30 MG 24 hr tablet TAKE 1 TABLET BY MOUTH  DAILY  . losartan (COZAAR) 50 MG tablet TAKE 1 TABLET BY MOUTH  DAILY  . meclizine (ANTIVERT) 12.5 MG tablet Take 1 tablet (12.5 mg total) by mouth 2 (two) times daily as needed for  dizziness.  . nitroGLYCERIN (NITROSTAT) 0.4 MG SL tablet   . pantoprazole (PROTONIX) 40 MG tablet TAKE 1 TABLET BY MOUTH  DAILY  . polyethylene glycol (MIRALAX / GLYCOLAX) 17 g packet Take 17 g by mouth daily.  . potassium chloride (KLOR-CON) 10 MEQ tablet Take 1 tablet (10 mEq total) by mouth daily.  . rivaroxaban (XARELTO) 20 MG TABS tablet Take 1 tablet (20 mg total) by mouth daily with supper.  . vitamin B-12 (CYANOCOBALAMIN) 100 MCG tablet Take 100 mcg by mouth daily.    No current facility-administered medications for this visit. (Other)      REVIEW OF SYSTEMS:    ALLERGIES Allergies  Allergen Reactions  . Latex Rash  . Tape Rash and Other (See Comments)    USE PAPER    PAST MEDICAL HISTORY Past Medical History:  Diagnosis Date  . AICD (automatic cardioverter/defibrillator) present 01/17/2003   Medtronic Maximo 7232CX ICD, serial [id number]  . Anemia 02-06-11   takes oral iron  . Arthritis    hands, knees  . CAD (coronary artery disease) 2003   a. h/o MI and CABG in 2003. b. s/p DES to SVG-RPDA-RPLB in 08/2014.  Marland Kitchen Cancer of sigmoid colon ([place]) 2012   a. s/p colon surgery.  Marland Kitchen  Carotid bruit   . Chronic systolic CHF (congestive heart failure) (HCC)    a. EF 20% in 2014; b. 08/2017 Echo: EF 20-25%, diff HK, Gr3 DD. Triv AI. Mod MR. Sev dil LA. Mildly dil RV w/ mildly reduced RV fxn. Mildly dil RA. Mod TR. PASP 47mHg.  Marland Kitchen Cough   . GERD (gastroesophageal reflux disease) 02-06-11  . HTN (hypertension)   . Hyperlipidemia   . Ischemic cardiomyopathy    a. EF 20% in 2014. (Master study EF >20%); b. 08/2017 Echo: EF 20-25%, diff HK. Gr3 DD.  . LV (left ventricular) mural thrombus    a. 12/2012 Echo: EF 20% with mural thrombus No evidence of thrombus on 08/2017 echo.  . Macular degeneration   . Myocardial infarct ([place])    2003  . PAF (paroxysmal atrial fibrillation) (HCC)    a. CHA2DS2VASc = 5-->Xarelto/Tikosyn.   Past Surgical History:  Procedure Laterality Date  .  CARDIAC CATHETERIZATION N/A 09/20/2014   Procedure: Left Heart Cath and Coronary Angiography;  Surgeon: Jettie Booze, MD; LAD 95%, D1 100%, CFX liner percent, OM 200%, OM 390%, RCA 90%, LIMA-LAD okay, SVG-OM 2-OM 3 minimal disease, SVG-RPDA-RPLB 100% between the RPDA and RPL     . CARDIAC CATHETERIZATION N/A 09/20/2014   Procedure: Coronary Stent Intervention;  Surgeon: Jettie Booze, MD; Synergy DES 4 x 24 mm reducing the stenosis to 5%   . CARDIAC DEFIBRILLATOR PLACEMENT  01/17/03   6949 lead. Medtronic. remote-no; with later revision  . CARDIOVERSION N/A 05/22/2016   Procedure: CARDIOVERSION;  Surgeon: Lelon Perla, MD;  Location[facility] ENDOSCOPY;  Service: Cardiovascular;  Laterality: N/A;  . CARDIOVERSION N/A 08/10/2017   Procedure: CARDIOVERSION;  Surgeon: Pixie Casino, MD;  Location: [name] ENDOSCOPY;  Service: Cardiovascular;  Laterality: N/A;  . CATARACT EXTRACTION W/ INTRAOCULAR LENS  IMPLANT, BILATERAL Bilateral June/-July 2009   Dr. Katy Fitch  . CATARACT EXTRACTION W/PHACO Bilateral 2010   Dr. Katy Fitch  . COLON RESECTION  02/09/2011   Procedure: LAPAROSCOPIC SIGMOID COLON RESECTION;  Surgeon: Pedro Earls, MD;  Location: [facility] ORS;  Service: General;  Laterality: N/A;  Laparoscopic Assisted Sigmoid Colectomy  . COLON SURGERY    . COLONOSCOPY  08/31/2011   Procedure: COLONOSCOPY;  Surgeon: Jerene Bears, MD;  Location: [facility] ENDOSCOPY;  Service: Gastroenterology;  Laterality: N/A;  . COLONOSCOPY N/A 09/05/2012   Procedure: COLONOSCOPY;  Surgeon: Jerene Bears, MD;  Location: [facility] ENDOSCOPY;  Service: Gastroenterology;  Laterality: N/A;  . COLONOSCOPY N/A 04/18/2013   Procedure: COLONOSCOPY;  Surgeon: Jerene Bears, MD;  Location: [facility] ENDOSCOPY;  Service: Gastroenterology;  Laterality: N/A;  . COLONOSCOPY N/A 04/09/2014   Procedure: COLONOSCOPY;  Surgeon: Jerene Bears, MD;  Location: [facility] ENDOSCOPY;  Service: Gastroenterology;  Laterality: N/A;  . COLONOSCOPY WITH PROPOFOL N/A 05/03/2017    Procedure: COLONOSCOPY WITH PROPOFOL;  Surgeon: Jerene Bears, MD;  Location: [facility] ENDOSCOPY;  Service: Gastroenterology;  Laterality: N/A;  . CORONARY ANGIOPLASTY    . CORONARY ARTERY BYPASS GRAFT  01/2002   LIMA-LAD, SVG-OM 2-OM 3, SVG-RPDA-RPLB  . CORONARY STENT INTERVENTION N/A 11/22/2018   Procedure: CORONARY STENT INTERVENTION;  Surgeon: Nelva Bush, MD;  Location: [place] CV LAB;  Service: Cardiovascular;  Laterality: N/A;  SVG - RCA  . ICD GENERATOR CHANGE  2010   Medtronic Virtuoso II VR ICD  . ICD GENERATOR CHANGEOUT N/A 12/07/2018   Procedure: ICD GENERATOR CHANGEOUT;  Surgeon: Deboraha Sprang, MD;  Location: [place] CV LAB;  Service:  Cardiovascular;  Laterality: N/A;  . INGUINAL HERNIA REPAIR Right 2000's X 2  . LAPAROSCOPIC RIGHT HEMI COLECTOMY N/A 11/04/2012   Procedure: LAPAROSCOPIC RIGHT HEMI COLECTOMY;  Surgeon: Pedro Earls, MD;  Location: [facility] ORS;  Service: General;  Laterality: N/A;  . LEFT HEART CATH AND CORS/GRAFTS ANGIOGRAPHY Left 11/22/2018   Procedure: LEFT HEART CATH AND CORS/GRAFTS ANGIOGRAPHY;  Surgeon: Nelva Bush, MD;  Location: [place] CV LAB;  Service: Cardiovascular;  Laterality: Left;  . TONSILLECTOMY  ~ 1950    FAMILY HISTORY Family History  Problem Relation Age of Onset  . Hypertension Father   . Hyperlipidemia Father   . Heart disease Father   . Prostate cancer Father   . Alzheimer's disease Mother   . Hypertension Sister   . Hyperlipidemia Sister   . Colon cancer Neg Hx   . Esophageal cancer Neg Hx   . Rectal cancer Neg Hx   . Stomach cancer Neg Hx     SOCIAL HISTORY Social History   Tobacco Use  . Smoking status: Never Smoker  . Smokeless tobacco: Never Used  Vaping Use  . Vaping Use: Never used  Substance Use Topics  . Alcohol use: Yes    Alcohol/week: 2.0 standard drinks    Types: 2 Cans of beer per week  . Drug use: No         OPHTHALMIC EXAM: Base Eye Exam    Visual Acuity (Snellen - Linear)       Right Left   Dist [place] 20/100 -2 CF @ 3'   Dist ph [place] NI NI       Tonometry (Tonopen, 11:06 AM)      Right Left   Pressure 8 7       Pupils      Pupils Dark Light Shape React APD   Right PERRL 3 2 Round Slow None   Left PERRL 3 2 Irregular Sluggish None       Visual Fields (Counting fingers)      Left Right    Full Full       Neuro/Psych    Oriented x3: Yes   Mood/Affect: Normal       Dilation    Both eyes: 1.0% Mydriacyl, 2.5% Phenylephrine @ 11:06 AM        Slit Lamp and Fundus Exam    External Exam      Right Left   External Normal Normal       Slit Lamp Exam      Right Left   Lids/Lashes Normal Normal   Conjunctiva/Sclera White and quiet White and quiet   Cornea Clear Clear   Anterior Chamber Deep and quiet Deep and quiet   Iris Round and reactive Round and reactive   Lens Posterior chamber intraocular lens Posterior chamber intraocular lens   Anterior Vitreous Normal Normal       Fundus Exam      Right Left   Posterior Vitreous Normal    Disc Normal    C/D Ratio 0.25    Macula Hard drusen, Advanced age related macular degeneration, Retinal pigment epithelial atrophy, Macular thickening, Subretinal neovascular membrane fibrotic    Vessels Normal    Periphery Normal           IMAGING AND PROCEDURES  Imaging and Procedures for 12/05/19  OCT, Retina - OU - Both Eyes       Right Eye Quality was good. Scan locations included subfoveal. Central Foveal Thickness: 383. Progression has improved.  Findings include choroidal neovascular membrane, cystoid macular edema, outer retinal atrophy, central retinal atrophy, subretinal hyper-reflective material, disciform scar.   Left Eye Quality was good. Scan locations included subfoveal. Central Foveal Thickness: 438. Progression has been stable. Findings include cystoid macular edema, retinal drusen , subretinal scarring, central retinal atrophy.   Notes OD, with subfoveal disciform scar and atrophy,  improved perifoveal CME and CNVM post Eylea at 10-week interval.  We will repeat today and again in 10 to 12 weeks       Intravitreal Injection, Pharmacologic Agent - OD - Right Eye       Time Out 12/05/2019. 11:27 AM. Confirmed correct patient, procedure, site, and patient consented.   Anesthesia Topical anesthesia was used. Anesthetic medications included [place] 3.5%.   Procedure Preparation included Ofloxacin , Tobramycin 0.3%, 10% betadine to eyelids, 5% betadine to ocular surface. A 30 gauge needle was used.   Injection:  2 mg aflibercept Alfonse Flavors) SOLN   NDC: [id number], Lot: [id number]   Route: Intravitreal, Site: Right Eye, Waste: 0 mg  Post-op Post injection exam found visual acuity of at least counting fingers. The patient tolerated the procedure well. There were no complications. The patient received written and verbal post procedure care education. Post injection medications were not given.                 ASSESSMENT/PLAN:  Exudative age-related macular degeneration of left eye with active choroidal neovascularization (HCC) This disciform scar in the left eye is stable and not treatable  Exudative age-related macular degeneration of right eye with active choroidal neovascularization ([place]) Scotoma controlled successfully limited by using intravitreal Eylea, currently at 10-week interval.  Injection today and again in 10 weeks exam      ICD-10-CM   1. Exudative age-related macular degeneration of right eye with active choroidal neovascularization (HCC)  H35.3211 OCT, Retina - OU - Both Eyes    Intravitreal Injection, Pharmacologic Agent - OD - Right Eye    aflibercept (EYLEA) SOLN 2 mg  2. Exudative age-related macular degeneration of left eye with active choroidal neovascularization ([employer])  H35.3221     1.  Intravitreal Eylea OD today and examination again in 10 weeks  2.  3.  Ophthalmic Meds Ordered this visit:  Meds ordered this encounter  Medications   . aflibercept (EYLEA) SOLN 2 mg       Return in about 10 weeks (around 02/13/2020) for dilate, OD, EYLEA OCT.  There are no Patient Instructions on file for this visit.   Explained the diagnoses, plan, and follow up with the patient and they expressed understanding.  Patient expressed understanding of the importance of proper follow up care.   Clent Demark Aquiles Ruffini M.D. Diseases & Surgery of the Retina and Vitreous [facility] [place] 12/05/19     Abbreviations: M myopia (nearsighted); A astigmatism; H hyperopia (farsighted); P presbyopia; Mrx spectacle prescription;  CTL contact lenses; OD right eye; OS left eye; OU both eyes  XT exotropia; ET esotropia; PEK punctate epithelial keratitis; PEE punctate epithelial erosions; DES dry eye syndrome; MGD meibomian gland dysfunction; ATs artificial tears; PFAT's preservative free artificial tears; [place] nuclear sclerotic cataract; PSC posterior subcapsular cataract; ERM epi-retinal membrane; PVD posterior vitreous detachment; RD retinal detachment; DM diabetes mellitus; DR diabetic retinopathy; NPDR non-proliferative diabetic retinopathy; PDR proliferative diabetic retinopathy; CSME clinically significant macular edema; DME diabetic macular edema; dbh dot blot hemorrhages; CWS cotton wool spot; POAG primary open angle glaucoma; C/D cup-to-disc ratio; HVF humphrey visual field; GVF  goldmann visual field; OCT optical coherence tomography; IOP intraocular pressure; BRVO Branch retinal vein occlusion; CRVO central retinal vein occlusion; CRAO central retinal artery occlusion; BRAO branch retinal artery occlusion; RT retinal tear; SB scleral buckle; PPV pars plana vitrectomy; VH Vitreous hemorrhage; PRP panretinal laser photocoagulation; IVK intravitreal kenalog; VMT vitreomacular traction; MH Macular hole;  NVD neovascularization of the disc; NVE neovascularization elsewhere; AREDS age related eye disease study; ARMD age related macular degeneration;  POAG primary open angle glaucoma; EBMD epithelial/anterior basement membrane dystrophy; ACIOL anterior chamber intraocular lens; IOL intraocular lens; PCIOL posterior chamber intraocular lens; Phaco/IOL phacoemulsification with intraocular lens placement; [place] photorefractive keratectomy; LASIK laser assisted in situ keratomileusis; HTN hypertension; DM diabetes mellitus; COPD chronic obstructive pulmonary disease

## 2019-12-05 NOTE — Assessment & Plan Note (Signed)
This disciform scar in the left eye is stable and not treatable

## 2019-12-05 NOTE — Assessment & Plan Note (Signed)
Scotoma controlled successfully limited by using intravitreal Eylea, currently at 10-week interval.  Injection today and again in 10 weeks exam

## 2019-12-07 ENCOUNTER — Other Ambulatory Visit: Payer: Self-pay | Admitting: *Deleted

## 2019-12-07 MED ORDER — CLOPIDOGREL BISULFATE 75 MG PO TABS
75.0000 mg | ORAL_TABLET | Freq: Every day | ORAL | 0 refills | Status: DC
Start: 2019-12-07 — End: 2020-02-01

## 2019-12-08 DIAGNOSIS — M6281 Muscle weakness (generalized): Secondary | ICD-10-CM | POA: Diagnosis not present

## 2019-12-08 DIAGNOSIS — R2689 Other abnormalities of gait and mobility: Secondary | ICD-10-CM | POA: Diagnosis not present

## 2019-12-08 DIAGNOSIS — I639 Cerebral infarction, unspecified: Secondary | ICD-10-CM | POA: Diagnosis not present

## 2019-12-11 DIAGNOSIS — M6281 Muscle weakness (generalized): Secondary | ICD-10-CM | POA: Diagnosis not present

## 2019-12-11 DIAGNOSIS — R2689 Other abnormalities of gait and mobility: Secondary | ICD-10-CM | POA: Diagnosis not present

## 2019-12-11 DIAGNOSIS — I639 Cerebral infarction, unspecified: Secondary | ICD-10-CM | POA: Diagnosis not present

## 2019-12-14 DIAGNOSIS — M6281 Muscle weakness (generalized): Secondary | ICD-10-CM | POA: Diagnosis not present

## 2019-12-14 DIAGNOSIS — I639 Cerebral infarction, unspecified: Secondary | ICD-10-CM | POA: Diagnosis not present

## 2019-12-14 DIAGNOSIS — R2689 Other abnormalities of gait and mobility: Secondary | ICD-10-CM | POA: Diagnosis not present

## 2019-12-22 DIAGNOSIS — M6281 Muscle weakness (generalized): Secondary | ICD-10-CM | POA: Diagnosis not present

## 2019-12-22 DIAGNOSIS — I639 Cerebral infarction, unspecified: Secondary | ICD-10-CM | POA: Diagnosis not present

## 2019-12-22 DIAGNOSIS — R2689 Other abnormalities of gait and mobility: Secondary | ICD-10-CM | POA: Diagnosis not present

## 2019-12-25 ENCOUNTER — Ambulatory Visit (INDEPENDENT_AMBULATORY_CARE_PROVIDER_SITE_OTHER): Payer: Medicare Other

## 2019-12-25 DIAGNOSIS — Z95810 Presence of automatic (implantable) cardiac defibrillator: Secondary | ICD-10-CM

## 2019-12-25 DIAGNOSIS — I5022 Chronic systolic (congestive) heart failure: Secondary | ICD-10-CM

## 2019-12-25 NOTE — Progress Notes (Signed)
EPIC Encounter for ICM Monitoring  Patient Name: Mark Clayton is a 79 y.o. male Date: 12/25/2019 Primary Care Physican: [name], MD Cardiologist:End Electrophysiologist: Caryl Comes 9/27/2021Weight: 140 lbs  Time in AF:24.0 hr/day (100.0%)(taking Xarelto)    Spoke with patient and reports he missed a day of taking Furosemide.  He thinks the fluid is resolving since he has been urinating a large amount since taking Furosemide today.    OptivolThoracic impedance suggesting possible fluid accumulation starting 12/15/2019.  Prescribed:  Furosemide 40 mg 1 tablet daily.   Potassium 10 mEq take 1 tablet daily  Labs: 10/04/2019 Creatinine 0.86, BUN 8, Potassium 4.9, Sodium 137,  07/27/2019 Creatinine1.17, BUN9, Potassium4.9, [id number] 07/03/2019 Creatinine1.14, BUN8, Potassium4.0, Sodium131, GFR>60  A complete set of results can be found in Results Review.  Recommendations:No changes and encouraged to call if experiencing any fluid symptoms.  Follow-up plan: ICM clinic phone appointment on10/02/2020 to recheck fluid levels. 91 day device clinic remote transmission10/27/2021.  EP/Cardiology Office Visits:12/26/2019 with Dr.Klein.   Copy of ICM check sent to [place].    3 month ICM trend: 12/25/2019    1 Year ICM trend:       Rosalene Billings, RN 12/25/2019 11:11 AM

## 2019-12-26 ENCOUNTER — Encounter: Payer: Medicare Other | Admitting: Internal Medicine

## 2019-12-29 DIAGNOSIS — R2689 Other abnormalities of gait and mobility: Secondary | ICD-10-CM | POA: Diagnosis not present

## 2019-12-29 DIAGNOSIS — I639 Cerebral infarction, unspecified: Secondary | ICD-10-CM | POA: Diagnosis not present

## 2019-12-29 DIAGNOSIS — M6281 Muscle weakness (generalized): Secondary | ICD-10-CM | POA: Diagnosis not present

## 2020-01-03 DIAGNOSIS — I639 Cerebral infarction, unspecified: Secondary | ICD-10-CM | POA: Diagnosis not present

## 2020-01-03 DIAGNOSIS — R2689 Other abnormalities of gait and mobility: Secondary | ICD-10-CM | POA: Diagnosis not present

## 2020-01-03 DIAGNOSIS — M6281 Muscle weakness (generalized): Secondary | ICD-10-CM | POA: Diagnosis not present

## 2020-01-05 ENCOUNTER — Telehealth: Payer: Self-pay | Admitting: Emergency Medicine

## 2020-01-05 NOTE — Telephone Encounter (Signed)
Pt called and wanted to know if Dr Sharlet Salina recommends him getting the covid booster vaccine. Please give the patient a call back. Thanks.

## 2020-01-05 NOTE — Telephone Encounter (Signed)
Oh yes, since no one knows what a good antibody level is, the public health officials are asking everyone to get the booster shot  Maybe in the future they will be able to make a recommendation based on an antibody level and risk of infection, but for now they just tell everyone to do this.

## 2020-01-08 DIAGNOSIS — M6281 Muscle weakness (generalized): Secondary | ICD-10-CM | POA: Diagnosis not present

## 2020-01-08 DIAGNOSIS — R2689 Other abnormalities of gait and mobility: Secondary | ICD-10-CM | POA: Diagnosis not present

## 2020-01-08 DIAGNOSIS — I639 Cerebral infarction, unspecified: Secondary | ICD-10-CM | POA: Diagnosis not present

## 2020-01-08 NOTE — Telephone Encounter (Signed)
LVM for pt of Dr. Gwynn Burly recommendation.

## 2020-01-09 ENCOUNTER — Ambulatory Visit (INDEPENDENT_AMBULATORY_CARE_PROVIDER_SITE_OTHER): Payer: Medicare Other

## 2020-01-09 DIAGNOSIS — Z95810 Presence of automatic (implantable) cardiac defibrillator: Secondary | ICD-10-CM

## 2020-01-09 DIAGNOSIS — I5022 Chronic systolic (congestive) heart failure: Secondary | ICD-10-CM

## 2020-01-09 NOTE — Progress Notes (Signed)
EPIC Encounter for ICM Monitoring  Patient Name: Mark Clayton is a 79 y.o. male Date: 01/09/2020 Primary Care Physican: [name], MD Cardiologist:End Electrophysiologist: Caryl Comes 10/12/2021Weight: 140 lbs  Time in AF:24.0 hr/day (100.0%)(taking Xarelto)    Spoke with patient and reports feeling well at this time.  Denies fluid symptoms.   He does not monitor salt intake.   He eats restaurant foods a few times a week.  OptivolThoracic impedance trending back close to baseline but still suggesting possible fluid accumulation starting 12/15/2019.  Prescribed:  Furosemide 40 mg 1 tablet daily.   Potassium 10 mEq take 1 tablet daily  Labs: 10/04/2019 Creatinine 0.86, BUN 8, Potassium 4.9, Sodium 137  07/27/2019 Creatinine1.17, BUN9, Potassium4.9, [id number] 07/03/2019 Creatinine1.14, BUN8, Potassium4.0, Sodium131, GFR>60  A complete set of results can be found in Results Review.  Recommendations: Encouraged to avoid restaurant foods if possible.  Discussed limiting salt intake to 2000 mg daily and reviewing food labels.  No changes and encouraged to call if experiencing any fluid symptoms.  Follow-up plan: ICM clinic phone appointment on11/10/2019. 91 day device clinic remote transmission10/27/2021.  EP/Cardiology Office Visits:04/15/2020 with Dr.Klein.   Copy of ICM check sent to [name].   3 month ICM trend: 01/09/2020    1 Year ICM trend:       Rosalene Billings, RN 01/09/2020 9:05 AM

## 2020-01-10 DIAGNOSIS — I639 Cerebral infarction, unspecified: Secondary | ICD-10-CM | POA: Diagnosis not present

## 2020-01-10 DIAGNOSIS — M6281 Muscle weakness (generalized): Secondary | ICD-10-CM | POA: Diagnosis not present

## 2020-01-10 DIAGNOSIS — R2689 Other abnormalities of gait and mobility: Secondary | ICD-10-CM | POA: Diagnosis not present

## 2020-01-17 DIAGNOSIS — I639 Cerebral infarction, unspecified: Secondary | ICD-10-CM | POA: Diagnosis not present

## 2020-01-17 DIAGNOSIS — R2689 Other abnormalities of gait and mobility: Secondary | ICD-10-CM | POA: Diagnosis not present

## 2020-01-17 DIAGNOSIS — M6281 Muscle weakness (generalized): Secondary | ICD-10-CM | POA: Diagnosis not present

## 2020-01-22 DIAGNOSIS — R2689 Other abnormalities of gait and mobility: Secondary | ICD-10-CM | POA: Diagnosis not present

## 2020-01-22 DIAGNOSIS — M6281 Muscle weakness (generalized): Secondary | ICD-10-CM | POA: Diagnosis not present

## 2020-01-22 DIAGNOSIS — I639 Cerebral infarction, unspecified: Secondary | ICD-10-CM | POA: Diagnosis not present

## 2020-01-24 ENCOUNTER — Other Ambulatory Visit: Payer: Self-pay

## 2020-01-24 ENCOUNTER — Ambulatory Visit (INDEPENDENT_AMBULATORY_CARE_PROVIDER_SITE_OTHER): Payer: Medicare Other

## 2020-01-24 DIAGNOSIS — Z23 Encounter for immunization: Secondary | ICD-10-CM

## 2020-01-24 DIAGNOSIS — I5022 Chronic systolic (congestive) heart failure: Secondary | ICD-10-CM

## 2020-01-24 DIAGNOSIS — I255 Ischemic cardiomyopathy: Secondary | ICD-10-CM

## 2020-01-24 LAB — CUP PACEART REMOTE DEVICE CHECK
Battery Remaining Longevity: 127 mo
Battery Voltage: 3.03 V
Brady Statistic RV Percent Paced: 0.91 %
Date Time Interrogation Session: 20211027012403
HighPow Impedance: 43 Ohm
HighPow Impedance: 55 Ohm
Implantable Lead Implant Date: 20041020
Implantable Lead Implant Date: 20100913
Implantable Lead Location: 753860
Implantable Lead Location: 753860
Implantable Lead Model: 5076
Implantable Lead Model: 6949
Implantable Pulse Generator Implant Date: 20200909
Lead Channel Impedance Value: 342 Ohm
Lead Channel Impedance Value: 475 Ohm
Lead Channel Pacing Threshold Amplitude: 1.125 V
Lead Channel Pacing Threshold Pulse Width: 0.4 ms
Lead Channel Sensing Intrinsic Amplitude: 31.375 mV
Lead Channel Sensing Intrinsic Amplitude: 31.375 mV
Lead Channel Setting Pacing Amplitude: 2.5 V
Lead Channel Setting Pacing Pulse Width: 0.8 ms
Lead Channel Setting Sensing Sensitivity: 0.45 mV

## 2020-01-25 ENCOUNTER — Other Ambulatory Visit: Payer: Self-pay | Admitting: Internal Medicine

## 2020-01-25 ENCOUNTER — Encounter (INDEPENDENT_AMBULATORY_CARE_PROVIDER_SITE_OTHER): Payer: Medicare Other | Admitting: Ophthalmology

## 2020-01-26 NOTE — Progress Notes (Signed)
Remote ICD transmission.   

## 2020-01-29 DIAGNOSIS — I639 Cerebral infarction, unspecified: Secondary | ICD-10-CM | POA: Diagnosis not present

## 2020-01-29 DIAGNOSIS — M6281 Muscle weakness (generalized): Secondary | ICD-10-CM | POA: Diagnosis not present

## 2020-01-29 DIAGNOSIS — R2689 Other abnormalities of gait and mobility: Secondary | ICD-10-CM | POA: Diagnosis not present

## 2020-02-01 ENCOUNTER — Telehealth: Payer: Self-pay | Admitting: Internal Medicine

## 2020-02-01 MED ORDER — CLOPIDOGREL BISULFATE 75 MG PO TABS
75.0000 mg | ORAL_TABLET | Freq: Every day | ORAL | 1 refills | Status: DC
Start: 2020-02-01 — End: 2020-07-17

## 2020-02-01 NOTE — Telephone Encounter (Signed)
Received fax from [facility] requesting refill for Plavix 75 mg daily. Rx request sent to pharmacy.

## 2020-02-05 ENCOUNTER — Ambulatory Visit (INDEPENDENT_AMBULATORY_CARE_PROVIDER_SITE_OTHER): Payer: Medicare Other

## 2020-02-05 DIAGNOSIS — Z95810 Presence of automatic (implantable) cardiac defibrillator: Secondary | ICD-10-CM | POA: Diagnosis not present

## 2020-02-05 DIAGNOSIS — I5022 Chronic systolic (congestive) heart failure: Secondary | ICD-10-CM | POA: Diagnosis not present

## 2020-02-06 DIAGNOSIS — M6281 Muscle weakness (generalized): Secondary | ICD-10-CM | POA: Diagnosis not present

## 2020-02-06 DIAGNOSIS — R2689 Other abnormalities of gait and mobility: Secondary | ICD-10-CM | POA: Diagnosis not present

## 2020-02-06 DIAGNOSIS — I639 Cerebral infarction, unspecified: Secondary | ICD-10-CM | POA: Diagnosis not present

## 2020-02-07 ENCOUNTER — Telehealth: Payer: Self-pay

## 2020-02-07 ENCOUNTER — Other Ambulatory Visit: Payer: Self-pay | Admitting: Internal Medicine

## 2020-02-07 NOTE — Progress Notes (Signed)
EPIC Encounter for ICM Monitoring  Patient Name: Mark Clayton is a 79 y.o. male Date: 02/07/2020 Primary Care Physican: [name], MD Cardiologist:End Electrophysiologist: Caryl Comes 11/10/2021Weight: 140lbs  Time in AF:24.0 hr/day (100.0%)(taking Xarelto)    Spoke with patient and reports feeling well at this time.  Denies fluid symptoms.    OptivolThoracic impedancetrending back close to baseline but still suggesting possible fluid accumulation starting 12/15/2019.  Prescribed:  Furosemide 40 mg 1 tablet daily.   Potassium 10 mEq take 1 tablet daily  Labs: 10/04/2019 Creatinine 0.86, BUN 8, Potassium 4.9, Sodium 137  07/27/2019 Creatinine1.17, BUN9, Potassium4.9, [id number] 07/03/2019 Creatinine1.14, BUN8, Potassium4.0, Sodium131, GFR>60  A complete set of results can be found in Results Review.  Recommendations: No changes and encouraged to call if experiencing any fluid symptoms.  Follow-up plan: ICM clinic phone appointment on12/14/2021.91 day device clinic remote transmission1/26/2022.  EP/Cardiology Office Visits:04/15/2020 with Dr.Klein.   Copy of ICM check sent to [place].  3 month ICM trend: 02/05/2020    1 Year ICM trend:       Rosalene Billings, RN 02/07/2020 9:38 AM

## 2020-02-07 NOTE — Telephone Encounter (Signed)
Remote ICM transmission received.  Attempted call to patient regarding ICM remote transmission and no answer.  

## 2020-02-13 ENCOUNTER — Other Ambulatory Visit: Payer: Self-pay

## 2020-02-13 ENCOUNTER — Encounter (INDEPENDENT_AMBULATORY_CARE_PROVIDER_SITE_OTHER): Payer: Self-pay | Admitting: Ophthalmology

## 2020-02-13 ENCOUNTER — Ambulatory Visit (INDEPENDENT_AMBULATORY_CARE_PROVIDER_SITE_OTHER): Payer: Medicare Other | Admitting: Ophthalmology

## 2020-02-13 DIAGNOSIS — H353122 Nonexudative age-related macular degeneration, left eye, intermediate dry stage: Secondary | ICD-10-CM

## 2020-02-13 DIAGNOSIS — H353222 Exudative age-related macular degeneration, left eye, with inactive choroidal neovascularization: Secondary | ICD-10-CM | POA: Insufficient documentation

## 2020-02-13 DIAGNOSIS — H353211 Exudative age-related macular degeneration, right eye, with active choroidal neovascularization: Secondary | ICD-10-CM

## 2020-02-13 MED ORDER — AFLIBERCEPT 2MG/0.05ML IZ SOLN FOR KALEIDOSCOPE
2.0000 mg | INTRAVITREAL | Status: AC | PRN
  Administered 2020-02-13: 2 mg via INTRAVITREAL

## 2020-02-13 NOTE — Progress Notes (Signed)
02/13/2020     CHIEF COMPLAINT Patient presents for Retina Follow Up   HISTORY OF PRESENT ILLNESS: Mark Clayton is a 79 y.o. male who presents to the clinic today for:   HPI    Retina Follow Up    Patient presents with  Wet AMD.  In right eye.  This started 10 weeks ago.  Severity is mild.  Duration of 10 weeks.  Since onset it is stable.          Comments    10 WK F/U OD, POSS EYLEA OD  Stable vision, no F/F, no pain          Last edited by Nichola Sizer D on 02/13/2020 11:14 AM. (History)      Referring physician: [name], MD [place],  [place] [address]  HISTORICAL INFORMATION:   Selected notes from the MEDICAL RECORD NUMBER    Lab Results  Component Value Date   HGBA1C 6.1 (H) 06/07/2019     CURRENT MEDICATIONS: No current outpatient medications on file. (Ophthalmic Drugs)   No current facility-administered medications for this visit. (Ophthalmic Drugs)   Current Outpatient Medications (Other)  Medication Sig  . acetaminophen (TYLENOL) 325 MG tablet Take 1-2 tablets (325-650 mg total) by mouth every 4 (four) hours as needed for mild pain.  Marland Kitchen atorvastatin (LIPITOR) 80 MG tablet Take 1 tablet (80 mg total) by mouth daily. Please make yearly appt with Dr. Marlou Porch for January 2022 for future refills. Thank you 1st attempt  . carvedilol (COREG) 6.25 MG tablet Take 1 tablet (6.25 mg total) by mouth 2 (two) times daily with a meal.  . clopidogrel (PLAVIX) 75 MG tablet Take 1 tablet (75 mg total) by mouth daily.  Marland Kitchen ezetimibe (ZETIA) 10 MG tablet Take 1 tablet (10 mg total) by mouth daily.  . furosemide (LASIX) 40 MG tablet TAKE 1 TABLET BY MOUTH  DAILY  . isosorbide mononitrate (IMDUR) 30 MG 24 hr tablet TAKE 1 TABLET BY MOUTH  DAILY  . losartan (COZAAR) 50 MG tablet TAKE 1 TABLET BY MOUTH  DAILY  . meclizine (ANTIVERT) 12.5 MG tablet Take 1 tablet (12.5 mg total) by mouth 2 (two) times daily as needed for dizziness.  .  nitroGLYCERIN (NITROSTAT) 0.4 MG SL tablet DISSOLVE 1 TABLET UNDER THE TONGUE EVERY 5 MINUTES AS  NEEDED FOR CHEST PAIN. MAX  OF 3 TABLETS IN 15 MINUTES. CALL 911 IF PAIN PERSISTS.  Marland Kitchen pantoprazole (PROTONIX) 40 MG tablet TAKE 1 TABLET BY MOUTH  DAILY  . polyethylene glycol (MIRALAX / GLYCOLAX) 17 g packet Take 17 g by mouth daily.  . potassium chloride (KLOR-CON) 10 MEQ tablet Take 1 tablet (10 mEq total) by mouth daily.  . rivaroxaban (XARELTO) 20 MG TABS tablet Take 1 tablet (20 mg total) by mouth daily with supper.  . vitamin B-12 (CYANOCOBALAMIN) 100 MCG tablet Take 100 mcg by mouth daily.    No current facility-administered medications for this visit. (Other)      REVIEW OF SYSTEMS:    ALLERGIES Allergies  Allergen Reactions  . Latex Rash  . Tape Rash and Other (See Comments)    USE PAPER    PAST MEDICAL HISTORY Past Medical History:  Diagnosis Date  . AICD (automatic cardioverter/defibrillator) present 01/17/2003   Medtronic Maximo 7232CX ICD, serial [id number]  . Anemia 02-06-11   takes oral iron  . Arthritis    hands, knees  . CAD (coronary artery disease) 2003   a.  h/o MI and CABG in 2003. b. s/p DES to SVG-RPDA-RPLB in 08/2014.  Marland Kitchen Cancer of sigmoid colon ([place]) 2012   a. s/p colon surgery.  . Carotid bruit   . Chronic systolic CHF (congestive heart failure) (HCC)    a. EF 20% in 2014; b. 08/2017 Echo: EF 20-25%, diff HK, Gr3 DD. Triv AI. Mod MR. Sev dil LA. Mildly dil RV w/ mildly reduced RV fxn. Mildly dil RA. Mod TR. PASP 71mHg.  Marland Kitchen Cough   . Exudative age-related macular degeneration of left eye with active choroidal neovascularization ([place]) 07/12/2019  . Exudative age-related macular degeneration, right eye, with inactive choroidal neovascularization ([place]) 07/12/2019  . GERD (gastroesophageal reflux disease) 02-06-11  . HTN (hypertension)   . Hyperlipidemia   . Ischemic cardiomyopathy    a. EF 20% in 2014. (Master study EF >20%); b. 08/2017 Echo: EF 20-25%, diff  HK. Gr3 DD.  . LV (left ventricular) mural thrombus    a. 12/2012 Echo: EF 20% with mural thrombus No evidence of thrombus on 08/2017 echo.  . Macular degeneration   . Myocardial infarct ([place])    2003  . PAF (paroxysmal atrial fibrillation) (HCC)    a. CHA2DS2VASc = 5-->Xarelto/Tikosyn.   Past Surgical History:  Procedure Laterality Date  . CARDIAC CATHETERIZATION N/A 09/20/2014   Procedure: Left Heart Cath and Coronary Angiography;  Surgeon: Jettie Booze, MD; LAD 95%, D1 100%, CFX liner percent, OM 200%, OM 390%, RCA 90%, LIMA-LAD okay, SVG-OM 2-OM 3 minimal disease, SVG-RPDA-RPLB 100% between the RPDA and RPL     . CARDIAC CATHETERIZATION N/A 09/20/2014   Procedure: Coronary Stent Intervention;  Surgeon: Jettie Booze, MD; Synergy DES 4 x 24 mm reducing the stenosis to 5%   . CARDIAC DEFIBRILLATOR PLACEMENT  01/17/03   6949 lead. Medtronic. remote-no; with later revision  . CARDIOVERSION N/A 05/22/2016   Procedure: CARDIOVERSION;  Surgeon: Lelon Perla, MD;  Location: [facility] ENDOSCOPY;  Service: Cardiovascular;  Laterality: N/A;  . CARDIOVERSION N/A 08/10/2017   Procedure: CARDIOVERSION;  Surgeon: Pixie Casino, MD;  Location: [facility] ENDOSCOPY;  Service: Cardiovascular;  Laterality: N/A;  . CATARACT EXTRACTION W/ INTRAOCULAR LENS  IMPLANT, BILATERAL Bilateral June/-July 2009   Dr. Katy Fitch  . CATARACT EXTRACTION W/PHACO Bilateral 2010   Dr. Katy Fitch  . COLON RESECTION  02/09/2011   Procedure: LAPAROSCOPIC SIGMOID COLON RESECTION;  Surgeon: Pedro Earls, MD;  Location: [facility] ORS;  Service: General;  Laterality: N/A;  Laparoscopic Assisted Sigmoid Colectomy  . COLON SURGERY    . COLONOSCOPY  08/31/2011   Procedure: COLONOSCOPY;  Surgeon: Jerene Bears, MD;  Location: [facility] ENDOSCOPY;  Service: Gastroenterology;  Laterality: N/A;  . COLONOSCOPY N/A 09/05/2012   Procedure: COLONOSCOPY;  Surgeon: Jerene Bears, MD;  Location: [facility] ENDOSCOPY;  Service: Gastroenterology;  Laterality: N/A;  .  COLONOSCOPY N/A 04/18/2013   Procedure: COLONOSCOPY;  Surgeon: Jerene Bears, MD;  Location: [facility] ENDOSCOPY;  Service: Gastroenterology;  Laterality: N/A;  . COLONOSCOPY N/A 04/09/2014   Procedure: COLONOSCOPY;  Surgeon: Jerene Bears, MD;  Location: [facility] ENDOSCOPY;  Service: Gastroenterology;  Laterality: N/A;  . COLONOSCOPY WITH PROPOFOL N/A 05/03/2017   Procedure: COLONOSCOPY WITH PROPOFOL;  Surgeon: Jerene Bears, MD;  Location: [facility] ENDOSCOPY;  Service: Gastroenterology;  Laterality: N/A;  . CORONARY ANGIOPLASTY    . CORONARY ARTERY BYPASS GRAFT  01/2002   LIMA-LAD, SVG-OM 2-OM 3, SVG-RPDA-RPLB  . CORONARY STENT INTERVENTION N/A 11/22/2018   Procedure: CORONARY STENT INTERVENTION;  Surgeon: Nelva Bush,  MD;  Location: [facility];  Service: Cardiovascular;  Laterality: N/A;  SVG - RCA  . ICD GENERATOR CHANGE  2010   Medtronic Virtuoso II VR ICD  . ICD GENERATOR CHANGEOUT N/A 12/07/2018   Procedure: ICD GENERATOR CHANGEOUT;  Surgeon: Deboraha Sprang, MD;  Location: [facility];  Service: Cardiovascular;  Laterality: N/A;  . INGUINAL HERNIA REPAIR Right 2000's X 2  . LAPAROSCOPIC RIGHT HEMI COLECTOMY N/A 11/04/2012   Procedure: LAPAROSCOPIC RIGHT HEMI COLECTOMY;  Surgeon: Pedro Earls, MD;  Location: [facility];  Service: General;  Laterality: N/A;  . LEFT HEART CATH AND CORS/GRAFTS ANGIOGRAPHY Left 11/22/2018   Procedure: LEFT HEART CATH AND CORS/GRAFTS ANGIOGRAPHY;  Surgeon: Nelva Bush, MD;  Location: [facility];  Service: Cardiovascular;  Laterality: Left;  . TONSILLECTOMY  ~ 1950    FAMILY HISTORY Family History  Problem Relation Age of Onset  . Hypertension Father   . Hyperlipidemia Father   . Heart disease Father   . Prostate cancer Father   . Alzheimer's disease Mother   . Hypertension Sister   . Hyperlipidemia Sister   . Colon cancer Neg Hx   . Esophageal cancer Neg Hx   . Rectal cancer Neg Hx   . Stomach cancer Neg Hx     SOCIAL HISTORY Social  History   Tobacco Use  . Smoking status: Never Smoker  . Smokeless tobacco: Never Used  Vaping Use  . Vaping Use: Never used  Substance Use Topics  . Alcohol use: Yes    Alcohol/week: 2.0 standard drinks    Types: 2 Cans of beer per week  . Drug use: No         OPHTHALMIC EXAM:  Base Eye Exam    Visual Acuity (ETDRS)      Right Left   Dist [place] 20/100 +1 CF at 4'   Dist ph [place] 20/80 -2 NI       Tonometry (Tonopen, 11:15 AM)      Right Left   Pressure 16 15       Pupils      Dark Light Shape React APD   Right 3 2 Round Slow None   Left 3 2 Irregular Sluggish None       Visual Fields (Counting fingers)      Left Right    Full Full       Extraocular Movement      Right Left    Full Full       Neuro/Psych    Oriented x3: Yes   Mood/Affect: Normal       Dilation    Right eye: 1.0% Mydriacyl, 2.5% Phenylephrine @ 11:19 AM        Slit Lamp and Fundus Exam    External Exam      Right Left   External Normal Normal       Slit Lamp Exam      Right Left   Lids/Lashes Normal Normal   Conjunctiva/Sclera White and quiet White and quiet   Cornea Clear Clear   Anterior Chamber Deep and quiet Deep and quiet   Iris Round and reactive Round and reactive   Lens Posterior chamber intraocular lens Posterior chamber intraocular lens   Anterior Vitreous Normal Normal       Fundus Exam      Right Left   Posterior Vitreous Normal    Disc Normal    C/D Ratio 0.25    Macula Hard  drusen, Advanced age related macular degeneration, Retinal pigment epithelial atrophy, Macular thickening, Subretinal neovascular membrane fibrotic    Vessels Normal    Periphery Normal           IMAGING AND PROCEDURES  Imaging and Procedures for 02/13/20  OCT, Retina - OU - Both Eyes       Right Eye Quality was good. Scan locations included subfoveal. Central Foveal Thickness: 380. Progression has improved. Findings include subretinal scarring, disciform scar.   Left  Eye Quality was good. Scan locations included subfoveal. Central Foveal Thickness: 434. Findings include abnormal foveal contour, subretinal scarring, disciform scar.   Notes Much less intraretinal fluid, subretinal fluid and is active CNVM status post Eylea OD.  Currently at 10-week follow-up.  Will repeat injection today and again in 10 weeks       Intravitreal Injection, Pharmacologic Agent - OD - Right Eye       Time Out 02/13/2020. 11:38 AM. Confirmed correct patient, procedure, site, and patient consented.   Anesthesia Topical anesthesia was used. Anesthetic medications included [place] 3.5%.   Procedure Preparation included Ofloxacin , Tobramycin 0.3%, 10% betadine to eyelids, 5% betadine to ocular surface. A 30 gauge needle was used.   Injection:  2 mg aflibercept Alfonse Flavors) SOLN   NDC: [id number], Lot: [id number]   Route: Intravitreal, Site: Right Eye, Waste: 0 mg  Post-op Post injection exam found visual acuity of at least counting fingers. The patient tolerated the procedure well. There were no complications. The patient received written and verbal post procedure care education. Post injection medications were not given.                 ASSESSMENT/PLAN:  Exudative age-related macular degeneration of right eye with active choroidal neovascularization (HCC) Active CNVM, improved at 10-week interval on intravitreal Eylea.  Repeat injection today and examination again in 10 weeks  Exudative age-related macular degeneration of left eye with inactive choroidal neovascularization ([place]) Permanent scarring left eye, will observe  Intermediate stage nonexudative age-related macular degeneration of left eye Stable OU,      ICD-10-CM   1. Exudative age-related macular degeneration of right eye with active choroidal neovascularization (HCC)  H35.3211 OCT, Retina - OU - Both Eyes    Intravitreal Injection, Pharmacologic Agent - OD - Right Eye    aflibercept (EYLEA) SOLN  2 mg  2. Exudative age-related macular degeneration of left eye with inactive choroidal neovascularization ([place])  H35.3222   3. Intermediate stage nonexudative age-related macular degeneration of left eye  H35.3122     1.  Repeat intravitreal Eylea OD today and examination repeat in 10 weeks scheduled  2.  3.  Ophthalmic Meds Ordered this visit:  Meds ordered this encounter  Medications  . aflibercept (EYLEA) SOLN 2 mg       Return in about 10 weeks (around 04/23/2020) for dilate, OD, EYLEA OCT.  Patient Instructions  Patient instructed to contact the office promptly for new onset visual acuity decline or distortion    Explained the diagnoses, plan, and follow up with the patient and they expressed understanding.  Patient expressed understanding of the importance of proper follow up care.   Clent Demark Shay Bartoli M.D. Diseases & Surgery of the Retina and Vitreous [facility] [place] 02/13/20     Abbreviations: M myopia (nearsighted); A astigmatism; H hyperopia (farsighted); P presbyopia; Mrx spectacle prescription;  CTL contact lenses; OD right eye; OS left eye; OU both eyes  XT exotropia; ET esotropia; PEK punctate epithelial  keratitis; PEE punctate epithelial erosions; DES dry eye syndrome; MGD meibomian gland dysfunction; ATs artificial tears; PFAT's preservative free artificial tears; [place] nuclear sclerotic cataract; PSC posterior subcapsular cataract; ERM epi-retinal membrane; PVD posterior vitreous detachment; RD retinal detachment; DM diabetes mellitus; DR diabetic retinopathy; NPDR non-proliferative diabetic retinopathy; PDR proliferative diabetic retinopathy; CSME clinically significant macular edema; DME diabetic macular edema; dbh dot blot hemorrhages; CWS cotton wool spot; POAG primary open angle glaucoma; C/D cup-to-disc ratio; HVF humphrey visual field; GVF goldmann visual field; OCT optical coherence tomography; IOP intraocular pressure; BRVO Branch retinal vein  occlusion; CRVO central retinal vein occlusion; CRAO central retinal artery occlusion; BRAO branch retinal artery occlusion; RT retinal tear; SB scleral buckle; PPV pars plana vitrectomy; VH Vitreous hemorrhage; PRP panretinal laser photocoagulation; IVK intravitreal kenalog; VMT vitreomacular traction; MH Macular hole;  NVD neovascularization of the disc; NVE neovascularization elsewhere; AREDS age related eye disease study; ARMD age related macular degeneration; POAG primary open angle glaucoma; EBMD epithelial/anterior basement membrane dystrophy; ACIOL anterior chamber intraocular lens; IOL intraocular lens; PCIOL posterior chamber intraocular lens; Phaco/IOL phacoemulsification with intraocular lens placement; [place] photorefractive keratectomy; LASIK laser assisted in situ keratomileusis; HTN hypertension; DM diabetes mellitus; COPD chronic obstructive pulmonary disease

## 2020-02-13 NOTE — Patient Instructions (Signed)
Patient instructed to contact the office promptly for new onset visual acuity decline or distortion 

## 2020-02-13 NOTE — Assessment & Plan Note (Signed)
Active CNVM, improved at 10-week interval on intravitreal Eylea.  Repeat injection today and examination again in 10 weeks

## 2020-02-13 NOTE — Assessment & Plan Note (Signed)
Stable OU,

## 2020-02-13 NOTE — Assessment & Plan Note (Signed)
Permanent scarring left eye, will observe

## 2020-02-15 ENCOUNTER — Telehealth: Payer: Self-pay | Admitting: Pharmacist

## 2020-02-15 ENCOUNTER — Telehealth: Payer: Medicare Other

## 2020-02-15 NOTE — Chronic Care Management (AMB) (Deleted)
Chronic Care Management Pharmacy  Name: Mark Clayton  MRN: [id number] DOB: 02/20/1941  Chief Complaint/ HPI  Mark Clayton,  79 y.o. , male presents for their Follow-Up CCM visit with the clinical pharmacist via telephone due to COVID-19 Pandemic.  PCP : [name], MD  Their chronic conditions include: HTN, CHF, Afib w/ hx of embolic stroke, CAD  Office Visits: 10/04/19 Dr Sharlet Salina OV: abdominal discomfort - Xray no acute findings.  08/16/19 Dr Sharlet Salina OV: syncope sx improved with lower dose carvedilol. No med changes.  07/27/19 Dr Sharlet Salina OV: BP issues. Reduce KCl to 1 pill daily. Check CBC, BMP. Reduced carvedilol to 6.25 mg BID due to syncopal sx  07/14/19 Dr Sharlet Salina VV: hospital f/u for Mark Clayton. HH referral for stability/weakness.  07/11/19 Dr Sharlet Salina VV: overall poor function s/p ICH. OT/PT ordered.  Consult Visit: 08/22/19 NP Roderic Palau (Afib clinic): pt-reported "spell", no unusual arrythmia per device interrogation. Pt reassured, no med changes.  08/10/19 Dr Caryl Comes VV (cardiology): Afib f/u, plan to leave in Afib since no sx. No med changes.  07/24/19 Dr Zadie Rhine (ophthalmology) 07/20/19 phys med/rehab. 06/06/19 - 06/14/19 hospital admission: ICH s/p fall. Given [place]. Pt found to have acute L MCA infarct as well. Holding clopidogrel and Xarelto. Stopped dofetilide. 06/14/19 - 07/08/19 rehab admission: ICH s/p fall. Cleared to resume Plavix and Xarelto after repeat CT. Mild cognitive impairment, dysphagia. Needs supervision with activity. 04/14/19 Dr Marlou Porch (cardiology): added Zetia to achieve LDL goal < 70.  03/28/19 Dr Caryl Comes (EP): failed cardioversion. Failed amiodarone due weakness/SOB. on dofetilide since 2019.  Medications: Outpatient Encounter Medications as of 02/15/2020  Medication Sig  . acetaminophen (TYLENOL) 325 MG tablet Take 1-2 tablets (325-650 mg total) by mouth every 4 (four) hours as needed for mild pain.  Marland Kitchen atorvastatin (LIPITOR) 80 MG tablet Take  1 tablet (80 mg total) by mouth daily. Please make yearly appt with Dr. Marlou Porch for January 2022 for future refills. Thank you 1st attempt  . carvedilol (COREG) 6.25 MG tablet Take 1 tablet (6.25 mg total) by mouth 2 (two) times daily with a meal.  . clopidogrel (PLAVIX) 75 MG tablet Take 1 tablet (75 mg total) by mouth daily.  Marland Kitchen ezetimibe (ZETIA) 10 MG tablet Take 1 tablet (10 mg total) by mouth daily.  . furosemide (LASIX) 40 MG tablet TAKE 1 TABLET BY MOUTH  DAILY  . isosorbide mononitrate (IMDUR) 30 MG 24 hr tablet TAKE 1 TABLET BY MOUTH  DAILY  . losartan (COZAAR) 50 MG tablet TAKE 1 TABLET BY MOUTH  DAILY  . meclizine (ANTIVERT) 12.5 MG tablet Take 1 tablet (12.5 mg total) by mouth 2 (two) times daily as needed for dizziness.  . nitroGLYCERIN (NITROSTAT) 0.4 MG SL tablet DISSOLVE 1 TABLET UNDER THE TONGUE EVERY 5 MINUTES AS  NEEDED FOR CHEST PAIN. MAX  OF 3 TABLETS IN 15 MINUTES. CALL 911 IF PAIN PERSISTS.  Marland Kitchen pantoprazole (PROTONIX) 40 MG tablet TAKE 1 TABLET BY MOUTH  DAILY  . polyethylene glycol (MIRALAX / GLYCOLAX) 17 g packet Take 17 g by mouth daily.  . potassium chloride (KLOR-CON) 10 MEQ tablet Take 1 tablet (10 mEq total) by mouth daily.  . rivaroxaban (XARELTO) 20 MG TABS tablet Take 1 tablet (20 mg total) by mouth daily with supper.  . vitamin B-12 (CYANOCOBALAMIN) 100 MCG tablet Take 100 mcg by mouth daily.    No facility-administered encounter medications on file as of 02/15/2020.     Current Diagnosis/Assessment:   Goals  Addressed   None     AFIB   Patient is currently rate controlled. Pulse Readings from Last 3 Encounters:  10/04/19 82  08/22/19 74  08/16/19 80   CHA2DS2-VASc Score = 7  The patient's score is based upon: CHF History: 1 HTN History: 1 Diabetes History: 0 Stroke History: 2 Vascular Disease History: 1   Patient has failed these meds in past: dofetilide Patient is currently controlled on the following medications:   carvedilol 6.25 mg BID,    Xarelto 20 mg daily  We discussed: denies bleeding issues  Plan  Continue current medications  Heart Failure / Hypertension   Type: Systolic  Last ejection fraction: <20% (06/07/2019) NYHA Class: I (no actitivty limitation) AHA HF Stage: B (Heart disease present - no symptoms present)  Office blood pressures are  BP Readings from Last 3 Encounters:  10/04/19 118/76  08/22/19 (!) 112/58  08/16/19 120/70   Kidney Function Lab Results  Component Value Date/Time   CREATININE 0.86 10/04/2019 10:36 AM   CREATININE 1.17 07/27/2019 03:10 PM   CREATININE 1.14 07/03/2019 05:17 AM   GFR 60.19 07/27/2019 03:10 PM   GFRNONAA >60 07/03/2019 05:17 AM   GFRAA >60 07/03/2019 05:17 AM   K 4.9 10/04/2019 10:36 AM   K 4.9 07/27/2019 03:10 PM   Patient checks BP at home infrequently  Patient home BP readings are ranging: "good"  Patient has failed these meds in past: n/a Patient is currently controlled on the following medications:   carvedilol 6.25 mg BID,   isosorbide MN 30 mg daily,   furosemide 40 mg daily,   potassium chloride 10 meq BID    We discussed: since PCP decreased dose of carvedilol, no further issues with dizziness.   Plan  Continue current medications and control with diet and exercise    Hyperlipidemia / CAD   LDL goal < 70  Lipid Panel     Component Value Date/Time   CHOL 121 06/07/2019 0540   CHOL 182 11/16/2018 0900   TRIG 112 06/07/2019 0540   HDL 39 (L) 06/07/2019 0540   HDL 56 11/16/2018 0900   CHOLHDL 3.1 06/07/2019 0540   VLDL 22 06/07/2019 0540   LDLCALC 60 06/07/2019 0540   LDLCALC 102 (H) 11/16/2018 0900   LDLDIRECT 104 (H) 03/28/2019 1018   LABVLDL 24 11/16/2018 0900   The ASCVD Risk score (Goff DC Jr., et al., 2013) failed to calculate for the following reasons:   The patient has a prior MI or stroke diagnosis   ASCVD: hx of embolic stroke, CABG, PCI 2016  Patient has failed these meds in past: n/a Patient is currently  controlled on the following medications:   atorvastatin 80 mg daily  ezetimibe 10 mg daily,   clopidogrel 75 mg daily,   isosorbide MN 30 mg daily,   nitroglycerin 0.4 mg SL prn  We discussed:  Cholesterol goals, pt is now at goal LDL < 70 after starting ezetimibe earlier this year.  Plan  Continue current medications  Medication Management   Pt uses [facility] mail order pharmacy for all medications Uses pill box Pt endorses 100% compliance  We discussed: Pt does not have to go without medicines, mail order pharmacy always delivers on time. Denies issues with medication management. Reports Xarelto is $47/month, currently reasonable. Discussed options if donut hole is reached this year, including Engineer, maintenance ($85/month) or PAP.  Plan  Continue current medication management strategy     Follow up: *** month  phone visit  Charlene Brooke, PharmD Clinical Pharmacist [facility] [phone]

## 2020-02-15 NOTE — Progress Notes (Signed)
Chronic Care Management Pharmacy Assistant   Name: Mark Clayton  MRN: [id number] DOB: 08/06/1940  Reason for Encounter: General Adherence Call   PCP : [name], MD  Allergies:   Allergies  Allergen Reactions  . Latex Rash  . Tape Rash and Other (See Comments)    USE PAPER    Medications: Outpatient Encounter Medications as of 02/15/2020  Medication Sig  . acetaminophen (TYLENOL) 325 MG tablet Take 1-2 tablets (325-650 mg total) by mouth every 4 (four) hours as needed for mild pain.  Marland Kitchen atorvastatin (LIPITOR) 80 MG tablet Take 1 tablet (80 mg total) by mouth daily. Please make yearly appt with Dr. Marlou Porch for January 2022 for future refills. Thank you 1st attempt  . carvedilol (COREG) 6.25 MG tablet Take 1 tablet (6.25 mg total) by mouth 2 (two) times daily with a meal.  . clopidogrel (PLAVIX) 75 MG tablet Take 1 tablet (75 mg total) by mouth daily.  Marland Kitchen ezetimibe (ZETIA) 10 MG tablet Take 1 tablet (10 mg total) by mouth daily.  . furosemide (LASIX) 40 MG tablet TAKE 1 TABLET BY MOUTH  DAILY  . isosorbide mononitrate (IMDUR) 30 MG 24 hr tablet TAKE 1 TABLET BY MOUTH  DAILY  . losartan (COZAAR) 50 MG tablet TAKE 1 TABLET BY MOUTH  DAILY  . meclizine (ANTIVERT) 12.5 MG tablet Take 1 tablet (12.5 mg total) by mouth 2 (two) times daily as needed for dizziness.  . nitroGLYCERIN (NITROSTAT) 0.4 MG SL tablet DISSOLVE 1 TABLET UNDER THE TONGUE EVERY 5 MINUTES AS  NEEDED FOR CHEST PAIN. MAX  OF 3 TABLETS IN 15 MINUTES. CALL 911 IF PAIN PERSISTS.  Marland Kitchen pantoprazole (PROTONIX) 40 MG tablet TAKE 1 TABLET BY MOUTH  DAILY  . polyethylene glycol (MIRALAX / GLYCOLAX) 17 g packet Take 17 g by mouth daily.  . potassium chloride (KLOR-CON) 10 MEQ tablet Take 1 tablet (10 mEq total) by mouth daily.  . rivaroxaban (XARELTO) 20 MG TABS tablet Take 1 tablet (20 mg total) by mouth daily with supper.  . vitamin B-12 (CYANOCOBALAMIN) 100 MCG tablet Take 100 mcg by mouth daily.    No  facility-administered encounter medications on file as of 02/15/2020.    Current Diagnosis: Patient Active Problem List   Diagnosis Date Noted  . Exudative age-related macular degeneration of left eye with inactive choroidal neovascularization ([place]) 02/13/2020  . Exudative age-related macular degeneration of right eye with active choroidal neovascularization ([place]) 07/12/2019  . Intermediate stage nonexudative age-related macular degeneration of left eye 07/12/2019  . Hx of completed stroke 06/14/2019  . Dysarthria   . Tinnitus 08/26/2018  . Essential hypertension 01/21/2018  . Coronary artery disease involving native heart with angina pectoris ([place]) 10/19/2017  . Persistent atrial fibrillation   . Routine general medical examination at a health care facility 01/10/2016  . Ischemic cardiomyopathy   . Personal history of colon cancer   . Implantable cardioverter-defibrillator (ICD) in situ 03/09/2012  . Microcytic anemia 01/12/2011  . Cardiac defibrillator  MDT VVI 12/26/2010  . Chronic systolic heart failure ([place]) 11/26/2008    Goals Addressed   None     Follow-Up:  Pharmacist Review   Called and spoke with patient to ask how he has been doing since his last visit with the clinical pharmacist Mendel Ryder. The patient states that he has been doing well ans that he does not have any issues or concerns at this time. He states that he does walk with a cane which does  pretty good. Also he stated that today they dismissed his therapy, he no longer needs it. There patient states that his blood pressure is normal and that he is not having any issues with his meds. He does not follow any special diet but is active around the house cleaning up, washing dishes. I told the patient that I would pass along this information to the clinical pharmacist Mendel Ryder.      Wendy Poet, Clinical Pharmacist [facility]

## 2020-02-17 ENCOUNTER — Other Ambulatory Visit: Payer: Self-pay | Admitting: Internal Medicine

## 2020-02-17 ENCOUNTER — Other Ambulatory Visit: Payer: Self-pay | Admitting: Cardiology

## 2020-02-17 DIAGNOSIS — Z95810 Presence of automatic (implantable) cardiac defibrillator: Secondary | ICD-10-CM

## 2020-02-17 DIAGNOSIS — I5022 Chronic systolic (congestive) heart failure: Secondary | ICD-10-CM

## 2020-02-17 DIAGNOSIS — I255 Ischemic cardiomyopathy: Secondary | ICD-10-CM

## 2020-02-19 DIAGNOSIS — R2689 Other abnormalities of gait and mobility: Secondary | ICD-10-CM | POA: Diagnosis not present

## 2020-02-19 DIAGNOSIS — I639 Cerebral infarction, unspecified: Secondary | ICD-10-CM | POA: Diagnosis not present

## 2020-02-19 DIAGNOSIS — M6281 Muscle weakness (generalized): Secondary | ICD-10-CM | POA: Diagnosis not present

## 2020-02-26 DIAGNOSIS — M6281 Muscle weakness (generalized): Secondary | ICD-10-CM | POA: Diagnosis not present

## 2020-02-26 DIAGNOSIS — I639 Cerebral infarction, unspecified: Secondary | ICD-10-CM | POA: Diagnosis not present

## 2020-02-26 DIAGNOSIS — R2689 Other abnormalities of gait and mobility: Secondary | ICD-10-CM | POA: Diagnosis not present

## 2020-03-04 ENCOUNTER — Telehealth (INDEPENDENT_AMBULATORY_CARE_PROVIDER_SITE_OTHER): Payer: Medicare Other | Admitting: Cardiology

## 2020-03-04 ENCOUNTER — Other Ambulatory Visit: Payer: Self-pay

## 2020-03-04 ENCOUNTER — Telehealth: Payer: Self-pay | Admitting: *Deleted

## 2020-03-04 ENCOUNTER — Encounter: Payer: Self-pay | Admitting: Cardiology

## 2020-03-04 VITALS — BP 104/65 | HR 84 | Ht 70.0 in | Wt 150.0 lb

## 2020-03-04 DIAGNOSIS — Z95810 Presence of automatic (implantable) cardiac defibrillator: Secondary | ICD-10-CM

## 2020-03-04 DIAGNOSIS — I5022 Chronic systolic (congestive) heart failure: Secondary | ICD-10-CM | POA: Diagnosis not present

## 2020-03-04 NOTE — Patient Instructions (Signed)
Medication Instructions:  The current medical regimen is effective;  continue present plan and medications.  *If you need a refill on your cardiac medications before your next appointment, please call your pharmacy*  Follow-Up: At [facility], you and your health needs are our priority.  As part of our continuing mission to provide you with exceptional heart care, we have created designated Provider Care Teams.  These Care Teams include your primary Cardiologist (physician) and Advanced Practice Providers (APPs -  Physician Assistants and Nurse Practitioners) who all work together to provide you with the care you need, when you need it.  We recommend signing up for the patient portal called "MyChart".  Sign up information is provided on this After Visit Summary.  MyChart is used to connect with patients for Virtual Visits (Telemedicine).  Patients are able to view lab/test results, encounter notes, upcoming appointments, etc.  Non-urgent messages can be sent to your provider as well.   To learn more about what you can do with MyChart, go to [email or web address].    Your next appointment:   6 months   The format for your next appointment:   Virtual Visit  Provider:   Dr. Candee Furbish   Other Instructions Thank you for choosing [facility]!!

## 2020-03-04 NOTE — Progress Notes (Signed)
Virtual Visit via Telephone Note   This visit type was conducted due to national recommendations for restrictions regarding the COVID-19 Pandemic (e.g. social distancing) in an effort to limit this patient's exposure and mitigate transmission in our community.  Due to his co-morbid illnesses, this patient is at least at moderate risk for complications without adequate follow up.  This format is felt to be most appropriate for this patient at this time.  The patient did not have access to video technology/had technical difficulties with video requiring transitioning to audio format only (telephone).  All issues noted in this document were discussed and addressed.  No physical exam could be performed with this format.  Please refer to the patient's chart for his  consent to telehealth for [facility].    Date:  03/04/2020   ID:  Mark Clayton, DOB 1940-07-15, MRN [id number] The patient was identified using 2 identifiers.  Patient Location: Home Provider Location: Home Office  PCP:  [name], MD  Cardiologist:  Candee Furbish, MD  Electrophysiologist:  Mark Axe, MD   Evaluation Performed:  Follow-Up Visit  Chief Complaint: Atrial fibrillation, recent stroke in March 2021  History of Present Illness:    Mark Clayton is a 79 y.o. male here for the follow-up of chronic systolic heart failure EF 20%, ICD, recent stroke in March or 2021, persistent atrial fibrillation.    Chronic systolic heart failure secondary to ischemic cardiomyopathy with prior CABG, PCI in 2016 with persistent atrial fibrillation with severe atrial enlargement former patient of Dr. Saunders Revel here for follow-up.  Previously had failed cardioversion then reverted spontaneously to sinus rhythm.  On dofetilide.  Had side effects with amiodarone previously.  Prior echocardiogram 2019 with EF between 20 and 35%.  Moderate mitral regurgitation.  Cardiac catheterization 2016 reviewed.  Patent LIMA to LAD,  SVG to OM and OM 3 jump graft patent.  SVG to PDA patent.  Second portion of the graft which goes to the posterior lateral artery was occluded.  He had greater than 90% distal RCA stenosis successfully stented.  Used to see Dr. Albertine Patricia.  Had a stroke 05/5359, embolic with small ICH that resolved on subsequent CT (now back on Xarelto). 5 weeks in hospital. Can not walk on right leg. PT. Legally blind. Dr. Radene Ou note reivewed.   Closed down barber shop. Living with grandaughter. She is going to beauty school.    The patient does not have symptoms concerning for COVID-19 infection (fever, chills, cough, or new shortness of breath).    Past Medical History:  Diagnosis Date  . AICD (automatic cardioverter/defibrillator) present 01/17/2003   Medtronic Maximo 7232CX ICD, serial [id number]  . Anemia 02-06-11   takes oral iron  . Arthritis    hands, knees  . CAD (coronary artery disease) 2003   a. h/o MI and CABG in 2003. b. s/p DES to SVG-RPDA-RPLB in 08/2014.  Marland Kitchen Cancer of sigmoid colon ([place]) 2012   a. s/p colon surgery.  . Carotid bruit   . Chronic systolic CHF (congestive heart failure) (HCC)    a. EF 20% in 2014; b. 08/2017 Echo: EF 20-25%, diff HK, Gr3 DD. Triv AI. Mod MR. Sev dil LA. Mildly dil RV w/ mildly reduced RV fxn. Mildly dil RA. Mod TR. PASP 66mHg.  Marland Kitchen Cough   . Exudative age-related macular degeneration of left eye with active choroidal neovascularization ([place]) 07/12/2019  . Exudative age-related macular degeneration, right eye, with inactive choroidal neovascularization ([place]) 07/12/2019  .  GERD (gastroesophageal reflux disease) 02-06-11  . HTN (hypertension)   . Hyperlipidemia   . Ischemic cardiomyopathy    a. EF 20% in 2014. (Master study EF >20%); b. 08/2017 Echo: EF 20-25%, diff HK. Gr3 DD.  . LV (left ventricular) mural thrombus    a. 12/2012 Echo: EF 20% with mural thrombus No evidence of thrombus on 08/2017 echo.  . Macular degeneration   . Myocardial infarct ([place])     2003  . PAF (paroxysmal atrial fibrillation) (HCC)    a. CHA2DS2VASc = 5-->Xarelto/Tikosyn.   Past Surgical History:  Procedure Laterality Date  . CARDIAC CATHETERIZATION N/A 09/20/2014   Procedure: Left Heart Cath and Coronary Angiography;  Surgeon: Jettie Booze, MD; LAD 95%, D1 100%, CFX liner percent, OM 200%, OM 390%, RCA 90%, LIMA-LAD okay, SVG-OM 2-OM 3 minimal disease, SVG-RPDA-RPLB 100% between the RPDA and RPL     . CARDIAC CATHETERIZATION N/A 09/20/2014   Procedure: Coronary Stent Intervention;  Surgeon: Jettie Booze, MD; Synergy DES 4 x 24 mm reducing the stenosis to 5%   . CARDIAC DEFIBRILLATOR PLACEMENT  01/17/03   6949 lead. Medtronic. remote-no; with later revision  . CARDIOVERSION N/A 05/22/2016   Procedure: CARDIOVERSION;  Surgeon: Lelon Perla, MD;  Location: [facility] ENDOSCOPY;  Service: Cardiovascular;  Laterality: N/A;  . CARDIOVERSION N/A 08/10/2017   Procedure: CARDIOVERSION;  Surgeon: Pixie Casino, MD;  Location: [facility] ENDOSCOPY;  Service: Cardiovascular;  Laterality: N/A;  . CATARACT EXTRACTION W/ INTRAOCULAR LENS  IMPLANT, BILATERAL Bilateral June/-July 2009   Dr. Katy Fitch  . CATARACT EXTRACTION W/PHACO Bilateral 2010   Dr. Katy Fitch  . COLON RESECTION  02/09/2011   Procedure: LAPAROSCOPIC SIGMOID COLON RESECTION;  Surgeon: Pedro Earls, MD;  Location: [facility] ORS;  Service: General;  Laterality: N/A;  Laparoscopic Assisted Sigmoid Colectomy  . COLON SURGERY    . COLONOSCOPY  08/31/2011   Procedure: COLONOSCOPY;  Surgeon: Jerene Bears, MD;  Location: [facility] ENDOSCOPY;  Service: Gastroenterology;  Laterality: N/A;  . COLONOSCOPY N/A 09/05/2012   Procedure: COLONOSCOPY;  Surgeon: Jerene Bears, MD;  Location: [facility] ENDOSCOPY;  Service: Gastroenterology;  Laterality: N/A;  . COLONOSCOPY N/A 04/18/2013   Procedure: COLONOSCOPY;  Surgeon: Jerene Bears, MD;  Location: [facility] ENDOSCOPY;  Service: Gastroenterology;  Laterality: N/A;  . COLONOSCOPY N/A 04/09/2014   Procedure:  COLONOSCOPY;  Surgeon: Jerene Bears, MD;  Location: [facility] ENDOSCOPY;  Service: Gastroenterology;  Laterality: N/A;  . COLONOSCOPY WITH PROPOFOL N/A 05/03/2017   Procedure: COLONOSCOPY WITH PROPOFOL;  Surgeon: Jerene Bears, MD;  Location: [facility] ENDOSCOPY;  Service: Gastroenterology;  Laterality: N/A;  . CORONARY ANGIOPLASTY    . CORONARY ARTERY BYPASS GRAFT  01/2002   LIMA-LAD, SVG-OM 2-OM 3, SVG-RPDA-RPLB  . CORONARY STENT INTERVENTION N/A 11/22/2018   Procedure: CORONARY STENT INTERVENTION;  Surgeon: Nelva Bush, MD;  Location: [facility] CV LAB;  Service: Cardiovascular;  Laterality: N/A;  SVG - RCA  . ICD GENERATOR CHANGE  2010   Medtronic Virtuoso II VR ICD  . ICD GENERATOR CHANGEOUT N/A 12/07/2018   Procedure: ICD GENERATOR CHANGEOUT;  Surgeon: Deboraha Sprang, MD;  Location: [facility] CV LAB;  Service: Cardiovascular;  Laterality: N/A;  . INGUINAL HERNIA REPAIR Right 2000's X 2  . LAPAROSCOPIC RIGHT HEMI COLECTOMY N/A 11/04/2012   Procedure: LAPAROSCOPIC RIGHT HEMI COLECTOMY;  Surgeon: Pedro Earls, MD;  Location: [facility] ORS;  Service: General;  Laterality: N/A;  . LEFT HEART CATH AND CORS/GRAFTS ANGIOGRAPHY Left 11/22/2018   Procedure:  LEFT HEART CATH AND CORS/GRAFTS ANGIOGRAPHY;  Surgeon: Nelva Bush, MD;  Location: [facility] CV LAB;  Service: Cardiovascular;  Laterality: Left;  . TONSILLECTOMY  ~ 1950     Current Meds  Medication Sig  . acetaminophen (TYLENOL) 325 MG tablet Take 1-2 tablets (325-650 mg total) by mouth every 4 (four) hours as needed for mild pain.  Marland Kitchen atorvastatin (LIPITOR) 80 MG tablet Take 1 tablet (80 mg total) by mouth daily. Please make yearly appt with Dr. Marlou Porch for January 2022 for future refills. Thank you 1st attempt  . carvedilol (COREG) 6.25 MG tablet Take 1 tablet (6.25 mg total) by mouth 2 (two) times daily with a meal.  . clopidogrel (PLAVIX) 75 MG tablet Take 1 tablet (75 mg total) by mouth daily.  Marland Kitchen ezetimibe (ZETIA) 10 MG tablet TAKE 1 TABLET  BY MOUTH  DAILY  . furosemide (LASIX) 40 MG tablet TAKE 1 TABLET BY MOUTH  DAILY  . isosorbide mononitrate (IMDUR) 30 MG 24 hr tablet TAKE 1 TABLET BY MOUTH  DAILY  . losartan (COZAAR) 50 MG tablet TAKE 1 TABLET BY MOUTH  DAILY  . nitroGLYCERIN (NITROSTAT) 0.4 MG SL tablet DISSOLVE 1 TABLET UNDER THE TONGUE EVERY 5 MINUTES AS  NEEDED FOR CHEST PAIN. MAX  OF 3 TABLETS IN 15 MINUTES. CALL 911 IF PAIN PERSISTS.  Marland Kitchen pantoprazole (PROTONIX) 40 MG tablet TAKE 1 TABLET BY MOUTH  DAILY  . potassium chloride (KLOR-CON) 10 MEQ tablet Take 1 tablet (10 mEq total) by mouth daily.  . rivaroxaban (XARELTO) 20 MG TABS tablet Take 1 tablet (20 mg total) by mouth daily with supper.  . vitamin B-12 (CYANOCOBALAMIN) 100 MCG tablet Take 100 mcg by mouth daily.      Allergies:   Latex and Tape   Social History   Tobacco Use  . Smoking status: Never Smoker  . Smokeless tobacco: Never Used  Vaping Use  . Vaping Use: Never used  Substance Use Topics  . Alcohol use: Yes    Alcohol/week: 2.0 standard drinks    Types: 2 Cans of beer per week  . Drug use: No     Family Hx: The patient's family history includes Alzheimer's disease in his mother; Heart disease in his father; Hyperlipidemia in his father and sister; Hypertension in his father and sister; Prostate cancer in his father. There is no history of Colon cancer, Esophageal cancer, Rectal cancer, or Stomach cancer.  ROS:   Please see the history of present illness.    No fevers chills nausea vomiting bleeding syncope All other systems reviewed and are negative.   Prior CV studies:   The following studies were reviewed today:  Echocardiogram 06/07/2019 in the setting of embolic stroke  1. No mural thrombus with Definity contrast. Left ventricular ejection  fraction, by estimation, is <20%. The left ventricle has severely  decreased function. The left ventricle demonstrates global hypokinesis  with regional variation. The left ventricular    internal cavity size was moderately to severely dilated.  2. Right ventricular systolic function is moderately reduced. The right  ventricular size is mildly enlarged. A AICD wire is visualized. There is  moderately elevated pulmonary artery systolic pressure.  3. Left atrial size was moderately dilated.  4. The mitral valve is abnormal. Mild mitral valve regurgitation.  5. The aortic valve is tricuspid. Aortic valve regurgitation is mild.  Mild aortic valve sclerosis is present, with no evidence of aortic valve  stenosis.  6. The inferior vena cava is dilated  in size with <50% respiratory  variability, suggesting right atrial pressure of 15 mmHg.   Comparison(s): Changes from prior study are noted. 09/03/2017: LVEF 20-25%.   Labs/Other Tests and Data Reviewed:    EKG:  An ECG dated 08/22/19 was personally reviewed today and demonstrated:  AFIB 74 rate controlled  Recent Labs: 03/13/2019: Pro B Natriuretic peptide (BNP) 410.0 06/14/2019: Magnesium 2.2 10/04/2019: ALT 11; BUN 8; Creat 0.86; Hemoglobin 13.1; Platelets 313; Potassium 4.9; Sodium 137   Recent Lipid Panel Lab Results  Component Value Date/Time   CHOL 121 06/07/2019 05:40 AM   CHOL 182 11/16/2018 09:00 AM   TRIG 112 06/07/2019 05:40 AM   HDL 39 (L) 06/07/2019 05:40 AM   HDL 56 11/16/2018 09:00 AM   CHOLHDL 3.1 06/07/2019 05:40 AM   LDLCALC 60 06/07/2019 05:40 AM   LDLCALC 102 (H) 11/16/2018 09:00 AM   LDLDIRECT 104 (H) 03/28/2019 10:18 AM    Wt Readings from Last 3 Encounters:  03/04/20 150 lb (68 kg)  08/22/19 133 lb 6.4 oz (60.5 kg)  08/16/19 135 lb 3.2 oz (61.3 kg)      Objective:    Vital Signs:  BP 104/65   Pulse 84   Ht 5\' 10"  (1.778 m)   Wt 150 lb (68 kg)   BMI 21.52 kg/m    VITAL SIGNS:  reviewed  ASSESSMENT & PLAN:    Embolic stroke -March 5188.  Definity contrast study showed no evidence of left ventricular thrombus.  Still has right-sided weakness.  Continues to work with physical  therapy.  Has his granddaughter to help.  Neurology notes reviewed.  Chronic systolic heart failure with ischemic cardiomyopathy -On carvedilol losartan.  Continue.  NYHA class I symptoms.  No orthopnea.  No PND.  EF remains severely reduced.  Low-salt.  ICD -Dr. Caryl Comes has been following.  No defibrillations.  Essential hypertension -Good overall control.  No changes made.  Continue current medical management.  Atrial fibrillation persistent -Dr. Caryl Comes following.  No longer on Tikosyn.  Last EKG as above showed rate controlled atrial fibrillation.  Asymptomatic. -Xarelto 20 mg.  No further bleeding.  Did have embolic stroke.  March 2021.  Okay with continuation of Xarelto and Plavix by neurology. Last CBC shows hemoglobin 13.1.  Creatinine 0.86.  Hyperlipidemia -LDL 60 in March or 2021.  Excellent, at goal of less than 70 with his coronary artery disease as well as cerebrovascular disease.  No myalgias on current medication management.  On atorvastatin 80 and Zetia 10.  Coronary artery disease status post CABG/angina well-controlled -No changes, isosorbide for antianginal approach.  Plavix monotherapy as well.  This has been longstanding.  Okayed by neurology.     Shared Decision Making/Informed Consent        COVID-19 Education: The signs and symptoms of COVID-19 were discussed with the patient and how to seek care for testing (follow up with PCP or arrange E-visit).  The importance of social distancing was discussed today.  He has gotten his booster.  Does not allow anyone without vaccination into his house.  Time:   Today, I have spent 25 minutes with the patient with telehealth technology discussing the above problems, review of hospital records discharge summary echocardiogram EKGs lab work.     Medication Adjustments/Labs and Tests Ordered: Current medicines are reviewed at length with the patient today.  Concerns regarding medicines are outlined above.   Tests  Ordered: No orders of the defined types were placed in this encounter.  Medication Changes: No orders of the defined types were placed in this encounter.   Follow Up:  Virtual Visit  in 6 month(s)  Signed, Candee Furbish, MD  03/04/2020 10:02 AM    [place]

## 2020-03-04 NOTE — Telephone Encounter (Signed)
Patient Consent for Virtual Visit   Mark Clayton, you are scheduled for a virtual visit with your provider today.  Just as we do with appointments in the office, we must obtain your consent to participate.  Your consent will be active for this visit and any virtual visit you may have with one of our providers in the next 365 days.  If you have a MyChart account, I can also send a copy of this consent to you electronically.  All virtual visits are billed to your insurance company just like a traditional visit in the office.  As this is a virtual visit, video technology does not allow for your provider to perform a traditional examination.  This may limit your provider's ability to fully assess your condition.  If your provider identifies any concerns that need to be evaluated in person or the need to arrange testing such as labs, EKG, etc, we will make arrangements to do so.  Although advances in technology are sophisticated, we cannot ensure that it will always work on either your end or our end.  If the connection with a video visit is poor, we may have to switch to a telephone visit.  With either a video or telephone visit, we are not always able to ensure that we have a secure connection.   I need to obtain your verbal consent now.   Are you willing to proceed with your visit today?  yes   CONSENT FOR VIRTUAL VISIT FOR:  Mark Clayton  By participating in this virtual visit I agree to the following:  I hereby voluntarily request, consent and authorize [facility] and its employed or contracted physicians, physician assistants, nurse practitioners or other licensed health care professionals (the Practitioner), to provide me with telemedicine health care services (the "Services") as deemed necessary by the treating Practitioner. I acknowledge and consent to receive the Services by the Practitioner via telemedicine. I understand that the telemedicine visit will involve communicating with the  Practitioner through live audiovisual communication technology and the disclosure of certain medical information by electronic transmission. I acknowledge that I have been given the opportunity to request an in-person assessment or other available alternative prior to the telemedicine visit and am voluntarily participating in the telemedicine visit.  I understand that I have the right to withhold or withdraw my consent to the use of telemedicine in the course of my care at any time, without affecting my right to future care or treatment, and that the Practitioner or I may terminate the telemedicine visit at any time. I understand that I have the right to inspect all information obtained and/or recorded in the course of the telemedicine visit and may receive copies of available information for a reasonable fee.  I understand that some of the potential risks of receiving the Services via telemedicine include:  Marland Kitchen Delay or interruption in medical evaluation due to technological equipment failure or disruption; . Information transmitted may not be sufficient (e.g. poor resolution of images) to allow for appropriate medical decision making by the Practitioner; and/or  . In rare instances, security protocols could fail, causing a breach of personal health information.  Furthermore, I acknowledge that it is my responsibility to provide information about my medical history, conditions and care that is complete and accurate to the best of my ability. I acknowledge that Practitioner's advice, recommendations, and/or decision may be based on factors not within their control, such as incomplete or inaccurate data provided by me or distortions  of diagnostic images or specimens that may result from electronic transmissions. I understand that the practice of medicine is not an exact science and that Practitioner makes no warranties or guarantees regarding treatment outcomes. I acknowledge that a copy of this consent can be  made available to me via my patient portal ([place]), or I can request a printed copy by calling the office of [facility].    I understand that my insurance will be billed for this visit.   I have read or had this consent read to me. . I understand the contents of this consent, which adequately explains the benefits and risks of the Services being provided via telemedicine.  . I have been provided ample opportunity to ask questions regarding this consent and the Services and have had my questions answered to my satisfaction. . I give my informed consent for the services to be provided through the use of telemedicine in my medical care

## 2020-03-12 ENCOUNTER — Ambulatory Visit (INDEPENDENT_AMBULATORY_CARE_PROVIDER_SITE_OTHER): Payer: Medicare Other

## 2020-03-12 DIAGNOSIS — Z95810 Presence of automatic (implantable) cardiac defibrillator: Secondary | ICD-10-CM | POA: Diagnosis not present

## 2020-03-12 DIAGNOSIS — I5022 Chronic systolic (congestive) heart failure: Secondary | ICD-10-CM

## 2020-03-15 ENCOUNTER — Telehealth: Payer: Self-pay | Admitting: Internal Medicine

## 2020-03-15 NOTE — Telephone Encounter (Signed)
   Patient calling to report he feels "jittery" on the inside. He states he is having trouble holding items in his hand.  History of stroke   Call transferred to [facility]

## 2020-03-15 NOTE — Telephone Encounter (Signed)
Tremors in right arm that has increased over last 4-5days.  Was taking therapy for it that has stopped 3 weeks ago. Denies pain or numbness in extremity.  No slurred speech, is able to walk short distance with cane/walker.  Will consult PCP.

## 2020-03-15 NOTE — Progress Notes (Signed)
EPIC Encounter for ICM Monitoring  Patient Name: Mark Clayton is a 79 y.o. male Date: 03/15/2020 Primary Care Physican: [name], MD Cardiologist:[facility] Electrophysiologist: Caryl Comes 12/17//2021Weight: 140lbs  Time in AF:24.0 hr/day (100.0%)(taking Xarelto)   Spoke with patient and reports feeling well at this time.  Denies fluid symptoms.    OptivolThoracic impedancenormal.  Prescribed:  Furosemide 40 mg 1 tablet daily. Pt taking differently:  Taking every other day.  Potassium 10 mEq take 1 tablet daily  Labs: 10/04/2019 Creatinine 0.86, BUN 8, Potassium 4.9, Sodium 137  07/27/2019 Creatinine1.17, BUN9, Potassium4.9, [id number] 07/03/2019 Creatinine1.14, BUN8, Potassium4.0, Sodium131, GFR>60  A complete set of results can be found in Results Review.  Recommendations:No changes and encouraged to call if experiencing any fluid symptoms.  Follow-up plan: ICM clinic phone appointment on2/09/2020.91 day device clinic remote transmission1/26/2022.  EP/Cardiology Office Visits:1/17/2022with Dr.Klein.   Copy of ICM check sent to [facility].  3 month ICM trend: 03/12/2020    1 Year ICM trend:       Rosalene Billings, RN 03/15/2020 10:32 AM

## 2020-03-15 NOTE — Telephone Encounter (Signed)
Patient calling back because he was confused about what team health told him and spoke with team health and he was advised to go to the ER, patient refusing to go to ER at this time.

## 2020-03-15 NOTE — Telephone Encounter (Signed)
Can do visit within a week. This doesn't sound new to me.

## 2020-03-15 NOTE — Telephone Encounter (Signed)
Called patient back and scheduled him an appointment for 03/18/2020.

## 2020-03-18 ENCOUNTER — Ambulatory Visit (INDEPENDENT_AMBULATORY_CARE_PROVIDER_SITE_OTHER): Payer: Medicare Other | Admitting: Internal Medicine

## 2020-03-18 ENCOUNTER — Other Ambulatory Visit: Payer: Self-pay

## 2020-03-18 ENCOUNTER — Encounter: Payer: Self-pay | Admitting: Internal Medicine

## 2020-03-18 VITALS — BP 132/66 | HR 63 | Temp 96.9°F | Ht 70.0 in | Wt 133.6 lb

## 2020-03-18 DIAGNOSIS — I10 Essential (primary) hypertension: Secondary | ICD-10-CM | POA: Diagnosis not present

## 2020-03-18 DIAGNOSIS — Z8673 Personal history of transient ischemic attack (TIA), and cerebral infarction without residual deficits: Secondary | ICD-10-CM | POA: Diagnosis not present

## 2020-03-18 LAB — CBC
HCT: 35.6 % — ABNORMAL LOW (ref 39.0–52.0)
Hemoglobin: 12 g/dL — ABNORMAL LOW (ref 13.0–17.0)
MCHC: 33.8 g/dL (ref 30.0–36.0)
MCV: 90.5 fl (ref 78.0–100.0)
Platelets: 159 10*3/uL (ref 150.0–400.0)
RBC: 3.94 Mil/uL — ABNORMAL LOW (ref 4.22–5.81)
RDW: 14.3 % (ref 11.5–15.5)
WBC: 4.7 10*3/uL (ref 4.0–10.5)

## 2020-03-18 LAB — COMPREHENSIVE METABOLIC PANEL
ALT: 10 U/L (ref 0–53)
AST: 13 U/L (ref 0–37)
Albumin: 3.9 g/dL (ref 3.5–5.2)
Alkaline Phosphatase: 81 U/L (ref 39–117)
BUN: 11 mg/dL (ref 6–23)
CO2: 29 mEq/L (ref 19–32)
Calcium: 9.4 mg/dL (ref 8.4–10.5)
Chloride: 103 mEq/L (ref 96–112)
Creatinine, Ser: 0.98 mg/dL (ref 0.40–1.50)
GFR: 73.46 mL/min (ref >60.00)
Glucose, Bld: 110 mg/dL — ABNORMAL HIGH (ref 70–99)
Potassium: 4.1 mEq/L (ref 3.5–5.1)
Sodium: 136 mEq/L (ref 135–145)
Total Bilirubin: 1.5 mg/dL — ABNORMAL HIGH (ref 0.2–1.2)
Total Protein: 6.4 g/dL (ref 6.0–8.3)

## 2020-03-18 LAB — MAGNESIUM: Magnesium: 1.9 mg/dL (ref 1.5–2.5)

## 2020-03-18 NOTE — Patient Instructions (Addendum)
We can get you in for physical therapy to help.  We are checking the labs today.

## 2020-03-18 NOTE — Progress Notes (Signed)
   Subjective:   Patient ID: Mark Clayton, male    DOB: [birth year], 79 y.o.   MRN: [id number]  HPI The patient is a 79 YO man coming in for concerns about weakness in his legs. Had stroke and decent recovery afterwards. Still with weight lower and unable to gain a significant amount of weight back. Is able to get around with walker in the house but tires easily and unable to do much. Legs are significantly weaker and feels that recent PT did not help with that much. Denies fevers or chills. Denies new stroke symptoms. Overall stable but wants to resume PT and improve.   Review of Systems  Constitutional: Positive for activity change and unexpected weight change.  HENT: Negative.   Eyes: Negative.   Respiratory: Negative for cough, chest tightness and shortness of breath.   Cardiovascular: Negative for chest pain, palpitations and leg swelling.  Gastrointestinal: Negative for abdominal distention, abdominal pain, constipation, diarrhea, nausea and vomiting.  Musculoskeletal: Negative.   Skin: Negative.   Neurological: Positive for weakness.  Psychiatric/Behavioral: Negative.     Objective:  Physical Exam Constitutional:      Appearance: He is well-developed and well-nourished.  HENT:     Head: Normocephalic and atraumatic.  Eyes:     Extraocular Movements: EOM normal.  Cardiovascular:     Rate and Rhythm: Normal rate and regular rhythm.  Pulmonary:     Effort: Pulmonary effort is normal. No respiratory distress.     Breath sounds: Normal breath sounds. No wheezing or rales.  Abdominal:     General: Bowel sounds are normal. There is no distension.     Palpations: Abdomen is soft.     Tenderness: There is no abdominal tenderness. There is no rebound.  Musculoskeletal:        General: No edema.     Cervical back: Normal range of motion.  Skin:    General: Skin is warm and dry.  Neurological:     Mental Status: He is alert and oriented to person, place, and time. Mental status  is at baseline.     Motor: Weakness present.     Coordination: Coordination normal.     Comments: Significant muscle wasting in the right leg compared to left, wheelchair for ambulation in office  Psychiatric:        Mood and Affect: Mood and affect normal.     Vitals:   03/18/20 1338  BP: 132/66  Pulse: 63  Temp: (!) 96.9 F (36.1 C)  TempSrc: Oral  SpO2: 99%  Weight: 133 lb 9.6 oz (60.6 kg)  Height: 5\' 10"  (1.778 m)    This visit occurred during the SARS-CoV-2 public health emergency.  Safety protocols were in place, including screening questions prior to the visit, additional usage of staff PPE, and extensive cleaning of exam room while observing appropriate contact time as indicated for disinfecting solutions.   Assessment & Plan:

## 2020-03-19 ENCOUNTER — Telehealth: Payer: Self-pay | Admitting: Internal Medicine

## 2020-03-19 NOTE — Telephone Encounter (Signed)
Patient is requesting a call back in regards to his recent lab work. He can be reached at [phone].

## 2020-03-19 NOTE — Telephone Encounter (Signed)
These were done yesterday and not reviewed yet.

## 2020-03-20 ENCOUNTER — Encounter: Payer: Self-pay | Admitting: Internal Medicine

## 2020-03-20 NOTE — Assessment & Plan Note (Signed)
Needs PT for strengthening legs to improve balance and stability. Referral to neuro rehab.

## 2020-03-20 NOTE — Assessment & Plan Note (Signed)
Checking labs to make sure no electrolyte imbalances given medications and his lasix.

## 2020-03-20 NOTE — Telephone Encounter (Signed)
    Patient calling for lab results 

## 2020-03-21 NOTE — Telephone Encounter (Signed)
Left detailed mess informing pt of below.   Labs are normal no changes needed.

## 2020-03-28 ENCOUNTER — Encounter: Payer: Self-pay | Admitting: Internal Medicine

## 2020-03-28 ENCOUNTER — Other Ambulatory Visit: Payer: Self-pay

## 2020-03-28 ENCOUNTER — Ambulatory Visit (INDEPENDENT_AMBULATORY_CARE_PROVIDER_SITE_OTHER): Payer: Medicare Other

## 2020-03-28 ENCOUNTER — Telehealth (INDEPENDENT_AMBULATORY_CARE_PROVIDER_SITE_OTHER): Payer: Medicare Other | Admitting: Internal Medicine

## 2020-03-28 ENCOUNTER — Other Ambulatory Visit: Payer: Self-pay | Admitting: Internal Medicine

## 2020-03-28 DIAGNOSIS — I639 Cerebral infarction, unspecified: Secondary | ICD-10-CM | POA: Diagnosis not present

## 2020-03-28 DIAGNOSIS — R0602 Shortness of breath: Secondary | ICD-10-CM | POA: Diagnosis not present

## 2020-03-28 DIAGNOSIS — I5022 Chronic systolic (congestive) heart failure: Secondary | ICD-10-CM

## 2020-03-28 DIAGNOSIS — I10 Essential (primary) hypertension: Secondary | ICD-10-CM | POA: Diagnosis not present

## 2020-03-28 DIAGNOSIS — R059 Cough, unspecified: Secondary | ICD-10-CM | POA: Diagnosis not present

## 2020-03-28 MED ORDER — BUSPIRONE HCL 5 MG PO TABS: 5.0000 mg | ORAL_TABLET | Freq: Two times a day (BID) | ORAL | 0 refills | Status: DC | PRN

## 2020-03-28 NOTE — Progress Notes (Signed)
Virtual Visit via Video Note  I connected with Mark Clayton on 03/28/20 at  1:20 PM EST by a video enabled telemedicine application and verified that I am speaking with the correct person using two identifiers.  The patient and the provider were at separate locations throughout the entire encounter. Patient location: home, Provider location: work   I discussed the limitations of evaluation and management by telemedicine and the availability of in person appointments. The patient expressed understanding and agreed to proceed. The patient and the provider were the only parties present for the visit unless noted in HPI below.  History of Present Illness: The patient is a 79 y.o. man with visit for SOB with standing. When standing to urinate has to sit halfway through. He is now waking up in the night. Gets shallow breathing and feeling like he cannot breathe. Starts coughing too at the same time. Started getting worse in the past month. Has heart failure but last checked he did not have extra fluid. He denies missing fluid pill and weight is stable. They weigh daily. Denies fevers or chills. Overall it is worsening. Has tried nothing except some deep breathing.  Observations/Objective: A and O times 3, no coughing or dyspnea during visit  Assessment and Plan: See problem oriented charting  Follow Up Instructions: x-ray chest ordered  I discussed the assessment and treatment plan with the patient. The patient was provided an opportunity to ask questions and all were answered. The patient agreed with the plan and demonstrated an understanding of the instructions.   The patient was advised to call back or seek an in-person evaluation if the symptoms worsen or if the condition fails to improve as anticipated.  Myrlene Broker, MD   r

## 2020-03-28 NOTE — Assessment & Plan Note (Addendum)
Needs CXR to assess for fluid given night time symptoms with lying flat. Weight is stable but overall appetite returning and weight increasing some. Taking xarelto making PE very unlikely. Has ICD which has not fired making cardiac rhythm unlikely.

## 2020-04-01 ENCOUNTER — Telehealth: Payer: Self-pay | Admitting: Internal Medicine

## 2020-04-01 NOTE — Telephone Encounter (Signed)
Patient calling would like to discuss the results of his xray on 12.30.21 [phone]

## 2020-04-02 ENCOUNTER — Telehealth: Payer: Self-pay | Admitting: Internal Medicine

## 2020-04-02 NOTE — Telephone Encounter (Signed)
   Patient wants clarification on instructions for busPIRone (BUSPAR) 5 MG tablet. He wants to know what is the best way/time to take medications  Patient also has questions about getting new referral for home PT  Patient made aware of notes on chest xray results " clear chest xray"  Please call

## 2020-04-02 NOTE — Telephone Encounter (Signed)
Result note done, are there specific questions?

## 2020-04-03 ENCOUNTER — Encounter: Payer: Self-pay | Admitting: *Deleted

## 2020-04-03 DIAGNOSIS — I69359 Hemiplegia and hemiparesis following cerebral infarction affecting unspecified side: Secondary | ICD-10-CM

## 2020-04-03 NOTE — Telephone Encounter (Signed)
Pt informed of below.  

## 2020-04-03 NOTE — Telephone Encounter (Signed)
I would recommend to try buspar before bedtime to try. Also can take as needed for anxiety. We did referral for PT at last visit per his request they should be getting in touch with him.

## 2020-04-04 ENCOUNTER — Telehealth: Payer: Self-pay | Admitting: Internal Medicine

## 2020-04-04 NOTE — Telephone Encounter (Signed)
Per device team on secure chat, pt is "fluid is creeping up and he is above threshold." I advised pt to take an additional 40mg  lasix today. I will follow up with him in the morning and have him send another remote check. He agrees with plan.

## 2020-04-04 NOTE — Telephone Encounter (Signed)
Called pt and he states he is a little SOB and a few pounds over his baseline weight. He is sending in a remote check for fluid review.   We will contact him back once his transmission is received and reviewed.

## 2020-04-04 NOTE — Telephone Encounter (Signed)
    Pt c/o Shortness Of Breath: STAT if SOB developed within the last 24 hours or pt is noticeably SOB on the phone  1. Are you currently SOB (can you hear that pt is SOB on the phone)? No  2. How long have you been experiencing SOB? Couple of weeks  3. Are you SOB when sitting or when up moving around? moving around  4. Are you currently experiencing any other symptoms? Pt and his granddaughter called, they explained pt been having SOB, they think its from his anxiety, his pcp prescribed anti-anxiety meds but they also wanted to Dr. Graciela Husbands know about his SOB episodes. His granddaughter said SOB lately is getting worst to the point she let him use her inhaler.

## 2020-04-04 NOTE — Telephone Encounter (Signed)
Patient does have increased optivol from transmission received in device clinic 04/04/2020.

## 2020-04-04 NOTE — Telephone Encounter (Signed)
Agree with plan for extra Lasix.  Excellent. Donato Schultz, MD

## 2020-04-05 NOTE — Telephone Encounter (Signed)
Received call from patient. Reviewed updated remote transmission dated 04/05/2020.  He reports 2 lb weight loss and good urine output from extra Lasix yesterday.  Weight this AM 131 lbs.  His breathing is at baseline and he is feeling fine this morning.  Remote transmission fluid levels suggesting fluid accumulation continues with no change in Optivol decreased impedance.  Pt has an appointment with Dr Caryl Comes 04/15/2020  Advised patient to limit salt intake and to take additional Lasix over the weekend if SOB or weight gain returns.  Will recheck fluid levels on 04/08/2020.   04/05/2020 Optiovl impedance   Routed to Dr Marlou Porch, Dollene Primrose, RN and Dr Caryl Comes for [facility].

## 2020-04-08 ENCOUNTER — Ambulatory Visit (INDEPENDENT_AMBULATORY_CARE_PROVIDER_SITE_OTHER): Payer: Medicare Other

## 2020-04-08 DIAGNOSIS — I5022 Chronic systolic (congestive) heart failure: Secondary | ICD-10-CM

## 2020-04-08 DIAGNOSIS — Z95810 Presence of automatic (implantable) cardiac defibrillator: Secondary | ICD-10-CM

## 2020-04-08 NOTE — Progress Notes (Signed)
EPIC Encounter for ICM Monitoring  Patient Name: Mark Clayton is a 80 y.o. male Date: 04/08/2020 Primary Care Physican: [name], MD Cardiologist:[facility] Electrophysiologist: Caryl Comes 1/10/2022Weight: 130lbs  Time in AF:24.0 hr/day (100.0%)(taking Xarelto)   Spoke with patient and reports he has been taking extra Furosemide as ordered by Dr Marlou Porch on 04/04/2020 with good urine output.  Weight and breathing are at baseline.  OptivolThoracic impedancesuggesting fluid levels returned to normal after taking Lasix.  Prescribed:  Furosemide 40 mg 1 tablet daily. Pt taking differently:  Taking every other day.  Potassium 10 mEq take 1 tablet daily  Labs: 10/04/2019 Creatinine 0.86, BUN 8, Potassium 4.9, Sodium 137  07/27/2019 Creatinine1.17, BUN9, Potassium4.9, [id number] 07/03/2019 Creatinine1.14, BUN8, Potassium4.0, Sodium131, GFR>60  A complete set of results can be found in Results Review.  Recommendations: Recommendation to limit salt intake to 2000 mg daily.  Encouraged to call if experiencing any fluid symptoms.   Follow-up plan: ICM clinic phone appointment on2/09/2020.91 day device clinic remote transmission1/26/2022.  EP/Cardiology Office Visits:1/17/2022with Dr.Klein.   Copy of ICM check sent to Dr.Klein and Dr Marlou Porch for [facility].   3 month ICM trend: 04/08/2020.    1 Year ICM trend:       Rosalene Billings, RN 04/08/2020 11:58 AM

## 2020-04-09 NOTE — Telephone Encounter (Signed)
Patient states that [place] [facility] only does in person visits and wont do at home and he would like to stay away from people for his health if possible.

## 2020-04-14 DIAGNOSIS — R001 Bradycardia, unspecified: Secondary | ICD-10-CM | POA: Insufficient documentation

## 2020-04-15 ENCOUNTER — Telehealth: Payer: Self-pay

## 2020-04-15 ENCOUNTER — Encounter: Payer: Self-pay | Admitting: Internal Medicine

## 2020-04-15 ENCOUNTER — Telehealth (INDEPENDENT_AMBULATORY_CARE_PROVIDER_SITE_OTHER): Payer: Medicare Other | Admitting: Internal Medicine

## 2020-04-15 ENCOUNTER — Other Ambulatory Visit: Payer: Self-pay

## 2020-04-15 VITALS — BP 108/69 | HR 76 | Ht 70.0 in | Wt 120.0 lb

## 2020-04-15 DIAGNOSIS — I5022 Chronic systolic (congestive) heart failure: Secondary | ICD-10-CM | POA: Diagnosis not present

## 2020-04-15 DIAGNOSIS — R001 Bradycardia, unspecified: Secondary | ICD-10-CM

## 2020-04-15 DIAGNOSIS — I4819 Other persistent atrial fibrillation: Secondary | ICD-10-CM

## 2020-04-15 DIAGNOSIS — Z95810 Presence of automatic (implantable) cardiac defibrillator: Secondary | ICD-10-CM | POA: Diagnosis not present

## 2020-04-15 DIAGNOSIS — I255 Ischemic cardiomyopathy: Secondary | ICD-10-CM | POA: Diagnosis not present

## 2020-04-15 NOTE — Telephone Encounter (Signed)
Returned call to patient as requested by voice mail message.  He asked if he should send remote transmission for Dr Caryl Comes to review for today's virtual visit.  I advised he can send one and if Dr Caryl Comes needs to look at it for the visit. He says he is feeling fine.  He will send manual remote today for review if needed.

## 2020-04-15 NOTE — Progress Notes (Signed)
Electrophysiology TeleHealth Note   Due to national recommendations of social distancing due to COVID 19, an audio/video telehealth visit is felt to be most appropriate for this patient at this time.  See MyChart message from today for the patient's consent to telehealth for [name].   Date:  04/15/2020   ID:  Mark Clayton, DOB Apr 04, 1940, MRN [id number]  Location: patient's home  Provider location: [address] [place]  Evaluation Performed: Follow-up visit  PCP:  [name], MD  Cardiologist:     Electrophysiologist:  SK   Chief Complaint:  Ischemic cardiomyopathy  Afib and interval stroke   History of Present Illness:    Mark Clayton is a 80 y.o. male who presents via audio/video conferencing for a telehealth visit today.  Since last being seen in our clinic for ICM and ICD with now permanent afib and prior stroke,- the patient reports  Ambulating with walker and cane, out to car-- The patient denies chest pain, nocturnal dyspnea , orthopnea ; intermittentperipheral edema>>lasix taking every other day.Some SOB   There have been no palpitations , lightheadedness*  or syncope  Continuing to work on rehab from stroke   No  bleeding   Date Cr K Hgb  4/21 1.17 4.9 13.1  12/21  0.98 4.1 12.0   DATE TEST EF   8/20* LHC   LIMA-LADp;SVG-OM1-OM2p SVG-RPD 95>>0 RCAm-stent-ISR mild  3/21 Echo <20 % RV dysfunction           The patient denies symptoms of fevers, chills, cough, or new SOB worrisome for COVID 19.   Past Medical History:  Diagnosis Date  . AICD (automatic cardioverter/defibrillator) present 01/17/2003   Medtronic Maximo 7232CX ICD, serial [id number]  . Anemia 02-06-11   takes oral iron  . Arthritis    hands, knees  . CAD (coronary artery disease) 2003   a. h/o MI and CABG in 2003. b. s/p DES to SVG-RPDA-RPLB in 08/2014.  Marland Kitchen Cancer of sigmoid colon ([place]) 2012   a. s/p colon surgery.  . Carotid bruit   . Chronic  systolic CHF (congestive heart failure) (HCC)    a. EF 20% in 2014; b. 08/2017 Echo: EF 20-25%, diff HK, Gr3 DD. Triv AI. Mod MR. Sev dil LA. Mildly dil RV w/ mildly reduced RV fxn. Mildly dil RA. Mod TR. PASP 74mHg.  Marland Kitchen Cough   . Exudative age-related macular degeneration of left eye with active choroidal neovascularization ([place]) 07/12/2019  . Exudative age-related macular degeneration, right eye, with inactive choroidal neovascularization ([place]) 07/12/2019  . GERD (gastroesophageal reflux disease) 02-06-11  . HTN (hypertension)   . Hyperlipidemia   . Ischemic cardiomyopathy    a. EF 20% in 2014. (Master study EF >20%); b. 08/2017 Echo: EF 20-25%, diff HK. Gr3 DD.  . LV (left ventricular) mural thrombus    a. 12/2012 Echo: EF 20% with mural thrombus No evidence of thrombus on 08/2017 echo.  . Macular degeneration   . Myocardial infarct ([place])    2003  . PAF (paroxysmal atrial fibrillation) (HCC)    a. CHA2DS2VASc = 5-->Xarelto/Tikosyn.    Past Surgical History:  Procedure Laterality Date  . CARDIAC CATHETERIZATION N/A 09/20/2014   Procedure: Left Heart Cath and Coronary Angiography;  Surgeon: Jettie Booze, MD; LAD 95%, D1 100%, CFX liner percent, OM 200%, OM 390%, RCA 90%, LIMA-LAD okay, SVG-OM 2-OM 3 minimal disease, SVG-RPDA-RPLB 100% between the RPDA and RPL     . CARDIAC CATHETERIZATION  N/A 09/20/2014   Procedure: Coronary Stent Intervention;  Surgeon: Jettie Booze, MD; Synergy DES 4 x 24 mm reducing the stenosis to 5%   . CARDIAC DEFIBRILLATOR PLACEMENT  01/17/03   6949 lead. Medtronic. remote-no; with later revision  . CARDIOVERSION N/A 05/22/2016   Procedure: CARDIOVERSION;  Surgeon: Lelon Perla, MD;  Location: [facility] ENDOSCOPY;  Service: Cardiovascular;  Laterality: N/A;  . CARDIOVERSION N/A 08/10/2017   Procedure: CARDIOVERSION;  Surgeon: Pixie Casino, MD;  Location[facility] ENDOSCOPY;  Service: Cardiovascular;  Laterality: N/A;  . CATARACT EXTRACTION W/ INTRAOCULAR LENS   IMPLANT, BILATERAL Bilateral June/-July 2009   Dr. Katy Fitch  . CATARACT EXTRACTION W/PHACO Bilateral 2010   Dr. Katy Fitch  . COLON RESECTION  02/09/2011   Procedure: LAPAROSCOPIC SIGMOID COLON RESECTION;  Surgeon: Pedro Earls, MD;  Location: [facility] ORS;  Service: General;  Laterality: N/A;  Laparoscopic Assisted Sigmoid Colectomy  . COLON SURGERY    . COLONOSCOPY  08/31/2011   Procedure: COLONOSCOPY;  Surgeon: Jerene Bears, MD;  Location: [facility] ENDOSCOPY;  Service: Gastroenterology;  Laterality: N/A;  . COLONOSCOPY N/A 09/05/2012   Procedure: COLONOSCOPY;  Surgeon: Jerene Bears, MD;  Location: [facility] ENDOSCOPY;  Service: Gastroenterology;  Laterality: N/A;  . COLONOSCOPY N/A 04/18/2013   Procedure: COLONOSCOPY;  Surgeon: Jerene Bears, MD;  Location: [facility] ENDOSCOPY;  Service: Gastroenterology;  Laterality: N/A;  . COLONOSCOPY N/A 04/09/2014   Procedure: COLONOSCOPY;  Surgeon: Jerene Bears, MD;  Location: [facility] ENDOSCOPY;  Service: Gastroenterology;  Laterality: N/A;  . COLONOSCOPY WITH PROPOFOL N/A 05/03/2017   Procedure: COLONOSCOPY WITH PROPOFOL;  Surgeon: Jerene Bears, MD;  Location: [facility] ENDOSCOPY;  Service: Gastroenterology;  Laterality: N/A;  . CORONARY ANGIOPLASTY    . CORONARY ARTERY BYPASS GRAFT  01/2002   LIMA-LAD, SVG-OM 2-OM 3, SVG-RPDA-RPLB  . CORONARY STENT INTERVENTION N/A 11/22/2018   Procedure: CORONARY STENT INTERVENTION;  Surgeon: Nelva Bush, MD;  Location: [place] CV LAB;  Service: Cardiovascular;  Laterality: N/A;  SVG - RCA  . ICD GENERATOR CHANGE  2010   Medtronic Virtuoso II VR ICD  . ICD GENERATOR CHANGEOUT N/A 12/07/2018   Procedure: ICD GENERATOR CHANGEOUT;  Surgeon: Deboraha Sprang, MD;  Location: [facility] CV LAB;  Service: Cardiovascular;  Laterality: N/A;  . INGUINAL HERNIA REPAIR Right 2000's X 2  . LAPAROSCOPIC RIGHT HEMI COLECTOMY N/A 11/04/2012   Procedure: LAPAROSCOPIC RIGHT HEMI COLECTOMY;  Surgeon: Pedro Earls, MD;  Location: [facility] ORS;  Service: General;  Laterality:  N/A;  . LEFT HEART CATH AND CORS/GRAFTS ANGIOGRAPHY Left 11/22/2018   Procedure: LEFT HEART CATH AND CORS/GRAFTS ANGIOGRAPHY;  Surgeon: Nelva Bush, MD;  Location: [place] CV LAB;  Service: Cardiovascular;  Laterality: Left;  . TONSILLECTOMY  ~ 1950    Current Outpatient Medications  Medication Sig Dispense Refill  . acetaminophen (TYLENOL) 325 MG tablet Take 1-2 tablets (325-650 mg total) by mouth every 4 (four) hours as needed for mild pain.    Marland Kitchen atorvastatin (LIPITOR) 80 MG tablet Take 1 tablet (80 mg total) by mouth daily. Please make yearly appt with Dr. Marlou Porch for January 2022 for future refills. Thank you 1st attempt 90 tablet 0  . busPIRone (BUSPAR) 5 MG tablet Take 1 tablet (5 mg total) by mouth 2 (two) times daily as needed (anxiety, SOB). 60 tablet 0  . carvedilol (COREG) 6.25 MG tablet Take 1 tablet (6.25 mg total) by mouth 2 (two) times daily with a meal. 180 tablet 3  . clopidogrel (  PLAVIX) 75 MG tablet Take 1 tablet (75 mg total) by mouth daily. 90 tablet 1  . ezetimibe (ZETIA) 10 MG tablet TAKE 1 TABLET BY MOUTH  DAILY 30 tablet 0  . furosemide (LASIX) 40 MG tablet Take 40 mg by mouth every other day.    . isosorbide mononitrate (IMDUR) 30 MG 24 hr tablet TAKE 1 TABLET BY MOUTH  DAILY 90 tablet 3  . losartan (COZAAR) 50 MG tablet TAKE 1 TABLET BY MOUTH  DAILY 90 tablet 3  . nitroGLYCERIN (NITROSTAT) 0.4 MG SL tablet DISSOLVE 1 TABLET UNDER THE TONGUE EVERY 5 MINUTES AS  NEEDED FOR CHEST PAIN. MAX  OF 3 TABLETS IN 15 MINUTES. CALL 911 IF PAIN PERSISTS. 100 tablet 3  . pantoprazole (PROTONIX) 40 MG tablet TAKE 1 TABLET BY MOUTH  DAILY 90 tablet 3  . polyethylene glycol (MIRALAX / GLYCOLAX) 17 g packet Take 17 g by mouth as needed.    . potassium chloride (KLOR-CON) 10 MEQ tablet Take 1 tablet (10 mEq total) by mouth daily. 90 tablet 3  . rivaroxaban (XARELTO) 20 MG TABS tablet Take 1 tablet (20 mg total) by mouth daily with supper. 30 tablet 3  . vitamin B-12  (CYANOCOBALAMIN) 100 MCG tablet Take 100 mcg by mouth daily.      No current facility-administered medications for this visit.    Allergies:   Latex and Tape   Social History:  The patient  reports that he has never smoked. He has never used smokeless tobacco. He reports current alcohol use of about 2.0 standard drinks of alcohol per week. He reports that he does not use drugs.   Family History:  The patient's   family history includes Alzheimer's disease in his mother; Heart disease in his father; Hyperlipidemia in his father and sister; Hypertension in his father and sister; Prostate cancer in his father.   ROS:  Please see the history of present illness.   All other systems are personally reviewed and negative.    Exam:    Vital Signs:  BP 108/69   Pulse 76   Ht 5\' 10"  (1.778 m)   Wt 120 lb (54.4 kg)   BMI 17.22 kg/m     Well appearing, alert and conversant, regular work of breathing,  good skin color Eyes- anicteric, neuro- grossly intact, skin- no apparent rash or lesions or cyanosis, mouth- oral mucosa is pink   Labs/Other Tests and Data Reviewed:    Recent Labs: 03/18/2020: ALT 10; BUN 11; Creatinine, Ser 0.98; Hemoglobin 12.0; Magnesium 1.9; Platelets 159.0; Potassium 4.1; Sodium 136   Wt Readings from Last 3 Encounters:  04/15/20 120 lb (54.4 kg)  03/18/20 133 lb 9.6 oz (60.6 kg)  03/04/20 150 lb (68 kg)     Other studies personally reviewed: Additional studies/ records that were reviewed today include:   Last device remote is reviewed from [facility] PDF dated 4/21 which reveals normal device function,   arrhythmias - persistent Afib assoc with marked decrease in activity  OPTIVOL  Downward trend  ASSESSMENT & PLAN:   Ischemic cardiomyopathy  Implantable defibrillator-Medtronic-single chamber-   Hypertension   IVCD  Atrial fibrillation permanent   Congestive heart failure-chronic-systolic  PVCs  Bradycardia  Progressive visual loss,  approaching blindness  No bleeding  improvement post stroke  CHF- some volume overload and being followed by ICM   Not open to daily diuretics With cardiomyopathy have reached out to Dr MS to discuss either the addition of aldactone or entresto for  symptoms and mortality benefit  No ischemia        Follow-up: 54m    Current medicines are reviewed at length with the patient today.   The patient does not have concerns regarding his medicines.  The following changes were made today:  none  Labs/ tests ordered today include: none No orders of the defined types were placed in this encounter.   Future tests ( post COVID )     months  Patient Risk:  after full review of this patients clinical status, I feel that they are at moderate  risk at this time.  Today, I have spent 16 minutes with the patient with telehealth VIDEO  technology discussing the above.   Signed, Virl Axe, MD  04/15/2020 10:57 AM     [facility] [address] [place] [address] [phone] (office) [phone] (fax)

## 2020-04-15 NOTE — Patient Instructions (Signed)
Medication Instructions:  Your physician recommends that you continue on your current medications as directed. Please refer to the Current Medication list given to you today.  *If you need a refill on your cardiac medications before your next appointment, please call your pharmacy*   Lab Work: None ordered.  If you have labs (blood work) drawn today and your tests are completely normal, you will receive your results only by: Marland Kitchen MyChart Message (if you have MyChart) OR . A paper copy in the mail If you have any lab test that is abnormal or we need to change your treatment, we will call you to review the results.   Testing/Procedures: None ordered.    Follow-Up: At [facility], you and your health needs are our priority.  As part of our continuing mission to provide you with exceptional heart care, we have created designated Provider Care Teams.  These Care Teams include your primary Cardiologist (physician) and Advanced Practice Providers (APPs -  Physician Assistants and Nurse Practitioners) who all work together to provide you with the care you need, when you need it.  We recommend signing up for the patient portal called "MyChart".  Sign up information is provided on this After Visit Summary.  MyChart is used to connect with patients for Virtual Visits (Telemedicine).  Patients are able to view lab/test results, encounter notes, upcoming appointments, etc.  Non-urgent messages can be sent to your provider as well.   To learn more about what you can do with MyChart, go to [email or web address].    Your next appointment:   6 month(s)  The format for your next appointment:   In Person  Provider:   Dr Caryl Comes   Other Instructions We will send you a reminder letter when it is time to schedule visit.

## 2020-04-16 ENCOUNTER — Telehealth: Payer: Self-pay | Admitting: Internal Medicine

## 2020-04-16 ENCOUNTER — Other Ambulatory Visit: Payer: Self-pay | Admitting: Physical Medicine and Rehabilitation

## 2020-04-16 ENCOUNTER — Encounter: Payer: Self-pay | Admitting: Cardiology

## 2020-04-16 MED ORDER — RIVAROXABAN 20 MG PO TABS: 20.0000 mg | ORAL_TABLET | Freq: Every day | ORAL | 3 refills | Status: DC

## 2020-04-16 NOTE — Progress Notes (Unsigned)
Let's look into Entresto from losartan.  [employer]

## 2020-04-16 NOTE — Telephone Encounter (Signed)
rivaroxaban (XARELTO) 20 MG TABS tablet Requesting a refill, no longer seen by provider who prescribed it  Clayton, Mark Phone:  [phone]  Fax:  [phone]

## 2020-04-16 NOTE — Telephone Encounter (Signed)
Ok to refill? See below.

## 2020-04-16 NOTE — Telephone Encounter (Signed)
Refill sent in to mail order. Verify if this is not okay I see another drug store listed below.

## 2020-04-17 ENCOUNTER — Telehealth: Payer: Self-pay | Admitting: Internal Medicine

## 2020-04-17 ENCOUNTER — Other Ambulatory Visit: Payer: Self-pay | Admitting: Physical Medicine and Rehabilitation

## 2020-04-17 MED ORDER — RIVAROXABAN 20 MG PO TABS: 20.0000 mg | ORAL_TABLET | Freq: Every day | ORAL | 3 refills | Status: DC

## 2020-04-17 NOTE — Addendum Note (Signed)
Addended by: Thomes Cake on: 04/17/2020 02:20 PM   Modules accepted: Orders

## 2020-04-17 NOTE — Telephone Encounter (Signed)
Patients granddaughter called and was wondering if Dr. Sharlet Salina could write a letter stating that she is the patients care giver so she can give it to the director of her school. She can be reached at [phone].

## 2020-04-17 NOTE — Telephone Encounter (Signed)
Medication has been sent to the pharmacy. Patient is aware.

## 2020-04-17 NOTE — Telephone Encounter (Signed)
[place], [place] Phone:  [phone]  Fax:  [phone]     Patient needs it sent to this pharmacy because he runs out tomorrow

## 2020-04-17 NOTE — Telephone Encounter (Signed)
Ok to write the letter. Please advise

## 2020-04-18 NOTE — Telephone Encounter (Signed)
Patient states he called [facility] and they said he needed a prior auth to fill it

## 2020-04-18 NOTE — Telephone Encounter (Signed)
Patient called and was wondering if a short supply of rivaroxaban (XARELTO) 20 MG TABS tablet could be sent to [place], [place] until the mail order pharmacy could get it to him. He was also wondering if he will be charged for both prescriptions. Please call the patient back at [phone]

## 2020-04-19 NOTE — Telephone Encounter (Signed)
Follow up message   Patient reports he has 3 pills remaining Checking status of prior auth

## 2020-04-22 ENCOUNTER — Ambulatory Visit: Payer: Medicare Other | Admitting: Internal Medicine

## 2020-04-22 NOTE — Telephone Encounter (Signed)
Letter done available on mychart.

## 2020-04-23 ENCOUNTER — Encounter (INDEPENDENT_AMBULATORY_CARE_PROVIDER_SITE_OTHER): Payer: Medicare Other | Admitting: Ophthalmology

## 2020-04-23 NOTE — Telephone Encounter (Signed)
Patient's granddaughter is aware that the letter has been done via mychart. No other questions or concerns at this time.

## 2020-04-24 ENCOUNTER — Telehealth: Payer: Self-pay | Admitting: *Deleted

## 2020-04-24 ENCOUNTER — Telehealth (INDEPENDENT_AMBULATORY_CARE_PROVIDER_SITE_OTHER): Payer: Medicare Other | Admitting: Cardiology

## 2020-04-24 ENCOUNTER — Encounter: Payer: Self-pay | Admitting: Cardiology

## 2020-04-24 ENCOUNTER — Ambulatory Visit (INDEPENDENT_AMBULATORY_CARE_PROVIDER_SITE_OTHER): Payer: Medicare Other

## 2020-04-24 ENCOUNTER — Other Ambulatory Visit: Payer: Self-pay

## 2020-04-24 VITALS — BP 104/65 | HR 84 | Ht 72.0 in | Wt 125.0 lb

## 2020-04-24 DIAGNOSIS — I4819 Other persistent atrial fibrillation: Secondary | ICD-10-CM | POA: Diagnosis not present

## 2020-04-24 DIAGNOSIS — I25119 Atherosclerotic heart disease of native coronary artery with unspecified angina pectoris: Secondary | ICD-10-CM | POA: Diagnosis not present

## 2020-04-24 DIAGNOSIS — I255 Ischemic cardiomyopathy: Secondary | ICD-10-CM

## 2020-04-24 LAB — CUP PACEART REMOTE DEVICE CHECK
Battery Remaining Longevity: 125 mo
Battery Voltage: 3.02 V
Brady Statistic RV Percent Paced: 0.85 %
Date Time Interrogation Session: 20220126033525
HighPow Impedance: 40 Ohm
HighPow Impedance: 50 Ohm
Implantable Lead Implant Date: 20041020
Implantable Lead Implant Date: 20100913
Implantable Lead Location: 753860
Implantable Lead Location: 753860
Implantable Lead Model: 5076
Implantable Lead Model: 6949
Implantable Pulse Generator Implant Date: 20200909
Lead Channel Impedance Value: 342 Ohm
Lead Channel Impedance Value: 418 Ohm
Lead Channel Pacing Threshold Amplitude: 1 V
Lead Channel Pacing Threshold Pulse Width: 0.4 ms
Lead Channel Sensing Intrinsic Amplitude: 25.125 mV
Lead Channel Sensing Intrinsic Amplitude: 25.125 mV
Lead Channel Setting Pacing Amplitude: 2.5 V
Lead Channel Setting Pacing Pulse Width: 0.8 ms
Lead Channel Setting Sensing Sensitivity: 0.45 mV

## 2020-04-24 MED ORDER — SPIRONOLACTONE 25 MG PO TABS: 25.0000 mg | ORAL_TABLET | Freq: Every day | ORAL | 3 refills | Status: DC

## 2020-04-24 MED ORDER — ISOSORBIDE MONONITRATE ER 30 MG PO TB24: 30.0000 mg | ORAL_TABLET | Freq: Every day | ORAL | 3 refills | Status: DC

## 2020-04-24 NOTE — Telephone Encounter (Signed)
  Patient Consent for Virtual Visit         LADARIEN Clayton has provided verbal consent on 04/24/2020 for a virtual visit (video or telephone).   CONSENT FOR VIRTUAL VISIT FOR:  Mark Clayton  By participating in this virtual visit I agree to the following:  I hereby voluntarily request, consent and authorize [facility] and its employed or contracted physicians, physician assistants, nurse practitioners or other licensed health care professionals (the Practitioner), to provide me with telemedicine health care services (the "Services") as deemed necessary by the treating Practitioner. I acknowledge and consent to receive the Services by the Practitioner via telemedicine. I understand that the telemedicine visit will involve communicating with the Practitioner through live audiovisual communication technology and the disclosure of certain medical information by electronic transmission. I acknowledge that I have been given the opportunity to request an in-person assessment or other available alternative prior to the telemedicine visit and am voluntarily participating in the telemedicine visit.  I understand that I have the right to withhold or withdraw my consent to the use of telemedicine in the course of my care at any time, without affecting my right to future care or treatment, and that the Practitioner or I may terminate the telemedicine visit at any time. I understand that I have the right to inspect all information obtained and/or recorded in the course of the telemedicine visit and may receive copies of available information for a reasonable fee.  I understand that some of the potential risks of receiving the Services via telemedicine include:  Marland Kitchen Delay or interruption in medical evaluation due to technological equipment failure or disruption; . Information transmitted may not be sufficient (e.g. poor resolution of images) to allow for appropriate medical decision making by the Practitioner;  and/or  . In rare instances, security protocols could fail, causing a breach of personal health information.  Furthermore, I acknowledge that it is my responsibility to provide information about my medical history, conditions and care that is complete and accurate to the best of my ability. I acknowledge that Practitioner's advice, recommendations, and/or decision may be based on factors not within their control, such as incomplete or inaccurate data provided by me or distortions of diagnostic images or specimens that may result from electronic transmissions. I understand that the practice of medicine is not an exact science and that Practitioner makes no warranties or guarantees regarding treatment outcomes. I acknowledge that a copy of this consent can be made available to me via my patient portal ([facility]), or I can request a printed copy by calling the office of [facility].    I understand that my insurance will be billed for this visit.   I have read or had this consent read to me. . I understand the contents of this consent, which adequately explains the benefits and risks of the Services being provided via telemedicine.  . I have been provided ample opportunity to ask questions regarding this consent and the Services and have had my questions answered to my satisfaction. . I give my informed consent for the services to be provided through the use of telemedicine in my medical care

## 2020-04-24 NOTE — Progress Notes (Signed)
Virtual Visit via Video Note   This visit type was conducted due to national recommendations for restrictions regarding the COVID-19 Pandemic (e.g. social distancing) in an effort to limit this patient's exposure and mitigate transmission in our community.  Due to his co-morbid illnesses, this patient is at least at moderate risk for complications without adequate follow up.  This format is felt to be most appropriate for this patient at this time.  All issues noted in this document were discussed and addressed.  A limited physical exam was performed with this format.  Please refer to the patient's chart for his consent to telehealth for [facility].       Date:  04/24/2020   ID:  [name], DOB 1940-04-27, MRN [id number] The patient was identified using 2 identifiers.  Patient Location: Home Provider Location: Home Office  PCP:  [name], MD  Cardiologist:  Candee Furbish, MD  Electrophysiologist:  Virl Axe, MD   Evaluation Performed:  Follow-Up Visit    History of Present Illness:    Mark Clayton is a 80 y.o. male here for follow-up of ischemic cardiomyopathy with ICD permanent atrial fibrillation prior stroke seen by Dr. Caryl Comes.  In 2020, left heart catheterization showed LIMA to LAD SVG to OM1/OM 2 and SVG to PDA intervened upon.  In 2021, echocardiogram showed EF of less than 20%. RV dysfunction was noted as well.  Dr. Caryl Comes propose that we try Entresto or spironolactone. I wanted to talk to him today about this.  He is quite hard of hearing.  He also is legally blind.  He does get eye injections he states.  Dr. Radene Ou.  His blood pressure is fairly soft at [id number] systolic.  We are going to try spironolactone 12.5.  Need to follow-up with blood work.  He does state that he is going to see Dr. Sharlet Salina his primary care physician on Monday.  The patient does not have symptoms concerning for COVID-19 infection (fever, chills, cough, or new shortness of  breath).    Past Medical History:  Diagnosis Date  . AICD (automatic cardioverter/defibrillator) present 01/17/2003   Medtronic Maximo 7232CX ICD, serial [id number]  . Anemia 02-06-11   takes oral iron  . Arthritis    hands, knees  . CAD (coronary artery disease) 2003   a. h/o MI and CABG in 2003. b. s/p DES to SVG-RPDA-RPLB in 08/2014.  Marland Kitchen Cancer of sigmoid colon ([place]) 2012   a. s/p colon surgery.  . Carotid bruit   . Chronic systolic CHF (congestive heart failure) (HCC)    a. EF 20% in 2014; b. 08/2017 Echo: EF 20-25%, diff HK, Gr3 DD. Triv AI. Mod MR. Sev dil LA. Mildly dil RV w/ mildly reduced RV fxn. Mildly dil RA. Mod TR. PASP 62mHg.  Marland Kitchen Cough   . Exudative age-related macular degeneration of left eye with active choroidal neovascularization ([place]) 07/12/2019  . Exudative age-related macular degeneration, right eye, with inactive choroidal neovascularization ([place]) 07/12/2019  . GERD (gastroesophageal reflux disease) 02-06-11  . HTN (hypertension)   . Hyperlipidemia   . Ischemic cardiomyopathy    a. EF 20% in 2014. (Master study EF >20%); b. 08/2017 Echo: EF 20-25%, diff HK. Gr3 DD.  . LV (left ventricular) mural thrombus    a. 12/2012 Echo: EF 20% with mural thrombus No evidence of thrombus on 08/2017 echo.  . Macular degeneration   . Myocardial infarct ([place])    2003  . PAF (paroxysmal atrial fibrillation) ([place])  a. CHA2DS2VASc = 5-->Xarelto/Tikosyn.   Past Surgical History:  Procedure Laterality Date  . CARDIAC CATHETERIZATION N/A 09/20/2014   Procedure: Left Heart Cath and Coronary Angiography;  Surgeon: Jettie Booze, MD; LAD 95%, D1 100%, CFX liner percent, OM 200%, OM 390%, RCA 90%, LIMA-LAD okay, SVG-OM 2-OM 3 minimal disease, SVG-RPDA-RPLB 100% between the RPDA and RPL     . CARDIAC CATHETERIZATION N/A 09/20/2014   Procedure: Coronary Stent Intervention;  Surgeon: Jettie Booze, MD; Synergy DES 4 x 24 mm reducing the stenosis to 5%   . CARDIAC DEFIBRILLATOR  PLACEMENT  01/17/03   6949 lead. Medtronic. remote-no; with later revision  . CARDIOVERSION N/A 05/22/2016   Procedure: CARDIOVERSION;  Surgeon: Lelon Perla, MD;  [facility];  Service: Cardiovascular;  Laterality: N/A;  . CARDIOVERSION N/A 08/10/2017   Procedure: CARDIOVERSION;  Surgeon: Pixie Casino, MD;  Location: [facility] ENDOSCOPY;  Service: Cardiovascular;  Laterality: N/A;  . CATARACT EXTRACTION W/ INTRAOCULAR LENS  IMPLANT, BILATERAL Bilateral June/-July 2009   Dr. Katy Fitch  . CATARACT EXTRACTION W/PHACO Bilateral 2010   Dr. Katy Fitch  . COLON RESECTION  02/09/2011   Procedure: LAPAROSCOPIC SIGMOID COLON RESECTION;  Surgeon: Pedro Earls, MD;  Location: [facility] ORS;  Service: General;  Laterality: N/A;  Laparoscopic Assisted Sigmoid Colectomy  . COLON SURGERY    . COLONOSCOPY  08/31/2011   Procedure: COLONOSCOPY;  Surgeon: Jerene Bears, MD;  Location: [facility] ENDOSCOPY;  Service: Gastroenterology;  Laterality: N/A;  . COLONOSCOPY N/A 09/05/2012   Procedure: COLONOSCOPY;  Surgeon: Jerene Bears, MD;  Location: [facility] ENDOSCOPY;  Service: Gastroenterology;  Laterality: N/A;  . COLONOSCOPY N/A 04/18/2013   Procedure: COLONOSCOPY;  Surgeon: Jerene Bears, MD;  Location: [facility] ENDOSCOPY;  Service: Gastroenterology;  Laterality: N/A;  . COLONOSCOPY N/A 04/09/2014   Procedure: COLONOSCOPY;  Surgeon: Jerene Bears, MD;  Location: [facility] ENDOSCOPY;  Service: Gastroenterology;  Laterality: N/A;  . COLONOSCOPY WITH PROPOFOL N/A 05/03/2017   Procedure: COLONOSCOPY WITH PROPOFOL;  Surgeon: Jerene Bears, MD;  Location: [facility] ENDOSCOPY;  Service: Gastroenterology;  Laterality: N/A;  . CORONARY ANGIOPLASTY    . CORONARY ARTERY BYPASS GRAFT  01/2002   LIMA-LAD, SVG-OM 2-OM 3, SVG-RPDA-RPLB  . CORONARY STENT INTERVENTION N/A 11/22/2018   Procedure: CORONARY STENT INTERVENTION;  Surgeon: Nelva Bush, MD;  Location: [facility] CV LAB;  Service: Cardiovascular;  Laterality: N/A;  SVG - RCA  . ICD GENERATOR CHANGE  2010    Medtronic Virtuoso II VR ICD  . ICD GENERATOR CHANGEOUT N/A 12/07/2018   Procedure: ICD GENERATOR CHANGEOUT;  Surgeon: Deboraha Sprang, MD;  Location: [place] CV LAB;  Service: Cardiovascular;  Laterality: N/A;  . INGUINAL HERNIA REPAIR Right 2000's X 2  . LAPAROSCOPIC RIGHT HEMI COLECTOMY N/A 11/04/2012   Procedure: LAPAROSCOPIC RIGHT HEMI COLECTOMY;  Surgeon: Pedro Earls, MD;  Location: [facility] ORS;  Service: General;  Laterality: N/A;  . LEFT HEART CATH AND CORS/GRAFTS ANGIOGRAPHY Left 11/22/2018   Procedure: LEFT HEART CATH AND CORS/GRAFTS ANGIOGRAPHY;  Surgeon: Nelva Bush, MD;  Location: [place] CV LAB;  Service: Cardiovascular;  Laterality: Left;  . TONSILLECTOMY  ~ 1950     Current Meds  Medication Sig  . acetaminophen (TYLENOL) 325 MG tablet Take 1-2 tablets (325-650 mg total) by mouth every 4 (four) hours as needed for mild pain.  Marland Kitchen atorvastatin (LIPITOR) 80 MG tablet Take 1 tablet (80 mg total) by mouth daily. Please make yearly appt with Dr. Marlou Porch for January 2022  for future refills. Thank you 1st attempt  . carvedilol (COREG) 6.25 MG tablet Take 1 tablet (6.25 mg total) by mouth 2 (two) times daily with a meal.  . clopidogrel (PLAVIX) 75 MG tablet Take 1 tablet (75 mg total) by mouth daily.  Marland Kitchen ezetimibe (ZETIA) 10 MG tablet TAKE 1 TABLET BY MOUTH  DAILY  . furosemide (LASIX) 40 MG tablet Take 40 mg by mouth every other day.  . losartan (COZAAR) 50 MG tablet TAKE 1 TABLET BY MOUTH  DAILY  . nitroGLYCERIN (NITROSTAT) 0.4 MG SL tablet DISSOLVE 1 TABLET UNDER THE TONGUE EVERY 5 MINUTES AS  NEEDED FOR CHEST PAIN. MAX  OF 3 TABLETS IN 15 MINUTES. CALL 911 IF PAIN PERSISTS.  Marland Kitchen pantoprazole (PROTONIX) 40 MG tablet TAKE 1 TABLET BY MOUTH  DAILY  . polyethylene glycol (MIRALAX / GLYCOLAX) 17 g packet Take 17 g by mouth as needed.  . potassium chloride (KLOR-CON) 10 MEQ tablet Take 1 tablet (10 mEq total) by mouth daily.  . rivaroxaban (XARELTO) 20 MG TABS tablet Take 1 tablet  (20 mg total) by mouth daily with supper.  Marland Kitchen spironolactone (ALDACTONE) 25 MG tablet Take 1 tablet (25 mg total) by mouth daily.  . vitamin B-12 (CYANOCOBALAMIN) 100 MCG tablet Take 100 mcg by mouth daily.   . [DISCONTINUED] isosorbide mononitrate (IMDUR) 30 MG 24 hr tablet TAKE 1 TABLET BY MOUTH  DAILY     Allergies:   Latex and Tape   Social History   Tobacco Use  . Smoking status: Never Smoker  . Smokeless tobacco: Never Used  Vaping Use  . Vaping Use: Never used  Substance Use Topics  . Alcohol use: Yes    Alcohol/week: 2.0 standard drinks    Types: 2 Cans of beer per week  . Drug use: No     Family Hx: The patient's family history includes Alzheimer's disease in his mother; Heart disease in his father; Hyperlipidemia in his father and sister; Hypertension in his father and sister; Prostate cancer in his father. There is no history of Colon cancer, Esophageal cancer, Rectal cancer, or Stomach cancer.  ROS:   Please see the history of present illness.    No syncope no bleeding. All other systems reviewed and are negative.   Prior CV studies:   The following studies were reviewed today:  Cardiac catheterization 11/22/2018: Conclusions: 1. Multivessel coronary artery disease, as outlined below.  Culprit lesion for the patient's unstable angina is most likely the anastomotic lesion at the SVG to rPDA.  Otherwise, coronary and graft disease appears stable from prior catheterization in 2016. 2. Patent mid RCA stent with mild focal in-stent restenosis. 3. Widely patent LIMA to LAD. 4. Widely patent sequential SVG to ramus intermedius, OM2, and OM3. 5. Patent sequential SVG to RPDA with 95% focal stenosis at the side to side anastomosis of graft to rPDA.  Jump portion to rPL remains chronically occluded. 6. Moderately elevated left ventricular filling pressure. 7. Successful PCI to SVG to RPDA using Resolute Onyx 2.75 x 26 mm drug-eluting stent with 0% residual stenosis and  TIMI-3 flow.    Labs/Other Tests and Data Reviewed:    EKG: 08/22/2019: Atrial fibrillation interventricular conduction delay  Recent Labs: 03/18/2020: ALT 10; BUN 11; Creatinine, Ser 0.98; Hemoglobin 12.0; Magnesium 1.9; Platelets 159.0; Potassium 4.1; Sodium 136   Recent Lipid Panel Lab Results  Component Value Date/Time   CHOL 121 06/07/2019 05:40 AM   CHOL 182 11/16/2018 09:00 AM   TRIG 112  06/07/2019 05:40 AM   HDL 39 (L) 06/07/2019 05:40 AM   HDL 56 11/16/2018 09:00 AM   CHOLHDL 3.1 06/07/2019 05:40 AM   LDLCALC 60 06/07/2019 05:40 AM   LDLCALC 102 (H) 11/16/2018 09:00 AM   LDLDIRECT 104 (H) 03/28/2019 10:18 AM    Wt Readings from Last 3 Encounters:  04/24/20 125 lb (56.7 kg)  04/15/20 120 lb (54.4 kg)  03/18/20 133 lb 9.6 oz (60.6 kg)     Risk Assessment/Calculations:      Objective:    Vital Signs:  BP 104/65   Pulse 84   Ht 6' (1.829 m)   Wt 125 lb (56.7 kg)   SpO2 97%   BMI 16.95 kg/m    VITAL SIGNS:  reviewed GEN:  no acute distress EYES:  sclerae anicteric, EOMI - Extraocular Movements Intact RESPIRATORY:  normal respiratory effort, symmetric expansion SKIN:  no rash, lesions or ulcers. MUSCULOSKELETAL:  no obvious deformities. NEURO:  alert and oriented x 3, no obvious focal deficit PSYCH:  normal affect  ASSESSMENT & PLAN:    CAD CABG  - stable -No anginal symptoms.  Prior PCI to SVG to PDA  ICM  -New start sprironolactone 12.5 mg once a day.  Blood pressure fairly soft at 096 systolic.  I do not want to try [facility] because of the fear of lowering his blood pressure too much.  His BMI currently is 16.9. -We will check basic metabolic profile in 1 week after starting spironolactone 12.5 mg.  This will be in addition to his other medications.  Neurohormonal modulator   Persistent atrial fibrillation -Continue with Xarelto.  No bleeding side effects.  Trouble with cost.  Being referred to [place] [facility].  COVID-19 Education: The signs and symptoms  of COVID-19 were discussed with the patient and how to seek care for testing (follow up with PCP or arrange E-visit).  The importance of social distancing was discussed today.  Time:   Today, I have spent 21 minutes with the patient with telehealth technology discussing the above problems.     Medication Adjustments/Labs and Tests Ordered: Current medicines are reviewed at length with the patient today.  Concerns regarding medicines are outlined above.   Tests Ordered: Orders Placed This Encounter  Procedures  . Basic metabolic panel    Medication Changes: Meds ordered this encounter  Medications  . isosorbide mononitrate (IMDUR) 30 MG 24 hr tablet    Sig: Take 1 tablet (30 mg total) by mouth daily.    Dispense:  90 tablet    Refill:  3  . spironolactone (ALDACTONE) 25 MG tablet    Sig: Take 1 tablet (25 mg total) by mouth daily.    Dispense:  90 tablet    Refill:  3    Follow Up:  In Person in 3 month(s)  Signed, Candee Furbish, MD  04/24/2020 5:23 PM    [place]

## 2020-04-24 NOTE — Patient Instructions (Signed)
Medication Instructions:  Your physician has recommended you make the following change in your medication:  1) START taking spironolactone 12.5mg  daily  *If you need a refill on your cardiac medications before your next appointment, please call your pharmacy*   Lab Work: BMET one week after starting spironolactone  Follow-Up: At [facility], you and your health needs are our priority.  As part of our continuing mission to provide you with exceptional heart care, we have created designated Provider Care Teams.  These Care Teams include your primary Cardiologist (physician) and Advanced Practice Providers (APPs -  Physician Assistants and Nurse Practitioners) who all work together to provide you with the care you need, when you need it.  Your next appointment:   3 month(s)  The format for your next appointment:   In Person  Provider:   You may see Candee Furbish, MD or one of the following Advanced Practice Providers on your designated Care Team:    Kathyrn Drown, NP

## 2020-04-25 ENCOUNTER — Ambulatory Visit: Payer: Medicare Other | Admitting: Internal Medicine

## 2020-04-25 ENCOUNTER — Telehealth: Payer: Self-pay

## 2020-04-25 MED ORDER — RIVAROXABAN 20 MG PO TABS: 20.0000 mg | ORAL_TABLET | Freq: Every day | ORAL | 3 refills | Status: DC

## 2020-04-25 NOTE — Addendum Note (Signed)
Addended by: Dennie Fetters on: 04/25/2020 10:10 AM   Modules accepted: Orders

## 2020-04-25 NOTE — Telephone Encounter (Signed)
**Note De-identified  Obfuscation** -----  **Note De-Identified  Obfuscation** Message from Tamsen Snider sent at 04/24/2020  3:52 PM EST ----- Hope all is well Jeani Hawking,  This pt is having problems with xarelto going back and forth between mail order and pharmacy.  Taking money out of his account, putting money back.  Very confusing.  S/w granddaughter [name] and stated that you would call pt and see if you can assist. I have never heard of such a thing.  Miss seeing your beautiful face.  Warm regards,  Danielle  Ps. This is a Dr. Marlou Porch pt.

## 2020-04-25 NOTE — Telephone Encounter (Signed)
Prior authorization is not needed due to the medication being covered by patient's insurance.

## 2020-04-25 NOTE — Telephone Encounter (Addendum)
**Note De-Identified  Obfuscation** I s/w [name] who states that [facility] cancelled the pts Xarelto refill but she is unsure why as the pts PCP sent his Xarelto RX to [place] and they filled it for a 30 day supply without an issue. I advised [name] that I am going to attempt a Xarelto PA and will call her with an update when available. She thanked me for calling them back.

## 2020-04-25 NOTE — Telephone Encounter (Signed)
**Note De-Identified  Obfuscation** I did a Xarelto PA through covermymeds and received the following message: Mark Clayton Key: [id number] - PA Case ID: [id number] Outcome: This medication or product is on your plan's list of covered drugs. Prior authorization is not required at this time. If your pharmacy has questions regarding the processing of your prescription, please have them call the [facility] pharmacy help desk at [phone]. **Please note: Formulary lowering, tiering exception, cost reduction and/or pre-benefit determination review (including prospective Medicare hospice reviews) requests cannot be requested using this method of submission. Please contact us at [phone] instead. Drug: Xarelto 20MG  tablets Form: [facility] Medicare Part D Electronic Prior Authorization Form (2017 NCPDP)  I have contacted [place] and made her aware that a PA is not required for the pts Xarelto and that I have e-scribed a Xarelto refill to [facility] to fill. I did advise that she will need to call [facility] in the future if they cancel his refill. She verbalized understanding and thanked me for the update.

## 2020-04-25 NOTE — Telephone Encounter (Signed)
-----   Message from Danielle R Gonzalez sent at 04/24/2020  3:52 PM EST ----- Hope all is well Lynn,  This pt is having problems with xarelto going back and forth between mail order and pharmacy.  Taking money out of his account, putting money back.  Very confusing.  S/w granddaughter [name] and stated that you would call pt and see if you can assist. I have never heard of such a thing.  Miss seeing your beautiful face.  Warm regards,  Danielle  Ps. This is a Dr. Skains pt.   

## 2020-04-25 NOTE — Telephone Encounter (Signed)
Encounter not needed

## 2020-04-25 NOTE — Telephone Encounter (Signed)
Patients daughter calling, states they have been trying to get this medication through his cardiology office but because we currently have an prior auth pending they are not able to transfer it to the cardiology office she states. She is wondering if we can cancel the prior auth so cardiology can take over.

## 2020-04-30 ENCOUNTER — Encounter (INDEPENDENT_AMBULATORY_CARE_PROVIDER_SITE_OTHER): Payer: Self-pay | Admitting: Ophthalmology

## 2020-04-30 ENCOUNTER — Ambulatory Visit (INDEPENDENT_AMBULATORY_CARE_PROVIDER_SITE_OTHER): Payer: Medicare Other | Admitting: Ophthalmology

## 2020-04-30 ENCOUNTER — Other Ambulatory Visit: Payer: Self-pay

## 2020-04-30 DIAGNOSIS — H353211 Exudative age-related macular degeneration, right eye, with active choroidal neovascularization: Secondary | ICD-10-CM

## 2020-04-30 MED ORDER — AFLIBERCEPT 2MG/0.05ML IZ SOLN FOR KALEIDOSCOPE
2.0000 mg | INTRAVITREAL | Status: AC | PRN
  Administered 2020-04-30: 2 mg via INTRAVITREAL

## 2020-04-30 NOTE — Assessment & Plan Note (Signed)
Stable at 11-week follow-up, disciform scar, with less active edges, on intravitreal Eylea.  Repeat today and examination again in 3 months

## 2020-04-30 NOTE — Patient Instructions (Signed)
Patient instructed to contact the office promptly for new onset visual acuity declines or distortions 

## 2020-04-30 NOTE — Progress Notes (Signed)
04/30/2020     CHIEF COMPLAINT Patient presents for Retina Follow Up (11 Week Wet AMD f\u OD. Possible Eylea OD. OCT/Pt states vision has decreased since last visit. Pt states he has trouble reading larger print now.)   HISTORY OF PRESENT ILLNESS: Mark Clayton is a 80 y.o. male who presents to the clinic today for:   HPI    Retina Follow Up    Patient presents with  Wet AMD.  In right eye.  Severity is moderate.  Duration of 11 weeks.  Since onset it is stable.  I, the attending physician,  performed the HPI with the patient and updated documentation appropriately. Additional comments: 11 Week Wet AMD f\u OD. Possible Eylea OD. OCT Pt states vision has decreased since last visit. Pt states he has trouble reading larger print now.       Last edited by Tilda Franco on 04/30/2020  9:41 AM. (History)      Referring physician: [name], MD [place],  [place] [address]  HISTORICAL INFORMATION:   Selected notes from the MEDICAL RECORD NUMBER    Lab Results  Component Value Date   HGBA1C 6.1 (H) 06/07/2019     CURRENT MEDICATIONS: No current outpatient medications on file. (Ophthalmic Drugs)   No current facility-administered medications for this visit. (Ophthalmic Drugs)   Current Outpatient Medications (Other)  Medication Sig  . acetaminophen (TYLENOL) 325 MG tablet Take 1-2 tablets (325-650 mg total) by mouth every 4 (four) hours as needed for mild pain.  Marland Kitchen atorvastatin (LIPITOR) 80 MG tablet Take 1 tablet (80 mg total) by mouth daily. Please make yearly appt with Dr. Marlou Porch for January 2022 for future refills. Thank you 1st attempt  . carvedilol (COREG) 6.25 MG tablet Take 1 tablet (6.25 mg total) by mouth 2 (two) times daily with a meal.  . clopidogrel (PLAVIX) 75 MG tablet Take 1 tablet (75 mg total) by mouth daily.  Marland Kitchen ezetimibe (ZETIA) 10 MG tablet TAKE 1 TABLET BY MOUTH  DAILY  . furosemide (LASIX) 40 MG tablet Take 40 mg by mouth every  other day.  . isosorbide mononitrate (IMDUR) 30 MG 24 hr tablet Take 1 tablet (30 mg total) by mouth daily.  Marland Kitchen losartan (COZAAR) 50 MG tablet TAKE 1 TABLET BY MOUTH  DAILY  . nitroGLYCERIN (NITROSTAT) 0.4 MG SL tablet DISSOLVE 1 TABLET UNDER THE TONGUE EVERY 5 MINUTES AS  NEEDED FOR CHEST PAIN. MAX  OF 3 TABLETS IN 15 MINUTES. CALL 911 IF PAIN PERSISTS.  Marland Kitchen pantoprazole (PROTONIX) 40 MG tablet TAKE 1 TABLET BY MOUTH  DAILY  . polyethylene glycol (MIRALAX / GLYCOLAX) 17 g packet Take 17 g by mouth as needed.  . potassium chloride (KLOR-CON) 10 MEQ tablet Take 1 tablet (10 mEq total) by mouth daily.  . rivaroxaban (XARELTO) 20 MG TABS tablet Take 1 tablet (20 mg total) by mouth daily with supper.  Marland Kitchen spironolactone (ALDACTONE) 25 MG tablet Take 1 tablet (25 mg total) by mouth daily.  . vitamin B-12 (CYANOCOBALAMIN) 100 MCG tablet Take 100 mcg by mouth daily.    No current facility-administered medications for this visit. (Other)      REVIEW OF SYSTEMS:    ALLERGIES Allergies  Allergen Reactions  . Latex Rash  . Tape Rash and Other (See Comments)    USE PAPER    PAST MEDICAL HISTORY Past Medical History:  Diagnosis Date  . AICD (automatic cardioverter/defibrillator) present 01/17/2003   Medtronic Maximo  7232CX ICD, serial [id number]  . Anemia 02-06-11   takes oral iron  . Arthritis    hands, knees  . CAD (coronary artery disease) 2003   a. h/o MI and CABG in 2003. b. s/p DES to SVG-RPDA-RPLB in 08/2014.  Marland Kitchen Cancer of sigmoid colon ([place]) 2012   a. s/p colon surgery.  . Carotid bruit   . Chronic systolic CHF (congestive heart failure) (HCC)    a. EF 20% in 2014; b. 08/2017 Echo: EF 20-25%, diff HK, Gr3 DD. Triv AI. Mod MR. Sev dil LA. Mildly dil RV w/ mildly reduced RV fxn. Mildly dil RA. Mod TR. PASP 12mHg.  Marland Kitchen Cough   . Exudative age-related macular degeneration of left eye with active choroidal neovascularization ([place]) 07/12/2019  . Exudative age-related macular degeneration,  right eye, with inactive choroidal neovascularization ([place]) 07/12/2019  . GERD (gastroesophageal reflux disease) 02-06-11  . HTN (hypertension)   . Hyperlipidemia   . Ischemic cardiomyopathy    a. EF 20% in 2014. (Master study EF >20%); b. 08/2017 Echo: EF 20-25%, diff HK. Gr3 DD.  . LV (left ventricular) mural thrombus    a. 12/2012 Echo: EF 20% with mural thrombus No evidence of thrombus on 08/2017 echo.  . Macular degeneration   . Myocardial infarct ([place])    2003  . PAF (paroxysmal atrial fibrillation) (HCC)    a. CHA2DS2VASc = 5-->Xarelto/Tikosyn.   Past Surgical History:  Procedure Laterality Date  . CARDIAC CATHETERIZATION N/A 09/20/2014   Procedure: Left Heart Cath and Coronary Angiography;  Surgeon: Jettie Booze, MD; LAD 95%, D1 100%, CFX liner percent, OM 200%, OM 390%, RCA 90%, LIMA-LAD okay, SVG-OM 2-OM 3 minimal disease, SVG-RPDA-RPLB 100% between the RPDA and RPL     . CARDIAC CATHETERIZATION N/A 09/20/2014   Procedure: Coronary Stent Intervention;  Surgeon: Jettie Booze, MD; Synergy DES 4 x 24 mm reducing the stenosis to 5%   . CARDIAC DEFIBRILLATOR PLACEMENT  01/17/03   6949 lead. Medtronic. remote-no; with later revision  . CARDIOVERSION N/A 05/22/2016   Procedure: CARDIOVERSION;  Surgeon: Lelon Perla, MD;  Location: [facility] ENDOSCOPY;  Service: Cardiovascular;  Laterality: N/A;  . CARDIOVERSION N/A 08/10/2017   Procedure: CARDIOVERSION;  Surgeon: Pixie Casino, MD;  Location: [facility] ENDOSCOPY;  Service: Cardiovascular;  Laterality: N/A;  . CATARACT EXTRACTION W/ INTRAOCULAR LENS  IMPLANT, BILATERAL Bilateral June/-July 2009   Dr. Katy Fitch  . CATARACT EXTRACTION W/PHACO Bilateral 2010   Dr. Katy Fitch  . COLON RESECTION  02/09/2011   Procedure: LAPAROSCOPIC SIGMOID COLON RESECTION;  Surgeon: Pedro Earls, MD;  Location: [facility] ORS;  Service: General;  Laterality: N/A;  Laparoscopic Assisted Sigmoid Colectomy  . COLON SURGERY    . COLONOSCOPY  08/31/2011   Procedure:  COLONOSCOPY;  Surgeon: Jerene Bears, MD;  Location: [facility] ENDOSCOPY;  Service: Gastroenterology;  Laterality: N/A;  . COLONOSCOPY N/A 09/05/2012   Procedure: COLONOSCOPY;  Surgeon: Jerene Bears, MD;  Location: [facility] ENDOSCOPY;  Service: Gastroenterology;  Laterality: N/A;  . COLONOSCOPY N/A 04/18/2013   Procedure: COLONOSCOPY;  Surgeon: Jerene Bears, MD;  Location: [facility] ENDOSCOPY;  Service: Gastroenterology;  Laterality: N/A;  . COLONOSCOPY N/A 04/09/2014   Procedure: COLONOSCOPY;  Surgeon: Jerene Bears, MD;  Location: [facility] ENDOSCOPY;  Service: Gastroenterology;  Laterality: N/A;  . COLONOSCOPY WITH PROPOFOL N/A 05/03/2017   Procedure: COLONOSCOPY WITH PROPOFOL;  Surgeon: Jerene Bears, MD;  Location: [facility] ENDOSCOPY;  Service: Gastroenterology;  Laterality: N/A;  . CORONARY ANGIOPLASTY    .  CORONARY ARTERY BYPASS GRAFT  01/2002   LIMA-LAD, SVG-OM 2-OM 3, SVG-RPDA-RPLB  . CORONARY STENT INTERVENTION N/A 11/22/2018   Procedure: CORONARY STENT INTERVENTION;  Surgeon: Yvonne Kendall, MD;  Location: [facility] INVASIVE CV LAB;  Service: Cardiovascular;  Laterality: N/A;  SVG - RCA  . ICD GENERATOR CHANGE  2010   Medtronic Virtuoso II VR ICD  . ICD GENERATOR CHANGEOUT N/A 12/07/2018   Procedure: ICD GENERATOR CHANGEOUT;  Surgeon: Duke Salvia, MD;  Location: [facility] INVASIVE CV LAB;  Service: Cardiovascular;  Laterality: N/A;  . INGUINAL HERNIA REPAIR Right 2000's X 2  . LAPAROSCOPIC RIGHT HEMI COLECTOMY N/A 11/04/2012   Procedure: LAPAROSCOPIC RIGHT HEMI COLECTOMY;  Surgeon: Valarie Merino, MD;  Location: [facility] ORS;  Service: General;  Laterality: N/A;  . LEFT HEART CATH AND CORS/GRAFTS ANGIOGRAPHY Left 11/22/2018   Procedure: LEFT HEART CATH AND CORS/GRAFTS ANGIOGRAPHY;  Surgeon: Yvonne Kendall, MD;  Location: [facility] INVASIVE CV LAB;  Service: Cardiovascular;  Laterality: Left;  . TONSILLECTOMY  ~ 1950    FAMILY HISTORY Family History  Problem Relation Age of Onset  . Hypertension Father   . Hyperlipidemia Father   .  Heart disease Father   . Prostate cancer Father   . Alzheimer's disease Mother   . Hypertension Sister   . Hyperlipidemia Sister   . Colon cancer Neg Hx   . Esophageal cancer Neg Hx   . Rectal cancer Neg Hx   . Stomach cancer Neg Hx     SOCIAL HISTORY Social History   Tobacco Use  . Smoking status: Never Smoker  . Smokeless tobacco: Never Used  Vaping Use  . Vaping Use: Never used  Substance Use Topics  . Alcohol use: Yes    Alcohol/week: 2.0 standard drinks    Types: 2 Cans of beer per week  . Drug use: No         OPHTHALMIC EXAM:  Base Eye Exam    Visual Acuity (Snellen - Linear)      Right Left   Dist [place] 20/200 -1 CF @ 3'   Dist ph [place] NI NI       Tonometry (Tonopen, 9:45 AM)      Right Left   Pressure 11 12       Pupils      Pupils Dark Light Shape React APD   Right PERRL 2 2 Round Minimal None   Left PERRL 2 2 Round Minimal None       Visual Fields (Counting fingers)      Left Right    Full Full       Neuro/Psych    Oriented x3: Yes   Mood/Affect: Normal       Dilation    Right eye: 1.0% Mydriacyl, 2.5% Phenylephrine @ 9:45 AM        Slit Lamp and Fundus Exam    External Exam      Right Left   External Normal Normal       Slit Lamp Exam      Right Left   Lids/Lashes Normal Normal   Conjunctiva/Sclera White and quiet White and quiet   Cornea Clear Clear   Anterior Chamber Deep and quiet Deep and quiet   Iris Round and reactive Round and reactive   Lens Posterior chamber intraocular lens Posterior chamber intraocular lens   Anterior Vitreous Normal Normal       Fundus Exam      Right Left   Posterior Vitreous Normal  Disc Normal    C/D Ratio 0.25    Macula Hard drusen, Advanced age related macular degeneration, Retinal pigment epithelial atrophy, Macular thickening, Subretinal neovascular membrane fibrotic    Vessels Normal    Periphery Normal           IMAGING AND PROCEDURES  Imaging and Procedures for  04/30/20  OCT, Retina - OU - Both Eyes       Right Eye Quality was good. Scan locations included subfoveal. Central Foveal Thickness: 338. Progression has been stable. Findings include abnormal foveal contour, disciform scar, cystoid macular edema.   Left Eye Quality was good. Scan locations included subfoveal. Central Foveal Thickness: 430. Progression has worsened. Findings include abnormal foveal contour.   Notes Disciform macular scar right eye centrally, with edges less active with less intraretinal fluid and CME.  Will continue with therapy today repeat Eylea OD and extend interval examination to 12 weeks to monitor and prevent scotoma enlargement with treatment as necessary       Intravitreal Injection, Pharmacologic Agent - OD - Right Eye       Time Out 04/30/2020. 10:12 AM. Confirmed correct patient, procedure, site, and patient consented.   Anesthesia Topical anesthesia was used. Anesthetic medications included [place] 3.5%.   Procedure Preparation included Ofloxacin , Tobramycin 0.3%, 10% betadine to eyelids, 5% betadine to ocular surface. A 30 gauge needle was used.   Injection:  2 mg aflibercept Alfonse Flavors) SOLN   NDC: [id number], Lot: [id number]   Route: Intravitreal, Site: Right Eye, Waste: 0 mg  Post-op Post injection exam found visual acuity of at least counting fingers. The patient tolerated the procedure well. There were no complications. The patient received written and verbal post procedure care education. Post injection medications were not given.                 ASSESSMENT/PLAN:  Exudative age-related macular degeneration of right eye with active choroidal neovascularization (HCC) Stable at 11-week follow-up, disciform scar, with less active edges, on intravitreal Eylea.  Repeat today and examination again in 3 months      ICD-10-CM   1. Exudative age-related macular degeneration of right eye with active choroidal neovascularization (HCC)   H35.3211 OCT, Retina - OU - Both Eyes    Intravitreal Injection, Pharmacologic Agent - OD - Right Eye    aflibercept (EYLEA) SOLN 2 mg    1.  Persistent perimacular active CNVM, disciform scar limits acuity.  Currently 11-week follow-up.  We will repeat injection today and extend interval examination to 3 months  2.  Goal is to prevent enlargement of scotoma of the right eye.  3.  Ophthalmic Meds Ordered this visit:  Meds ordered this encounter  Medications  . aflibercept (EYLEA) SOLN 2 mg       Return in about 3 months (around 07/28/2020) for DILATE OU, EYLEA OCT, OD.  Patient Instructions  Patient instructed to contact the office promptly for new onset visual acuity declines or distortions    Explained the diagnoses, plan, and follow up with the patient and they expressed understanding.  Patient expressed understanding of the importance of proper follow up care.   Clent Demark Tamyrah Burbage M.D. Diseases & Surgery of the Retina and Vitreous [facility] [place] 04/30/20     Abbreviations: M myopia (nearsighted); A astigmatism; H hyperopia (farsighted); P presbyopia; Mrx spectacle prescription;  CTL contact lenses; OD right eye; OS left eye; OU both eyes  XT exotropia; ET esotropia; PEK punctate epithelial keratitis; PEE punctate  epithelial erosions; DES dry eye syndrome; MGD meibomian gland dysfunction; ATs artificial tears; PFAT's preservative free artificial tears; [place] nuclear sclerotic cataract; PSC posterior subcapsular cataract; ERM epi-retinal membrane; PVD posterior vitreous detachment; RD retinal detachment; DM diabetes mellitus; DR diabetic retinopathy; NPDR non-proliferative diabetic retinopathy; PDR proliferative diabetic retinopathy; CSME clinically significant macular edema; DME diabetic macular edema; dbh dot blot hemorrhages; CWS cotton wool spot; POAG primary open angle glaucoma; C/D cup-to-disc ratio; HVF humphrey visual field; GVF goldmann visual field; OCT  optical coherence tomography; IOP intraocular pressure; BRVO Branch retinal vein occlusion; CRVO central retinal vein occlusion; CRAO central retinal artery occlusion; BRAO branch retinal artery occlusion; RT retinal tear; SB scleral buckle; PPV pars plana vitrectomy; VH Vitreous hemorrhage; PRP panretinal laser photocoagulation; IVK intravitreal kenalog; VMT vitreomacular traction; MH Macular hole;  NVD neovascularization of the disc; NVE neovascularization elsewhere; AREDS age related eye disease study; ARMD age related macular degeneration; POAG primary open angle glaucoma; EBMD epithelial/anterior basement membrane dystrophy; ACIOL anterior chamber intraocular lens; IOL intraocular lens; PCIOL posterior chamber intraocular lens; Phaco/IOL phacoemulsification with intraocular lens placement; [place] photorefractive keratectomy; LASIK laser assisted in situ keratomileusis; HTN hypertension; DM diabetes mellitus; COPD chronic obstructive pulmonary disease

## 2020-05-02 ENCOUNTER — Other Ambulatory Visit: Payer: Self-pay | Admitting: Cardiology

## 2020-05-03 ENCOUNTER — Telehealth: Payer: Self-pay | Admitting: Internal Medicine

## 2020-05-03 DIAGNOSIS — I69351 Hemiplegia and hemiparesis following cerebral infarction affecting right dominant side: Secondary | ICD-10-CM | POA: Diagnosis not present

## 2020-05-03 DIAGNOSIS — I4819 Other persistent atrial fibrillation: Secondary | ICD-10-CM | POA: Diagnosis not present

## 2020-05-03 DIAGNOSIS — H353212 Exudative age-related macular degeneration, right eye, with inactive choroidal neovascularization: Secondary | ICD-10-CM | POA: Diagnosis not present

## 2020-05-03 DIAGNOSIS — Z95810 Presence of automatic (implantable) cardiac defibrillator: Secondary | ICD-10-CM | POA: Diagnosis not present

## 2020-05-03 DIAGNOSIS — M19042 Primary osteoarthritis, left hand: Secondary | ICD-10-CM | POA: Diagnosis not present

## 2020-05-03 DIAGNOSIS — H353221 Exudative age-related macular degeneration, left eye, with active choroidal neovascularization: Secondary | ICD-10-CM | POA: Diagnosis not present

## 2020-05-03 DIAGNOSIS — Z7902 Long term (current) use of antithrombotics/antiplatelets: Secondary | ICD-10-CM | POA: Diagnosis not present

## 2020-05-03 DIAGNOSIS — H548 Legal blindness, as defined in USA: Secondary | ICD-10-CM | POA: Diagnosis not present

## 2020-05-03 DIAGNOSIS — I5022 Chronic systolic (congestive) heart failure: Secondary | ICD-10-CM | POA: Diagnosis not present

## 2020-05-03 DIAGNOSIS — Z951 Presence of aortocoronary bypass graft: Secondary | ICD-10-CM | POA: Diagnosis not present

## 2020-05-03 DIAGNOSIS — I25119 Atherosclerotic heart disease of native coronary artery with unspecified angina pectoris: Secondary | ICD-10-CM | POA: Diagnosis not present

## 2020-05-03 DIAGNOSIS — I255 Ischemic cardiomyopathy: Secondary | ICD-10-CM | POA: Diagnosis not present

## 2020-05-03 DIAGNOSIS — Z7901 Long term (current) use of anticoagulants: Secondary | ICD-10-CM | POA: Diagnosis not present

## 2020-05-03 DIAGNOSIS — Z85038 Personal history of other malignant neoplasm of large intestine: Secondary | ICD-10-CM | POA: Diagnosis not present

## 2020-05-03 DIAGNOSIS — K219 Gastro-esophageal reflux disease without esophagitis: Secondary | ICD-10-CM | POA: Diagnosis not present

## 2020-05-03 DIAGNOSIS — I110 Hypertensive heart disease with heart failure: Secondary | ICD-10-CM | POA: Diagnosis not present

## 2020-05-03 DIAGNOSIS — M19041 Primary osteoarthritis, right hand: Secondary | ICD-10-CM | POA: Diagnosis not present

## 2020-05-03 DIAGNOSIS — E785 Hyperlipidemia, unspecified: Secondary | ICD-10-CM | POA: Diagnosis not present

## 2020-05-03 DIAGNOSIS — Z9181 History of falling: Secondary | ICD-10-CM | POA: Diagnosis not present

## 2020-05-03 DIAGNOSIS — I252 Old myocardial infarction: Secondary | ICD-10-CM | POA: Diagnosis not present

## 2020-05-03 DIAGNOSIS — D649 Anemia, unspecified: Secondary | ICD-10-CM | POA: Diagnosis not present

## 2020-05-03 DIAGNOSIS — M170 Bilateral primary osteoarthritis of knee: Secondary | ICD-10-CM | POA: Diagnosis not present

## 2020-05-03 DIAGNOSIS — H9190 Unspecified hearing loss, unspecified ear: Secondary | ICD-10-CM | POA: Diagnosis not present

## 2020-05-03 NOTE — Telephone Encounter (Signed)
Patient called and said that last night he fell. He said that he has a slightly bruised side. An appointment was offered but the patient said he just wanted to let Dr. Sharlet Salina know what had happened. He can be reached at [phone].

## 2020-05-03 NOTE — Telephone Encounter (Signed)
Noted  

## 2020-05-03 NOTE — Telephone Encounter (Signed)
See below

## 2020-05-06 ENCOUNTER — Ambulatory Visit (INDEPENDENT_AMBULATORY_CARE_PROVIDER_SITE_OTHER): Payer: Medicare Other

## 2020-05-06 ENCOUNTER — Telehealth: Payer: Self-pay | Admitting: Internal Medicine

## 2020-05-06 DIAGNOSIS — Z95810 Presence of automatic (implantable) cardiac defibrillator: Secondary | ICD-10-CM | POA: Diagnosis not present

## 2020-05-06 DIAGNOSIS — I5022 Chronic systolic (congestive) heart failure: Secondary | ICD-10-CM

## 2020-05-06 NOTE — Telephone Encounter (Signed)
Mark Clayton w/ [facility] is requesting verbals for PT for 1w1,2w4,1w4. Please advise.

## 2020-05-06 NOTE — Progress Notes (Signed)
Remote ICD transmission.   

## 2020-05-06 NOTE — Progress Notes (Addendum)
EPIC Encounter for ICM Monitoring  Patient Name: Mark Clayton is a 80 y.o. male Date: 05/06/2020 Primary Care Physican: [name], MD Cardiologist:Skains Electrophysiologist: Caryl Comes 2/7/2022Weight: 127lbs  Time in AF:24.0 hr/day (99.9%)(taking Xarelto)   Spoke with patient and denies any fluid symptoms.  He has been better at limiting salt intake but did eat ham biscuit this week.  Encouraged to continue to follow low salt diet.  OptivolThoracic impedancesuggesting possible fluid accumulation starting 04/13/2020.  Prescribed:  Furosemide 40 mg 1 tablet every other day.  Potassium 10 mEq take 1 tablet daily  Labs: 10/04/2019 Creatinine 0.86, BUN 8, Potassium 4.9, Sodium 137  07/27/2019 Creatinine1.17, BUN9, Potassium4.9, [id number] 07/03/2019 Creatinine1.14, BUN8, Potassium4.0, Sodium131, GFR>60  A complete set of results can be found in Results Review.  Recommendations: Recommendation to take 1 extra Furosemide tablet this week and resend report on 05/10/2020.   Follow-up plan: ICM clinic phone appointment on 05/10/2020 to recheck fluid levels.91 day device clinic remote transmission4/27/2022.  EP/Cardiology Office Visits:Recall for 08/31/2020 with Dr Marlou Porch.  Recall for 7/16/2022with Dr.Klein.   Copy of ICM check sent to Dr.Klein and Dr Marlou Porch for [facility].  3 month ICM trend: 05/06/2020.    1 Year ICM trend:       Rosalene Billings, RN 05/06/2020 2:50 PM

## 2020-05-06 NOTE — Telephone Encounter (Signed)
Fine

## 2020-05-06 NOTE — Telephone Encounter (Signed)
Spoke with Verdis Frederickson from [facility] to give verbal orders for PT. No other questions or concerns at this time.

## 2020-05-06 NOTE — Telephone Encounter (Signed)
See below

## 2020-05-08 ENCOUNTER — Other Ambulatory Visit: Payer: Self-pay | Admitting: Internal Medicine

## 2020-05-08 ENCOUNTER — Other Ambulatory Visit: Payer: Medicare Other

## 2020-05-09 NOTE — Telephone Encounter (Signed)
Ok to refill? Please advise.  

## 2020-05-10 ENCOUNTER — Other Ambulatory Visit: Payer: Medicare Other | Admitting: *Deleted

## 2020-05-10 ENCOUNTER — Other Ambulatory Visit: Payer: Self-pay

## 2020-05-10 ENCOUNTER — Ambulatory Visit (INDEPENDENT_AMBULATORY_CARE_PROVIDER_SITE_OTHER): Payer: Medicare Other

## 2020-05-10 DIAGNOSIS — I25119 Atherosclerotic heart disease of native coronary artery with unspecified angina pectoris: Secondary | ICD-10-CM | POA: Diagnosis not present

## 2020-05-10 DIAGNOSIS — Z95810 Presence of automatic (implantable) cardiac defibrillator: Secondary | ICD-10-CM

## 2020-05-10 DIAGNOSIS — I5022 Chronic systolic (congestive) heart failure: Secondary | ICD-10-CM

## 2020-05-10 DIAGNOSIS — I4819 Other persistent atrial fibrillation: Secondary | ICD-10-CM | POA: Diagnosis not present

## 2020-05-10 DIAGNOSIS — I255 Ischemic cardiomyopathy: Secondary | ICD-10-CM

## 2020-05-10 LAB — BASIC METABOLIC PANEL
BUN/Creatinine Ratio: 12 (ref 10–24)
BUN: 13 mg/dL (ref 8–27)
CO2: 22 mmol/L (ref 20–29)
Calcium: 9.4 mg/dL (ref 8.6–10.2)
Chloride: 98 mmol/L (ref 96–106)
Creatinine, Ser: 1.06 mg/dL (ref 0.76–1.27)
GFR calc Af Amer: 77 mL/min/{1.73_m2} (ref >59)
GFR calc non Af Amer: 66 mL/min/{1.73_m2} (ref >59)
Glucose: 155 mg/dL — ABNORMAL HIGH (ref 65–99)
Potassium: 4 mmol/L (ref 3.5–5.2)
Sodium: 134 mmol/L (ref 134–144)

## 2020-05-10 NOTE — Progress Notes (Signed)
EPIC Encounter for ICM Monitoring  Patient Name: Mark Clayton is a 80 y.o. male Date: 05/10/2020 Primary Care Physican: [name], MD Cardiologist:Skains Electrophysiologist: Caryl Comes 2/7/2022Weight: 127lbs  Time in AF:24.0 hr/day (100%)(taking Xarelto)   Spoke with patient on 05/10/2020 and reports feeling well at this time.  Denies fluid symptoms.    OptivolThoracic impedancesuggesting fluid levels returned to normal after taking extra Furosemide.  Prescribed:  Furosemide 40 mg 1 tablet every other day.  Potassium 10 mEq take 1 tablet daily  Labs: 10/04/2019 Creatinine 0.86, BUN 8, Potassium 4.9, Sodium 137  07/27/2019 Creatinine1.17, BUN9, Potassium4.9, [id number] 07/03/2019 Creatinine1.14, BUN8, Potassium4.0, Sodium131, GFR>60  A complete set of results can be found in Results Review.  Recommendations:No changes  Follow-up plan: ICM clinic phone appointment on 06/10/2020 to recheck fluid levels.91 day device clinic remote transmission4/27/2022.  EP/Cardiology Office Visits:Recall for 08/31/2020 with Dr Marlou Porch.  Recall for 7/16/2022with Dr.Klein.   Copy of ICM check sent to [place]  3 month ICM trend: 05/06/2020.    1 Year ICM trend:       Rosalene Billings, RN 05/10/2020 10:52 AM

## 2020-05-11 DIAGNOSIS — Z951 Presence of aortocoronary bypass graft: Secondary | ICD-10-CM | POA: Diagnosis not present

## 2020-05-11 DIAGNOSIS — I5022 Chronic systolic (congestive) heart failure: Secondary | ICD-10-CM | POA: Diagnosis not present

## 2020-05-11 DIAGNOSIS — H548 Legal blindness, as defined in USA: Secondary | ICD-10-CM | POA: Diagnosis not present

## 2020-05-11 DIAGNOSIS — Z7902 Long term (current) use of antithrombotics/antiplatelets: Secondary | ICD-10-CM | POA: Diagnosis not present

## 2020-05-11 DIAGNOSIS — I110 Hypertensive heart disease with heart failure: Secondary | ICD-10-CM | POA: Diagnosis not present

## 2020-05-11 DIAGNOSIS — M170 Bilateral primary osteoarthritis of knee: Secondary | ICD-10-CM | POA: Diagnosis not present

## 2020-05-11 DIAGNOSIS — I252 Old myocardial infarction: Secondary | ICD-10-CM | POA: Diagnosis not present

## 2020-05-11 DIAGNOSIS — E785 Hyperlipidemia, unspecified: Secondary | ICD-10-CM | POA: Diagnosis not present

## 2020-05-11 DIAGNOSIS — M19041 Primary osteoarthritis, right hand: Secondary | ICD-10-CM | POA: Diagnosis not present

## 2020-05-11 DIAGNOSIS — D649 Anemia, unspecified: Secondary | ICD-10-CM | POA: Diagnosis not present

## 2020-05-11 DIAGNOSIS — I4819 Other persistent atrial fibrillation: Secondary | ICD-10-CM | POA: Diagnosis not present

## 2020-05-11 DIAGNOSIS — H9190 Unspecified hearing loss, unspecified ear: Secondary | ICD-10-CM | POA: Diagnosis not present

## 2020-05-11 DIAGNOSIS — Z95810 Presence of automatic (implantable) cardiac defibrillator: Secondary | ICD-10-CM | POA: Diagnosis not present

## 2020-05-11 DIAGNOSIS — H353212 Exudative age-related macular degeneration, right eye, with inactive choroidal neovascularization: Secondary | ICD-10-CM | POA: Diagnosis not present

## 2020-05-11 DIAGNOSIS — M19042 Primary osteoarthritis, left hand: Secondary | ICD-10-CM | POA: Diagnosis not present

## 2020-05-11 DIAGNOSIS — I69351 Hemiplegia and hemiparesis following cerebral infarction affecting right dominant side: Secondary | ICD-10-CM | POA: Diagnosis not present

## 2020-05-11 DIAGNOSIS — K219 Gastro-esophageal reflux disease without esophagitis: Secondary | ICD-10-CM | POA: Diagnosis not present

## 2020-05-11 DIAGNOSIS — Z9181 History of falling: Secondary | ICD-10-CM | POA: Diagnosis not present

## 2020-05-11 DIAGNOSIS — Z85038 Personal history of other malignant neoplasm of large intestine: Secondary | ICD-10-CM | POA: Diagnosis not present

## 2020-05-11 DIAGNOSIS — H353221 Exudative age-related macular degeneration, left eye, with active choroidal neovascularization: Secondary | ICD-10-CM | POA: Diagnosis not present

## 2020-05-11 DIAGNOSIS — Z7901 Long term (current) use of anticoagulants: Secondary | ICD-10-CM | POA: Diagnosis not present

## 2020-05-11 DIAGNOSIS — I255 Ischemic cardiomyopathy: Secondary | ICD-10-CM | POA: Diagnosis not present

## 2020-05-11 DIAGNOSIS — I25119 Atherosclerotic heart disease of native coronary artery with unspecified angina pectoris: Secondary | ICD-10-CM | POA: Diagnosis not present

## 2020-05-16 DIAGNOSIS — M19041 Primary osteoarthritis, right hand: Secondary | ICD-10-CM | POA: Diagnosis not present

## 2020-05-16 DIAGNOSIS — I252 Old myocardial infarction: Secondary | ICD-10-CM | POA: Diagnosis not present

## 2020-05-16 DIAGNOSIS — H353221 Exudative age-related macular degeneration, left eye, with active choroidal neovascularization: Secondary | ICD-10-CM | POA: Diagnosis not present

## 2020-05-16 DIAGNOSIS — Z7902 Long term (current) use of antithrombotics/antiplatelets: Secondary | ICD-10-CM | POA: Diagnosis not present

## 2020-05-16 DIAGNOSIS — I25119 Atherosclerotic heart disease of native coronary artery with unspecified angina pectoris: Secondary | ICD-10-CM | POA: Diagnosis not present

## 2020-05-16 DIAGNOSIS — Z7901 Long term (current) use of anticoagulants: Secondary | ICD-10-CM | POA: Diagnosis not present

## 2020-05-16 DIAGNOSIS — D649 Anemia, unspecified: Secondary | ICD-10-CM | POA: Diagnosis not present

## 2020-05-16 DIAGNOSIS — I255 Ischemic cardiomyopathy: Secondary | ICD-10-CM | POA: Diagnosis not present

## 2020-05-16 DIAGNOSIS — Z85038 Personal history of other malignant neoplasm of large intestine: Secondary | ICD-10-CM | POA: Diagnosis not present

## 2020-05-16 DIAGNOSIS — Z9181 History of falling: Secondary | ICD-10-CM | POA: Diagnosis not present

## 2020-05-16 DIAGNOSIS — I69351 Hemiplegia and hemiparesis following cerebral infarction affecting right dominant side: Secondary | ICD-10-CM | POA: Diagnosis not present

## 2020-05-16 DIAGNOSIS — M170 Bilateral primary osteoarthritis of knee: Secondary | ICD-10-CM | POA: Diagnosis not present

## 2020-05-16 DIAGNOSIS — I110 Hypertensive heart disease with heart failure: Secondary | ICD-10-CM | POA: Diagnosis not present

## 2020-05-16 DIAGNOSIS — H9190 Unspecified hearing loss, unspecified ear: Secondary | ICD-10-CM | POA: Diagnosis not present

## 2020-05-16 DIAGNOSIS — H353212 Exudative age-related macular degeneration, right eye, with inactive choroidal neovascularization: Secondary | ICD-10-CM | POA: Diagnosis not present

## 2020-05-16 DIAGNOSIS — I5022 Chronic systolic (congestive) heart failure: Secondary | ICD-10-CM | POA: Diagnosis not present

## 2020-05-16 DIAGNOSIS — Z95810 Presence of automatic (implantable) cardiac defibrillator: Secondary | ICD-10-CM | POA: Diagnosis not present

## 2020-05-16 DIAGNOSIS — E785 Hyperlipidemia, unspecified: Secondary | ICD-10-CM | POA: Diagnosis not present

## 2020-05-16 DIAGNOSIS — H548 Legal blindness, as defined in USA: Secondary | ICD-10-CM | POA: Diagnosis not present

## 2020-05-16 DIAGNOSIS — M19042 Primary osteoarthritis, left hand: Secondary | ICD-10-CM | POA: Diagnosis not present

## 2020-05-16 DIAGNOSIS — Z951 Presence of aortocoronary bypass graft: Secondary | ICD-10-CM | POA: Diagnosis not present

## 2020-05-16 DIAGNOSIS — I4819 Other persistent atrial fibrillation: Secondary | ICD-10-CM | POA: Diagnosis not present

## 2020-05-16 DIAGNOSIS — K219 Gastro-esophageal reflux disease without esophagitis: Secondary | ICD-10-CM | POA: Diagnosis not present

## 2020-05-17 DIAGNOSIS — I252 Old myocardial infarction: Secondary | ICD-10-CM | POA: Diagnosis not present

## 2020-05-17 DIAGNOSIS — I5022 Chronic systolic (congestive) heart failure: Secondary | ICD-10-CM | POA: Diagnosis not present

## 2020-05-17 DIAGNOSIS — Z7902 Long term (current) use of antithrombotics/antiplatelets: Secondary | ICD-10-CM | POA: Diagnosis not present

## 2020-05-17 DIAGNOSIS — K219 Gastro-esophageal reflux disease without esophagitis: Secondary | ICD-10-CM | POA: Diagnosis not present

## 2020-05-17 DIAGNOSIS — M19041 Primary osteoarthritis, right hand: Secondary | ICD-10-CM | POA: Diagnosis not present

## 2020-05-17 DIAGNOSIS — D649 Anemia, unspecified: Secondary | ICD-10-CM | POA: Diagnosis not present

## 2020-05-17 DIAGNOSIS — H353221 Exudative age-related macular degeneration, left eye, with active choroidal neovascularization: Secondary | ICD-10-CM | POA: Diagnosis not present

## 2020-05-17 DIAGNOSIS — I110 Hypertensive heart disease with heart failure: Secondary | ICD-10-CM | POA: Diagnosis not present

## 2020-05-17 DIAGNOSIS — Z951 Presence of aortocoronary bypass graft: Secondary | ICD-10-CM | POA: Diagnosis not present

## 2020-05-17 DIAGNOSIS — M19042 Primary osteoarthritis, left hand: Secondary | ICD-10-CM | POA: Diagnosis not present

## 2020-05-17 DIAGNOSIS — H548 Legal blindness, as defined in USA: Secondary | ICD-10-CM | POA: Diagnosis not present

## 2020-05-17 DIAGNOSIS — I4819 Other persistent atrial fibrillation: Secondary | ICD-10-CM | POA: Diagnosis not present

## 2020-05-17 DIAGNOSIS — E785 Hyperlipidemia, unspecified: Secondary | ICD-10-CM | POA: Diagnosis not present

## 2020-05-17 DIAGNOSIS — Z85038 Personal history of other malignant neoplasm of large intestine: Secondary | ICD-10-CM | POA: Diagnosis not present

## 2020-05-17 DIAGNOSIS — I255 Ischemic cardiomyopathy: Secondary | ICD-10-CM | POA: Diagnosis not present

## 2020-05-17 DIAGNOSIS — Z95810 Presence of automatic (implantable) cardiac defibrillator: Secondary | ICD-10-CM | POA: Diagnosis not present

## 2020-05-17 DIAGNOSIS — M170 Bilateral primary osteoarthritis of knee: Secondary | ICD-10-CM | POA: Diagnosis not present

## 2020-05-17 DIAGNOSIS — Z7901 Long term (current) use of anticoagulants: Secondary | ICD-10-CM | POA: Diagnosis not present

## 2020-05-17 DIAGNOSIS — I25119 Atherosclerotic heart disease of native coronary artery with unspecified angina pectoris: Secondary | ICD-10-CM | POA: Diagnosis not present

## 2020-05-17 DIAGNOSIS — Z9181 History of falling: Secondary | ICD-10-CM | POA: Diagnosis not present

## 2020-05-17 DIAGNOSIS — I69351 Hemiplegia and hemiparesis following cerebral infarction affecting right dominant side: Secondary | ICD-10-CM | POA: Diagnosis not present

## 2020-05-17 DIAGNOSIS — H9190 Unspecified hearing loss, unspecified ear: Secondary | ICD-10-CM | POA: Diagnosis not present

## 2020-05-17 DIAGNOSIS — H353212 Exudative age-related macular degeneration, right eye, with inactive choroidal neovascularization: Secondary | ICD-10-CM | POA: Diagnosis not present

## 2020-05-21 DIAGNOSIS — Z9181 History of falling: Secondary | ICD-10-CM | POA: Diagnosis not present

## 2020-05-21 DIAGNOSIS — I5022 Chronic systolic (congestive) heart failure: Secondary | ICD-10-CM | POA: Diagnosis not present

## 2020-05-21 DIAGNOSIS — I252 Old myocardial infarction: Secondary | ICD-10-CM | POA: Diagnosis not present

## 2020-05-21 DIAGNOSIS — D649 Anemia, unspecified: Secondary | ICD-10-CM | POA: Diagnosis not present

## 2020-05-21 DIAGNOSIS — I69351 Hemiplegia and hemiparesis following cerebral infarction affecting right dominant side: Secondary | ICD-10-CM | POA: Diagnosis not present

## 2020-05-21 DIAGNOSIS — I4819 Other persistent atrial fibrillation: Secondary | ICD-10-CM | POA: Diagnosis not present

## 2020-05-21 DIAGNOSIS — Z7901 Long term (current) use of anticoagulants: Secondary | ICD-10-CM | POA: Diagnosis not present

## 2020-05-21 DIAGNOSIS — Z951 Presence of aortocoronary bypass graft: Secondary | ICD-10-CM | POA: Diagnosis not present

## 2020-05-21 DIAGNOSIS — H9190 Unspecified hearing loss, unspecified ear: Secondary | ICD-10-CM | POA: Diagnosis not present

## 2020-05-21 DIAGNOSIS — M170 Bilateral primary osteoarthritis of knee: Secondary | ICD-10-CM | POA: Diagnosis not present

## 2020-05-21 DIAGNOSIS — I110 Hypertensive heart disease with heart failure: Secondary | ICD-10-CM | POA: Diagnosis not present

## 2020-05-21 DIAGNOSIS — H353212 Exudative age-related macular degeneration, right eye, with inactive choroidal neovascularization: Secondary | ICD-10-CM | POA: Diagnosis not present

## 2020-05-21 DIAGNOSIS — K219 Gastro-esophageal reflux disease without esophagitis: Secondary | ICD-10-CM | POA: Diagnosis not present

## 2020-05-21 DIAGNOSIS — Z85038 Personal history of other malignant neoplasm of large intestine: Secondary | ICD-10-CM | POA: Diagnosis not present

## 2020-05-21 DIAGNOSIS — E785 Hyperlipidemia, unspecified: Secondary | ICD-10-CM | POA: Diagnosis not present

## 2020-05-21 DIAGNOSIS — H353221 Exudative age-related macular degeneration, left eye, with active choroidal neovascularization: Secondary | ICD-10-CM | POA: Diagnosis not present

## 2020-05-21 DIAGNOSIS — Z95810 Presence of automatic (implantable) cardiac defibrillator: Secondary | ICD-10-CM | POA: Diagnosis not present

## 2020-05-21 DIAGNOSIS — Z7902 Long term (current) use of antithrombotics/antiplatelets: Secondary | ICD-10-CM | POA: Diagnosis not present

## 2020-05-21 DIAGNOSIS — M19041 Primary osteoarthritis, right hand: Secondary | ICD-10-CM | POA: Diagnosis not present

## 2020-05-21 DIAGNOSIS — I255 Ischemic cardiomyopathy: Secondary | ICD-10-CM | POA: Diagnosis not present

## 2020-05-21 DIAGNOSIS — M19042 Primary osteoarthritis, left hand: Secondary | ICD-10-CM | POA: Diagnosis not present

## 2020-05-21 DIAGNOSIS — H548 Legal blindness, as defined in USA: Secondary | ICD-10-CM | POA: Diagnosis not present

## 2020-05-21 DIAGNOSIS — I25119 Atherosclerotic heart disease of native coronary artery with unspecified angina pectoris: Secondary | ICD-10-CM | POA: Diagnosis not present

## 2020-05-23 ENCOUNTER — Ambulatory Visit (INDEPENDENT_AMBULATORY_CARE_PROVIDER_SITE_OTHER): Payer: Medicare Other | Admitting: Internal Medicine

## 2020-05-23 ENCOUNTER — Other Ambulatory Visit: Payer: Self-pay

## 2020-05-23 ENCOUNTER — Encounter: Payer: Self-pay | Admitting: Internal Medicine

## 2020-05-23 VITALS — BP 118/60 | HR 74 | Temp 97.4°F | Resp 18 | Ht 72.0 in | Wt 126.2 lb

## 2020-05-23 DIAGNOSIS — Z8673 Personal history of transient ischemic attack (TIA), and cerebral infarction without residual deficits: Secondary | ICD-10-CM | POA: Diagnosis not present

## 2020-05-23 DIAGNOSIS — I5022 Chronic systolic (congestive) heart failure: Secondary | ICD-10-CM

## 2020-05-23 LAB — COMPREHENSIVE METABOLIC PANEL
ALT: 13 U/L (ref 0–53)
AST: 18 U/L (ref 0–37)
Albumin: 4 g/dL (ref 3.5–5.2)
Alkaline Phosphatase: 81 U/L (ref 39–117)
BUN: 13 mg/dL (ref 6–23)
CO2: 28 mEq/L (ref 19–32)
Calcium: 9.6 mg/dL (ref 8.4–10.5)
Chloride: 100 mEq/L (ref 96–112)
Creatinine, Ser: 0.96 mg/dL (ref 0.40–1.50)
GFR: 75.21 mL/min (ref >60.00)
Glucose, Bld: 106 mg/dL — ABNORMAL HIGH (ref 70–99)
Potassium: 4.4 mEq/L (ref 3.5–5.1)
Sodium: 134 mEq/L — ABNORMAL LOW (ref 135–145)
Total Bilirubin: 1.5 mg/dL — ABNORMAL HIGH (ref 0.2–1.2)
Total Protein: 6.6 g/dL (ref 6.0–8.3)

## 2020-05-23 LAB — CBC
HCT: 35.3 % — ABNORMAL LOW (ref 39.0–52.0)
Hemoglobin: 11.6 g/dL — ABNORMAL LOW (ref 13.0–17.0)
MCHC: 32.9 g/dL (ref 30.0–36.0)
MCV: 91.6 fl (ref 78.0–100.0)
Platelets: 169 10*3/uL (ref 150.0–400.0)
RBC: 3.86 Mil/uL — ABNORMAL LOW (ref 4.22–5.81)
RDW: 17.4 % — ABNORMAL HIGH (ref 11.5–15.5)
WBC: 4 10*3/uL (ref 4.0–10.5)

## 2020-05-23 LAB — MAGNESIUM: Magnesium: 2 mg/dL (ref 1.5–2.5)

## 2020-05-23 NOTE — Patient Instructions (Signed)
We will check the blood work today. ° ° °

## 2020-05-23 NOTE — Progress Notes (Signed)
° °  Subjective:   Patient ID: Mark Clayton, male    DOB: October 29, 1940, 80 y.o.   MRN: [id number]  HPI The patient is a 80 YO man coming in for anxiety. Had tried taking buspar for anxiety and this did give him SOB and felt like his throat was closing. He did stop taking this and those symptoms resolved. Did not seek care at the time. Has had change to his BP medication with starting spironolactone and his granddaughter noticed the packaging which stated you should not take potassium on this. They did stop his oral potassium pills and would like levels rechecked. He is currently using deep breathing and meditation for anxiety and has been free of anxiety for 2-3 weeks at this time. Given all his other medications does not want to start something for this now. Denies SI/HI.   Review of Systems  Constitutional: Negative.   HENT: Negative.   Eyes: Negative.   Respiratory: Negative for cough, chest tightness and shortness of breath.   Cardiovascular: Negative for chest pain, palpitations and leg swelling.  Gastrointestinal: Negative for abdominal distention, abdominal pain, constipation, diarrhea, nausea and vomiting.  Musculoskeletal: Positive for gait problem.  Skin: Negative.   Neurological: Positive for weakness.  Psychiatric/Behavioral: Negative.     Objective:  Physical Exam Constitutional:      Appearance: He is well-developed and well-nourished.  HENT:     Head: Normocephalic and atraumatic.  Eyes:     Extraocular Movements: EOM normal.  Cardiovascular:     Rate and Rhythm: Normal rate and regular rhythm.  Pulmonary:     Effort: Pulmonary effort is normal. No respiratory distress.     Breath sounds: Normal breath sounds. No wheezing or rales.  Abdominal:     General: Bowel sounds are normal. There is no distension.     Palpations: Abdomen is soft.     Tenderness: There is no abdominal tenderness. There is no rebound.  Musculoskeletal:        General: No edema.     Cervical  back: Normal range of motion.  Skin:    General: Skin is warm and dry.  Neurological:     Mental Status: He is alert and oriented to person, place, and time. Mental status is at baseline.     Cranial Nerves: Cranial nerve deficit present.     Motor: Weakness present.     Coordination: Coordination abnormal.  Psychiatric:        Mood and Affect: Mood and affect normal.     Vitals:   05/23/20 1055  BP: 118/60  Pulse: 74  Resp: 18  Temp: (!) 97.4 F (36.3 C)  TempSrc: Oral  SpO2: 100%  Weight: 126 lb 3.2 oz (57.2 kg)  Height: 6' (1.829 m)    This visit occurred during the SARS-CoV-2 public health emergency.  Safety protocols were in place, including screening questions prior to the visit, additional usage of staff PPE, and extensive cleaning of exam room while observing appropriate contact time as indicated for disinfecting solutions.   Assessment & Plan:

## 2020-05-24 DIAGNOSIS — Z7901 Long term (current) use of anticoagulants: Secondary | ICD-10-CM | POA: Diagnosis not present

## 2020-05-24 DIAGNOSIS — Z951 Presence of aortocoronary bypass graft: Secondary | ICD-10-CM | POA: Diagnosis not present

## 2020-05-24 DIAGNOSIS — H353212 Exudative age-related macular degeneration, right eye, with inactive choroidal neovascularization: Secondary | ICD-10-CM | POA: Diagnosis not present

## 2020-05-24 DIAGNOSIS — D649 Anemia, unspecified: Secondary | ICD-10-CM | POA: Diagnosis not present

## 2020-05-24 DIAGNOSIS — I5022 Chronic systolic (congestive) heart failure: Secondary | ICD-10-CM | POA: Diagnosis not present

## 2020-05-24 DIAGNOSIS — M19042 Primary osteoarthritis, left hand: Secondary | ICD-10-CM | POA: Diagnosis not present

## 2020-05-24 DIAGNOSIS — I69351 Hemiplegia and hemiparesis following cerebral infarction affecting right dominant side: Secondary | ICD-10-CM | POA: Diagnosis not present

## 2020-05-24 DIAGNOSIS — Z7902 Long term (current) use of antithrombotics/antiplatelets: Secondary | ICD-10-CM | POA: Diagnosis not present

## 2020-05-24 DIAGNOSIS — I255 Ischemic cardiomyopathy: Secondary | ICD-10-CM | POA: Diagnosis not present

## 2020-05-24 DIAGNOSIS — E785 Hyperlipidemia, unspecified: Secondary | ICD-10-CM | POA: Diagnosis not present

## 2020-05-24 DIAGNOSIS — Z85038 Personal history of other malignant neoplasm of large intestine: Secondary | ICD-10-CM | POA: Diagnosis not present

## 2020-05-24 DIAGNOSIS — Z95810 Presence of automatic (implantable) cardiac defibrillator: Secondary | ICD-10-CM | POA: Diagnosis not present

## 2020-05-24 DIAGNOSIS — H9190 Unspecified hearing loss, unspecified ear: Secondary | ICD-10-CM | POA: Diagnosis not present

## 2020-05-24 DIAGNOSIS — I252 Old myocardial infarction: Secondary | ICD-10-CM | POA: Diagnosis not present

## 2020-05-24 DIAGNOSIS — I25119 Atherosclerotic heart disease of native coronary artery with unspecified angina pectoris: Secondary | ICD-10-CM | POA: Diagnosis not present

## 2020-05-24 DIAGNOSIS — H548 Legal blindness, as defined in USA: Secondary | ICD-10-CM | POA: Diagnosis not present

## 2020-05-24 DIAGNOSIS — H353221 Exudative age-related macular degeneration, left eye, with active choroidal neovascularization: Secondary | ICD-10-CM | POA: Diagnosis not present

## 2020-05-24 DIAGNOSIS — K219 Gastro-esophageal reflux disease without esophagitis: Secondary | ICD-10-CM | POA: Diagnosis not present

## 2020-05-24 DIAGNOSIS — Z9181 History of falling: Secondary | ICD-10-CM | POA: Diagnosis not present

## 2020-05-24 DIAGNOSIS — I110 Hypertensive heart disease with heart failure: Secondary | ICD-10-CM | POA: Diagnosis not present

## 2020-05-24 DIAGNOSIS — I4819 Other persistent atrial fibrillation: Secondary | ICD-10-CM | POA: Diagnosis not present

## 2020-05-24 DIAGNOSIS — M170 Bilateral primary osteoarthritis of knee: Secondary | ICD-10-CM | POA: Diagnosis not present

## 2020-05-24 DIAGNOSIS — M19041 Primary osteoarthritis, right hand: Secondary | ICD-10-CM | POA: Diagnosis not present

## 2020-05-24 NOTE — Assessment & Plan Note (Signed)
Doing better overall with anxiety with coping skills and breathing exercises.

## 2020-05-24 NOTE — Assessment & Plan Note (Signed)
Checking CBC, CMP, magnesium.

## 2020-05-27 DIAGNOSIS — H353212 Exudative age-related macular degeneration, right eye, with inactive choroidal neovascularization: Secondary | ICD-10-CM | POA: Diagnosis not present

## 2020-05-27 DIAGNOSIS — Z9181 History of falling: Secondary | ICD-10-CM | POA: Diagnosis not present

## 2020-05-27 DIAGNOSIS — Z951 Presence of aortocoronary bypass graft: Secondary | ICD-10-CM | POA: Diagnosis not present

## 2020-05-27 DIAGNOSIS — I69351 Hemiplegia and hemiparesis following cerebral infarction affecting right dominant side: Secondary | ICD-10-CM | POA: Diagnosis not present

## 2020-05-27 DIAGNOSIS — Z7902 Long term (current) use of antithrombotics/antiplatelets: Secondary | ICD-10-CM | POA: Diagnosis not present

## 2020-05-27 DIAGNOSIS — H9190 Unspecified hearing loss, unspecified ear: Secondary | ICD-10-CM | POA: Diagnosis not present

## 2020-05-27 DIAGNOSIS — D649 Anemia, unspecified: Secondary | ICD-10-CM | POA: Diagnosis not present

## 2020-05-27 DIAGNOSIS — M19041 Primary osteoarthritis, right hand: Secondary | ICD-10-CM | POA: Diagnosis not present

## 2020-05-27 DIAGNOSIS — Z7901 Long term (current) use of anticoagulants: Secondary | ICD-10-CM | POA: Diagnosis not present

## 2020-05-27 DIAGNOSIS — H353221 Exudative age-related macular degeneration, left eye, with active choroidal neovascularization: Secondary | ICD-10-CM | POA: Diagnosis not present

## 2020-05-27 DIAGNOSIS — Z85038 Personal history of other malignant neoplasm of large intestine: Secondary | ICD-10-CM | POA: Diagnosis not present

## 2020-05-27 DIAGNOSIS — E785 Hyperlipidemia, unspecified: Secondary | ICD-10-CM | POA: Diagnosis not present

## 2020-05-27 DIAGNOSIS — I110 Hypertensive heart disease with heart failure: Secondary | ICD-10-CM | POA: Diagnosis not present

## 2020-05-27 DIAGNOSIS — K219 Gastro-esophageal reflux disease without esophagitis: Secondary | ICD-10-CM | POA: Diagnosis not present

## 2020-05-27 DIAGNOSIS — M19042 Primary osteoarthritis, left hand: Secondary | ICD-10-CM | POA: Diagnosis not present

## 2020-05-27 DIAGNOSIS — H548 Legal blindness, as defined in USA: Secondary | ICD-10-CM | POA: Diagnosis not present

## 2020-05-27 DIAGNOSIS — Z95810 Presence of automatic (implantable) cardiac defibrillator: Secondary | ICD-10-CM | POA: Diagnosis not present

## 2020-05-27 DIAGNOSIS — I255 Ischemic cardiomyopathy: Secondary | ICD-10-CM | POA: Diagnosis not present

## 2020-05-27 DIAGNOSIS — M170 Bilateral primary osteoarthritis of knee: Secondary | ICD-10-CM | POA: Diagnosis not present

## 2020-05-27 DIAGNOSIS — I25119 Atherosclerotic heart disease of native coronary artery with unspecified angina pectoris: Secondary | ICD-10-CM | POA: Diagnosis not present

## 2020-05-27 DIAGNOSIS — I5022 Chronic systolic (congestive) heart failure: Secondary | ICD-10-CM | POA: Diagnosis not present

## 2020-05-27 DIAGNOSIS — I4819 Other persistent atrial fibrillation: Secondary | ICD-10-CM | POA: Diagnosis not present

## 2020-05-27 DIAGNOSIS — I252 Old myocardial infarction: Secondary | ICD-10-CM | POA: Diagnosis not present

## 2020-05-29 ENCOUNTER — Telehealth: Payer: Self-pay

## 2020-05-29 NOTE — Telephone Encounter (Signed)
Returned call to patient as requested by voice mail message.  He wanted to know how his fluid levels look today.  Explained unable to view live reports in [place] system and reports only viewable when it is scheduled automatically or unless he sends a manual transmission.  He is not having any current fluid symptoms and has no complaints.  Advised next ICM remote transmission is due on 06/10/2020 and he said that will be fine.

## 2020-06-01 DIAGNOSIS — I255 Ischemic cardiomyopathy: Secondary | ICD-10-CM | POA: Diagnosis not present

## 2020-06-01 DIAGNOSIS — Z95810 Presence of automatic (implantable) cardiac defibrillator: Secondary | ICD-10-CM | POA: Diagnosis not present

## 2020-06-01 DIAGNOSIS — K219 Gastro-esophageal reflux disease without esophagitis: Secondary | ICD-10-CM | POA: Diagnosis not present

## 2020-06-01 DIAGNOSIS — I4819 Other persistent atrial fibrillation: Secondary | ICD-10-CM | POA: Diagnosis not present

## 2020-06-01 DIAGNOSIS — M19042 Primary osteoarthritis, left hand: Secondary | ICD-10-CM | POA: Diagnosis not present

## 2020-06-01 DIAGNOSIS — M170 Bilateral primary osteoarthritis of knee: Secondary | ICD-10-CM | POA: Diagnosis not present

## 2020-06-01 DIAGNOSIS — Z9181 History of falling: Secondary | ICD-10-CM | POA: Diagnosis not present

## 2020-06-01 DIAGNOSIS — H353212 Exudative age-related macular degeneration, right eye, with inactive choroidal neovascularization: Secondary | ICD-10-CM | POA: Diagnosis not present

## 2020-06-01 DIAGNOSIS — I69351 Hemiplegia and hemiparesis following cerebral infarction affecting right dominant side: Secondary | ICD-10-CM | POA: Diagnosis not present

## 2020-06-01 DIAGNOSIS — Z951 Presence of aortocoronary bypass graft: Secondary | ICD-10-CM | POA: Diagnosis not present

## 2020-06-01 DIAGNOSIS — Z85038 Personal history of other malignant neoplasm of large intestine: Secondary | ICD-10-CM | POA: Diagnosis not present

## 2020-06-01 DIAGNOSIS — I5022 Chronic systolic (congestive) heart failure: Secondary | ICD-10-CM | POA: Diagnosis not present

## 2020-06-01 DIAGNOSIS — Z7902 Long term (current) use of antithrombotics/antiplatelets: Secondary | ICD-10-CM | POA: Diagnosis not present

## 2020-06-01 DIAGNOSIS — H9190 Unspecified hearing loss, unspecified ear: Secondary | ICD-10-CM | POA: Diagnosis not present

## 2020-06-01 DIAGNOSIS — I110 Hypertensive heart disease with heart failure: Secondary | ICD-10-CM | POA: Diagnosis not present

## 2020-06-01 DIAGNOSIS — I25119 Atherosclerotic heart disease of native coronary artery with unspecified angina pectoris: Secondary | ICD-10-CM | POA: Diagnosis not present

## 2020-06-01 DIAGNOSIS — H353221 Exudative age-related macular degeneration, left eye, with active choroidal neovascularization: Secondary | ICD-10-CM | POA: Diagnosis not present

## 2020-06-01 DIAGNOSIS — E785 Hyperlipidemia, unspecified: Secondary | ICD-10-CM | POA: Diagnosis not present

## 2020-06-01 DIAGNOSIS — Z7901 Long term (current) use of anticoagulants: Secondary | ICD-10-CM | POA: Diagnosis not present

## 2020-06-01 DIAGNOSIS — H548 Legal blindness, as defined in USA: Secondary | ICD-10-CM | POA: Diagnosis not present

## 2020-06-01 DIAGNOSIS — M19041 Primary osteoarthritis, right hand: Secondary | ICD-10-CM | POA: Diagnosis not present

## 2020-06-01 DIAGNOSIS — I252 Old myocardial infarction: Secondary | ICD-10-CM | POA: Diagnosis not present

## 2020-06-01 DIAGNOSIS — D649 Anemia, unspecified: Secondary | ICD-10-CM | POA: Diagnosis not present

## 2020-06-07 DIAGNOSIS — Z7901 Long term (current) use of anticoagulants: Secondary | ICD-10-CM | POA: Diagnosis not present

## 2020-06-07 DIAGNOSIS — H353212 Exudative age-related macular degeneration, right eye, with inactive choroidal neovascularization: Secondary | ICD-10-CM | POA: Diagnosis not present

## 2020-06-07 DIAGNOSIS — H353221 Exudative age-related macular degeneration, left eye, with active choroidal neovascularization: Secondary | ICD-10-CM | POA: Diagnosis not present

## 2020-06-07 DIAGNOSIS — K219 Gastro-esophageal reflux disease without esophagitis: Secondary | ICD-10-CM | POA: Diagnosis not present

## 2020-06-07 DIAGNOSIS — I4819 Other persistent atrial fibrillation: Secondary | ICD-10-CM | POA: Diagnosis not present

## 2020-06-07 DIAGNOSIS — H9190 Unspecified hearing loss, unspecified ear: Secondary | ICD-10-CM | POA: Diagnosis not present

## 2020-06-07 DIAGNOSIS — M19041 Primary osteoarthritis, right hand: Secondary | ICD-10-CM | POA: Diagnosis not present

## 2020-06-07 DIAGNOSIS — I69351 Hemiplegia and hemiparesis following cerebral infarction affecting right dominant side: Secondary | ICD-10-CM | POA: Diagnosis not present

## 2020-06-07 DIAGNOSIS — H548 Legal blindness, as defined in USA: Secondary | ICD-10-CM | POA: Diagnosis not present

## 2020-06-07 DIAGNOSIS — M19042 Primary osteoarthritis, left hand: Secondary | ICD-10-CM | POA: Diagnosis not present

## 2020-06-07 DIAGNOSIS — I252 Old myocardial infarction: Secondary | ICD-10-CM | POA: Diagnosis not present

## 2020-06-07 DIAGNOSIS — Z9181 History of falling: Secondary | ICD-10-CM | POA: Diagnosis not present

## 2020-06-07 DIAGNOSIS — Z95810 Presence of automatic (implantable) cardiac defibrillator: Secondary | ICD-10-CM | POA: Diagnosis not present

## 2020-06-07 DIAGNOSIS — Z7902 Long term (current) use of antithrombotics/antiplatelets: Secondary | ICD-10-CM | POA: Diagnosis not present

## 2020-06-07 DIAGNOSIS — I25119 Atherosclerotic heart disease of native coronary artery with unspecified angina pectoris: Secondary | ICD-10-CM | POA: Diagnosis not present

## 2020-06-07 DIAGNOSIS — I255 Ischemic cardiomyopathy: Secondary | ICD-10-CM | POA: Diagnosis not present

## 2020-06-07 DIAGNOSIS — D649 Anemia, unspecified: Secondary | ICD-10-CM | POA: Diagnosis not present

## 2020-06-07 DIAGNOSIS — Z951 Presence of aortocoronary bypass graft: Secondary | ICD-10-CM | POA: Diagnosis not present

## 2020-06-07 DIAGNOSIS — I110 Hypertensive heart disease with heart failure: Secondary | ICD-10-CM | POA: Diagnosis not present

## 2020-06-07 DIAGNOSIS — Z85038 Personal history of other malignant neoplasm of large intestine: Secondary | ICD-10-CM | POA: Diagnosis not present

## 2020-06-07 DIAGNOSIS — I5022 Chronic systolic (congestive) heart failure: Secondary | ICD-10-CM | POA: Diagnosis not present

## 2020-06-07 DIAGNOSIS — M170 Bilateral primary osteoarthritis of knee: Secondary | ICD-10-CM | POA: Diagnosis not present

## 2020-06-07 DIAGNOSIS — E785 Hyperlipidemia, unspecified: Secondary | ICD-10-CM | POA: Diagnosis not present

## 2020-06-10 ENCOUNTER — Ambulatory Visit (INDEPENDENT_AMBULATORY_CARE_PROVIDER_SITE_OTHER): Payer: Medicare Other

## 2020-06-10 DIAGNOSIS — I5022 Chronic systolic (congestive) heart failure: Secondary | ICD-10-CM

## 2020-06-10 DIAGNOSIS — Z95810 Presence of automatic (implantable) cardiac defibrillator: Secondary | ICD-10-CM

## 2020-06-11 DIAGNOSIS — I252 Old myocardial infarction: Secondary | ICD-10-CM | POA: Diagnosis not present

## 2020-06-11 DIAGNOSIS — Z7902 Long term (current) use of antithrombotics/antiplatelets: Secondary | ICD-10-CM | POA: Diagnosis not present

## 2020-06-11 DIAGNOSIS — I5022 Chronic systolic (congestive) heart failure: Secondary | ICD-10-CM | POA: Diagnosis not present

## 2020-06-11 DIAGNOSIS — H353212 Exudative age-related macular degeneration, right eye, with inactive choroidal neovascularization: Secondary | ICD-10-CM | POA: Diagnosis not present

## 2020-06-11 DIAGNOSIS — Z9181 History of falling: Secondary | ICD-10-CM | POA: Diagnosis not present

## 2020-06-11 DIAGNOSIS — I255 Ischemic cardiomyopathy: Secondary | ICD-10-CM | POA: Diagnosis not present

## 2020-06-11 DIAGNOSIS — H548 Legal blindness, as defined in USA: Secondary | ICD-10-CM | POA: Diagnosis not present

## 2020-06-11 DIAGNOSIS — E785 Hyperlipidemia, unspecified: Secondary | ICD-10-CM | POA: Diagnosis not present

## 2020-06-11 DIAGNOSIS — Z85038 Personal history of other malignant neoplasm of large intestine: Secondary | ICD-10-CM | POA: Diagnosis not present

## 2020-06-11 DIAGNOSIS — D649 Anemia, unspecified: Secondary | ICD-10-CM | POA: Diagnosis not present

## 2020-06-11 DIAGNOSIS — I69351 Hemiplegia and hemiparesis following cerebral infarction affecting right dominant side: Secondary | ICD-10-CM | POA: Diagnosis not present

## 2020-06-11 DIAGNOSIS — Z95810 Presence of automatic (implantable) cardiac defibrillator: Secondary | ICD-10-CM | POA: Diagnosis not present

## 2020-06-11 DIAGNOSIS — I4819 Other persistent atrial fibrillation: Secondary | ICD-10-CM | POA: Diagnosis not present

## 2020-06-11 DIAGNOSIS — H9190 Unspecified hearing loss, unspecified ear: Secondary | ICD-10-CM | POA: Diagnosis not present

## 2020-06-11 DIAGNOSIS — M19042 Primary osteoarthritis, left hand: Secondary | ICD-10-CM | POA: Diagnosis not present

## 2020-06-11 DIAGNOSIS — Z951 Presence of aortocoronary bypass graft: Secondary | ICD-10-CM | POA: Diagnosis not present

## 2020-06-11 DIAGNOSIS — M170 Bilateral primary osteoarthritis of knee: Secondary | ICD-10-CM | POA: Diagnosis not present

## 2020-06-11 DIAGNOSIS — I25119 Atherosclerotic heart disease of native coronary artery with unspecified angina pectoris: Secondary | ICD-10-CM | POA: Diagnosis not present

## 2020-06-11 DIAGNOSIS — K219 Gastro-esophageal reflux disease without esophagitis: Secondary | ICD-10-CM | POA: Diagnosis not present

## 2020-06-11 DIAGNOSIS — M19041 Primary osteoarthritis, right hand: Secondary | ICD-10-CM | POA: Diagnosis not present

## 2020-06-11 DIAGNOSIS — H353221 Exudative age-related macular degeneration, left eye, with active choroidal neovascularization: Secondary | ICD-10-CM | POA: Diagnosis not present

## 2020-06-11 DIAGNOSIS — I110 Hypertensive heart disease with heart failure: Secondary | ICD-10-CM | POA: Diagnosis not present

## 2020-06-11 DIAGNOSIS — Z7901 Long term (current) use of anticoagulants: Secondary | ICD-10-CM | POA: Diagnosis not present

## 2020-06-11 NOTE — Progress Notes (Signed)
EPIC Encounter for ICM Monitoring  Patient Name: TYRELLE RACZKA is a 80 y.o. male Date: 06/11/2020 Primary Care Physican: [name], MD Cardiologist:[place] Electrophysiologist: Caryl Comes 3/15/2022Weight: 129lbs  Time in AF:23.9 hr/day (99.7%)(taking Xarelto)    Spoke with patient and reports feeling well at this time.  Denies fluid symptoms.    OptivolThoracic impedancesuggestingnormal fluid levels.  Prescribed:  Furosemide 40 mg 1 tablet every other day.  Potassium 10 mEq take 1 tablet daily  Labs: 10/04/2019 Creatinine 0.86, BUN 8, Potassium 4.9, Sodium 137  07/27/2019 Creatinine1.17, BUN9, Potassium4.9, [id number] 07/03/2019 Creatinine1.14, BUN8, Potassium4.0, Sodium131, GFR>60  A complete set of results can be found in Results Review.  Recommendations:No changes and encouraged to call if experiencing any fluid symptoms.  Follow-up plan: ICM clinic phone appointment on4/18/2022.91 day device clinic remote transmission4/27/2022.  EP/Cardiology Office Visits:07/30/2020 with Dr Marlou Porch. Recall for 7/16/2022with Dr.Klein.   Copy of ICM check sent to [place]  3 month ICM trend: 06/10/2020.    1 Year ICM trend:       Rosalene Billings, RN 06/11/2020 12:27 PM

## 2020-06-12 ENCOUNTER — Other Ambulatory Visit: Payer: Self-pay | Admitting: Internal Medicine

## 2020-06-13 ENCOUNTER — Ambulatory Visit: Payer: Medicare Other

## 2020-06-17 ENCOUNTER — Other Ambulatory Visit: Payer: Self-pay | Admitting: Physical Medicine and Rehabilitation

## 2020-06-18 DIAGNOSIS — Z85038 Personal history of other malignant neoplasm of large intestine: Secondary | ICD-10-CM | POA: Diagnosis not present

## 2020-06-18 DIAGNOSIS — I5022 Chronic systolic (congestive) heart failure: Secondary | ICD-10-CM | POA: Diagnosis not present

## 2020-06-18 DIAGNOSIS — H548 Legal blindness, as defined in USA: Secondary | ICD-10-CM | POA: Diagnosis not present

## 2020-06-18 DIAGNOSIS — Z9181 History of falling: Secondary | ICD-10-CM | POA: Diagnosis not present

## 2020-06-18 DIAGNOSIS — H9190 Unspecified hearing loss, unspecified ear: Secondary | ICD-10-CM | POA: Diagnosis not present

## 2020-06-18 DIAGNOSIS — H353221 Exudative age-related macular degeneration, left eye, with active choroidal neovascularization: Secondary | ICD-10-CM | POA: Diagnosis not present

## 2020-06-18 DIAGNOSIS — D649 Anemia, unspecified: Secondary | ICD-10-CM | POA: Diagnosis not present

## 2020-06-18 DIAGNOSIS — Z7901 Long term (current) use of anticoagulants: Secondary | ICD-10-CM | POA: Diagnosis not present

## 2020-06-18 DIAGNOSIS — I69351 Hemiplegia and hemiparesis following cerebral infarction affecting right dominant side: Secondary | ICD-10-CM | POA: Diagnosis not present

## 2020-06-18 DIAGNOSIS — I25119 Atherosclerotic heart disease of native coronary artery with unspecified angina pectoris: Secondary | ICD-10-CM | POA: Diagnosis not present

## 2020-06-18 DIAGNOSIS — I4819 Other persistent atrial fibrillation: Secondary | ICD-10-CM | POA: Diagnosis not present

## 2020-06-18 DIAGNOSIS — Z7902 Long term (current) use of antithrombotics/antiplatelets: Secondary | ICD-10-CM | POA: Diagnosis not present

## 2020-06-18 DIAGNOSIS — H353212 Exudative age-related macular degeneration, right eye, with inactive choroidal neovascularization: Secondary | ICD-10-CM | POA: Diagnosis not present

## 2020-06-18 DIAGNOSIS — M170 Bilateral primary osteoarthritis of knee: Secondary | ICD-10-CM | POA: Diagnosis not present

## 2020-06-18 DIAGNOSIS — I110 Hypertensive heart disease with heart failure: Secondary | ICD-10-CM | POA: Diagnosis not present

## 2020-06-18 DIAGNOSIS — K219 Gastro-esophageal reflux disease without esophagitis: Secondary | ICD-10-CM | POA: Diagnosis not present

## 2020-06-18 DIAGNOSIS — Z951 Presence of aortocoronary bypass graft: Secondary | ICD-10-CM | POA: Diagnosis not present

## 2020-06-18 DIAGNOSIS — M19041 Primary osteoarthritis, right hand: Secondary | ICD-10-CM | POA: Diagnosis not present

## 2020-06-18 DIAGNOSIS — E785 Hyperlipidemia, unspecified: Secondary | ICD-10-CM | POA: Diagnosis not present

## 2020-06-18 DIAGNOSIS — I255 Ischemic cardiomyopathy: Secondary | ICD-10-CM | POA: Diagnosis not present

## 2020-06-18 DIAGNOSIS — M19042 Primary osteoarthritis, left hand: Secondary | ICD-10-CM | POA: Diagnosis not present

## 2020-06-18 DIAGNOSIS — I252 Old myocardial infarction: Secondary | ICD-10-CM | POA: Diagnosis not present

## 2020-06-18 DIAGNOSIS — Z95810 Presence of automatic (implantable) cardiac defibrillator: Secondary | ICD-10-CM | POA: Diagnosis not present

## 2020-06-20 ENCOUNTER — Other Ambulatory Visit: Payer: Self-pay | Admitting: Physical Medicine and Rehabilitation

## 2020-06-20 ENCOUNTER — Ambulatory Visit (INDEPENDENT_AMBULATORY_CARE_PROVIDER_SITE_OTHER): Payer: Medicare Other

## 2020-06-20 DIAGNOSIS — Z0000 Encounter for general adult medical examination without abnormal findings: Secondary | ICD-10-CM

## 2020-06-20 NOTE — Progress Notes (Signed)
I connected with Gilmer Mor today by telephone and verified that I am speaking with the correct person using two identifiers. Location patient: home Location provider: work Persons participating in the virtual visit: Jontez Redfield and Lisette Abu, LPN.   I discussed the limitations, risks, security and privacy concerns of performing an evaluation and management service by telephone and the availability of in person appointments. I also discussed with the patient that there may be a patient responsible charge related to this service. The patient expressed understanding and verbally consented to this telephonic visit.    Interactive audio and video telecommunications were attempted between this provider and patient, however failed, due to patient having technical difficulties OR patient did not have access to video capability.  We continued and completed visit with audio only.  Some vital signs may be absent or patient reported.   Time Spent with patient on telephone encounter: 30 minutes  Subjective:   Mark Clayton is a 80 y.o. male who presents for Medicare Annual/Subsequent preventive examination.  Review of Systems    No ROS. Medicare Wellness Visit. Additional risk factors are reflected in social history. Cardiac Risk Factors include: advanced age (>79men, >29 women);dyslipidemia;family history of premature cardiovascular disease;hypertension;male gender     Objective:    There were no vitals filed for this visit. There is no height or weight on file to calculate BMI.  Advanced Directives 06/20/2020 06/14/2019 02/15/2019 12/07/2018 11/22/2018 09/27/2017 08/10/2017  Does Patient Have a Medical Advance Directive? Yes No Yes Yes Yes Yes Yes  Type of Advance Directive Living will;Healthcare Power of [place];Living will Healthcare Power of Attorney Living will Living will;Healthcare Power of [place]  Does patient want to  make changes to medical advance directive? No - Patient declined - - No - Patient declined No - Patient declined No - Patient declined -  Copy of [place] in Chart? No - copy requested - No - copy requested No - copy requested - No - copy requested No - copy requested  Would patient like information on creating a medical advance directive? - No - Patient declined - - - - -  Pre-existing out of facility DNR order (yellow form or pink MOST form) - - - - - - -    Current Medications (verified) Outpatient Encounter Medications as of 06/20/2020  Medication Sig  . acetaminophen (TYLENOL) 325 MG tablet Take 1-2 tablets (325-650 mg total) by mouth every 4 (four) hours as needed for mild pain.  Marland Kitchen atorvastatin (LIPITOR) 80 MG tablet TAKE 1 TABLET BY MOUTH  DAILY  . carvedilol (COREG) 6.25 MG tablet TAKE 1 TABLET BY MOUTH  TWICE DAILY WITH A MEAL  . clopidogrel (PLAVIX) 75 MG tablet Take 1 tablet (75 mg total) by mouth daily.  Marland Kitchen ezetimibe (ZETIA) 10 MG tablet TAKE 1 TABLET BY MOUTH  DAILY  . furosemide (LASIX) 40 MG tablet TAKE 1 TABLET BY MOUTH  DAILY  . isosorbide mononitrate (IMDUR) 30 MG 24 hr tablet Take 1 tablet (30 mg total) by mouth daily.  Marland Kitchen losartan (COZAAR) 50 MG tablet TAKE 1 TABLET BY MOUTH  DAILY  . nitroGLYCERIN (NITROSTAT) 0.4 MG SL tablet DISSOLVE 1 TABLET UNDER THE TONGUE EVERY 5 MINUTES AS  NEEDED FOR CHEST PAIN. MAX  OF 3 TABLETS IN 15 MINUTES. CALL 911 IF PAIN PERSISTS.  Marland Kitchen pantoprazole (PROTONIX) 40 MG tablet TAKE 1 TABLET BY MOUTH  DAILY  . polyethylene glycol (  MIRALAX / GLYCOLAX) 17 g packet Take 17 g by mouth as needed.  . potassium chloride (KLOR-CON) 10 MEQ tablet Take 1 tablet (10 mEq total) by mouth daily. (Patient not taking: Reported on 05/23/2020)  . rivaroxaban (XARELTO) 20 MG TABS tablet Take 1 tablet (20 mg total) by mouth daily with supper.  Marland Kitchen spironolactone (ALDACTONE) 25 MG tablet Take 1 tablet (25 mg total) by mouth daily.  . vitamin B-12  (CYANOCOBALAMIN) 100 MCG tablet Take 100 mcg by mouth daily.    No facility-administered encounter medications on file as of 06/20/2020.    Allergies (verified) Buspar [buspirone], Latex, and Tape   History: Past Medical History:  Diagnosis Date  . AICD (automatic cardioverter/defibrillator) present 01/17/2003   Medtronic Maximo 7232CX ICD, serial [id number]  . Anemia 02-06-11   takes oral iron  . Arthritis    hands, knees  . CAD (coronary artery disease) 2003   a. h/o MI and CABG in 2003. b. s/p DES to SVG-RPDA-RPLB in 08/2014.  Marland Kitchen Cancer of sigmoid colon ([place]) 2012   a. s/p colon surgery.  . Carotid bruit   . Chronic systolic CHF (congestive heart failure) (HCC)    a. EF 20% in 2014; b. 08/2017 Echo: EF 20-25%, diff HK, Gr3 DD. Triv AI. Mod MR. Sev dil LA. Mildly dil RV w/ mildly reduced RV fxn. Mildly dil RA. Mod TR. PASP 75mHg.  Marland Kitchen Cough   . Exudative age-related macular degeneration of left eye with active choroidal neovascularization ([place]) 07/12/2019  . Exudative age-related macular degeneration, right eye, with inactive choroidal neovascularization ([place]) 07/12/2019  . GERD (gastroesophageal reflux disease) 02-06-11  . HTN (hypertension)   . Hyperlipidemia   . Ischemic cardiomyopathy    a. EF 20% in 2014. (Master study EF >20%); b. 08/2017 Echo: EF 20-25%, diff HK. Gr3 DD.  . LV (left ventricular) mural thrombus    a. 12/2012 Echo: EF 20% with mural thrombus No evidence of thrombus on 08/2017 echo.  . Macular degeneration   . Myocardial infarct ([place])    2003  . PAF (paroxysmal atrial fibrillation) (HCC)    a. CHA2DS2VASc = 5-->Xarelto/Tikosyn.   Past Surgical History:  Procedure Laterality Date  . CARDIAC CATHETERIZATION N/A 09/20/2014   Procedure: Left Heart Cath and Coronary Angiography;  Surgeon: Jettie Booze, MD; LAD 95%, D1 100%, CFX liner percent, OM 200%, OM 390%, RCA 90%, LIMA-LAD okay, SVG-OM 2-OM 3 minimal disease, SVG-RPDA-RPLB 100% between the RPDA and RPL      . CARDIAC CATHETERIZATION N/A 09/20/2014   Procedure: Coronary Stent Intervention;  Surgeon: Jettie Booze, MD; Synergy DES 4 x 24 mm reducing the stenosis to 5%   . CARDIAC DEFIBRILLATOR PLACEMENT  01/17/03   6949 lead. Medtronic. remote-no; with later revision  . CARDIOVERSION N/A 05/22/2016   Procedure: CARDIOVERSION;  Surgeon: Lelon Perla, MD;  [facility] END[facility]COPY;  Service: Cardiovascular;  Laterality: N/A;  . CARDIOVERSION N/A 08/10/2017   Procedure: CARDIOVERSION;  Surgeon: Pixie Casino, MD;  Location: [facility] ENDOSCOPY;  Service: Cardiovascular;  Laterality: N/A;  . CATARACT EXTRACTION W/ INTRAOCULAR LENS  IMPLANT, BILATERAL Bilateral June/-July 2009   Dr. Katy Fitch  . CATARACT EXTRACTION W/PHACO Bilateral 2010   Dr. Katy Fitch  . COLON RESECTION  02/09/2011   Procedure: LAPAROSCOPIC SIGMOID COLON RESECTION;  Surgeon: Pedro Earls, MD;  Location: [facility] ORS;  Service: General;  Laterality: N/A;  Laparoscopic Assisted Sigmoid Colectomy  . COLON SURGERY    . COLONOSCOPY  08/31/2011   Procedure: COLONOSCOPY;  Surgeon: Jerene Bears, MD;  Location: [facility] ENDOSCOPY;  Service: Gastroenterology;  Laterality: N/A;  . COLONOSCOPY N/A 09/05/2012   Procedure: COLONOSCOPY;  Surgeon: Jerene Bears, MD;  Location: [facility] ENDOSCOPY;  Service: Gastroenterology;  Laterality: N/A;  . COLONOSCOPY N/A 04/18/2013   Procedure: COLONOSCOPY;  Surgeon: Jerene Bears, MD;  Location: [facility] ENDOSCOPY;  Service: Gastroenterology;  Laterality: N/A;  . COLONOSCOPY N/A 04/09/2014   Procedure: COLONOSCOPY;  Surgeon: Jerene Bears, MD;  Location: [facility] ENDOSCOPY;  Service: Gastroenterology;  Laterality: N/A;  . COLONOSCOPY WITH PROPOFOL N/A 05/03/2017   Procedure: COLONOSCOPY WITH PROPOFOL;  Surgeon: Jerene Bears, MD;  Location: [facility] ENDOSCOPY;  Service: Gastroenterology;  Laterality: N/A;  . CORONARY ANGIOPLASTY    . CORONARY ARTERY BYPASS GRAFT  01/2002   LIMA-LAD, SVG-OM 2-OM 3, SVG-RPDA-RPLB  . CORONARY STENT INTERVENTION N/A  11/22/2018   Procedure: CORONARY STENT INTERVENTION;  Surgeon: Nelva Bush, MD;  Location: [place] CV LAB;  Service: Cardiovascular;  Laterality: N/A;  SVG - RCA  . ICD GENERATOR CHANGE  2010   Medtronic Virtuoso II VR ICD  . ICD GENERATOR CHANGEOUT N/A 12/07/2018   Procedure: ICD GENERATOR CHANGEOUT;  Surgeon: Deboraha Sprang, MD;  Location: [place] [facility] LAB;  Service: Cardiovascular;  Laterality: N/A;  . INGUINAL HERNIA REPAIR Right 2000's X 2  . LAPAROSCOPIC RIGHT HEMI COLECTOMY N/A 11/04/2012   Procedure: LAPAROSCOPIC RIGHT HEMI COLECTOMY;  Surgeon: Pedro Earls, MD;  Location: [facility] ORS;  Service: General;  Laterality: N/A;  . LEFT HEART CATH AND CORS/GRAFTS ANGIOGRAPHY Left 11/22/2018   Procedure: LEFT HEART CATH AND CORS/GRAFTS ANGIOGRAPHY;  Surgeon: Nelva Bush, MD;  Location: [place] [facility] LAB;  Service: Cardiovascular;  Laterality: Left;  . TONSILLECTOMY  ~ 1950   Family History  Problem Relation Age of Onset  . Hypertension Father   . Hyperlipidemia Father   . Heart disease Father   . Prostate cancer Father   . Alzheimer's disease Mother   . Hypertension Sister   . Hyperlipidemia Sister   . Colon cancer Neg Hx   . Esophageal cancer Neg Hx   . Rectal cancer Neg Hx   . Stomach cancer Neg Hx    Social History   Socioeconomic History  . Marital status: Widowed    Spouse name: Not on file  . Number of children: 3  . Years of education: Not on file  . Highest education level: Not on file  Occupational History  . Occupation: self Physiological scientist  Tobacco Use  . Smoking status: Never Smoker  . Smokeless tobacco: Never Used  Vaping Use  . Vaping Use: Never used  Substance and Sexual Activity  . Alcohol use: Yes    Alcohol/week: 2.0 standard drinks    Types: 2 Cans of beer per week  . Drug use: No  . Sexual activity: Not Currently  Other Topics Concern  . Not on file  Social History Narrative   Full time. Married.    Social Determinants of Health    Financial Resource Strain: Low Risk   . Difficulty of Paying Living Expenses: Not hard at all  Food Insecurity: No Food Insecurity  . Worried About [name] fundraiser in the Last Year: Never true  . Ran Out of Food in the Last Year: Never true  Transportation Needs: No Transportation Needs  . Lack of Transportation (Medical): No  . Lack of Transportation (Non-Medical): No  Physical Activity: Insufficiently Active  . Days of Exercise per  Week: 2 days  . Minutes of Exercise per Session: 60 min  Stress: No Stress Concern Present  . Feeling of Stress : Not at all  Social Connections: Not on file    Tobacco Counseling Counseling given: Not Answered   Clinical Intake:  Pre-visit preparation completed: Yes  Pain : No/denies pain     Nutritional Risks: None Diabetes: No  How often do you need to have someone help you when you read instructions, pamphlets, or other written materials from your doctor or pharmacy?: 1 - Never What is the last grade level you completed in school?: [place]; 2 years of college courses  Diabetic? no  Interpreter Needed?: No  Information entered by :: Lisette Abu, LPN   Activities of Daily Living In your present state of health, do you have any difficulty performing the following activities: 06/20/2020  Hearing? Y  Comment wears hearing aids  Vision? Y  Comment legally blind  Difficulty concentrating or making decisions? N  Walking or climbing stairs? Y  Comment lives on main level of home  Dressing or bathing? Y  Doing errands, shopping? Y  Preparing Food and eating ? Y  Using the Toilet? N  In the past six months, have you accidently leaked urine? N  Do you have problems with loss of bowel control? N  Managing your Medications? Y  Managing your Finances? N  Housekeeping or managing your Housekeeping? Y  Some recent data might be hidden    Patient Care Team: [name], MD as PCP - General (Internal  Medicine) Deboraha Sprang, MD as PCP - Electrophysiology (Cardiology) Jerline Pain, MD as PCP - Cardiology (Cardiology) Zadie Rhine Clent Demark, MD as Consulting Physician (Ophthalmology) Short, Laurie Panda, RN as Registered Nurse Pyrtle, Lajuan Lines, MD as Consulting Physician (Gastroenterology) Charlton Haws, [facility] as Pharmacist (Pharmacist)  Indicate any recent Medical Services you may have received from other than [facility] providers in the past year (date may be approximate).     Assessment:   This is a routine wellness examination for [place].  Hearing/Vision screen No exam data present  Dietary issues and exercise activities discussed: Current Exercise Habits: Home exercise routine, Type of exercise: Other - see comments (physical therapy), Time (Minutes): 60, Frequency (Times/Week): 2, Weekly Exercise (Minutes/Week): 120, Intensity: Moderate, Exercise limited by: cardiac condition(s)  Goals    . Patient Stated     Maintain current healthy status and stay as independent as possible.    Marland Kitchen Pharmacy Care Plan     CARE PLAN ENTRY (see longitudinal plan of care for additional care plan information)  Current Barriers:  . Chronic Disease Management support, education, and care coordination needs related to Hypertension, Hyperlipidemia, Atrial Fibrillation, Heart Failure, and Coronary Artery Disease   Hypertension / Heart Failure BP Readings from Last 3 Encounters:  10/04/19 118/76  08/22/19 (!) 112/58  08/16/19 120/70 .  Pharmacist Clinical Goal(s): o Over the next 120 days, patient will work with PharmD and providers to maintain BP goal <140/90 . Current regimen:  o carvedilol 6.25 mg twice a day o isosorbide MN 30 mg daily,  o furosemide 40 mg daily,  o potassium chloride 10 mEq once daily . Interventions: o Discussed BP goals and benefits of medication for prevention of heart attack / stroke . Patient self care activities - Over the next 120 days, patient will: o Check BP 2-3 times  weekly, document, and provide at future appointments o Ensure daily salt intake < 2300  mg/day  Hyperlipidemia / ASCVD Lab Results  Component Value Date/Time   LDLCALC 60 06/07/2019 05:40 AM   LDLCALC 102 (H) 11/16/2018 09:00 AM   LDLDIRECT 104 (H) 03/28/2019 10:18 AM .  Pharmacist Clinical Goal(s): o Over the next 120 days, patient will work with PharmD and providers to achieve LDL goal < 70 . Current regimen:  o atorvastatin 80 mg daily o ezetimibe 10 mg daily,  o clopidogrel 75 mg daily,  o isosorbide MN 30 mg daily,  o nitroglycerin 0.4 mg as needed . Interventions: o Discussed cholesterol goals and benefits of medication for prevention of heart attack / stroke . Patient self care activities - Over the next 120 days, patient will: o Continue current medications o Continue low cholesterol diet  Atrial fibrillation . Pharmacist Clinical Goal(s) o Over the next 120 days, patient will work with PharmD and providers to optimize therapy . Current regimen:  o carvedilol 6.25 mg twice a day  o Xarelto 20 mg daily . Interventions: o Discussed benefits of Xarelto for stroke prevention o Discussed cost savings programs for Xarelto if cost is too high in coverage gap ("donut hole") . Patient self care activities - Over the next 120 days, patient will: o Continue medication as prescribed o Contact pharmacist if cost issues arise  Medication management . Pharmacist Clinical Goal(s): o Over the next 120 days, patient will work with PharmD and providers to maintain optimal medication adherence . Current pharmacy: [facility] mail order, [facility] . Interventions o Comprehensive medication review performed. o Continue current medication management strategy . Patient self care activities - Over the next 120 days, patient will: o Focus on medication adherence by pill box o Take medications as prescribed o Report any questions or concerns to PharmD and/or provider(s)  Please see past  updates related to this goal by clicking on the "Past Updates" button in the selected goal       Depression Screen PHQ 2/9 Scores 06/20/2020 03/18/2020 02/15/2019 08/26/2018 01/17/2018 01/15/2017 01/10/2016  PHQ - 2 Score 0 0 0 0 0 0 0    Fall Risk Fall Risk  06/20/2020 07/27/2019 07/20/2019 02/15/2019 08/26/2018  Falls in the past year? 0 0 1 0 0  Number falls in past yr: 0 0 - 0 -  Injury with Fall? 0 0 - 0 -  Risk for fall due to : Impaired vision - - - -  Follow up Falls evaluation completed - - Falls prevention discussed -    FALL RISK PREVENTION PERTAINING TO THE HOME:  Any stairs in or around the home? Yes  If so, are there any without handrails? No  Home free of loose throw rugs in walkways, pet beds, electrical cords, etc? Yes  Adequate lighting in your home to reduce risk of falls? Yes   ASSISTIVE DEVICES UTILIZED TO PREVENT FALLS:  Life alert? No  Use of a cane, walker or w/c? Yes  Grab bars in the bathroom? Yes  Shower chair or bench in shower? Yes  Elevated toilet seat or a handicapped toilet? Yes   TIMED UP AND GO:  Was the test performed? No .  Length of time to ambulate 10 feet: 0 sec.   Gait slow and steady with assistive device  Cognitive Function: No flowsheet data found.         Immunizations Immunization History  Administered Date(s) Administered  . Fluad Quad(high Dose 65+) 12/14/2018, 01/24/2020  . Influenza, High Dose Seasonal PF 01/10/2016, 01/17/2018  . Influenza,inj,Quad PF,6+  Mos 01/04/2015  . Influenza-Unspecified 12/08/2013, 12/07/2016  . Moderna Sars-Covid-2 Vaccination 04/14/2019, 05/16/2019, 02/15/2020  . Pneumococcal Conjugate-13 01/04/2015  . Pneumococcal-Unspecified 12/28/2012  . Tdap 12/29/2010  . Zoster 10/12/2014    TDAP status: Up to date  Flu Vaccine status: Up to date  Pneumococcal vaccine status: Up to date  Covid-19 vaccine status: Completed vaccines  Qualifies for Shingles Vaccine? Yes   Zostavax completed Yes    Shingrix Completed?: No.    Education has been provided regarding the importance of this vaccine. Patient has been advised to call insurance company to determine out of pocket expense if they have not yet received this vaccine. Advised may also receive vaccine at local pharmacy or Health Dept. Verbalized acceptance and understanding.  Screening Tests Health Maintenance  Topic Date Due  . Hepatitis C Screening  Never done  . COLONOSCOPY (Pts 45-3yrs Insurance coverage will need to be confirmed)  05/03/2020  . TETANUS/TDAP  12/28/2020  . INFLUENZA VACCINE  Completed  . COVID-19 Vaccine  Completed  . PNA vac Low Risk Adult  Completed  . HPV VACCINES  Aged Out    Health Maintenance  Health Maintenance Due  Topic Date Due  . Hepatitis C Screening  Never done  . COLONOSCOPY (Pts 45-106yrs Insurance coverage will need to be confirmed)  05/03/2020    Colorectal cancer screening: Type of screening: Colonoscopy. Completed 05/03/2017. Repeat every 3 years  Lung Cancer Screening: (Low Dose CT Chest recommended if Age 57-80 years, 30 pack-year currently smoking OR have quit w/in 15years.) does not qualify.   Lung Cancer Screening Referral: no  Additional Screening:  Hepatitis C Screening: does qualify; Completed no  Vision Screening: Recommended annual ophthalmology exams for early detection of glaucoma and other disorders of the eye. Is the patient up to date with their annual eye exam?  Yes  Who is the provider or what is the name of the office in which the patient attends annual eye exams? Deloria Lair, MD. If pt is not established with a provider, would they like to be referred to a provider to establish care? No .   Dental Screening: Recommended annual dental exams for proper oral hygiene  Community Resource Referral / Chronic Care Management: CRR required this visit?  No   CCM required this visit?  No      Plan:     I have personally reviewed and noted the following in the  patient's chart:   . Medical and social history . Use of alcohol, tobacco or illicit drugs  . Current medications and supplements . Functional ability and status . Nutritional status . Physical activity . Advanced directives . List of other physicians . Hospitalizations, surgeries, and ER visits in previous 12 months . Vitals . Screenings to include cognitive, depression, and falls . Referrals and appointments  In addition, I have reviewed and discussed with patient certain preventive protocols, quality metrics, and best practice recommendations. A written personalized care plan for preventive services as well as general preventive health recommendations were provided to patient.     Sheral Flow, LPN   [id number]/79/[id number]   Nurse Notes:  Patient is cogitatively intact. There were no vitals filed for this visit. There is no height or weight on file to calculate BMI. Medications reviewed with patient; no opioid use noted.

## 2020-06-20 NOTE — Patient Instructions (Signed)
Mark Clayton , Thank you for taking time to come for your Medicare Wellness Visit. I appreciate your ongoing commitment to your health goals. Please review the following plan we discussed and let me know if I can assist you in the future.   Screening recommendations/referrals: Colonoscopy: 05/03/2017; DUE EVERY 3 YEARS Recommended yearly ophthalmology/optometry visit for glaucoma screening and checkup Recommended yearly dental visit for hygiene and checkup  Vaccinations: Influenza vaccine: 01/24/2020 Pneumococcal vaccine: 12/28/2012, 01/04/2015 Tdap vaccine: 12/29/2010; DUE EVERY 10 YEARS Shingles vaccine: NEVER DONE   Covid-19: MODERNA 04/14/2019, 05/16/2019, 02/15/2020  Advanced directives: Please bring a copy of your health care power of attorney and living will to the office at your convenience.  Conditions/risks identified: Yes; Reviewed health maintenance screenings with patient today and relevant education, vaccines, and/or referrals were provided. Please continue to do your personal lifestyle choices by: daily care of teeth and gums, regular physical activity (goal should be 5 days a week for 30 minutes), eat a healthy diet, avoid tobacco and drug use, limiting any alcohol intake, taking a low-dose aspirin (if not allergic or have been advised by your provider otherwise) and taking vitamins and minerals as recommended by your provider. Continue doing brain stimulating activities (puzzles, reading, adult coloring books, staying active) to keep memory sharp. Continue to eat heart healthy diet (full of fruits, vegetables, whole grains, lean protein, water--limit salt, fat, and sugar intake) and increase physical activity as tolerated.  Next appointment: Please schedule your next Medicare Wellness Visit with your Nurse Health Advisor in 1 year by calling [phone].  Preventive Care 56 Years and Older, Male Preventive care refers to lifestyle choices and visits with your health care provider that  can promote health and wellness. What does preventive care include?  A yearly physical exam. This is also called an annual well check.  Dental exams once or twice a year.  Routine eye exams. Ask your health care provider how often you should have your eyes checked.  Personal lifestyle choices, including:  Daily care of your teeth and gums.  Regular physical activity.  Eating a healthy diet.  Avoiding tobacco and drug use.  Limiting alcohol use.  Practicing safe sex.  Taking low doses of aspirin every day.  Taking vitamin and mineral supplements as recommended by your health care provider. What happens during an annual well check? The services and screenings done by your health care provider during your annual well check will depend on your age, overall health, lifestyle risk factors, and family history of disease. Counseling  Your health care provider may ask you questions about your:  Alcohol use.  Tobacco use.  Drug use.  Emotional well-being.  Home and relationship well-being.  Sexual activity.  Eating habits.  History of falls.  Memory and ability to understand (cognition).  Work and work Statistician. Screening  You may have the following tests or measurements:  Height, weight, and BMI.  Blood pressure.  Lipid and cholesterol levels. These may be checked every 5 years, or more frequently if you are over 30 years old.  Skin check.  Lung cancer screening. You may have this screening every year starting at age 32 if you have a 30-pack-year history of smoking and currently smoke or have quit within the past 15 years.  Fecal occult blood test (FOBT) of the stool. You may have this test every year starting at age 60.  Flexible sigmoidoscopy or colonoscopy. You may have a sigmoidoscopy every 5 years or a colonoscopy every 10 years  starting at age 33.  Prostate cancer screening. Recommendations will vary depending on your family history and other  risks.  Hepatitis C blood test.  Hepatitis B blood test.  Sexually transmitted disease (STD) testing.  Diabetes screening. This is done by checking your blood sugar (glucose) after you have not eaten for a while (fasting). You may have this done every 1-3 years.  Abdominal aortic aneurysm (AAA) screening. You may need this if you are a current or former smoker.  Osteoporosis. You may be screened starting at age 61 if you are at high risk. Talk with your health care provider about your test results, treatment options, and if necessary, the need for more tests. Vaccines  Your health care provider may recommend certain vaccines, such as:  Influenza vaccine. This is recommended every year.  Tetanus, diphtheria, and acellular pertussis (Tdap, Td) vaccine. You may need a Td booster every 10 years.  Zoster vaccine. You may need this after age 6.  Pneumococcal 13-valent conjugate (PCV13) vaccine. One dose is recommended after age 67.  Pneumococcal polysaccharide (PPSV23) vaccine. One dose is recommended after age 35. Talk to your health care provider about which screenings and vaccines you need and how often you need them. This information is not intended to replace advice given to you by your health care provider. Make sure you discuss any questions you have with your health care provider. Document Released: 04/12/2015 Document Revised: 12/04/2015 Document Reviewed: 01/15/2015 Elsevier Interactive Patient Education  2017 [place] Prevention in the Home Falls can cause injuries. They can happen to people of all ages. There are many things you can do to make your home safe and to help prevent falls. What can I do on the outside of my home?  Regularly fix the edges of walkways and driveways and fix any cracks.  Remove anything that might make you trip as you walk through a door, such as a raised step or threshold.  Trim any bushes or trees on the path to your home.  Use  bright outdoor lighting.  Clear any walking paths of anything that might make someone trip, such as rocks or tools.  Regularly check to see if handrails are loose or broken. Make sure that both sides of any steps have handrails.  Any raised decks and porches should have guardrails on the edges.  Have any leaves, snow, or ice cleared regularly.  Use sand or salt on walking paths during winter.  Clean up any spills in your garage right away. This includes oil or grease spills. What can I do in the bathroom?  Use night lights.  Install grab bars by the toilet and in the tub and shower. Do not use towel bars as grab bars.  Use non-skid mats or decals in the tub or shower.  If you need to sit down in the shower, use a plastic, non-slip stool.  Keep the floor dry. Clean up any water that spills on the floor as soon as it happens.  Remove soap buildup in the tub or shower regularly.  Attach bath mats securely with double-sided non-slip rug tape.  Do not have throw rugs and other things on the floor that can make you trip. What can I do in the bedroom?  Use night lights.  Make sure that you have a light by your bed that is easy to reach.  Do not use any sheets or blankets that are too big for your bed. They should not hang down  onto the floor.  Have a firm chair that has side arms. You can use this for support while you get dressed.  Do not have throw rugs and other things on the floor that can make you trip. What can I do in the kitchen?  Clean up any spills right away.  Avoid walking on wet floors.  Keep items that you use a lot in easy-to-reach places.  If you need to reach something above you, use a strong step stool that has a grab bar.  Keep electrical cords out of the way.  Do not use floor polish or wax that makes floors slippery. If you must use wax, use non-skid floor wax.  Do not have throw rugs and other things on the floor that can make you trip. What can  I do with my stairs?  Do not leave any items on the stairs.  Make sure that there are handrails on both sides of the stairs and use them. Fix handrails that are broken or loose. Make sure that handrails are as long as the stairways.  Check any carpeting to make sure that it is firmly attached to the stairs. Fix any carpet that is loose or worn.  Avoid having throw rugs at the top or bottom of the stairs. If you do have throw rugs, attach them to the floor with carpet tape.  Make sure that you have a light switch at the top of the stairs and the bottom of the stairs. If you do not have them, ask someone to add them for you. What else can I do to help prevent falls?  Wear shoes that:  Do not have high heels.  Have rubber bottoms.  Are comfortable and fit you well.  Are closed at the toe. Do not wear sandals.  If you use a stepladder:  Make sure that it is fully opened. Do not climb a closed stepladder.  Make sure that both sides of the stepladder are locked into place.  Ask someone to hold it for you, if possible.  Clearly mark and make sure that you can see:  Any grab bars or handrails.  First and last steps.  Where the edge of each step is.  Use tools that help you move around (mobility aids) if they are needed. These include:  Canes.  Walkers.  Scooters.  Crutches.  Turn on the lights when you go into a dark area. Replace any light bulbs as soon as they burn out.  Set up your furniture so you have a clear path. Avoid moving your furniture around.  If any of your floors are uneven, fix them.  If there are any pets around you, be aware of where they are.  Review your medicines with your doctor. Some medicines can make you feel dizzy. This can increase your chance of falling. Ask your doctor what other things that you can do to help prevent falls. This information is not intended to replace advice given to you by your health care provider. Make sure you  discuss any questions you have with your health care provider. Document Released: 01/10/2009 Document Revised: 08/22/2015 Document Reviewed: 04/20/2014 Elsevier Interactive Patient Education  2017 [employer].

## 2020-06-25 ENCOUNTER — Telehealth: Payer: Self-pay | Admitting: Internal Medicine

## 2020-06-25 MED ORDER — MECLIZINE HCL 25 MG PO TABS: 25.0000 mg | ORAL_TABLET | Freq: Every day | ORAL | 2 refills | Status: DC | PRN

## 2020-06-25 NOTE — Telephone Encounter (Signed)
1.Medication Requested: meclizine (ANTIVERT) 25 MG tablet    2. Pharmacy (Name, Street, [place]): [facility], [place]  3. On Med List: no   4. Last Visit with PCP: 05/23/20  5. Next visit date with PCP: n/a    Agent: Please be advised that RX refills may take up to 3 business days. We ask that you follow-up with your pharmacy.

## 2020-06-25 NOTE — Telephone Encounter (Signed)
Medication has been sent to the patient's pharmacy.  

## 2020-06-25 NOTE — Telephone Encounter (Signed)
Ok to refill 

## 2020-06-25 NOTE — Telephone Encounter (Signed)
Ok to refill? Please advise.  

## 2020-07-01 ENCOUNTER — Telehealth: Payer: Self-pay | Admitting: Cardiology

## 2020-07-01 ENCOUNTER — Encounter: Payer: Self-pay | Admitting: Internal Medicine

## 2020-07-01 ENCOUNTER — Telehealth: Payer: Self-pay | Admitting: Internal Medicine

## 2020-07-01 NOTE — Telephone Encounter (Signed)
The patient has been taking Aldactone 25 mg daily since appointment with Dr. Marlou Porch in January 2022 (it was dictated 12.5 mg daily but 25 mg daily was called in and the patient confirmed he is taking 25 mg daily). Shortly after starting the medication, he started experiencing "pretty bad constipation" bad enough to take laxatives, enemas or suppositories at least 2 times a week. He didn't associate the constipation with aldactone until recently when he looked up all his meds online and constipation is listed as a side effect. He does not know any BP readings, but at therapy every week they tell him his BP is "perfect." He states he did not take aldactone today and he will not take it anymore.  He understands he will be called with Dr. Marlou Porch' recommendations.

## 2020-07-01 NOTE — Telephone Encounter (Signed)
Error

## 2020-07-01 NOTE — Telephone Encounter (Signed)
See below

## 2020-07-01 NOTE — Telephone Encounter (Signed)
Pt c/o medication issue:  1. Name of Medication: spironolactone (ALDACTONE) 25 MG tablet  2. How are you currently taking this medication (dosage and times per day)? As prescribed   3. Are you having a reaction (difficulty breathing--STAT)? Yes   4. What is your medication issue?   He states he has to take laxatives twice a week because this medication is causing constipation. He would like to know if he is eligible for an alternative.

## 2020-07-01 NOTE — Telephone Encounter (Signed)
Mark Clayton a physical therapist with [facility] ([facility] calling requesting verbal orders, requesting 1 week 1 starting 04.03.22. He would like to get to the patient today so he can get them re certified.  [phone] Okay to lvm

## 2020-07-02 NOTE — Telephone Encounter (Signed)
Clarification - Aldactone will be removed from list due to constipation. The patient reports his bowel habits are already better after 2 days off the aldactone.   He will monitor BP and call if BP becomes elevated. He was grateful for call and agrees with plan.

## 2020-07-02 NOTE — Telephone Encounter (Signed)
Thanks for update OK to take losartan off of his list - constipation Continue to watch BP Candee Furbish, MD

## 2020-07-04 ENCOUNTER — Ambulatory Visit: Payer: Medicare Other | Admitting: Internal Medicine

## 2020-07-04 NOTE — Telephone Encounter (Signed)
Spoke with Elta Guadeloupe to give verbal orders. No questions or concerns.

## 2020-07-04 NOTE — Telephone Encounter (Signed)
Fine for verbals °

## 2020-07-05 ENCOUNTER — Encounter: Payer: Self-pay | Admitting: Internal Medicine

## 2020-07-05 ENCOUNTER — Other Ambulatory Visit: Payer: Self-pay

## 2020-07-05 ENCOUNTER — Ambulatory Visit (INDEPENDENT_AMBULATORY_CARE_PROVIDER_SITE_OTHER): Payer: Medicare Other | Admitting: Internal Medicine

## 2020-07-05 DIAGNOSIS — R34 Anuria and oliguria: Secondary | ICD-10-CM

## 2020-07-05 DIAGNOSIS — I5022 Chronic systolic (congestive) heart failure: Secondary | ICD-10-CM

## 2020-07-05 DIAGNOSIS — I959 Hypotension, unspecified: Secondary | ICD-10-CM | POA: Diagnosis not present

## 2020-07-05 LAB — HEPATIC FUNCTION PANEL
ALT: 17 U/L (ref 0–53)
AST: 18 U/L (ref 0–37)
Albumin: 3.6 g/dL (ref 3.5–5.2)
Alkaline Phosphatase: 105 U/L (ref 39–117)
Bilirubin, Direct: 0.4 mg/dL — ABNORMAL HIGH (ref 0.0–0.3)
Total Bilirubin: 1.3 mg/dL — ABNORMAL HIGH (ref 0.2–1.2)
Total Protein: 6.4 g/dL (ref 6.0–8.3)

## 2020-07-05 LAB — CBC WITH DIFFERENTIAL/PLATELET
Basophils Absolute: 0 10*3/uL (ref 0.0–0.1)
Basophils Relative: 0.8 % (ref 0.0–3.0)
Eosinophils Absolute: 0.1 10*3/uL (ref 0.0–0.7)
Eosinophils Relative: 2.1 % (ref 0.0–5.0)
HCT: 35.5 % — ABNORMAL LOW (ref 39.0–52.0)
Hemoglobin: 11.9 g/dL — ABNORMAL LOW (ref 13.0–17.0)
Lymphocytes Relative: 24.1 % (ref 12.0–46.0)
Lymphs Abs: 1.2 10*3/uL (ref 0.7–4.0)
MCHC: 33.4 g/dL (ref 30.0–36.0)
MCV: 91.2 fl (ref 78.0–100.0)
Monocytes Absolute: 0.6 10*3/uL (ref 0.1–1.0)
Monocytes Relative: 11.6 % (ref 3.0–12.0)
Neutro Abs: 3.1 10*3/uL (ref 1.4–7.7)
Neutrophils Relative %: 61.4 % (ref 43.0–77.0)
Platelets: 160 10*3/uL (ref 150.0–400.0)
RBC: 3.9 Mil/uL — ABNORMAL LOW (ref 4.22–5.81)
RDW: 16.2 % — ABNORMAL HIGH (ref 11.5–15.5)
WBC: 5 10*3/uL (ref 4.0–10.5)

## 2020-07-05 LAB — BASIC METABOLIC PANEL
BUN: 12 mg/dL (ref 6–23)
CO2: 28 mEq/L (ref 19–32)
Calcium: 9.3 mg/dL (ref 8.4–10.5)
Chloride: 96 mEq/L (ref 96–112)
Creatinine, Ser: 1.01 mg/dL (ref 0.40–1.50)
GFR: 70.7 mL/min (ref >60.00)
Glucose, Bld: 85 mg/dL (ref 70–99)
Potassium: 4.3 mEq/L (ref 3.5–5.1)
Sodium: 128 mEq/L — ABNORMAL LOW (ref 135–145)

## 2020-07-05 NOTE — Assessment & Plan Note (Signed)
Relative low comparatively, to continue to monitor at home, and may need to hold lasix further for persistent lower BP and/or orthostatic symptoms

## 2020-07-05 NOTE — Assessment & Plan Note (Signed)
Most likely related to overall low volume despite stopping aldactone recent; will check bmp but suspect mild low volume; pt to hold on lasix scheduled again tomorrow, and restart Mon apr 11.  Will need f/u with PCP early next wk as well, or sooner with even worsening lower urine output and/or BP or orthostatic symptoms.

## 2020-07-05 NOTE — Progress Notes (Signed)
Patient ID: Mark Clayton, male   DOB: 12/04/1940, 80 y.o.   MRN: [id number]        Chief Complaint: low urine output        HPI:  Mark Clayton is a 80 y.o. male here with granddaughter who lives with him, c/o 2 days onset low urine output and darked more orange yellow urine (not urinary frequency as mentioned per staff as reason for visit);  Denies urinary symptoms such as dysuria, frequency, urgency, flank pain, hematuria or n/v, fever, chills.  Did recently have aldactone stopped per Dr Marlou Porch.  Normally takes lasix every other day.  Yesterday after stopped aldactone, has bilateral pedal edema and darker lower volume urine, which itself was resolved with taking the lasix as scheduled - pedal edema resolved and urine became more volume and less dark.  But today seems more dark again, feels weak, lowerurine volume and occasionally dizzy with standing which is not typical for him.  No falls.  SBP has been most often about 140s per per, today has been about 120 or less.  Pt denies chest pain, increased sob or doe, wheezing, orthopnea, PND,  palpitations, dizziness or syncope.  Denies new neuro focal s/s.   Pt denies polydipsia, polyuria, Denies worsening depressive symptoms, suicidal ideation, or panic/        Wt Readings from Last 3 Encounters:  07/05/20 123 lb (55.8 kg)  05/23/20 126 lb 3.2 oz (57.2 kg)  04/24/20 125 lb (56.7 kg)   BP Readings from Last 3 Encounters:  07/05/20 116/80  05/23/20 118/60  04/24/20 104/65         Past Medical History:  Diagnosis Date  . AICD (automatic cardioverter/defibrillator) present 01/17/2003   Medtronic Maximo 7232CX ICD, serial [id number]  . Anemia 02-06-11   takes oral iron  . Arthritis    hands, knees  . CAD (coronary artery disease) 2003   a. h/o MI and CABG in 2003. b. s/p DES to SVG-RPDA-RPLB in 08/2014.  Marland Kitchen Cancer of sigmoid colon ([place]) 2012   a. s/p colon surgery.  . Carotid bruit   . Chronic systolic CHF (congestive heart failure) (HCC)     a. EF 20% in 2014; b. 08/2017 Echo: EF 20-25%, diff HK, Gr3 DD. Triv AI. Mod MR. Sev dil LA. Mildly dil RV w/ mildly reduced RV fxn. Mildly dil RA. Mod TR. PASP 28mHg.  Marland Kitchen Cough   . Exudative age-related macular degeneration of left eye with active choroidal neovascularization ([place]) 07/12/2019  . Exudative age-related macular degeneration, right eye, with inactive choroidal neovascularization ([place]) 07/12/2019  . GERD (gastroesophageal reflux disease) 02-06-11  . HTN (hypertension)   . Hyperlipidemia   . Ischemic cardiomyopathy    a. EF 20% in 2014. (Master study EF >20%); b. 08/2017 Echo: EF 20-25%, diff HK. Gr3 DD.  . LV (left ventricular) mural thrombus    a. 12/2012 Echo: EF 20% with mural thrombus No evidence of thrombus on 08/2017 echo.  . Macular degeneration   . Myocardial infarct ([place])    2003  . PAF (paroxysmal atrial fibrillation) (HCC)    a. CHA2DS2VASc = 5-->Xarelto/Tikosyn.   Past Surgical History:  Procedure Laterality Date  . CARDIAC CATHETERIZATION N/A 09/20/2014   Procedure: Left Heart Cath and Coronary Angiography;  Surgeon: Jettie Booze, MD; LAD 95%, D1 100%, CFX liner percent, OM 200%, OM 390%, RCA 90%, LIMA-LAD okay, SVG-OM 2-OM 3 minimal disease, SVG-RPDA-RPLB 100% between the RPDA and RPL     . CARDIAC  CATHETERIZATION N/A 09/20/2014   Procedure: Coronary Stent Intervention;  Surgeon: Jettie Booze, MD; Synergy DES 4 x 24 mm reducing the stenosis to 5%   . CARDIAC DEFIBRILLATOR PLACEMENT  01/17/03   6949 lead. Medtronic. remote-no; with later revision  . CARDIOVERSION N/A 05/22/2016   Procedure: CARDIOVERSION;  Surgeon: Lelon Perla, MD;  Location: [facility] ENDOSCOPY;  Service: Cardiovascular;  Laterality: N/A;  . CARDIOVERSION N/A 08/10/2017   Procedure: CARDIOVERSION;  Surgeon: Pixie Casino, MD;  Location: [facility] ENDOSCOPY;  Service: Cardiovascular;  Laterality: N/A;  . CATARACT EXTRACTION W/ INTRAOCULAR LENS  IMPLANT, BILATERAL Bilateral June/-July 2009   Dr.  Katy Fitch  . CATARACT EXTRACTION W/PHACO Bilateral 2010   Dr. Katy Fitch  . COLON RESECTION  02/09/2011   Procedure: LAPAROSCOPIC SIGMOID COLON RESECTION;  Surgeon: Pedro Earls, MD;  Location: [facility] ORS;  Service: General;  Laterality: N/A;  Laparoscopic Assisted Sigmoid Colectomy  . COLON SURGERY    . COLONOSCOPY  08/31/2011   Procedure: COLONOSCOPY;  Surgeon: Jerene Bears, MD;  Location: [facility] ENDOSCOPY;  Service: Gastroenterology;  Laterality: N/A;  . COLONOSCOPY N/A 09/05/2012   Procedure: COLONOSCOPY;  Surgeon: Jerene Bears, MD;  Location: [facility] ENDOSCOPY;  Service: Gastroenterology;  Laterality: N/A;  . COLONOSCOPY N/A 04/18/2013   Procedure: COLONOSCOPY;  Surgeon: Jerene Bears, MD;  Location: [facility] ENDOSCOPY;  Service: Gastroenterology;  Laterality: N/A;  . COLONOSCOPY N/A 04/09/2014   Procedure: COLONOSCOPY;  Surgeon: Jerene Bears, MD;  Location: [facility] ENDOSCOPY;  Service: Gastroenterology;  Laterality: N/A;  . COLONOSCOPY WITH PROPOFOL N/A 05/03/2017   Procedure: COLONOSCOPY WITH PROPOFOL;  Surgeon: Jerene Bears, MD;  Location: [facility] ENDOSCOPY;  Service: Gastroenterology;  Laterality: N/A;  . CORONARY ANGIOPLASTY    . CORONARY ARTERY BYPASS GRAFT  01/2002   LIMA-LAD, SVG-OM 2-OM 3, SVG-RPDA-RPLB  . CORONARY STENT INTERVENTION N/A 11/22/2018   Procedure: CORONARY STENT INTERVENTION;  Surgeon: Nelva Bush, MD;  Location: [place] CV LAB;  Service: Cardiovascular;  Laterality: N/A;  SVG - RCA  . ICD GENERATOR CHANGE  2010   Medtronic Virtuoso II VR ICD  . ICD GENERATOR CHANGEOUT N/A 12/07/2018   Procedure: ICD GENERATOR CHANGEOUT;  Surgeon: Deboraha Sprang, MD;  Location: [place] CV LAB;  Service: Cardiovascular;  Laterality: N/A;  . INGUINAL HERNIA REPAIR Right 2000's X 2  . LAPAROSCOPIC RIGHT HEMI COLECTOMY N/A 11/04/2012   Procedure: LAPAROSCOPIC RIGHT HEMI COLECTOMY;  Surgeon: Pedro Earls, MD;  Location: [facility] ORS;  Service: General;  Laterality: N/A;  . LEFT HEART CATH AND CORS/GRAFTS  ANGIOGRAPHY Left 11/22/2018   Procedure: LEFT HEART CATH AND CORS/GRAFTS ANGIOGRAPHY;  Surgeon: Nelva Bush, MD;  Location: [place] CV LAB;  Service: Cardiovascular;  Laterality: Left;  . TONSILLECTOMY  ~ 1950    reports that he has never smoked. He has never used smokeless tobacco. He reports current alcohol use of about 2.0 standard drinks of alcohol per week. He reports that he does not use drugs. family history includes Alzheimer's disease in his mother; Heart disease in his father; Hyperlipidemia in his father and sister; Hypertension in his father and sister; Prostate cancer in his father. Allergies  Allergen Reactions  . Buspar [Buspirone] Other (See Comments)    Fatigue, body aches and shortness of breath   . Latex Rash  . Tape Rash and Other (See Comments)    USE PAPER   Current Outpatient Medications on File Prior to Visit  Medication Sig Dispense Refill  . acetaminophen (TYLENOL) 325  MG tablet Take 1-2 tablets (325-650 mg total) by mouth every 4 (four) hours as needed for mild pain.    Marland Kitchen atorvastatin (LIPITOR) 80 MG tablet TAKE 1 TABLET BY MOUTH  DAILY 90 tablet 3  . carvedilol (COREG) 6.25 MG tablet TAKE 1 TABLET BY MOUTH  TWICE DAILY WITH A MEAL 180 tablet 3  . clopidogrel (PLAVIX) 75 MG tablet Take 1 tablet (75 mg total) by mouth daily. 90 tablet 1  . ezetimibe (ZETIA) 10 MG tablet TAKE 1 TABLET BY MOUTH  DAILY 30 tablet 0  . furosemide (LASIX) 40 MG tablet TAKE 1 TABLET BY MOUTH  DAILY 90 tablet 3  . isosorbide mononitrate (IMDUR) 30 MG 24 hr tablet Take 1 tablet (30 mg total) by mouth daily. 90 tablet 3  . losartan (COZAAR) 50 MG tablet TAKE 1 TABLET BY MOUTH  DAILY 90 tablet 3  . meclizine (ANTIVERT) 25 MG tablet Take 1 tablet (25 mg total) by mouth daily as needed for dizziness. 30 tablet 2  . nitroGLYCERIN (NITROSTAT) 0.4 MG SL tablet DISSOLVE 1 TABLET UNDER THE TONGUE EVERY 5 MINUTES AS  NEEDED FOR CHEST PAIN. MAX  OF 3 TABLETS IN 15 MINUTES. CALL 911 IF PAIN  PERSISTS. 100 tablet 3  . pantoprazole (PROTONIX) 40 MG tablet TAKE 1 TABLET BY MOUTH  DAILY 90 tablet 3  . polyethylene glycol (MIRALAX / GLYCOLAX) 17 g packet Take 17 g by mouth as needed.    . potassium chloride (KLOR-CON) 10 MEQ tablet Take 1 tablet (10 mEq total) by mouth daily. 90 tablet 3  . rivaroxaban (XARELTO) 20 MG TABS tablet Take 1 tablet (20 mg total) by mouth daily with supper. 90 tablet 3  . vitamin B-12 (CYANOCOBALAMIN) 100 MCG tablet Take 100 mcg by mouth daily.      No current facility-administered medications on file prior to visit.        ROS:  All others reviewed and negative.  Objective        PE:  BP 116/80 (BP Location: Left Arm, Patient Position: Sitting, Cuff Size: Large)   Pulse (!) 58   Ht 6' (1.829 m)   Wt 123 lb (55.8 kg)   SpO2 99%   BMI 16.68 kg/m                 Constitutional: Pt appears in NAD               HENT: Head: NCAT.                Right Ear: External ear normal.                 Left Ear: External ear normal.                Eyes: . Pupils are equal, round, and reactive to light. Conjunctivae and EOM are normal               Nose: without d/c or deformity               Neck: Neck supple. Gross normal ROM               Cardiovascular: Normal rate and regular rhythm.                 Pulmonary/Chest: Effort normal and breath sounds without rales or wheezing.                Abd:  Soft, NT, ND, + BS,  no organomegaly               Neurological: Pt is alert. At baseline orientation, motor grossly intact               Skin: Skin is warm. No rashes, no other new lesions, LE edema - none               Psychiatric: Pt behavior is normal without agitation   Micro: none  Cardiac tracings I have personally interpreted today:  none  Pertinent Radiological findings (summarize): none   Lab Results  Component Value Date   WBC 5.0 07/05/2020   HGB 11.9 (L) 07/05/2020   HCT 35.5 (L) 07/05/2020   PLT 160.0 07/05/2020   GLUCOSE 85 07/05/2020   CHOL  121 06/07/2019   TRIG 112 06/07/2019   HDL 39 (L) 06/07/2019   LDLDIRECT 104 (H) 03/28/2019   LDLCALC 60 06/07/2019   ALT 17 07/05/2020   AST 18 07/05/2020   NA 128 (L) 07/05/2020   K 4.3 07/05/2020   CL 96 07/05/2020   CREATININE 1.01 07/05/2020   BUN 12 07/05/2020   CO2 28 07/05/2020   TSH 4.88 (H) 08/26/2018   PSA 1.51 01/15/2017   INR 1.1 06/06/2019   HGBA1C 6.1 (H) 06/07/2019   Assessment/Plan:  Mark Clayton is a 80 y.o. White or Caucasian [1] male with  has a past medical history of AICD (automatic cardioverter/defibrillator) present (01/17/2003), Anemia (02-06-11), Arthritis, CAD (coronary artery disease) (2003), Cancer of sigmoid colon ([place]) (2012), Carotid bruit, Chronic systolic CHF (congestive heart failure) ([place]), Cough, Exudative age-related macular degeneration of left eye with active choroidal neovascularization ([place]) (07/12/2019), Exudative age-related macular degeneration, right eye, with inactive choroidal neovascularization ([place]) (07/12/2019), GERD (gastroesophageal reflux disease) (02-06-11), HTN (hypertension), Hyperlipidemia, Ischemic cardiomyopathy, LV (left ventricular) mural thrombus, Macular degeneration, Myocardial infarct ([place]), and PAF (paroxysmal atrial fibrillation) ([place]).  Low urine output Most likely related to overall low volume despite stopping aldactone recent; will check bmp but suspect mild low volume; pt to hold on lasix scheduled again tomorrow, and restart Mon apr 11.  Will need f/u with PCP early next wk as well, or sooner with even worsening lower urine output and/or BP or orthostatic symptoms.   Hypotension Relative low comparatively, to continue to monitor at home, and may need to hold lasix further for persistent lower BP and/or orthostatic symptoms  Chronic systolic heart failure (HCC) No volume increase today, to continue off aldactone and continue current tx, f/u cardiology   Current Outpatient Medications (Cardiovascular):  .   atorvastatin (LIPITOR) 80 MG tablet, TAKE 1 TABLET BY MOUTH  DAILY .  carvedilol (COREG) 6.25 MG tablet, TAKE 1 TABLET BY MOUTH  TWICE DAILY WITH A MEAL .  ezetimibe (ZETIA) 10 MG tablet, TAKE 1 TABLET BY MOUTH  DAILY .  furosemide (LASIX) 40 MG tablet, TAKE 1 TABLET BY MOUTH  DAILY .  isosorbide mononitrate (IMDUR) 30 MG 24 hr tablet, Take 1 tablet (30 mg total) by mouth daily. Marland Kitchen  losartan (COZAAR) 50 MG tablet, TAKE 1 TABLET BY MOUTH  DAILY .  nitroGLYCERIN (NITROSTAT) 0.4 MG SL tablet, DISSOLVE 1 TABLET UNDER THE TONGUE EVERY 5 MINUTES AS  NEEDED FOR CHEST PAIN. MAX  OF 3 TABLETS IN 15 MINUTES. CALL 911 IF PAIN PERSISTS.   Current Outpatient Medications (Analgesics):  .  acetaminophen (TYLENOL) 325 MG tablet, Take 1-2 tablets (325-650 mg total) by mouth every 4 (four) hours as needed for mild pain.  Current Outpatient Medications (Hematological):  .  clopidogrel (PLAVIX) 75 MG tablet, Take 1 tablet (75 mg total) by mouth daily. .  rivaroxaban (XARELTO) 20 MG TABS tablet, Take 1 tablet (20 mg total) by mouth daily with supper. .  vitamin B-12 (CYANOCOBALAMIN) 100 MCG tablet, Take 100 mcg by mouth daily.   Current Outpatient Medications (Other):  .  meclizine (ANTIVERT) 25 MG tablet, Take 1 tablet (25 mg total) by mouth daily as needed for dizziness. .  pantoprazole (PROTONIX) 40 MG tablet, TAKE 1 TABLET BY MOUTH  DAILY .  polyethylene glycol (MIRALAX / GLYCOLAX) 17 g packet, Take 17 g by mouth as needed. .  potassium chloride (KLOR-CON) 10 MEQ tablet, Take 1 tablet (10 mEq total) by mouth daily.   Followup: Return in about 1 week (around 07/12/2020), or if symptoms worsen or fail to improve, for to Dr Sharlet Salina.  Cathlean Cower, MD 07/05/2020 8:58 PM [facility]

## 2020-07-05 NOTE — Patient Instructions (Signed)
I suspect you are still on the mildly dehydrated side despite stopping the spironlactone recently  Please HOLD on taking the lasix tomorrow, then restart on Monday  In the meantime, we should do blood tests to see if there is more concern about the kidneys and dehydration  Please continue all other medications as before, and refills have been done if requested.  Please have the pharmacy call with any other refills you may need.  Please keep your appointments with your specialists as you may have planned  Please go to the LAB at the blood drawing area for the tests to be done  You will be contacted by phone if any changes need to be made immediately.  Otherwise, you will receive a letter about your results with an explanation, but please check with MyChart first.  Please remember to sign up for MyChart if you have not done so, as this will be important to you in the future with finding out test results, communicating by private email, and scheduling acute appointments online when needed.

## 2020-07-05 NOTE — Assessment & Plan Note (Signed)
No volume increase today, to continue off aldactone and continue current tx, f/u cardiology   Current Outpatient Medications (Cardiovascular):  .  atorvastatin (LIPITOR) 80 MG tablet, TAKE 1 TABLET BY MOUTH  DAILY .  carvedilol (COREG) 6.25 MG tablet, TAKE 1 TABLET BY MOUTH  TWICE DAILY WITH A MEAL .  ezetimibe (ZETIA) 10 MG tablet, TAKE 1 TABLET BY MOUTH  DAILY .  furosemide (LASIX) 40 MG tablet, TAKE 1 TABLET BY MOUTH  DAILY .  isosorbide mononitrate (IMDUR) 30 MG 24 hr tablet, Take 1 tablet (30 mg total) by mouth daily. Marland Kitchen  losartan (COZAAR) 50 MG tablet, TAKE 1 TABLET BY MOUTH  DAILY .  nitroGLYCERIN (NITROSTAT) 0.4 MG SL tablet, DISSOLVE 1 TABLET UNDER THE TONGUE EVERY 5 MINUTES AS  NEEDED FOR CHEST PAIN. MAX  OF 3 TABLETS IN 15 MINUTES. CALL 911 IF PAIN PERSISTS.   Current Outpatient Medications (Analgesics):  .  acetaminophen (TYLENOL) 325 MG tablet, Take 1-2 tablets (325-650 mg total) by mouth every 4 (four) hours as needed for mild pain.  Current Outpatient Medications (Hematological):  .  clopidogrel (PLAVIX) 75 MG tablet, Take 1 tablet (75 mg total) by mouth daily. .  rivaroxaban (XARELTO) 20 MG TABS tablet, Take 1 tablet (20 mg total) by mouth daily with supper. .  vitamin B-12 (CYANOCOBALAMIN) 100 MCG tablet, Take 100 mcg by mouth daily.   Current Outpatient Medications (Other):  .  meclizine (ANTIVERT) 25 MG tablet, Take 1 tablet (25 mg total) by mouth daily as needed for dizziness. .  pantoprazole (PROTONIX) 40 MG tablet, TAKE 1 TABLET BY MOUTH  DAILY .  polyethylene glycol (MIRALAX / GLYCOLAX) 17 g packet, Take 17 g by mouth as needed. .  potassium chloride (KLOR-CON) 10 MEQ tablet, Take 1 tablet (10 mEq total) by mouth daily.

## 2020-07-08 ENCOUNTER — Telehealth: Payer: Self-pay | Admitting: Internal Medicine

## 2020-07-08 NOTE — Telephone Encounter (Signed)
Patients care giver called and was wondering if the patient could go back on potassium chloride (KLOR-CON) 10 MEQ tablet. She said that his BP has been low. She can be reached at [phone]. Please advise

## 2020-07-09 NOTE — Telephone Encounter (Signed)
Patient called and was wondering if he could go back on potassium chloride (KLOR-CON) 10 MEQ tablet. Please advise

## 2020-07-09 NOTE — Telephone Encounter (Signed)
See below

## 2020-07-09 NOTE — Telephone Encounter (Signed)
Potassium does not help with BP. What is BP running and is he dizzy with this?

## 2020-07-10 NOTE — Telephone Encounter (Signed)
No dizziness. His blood pressure is running 136/74. Patient stated that he is feeling ok. No other questions or concerns at this time.

## 2020-07-15 ENCOUNTER — Ambulatory Visit (INDEPENDENT_AMBULATORY_CARE_PROVIDER_SITE_OTHER): Payer: Medicare Other

## 2020-07-15 ENCOUNTER — Ambulatory Visit: Payer: Medicare Other | Admitting: Internal Medicine

## 2020-07-15 DIAGNOSIS — I5022 Chronic systolic (congestive) heart failure: Secondary | ICD-10-CM

## 2020-07-15 DIAGNOSIS — Z95810 Presence of automatic (implantable) cardiac defibrillator: Secondary | ICD-10-CM

## 2020-07-15 NOTE — Progress Notes (Signed)
EPIC Encounter for ICM Monitoring  Patient Name: Mark Clayton is a 80 y.o. male Date: 07/15/2020 Primary Care Physican: [name], MD Cardiologist:[facility] Electrophysiologist: Caryl Comes 4/18/2022Weight: 128lbs  Time in AF:24.0 hr/day (99.9%)(taking Xarelto)    Spoke with patient/granddaughter and reports Dr Jenny Reichmann held Lasix for a couple of days on 4/8-4/11 which results in lower leg edema. Spironolactone was stopped due constipation side effect.         Mark Clayton restarted Lasix 4/11 and swelling has improved. Weight is stable and breathing at baseline.   OptivolThoracic impedancesuggestingpossible fluid accumulation since 4/4.  Prescribed:  Furosemide 40 mg 1 tablet every other day.  Potassium 10 mEq take 1 tablet daily  Labs: 07/05/2020 Creatinine 1.01, BUN 12, Potassium 4.3, Sodium 128, GFR 70.70 05/23/2020 Creatinine 0.96, BUN 13, Potassium 4.4, Sodium 134, GFR 75.21  A complete set of results can be found in Results Review.  Recommendations:Advised to take 1 Furosemide x 2 consecutive days and then resume every other day.    Follow-up plan: ICM clinic phone appointment on4/22/2022 (manual) to recheck fluid levels.91 day device clinic remote transmission4/27/2022.  EP/Cardiology Office Visits:07/30/2020 with Dr Marlou Porch. Recall for 7/16/2022with Dr.Klein.   Copy of ICM check sent to [place]  3 month ICM trend: 07/15/2020.    1 Year ICM trend:       Rosalene Billings, RN 07/15/2020 10:27 AM

## 2020-07-16 ENCOUNTER — Other Ambulatory Visit: Payer: Self-pay | Admitting: Internal Medicine

## 2020-07-16 ENCOUNTER — Encounter: Payer: Self-pay | Admitting: Internal Medicine

## 2020-07-16 ENCOUNTER — Other Ambulatory Visit: Payer: Self-pay

## 2020-07-16 ENCOUNTER — Ambulatory Visit (INDEPENDENT_AMBULATORY_CARE_PROVIDER_SITE_OTHER): Payer: Medicare Other | Admitting: Internal Medicine

## 2020-07-16 VITALS — BP 124/84 | HR 54 | Temp 98.4°F | Resp 18 | Ht 72.0 in

## 2020-07-16 DIAGNOSIS — Z8673 Personal history of transient ischemic attack (TIA), and cerebral infarction without residual deficits: Secondary | ICD-10-CM

## 2020-07-16 DIAGNOSIS — I69351 Hemiplegia and hemiparesis following cerebral infarction affecting right dominant side: Secondary | ICD-10-CM | POA: Diagnosis not present

## 2020-07-16 DIAGNOSIS — I5022 Chronic systolic (congestive) heart failure: Secondary | ICD-10-CM

## 2020-07-16 LAB — COMPREHENSIVE METABOLIC PANEL
ALT: 16 U/L (ref 0–53)
AST: 18 U/L (ref 0–37)
Albumin: 3.8 g/dL (ref 3.5–5.2)
Alkaline Phosphatase: 105 U/L (ref 39–117)
BUN: 14 mg/dL (ref 6–23)
CO2: 29 mEq/L (ref 19–32)
Calcium: 9.6 mg/dL (ref 8.4–10.5)
Chloride: 102 mEq/L (ref 96–112)
Creatinine, Ser: 1.05 mg/dL (ref 0.40–1.50)
GFR: 67.47 mL/min (ref >60.00)
Glucose, Bld: 83 mg/dL (ref 70–99)
Potassium: 3.6 mEq/L (ref 3.5–5.1)
Sodium: 139 mEq/L (ref 135–145)
Total Bilirubin: 1.5 mg/dL — ABNORMAL HIGH (ref 0.2–1.2)
Total Protein: 6.7 g/dL (ref 6.0–8.3)

## 2020-07-16 NOTE — Patient Instructions (Addendum)
We will watch the fluid status closely and check the labs today.    We will get the physical therapy back to the house.

## 2020-07-16 NOTE — Progress Notes (Signed)
   Subjective:   Patient ID: Mark Clayton, male    DOB: 1940-10-04, 80 y.o.   MRN: [id number]  HPI The patient is a 80 YO man coming in for concerns about home PT. He is not getting a lot of PT currently and wants to resume this. Was making good progress and now is declining function. Is working to maintain this himself. Wants to maintain ADLs himself. Lives with granddaughter and has some support. Concerns about recent adjustment to fluid pills and was told to stop and then had to take daily which was hard on his system.   Review of Systems  Constitutional: Negative.   HENT: Negative.   Eyes: Negative.   Respiratory: Negative for cough, chest tightness and shortness of breath.   Cardiovascular: Negative for chest pain, palpitations and leg swelling.  Gastrointestinal: Negative for abdominal distention, abdominal pain, constipation, diarrhea, nausea and vomiting.  Musculoskeletal: Negative.   Skin: Negative.   Neurological: Positive for weakness and numbness.  Psychiatric/Behavioral: Negative.     Objective:  Physical Exam Constitutional:      Appearance: He is well-developed.  HENT:     Head: Normocephalic and atraumatic.  Cardiovascular:     Rate and Rhythm: Normal rate and regular rhythm.  Pulmonary:     Effort: Pulmonary effort is normal. No respiratory distress.     Breath sounds: Normal breath sounds. No wheezing or rales.  Abdominal:     General: Bowel sounds are normal. There is no distension.     Palpations: Abdomen is soft.     Tenderness: There is no abdominal tenderness. There is no rebound.  Musculoskeletal:     Cervical back: Normal range of motion.  Skin:    General: Skin is warm and dry.  Neurological:     Mental Status: He is alert and oriented to person, place, and time. Mental status is at baseline.     Cranial Nerves: Cranial nerve deficit present.     Coordination: Coordination abnormal.     Vitals:   07/16/20 1409  BP: 124/84  Pulse: (!) 54   Resp: 18  Temp: 98.4 F (36.9 C)  TempSrc: Oral  SpO2: 100%  Height: 6' (1.829 m)    This visit occurred during the SARS-CoV-2 public health emergency.  Safety protocols were in place, including screening questions prior to the visit, additional usage of staff PPE, and extensive cleaning of exam room while observing appropriate contact time as indicated for disinfecting solutions.   Assessment & Plan:  Visit time 20 minutes in face to face communication with patient and coordination of care, additional 10 minutes spent in record review, coordination or care, ordering tests, communicating/referring to other healthcare professionals, documenting in medical records all on the same day of the visit for total time 30 minutes spent on the visit.

## 2020-07-17 NOTE — Progress Notes (Signed)
Returned patient call.  He stated he took extra Lasix last night with good results instead of today.  He reports swelling of feet and legs have resolved.  Will send remote transmission on 07/19/2020 for fluid level review.

## 2020-07-19 ENCOUNTER — Encounter: Payer: Self-pay | Admitting: Internal Medicine

## 2020-07-19 ENCOUNTER — Ambulatory Visit (INDEPENDENT_AMBULATORY_CARE_PROVIDER_SITE_OTHER): Payer: Medicare Other

## 2020-07-19 DIAGNOSIS — I5022 Chronic systolic (congestive) heart failure: Secondary | ICD-10-CM

## 2020-07-19 DIAGNOSIS — Z95810 Presence of automatic (implantable) cardiac defibrillator: Secondary | ICD-10-CM

## 2020-07-19 DIAGNOSIS — I69351 Hemiplegia and hemiparesis following cerebral infarction affecting right dominant side: Secondary | ICD-10-CM | POA: Insufficient documentation

## 2020-07-19 NOTE — Assessment & Plan Note (Signed)
Needs to resume PT and home health orders placed as he is unable to leave home independently.

## 2020-07-19 NOTE — Progress Notes (Signed)
EPIC Encounter for ICM Monitoring  Patient Name: Mark Clayton is a 80 y.o. male Date: 07/19/2020 Primary Care Physican: [name], MD Cardiologist:[facility] Electrophysiologist: Caryl Comes 4/18/2022Weight: 128lbs 07/19/2020 Weight: 128 lbs  Time in AF:24.0 hr/day (100.0%)(taking Xarelto)    Spoke with patient and reports feeling well at this time.  Denies fluid symptoms.    OptivolThoracic impedancesuggestingpossible ongoing fluid accumulation since 4/4 but did return to normal x 1 day, 4/18 after taking extra Furosemide.  Prescribed:  Furosemide 40 mg 1 tablet every other day.  Potassium 10 mEq take 1 tablet daily  Labs: 07/16/2020 Creatinine 1.05, BUN 14, Potassium 3.6, Sodium 139, GFR 67.47 07/05/2020 Creatinine 1.01, BUN 12, Potassium 4.3, Sodium 128, GFR 70.70 05/23/2020 Creatinine 0.96, BUN 13, Potassium 4.4, Sodium 134, GFR 75.21  A complete set of results can be found in Results Review.  Recommendations:Recommendation to limit salt intake to 2000 mg daily and fluid intake to 64 oz daily.  Encouraged to call if experiencing any fluid symptoms.   Follow-up plan: ICM clinic phone appointment on5/04/2020 to recheck fluid levels.91 day device clinic remote transmission4/27/2022.  EP/Cardiology Office Visits:07/30/2020 with Dr Marlou Porch. Recall for 7/16/2022with Dr.Klein.   Copy of ICM check sent to [place]   3 month ICM trend: 07/19/2020.    1 Year ICM trend:       Rosalene Billings, RN 07/19/2020 8:04 AM

## 2020-07-19 NOTE — Assessment & Plan Note (Signed)
Checking CMP as recent adjustment to his fluid pill.

## 2020-07-19 NOTE — Assessment & Plan Note (Signed)
Decreased movement and sensation to right side as effect of stroke. Home health and PT ordered today.

## 2020-07-22 DIAGNOSIS — Z950 Presence of cardiac pacemaker: Secondary | ICD-10-CM | POA: Diagnosis not present

## 2020-07-22 DIAGNOSIS — I5022 Chronic systolic (congestive) heart failure: Secondary | ICD-10-CM | POA: Diagnosis not present

## 2020-07-22 DIAGNOSIS — Z7901 Long term (current) use of anticoagulants: Secondary | ICD-10-CM | POA: Diagnosis not present

## 2020-07-22 DIAGNOSIS — M21371 Foot drop, right foot: Secondary | ICD-10-CM | POA: Diagnosis not present

## 2020-07-22 DIAGNOSIS — I69351 Hemiplegia and hemiparesis following cerebral infarction affecting right dominant side: Secondary | ICD-10-CM | POA: Diagnosis not present

## 2020-07-22 DIAGNOSIS — Z7902 Long term (current) use of antithrombotics/antiplatelets: Secondary | ICD-10-CM | POA: Diagnosis not present

## 2020-07-23 ENCOUNTER — Telehealth: Payer: Self-pay | Admitting: Internal Medicine

## 2020-07-23 NOTE — Telephone Encounter (Signed)
Fine for verbals °

## 2020-07-23 NOTE — Telephone Encounter (Signed)
See below

## 2020-07-23 NOTE — Telephone Encounter (Signed)
Mark w/ [facility] is requesting verbals for physical therapy for 2w4 and 1w3 starting 07-22-20. Please advise    Okay to LVM: [phone]

## 2020-07-24 ENCOUNTER — Ambulatory Visit (INDEPENDENT_AMBULATORY_CARE_PROVIDER_SITE_OTHER): Payer: Medicare Other

## 2020-07-24 DIAGNOSIS — I5022 Chronic systolic (congestive) heart failure: Secondary | ICD-10-CM

## 2020-07-24 DIAGNOSIS — I255 Ischemic cardiomyopathy: Secondary | ICD-10-CM

## 2020-07-24 LAB — CUP PACEART REMOTE DEVICE CHECK
Battery Remaining Longevity: 123 mo
Battery Voltage: 3.01 V
Brady Statistic RV Percent Paced: 0.4 %
Date Time Interrogation Session: 20220427012504
HighPow Impedance: 44 Ohm
HighPow Impedance: 55 Ohm
Implantable Lead Implant Date: 20041020
Implantable Lead Implant Date: 20100913
Implantable Lead Location: 753860
Implantable Lead Location: 753860
Implantable Lead Model: 5076
Implantable Lead Model: 6949
Implantable Pulse Generator Implant Date: 20200909
Lead Channel Impedance Value: 304 Ohm
Lead Channel Impedance Value: 456 Ohm
Lead Channel Pacing Threshold Amplitude: 1.125 V
Lead Channel Pacing Threshold Pulse Width: 0.4 ms
Lead Channel Sensing Intrinsic Amplitude: 27.25 mV
Lead Channel Sensing Intrinsic Amplitude: 27.25 mV
Lead Channel Setting Pacing Amplitude: 2.5 V
Lead Channel Setting Pacing Pulse Width: 0.8 ms
Lead Channel Setting Sensing Sensitivity: 0.45 mV

## 2020-07-24 NOTE — Telephone Encounter (Signed)
Unable to get in contact with [facility]. LDVM giving verbals for physical therapy. Office number was provided in case he has additional questions or concerns.

## 2020-07-26 DIAGNOSIS — I69351 Hemiplegia and hemiparesis following cerebral infarction affecting right dominant side: Secondary | ICD-10-CM | POA: Diagnosis not present

## 2020-07-26 DIAGNOSIS — Z7901 Long term (current) use of anticoagulants: Secondary | ICD-10-CM | POA: Diagnosis not present

## 2020-07-26 DIAGNOSIS — Z950 Presence of cardiac pacemaker: Secondary | ICD-10-CM | POA: Diagnosis not present

## 2020-07-26 DIAGNOSIS — Z7902 Long term (current) use of antithrombotics/antiplatelets: Secondary | ICD-10-CM | POA: Diagnosis not present

## 2020-07-26 DIAGNOSIS — I5022 Chronic systolic (congestive) heart failure: Secondary | ICD-10-CM | POA: Diagnosis not present

## 2020-07-26 DIAGNOSIS — M21371 Foot drop, right foot: Secondary | ICD-10-CM | POA: Diagnosis not present

## 2020-07-29 ENCOUNTER — Ambulatory Visit: Payer: Medicare Other | Admitting: Internal Medicine

## 2020-07-29 ENCOUNTER — Ambulatory Visit (INDEPENDENT_AMBULATORY_CARE_PROVIDER_SITE_OTHER): Payer: Medicare Other

## 2020-07-29 DIAGNOSIS — I5022 Chronic systolic (congestive) heart failure: Secondary | ICD-10-CM

## 2020-07-29 DIAGNOSIS — Z95810 Presence of automatic (implantable) cardiac defibrillator: Secondary | ICD-10-CM

## 2020-07-29 NOTE — Progress Notes (Signed)
EPIC Encounter for ICM Monitoring  Patient Name: Mark Clayton is a 80 y.o. male Date: 07/29/2020 Primary Care Physican: [name], MD Cardiologist:[place] Electrophysiologist: Caryl Comes 4/18/2022Weight: 128lbs 07/19/2020 Weight: 128 lbs 07/29/2020 Weight: 128 lbs  Time in AF:24.0 hr/day (100.0%)(taking Xarelto)    Spoke with patient and reports feeling well at this time.  Denies fluid symptoms.    OptivolThoracic impedancesuggestingpossible ongoing fluid accumulation since 4/4 but trending closer to baseline normal.  Prescribed:  Furosemide 40 mg 1 tablet every other day.  Potassium 10 mEq take 1 tablet daily  Labs: 07/16/2020 Creatinine 1.05, BUN 14, Potassium 3.6, Sodium 139, GFR 67.47 07/05/2020 Creatinine1.01, BUN12, Potassium4.3, [id number], GFR70.70 05/23/2020 Creatinine0.96, BUN13, Potassium4.4, Sodium134, SFK81.27 A complete set of results can be found in Results Review.  Recommendations:Recommendation to limit salt intake to 2000 mg daily and fluid intake to 64 oz daily.  Encouraged to call if experiencing any fluid symptoms.   Follow-up plan: ICM clinic phone appointment on6/13/2022.91 day device clinic remote transmission7/27/2022.  EP/Cardiology Office Visits:07/30/2020 with Dr Marlou Porch. Recall for 7/16/2022with Dr.Klein.   Copy of ICM check sent to Dr.Klein and Dr Marlou Porch as Juluis Rainier since patient has OV tomorrow on 07/30/2020.  3 month ICM trend: 07/29/2020.    1 Year ICM trend:       Rosalene Billings, RN 07/29/2020 12:03 PM

## 2020-07-30 ENCOUNTER — Ambulatory Visit: Payer: Medicare Other | Admitting: Cardiology

## 2020-07-30 ENCOUNTER — Other Ambulatory Visit: Payer: Self-pay

## 2020-07-30 ENCOUNTER — Encounter: Payer: Self-pay | Admitting: Cardiology

## 2020-07-30 VITALS — BP 116/70 | HR 66 | Ht 72.0 in | Wt 128.0 lb

## 2020-07-30 DIAGNOSIS — I4819 Other persistent atrial fibrillation: Secondary | ICD-10-CM

## 2020-07-30 DIAGNOSIS — I5022 Chronic systolic (congestive) heart failure: Secondary | ICD-10-CM | POA: Diagnosis not present

## 2020-07-30 DIAGNOSIS — I69351 Hemiplegia and hemiparesis following cerebral infarction affecting right dominant side: Secondary | ICD-10-CM | POA: Diagnosis not present

## 2020-07-30 DIAGNOSIS — M21371 Foot drop, right foot: Secondary | ICD-10-CM | POA: Diagnosis not present

## 2020-07-30 DIAGNOSIS — Z7902 Long term (current) use of antithrombotics/antiplatelets: Secondary | ICD-10-CM | POA: Diagnosis not present

## 2020-07-30 DIAGNOSIS — Z7901 Long term (current) use of anticoagulants: Secondary | ICD-10-CM | POA: Diagnosis not present

## 2020-07-30 DIAGNOSIS — Z950 Presence of cardiac pacemaker: Secondary | ICD-10-CM | POA: Diagnosis not present

## 2020-07-30 NOTE — Progress Notes (Signed)
Cardiology Office Note:    Date:  07/30/2020   ID:  [name], DOB 1941-03-14, MRN [id number]  PCP:  [name], MD   [facility] Providers Cardiologist:  Candee Furbish, MD Electrophysiologist:  Virl Axe, MD     Referring MD: [name], *     History of Present Illness:    Mark Clayton is a 80 y.o. male here for the follow-up of ischemic cardiomyopathy, ICD, permanent atrial fibrillation, prior stroke seen by Dr. Caryl Comes.  In 2020 had left heart catheterization that showed LIMA to LAD, SVG to OM1/OM 2 and SVG to PDA-last of which was intervened upon.  In 2021 echocardiogram showed EF of less than 20% with RV dysfunction as well.  Biventricular failure.  Spironolactone or Aldactone had been stopped because of constipation.  In the past we have talked about Entresto.  He is legally blind, quite hard of hearing.  Past Medical History:  Diagnosis Date  . AICD (automatic cardioverter/defibrillator) present 01/17/2003   Medtronic Maximo 7232CX ICD, serial [id number]  . Anemia 02-06-11   takes oral iron  . Arthritis    hands, knees  . CAD (coronary artery disease) 2003   a. h/o MI and CABG in 2003. b. s/p DES to SVG-RPDA-RPLB in 08/2014.  Marland Kitchen Cancer of sigmoid colon ([place]) 2012   a. s/p colon surgery.  . Carotid bruit   . Chronic systolic CHF (congestive heart failure) (HCC)    a. EF 20% in 2014; b. 08/2017 Echo: EF 20-25%, diff HK, Gr3 DD. Triv AI. Mod MR. Sev dil LA. Mildly dil RV w/ mildly reduced RV fxn. Mildly dil RA. Mod TR. PASP 107mHg.  Marland Kitchen Cough   . Exudative age-related macular degeneration of left eye with active choroidal neovascularization ([place]) 07/12/2019  . Exudative age-related macular degeneration, right eye, with inactive choroidal neovascularization ([place]) 07/12/2019  . GERD (gastroesophageal reflux disease) 02-06-11  . HTN (hypertension)   . Hyperlipidemia   . Ischemic cardiomyopathy    a. EF 20% in 2014. (Master study EF >20%); b.  08/2017 Echo: EF 20-25%, diff HK. Gr3 DD.  . LV (left ventricular) mural thrombus    a. 12/2012 Echo: EF 20% with mural thrombus No evidence of thrombus on 08/2017 echo.  . Macular degeneration   . Myocardial infarct ([place])    2003  . PAF (paroxysmal atrial fibrillation) (HCC)    a. CHA2DS2VASc = 5-->Xarelto/Tikosyn.    Past Surgical History:  Procedure Laterality Date  . CARDIAC CATHETERIZATION N/A 09/20/2014   Procedure: Left Heart Cath and Coronary Angiography;  Surgeon: Jettie Booze, MD; LAD 95%, D1 100%, CFX liner percent, OM 200%, OM 390%, RCA 90%, LIMA-LAD okay, SVG-OM 2-OM 3 minimal disease, SVG-RPDA-RPLB 100% between the RPDA and RPL     . CARDIAC CATHETERIZATION N/A 09/20/2014   Procedure: Coronary Stent Intervention;  Surgeon: Jettie Booze, MD; Synergy DES 4 x 24 mm reducing the stenosis to 5%   . CARDIAC DEFIBRILLATOR PLACEMENT  01/17/03   6949 lead. Medtronic. remote-no; with later revision  . CARDIOVERSION N/A 05/22/2016   Procedure: CARDIOVERSION;  Surgeon: Lelon Perla, MD;  Location[facility] END[facility]COPY;  Service: Cardiovascular;  Laterality: N/A;  . CARDIOVERSION N/A 08/10/2017   Procedure: CARDIOVERSION;  Surgeon: Pixie Casino, MD;  Location: [facility] ENDOSCOPY;  Service: Cardiovascular;  Laterality: N/A;  . CATARACT EXTRACTION W/ INTRAOCULAR LENS  IMPLANT, BILATERAL Bilateral June/-July 2009   Dr. Katy Fitch  . CATARACT EXTRACTION W/PHACO Bilateral 2010   Dr.  Groat  . COLON RESECTION  02/09/2011   Procedure: LAPAROSCOPIC SIGMOID COLON RESECTION;  Surgeon: Pedro Earls, MD;  Location: [facility] ORS;  Service: General;  Laterality: N/A;  Laparoscopic Assisted Sigmoid Colectomy  . COLON SURGERY    . COLONOSCOPY  08/31/2011   Procedure: COLONOSCOPY;  Surgeon: Jerene Bears, MD;  Location: [facility] ENDOSCOPY;  Service: Gastroenterology;  Laterality: N/A;  . COLONOSCOPY N/A 09/05/2012   Procedure: COLONOSCOPY;  Surgeon: Jerene Bears, MD;  Location: [facility] ENDOSCOPY;  Service:  Gastroenterology;  Laterality: N/A;  . COLONOSCOPY N/A 04/18/2013   Procedure: COLONOSCOPY;  Surgeon: Jerene Bears, MD;  Location: [facility] ENDOSCOPY;  Service: Gastroenterology;  Laterality: N/A;  . COLONOSCOPY N/A 04/09/2014   Procedure: COLONOSCOPY;  Surgeon: Jerene Bears, MD;  Location: [facility] ENDOSCOPY;  Service: Gastroenterology;  Laterality: N/A;  . COLONOSCOPY WITH PROPOFOL N/A 05/03/2017   Procedure: COLONOSCOPY WITH PROPOFOL;  Surgeon: Jerene Bears, MD;  Location: [facility] ENDOSCOPY;  Service: Gastroenterology;  Laterality: N/A;  . CORONARY ANGIOPLASTY    . CORONARY ARTERY BYPASS GRAFT  01/2002   LIMA-LAD, SVG-OM 2-OM 3, SVG-RPDA-RPLB  . CORONARY STENT INTERVENTION N/A 11/22/2018   Procedure: CORONARY STENT INTERVENTION;  Surgeon: Nelva Bush, MD;  Location: [place] CV LAB;  Service: Cardiovascular;  Laterality: N/A;  SVG - RCA  . ICD GENERATOR CHANGE  2010   Medtronic Virtuoso II VR ICD  . ICD GENERATOR CHANGEOUT N/A 12/07/2018   Procedure: ICD GENERATOR CHANGEOUT;  Surgeon: Deboraha Sprang, MD;  Location: [place] CV LAB;  Service: Cardiovascular;  Laterality: N/A;  . INGUINAL HERNIA REPAIR Right 2000's X 2  . LAPAROSCOPIC RIGHT HEMI COLECTOMY N/A 11/04/2012   Procedure: LAPAROSCOPIC RIGHT HEMI COLECTOMY;  Surgeon: Pedro Earls, MD;  Location: [facility];  Service: General;  Laterality: N/A;  . LEFT HEART CATH AND CORS/GRAFTS ANGIOGRAPHY Left 11/22/2018   Procedure: LEFT HEART CATH AND CORS/GRAFTS ANGIOGRAPHY;  Surgeon: Nelva Bush, MD;  Location: [place] CV LAB;  Service: Cardiovascular;  Laterality: Left;  . TONSILLECTOMY  ~ 1950    Current Medications: Current Meds  Medication Sig  . acetaminophen (TYLENOL) 325 MG tablet Take 1-2 tablets (325-650 mg total) by mouth every 4 (four) hours as needed for mild pain.  Marland Kitchen atorvastatin (LIPITOR) 80 MG tablet TAKE 1 TABLET BY MOUTH  DAILY  . carvedilol (COREG) 6.25 MG tablet TAKE 1 TABLET BY MOUTH  TWICE DAILY WITH A MEAL  .  clopidogrel (PLAVIX) 75 MG tablet TAKE 1 TABLET BY MOUTH  DAILY  . ezetimibe (ZETIA) 10 MG tablet TAKE 1 TABLET BY MOUTH  DAILY  . furosemide (LASIX) 40 MG tablet TAKE 1 TABLET BY MOUTH  DAILY  . isosorbide mononitrate (IMDUR) 30 MG 24 hr tablet Take 1 tablet (30 mg total) by mouth daily.  Marland Kitchen losartan (COZAAR) 50 MG tablet TAKE 1 TABLET BY MOUTH  DAILY  . meclizine (ANTIVERT) 25 MG tablet Take 1 tablet (25 mg total) by mouth daily as needed for dizziness.  . nitroGLYCERIN (NITROSTAT) 0.4 MG SL tablet DISSOLVE 1 TABLET UNDER THE TONGUE EVERY 5 MINUTES AS  NEEDED FOR CHEST PAIN. MAX  OF 3 TABLETS IN 15 MINUTES. CALL 911 IF PAIN PERSISTS.  Marland Kitchen pantoprazole (PROTONIX) 40 MG tablet TAKE 1 TABLET BY MOUTH  DAILY  . polyethylene glycol (MIRALAX / GLYCOLAX) 17 g packet Take 17 g by mouth as needed.  . potassium chloride (KLOR-CON) 10 MEQ tablet Take 1 tablet (10 mEq total) by mouth daily.  Marland Kitchen  rivaroxaban (XARELTO) 20 MG TABS tablet Take 1 tablet (20 mg total) by mouth daily with supper.  . vitamin B-12 (CYANOCOBALAMIN) 100 MCG tablet Take 100 mcg by mouth daily.      Allergies:   Buspar [buspirone], Latex, and Tape   Social History   Socioeconomic History  . Marital status: Widowed    Spouse name: Not on file  . Number of children: 3  . Years of education: Not on file  . Highest education level: Not on file  Occupational History  . Occupation: self Physiological scientist  Tobacco Use  . Smoking status: Never Smoker  . Smokeless tobacco: Never Used  Vaping Use  . Vaping Use: Never used  Substance and Sexual Activity  . Alcohol use: Yes    Alcohol/week: 2.0 standard drinks    Types: 2 Cans of beer per week  . Drug use: No  . Sexual activity: Not Currently  Other Topics Concern  . Not on file  Social History Narrative   Full time. Married.    Social Determinants of Health   Financial Resource Strain: Low Risk   . Difficulty of Paying Living Expenses: Not hard at all  Food Insecurity: No Food  Insecurity  . Worried About [name] fundraiser in the Last Year: Never true  . Ran Out of Food in the Last Year: Never true  Transportation Needs: No Transportation Needs  . Lack of Transportation (Medical): No  . Lack of Transportation (Non-Medical): No  Physical Activity: Insufficiently Active  . Days of Exercise per Week: 2 days  . Minutes of Exercise per Session: 60 min  Stress: No Stress Concern Present  . Feeling of Stress : Not at all  Social Connections: Not on file     Family History: The patient's family history includes Alzheimer's disease in his mother; Heart disease in his father; Hyperlipidemia in his father and sister; Hypertension in his father and sister; Prostate cancer in his father. There is no history of Colon cancer, Esophageal cancer, Rectal cancer, or Stomach cancer.  ROS:   Please see the history of present illness.     All other systems reviewed and are negative.  EKGs/Labs/Other Studies Reviewed:    The following studies were reviewed today:   Cardiac catheterization 11/22/2018: Conclusions: 1. Multivessel coronary artery disease, as outlined below. Culprit lesion for the patient's unstable angina is most likely the anastomotic lesion at the SVG to rPDA. Otherwise, coronary and graft disease appears stable from prior catheterization in 2016. 2. Patent mid RCA stent with mild focal in-stent restenosis. 3. Widely patent LIMA to LAD. 4. Widely patent sequential SVG to ramus intermedius, OM2, and OM3. 5. Patent sequential SVG to RPDA with 95% focal stenosis at the side to side anastomosis of graft to rPDA. Jump portion to rPL remains chronically occluded. 6. Moderately elevated left ventricular filling pressure. 7. Successful PCI to SVG to RPDA using Resolute Onyx 2.75 x 26 mm drug-eluting stent with 0% residual stenosis and TIMI-3 flow.   EKG:  EKG is  ordered today.  The ekg ordered today demonstrates atrial fibrillation with occasional ventricular  pacing, PVCs, heart rate 66 bpm  Recent Labs: 05/23/2020: Magnesium 2.0 07/05/2020: Hemoglobin 11.9; Platelets 160.0 07/16/2020: ALT 16; BUN 14; Creatinine, Ser 1.05; Potassium 3.6; Sodium 139  Recent Lipid Panel    Component Value Date/Time   CHOL 121 06/07/2019 0540   CHOL 182 11/16/2018 0900   TRIG 112 06/07/2019 0540   HDL 39 (L) 06/07/2019 0540  HDL 56 11/16/2018 0900   CHOLHDL 3.1 06/07/2019 0540   VLDL 22 06/07/2019 0540   LDLCALC 60 06/07/2019 0540   LDLCALC 102 (H) 11/16/2018 0900   LDLDIRECT 104 (H) 03/28/2019 1018     Risk Assessment/Calculations:       Physical Exam:    VS:  BP 116/70 (BP Location: Left Arm, Patient Position: Sitting, Cuff Size: Normal)   Pulse 66   Ht 6' (1.829 m)   Wt 128 lb (58.1 kg)   SpO2 98%   BMI 17.36 kg/m     Wt Readings from Last 3 Encounters:  07/30/20 128 lb (58.1 kg)  07/05/20 123 lb (55.8 kg)  05/23/20 126 lb 3.2 oz (57.2 kg)     GEN: Thin,  in no acute distress HEENT: Normal NECK: No JVD; No carotid bruits LYMPHATICS: No lymphadenopathy CARDIAC: irreg, no murmurs, rubs, gallops RESPIRATORY:  Clear to auscultation without rales, wheezing or rhonchi  ABDOMEN: Soft, non-tender, non-distended MUSCULOSKELETAL:  No edema; No deformity  SKIN: Warm and dry NEUROLOGIC:  Alert and oriented x 3 PSYCHIATRIC:  Normal affect   ASSESSMENT:    1. Chronic systolic heart failure (HCC)   2. Persistent atrial fibrillation (HCC)    PLAN:    In order of problems listed above:  Coronary artery disease status post CABG - Cardiac catheterization reviewed as above prior PCI to SVG to PDA.  No active anginal symptoms.  Chronic systolic heart failure with ischemic cardiomyopathy - Tried spironolactone but this caused constipation.  Blood pressure too soft for utilization of Entresto.  Margarita Grizzle has been monitoring ICD.  Adjusting Lasix if needed.  Taking 40 mg a day.  He is also on isosorbide 30 mg carvedilol 6.25 twice a day and losartan  50 mg a day.  Creatinine 1.05 hemoglobin 11.9 ALT 16 potassium 3.6.  Persistent atrial fibrillation - Continuing Xarelto no bleeding.  Frequent PVCs - Noted on EKG.  Underlying atrial fibrillation.  Continue to encourage protein  6 mth follow up   Medication Adjustments/Labs and Tests Ordered: Current medicines are reviewed at length with the patient today.  Concerns regarding medicines are outlined above.  Orders Placed This Encounter  Procedures  . EKG 12-Lead   No orders of the defined types were placed in this encounter.   Patient Instructions  Medication Instructions:  The current medical regimen is effective;  continue present plan and medications.  *If you need a refill on your cardiac medications before your next appointment, please call your pharmacy*  Follow-Up: At [facility], you and your health needs are our priority.  As part of our continuing mission to provide you with exceptional heart care, we have created designated Provider Care Teams.  These Care Teams include your primary Cardiologist (physician) and Advanced Practice Providers (APPs -  Physician Assistants and Nurse Practitioners) who all work together to provide you with the care you need, when you need it.  We recommend signing up for the patient portal called "MyChart".  Sign up information is provided on this After Visit Summary.  MyChart is used to connect with patients for Virtual Visits (Telemedicine).  Patients are able to view lab/test results, encounter notes, upcoming appointments, etc.  Non-urgent messages can be sent to your provider as well.   To learn more about what you can do with MyChart, go to [email or web address].    Your next appointment:   6 month(s)  The format for your next appointment:   In Person  Provider:   Elta Guadeloupe  Marlou Porch, MD   Thank you for choosing [facility]!!         Signed, Candee Furbish, MD  07/30/2020 10:06 AM    [place]

## 2020-07-30 NOTE — Patient Instructions (Signed)

## 2020-07-31 ENCOUNTER — Ambulatory Visit (INDEPENDENT_AMBULATORY_CARE_PROVIDER_SITE_OTHER): Payer: Medicare Other | Admitting: Ophthalmology

## 2020-07-31 ENCOUNTER — Encounter (INDEPENDENT_AMBULATORY_CARE_PROVIDER_SITE_OTHER): Payer: Self-pay | Admitting: Ophthalmology

## 2020-07-31 DIAGNOSIS — H353222 Exudative age-related macular degeneration, left eye, with inactive choroidal neovascularization: Secondary | ICD-10-CM | POA: Diagnosis not present

## 2020-07-31 DIAGNOSIS — H353211 Exudative age-related macular degeneration, right eye, with active choroidal neovascularization: Secondary | ICD-10-CM | POA: Diagnosis not present

## 2020-07-31 MED ORDER — AFLIBERCEPT 2MG/0.05ML IZ SOLN FOR KALEIDOSCOPE
2.0000 mg | INTRAVITREAL | Status: AC | PRN
  Administered 2020-07-31: 2 mg via INTRAVITREAL

## 2020-07-31 NOTE — Progress Notes (Signed)
07/31/2020     CHIEF COMPLAINT Patient presents for Retina Follow Up (3 Mo F/U OU, poss Eylea OD//Pt denies noticeable changes to [place] OU since last visit. Pt denies ocular pain, flashes of light, or floaters OU. Pt reports stable blur at near with small print OU.//)   HISTORY OF PRESENT ILLNESS: Mark Clayton is a 80 y.o. male who presents to the clinic today for:   HPI    Retina Follow Up    Diagnosis: Wet AMD   Laterality: right eye   Onset: 3 months ago   Severity: severe   Duration: 3 months   Course: stable   Comments: 3 Mo F/U OU, poss Eylea OD  Pt denies noticeable changes to [place] OU since last visit. Pt denies ocular pain, flashes of light, or floaters OU. Pt reports stable blur at near with small print OU.         Last edited by Rockie Neighbours, [place] on 07/31/2020 11:04 AM. (History)      Referring physician: [name], MD [place],  [place] [id number]  HISTORICAL INFORMATION:   Selected notes from the MEDICAL RECORD NUMBER    Lab Results  Component Value Date   HGBA1C 6.1 (H) 06/07/2019     CURRENT MEDICATIONS: No current outpatient medications on file. (Ophthalmic Drugs)   No current facility-administered medications for this visit. (Ophthalmic Drugs)   Current Outpatient Medications (Other)  Medication Sig  . acetaminophen (TYLENOL) 325 MG tablet Take 1-2 tablets (325-650 mg total) by mouth every 4 (four) hours as needed for mild pain.  Marland Kitchen atorvastatin (LIPITOR) 80 MG tablet TAKE 1 TABLET BY MOUTH  DAILY  . carvedilol (COREG) 6.25 MG tablet TAKE 1 TABLET BY MOUTH  TWICE DAILY WITH A MEAL  . clopidogrel (PLAVIX) 75 MG tablet TAKE 1 TABLET BY MOUTH  DAILY  . ezetimibe (ZETIA) 10 MG tablet TAKE 1 TABLET BY MOUTH  DAILY  . furosemide (LASIX) 40 MG tablet TAKE 1 TABLET BY MOUTH  DAILY  . isosorbide mononitrate (IMDUR) 30 MG 24 hr tablet Take 1 tablet (30 mg total) by mouth daily.  Marland Kitchen losartan (COZAAR) 50 MG tablet TAKE 1 TABLET BY  MOUTH  DAILY  . meclizine (ANTIVERT) 25 MG tablet Take 1 tablet (25 mg total) by mouth daily as needed for dizziness.  . nitroGLYCERIN (NITROSTAT) 0.4 MG SL tablet DISSOLVE 1 TABLET UNDER THE TONGUE EVERY 5 MINUTES AS  NEEDED FOR CHEST PAIN. MAX  OF 3 TABLETS IN 15 MINUTES. CALL 911 IF PAIN PERSISTS.  Marland Kitchen pantoprazole (PROTONIX) 40 MG tablet TAKE 1 TABLET BY MOUTH  DAILY  . polyethylene glycol (MIRALAX / GLYCOLAX) 17 g packet Take 17 g by mouth as needed.  . potassium chloride (KLOR-CON) 10 MEQ tablet Take 1 tablet (10 mEq total) by mouth daily.  . rivaroxaban (XARELTO) 20 MG TABS tablet Take 1 tablet (20 mg total) by mouth daily with supper.  . vitamin B-12 (CYANOCOBALAMIN) 100 MCG tablet Take 100 mcg by mouth daily.    No current facility-administered medications for this visit. (Other)      REVIEW OF SYSTEMS:    ALLERGIES Allergies  Allergen Reactions  . Buspar [Buspirone] Other (See Comments)    Fatigue, body aches and shortness of breath   . Latex Rash  . Tape Rash and Other (See Comments)    USE PAPER    PAST MEDICAL HISTORY Past Medical History:  Diagnosis Date  . AICD (automatic cardioverter/defibrillator) present  01/17/2003   Medtronic Maximo 7232CX ICD, serial #[id number]  . Anemia 02-06-11   takes oral iron  . Arthritis    hands, knees  . CAD (coronary artery disease) 2003   a. h/o MI and CABG in 2003. b. s/p DES to SVG-RPDA-RPLB in 08/2014.  Marland Kitchen Cancer of sigmoid colon ([place]) 2012   a. s/p colon surgery.  . Carotid bruit   . Chronic systolic CHF (congestive heart failure) (HCC)    a. EF 20% in 2014; b. 08/2017 Echo: EF 20-25%, diff HK, Gr3 DD. Triv AI. Mod MR. Sev dil LA. Mildly dil RV w/ mildly reduced RV fxn. Mildly dil RA. Mod TR. PASP 52mHg.  Marland Kitchen Cough   . Exudative age-related macular degeneration of left eye with active choroidal neovascularization ([place]) 07/12/2019  . Exudative age-related macular degeneration, right eye, with inactive choroidal neovascularization  ([place]) 07/12/2019  . GERD (gastroesophageal reflux disease) 02-06-11  . HTN (hypertension)   . Hyperlipidemia   . Ischemic cardiomyopathy    a. EF 20% in 2014. (Master study EF >20%); b. 08/2017 Echo: EF 20-25%, diff HK. Gr3 DD.  . LV (left ventricular) mural thrombus    a. 12/2012 Echo: EF 20% with mural thrombus No evidence of thrombus on 08/2017 echo.  . Macular degeneration   . Myocardial infarct ([place])    2003  . PAF (paroxysmal atrial fibrillation) (HCC)    a. CHA2DS2VASc = 5-->Xarelto/Tikosyn.   Past Surgical History:  Procedure Laterality Date  . CARDIAC CATHETERIZATION N/A 09/20/2014   Procedure: Left Heart Cath and Coronary Angiography;  Surgeon: Jettie Booze, MD; LAD 95%, D1 100%, CFX liner percent, OM 200%, OM 390%, RCA 90%, LIMA-LAD okay, SVG-OM 2-OM 3 minimal disease, SVG-RPDA-RPLB 100% between the RPDA and RPL     . CARDIAC CATHETERIZATION N/A 09/20/2014   Procedure: Coronary Stent Intervention;  Surgeon: Jettie Booze, MD; Synergy DES 4 x 24 mm reducing the stenosis to 5%   . CARDIAC DEFIBRILLATOR PLACEMENT  01/17/03   6949 lead. Medtronic. remote-no; with later revision  . CARDIOVERSION N/A 05/22/2016   Procedure: CARDIOVERSION;  Surgeon: Lelon Perla, MD;  Location: [facility] ENDOSCOPY;  Service: Cardiovascular;  Laterality: N/A;  . CARDIOVERSION N/A 08/10/2017   Procedure: CARDIOVERSION;  Surgeon: Pixie Casino, MD;  Location: [facility] ENDOSCOPY;  Service: Cardiovascular;  Laterality: N/A;  . CATARACT EXTRACTION W/ INTRAOCULAR LENS  IMPLANT, BILATERAL Bilateral June/-July 2009   Dr. Katy Fitch  . CATARACT EXTRACTION W/PHACO Bilateral 2010   Dr. Katy Fitch  . COLON RESECTION  02/09/2011   Procedure: LAPAROSCOPIC SIGMOID COLON RESECTION;  Surgeon: Pedro Earls, MD;  Location: [facility] ORS;  Service: General;  Laterality: N/A;  Laparoscopic Assisted Sigmoid Colectomy  . COLON SURGERY    . COLONOSCOPY  08/31/2011   Procedure: COLONOSCOPY;  Surgeon: Jerene Bears, MD;  Location: [facility]  ENDOSCOPY;  Service: Gastroenterology;  Laterality: N/A;  . COLONOSCOPY N/A 09/05/2012   Procedure: COLONOSCOPY;  Surgeon: Jerene Bears, MD;  Location: [facility] ENDOSCOPY;  Service: Gastroenterology;  Laterality: N/A;  . COLONOSCOPY N/A 04/18/2013   Procedure: COLONOSCOPY;  Surgeon: Jerene Bears, MD;  Location: [facility] ENDOSCOPY;  Service: Gastroenterology;  Laterality: N/A;  . COLONOSCOPY N/A 04/09/2014   Procedure: COLONOSCOPY;  Surgeon: Jerene Bears, MD;  Location: [facility] ENDOSCOPY;  Service: Gastroenterology;  Laterality: N/A;  . COLONOSCOPY WITH PROPOFOL N/A 05/03/2017   Procedure: COLONOSCOPY WITH PROPOFOL;  Surgeon: Jerene Bears, MD;  Location: [facility] ENDOSCOPY;  Service: Gastroenterology;  Laterality: N/A;  .  CORONARY ANGIOPLASTY    . CORONARY ARTERY BYPASS GRAFT  01/2002   LIMA-LAD, SVG-OM 2-OM 3, SVG-RPDA-RPLB  . CORONARY STENT INTERVENTION N/A 11/22/2018   Procedure: CORONARY STENT INTERVENTION;  Surgeon: Nelva Bush, MD;  Location: [facility] CV LAB;  Service: Cardiovascular;  Laterality: N/A;  SVG - RCA  . ICD GENERATOR CHANGE  2010   Medtronic Virtuoso II VR ICD  . ICD GENERATOR CHANGEOUT N/A 12/07/2018   Procedure: ICD GENERATOR CHANGEOUT;  Surgeon: Deboraha Sprang, MD;  Location: [facility];  Service: Cardiovascular;  Laterality: N/A;  . INGUINAL HERNIA REPAIR Right 2000's X 2  . LAPAROSCOPIC RIGHT HEMI COLECTOMY N/A 11/04/2012   Procedure: LAPAROSCOPIC RIGHT HEMI COLECTOMY;  Surgeon: Pedro Earls, MD;  Location: [facility] ORS;  Service: General;  Laterality: N/A;  . LEFT HEART CATH AND CORS/GRAFTS ANGIOGRAPHY Left 11/22/2018   Procedure: LEFT HEART CATH AND CORS/GRAFTS ANGIOGRAPHY;  Surgeon: Nelva Bush, MD;  Location: [facility];  Service: Cardiovascular;  Laterality: Left;  . TONSILLECTOMY  ~ 1950    FAMILY HISTORY Family History  Problem Relation Age of Onset  . Hypertension Father   . Hyperlipidemia Father   . Heart disease Father   . Prostate cancer Father   .  Alzheimer's disease Mother   . Hypertension Sister   . Hyperlipidemia Sister   . Colon cancer Neg Hx   . Esophageal cancer Neg Hx   . Rectal cancer Neg Hx   . Stomach cancer Neg Hx     SOCIAL HISTORY Social History   Tobacco Use  . Smoking status: Never Smoker  . Smokeless tobacco: Never Used  Vaping Use  . Vaping Use: Never used  Substance Use Topics  . Alcohol use: Yes    Alcohol/week: 2.0 standard drinks    Types: 2 Cans of beer per week  . Drug use: No         OPHTHALMIC EXAM: Base Eye Exam    Visual Acuity (ETDRS)      Right Left   Dist [place] 20/100 +2 CF at 3'   Dist ph [place] 20/15 NI       Tonometry (Tonopen, 11:08 AM)      Right Left   Pressure 09 10       Pupils      Pupils Dark Light Shape React APD   Right PERRL 3 3 Round Minimal None   Left PERRL 3 3 Round Minimal None       Visual Fields (Counting fingers)      Left Right    Full Full       Extraocular Movement      Right Left    Full Full       Neuro/Psych    Oriented x3: Yes   Mood/Affect: Normal       Dilation    Both eyes: 1.0% Mydriacyl, 2.5% Phenylephrine @ 11:08 AM        Slit Lamp and Fundus Exam    External Exam      Right Left   External Normal Normal       Slit Lamp Exam      Right Left   Lids/Lashes Normal Normal   Conjunctiva/Sclera White and quiet White and quiet   Cornea Clear Clear   Anterior Chamber Deep and quiet Deep and quiet   Iris Round and reactive Round and reactive   Lens Posterior chamber intraocular lens Posterior chamber intraocular lens   Anterior Vitreous Normal  Normal       Fundus Exam      Right Left   Posterior Vitreous Normal Normal   Disc Normal Normal   C/D Ratio 0.25 0.25   Macula Hard drusen, Advanced age related macular degeneration, Retinal pigment epithelial atrophy, Macular thickening, Subretinal neovascular membrane fibrotic Disciform scar no active hemorrhage   Vessels Normal Normal   Periphery Normal Normal           IMAGING AND PROCEDURES  Imaging and Procedures for 07/31/20  OCT, Retina - OU - Both Eyes       Right Eye Quality was good. Scan locations included subfoveal. Central Foveal Thickness: 346. Progression has been stable. Findings include abnormal foveal contour, disciform scar, cystoid macular edema.   Left Eye Quality was good. Scan locations included subfoveal. Central Foveal Thickness: 420. Progression has worsened. Findings include abnormal foveal contour.   Notes Disciform macular scar right eye centrally, with edges less active with less intraretinal fluid and CME.  Will continue with therapy today repeat Eylea OD and extend interval examination to 12 weeks to monitor and prevent scotoma enlargement with treatment as necessary         Intravitreal Injection, Pharmacologic Agent - OD - Right Eye       Time Out 07/31/2020. 11:55 AM. Confirmed correct patient, procedure, site, and patient consented.   Anesthesia Topical anesthesia was used. Anesthetic medications included Akten 3.5%.   Procedure Preparation included Ofloxacin , Tobramycin 0.3%, 10% betadine to eyelids, 5% betadine to ocular surface. A 30 gauge needle was used.   Injection:  2 mg aflibercept Alfonse Flavors) SOLN   NDC: [id number], Lot: [id number]   Route: Intravitreal, Site: Right Eye, Waste: 0 mg  Post-op Post injection exam found visual acuity of at least counting fingers. The patient tolerated the procedure well. There were no complications. The patient received written and verbal post procedure care education. Post injection medications were not given.                 ASSESSMENT/PLAN:  Exudative age-related macular degeneration of left eye with inactive choroidal neovascularization (HCC) No signs of active disease by clinical examination or OCT OS  Exudative age-related macular degeneration of right eye with active choroidal neovascularization (HCC) CNVM, much less active centrally with  perifoveal activity controlled at 73-month follow-up intravitreal Eylea      ICD-10-CM   1. Exudative age-related macular degeneration of right eye with active choroidal neovascularization (HCC)  H35.3211 OCT, Retina - OU - Both Eyes    Intravitreal Injection, Pharmacologic Agent - OD - Right Eye    aflibercept (EYLEA) SOLN 2 mg  2. Exudative age-related macular degeneration of left eye with inactive choroidal neovascularization ([place])  H35.3222     1.  OD, with stable macular scar and edge of CNVM controlled at 65-month interval today Eylea will repeat injection today and examination again in 3 months dilate OU  2.  3.  Ophthalmic Meds Ordered this visit:  Meds ordered this encounter  Medications  . aflibercept (EYLEA) SOLN 2 mg       Return in about 3 months (around 10/31/2020) for DILATE OU, EYLEA OCT, OD.  There are no Patient Instructions on file for this visit.   Explained the diagnoses, plan, and follow up with the patient and they expressed understanding.  Patient expressed understanding of the importance of proper follow up care.   Clent Demark Saory Carriero M.D. Diseases & Surgery of the Retina and [facility] 07/31/20     Abbreviations: M myopia (nearsighted); A astigmatism; H hyperopia (farsighted); P presbyopia; Mrx spectacle prescription;  CTL contact lenses; OD right eye; OS left eye; OU both eyes  XT exotropia; ET esotropia; PEK punctate epithelial keratitis; PEE punctate epithelial erosions; DES dry eye syndrome; MGD meibomian gland dysfunction; ATs artificial tears; PFAT's preservative free artificial tears; [place] nuclear sclerotic cataract; PSC posterior subcapsular cataract; ERM epi-retinal membrane; PVD posterior vitreous detachment; RD retinal detachment; DM diabetes mellitus; DR diabetic retinopathy; NPDR non-proliferative diabetic retinopathy; PDR proliferative diabetic retinopathy; CSME clinically significant macular edema; DME diabetic macular  edema; dbh dot blot hemorrhages; CWS cotton wool spot; POAG primary open angle glaucoma; C/D cup-to-disc ratio; HVF humphrey visual field; GVF goldmann visual field; OCT optical coherence tomography; IOP intraocular pressure; BRVO Branch retinal vein occlusion; CRVO central retinal vein occlusion; CRAO central retinal artery occlusion; BRAO branch retinal artery occlusion; RT retinal tear; SB scleral buckle; PPV pars plana vitrectomy; VH Vitreous hemorrhage; PRP panretinal laser photocoagulation; IVK intravitreal kenalog; VMT vitreomacular traction; MH Macular hole;  NVD neovascularization of the disc; NVE neovascularization elsewhere; AREDS age related eye disease study; ARMD age related macular degeneration; POAG primary open angle glaucoma; EBMD epithelial/anterior basement membrane dystrophy; ACIOL anterior chamber intraocular lens; IOL intraocular lens; PCIOL posterior chamber intraocular lens; Phaco/IOL phacoemulsification with intraocular lens placement; [place] photorefractive keratectomy; LASIK laser assisted in situ keratomileusis; HTN hypertension; DM diabetes mellitus; COPD chronic obstructive pulmonary disease

## 2020-07-31 NOTE — Assessment & Plan Note (Signed)
No signs of active disease by clinical examination or OCT OS

## 2020-07-31 NOTE — Assessment & Plan Note (Signed)
CNVM, much less active centrally with perifoveal activity controlled at 78-month follow-up intravitreal Eylea

## 2020-08-02 DIAGNOSIS — Z7902 Long term (current) use of antithrombotics/antiplatelets: Secondary | ICD-10-CM | POA: Diagnosis not present

## 2020-08-02 DIAGNOSIS — Z950 Presence of cardiac pacemaker: Secondary | ICD-10-CM | POA: Diagnosis not present

## 2020-08-02 DIAGNOSIS — I5022 Chronic systolic (congestive) heart failure: Secondary | ICD-10-CM | POA: Diagnosis not present

## 2020-08-02 DIAGNOSIS — M21371 Foot drop, right foot: Secondary | ICD-10-CM | POA: Diagnosis not present

## 2020-08-02 DIAGNOSIS — Z7901 Long term (current) use of anticoagulants: Secondary | ICD-10-CM | POA: Diagnosis not present

## 2020-08-02 DIAGNOSIS — I69351 Hemiplegia and hemiparesis following cerebral infarction affecting right dominant side: Secondary | ICD-10-CM | POA: Diagnosis not present

## 2020-08-06 DIAGNOSIS — I5022 Chronic systolic (congestive) heart failure: Secondary | ICD-10-CM | POA: Diagnosis not present

## 2020-08-06 DIAGNOSIS — Z7901 Long term (current) use of anticoagulants: Secondary | ICD-10-CM | POA: Diagnosis not present

## 2020-08-06 DIAGNOSIS — M21371 Foot drop, right foot: Secondary | ICD-10-CM | POA: Diagnosis not present

## 2020-08-06 DIAGNOSIS — I69351 Hemiplegia and hemiparesis following cerebral infarction affecting right dominant side: Secondary | ICD-10-CM | POA: Diagnosis not present

## 2020-08-06 DIAGNOSIS — Z950 Presence of cardiac pacemaker: Secondary | ICD-10-CM | POA: Diagnosis not present

## 2020-08-06 DIAGNOSIS — Z7902 Long term (current) use of antithrombotics/antiplatelets: Secondary | ICD-10-CM | POA: Diagnosis not present

## 2020-08-08 ENCOUNTER — Telehealth: Payer: Self-pay | Admitting: Internal Medicine

## 2020-08-08 DIAGNOSIS — M21371 Foot drop, right foot: Secondary | ICD-10-CM | POA: Diagnosis not present

## 2020-08-08 DIAGNOSIS — Z7901 Long term (current) use of anticoagulants: Secondary | ICD-10-CM | POA: Diagnosis not present

## 2020-08-08 DIAGNOSIS — I5022 Chronic systolic (congestive) heart failure: Secondary | ICD-10-CM | POA: Diagnosis not present

## 2020-08-08 DIAGNOSIS — Z950 Presence of cardiac pacemaker: Secondary | ICD-10-CM | POA: Diagnosis not present

## 2020-08-08 DIAGNOSIS — Z7902 Long term (current) use of antithrombotics/antiplatelets: Secondary | ICD-10-CM | POA: Diagnosis not present

## 2020-08-08 DIAGNOSIS — I69351 Hemiplegia and hemiparesis following cerebral infarction affecting right dominant side: Secondary | ICD-10-CM | POA: Diagnosis not present

## 2020-08-08 NOTE — Telephone Encounter (Signed)
Trip Pickel from [facility] home [facility] has called to report a fall on 5.10.22. Patient is fine, just minor cuts and bruising.  Trip: [phone]

## 2020-08-09 NOTE — Telephone Encounter (Signed)
Patient called to report fall and said that he slightly hit his head. He does have a bruise on his arm and leg. Patient is requesting a call back at [phone]. Please advise.

## 2020-08-09 NOTE — Progress Notes (Signed)
Remote ICD transmission.   

## 2020-08-12 NOTE — Telephone Encounter (Signed)
Spoke to patient about the fall he had on Wed or Fri, patient thought it was Friday. Patient states he has an appointment tomorrow with Dr Sharlet Salina. Patient states after falling (tripped), now his right eye is blurred. Also, he needs medication for anxiety attack which cause him to have breathing problem. When it happens he takes big breaths and it helps. Patient has an appointment 08/13/2020 at 1:40 pm he will let the doctor know.

## 2020-08-13 ENCOUNTER — Ambulatory Visit (INDEPENDENT_AMBULATORY_CARE_PROVIDER_SITE_OTHER): Payer: Medicare Other | Admitting: Internal Medicine

## 2020-08-13 ENCOUNTER — Telehealth: Payer: Self-pay | Admitting: Internal Medicine

## 2020-08-13 ENCOUNTER — Other Ambulatory Visit: Payer: Self-pay

## 2020-08-13 ENCOUNTER — Encounter: Payer: Self-pay | Admitting: Internal Medicine

## 2020-08-13 VITALS — BP 120/62 | HR 70 | Ht 72.0 in | Wt 125.0 lb

## 2020-08-13 DIAGNOSIS — I5022 Chronic systolic (congestive) heart failure: Secondary | ICD-10-CM | POA: Diagnosis not present

## 2020-08-13 DIAGNOSIS — I69351 Hemiplegia and hemiparesis following cerebral infarction affecting right dominant side: Secondary | ICD-10-CM

## 2020-08-13 DIAGNOSIS — F419 Anxiety disorder, unspecified: Secondary | ICD-10-CM | POA: Diagnosis not present

## 2020-08-13 MED ORDER — MIRTAZAPINE 15 MG PO TABS: 15.0000 mg | ORAL_TABLET | Freq: Every day | ORAL | 0 refills | Status: DC

## 2020-08-13 MED ORDER — FUROSEMIDE 20 MG PO TABS: 20.0000 mg | ORAL_TABLET | Freq: Every day | ORAL | 3 refills | Status: DC

## 2020-08-13 MED ORDER — ALPRAZOLAM 0.25 MG PO TABS: 0.2500 mg | ORAL_TABLET | Freq: Two times a day (BID) | ORAL | 0 refills | Status: DC | PRN

## 2020-08-13 NOTE — Telephone Encounter (Signed)
Sent in

## 2020-08-13 NOTE — Patient Instructions (Addendum)
We have sent in remeron to take in the evening to help with anxiety and stress.   This can take 2-4 weeks to kick in.   We have sent in xanax to use if needed for a panic episode that does not stop with deep breathing. We have sent this to the local pharmacy.  We have sent in the lasix 20 mg daily to take instead of 40 mg every other day.

## 2020-08-13 NOTE — Telephone Encounter (Signed)
See below

## 2020-08-13 NOTE — Telephone Encounter (Signed)
Error

## 2020-08-13 NOTE — Progress Notes (Signed)
   Subjective:   Patient ID: Mark Clayton, male    DOB: 05-22-1940, 80 y.o.   MRN: [id number]  HPI  The patient is a 80 YO man coming in for concerns about his heart failure (having a hard time keeping up with lasix every other day 40 mg, would like to go back to 20 mg daily which he was on previously, not sure how it got switched, weight is stable, weighs daily and monitors food/salt intake), and anxiety (having some panic episodes daily and some anxiety daily, a lot of his fear and panic is concern of not being able to breathe, this is impacting his life and he is wanting to try something to help, panic episodes last less than a minute and is using deep breathing to help with these) and prior stroke (working with PT and feels he is making some progress, did have a gap of several weeks at least without PT and felt he slid back in his function).   Review of Systems  Constitutional: Positive for activity change.  HENT: Negative.   Eyes: Negative.   Respiratory: Negative for cough, chest tightness and shortness of breath.   Cardiovascular: Negative for chest pain, palpitations and leg swelling.  Gastrointestinal: Negative for abdominal distention, abdominal pain, constipation, diarrhea, nausea and vomiting.  Musculoskeletal: Positive for arthralgias and gait problem.  Skin: Negative.   Neurological: Positive for weakness.  Psychiatric/Behavioral: Positive for decreased concentration and dysphoric mood. The patient is nervous/anxious.     Objective:  Physical Exam Constitutional:      Appearance: He is well-developed.  HENT:     Head: Normocephalic and atraumatic.  Cardiovascular:     Rate and Rhythm: Normal rate and regular rhythm.  Pulmonary:     Effort: Pulmonary effort is normal. No respiratory distress.     Breath sounds: Normal breath sounds. No wheezing or rales.  Abdominal:     General: Bowel sounds are normal. There is no distension.     Palpations: Abdomen is soft.      Tenderness: There is no abdominal tenderness. There is no rebound.  Musculoskeletal:     Cervical back: Normal range of motion.  Skin:    General: Skin is warm and dry.  Neurological:     Mental Status: He is alert and oriented to person, place, and time. Mental status is at baseline.     Cranial Nerves: Cranial nerve deficit present.     Motor: Weakness present.     Coordination: Coordination abnormal.  Psychiatric:     Comments: Gets anxious talking about the panic episodes he is having     Vitals:   08/13/20 1329  BP: 120/62  Pulse: 70  SpO2: 99%  Weight: 125 lb (56.7 kg)  Height: 6' (1.829 m)    This visit occurred during the SARS-CoV-2 public health emergency.  Safety protocols were in place, including screening questions prior to the visit, additional usage of staff PPE, and extensive cleaning of exam room while observing appropriate contact time as indicated for disinfecting solutions.   Assessment & Plan:

## 2020-08-13 NOTE — Telephone Encounter (Signed)
   Please send order for ALPRAZolam (XANAX) 0.25 MG tablet to [id number] - [place], [place] - [address]

## 2020-08-14 DIAGNOSIS — F419 Anxiety disorder, unspecified: Secondary | ICD-10-CM | POA: Insufficient documentation

## 2020-08-14 NOTE — Assessment & Plan Note (Signed)
He would like to try switch back to 20 mg lasix daily instead of 40 mg every other day. This is reasonable and prescribed. Advised to follow up with Margarita Grizzle who monitors and doses his lasix with any change in weight.

## 2020-08-14 NOTE — Assessment & Plan Note (Signed)
Overall stable, making some progress with PT. A lot of muscle wasting and weakness.

## 2020-08-14 NOTE — Assessment & Plan Note (Signed)
Rx remeron to help with overall stress and anxiety and sleep. Rx small supply xanax to take for prolonged panic episode if needed only. Follow up in 1-2 months or sooner if needed.

## 2020-08-15 ENCOUNTER — Telehealth: Payer: Self-pay | Admitting: Internal Medicine

## 2020-08-15 DIAGNOSIS — Z950 Presence of cardiac pacemaker: Secondary | ICD-10-CM | POA: Diagnosis not present

## 2020-08-15 DIAGNOSIS — I5022 Chronic systolic (congestive) heart failure: Secondary | ICD-10-CM | POA: Diagnosis not present

## 2020-08-15 DIAGNOSIS — Z7901 Long term (current) use of anticoagulants: Secondary | ICD-10-CM | POA: Diagnosis not present

## 2020-08-15 DIAGNOSIS — M21371 Foot drop, right foot: Secondary | ICD-10-CM | POA: Diagnosis not present

## 2020-08-15 DIAGNOSIS — Z7902 Long term (current) use of antithrombotics/antiplatelets: Secondary | ICD-10-CM | POA: Diagnosis not present

## 2020-08-15 DIAGNOSIS — I69351 Hemiplegia and hemiparesis following cerebral infarction affecting right dominant side: Secondary | ICD-10-CM | POA: Diagnosis not present

## 2020-08-15 NOTE — Telephone Encounter (Signed)
Seeking advice   Patient seeking advice for sore let arm. Patient last seen 5/17.   Please call

## 2020-08-15 NOTE — Telephone Encounter (Signed)
Spoke with the patient and he stated his left shoulder is bothering him. He stated his therapist told him he should get his shoulder looked. Pain scale 9/10 at present. It starts at his shoulder blade radiates down to his wrist. He is not sure if he mentioned this during his visit on the 5/17. Please advise

## 2020-08-16 ENCOUNTER — Other Ambulatory Visit: Payer: Self-pay

## 2020-08-16 ENCOUNTER — Ambulatory Visit (INDEPENDENT_AMBULATORY_CARE_PROVIDER_SITE_OTHER): Payer: Medicare Other

## 2020-08-16 ENCOUNTER — Ambulatory Visit: Payer: Self-pay

## 2020-08-16 ENCOUNTER — Ambulatory Visit: Payer: Medicare Other | Admitting: Family Medicine

## 2020-08-16 ENCOUNTER — Encounter: Payer: Self-pay | Admitting: Family Medicine

## 2020-08-16 VITALS — BP 100/72 | HR 76 | Ht 72.0 in | Wt 127.8 lb

## 2020-08-16 DIAGNOSIS — M5412 Radiculopathy, cervical region: Secondary | ICD-10-CM | POA: Diagnosis not present

## 2020-08-16 DIAGNOSIS — M25512 Pain in left shoulder: Secondary | ICD-10-CM

## 2020-08-16 DIAGNOSIS — M50321 Other cervical disc degeneration at C4-C5 level: Secondary | ICD-10-CM | POA: Diagnosis not present

## 2020-08-16 DIAGNOSIS — M50322 Other cervical disc degeneration at C5-C6 level: Secondary | ICD-10-CM | POA: Diagnosis not present

## 2020-08-16 DIAGNOSIS — M50323 Other cervical disc degeneration at C6-C7 level: Secondary | ICD-10-CM | POA: Diagnosis not present

## 2020-08-16 DIAGNOSIS — M542 Cervicalgia: Secondary | ICD-10-CM | POA: Diagnosis not present

## 2020-08-16 DIAGNOSIS — M47812 Spondylosis without myelopathy or radiculopathy, cervical region: Secondary | ICD-10-CM | POA: Diagnosis not present

## 2020-08-16 NOTE — Progress Notes (Signed)
I, Wendy Poet, LAT, ATC, am serving as scribe for Dr. Lynne Leader.  Subjective:    CC: Left shoulder pain  HPI: Pt is a 80 y/o male c/o L shoulder pain ongoing since/ . MOI: Pt has a hx of a L cerebral stroke (06/06/19) which causes him R-sided weakness, abnormal gait and balance issues. Pt's PCP order home health PT on 07/16/20 to work on treating these issues. Pt suffered a fall last week, landing on his R side and then rolled to his L side. Pt locates pain to his L scapula and into his L post shoulder radiating to his L forearm/wrist.  Radiating pain: yes from L scapula to his L wrist Aggravating factors: L shoulder/arm AROM Treatments tried: Tylenol; menthol topical rub  Pertinent review of Systems: No fevers or chills  Relevant historical information: Poor health.  Heart failure, hemiplegia due to cerebral infarct   Objective:    Vitals:   08/16/20 1049  BP: 100/72  Pulse: 76  SpO2: 97%   General: Well Developed, well nourished, and in no acute distress.   MSK: C-spine normal-appearing nontender midline.  Tender palpation left cervical paraspinal musculature and trapezius. Normal cervical motion some pain left cervical paraspinal muscle with lateral flexion. Upon the left side Spurling's test. Upper extremity strength left side is intact.  Excellent reflexes are intact left side. Sensation is intact left side upper extremities.   Lab and Radiology Results  X-ray images C-spine and left shoulder obtained today personally and independently interpreted. C-spine: Degenerative changes throughout without acute fractures visible.  Left shoulder: No acute fractures.  Mild degenerative changes.  Pacemaker present left chest.  Await formal radiology review    Impression and Recommendations:    Assessment and Plan: 80 y.o. male with left arm pain after fall on the right side occurring about a week ago.  Patient has symptoms that are somewhat concerning for cervical  radiculopathy.  However this seem to be quite mild.  He does not have new left arm weakness.  Additionally he did not fall on his left side.  Discussed options.  Certainly could try prednisone and gabapentin is what I would normally would for cervical radiculopathy however after discussion with patient he would like to avoid potential medication side effects (I think this is a wise decision).  Will treat conservatively with home health physical therapy.  He already has home health engaged so we will add on to home health physical therapy services for cervical radiculopathy and recheck in 1 month or sooner if needed.  Discussed precautions.  Await radiology overread.Marland Kitchen  PDMP not reviewed this encounter. Orders Placed This Encounter  Procedures  . [place] LIMITED JOINT SPACE STRUCTURES UP LEFT(NO LINKED CHARGES)    Order Specific Question:   Reason for Exam (SYMPTOM  OR DIAGNOSIS REQUIRED)    Answer:   L shoulder pain    Order Specific Question:   Preferred imaging location?    Answer:   [place]  . DG Cervical Spine 2 or 3 views    Standing Status:   Future    Number of Occurrences:   1    Standing Expiration Date:   09/16/2020    Order Specific Question:   Reason for Exam (SYMPTOM  OR DIAGNOSIS REQUIRED)    Answer:   Neck pain    Order Specific Question:   Preferred imaging location?    Answer:   [facility]  . DG Shoulder Left    Standing  Status:   Future    Number of Occurrences:   1    Standing Expiration Date:   09/16/2020    Order Specific Question:   Reason for Exam (SYMPTOM  OR DIAGNOSIS REQUIRED)    Answer:   L shoulder pain    Order Specific Question:   Preferred imaging location?    Answer:   [facility]  . Ambulatory referral to Home Health    Referral Priority:   Routine    Referral Type:   Home Health Care    Referral Reason:   Specialty Services Required    Requested Specialty:   [place]    Number of Visits Requested:    1   No orders of the defined types were placed in this encounter.   Discussed warning signs or symptoms. Please see discharge instructions. Patient expresses understanding.   The above documentation has been reviewed and is accurate and complete Lynne Leader, M.D.

## 2020-08-16 NOTE — Telephone Encounter (Signed)
If injury to shoulder can schedule with sports medicine. If no injury can try tylenol and ice/heat and if still present in 5 days let us know.

## 2020-08-16 NOTE — Telephone Encounter (Signed)
LVM asking the patient to give our office a call back. Office number was provided.

## 2020-08-16 NOTE — Patient Instructions (Signed)
Thank you for coming in today.  Plan to add on home health physical therapy.   I can call in medicines if needed but I agree fewer medicines is better.   Recheck with me in 1 month or sooner if needed for this.   Let me know if you are getting worse.    Cervical Radiculopathy  Cervical radiculopathy means that a nerve in the neck (a cervical nerve) is pinched or bruised. This can happen because of an injury to the cervical spine (vertebrae) in the neck, or as a normal part of getting older. This can cause pain or loss of feeling (numbness) that runs from your neck all the way down to your arm and fingers. Often, this condition gets better with rest. Treatment may be needed if the condition does not get better. What are the causes?  A neck injury.  A bulging disk in your spine.  Muscle movements that you cannot control (muscle spasms).  Tight muscles in your neck due to overuse.  Arthritis.  Breakdown in the bones and joints of the spine (spondylosis) due to getting older.  Bone spurs that form near the nerves in the neck. What are the signs or symptoms?  Pain. The pain may: ? Run from the neck to the arm and hand. ? Be very bad or irritating. ? Be worse when you move your neck.  Loss of feeling or tingling in your arm or hand.  Weakness in your arm or hand, in very bad cases. How is this treated? In many cases, treatment is not needed for this condition. With rest, the condition often gets better over time. If treatment is needed, options may include:  Wearing a soft neck collar (cervical collar) for short periods of time, as told by your doctor.  Doing exercises (physical therapy) to strengthen your neck muscles.  Taking medicines.  Having shots (injections) in your spine, in very bad cases.  Having surgery. This may be needed if other treatments do not help. The type of surgery that is used depends on the cause of your condition. Follow these instructions at  home: If you have a soft neck collar:  Wear it as told by your doctor. Remove it only as told by your doctor.  Ask your doctor if you can remove the collar for cleaning and bathing. If you are allowed to remove the collar for cleaning or bathing: ? Follow instructions from your doctor about how to remove the collar safely. ? Clean the collar by wiping it with mild soap and water and drying it completely. ? Take out any removable pads in the collar every 1-2 days. Wash them by hand with soap and water. Let them air-dry completely before you put them back in the collar. ? Check your skin under the collar for redness or sores. If you see any, tell your doctor. Managing pain  Take over-the-counter and prescription medicines only as told by your doctor.  If told, put ice on the painful area. ? If you have a soft neck collar, remove it as told by your doctor. ? Put ice in a plastic bag. ? Place a towel between your skin and the bag. ? Leave the ice on for 20 minutes, 2-3 times a day.  If using ice does not help, you can try using heat. Use the heat source that your doctor recommends, such as a moist heat pack or a heating pad. ? Place a towel between your skin and the heat source. ?  Leave the heat on for 20-30 minutes. ? Remove the heat if your skin turns bright red. This is very important if you are unable to feel pain, heat, or cold. You may have a greater risk of getting burned.  You may try a gentle neck and shoulder rub (massage).      Activity  Rest as needed.  Return to your normal activities as told by your doctor. Ask your doctor what activities are safe for you.  Do exercises as told by your doctor or physical therapist.  Do not lift anything that is heavier than 10 lb (4.5 kg) until your doctor tells you that it is safe. General instructions  Use a flat pillow when you sleep.  Do not drive while wearing a soft neck collar. If you do not have a soft neck collar, ask your  doctor if it is safe to drive while your neck heals.  Ask your doctor if the medicine prescribed to you requires you to avoid driving or using heavy machinery.  Do not use any products that contain nicotine or tobacco, such as cigarettes, e-cigarettes, and chewing tobacco. These can delay healing. If you need help quitting, ask your doctor.  Keep all follow-up visits as told by your doctor. This is important. Contact a doctor if:  Your condition does not get better with treatment. Get help right away if:  Your pain gets worse and is not helped with medicine.  You lose feeling or feel weak in your hand, arm, face, or leg.  You have a high fever.  You have a stiff neck.  You cannot control when you poop or pee (have incontinence).  You have trouble with walking, balance, or talking. Summary  Cervical radiculopathy means that a nerve in the neck is pinched or bruised.  A nerve can get pinched from a bulging disk, arthritis, an injury to the neck, or other causes.  Symptoms include pain, tingling, or loss of feeling that goes from the neck into the arm or hand.  Weakness in your arm or hand can happen in very bad cases.  Treatment may include resting, wearing a soft neck collar, and doing exercises. You might need to take medicines for pain. In very bad cases, shots or surgery may be needed. This information is not intended to replace advice given to you by your health care provider. Make sure you discuss any questions you have with your health care provider. Document Revised: 02/04/2018 Document Reviewed: 02/04/2018 Elsevier Patient Education  2021 [employer].

## 2020-08-19 ENCOUNTER — Encounter (INDEPENDENT_AMBULATORY_CARE_PROVIDER_SITE_OTHER): Payer: Medicare Other | Admitting: Ophthalmology

## 2020-08-19 NOTE — Progress Notes (Signed)
Cervical spine x-ray shows no fracture.  It does show some arthritis.

## 2020-08-19 NOTE — Progress Notes (Signed)
Left shoulder x-ray looks normal to radiology

## 2020-08-22 ENCOUNTER — Ambulatory Visit (INDEPENDENT_AMBULATORY_CARE_PROVIDER_SITE_OTHER): Payer: Medicare Other | Admitting: Ophthalmology

## 2020-08-22 ENCOUNTER — Other Ambulatory Visit: Payer: Self-pay

## 2020-08-22 ENCOUNTER — Encounter (INDEPENDENT_AMBULATORY_CARE_PROVIDER_SITE_OTHER): Payer: Self-pay | Admitting: Ophthalmology

## 2020-08-22 DIAGNOSIS — H353211 Exudative age-related macular degeneration, right eye, with active choroidal neovascularization: Secondary | ICD-10-CM

## 2020-08-22 NOTE — Progress Notes (Signed)
08/22/2020     CHIEF COMPLAINT Patient presents for Blurred Vision (WIP blurred VA OD following a fall//Pt c/o blurry VA OD x 2 weeks. Pt sts he suffered a fall a a couple weeks ago approx, and sts his VA OD is blurry and the eye is watery. Pt sts he does not recall if blur occurred immediately after the fall or not. Pt denies ocular pain. Pt denies flashes or floaters OU. VA OS stable.)   HISTORY OF PRESENT ILLNESS: Mark Clayton is a 80 y.o. male who presents to the clinic today for:  Patient had concerns after recent fall with some bony injuries to extremities and concerned that his eye may have been injured or more blurry, right eye  HPI    Blurred Vision    Laterality: right eye   Onset: unknown   Quality: blurred   Severity: mild   Onset: 2 weeks ago   Frequency: constantly   Timing: throughout the day   Course: stable   Associated symptoms: tearing   Treatments tried: no treatments   Comments: WIP blurred VA OD following a fall  Pt c/o blurry VA OD x 2 weeks. Pt sts he suffered a fall a a couple weeks ago approx, and sts his VA OD is blurry and the eye is watery. Pt sts he does not recall if blur occurred immediately after the fall or not. Pt denies ocular pain. Pt denies flashes or floaters OU. VA OS stable.          Comments    The patient the fall and was worried about his eye right eye       Last edited by Hurman Horn, MD on 08/22/2020  4:19 PM. (History)      Referring physician: [name], MD [place],  [place] [address]  HISTORICAL INFORMATION:   Selected notes from the MEDICAL RECORD NUMBER    Lab Results  Component Value Date   HGBA1C 6.1 (H) 06/07/2019     CURRENT MEDICATIONS: No current outpatient medications on file. (Ophthalmic Drugs)   No current facility-administered medications for this visit. (Ophthalmic Drugs)   Current Outpatient Medications (Other)  Medication Sig  . acetaminophen (TYLENOL) 325 MG tablet  Take 1-2 tablets (325-650 mg total) by mouth every 4 (four) hours as needed for mild pain.  Marland Kitchen ALPRAZolam (XANAX) 0.25 MG tablet Take 1 tablet (0.25 mg total) by mouth 2 (two) times daily as needed for anxiety.  Marland Kitchen atorvastatin (LIPITOR) 80 MG tablet TAKE 1 TABLET BY MOUTH  DAILY  . carvedilol (COREG) 6.25 MG tablet TAKE 1 TABLET BY MOUTH  TWICE DAILY WITH A MEAL  . clopidogrel (PLAVIX) 75 MG tablet TAKE 1 TABLET BY MOUTH  DAILY  . ezetimibe (ZETIA) 10 MG tablet TAKE 1 TABLET BY MOUTH  DAILY  . furosemide (LASIX) 20 MG tablet Take 1 tablet (20 mg total) by mouth daily.  . isosorbide mononitrate (IMDUR) 30 MG 24 hr tablet Take 1 tablet (30 mg total) by mouth daily.  Marland Kitchen losartan (COZAAR) 50 MG tablet TAKE 1 TABLET BY MOUTH  DAILY  . meclizine (ANTIVERT) 25 MG tablet Take 1 tablet (25 mg total) by mouth daily as needed for dizziness.  . mirtazapine (REMERON) 15 MG tablet Take 1 tablet (15 mg total) by mouth at bedtime.  . nitroGLYCERIN (NITROSTAT) 0.4 MG SL tablet DISSOLVE 1 TABLET UNDER THE TONGUE EVERY 5 MINUTES AS  NEEDED FOR CHEST PAIN. MAX  OF 3  TABLETS IN 15 MINUTES. CALL 911 IF PAIN PERSISTS.  Marland Kitchen pantoprazole (PROTONIX) 40 MG tablet TAKE 1 TABLET BY MOUTH  DAILY  . polyethylene glycol (MIRALAX / GLYCOLAX) 17 g packet Take 17 g by mouth as needed.  . potassium chloride (KLOR-CON) 10 MEQ tablet Take 1 tablet (10 mEq total) by mouth daily.  . rivaroxaban (XARELTO) 20 MG TABS tablet Take 1 tablet (20 mg total) by mouth daily with supper.  . vitamin B-12 (CYANOCOBALAMIN) 100 MCG tablet Take 100 mcg by mouth daily.    No current facility-administered medications for this visit. (Other)      REVIEW OF SYSTEMS:    ALLERGIES Allergies  Allergen Reactions  . Buspar [Buspirone] Other (See Comments)    Fatigue, body aches and shortness of breath   . Latex Rash  . Tape Rash and Other (See Comments)    USE PAPER    PAST MEDICAL HISTORY Past Medical History:  Diagnosis Date  . AICD  (automatic cardioverter/defibrillator) present 01/17/2003   Medtronic Maximo 7232CX ICD, serial [id number]  . Anemia 02-06-11   takes oral iron  . Arthritis    hands, knees  . CAD (coronary artery disease) 2003   a. h/o MI and CABG in 2003. b. s/p DES to SVG-RPDA-RPLB in 08/2014.  Marland Kitchen Cancer of sigmoid colon ([place]) 2012   a. s/p colon surgery.  . Carotid bruit   . Chronic systolic CHF (congestive heart failure) (HCC)    a. EF 20% in 2014; b. 08/2017 Echo: EF 20-25%, diff HK, Gr3 DD. Triv AI. Mod MR. Sev dil LA. Mildly dil RV w/ mildly reduced RV fxn. Mildly dil RA. Mod TR. PASP 38mHg.  Marland Kitchen Cough   . Exudative age-related macular degeneration of left eye with active choroidal neovascularization ([place]) 07/12/2019  . Exudative age-related macular degeneration, right eye, with inactive choroidal neovascularization ([place]) 07/12/2019  . GERD (gastroesophageal reflux disease) 02-06-11  . HTN (hypertension)   . Hyperlipidemia   . Ischemic cardiomyopathy    a. EF 20% in 2014. (Master study EF >20%); b. 08/2017 Echo: EF 20-25%, diff HK. Gr3 DD.  . LV (left ventricular) mural thrombus    a. 12/2012 Echo: EF 20% with mural thrombus No evidence of thrombus on 08/2017 echo.  . Macular degeneration   . Myocardial infarct ([place])    2003  . PAF (paroxysmal atrial fibrillation) (HCC)    a. CHA2DS2VASc = 5-->Xarelto/Tikosyn.   Past Surgical History:  Procedure Laterality Date  . CARDIAC CATHETERIZATION N/A 09/20/2014   Procedure: Left Heart Cath and Coronary Angiography;  Surgeon: Jettie Booze, MD; LAD 95%, D1 100%, CFX liner percent, OM 200%, OM 390%, RCA 90%, LIMA-LAD okay, SVG-OM 2-OM 3 minimal disease, SVG-RPDA-RPLB 100% between the RPDA and RPL     . CARDIAC CATHETERIZATION N/A 09/20/2014   Procedure: Coronary Stent Intervention;  Surgeon: Jettie Booze, MD; Synergy DES 4 x 24 mm reducing the stenosis to 5%   . CARDIAC DEFIBRILLATOR PLACEMENT  01/17/03   6949 lead. Medtronic. remote-no; with later  revision  . CARDIOVERSION N/A 05/22/2016   Procedure: CARDIOVERSION;  Surgeon: Lelon Perla, MD;  [facility]: [facility] ENDOSCOPY;  Service: Cardiovascular;  Laterality: N/A;  . CARDIOVERSION N/A 08/10/2017   Procedure: CARDIOVERSION;  Surgeon: Pixie Casino, MD;  Location: [facility] ENDOSCOPY;  Service: Cardiovascular;  Laterality: N/A;  . CATARACT EXTRACTION W/ INTRAOCULAR LENS  IMPLANT, BILATERAL Bilateral June/-July 2009   Dr. Katy Fitch  . CATARACT EXTRACTION W/PHACO Bilateral 2010   Dr. Katy Fitch  .  COLON RESECTION  02/09/2011   Procedure: LAPAROSCOPIC SIGMOID COLON RESECTION;  Surgeon: Pedro Earls, MD;  Location: [facility] ORS;  Service: General;  Laterality: N/A;  Laparoscopic Assisted Sigmoid Colectomy  . COLON SURGERY    . COLONOSCOPY  08/31/2011   Procedure: COLONOSCOPY;  Surgeon: Jerene Bears, MD;  Location: [facility] ENDOSCOPY;  Service: Gastroenterology;  Laterality: N/A;  . COLONOSCOPY N/A 09/05/2012   Procedure: COLONOSCOPY;  Surgeon: Jerene Bears, MD;  Location: [facility] ENDOSCOPY;  Service: Gastroenterology;  Laterality: N/A;  . COLONOSCOPY N/A 04/18/2013   Procedure: COLONOSCOPY;  Surgeon: Jerene Bears, MD;  Location: [facility] ENDOSCOPY;  Service: Gastroenterology;  Laterality: N/A;  . COLONOSCOPY N/A 04/09/2014   Procedure: COLONOSCOPY;  Surgeon: Jerene Bears, MD;  Location: [facility] ENDOSCOPY;  Service: Gastroenterology;  Laterality: N/A;  . COLONOSCOPY WITH PROPOFOL N/A 05/03/2017   Procedure: COLONOSCOPY WITH PROPOFOL;  Surgeon: Jerene Bears, MD;  Location: [facility] ENDOSCOPY;  Service: Gastroenterology;  Laterality: N/A;  . CORONARY ANGIOPLASTY    . CORONARY ARTERY BYPASS GRAFT  01/2002   LIMA-LAD, SVG-OM 2-OM 3, SVG-RPDA-RPLB  . CORONARY STENT INTERVENTION N/A 11/22/2018   Procedure: CORONARY STENT INTERVENTION;  Surgeon: Nelva Bush, MD;  Location: [place] CV LAB;  Service: Cardiovascular;  Laterality: N/A;  SVG - RCA  . ICD GENERATOR CHANGE  2010   Medtronic Virtuoso II VR ICD  . ICD GENERATOR CHANGEOUT N/A  12/07/2018   Procedure: ICD GENERATOR CHANGEOUT;  Surgeon: Deboraha Sprang, MD;  Location: [place] CV LAB;  Service: Cardiovascular;  Laterality: N/A;  . INGUINAL HERNIA REPAIR Right 2000's X 2  . LAPAROSCOPIC RIGHT HEMI COLECTOMY N/A 11/04/2012   Procedure: LAPAROSCOPIC RIGHT HEMI COLECTOMY;  Surgeon: Pedro Earls, MD;  Location: [facility] ORS;  Service: General;  Laterality: N/A;  . LEFT HEART CATH AND CORS/GRAFTS ANGIOGRAPHY Left 11/22/2018   Procedure: LEFT HEART CATH AND CORS/GRAFTS ANGIOGRAPHY;  Surgeon: Nelva Bush, MD;  Location: [place] CV LAB;  Service: Cardiovascular;  Laterality: Left;  . TONSILLECTOMY  ~ 1950    FAMILY HISTORY Family History  Problem Relation Age of Onset  . Hypertension Father   . Hyperlipidemia Father   . Heart disease Father   . Prostate cancer Father   . Alzheimer's disease Mother   . Hypertension Sister   . Hyperlipidemia Sister   . Colon cancer Neg Hx   . Esophageal cancer Neg Hx   . Rectal cancer Neg Hx   . Stomach cancer Neg Hx     SOCIAL HISTORY Social History   Tobacco Use  . Smoking status: Never Smoker  . Smokeless tobacco: Never Used  Vaping Use  . Vaping Use: Never used  Substance Use Topics  . Alcohol use: Yes    Alcohol/week: 2.0 standard drinks    Types: 2 Cans of beer per week  . Drug use: No         OPHTHALMIC EXAM: Base Eye Exam    Visual Acuity (ETDRS)      Right Left   Dist [place] 20/80 -3 CF at 3'   Dist ph [place] NI NI       Tonometry (Tonopen, 3:34 PM)      Right Left   Pressure 13 16       Pupils      Pupils Dark Light Shape React APD   Right PERRL 3 3 Round Minimal None   Left PERRL 3 3 Round Minimal None       Visual Fields (  Counting fingers)      Left Right    Full Full       Extraocular Movement      Right Left    Full Full       Neuro/Psych    Oriented x3: Yes   Mood/Affect: Normal       Dilation    Right eye: 1.0% Mydriacyl, 2.5% Phenylephrine @ 3:34 PM        Slit Lamp and  Fundus Exam    External Exam      Right Left   External Normal Normal       Slit Lamp Exam      Right Left   Lids/Lashes Normal Normal   Conjunctiva/Sclera White and quiet White and quiet   Cornea Clear Clear   Anterior Chamber Deep and quiet Deep and quiet   Iris Round and reactive Round and reactive   Lens Posterior chamber intraocular lens Posterior chamber intraocular lens   Anterior Vitreous Normal Normal       Fundus Exam      Right Left   Posterior Vitreous Normal    Disc Normal    C/D Ratio 0.25    Macula Hard drusen, Advanced age related macular degeneration, Retinal pigment epithelial atrophy, Macular thickening, Subretinal neovascular membrane fibrotic    Vessels Normal    Periphery Normal           IMAGING AND PROCEDURES  Imaging and Procedures for 08/22/20  OCT, Retina - OU - Both Eyes       Right Eye Quality was good. Scan locations included subfoveal. Central Foveal Thickness: 295. Progression has been stable. Findings include abnormal foveal contour, disciform scar, cystoid macular edema.   Left Eye Quality was good. Scan locations included subfoveal. Central Foveal Thickness: 432. Progression has worsened. Findings include abnormal foveal contour.   Notes Disciform macular scar right eye centrally, with edges less active with less intraretinal fluid and CME.  Today some 3 weeks post injection, with much less intraretinal fluid as compared.  Disciform scar stable however OD.  No direct trauma periocular or intraocular from recent fall where the patient sustained some bony injuries, not not to the head and neck area                ASSESSMENT/PLAN:  No problem-specific Assessment & Plan notes found for this encounter.      ICD-10-CM   1. Exudative age-related macular degeneration of right eye with active choroidal neovascularization (HCC)  H35.3211 OCT, Retina - OU - Both Eyes    1.  No direct injury to the globe OD or OS.  2.  Macular  findings in the right eye in fact are improved after recent injection intravitreal for wet AMD, 3 weeks previous.  3.  Dilate and follow-up OD next as scheduled.  Ophthalmic Meds Ordered this visit:  No orders of the defined types were placed in this encounter.      Return for As scheduled, dilate, OD, EYLEA OCT.  There are no Patient Instructions on file for this visit.   Explained the diagnoses, plan, and follow up with the patient and they expressed understanding.  Patient expressed understanding of the importance of proper follow up care.   Clent Demark Maalik Pinn M.D. Diseases & Surgery of the Retina and Vitreous [facility] [place] 08/22/20     Abbreviations: M myopia (nearsighted); A astigmatism; H hyperopia (farsighted); P presbyopia; Mrx spectacle prescription;  CTL contact lenses; OD right eye;  OS left eye; OU both eyes  XT exotropia; ET esotropia; PEK punctate epithelial keratitis; PEE punctate epithelial erosions; DES dry eye syndrome; MGD meibomian gland dysfunction; ATs artificial tears; PFAT's preservative free artificial tears; [place] nuclear sclerotic cataract; PSC posterior subcapsular cataract; ERM epi-retinal membrane; PVD posterior vitreous detachment; RD retinal detachment; DM diabetes mellitus; DR diabetic retinopathy; NPDR non-proliferative diabetic retinopathy; PDR proliferative diabetic retinopathy; CSME clinically significant macular edema; DME diabetic macular edema; dbh dot blot hemorrhages; CWS cotton wool spot; POAG primary open angle glaucoma; C/D cup-to-disc ratio; HVF humphrey visual field; GVF goldmann visual field; OCT optical coherence tomography; IOP intraocular pressure; BRVO Branch retinal vein occlusion; CRVO central retinal vein occlusion; CRAO central retinal artery occlusion; BRAO branch retinal artery occlusion; RT retinal tear; SB scleral buckle; PPV pars plana vitrectomy; VH Vitreous hemorrhage; PRP panretinal laser photocoagulation; IVK  intravitreal kenalog; VMT vitreomacular traction; MH Macular hole;  NVD neovascularization of the disc; NVE neovascularization elsewhere; AREDS age related eye disease study; ARMD age related macular degeneration; POAG primary open angle glaucoma; EBMD epithelial/anterior basement membrane dystrophy; ACIOL anterior chamber intraocular lens; IOL intraocular lens; PCIOL posterior chamber intraocular lens; Phaco/IOL phacoemulsification with intraocular lens placement; [place] photorefractive keratectomy; LASIK laser assisted in situ keratomileusis; HTN hypertension; DM diabetes mellitus; COPD chronic obstructive pulmonary disease

## 2020-08-23 DIAGNOSIS — I5022 Chronic systolic (congestive) heart failure: Secondary | ICD-10-CM | POA: Diagnosis not present

## 2020-08-23 DIAGNOSIS — Z7902 Long term (current) use of antithrombotics/antiplatelets: Secondary | ICD-10-CM | POA: Diagnosis not present

## 2020-08-23 DIAGNOSIS — I69351 Hemiplegia and hemiparesis following cerebral infarction affecting right dominant side: Secondary | ICD-10-CM | POA: Diagnosis not present

## 2020-08-23 DIAGNOSIS — Z7901 Long term (current) use of anticoagulants: Secondary | ICD-10-CM | POA: Diagnosis not present

## 2020-08-23 DIAGNOSIS — Z950 Presence of cardiac pacemaker: Secondary | ICD-10-CM | POA: Diagnosis not present

## 2020-08-23 DIAGNOSIS — M21371 Foot drop, right foot: Secondary | ICD-10-CM | POA: Diagnosis not present

## 2020-08-28 ENCOUNTER — Telehealth: Payer: Self-pay | Admitting: Internal Medicine

## 2020-08-28 NOTE — Telephone Encounter (Signed)
Colletta Maryland w/ [facility] called and asked if the plan of care has been received. She said that the start date was 07-22-20. She said that she faxed the papers on 5/11 and 5/26. Please advise   Okay to LVM: [phone]

## 2020-08-28 NOTE — Telephone Encounter (Signed)
LVM letting Colletta Maryland know that the paperwork had been signed and faxed back on 08/19/2020. Confirmation fax was received. Office number was provided

## 2020-08-30 DIAGNOSIS — I5022 Chronic systolic (congestive) heart failure: Secondary | ICD-10-CM | POA: Diagnosis not present

## 2020-08-30 DIAGNOSIS — Z7902 Long term (current) use of antithrombotics/antiplatelets: Secondary | ICD-10-CM | POA: Diagnosis not present

## 2020-08-30 DIAGNOSIS — Z7901 Long term (current) use of anticoagulants: Secondary | ICD-10-CM | POA: Diagnosis not present

## 2020-08-30 DIAGNOSIS — M21371 Foot drop, right foot: Secondary | ICD-10-CM | POA: Diagnosis not present

## 2020-08-30 DIAGNOSIS — I69351 Hemiplegia and hemiparesis following cerebral infarction affecting right dominant side: Secondary | ICD-10-CM | POA: Diagnosis not present

## 2020-08-30 DIAGNOSIS — Z950 Presence of cardiac pacemaker: Secondary | ICD-10-CM | POA: Diagnosis not present

## 2020-09-02 ENCOUNTER — Telehealth: Payer: Self-pay | Admitting: Internal Medicine

## 2020-09-02 NOTE — Telephone Encounter (Signed)
See below

## 2020-09-02 NOTE — Telephone Encounter (Signed)
    [place] Name: [place] Name: [place] Phone #: [phone], ok to leave a message Service Requested: EXTEND PT Frequency of Visits: 1W3, starting 09/01/20

## 2020-09-02 NOTE — Telephone Encounter (Signed)
Okay for verbals °

## 2020-09-03 NOTE — Telephone Encounter (Signed)
Spoke with Elta Guadeloupe to give verbal orders. No questions or concerns at this time.

## 2020-09-09 ENCOUNTER — Ambulatory Visit (INDEPENDENT_AMBULATORY_CARE_PROVIDER_SITE_OTHER): Payer: Medicare Other

## 2020-09-09 DIAGNOSIS — Z95810 Presence of automatic (implantable) cardiac defibrillator: Secondary | ICD-10-CM

## 2020-09-09 DIAGNOSIS — I5022 Chronic systolic (congestive) heart failure: Secondary | ICD-10-CM | POA: Diagnosis not present

## 2020-09-09 NOTE — Progress Notes (Signed)
EPIC Encounter for ICM Monitoring  Patient Name: Mark Clayton is a 80 y.o. male Date: 09/09/2020 Primary Care Physican: [name], MD Cardiologist: Marlou Porch Electrophysiologist: Caryl Comes 07/29/2020 Weight: 128 lbs   Time in AF:  24.0 hr/day (100.0%) (taking Xarelto)          Spoke with patient and reports swelling of his feet that comes and goes. Heart failure questions reviewed.          Optivol Thoracic impedance suggesting possible ongoing fluid accumulation pattern continues since 4/4.   Prescribed:  Furosemide 20 mg 1 tablet by mouth daily. Potassium 10 mEq take 1 tablet daily   Labs: 07/16/2020 Creatinine 1.05, BUN 14, Potassium 3.6, Sodium 139, GFR 67.47 07/05/2020 Creatinine 1.01, BUN 12, Potassium 4.3, Sodium 128, GFR 70.70 05/23/2020 Creatinine 0.96, BUN 13, Potassium 4.4, Sodium 134, GFR 75.21  A complete set of results can be found in Results Review.   Recommendations:  Granddaughter gives patient extra Furosemide when he has swelling and will do so for the next couple of days.    Follow-up plan: ICM clinic phone appointment on 10/14/2020.  91 day device clinic remote transmission 10/23/2020.     EP/Cardiology Office Visits: Recall 01/26/2021 with Dr Marlou Porch.  Recall for 10/12/2020 with Dr. Caryl Comes.     Copy of ICM check sent to Dr. Caryl Comes and Dr Marlou Porch.  3 month ICM trend: 09/09/2020.    1 Year ICM trend:       Rosalene Billings, RN 09/09/2020 10:30 AM

## 2020-09-12 ENCOUNTER — Telehealth: Payer: Self-pay | Admitting: Internal Medicine

## 2020-09-12 NOTE — Telephone Encounter (Signed)
Patient called and said that the gentleman that is supposed to massage his arm did not show today. He said that he was supposed to be at his home at 10:00am. He said that he did not know the gentlemans name or who he was with. Please advise

## 2020-09-13 NOTE — Progress Notes (Deleted)
   I, Mark Clayton, LAT, ATC, am serving as scribe for Dr. Lynne Leader.  Mark Clayton is a 80 y.o. male who presents to [place] [facility] today for L shoulder/scapular pain after suffering a fall in mid-May 2022.  Pt has a hx of L cerebral stroke on 06/06/19, resulting in R-sided weakness and altered gait and balance.  He was last seen by Dr. Georgina Snell on 08/16/20 and was advised to add his L shoulder pain to his previously prescribed home health PT.  Since his last visit, pt reports   Diagnostic testing: L shoulder and c-spine XR- 08/16/20  Pertinent review of systems: ***  Relevant historical information: ***   Exam:  There were no vitals taken for this visit. General: Well Developed, well nourished, and in no acute distress.   MSK:     Lab and Radiology Results No results found for this or any previous visit (from the past 72 hour(s)). No results found.     Assessment and Plan: 80 y.o. male with ***   PDMP not reviewed this encounter. No orders of the defined types were placed in this encounter.  No orders of the defined types were placed in this encounter.    Discussed warning signs or symptoms. Please see discharge instructions. Patient expresses understanding.

## 2020-09-14 DIAGNOSIS — Z950 Presence of cardiac pacemaker: Secondary | ICD-10-CM | POA: Diagnosis not present

## 2020-09-14 DIAGNOSIS — I5022 Chronic systolic (congestive) heart failure: Secondary | ICD-10-CM | POA: Diagnosis not present

## 2020-09-14 DIAGNOSIS — M21371 Foot drop, right foot: Secondary | ICD-10-CM | POA: Diagnosis not present

## 2020-09-14 DIAGNOSIS — Z7902 Long term (current) use of antithrombotics/antiplatelets: Secondary | ICD-10-CM | POA: Diagnosis not present

## 2020-09-14 DIAGNOSIS — Z7901 Long term (current) use of anticoagulants: Secondary | ICD-10-CM | POA: Diagnosis not present

## 2020-09-14 DIAGNOSIS — I69351 Hemiplegia and hemiparesis following cerebral infarction affecting right dominant side: Secondary | ICD-10-CM | POA: Diagnosis not present

## 2020-09-16 ENCOUNTER — Ambulatory Visit: Payer: Medicare Other | Admitting: Family Medicine

## 2020-09-16 ENCOUNTER — Other Ambulatory Visit: Payer: Self-pay | Admitting: Internal Medicine

## 2020-09-16 DIAGNOSIS — I5022 Chronic systolic (congestive) heart failure: Secondary | ICD-10-CM | POA: Diagnosis not present

## 2020-09-16 DIAGNOSIS — Z7902 Long term (current) use of antithrombotics/antiplatelets: Secondary | ICD-10-CM | POA: Diagnosis not present

## 2020-09-16 DIAGNOSIS — I69351 Hemiplegia and hemiparesis following cerebral infarction affecting right dominant side: Secondary | ICD-10-CM | POA: Diagnosis not present

## 2020-09-16 DIAGNOSIS — Z950 Presence of cardiac pacemaker: Secondary | ICD-10-CM | POA: Diagnosis not present

## 2020-09-16 DIAGNOSIS — Z7901 Long term (current) use of anticoagulants: Secondary | ICD-10-CM | POA: Diagnosis not present

## 2020-09-16 DIAGNOSIS — M21371 Foot drop, right foot: Secondary | ICD-10-CM | POA: Diagnosis not present

## 2020-09-16 NOTE — Telephone Encounter (Signed)
Called Mark from [facility] that he is scheduled to go out there later this week to see the patient and discharge him from physical therapy. Elta Guadeloupe is going to contact his assistant to see about the arm/shoulder/neck PT and if there was anything new. Patient did have an appointment with Dr. Georgina Snell he missed will call patient to see if maybe he just mixed up the dates of PT and sports medicine visit

## 2020-09-16 NOTE — Telephone Encounter (Signed)
Called patient stated he was unaware of his appointment with Dr. Georgina Snell but he wants to wait till [facility] comes out and will call us to schedule if he needs [place].

## 2020-09-16 NOTE — Telephone Encounter (Signed)
Caller Name: [place] Name: [place] Phone #: [phone], ok to leave a message

## 2020-09-18 ENCOUNTER — Telehealth: Payer: Self-pay | Admitting: Internal Medicine

## 2020-09-18 NOTE — Telephone Encounter (Signed)
[place] Name: [facility] Agency Name: Moreen Fowler Phone #: [phone], ok for VM  Service Requested: PT (examples: OT/PT/Skilled Nursing/Social Work/Speech Therapy/Wound Care)  Frequency of Visits: 2X for 3WKs , 1X for 2 Wks starting on June 26th

## 2020-09-18 NOTE — Telephone Encounter (Signed)
    Patient said that he had someone come to his home the other day and check his A1C. He said that they told him that it was high and he needed to contact his PCP. He did not know the number. He can be reached at [phone]. Please advise

## 2020-09-19 NOTE — Telephone Encounter (Signed)
LVM asking the patient to return my call here at the office. Office number was provided.

## 2020-09-19 NOTE — Telephone Encounter (Signed)
Patient called again

## 2020-09-19 NOTE — Telephone Encounter (Signed)
See below

## 2020-09-23 NOTE — Telephone Encounter (Signed)
Fine for verbals °

## 2020-09-23 NOTE — Telephone Encounter (Signed)
Verbal orders given  

## 2020-09-24 DIAGNOSIS — Z7901 Long term (current) use of anticoagulants: Secondary | ICD-10-CM | POA: Diagnosis not present

## 2020-09-24 DIAGNOSIS — I69351 Hemiplegia and hemiparesis following cerebral infarction affecting right dominant side: Secondary | ICD-10-CM | POA: Diagnosis not present

## 2020-09-24 DIAGNOSIS — I5022 Chronic systolic (congestive) heart failure: Secondary | ICD-10-CM | POA: Diagnosis not present

## 2020-09-24 DIAGNOSIS — Z950 Presence of cardiac pacemaker: Secondary | ICD-10-CM | POA: Diagnosis not present

## 2020-09-24 DIAGNOSIS — Z7902 Long term (current) use of antithrombotics/antiplatelets: Secondary | ICD-10-CM | POA: Diagnosis not present

## 2020-09-24 DIAGNOSIS — M21371 Foot drop, right foot: Secondary | ICD-10-CM | POA: Diagnosis not present

## 2020-09-26 ENCOUNTER — Other Ambulatory Visit: Payer: Self-pay

## 2020-09-26 ENCOUNTER — Ambulatory Visit (INDEPENDENT_AMBULATORY_CARE_PROVIDER_SITE_OTHER): Payer: Medicare Other

## 2020-09-26 ENCOUNTER — Encounter: Payer: Self-pay | Admitting: Internal Medicine

## 2020-09-26 ENCOUNTER — Ambulatory Visit (INDEPENDENT_AMBULATORY_CARE_PROVIDER_SITE_OTHER): Payer: Medicare Other | Admitting: Internal Medicine

## 2020-09-26 VITALS — BP 116/70 | HR 77 | Temp 97.6°F | Resp 18 | Ht 72.0 in | Wt 134.2 lb

## 2020-09-26 DIAGNOSIS — I5022 Chronic systolic (congestive) heart failure: Secondary | ICD-10-CM | POA: Diagnosis not present

## 2020-09-26 DIAGNOSIS — R410 Disorientation, unspecified: Secondary | ICD-10-CM

## 2020-09-26 DIAGNOSIS — R29818 Other symptoms and signs involving the nervous system: Secondary | ICD-10-CM | POA: Diagnosis not present

## 2020-09-26 DIAGNOSIS — R443 Hallucinations, unspecified: Secondary | ICD-10-CM

## 2020-09-26 DIAGNOSIS — Z8673 Personal history of transient ischemic attack (TIA), and cerebral infarction without residual deficits: Secondary | ICD-10-CM | POA: Diagnosis not present

## 2020-09-26 DIAGNOSIS — I517 Cardiomegaly: Secondary | ICD-10-CM | POA: Diagnosis not present

## 2020-09-26 DIAGNOSIS — R7303 Prediabetes: Secondary | ICD-10-CM | POA: Diagnosis not present

## 2020-09-26 DIAGNOSIS — R0781 Pleurodynia: Secondary | ICD-10-CM | POA: Diagnosis not present

## 2020-09-26 DIAGNOSIS — R0602 Shortness of breath: Secondary | ICD-10-CM | POA: Diagnosis not present

## 2020-09-26 LAB — COMPREHENSIVE METABOLIC PANEL
ALT: 12 U/L (ref 0–53)
AST: 17 U/L (ref 0–37)
Albumin: 3.5 g/dL (ref 3.5–5.2)
Alkaline Phosphatase: 99 U/L (ref 39–117)
BUN: 24 mg/dL — ABNORMAL HIGH (ref 6–23)
CO2: 28 mEq/L (ref 19–32)
Calcium: 9.1 mg/dL (ref 8.4–10.5)
Chloride: 104 mEq/L (ref 96–112)
Creatinine, Ser: 1.33 mg/dL (ref 0.40–1.50)
GFR: 50.73 mL/min — ABNORMAL LOW (ref >60.00)
Glucose, Bld: 172 mg/dL — ABNORMAL HIGH (ref 70–99)
Potassium: 4.1 mEq/L (ref 3.5–5.1)
Sodium: 139 mEq/L (ref 135–145)
Total Bilirubin: 1.6 mg/dL — ABNORMAL HIGH (ref 0.2–1.2)
Total Protein: 6 g/dL (ref 6.0–8.3)

## 2020-09-26 LAB — CBC
HCT: 33.5 % — ABNORMAL LOW (ref 39.0–52.0)
Hemoglobin: 11 g/dL — ABNORMAL LOW (ref 13.0–17.0)
MCHC: 32.8 g/dL (ref 30.0–36.0)
MCV: 88.7 fl (ref 78.0–100.0)
Platelets: 176 10*3/uL (ref 150.0–400.0)
RBC: 3.77 Mil/uL — ABNORMAL LOW (ref 4.22–5.81)
RDW: 16.6 % — ABNORMAL HIGH (ref 11.5–15.5)
WBC: 4.9 10*3/uL (ref 4.0–10.5)

## 2020-09-26 LAB — VITAMIN D 25 HYDROXY (VIT D DEFICIENCY, FRACTURES): VITD: 22.38 ng/mL — ABNORMAL LOW (ref 30.00–100.00)

## 2020-09-26 LAB — TSH: TSH: 5.8 u[IU]/mL — ABNORMAL HIGH (ref 0.35–5.50)

## 2020-09-26 LAB — HEMOGLOBIN A1C: Hgb A1c MFr Bld: 6.7 % — ABNORMAL HIGH (ref 4.6–6.5)

## 2020-09-26 LAB — VITAMIN B12: Vitamin B-12: >1550 pg/mL — ABNORMAL HIGH (ref 211–911)

## 2020-09-26 NOTE — Patient Instructions (Addendum)
We will get a CT scan of the head to check and get you in with a neurologist to check for seizure. We are getting labs and chest x-ray today.  We will have you stop the mirtazepine to see if this is causing some of the problems.

## 2020-09-26 NOTE — Progress Notes (Signed)
   Subjective:   Patient ID: Mark Clayton, male    DOB: 11-02-1940, 80 y.o.   MRN: [id number]  HPI The patient is a 80 YO man coming in for concerns about absence seizures. He had one a few days ago after a panic attack. He does have past stroke and ICH. Last CT head 2021. Has ICD and it has not fired recently. Also some concerns about audiotory and visual hallucinations of people and animals there which are not there in the last month since starting remeron. This is not helping with mood or sleep either.   Review of Systems  Constitutional: Negative.   HENT: Negative.    Eyes: Negative.   Respiratory:  Negative for cough, chest tightness and shortness of breath.   Cardiovascular:  Negative for chest pain, palpitations and leg swelling.  Gastrointestinal:  Negative for abdominal distention, abdominal pain, constipation, diarrhea, nausea and vomiting.  Musculoskeletal: Negative.   Skin: Negative.   Neurological: Negative.   Psychiatric/Behavioral:  Positive for hallucinations and sleep disturbance.    Objective:  Physical Exam Constitutional:      Appearance: He is well-developed.  HENT:     Head: Normocephalic and atraumatic.  Cardiovascular:     Rate and Rhythm: Normal rate and regular rhythm.  Pulmonary:     Effort: Pulmonary effort is normal. No respiratory distress.     Breath sounds: Normal breath sounds. No wheezing or rales.  Abdominal:     General: Bowel sounds are normal. There is no distension.     Palpations: Abdomen is soft.     Tenderness: There is no abdominal tenderness. There is no rebound.  Musculoskeletal:     Cervical back: Normal range of motion.  Skin:    General: Skin is warm and dry.  Neurological:     Mental Status: He is alert and oriented to person, place, and time. Mental status is at baseline.     Cranial Nerves: Cranial nerve deficit present.     Coordination: Coordination abnormal.     Comments: Some slurring of the speech today different from  normal    Vitals:   09/26/20 1052  BP: 116/70  Pulse: 77  Resp: 18  Temp: 97.6 F (36.4 C)  TempSrc: Oral  SpO2: 97%  Weight: 134 lb 3.2 oz (60.9 kg)  Height: 6' (1.829 m)    This visit occurred during the SARS-CoV-2 public health emergency.  Safety protocols were in place, including screening questions prior to the visit, additional usage of staff PPE, and extensive cleaning of exam room while observing appropriate contact time as indicated for disinfecting solutions.   Assessment & Plan:

## 2020-09-27 ENCOUNTER — Encounter: Payer: Self-pay | Admitting: Internal Medicine

## 2020-09-27 DIAGNOSIS — I5022 Chronic systolic (congestive) heart failure: Secondary | ICD-10-CM | POA: Diagnosis not present

## 2020-09-27 DIAGNOSIS — R443 Hallucinations, unspecified: Secondary | ICD-10-CM | POA: Insufficient documentation

## 2020-09-27 DIAGNOSIS — Z7902 Long term (current) use of antithrombotics/antiplatelets: Secondary | ICD-10-CM | POA: Diagnosis not present

## 2020-09-27 DIAGNOSIS — R29818 Other symptoms and signs involving the nervous system: Secondary | ICD-10-CM | POA: Insufficient documentation

## 2020-09-27 DIAGNOSIS — Z950 Presence of cardiac pacemaker: Secondary | ICD-10-CM | POA: Diagnosis not present

## 2020-09-27 DIAGNOSIS — I69351 Hemiplegia and hemiparesis following cerebral infarction affecting right dominant side: Secondary | ICD-10-CM | POA: Diagnosis not present

## 2020-09-27 DIAGNOSIS — Z7901 Long term (current) use of anticoagulants: Secondary | ICD-10-CM | POA: Diagnosis not present

## 2020-09-27 DIAGNOSIS — M21371 Foot drop, right foot: Secondary | ICD-10-CM | POA: Diagnosis not present

## 2020-09-27 DIAGNOSIS — R7303 Prediabetes: Secondary | ICD-10-CM | POA: Insufficient documentation

## 2020-09-27 NOTE — Assessment & Plan Note (Signed)
Unclear etiology. Checking labs including CBC, CMP, vitamin D and B12. Checking CXR for occult infection. Checking CT head stat. Could be related to remeron as onset temporally matches. Will stop remeron.

## 2020-09-27 NOTE — Assessment & Plan Note (Signed)
Due for repeat HgA1c.

## 2020-09-27 NOTE — Assessment & Plan Note (Signed)
Given speech changes concern for new stroke and CT head ordered stat.

## 2020-09-27 NOTE — Assessment & Plan Note (Signed)
Some new episodes of possible absence seizure. Refer to neurology and CT head stat. Checking labs to rule out metabolic cause. Checking CXR for occult infection. Possible medication side effects so simplifying regimen where able.

## 2020-10-01 ENCOUNTER — Telehealth: Payer: Self-pay | Admitting: Cardiology

## 2020-10-01 DIAGNOSIS — I5022 Chronic systolic (congestive) heart failure: Secondary | ICD-10-CM

## 2020-10-01 DIAGNOSIS — M21371 Foot drop, right foot: Secondary | ICD-10-CM | POA: Diagnosis not present

## 2020-10-01 DIAGNOSIS — Z79899 Other long term (current) drug therapy: Secondary | ICD-10-CM

## 2020-10-01 DIAGNOSIS — Z950 Presence of cardiac pacemaker: Secondary | ICD-10-CM | POA: Diagnosis not present

## 2020-10-01 DIAGNOSIS — I69351 Hemiplegia and hemiparesis following cerebral infarction affecting right dominant side: Secondary | ICD-10-CM | POA: Diagnosis not present

## 2020-10-01 DIAGNOSIS — Z7902 Long term (current) use of antithrombotics/antiplatelets: Secondary | ICD-10-CM | POA: Diagnosis not present

## 2020-10-01 DIAGNOSIS — Z7901 Long term (current) use of anticoagulants: Secondary | ICD-10-CM | POA: Diagnosis not present

## 2020-10-01 NOTE — Telephone Encounter (Signed)
Called to speak with pt regarding his SOB.  He reports having a NP cough last night, unable to sleep (uses 3 pillows).  Pt was having trouble hearing and asked me to speak with [facility].  [name] reports pts wt is 133 lbs which is up a little but pt has been trying to eat more since he lost wt while in the hospital previously.  He has had edema at feet/ankles bilaterally and is SOB at rest and with activity.  [name] reports she increased his dose of Furosemide to 20 mg BID yesterday 09/30/20.  Advised to continue this dose X 3 days and then return to normal dose.  Advised to have blood work in Friday (BMP).  He requests this be completed at Dr Nathanial Millman office.  Advised to call back if no improvement in s/s or if they worsen.  [name] expressed understanding.    Will forward to both Dr Sharlet Salina and Dr Marlou Porch for their knowledge.

## 2020-10-01 NOTE — Telephone Encounter (Signed)
Pt c/o Shortness Of Breath: STAT if SOB developed within the last 24 hours or pt is noticeably SOB on the phone  1. Are you currently SOB (can you hear that pt is SOB on the phone)? A little  2. How long have you been experiencing SOB? A couple months  3. Are you SOB when sitting or when up moving around? Comes and goes no matter what he is doing  4. Are you currently experiencing any other symptoms? Occasional cough    Patient has been experiencing SOB for a couple months. He went to another doctor on Friday and had an x-ray done and there was fluid on his L lung. The patient was told to call his Cardiologist

## 2020-10-01 NOTE — Telephone Encounter (Signed)
Returned patient call as requested by voice mail message.  Pt reports calling Dr Marlou Porch office this AM to report fluid symptoms.  He asked to have the transmission reviewed he sent to check fluid levels.  Report received.  Advised pt will include the report in the call note that patient made to office earlier this morning.    10/01/2020 Optivol thoracic impedance continues to suggest possible ongoing fluid accumulation since 07/01/2020.

## 2020-10-03 DIAGNOSIS — I69351 Hemiplegia and hemiparesis following cerebral infarction affecting right dominant side: Secondary | ICD-10-CM | POA: Diagnosis not present

## 2020-10-03 DIAGNOSIS — M21371 Foot drop, right foot: Secondary | ICD-10-CM | POA: Diagnosis not present

## 2020-10-03 DIAGNOSIS — Z7902 Long term (current) use of antithrombotics/antiplatelets: Secondary | ICD-10-CM | POA: Diagnosis not present

## 2020-10-03 DIAGNOSIS — Z7901 Long term (current) use of anticoagulants: Secondary | ICD-10-CM | POA: Diagnosis not present

## 2020-10-03 DIAGNOSIS — I5022 Chronic systolic (congestive) heart failure: Secondary | ICD-10-CM | POA: Diagnosis not present

## 2020-10-03 DIAGNOSIS — Z950 Presence of cardiac pacemaker: Secondary | ICD-10-CM | POA: Diagnosis not present

## 2020-10-08 ENCOUNTER — Encounter: Payer: Self-pay | Admitting: Internal Medicine

## 2020-10-08 ENCOUNTER — Other Ambulatory Visit: Payer: Self-pay

## 2020-10-08 ENCOUNTER — Ambulatory Visit (INDEPENDENT_AMBULATORY_CARE_PROVIDER_SITE_OTHER): Payer: Medicare Other | Admitting: Internal Medicine

## 2020-10-08 VITALS — BP 116/70 | HR 88 | Temp 97.7°F | Resp 18 | Ht 72.0 in

## 2020-10-08 DIAGNOSIS — I5022 Chronic systolic (congestive) heart failure: Secondary | ICD-10-CM | POA: Diagnosis not present

## 2020-10-08 DIAGNOSIS — K4021 Bilateral inguinal hernia, without obstruction or gangrene, recurrent: Secondary | ICD-10-CM | POA: Diagnosis not present

## 2020-10-08 LAB — BASIC METABOLIC PANEL
BUN: 21 mg/dL (ref 6–23)
CO2: 26 mEq/L (ref 19–32)
Calcium: 9.3 mg/dL (ref 8.4–10.5)
Chloride: 104 mEq/L (ref 96–112)
Creatinine, Ser: 1.22 mg/dL (ref 0.40–1.50)
GFR: 56.26 mL/min — ABNORMAL LOW (ref >60.00)
Glucose, Bld: 87 mg/dL (ref 70–99)
Potassium: 3.4 mEq/L — ABNORMAL LOW (ref 3.5–5.1)
Sodium: 139 mEq/L (ref 135–145)

## 2020-10-08 NOTE — Progress Notes (Signed)
   Subjective:   Patient ID: Mark Clayton, male    DOB: [birth year], 80 y.o.   MRN: [id number]  HPI The patient is a 80 YO man coming in for concerns about his known bilateral inguinal hernias. No pain currently sometimes feels lump there can get it to go away.   Review of Systems  Constitutional: Negative.   HENT: Negative.    Eyes: Negative.   Respiratory:  Negative for cough, chest tightness and shortness of breath.   Cardiovascular:  Negative for chest pain, palpitations and leg swelling.  Gastrointestinal:  Negative for abdominal distention, abdominal pain, constipation, diarrhea, nausea and vomiting.  Musculoskeletal: Negative.   Skin: Negative.   Neurological: Negative.   Psychiatric/Behavioral: Negative.     Objective:  Physical Exam Constitutional:      Appearance: He is well-developed.     Comments: Thin with temporal wasting  HENT:     Head: Normocephalic and atraumatic.  Cardiovascular:     Rate and Rhythm: Normal rate and regular rhythm.  Pulmonary:     Effort: Pulmonary effort is normal. No respiratory distress.     Breath sounds: Normal breath sounds. No wheezing or rales.  Abdominal:     General: Bowel sounds are normal. There is no distension.     Palpations: Abdomen is soft.     Tenderness: There is no abdominal tenderness. There is no rebound.  Musculoskeletal:     Cervical back: Normal range of motion.  Skin:    General: Skin is warm and dry.  Neurological:     Mental Status: He is alert and oriented to person, place, and time. Mental status is at baseline.     Coordination: Coordination abnormal.     Comments: wheelchair    Vitals:   10/08/20 1516  BP: 116/70  Pulse: 88  Resp: 18  Temp: 97.7 F (36.5 C)  TempSrc: Oral  SpO2: 100%  Height: 6' (1.829 m)    This visit occurred during the SARS-CoV-2 public health emergency.  Safety protocols were in place, including screening questions prior to the visit, additional usage of staff PPE, and  extensive cleaning of exam room while observing appropriate contact time as indicated for disinfecting solutions.   Assessment & Plan:

## 2020-10-08 NOTE — Patient Instructions (Addendum)
I would not recommend to fix the hernia at that time.  I would go back on the miralax to help avoid constipation.  It is okay to get the 4th shot or wait until the fall for the omicron specific shot.

## 2020-10-09 DIAGNOSIS — Z950 Presence of cardiac pacemaker: Secondary | ICD-10-CM | POA: Diagnosis not present

## 2020-10-09 DIAGNOSIS — I69351 Hemiplegia and hemiparesis following cerebral infarction affecting right dominant side: Secondary | ICD-10-CM | POA: Diagnosis not present

## 2020-10-09 DIAGNOSIS — Z7901 Long term (current) use of anticoagulants: Secondary | ICD-10-CM | POA: Diagnosis not present

## 2020-10-09 DIAGNOSIS — Z7902 Long term (current) use of antithrombotics/antiplatelets: Secondary | ICD-10-CM | POA: Diagnosis not present

## 2020-10-09 DIAGNOSIS — M21371 Foot drop, right foot: Secondary | ICD-10-CM | POA: Diagnosis not present

## 2020-10-09 DIAGNOSIS — I5022 Chronic systolic (congestive) heart failure: Secondary | ICD-10-CM | POA: Diagnosis not present

## 2020-10-10 DIAGNOSIS — I5022 Chronic systolic (congestive) heart failure: Secondary | ICD-10-CM | POA: Diagnosis not present

## 2020-10-10 DIAGNOSIS — I69351 Hemiplegia and hemiparesis following cerebral infarction affecting right dominant side: Secondary | ICD-10-CM | POA: Diagnosis not present

## 2020-10-10 DIAGNOSIS — M21371 Foot drop, right foot: Secondary | ICD-10-CM | POA: Diagnosis not present

## 2020-10-10 DIAGNOSIS — Z7902 Long term (current) use of antithrombotics/antiplatelets: Secondary | ICD-10-CM | POA: Diagnosis not present

## 2020-10-10 DIAGNOSIS — Z950 Presence of cardiac pacemaker: Secondary | ICD-10-CM | POA: Diagnosis not present

## 2020-10-10 DIAGNOSIS — Z7901 Long term (current) use of anticoagulants: Secondary | ICD-10-CM | POA: Diagnosis not present

## 2020-10-11 ENCOUNTER — Other Ambulatory Visit: Payer: Self-pay | Admitting: Internal Medicine

## 2020-10-11 ENCOUNTER — Other Ambulatory Visit: Payer: Self-pay

## 2020-10-11 ENCOUNTER — Encounter: Payer: Self-pay | Admitting: Neurology

## 2020-10-11 ENCOUNTER — Ambulatory Visit: Payer: Medicare Other | Admitting: Neurology

## 2020-10-11 VITALS — BP 96/55 | HR 85 | Ht 69.0 in | Wt 133.0 lb

## 2020-10-11 DIAGNOSIS — R404 Transient alteration of awareness: Secondary | ICD-10-CM

## 2020-10-11 DIAGNOSIS — R441 Visual hallucinations: Secondary | ICD-10-CM

## 2020-10-11 DIAGNOSIS — Z8673 Personal history of transient ischemic attack (TIA), and cerebral infarction without residual deficits: Secondary | ICD-10-CM | POA: Diagnosis not present

## 2020-10-11 DIAGNOSIS — K4020 Bilateral inguinal hernia, without obstruction or gangrene, not specified as recurrent: Secondary | ICD-10-CM | POA: Insufficient documentation

## 2020-10-11 NOTE — Assessment & Plan Note (Signed)
Due to recent change to lasix dosing needs BMP today. Adjust as needed.

## 2020-10-11 NOTE — Patient Instructions (Signed)
Schedule head CT without contrast  2. Schedule 1-hour EEG  3. Follow-up after tests, call for any changes

## 2020-10-11 NOTE — Assessment & Plan Note (Signed)
Seen on CT and fat containing. Given overall health I would not recommend elective repair at this time. These are recurrent and there is likely a lot of scar tissue making this a trickier repair.

## 2020-10-11 NOTE — Progress Notes (Signed)
NEUROLOGY CONSULTATION NOTE  Mark Clayton MRN: [id number] DOB: [birth year]  Referring provider: Dr. Pricilla Holm Primary care provider: Dr. Pricilla Holm  Reason for consult:  concern for seizure  Dear Dr Sharlet Salina:  Thank you for your kind referral of Mark Clayton for consultation of the above symptoms. Although his history is well known to you, please allow me to reiterate it for the purpose of our medical record. The patient was accompanied to the clinic by his granddaughter [name] who also provides collateral information. Records and images were personally reviewed where available.   HISTORY OF PRESENT ILLNESS: This is a 80 year old right-handed man with a history of atrial fibrillation on Xarelto, s/p ICD placement, hypertension, colon cancer, left MCA stroke with traumatic left vertex SDH in March 2021 with residual right hemiparesis presenting for evaluation of possible seizures. His granddaughter [name] lives with him. After the stroke in 05/2019, [facility] recalls that he had episodes of zoning out/unresponsive for 30 seconds to a couple of minutes that stopped for a while, then recurred, last episode was 3-4 weeks ago. There is no prior warning, he is amnestic of events. There would be a quick jerk after the staring. Lately his right arm feels like there is electricity. His right leg is numb, sometimes it hurts/aches. He has a walker at home. He had a fall a few months ago and since then started having visual hallucinations that are non-threatening to him. He says "I see things," he sees people, animals, figures, things on the floor. No auditory component. He says he sees them all the time, people dancing in the mirrors, animals playing outside. He blinks and they would be gone. One time he wanted to talk to a cop next to [place]. He was started on Remeron in May 2022. He gets disoriented mostly at night, but this occurs in the daytime as well. He says the pictures on the  walls change, he points to a painting of boats in the office, he says it changes into a house when he closes his eyes. He wakes up and there is a TV show going on, but he does not have a TV in his room. [name] notes that anxiety also started at that time. He gets panic attacks and sometimes may have the zoning out, occurring every couple of weeks. He may grip his knees but no clear dystonia or automatisms. It takes him about 1.5 minutes to recognize her. No associated tongue bite or incontinence. A few nights ago he woke up and did not remember where he was or [name]'s name. [name] has lived with him since she was born, he raised her. She started helping with medications a year ago. She states it was not due to memory problems but due to needing to get things together physically. He has started having problems remembering names. She took over finances a year ago. He does not drive. He gets agitated a lot because he used to be very independent and gets angry about it. He denies any headaches, dysarthria/dysphagia, focal numbness/tingling/weakness, myoclonic jerks. He get dizzy and takes meclizine for vertigo, but also has periods where he feels lightheaded/faint. This has gotten worse recently as well. No loss of consciousness. With macular degeneration, he can count fingers. He has neck pain and some constipation. He had a fall when he stood up and his foot caught on the wheelchair, bumping his head a little.   I personally reviewed imaging from 05/2019, last head CT showed resolution  of ICH including SAH and SDH. Head CT showed left parietal infarct.     Lab Results  Component Value Date   TSH 5.80 (H) 09/26/2020   Lab Results  Component Value Date   VITAMINB12 >1550 (H) 09/26/2020     PAST MEDICAL HISTORY: Past Medical History:  Diagnosis Date   AICD (automatic cardioverter/defibrillator) present 01/17/2003   Medtronic Maximo 7232CX ICD, serial #[id number]   Anemia 02-06-11   takes oral iron    Arthritis    hands, knees   CAD (coronary artery disease) 2003   a. h/o MI and CABG in 2003. b. s/p DES to SVG-RPDA-RPLB in 08/2014.   Cancer of sigmoid colon ([place]) 2012   a. s/p colon surgery.   Carotid bruit    Chronic systolic CHF (congestive heart failure) ([place])    a. EF 20% in 2014; b. 08/2017 Echo: EF 20-25%, diff HK, Gr3 DD. Triv AI. Mod MR. Sev dil LA. Mildly dil RV w/ mildly reduced RV fxn. Mildly dil RA. Mod TR. PASP 84mHg.   Cough    Exudative age-related macular degeneration of left eye with active choroidal neovascularization ([place]) 07/12/2019   Exudative age-related macular degeneration, right eye, with inactive choroidal neovascularization ([place]) 07/12/2019   GERD (gastroesophageal reflux disease) 02-06-11   HTN (hypertension)    Hyperlipidemia    Ischemic cardiomyopathy    a. EF 20% in 2014. (Master study EF >20%); b. 08/2017 Echo: EF 20-25%, diff HK. Gr3 DD.   LV (left ventricular) mural thrombus    a. 12/2012 Echo: EF 20% with mural thrombus No evidence of thrombus on 08/2017 echo.   Macular degeneration    Myocardial infarct ([place])    2003   PAF (paroxysmal atrial fibrillation) ([facility])    a. CHA2DS2VASc = 5-->Xarelto/Tikosyn.    PAST SURGICAL HISTORY: Past Surgical History:  Procedure Laterality Date   CARDIAC CATHETERIZATION N/A 09/20/2014   Procedure: Left Heart Cath and Coronary Angiography;  Surgeon: Jettie Booze, MD; LAD 95%, D1 100%, CFX liner percent, OM 200%, OM 390%, RCA 90%, LIMA-LAD okay, SVG-OM 2-OM 3 minimal disease, SVG-RPDA-RPLB 100% between the RPDA and RPL      CARDIAC CATHETERIZATION N/A 09/20/2014   Procedure: Coronary Stent Intervention;  Surgeon: Jettie Booze, MD; Synergy DES 4 x 24 mm reducing the stenosis to 5%    CARDIAC DEFIBRILLATOR PLACEMENT  01/17/03   6949 lead. Medtronic. remote-no; with later revision   CARDIOVERSION N/A 05/22/2016   Procedure: CARDIOVERSION;  Surgeon: Lelon Perla, MD;  Location: [facility] ENDOSCOPY;  Service:  Cardiovascular;  Laterality: N/A;   CARDIOVERSION N/A 08/10/2017   Procedure: CARDIOVERSION;  Surgeon: Pixie Casino, MD;  Location: [facility] ENDOSCOPY;  Service: Cardiovascular;  Laterality: N/A;   CATARACT EXTRACTION W/ INTRAOCULAR LENS  IMPLANT, BILATERAL Bilateral June/-July 2009   Dr. Katy Fitch   CATARACT EXTRACTION W/PHACO Bilateral 2010   Dr. Katy Fitch   COLON RESECTION  02/09/2011   Procedure: LAPAROSCOPIC SIGMOID COLON RESECTION;  Surgeon: Pedro Earls, MD;  Location: [facility] ORS;  Service: General;  Laterality: N/A;  Laparoscopic Assisted Sigmoid Colectomy   COLON SURGERY     COLONOSCOPY  08/31/2011   Procedure: COLONOSCOPY;  Surgeon: Jerene Bears, MD;  Location: [facility] ENDOSCOPY;  Service: Gastroenterology;  Laterality: N/A;   COLONOSCOPY N/A 09/05/2012   Procedure: COLONOSCOPY;  Surgeon: Jerene Bears, MD;  Location: [facility] ENDOSCOPY;  Service: Gastroenterology;  Laterality: N/A;   COLONOSCOPY N/A 04/18/2013   Procedure: COLONOSCOPY;  Surgeon: Jerene Bears, MD;  Location: [facility] ENDOSCOPY;  Service: Gastroenterology;  Laterality: N/A;   COLONOSCOPY N/A 04/09/2014   Procedure: COLONOSCOPY;  Surgeon: Jerene Bears, MD;  Location: [facility] ENDOSCOPY;  Service: Gastroenterology;  Laterality: N/A;   COLONOSCOPY WITH PROPOFOL N/A 05/03/2017   Procedure: COLONOSCOPY WITH PROPOFOL;  Surgeon: Jerene Bears, MD;  Location: [facility] ENDOSCOPY;  Service: Gastroenterology;  Laterality: N/A;   CORONARY ANGIOPLASTY     CORONARY ARTERY BYPASS GRAFT  01/2002   LIMA-LAD, SVG-OM 2-OM 3, SVG-RPDA-RPLB   CORONARY STENT INTERVENTION N/A 11/22/2018   Procedure: CORONARY STENT INTERVENTION;  Surgeon: Nelva Bush, MD;  Location: [place] CV LAB;  Service: Cardiovascular;  Laterality: N/A;  SVG - RCA   ICD GENERATOR CHANGE  2010   Medtronic Virtuoso II VR ICD   ICD GENERATOR CHANGEOUT N/A 12/07/2018   Procedure: ICD GENERATOR CHANGEOUT;  Surgeon: Deboraha Sprang, MD;  Location: [place] CV LAB;  Service: Cardiovascular;  Laterality: N/A;    INGUINAL HERNIA REPAIR Right 2000's X 2   LAPAROSCOPIC RIGHT HEMI COLECTOMY N/A 11/04/2012   Procedure: LAPAROSCOPIC RIGHT HEMI COLECTOMY;  Surgeon: Pedro Earls, MD;  Location: [facility] ORS;  Service: General;  Laterality: N/A;   LEFT HEART CATH AND CORS/GRAFTS ANGIOGRAPHY Left 11/22/2018   Procedure: LEFT HEART CATH AND CORS/GRAFTS ANGIOGRAPHY;  Surgeon: Nelva Bush, MD;  Location: [place] CV LAB;  Service: Cardiovascular;  Laterality: Left;   TONSILLECTOMY  ~ 1950    MEDICATIONS: Current Outpatient Medications on File Prior to Visit  Medication Sig Dispense Refill   acetaminophen (TYLENOL) 325 MG tablet Take 1-2 tablets (325-650 mg total) by mouth every 4 (four) hours as needed for mild pain.     ALPRAZolam (XANAX) 0.25 MG tablet TAKE 1 TABLET BY MOUTH 2 TIMES DAILY AS NEEDED FOR ANXIETY. 20 tablet 1   atorvastatin (LIPITOR) 80 MG tablet TAKE 1 TABLET BY MOUTH  DAILY 90 tablet 3   carvedilol (COREG) 6.25 MG tablet TAKE 1 TABLET BY MOUTH  TWICE DAILY WITH A MEAL 180 tablet 3   clopidogrel (PLAVIX) 75 MG tablet TAKE 1 TABLET BY MOUTH  DAILY 90 tablet 3   ezetimibe (ZETIA) 10 MG tablet TAKE 1 TABLET BY MOUTH  DAILY 30 tablet 0   furosemide (LASIX) 20 MG tablet Take 1 tablet (20 mg total) by mouth daily. 90 tablet 3   isosorbide mononitrate (IMDUR) 30 MG 24 hr tablet Take 1 tablet (30 mg total) by mouth daily. 90 tablet 3   losartan (COZAAR) 50 MG tablet TAKE 1 TABLET BY MOUTH  DAILY 90 tablet 3   meclizine (ANTIVERT) 25 MG tablet Take 1 tablet (25 mg total) by mouth daily as needed for dizziness. 30 tablet 2   mirtazapine (REMERON) 15 MG tablet Take 1 tablet (15 mg total) by mouth at bedtime. 90 tablet 0   pantoprazole (PROTONIX) 40 MG tablet TAKE 1 TABLET BY MOUTH  DAILY 90 tablet 3   polyethylene glycol (MIRALAX / GLYCOLAX) 17 g packet Take 17 g by mouth as needed.     potassium chloride (KLOR-CON) 10 MEQ tablet Take 1 tablet (10 mEq total) by mouth daily. 90 tablet 3    rivaroxaban (XARELTO) 20 MG TABS tablet Take 1 tablet (20 mg total) by mouth daily with supper. 90 tablet 3   vitamin B-12 (CYANOCOBALAMIN) 100 MCG tablet Take 100 mcg by mouth daily.      nitroGLYCERIN (NITROSTAT) 0.4 MG SL tablet DISSOLVE 1 TABLET UNDER THE TONGUE EVERY 5 MINUTES AS  NEEDED  FOR CHEST PAIN. MAX  OF 3 TABLETS IN 15 MINUTES. CALL 911 IF PAIN PERSISTS. (Patient not taking: Reported on 10/11/2020) 100 tablet 3   No current facility-administered medications on file prior to visit.    ALLERGIES: Allergies  Allergen Reactions   Buspar [Buspirone] Other (See Comments)    Fatigue, body aches and shortness of breath    Latex Rash   Tape Rash and Other (See Comments)    USE PAPER    FAMILY HISTORY: Family History  Problem Relation Age of Onset   Hypertension Father    Hyperlipidemia Father    Heart disease Father    Prostate cancer Father    Alzheimer's disease Mother    Hypertension Sister    Hyperlipidemia Sister    Colon cancer Neg Hx    Esophageal cancer Neg Hx    Rectal cancer Neg Hx    Stomach cancer Neg Hx     SOCIAL HISTORY: Social History   Socioeconomic History   Marital status: Widowed    Spouse name: Not on file   Number of children: 3   Years of education: Not on file   Highest education level: Not on file  Occupational History   Occupation: self employed/ Art gallery manager  Tobacco Use   Smoking status: Never   Smokeless tobacco: Never  Vaping Use   Vaping Use: Never used  Substance and Sexual Activity   Alcohol use: Yes    Alcohol/week: 2.0 standard drinks    Types: 2 Cans of beer per week   Drug use: No   Sexual activity: Not Currently  Other Topics Concern   Not on file  Social History Narrative   Full time. Married.    Right handed   Drinks caffeine   Two story home   Social Determinants of Health   Financial Resource Strain: Low Risk    Difficulty of Paying Living Expenses: Not hard at all  Food Insecurity: No Food Insecurity   Worried  About [name] fundraiser in the Last Year: Never true   Arboriculturist in the Last Year: Never true  Transportation Needs: No Transportation Needs   Lack of Transportation (Medical): No   Lack of Transportation (Non-Medical): No  Physical Activity: Insufficiently Active   Days of Exercise per Week: 2 days   Minutes of Exercise per Session: 60 min  Stress: No Stress Concern Present   Feeling of Stress : Not at all  Social Connections: Not on file  Intimate Partner Violence: Not on file     PHYSICAL EXAM: Vitals:   10/11/20 1312  BP: (!) 96/55  Pulse: 85  SpO2: 99%   General: No acute distress Head:  Normocephalic/atraumatic Skin/Extremities: No rash, no edema Neurological Exam: Mental status: alert and oriented to person, place, and time, no dysarthria or aphasia, Fund of knowledge is appropriate.  Attention and concentration are normal.  Cranial nerves: CN I: not tested CN II: pupils equal, round and reactive to light, visual fields intact CN III, IV, VI:  full range of motion, no nystagmus, no ptosis CN V: decreased cold, pin on right V1-3 CN VII: upper and lower face symmetric CN VIII: hearing intact to conversation Bulk & Tone: increased tone on right, no fasciculations. Motor:  right hemiparesis, 5/5 on left.  Sensation: decreased to all modalities on right Deep Tendon Reflexes: brisk +3 on right UE and LE, +2 on left Cerebellar: no incoordination on finger to nose, heel to shin. No dysdiadochokinesia Gait: not  tested Tremor: none   IMPRESSION: This is a 80 year old right-handed man with a history of atrial fibrillation on Xarelto, s/p ICD placement, hypertension, colon cancer, left MCA stroke with traumatic left vertex SDH in March 2021 with residual right hemiparesis presenting for evaluation of possible seizures with recurrent episodes of unresponsiveness. We discussed how old stroke can be a risk factor for seizure. He has had visual hallucinations since a fall,  head CT without contrast will be ordered to assess for underlying structural abnormality. Schedule 1-hour EEG. He does not drive. Follow-up after tests, call for any changes.    Thank you for allowing me to participate in the care of this patient. Please do not hesitate to call for any questions or concerns.   Ellouise Newer, M.D.  CC: Dr. Sharlet Salina

## 2020-10-14 ENCOUNTER — Ambulatory Visit (INDEPENDENT_AMBULATORY_CARE_PROVIDER_SITE_OTHER): Payer: Medicare Other

## 2020-10-14 ENCOUNTER — Other Ambulatory Visit: Payer: Self-pay | Admitting: Internal Medicine

## 2020-10-14 DIAGNOSIS — Z95810 Presence of automatic (implantable) cardiac defibrillator: Secondary | ICD-10-CM | POA: Diagnosis not present

## 2020-10-14 DIAGNOSIS — I5022 Chronic systolic (congestive) heart failure: Secondary | ICD-10-CM | POA: Diagnosis not present

## 2020-10-14 NOTE — Progress Notes (Signed)
EPIC Encounter for ICM Monitoring  Patient Name: Mark Clayton is a 80 y.o. male Date: 10/14/2020 Primary Care Physican: [name], MD Cardiologist: Marlou Porch Electrophysiologist: Caryl Comes 10/14/2020 Weight: 130-133 lbs   Time in AF:  24.0 hr/day (100.0%) (taking Xarelto)          Spoke with patient and reports swelling of his feet that comes and goes but no worse than ususal. Heart failure questions reviewed.          Optivol Thoracic impedance suggesting possible ongoing fluid accumulation pattern continues since 4/4.   Prescribed:  Furosemide 20 mg 1 tablet by mouth daily. Potassium 10 mEq take 1 tablet daily   Labs: 10/08/2020 Creatinine 1.22, BUN 21, Potassium 3.4, Sodium 139, GFR 56.26 06/26/2020 Creatinine 1.33, BUN 24, Potassium 4.1, Sodium 139, GFR 50.73 07/16/2020 Creatinine 1.05, BUN 14, Potassium 3.6, Sodium 139, GFR 67.47 07/05/2020 Creatinine 1.01, BUN 12, Potassium 4.3, Sodium 128, GFR 70.70 05/23/2020 Creatinine 0.96, BUN 13, Potassium 4.4, Sodium 134, GFR 75.21  A complete set of results can be found in Results Review.   Recommendations:  Granddaughter gives patient extra Furosemide when he has swelling if needed.  No changes and encouraged to call if experiencing any fluid symptoms.   Follow-up plan: ICM clinic phone appointment on 11/18/2020.  91 day device clinic remote transmission 10/23/2020.     EP/Cardiology Office Visits: Recall 01/26/2021 with Dr Marlou Porch.  Recall for 10/12/2020 with Dr. Caryl Comes.     Copy of ICM check sent to Dr. Caryl Comes and Dr Marlou Porch as Juluis Rainier.  3 month ICM trend: 10/14/2020.    1 Year ICM trend:       Rosalene Billings, RN 10/14/2020 12:15 PM

## 2020-10-15 DIAGNOSIS — Z950 Presence of cardiac pacemaker: Secondary | ICD-10-CM | POA: Diagnosis not present

## 2020-10-15 DIAGNOSIS — I69351 Hemiplegia and hemiparesis following cerebral infarction affecting right dominant side: Secondary | ICD-10-CM | POA: Diagnosis not present

## 2020-10-15 DIAGNOSIS — Z7902 Long term (current) use of antithrombotics/antiplatelets: Secondary | ICD-10-CM | POA: Diagnosis not present

## 2020-10-15 DIAGNOSIS — I5022 Chronic systolic (congestive) heart failure: Secondary | ICD-10-CM | POA: Diagnosis not present

## 2020-10-15 DIAGNOSIS — Z7901 Long term (current) use of anticoagulants: Secondary | ICD-10-CM | POA: Diagnosis not present

## 2020-10-15 DIAGNOSIS — M21371 Foot drop, right foot: Secondary | ICD-10-CM | POA: Diagnosis not present

## 2020-10-15 NOTE — Telephone Encounter (Signed)
Pt had lab work 7/12 which demonstrated K+ 3.4 -    Levels are good no changes needed.  Written by [name], MD on 10/09/2020 12:34 PM EDT    Lab was to follow up after Furosemide had been increased to 20 mg BID X 3 days then return to Furosemide 20 mg daily.  Pt takes potassium chloride 10 mEq daily according to documentation in the chart.   Pt had remote device check 10/14/20 as documented by Sharman Cheek, RN demonstrating possible ongoing fluid accumulation pattern since 4/4.  Will forward to Dr Marlou Porch for his knowledge and any new orders.

## 2020-10-16 ENCOUNTER — Telehealth: Payer: Self-pay | Admitting: Pharmacist

## 2020-10-16 NOTE — Progress Notes (Signed)
Chronic Care Management Pharmacy Assistant   Name: Mark Clayton  MRN: [id number] DOB: 09-04-1940   Reason for Encounter: Disease State   Conditions to be addressed/monitored: General Call   Recent office visits:  05/23/20 Dr. Sharlet Salina Internal Medicine (Hx of complete Stroke)  07/16/20 Dr. Sharlet Salina Hemiplegia of right dominant side as late effect of cerebral infarction, unspecified hemiplegia type ([place])  09/26/20 Dr. Sharlet Salina Focal neurological deficit, onset greater than 24 hours Confusion We will get a CT scan of the head to check and get you in with a neurologist to check for seizure. We are getting labs and chest x-ray today. We will have you stop the mirtazepine to see if this is causing some of the problems.  10/08/20 Dr. Sharlet Salina Bilateral recurrent inguinal hernia without obstruction or gangrene   Recent consult visits:  04/30/20, 07/31/20 and 08/22/20 Dr. Deloria Lair Ophthalmology Exudative age-related macular degeneration of right eye with active choroidal neovascularization ([place]) Medication - Aflibercept 2 mg  07/05/20 DR. Cathlean Cower Internal Medicine  Low urine output Hypotension, unspecified hypotension type. Will check bmp but suspect mild low volume; pt to hold on lasix scheduled again tomorrow, and restart Mon apr 11.  Will need f/u with PCP early next wk as well, or sooner with even worsening lower urine output and/or BP or orthostatic symptoms.  07/30/20 Dr. Candee Furbish Cardiology Chronic systolic heart failure [facility]) Persistent atrial fibrillation ([place])  08/16/20 Dr. Sherene Sires Sports MedicineCervical radiculitis Acute pain of left shoulder. No medication changes  10/11/20 Dr. Ellouise Newer Neurology -Visual hallucinations- no medication changes  Hospital visits:  None in previous 6 months  Medications: Outpatient Encounter Medications as of 10/16/2020  Medication Sig   acetaminophen (TYLENOL) 325 MG tablet Take 1-2 tablets (325-650 mg total) by mouth every 4  (four) hours as needed for mild pain.   ALPRAZolam (XANAX) 0.25 MG tablet TAKE 1 TABLET BY MOUTH TWICE A DAY AS NEEDED FOR ANXIETY   atorvastatin (LIPITOR) 80 MG tablet TAKE 1 TABLET BY MOUTH  DAILY   carvedilol (COREG) 6.25 MG tablet TAKE 1 TABLET BY MOUTH  TWICE DAILY WITH A MEAL   clopidogrel (PLAVIX) 75 MG tablet TAKE 1 TABLET BY MOUTH  DAILY   ezetimibe (ZETIA) 10 MG tablet TAKE 1 TABLET BY MOUTH  DAILY   furosemide (LASIX) 20 MG tablet Take 1 tablet (20 mg total) by mouth daily.   isosorbide mononitrate (IMDUR) 30 MG 24 hr tablet Take 1 tablet (30 mg total) by mouth daily.   losartan (COZAAR) 50 MG tablet TAKE 1 TABLET BY MOUTH  DAILY   meclizine (ANTIVERT) 25 MG tablet Take 1 tablet (25 mg total) by mouth daily as needed for dizziness.   mirtazapine (REMERON) 15 MG tablet Take 1 tablet (15 mg total) by mouth at bedtime. (Patient not taking: Reported on 10/11/2020)   nitroGLYCERIN (NITROSTAT) 0.4 MG SL tablet DISSOLVE 1 TABLET UNDER THE TONGUE EVERY 5 MINUTES AS  NEEDED FOR CHEST PAIN. MAX  OF 3 TABLETS IN 15 MINUTES. CALL 911 IF PAIN PERSISTS. (Patient not taking: Reported on 10/11/2020)   pantoprazole (PROTONIX) 40 MG tablet TAKE 1 TABLET BY MOUTH  DAILY   polyethylene glycol (MIRALAX / GLYCOLAX) 17 g packet Take 17 g by mouth as needed.   potassium chloride (KLOR-CON) 10 MEQ tablet Take 1 tablet (10 mEq total) by mouth daily.   rivaroxaban (XARELTO) 20 MG TABS tablet Take 1 tablet (20 mg total) by mouth daily with supper.   vitamin  B-12 (CYANOCOBALAMIN) 100 MCG tablet Take 100 mcg by mouth daily.    No facility-administered encounter medications on file as of 10/16/2020.    Pharmacist Review  Have you had any problems recently with your health? Patient states that he is doing fine. He states that he is not really having any new issues at the time. Patient states that he is having some memory issues and is seeing a neurologist for that issue. Patient stated that he has not had any major  changes in his medications, he states that is he taking alprazolam as needed  Have you had any problems with your pharmacy? Patient states that he does not have any issues with getting his medication or the cost of medications from the pharmacy  What issues or side effects are you having with your medications? Patient states that he is not having any side effects from medications  What would you like me to pass along to [facility], CPP for them to help you with?  Patient states that he is doing well and that Dr. Sharlet Salina is up to date on his health. You can tell Mendel Ryder I am doing fine  What can we do to take care of you better?  Patient states that for now he is good  Star Rating Drugs: Losartan 50 mg 08/15/20 90 ds Atorvastatin 80 mg 08/22/20 90 ds  Ethelene Hal Clinical Pharmacist Assistant [phone]   Time spent:60

## 2020-10-17 ENCOUNTER — Other Ambulatory Visit: Payer: Medicare Other

## 2020-10-23 ENCOUNTER — Ambulatory Visit (INDEPENDENT_AMBULATORY_CARE_PROVIDER_SITE_OTHER): Payer: Medicare Other

## 2020-10-23 DIAGNOSIS — I5022 Chronic systolic (congestive) heart failure: Secondary | ICD-10-CM

## 2020-10-23 LAB — CUP PACEART REMOTE DEVICE CHECK
Battery Remaining Longevity: 121 mo
Battery Voltage: 3.02 V
Brady Statistic RV Percent Paced: 0.11 %
Date Time Interrogation Session: 20220727043624
HighPow Impedance: 34 Ohm
HighPow Impedance: 44 Ohm
Implantable Lead Implant Date: 20041020
Implantable Lead Implant Date: 20100913
Implantable Lead Location: 753860
Implantable Lead Location: 753860
Implantable Lead Model: 5076
Implantable Lead Model: 6949
Implantable Pulse Generator Implant Date: 20200909
Lead Channel Impedance Value: 285 Ohm
Lead Channel Impedance Value: 399 Ohm
Lead Channel Pacing Threshold Amplitude: 1.5 V
Lead Channel Pacing Threshold Pulse Width: 0.4 ms
Lead Channel Sensing Intrinsic Amplitude: 6.75 mV
Lead Channel Sensing Intrinsic Amplitude: 6.75 mV
Lead Channel Setting Pacing Amplitude: 2.5 V
Lead Channel Setting Pacing Pulse Width: 0.8 ms
Lead Channel Setting Sensing Sensitivity: 0.45 mV

## 2020-10-24 ENCOUNTER — Ambulatory Visit: Payer: Medicare Other | Admitting: Neurology

## 2020-10-24 ENCOUNTER — Telehealth (INDEPENDENT_AMBULATORY_CARE_PROVIDER_SITE_OTHER): Payer: Medicare Other | Admitting: Internal Medicine

## 2020-10-24 ENCOUNTER — Encounter: Payer: Self-pay | Admitting: Internal Medicine

## 2020-10-24 ENCOUNTER — Other Ambulatory Visit: Payer: Self-pay

## 2020-10-24 DIAGNOSIS — R441 Visual hallucinations: Secondary | ICD-10-CM

## 2020-10-24 DIAGNOSIS — Z8673 Personal history of transient ischemic attack (TIA), and cerebral infarction without residual deficits: Secondary | ICD-10-CM

## 2020-10-24 DIAGNOSIS — I5022 Chronic systolic (congestive) heart failure: Secondary | ICD-10-CM

## 2020-10-24 DIAGNOSIS — R404 Transient alteration of awareness: Secondary | ICD-10-CM

## 2020-10-24 DIAGNOSIS — R053 Chronic cough: Secondary | ICD-10-CM | POA: Diagnosis not present

## 2020-10-24 MED ORDER — PROMETHAZINE-DM 6.25-15 MG/5ML PO SYRP: 5.0000 mL | ORAL_SOLUTION | Freq: Four times a day (QID) | ORAL | 0 refills | Status: DC | PRN

## 2020-10-24 MED ORDER — POTASSIUM CHLORIDE CRYS ER 10 MEQ PO TBCR: 20.0000 meq | EXTENDED_RELEASE_TABLET | Freq: Every day | ORAL | 3 refills | Status: DC

## 2020-10-24 NOTE — Telephone Encounter (Signed)
Spoke with [facility] and made her aware to have pt take [place] 58mq daily and plan for labs next week.  Pt will come for labs on 8/4.  [name] verbalized understanding and was in agreement with plan.    Pt did not take Potassium as directed this morning as they realized that they no longer had any at home.  Advised I will send prescription to pharmacy.

## 2020-10-24 NOTE — Progress Notes (Signed)
Virtual Visit via Audio Note  I connected with Mark Clayton on 10/24/20 at  8:00 AM EDT by an audio-only enabled telemedicine application and verified that I am speaking with the correct person using two identifiers.  The patient and the provider were at separate locations throughout the entire encounter. Patient location: home, Provider location: work   I discussed the limitations of evaluation and management by telemedicine and the availability of in person appointments. The patient expressed understanding and agreed to proceed. The patient and the provider were the only parties present for the visit unless noted in HPI below.  History of Present Illness: The patient is a 80 y.o. man with visit for coughing a lot last night. Denies SOB. Some SOB on exertion. Coughing worse with lying flat. Started within the last few days. Has been taking 2 fluid pills daily. Denies fevers or chills. Overall it is not improving. Home covid-19 test negative. Has tried nothing. Denies sinus congestion or drainage. Some history provided by granddaughter.   Observations/Objective: A and O times 3, no coughing during visit and no dyspnea  Assessment and Plan: See problem oriented charting  Follow Up Instructions: Ordered CXR and promethazine/dm cough syrup  Visit time 11 minutes in non-face to face communication with patient and coordination of care.  I discussed the assessment and treatment plan with the patient. The patient was provided an opportunity to ask questions and all were answered. The patient agreed with the plan and demonstrated an understanding of the instructions.   The patient was advised to call back or seek an in-person evaluation if the symptoms worsen or if the condition fails to improve as anticipated.  [name], MD

## 2020-10-24 NOTE — Telephone Encounter (Signed)
Agree with KCL  Check BMET in one week  Candee Furbish, MD    ----- Message -----  From: Loren Racer, RN  Sent: 10/24/2020  10:10 AM EDT  To: Jerline Pain, MD, Shellia Cleverly, RN    Called and pt's friend answered the phone.  Pt currently having EEG performed.  Advised to have pt or [name] give our office a call back.

## 2020-10-24 NOTE — Assessment & Plan Note (Signed)
They deny weight gain but worsening cough especially with lying down. Ordered CXR to assess his chronic effusion. Taking 2 pills lasix 20 mg for total 40 mg lasix daily.

## 2020-10-24 NOTE — Telephone Encounter (Signed)
Spoke with pt and he had me speak with his granddaughter that controls his medicaitons [facility]).  She states pt has been taking Furosemide '20mg'$  BID since speaking with Jeannene Patella and Margarita Grizzle earlier this month.  Inquired about dose of KDUR and she states that pt does not take this at all because one of his doctors told him he didn't need it because he is on another medication that is holding his K+ up (Losartan).  Advised this is not correct because his K+ was just a little low on his last labs on 7/12.  Advised to have pt to take K+ 61mq now and I am going to send a message to Dr. SMarlou Porchfor review because pt likely needs repeat labs so we can see where his K+ level is now.

## 2020-10-24 NOTE — Telephone Encounter (Signed)
Patient returning call.

## 2020-10-24 NOTE — Assessment & Plan Note (Signed)
He does have chronic effusion so this may be related to that. He is not on ACE-I. Concern for CAP so ordered CXR given that cough is going on several weeks versus CHF exacerbation although they deny weight gain.

## 2020-10-26 ENCOUNTER — Inpatient Hospital Stay: Admission: RE | Admit: 2020-10-26 | Payer: Medicare Other | Source: Ambulatory Visit

## 2020-10-28 ENCOUNTER — Telehealth: Payer: Self-pay | Admitting: Internal Medicine

## 2020-10-28 NOTE — Telephone Encounter (Signed)
Fine for orders

## 2020-10-28 NOTE — Telephone Encounter (Signed)
    [place] Name: [facility] Agency Name: Moreen Fowler Phone #: [phone] Service Requested: PT Frequency of Visits: 1W1, starting 7/31

## 2020-10-28 NOTE — Telephone Encounter (Signed)
See below

## 2020-10-29 ENCOUNTER — Ambulatory Visit
Admission: RE | Admit: 2020-10-29 | Discharge: 2020-10-29 | Disposition: A | Discharge status: Home / Self Care | Payer: Medicare Other | Source: Ambulatory Visit | Attending: Internal Medicine | Admitting: Internal Medicine

## 2020-10-29 ENCOUNTER — Other Ambulatory Visit: Payer: Self-pay

## 2020-10-29 DIAGNOSIS — I639 Cerebral infarction, unspecified: Secondary | ICD-10-CM | POA: Diagnosis not present

## 2020-10-29 DIAGNOSIS — G9389 Other specified disorders of brain: Secondary | ICD-10-CM | POA: Diagnosis not present

## 2020-10-29 DIAGNOSIS — R410 Disorientation, unspecified: Secondary | ICD-10-CM

## 2020-10-29 NOTE — Telephone Encounter (Signed)
Verbal orders given  

## 2020-10-31 ENCOUNTER — Telehealth: Payer: Self-pay

## 2020-10-31 ENCOUNTER — Other Ambulatory Visit: Payer: Medicare Other

## 2020-10-31 ENCOUNTER — Other Ambulatory Visit: Payer: Self-pay

## 2020-10-31 ENCOUNTER — Other Ambulatory Visit: Payer: Self-pay | Admitting: Student

## 2020-10-31 DIAGNOSIS — I5022 Chronic systolic (congestive) heart failure: Secondary | ICD-10-CM | POA: Diagnosis not present

## 2020-10-31 NOTE — Progress Notes (Addendum)
Electrophysiology Office Note Date: 11/01/2020  ID:  Mark Clayton, Mark Clayton 06-12-1940, MRN [id number]  PCP: [name], MD Primary Cardiologist: Candee Furbish, MD Electrophysiologist: Virl Axe, MD   CC: ICD therapies  MAKOA Clayton is a 80 y.o. male seen today for Virl Axe, MD for acute visit due to ICD therapies .    Remote alert received 10/31/2020 for ICD shock for "VT that fell into the VF zone".   Pt presents today for evaluation. Pt was in the middle of a "panic attack" when it occurred. No syncope. Reports med compliance. Granddaughter here today who cares for him, but is not POA.  Gradual decline over past several months. By optivol, worsening of HF since April. He takes lasix 40 mg q am and 20 mg q pm. Better UOP on 40 dose. No syncope.  Denies chest pain or worsening SOB. Not very active.   Device History: MDTsingle chamber ICD,  original implant [id number] lead was 2004), gen change and new RV lead, 12/10/08 : Gen change 11/2018  Past Medical History:  Diagnosis Date   AICD (automatic cardioverter/defibrillator) present 01/17/2003   Medtronic Maximo 7232CX ICD, serial [id number]   Anemia 02-06-11   takes oral iron   Arthritis    hands, knees   CAD (coronary artery disease) 2003   a. h/o MI and CABG in 2003. b. s/p DES to SVG-RPDA-RPLB in 08/2014.   Cancer of sigmoid colon ([place]) 2012   a. s/p colon surgery.   Carotid bruit    Chronic systolic CHF (congestive heart failure) ([place])    a. EF 20% in 2014; b. 08/2017 Echo: EF 20-25%, diff HK, Gr3 DD. Triv AI. Mod MR. Sev dil LA. Mildly dil RV w/ mildly reduced RV fxn. Mildly dil RA. Mod TR. PASP 80mg.   Cough    Exudative age-related macular degeneration of left eye with active choroidal neovascularization ([place] 07/12/2019   Exudative age-related macular degeneration, right eye, with inactive choroidal neovascularization ([place] 07/12/2019   GERD (gastroesophageal reflux disease) 02-06-11   HTN (hypertension)     Hyperlipidemia    Ischemic cardiomyopathy    a. EF 20% in 2014. (Master study EF >20%); b. 08/2017 Echo: EF 20-25%, diff HK. Gr3 DD.   LV (left ventricular) mural thrombus    a. 12/2012 Echo: EF 20% with mural thrombus No evidence of thrombus on 08/2017 echo.   Macular degeneration    Myocardial infarct ([place]    2003   PAF (paroxysmal atrial fibrillation) ([facility])    a. CHA2DS2VASc = 5-->Xarelto/Tikosyn.   Past Surgical History:  Procedure Laterality Date   CARDIAC CATHETERIZATION N/A 09/20/2014   Procedure: Left Heart Cath and Coronary Angiography;  Surgeon: JJettie Booze MD; LAD 95%, D1 100%, CFX liner percent, OM 200%, OM 390%, RCA 90%, LIMA-LAD okay, SVG-OM 2-OM 3 minimal disease, SVG-RPDA-RPLB 100% between the RPDA and RPL      CARDIAC CATHETERIZATION N/A 09/20/2014   Procedure: Coronary Stent Intervention;  Surgeon: JJettie Booze MD; Synergy DES 4 x 24 mm reducing the stenosis to 5%    CARDIAC DEFIBRILLATOR PLACEMENT  01/17/03   6949 lead. Medtronic. remote-no; with later revision   CARDIOVERSION N/A 05/22/2016   Procedure: CARDIOVERSION;  Surgeon: BLelon Perla MD;  [place] [facility]  Service: Cardiovascular;  Laterality: N/A;   CARDIOVERSION N/A 08/10/2017   Procedure: CARDIOVERSION;  Surgeon: HPixie Casino MD;  [place] [facility]  Service: Cardiovascular;  Laterality: N/A;   CATARACT EXTRACTION W/ INTRAOCULAR  LENS  IMPLANT, BILATERAL Bilateral June/-July 2009   Dr. Katy Fitch   CATARACT EXTRACTION W/PHACO Bilateral 2010   Dr. Katy Fitch   COLON RESECTION  02/09/2011   Procedure: LAPAROSCOPIC SIGMOID COLON RESECTION;  Surgeon: Pedro Earls, MD;  [facility];  Service: General;  Laterality: N/A;  Laparoscopic Assisted Sigmoid Colectomy   COLON SURGERY     COLONOSCOPY  08/31/2011   Procedure: COLONOSCOPY;  Surgeon: Jerene Bears, MD;  [facility];  Service: Gastroenterology;  Laterality: N/A;   COLONOSCOPY N/A 09/05/2012   Procedure: COLONOSCOPY;   Surgeon: Jerene Bears, MD;  [facility];  Service: Gastroenterology;  Laterality: N/A;   COLONOSCOPY N/A 04/18/2013   Procedure: COLONOSCOPY;  Surgeon: Jerene Bears, MD;  [facility];  Service: Gastroenterology;  Laterality: N/A;   COLONOSCOPY N/A 04/09/2014   Procedure: COLONOSCOPY;  Surgeon: Jerene Bears, MD;  [facility];  Service: Gastroenterology;  Laterality: N/A;   COLONOSCOPY WITH PROPOFOL N/A 05/03/2017   Procedure: COLONOSCOPY WITH PROPOFOL;  Surgeon: Jerene Bears, MD;  [facility];  Service: Gastroenterology;  Laterality: N/A;   CORONARY ANGIOPLASTY     CORONARY ARTERY BYPASS GRAFT  01/2002   LIMA-LAD, SVG-OM 2-OM 3, SVG-RPDA-RPLB   CORONARY STENT INTERVENTION N/A 11/22/2018   Procedure: CORONARY STENT INTERVENTION;  Surgeon: Nelva Bush, MD;  Location: [place] [facility];  Service: Cardiovascular;  Laterality: N/A;  SVG - RCA   ICD GENERATOR CHANGE  2010   Medtronic Virtuoso II VR ICD   ICD GENERATOR CHANGEOUT N/A 12/07/2018   Procedure: ICD GENERATOR CHANGEOUT;  Surgeon: Deboraha Sprang, MD;  Location: [place] [facility];  Service: Cardiovascular;  Laterality: N/A;   INGUINAL HERNIA REPAIR Right 2000's X 2   LAPAROSCOPIC RIGHT HEMI COLECTOMY N/A 11/04/2012   Procedure: LAPAROSCOPIC RIGHT HEMI COLECTOMY;  Surgeon: Pedro Earls, MD;  [facility];  Service: General;  Laterality: N/A;   LEFT HEART CATH AND CORS/GRAFTS ANGIOGRAPHY Left 11/22/2018   Procedure: LEFT HEART CATH AND CORS/GRAFTS ANGIOGRAPHY;  Surgeon: Nelva Bush, MD;  Location: [place] [facility];  Service: Cardiovascular;  Laterality: Left;   TONSILLECTOMY  ~ 1950    Current Outpatient Medications  Medication Sig Dispense Refill   acetaminophen (TYLENOL) 325 MG tablet Take 1-2 tablets (325-650 mg total) by mouth every 4 (four) hours as needed for mild pain.     ALPRAZolam (XANAX) 0.25 MG tablet TAKE 1 TABLET BY MOUTH TWICE A DAY AS NEEDED FOR ANXIETY 20 tablet  1   atorvastatin (LIPITOR) 80 MG tablet TAKE 1 TABLET BY MOUTH  DAILY 90 tablet 3   carvedilol (COREG) 6.25 MG tablet TAKE 1 TABLET BY MOUTH  TWICE DAILY WITH A MEAL 180 tablet 3   clopidogrel (PLAVIX) 75 MG tablet TAKE 1 TABLET BY MOUTH  DAILY 90 tablet 3   ezetimibe (ZETIA) 10 MG tablet TAKE 1 TABLET BY MOUTH  DAILY 30 tablet 0   isosorbide mononitrate (IMDUR) 30 MG 24 hr tablet Take 1 tablet (30 mg total) by mouth daily. 90 tablet 3   losartan (COZAAR) 50 MG tablet TAKE 1 TABLET BY MOUTH  DAILY 90 tablet 3   meclizine (ANTIVERT) 25 MG tablet Take 1 tablet (25 mg total) by mouth daily as needed for dizziness. 30 tablet 2   nitroGLYCERIN (NITROSTAT) 0.4 MG SL tablet DISSOLVE 1 TABLET UNDER THE TONGUE EVERY 5 MINUTES AS  NEEDED FOR CHEST PAIN. MAX  OF 3 TABLETS IN 15 MINUTES. CALL  911 IF PAIN PERSISTS. 100 tablet 3   pantoprazole (PROTONIX) 40 MG tablet TAKE 1 TABLET BY MOUTH  DAILY 90 tablet 3   polyethylene glycol (MIRALAX / GLYCOLAX) 17 g packet Take 17 g by mouth as needed.     promethazine-dextromethorphan (PROMETHAZINE-DM) 6.25-15 MG/5ML syrup Take 5 mLs by mouth 4 (four) times daily as needed for cough. 118 mL 0   rivaroxaban (XARELTO) 20 MG TABS tablet Take 1 tablet (20 mg total) by mouth daily with supper. 90 tablet 3   vitamin B-12 (CYANOCOBALAMIN) 100 MCG tablet Take 100 mcg by mouth daily.      furosemide (LASIX) 40 MG tablet Take '60mg'$  twice daily for 3 days then take '40mg'$  twice daily 70 tablet 3   potassium chloride (KLOR-CON) 20 MEQ tablet Take 23mq twice daily for 3 days then take 238m daily 70 tablet 3   No current facility-administered medications for this visit.    Allergies:   Buspar [buspirone], Latex, and Tape   Social History: Social History   Socioeconomic History   Marital status: Widowed    Spouse name: Not on file   Number of children: 3   Years of education: Not on file   Highest education level: Not on file  Occupational History   Occupation: self  employed/ BaArt gallery managerTobacco Use   Smoking status: Never   Smokeless tobacco: Never  Vaping Use   Vaping Use: Never used  Substance and Sexual Activity   Alcohol use: Yes    Alcohol/week: 2.0 standard drinks    Types: 2 Cans of beer per week   Drug use: No   Sexual activity: Not Currently  Other Topics Concern   Not on file  Social History Narrative   Full time. Married.    Right handed   Drinks caffeine   Two story home   Social Determinants of Health   Financial Resource Strain: Low Risk    Difficulty of Paying Living Expenses: Not hard at all  Food Insecurity: No Food Insecurity   Worried About RuCharity fundraisern the Last Year: Never true   RaArboriculturistn the Last Year: Never true  Transportation Needs: No Transportation Needs   Lack of Transportation (Medical): No   Lack of Transportation (Non-Medical): No  Physical Activity: Insufficiently Active   Days of Exercise per Week: 2 days   Minutes of Exercise per Session: 60 min  Stress: No Stress Concern Present   Feeling of Stress : Not at all  Social Connections: Not on file  Intimate Partner Violence: Not on file    Family History: Family History  Problem Relation Age of Onset   Hypertension Father    Hyperlipidemia Father    Heart disease Father    Prostate cancer Father    Alzheimer's disease Mother    Hypertension Sister    Hyperlipidemia Sister    Colon cancer Neg Hx    Esophageal cancer Neg Hx    Rectal cancer Neg Hx    Stomach cancer Neg Hx     Review of Systems: All other systems reviewed and are otherwise negative except as noted above.   Physical Exam: Vitals:   11/01/20 1206  BP: 104/64  Pulse: 82  Weight: 133 lb (60.3 kg)  Height: '5\' 9"'$  (1.753 Mark)     GEN- The patient is well appearing, alert and oriented x 3 today.   HEENT: normocephalic, atraumatic; sclera clear, conjunctiva pink; hearing intact; oropharynx clear; neck supple, no  JVP Lymph- no cervical lymphadenopathy Lungs-  Clear to ausculation bilaterally, normal work of breathing.  No wheezes, rales, rhonchi Heart- Regular rate and rhythm, no murmurs, rubs or gallops, PMI not laterally displaced GI- soft, non-tender, non-distended, bowel sounds present, no hepatosplenomegaly Extremities- no clubbing or cyanosis. No edema; DP/PT/radial pulses 2+ bilaterally MS- no significant deformity or atrophy Skin- warm and dry, no rash or lesion; ICD pocket well healed Psych- euthymic mood, full affect Neuro- strength and sensation are intact  ICD interrogation- reviewed in detail today,  See PACEART report  EKG:  EKG is ordered today. Personal review of ekg ordered today shows NSR at 81 bpm with a PVC  Recent Labs: 05/23/2020: Magnesium 2.0 10/31/2020: ALT 16; BUN 20; Creatinine, Ser 1.43; Hemoglobin 9.3; Platelets 195; Potassium 4.7; Sodium 131; TSH 12.000   Wt Readings from Last 3 Encounters:  11/01/20 133 lb (60.3 kg)  10/11/20 133 lb (60.3 kg)  09/26/20 134 lb 3.2 oz (60.9 kg)     Other studies Reviewed: Additional studies/ records that were reviewed today include: Previous EP office notes   Assessment and Plan:  Ischemic cardiomyopathy   Implantable defibrillator-Medtronic-single chamber-    Hypertension   IVCD   Atrial fibrillation persistent   Acute on chronic Congestive heart failure   PVCs   Bradycardia  Echo 06/07/2019 <20%. I suspect it is same or worse. Patient is markedly volume overloaded and in florid heart failure today. He has soft pitting edema up past his flanks and JVP to his jaw.  I have recommend admission the hospital for diuresis and close lab follow up. He refuses. He says he had such a bad experience when he was admitted with a stroke he wants to avoid hospital at all time.  Labwork from yesterday reviewed. Cr mildly elevated, K OK. TSH quiet high, but Free T4 WNL.  He is not sure about advanced directives. He wants his device to shock him at least once more, but does not  want CPR. Encouraged he and family to find his paperwork at home and keep it on hand. They should fill out forms if they are unable to find.   ? Updating ischemic work up. No overt ischemic symptoms. I discussed with patient considering Myoview, which would potentially lead to cath. Right now, he is markedly volume overloaded and wishes to defer procedures.  At this point, I do not expect this would change his poor prognosis.   Will continue BB but will not uptitrate in setting of acute decompensation.   He has previously failed amiodarone due to weakness and SOB.   I discussed personally with the patient that he is at an incredibly high risk of decompensation including organ failure and possibly death. Dr. Quentin Ore saw the patient with me and re-iterated the recommendation of admission to the hospital.   Current medicines are reviewed at length with the patient today.   The patient does not have concerns regarding his medicines.  The following changes were made today:  change to lasix regimen.  Labs/ tests ordered today include:  Orders Placed This Encounter  Procedures   Basic metabolic panel   EKG [id number]   Disposition:   Follow up with EP APP  2 weeks, labs next week. He should call 911 with worsening symptoms. He is at severe risk of decompensation and worsening up to and including death. Patient continues to refuse admission to the hospital. If he decides against any aggressive care, I have recommended Hospice enrollment and subsequent de-activation  of his ICD. He said "Let's let it shock me one more time". He plans to talk to family about his prognosis throughout this week.    Jacalyn Lefevre, PA-C  11/01/2020 1:13 PM  [place] [address] [phone] (office) [phone] (fax)

## 2020-10-31 NOTE — Telephone Encounter (Signed)
"  Alert remote reviewed. Normal device function.   Appropriate successful ICD shock for VT that fell into the VF zone.  Sent to triage. Optivol is elevated Next remote 11/18/2020. Kathy Breach, RN, CCDS, [employer]"  Reviewed with Oda Kilts, PA. New orders received for labs, CMET, CBC, TSH, FREE T4. Patient previously scheduled for BMET today at 2 pm. Appt scheduled with AT, PA 11/01/20 at 12:00  Successful telephone encounter to patient and daughter De [place] (on [place]). Patient was unaware of shock. Daughter states patient was in the middle of a panic attack. No loss of consciousness observed. Patient compliant with current medications. Agreed to labs today and appointment with AT, PA tomorrow. Shock plan reviewed however appears to be inappropriate therapy for VT/VF rate. Hx of SVT/AF. ED precautions give. Patient and daughter appreciative of call.

## 2020-11-01 ENCOUNTER — Ambulatory Visit (INDEPENDENT_AMBULATORY_CARE_PROVIDER_SITE_OTHER): Payer: Medicare Other | Admitting: Student

## 2020-11-01 ENCOUNTER — Encounter: Payer: Self-pay | Admitting: Student

## 2020-11-01 VITALS — BP 104/64 | HR 82 | Ht 69.0 in | Wt 133.0 lb

## 2020-11-01 DIAGNOSIS — I4819 Other persistent atrial fibrillation: Secondary | ICD-10-CM | POA: Diagnosis not present

## 2020-11-01 DIAGNOSIS — I5022 Chronic systolic (congestive) heart failure: Secondary | ICD-10-CM

## 2020-11-01 DIAGNOSIS — Z7901 Long term (current) use of anticoagulants: Secondary | ICD-10-CM | POA: Diagnosis not present

## 2020-11-01 DIAGNOSIS — Z950 Presence of cardiac pacemaker: Secondary | ICD-10-CM | POA: Diagnosis not present

## 2020-11-01 DIAGNOSIS — M21371 Foot drop, right foot: Secondary | ICD-10-CM | POA: Diagnosis not present

## 2020-11-01 DIAGNOSIS — I69351 Hemiplegia and hemiparesis following cerebral infarction affecting right dominant side: Secondary | ICD-10-CM | POA: Diagnosis not present

## 2020-11-01 DIAGNOSIS — Z7902 Long term (current) use of antithrombotics/antiplatelets: Secondary | ICD-10-CM | POA: Diagnosis not present

## 2020-11-01 DIAGNOSIS — I255 Ischemic cardiomyopathy: Secondary | ICD-10-CM

## 2020-11-01 LAB — COMPREHENSIVE METABOLIC PANEL
ALT: 16 IU/L (ref 0–44)
AST: 21 IU/L (ref 0–40)
Albumin/Globulin Ratio: 1.4 (ref 1.2–2.2)
Albumin: 3.3 g/dL — ABNORMAL LOW (ref 3.7–4.7)
Alkaline Phosphatase: 102 IU/L (ref 44–121)
BUN/Creatinine Ratio: 14 (ref 10–24)
BUN: 20 mg/dL (ref 8–27)
Bilirubin Total: 1.4 mg/dL — ABNORMAL HIGH (ref 0.0–1.2)
CO2: 19 mmol/L — ABNORMAL LOW (ref 20–29)
Calcium: 8.7 mg/dL (ref 8.6–10.2)
Chloride: 98 mmol/L (ref 96–106)
Creatinine, Ser: 1.43 mg/dL — ABNORMAL HIGH (ref 0.76–1.27)
Globulin, Total: 2.3 g/dL (ref 1.5–4.5)
Glucose: 84 mg/dL (ref 65–99)
Potassium: 4.7 mmol/L (ref 3.5–5.2)
Sodium: 131 mmol/L — ABNORMAL LOW (ref 134–144)
Total Protein: 5.6 g/dL — ABNORMAL LOW (ref 6.0–8.5)
eGFR: 50 mL/min/{1.73_m2} — ABNORMAL LOW (ref >59)

## 2020-11-01 LAB — CBC
Hematocrit: 29.1 % — ABNORMAL LOW (ref 37.5–51.0)
Hemoglobin: 9.3 g/dL — ABNORMAL LOW (ref 13.0–17.7)
MCH: 27.6 pg (ref 26.6–33.0)
MCHC: 32 g/dL (ref 31.5–35.7)
MCV: 86 fL (ref 79–97)
Platelets: 195 10*3/uL (ref 150–450)
RBC: 3.37 x10E6/uL — ABNORMAL LOW (ref 4.14–5.80)
RDW: 15.4 % (ref 11.6–15.4)
WBC: 5.6 10*3/uL (ref 3.4–10.8)

## 2020-11-01 LAB — TSH: TSH: 12 u[IU]/mL — ABNORMAL HIGH (ref 0.450–4.500)

## 2020-11-01 LAB — T4, FREE: Free T4: 1.5 ng/dL (ref 0.82–1.77)

## 2020-11-01 MED ORDER — POTASSIUM CHLORIDE CRYS ER 20 MEQ PO TBCR: EXTENDED_RELEASE_TABLET | ORAL | 3 refills | Status: DC

## 2020-11-01 MED ORDER — FUROSEMIDE 40 MG PO TABS: ORAL_TABLET | ORAL | 3 refills | Status: DC

## 2020-11-01 NOTE — Patient Instructions (Addendum)
Medication Instructions:  Your physician has recommended you make the following change in your medication:   Take Furosemide '60mg'$  twice daily for 3 days then take '40mg'$  twice daily Take Potassium 24mq twice daily for 3 days then take 240m daily  *If you need a refill on your cardiac medications before your next appointment, please call your pharmacy*   Lab Work: BMET on Tuesday  If you have labs (blood work) drawn today and your tests are completely normal, you will receive your results only by: [place]if you have MyChart) OR A paper copy in the mail If you have any lab test that is abnormal or we need to change your treatment, we will call you to review the results.    Follow-Up: [facility].  As part of our continuing mission to provide you with exceptional heart care, we have created designated Provider Care Teams.  These Care Teams include your primary Cardiologist (physician) and Advanced Practice Providers (APPs -  Physician Assistants and Nurse Practitioners) who all work together to provide you with the care you need, when you need it.  Your next appointment:   As scheduled

## 2020-11-04 ENCOUNTER — Telehealth: Payer: Self-pay | Admitting: Internal Medicine

## 2020-11-04 ENCOUNTER — Encounter (INDEPENDENT_AMBULATORY_CARE_PROVIDER_SITE_OTHER): Payer: Medicare Other | Admitting: Ophthalmology

## 2020-11-04 ENCOUNTER — Other Ambulatory Visit: Payer: Self-pay

## 2020-11-04 ENCOUNTER — Other Ambulatory Visit: Payer: Medicare Other | Admitting: *Deleted

## 2020-11-04 DIAGNOSIS — I4819 Other persistent atrial fibrillation: Secondary | ICD-10-CM | POA: Diagnosis not present

## 2020-11-04 DIAGNOSIS — I255 Ischemic cardiomyopathy: Secondary | ICD-10-CM | POA: Diagnosis not present

## 2020-11-04 DIAGNOSIS — I5022 Chronic systolic (congestive) heart failure: Secondary | ICD-10-CM | POA: Diagnosis not present

## 2020-11-04 NOTE — Telephone Encounter (Signed)
See result note.  

## 2020-11-04 NOTE — Procedures (Signed)
ELECTROENCEPHALOGRAM REPORT  Date of Study: 10/24/2020  Patient's Name: Mark Clayton MRN: [id number] Date of Birth: Dec 01, 1940  Referring Provider: Dr. Ellouise Newer  Clinical History: This is a 80 year old man with a history of left MCA stroke with recurrent episodes of staring/unresponsiveness and visual hallucinations.   Medications: Xanax Lipitor Coreg Plavix Zetia Lasix Imdur Remeron Xarelto   Technical Summary: A multichannel digital EEG recording measured by the international 10-20 system with electrodes applied with paste and impedances below 5000 ohms performed as portable with EKG monitoring in an awake patient.  Hyperventilation was not performed. Photic stimulation was performed.  The digital EEG was referentially recorded, reformatted, and digitally filtered in a variety of bipolar and referential montages for optimal display.   Description: The patient is awake during the recording.  During maximal wakefulness, there is a symmetric, medium voltage 6 Hz posterior dominant rhythm that attenuates with eye opening. There is occasional focal 4-5 Hz theta slowing over the left hemisphere. Sleep was not captured. Photic stimulation did not elicit any abnormalities.  There were no epileptiform discharges or electrographic seizures seen.    EKG lead showed irregular rhythm.  Impression: This awake EEG is abnormal due to the presence of: Slowing of the posterior dominant rhythm Occasional focal slowing over the left hemisphere  Clinical Correlation of the above findings indicates diffuse cerebral dysfunction that is non-specific in etiology and can be seen with hypoxic/ischemic injury, toxic/metabolic encephalopathies, neurodegenerative disorders, or medication effect. Focal slowing over the left hemisphere indicates focal cerebral dysfunction suggestive of underlying structural or physiologic abnormality. The absence of epileptiform discharges does not rule out a clinical  diagnosis of epilepsy. If further clinical questions remain, prolonged EEG may be helpful. Clinical correlation is advised.   Ellouise Newer, M.D.

## 2020-11-04 NOTE — Telephone Encounter (Signed)
Please call patient to discuss CT results.

## 2020-11-04 NOTE — Telephone Encounter (Signed)
Noted  

## 2020-11-05 ENCOUNTER — Encounter: Payer: Self-pay | Admitting: Cardiology

## 2020-11-05 ENCOUNTER — Ambulatory Visit: Payer: Medicare Other | Admitting: Cardiology

## 2020-11-05 ENCOUNTER — Other Ambulatory Visit: Payer: Medicare Other

## 2020-11-05 VITALS — BP 130/80 | HR 79 | Ht 69.0 in | Wt 141.0 lb

## 2020-11-05 DIAGNOSIS — I5023 Acute on chronic systolic (congestive) heart failure: Secondary | ICD-10-CM | POA: Insufficient documentation

## 2020-11-05 DIAGNOSIS — Z7901 Long term (current) use of anticoagulants: Secondary | ICD-10-CM | POA: Diagnosis not present

## 2020-11-05 DIAGNOSIS — I25119 Atherosclerotic heart disease of native coronary artery with unspecified angina pectoris: Secondary | ICD-10-CM

## 2020-11-05 DIAGNOSIS — Z95810 Presence of automatic (implantable) cardiac defibrillator: Secondary | ICD-10-CM | POA: Diagnosis not present

## 2020-11-05 DIAGNOSIS — I4819 Other persistent atrial fibrillation: Secondary | ICD-10-CM

## 2020-11-05 DIAGNOSIS — Z79899 Other long term (current) drug therapy: Secondary | ICD-10-CM

## 2020-11-05 DIAGNOSIS — I255 Ischemic cardiomyopathy: Secondary | ICD-10-CM | POA: Diagnosis not present

## 2020-11-05 DIAGNOSIS — I472 Ventricular tachycardia, unspecified: Secondary | ICD-10-CM

## 2020-11-05 HISTORY — DX: Ventricular tachycardia: I47.2

## 2020-11-05 HISTORY — DX: Ventricular tachycardia, unspecified: I47.20

## 2020-11-05 LAB — BASIC METABOLIC PANEL
BUN/Creatinine Ratio: 17 (ref 10–24)
BUN: 19 mg/dL (ref 8–27)
CO2: 24 mmol/L (ref 20–29)
Calcium: 8.7 mg/dL (ref 8.6–10.2)
Chloride: 103 mmol/L (ref 96–106)
Creatinine, Ser: 1.15 mg/dL (ref 0.76–1.27)
Glucose: 94 mg/dL (ref 65–99)
Potassium: 3.8 mmol/L (ref 3.5–5.2)
Sodium: 139 mmol/L (ref 134–144)
eGFR: 65 mL/min/{1.73_m2} (ref >59)

## 2020-11-05 MED ORDER — POTASSIUM CHLORIDE CRYS ER 20 MEQ PO TBCR: 20.0000 meq | EXTENDED_RELEASE_TABLET | Freq: Two times a day (BID) | ORAL | 3 refills | Status: DC

## 2020-11-05 MED ORDER — FUROSEMIDE 40 MG PO TABS: 60.0000 mg | ORAL_TABLET | Freq: Two times a day (BID) | ORAL | 3 refills | Status: DC

## 2020-11-05 NOTE — Patient Instructions (Addendum)
Medication Instructions:  Your physician has recommended you make the following change in your medication:  INCREASE: furosemide (Lasix) to 60 mg by mouth daily (take 1 and 1/2 tablets twice daily)  INCREASE: Potassium to 20 mEq by mouth twice daily  *If you need a refill on your cardiac medications before your next appointment, please call your pharmacy*   Lab Work: IN 3 WEEKS: BMET If you have labs (blood work) drawn today and your tests are completely normal, you will receive your results only by: [place] (if you have MyChart) OR A paper copy in the mail If you have any lab test that is abnormal or we need to change your treatment, we will call you to review the results.   Testing/Procedures: NONE   Follow-Up: At [facility], you and your health needs are our priority.  As part of our continuing mission to provide you with exceptional heart care, we have created designated Provider Care Teams.  These Care Teams include your primary Cardiologist (physician) and Advanced Practice Providers (APPs -  Physician Assistants and Nurse Practitioners) who all work together to provide you with the care you need, when you need it.   Your next appointment:   3 week(s)  The format for your next appointment:   In Person  Provider:   You will see one of the following Advanced Practice Providers on your designated Care Team:   Cecilie Kicks, NP

## 2020-11-05 NOTE — Assessment & Plan Note (Signed)
-   On atorvastatin 80 mg high intensity dose as well as Zetia 10 mg daily.  LDL goal less than 70.  No myalgias.

## 2020-11-05 NOTE — Assessment & Plan Note (Signed)
-   On Xarelto 20 mg daily with supper.  Compliant.  No bleeding.

## 2020-11-05 NOTE — Progress Notes (Deleted)
Cardiology Office Note:    Date:  11/05/2020   ID:  Mark Clayton, DOB February 04, 1941, MRN [id number]  PCP:  Mark Koch, MD   [facility] Providers Cardiologist:  Mark Furbish, MD Electrophysiologist:  Mark Axe, MD { Click to update primary MD,subspecialty MD or APP then REFRESH:1}    Referring MD: Mark Clayton, *    History of Present Illness:    Mark Clayton is a 80 y.o. male here for follow-up of chronic systolic heart failure secondary to ischemic cardiomyopathy s/p CABG with ICD in place, persistent atrial fibrillation.  Recently seen by Mark Clayton on 11/01/2020 had noted decline over the past several months.  Worsening of heart failure since April with OptiVol.  He was taking Lasix 40 mg in the morning and 20 mg in the evening.  Last cath in 2020 see below Last ECHO 2021 during stroke, EF <20.   Past Medical History:  Diagnosis Date   AICD (automatic cardioverter/defibrillator) present 01/17/2003   Medtronic Maximo 7232CX ICD, serial [id number]   Anemia 02-06-11   takes oral iron   Arthritis    hands, knees   CAD (coronary artery disease) 2003   a. h/o MI and CABG in 2003. b. s/p DES to SVG-RPDA-RPLB in 08/2014.   Cancer of sigmoid colon ([place]) 2012   a. s/p colon surgery.   Carotid bruit    Chronic systolic CHF (congestive heart failure) ([place])    a. EF 20% in 2014; b. 08/2017 Echo: EF 20-25%, diff HK, Gr3 DD. Triv AI. Mod MR. Sev dil LA. Mildly dil RV w/ mildly reduced RV fxn. Mildly dil RA. Mod TR. PASP 11mg.   Cough    Exudative age-related macular degeneration of left eye with active choroidal neovascularization ([facility] 07/12/2019   Exudative age-related macular degeneration, right eye, with inactive choroidal neovascularization ([facility] 07/12/2019   GERD (gastroesophageal reflux disease) 02-06-11   HTN (hypertension)    Hyperlipidemia    Ischemic cardiomyopathy    a. EF 20% in 2014. (Master study EF >20%); b. 08/2017 Echo: EF 20-25%, diff HK. Gr3  DD.   LV (left ventricular) mural thrombus    a. 12/2012 Echo: EF 20% with mural thrombus No evidence of thrombus on 08/2017 echo.   Macular degeneration    [place]    2003   PAF (paroxysmal atrial fibrillation) (HCC)    a. CHA2DS2VASc = 5-->Xarelto/Tikosyn.    Past Surgical History:  Procedure Laterality Date   CARDIAC CATHETERIZATION N/A 09/20/2014   Procedure: Left Heart Cath and Coronary Angiography;  Surgeon: JJettie Booze MD; LAD 95%, D1 100%, CFX liner percent, OM 200%, OM 390%, RCA 90%, LIMA-LAD okay, SVG-OM 2-OM 3 minimal disease, SVG-RPDA-RPLB 100% between the RPDA and RPL      CARDIAC CATHETERIZATION N/A 09/20/2014   Procedure: Coronary Stent Intervention;  Surgeon: JJettie Booze MD; Synergy DES 4 x 24 mm reducing the stenosis to 5%    CARDIAC DEFIBRILLATOR PLACEMENT  01/17/03   6949 lead. Medtronic. remote-no; with later revision   CARDIOVERSION N/A 05/22/2016   Procedure: CARDIOVERSION;  Surgeon: BLelon Perla MD;  [facility]  Service: Cardiovascular;  Laterality: N/A;   CARDIOVERSION N/A 08/10/2017   Procedure: CARDIOVERSION;  Surgeon: HPixie Casino MD;  [facility]  Service: Cardiovascular;  Laterality: N/A;   CATARACT EXTRACTION W/ INTRAOCULAR LENS  IMPLANT, BILATERAL Bilateral June/-July 2009   Dr. GKaty Fitch  CATARACT EXTRACTION W/PHACO Bilateral 2010   Dr. GKaty Fitch  COLON RESECTION  02/09/2011   Procedure: LAPAROSCOPIC SIGMOID COLON RESECTION;  Surgeon: Pedro Earls, MD;  [facility];  Service: General;  Laterality: N/A;  Laparoscopic Assisted Sigmoid Colectomy   COLON SURGERY     COLONOSCOPY  08/31/2011   Procedure: COLONOSCOPY;  Surgeon: Jerene Bears, MD;  [facility];  Service: Gastroenterology;  Laterality: N/A;   COLONOSCOPY N/A 09/05/2012   Procedure: COLONOSCOPY;  Surgeon: Jerene Bears, MD;  [facility];  Service: Gastroenterology;  Laterality: N/A;   COLONOSCOPY N/A 04/18/2013    Procedure: COLONOSCOPY;  Surgeon: Jerene Bears, MD;  [facility];  Service: Gastroenterology;  Laterality: N/A;   COLONOSCOPY N/A 04/09/2014   Procedure: COLONOSCOPY;  Surgeon: Jerene Bears, MD;  [facility];  Service: Gastroenterology;  Laterality: N/A;   COLONOSCOPY WITH PROPOFOL N/A 05/03/2017   Procedure: COLONOSCOPY WITH PROPOFOL;  Surgeon: Jerene Bears, MD;  [facility];  Service: Gastroenterology;  Laterality: N/A;   CORONARY ANGIOPLASTY     CORONARY ARTERY BYPASS GRAFT  01/2002   LIMA-LAD, SVG-OM 2-OM 3, SVG-RPDA-RPLB   CORONARY STENT INTERVENTION N/A 11/22/2018   Procedure: CORONARY STENT INTERVENTION;  Surgeon: Nelva Bush, MD;  Location: [place] [facility];  Service: Cardiovascular;  Laterality: N/A;  SVG - RCA   ICD GENERATOR CHANGE  2010   Medtronic Virtuoso II VR ICD   ICD GENERATOR CHANGEOUT N/A 12/07/2018   Procedure: ICD GENERATOR CHANGEOUT;  Surgeon: Deboraha Sprang, MD;  Location: [place] [facility];  Service: Cardiovascular;  Laterality: N/A;   INGUINAL HERNIA REPAIR Right 2000's X 2   LAPAROSCOPIC RIGHT HEMI COLECTOMY N/A 11/04/2012   Procedure: LAPAROSCOPIC RIGHT HEMI COLECTOMY;  Surgeon: Pedro Earls, MD;  [facility];  Service: General;  Laterality: N/A;   LEFT HEART CATH AND CORS/GRAFTS ANGIOGRAPHY Left 11/22/2018   Procedure: LEFT HEART CATH AND CORS/GRAFTS ANGIOGRAPHY;  Surgeon: Nelva Bush, MD;  Location: [place] [facility];  Service: Cardiovascular;  Laterality: Left;   TONSILLECTOMY  ~ 1950    Current Medications: Current Meds  Medication Sig   acetaminophen (TYLENOL) 325 MG tablet Take 1-2 tablets (325-650 mg total) by mouth every 4 (four) hours as needed for mild pain.   ALPRAZolam (XANAX) 0.25 MG tablet TAKE 1 TABLET BY MOUTH TWICE A DAY AS NEEDED FOR ANXIETY   atorvastatin (LIPITOR) 80 MG tablet TAKE 1 TABLET BY MOUTH  DAILY   carvedilol (COREG) 6.25 MG tablet TAKE 1 TABLET BY MOUTH  TWICE DAILY WITH A MEAL    clopidogrel (PLAVIX) 75 MG tablet TAKE 1 TABLET BY MOUTH  DAILY   ezetimibe (ZETIA) 10 MG tablet TAKE 1 TABLET BY MOUTH  DAILY   furosemide (LASIX) 40 MG tablet Take '60mg'$  twice daily for 3 days then take '40mg'$  twice daily   isosorbide mononitrate (IMDUR) 30 MG 24 hr tablet Take 1 tablet (30 mg total) by mouth daily.   losartan (COZAAR) 50 MG tablet TAKE 1 TABLET BY MOUTH  DAILY   meclizine (ANTIVERT) 25 MG tablet Take 1 tablet (25 mg total) by mouth daily as needed for dizziness.   nitroGLYCERIN (NITROSTAT) 0.4 MG SL tablet DISSOLVE 1 TABLET UNDER THE TONGUE EVERY 5 MINUTES AS  NEEDED FOR CHEST PAIN. MAX  OF 3 TABLETS IN 15 MINUTES. CALL 911 IF PAIN PERSISTS.   pantoprazole (PROTONIX) 40 MG tablet TAKE 1 TABLET BY MOUTH  DAILY   polyethylene glycol (MIRALAX / GLYCOLAX) 17 g packet Take 17 g by  mouth as needed.   potassium chloride (KLOR-CON) 20 MEQ tablet Take 53mq twice daily for 3 days then take 276m daily   promethazine-dextromethorphan (PROMETHAZINE-DM) 6.25-15 MG/5ML syrup Take 5 mLs by mouth 4 (four) times daily as needed for cough.   rivaroxaban (XARELTO) 20 MG TABS tablet Take 1 tablet (20 mg total) by mouth daily with supper.   vitamin B-12 (CYANOCOBALAMIN) 100 MCG tablet Take 100 mcg by mouth daily.      Allergies:   Buspar [buspirone], Latex, and Tape   Social History   Socioeconomic History   Marital status: Widowed    Spouse name: Not on file   Number of children: 3   Years of education: Not on file   Highest education level: Not on file  Occupational History   Occupation: self employed/ BaArt gallery managerTobacco Use   Smoking status: Never   Smokeless tobacco: Never  Vaping Use   Vaping Use: Never used  Substance and Sexual Activity   Alcohol use: Yes    Alcohol/week: 2.0 standard drinks    Types: 2 Cans of beer per week   Drug use: No   Sexual activity: Not Currently  Other Topics Concern   Not on file  Social History Narrative   Full time. Married.    Right handed    Drinks caffeine   Two story home   Social Determinants of Health   Financial Resource Strain: Low Risk    Difficulty of Paying Living Expenses: Not hard at all  Food Insecurity: No Food Insecurity   Worried About RuCharity fundraisern the Last Year: Never true   RaArboriculturistn the Last Year: Never true  Transportation Needs: No Transportation Needs   Lack of Transportation (Medical): No   Lack of Transportation (Non-Medical): No  Physical Activity: Insufficiently Active   Days of Exercise per Week: 2 days   Minutes of Exercise per Session: 60 min  Stress: No Stress Concern Present   Feeling of Stress : Not at all  Social Connections: Not on file     Family History: The patient's ***family history includes Alzheimer's disease in his mother; Heart disease in his father; Hyperlipidemia in his father and sister; Hypertension in his father and sister; Prostate cancer in his father. There is no history of Colon cancer, Esophageal cancer, Rectal cancer, or Stomach cancer.  ROS:   Please see the history of present illness.    *** All other systems reviewed and are negative.  EKGs/Labs/Other Studies Reviewed:    The following studies were reviewed today:  ECHO 06/07/19:   1. No mural thrombus with Definity contrast. Left ventricular ejection  fraction, by estimation, is <20%. The left ventricle has severely  decreased function. The left ventricle demonstrates global hypokinesis  with regional variation. The left ventricular   internal cavity size was moderately to severely dilated.   2. Right ventricular systolic function is moderately reduced. The right  ventricular size is mildly enlarged. A AICD wire is visualized. There is  moderately elevated pulmonary artery systolic pressure.   3. Left atrial size was moderately dilated.   4. The mitral valve is abnormal. Mild mitral valve regurgitation.   5. The aortic valve is tricuspid. Aortic valve regurgitation is mild.  Mild  aortic valve sclerosis is present, with no evidence of aortic valve  stenosis.   6. The inferior vena cava is dilated in size with <50% respiratory  variability, suggesting right atrial pressure of 15 mmHg.  Comparison(s): Changes from prior study are noted. 09/03/2017: LVEF 20-25%.  Cath 11/22/18: Multivessel coronary artery disease, as outlined below.  Culprit lesion for the patient's unstable angina is most likely the anastomotic lesion at the SVG to rPDA.  Otherwise, coronary and graft disease appears stable from prior catheterization in 2016. Patent mid RCA stent with mild focal in-stent restenosis. Widely patent LIMA to LAD. Widely patent sequential SVG to ramus intermedius, OM2, and OM3. Patent sequential SVG to RPDA with 95% focal stenosis at the side to side anastomosis of graft to rPDA.  Jump portion to rPL remains chronically occluded. Moderately elevated left ventricular filling pressure. Successful PCI to SVG to RPDA using Resolute Onyx 2.75 x 26 mm drug-eluting stent with 0% residual stenosis and TIMI-3 flow.   Recommendations: Admit for overnight extended recovery. Gentle diuresis, given moderately elevated left ventricular filling pressure. Restart rivaroxaban 20 mg daily tomorrow if no evidence of bleeding.  Patient will need to continue on rivaroxaban and clopidogrel 75 mg daily for at least 6 to 12 months, as tolerated. Aggressive secondary prevention.   Nelva Bush, MD  Diagnostic Dominance: Right    Intervention     EKG:  EKG is *** ordered today.  The ekg ordered today demonstrates ***  Recent Labs: 05/23/2020: Magnesium 2.0 10/31/2020: ALT 16; Hemoglobin 9.3; Platelets 195; TSH 12.000 11/04/2020: BUN 19; Creatinine, Ser 1.15; Potassium 3.8; Sodium 139  Recent Lipid Panel    Component Value Date/Time   CHOL 121 06/07/2019 0540   CHOL 182 11/16/2018 0900   TRIG 112 06/07/2019 0540   HDL 39 (L) 06/07/2019 0540   HDL 56 11/16/2018 0900   CHOLHDL 3.1  06/07/2019 0540   VLDL 22 06/07/2019 0540   LDLCALC 60 06/07/2019 0540   LDLCALC 102 (H) 11/16/2018 0900   LDLDIRECT 104 (H) 03/28/2019 1018     Risk Assessment/Calculations:   {Does this patient have ATRIAL FIBRILLATION?:[phone]}       Physical Exam:    VS:  BP 130/80   Pulse 79   Ht '5\' 9"'$  (1.753 m)   Wt 141 lb (64 kg)   SpO2 99%   BMI 20.82 kg/m     Wt Readings from Last 3 Encounters:  11/05/20 141 lb (64 kg)  11/01/20 133 lb (60.3 kg)  10/11/20 133 lb (60.3 kg)     GEN: *** Well nourished, well developed in no acute distress HEENT: Normal NECK: Jaw line JVD; No carotid bruits LYMPHATICS: No lymphadenopathy CARDIAC: ***RRR, no murmurs, rubs, gallops RESPIRATORY:  Clear to auscultation without rales, wheezing or rhonchi  ABDOMEN: Soft, non-tender, non-distended MUSCULOSKELETAL:  No edema; No deformity  SKIN: Warm and dry NEUROLOGIC:  Alert and oriented x 3 PSYCHIATRIC:  Normal affect   ASSESSMENT:    1. Acute on chronic systolic heart failure (HCC)   2. Persistent atrial fibrillation ([place])   3. Ischemic cardiomyopathy   4. Implantable cardioverter-defibrillator (ICD) in situ   5. Medication management   6. Coronary artery disease involving native coronary artery of native heart with angina pectoris ([place])    PLAN:    In order of problems listed above:  Acute on chronic systolic heart failure - Ejection fraction less than 20% on echocardiogram in 2021. - He was markedly volume overloaded 11/01/2020 during Andy's visit - He refused admission to the hospital.  He had soft pitting edema up to his flanks and JVP to his jaw.  When he was previously admitted for a stroke he had such a bad experience  he does not wish to go back. -He was asked to take Lasix 60 mg twice a day for 3 days then 40 mg twice daily.  He was also asked to take potassium 20 meq bid for 3 days and then 20 meq once daily -Continue with losartan 50 mg for angiotensin receptor blocker,  isosorbide 30 mg a day, carvedilol 6.25 mg twice a day  Advanced directives - And he discussed this with him, he wants his device to shock him at least once more but he does not want CPR.  He advised he and his family to keep his advanced directives paperwork at home. -He is at high risk for decompensation including organ failure and possible death.  Coronary artery disease/ischemic cardiomyopathy - There was some question about updating his ischemic work-up.  He is not having any overt chest discomfort.  Given the fact that he was markedly volume overloaded, he wished to defer any procedures.  I would agree that this would not likely change his poor prognosis.  Persistent atrial fibrillation - Previously has failed amiodarone due to weakness and shortness of breath  Chronic anticoagulation - On Xarelto 20 mg daily with supper.  Compliant.  No bleeding.  Hyperlipidemia - On atorvastatin 80 mg high intensity dose as well as Zetia 10 mg daily.  LDL goal less than 70.  No myalgias.  {Are you ordering a CV Procedure (e.g. stress test, cath, DCCV, TEE, etc)?   Press F2        :[id number]    Medication Adjustments/Labs and Tests Ordered: Current medicines are reviewed at length with the patient today.  Concerns regarding medicines are outlined above.  No orders of the defined types were placed in this encounter.  No orders of the defined types were placed in this encounter.   There are no Patient Instructions on file for this visit.   Signed, Mark Furbish, MD  11/05/2020 9:44 AM    [facility]

## 2020-11-05 NOTE — Progress Notes (Signed)
Cardiology Office Note:    Date:  11/05/2020   ID:  Mark Clayton, DOB 02-07-1941, MRN [id number]  PCP:  [name], MD   [facility] Providers Cardiologist:  Candee Furbish, MD Electrophysiologist:  Virl Axe, MD      Referring MD: [name], *    History of Present Illness:    Mark Clayton is a 80 y.o. male here for follow-up of chronic systolic heart failure secondary to ischemic cardiomyopathy s/p CABG with ICD in place, persistent atrial fibrillation.  Recently seen by Oda Kilts on 11/01/2020 had noted decline over the past several months.  Worsening of heart failure since April with OptiVol.  He was taking Lasix 40 mg in the morning and 20 mg in the evening.  Last cath in 2020 see below Last ECHO 2021 during stroke, EF <20.   Today, he is accompanied by a family member. Overall he feels a lot better. His LE edema is still present, but has improved. Last night he began taking 40 mg furosemide. Previously while taking 60 mg furosemide, he was urinating every 15 minutes. Also, he continues to have some difficulty walking.  He denies any palpitations, chest pain, or shortness of breath. No lightheadedness, headaches, syncope, orthopnea, or PND. Also has no exertional symptoms.   Past Medical History:  Diagnosis Date   AICD (automatic cardioverter/defibrillator) present 01/17/2003   Medtronic Maximo 7232CX ICD, serial [id number]   Anemia 02-06-11   takes oral iron   Arthritis    hands, knees   CAD (coronary artery disease) 2003   a. h/o MI and CABG in 2003. b. s/p DES to SVG-RPDA-RPLB in 08/2014.   Cancer of sigmoid colon ([place]) 2012   a. s/p colon surgery.   Carotid bruit    Chronic systolic CHF (congestive heart failure) ([place])    a. EF 20% in 2014; b. 08/2017 Echo: EF 20-25%, diff HK, Gr3 DD. Triv AI. Mod MR. Sev dil LA. Mildly dil RV w/ mildly reduced RV fxn. Mildly dil RA. Mod TR. PASP 48mg.   Cough    Exudative age-related macular  degeneration of left eye with active choroidal neovascularization ([place] 07/12/2019   Exudative age-related macular degeneration, right eye, with inactive choroidal neovascularization ([place] 07/12/2019   GERD (gastroesophageal reflux disease) 02-06-11   HTN (hypertension)    Hyperlipidemia    Ischemic cardiomyopathy    a. EF 20% in 2014. (Master study EF >20%); b. 08/2017 Echo: EF 20-25%, diff HK. Gr3 DD.   LV (left ventricular) mural thrombus    a. 12/2012 Echo: EF 20% with mural thrombus No evidence of thrombus on 08/2017 echo.   Macular degeneration    Myocardial infarct ([place]    2003   PAF (paroxysmal atrial fibrillation) (HCC)    a. CHA2DS2VASc = 5-->Xarelto/Tikosyn.   Ventricular tachycardia ([place] 11/05/2020    Past Surgical History:  Procedure Laterality Date   CARDIAC CATHETERIZATION N/A 09/20/2014   Procedure: Left Heart Cath and Coronary Angiography;  Surgeon: JJettie Booze MD; LAD 95%, D1 100%, CFX liner percent, OM 200%, OM 390%, RCA 90%, LIMA-LAD okay, SVG-OM 2-OM 3 minimal disease, SVG-RPDA-RPLB 100% between the RPDA and RPL      CARDIAC CATHETERIZATION N/A 09/20/2014   Procedure: Coronary Stent Intervention;  Surgeon: JJettie Booze MD; Synergy DES 4 x 24 mm reducing the stenosis to 5%    CARDIAC DEFIBRILLATOR PLACEMENT  01/17/03   6949 lead. Medtronic. remote-no; with later revision   CARDIOVERSION N/A 05/22/2016  Procedure: CARDIOVERSION;  Surgeon: Lelon Perla, MD;  Location: [facility] ENDOSCOPY;  Service: Cardiovascular;  Laterality: N/A;   CARDIOVERSION N/A 08/10/2017   Procedure: CARDIOVERSION;  Surgeon: Pixie Casino, MD;  Location: [facility] ENDOSCOPY;  Service: Cardiovascular;  Laterality: N/A;   CATARACT EXTRACTION W/ INTRAOCULAR LENS  IMPLANT, BILATERAL Bilateral June/-July 2009   Dr. Katy Fitch   CATARACT EXTRACTION W/PHACO Bilateral 2010   Dr. Katy Fitch   COLON RESECTION  02/09/2011   Procedure: LAPAROSCOPIC SIGMOID COLON RESECTION;  Surgeon: Pedro Earls, MD;   Location: [facility] ORS;  Service: General;  Laterality: N/A;  Laparoscopic Assisted Sigmoid Colectomy   COLON SURGERY     COLONOSCOPY  08/31/2011   Procedure: COLONOSCOPY;  Surgeon: Jerene Bears, MD;  Location: [facility] ENDOSCOPY;  Service: Gastroenterology;  Laterality: N/A;   COLONOSCOPY N/A 09/05/2012   Procedure: COLONOSCOPY;  Surgeon: Jerene Bears, MD;  Location: [facility] ENDOSCOPY;  Service: Gastroenterology;  Laterality: N/A;   COLONOSCOPY N/A 04/18/2013   Procedure: COLONOSCOPY;  Surgeon: Jerene Bears, MD;  Location: [facility] ENDOSCOPY;  Service: Gastroenterology;  Laterality: N/A;   COLONOSCOPY N/A 04/09/2014   Procedure: COLONOSCOPY;  Surgeon: Jerene Bears, MD;  Location: [facility] ENDOSCOPY;  Service: Gastroenterology;  Laterality: N/A;   COLONOSCOPY WITH PROPOFOL N/A 05/03/2017   Procedure: COLONOSCOPY WITH PROPOFOL;  Surgeon: Jerene Bears, MD;  Location: [facility] ENDOSCOPY;  Service: Gastroenterology;  Laterality: N/A;   CORONARY ANGIOPLASTY     CORONARY ARTERY BYPASS GRAFT  01/2002   LIMA-LAD, SVG-OM 2-OM 3, SVG-RPDA-RPLB   CORONARY STENT INTERVENTION N/A 11/22/2018   Procedure: CORONARY STENT INTERVENTION;  Surgeon: Nelva Bush, MD;  Location: [place] CV LAB;  Service: Cardiovascular;  Laterality: N/A;  SVG - RCA   ICD GENERATOR CHANGE  2010   Medtronic Virtuoso II VR ICD   ICD GENERATOR CHANGEOUT N/A 12/07/2018   Procedure: ICD GENERATOR CHANGEOUT;  Surgeon: Deboraha Sprang, MD;  Location: [place] CV LAB;  Service: Cardiovascular;  Laterality: N/A;   INGUINAL HERNIA REPAIR Right 2000's X 2   LAPAROSCOPIC RIGHT HEMI COLECTOMY N/A 11/04/2012   Procedure: LAPAROSCOPIC RIGHT HEMI COLECTOMY;  Surgeon: Pedro Earls, MD;  Location: [facility] ORS;  Service: General;  Laterality: N/A;   LEFT HEART CATH AND CORS/GRAFTS ANGIOGRAPHY Left 11/22/2018   Procedure: LEFT HEART CATH AND CORS/GRAFTS ANGIOGRAPHY;  Surgeon: Nelva Bush, MD;  Location: [place] CV LAB;  Service: Cardiovascular;  Laterality: Left;    TONSILLECTOMY  ~ 1950    Current Medications: Current Meds  Medication Sig   acetaminophen (TYLENOL) 325 MG tablet Take 1-2 tablets (325-650 mg total) by mouth every 4 (four) hours as needed for mild pain.   ALPRAZolam (XANAX) 0.25 MG tablet TAKE 1 TABLET BY MOUTH TWICE A DAY AS NEEDED FOR ANXIETY   atorvastatin (LIPITOR) 80 MG tablet TAKE 1 TABLET BY MOUTH  DAILY   carvedilol (COREG) 6.25 MG tablet TAKE 1 TABLET BY MOUTH  TWICE DAILY WITH A MEAL   clopidogrel (PLAVIX) 75 MG tablet TAKE 1 TABLET BY MOUTH  DAILY   ezetimibe (ZETIA) 10 MG tablet TAKE 1 TABLET BY MOUTH  DAILY   furosemide (LASIX) 40 MG tablet Take 1.5 tablets (60 mg total) by mouth 2 (two) times daily.   isosorbide mononitrate (IMDUR) 30 MG 24 hr tablet Take 1 tablet (30 mg total) by mouth daily.   losartan (COZAAR) 50 MG tablet TAKE 1 TABLET BY MOUTH  DAILY   meclizine (ANTIVERT) 25 MG tablet Take 1 tablet (25  mg total) by mouth daily as needed for dizziness.   nitroGLYCERIN (NITROSTAT) 0.4 MG SL tablet DISSOLVE 1 TABLET UNDER THE TONGUE EVERY 5 MINUTES AS  NEEDED FOR CHEST PAIN. MAX  OF 3 TABLETS IN 15 MINUTES. CALL 911 IF PAIN PERSISTS.   pantoprazole (PROTONIX) 40 MG tablet TAKE 1 TABLET BY MOUTH  DAILY   polyethylene glycol (MIRALAX / GLYCOLAX) 17 g packet Take 17 g by mouth as needed.   potassium chloride SA (KLOR-CON) 20 MEQ tablet Take 1 tablet (20 mEq total) by mouth 2 (two) times daily.   promethazine-dextromethorphan (PROMETHAZINE-DM) 6.25-15 MG/5ML syrup Take 5 mLs by mouth 4 (four) times daily as needed for cough.   rivaroxaban (XARELTO) 20 MG TABS tablet Take 1 tablet (20 mg total) by mouth daily with supper.   vitamin B-12 (CYANOCOBALAMIN) 100 MCG tablet Take 100 mcg by mouth daily.    [DISCONTINUED] furosemide (LASIX) 40 MG tablet Take '60mg'$  twice daily for 3 days then take '40mg'$  twice daily   [DISCONTINUED] potassium chloride (KLOR-CON) 20 MEQ tablet Take 18mq twice daily for 3 days then take 260m daily      Allergies:   Buspar [buspirone], Latex, and Tape   Social History   Socioeconomic History   Marital status: Widowed    Spouse name: Not on file   Number of children: 3   Years of education: Not on file   Highest education level: Not on file  Occupational History   Occupation: self employed/ BaArt gallery managerTobacco Use   Smoking status: Never   Smokeless tobacco: Never  Vaping Use   Vaping Use: Never used  Substance and Sexual Activity   Alcohol use: Yes    Alcohol/week: 2.0 standard drinks    Types: 2 Cans of beer per week   Drug use: No   Sexual activity: Not Currently  Other Topics Concern   Not on file  Social History Narrative   Full time. Married.    Right handed   Drinks caffeine   Two story home   Social Determinants of Health   Financial Resource Strain: Low Risk    Difficulty of Paying Living Expenses: Not hard at all  Food Insecurity: No Food Insecurity   Worried About RuCharity fundraisern the Last Year: Never true   RaArboriculturistn the Last Year: Never true  Transportation Needs: No Transportation Needs   Lack of Transportation (Medical): No   Lack of Transportation (Non-Medical): No  Physical Activity: Insufficiently Active   Days of Exercise per Week: 2 days   Minutes of Exercise per Session: 60 min  Stress: No Stress Concern Present   Feeling of Stress : Not at all  Social Connections: Not on file     Family History: The patient's family history includes Alzheimer's disease in his mother; Heart disease in his father; Hyperlipidemia in his father and sister; Hypertension in his father and sister; Prostate cancer in his father. There is no history of Colon cancer, Esophageal cancer, Rectal cancer, or Stomach cancer.  ROS:   Please see the history of present illness.    (+) Bilateral LE edema All other systems reviewed and are negative.  EKGs/Labs/Other Studies Reviewed:    The following studies were reviewed today:  ECHO 06/07/19:   1. No  mural thrombus with Definity contrast. Left ventricular ejection  fraction, by estimation, is <20%. The left ventricle has severely  decreased function. The left ventricle demonstrates global hypokinesis  with regional variation. The  left ventricular   internal cavity size was moderately to severely dilated.   2. Right ventricular systolic function is moderately reduced. The right  ventricular size is mildly enlarged. A AICD wire is visualized. There is  moderately elevated pulmonary artery systolic pressure.   3. Left atrial size was moderately dilated.   4. The mitral valve is abnormal. Mild mitral valve regurgitation.   5. The aortic valve is tricuspid. Aortic valve regurgitation is mild.  Mild aortic valve sclerosis is present, with no evidence of aortic valve  stenosis.   6. The inferior vena cava is dilated in size with <50% respiratory  variability, suggesting right atrial pressure of 15 mmHg.   Comparison(s): Changes from prior study are noted. 09/03/2017: LVEF 20-25%.  Cath 11/22/18: Multivessel coronary artery disease, as outlined below.  Culprit lesion for the patient's unstable angina is most likely the anastomotic lesion at the SVG to rPDA.  Otherwise, coronary and graft disease appears stable from prior catheterization in 2016. Patent mid RCA stent with mild focal in-stent restenosis. Widely patent LIMA to LAD. Widely patent sequential SVG to ramus intermedius, OM2, and OM3. Patent sequential SVG to RPDA with 95% focal stenosis at the side to side anastomosis of graft to rPDA.  Jump portion to rPL remains chronically occluded. Moderately elevated left ventricular filling pressure. Successful PCI to SVG to RPDA using Resolute Onyx 2.75 x 26 mm drug-eluting stent with 0% residual stenosis and TIMI-3 flow.   Recommendations: Admit for overnight extended recovery. Gentle diuresis, given moderately elevated left ventricular filling pressure. Restart rivaroxaban 20 mg daily  tomorrow if no evidence of bleeding.  Patient will need to continue on rivaroxaban and clopidogrel 75 mg daily for at least 6 to 12 months, as tolerated. Aggressive secondary prevention.   Nelva Bush, MD  Diagnostic Dominance: Right    Intervention     EKG:   11/05/2020: EKG is not ordered today.  Recent Labs: 05/23/2020: Magnesium 2.0 10/31/2020: ALT 16; Hemoglobin 9.3; Platelets 195; TSH 12.000 11/04/2020: BUN 19; Creatinine, Ser 1.15; Potassium 3.8; Sodium 139  Recent Lipid Panel    Component Value Date/Time   CHOL 121 06/07/2019 0540   CHOL 182 11/16/2018 0900   TRIG 112 06/07/2019 0540   HDL 39 (L) 06/07/2019 0540   HDL 56 11/16/2018 0900   CHOLHDL 3.1 06/07/2019 0540   VLDL 22 06/07/2019 0540   LDLCALC 60 06/07/2019 0540   LDLCALC 102 (H) 11/16/2018 0900   LDLDIRECT 104 (H) 03/28/2019 1018     Risk Assessment/Calculations:          Physical Exam:    VS:  BP 130/80   Pulse 79   Ht '5\' 9"'$  (1.753 m)   Wt 141 lb (64 kg)   SpO2 99%   BMI 20.82 kg/m     Wt Readings from Last 3 Encounters:  11/05/20 141 lb (64 kg)  11/01/20 133 lb (60.3 kg)  10/11/20 133 lb (60.3 kg)     GEN: Well nourished, well developed in no acute distress HEENT: Normal NECK: Jaw line JVD; No carotid bruits LYMPHATICS: No lymphadenopathy CARDIAC: RRR, no murmurs, rubs, gallops RESPIRATORY:  Clear to auscultation without rales, wheezing or rhonchi  ABDOMEN: Soft, non-tender, non-distended MUSCULOSKELETAL:  2-3+ LE edema (L>R); No deformity  SKIN: Warm and dry NEUROLOGIC:  Alert and oriented x 3 PSYCHIATRIC:  Normal affect   ASSESSMENT:    1. Acute on chronic systolic heart failure (HCC)   2. Persistent atrial fibrillation ([place])   3.  Ischemic cardiomyopathy   4. Implantable cardioverter-defibrillator (ICD) in situ   5. Medication management   6. Coronary artery disease involving native coronary artery of native heart with angina pectoris ([place])   7. Chronic anticoagulation    8. Ventricular tachycardia (HCC)    PLAN:    In order of problems listed above:  Acute on chronic systolic heart failure (HCC) - Ejection fraction less than 20% on echocardiogram in 2021. - He was markedly volume overloaded 11/01/2020 during Andy's visit - He refused admission to the hospital.  He had soft pitting edema up to his flanks and JVP to his jaw.  When he was previously admitted for a stroke he had such a bad experience he does not wish to go back. -He was asked to take Lasix 60 mg twice a day for 3 days then 40 mg twice daily.  He was also asked to take potassium 20 meq bid for 3 days and then 20 meq once daily. -Since he did make some improvement with his edema he states even though his weight is increased, we will change his Lasix back to 60 mg twice a day with potassium 20 mill equivalents twice a day as well. -Continue with losartan 50 mg for angiotensin receptor blocker, isosorbide 30 mg a day, carvedilol 6.25 mg twice a day -In 3 weeks lets see him back in clinic with APP and check a basic metabolic profile at that time.  Persistent atrial fibrillation - Previously has failed amiodarone due to weakness and shortness of breath  Chronic anticoagulation - On Xarelto 20 mg daily with supper.  Compliant.  No bleeding.  Coronary artery disease involving native heart with angina pectoris ([place]) - There was some question about updating his ischemic work-up.  He is not having any overt chest discomfort.  Given the fact that he was markedly volume overloaded, he wished to defer any procedures.  I would agree that this would not likely change his poor prognosis.  Hyperlipidemia - On atorvastatin 80 mg high intensity dose as well as Zetia 10 mg daily.  LDL goal less than 70.  No myalgias.  Ventricular tachycardia ([place]) ATP was utilized by his defibrillator.  No shocks.  Discussed with Oda Kilts.  Advanced directives - And he discussed this with him, he wants his device to shock  him at least once more but he does not want CPR.  He advised he and his family to keep his advanced directives paperwork at home. -He is at high risk for decompensation including organ failure and possible death.    Follow-up: 3 weeks with APP  Medication Adjustments/Labs and Tests Ordered: Current medicines are reviewed at length with the patient today.  Concerns regarding medicines are outlined above.  Orders Placed This Encounter  Procedures   Basic metabolic panel    Meds ordered this encounter  Medications   furosemide (LASIX) 40 MG tablet    Sig: Take 1.5 tablets (60 mg total) by mouth 2 (two) times daily.    Dispense:  270 tablet    Refill:  3   potassium chloride SA (KLOR-CON) 20 MEQ tablet    Sig: Take 1 tablet (20 mEq total) by mouth 2 (two) times daily.    Dispense:  180 tablet    Refill:  3    Patient Instructions  Medication Instructions:  Your physician has recommended you make the following change in your medication:  INCREASE: furosemide (Lasix) to 60 mg by mouth daily (take 1 and 1/2 tablets  twice daily)  INCREASE: Potassium to 20 mEq by mouth twice daily  *If you need a refill on your cardiac medications before your next appointment, please call your pharmacy*   Lab Work: IN 3 WEEKS: BMET If you have labs (blood work) drawn today and your tests are completely normal, you will receive your results only by: [place] (if you have MyChart) OR A paper copy in the mail If you have any lab test that is abnormal or we need to change your treatment, we will call you to review the results.   Testing/Procedures: NONE   Follow-Up: At [facility], you and your health needs are our priority.  As part of our continuing mission to provide you with exceptional heart care, we have created designated Provider Care Teams.  These Care Teams include your primary Cardiologist (physician) and Advanced Practice Providers (APPs -  Physician Assistants and Nurse  Practitioners) who all work together to provide you with the care you need, when you need it.   Your next appointment:   3 week(s)  The format for your next appointment:   In Person  Provider:   You will see one of the following Advanced Practice Providers on your designated Care Team:   Cecilie Kicks, NP          [facility] Stumpf,acting as a scribe for Candee Furbish, MD.,have documented all relevant documentation on the behalf of Candee Furbish, MD,as directed by  Candee Furbish, MD while in the presence of Candee Furbish, MD.  I, Candee Furbish, MD, have reviewed all documentation for this visit. The documentation on 11/05/20 for the exam, diagnosis, procedures, and orders are all accurate and complete.   Signed, Candee Furbish, MD  11/05/2020 10:07 AM    [facility]

## 2020-11-05 NOTE — Assessment & Plan Note (Signed)
-   Ejection fraction less than 20% on echocardiogram in 2021. - He was markedly volume overloaded 11/01/2020 during Andy's visit - He refused admission to the hospital.  He had soft pitting edema up to his flanks and JVP to his jaw.  When he was previously admitted for a stroke he had such a bad experience he does not wish to go back. -He was asked to take Lasix 60 mg twice a day for 3 days then 40 mg twice daily.  He was also asked to take potassium 20 meq bid for 3 days and then 20 meq once daily. -Since he did make some improvement with his edema he states even though his weight is increased, we will change his Lasix back to 60 mg twice a day with potassium 20 mill equivalents twice a day as well. -Continue with losartan 50 mg for angiotensin receptor blocker, isosorbide 30 mg a day, carvedilol 6.25 mg twice a day -In 3 weeks lets see him back in clinic with APP and check a basic metabolic profile at that time.

## 2020-11-05 NOTE — Assessment & Plan Note (Signed)
-   There was some question about updating his ischemic work-up.  He is not having any overt chest discomfort.  Given the fact that he was markedly volume overloaded, he wished to defer any procedures.  I would agree that this would not likely change his poor prognosis.

## 2020-11-05 NOTE — Assessment & Plan Note (Signed)
-   Previously has failed amiodarone due to weakness and shortness of breath

## 2020-11-05 NOTE — Assessment & Plan Note (Signed)
ATP was utilized by his defibrillator.  No shocks.  Discussed with Oda Kilts.

## 2020-11-07 ENCOUNTER — Telehealth: Payer: Self-pay | Admitting: Internal Medicine

## 2020-11-07 NOTE — Telephone Encounter (Signed)
See below

## 2020-11-07 NOTE — Telephone Encounter (Signed)
promethazine-dextromethorphan (PROMETHAZINE-DM) 6.25-15 MG/5ML syrup  Pt has called this cough medicine does not work well for him, wondering if he can get a different cough medication.  Please advise

## 2020-11-08 MED ORDER — BENZONATATE 200 MG PO CAPS: 200.0000 mg | ORAL_CAPSULE | Freq: Three times a day (TID) | ORAL | 0 refills | Status: DC | PRN

## 2020-11-08 NOTE — Addendum Note (Signed)
Addended by: Pricilla Holm A on: 11/08/2020 07:16 AM   Modules accepted: Orders

## 2020-11-08 NOTE — Telephone Encounter (Signed)
Sent in an alternative for cough.

## 2020-11-11 ENCOUNTER — Telehealth: Payer: Self-pay | Admitting: Internal Medicine

## 2020-11-11 NOTE — Telephone Encounter (Signed)
Okay to provide note. [name] is caring for patient.

## 2020-11-11 NOTE — Telephone Encounter (Signed)
Daughter is requesting LOA note from provider advising that she is currently taking care of patient after stroke to provide to her college

## 2020-11-12 ENCOUNTER — Encounter: Payer: Self-pay | Admitting: Internal Medicine

## 2020-11-12 ENCOUNTER — Ambulatory Visit (INDEPENDENT_AMBULATORY_CARE_PROVIDER_SITE_OTHER): Payer: Medicare Other | Admitting: Internal Medicine

## 2020-11-12 ENCOUNTER — Other Ambulatory Visit: Payer: Self-pay

## 2020-11-12 VITALS — BP 120/80 | HR 72 | Resp 18 | Ht 69.0 in | Wt 136.0 lb

## 2020-11-12 DIAGNOSIS — Z8673 Personal history of transient ischemic attack (TIA), and cerebral infarction without residual deficits: Secondary | ICD-10-CM

## 2020-11-12 DIAGNOSIS — R443 Hallucinations, unspecified: Secondary | ICD-10-CM

## 2020-11-12 DIAGNOSIS — I5022 Chronic systolic (congestive) heart failure: Secondary | ICD-10-CM | POA: Diagnosis not present

## 2020-11-12 DIAGNOSIS — R053 Chronic cough: Secondary | ICD-10-CM | POA: Diagnosis not present

## 2020-11-12 MED ORDER — BACLOFEN 10 MG PO TABS: 10.0000 mg | ORAL_TABLET | Freq: Two times a day (BID) | ORAL | 2 refills | Status: DC | PRN

## 2020-11-12 NOTE — Telephone Encounter (Signed)
[name] received the note during Mark Clayton visit today.

## 2020-11-12 NOTE — Assessment & Plan Note (Signed)
Referral to palliative care done today. He has had some progressive problems with fluid accumulation and is doing lasix BID prn depending on fluid status. His caregiver [name] helps manage medications. Edema present on exam but lungs sound clear.

## 2020-11-12 NOTE — Progress Notes (Signed)
   Subjective:   Patient ID: Mark Clayton, male    DOB: 03-Jan-1941, 80 y.o.   MRN: [id number]  HPI The patient is a 80 YO man coming in for several concerns related to his ongoing health and care. Wants to discuss EEG done by neurology and muscle tightness/pain in the right leg since stroke and overall mental health and chronic cough.  Review of Systems  Constitutional:  Positive for activity change, appetite change and fatigue.  HENT: Negative.    Eyes: Negative.   Respiratory:  Positive for cough. Negative for chest tightness and shortness of breath.   Cardiovascular:  Positive for leg swelling. Negative for chest pain and palpitations.  Gastrointestinal:  Negative for abdominal distention, abdominal pain, constipation, diarrhea, nausea and vomiting.  Musculoskeletal:  Positive for arthralgias, gait problem and myalgias.  Skin: Negative.   Neurological:  Positive for weakness.  Psychiatric/Behavioral:  Positive for decreased concentration and sleep disturbance. The patient is nervous/anxious.    Objective:  Physical Exam Constitutional:      Appearance: He is well-developed.     Comments: Chronically ill appearing  HENT:     Head: Normocephalic and atraumatic.  Cardiovascular:     Rate and Rhythm: Normal rate and regular rhythm.  Pulmonary:     Effort: Pulmonary effort is normal. No respiratory distress.     Breath sounds: Normal breath sounds. No wheezing or rales.  Abdominal:     General: Bowel sounds are normal. There is no distension.     Palpations: Abdomen is soft.     Tenderness: There is no abdominal tenderness. There is no rebound.  Musculoskeletal:     Cervical back: Normal range of motion.     Right lower leg: Edema present.     Left lower leg: Edema present.     Comments: 2+ pitting edema to the knees bilaterally  Skin:    General: Skin is warm and dry.  Neurological:     Mental Status: He is alert and oriented to person, place, and time.     Cranial Nerves:  Cranial nerve deficit present.     Motor: Weakness present.     Coordination: Coordination abnormal.     Gait: Gait abnormal.     Comments: Wandering historian and concentration/memory deficits    Vitals:   11/12/20 1027  BP: 120/80  Pulse: 72  Resp: 18  SpO2: 96%  Weight: 136 lb (61.7 kg)  Height: '5\' 9"'$  (1.753 m)    This visit occurred during the SARS-CoV-2 public health emergency.  Safety protocols were in place, including screening questions prior to the visit, additional usage of staff PPE, and extensive cleaning of exam room while observing appropriate contact time as indicated for disinfecting solutions.   Assessment & Plan:  Visit time 30 minutes in face to face communication with patient and coordination of care, additional 15 minutes spent in record review, coordination or care, ordering tests, communicating/referring to other healthcare professionals, documenting in medical records all on the same day of the visit for total time 45 minutes spent on the visit.

## 2020-11-12 NOTE — Assessment & Plan Note (Signed)
Referral to palliative care and this has been very limiting on his life. Rx baclofen for some muscle spasticity in the right leg. He is able to walk a few steps with walker but mostly in wheelchair.

## 2020-11-12 NOTE — Assessment & Plan Note (Signed)
He is following up with neurology at the end of the month and we talked about EEG results being abnormal but no documented clear seizure during 1 hour tracing. This does not mean he is not having seizures and this does show the changes in the brain waves since the stroke.

## 2020-11-12 NOTE — Assessment & Plan Note (Signed)
Suspect related to underlying heart failure. He did have worsening hallucinations due to tessalon perles. Advised to use delsym otc or cough lozenges to help instead.

## 2020-11-12 NOTE — Patient Instructions (Addendum)
We have sent in a muscle relaxer to use for the pain in the leg called baclofen.   It is okay to use the delsym for the cough.  We will wait and see what the neurologist thinks.

## 2020-11-13 ENCOUNTER — Inpatient Hospital Stay (HOSPITAL_COMMUNITY)
Admission: EM | Admit: 2020-11-13 | Discharge: 2020-11-21 | DRG: 291 | Disposition: A | Discharge status: Skilled Nursing Facility | Payer: Medicare Other | Attending: Internal Medicine | Admitting: Internal Medicine

## 2020-11-13 ENCOUNTER — Other Ambulatory Visit: Payer: Self-pay

## 2020-11-13 ENCOUNTER — Emergency Department (HOSPITAL_COMMUNITY): Payer: Medicare Other

## 2020-11-13 ENCOUNTER — Encounter (HOSPITAL_COMMUNITY): Payer: Self-pay | Admitting: Emergency Medicine

## 2020-11-13 DIAGNOSIS — I509 Heart failure, unspecified: Secondary | ICD-10-CM

## 2020-11-13 DIAGNOSIS — R5383 Other fatigue: Secondary | ICD-10-CM | POA: Diagnosis not present

## 2020-11-13 DIAGNOSIS — R443 Hallucinations, unspecified: Secondary | ICD-10-CM

## 2020-11-13 DIAGNOSIS — R2990 Unspecified symptoms and signs involving the nervous system: Secondary | ICD-10-CM | POA: Diagnosis present

## 2020-11-13 DIAGNOSIS — I252 Old myocardial infarction: Secondary | ICD-10-CM

## 2020-11-13 DIAGNOSIS — H353210 Exudative age-related macular degeneration, right eye, stage unspecified: Secondary | ICD-10-CM | POA: Diagnosis present

## 2020-11-13 DIAGNOSIS — I255 Ischemic cardiomyopathy: Secondary | ICD-10-CM | POA: Diagnosis present

## 2020-11-13 DIAGNOSIS — R531 Weakness: Secondary | ICD-10-CM | POA: Diagnosis not present

## 2020-11-13 DIAGNOSIS — I5084 End stage heart failure: Secondary | ICD-10-CM | POA: Diagnosis not present

## 2020-11-13 DIAGNOSIS — G9341 Metabolic encephalopathy: Secondary | ICD-10-CM | POA: Diagnosis not present

## 2020-11-13 DIAGNOSIS — I10 Essential (primary) hypertension: Secondary | ICD-10-CM | POA: Diagnosis present

## 2020-11-13 DIAGNOSIS — Z515 Encounter for palliative care: Secondary | ICD-10-CM

## 2020-11-13 DIAGNOSIS — I69320 Aphasia following cerebral infarction: Secondary | ICD-10-CM | POA: Diagnosis not present

## 2020-11-13 DIAGNOSIS — I639 Cerebral infarction, unspecified: Secondary | ICD-10-CM | POA: Diagnosis not present

## 2020-11-13 DIAGNOSIS — R7303 Prediabetes: Secondary | ICD-10-CM | POA: Diagnosis present

## 2020-11-13 DIAGNOSIS — J90 Pleural effusion, not elsewhere classified: Secondary | ICD-10-CM | POA: Diagnosis not present

## 2020-11-13 DIAGNOSIS — I69351 Hemiplegia and hemiparesis following cerebral infarction affecting right dominant side: Secondary | ICD-10-CM | POA: Diagnosis not present

## 2020-11-13 DIAGNOSIS — I499 Cardiac arrhythmia, unspecified: Secondary | ICD-10-CM | POA: Diagnosis not present

## 2020-11-13 DIAGNOSIS — Z66 Do not resuscitate: Secondary | ICD-10-CM | POA: Diagnosis present

## 2020-11-13 DIAGNOSIS — I2510 Atherosclerotic heart disease of native coronary artery without angina pectoris: Secondary | ICD-10-CM | POA: Diagnosis present

## 2020-11-13 DIAGNOSIS — I5022 Chronic systolic (congestive) heart failure: Secondary | ICD-10-CM | POA: Diagnosis not present

## 2020-11-13 DIAGNOSIS — I4819 Other persistent atrial fibrillation: Secondary | ICD-10-CM

## 2020-11-13 DIAGNOSIS — Z888 Allergy status to other drugs, medicaments and biological substances status: Secondary | ICD-10-CM | POA: Diagnosis not present

## 2020-11-13 DIAGNOSIS — R1312 Dysphagia, oropharyngeal phase: Secondary | ICD-10-CM | POA: Diagnosis not present

## 2020-11-13 DIAGNOSIS — D509 Iron deficiency anemia, unspecified: Secondary | ICD-10-CM | POA: Diagnosis present

## 2020-11-13 DIAGNOSIS — Z951 Presence of aortocoronary bypass graft: Secondary | ICD-10-CM

## 2020-11-13 DIAGNOSIS — N390 Urinary tract infection, site not specified: Secondary | ICD-10-CM | POA: Diagnosis not present

## 2020-11-13 DIAGNOSIS — Z85038 Personal history of other malignant neoplasm of large intestine: Secondary | ICD-10-CM

## 2020-11-13 DIAGNOSIS — Z83438 Family history of other disorder of lipoprotein metabolism and other lipidemia: Secondary | ICD-10-CM

## 2020-11-13 DIAGNOSIS — Z20822 Contact with and (suspected) exposure to covid-19: Secondary | ICD-10-CM | POA: Diagnosis present

## 2020-11-13 DIAGNOSIS — Z7189 Other specified counseling: Secondary | ICD-10-CM

## 2020-11-13 DIAGNOSIS — Z91048 Other nonmedicinal substance allergy status: Secondary | ICD-10-CM | POA: Diagnosis not present

## 2020-11-13 DIAGNOSIS — Z91040 Latex allergy status: Secondary | ICD-10-CM

## 2020-11-13 DIAGNOSIS — N3000 Acute cystitis without hematuria: Secondary | ICD-10-CM | POA: Diagnosis not present

## 2020-11-13 DIAGNOSIS — I4821 Permanent atrial fibrillation: Secondary | ICD-10-CM | POA: Diagnosis not present

## 2020-11-13 DIAGNOSIS — I110 Hypertensive heart disease with heart failure: Principal | ICD-10-CM | POA: Diagnosis present

## 2020-11-13 DIAGNOSIS — Z95810 Presence of automatic (implantable) cardiac defibrillator: Secondary | ICD-10-CM

## 2020-11-13 DIAGNOSIS — Z955 Presence of coronary angioplasty implant and graft: Secondary | ICD-10-CM

## 2020-11-13 DIAGNOSIS — R6889 Other general symptoms and signs: Secondary | ICD-10-CM | POA: Diagnosis not present

## 2020-11-13 DIAGNOSIS — H353221 Exudative age-related macular degeneration, left eye, with active choroidal neovascularization: Secondary | ICD-10-CM | POA: Diagnosis present

## 2020-11-13 DIAGNOSIS — Z79899 Other long term (current) drug therapy: Secondary | ICD-10-CM

## 2020-11-13 DIAGNOSIS — Z7901 Long term (current) use of anticoagulants: Secondary | ICD-10-CM | POA: Diagnosis not present

## 2020-11-13 DIAGNOSIS — Z7902 Long term (current) use of antithrombotics/antiplatelets: Secondary | ICD-10-CM

## 2020-11-13 DIAGNOSIS — K219 Gastro-esophageal reflux disease without esophagitis: Secondary | ICD-10-CM | POA: Diagnosis present

## 2020-11-13 DIAGNOSIS — R471 Dysarthria and anarthria: Secondary | ICD-10-CM | POA: Diagnosis present

## 2020-11-13 DIAGNOSIS — Z7401 Bed confinement status: Secondary | ICD-10-CM | POA: Diagnosis not present

## 2020-11-13 DIAGNOSIS — R2689 Other abnormalities of gait and mobility: Secondary | ICD-10-CM | POA: Diagnosis not present

## 2020-11-13 DIAGNOSIS — Z23 Encounter for immunization: Secondary | ICD-10-CM | POA: Diagnosis not present

## 2020-11-13 DIAGNOSIS — I5023 Acute on chronic systolic (congestive) heart failure: Secondary | ICD-10-CM | POA: Diagnosis present

## 2020-11-13 DIAGNOSIS — Z743 Need for continuous supervision: Secondary | ICD-10-CM | POA: Diagnosis not present

## 2020-11-13 DIAGNOSIS — Z8249 Family history of ischemic heart disease and other diseases of the circulatory system: Secondary | ICD-10-CM

## 2020-11-13 DIAGNOSIS — I517 Cardiomegaly: Secondary | ICD-10-CM | POA: Diagnosis not present

## 2020-11-13 DIAGNOSIS — M6281 Muscle weakness (generalized): Secondary | ICD-10-CM | POA: Diagnosis not present

## 2020-11-13 DIAGNOSIS — Z993 Dependence on wheelchair: Secondary | ICD-10-CM

## 2020-11-13 DIAGNOSIS — E785 Hyperlipidemia, unspecified: Secondary | ICD-10-CM | POA: Diagnosis present

## 2020-11-13 LAB — COMPREHENSIVE METABOLIC PANEL
ALT: 14 U/L (ref 0–44)
AST: 26 U/L (ref 15–41)
Albumin: 2.8 g/dL — ABNORMAL LOW (ref 3.5–5.0)
Alkaline Phosphatase: 62 U/L (ref 38–126)
Anion gap: 8 (ref 5–15)
BUN: 31 mg/dL — ABNORMAL HIGH (ref 8–23)
CO2: 20 mmol/L — ABNORMAL LOW (ref 22–32)
Calcium: 8 mg/dL — ABNORMAL LOW (ref 8.9–10.3)
Chloride: 108 mmol/L (ref 98–111)
Creatinine, Ser: 1.17 mg/dL (ref 0.61–1.24)
GFR, Estimated: >60 mL/min (ref >60)
Glucose, Bld: 104 mg/dL — ABNORMAL HIGH (ref 70–99)
Potassium: 3.9 mmol/L (ref 3.5–5.1)
Sodium: 136 mmol/L (ref 135–145)
Total Bilirubin: 2.1 mg/dL — ABNORMAL HIGH (ref 0.3–1.2)
Total Protein: 5.1 g/dL — ABNORMAL LOW (ref 6.5–8.1)

## 2020-11-13 LAB — RESP PANEL BY RT-PCR (FLU A&B, COVID) ARPGX2
Influenza A by PCR: NEGATIVE
Influenza B by PCR: NEGATIVE
SARS Coronavirus 2 by RT PCR: NEGATIVE

## 2020-11-13 LAB — URINALYSIS, ROUTINE W REFLEX MICROSCOPIC
Bilirubin Urine: NEGATIVE
Glucose, UA: NEGATIVE mg/dL
Ketones, ur: NEGATIVE mg/dL
Nitrite: POSITIVE — AB
Protein, ur: NEGATIVE mg/dL
Specific Gravity, Urine: 1.01 (ref 1.005–1.030)
pH: 5 (ref 5.0–8.0)

## 2020-11-13 LAB — CBC WITH DIFFERENTIAL/PLATELET
Abs Immature Granulocytes: 0.02 10*3/uL (ref 0.00–0.07)
Basophils Absolute: 0 10*3/uL (ref 0.0–0.1)
Basophils Relative: 1 %
Eosinophils Absolute: 0.2 10*3/uL (ref 0.0–0.5)
Eosinophils Relative: 4 %
HCT: 29.9 % — ABNORMAL LOW (ref 39.0–52.0)
Hemoglobin: 8.5 g/dL — ABNORMAL LOW (ref 13.0–17.0)
Immature Granulocytes: 1 %
Lymphocytes Relative: 17 %
Lymphs Abs: 0.7 10*3/uL (ref 0.7–4.0)
MCH: 26.1 pg (ref 26.0–34.0)
MCHC: 28.4 g/dL — ABNORMAL LOW (ref 30.0–36.0)
MCV: 91.7 fL (ref 80.0–100.0)
Monocytes Absolute: 0.4 10*3/uL (ref 0.1–1.0)
Monocytes Relative: 10 %
Neutro Abs: 2.7 10*3/uL (ref 1.7–7.7)
Neutrophils Relative %: 67 %
Platelets: 215 10*3/uL (ref 150–400)
RBC: 3.26 MIL/uL — ABNORMAL LOW (ref 4.22–5.81)
RDW: 17 % — ABNORMAL HIGH (ref 11.5–15.5)
WBC: 4 10*3/uL (ref 4.0–10.5)
nRBC: 0 % (ref 0.0–0.2)

## 2020-11-13 LAB — TROPONIN I (HIGH SENSITIVITY)
Troponin I (High Sensitivity): 17 ng/L (ref <18)
Troponin I (High Sensitivity): 15 ng/L (ref <18)

## 2020-11-13 LAB — MAGNESIUM: Magnesium: 1.8 mg/dL (ref 1.7–2.4)

## 2020-11-13 MED ORDER — STROKE: EARLY STAGES OF RECOVERY BOOK
Freq: Once | Status: DC
  Filled 2020-11-13: qty 1

## 2020-11-13 MED ORDER — FUROSEMIDE 40 MG PO TABS
40.0000 mg | ORAL_TABLET | Freq: Every day | ORAL | Status: DC
  Administered 2020-11-14: 40 mg via ORAL
  Filled 2020-11-13: qty 1

## 2020-11-13 MED ORDER — RIVAROXABAN 20 MG PO TABS: 20.0000 mg | ORAL_TABLET | Freq: Every day | ORAL | Status: DC

## 2020-11-13 MED ORDER — ACETAMINOPHEN 650 MG RE SUPP: 650.0000 mg | RECTAL | Status: DC | PRN

## 2020-11-13 MED ORDER — CLOPIDOGREL BISULFATE 75 MG PO TABS
75.0000 mg | ORAL_TABLET | Freq: Every day | ORAL | Status: DC
  Administered 2020-11-14: 75 mg via ORAL
  Filled 2020-11-13: qty 1

## 2020-11-13 MED ORDER — ACETAMINOPHEN 160 MG/5ML PO SOLN: 650.0000 mg | ORAL | Status: DC | PRN

## 2020-11-13 MED ORDER — PANTOPRAZOLE SODIUM 40 MG PO TBEC
40.0000 mg | DELAYED_RELEASE_TABLET | Freq: Every day | ORAL | Status: DC
  Administered 2020-11-14 – 2020-11-21 (×8): 40 mg via ORAL
  Filled 2020-11-13 (×8): qty 1

## 2020-11-13 MED ORDER — ACETAMINOPHEN 325 MG PO TABS: 650.0000 mg | ORAL_TABLET | ORAL | Status: DC | PRN

## 2020-11-13 MED ORDER — SODIUM CHLORIDE 0.9 % IV SOLN
500.0000 mg | Freq: Once | INTRAVENOUS | Status: AC
  Administered 2020-11-13: 500 mg via INTRAVENOUS
  Filled 2020-11-13: qty 500

## 2020-11-13 MED ORDER — CARVEDILOL 6.25 MG PO TABS
6.2500 mg | ORAL_TABLET | Freq: Two times a day (BID) | ORAL | Status: DC
  Administered 2020-11-14 – 2020-11-21 (×12): 6.25 mg via ORAL
  Filled 2020-11-13 (×14): qty 1

## 2020-11-13 MED ORDER — POLYETHYLENE GLYCOL 3350 17 G PO PACK: 17.0000 g | PACK | Freq: Every day | ORAL | Status: DC | PRN

## 2020-11-13 MED ORDER — SODIUM CHLORIDE 0.9 % IV SOLN
1.0000 g | INTRAVENOUS | Status: DC
  Administered 2020-11-14 – 2020-11-15 (×2): 1 g via INTRAVENOUS
  Filled 2020-11-13 (×2): qty 10

## 2020-11-13 MED ORDER — SODIUM CHLORIDE 0.9 % IV SOLN
1.0000 g | Freq: Once | INTRAVENOUS | Status: AC
  Administered 2020-11-13: 1 g via INTRAVENOUS
  Filled 2020-11-13: qty 10

## 2020-11-13 MED ORDER — POTASSIUM CHLORIDE CRYS ER 20 MEQ PO TBCR
20.0000 meq | EXTENDED_RELEASE_TABLET | Freq: Every day | ORAL | Status: DC
  Administered 2020-11-14 – 2020-11-20 (×7): 20 meq via ORAL
  Filled 2020-11-13 (×6): qty 1
  Filled 2020-11-13: qty 2

## 2020-11-13 MED ORDER — ATORVASTATIN CALCIUM 80 MG PO TABS
80.0000 mg | ORAL_TABLET | Freq: Every day | ORAL | Status: DC
  Administered 2020-11-14 – 2020-11-21 (×8): 80 mg via ORAL
  Filled 2020-11-13 (×8): qty 1

## 2020-11-13 MED ORDER — VITAMIN B-12 100 MCG PO TABS
100.0000 ug | ORAL_TABLET | Freq: Every day | ORAL | Status: DC
  Administered 2020-11-14 – 2020-11-21 (×8): 100 ug via ORAL
  Filled 2020-11-13 (×8): qty 1

## 2020-11-13 MED ORDER — MECLIZINE HCL 12.5 MG PO TABS: 25.0000 mg | ORAL_TABLET | Freq: Every day | ORAL | Status: DC | PRN

## 2020-11-13 NOTE — ED Provider Notes (Signed)
I provided a substantive portion of the care of this patient.  I personally performed the entirety of the medical decision making for this encounter.  EKG Interpretation  Date/Time:  Wednesday November 13 2020 19:41:49 EDT Ventricular Rate:  87 PR Interval:  133 QRS Duration: 174 QT Interval:  441 QTC Calculation: 531 R Axis:   83 Text Interpretation: Ectopic atrial rhythm Ventricular premature complex IVCD, consider atypical LBBB No significant change since last tracing Confirmed by Lacretia Leigh ([id number]) on 11/13/2020 9:71:64 PM   80 year old male presents with generalized weakness.  Has evidence of UTI as well as possible pneumonia/CHF.  Will be started on antibiotics and admitted to the hospital service   Lacretia Leigh, MD 11/13/20 2202

## 2020-11-13 NOTE — ED Triage Notes (Signed)
BIB EMS for c/o generalized weakness since yesterday morning. Pt has hx of stroke in march, has residual right sided weakness and slurred speech.

## 2020-11-13 NOTE — H&P (Signed)
History and Physical    Mark Clayton [id number] DOB: 05/19/1940 DOA: 11/13/2020  PCP: [name], MD  Patient coming from: Home  I have personally briefly reviewed patient's old medical records in [facility]  Chief Complaint: Generalized weakness, dysarthria  HPI: Mark Clayton is a 80 y.o. male with medical history significant of prior stroke with R hemiparesis in 2021, AICD placement, CHF with EF 20-25%, HTN.  At baseline: pt uses wheelchair, assists with transfers, has fairly severe R sided weakness, RLE has spastic paresis for which his PCP just started him on baclofen yesterday for.  Pt has also had somewhat of a chronic decline in functional status over the past couple of months as documented on PCP, neurologist, and cardiologist notes.  [facility] neuro recently did a work up for possible petit-mal seizures but EEG last month was neg for seizure activity, did show generalized slowing abnormalities though.  Over the last couple of days pt with generalized weakness symptoms, unable to assist with transfers.  Pt states "I have been more out".  Increased lethargy per daughter.  Symptoms constant, nothing makes better or worse.  No pain, fever, or other complaints.   ED Course: Mild UTI.  CT head = old stroke.   Review of Systems: As per HPI, otherwise all review of systems negative.  Past Medical History:  Diagnosis Date   AICD (automatic cardioverter/defibrillator) present 01/17/2003   Medtronic Maximo 7232CX ICD, serial [id number]   Anemia 02-06-11   takes oral iron   Arthritis    hands, knees   CAD (coronary artery disease) 2003   a. h/o MI and CABG in 2003. b. s/p DES to SVG-RPDA-RPLB in 08/2014.   Cancer of sigmoid colon ([place]) 2012   a. s/p colon surgery.   Carotid bruit    Chronic systolic CHF (congestive heart failure) ([place])    a. EF 20% in 2014; b. 08/2017 Echo: EF 20-25%, diff HK, Gr3 DD. Triv AI. Mod MR. Sev dil LA. Mildly dil RV w/ mildly  reduced RV fxn. Mildly dil RA. Mod TR. PASP 46mg.   Cough    Exudative age-related macular degeneration of left eye with active choroidal neovascularization ([facility] 07/12/2019   Exudative age-related macular degeneration, right eye, with inactive choroidal neovascularization ([facility] 07/12/2019   GERD (gastroesophageal reflux disease) 02-06-11   HTN (hypertension)    Hyperlipidemia    Ischemic cardiomyopathy    a. EF 20% in 2014. (Master study EF >20%); b. 08/2017 Echo: EF 20-25%, diff HK. Gr3 DD.   LV (left ventricular) mural thrombus    a. 12/2012 Echo: EF 20% with mural thrombus No evidence of thrombus on 08/2017 echo.   Macular degeneration    [place]ocardial infarct ([facility] [place]    2003   PAF (paroxysmal atrial fibrillation) (HCC)    a. CHA2DS2VASc = 5-->Xarelto/Tikosyn.   Ventricular tachycardia ([facility] 11/05/2020    Past Surgical History:  Procedure Laterality Date   CARDIAC CATHETERIZATION N/A 09/20/2014   Procedure: Left Heart Cath and Coronary Angiography;  Surgeon: JJettie Booze MD; LAD 95%, D1 100%, CFX liner percent, OM 200%, OM 390%, RCA 90%, LIMA-LAD okay, SVG-OM 2-OM 3 minimal disease, SVG-RPDA-RPLB 100% between the RPDA and RPL      CARDIAC CATHETERIZATION N/A 09/20/2014   Procedure: Coronary Stent Intervention;  Surgeon: JJettie Booze MD; Synergy DES 4 x 24 mm reducing the stenosis to 5%    CARDIAC DEFIBRILLATOR PLACEMENT  01/17/03   6949 lead. Medtronic. remote-no; with later revision  CARDIOVERSION N/A 05/22/2016   Procedure: CARDIOVERSION;  Surgeon: Lelon Perla, MD;  Location: [facility] ENDOSCOPY;  Service: Cardiovascular;  Laterality: N/A;   CARDIOVERSION N/A 08/10/2017   Procedure: CARDIOVERSION;  Surgeon: Pixie Casino, MD;  Location: [facility] ENDOSCOPY;  Service: Cardiovascular;  Laterality: N/A;   CATARACT EXTRACTION W/ INTRAOCULAR LENS  IMPLANT, BILATERAL Bilateral June/-July 2009   Dr. Katy Fitch   CATARACT EXTRACTION W/PHACO Bilateral 2010   Dr. Katy Fitch   COLON RESECTION   02/09/2011   Procedure: LAPAROSCOPIC SIGMOID COLON RESECTION;  Surgeon: Pedro Earls, MD;  Location: [facility] ORS;  Service: General;  Laterality: N/A;  Laparoscopic Assisted Sigmoid Colectomy   COLON SURGERY     COLONOSCOPY  08/31/2011   Procedure: COLONOSCOPY;  Surgeon: Jerene Bears, MD;  Location: [facility] ENDOSCOPY;  Service: Gastroenterology;  Laterality: N/A;   COLONOSCOPY N/A 09/05/2012   Procedure: COLONOSCOPY;  Surgeon: Jerene Bears, MD;  Location: [facility] ENDOSCOPY;  Service: Gastroenterology;  Laterality: N/A;   COLONOSCOPY N/A 04/18/2013   Procedure: COLONOSCOPY;  Surgeon: Jerene Bears, MD;  Location: [facility] ENDOSCOPY;  Service: Gastroenterology;  Laterality: N/A;   COLONOSCOPY N/A 04/09/2014   Procedure: COLONOSCOPY;  Surgeon: Jerene Bears, MD;  Location: [facility] ENDOSCOPY;  Service: Gastroenterology;  Laterality: N/A;   COLONOSCOPY WITH PROPOFOL N/A 05/03/2017   Procedure: COLONOSCOPY WITH PROPOFOL;  Surgeon: Jerene Bears, MD;  Location: [facility] ENDOSCOPY;  Service: Gastroenterology;  Laterality: N/A;   CORONARY ANGIOPLASTY     CORONARY ARTERY BYPASS GRAFT  01/2002   LIMA-LAD, SVG-OM 2-OM 3, SVG-RPDA-RPLB   CORONARY STENT INTERVENTION N/A 11/22/2018   Procedure: CORONARY STENT INTERVENTION;  Surgeon: Nelva Bush, MD;  Location: [place] CV LAB;  Service: Cardiovascular;  Laterality: N/A;  SVG - RCA   ICD GENERATOR CHANGE  2010   Medtronic Virtuoso II VR ICD   ICD GENERATOR CHANGEOUT N/A 12/07/2018   Procedure: ICD GENERATOR CHANGEOUT;  Surgeon: Deboraha Sprang, MD;  Location: [place] CV LAB;  Service: Cardiovascular;  Laterality: N/A;   INGUINAL HERNIA REPAIR Right 2000's X 2   LAPAROSCOPIC RIGHT HEMI COLECTOMY N/A 11/04/2012   Procedure: LAPAROSCOPIC RIGHT HEMI COLECTOMY;  Surgeon: Pedro Earls, MD;  Location: [facility] ORS;  Service: General;  Laterality: N/A;   LEFT HEART CATH AND CORS/GRAFTS ANGIOGRAPHY Left 11/22/2018   Procedure: LEFT HEART CATH AND CORS/GRAFTS ANGIOGRAPHY;  Surgeon: Nelva Bush, MD;  Location: [place] CV LAB;  Service: Cardiovascular;  Laterality: Left;   TONSILLECTOMY  ~ 1950     reports that he has never smoked. He has never used smokeless tobacco. He reports current alcohol use of about 2.0 standard drinks per week. He reports that he does not use drugs.  Allergies  Allergen Reactions   Buspar [Buspirone] Other (See Comments)    Fatigue, body aches and shortness of breath    Latex Rash   Tape Rash and Other (See Comments)    USE PAPER    Family History  Problem Relation Age of Onset   Hypertension Father    Hyperlipidemia Father    Heart disease Father    Prostate cancer Father    Alzheimer's disease Mother    Hypertension Sister    Hyperlipidemia Sister    Colon cancer Neg Hx    Esophageal cancer Neg Hx    Rectal cancer Neg Hx    Stomach cancer Neg Hx      Prior to Admission medications   Medication Sig Start Date End Date Taking? Authorizing  Provider  atorvastatin (LIPITOR) 80 MG tablet TAKE 1 TABLET BY MOUTH  DAILY Patient taking differently: Take 80 mg by mouth daily. 05/03/20  Yes Jerline Pain, MD  baclofen (LIORESAL) 10 MG tablet Take 1 tablet (10 mg total) by mouth 2 (two) times daily as needed for muscle spasms. 11/12/20  Yes [name], MD  carvedilol (COREG) 6.25 MG tablet TAKE 1 TABLET BY MOUTH  TWICE DAILY WITH A MEAL Patient taking differently: Take 6.25 mg by mouth 2 (two) times daily with a meal. 06/13/20  Yes [name], MD  clopidogrel (PLAVIX) 75 MG tablet TAKE 1 TABLET BY MOUTH  DAILY 07/17/20  Yes Deboraha Sprang, MD  furosemide (LASIX) 40 MG tablet Take 1.5 tablets (60 mg total) by mouth 2 (two) times daily. Patient taking differently: Take 40 mg by mouth daily. 11/05/20  Yes Jerline Pain, MD  isosorbide mononitrate (IMDUR) 30 MG 24 hr tablet Take 1 tablet (30 mg total) by mouth daily. 04/24/20  Yes Jerline Pain, MD  losartan (COZAAR) 50 MG tablet TAKE 1 TABLET BY MOUTH  DAILY Patient  taking differently: Take 50 mg by mouth daily. 07/17/20  Yes [name], MD  meclizine (ANTIVERT) 25 MG tablet Take 1 tablet (25 mg total) by mouth daily as needed for dizziness. 06/25/20  Yes [name], MD  nitroGLYCERIN (NITROSTAT) 0.4 MG SL tablet DISSOLVE 1 TABLET UNDER THE TONGUE EVERY 5 MINUTES AS  NEEDED FOR CHEST PAIN. MAX  OF 3 TABLETS IN 15 MINUTES. CALL 911 IF PAIN PERSISTS. Patient taking differently: Place 0.4 mg under the tongue every 5 (five) minutes as needed for chest pain. 01/26/20  Yes Deboraha Sprang, MD  pantoprazole (PROTONIX) 40 MG tablet TAKE 1 TABLET BY MOUTH  DAILY Patient taking differently: Take 40 mg by mouth daily. 07/17/20  Yes [name], MD  polyethylene glycol (MIRALAX / GLYCOLAX) 17 g packet Take 17 g by mouth daily as needed for mild constipation.   Yes [provider]  potassium chloride (KLOR-CON) 10 MEQ tablet Take 20 mEq by mouth daily.   Yes [provider]  rivaroxaban (XARELTO) 20 MG TABS tablet Take 1 tablet (20 mg total) by mouth daily with supper. 04/25/20  Yes Jerline Pain, MD  vitamin B-12 (CYANOCOBALAMIN) 100 MCG tablet Take 100 mcg by mouth daily.    Yes [provider]    Physical Exam: Vitals:   11/13/20 2030 11/13/20 2108 11/13/20 2130 11/13/20 2200  BP: 123/78 113/66 112/69 123/83  Pulse: 91 83 76 87  Resp: '17 16 19 17  '$ Temp:      SpO2: 98% 98% 99% 100%  Weight:      Height:        Constitutional: Sleepy, wakes up to voice. Eyes: PERRL, lids and conjunctivae normal ENMT: Mucous membranes are moist. Posterior pharynx clear of any exudate or lesions.Normal dentition.  Neck: normal, supple, no masses, no thyromegaly Respiratory: clear to auscultation bilaterally, no wheezing, no crackles. Normal respiratory effort. No accessory muscle use.  Cardiovascular: Irr irr, no murmurs / rubs / gallops. 2+ BLE edema 2+ pedal pulses. No carotid bruits.  Abdomen: no tenderness, no masses  palpated. No hepatosplenomegaly. Bowel sounds positive.  Musculoskeletal: no clubbing / cyanosis. No joint deformity upper and lower extremities. Good ROM, no contractures. Normal muscle tone.  Skin: no rashes, lesions, ulcers. No induration Neurologic: R sided weakness, has some speech difficulty. Psychiatric: Normal judgment and insight. Alert and oriented  x 3. Normal mood.    Labs on Admission: I have personally reviewed following labs and imaging studies  CBC: Recent Labs  Lab 11/13/20 2008  WBC 4.0  NEUTROABS 2.7  HGB 8.5*  HCT 29.9*  MCV 91.7  PLT 123456   Basic Metabolic Panel: Recent Labs  Lab 11/13/20 2008  NA 136  K 3.9  CL 108  CO2 20*  GLUCOSE 104*  BUN 31*  CREATININE 1.17  CALCIUM 8.0*  MG 1.8   GFR: Estimated Creatinine Clearance: 44.7 mL/min (by C-G formula based on SCr of 1.17 mg/dL). Liver Function Tests: Recent Labs  Lab 11/13/20 2008  AST 26  ALT 14  ALKPHOS 62  BILITOT 2.1*  PROT 5.1*  ALBUMIN 2.8*   No results for input(s): LIPASE, AMYLASE in the last 168 hours. No results for input(s): AMMONIA in the last 168 hours. Coagulation Profile: No results for input(s): INR, PROTIME in the last 168 hours. Cardiac Enzymes: No results for input(s): CKTOTAL, CKMB, CKMBINDEX, TROPONINI in the last 168 hours. BNP (last 3 results) No results for input(s): PROBNP in the last 8760 hours. HbA1C: No results for input(s): HGBA1C in the last 72 hours. CBG: No results for input(s): GLUCAP in the last 168 hours. Lipid Profile: No results for input(s): CHOL, HDL, LDLCALC, TRIG, CHOLHDL, LDLDIRECT in the last 72 hours. Thyroid Function Tests: No results for input(s): TSH, T4TOTAL, FREET4, T3FREE, THYROIDAB in the last 72 hours. Anemia Panel: No results for input(s): VITAMINB12, FOLATE, FERRITIN, TIBC, IRON, RETICCTPCT in the last 72 hours. Urine analysis:    Component Value Date/Time   COLORURINE YELLOW 11/13/2020 2027   APPEARANCEUR CLEAR 11/13/2020  2027   LABSPEC 1.010 11/13/2020 2027   PHURINE 5.0 11/13/2020 2027   GLUCOSEU NEGATIVE 11/13/2020 2027   HGBUR SMALL (A) 11/13/2020 2027   BILIRUBINUR NEGATIVE 11/13/2020 2027   KETONESUR NEGATIVE 11/13/2020 2027   PROTEINUR NEGATIVE 11/13/2020 2027   UROBILINOGEN 1.0 12/11/2008 1150   NITRITE POSITIVE (A) 11/13/2020 2027   LEUKOCYTESUR SMALL (A) 11/13/2020 2027    Radiological Exams on Admission: DG Chest 2 View  Result Date: 11/13/2020 CLINICAL DATA:  Weakness EXAM: CHEST - 2 VIEW COMPARISON:  09/26/2020 FINDINGS: Post sternotomy changes. Left-sided pacing device as before. Mild cardiomegaly with small moderate left pleural effusion and mild airspace disease. Aortic atherosclerosis. No pneumothorax. IMPRESSION: Small moderate left pleural effusion with airspace disease at left base. Cardiomegaly Electronically Signed   By: Donavan Foil M.D.   On: 11/13/2020 20:26   CT HEAD WO CONTRAST (5MM)  Result Date: 11/13/2020 CLINICAL DATA:  Generalized weakness for 2 days EXAM: CT HEAD WITHOUT CONTRAST TECHNIQUE: Contiguous axial images were obtained from the base of the skull through the vertex without intravenous contrast. COMPARISON:  10/29/2020 FINDINGS: Brain: Mild chronic atrophic changes are noted. There are encephalomalacia changes identified in the left frontal parietal region consistent with prior infarct. Mild ex vacuo dilatation of the left lateral ventricle is seen. No acute hemorrhage, acute infarction or space-occupying mass lesion is noted. Prominent cisterna magna is noted posteriorly in the posterior fossa. Vascular: No hyperdense vessel or unexpected calcification. Skull: Normal. Negative for fracture or focal lesion. Sinuses/Orbits: No acute finding. Other: None. IMPRESSION: Stable left-sided frontal parietal infarct similar to that noted on the prior exam. Chronic atrophic changes without acute abnormality. Electronically Signed   By: Inez Catalina M.D.   On: 11/13/2020 21:09     EKG: Independently reviewed.  Assessment/Plan Principal Problem:   Stroke-like symptoms Active Problems:  Chronic systolic heart failure (HCC)   Cardiac defibrillator  MDT VVI   Persistent atrial fibrillation   Essential hypertension   Dysarthria   Hemiplegia of right dominant side as late effect of cerebral infarction (HCC)   Hallucination   Pleural effusion due to CHF (congestive heart failure) (HCC)    Stroke-like symptoms - Dysarthria and generalized weakness for past couple of days DDx = recrudescence of old stroke symptoms due to mild UTI, starting baclofen for leg spasticity, or other cause VS new stroke / extension of old stroke. CT head just shows old stroke Cant MRI brain - while AICD device itself looks to be MRI compatible (has 'MRI' in the name), the leads are probably not MRI compatible (Leads placed in 2004 and 2010). Plan: Get Neuro consult Stroke pathway Tele monitor Treat UTI with rocephin UCx pending Hold baclofen Getting ED RN swallow screen HTN - Hold BP meds and allow permissive HTN per neuro: Holding imdur and losartan Cont Coreg for rate control of A.Fib Hemiplegia of R side - Chronic following prior stroke Pt usually wheelchair bound Assists with transfers at baseline but unable to do so for past couple days. A.Fib - Cont Xarelto Cont PO rate meds Chronic systolic CHF - Cont lasix Looks like cards wanted to increase to '60mg'$  daily recently But pt hadnt increased yet Given decreased PO intake recently: will leave him at '40mg'$  daily for the moment. Holding Imdur Holding Losartan Pleural effusion - Looks chronic, was moderate in size at end of June as well. Likely related to CHF  DVT prophylaxis: Xarelto Code Status: DNR - yellow form at bedside Family Communication: No family in room Disposition Plan: TBD Consults called: Dr. Lorrin Goodell Admission status: Place in obs     Shanequia Kendrick, [place] [facility]  How to contact  the [facility] Attending or Consulting provider [place] or covering provider during after hours [place], for this patient?  Check the care team in [facility] and look for a) attending/consulting [facility] provider listed and b) the [facility] team listed Log into [email or web address]  Amion Physician Scheduling and messaging for groups and whole hospitals  On call and physician scheduling software for group practices, residents, hospitalists and other medical providers for call, clinic, rotation and shift schedules. On[employer] is a hospital-wide system for scheduling doctors and paging doctors on call. EasyPlot is for scientific plotting and data analysis.  [email or web address]  and use [place]'s universal password to access. If you do not have the password, please contact the hospital operator.  Locate the [facility] provider you are looking for under [facility] and page to a number that you can be directly reached. If you still have difficulty reaching the provider, please page the [facility] (Director on Call) for the Hospitalists listed on amion for assistance.  11/13/2020, 10:46 PM

## 2020-11-13 NOTE — ED Provider Notes (Signed)
[facility] EMERGENCY DEPARTMENT Provider Note   CSN: [id number] Arrival date & time: 11/13/20  1933     History Chief Complaint  Patient presents with   Weakness    DEQUAVIOUS OBERLANDER is a 80 y.o. male.  Patient with history of coronary artery disease, ischemic cardiomyopathy with chronic systolic heart failure, status post AICD placement, previous stroke (ICH/L parietal infarct 2021) with speech and residual right-sided weakness, on Plavix --presents to the emergency department for generalized weakness symptoms.  Patient states that "I have been more out".  Additional history obtained from the patient's daughter by telephone.  She states that over the past couple of days patient has been more lethargic.  It has been difficult to get him to transfer from bed to wheelchair.  He has been eating less and been more confused.  He has been having trouble using utensils to feed himself and his speech is more dysarthric than normal.  He lives with a family member who was having trouble getting him to sleep very early this morning.  No infectious symptoms noted.  Patient has had worsening swelling extending to his abdomen over the past several weeks, improved with diet.  Cardiology notes also note increase in Lasix dosage recently to 60 mg.  Patient denies any active pain or other complaints. The onset of this condition was acute. The course is constant. Aggravating factors: none. Alleviating factors: none.        Past Medical History:  Diagnosis Date   AICD (automatic cardioverter/defibrillator) present 01/17/2003   Medtronic Maximo 7232CX ICD, serial [id number]   Anemia 02-06-11   takes oral iron   Arthritis    hands, knees   CAD (coronary artery disease) 2003   a. h/o MI and CABG in 2003. b. s/p DES to SVG-RPDA-RPLB in 08/2014.   Cancer of sigmoid colon ([place]) 2012   a. s/p colon surgery.   Carotid bruit    Chronic systolic CHF (congestive heart failure) ([place])    a. EF 20%  in 2014; b. 08/2017 Echo: EF 20-25%, diff HK, Gr3 DD. Triv AI. Mod MR. Sev dil LA. Mildly dil RV w/ mildly reduced RV fxn. Mildly dil RA. Mod TR. PASP 24mg.   Cough    Exudative age-related macular degeneration of left eye with active choroidal neovascularization ([place] 07/12/2019   Exudative age-related macular degeneration, right eye, with inactive choroidal neovascularization ([place] 07/12/2019   GERD (gastroesophageal reflux disease) 02-06-11   HTN (hypertension)    Hyperlipidemia    Ischemic cardiomyopathy    a. EF 20% in 2014. (Master study EF >20%); b. 08/2017 Echo: EF 20-25%, diff HK. Gr3 DD.   LV (left ventricular) mural thrombus    a. 12/2012 Echo: EF 20% with mural thrombus No evidence of thrombus on 08/2017 echo.   Macular degeneration    Myocardial infarct ([place]    2003   PAF (paroxysmal atrial fibrillation) (HCC)    a. CHA2DS2VASc = 5-->Xarelto/Tikosyn.   Ventricular tachycardia ([place] 11/05/2020    Patient Active Problem List   Diagnosis Date Noted   Acute on chronic systolic heart failure ([place] 11/05/2020   Chronic anticoagulation 11/05/2020   Ventricular tachycardia ([place] 11/05/2020   Chronic cough 10/24/2020   Bilateral inguinal hernia 10/11/2020   Focal neurological deficit, onset greater than 24 hours 09/27/2020   Hallucination 09/27/2020   Pre-diabetes 09/27/2020   Anxiety 08/14/2020   Hemiplegia of right dominant side as late effect of cerebral infarction ([place] 07/19/2020   Low urine output  07/05/2020   Hypotension 07/05/2020   Bradycardia 04/14/2020   Exudative age-related macular degeneration of left eye with inactive choroidal neovascularization ([place]) 02/13/2020   Exudative age-related macular degeneration of right eye with active choroidal neovascularization ([place]) 07/12/2019   Intermediate stage nonexudative age-related macular degeneration of left eye 07/12/2019   Hx of completed stroke 06/14/2019   Dysarthria    Tinnitus 08/26/2018   Essential hypertension  01/21/2018   Coronary artery disease involving native heart with angina pectoris ([place]) 10/19/2017   Persistent atrial fibrillation    Routine general medical examination at a health care facility 01/10/2016   Ischemic cardiomyopathy    Personal history of colon cancer    Implantable cardioverter-defibrillator (ICD) in situ 03/09/2012   Microcytic anemia 01/12/2011   Cardiac defibrillator  MDT VVI 12/26/2010   Hyperlipidemia 123456   Chronic systolic heart failure ([place]) 11/26/2008    Past Surgical History:  Procedure Laterality Date   CARDIAC CATHETERIZATION N/A 09/20/2014   Procedure: Left Heart Cath and Coronary Angiography;  Surgeon: Jettie Booze, MD; LAD 95%, D1 100%, CFX liner percent, OM 200%, OM 390%, RCA 90%, LIMA-LAD okay, SVG-OM 2-OM 3 minimal disease, SVG-RPDA-RPLB 100% between the RPDA and RPL      CARDIAC CATHETERIZATION N/A 09/20/2014   Procedure: Coronary Stent Intervention;  Surgeon: Jettie Booze, MD; Synergy DES 4 x 24 mm reducing the stenosis to 5%    CARDIAC DEFIBRILLATOR PLACEMENT  01/17/03   6949 lead. Medtronic. remote-no; with later revision   CARDIOVERSION N/A 05/22/2016   Procedure: CARDIOVERSION;  Surgeon: Lelon Perla, MD;  Location: [facility] ENDOSCOPY;  Service: Cardiovascular;  Laterality: N/A;   CARDIOVERSION N/A 08/10/2017   Procedure: CARDIOVERSION;  Surgeon: Pixie Casino, MD;  Location: [facility] ENDOSCOPY;  Service: Cardiovascular;  Laterality: N/A;   CATARACT EXTRACTION W/ INTRAOCULAR LENS  IMPLANT, BILATERAL Bilateral June/-July 2009   Dr. Katy Fitch   CATARACT EXTRACTION W/PHACO Bilateral 2010   Dr. Katy Fitch   COLON RESECTION  02/09/2011   Procedure: LAPAROSCOPIC SIGMOID COLON RESECTION;  Surgeon: Pedro Earls, MD;  Location: [facility] ORS;  Service: General;  Laterality: N/A;  Laparoscopic Assisted Sigmoid Colectomy   COLON SURGERY     COLONOSCOPY  08/31/2011   Procedure: COLONOSCOPY;  Surgeon: Jerene Bears, MD;  Location: [facility] ENDOSCOPY;  Service:  Gastroenterology;  Laterality: N/A;   COLONOSCOPY N/A 09/05/2012   Procedure: COLONOSCOPY;  Surgeon: Jerene Bears, MD;  Location: [facility] ENDOSCOPY;  Service: Gastroenterology;  Laterality: N/A;   COLONOSCOPY N/A 04/18/2013   Procedure: COLONOSCOPY;  Surgeon: Jerene Bears, MD;  Location: [facility] ENDOSCOPY;  Service: Gastroenterology;  Laterality: N/A;   COLONOSCOPY N/A 04/09/2014   Procedure: COLONOSCOPY;  Surgeon: Jerene Bears, MD;  Location: [facility] ENDOSCOPY;  Service: Gastroenterology;  Laterality: N/A;   COLONOSCOPY WITH PROPOFOL N/A 05/03/2017   Procedure: COLONOSCOPY WITH PROPOFOL;  Surgeon: Jerene Bears, MD;  Location: [facility] ENDOSCOPY;  Service: Gastroenterology;  Laterality: N/A;   CORONARY ANGIOPLASTY     CORONARY ARTERY BYPASS GRAFT  01/2002   LIMA-LAD, SVG-OM 2-OM 3, SVG-RPDA-RPLB   CORONARY STENT INTERVENTION N/A 11/22/2018   Procedure: CORONARY STENT INTERVENTION;  Surgeon: Nelva Bush, MD;  Location: [facility] CV LAB;  Service: Cardiovascular;  Laterality: N/A;  SVG - RCA   ICD GENERATOR CHANGE  2010   Medtronic Virtuoso II VR ICD   ICD GENERATOR CHANGEOUT N/A 12/07/2018   Procedure: ICD GENERATOR CHANGEOUT;  Surgeon: Deboraha Sprang, MD;  Location: [facility] LAB;  Service:  Cardiovascular;  Laterality: N/A;   INGUINAL HERNIA REPAIR Right 2000's X 2   LAPAROSCOPIC RIGHT HEMI COLECTOMY N/A 11/04/2012   Procedure: LAPAROSCOPIC RIGHT HEMI COLECTOMY;  Surgeon: Pedro Earls, MD;  Location: [facility] ORS;  Service: General;  Laterality: N/A;   LEFT HEART CATH AND CORS/GRAFTS ANGIOGRAPHY Left 11/22/2018   Procedure: LEFT HEART CATH AND CORS/GRAFTS ANGIOGRAPHY;  Surgeon: Nelva Bush, MD;  Location: [facility] CV LAB;  Service: Cardiovascular;  Laterality: Left;   TONSILLECTOMY  ~ 1950       Family History  Problem Relation Age of Onset   Hypertension Father    Hyperlipidemia Father    Heart disease Father    Prostate cancer Father    Alzheimer's disease Mother    Hypertension Sister     Hyperlipidemia Sister    Colon cancer Neg Hx    Esophageal cancer Neg Hx    Rectal cancer Neg Hx    Stomach cancer Neg Hx     Social History   Tobacco Use   Smoking status: Never   Smokeless tobacco: Never  Vaping Use   Vaping Use: Never used  Substance Use Topics   Alcohol use: Yes    Alcohol/week: 2.0 standard drinks    Types: 2 Cans of beer per week   Drug use: No    Home Medications Prior to Admission medications   Medication Sig Start Date End Date Taking? Authorizing Provider  atorvastatin (LIPITOR) 80 MG tablet TAKE 1 TABLET BY MOUTH  DAILY Patient taking differently: Take 80 mg by mouth daily. 05/03/20  Yes Jerline Pain, MD  baclofen (LIORESAL) 10 MG tablet Take 1 tablet (10 mg total) by mouth 2 (two) times daily as needed for muscle spasms. 11/12/20  Yes [name], MD  carvedilol (COREG) 6.25 MG tablet TAKE 1 TABLET BY MOUTH  TWICE DAILY WITH A MEAL Patient taking differently: Take 6.25 mg by mouth 2 (two) times daily with a meal. 06/13/20  Yes [name], MD  clopidogrel (PLAVIX) 75 MG tablet TAKE 1 TABLET BY MOUTH  DAILY 07/17/20  Yes Deboraha Sprang, MD  furosemide (LASIX) 40 MG tablet Take 1.5 tablets (60 mg total) by mouth 2 (two) times daily. Patient taking differently: Take 40 mg by mouth daily. 11/05/20  Yes Jerline Pain, MD  isosorbide mononitrate (IMDUR) 30 MG 24 hr tablet Take 1 tablet (30 mg total) by mouth daily. 04/24/20  Yes Jerline Pain, MD  losartan (COZAAR) 50 MG tablet TAKE 1 TABLET BY MOUTH  DAILY Patient taking differently: Take 50 mg by mouth daily. 07/17/20  Yes [name], MD  meclizine (ANTIVERT) 25 MG tablet Take 1 tablet (25 mg total) by mouth daily as needed for dizziness. 06/25/20  Yes [name], MD  nitroGLYCERIN (NITROSTAT) 0.4 MG SL tablet DISSOLVE 1 TABLET UNDER THE TONGUE EVERY 5 MINUTES AS  NEEDED FOR CHEST PAIN. MAX  OF 3 TABLETS IN 15 MINUTES. CALL 911 IF PAIN PERSISTS. 01/26/20  Yes Deboraha Sprang, MD  pantoprazole (PROTONIX) 40 MG tablet TAKE 1 TABLET BY MOUTH  DAILY Patient taking differently: Take 40 mg by mouth daily. 07/17/20  Yes [name], MD  potassium chloride (KLOR-CON) 10 MEQ tablet Take 20 mEq by mouth daily.   Yes [provider]  rivaroxaban (XARELTO) 20 MG TABS tablet Take 1 tablet (20 mg total) by mouth daily with supper. 04/25/20  Yes Jerline Pain, MD  acetaminophen (TYLENOL) 325 MG tablet  Take 1-2 tablets (325-650 mg total) by mouth every 4 (four) hours as needed for mild pain. 06/15/19   Love, Ivan Anchors, PA-C  ALPRAZolam Duanne Moron) 0.25 MG tablet TAKE 1 TABLET BY MOUTH TWICE A DAY AS NEEDED FOR ANXIETY 10/11/20   [name], MD  ezetimibe (ZETIA) 10 MG tablet TAKE 1 TABLET BY MOUTH  DAILY 02/19/20   Jerline Pain, MD  polyethylene glycol (MIRALAX / GLYCOLAX) 17 g packet Take 17 g by mouth as needed.    [provider]  potassium chloride SA (KLOR-CON) 20 MEQ tablet Take 1 tablet (20 mEq total) by mouth 2 (two) times daily. Patient not taking: Reported on 11/13/2020 11/05/20   Jerline Pain, MD  promethazine-dextromethorphan (PROMETHAZINE-DM) 6.25-15 MG/5ML syrup Take 5 mLs by mouth 4 (four) times daily as needed for cough. 10/24/20   [name], MD  vitamin B-12 (CYANOCOBALAMIN) 100 MCG tablet Take 100 mcg by mouth daily.     [provider]    Allergies    Buspar [buspirone], Latex, and Tape  Review of Systems   Review of Systems  Constitutional:  Positive for fatigue. Negative for fever.  HENT:  Negative for rhinorrhea and sore throat.   Eyes:  Negative for redness.  Respiratory:  Negative for cough and shortness of breath.   Cardiovascular:  Positive for leg swelling. Negative for chest pain.  Gastrointestinal:  Negative for abdominal pain, diarrhea, nausea and vomiting.  Genitourinary:  Negative for dysuria and hematuria.  Musculoskeletal:  Negative for myalgias.  Skin:  Negative for rash.   Neurological:  Positive for speech difficulty and weakness. Negative for headaches.  Psychiatric/Behavioral:  Positive for confusion.    Physical Exam Updated Vital Signs BP 122/78   Pulse 89   Temp (!) 97.5 F (36.4 C)   Resp 17   Ht '5\' 9"'$  (1.753 m)   Wt 61.7 kg   SpO2 99%   BMI 20.09 kg/m   Physical Exam Vitals and nursing note reviewed.  Constitutional:      General: He is not in acute distress.    Appearance: He is well-developed.  HENT:     Head: Normocephalic and atraumatic.  Eyes:     General:        Right eye: No discharge.        Left eye: No discharge.     Conjunctiva/sclera: Conjunctivae normal.  Cardiovascular:     Rate and Rhythm: Normal rate.     Heart sounds: Normal heart sounds. No murmur heard. Pulmonary:     Effort: Pulmonary effort is normal.     Breath sounds: Normal breath sounds.  Abdominal:     Palpations: Abdomen is soft.     Tenderness: There is no abdominal tenderness. There is no guarding or rebound.  Musculoskeletal:     Cervical back: Normal range of motion and neck supple.     Right lower leg: Edema present.     Left lower leg: Edema present.     Comments: Patient with 2+ pitting edema up to the proximal thighs bilaterally, symmetric.  Skin:    General: Skin is warm and dry.  Neurological:     Mental Status: He is alert.     Comments: Patient with stuttering, garbled speech at times but is able to give history.  Mild right upper extremity weakness.    ED Results / Procedures / Treatments   Labs (all labs ordered are listed, but only abnormal results are displayed) Labs Reviewed  CBC WITH DIFFERENTIAL/PLATELET - Abnormal; Notable for the following components:      Result Value   RBC 3.26 (*)    Hemoglobin 8.5 (*)    HCT 29.9 (*)    MCHC 28.4 (*)    RDW 17.0 (*)    All other components within normal limits  COMPREHENSIVE METABOLIC PANEL - Abnormal; Notable for the following components:   CO2 20 (*)    Glucose, Bld 104 (*)     BUN 31 (*)    Calcium 8.0 (*)    Total Protein 5.1 (*)    Albumin 2.8 (*)    Total Bilirubin 2.1 (*)    All other components within normal limits  URINALYSIS, ROUTINE W REFLEX MICROSCOPIC - Abnormal; Notable for the following components:   Hgb urine dipstick SMALL (*)    Nitrite POSITIVE (*)    Leukocytes,Ua SMALL (*)    Bacteria, UA FEW (*)    All other components within normal limits  RESP PANEL BY RT-PCR (FLU A&B, COVID) ARPGX2  URINE CULTURE  CULTURE, BLOOD (ROUTINE X 2)  CULTURE, BLOOD (ROUTINE X 2)  MAGNESIUM  HEMOGLOBIN A1C  LIPID PANEL  TROPONIN I (HIGH SENSITIVITY)  TROPONIN I (HIGH SENSITIVITY)    EKG EKG Interpretation  Date/Time:  Wednesday November 13 2020 19:41:49 EDT Ventricular Rate:  87 PR Interval:  133 QRS Duration: 174 QT Interval:  441 QTC Calculation: 531 R Axis:   83 Text Interpretation: Ectopic atrial rhythm Ventricular premature complex IVCD, consider atypical LBBB No significant change since last tracing Confirmed by Lacretia Leigh ([id number]) on 11/13/2020 9:54:12 PM  Radiology DG Chest 2 View  Result Date: 11/13/2020 CLINICAL DATA:  Weakness EXAM: CHEST - 2 VIEW COMPARISON:  09/26/2020 FINDINGS: Post sternotomy changes. Left-sided pacing device as before. Mild cardiomegaly with small moderate left pleural effusion and mild airspace disease. Aortic atherosclerosis. No pneumothorax. IMPRESSION: Small moderate left pleural effusion with airspace disease at left base. Cardiomegaly Electronically Signed   By: Donavan Foil M.D.   On: 11/13/2020 20:26   CT HEAD WO CONTRAST (5MM)  Result Date: 11/13/2020 CLINICAL DATA:  Generalized weakness for 2 days EXAM: CT HEAD WITHOUT CONTRAST TECHNIQUE: Contiguous axial images were obtained from the base of the skull through the vertex without intravenous contrast. COMPARISON:  10/29/2020 FINDINGS: Brain: Mild chronic atrophic changes are noted. There are encephalomalacia changes identified in the left frontal parietal  region consistent with prior infarct. Mild ex vacuo dilatation of the left lateral ventricle is seen. No acute hemorrhage, acute infarction or space-occupying mass lesion is noted. Prominent cisterna magna is noted posteriorly in the posterior fossa. Vascular: No hyperdense vessel or unexpected calcification. Skull: Normal. Negative for fracture or focal lesion. Sinuses/Orbits: No acute finding. Other: None. IMPRESSION: Stable left-sided frontal parietal infarct similar to that noted on the prior exam. Chronic atrophic changes without acute abnormality. Electronically Signed   By: Inez Catalina M.D.   On: 11/13/2020 21:09    Procedures Procedures   Medications Ordered in ED Medications  azithromycin (ZITHROMAX) 500 mg in sodium chloride 0.9 % 250 mL IVPB (has no administration in time range)   stroke: mapping our early stages of recovery book (has no administration in time range)  acetaminophen (TYLENOL) tablet 650 mg (has no administration in time range)    Or  acetaminophen (TYLENOL) 160 MG/5ML solution 650 mg (has no administration in time range)    Or  acetaminophen (TYLENOL) suppository 650 mg (has no administration in time range)  cefTRIAXone (  ROCEPHIN) 1 g in sodium chloride 0.9 % 100 mL IVPB (has no administration in time range)  cefTRIAXone (ROCEPHIN) 1 g in sodium chloride 0.9 % 100 mL IVPB (0 g Intravenous Stopped 11/13/20 2253)    ED Course  I have reviewed the triage vital signs and the nursing notes.  Pertinent labs & imaging results that were available during my care of the patient were reviewed by me and considered in my medical decision making (see chart for details).  Patient seen and examined. Work-up initiated. Medications ordered. Spoke with patient's daughter [name] by phone for additional information.   Vital signs reviewed and are as follows: BP 123/83   Pulse 87   Temp (!) 97.5 F (36.4 C)   Resp 17   Ht '5\' 9"'$  (1.753 m)   Wt 61.7 kg   SpO2 100%   BMI 20.09  kg/m   Chest x-ray with questionable airspace disease and continued pleural effusion.  CT appears stable.  UA with nitrite positive UTI.  Discussed case with Dr. Alcario Drought of [facility] hospitalist.  They will admit.  He is spoken with neurology for consult given acute worsening of chronic stroke symptoms.  Patient is unable to get an MRI due to his ICD leads not being compatible.  Patient discussed with Dr. Zenia Resides who has seen patient earlier.    MDM Rules/Calculators/A&P                           Admit.  Final Clinical Impression(s) / ED Diagnoses Final diagnoses:  Stroke-like symptoms  Acute cystitis without hematuria  Acute on chronic systolic congestive heart failure [facility])    Rx / DC Orders ED Discharge Orders     None        Carlisle Cater, Hershal Coria 11/13/20 2257    Lacretia Leigh, MD 11/14/20 1346

## 2020-11-14 DIAGNOSIS — Z91048 Other nonmedicinal substance allergy status: Secondary | ICD-10-CM | POA: Diagnosis not present

## 2020-11-14 DIAGNOSIS — I509 Heart failure, unspecified: Secondary | ICD-10-CM | POA: Diagnosis not present

## 2020-11-14 DIAGNOSIS — I5023 Acute on chronic systolic (congestive) heart failure: Secondary | ICD-10-CM | POA: Diagnosis present

## 2020-11-14 DIAGNOSIS — R443 Hallucinations, unspecified: Secondary | ICD-10-CM | POA: Diagnosis present

## 2020-11-14 DIAGNOSIS — I2510 Atherosclerotic heart disease of native coronary artery without angina pectoris: Secondary | ICD-10-CM | POA: Diagnosis present

## 2020-11-14 DIAGNOSIS — I10 Essential (primary) hypertension: Secondary | ICD-10-CM

## 2020-11-14 DIAGNOSIS — Z91040 Latex allergy status: Secondary | ICD-10-CM | POA: Diagnosis not present

## 2020-11-14 DIAGNOSIS — R2990 Unspecified symptoms and signs involving the nervous system: Secondary | ICD-10-CM | POA: Diagnosis not present

## 2020-11-14 DIAGNOSIS — Z66 Do not resuscitate: Secondary | ICD-10-CM | POA: Diagnosis present

## 2020-11-14 DIAGNOSIS — G9341 Metabolic encephalopathy: Secondary | ICD-10-CM | POA: Diagnosis present

## 2020-11-14 DIAGNOSIS — D509 Iron deficiency anemia, unspecified: Secondary | ICD-10-CM | POA: Diagnosis present

## 2020-11-14 DIAGNOSIS — I252 Old myocardial infarction: Secondary | ICD-10-CM | POA: Diagnosis not present

## 2020-11-14 DIAGNOSIS — R471 Dysarthria and anarthria: Secondary | ICD-10-CM | POA: Diagnosis present

## 2020-11-14 DIAGNOSIS — N3000 Acute cystitis without hematuria: Secondary | ICD-10-CM | POA: Diagnosis present

## 2020-11-14 DIAGNOSIS — Z95810 Presence of automatic (implantable) cardiac defibrillator: Secondary | ICD-10-CM | POA: Diagnosis not present

## 2020-11-14 DIAGNOSIS — I110 Hypertensive heart disease with heart failure: Secondary | ICD-10-CM | POA: Diagnosis present

## 2020-11-14 DIAGNOSIS — Z888 Allergy status to other drugs, medicaments and biological substances status: Secondary | ICD-10-CM | POA: Diagnosis not present

## 2020-11-14 DIAGNOSIS — Z20822 Contact with and (suspected) exposure to covid-19: Secondary | ICD-10-CM | POA: Diagnosis present

## 2020-11-14 DIAGNOSIS — Z7189 Other specified counseling: Secondary | ICD-10-CM | POA: Diagnosis not present

## 2020-11-14 DIAGNOSIS — I4821 Permanent atrial fibrillation: Secondary | ICD-10-CM | POA: Diagnosis present

## 2020-11-14 DIAGNOSIS — Z515 Encounter for palliative care: Secondary | ICD-10-CM | POA: Diagnosis not present

## 2020-11-14 DIAGNOSIS — I5084 End stage heart failure: Secondary | ICD-10-CM | POA: Diagnosis present

## 2020-11-14 DIAGNOSIS — K219 Gastro-esophageal reflux disease without esophagitis: Secondary | ICD-10-CM | POA: Diagnosis present

## 2020-11-14 DIAGNOSIS — I69351 Hemiplegia and hemiparesis following cerebral infarction affecting right dominant side: Secondary | ICD-10-CM | POA: Diagnosis not present

## 2020-11-14 DIAGNOSIS — I69320 Aphasia following cerebral infarction: Secondary | ICD-10-CM | POA: Diagnosis not present

## 2020-11-14 DIAGNOSIS — Z7901 Long term (current) use of anticoagulants: Secondary | ICD-10-CM | POA: Diagnosis not present

## 2020-11-14 DIAGNOSIS — Z951 Presence of aortocoronary bypass graft: Secondary | ICD-10-CM | POA: Diagnosis not present

## 2020-11-14 DIAGNOSIS — Z23 Encounter for immunization: Secondary | ICD-10-CM | POA: Diagnosis not present

## 2020-11-14 LAB — HEMOGLOBIN A1C
Hgb A1c MFr Bld: 6.7 % — ABNORMAL HIGH (ref 4.8–5.6)
Mean Plasma Glucose: 145.59 mg/dL

## 2020-11-14 LAB — FERRITIN: Ferritin: 22 ng/mL — ABNORMAL LOW (ref 24–336)

## 2020-11-14 LAB — BASIC METABOLIC PANEL
Anion gap: 7 (ref 5–15)
BUN: 33 mg/dL — ABNORMAL HIGH (ref 8–23)
CO2: 25 mmol/L (ref 22–32)
Calcium: 8.8 mg/dL — ABNORMAL LOW (ref 8.9–10.3)
Chloride: 107 mmol/L (ref 98–111)
Creatinine, Ser: 1.35 mg/dL — ABNORMAL HIGH (ref 0.61–1.24)
GFR, Estimated: 53 mL/min — ABNORMAL LOW (ref >60)
Glucose, Bld: 122 mg/dL — ABNORMAL HIGH (ref 70–99)
Potassium: 4.2 mmol/L (ref 3.5–5.1)
Sodium: 139 mmol/L (ref 135–145)

## 2020-11-14 LAB — VITAMIN B12: Vitamin B-12: 1455 pg/mL — ABNORMAL HIGH (ref 180–914)

## 2020-11-14 LAB — AMMONIA: Ammonia: 19 umol/L (ref 9–35)

## 2020-11-14 LAB — TSH: TSH: 10.38 u[IU]/mL — ABNORMAL HIGH (ref 0.350–4.500)

## 2020-11-14 LAB — CBC
HCT: 28.4 % — ABNORMAL LOW (ref 39.0–52.0)
Hemoglobin: 8.8 g/dL — ABNORMAL LOW (ref 13.0–17.0)
MCH: 27 pg (ref 26.0–34.0)
MCHC: 31 g/dL (ref 30.0–36.0)
MCV: 87.1 fL (ref 80.0–100.0)
Platelets: 213 10*3/uL (ref 150–400)
RBC: 3.26 MIL/uL — ABNORMAL LOW (ref 4.22–5.81)
RDW: 17.2 % — ABNORMAL HIGH (ref 11.5–15.5)
WBC: 4.7 10*3/uL (ref 4.0–10.5)
nRBC: 0 % (ref 0.0–0.2)

## 2020-11-14 LAB — HIV ANTIBODY (ROUTINE TESTING W REFLEX): HIV Screen 4th Generation wRfx: NONREACTIVE

## 2020-11-14 LAB — LIPID PANEL
Cholesterol: 78 mg/dL (ref 0–200)
HDL: 31 mg/dL — ABNORMAL LOW (ref >40)
LDL Cholesterol: 36 mg/dL (ref 0–99)
Total CHOL/HDL Ratio: 2.5 RATIO
Triglycerides: 57 mg/dL (ref <150)
VLDL: 11 mg/dL (ref 0–40)

## 2020-11-14 LAB — LACTATE DEHYDROGENASE: LDH: 131 U/L (ref 98–192)

## 2020-11-14 LAB — IRON AND TIBC
Iron: 17 ug/dL — ABNORMAL LOW (ref 45–182)
Saturation Ratios: 4 % — ABNORMAL LOW (ref 17.9–39.5)
TIBC: 424 ug/dL (ref 250–450)
UIBC: 407 ug/dL

## 2020-11-14 LAB — BRAIN NATRIURETIC PEPTIDE: B Natriuretic Peptide: 1881.2 pg/mL — ABNORMAL HIGH (ref 0.0–100.0)

## 2020-11-14 MED ORDER — RIVAROXABAN 15 MG PO TABS
15.0000 mg | ORAL_TABLET | Freq: Every day | ORAL | Status: DC
  Administered 2020-11-14 – 2020-11-20 (×7): 15 mg via ORAL
  Filled 2020-11-14 (×7): qty 1

## 2020-11-14 MED ORDER — FUROSEMIDE 10 MG/ML IJ SOLN
40.0000 mg | Freq: Two times a day (BID) | INTRAMUSCULAR | Status: DC
  Administered 2020-11-14 – 2020-11-15 (×3): 40 mg via INTRAVENOUS
  Filled 2020-11-14 (×3): qty 4

## 2020-11-14 MED ORDER — LIP MEDEX EX OINT
TOPICAL_OINTMENT | CUTANEOUS | Status: DC | PRN
  Filled 2020-11-14: qty 7

## 2020-11-14 MED ORDER — SODIUM CHLORIDE 0.9 % IV SOLN
125.0000 mg | Freq: Every day | INTRAVENOUS | Status: DC
  Filled 2020-11-14: qty 10

## 2020-11-14 NOTE — Consult Note (Addendum)
NEUROLOGY CONSULTATION NOTE   Date of service: November 14, 2020 Patient Name: Mark Clayton MRN:  [id number] DOB:  Nov 15, 1940 Reason for consult: "generalized weakness, dysarthria" Requesting Provider: Etta Quill, DO _ _ _   _ __   _ __ _ _  __ __   _ __   __ _  History of Present Illness  Mark Clayton is a 80 y.o. 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  He has significant aphasia on my evaluation. He frequently gets stuck on multiple words and loses his train of thoughts. Very difficult to get an accurate history.  I attempted to contact his daughter Mark Clayton who lives in the same household but was unable to get in touch with her. The most I couldget out from my discussion with Mark Clayton is that his speech trouble is baseline since his stroke and bleed, he is tired and lethargic for a couple days. Feels weaker in his Right over the last couple days. He has not walked since he had a stroke in March 2021.  Of note, follows with neurology outpatient for episodes of starring off and quick jerks and is being evaluated for a potential absence seizure. 1 hour EEG with no seizures or epileptiform discharges.   ROS   Constitutional Denies weight loss, fever and chills.   HEENT Denies changes in vision and hearing.   Respiratory Denies SOB and cough.   CV Denies palpitations and CP   GI Denies abdominal pain, nausea, vomiting and diarrhea.   GU Denies dysuria and urinary frequency.   MSK Denies myalgia and joint pain.   Skin Denies rash and pruritus.    Neurological Denies headache and syncope.   Psychiatric Denies recent changes in mood. Denies anxiety and depression.    Past History   Past Medical History:  Diagnosis Date   AICD (automatic cardioverter/defibrillator) present 01/17/2003   Medtronic Maximo 7232CX ICD, serial [id number] S   Anemia 02-06-11   takes oral iron   Arthritis    hands, knees   CAD (coronary artery disease) 2003   a. h/o MI and CABG in 2003. b. s/p DES to SVG-RPDA-RPLB in 08/2014.   Cancer of sigmoid colon ([place]) 2012   a. s/p colon surgery.   Carotid bruit    Chronic systolic CHF (congestive heart failure) ([place])    a. EF 20% in 2014; b. 08/2017 Echo: EF 20-25%, diff HK, Gr3 DD. Triv AI. Mod MR. Sev dil LA. Mildly dil RV w/ mildly reduced RV fxn. Mildly dil RA. Mod TR. PASP 70mg.   Cough    Exudative age-related macular degeneration of left eye with active choroidal neovascularization ([facility] 07/12/2019   Exudative age-related macular degeneration, right eye, with inactive choroidal neovascularization ([facility] 07/12/2019   GERD (gastroesophageal reflux disease) 02-06-11   HTN (hypertension)    Hyperlipidemia    Ischemic cardiomyopathy    a. EF 20% in 2014. (Master study EF >20%); b. 08/2017 Echo: EF 20-25%, diff HK. Gr3 DD.   LV (left ventricular) mural thrombus    a. 12/2012 Echo: EF 20% with mural thrombus No evidence of thrombus on 08/2017 echo.  Macular degeneration    Myocardial infarct ([place])    2003   PAF (paroxysmal atrial fibrillation) ([facility])    a. CHA2DS2VASc = 5-->Xarelto/Tikosyn.   Ventricular tachycardia ([place]) 11/05/2020   Past Surgical History:  Procedure Laterality Date   CARDIAC CATHETERIZATION N/A 09/20/2014   Procedure: Left Heart Cath and Coronary Angiography;  Surgeon: Jettie Booze, MD; LAD 95%, D1 100%, CFX liner percent, OM 200%, OM 390%, RCA 90%, LIMA-LAD okay, SVG-OM 2-OM 3 minimal disease, SVG-RPDA-RPLB 100% between the RPDA and RPL      CARDIAC CATHETERIZATION N/A 09/20/2014    Procedure: Coronary Stent Intervention;  Surgeon: Jettie Booze, MD; Synergy DES 4 x 24 mm reducing the stenosis to 5%    CARDIAC DEFIBRILLATOR PLACEMENT  01/17/03   6949 lead. Medtronic. remote-no; with later revision   CARDIOVERSION N/A 05/22/2016   Procedure: CARDIOVERSION;  Surgeon: Lelon Perla, MD;  [facility] ENDOSCOPY;  Service: Cardiovascular;  Laterality: N/A;   CARDIOVERSION N/A 08/10/2017   Procedure: CARDIOVERSION;  Surgeon: Pixie Casino, MD;  Location: [facility] ENDOSCOPY;  Service: Cardiovascular;  Laterality: N/A;   CATARACT EXTRACTION W/ INTRAOCULAR LENS  IMPLANT, BILATERAL Bilateral June/-July 2009   Dr. Katy Fitch   CATARACT EXTRACTION W/PHACO Bilateral 2010   Dr. Katy Fitch   COLON RESECTION  02/09/2011   Procedure: LAPAROSCOPIC SIGMOID COLON RESECTION;  Surgeon: Pedro Earls, MD;  Location: [facility] ORS;  Service: General;  Laterality: N/A;  Laparoscopic Assisted Sigmoid Colectomy   COLON SURGERY     COLONOSCOPY  08/31/2011   Procedure: COLONOSCOPY;  Surgeon: Jerene Bears, MD;  Location: [facility] ENDOSCOPY;  Service: Gastroenterology;  Laterality: N/A;   COLONOSCOPY N/A 09/05/2012   Procedure: COLONOSCOPY;  Surgeon: Jerene Bears, MD;  Location: [facility] ENDOSCOPY;  Service: Gastroenterology;  Laterality: N/A;   COLONOSCOPY N/A 04/18/2013   Procedure: COLONOSCOPY;  Surgeon: Jerene Bears, MD;  Location: [facility] ENDOSCOPY;  Service: Gastroenterology;  Laterality: N/A;   COLONOSCOPY N/A 04/09/2014   Procedure: COLONOSCOPY;  Surgeon: Jerene Bears, MD;  Location: [facility] ENDOSCOPY;  Service: Gastroenterology;  Laterality: N/A;   COLONOSCOPY WITH PROPOFOL N/A 05/03/2017   Procedure: COLONOSCOPY WITH PROPOFOL;  Surgeon: Jerene Bears, MD;  Location: [facility] ENDOSCOPY;  Service: Gastroenterology;  Laterality: N/A;   CORONARY ANGIOPLASTY     CORONARY ARTERY BYPASS GRAFT  01/2002   LIMA-LAD, SVG-OM 2-OM 3, SVG-RPDA-RPLB   CORONARY STENT INTERVENTION N/A 11/22/2018   Procedure: CORONARY STENT INTERVENTION;  Surgeon:  Nelva Bush, MD;  Location: [place] CV LAB;  Service: Cardiovascular;  Laterality: N/A;  SVG - RCA   ICD GENERATOR CHANGE  2010   Medtronic Virtuoso II VR ICD   ICD GENERATOR CHANGEOUT N/A 12/07/2018   Procedure: ICD GENERATOR CHANGEOUT;  Surgeon: Deboraha Sprang, MD;  Location: [place] [facility];  Service: Cardiovascular;  Laterality: N/A;   INGUINAL HERNIA REPAIR Right 2000's X 2   LAPAROSCOPIC RIGHT HEMI COLECTOMY N/A 11/04/2012   Procedure: LAPAROSCOPIC RIGHT HEMI COLECTOMY;  Surgeon: Pedro Earls, MD;  Location: [facility] ORS;  Service: General;  Laterality: N/A;   LEFT HEART CATH AND CORS/GRAFTS ANGIOGRAPHY Left 11/22/2018   Procedure: LEFT HEART CATH AND CORS/GRAFTS ANGIOGRAPHY;  Surgeon: Nelva Bush, MD;  Location: [place] [facility];  Service: Cardiovascular;  Laterality: Left;   TONSILLECTOMY  ~ 1950   Family History  Problem Relation Age of Onset   Hypertension Father    Hyperlipidemia Father    Heart disease Father    Prostate cancer Father  Alzheimer's disease Mother    Hypertension Sister    Hyperlipidemia Sister    Colon cancer Neg Hx    Esophageal cancer Neg Hx    Rectal cancer Neg Hx    Stomach cancer Neg Hx    Social History   Socioeconomic History   Marital status: Widowed    Spouse name: Not on file   Number of children: 3   Years of education: Not on file   Highest education level: Not on file  Occupational History   Occupation: self employed/ Art gallery manager  Tobacco Use   Smoking status: Never   Smokeless tobacco: Never  Vaping Use   Vaping Use: Never used  Substance and Sexual Activity   Alcohol use: Yes    Alcohol/week: 2.0 standard drinks    Types: 2 Cans of beer per week   Drug use: No   Sexual activity: Not Currently  Other Topics Concern   Not on file  Social History Narrative   Full time. Married.    Right handed   Drinks caffeine   Two story home   Social Determinants of Health   Financial Resource Strain: Low Risk     Difficulty of Paying Living Expenses: Not hard at all  Food Insecurity: No Food Insecurity   Worried About [name] fundraiser in the Last Year: Never true   Arboriculturist in the Last Year: Never true  Transportation Needs: No Transportation Needs   Lack of Transportation (Medical): No   Lack of Transportation (Non-Medical): No  Physical Activity: Insufficiently Active   Days of Exercise per Week: 2 days   Minutes of Exercise per Session: 60 min  Stress: No Stress Concern Present   Feeling of Stress : Not at all  Social Connections: Not on file   Allergies  Allergen Reactions   Buspar [Buspirone] Other (See Comments)    Fatigue, body aches and shortness of breath    Latex Rash   Tape Rash and Other (See Comments)    USE PAPER    Medications   Medications Prior to Admission  Medication Sig Dispense Refill Last Dose   atorvastatin (LIPITOR) 80 MG tablet TAKE 1 TABLET BY MOUTH  DAILY (Patient taking differently: Take 80 mg by mouth daily.) 90 tablet 3 11/13/2020   baclofen (LIORESAL) 10 MG tablet Take 1 tablet (10 mg total) by mouth 2 (two) times daily as needed for muscle spasms. 60 each 2 Past Week   carvedilol (COREG) 6.25 MG tablet TAKE 1 TABLET BY MOUTH  TWICE DAILY WITH A MEAL (Patient taking differently: Take 6.25 mg by mouth 2 (two) times daily with a meal.) 180 tablet 3 11/13/2020   clopidogrel (PLAVIX) 75 MG tablet TAKE 1 TABLET BY MOUTH  DAILY 90 tablet 3 11/13/2020   furosemide (LASIX) 40 MG tablet Take 1.5 tablets (60 mg total) by mouth 2 (two) times daily. (Patient taking differently: Take 40 mg by mouth daily.) 270 tablet 3 11/13/2020   isosorbide mononitrate (IMDUR) 30 MG 24 hr tablet Take 1 tablet (30 mg total) by mouth daily. 90 tablet 3 11/13/2020   losartan (COZAAR) 50 MG tablet TAKE 1 TABLET BY MOUTH  DAILY (Patient taking differently: Take 50 mg by mouth daily.) 90 tablet 3 11/13/2020   meclizine (ANTIVERT) 25 MG tablet Take 1 tablet (25 mg total) by mouth daily as  needed for dizziness. 30 tablet 2 unknown at unknown   nitroGLYCERIN (NITROSTAT) 0.4 MG SL tablet DISSOLVE 1 TABLET UNDER THE TONGUE  EVERY 5 MINUTES AS  NEEDED FOR CHEST PAIN. MAX  OF 3 TABLETS IN 15 MINUTES. CALL 911 IF PAIN PERSISTS. (Patient taking differently: Place 0.4 mg under the tongue every 5 (five) minutes as needed for chest pain.) 100 tablet 3 unknown at unknown   pantoprazole (PROTONIX) 40 MG tablet TAKE 1 TABLET BY MOUTH  DAILY (Patient taking differently: Take 40 mg by mouth daily.) 90 tablet 3 11/13/2020   polyethylene glycol (MIRALAX / GLYCOLAX) 17 g packet Take 17 g by mouth daily as needed for mild constipation.   Past Week   potassium chloride (KLOR-CON) 10 MEQ tablet Take 20 mEq by mouth daily.   11/13/2020   rivaroxaban (XARELTO) 20 MG TABS tablet Take 1 tablet (20 mg total) by mouth daily with supper. 90 tablet 3 11/12/2020 at 2100   vitamin B-12 (CYANOCOBALAMIN) 100 MCG tablet Take 100 mcg by mouth daily.    11/13/2020     Vitals   Vitals:   11/13/20 2200 11/13/20 2300 11/13/20 2330 11/13/20 2352  BP: 123/83 125/67 108/69 93/72  Pulse: 87 88 91 87  Resp: '17 19 14 17  '$ Temp:    97.8 F (36.6 C)  TempSrc:    Oral  SpO2: 100% 100% 98% 100%  Weight:      Height:         Body mass index is 20.09 kg/m.  Physical Exam   General: Laying comfortably in bed; in no acute distress.  HENT: Normal oropharynx and mucosa. Normal external appearance of ears and nose.  Neck: Supple, no pain or tenderness  CV: No JVD. No peripheral edema.  Pulmonary: Symmetric Chest rise. Normal respiratory effort.  Abdomen: Soft to touch, non-tender.  Ext: No cyanosis, edema, or deformity  Skin: No rash. Normal palpation of skin.   Musculoskeletal: Normal digits and nails by inspection. No clubbing.   Neurologic Examination  Mental status/Cognition: Alert, oriented to self, place, month and year, good attention.  Speech/language: Non fluent, comprehension intact, object naming intact,  repetition intact.Gets stuck on several words while attempting to communicate. Cranial nerves:   CN II Pupils equal and reactive to light, no VF deficits    CN III,IV,VI EOM intact, no gaze preference or deviation, no nystagmus    CN V normal sensation in V1, V2, and V3 segments bilaterally    CN VII no asymmetry, no nasolabial fold flattening    CN VIII normal hearing to speech    CN IX & X normal palatal elevation, no uvular deviation    CN XI 5/5 head turn and 5/5 shoulder shrug bilaterally    CN XII midline tongue protrusion    Motor:  Muscle bulk: normal, tone normal, pronator drift yes RUE drift tremor none Mvmt Root Nerve  Muscle Right Left Comments  SA C5/6 Ax Deltoid 2 5   EF C5/6 Mc Biceps 4+ 5   EE C6/7/8 Rad Triceps 4+ 5   WF C6/7 Med FCR     WE C7/8 PIN ECU     F Ab C8/T1 U ADM/FDI 4+ 5   HF L1/2/3 Fem Illopsoas 3 5   KE L2/3/4 Fem Quad 4 5   DF L4/5 D [place] Tib Ant 4 5   PF S1/2 Tibial Grc/Sol 4 5    Reflexes:  Right Left Comments  Pectoralis      Biceps (C5/6) 2 2   Brachioradialis (C5/6) 2 2    Triceps (C6/7) 2 2    Patellar (L3/4) 3 2  Achilles (S1)      Hoffman      Plantar     Jaw jerk    Sensation:  Light touch Decreased in RUE and RLE   Pin prick    Temperature    Vibration   Proprioception    Coordination/Complex Motor:  - Finger to Nose intact BL - Heel to shin unable to do - Rapid alternating movement are normal - Gait: unable to walk at baseline.  Labs   CBC:  Recent Labs  Lab 11/13/20 2008  WBC 4.0  NEUTROABS 2.7  HGB 8.5*  HCT 29.9*  MCV 91.7  PLT 123456    Basic Metabolic Panel:  Lab Results  Component Value Date   NA 136 11/13/2020   K 3.9 11/13/2020   CO2 20 (L) 11/13/2020   GLUCOSE 104 (H) 11/13/2020   BUN 31 (H) 11/13/2020   CREATININE 1.17 11/13/2020   CALCIUM 8.0 (L) 11/13/2020   GFRNONAA >60 11/13/2020   GFRAA 77 05/10/2020   Lipid Panel:  Lab Results  Component Value Date   LDLCALC 60 06/07/2019    HgbA1c:  Lab Results  Component Value Date   HGBA1C 6.7 (H) 09/26/2020   Urine Drug Screen:     Component Value Date/Time   LABOPIA NONE DETECTED 06/07/2019 1400   COCAINSCRNUR NONE DETECTED 06/07/2019 1400   LABBENZ NONE DETECTED 06/07/2019 1400   AMPHETMU NONE DETECTED 06/07/2019 1400   THCU NONE DETECTED 06/07/2019 1400   LABBARB NONE DETECTED 06/07/2019 1400    Alcohol Level     Component Value Date/Time   ETH <10 06/06/2019 1549    CT Head without contrast: Personally reviewed and no acute intracranial abnormality. Notable for prior left frontoparietal infarct that is stable in size compated to prior [place] on 10/29/20.  MRI Brain: Unable to obtain 2/2 incompatible AICD leads.  Impression   Mark Clayton is a 80 y.o. 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 Aphasia significantly limits the history I could get from him and I could not get in touch with his daughter over the phone for collateral.  My suspicion for him having a new stroke is low. His current symptoms including aphasia and R sided weakness appear to be consistent with the location of his infarct and do appear to be similar to his prior stroke symptoms. I would expect CT Head to be positive after 2 days of new symptoms.  Even if he has a new stroke, I would be hesitant on adding an antiplatelet agent to his Eliquis given his prior SDH due to significantly higher risk of bleeding.  I suspect that his symptoms are likely recrudescence in the setting of a UTI, somnolence from baclofen can also  cause this. He can't get an MRI to definitively rule out a stroke. Aphasia makes it difficult get an accurate history in his case. I think a repeat CT Head in 24 hours is reasonable just to make sure that there is no new stroke.  Recommendations  - repeat CTH without contrast in 24 hours. If this is positive for an acute stroke, would recommend full stroke workup at that time. In the  meantime, continue Xarelto, permissive hypertension to [id number] systolic and Neuro checks. - treatment of UTI per primary team. ______________________________________________________________________  Plan discussed with Dr. Alcario Drought over secure chat.  Thank you for the opportunity to take part in the care of this patient. If you have any further questions, please contact the neurology consultation attending.  Signed,  [place] Pager Number [phone] _ _ _   _ __   _ __ _ _  __ __   _ __   __ _

## 2020-11-14 NOTE — Progress Notes (Addendum)
STROKE TEAM PROGRESS NOTE    Interval History   No acute events overnight, patient resting in bed with no family at bedside.  When asked why he came to the hospital, he states that his daughter told him he has not been eating well at home as of late.   Able to limitedly participate in the neurological examination due to confusion    Pertinent Lab Work and Imaging    11/13/20 CT Head WO IV Contrast Stable left-sided frontal parietal infarct similar to that noted on the prior exam. Chronic atrophic changes without acute abnormality.   Physical Examination   Constitutional: Calm, appropriate for condition  Cardiovascular: Normal RR Respiratory: No increased WOB   Mental status: Oriented to self only, follows commands with coaching  Speech: Fluent, nonsensical  Cranial nerves: EOMI, VFF, Face appears symmetric, Tongue midline  Motor: Normal bulk and tone. No drift. He is unable to fully participate in strength testing due to confusion. He is antigravity throughout.  Sensory: Intact to light touch  Coordination: UTA  Gait: Deferred   Assessment and Plan   [name] is a 80 y.o. male w/pmh of PMH significant for macular degeneration, cardiomyopathy, hyperlipidemia, hypertension, MI, paroxysmal atrial fibrillation on Xarelto, prior left MCA stroke and left Vertex SDH in March 2021 with residual right-sided weakness and wheelchair dependence at baseline, who presents with gradual functional decline. He was outside of the time window for IVTPA and mechanical thrombectomy.   #Prior Left Parietal Stroke  Patient presented with the symptoms described above and was noted to have aphasia and right sided weakness by the Neurology consult team on exam. Today he states that his right arm is broken however it is antigravity and he is able to participate limitedly in strength testing. Speech is fluent with and nonsensical. He is familiar to the stroke service,having been seen in March of  2021. Work up at the time was done for left MCA stroke with traumatic left vertex SDH. His CT Head this admission is pertinent for a chronic left frontal parietal stroke with no acute findings. We are unable to obtain an MRI Brain at this time due to his AICD. Given chronic findings on CT Head suspect possible recrudescence of stroke symptoms. Recommend metabolic/infectious work up. No need for full stroke work up at this time.  - Agree with Xarelto and Atorvastatin for stroke prevention  - Metabolic/infectious work up per primary team  - He follows with neurology outpatient and has an appointment scheduled for 11/27/20  Hospital day # 0  Mark Hinds, NP  [facility] Nurse Practitioner Patient seen and discussed with attending physician Dr. Carilyn Goodpasture MD NOTE : I have personally obtained history,examined this patient, reviewed notes, independently viewed imaging studies, participated in medical decision making and plan of care.ROS completed by me personally and pertinent positives fully documented  I have made any additions or clarifications directly to the above note. Agree with note above.  Patient presented with generalized declining function last few weeks with some transient speech difficulty and CT scan of the head x2 shows no acute infarct.  He did have prior left frontal subarachnoid and subdural hemorrhage and left parietal embolic infarct in March 2022 with some residual right-sided weakness.  He is on Xarelto for atrial fibrillation.  Neurological exam shows mostly old deficits without new acute abnormalities.  Recommend continuing Xarelto for stroke prevention.  Check lab work for infectious and metabolic etiologies.  No further stroke work-up is  necessary.  Greater than 50% time during this 35-minute visit was spent on counseling and coordination of care with embolic stroke and answering questions.  Discussed with Dr. Loleta Clayton.  Stroke team will sign off.  Kindly call for  questions.  Mark Contras, MD Medical Director [place] Pager: [phone] 11/14/2020 4:02 PM   To contact Stroke Continuity provider, please refer to [email or web address] After hours, contact General Neurology

## 2020-11-14 NOTE — Evaluation (Signed)
Occupational Therapy Evaluation Patient Details Name: Mark Clayton MRN: [id number] DOB: Jun 20, 1940 Today's Date: 11/14/2020    History of Present Illness 80 y.o. male with medical history significant of prior stroke with R hemiparesis in 2021, AICD placement, CHF with EF 20-25%, HTN.  He was brought to [facility] by his daughter for a primary complaint of Increased lethargy.   Clinical Impression   Patient admitted from home for the above diagnosis.  Prior level of function will need to be confirmed by family.  But, per the patient, he lives with his granddaughter, who assists with ADL, transfers, meds, home management, meals and community mobility.  He has a daughter that assists as well.  He uses a wheelchair for mobility.  Deficits are listed below.  Currently he is needing up to Mod A of 2 for basic transfers, and up to Max A seated for ADL completion.  Acute OT will follow him in the acute setting to maximize his functional status.  Home with 24 hour care from family is anticipated, but SNF may be needed if his functional deficits are too much for family to care for.      Follow Up Recommendations  Home health OT    Equipment Recommendations  None recommended by OT    Recommendations for Other Services       Precautions / Restrictions Precautions Precautions: Fall Restrictions Weight Bearing Restrictions: No      Mobility Bed Mobility Overal bed mobility: Needs Assistance Bed Mobility: Supine to Sit;Sit to Supine     Supine to sit: Mod assist Sit to supine: Max assist;+2 for physical assistance   General bed mobility comments: patient with increased confusion to lie down, needing increased assist for trunk and to bring his legs up. Patient Response: Anxious  Transfers Overall transfer level: Needs assistance   Transfers: Sit to/from Stand Sit to Stand: Mod assist;+2 physical assistance              Balance Overall balance assessment: Needs  assistance Sitting-balance support: Single extremity supported;Feet supported Sitting balance-Leahy Scale: Fair Sitting balance - Comments: patient using L arm to hold onto the foot plate to stabilize hime - visual deficts may be complicating this.   Standing balance support: Bilateral upper extremity supported Standing balance-Leahy Scale: Poor Standing balance comment: needing RW and external support.                           ADL either performed or assessed with clinical judgement   ADL Overall ADL's : Needs assistance/impaired     Grooming: Wash/dry face;Set up;Bed level           Upper Body Dressing : Moderate assistance;Sitting   Lower Body Dressing: Maximal assistance;Sitting/lateral leans               Functional mobility during ADLs: Moderate assistance;+2 for physical assistance;Rolling walker General ADL Comments: assist to power up.  Declined stand pivot to recliner.     Vision Baseline Vision/History: Legally blind Patient Visual Report: No change from baseline       Perception     Praxis      Pertinent Vitals/Pain Pain Assessment: Faces Faces Pain Scale: No hurt Pain Intervention(s): Monitored during session     Hand Dominance Left (due to CVA)   Extremity/Trunk Assessment Upper Extremity Assessment Upper Extremity Assessment: RUE deficits/detail;Generalized weakness RUE Deficits / Details: chronic R UE dysfunction due to prior CVA.  He is stronge distally,  and 1+/5 for MMT to R shoulder.  Grasp is 3+/5. RUE Sensation: WNL RUE Coordination: decreased gross motor   Lower Extremity Assessment Lower Extremity Assessment: Defer to PT evaluation   Cervical / Trunk Assessment Cervical / Trunk Assessment: Kyphotic   Communication Communication Communication: Expressive difficulties   Cognition Arousal/Alertness: Awake/alert Behavior During Therapy: Anxious Overall Cognitive Status: History of cognitive impairments - at  baseline                                 General Comments: Did fairly well with command following - verbal and tactile cues needed.  Oriented to person, place "hospital", year with chioces, but not month.  Difficult to assess orienation to currenlt situation.   General Comments   VSS on RA.    Exercises     Shoulder Instructions      Home Living Family/patient expects to be discharged to:: Private residence Living Arrangements: Other relatives ([place] daughter) Available Help at Discharge: Family;Available 24 hours/day Type of Home: House Home Access: Ramped entrance     Home Layout: One level     Bathroom Shower/Tub: Teacher, early years/pre: Handicapped height Bathroom Accessibility: Yes How Accessible: Accessible via walker Home Equipment: Walker - 2 wheels;Grab bars - tub/shower;Shower seat;Wheelchair - [employer]      Lives With: Family    Prior Functioning/Environment Level of Independence: Needs assistance  Gait / Transfers Assistance Needed: Patient uses w/c as primary mode of mobility.  Chart suggests assist with transfers. ADL's / Homemaking Assistance Needed: Per patient: he is able to participate with ADL completion at a sit/stand level, but has assist from family. Communication / Swallowing Assistance Needed: dysarthria and chronic aphasia.  consistent with yes/no questions.  Able to make needs known. Comments: Per chart: 80% blind, family assists with medication management, meals, home management and community mobility.        OT Problem List: Decreased strength;Decreased range of motion;Decreased activity tolerance;Impaired balance (sitting and/or standing);Impaired vision/perception;Decreased knowledge of use of DME or AE;Decreased safety awareness;Decreased cognition;Impaired UE functional use      OT Treatment/Interventions: Self-care/ADL training;Therapeutic exercise;DME and/or AE instruction;Balance  training;Patient/family education;Therapeutic activities    OT Goals(Current goals can be found in the care plan section) Acute Rehab OT Goals Patient Stated Goal: Hoping to go back home OT Goal Formulation: With patient Time For Goal Achievement: 11/28/20 Potential to Achieve Goals: Fair ADL Goals Pt Will Perform Grooming: with set-up;sitting Pt Will Perform Upper Body Bathing: with set-up;sitting Pt Will Perform Upper Body Dressing: with set-up;sitting Pt Will Transfer to Toilet: with min assist;stand pivot transfer;bedside commode  OT Frequency: Min 2X/week   Barriers to D/C:    none noted       Co-evaluation PT/OT/SLP Co-Evaluation/Treatment: Yes Reason for Co-Treatment: Complexity of the patient's impairments (multi-system involvement);For patient/therapist safety   OT goals addressed during session: ADL's and self-care      AM-PAC OT "6 Clicks" Daily Activity     Outcome Measure Help from another person eating meals?: A Little Help from another person taking care of personal grooming?: A Little Help from another person toileting, which includes using toliet, bedpan, or urinal?: A Lot Help from another person bathing (including washing, rinsing, drying)?: A Lot Help from another person to put on and taking off regular upper body clothing?: A Lot Help from another person to put on and taking off regular lower body clothing?: A Lot 6  Click Score: 14   End of Session Equipment Utilized During Treatment: Gait belt;Rolling walker Nurse Communication: Mobility status  Activity Tolerance: Patient limited by lethargy Patient left: in bed;with call bell/phone within reach;with bed alarm set  OT Visit Diagnosis: Unsteadiness on feet (R26.81);Other abnormalities of gait and mobility (R26.89);Muscle weakness (generalized) (M62.81);Other symptoms and signs involving cognitive function;Hemiplegia and hemiparesis Hemiplegia - Right/Left: Right Hemiplegia - dominant/non-dominant:  Dominant                Time: [id number] OT Time Calculation (min): 27 min Charges:  OT General Charges $OT Visit: 1 Visit OT Evaluation $OT Eval Moderate Complexity: 1 Mod  11/14/2020  Rich, OTR/L  Acute Rehabilitation Services  Office:  [phone]   Metta Clines 11/14/2020, 12:27 PM

## 2020-11-14 NOTE — Evaluation (Signed)
Physical Therapy Evaluation Patient Details Name: Mark Clayton MRN: [id number] DOB: [birth year] Today's Date: 11/14/2020   History of Present Illness  80 y/o male presented to ED on 8/17 with complaints of generalized weakness since 8/16. Suggesting UTI and possible pneumonia/CHF. PMH: CVA with R hemi in 2021, AICD placement, CHF with EF 20-25%, HTN  Clinical Impression  Unsure of accuracy of PLOF and home set up due to aphasia and impaired cognition. Per patient, he lives with grand daughter who assists with ADLs and transfers. He primarily uses w/c for mobility and requires assistance to transfer. Currently, patient requires up to maxA+2 for sit to stand transfer with RW. Refused transfer to recliner this date. Patient presents with generalized weakness, impaired balance, impaired cognition, impaired sensation, and impaired functional mobility. Patient will benefit from skilled PT services during acute stay to address listed deficits. Anticipate discharge home with HHPT and 24 hour assistance by family to maximize functional mobility and safety.     Follow Up Recommendations Home health PT;Supervision/Assistance - 24 hour    Equipment Recommendations  None recommended by PT    Recommendations for Other Services       Precautions / Restrictions Precautions Precautions: Fall Restrictions Weight Bearing Restrictions: No      Mobility  Bed Mobility Overal bed mobility: Needs Assistance Bed Mobility: Supine to Sit;Sit to Supine     Supine to sit: Mod assist Sit to supine: Max assist;+2 for physical assistance   General bed mobility comments: patient with increased confusion to lie down, needing increased assist for trunk and to bring his legs up.    Transfers Overall transfer level: Needs assistance Equipment used: Rolling Mikey Maffett (2 wheeled) Transfers: Sit to/from Stand Sit to Stand: Max assist;+2 physical assistance;+2 safety/equipment         General transfer comment:  maxA+2 to rise and steady. patient resistant to movement. Refused transer to recliner  Ambulation/Gait                Stairs            Wheelchair Mobility    Modified Rankin (Stroke Patients Only) Modified Rankin (Stroke Patients Only) Pre-Morbid Rankin Score: Severe disability Modified Rankin: Severe disability     Balance Overall balance assessment: Needs assistance Sitting-balance support: Single extremity supported;Feet supported Sitting balance-Leahy Scale: Fair Sitting balance - Comments: patient using L arm to hold onto the foot plate to stabilize hime - visual deficts may be complicating this.   Standing balance support: Bilateral upper extremity supported Standing balance-Leahy Scale: Poor Standing balance comment: reliant on UE support and external assist                             Pertinent Vitals/Pain Pain Assessment: No/denies pain Faces Pain Scale: No hurt Pain Intervention(s): Monitored during session    Home Living Family/patient expects to be discharged to:: Private residence Living Arrangements: Other relatives (granddaughter) Available Help at Discharge: Family;Available 24 hours/day Type of Home: House Home Access: Ramped entrance     Home Layout: One level Home Equipment: Lahari Suttles - 2 wheels;Grab bars - tub/shower;Shower seat;Wheelchair - manual;Bedside commode      Prior Function Level of Independence: Needs assistance   Gait / Transfers Assistance Needed: Patient uses w/c as primary mode of mobility.  Chart suggests assist with transfers.  ADL's / Homemaking Assistance Needed: Per patient: he is able to participate with ADL completion at a sit/stand level, but has assist  from family.  Comments: Per chart: 80% blind, family assists with medication management, meals, home management and community mobility.     Hand Dominance   Dominant Hand: Left (due to CVA)    Extremity/Trunk Assessment   Upper Extremity  Assessment Upper Extremity Assessment: Defer to OT evaluation RUE Deficits / Details: chronic R UE dysfunction due to prior CVA.  He is stronge distally, and 1+/5 for MMT to R shoulder.  Grasp is 3+/5. RUE Sensation: WNL RUE Coordination: decreased gross motor    Lower Extremity Assessment Lower Extremity Assessment: Generalized weakness (R weaker than L)    Cervical / Trunk Assessment Cervical / Trunk Assessment: Kyphotic  Communication   Communication: Expressive difficulties  Cognition Arousal/Alertness: Awake/alert Behavior During Therapy: Anxious Overall Cognitive Status: History of cognitive impairments - at baseline                                 General Comments: Did fairly well with command following - verbal and tactile cues needed.  Oriented to person, place "hospital", year with chioces, but not month.  Difficult to assess orienation to currenlt situation.      General Comments      Exercises     Assessment/Plan    PT Assessment Patient needs continued PT services  PT Problem List Decreased strength;Decreased activity tolerance;Decreased balance;Decreased mobility;Decreased coordination;Decreased cognition;Decreased safety awareness;Impaired sensation       PT Treatment Interventions DME instruction;Gait training;Functional mobility training;Therapeutic activities;Therapeutic exercise;Balance training;Patient/family education    PT Goals (Current goals can be found in the Care Plan section)  Acute Rehab PT Goals Patient Stated Goal: hope to go back home PT Goal Formulation: With patient Time For Goal Achievement: 11/28/20 Potential to Achieve Goals: Fair    Frequency Min 2X/week   Barriers to discharge        Co-evaluation PT/OT/SLP Co-Evaluation/Treatment: Yes Reason for Co-Treatment: For patient/therapist safety;Complexity of the patient's impairments (multi-system involvement);To address functional/ADL transfers PT goals addressed  during session: Mobility/safety with mobility OT goals addressed during session: ADL's and self-care       AM-PAC PT "6 Clicks" Mobility  Outcome Measure Help needed turning from your back to your side while in a flat bed without using bedrails?: A Lot Help needed moving from lying on your back to sitting on the side of a flat bed without using bedrails?: A Lot Help needed moving to and from a bed to a chair (including a wheelchair)?: Total Help needed standing up from a chair using your arms (e.g., wheelchair or bedside chair)?: Total Help needed to walk in hospital room?: Total Help needed climbing 3-5 steps with a railing? : Total 6 Click Score: 8    End of Session Equipment Utilized During Treatment: Gait belt Activity Tolerance: Patient tolerated treatment well Patient left: in bed;with call bell/phone within reach;with bed alarm set Nurse Communication: Mobility status PT Visit Diagnosis: Unsteadiness on feet (R26.81);Muscle weakness (generalized) (M62.81);Difficulty in walking, not elsewhere classified (R26.2);Other symptoms and signs involving the nervous system (R29.898)    Time: [id number] PT Time Calculation (min) (ACUTE ONLY): 30 min   Charges:   PT Evaluation $PT Eval Moderate Complexity: 1 Mod PT Treatments $Therapeutic Activity: 8-22 mins        Randon Somera A. Gilford Rile PT, DPT Acute Rehabilitation Services Pager [phone] Office [phone]   Linna Hoff 11/14/2020, 1:58 PM

## 2020-11-14 NOTE — Progress Notes (Signed)
Patient arrived in the unit at 2345 pm from [facility], A&Ox2, initiated stroke protocol, pt failed [facility] swallow screen and order speech eval in the morning, and will continue to monitor closely.

## 2020-11-14 NOTE — Evaluation (Signed)
Clinical/Bedside Swallow Evaluation Patient Details  Name: Mark Clayton MRN: [id number] Date of Birth: [birth year]  Today's Date: 11/14/2020 Time: SLP Start Time (ACUTE ONLY): [id number] SLP Stop Time (ACUTE ONLY): 0940 SLP Time Calculation (min) (ACUTE ONLY): 15 min  Past Medical History:  Past Medical History:  Diagnosis Date   AICD (automatic cardioverter/defibrillator) present 01/17/2003   Medtronic Maximo 7232CX ICD, serial [id number] S   Anemia 02-06-11   takes oral iron   Arthritis    hands, knees   CAD (coronary artery disease) 2003   a. h/o MI and CABG in 2003. b. s/p DES to SVG-RPDA-RPLB in 08/2014.   Cancer of sigmoid colon ([place]) 2012   a. s/p colon surgery.   Carotid bruit    Chronic systolic CHF (congestive heart failure) ([place])    a. EF 20% in 2014; b. 08/2017 Echo: EF 20-25%, diff HK, Gr3 DD. Triv AI. Mod MR. Sev dil LA. Mildly dil RV w/ mildly reduced RV fxn. Mildly dil RA. Mod TR. PASP 57mg.   Cough    Exudative age-related macular degeneration of left eye with active choroidal neovascularization ([facility] 07/12/2019   Exudative age-related macular degeneration, right eye, with inactive choroidal neovascularization ([facility] 07/12/2019   GERD (gastroesophageal reflux disease) 02-06-11   HTN (hypertension)    Hyperlipidemia    Ischemic cardiomyopathy    a. EF 20% in 2014. (Master study EF >20%); b. 08/2017 Echo: EF 20-25%, diff HK. Gr3 DD.   LV (left ventricular) mural thrombus    a. 12/2012 Echo: EF 20% with mural thrombus No evidence of thrombus on 08/2017 echo.   Macular degeneration    Myocardial infarct ([facility]    2003   PAF (paroxysmal atrial fibrillation) (HCC)    a. CHA2DS2VASc = 5-->Xarelto/Tikosyn.   Ventricular tachycardia ([facility] [place] 11/05/2020   Past Surgical History:  Past Surgical History:  Procedure Laterality Date   CARDIAC CATHETERIZATION N/A 09/20/2014   Procedure: Left Heart Cath and Coronary Angiography;  Surgeon: JJettie Booze MD; LAD 95%, D1 100%, CFX liner  percent, OM 200%, OM 390%, RCA 90%, LIMA-LAD okay, SVG-OM 2-OM 3 minimal disease, SVG-RPDA-RPLB 100% between the RPDA and RPL      CARDIAC CATHETERIZATION N/A 09/20/2014   Procedure: Coronary Stent Intervention;  Surgeon: JJettie Booze MD; Synergy DES 4 x 24 mm reducing the stenosis to 5%    CARDIAC DEFIBRILLATOR PLACEMENT  01/17/03   6949 lead. Medtronic. remote-no; with later revision   CARDIOVERSION N/A 05/22/2016   Procedure: CARDIOVERSION;  Surgeon: BLelon Perla MD;  [facility];  Service: Cardiovascular;  Laterality: N/A;   CARDIOVERSION N/A 08/10/2017   Procedure: CARDIOVERSION;  Surgeon: HPixie Casino MD;  [facility];  Service: Cardiovascular;  Laterality: N/A;   CATARACT EXTRACTION W/ INTRAOCULAR LENS  IMPLANT, BILATERAL Bilateral June/-July 2009   Dr. GKaty Fitch  CATARACT EXTRACTION W/PHACO Bilateral 2010   Dr. GKaty Fitch  COLON RESECTION  02/09/2011   Procedure: LAPAROSCOPIC SIGMOID COLON RESECTION;  Surgeon: MPedro Earls MD;  [facility];  Service: General;  Laterality: N/A;  Laparoscopic Assisted Sigmoid Colectomy   COLON SURGERY     COLONOSCOPY  08/31/2011   Procedure: COLONOSCOPY;  Surgeon: JJerene Bears MD;  [facility];  Service: Gastroenterology;  Laterality: N/A;   COLONOSCOPY N/A 09/05/2012   Procedure: COLONOSCOPY;  Surgeon: JJerene Bears MD;  [facility];  Service: Gastroenterology;  Laterality: N/A;   COLONOSCOPY N/A 04/18/2013   Procedure: COLONOSCOPY;  Surgeon: JJerene Bears  MD;  [facility];  Service: Gastroenterology;  Laterality: N/A;   COLONOSCOPY N/A 04/09/2014   Procedure: COLONOSCOPY;  Surgeon: Jerene Bears, MD;  [facility];  Service: Gastroenterology;  Laterality: N/A;   COLONOSCOPY WITH PROPOFOL N/A 05/03/2017   Procedure: COLONOSCOPY WITH PROPOFOL;  Surgeon: Jerene Bears, MD;  [facility]: [facility];  Service: Gastroenterology;  Laterality: N/A;   CORONARY ANGIOPLASTY     CORONARY  ARTERY BYPASS GRAFT  01/2002   LIMA-LAD, SVG-OM 2-OM 3, SVG-RPDA-RPLB   CORONARY STENT INTERVENTION N/A 11/22/2018   Procedure: CORONARY STENT INTERVENTION;  Surgeon: Nelva Bush, MD;  Location: [place] CV LAB;  Service: Cardiovascular;  Laterality: N/A;  SVG - RCA   ICD GENERATOR CHANGE  2010   Medtronic Virtuoso II VR ICD   ICD GENERATOR CHANGEOUT N/A 12/07/2018   Procedure: ICD GENERATOR CHANGEOUT;  Surgeon: Deboraha Sprang, MD;  Location: [place] CV LAB;  Service: Cardiovascular;  Laterality: N/A;   INGUINAL HERNIA REPAIR Right 2000's X 2   LAPAROSCOPIC RIGHT HEMI COLECTOMY N/A 11/04/2012   Procedure: LAPAROSCOPIC RIGHT HEMI COLECTOMY;  Surgeon: Pedro Earls, MD;  Location: [facility];  Service: General;  Laterality: N/A;   LEFT HEART CATH AND CORS/GRAFTS ANGIOGRAPHY Left 11/22/2018   Procedure: LEFT HEART CATH AND CORS/GRAFTS ANGIOGRAPHY;  Surgeon: Nelva Bush, MD;  Location: [place] CV LAB;  Service: Cardiovascular;  Laterality: Left;   TONSILLECTOMY  ~ 1950   HPI:  80 y.o. 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 Neuro is following; their suspicion for him having a new stroke is low, however planning to repeat CT.  Potentially his symptoms are recrudescence in the setting of a UTI.  MRI not possible.   Assessment / Plan / Recommendation Clinical Impression  Pt presents with functional swallowing.  He completed his own oral care.  Normal oral mechanism exam with no focal CN deficits.  Demonstrated adequate mastication, the appearance of a brisk swallow response, and  no s/s of aspiration with thin liquids. Pt demonstrated some apraxic behaviors during self-feeding; he will need assist with tray set-up and intermittent supervision. Recommend resuming a regular diet, thin liquids. SLP Visit Diagnosis: Dysphagia, unspecified (R13.10)    Aspiration Risk  No limitations    Diet Recommendation   Regular solids, thin liquids  Medication Administration: Whole meds with liquid    Other  Recommendations Oral Care Recommendations: Oral care BID   Follow up Recommendations None        Swallow Study   General Date of Onset: 11/13/20 HPI: 80 y.o. 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 Neuro is following; their suspicion for him having a new stroke is low, however planning to repeat CT.  Potentially his symptoms are recrudescence in the setting of a UTI.  MRI not possible. Type of Study: Bedside Swallow Evaluation Previous Swallow Assessment:  see HPI Diet Prior to this Study: NPO Temperature Spikes Noted: No Respiratory Status: Room air History of Recent Intubation: No Behavior/Cognition: Alert;Cooperative;Pleasant mood Oral Cavity Assessment: Within Functional Limits Oral Care Completed by SLP: Other (Comment) (completed by pt) Oral Cavity - Dentition: Missing dentition Self-Feeding Abilities: Needs assist Patient Positioning: Upright in bed Baseline Vocal Quality: Normal Volitional Cough: Strong    Oral/Motor/Sensory Function Overall Oral Motor/Sensory Function: Within functional limits   Ice Chips Ice chips: Within functional limits   Thin Liquid  Thin Liquid: Within functional limits    Nectar Thick Nectar Thick Liquid: Not tested   Honey Thick Honey Thick Liquid: Not tested   Puree Puree: Within functional limits   Solid    Mark Clayton L. Tivis Ringer, MA CCC/SLP Acute Rehabilitation Services Office number [phone] Pager [phone]  Solid: Within functional limits      Mark Clayton 11/14/2020,9:57 AM

## 2020-11-14 NOTE — Plan of Care (Signed)

## 2020-11-14 NOTE — Evaluation (Signed)
Speech Language Pathology Evaluation Patient Details Name: Mark Clayton MRN: [id number] DOB: [birth year] Today's Date: 11/14/2020 Time: 0940-1007 SLP Time Calculation (min) (ACUTE ONLY): 27 min  Problem List:  Patient Active Problem List   Diagnosis Date Noted   Stroke-like symptoms 11/13/2020   Pleural effusion due to CHF (congestive heart failure) ([place]) 11/13/2020   Acute on chronic systolic heart failure ([place]) 11/05/2020   Chronic anticoagulation 11/05/2020   Ventricular tachycardia ([place]) 11/05/2020   Chronic cough 10/24/2020   Bilateral inguinal hernia 10/11/2020   Focal neurological deficit, onset greater than 24 hours 09/27/2020   Hallucination 09/27/2020   Pre-diabetes 09/27/2020   Anxiety 08/14/2020   Hemiplegia of right dominant side as late effect of cerebral infarction ([place]) 07/19/2020   Low urine output 07/05/2020   Hypotension 07/05/2020   Bradycardia 04/14/2020   Exudative age-related macular degeneration of left eye with inactive choroidal neovascularization ([place]) 02/13/2020   Exudative age-related macular degeneration of right eye with active choroidal neovascularization ([place]) 07/12/2019   Intermediate stage nonexudative age-related macular degeneration of left eye 07/12/2019   Hx of completed stroke 06/14/2019   Dysarthria    Tinnitus 08/26/2018   Essential hypertension 01/21/2018   Coronary artery disease involving native heart with angina pectoris ([place]) 10/19/2017   Persistent atrial fibrillation    Routine general medical examination at a health care facility 01/10/2016   Ischemic cardiomyopathy    Personal history of colon cancer    Implantable cardioverter-defibrillator (ICD) in situ 03/09/2012   Microcytic anemia 01/12/2011   Cardiac defibrillator  MDT VVI 12/26/2010   Hyperlipidemia 123456   Chronic systolic heart failure ([place]) 11/26/2008   Past Medical History:  Past Medical History:  Diagnosis Date   AICD (automatic  cardioverter/defibrillator) present 01/17/2003   Medtronic Maximo 7232CX ICD, serial [id number]   Anemia 02-06-11   takes oral iron   Arthritis    hands, knees   CAD (coronary artery disease) 2003   a. h/o MI and CABG in 2003. b. s/p DES to SVG-RPDA-RPLB in 08/2014.   Cancer of sigmoid colon ([place]) 2012   a. s/p colon surgery.   Carotid bruit    Chronic systolic CHF (congestive heart failure) ([place])    a. EF 20% in 2014; b. 08/2017 Echo: EF 20-25%, diff HK, Gr3 DD. Triv AI. Mod MR. Sev dil LA. Mildly dil RV w/ mildly reduced RV fxn. Mildly dil RA. Mod TR. PASP 15mg.   Cough    Exudative age-related macular degeneration of left eye with active choroidal neovascularization ([place] 07/12/2019   Exudative age-related macular degeneration, right eye, with inactive choroidal neovascularization ([place] 07/12/2019   GERD (gastroesophageal reflux disease) 02-06-11   HTN (hypertension)    Hyperlipidemia    Ischemic cardiomyopathy    a. EF 20% in 2014. (Master study EF >20%); b. 08/2017 Echo: EF 20-25%, diff HK. Gr3 DD.   LV (left ventricular) mural thrombus    a. 12/2012 Echo: EF 20% with mural thrombus No evidence of thrombus on 08/2017 echo.   Macular degeneration    Myocardial infarct ([facility]    2003   PAF (paroxysmal atrial fibrillation) ([facility])    a. CHA2DS2VASc = 5-->Xarelto/Tikosyn.   Ventricular tachycardia ([place] 11/05/2020   Past Surgical History:  Past Surgical History:  Procedure Laterality Date   CARDIAC CATHETERIZATION N/A 09/20/2014   Procedure: Left Heart Cath and Coronary Angiography;  Surgeon: JJettie Booze MD; LAD 95%, D1 100%, CFX liner percent, OM 200%, OM 390%, RCA 90%, LIMA-LAD okay, SVG-OM 2-OM 3  minimal disease, SVG-RPDA-RPLB 100% between the RPDA and RPL      CARDIAC CATHETERIZATION N/A 09/20/2014   Procedure: Coronary Stent Intervention;  Surgeon: Jettie Booze, MD; Synergy DES 4 x 24 mm reducing the stenosis to 5%    CARDIAC DEFIBRILLATOR PLACEMENT  01/17/03   6949  lead. Medtronic. remote-no; with later revision   CARDIOVERSION N/A 05/22/2016   Procedure: CARDIOVERSION;  Surgeon: Lelon Perla, MD;  Location[facility] END[facility]COPY;  Service: Cardiovascular;  Laterality: N/A;   CARDIOVERSION N/A 08/10/2017   Procedure: CARDIOVERSION;  Surgeon: Pixie Casino, MD;  Location: [facility] ENDOSCOPY;  Service: Cardiovascular;  Laterality: N/A;   CATARACT EXTRACTION W/ INTRAOCULAR LENS  IMPLANT, BILATERAL Bilateral June/-July 2009   Dr. Katy Fitch   CATARACT EXTRACTION W/PHACO Bilateral 2010   Dr. Katy Fitch   COLON RESECTION  02/09/2011   Procedure: LAPAROSCOPIC SIGMOID COLON RESECTION;  Surgeon: Pedro Earls, MD;  Location: [facility] ORS;  Service: General;  Laterality: N/A;  Laparoscopic Assisted Sigmoid Colectomy   COLON SURGERY     COLONOSCOPY  08/31/2011   Procedure: COLONOSCOPY;  Surgeon: Jerene Bears, MD;  Location: [facility] ENDOSCOPY;  Service: Gastroenterology;  Laterality: N/A;   COLONOSCOPY N/A 09/05/2012   Procedure: COLONOSCOPY;  Surgeon: Jerene Bears, MD;  Location: [facility] ENDOSCOPY;  Service: Gastroenterology;  Laterality: N/A;   COLONOSCOPY N/A 04/18/2013   Procedure: COLONOSCOPY;  Surgeon: Jerene Bears, MD;  Location: [facility] ENDOSCOPY;  Service: Gastroenterology;  Laterality: N/A;   COLONOSCOPY N/A 04/09/2014   Procedure: COLONOSCOPY;  Surgeon: Jerene Bears, MD;  Location: [facility] ENDOSCOPY;  Service: Gastroenterology;  Laterality: N/A;   COLONOSCOPY WITH PROPOFOL N/A 05/03/2017   Procedure: COLONOSCOPY WITH PROPOFOL;  Surgeon: Jerene Bears, MD;  Location: [facility] ENDOSCOPY;  Service: Gastroenterology;  Laterality: N/A;   CORONARY ANGIOPLASTY     CORONARY ARTERY BYPASS GRAFT  01/2002   LIMA-LAD, SVG-OM 2-OM 3, SVG-RPDA-RPLB   CORONARY STENT INTERVENTION N/A 11/22/2018   Procedure: CORONARY STENT INTERVENTION;  Surgeon: Nelva Bush, MD;  Location: [place] CV LAB;  Service: Cardiovascular;  Laterality: N/A;  SVG - RCA   ICD GENERATOR CHANGE  2010   Medtronic Virtuoso II VR ICD   ICD  GENERATOR CHANGEOUT N/A 12/07/2018   Procedure: ICD GENERATOR CHANGEOUT;  Surgeon: Deboraha Sprang, MD;  Location: [place] CV LAB;  Service: Cardiovascular;  Laterality: N/A;   INGUINAL HERNIA REPAIR Right 2000's X 2   LAPAROSCOPIC RIGHT HEMI COLECTOMY N/A 11/04/2012   Procedure: LAPAROSCOPIC RIGHT HEMI COLECTOMY;  Surgeon: Pedro Earls, MD;  Location: [facility] ORS;  Service: General;  Laterality: N/A;   LEFT HEART CATH AND CORS/GRAFTS ANGIOGRAPHY Left 11/22/2018   Procedure: LEFT HEART CATH AND CORS/GRAFTS ANGIOGRAPHY;  Surgeon: Nelva Bush, MD;  Location: [place] CV LAB;  Service: Cardiovascular;  Laterality: Left;   TONSILLECTOMY  ~ 1950   HPI:  80 y.o. 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 Neuro is following; their suspicion for him having a new stroke is low, however planning to repeat CT.  Potentially his symptoms are recrudescence in the setting of a UTI.  MRI not possible.   Assessment / Plan / Recommendation Clinical Impression  Pt presents with chronic aphasia, likely nearing baseline, marked by relatively good comprehension, with functional yes/no reliability and ability to follow one and two-step commands.  Has more difficulty with complex questions and commands.  Expression is marked by fluent output with phonemic and semantic paraphasias; good awareness of word-retrieval deficits with efforts to repair.  Repetition of single words and short phrases is intact.  Pt makes needs known; his social/pragmatic communication skills are excellent.  SLP will follow briefly while  admitted; await new CT results.    SLP Assessment  SLP Recommendation/Assessment: Patient needs continued Speech Lanaguage Pathology Services SLP Visit Diagnosis: Aphasia (R47.01)    Follow Up Recommendations  Other (comment) (tba)    Frequency and Duration min 2x/week  1 week      SLP Evaluation Cognition  Overall Cognitive Status: History of cognitive impairments - at baseline Arousal/Alertness: Awake/alert Orientation Level: Oriented to person       Comprehension  Auditory Comprehension Overall Auditory Comprehension: Impaired at baseline Yes/No Questions: Within Functional Limits Commands: Impaired Multistep Basic Commands: 25-49% accurate Conversation: Simple Visual Recognition/Discrimination Discrimination: Within Function Limits Reading Comprehension Reading Status: Impaired Sentence Level: Impaired    Expression Expression Primary Mode of Expression: Verbal Verbal Expression Overall Verbal Expression: Impaired at baseline Initiation: No impairment Level of Generative/Spontaneous Verbalization: Conversation Repetition: Impaired (at complex, sentence level) Level of Impairment: Sentence level Naming: Impairment Responsive: 76-100% accurate Confrontation: Within functional limits Divergent: 50-74% accurate Verbal Errors: Semantic paraphasias;Aware of errors;Phonemic paraphasias Pragmatics: No impairment Written Expression Dominant Hand: Right   Oral / Motor  Oral Motor/Sensory Function Overall Oral Motor/Sensory Function: Within functional limits Motor Speech Overall Motor Speech: Appears within functional limits for tasks assessed   GO           Mark Clayton L. Tivis Ringer, [place] Office number [phone] Pager [phone]         Mark Clayton Laurice 11/14/2020, 10:18 AM

## 2020-11-14 NOTE — Progress Notes (Signed)
[facility] PROGRESS NOTE    Mark Clayton  [id number] DOB: 08-20-1940 DOA: 11/13/2020 PCP: [name], MD      Brief Narrative:  Mark Clayton is a 80 y.o. M with sCHF EF <20%, AICD, stroke in 2021 with R hemiparesis, HTN, AF on Xarelto who presented with 3 days progressive weakness and coonfusion in setting of 2-3 weeks of CHF flare.  In the ER, UA showed maybe pyuria.  Legs swollen, effusion on CXR.  Admitted for rule out stroke and also UTI.         Assessment & Plan:  Acute metabolic encephalopathy At baseline the patient is somewhat dysarthric over the last 2 years, but clearly oriented and interactive and appropriate in all of his previous notes, and per family, also able to feed self, no concerns for memory loss.  Here, he remains clearly mentally slowed, having difficulty finding words, at times disoriented (stating [place] am in [place]  Per family, this started 2-3 days prior to admission, forgetting people's names, seeming confused, not being able to feed himself, irritable.  Spoke with the stroke team, they feel confident based on his exam and repeated CTHs that he has not had a stroke.  I will review reversible casues of confusion, but I suspect his encephalopathy is from end stage HF and anemia superimposed on his baseline poor cognitive function from stroke.    - Check ammonia, B12, TSH, RPR -Hold baclofen, not on other sedating meds    Acute on chronic systolic CHF Bilateral pleural effusions Early August was in florid HF flare at his Cardiologist's office, they recommended hospitalization and he refused.  Treated at home and some improvement.  Here though swelling still greater than baseline per daughter, JVP way up.  Pleural effusions on exam, notes some heavy breathing.  No orthopnea.   -Stop PO Lasix - Start IV Lasix 40 BID - Strict I/Os - Continue K - Daily  BMP  - Consult palliative care, given how he is worsneing clnically despite appropriate outpatient treatment of his CHF flare over the last 3 weeks, my feeeling is that his HF has progressed to end stage and our treatment here will not improve things   Normocytic anemia This is new.  Low normal MCV but RDW way up.   - Check iron stores - Tbili up, check haptoglobin, LDH - No melena or hematochezia noted, continue Xarelto for now - Will hold Plavix as he has no firm indication for the combination and if this is mucosal bleeding     Asymptomatic bacteriuria Unclear if this is infection, no symptoms. -Continue Abx for now, given overall poor prognosis      Persistent atrial fibrillation - Continue Xarelto for now - COntinue carvedilol  Cerebrovascular disease, secondary prevention Hypertension Hemiplegia BP soft - COntinue atorvastatin, Coreg, Xarelto -Hold Imdur - Hold Plavix  GERD -Continue PPI           Disposition: Status is: Inpatient  Remains inpatient appropriate because: remains confused, has CHF requiring IV lasix  Dispo: The patient is from: Home              Anticipated d/c is to:  TBD              Patient currently is not medically stable to d/c.   Difficult to place patient No       Level of care: Telemetry Medical  MDM: The below labs and imaging reports were reviewed and summarized above.  Medication management as above.    DVT prophylaxis:  Rivaroxaban (XARELTO) tablet 15 mg  Code Status: DNR Family Communication: daughter   by phone    Consultants:  Neuro  Procedures:     Antimicrobials:  Rocephin   Culture data:  Urine culture Blood culture           Subjective: Heaviness with breathin.  Feels confused.  No fever.  No dysuria.  No flank pain.  No vomiting.  No respiratory distress. No focal weakness that is new  Objective: Vitals:   11/14/20 0140 11/14/20 0340 11/14/20 0540 11/14/20 0745   BP: 123/77  116/72 116/80  Pulse:  91 86 84  Resp: '16 17 14 13  '$ Temp: 98 F (36.7 C) 97.8 F (36.6 C) 98.3 F (36.8 C) 98 F (36.7 C)  TempSrc: Oral Oral Oral Oral  SpO2:  100% 100% 100%  Weight:      Height:        Intake/Output Summary (Last 24 hours) at 11/14/2020 1440 Last data filed at 11/13/2020 2352 Gross per 24 hour  Intake --  Output 100 ml  Net -100 ml   Filed Weights   11/13/20 1943  Weight: 61.7 kg    Examination: General appearance: thin adult male, aawake, confused  HEENT: Anicteric, conjunctiva pink, lids and lashes normal. No nasal deformity, discharge, epistaxis.  Lips moist, OP dry, no oral lesions.   Skin: Warm and dry.  No jaundice.  No suspicious rashes or lesions. Cardiac: RRR, nl [id number], soft systolic murmur, 1+ LE edema to mid shin, JVP to jaw.  Radial  pulses 2+ and symmetric. Respiratory: Normal respiratory rate and rhythm.  CTAB without rales or wheezes. Abdomen: Abdomen soft.  Mild TTP in left side, no rebound, no rigidity, no guarding. No ascites, distension, hepatosplenomegaly.   MSK: No deformities or effusions. Moderate loss of subcutanous mustle mass and fat. Neuro: Awake but confused sleepy.  Seems somewhat aphasic, forgets a lot of words, rambles, about half of what he says makes sense and only about half of what you ask him does he understand.  EOMI, face symmetric, hemiparesis noted, old. Dysrthria old. Psych: Sensorium intact and responding to questions, attention diminished, judgment and insight abnormal      Data Reviewed: I have personally reviewed following labs and imaging studies:  CBC: Recent Labs  Lab 11/13/20 2008  WBC 4.0  NEUTROABS 2.7  HGB 8.5*  HCT 29.9*  MCV 91.7  PLT 123456   Basic Metabolic Panel: Recent Labs  Lab 11/13/20 2008  NA 136  K 3.9  CL 108  CO2 20*  GLUCOSE 104*  BUN 31*  CREATININE 1.17  CALCIUM 8.0*  MG 1.8   GFR: Estimated Creatinine Clearance: 44.7 mL/min (by C-G formula based on SCr of  1.17 mg/dL). Liver Function Tests: Recent Labs  Lab 11/13/20 2008  AST 26  ALT 14  ALKPHOS 62  BILITOT 2.1*  PROT 5.1*  ALBUMIN 2.8*   No results for input(s): LIPASE, AMYLASE in the last 168 hours. No results for input(s): AMMONIA in the last 168 hours. Coagulation Profile: No results for input(s): INR, PROTIME in the last 168 hours. Cardiac Enzymes: No results for input(s): CKTOTAL, CKMB, CKMBINDEX, TROPONINI in the last 168 hours. BNP (last 3 results) No results for input(s): PROBNP in the last 8760 hours. HbA1C: Recent Labs    11/14/20 0305  HGBA1C 6.7*   CBG: No  results for input(s): GLUCAP in the last 168 hours. Lipid Profile: Recent Labs    11/14/20 0305  CHOL 78  HDL 31*  LDLCALC 36  TRIG 57  CHOLHDL 2.5   Thyroid Function Tests: No results for input(s): TSH, T4TOTAL, FREET4, T3FREE, THYROIDAB in the last 72 hours. Anemia Panel: No results for input(s): VITAMINB12, FOLATE, FERRITIN, TIBC, IRON, RETICCTPCT in the last 72 hours. Urine analysis:    Component Value Date/Time   COLORURINE YELLOW 11/13/2020 2027   APPEARANCEUR CLEAR 11/13/2020 2027   LABSPEC 1.010 11/13/2020 2027   PHURINE 5.0 11/13/2020 2027   GLUCOSEU NEGATIVE 11/13/2020 2027   HGBUR SMALL (A) 11/13/2020 2027   BILIRUBINUR NEGATIVE 11/13/2020 2027   KETONESUR NEGATIVE 11/13/2020 2027   PROTEINUR NEGATIVE 11/13/2020 2027   UROBILINOGEN 1.0 12/11/2008 1150   NITRITE POSITIVE (A) 11/13/2020 2027   LEUKOCYTESUR SMALL (A) 11/13/2020 2027   Sepsis Labs: '@LABRCNTIP'$ (procalcitonin:4,lacticacidven:4)  ) Recent Results (from the past 240 hour(s))  Resp Panel by RT-PCR (Flu A&B, Covid) Nasopharyngeal Swab     Status: None   Collection Time: 11/13/20  8:30 PM   Specimen: Nasopharyngeal Swab; Nasopharyngeal(NP) swabs in vial transport medium  Result Value Ref Range Status   SARS Coronavirus 2 by RT PCR NEGATIVE NEGATIVE Final    Comment: (NOTE) SARS-CoV-2 target nucleic acids are NOT  DETECTED.  The SARS-CoV-2 RNA is generally detectable in upper respiratory specimens during the acute phase of infection. The lowest concentration of SARS-CoV-2 viral copies this assay can detect is 138 copies/mL. A negative result does not preclude SARS-Cov-2 infection and should not be used as the sole basis for treatment or other patient management decisions. A negative result may occur with  improper specimen collection/handling, submission of specimen other than nasopharyngeal swab, presence of viral mutation(s) within the areas targeted by this assay, and inadequate number of viral copies(<138 copies/mL). A negative result must be combined with clinical observations, patient history, and epidemiological information. The expected result is Negative.  Fact Sheet for Patients:  EntrepreneurPulse.com.au  Fact Sheet for Healthcare Providers:  IncredibleEmployment.be  This test is no t yet approved or cleared by the [place] FDA and  has been authorized for detection and/or diagnosis of SARS-CoV-2 by FDA under an Emergency Use Authorization (EUA). This EUA will remain  in effect (meaning this test can be used) for the duration of the COVID-19 declaration under Section 564(b)(1) of the Act, 21 U.S.C.section 360bbb-3(b)(1), unless the authorization is terminated  or revoked sooner.       Influenza A by PCR NEGATIVE NEGATIVE Final   Influenza B by PCR NEGATIVE NEGATIVE Final    Comment: (NOTE) The Xpert Xpress SARS-CoV-2/FLU/RSV plus assay is intended as an aid in the diagnosis of influenza from Nasopharyngeal swab specimens and should not be used as a sole basis for treatment. Nasal washings and aspirates are unacceptable for Xpert Xpress SARS-CoV-2/FLU/RSV testing.  Fact Sheet for Patients: EntrepreneurPulse.com.au  Fact Sheet for Healthcare Providers: IncredibleEmployment.be  This test is not yet  approved or cleared by the [place] FDA and has been authorized for detection and/or diagnosis of SARS-CoV-2 by FDA under an Emergency Use Authorization (EUA). This EUA will remain in effect (meaning this test can be used) for the duration of the COVID-19 declaration under Section 564(b)(1) of the Act, 21 U.S.C. section 360bbb-3(b)(1), unless the authorization is terminated or revoked.  Performed at [facility], [place] [address], [place], [place] [address]   Blood culture (routine x 2)  Status: None (Preliminary result)   Collection Time: 11/13/20 10:11 PM   Specimen: BLOOD LEFT FOREARM  Result Value Ref Range Status   Specimen Description BLOOD LEFT FOREARM  Final   Special Requests   Final    BOTTLES DRAWN AEROBIC AND ANAEROBIC Blood Culture results may not be optimal due to an inadequate volume of blood received in culture bottles   Culture   Final    NO GROWTH < 12 HOURS Performed at [facility], [address] [place], [place] [address]    Report Status PENDING  Incomplete  Blood culture (routine x 2)     Status: None (Preliminary result)   Collection Time: 11/13/20 11:29 PM   Specimen: BLOOD RIGHT ARM  Result Value Ref Range Status   Specimen Description   Final    BLOOD RIGHT ARM Performed at [facility], [address] [place], [place] [address]    Special Requests   Final    BOTTLES DRAWN AEROBIC AND ANAEROBIC Blood Culture adequate volume Performed at [facility], [address] [place], [place] [address]    Culture   Final    NO GROWTH < 12 HOURS Performed at [facility], [place] [address], [place], [place] [address]    Report Status PENDING  Incomplete         Radiology Studies: DG Chest 2 View  Result Date: 11/13/2020 CLINICAL DATA:  Weakness EXAM: CHEST - 2 VIEW COMPARISON:  09/26/2020 FINDINGS: Post sternotomy changes. Left-sided pacing device as before. Mild cardiomegaly with small moderate left  pleural effusion and mild airspace disease. Aortic atherosclerosis. No pneumothorax. IMPRESSION: Small moderate left pleural effusion with airspace disease at left base. Cardiomegaly Electronically Signed   By: Donavan Foil M.D.   On: 11/13/2020 20:26   CT HEAD WO CONTRAST (5MM)  Result Date: 11/13/2020 CLINICAL DATA:  Generalized weakness for 2 days EXAM: CT HEAD WITHOUT CONTRAST TECHNIQUE: Contiguous axial images were obtained from the base of the skull through the vertex without intravenous contrast. COMPARISON:  10/29/2020 FINDINGS: Brain: Mild chronic atrophic changes are noted. There are encephalomalacia changes identified in the left frontal parietal region consistent with prior infarct. Mild ex vacuo dilatation of the left lateral ventricle is seen. No acute hemorrhage, acute infarction or space-occupying mass lesion is noted. Prominent cisterna magna is noted posteriorly in the posterior fossa. Vascular: No hyperdense vessel or unexpected calcification. Skull: Normal. Negative for fracture or focal lesion. Sinuses/Orbits: No acute finding. Other: None. IMPRESSION: Stable left-sided frontal parietal infarct similar to that noted on the prior exam. Chronic atrophic changes without acute abnormality. Electronically Signed   By: Inez Catalina M.D.   On: 11/13/2020 21:09        Scheduled Meds:   stroke: mapping our early stages of recovery book   Does not apply Once   atorvastatin  80 mg Oral Daily   carvedilol  6.25 mg Oral BID WC   clopidogrel  75 mg Oral Daily   furosemide  40 mg Intravenous BID   pantoprazole  40 mg Oral Daily   potassium chloride  20 mEq Oral Daily   rivaroxaban  15 mg Oral Q supper   vitamin B-12  100 mcg Oral Daily   Continuous Infusions:  cefTRIAXone (ROCEPHIN)  IV       LOS: 0 days    Time spent: 35 minutes    Edwin Dada, MD [facility] 11/14/2020, 2:40 PM  Please page though [place] or Epic secure chat:  For [employer],  Adult nurse

## 2020-11-15 ENCOUNTER — Encounter: Payer: Medicare Other | Admitting: Student

## 2020-11-15 LAB — BASIC METABOLIC PANEL
Anion gap: 9 (ref 5–15)
BUN: 30 mg/dL — ABNORMAL HIGH (ref 8–23)
CO2: 24 mmol/L (ref 22–32)
Calcium: 9 mg/dL (ref 8.9–10.3)
Chloride: 105 mmol/L (ref 98–111)
Creatinine, Ser: 1.35 mg/dL — ABNORMAL HIGH (ref 0.61–1.24)
GFR, Estimated: 53 mL/min — ABNORMAL LOW (ref >60)
Glucose, Bld: 125 mg/dL — ABNORMAL HIGH (ref 70–99)
Potassium: 4 mmol/L (ref 3.5–5.1)
Sodium: 138 mmol/L (ref 135–145)

## 2020-11-15 LAB — CBC
HCT: 29.6 % — ABNORMAL LOW (ref 39.0–52.0)
Hemoglobin: 9 g/dL — ABNORMAL LOW (ref 13.0–17.0)
MCH: 26.6 pg (ref 26.0–34.0)
MCHC: 30.4 g/dL (ref 30.0–36.0)
MCV: 87.6 fL (ref 80.0–100.0)
Platelets: 208 10*3/uL (ref 150–400)
RBC: 3.38 MIL/uL — ABNORMAL LOW (ref 4.22–5.81)
RDW: 17.2 % — ABNORMAL HIGH (ref 11.5–15.5)
WBC: 5.2 10*3/uL (ref 4.0–10.5)
nRBC: 0 % (ref 0.0–0.2)

## 2020-11-15 LAB — RPR: RPR Ser Ql: NONREACTIVE

## 2020-11-15 LAB — T4, FREE: Free T4: 1.17 ng/dL — ABNORMAL HIGH (ref 0.61–1.12)

## 2020-11-15 LAB — HAPTOGLOBIN: Haptoglobin: 93 mg/dL (ref 34–355)

## 2020-11-15 MED ORDER — FERROUS SULFATE 325 (65 FE) MG PO TABS
325.0000 mg | ORAL_TABLET | Freq: Two times a day (BID) | ORAL | Status: DC
  Administered 2020-11-17 – 2020-11-21 (×9): 325 mg via ORAL
  Filled 2020-11-15 (×9): qty 1

## 2020-11-15 MED ORDER — SENNOSIDES-DOCUSATE SODIUM 8.6-50 MG PO TABS
1.0000 | ORAL_TABLET | Freq: Every day | ORAL | Status: DC
  Administered 2020-11-15 – 2020-11-20 (×6): 1 via ORAL
  Filled 2020-11-15 (×6): qty 1

## 2020-11-15 MED ORDER — DM-GUAIFENESIN ER 30-600 MG PO TB12: 1.0000 | ORAL_TABLET | Freq: Two times a day (BID) | ORAL | Status: DC | PRN

## 2020-11-15 MED ORDER — TRAZODONE HCL 50 MG PO TABS
50.0000 mg | ORAL_TABLET | Freq: Every evening | ORAL | Status: DC | PRN
  Administered 2020-11-15 – 2020-11-20 (×5): 50 mg via ORAL
  Filled 2020-11-15 (×5): qty 1

## 2020-11-15 MED ORDER — SODIUM CHLORIDE 0.9 % IV SOLN
250.0000 mg | Freq: Every day | INTRAVENOUS | Status: AC
  Administered 2020-11-15 – 2020-11-16 (×2): 250 mg via INTRAVENOUS
  Filled 2020-11-15 (×2): qty 20

## 2020-11-15 NOTE — Consult Note (Signed)
                                                                                 Consultation Note Date: 11/15/2020   Patient Name: Mark Clayton  DOB: 12/28/1940  MRN: [id number]  Age / Sex: 80 y.o., male  PCP: Crawford, Elizabeth A, MD Referring Physician: Amin, Ankit Chirag, MD  Reason for Consultation: Establishing goals of care  HPI/Patient Profile: 80 y.o. male  with past medical history of  prior stroke with R hemiparesis in 2021, AICD placement, CHF with EF 20-25%, HTN admitted on 11/13/2020 with generalized weakness symptoms, unable to assist with transfers.  Palliative care has been consulted to assist with goals of care conversation.  Clinical Assessment and Goals of Care:  I have reviewed medical records including EPIC notes, labs and imaging, assessed the patient and then met at the bedside to discuss diagnosis prognosis, GOC, EOL wishes, disposition and options. I then called patient's daughter Donielle and left a voicemail for his daughters Melissa and [name].  I introduced Palliative Medicine as specialized medical care for people living with serious illness. It focuses on providing relief from the symptoms and stress of a serious illness. The goal is to improve quality of life for both the patient and the family.  We discussed a brief life review of the patient and then focused on their current illness. The natural disease trajectory and expectations at EOL were discussed. [name] tells me that he has had a fulfilling life. 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  He currently lives with his granddaughter who is like a daughter to him and ambulates with a wheelchair.  He reports trouble walking due to his stroke.  He is motivated to continue with therapies, especially speech.  He enjoys working on cars and providing for his family.  I attempted to  elicit values and goals of care important to the patient.   Left states his goal in life is to "help people help themselves."  His family is most important to him.  During my call with Donielle, she tells me that she wants less to get the best care possible with a good quality of life for patient and family.  It has been more difficult to care for less over the past few months.  She notes a regression in his condition and understands the severity of his illness, which is likely due to end-stage heart failure.  The difference between aggressive medical intervention and comfort care was considered in light of the patient's goals of care.   Advanced directives, concepts specific to code status, artifical feeding and hydration, and rehospitalization were considered and discussed.  Hospice and Palliative Care services outpatient were explained and offered.  Discussed the importance of continued conversation with family and the medical providers regarding overall plan of care and treatment options, ensuring decisions are within the context of the patient's values and GOCs.    Questions and concerns were addressed. The family was encouraged to call with questions or concerns.    PMT will continue to support holistically.   NEXT OF KIN are patient's daughters. No HCPOA on file.    SUMMARY OF RECOMMENDATIONS   -DNR -Continue current interventions -Patient seems motivated to continue with HH therapies, family is more concerned and willing to meet/continue discussions on palliative vs hospice -Psychosocial and emotional support provided -Ongoing support from PMT  Code Status/Advance Care Planning: DNR  Palliative Prophylaxis:  Delirium Protocol  Additional Recommendations (Limitations, Scope, Preferences): Full Scope Treatment  Psycho-social/Spiritual:  Desire for further Chaplaincy support:tbd Additional Recommendations: Caregiving  Support/Resources and Education on Hospice  Prognosis:   Poor long-term prognosis given end stage HF, functional decline, multiple comorbidities  Discharge Planning: To Be Determined      Primary Diagnoses: Present on Admission:  Chronic systolic heart failure ([place])  Essential hypertension  Persistent atrial fibrillation  Hallucination  Dysarthria  Stroke-like symptoms  Cardiac defibrillator  MDT VVI  Acute metabolic encephalopathy   I have reviewed the medical record, interviewed the patient and family, and examined the patient. The following aspects are pertinent.  Past Medical History:  Diagnosis Date   AICD (automatic cardioverter/defibrillator) present 01/17/2003   Med[employer] Maximo 7232CX ICD, serial #[id number]   Anemia 02-06-11   takes oral iron   Arthritis    hands, knees   CAD (coronary artery disease) 2003   a. h/o MI and CABG in 2003. b. s/p DES to SVG-RPDA-RPLB in 08/2014.   Cancer of sigmoid colon ([place]) 2012   a. s/p colon surgery.   Carotid bruit    Chronic systolic CHF (congestive heart failure) ([place])    a. EF 20% in 2014; b. 08/2017 Echo: EF 20-25%, diff HK, Gr3 DD. Triv AI. Mod MR. Sev dil LA. Mildly dil RV w/ mildly reduced RV fxn. Mildly dil RA. Mod TR. PASP 48mg.   Cough    Exudative age-related macular degeneration of left eye with active choroidal neovascularization ([facility] 07/12/2019   Exudative age-related macular degeneration, right eye, with [facility] 07[facility]12[facility]2019   GERD (gastroesophageal reflux disease) 02-06-11   HTN (hypertension)    Hyperlipidemia    Ischemic cardiomyopathy    a. EF 20% in 2014. (Master study EF >20%); b. 08/2017 Echo: EF 20-25%, diff HK. Gr3 DD.   LV (left ventricular) mural thrombus    a. 12/2012 Echo: EF 20% with mural thrombus No evidence of thrombus on 08/2017 echo.   Macular degeneration    [place]ocardial infarct ([facility]    2003   PAF (paroxysmal atrial fibrillation) ([facility])    a. CHA2DS2VASc = 5-->Xarelto/Tikosyn.   Ventricular tachycardia ([facility]  11/05/2020   Social History   Socioeconomic History   Marital status: Widowed    Spouse name: Not on file   Number of children: 3   Years of education: Not on file   Highest education level: Not on file  Occupational History   Occupation: self employed/ BArt gallery manager Tobacco Use   Smoking status: Never   Smokeless tobacco: Never  Vaping Use   Vaping Use: Never used  Substance and Sexual Activity   Alcohol use: Yes    Alcohol/week: 2.0 standard drinks    Types: 2 Cans of beer per week   Drug use: No   Sexual activity: Not Currently  Other Topics Concern   Not on file  Social History Narrative   Full time. Married.    Right handed   Drinks caffeine   Two story home   Social Determinants of Health   Financial Resource Strain: Low  Risk    Difficulty of Paying Living Expenses: Not hard at all  Food Insecurity: No Food Insecurity   Worried About [place] in the Last Year: Never true   Ran Out of Food in the Last Year: Never true  Transportation Needs: No Transportation Needs   Lack of Transportation (Medical): No   Lack of Transportation (Non-Medical): No  Physical Activity: Insufficiently Active   Days of Exercise per Week: 2 days   Minutes of Exercise per Session: 60 min  Stress: No Stress Concern Present   Feeling of Stress : Not at all  Social Connections: Not on file   Family History  Problem Relation Age of Onset   Hypertension Father    Hyperlipidemia Father    Heart disease Father    Prostate cancer Father    Alzheimer's disease Mother    Hypertension Sister    Hyperlipidemia Sister    Colon cancer Neg Hx    Esophageal cancer Neg Hx    Rectal cancer Neg Hx    Stomach cancer Neg Hx    Scheduled Meds:   stroke: mapping our early stages of recovery book   Does not apply Once   atorvastatin  80 mg Oral Daily   carvedilol  6.25 mg Oral BID WC   [START ON 11/17/2020] ferrous sulfate  325 mg Oral BID WC   furosemide  40 mg Intravenous BID    pantoprazole  40 mg Oral Daily   potassium chloride  20 mEq Oral Daily   rivaroxaban  15 mg Oral Q supper   senna-docusate  1 tablet Oral QHS   vitamin B-12  100 mcg Oral Daily   Continuous Infusions:  cefTRIAXone (ROCEPHIN)  IV 1 g (11/14/20 2345)   ferric gluconate (FERRLECIT) IVPB 250 mg (11/15/20 1032)   PRN Meds:.acetaminophen **OR** acetaminophen (TYLENOL) oral liquid 160 mg/5 mL **OR** acetaminophen, dextromethorphan-guaiFENesin, lip balm, meclizine, polyethylene glycol, traZODone Medications Prior to Admission:  Prior to Admission medications   Medication Sig Start Date End Date Taking? Authorizing Provider  atorvastatin (LIPITOR) 80 MG tablet TAKE 1 TABLET BY MOUTH  DAILY Patient taking differently: Take 80 mg by mouth daily. 05/03/20  Yes Jerline Pain, MD  baclofen (LIORESAL) 10 MG tablet Take 1 tablet (10 mg total) by mouth 2 (two) times daily as needed for muscle spasms. 11/12/20  Yes [name], MD  carvedilol (COREG) 6.25 MG tablet TAKE 1 TABLET BY MOUTH  TWICE DAILY WITH A MEAL Patient taking differently: Take 6.25 mg by mouth 2 (two) times daily with a meal. 06/13/20  Yes [name], MD  clopidogrel (PLAVIX) 75 MG tablet TAKE 1 TABLET BY MOUTH  DAILY 07/17/20  Yes Deboraha Sprang, MD  furosemide (LASIX) 40 MG tablet Take 1.5 tablets (60 mg total) by mouth 2 (two) times daily. Patient taking differently: Take 40 mg by mouth daily. 11/05/20  Yes Jerline Pain, MD  isosorbide mononitrate (IMDUR) 30 MG 24 hr tablet Take 1 tablet (30 mg total) by mouth daily. 04/24/20  Yes Jerline Pain, MD  losartan (COZAAR) 50 MG tablet TAKE 1 TABLET BY MOUTH  DAILY Patient taking differently: Take 50 mg by mouth daily. 07/17/20  Yes [name], MD  meclizine (ANTIVERT) 25 MG tablet Take 1 tablet (25 mg total) by mouth daily as needed for dizziness. 06/25/20  Yes [name], MD  nitroGLYCERIN (NITROSTAT) 0.4 MG SL tablet DISSOLVE 1 TABLET UNDER THE TONGUE  EVERY 5 MINUTES  AS  NEEDED FOR CHEST PAIN. MAX  OF 3 TABLETS IN 15 MINUTES. CALL 911 IF PAIN PERSISTS. Patient taking differently: Place 0.4 mg under the tongue every 5 (five) minutes as needed for chest pain. 01/26/20  Yes Deboraha Sprang, MD  pantoprazole (PROTONIX) 40 MG tablet TAKE 1 TABLET BY MOUTH  DAILY Patient taking differently: Take 40 mg by mouth daily. 07/17/20  Yes [name], MD  polyethylene glycol (MIRALAX / GLYCOLAX) 17 g packet Take 17 g by mouth daily as needed for mild constipation.   Yes [provider]  potassium chloride (KLOR-CON) 10 MEQ tablet Take 20 mEq by mouth daily.   Yes [provider]  rivaroxaban (XARELTO) 20 MG TABS tablet Take 1 tablet (20 mg total) by mouth daily with supper. 04/25/20  Yes Jerline Pain, MD  vitamin B-12 (CYANOCOBALAMIN) 100 MCG tablet Take 100 mcg by mouth daily.    Yes [provider]   Allergies  Allergen Reactions   Buspar [Buspirone] Other (See Comments)    Fatigue, body aches and shortness of breath    Latex Rash   Tape Rash and Other (See Comments)    USE PAPER   Review of Systems  All other systems reviewed and are negative.  Physical Exam Vitals and nursing note reviewed.  Constitutional:      General: He is not in acute distress. Cardiovascular:     Rate and Rhythm: Normal rate.  Pulmonary:     Effort: Pulmonary effort is normal.  Neurological:     Mental Status: He is alert. He is confused.    Vital Signs: BP 102/70 (BP Location: Right Arm)   Pulse 68   Temp (!) 97.5 F (36.4 C) (Oral)   Resp 19   Ht 5' 9" (1.753 m)   Wt 61.7 kg   SpO2 98%   BMI 20.09 kg/m  Pain Scale: 0-10   Pain Score: 0-No pain   SpO2: SpO2: 98 % O2 Device:SpO2: 98 % O2 Flow Rate: .   IO: Intake/output summary:  Intake/Output Summary (Last 24 hours) at 11/15/2020 1515 Last data filed at 11/15/2020 1012 Gross per 24 hour  Intake 360 ml  Output 2350 ml  Net -1990 ml    LBM:   Baseline  Weight: Weight: 61.7 kg Most recent weight: Weight: 61.7 kg     Palliative Assessment/Data: 30-40%     Time In: 1:30p Time Out: 2:45p Time Total: 75 minutes Greater than 50% of this time was spent in counseling and coordinating care related to the above assessment and plan.  Dorthy Cooler, PA-C Palliative Medicine Team Team phone # [phone]  Thank you for allowing the Palliative Medicine Team to assist in the care of this patient. Please utilize secure chat with additional questions, if there is no response within 30 minutes please call the above phone number.  Palliative Medicine Team providers are available by phone from 7am to 7pm daily and can be reached through the team cell phone.  Should this patient require assistance outside of these hours, please call the patient's attending physician.

## 2020-11-15 NOTE — Progress Notes (Signed)
PROGRESS NOTE    Mark Clayton  [id number] DOB: 1940/07/24 DOA: 11/13/2020 PCP: [name], MD   Brief Narrative:   80 y.o. M with sCHF EF <20%, AICD, stroke in 2021 with R hemiparesis, HTN, AF on Xarelto who presented with 3 days progressive weakness and coonfusion in setting of 2-3 weeks of CHF flare. In the ER, UA showed maybe pyuria.  Legs swollen, effusion on CXR.  Admitted for rule out stroke and also UTI.  Neurology team was consulted, CT head was negative for any for it.  Suspected altered mental status due to metabolic issues therefore stroke team signed off with recommendations to continue Xarelto.   Assessment & Plan:   Principal Problem:   Stroke-like symptoms Active Problems:   Chronic systolic heart failure (HCC)   Cardiac defibrillator  MDT VVI   Persistent atrial fibrillation   Essential hypertension   Dysarthria   Hemiplegia of right dominant side as late effect of cerebral infarction (HCC)   Hallucination   Pleural effusion due to CHF (congestive heart failure) (HCC)   Acute metabolic encephalopathy   Acute metabolic encephalopathy UTI; GNR -Likely infectious in nature.  Stroke work-up thus far is negative, seen by neurology team. - Ammonia normal,TSH elevated, check T4, B12 normal -Empiric Rocephin    Acute CHF with reduced EF, EF < 20%, class III Bilateral pleural effusions CAD s/p CABG ICD in place.  - Lasix 40 mg IV twice daily; hopefully transition back to PO tomorrow. Outpatient lasix '60mg'$  po bid with Kcl 41mq  - Daily weight, monitor electrolytes. - Refused hospital admission previously.  Palliative team consulted   Normocytic anemia, iron deficiency Hx of Colon cancer s/p resection.  -Give IV iron x 2 days followed by p.o. with bowel regimen. Family doesn't want to investigate this further at this time.    Persistent atrial fibrillation -Continue Xarelto and Coreg   Cerebrovascular disease, secondary  prevention Hypertension Hemiplegia -Soft blood pressure.  Continue statin, Coreg, Xarelto   GERD -Continue PPI  PT/OT-recommending home health Speech and swallow-regular diet    DVT prophylaxis:  Rivaroxaban (XARELTO) tablet 15 mg  Code Status: DNR Family Communication:  daughter MLenna Sciaraupdated.   Status is: Inpatient  Remains inpatient appropriate because:Inpatient level of care appropriate due to severity of illness  Dispo: The patient is from: Home              Anticipated d/c is to: Home              Patient currently is not medically stable to d/c. Still has some exertional sob, therefore getting IV lasix. In the meantime getting IV iron to help replenish some stores.    Difficult to place patient No     Subjective: Feels better today. Still has some exertional sob.   Review of Systems Otherwise negative except as per HPI, including: General: Denies fever, chills, night sweats or unintended weight loss. Resp: Denies cough, wheezing, shortness of breath. Cardiac: Denies chest pain, palpitations, orthopnea, paroxysmal nocturnal dyspnea. GI: Denies abdominal pain, nausea, vomiting, diarrhea or constipation GU: Denies dysuria, frequency, hesitancy or incontinence MS: Denies muscle aches, joint pain or swelling Neuro: Denies headache, neurologic deficits (focal weakness, numbness, tingling), abnormal gait Psych: Denies anxiety, depression, SI/HI/AVH Skin: Denies new rashes or lesions ID: Denies sick contacts, exotic exposures, travel  Examination:  General exam: Appears calm and comfortable. Elderly frail  Respiratory system: mild bibasilar crackles.  Cardiovascular system: S1 & S2 heard, RRR. No JVD, murmurs,  rubs, gallops or clicks. No pedal edema. Gastrointestinal system: Abdomen is nondistended, soft and nontender. No organomegaly or masses felt. Normal bowel sounds heard. Central nervous system: Alert and oriented. No focal neurological deficits. Extremities:  Symmetric 5 x 5 power. Skin: No rashes, lesions or ulcers Psychiatry: Judgement and insight appear normal. Mood & affect appropriate.     Objective: Vitals:   11/14/20 2029 11/15/20 0101 11/15/20 0443 11/15/20 0737  BP: 113/69 112/88 105/75 109/71  Pulse: 77 78 80 83  Resp: '15 18 16 16  '$ Temp: (!) 97.5 F (36.4 C) 97.7 F (36.5 C) 98 F (36.7 C) 97.9 F (36.6 C)  TempSrc: Oral Oral Oral Oral  SpO2: 100% 98% 100% 98%  Weight:      Height:        Intake/Output Summary (Last 24 hours) at 11/15/2020 0854 Last data filed at 11/14/2020 2322 Gross per 24 hour  Intake 240 ml  Output 2350 ml  Net -2110 ml   Filed Weights   11/13/20 1943  Weight: 61.7 kg     Data Reviewed:   CBC: Recent Labs  Lab 11/13/20 2008 11/14/20 1520 11/15/20 0532  WBC 4.0 4.7 5.2  NEUTROABS 2.7  --   --   HGB 8.5* 8.8* 9.0*  HCT 29.9* 28.4* 29.6*  MCV 91.7 87.1 87.6  PLT 215 213 [id number]   Basic Metabolic Panel: Recent Labs  Lab 11/13/20 2008 11/14/20 1520 11/15/20 0532  NA 136 139 138  K 3.9 4.2 4.0  CL 108 107 105  CO2 20* 25 24  GLUCOSE 104* 122* 125*  BUN 31* 33* 30*  CREATININE 1.17 1.35* 1.35*  CALCIUM 8.0* 8.8* 9.0  MG 1.8  --   --    GFR: Estimated Creatinine Clearance: 38.7 mL/min (A) (by C-G formula based on SCr of 1.35 mg/dL (H)). Liver Function Tests: Recent Labs  Lab 11/13/20 2008  AST 26  ALT 14  ALKPHOS 62  BILITOT 2.1*  PROT 5.1*  ALBUMIN 2.8*   No results for input(s): LIPASE, AMYLASE in the last 168 hours. Recent Labs  Lab 11/14/20 1520  AMMONIA 19   Coagulation Profile: No results for input(s): INR, PROTIME in the last 168 hours. Cardiac Enzymes: No results for input(s): CKTOTAL, CKMB, CKMBINDEX, TROPONINI in the last 168 hours. BNP (last 3 results) No results for input(s): PROBNP in the last 8760 hours. HbA1C: Recent Labs    11/14/20 0305  HGBA1C 6.7*   CBG: No results for input(s): GLUCAP in the last 168 hours. Lipid Profile: Recent Labs     11/14/20 0305  CHOL 78  HDL 31*  LDLCALC 36  TRIG 57  CHOLHDL 2.5   Thyroid Function Tests: Recent Labs    11/14/20 1520  TSH 10.380*   Anemia Panel: Recent Labs    11/14/20 1520  VITAMINB12 1,455*  FERRITIN 22*  TIBC 424  IRON 17*   Sepsis Labs: No results for input(s): PROCALCITON, LATICACIDVEN in the last 168 hours.  Recent Results (from the past 240 hour(s))  Resp Panel by RT-PCR (Flu A&B, Covid) Nasopharyngeal Swab     Status: None   Collection Time: 11/13/20  8:30 PM   Specimen: Nasopharyngeal Swab; Nasopharyngeal(NP) swabs in vial transport medium  Result Value Ref Range Status   SARS Coronavirus 2 by RT PCR NEGATIVE NEGATIVE Final    Comment: (NOTE) SARS-CoV-2 target nucleic acids are NOT DETECTED.  The SARS-CoV-2 RNA is generally detectable in upper respiratory specimens during the acute phase of infection. The  lowest concentration of SARS-CoV-2 viral copies this assay can detect is 138 copies/mL. A negative result does not preclude SARS-Cov-2 infection and should not be used as the sole basis for treatment or other patient management decisions. A negative result may occur with  improper specimen collection/handling, submission of specimen other than nasopharyngeal swab, presence of viral mutation(s) within the areas targeted by this assay, and inadequate number of viral copies(<138 copies/mL). A negative result must be combined with clinical observations, patient history, and epidemiological information. The expected result is Negative.  Fact Sheet for Patients:  EntrepreneurPulse.com.au  Fact Sheet for Healthcare Providers:  IncredibleEmployment.be  This test is no t yet approved or cleared by the [place] FDA and  has been authorized for detection and/or diagnosis of SARS-CoV-2 by FDA under an Emergency Use Authorization (EUA). This EUA will remain  in effect (meaning this test can be used) for the  duration of the COVID-19 declaration under Section 564(b)(1) of the Act, 21 U.S.C.section 360bbb-3(b)(1), unless the authorization is terminated  or revoked sooner.       Influenza A by PCR NEGATIVE NEGATIVE Final   Influenza B by PCR NEGATIVE NEGATIVE Final    Comment: (NOTE) The Xpert Xpress SARS-CoV-2/FLU/RSV plus assay is intended as an aid in the diagnosis of influenza from Nasopharyngeal swab specimens and should not be used as a sole basis for treatment. Nasal washings and aspirates are unacceptable for Xpert Xpress SARS-CoV-2/FLU/RSV testing.  Fact Sheet for Patients: EntrepreneurPulse.com.au  Fact Sheet for Healthcare Providers: IncredibleEmployment.be  This test is not yet approved or cleared by the [place] FDA and has been authorized for detection and/or diagnosis of SARS-CoV-2 by FDA under an Emergency Use Authorization (EUA). This EUA will remain in effect (meaning this test can be used) for the duration of the COVID-19 declaration under Section 564(b)(1) of the Act, 21 U.S.C. section 360bbb-3(b)(1), unless the authorization is terminated or revoked.  Performed at [facility], [place] [address], [place], [place] [address]   Urine Culture     Status: Abnormal (Preliminary result)   Collection Time: 11/13/20  8:53 PM   Specimen: Urine, Clean Catch  Result Value Ref Range Status   Specimen Description URINE, CLEAN CATCH  Final   Special Requests NONE  Final   Culture (A)  Final    >=100,000 COLONIES/mL GRAM NEGATIVE RODS SUSCEPTIBILITIES TO FOLLOW Performed at [facility], [place] [address] [place], [place] [address]    Report Status PENDING  Incomplete  Blood culture (routine x 2)     Status: None (Preliminary result)   Collection Time: 11/13/20 10:11 PM   Specimen: BLOOD LEFT FOREARM  Result Value Ref Range Status   Specimen Description BLOOD LEFT FOREARM  Final   Special Requests   Final    BOTTLES DRAWN  AEROBIC AND ANAEROBIC Blood Culture results may not be optimal due to an inadequate volume of blood received in culture bottles   Culture   Final    NO GROWTH < 12 HOURS Performed at [facility], [place] [address] [place], [place] [address]    Report Status PENDING  Incomplete  Blood culture (routine x 2)     Status: None (Preliminary result)   Collection Time: 11/13/20 11:29 PM   Specimen: BLOOD RIGHT ARM  Result Value Ref Range Status   Specimen Description   Final    BLOOD RIGHT ARM Performed at [facility], [address] [place],  [address]  Special Requests   Final    BOTTLES DRAWN AEROBIC AND ANAEROBIC Blood Culture adequate volume Performed at [facility], [address] [place], [place] [address]    Culture   Final    NO GROWTH < 12 HOURS Performed at [place] [address], [place], [place] [address]    Report Status PENDING  Incomplete         Radiology Studies: DG Chest 2 View  Result Date: 11/13/2020 CLINICAL DATA:  Weakness EXAM: CHEST - 2 VIEW COMPARISON:  09/26/2020 FINDINGS: Post sternotomy changes. Left-sided pacing device as before. Mild cardiomegaly with small moderate left pleural effusion and mild airspace disease. Aortic atherosclerosis. No pneumothorax. IMPRESSION: Small moderate left pleural effusion with airspace disease at left base. Cardiomegaly Electronically Signed   By: Donavan Foil M.D.   On: 11/13/2020 20:26   CT HEAD WO CONTRAST (5MM)  Result Date: 11/13/2020 CLINICAL DATA:  Generalized weakness for 2 days EXAM: CT HEAD WITHOUT CONTRAST TECHNIQUE: Contiguous axial images were obtained from the base of the skull through the vertex without intravenous contrast. COMPARISON:  10/29/2020 FINDINGS: Brain: Mild chronic atrophic changes are noted. There are encephalomalacia changes identified in the left frontal parietal region consistent with prior infarct. Mild ex vacuo dilatation of the left lateral  ventricle is seen. No acute hemorrhage, acute infarction or space-occupying mass lesion is noted. Prominent cisterna magna is noted posteriorly in the posterior fossa. Vascular: No hyperdense vessel or unexpected calcification. Skull: Normal. Negative for fracture or focal lesion. Sinuses/Orbits: No acute finding. Other: None. IMPRESSION: Stable left-sided frontal parietal infarct similar to that noted on the prior exam. Chronic atrophic changes without acute abnormality. Electronically Signed   By: Inez Catalina M.D.   On: 11/13/2020 21:09        Scheduled Meds:   stroke: mapping our early stages of recovery book   Does not apply Once   atorvastatin  80 mg Oral Daily   carvedilol  6.25 mg Oral BID WC   furosemide  40 mg Intravenous BID   pantoprazole  40 mg Oral Daily   potassium chloride  20 mEq Oral Daily   rivaroxaban  15 mg Oral Q supper   vitamin B-12  100 mcg Oral Daily   Continuous Infusions:  cefTRIAXone (ROCEPHIN)  IV 1 g (11/14/20 2345)   ferric gluconate (FERRLECIT) IVPB       LOS: 1 day   Time spent= 35 mins    Alfred Eckley Arsenio Loader, MD [facility]  If 7PM-7AM, please contact night-coverage  11/15/2020, 8:54 AM

## 2020-11-15 NOTE — Progress Notes (Signed)
Remote ICD transmission.   

## 2020-11-16 DIAGNOSIS — G9341 Metabolic encephalopathy: Secondary | ICD-10-CM

## 2020-11-16 DIAGNOSIS — N3000 Acute cystitis without hematuria: Secondary | ICD-10-CM

## 2020-11-16 LAB — CBC
HCT: 29.2 % — ABNORMAL LOW (ref 39.0–52.0)
Hemoglobin: 8.9 g/dL — ABNORMAL LOW (ref 13.0–17.0)
MCH: 26.6 pg (ref 26.0–34.0)
MCHC: 30.5 g/dL (ref 30.0–36.0)
MCV: 87.2 fL (ref 80.0–100.0)
Platelets: 190 10*3/uL (ref 150–400)
RBC: 3.35 MIL/uL — ABNORMAL LOW (ref 4.22–5.81)
RDW: 17.2 % — ABNORMAL HIGH (ref 11.5–15.5)
WBC: 4.6 10*3/uL (ref 4.0–10.5)
nRBC: 0 % (ref 0.0–0.2)

## 2020-11-16 LAB — URINE CULTURE: Culture: >=100000 — AB

## 2020-11-16 LAB — MAGNESIUM: Magnesium: 2.2 mg/dL (ref 1.7–2.4)

## 2020-11-16 LAB — BASIC METABOLIC PANEL
Anion gap: 6 (ref 5–15)
BUN: 26 mg/dL — ABNORMAL HIGH (ref 8–23)
CO2: 25 mmol/L (ref 22–32)
Calcium: 8.6 mg/dL — ABNORMAL LOW (ref 8.9–10.3)
Chloride: 104 mmol/L (ref 98–111)
Creatinine, Ser: 1.47 mg/dL — ABNORMAL HIGH (ref 0.61–1.24)
GFR, Estimated: 48 mL/min — ABNORMAL LOW (ref >60)
Glucose, Bld: 120 mg/dL — ABNORMAL HIGH (ref 70–99)
Potassium: 3.6 mmol/L (ref 3.5–5.1)
Sodium: 135 mmol/L (ref 135–145)

## 2020-11-16 MED ORDER — FUROSEMIDE 40 MG PO TABS
60.0000 mg | ORAL_TABLET | Freq: Two times a day (BID) | ORAL | Status: DC
  Administered 2020-11-16 – 2020-11-21 (×7): 60 mg via ORAL
  Filled 2020-11-16 (×10): qty 1

## 2020-11-16 MED ORDER — CIPROFLOXACIN HCL 500 MG PO TABS
500.0000 mg | ORAL_TABLET | Freq: Two times a day (BID) | ORAL | Status: AC
  Administered 2020-11-16 – 2020-11-21 (×10): 500 mg via ORAL
  Filled 2020-11-16 (×10): qty 1

## 2020-11-16 MED ORDER — POTASSIUM CHLORIDE CRYS ER 20 MEQ PO TBCR
20.0000 meq | EXTENDED_RELEASE_TABLET | Freq: Once | ORAL | Status: AC
  Administered 2020-11-16: 20 meq via ORAL
  Filled 2020-11-16: qty 1

## 2020-11-16 NOTE — Progress Notes (Signed)
PROGRESS NOTE    Mark Clayton  [id number] DOB: 06/06/1940 DOA: 11/13/2020 PCP: [name], MD   Brief Narrative:   80 y.o. M with sCHF EF <20%, AICD, stroke in 2021 with R hemiparesis, HTN, AF on Xarelto who presented with 3 days progressive weakness and coonfusion in setting of 2-3 weeks of CHF flare. In the ER, UA showed maybe pyuria.  Legs swollen, effusion on CXR.  Admitted for rule out stroke and also UTI.  Neurology team was consulted, CT head was negative for any for it.  Suspected altered mental status due to metabolic issues therefore stroke team signed off with recommendations to continue Xarelto.  Urine cultures grew gram-negative rods.  Required diuresis with IV Lasix which was transitioned to home p.o.   Assessment & Plan:   Principal Problem:   Stroke-like symptoms Active Problems:   Chronic systolic heart failure (HCC)   Cardiac defibrillator  MDT VVI   Persistent atrial fibrillation   Essential hypertension   Dysarthria   Hemiplegia of right dominant side as late effect of cerebral infarction (HCC)   Hallucination   Pleural effusion due to CHF (congestive heart failure) (HCC)   Acute metabolic encephalopathy   Acute metabolic encephalopathy UTI; gram-negative rods-Enterobacter cloacae  - Stroke work-up negative thus far.  Likely infectious. - Ammonia normal, TSH/T4 mildly elevated, recheck in 3-4 weeks -Transition to oral ciprofloxacin for another 5 days- 8/25   Acute CHF with reduced EF, EF < 20%, class III Bilateral pleural effusions CAD s/p CABG ICD in place.  - Transition to home p.o.-Lasix 60 mg twice daily.  Potassium supplements. - Daily weight, monitor electrolytes. - Refused hospital admission previously.  Palliative team consulted.  Family agreeable to get in touch with hospice.  Normocytic anemia, iron deficiency Hx of Colon cancer s/p resection.  - 1 more dose of IV iron today followed by p.o. starting tomorrow with bowel regimen.  Family doesn't want to investigate this further at this time.    Persistent atrial fibrillation -Continue Xarelto and Coreg   Cerebrovascular disease, secondary prevention Hypertension Hemiplegia -Soft blood pressure.  Continue statin, Coreg, Xarelto   GERD -Continue PPI  Palliative care team planning to meet with family tomorrow or Monday to help establish further goals of care.  PT/OT-recommending home health Speech and swallow-regular diet    DVT prophylaxis:  Rivaroxaban (XARELTO) tablet 15 mg  Code Status: DNR Family Communication: Sister at bedside  Status is: Inpatient  Remains inpatient appropriate because:Inpatient level of care appropriate due to severity of illness  Dispo: The patient is from: Home              Anticipated d/c is to: Home              Patient currently is not medically stable to d/c.  Mild exertional dyspnea but will need to slow down on diuresis therefore transition to oral Lasix.  Getting IV iron today, planned family meeting by palliative tomorrow.  Hopefully home in next 24-48 hours   Difficult to place patient No     Subjective: Patient is pleasantly confused, answering basic questions but hallucinating a little bit.  Per sister was at bedside he was doing well earlier and last night.  No other complaints at this time.  Family requesting to speak with hospice services as they are likely interested in transitioning home with hospice.  Review of Systems Otherwise negative except as per HPI, including: General: Denies fever, chills, night sweats or unintended weight  loss. Resp: Denies cough, wheezing, shortness of breath. Cardiac: Denies chest pain, palpitations, orthopnea, paroxysmal nocturnal dyspnea. GI: Denies abdominal pain, nausea, vomiting, diarrhea or constipation GU: Denies dysuria, frequency, hesitancy or incontinence MS: Denies muscle aches, joint pain or swelling Neuro: Denies headache, neurologic deficits (focal weakness,  numbness, tingling), abnormal gait Psych: Denies anxiety, depression, SI/HI/AVH Skin: Denies new rashes or lesions ID: Denies sick contacts, exotic exposures, travel  Examination: Constitutional: Not in acute distress, elderly frail Respiratory: Clear to auscultation bilaterally Cardiovascular: Normal sinus rhythm, no rubs Abdomen: Nontender nondistended good bowel sounds Musculoskeletal: No edema noted Skin: No rashes seen Neurologic: CN 2-12 grossly intact.  And nonfocal Psychiatric: Poor judgment and insight.  Alert to name Objective: Vitals:   11/15/20 1650 11/15/20 1943 11/16/20 0004 11/16/20 0403  BP:  (!) 101/53 112/72 113/73  Pulse: 68 72 81 78  Resp:  '18 18 19  '$ Temp: 97.7 F (36.5 C) 97.6 F (36.4 C) 97.6 F (36.4 C) 97.6 F (36.4 C)  TempSrc: Oral  Oral Oral  SpO2: 100% 100% 100% 100%  Weight:      Height:        Intake/Output Summary (Last 24 hours) at 11/16/2020 0807 Last data filed at 11/16/2020 0455 Gross per 24 hour  Intake 960.08 ml  Output 1700 ml  Net -739.92 ml   Filed Weights   11/13/20 1943  Weight: 61.7 kg     Data Reviewed:   CBC: Recent Labs  Lab 11/13/20 2008 11/14/20 1520 11/15/20 0532 11/16/20 0214  WBC 4.0 4.7 5.2 4.6  NEUTROABS 2.7  --   --   --   HGB 8.5* 8.8* 9.0* 8.9*  HCT 29.9* 28.4* 29.6* 29.2*  MCV 91.7 87.1 87.6 87.2  PLT 215 213 208 [id number]   Basic Metabolic Panel: Recent Labs  Lab 11/13/20 2008 11/14/20 1520 11/15/20 0532 11/16/20 0214  NA 136 139 138 135  K 3.9 4.2 4.0 3.6  CL 108 107 105 104  CO2 20* '25 24 25  '$ GLUCOSE 104* 122* 125* 120*  BUN 31* 33* 30* 26*  CREATININE 1.17 1.35* 1.35* 1.47*  CALCIUM 8.0* 8.8* 9.0 8.6*  MG 1.8  --   --  2.2   GFR: Estimated Creatinine Clearance: 35.6 mL/min (A) (by C-G formula based on SCr of 1.47 mg/dL (H)). Liver Function Tests: Recent Labs  Lab 11/13/20 2008  AST 26  ALT 14  ALKPHOS 62  BILITOT 2.1*  PROT 5.1*  ALBUMIN 2.8*   No results for input(s):  LIPASE, AMYLASE in the last 168 hours. Recent Labs  Lab 11/14/20 1520  AMMONIA 19   Coagulation Profile: No results for input(s): INR, PROTIME in the last 168 hours. Cardiac Enzymes: No results for input(s): CKTOTAL, CKMB, CKMBINDEX, TROPONINI in the last 168 hours. BNP (last 3 results) No results for input(s): PROBNP in the last 8760 hours. HbA1C: Recent Labs    11/14/20 0305  HGBA1C 6.7*   CBG: No results for input(s): GLUCAP in the last 168 hours. Lipid Profile: Recent Labs    11/14/20 0305  CHOL 78  HDL 31*  LDLCALC 36  TRIG 57  CHOLHDL 2.5   Thyroid Function Tests: Recent Labs    11/14/20 1520 11/15/20 0937  TSH 10.380*  --   FREET4  --  1.17*   Anemia Panel: Recent Labs    11/14/20 1520  VITAMINB12 1,455*  FERRITIN 22*  TIBC 424  IRON 17*   Sepsis Labs: No results for input(s): PROCALCITON, LATICACIDVEN in the  last 168 hours.  Recent Results (from the past 240 hour(s))  Resp Panel by RT-PCR (Flu A&B, Covid) Nasopharyngeal Swab     Status: None   Collection Time: 11/13/20  8:30 PM   Specimen: Nasopharyngeal Swab; Nasopharyngeal(NP) swabs in vial transport medium  Result Value Ref Range Status   SARS Coronavirus 2 by RT PCR NEGATIVE NEGATIVE Final    Comment: (NOTE) SARS-CoV-2 target nucleic acids are NOT DETECTED.  The SARS-CoV-2 RNA is generally detectable in upper respiratory specimens during the acute phase of infection. The lowest concentration of SARS-CoV-2 viral copies this assay can detect is 138 copies/mL. A negative result does not preclude SARS-Cov-2 infection and should not be used as the sole basis for treatment or other patient management decisions. A negative result may occur with  improper specimen collection/handling, submission of specimen other than nasopharyngeal swab, presence of viral mutation(s) within the areas targeted by this assay, and inadequate number of viral copies(<138 copies/mL). A negative result must be  combined with clinical observations, patient history, and epidemiological information. The expected result is Negative.  Fact Sheet for Patients:  EntrepreneurPulse.com.au  Fact Sheet for Healthcare Providers:  IncredibleEmployment.be  This test is no t yet approved or cleared by the [place] FDA and  has been authorized for detection and/or diagnosis of SARS-CoV-2 by FDA under an Emergency Use Authorization (EUA). This EUA will remain  in effect (meaning this test can be used) for the duration of the COVID-19 declaration under Section 564(b)(1) of the Act, 21 U.S.C.section 360bbb-3(b)(1), unless the authorization is terminated  or revoked sooner.       Influenza A by PCR NEGATIVE NEGATIVE Final   Influenza B by PCR NEGATIVE NEGATIVE Final    Comment: (NOTE) The Xpert Xpress SARS-CoV-2/FLU/RSV plus assay is intended as an aid in the diagnosis of influenza from Nasopharyngeal swab specimens and should not be used as a sole basis for treatment. Nasal washings and aspirates are unacceptable for Xpert Xpress SARS-CoV-2/FLU/RSV testing.  Fact Sheet for Patients: EntrepreneurPulse.com.au  Fact Sheet for Healthcare Providers: IncredibleEmployment.be  This test is not yet approved or cleared by the [place] FDA and has been authorized for detection and/or diagnosis of SARS-CoV-2 by FDA under an Emergency Use Authorization (EUA). This EUA will remain in effect (meaning this test can be used) for the duration of the COVID-19 declaration under Section 564(b)(1) of the Act, 21 U.S.C. section 360bbb-3(b)(1), unless the authorization is terminated or revoked.  Performed at [facility], [place] [address], [place], [place] [address]   Urine Culture     Status: Abnormal (Preliminary result)   Collection Time: 11/13/20  8:53 PM   Specimen: Urine, Clean Catch  Result Value Ref Range Status   Specimen  Description URINE, CLEAN CATCH  Final   Special Requests NONE  Final   Culture (A)  Final    >=100,000 COLONIES/mL GRAM NEGATIVE RODS SUSCEPTIBILITIES TO FOLLOW Performed at [facility], [place] [address] [place], [place] [address]    Report Status PENDING  Incomplete  Blood culture (routine x 2)     Status: None (Preliminary result)   Collection Time: 11/13/20 10:11 PM   Specimen: BLOOD LEFT FOREARM  Result Value Ref Range Status   Specimen Description BLOOD LEFT FOREARM  Final   Special Requests   Final    BOTTLES DRAWN AEROBIC AND ANAEROBIC Blood Culture results may not be optimal due to an inadequate volume of blood received in culture bottles  Culture   Final    NO GROWTH 2 DAYS Performed at [facility], [place] [address], [place], [place] [address]    Report Status PENDING  Incomplete  Blood culture (routine x 2)     Status: None (Preliminary result)   Collection Time: 11/13/20 11:29 PM   Specimen: BLOOD RIGHT ARM  Result Value Ref Range Status   Specimen Description   Final    BLOOD RIGHT ARM Performed at [facility], [place] [place], [place] [address]    Special Requests   Final    BOTTLES DRAWN AEROBIC AND ANAEROBIC Blood Culture adequate volume Performed at [facility], [address] [place], [place] [address]    Culture   Final    NO GROWTH 1 DAY Performed at [facility], [place] [address], [place], [place] [address]    Report Status PENDING  Incomplete         Radiology Studies: No results found.      Scheduled Meds:   stroke: mapping our early stages of recovery book   Does not apply Once   atorvastatin  80 mg Oral Daily   carvedilol  6.25 mg Oral BID WC   [START ON 11/17/2020] ferrous sulfate  325 mg Oral BID WC   furosemide  60 mg Oral BID   pantoprazole  40 mg Oral Daily   potassium chloride  20 mEq Oral Daily   potassium chloride  20 mEq Oral Once   rivaroxaban  15 mg Oral Q supper    senna-docusate  1 tablet Oral QHS   vitamin B-12  100 mcg Oral Daily   Continuous Infusions:  cefTRIAXone (ROCEPHIN)  IV Stopped (11/15/20 2225)   ferric gluconate (FERRLECIT) IVPB Stopped (11/15/20 1221)     LOS: 2 days   Time spent= 35 mins    May Ozment Arsenio Loader, MD [facility]  If 7PM-7AM, please contact night-coverage  11/16/2020, 8:07 AM

## 2020-11-17 LAB — CBC
HCT: 34.1 % — ABNORMAL LOW (ref 39.0–52.0)
Hemoglobin: 9.7 g/dL — ABNORMAL LOW (ref 13.0–17.0)
MCH: 26.4 pg (ref 26.0–34.0)
MCHC: 28.4 g/dL — ABNORMAL LOW (ref 30.0–36.0)
MCV: 92.7 fL (ref 80.0–100.0)
Platelets: 248 10*3/uL (ref 150–400)
RBC: 3.68 MIL/uL — ABNORMAL LOW (ref 4.22–5.81)
RDW: 17.3 % — ABNORMAL HIGH (ref 11.5–15.5)
WBC: 5.8 10*3/uL (ref 4.0–10.5)
nRBC: 0 % (ref 0.0–0.2)

## 2020-11-17 LAB — MAGNESIUM: Magnesium: 2.1 mg/dL (ref 1.7–2.4)

## 2020-11-17 LAB — BASIC METABOLIC PANEL
Anion gap: 15 (ref 5–15)
BUN: 21 mg/dL (ref 8–23)
CO2: 17 mmol/L — ABNORMAL LOW (ref 22–32)
Calcium: 8.9 mg/dL (ref 8.9–10.3)
Chloride: 104 mmol/L (ref 98–111)
Creatinine, Ser: 1.43 mg/dL — ABNORMAL HIGH (ref 0.61–1.24)
GFR, Estimated: 50 mL/min — ABNORMAL LOW (ref >60)
Glucose, Bld: 152 mg/dL — ABNORMAL HIGH (ref 70–99)
Potassium: 3.3 mmol/L — ABNORMAL LOW (ref 3.5–5.1)
Sodium: 136 mmol/L (ref 135–145)

## 2020-11-17 MED ORDER — POTASSIUM CHLORIDE CRYS ER 20 MEQ PO TBCR
40.0000 meq | EXTENDED_RELEASE_TABLET | Freq: Once | ORAL | Status: AC
  Administered 2020-11-17: 40 meq via ORAL
  Filled 2020-11-17: qty 2

## 2020-11-17 NOTE — Progress Notes (Signed)
[facility] PROGRESS NOTE    Mark Clayton  [id number] DOB: 1940-07-16 DOA: 11/13/2020 PCP: [name], MD   Brief Narrative:   80 y.o. M with sCHF EF <20%, AICD, stroke in 2021 with R hemiparesis, HTN, AF on Xarelto who presented with 3 days progressive weakness and coonfusion in setting of 2-3 weeks of CHF flare. In the ER, UA showed maybe pyuria.  Legs swollen, effusion on CXR.  Admitted for rule out stroke and also UTI.  Neurology team was consulted, CT head was negative for any for it.  Suspected altered mental status due to metabolic issues therefore stroke team signed off with recommendations to continue Xarelto.  Urine cultures grew gram-negative rods.  Required diuresis with IV Lasix which was transitioned to home p.o.   Assessment & Plan:   Principal Problem:   Stroke-like symptoms Active Problems:   Chronic systolic heart failure (HCC)   Cardiac defibrillator  MDT VVI   Goals of care, counseling/discussion   Persistent atrial fibrillation   Essential hypertension   Dysarthria   Hemiplegia of right dominant side as late effect of cerebral infarction [facility])   Hallucination   Acute on chronic systolic congestive heart failure (HCC)   Pleural effusion due to CHF (congestive heart failure) (HCC)   Acute metabolic encephalopathy   Acute cystitis without hematuria   Acute metabolic encephalopathy UTI; gram-negative rods-Enterobacter cloacae  - Stroke work-up negative thus far.  Likely infectious.  Off-and-on having episodes of - Ammonia normal, TSH/T4 mildly elevated, recheck in 3-4 weeks - Now on oral Cipro, last 8/25   Acute CHF with reduced EF, EF < 20%, class III Bilateral pleural effusions CAD s/p CABG ICD in place.  - Lasix 60 mg p.o. twice daily, potassium supplements as needed - Daily weight, monitor electrolytes. - Palliative care service following  Normocytic anemia, iron deficiency Hx of Colon cancer s/p resection.  - Iron supplements with bowel regimen.   Family not interested in investigating this further   Persistent atrial fibrillation -Continue Xarelto and Coreg   Cerebrovascular disease, secondary prevention Hypertension Hemiplegia -Soft blood pressure.  Continue statin, Coreg, Xarelto   GERD -Continue PPI  Palliative care team following  PT/OT-recommending home health Speech and swallow-regular diet    DVT prophylaxis:  Rivaroxaban (XARELTO) tablet 15 mg  Code Status: DNR Family Communication: Called Melissa.  Status is: Inpatient  Remains inpatient appropriate because:Inpatient level of care appropriate due to severity of illness  Dispo: The patient is from: Home              Anticipated d/c is to: Home              Patient currently is not medically stable to d/c.  Mild exertional dyspnea but will need to slow down on diuresis therefore transition to oral Lasix.  Getting IV iron today, planned family meeting by palliative tomorrow.  Hopefully home in next 24-48 hours   Difficult to place patient No     Subjective: When I saw the patient this morning he was in mittens, answering appropriately but confused and delirious. Nursing staff message stating that he was not really responsive momentarily but without lasting for very short period time.  No evidence of seizure.  Review of Systems Otherwise negative except as per HPI, including: Difficult to obtain as he is delirious  Examination: Constitutional: Not in acute distress, elderly frail Respiratory: Clear to auscultation bilaterally Cardiovascular: Normal sinus rhythm, no rubs Abdomen: Nontender nondistended good bowel sounds Musculoskeletal: No  edema noted Skin: No rashes seen Neurologic: CN 2-12 grossly intact.  And nonfocal Psychiatric: Poor judgment and insight.  Answers basic questions appropriately but he is delirious Objective: Vitals:   11/16/20 2003 11/17/20 0330 11/17/20 0730 11/17/20 1105  BP: 108/72 108/76 (!) 112/91 119/83  Pulse: 86  (!)  114   Resp: '12 20 20 18  '$ Temp: 98.1 F (36.7 C) 98 F (36.7 C) 97.6 F (36.4 C) 98.2 F (36.8 C)  TempSrc: Axillary Axillary Axillary Axillary  SpO2: 100% 97% 100% 98%  Weight:      Height:        Intake/Output Summary (Last 24 hours) at 11/17/2020 1155 Last data filed at 11/17/2020 0330 Gross per 24 hour  Intake --  Output 500 ml  Net -500 ml   Filed Weights   11/13/20 1943  Weight: 61.7 kg     Data Reviewed:   CBC: Recent Labs  Lab 11/13/20 2008 11/14/20 1520 11/15/20 0532 11/16/20 0214 11/17/20 0744  WBC 4.0 4.7 5.2 4.6 5.8  NEUTROABS 2.7  --   --   --   --   HGB 8.5* 8.8* 9.0* 8.9* 9.7*  HCT 29.9* 28.4* 29.6* 29.2* 34.1*  MCV 91.7 87.1 87.6 87.2 92.7  PLT 215 213 208 190 [id number]   Basic Metabolic Panel: Recent Labs  Lab 11/13/20 2008 11/14/20 1520 11/15/20 0532 11/16/20 0214 11/17/20 0744  NA 136 139 138 135 136  K 3.9 4.2 4.0 3.6 3.3*  CL 108 107 105 104 104  CO2 20* '25 24 25 '$ 17*  GLUCOSE 104* 122* 125* 120* 152*  BUN 31* 33* 30* 26* 21  CREATININE 1.17 1.35* 1.35* 1.47* 1.43*  CALCIUM 8.0* 8.8* 9.0 8.6* 8.9  MG 1.8  --   --  2.2 2.1   GFR: Estimated Creatinine Clearance: 36.6 mL/min (A) (by C-G formula based on SCr of 1.43 mg/dL (H)). Liver Function Tests: Recent Labs  Lab 11/13/20 2008  AST 26  ALT 14  ALKPHOS 62  BILITOT 2.1*  PROT 5.1*  ALBUMIN 2.8*   No results for input(s): LIPASE, AMYLASE in the last 168 hours. Recent Labs  Lab 11/14/20 1520  AMMONIA 19   Coagulation Profile: No results for input(s): INR, PROTIME in the last 168 hours. Cardiac Enzymes: No results for input(s): CKTOTAL, CKMB, CKMBINDEX, TROPONINI in the last 168 hours. BNP (last 3 results) No results for input(s): PROBNP in the last 8760 hours. HbA1C: No results for input(s): HGBA1C in the last 72 hours.  CBG: No results for input(s): GLUCAP in the last 168 hours. Lipid Profile: No results for input(s): CHOL, HDL, LDLCALC, TRIG, CHOLHDL, LDLDIRECT in the  last 72 hours.  Thyroid Function Tests: Recent Labs    11/14/20 1520 11/15/20 0937  TSH 10.380*  --   FREET4  --  1.17*   Anemia Panel: Recent Labs    11/14/20 1520  VITAMINB12 1,455*  FERRITIN 22*  TIBC 424  IRON 17*   Sepsis Labs: No results for input(s): PROCALCITON, LATICACIDVEN in the last 168 hours.  Recent Results (from the past 240 hour(s))  Resp Panel by RT-PCR (Flu A&B, Covid) Nasopharyngeal Swab     Status: None   Collection Time: 11/13/20  8:30 PM   Specimen: Nasopharyngeal Swab; Nasopharyngeal(NP) swabs in vial transport medium  Result Value Ref Range Status   SARS Coronavirus 2 by RT PCR NEGATIVE NEGATIVE Final    Comment: (NOTE) SARS-CoV-2 target nucleic acids are NOT DETECTED.  The SARS-CoV-2 RNA is generally  detectable in upper respiratory specimens during the acute phase of infection. The lowest concentration of SARS-CoV-2 viral copies this assay can detect is 138 copies/mL. A negative result does not preclude SARS-Cov-2 infection and should not be used as the sole basis for treatment or other patient management decisions. A negative result may occur with  improper specimen collection/handling, submission of specimen other than nasopharyngeal swab, presence of viral mutation(s) within the areas targeted by this assay, and inadequate number of viral copies(<138 copies/mL). A negative result must be combined with clinical observations, patient history, and epidemiological information. The expected result is Negative.  Fact Sheet for Patients:  EntrepreneurPulse.com.au  Fact Sheet for Healthcare Providers:  IncredibleEmployment.be  This test is no t yet approved or cleared by the [place] FDA and  has been authorized for detection and/or diagnosis of SARS-CoV-2 by FDA under an Emergency Use Authorization (EUA). This EUA will remain  in effect (meaning this test can be used) for the duration of the COVID-19  declaration under Section 564(b)(1) of the Act, 21 U.S.C.section 360bbb-3(b)(1), unless the authorization is terminated  or revoked sooner.       Influenza A by PCR NEGATIVE NEGATIVE Final   Influenza B by PCR NEGATIVE NEGATIVE Final    Comment: (NOTE) The Xpert Xpress SARS-CoV-2/FLU/RSV plus assay is intended as an aid in the diagnosis of influenza from Nasopharyngeal swab specimens and should not be used as a sole basis for treatment. Nasal washings and aspirates are unacceptable for Xpert Xpress SARS-CoV-2/FLU/RSV testing.  Fact Sheet for Patients: EntrepreneurPulse.com.au  Fact Sheet for Healthcare Providers: IncredibleEmployment.be  This test is not yet approved or cleared by the [place] FDA and has been authorized for detection and/or diagnosis of SARS-CoV-2 by FDA under an Emergency Use Authorization (EUA). This EUA will remain in effect (meaning this test can be used) for the duration of the COVID-19 declaration under Section 564(b)(1) of the Act, 21 U.S.C. section 360bbb-3(b)(1), unless the authorization is terminated or revoked.  Performed at [facility], [place] [address] [place], [place] [address]   Urine Culture     Status: Abnormal   Collection Time: 11/13/20  8:53 PM   Specimen: Urine, Clean Catch  Result Value Ref Range Status   Specimen Description URINE, CLEAN CATCH  Final   Special Requests   Final    NONE Performed at [facility], [place] [address], [place], [place] [address]    Culture >=100,000 COLONIES/mL ENTEROBACTER CLOACAE (A)  Final   Report Status 11/16/2020 FINAL  Final   Organism ID, Bacteria ENTEROBACTER CLOACAE (A)  Final      Susceptibility   Enterobacter cloacae - MIC*    CEFAZOLIN >=64 RESISTANT Resistant     CEFEPIME <=0.12 SENSITIVE Sensitive     CIPROFLOXACIN <=0.25 SENSITIVE Sensitive     GENTAMICIN <=1 SENSITIVE Sensitive     IMIPENEM 0.5 SENSITIVE Sensitive     NITROFURANTOIN  32 SENSITIVE Sensitive     TRIMETH/SULFA <=20 SENSITIVE Sensitive     PIP/TAZO <=4 SENSITIVE Sensitive     * >=100,000 COLONIES/mL ENTEROBACTER CLOACAE  Blood culture (routine x 2)     Status: None (Preliminary result)   Collection Time: 11/13/20 10:11 PM   Specimen: BLOOD LEFT FOREARM  Result Value Ref Range Status   Specimen Description BLOOD LEFT FOREARM  Final   Special Requests   Final    BOTTLES DRAWN AEROBIC AND ANAEROBIC Blood Culture results may not be optimal due to an inadequate  volume of blood received in culture bottles   Culture   Final    NO GROWTH 3 DAYS Performed at [facility], [place] [address], [place], [place] [address]    Report Status PENDING  Incomplete  Blood culture (routine x 2)     Status: None (Preliminary result)   Collection Time: 11/13/20 11:29 PM   Specimen: BLOOD RIGHT ARM  Result Value Ref Range Status   Specimen Description   Final    BLOOD RIGHT ARM Performed at [facility], [address] [place], [place] [address]    Special Requests   Final    BOTTLES DRAWN AEROBIC AND ANAEROBIC Blood Culture adequate volume Performed at [facility], [address] [place], [place] [address]    Culture   Final    NO GROWTH 2 DAYS Performed at [facility], [place] [address], [place], [place] [address]    Report Status PENDING  Incomplete      Radiology Studies: No results found.   Scheduled Meds:   stroke: mapping our early stages of recovery book   Does not apply Once   atorvastatin  80 mg Oral Daily   carvedilol  6.25 mg Oral BID WC   ciprofloxacin  500 mg Oral BID   ferrous sulfate  325 mg Oral BID WC   furosemide  60 mg Oral BID   pantoprazole  40 mg Oral Daily   potassium chloride  20 mEq Oral Daily   potassium chloride  40 mEq Oral Once   rivaroxaban  15 mg Oral Q supper   senna-docusate  1 tablet Oral QHS   vitamin B-12  100 mcg Oral Daily   Continuous Infusions:    LOS: 3 days   Time spent= 35  mins    Adelheid Hoggard Arsenio Loader, MD [facility]  If 7PM-7AM, please contact night-coverage  11/17/2020, 11:55 AM

## 2020-11-17 NOTE — Progress Notes (Signed)
Daily Progress Note   Patient Name: Mark Clayton       Date: 11/17/2020 DOB: 04-28-1940  Age: 80 y.o. MRN#: [id number] Attending Physician: Mark Lack, MD Primary Care Physician: Mark Koch, MD Admit Date: 11/13/2020  Reason for Consultation/Follow-up: Establishing goals of care  Subjective: Medical records reviewed. Discussed with Mark Clayton and assessed patient at the bedside. He appears to have worsened, now responding with a growl-like noise and non-verbal. He stares off and does not make eye contact. Per Mark, he was recently agitated and attempting to get out of bed. Patient's MD and rapid response have been notified of this change.  I left a voicemail with patient's daughter Mark Clayton) to coordinate a family meeting for today or tomorrow. I then called patient's daughter Mark Clayton to provide updates and follow up on our initial goals of care conversation.   Mark Clayton confirms that she spoke with Mark Clayton this morning and understands his current condition. She feels that the patient is "up and down" with overall continued decline. Family is leaning towards a decision to proceed with hospice services upon discharge. Mark Clayton shares that they are waiting to hear from social work/hospice and I clarified that families will usually discuss with PMT to review options and goals of care before proceeding with a referral to hospice. We then discussed hospice philosophy. Mark Clayton shares that family's priority is preserving comfort and dignity at the patient's end of life. I briefly explained and offered outpatient hospice services and she confirms her understanding.  I then received a return call from Mark Clayton to discuss goals of care and options. Mark Clayton has received frequent updates and  confirms that family was having a difficult time providing increasing care needs for [name] before his admission. She is concerned that family can't take him home in his current state with limited hospice support.   I explained and offered outpatient palliative care and hospice services. Discussed the difference between these services in various settings, such as home, SNF and residential hospice. Mark Clayton understands that patient is not going to improve from his chronic conditions, but he is also not yet in his last two weeks of life. She wonders if this acute worsening mental status is due to a reversible/treatable condition such as sepsis, or if patient has had another stroke. Reviewed the option  to continue current interventions to determine whether he will improve enough to go home with hospice. Reviewed the option of transitioning to comfort care in the hospital.    Questions and concerns addressed. PMT will continue to support holistically.     Length of Stay: 3  Current Medications: Scheduled Meds:    stroke: mapping our early stages of recovery book   Does not apply Once   atorvastatin  80 mg Oral Daily   carvedilol  6.25 mg Oral BID WC   ciprofloxacin  500 mg Oral BID   ferrous sulfate  325 mg Oral BID WC   furosemide  60 mg Oral BID   pantoprazole  40 mg Oral Daily   potassium chloride  20 mEq Oral Daily   rivaroxaban  15 mg Oral Q supper   senna-docusate  1 tablet Oral QHS   vitamin B-12  100 mcg Oral Daily    Continuous Infusions:   PRN Meds: acetaminophen **OR** acetaminophen (TYLENOL) oral liquid 160 mg/5 mL **OR** acetaminophen, dextromethorphan-guaiFENesin, lip balm, meclizine, polyethylene glycol, traZODone  Physical Exam Vitals and nursing note reviewed.  Constitutional:      General: He is not in acute distress.    Appearance: He is ill-appearing.  Cardiovascular:     Rate and Rhythm: Tachycardia present.  Pulmonary:     Effort: Pulmonary effort is normal.   Neurological:     Mental Status: He is confused.  Psychiatric:        Attention and Perception: He is inattentive.        Speech: He is noncommunicative.        Behavior: Behavior is withdrawn.            Vital Signs: BP (!) 112/91 (BP Location: Left Arm)   Pulse (!) 114   Temp 97.6 F (36.4 C) (Axillary)   Resp 20   Ht '5\' 9"'$  (1.753 m)   Wt 61.7 kg   SpO2 100%   BMI 20.09 kg/m  SpO2: SpO2: 100 % O2 Device: O2 Device: Room Air O2 Flow Rate:    Intake/output summary:  Intake/Output Summary (Last 24 hours) at 11/17/2020 0947 Last data filed at 11/17/2020 0330 Gross per 24 hour  Intake --  Output 500 ml  Net -500 ml   LBM: Last BM Date: 11/14/20 Baseline Weight: Weight: 61.7 kg Most recent weight: Weight: 61.7 kg       Palliative Assessment/Data:       Patient Active Problem List   Diagnosis Date Noted   Acute cystitis without hematuria    Acute metabolic encephalopathy A999333   Stroke-like symptoms 11/13/2020   Pleural effusion due to CHF (congestive heart failure) ([place]) 11/13/2020   Acute on chronic systolic congestive heart failure ([place]) 11/05/2020   Chronic anticoagulation 11/05/2020   Ventricular tachycardia ([place]) 11/05/2020   Chronic cough 10/24/2020   Bilateral inguinal hernia 10/11/2020   Focal neurological deficit, onset greater than 24 hours 09/27/2020   Hallucination 09/27/2020   Pre-diabetes 09/27/2020   Anxiety 08/14/2020   Hemiplegia of right dominant side as late effect of cerebral infarction ([place]) 07/19/2020   Low urine output 07/05/2020   Hypotension 07/05/2020   Bradycardia 04/14/2020   Exudative age-related macular degeneration of left eye with inactive choroidal neovascularization ([place]) 02/13/2020   Exudative age-related macular degeneration of right eye with active choroidal neovascularization ([place]) 07/12/2019   Intermediate stage nonexudative age-related macular degeneration of left eye 07/12/2019   Hx of completed stroke 06/14/2019    Dysarthria  Tinnitus 08/26/2018   Essential hypertension 01/21/2018   Coronary artery disease involving native heart with angina pectoris ([place]) 10/19/2017   Persistent atrial fibrillation    Goals of care, counseling/discussion 01/10/2016   Ischemic cardiomyopathy    Personal history of colon cancer    Implantable cardioverter-defibrillator (ICD) in situ 03/09/2012   Microcytic anemia 01/12/2011   Cardiac defibrillator  MDT VVI 12/26/2010   Hyperlipidemia 123456   Chronic systolic heart failure ([place]) 11/26/2008    Palliative Care Assessment & Plan   Patient Profile: 80 y.o. male  with past medical history of  prior stroke with R hemiparesis in 2021, AICD placement, CHF with EF 20-25%, HTN admitted on 11/13/2020 with generalized weakness, confusion, in setting of 2-3 weeks of CHF flare. In the ER, UA showed maybe pyuria. Legs swollen, effusion on CXR.  Admitted for rule out stroke and also UTI.  Neurology team was consulted, CT head was negative for any for it.  Suspected altered mental status due to metabolic issues therefore stroke team signed off with recommendations to continue Xarelto.  Urine cultures grew gram-negative rods.  Required diuresis with IV Lasix which was transitioned to home p.o.   Palliative care has been consulted to assist with goals of care conversation.  Assessment: Acute metabolic encephalopathy, worsening Pleural effusion due to CHF with EF<20% UTI ICD in place Goals of care conversation  Recommendations/Plan: Continue current interventions - family are not ready to transition to full comfort care while admitted Ongoing discussions of disposition options - family feels they are unable to take patient home with hospice, considering SNF with hospice as he is likely not residential hospice eligible  Psychosocial and emotional support provided Ongoing support from PMT   Goals of Care and Additional Recommendations: Limitations on Scope of Treatment:  Minimize Medications  Prognosis:  < 6 months  Discharge Planning: To Be Determined  Care plan was discussed with patient's daughters Mark Clayton and Wess Botts LCSW, Debbie Swist NCM, Dr. Reesa Chew  Total time: 45 minutes  Greater than 50% of this time was spent in counseling and coordinating care related to the above assessment and plan.  Dorthy Cooler, PA-C Palliative Medicine Team Team phone # [phone]  Thank you for allowing the Palliative Medicine Team to assist in the care of this patient. Please utilize secure chat with additional questions, if there is no response within 30 minutes please call the above phone number.  Palliative Medicine Team providers are available by phone from 7am to 7pm daily and can be reached through the team cell phone.  Should this patient require assistance outside of these hours, please call the patient's attending physician.

## 2020-11-18 DIAGNOSIS — N3000 Acute cystitis without hematuria: Secondary | ICD-10-CM

## 2020-11-18 LAB — BASIC METABOLIC PANEL
Anion gap: 8 (ref 5–15)
BUN: 20 mg/dL (ref 8–23)
CO2: 22 mmol/L (ref 22–32)
Calcium: 9.1 mg/dL (ref 8.9–10.3)
Chloride: 108 mmol/L (ref 98–111)
Creatinine, Ser: 1.25 mg/dL — ABNORMAL HIGH (ref 0.61–1.24)
GFR, Estimated: 59 mL/min — ABNORMAL LOW (ref >60)
Glucose, Bld: 86 mg/dL (ref 70–99)
Potassium: 4.4 mmol/L (ref 3.5–5.1)
Sodium: 138 mmol/L (ref 135–145)

## 2020-11-18 LAB — CBC
HCT: 30.7 % — ABNORMAL LOW (ref 39.0–52.0)
Hemoglobin: 9.2 g/dL — ABNORMAL LOW (ref 13.0–17.0)
MCH: 26.5 pg (ref 26.0–34.0)
MCHC: 30 g/dL (ref 30.0–36.0)
MCV: 88.5 fL (ref 80.0–100.0)
Platelets: 220 10*3/uL (ref 150–400)
RBC: 3.47 MIL/uL — ABNORMAL LOW (ref 4.22–5.81)
RDW: 17.4 % — ABNORMAL HIGH (ref 11.5–15.5)
WBC: 5.4 10*3/uL (ref 4.0–10.5)
nRBC: 0.4 % — ABNORMAL HIGH (ref 0.0–0.2)

## 2020-11-18 LAB — MAGNESIUM: Magnesium: 2.1 mg/dL (ref 1.7–2.4)

## 2020-11-18 LAB — CULTURE, BLOOD (ROUTINE X 2): Culture: NO GROWTH

## 2020-11-18 NOTE — NC FL2 (Signed)
[place] LEVEL OF CARE SCREENING TOOL     IDENTIFICATION  Patient Name: Mark Clayton Birthdate: 1940-04-03 Sex: male Admission Date (Current Location): 11/13/2020  [facility] and [place] Number:  [employer] and Address:  [facility], [place] [address] [place], [place] [address]      Provider Number: [id number]  Attending Physician Name and Address:  Shelly Coss, MD  Relative Name and Phone Number:       Current Level of Care: Hospital Recommended Level of Care: [place] Prior Approval Number:    Date Approved/Denied:   PASRR Number: [id number]  Discharge Plan: SNF    Current Diagnoses: Patient Active Problem List   Diagnosis Date Noted   Acute cystitis without hematuria    Acute metabolic encephalopathy A999333   Stroke-like symptoms 11/13/2020   Pleural effusion due to CHF (congestive heart failure) ([place]) 11/13/2020   Acute on chronic systolic congestive heart failure ([place]) 11/05/2020   Chronic anticoagulation 11/05/2020   Ventricular tachycardia ([place]) 11/05/2020   Chronic cough 10/24/2020   Bilateral inguinal hernia 10/11/2020   Focal neurological deficit, onset greater than 24 hours 09/27/2020   Hallucination 09/27/2020   Pre-diabetes 09/27/2020   Anxiety 08/14/2020   Hemiplegia of right dominant side as late effect of cerebral infarction ([place]) 07/19/2020   Low urine output 07/05/2020   Hypotension 07/05/2020   Bradycardia 04/14/2020   Exudative age-related macular degeneration of left eye with inactive choroidal neovascularization ([place]) 02/13/2020   Exudative age-related macular degeneration of right eye with active choroidal neovascularization ([place]) 07/12/2019   Intermediate stage nonexudative age-related macular degeneration of left eye 07/12/2019   Hx of completed stroke 06/14/2019   Dysarthria    Tinnitus 08/26/2018   Essential hypertension 01/21/2018   Coronary artery disease  involving native heart with angina pectoris ([place]) 10/19/2017   Persistent atrial fibrillation    Goals of care, counseling/discussion 01/10/2016   Ischemic cardiomyopathy    Personal history of colon cancer    Implantable cardioverter-defibrillator (ICD) in situ 03/09/2012   Microcytic anemia 01/12/2011   Cardiac defibrillator  MDT VVI 12/26/2010   Hyperlipidemia 123456   Chronic systolic heart failure ([place]) 11/26/2008    Orientation RESPIRATION BLADDER Height & Weight     Self  Normal Incontinent Weight: 136 lb 0.4 oz (61.7 kg) Height:  '5\' 9"'$  (175.3 cm)  BEHAVIORAL SYMPTOMS/MOOD NEUROLOGICAL BOWEL NUTRITION STATUS      Incontinent Diet (regular)  AMBULATORY STATUS COMMUNICATION OF NEEDS Skin   Extensive Assist Verbally Skin abrasions                       Personal Care Assistance Level of Assistance  Bathing, Feeding, Dressing Bathing Assistance: Maximum assistance Feeding assistance: Maximum assistance Dressing Assistance: Maximum assistance     Functional Limitations Info  Speech     Speech Info: Impaired (dysarthria)    SPECIAL CARE FACTORS FREQUENCY  PT (By licensed PT), OT (By licensed OT)     PT Frequency: 5x/wk OT Frequency: 5x/wk            Contractures Contractures Info: Not present    Additional Factors Info  Code Status, Allergies Code Status Info: DNR Allergies Info: Buspar (Buspirone), Latex, Tape           Current Medications (11/18/2020):  This is the current hospital active medication list Current Facility-Administered Medications  Medication Dose Route Frequency Provider Last Rate Last Admin    stroke: mapping  our early stages of recovery book   Does not apply Once Alcario Drought, Jared M, DO       acetaminophen (TYLENOL) tablet 650 mg  650 mg Oral Q4H PRN Etta Quill, DO       Or   acetaminophen (TYLENOL) 160 MG/5ML solution 650 mg  650 mg Per Tube Q4H PRN Etta Quill, DO       Or   acetaminophen (TYLENOL) suppository 650  mg  650 mg Rectal Q4H PRN Etta Quill, DO       atorvastatin (LIPITOR) tablet 80 mg  80 mg Oral Daily Jennette Kettle M, DO   80 mg at 11/18/20 1106   carvedilol (COREG) tablet 6.25 mg  6.25 mg Oral BID WC Etta Quill, DO   6.25 mg at 11/17/20 1209   ciprofloxacin (CIPRO) tablet 500 mg  500 mg Oral BID Amin, Ankit Chirag, MD   500 mg at 11/18/20 1106   dextromethorphan-guaiFENesin (MUCINEX DM) 30-600 MG per 12 hr tablet 1 tablet  1 tablet Oral BID PRN Amin, Ankit Chirag, MD       ferrous sulfate tablet 325 mg  325 mg Oral BID WC Amin, Ankit Chirag, MD   325 mg at 11/18/20 1106   furosemide (LASIX) tablet 60 mg  60 mg Oral BID Amin, Ankit Chirag, MD   60 mg at 11/17/20 1210   lip balm (CARMEX) ointment   Topical PRN Edwin Dada, MD   Given at 11/14/20 1842   meclizine (ANTIVERT) tablet 25 mg  25 mg Oral Daily PRN Etta Quill, DO       pantoprazole (PROTONIX) EC tablet 40 mg  40 mg Oral Daily Jennette Kettle M, DO   40 mg at 11/18/20 1106   polyethylene glycol (MIRALAX / GLYCOLAX) packet 17 g  17 g Oral Daily PRN Etta Quill, DO       potassium chloride SA (KLOR-CON) CR tablet 20 mEq  20 mEq Oral Daily Jennette Kettle M, DO   20 mEq at 11/18/20 1106   Rivaroxaban (XARELTO) tablet 15 mg  15 mg Oral Q supper Karren Cobble, RPH   15 mg at 11/17/20 1709   senna-docusate (Senokot-S) tablet 1 tablet  1 tablet Oral QHS Amin, Ankit Chirag, MD   1 tablet at 11/17/20 2123   traZODone (DESYREL) tablet 50 mg  50 mg Oral QHS PRN Damita Lack, MD   50 mg at 11/17/20 2122   vitamin B-12 (CYANOCOBALAMIN) tablet 100 mcg  100 mcg Oral Daily Jennette Kettle M, DO   100 mcg at 11/18/20 1106     Discharge Medications: Please see discharge summary for a list of discharge medications.  Relevant Imaging Results:  Relevant Lab Results:   Additional Information SS#: [id number]  Geralynn Ochs, LCSW

## 2020-11-18 NOTE — Progress Notes (Signed)
Physical Therapy Treatment Patient Details Name: Mark Clayton MRN: [id number] DOB: 1940/12/27 Today's Date: 11/18/2020    History of Present Illness 80 y/o male presented to ED on 8/17 with complaints of generalized weakness since 8/16. Suggesting UTI and possible pneumonia/CHF. PMH: CVA with R hemi in 2021, AICD placement, CHF with EF 20-25%, HTN    PT Comments    Today's skilled session continued to focus on mobility progression and strengthening. Limited today due to pt agitation. Acute PT to continue during pt's hospital stay.    Follow Up Recommendations  Home health PT;Supervision/Assistance - 24 hour     Equipment Recommendations  None recommended by PT    Recommendations for Other Services       Precautions / Restrictions Precautions Precautions: Fall Restrictions Weight Bearing Restrictions: No    Mobility  Bed Mobility               General bed mobility comments: attempted to assist pt with rolling to come to sitting at edge of bed. Pt becoming agitated when trying to put on socks and then rolling. Stated "take them off, I'm ready for bed". After a few attempts with increased agitation, bed mobility/EOB held today. Pt did agree to ex's with LE's.         Cognition Arousal/Alertness: Awake/alert Behavior During Therapy: Anxious;Agitated Overall Cognitive Status: History of cognitive impairments - at baseline                       General Comments: Did well with command following - verbal and tactile cues needed.  Oriented to person, place "hospital", year with chioces, but not month.  Difficult to assess orienation to current situation. Became agitated if staff not understanding him. Was pointing at call light plate on wall, stating "turn it off". With increased time and leading questions was discovered pt was hot and wanted room cooler. PT thern going on to discribe a "fight" in his room over him and that's how is hand got hurt.       Exercises General Exercises - Lower Extremity Ankle Circles/Pumps: AROM;AAROM;Both;10 reps;Supine;Limitations Ankle Circles/Pumps Limitations: increased assistance needed with right>left foot Quad Sets: AROM;AAROM;Strengthening;Both;10 reps;Supine;Limitations Quad Sets Limitations: tactile cues/facilitation to task intially progressing to pt performed actively Heel Slides: AAROM;Strengthening;10 reps;Supine;Limitations Heel Slides Limitations: min AA with cues on controlled movements Hip ABduction/ADduction: AAROM;Both;10 reps;Supine;Limitations Hip Abduction/Adduction Limitations: min AA Straight Leg Raises: AAROM;Strengthening;Both;10 reps;Supine;Limitations Straight Leg Raises Limitations: min AA     Pertinent Vitals/Pain Pain Assessment: 0-10 Faces Pain Scale: Hurts a little bit Pain Location: left hand (pt rubbing it and stating "they hurt me" Pain Descriptors / Indicators: Grimacing;Other (Comment) (rubbing his hand) Pain Intervention(s): Monitored during session;Premedicated before session;Limited activity within patient's tolerance (RN aware of bruising on hand from blood draws and has addressed)     PT Goals (current goals can now be found in the care plan section) Acute Rehab PT Goals Patient Stated Goal: hope to go back home PT Goal Formulation: With patient Time For Goal Achievement: 11/28/20 Potential to Achieve Goals: Fair Progress towards PT goals: Progressing toward goals    Frequency    Min 2X/week      PT Plan Current plan remains appropriate    AM-PAC PT "6 Clicks" Mobility   Outcome Measure  Help needed turning from your back to your side while in a flat bed without using bedrails?: A Lot Help needed moving from lying on your back to sitting on the  side of a flat bed without using bedrails?: A Lot Help needed moving to and from a bed to a chair (including a wheelchair)?: Total Help needed standing up from a chair using your arms (e.g., wheelchair or  bedside chair)?: Total Help needed to walk in hospital room?: Total Help needed climbing 3-5 steps with a railing? : Total 6 Click Score: 8    End of Session   Activity Tolerance: Patient tolerated treatment well;Treatment limited secondary to agitation;No increased pain Patient left: in bed;with call bell/phone within reach;with bed alarm set Nurse Communication: Mobility status PT Visit Diagnosis: Unsteadiness on feet (R26.81);Muscle weakness (generalized) (M62.81);Difficulty in walking, not elsewhere classified (R26.2);Other symptoms and signs involving the nervous system (R29.898)     Time: [id number] PT Time Calculation (min) (ACUTE ONLY): 13 min  Charges:  $Therapeutic Exercise: 8-22 mins                    Willow Ora, PTA, [facility] Acute [employer] [phone] 11/18/20, 9:34 AM    Willow Ora 11/18/2020, 9:30 AM

## 2020-11-18 NOTE — Progress Notes (Signed)
PROGRESS NOTE    Mark Clayton  [id number] DOB: 01-02-1941 DOA: 11/13/2020 PCP: [name], MD   Chief Complain: Generalized weakness, dysuria  Brief Narrative:  Patient is a 80 year old male with history of systolic congestive heart failure with ejection fraction less than 20%, status post AICD, stroke with right hemiparesis, hypertension, atrial fibrillation on Xarelto who presented with 3 days history of progressive weakness, confusion.  Stroke was suspected on admission and his UA was also suspicious for UTI.  Neurology was consulted.  Altered mental status was completed to be secondary to metabolic issues including UTI, stroke team signed off.  Urine culture showed Enterobacter cloacae, treating with antibiotics.  He was also started on IV Lasix for volume overload..  Palliative care consulted and following for goals of care.  Assessment & Plan:   Principal Problem:   Stroke-like symptoms Active Problems:   Chronic systolic heart failure (HCC)   Cardiac defibrillator  MDT VVI   Goals of care, counseling/discussion   Persistent atrial fibrillation   Essential hypertension   Dysarthria   Hemiplegia of right dominant side as late effect of cerebral infarction [facility])   Hallucination   Acute on chronic systolic congestive heart failure (HCC)   Pleural effusion due to CHF (congestive heart failure) (HCC)   Acute metabolic encephalopathy   Acute cystitis without hematuria   Acute metabolic encephalopathy: Suspected to be from UTI and other multifactorial causes.  Remains confused.  Has generalized deconditioning Stroke was suspected initially, stroke work-up negative.  Neurology was following earlier, now signed off.  Ammonia level normal.  Abnormal TFT, we recommend recheck in 3 to 4 weeks.  UTI: Complain of dysuria in presentation.  Urine culture showed Enterobacter cloacae, on Cipro.  Acute on chronic congestive heart failure exacerbation: Last echo showed EF of  less than 20%, has chronic bilateral pleural effusions.  History of coronary disease, status post CABG, status post AICD. IV Lasix was started initially, now changed to oral 60 mg twice a day.  Continue to monitor input/output, daily weight  Iron deficiency anemia: Iron studies showed low level of iron.  Given dose of IV iron.  He needs to continue oral iron supplementation on discharge.  Permanent A. fib: Currently rate is controlled with Coreg.  On Xarelto for anticoagulation.  GERD: Continue PPI  History of stroke/debility/deconditioning: Has right-sided residual weakness.  Continue statin, Coreg, Xarelto.  PT/OT recommend home health on discharge  Goals of care: Elderly patient with multiple comorbidities.  Now confused/has generalized decline.  Palliative care following for goals of care.  DNR.  Palliative care discussing with family.  Plan is for skilled nursing facility with hospice services.           DVT prophylaxis: Xarelto Code Status: DnR Family Communication: None at bedside Status is: Inpatient  Remains inpatient appropriate because:Inpatient level of care appropriate due to severity of illness  Dispo: The patient is from: Home              Anticipated d/c is to: Home              Patient currently is not medically stable to d/c.   Difficult to place patient No    Consultants: Neurology, palliative care   Procedures:None  Antimicrobials:  Anti-infectives (From admission, onward)    Start     Dose/Rate Route Frequency Ordered Stop   11/16/20 2000  ciprofloxacin (CIPRO) tablet 500 mg        500 mg Oral 2 times  daily 11/16/20 1351 11/21/20 1959   11/14/20 2200  cefTRIAXone (ROCEPHIN) 1 g in sodium chloride 0.9 % 100 mL IVPB  Status:  Discontinued        1 g 200 mL/hr over 30 Minutes Intravenous Every 24 hours 11/13/20 2229 11/16/20 1351   11/13/20 2200  cefTRIAXone (ROCEPHIN) 1 g in sodium chloride 0.9 % 100 mL IVPB        1 g 200 mL/hr over 30 Minutes  Intravenous  Once 11/13/20 2156 11/13/20 2253   11/13/20 2200  azithromycin (ZITHROMAX) 500 mg in sodium chloride 0.9 % 250 mL IVPB        500 mg 250 mL/hr over 60 Minutes Intravenous  Once 11/13/20 2156 11/13/20 2358       Subjective: Patient seen and examined at the bedside this afternoon.  Hemodynamically stable.  He looked comfortable to me.  He was lying on the bed. Looks very deconditioned, chronically ill looking, malnourished.  Not in any kind of distress.  He knew the current day.  Eager to go home.  Denies any shortness of breath or dysuria.  Objective: Vitals:   11/17/20 2355 11/18/20 0432 11/18/20 0902 11/18/20 1105  BP: 105/63 110/74 106/62 104/76  Pulse: 89 80 76 94  Resp: '20 18 17 18  '$ Temp: 98.2 F (36.8 C) (!) 97.5 F (36.4 C) 98.5 F (36.9 C) (!) 97.5 F (36.4 C)  TempSrc: Oral Oral Oral Axillary  SpO2: 100% 98% 100% 94%  Weight:      Height:       No intake or output data in the 24 hours ending 11/18/20 1433 Filed Weights   11/13/20 1943  Weight: 61.7 kg    Examination:  General exam: Overall comfortable, not in distress, very deconditioned, chronically looking, malnourished HEENT: PERRL Respiratory system:  no wheezes or crackles  Cardiovascular system: S1 & S2 heard, RRR.  Gastrointestinal system: Abdomen is nondistended, soft and nontender. Central nervous system: Alert and awake.  Told correct day but could not tell month Extremities: No edema, no clubbing ,no cyanosis Skin: No rashes, no ulcers,no icterus      Data Reviewed: I have personally reviewed following labs and imaging studies  CBC: Recent Labs  Lab 11/13/20 2008 11/14/20 1520 11/15/20 0532 11/16/20 0214 11/17/20 0744 11/18/20 0059  WBC 4.0 4.7 5.2 4.6 5.8 5.4  NEUTROABS 2.7  --   --   --   --   --   HGB 8.5* 8.8* 9.0* 8.9* 9.7* 9.2*  HCT 29.9* 28.4* 29.6* 29.2* 34.1* 30.7*  MCV 91.7 87.1 87.6 87.2 92.7 88.5  PLT 215 213 208 190 248 [id number]   Basic Metabolic Panel: Recent  Labs  Lab 11/13/20 2008 11/14/20 1520 11/15/20 0532 11/16/20 0214 11/17/20 0744 11/18/20 0059  NA 136 139 138 135 136 138  K 3.9 4.2 4.0 3.6 3.3* 4.4  CL 108 107 105 104 104 108  CO2 20* '25 24 25 '$ 17* 22  GLUCOSE 104* 122* 125* 120* 152* 86  BUN 31* 33* 30* 26* 21 20  CREATININE 1.17 1.35* 1.35* 1.47* 1.43* 1.25*  CALCIUM 8.0* 8.8* 9.0 8.6* 8.9 9.1  MG 1.8  --   --  2.2 2.1 2.1   GFR: Estimated Creatinine Clearance: 41.8 mL/min (A) (by C-G formula based on SCr of 1.25 mg/dL (H)). Liver Function Tests: Recent Labs  Lab 11/13/20 2008  AST 26  ALT 14  ALKPHOS 62  BILITOT 2.1*  PROT 5.1*  ALBUMIN 2.8*   No results  for input(s): LIPASE, AMYLASE in the last 168 hours. Recent Labs  Lab 11/14/20 1520  AMMONIA 19   Coagulation Profile: No results for input(s): INR, PROTIME in the last 168 hours. Cardiac Enzymes: No results for input(s): CKTOTAL, CKMB, CKMBINDEX, TROPONINI in the last 168 hours. BNP (last 3 results) No results for input(s): PROBNP in the last 8760 hours. HbA1C: No results for input(s): HGBA1C in the last 72 hours. CBG: No results for input(s): GLUCAP in the last 168 hours. Lipid Profile: No results for input(s): CHOL, HDL, LDLCALC, TRIG, CHOLHDL, LDLDIRECT in the last 72 hours. Thyroid Function Tests: No results for input(s): TSH, T4TOTAL, FREET4, T3FREE, THYROIDAB in the last 72 hours. Anemia Panel: No results for input(s): VITAMINB12, FOLATE, FERRITIN, TIBC, IRON, RETICCTPCT in the last 72 hours. Sepsis Labs: No results for input(s): PROCALCITON, LATICACIDVEN in the last 168 hours.  Recent Results (from the past 240 hour(s))  Resp Panel by RT-PCR (Flu A&B, Covid) Nasopharyngeal Swab     Status: None   Collection Time: 11/13/20  8:30 PM   Specimen: Nasopharyngeal Swab; Nasopharyngeal(NP) swabs in vial transport medium  Result Value Ref Range Status   SARS Coronavirus 2 by RT PCR NEGATIVE NEGATIVE Final    Comment: (NOTE) SARS-CoV-2 target nucleic  acids are NOT DETECTED.  The SARS-CoV-2 RNA is generally detectable in upper respiratory specimens during the acute phase of infection. The lowest concentration of SARS-CoV-2 viral copies this assay can detect is 138 copies/mL. A negative result does not preclude SARS-Cov-2 infection and should not be used as the sole basis for treatment or other patient management decisions. A negative result may occur with  improper specimen collection/handling, submission of specimen other than nasopharyngeal swab, presence of viral mutation(s) within the areas targeted by this assay, and inadequate number of viral copies(<138 copies/mL). A negative result must be combined with clinical observations, patient history, and epidemiological information. The expected result is Negative.  Fact Sheet for Patients:  EntrepreneurPulse.com.au  Fact Sheet for Healthcare Providers:  IncredibleEmployment.be  This test is no t yet approved or cleared by the [place] FDA and  has been authorized for detection and/or diagnosis of SARS-CoV-2 by FDA under an Emergency Use Authorization (EUA). This EUA will remain  in effect (meaning this test can be used) for the duration of the COVID-19 declaration under Section 564(b)(1) of the Act, 21 U.S.C.section 360bbb-3(b)(1), unless the authorization is terminated  or revoked sooner.       Influenza A by PCR NEGATIVE NEGATIVE Final   Influenza B by PCR NEGATIVE NEGATIVE Final    Comment: (NOTE) The Xpert Xpress SARS-CoV-2/FLU/RSV plus assay is intended as an aid in the diagnosis of influenza from Nasopharyngeal swab specimens and should not be used as a sole basis for treatment. Nasal washings and aspirates are unacceptable for Xpert Xpress SARS-CoV-2/FLU/RSV testing.  Fact Sheet for Patients: EntrepreneurPulse.com.au  Fact Sheet for Healthcare Providers: IncredibleEmployment.be  This  test is not yet approved or cleared by the [place] FDA and has been authorized for detection and/or diagnosis of SARS-CoV-2 by FDA under an Emergency Use Authorization (EUA). This EUA will remain in effect (meaning this test can be used) for the duration of the COVID-19 declaration under Section 564(b)(1) of the Act, 21 U.S.C. section 360bbb-3(b)(1), unless the authorization is terminated or revoked.  Performed at [facility], [place] [address] [place], [place] [address]   Urine Culture     Status: Abnormal   Collection Time: 11/13/20  8:53 PM  Specimen: Urine, Clean Catch  Result Value Ref Range Status   Specimen Description URINE, CLEAN CATCH  Final   Special Requests   Final    NONE Performed at [facility], [address] [place], [place] [address]    Culture >=100,000 COLONIES/mL ENTEROBACTER CLOACAE (A)  Final   Report Status 11/16/2020 FINAL  Final   Organism ID, Bacteria ENTEROBACTER CLOACAE (A)  Final      Susceptibility   Enterobacter cloacae - MIC*    CEFAZOLIN >=64 RESISTANT Resistant     CEFEPIME <=0.12 SENSITIVE Sensitive     CIPROFLOXACIN <=0.25 SENSITIVE Sensitive     GENTAMICIN <=1 SENSITIVE Sensitive     IMIPENEM 0.5 SENSITIVE Sensitive     NITROFURANTOIN 32 SENSITIVE Sensitive     TRIMETH/SULFA <=20 SENSITIVE Sensitive     PIP/TAZO <=4 SENSITIVE Sensitive     * >=100,000 COLONIES/mL ENTEROBACTER CLOACAE  Blood culture (routine x 2)     Status: None   Collection Time: 11/13/20 10:11 PM   Specimen: BLOOD LEFT FOREARM  Result Value Ref Range Status   Specimen Description BLOOD LEFT FOREARM  Final   Special Requests   Final    BOTTLES DRAWN AEROBIC AND ANAEROBIC Blood Culture results may not be optimal due to an inadequate volume of blood received in culture bottles   Culture   Final    NO GROWTH 5 DAYS Performed at [facility], [place] [address] [place], [place] [address]    Report Status 11/18/2020 FINAL  Final  Blood culture  (routine x 2)     Status: None (Preliminary result)   Collection Time: 11/13/20 11:29 PM   Specimen: BLOOD RIGHT ARM  Result Value Ref Range Status   Specimen Description   Final    BLOOD RIGHT ARM Performed at [facility], [address] [place], [place] [address]    Special Requests   Final    BOTTLES DRAWN AEROBIC AND ANAEROBIC Blood Culture adequate volume Performed at [facility], [address], [place], [place] [address]    Culture   Final    NO GROWTH 4 DAYS Performed at [facility], [place] [address], [place], [place] [address]    Report Status PENDING  Incomplete         Radiology Studies: No results found.      Scheduled Meds:   stroke: mapping our early stages of recovery book   Does not apply Once   atorvastatin  80 mg Oral Daily   carvedilol  6.25 mg Oral BID WC   ciprofloxacin  500 mg Oral BID   ferrous sulfate  325 mg Oral BID WC   furosemide  60 mg Oral BID   pantoprazole  40 mg Oral Daily   potassium chloride  20 mEq Oral Daily   rivaroxaban  15 mg Oral Q supper   senna-docusate  1 tablet Oral QHS   vitamin B-12  100 mcg Oral Daily   Continuous Infusions:   LOS: 4 days    Time spent: 35 mins.More than 50% of that time was spent in counseling and/or coordination of care.      Shelly Coss, MD [facility] P8/22/2022, 2:33 PM

## 2020-11-18 NOTE — Progress Notes (Signed)
Occupational Therapy Treatment Patient Details Name: Mark Clayton MRN: [id number] DOB: 1941-01-22 Today's Date: 11/18/2020    History of present illness 80 y/o male presented to ED on 8/17 with complaints of generalized weakness since 8/16. Suggesting UTI and possible pneumonia/CHF. PMH: CVA with R hemi in 2021, AICD placement, CHF with EF 20-25%, HTN   OT comments  Pt received in bed, agreeable to bed level session only. Pt with intermittent lability this session, unclear reason. Pt pleasantly confused, upon OT arrival, pt tearful and stated "where did everyone go?". Pt reports seeing snakes in the room "last night". Pt required maxA to reposition in bed, bed placed in chair position, pt required setup assistance for grooming. Pt completed bed level BUE/BLE HEP, but was limited due to increasing agitation.   Updated d/c recommendation to SNF with supervision 24/7 secondary to Palliative note, indicating daughter's concern with family's ability to care for him at home.  Pt will continue to benefit from skilled OT services to maximize safety and independence with ADL/IADL and functional mobility. Will continue to follow acutely and progress as tolerated.    Follow Up Recommendations  SNF;Supervision/Assistance - 24 hour    Equipment Recommendations  None recommended by OT    Recommendations for Other Services      Precautions / Restrictions Precautions Precautions: Fall Restrictions Weight Bearing Restrictions: No       Mobility Bed Mobility Overal bed mobility: Needs Assistance             General bed mobility comments: maxA to boost higher in bed, pt assisting with LUE/LLE    Transfers                 General transfer comment: pt declined OOB mobility    Balance                                           ADL either performed or assessed with clinical judgement   ADL Overall ADL's : Needs assistance/impaired     Grooming: Set up;Bed  level;Oral care;Wash/dry face               Lower Body Dressing: Total assistance   Toilet Transfer: Total assistance             General ADL Comments: pt declined OOB mobility this date, agreeable to bed level session     Vision   Additional Comments: pt reports he is "blind"   Perception     Praxis      Cognition Arousal/Alertness: Awake/alert Behavior During Therapy: Anxious;Agitated Overall Cognitive Status: No family/caregiver present to determine baseline cognitive functioning                                 General Comments: Per chart, pt with hx of cognitive limitations. Pt oriented to person at this time. He was not oriented to place, pt stated he was at "the place where you learn things", pt reports having seen snakes in the room. Pt asking "where did everyone go", reports he is "waiting for my friends". communicated to RN Pt pleasantly confused this session with increased agitation with encouragement for BUE/BLE bed level exercises. Pt stated "I've been exercising for the past 4 months" and "we are done, I am satisfied".        Exercises Exercises:  General Upper Extremity;General Lower Extremity General Exercises - Upper Extremity Shoulder Flexion: AROM;Both;10 reps;Seated (supported bed) Shoulder Extension: AROM;Both;10 reps;Seated Elbow Flexion: AROM;Both;10 reps;Seated Elbow Extension: AROM;Both;10 reps;Seated General Exercises - Lower Extremity Ankle Circles/Pumps: AAROM;Both;10 reps;Supine;Limitations Ankle Circles/Pumps Limitations: increased assistance needed with right>left foot Heel Slides: AAROM;10 reps;Supine;Limitations Heel Slides Limitations: assist for control Hip ABduction/ADduction: AAROM;Both;10 reps;Supine;Limitations Hip Abduction/Adduction Limitations: min AA   Shoulder Instructions       General Comments vss    Pertinent Vitals/ Pain       Pain Assessment: Faces Faces Pain Scale: Hurts a little bit Pain  Location: left hand pt endorses pain with light touch Pain Descriptors / Indicators: Grimacing;Other (Comment) (rubbing his hand) Pain Intervention(s): Monitored during session;Limited activity within patient's tolerance  Home Living                                          Prior Functioning/Environment              Frequency  Min 2X/week        Progress Toward Goals  OT Goals(current goals can now be found in the care plan section)  Progress towards OT goals: Not progressing toward goals - comment (pt agreeable to bed level only)  Acute Rehab OT Goals Patient Stated Goal: hope to go back home OT Goal Formulation: With patient Time For Goal Achievement: 11/28/20 Potential to Achieve Goals: Fair ADL Goals Pt Will Perform Grooming: with set-up;sitting Pt Will Perform Upper Body Bathing: with set-up;sitting Pt Will Perform Upper Body Dressing: with set-up;sitting Pt Will Transfer to Toilet: with min assist;stand pivot transfer;bedside commode  Plan Discharge plan needs to be updated    Co-evaluation                 AM-PAC OT "6 Clicks" Daily Activity     Outcome Measure   Help from another person eating meals?: A Little Help from another person taking care of personal grooming?: A Little Help from another person toileting, which includes using toliet, bedpan, or urinal?: A Lot Help from another person bathing (including washing, rinsing, drying)?: A Lot Help from another person to put on and taking off regular upper body clothing?: A Lot Help from another person to put on and taking off regular lower body clothing?: A Lot 6 Click Score: 14    End of Session Equipment Utilized During Treatment: Gait belt;Rolling walker  OT Visit Diagnosis: Unsteadiness on feet (R26.81);Other abnormalities of gait and mobility (R26.89);Muscle weakness (generalized) (M62.81);Other symptoms and signs involving cognitive function;Hemiplegia and  hemiparesis Hemiplegia - Right/Left: Right Hemiplegia - dominant/non-dominant: Dominant   Activity Tolerance Patient limited by lethargy   Patient Left in bed;with call bell/phone within reach;with bed alarm set   Nurse Communication Mobility status        Time: [id number] OT Time Calculation (min): 16 min  Charges: OT General Charges $OT Visit: 1 Visit OT Treatments $Self Care/Home Management : 8-22 mins  Helene Kelp OTR/L Acute Rehabilitation Services Office: [phone]    Wyn Forster 11/18/2020, 3:34 PM

## 2020-11-18 NOTE — Care Management Important Message (Signed)
Important Message  Patient Details  Name: Mark Clayton MRN: [id number] Date of Birth: [birth year]   Medicare Important Message Given:  Yes     Orbie Pyo 11/18/2020, 3:27 PM

## 2020-11-18 NOTE — Progress Notes (Signed)
Daily Progress Note   Patient Name: Mark Clayton       Date: 11/18/2020 DOB: 1940/11/14  Age: 80 y.o. MRN#: [id number] Attending Physician: Shelly Coss, MD Primary Care Physician: [name], MD Admit Date: 11/13/2020  Reason for Consultation/Follow-up: Establishing goals of care  Subjective: Medical records reviewed. Patient is resting comfortably in bed. No family present at the bedside.  I called patient's daughter Mark Clayton to follow up on goals of care conversation. She expresses her concern for patient's hospital delirium and we reviewed that he is at increased risk due to advanced age, UTI, diminished physical functioning and demographics. She remains concerned that family is unable to care for him at home and would like to determine whether he can benefit from outpatient palliative care and SNF for rehab before then transitioning to hospice.   Counseled on the importance of patient's participation in PT/OT while he is admitted. Hopefully he will be able to demonstrate that he is a good candidate for SNF. During our conversation a few days ago before this decline, he was very motivated to resume therapies. Informed Melissa of the process for SNF placement if deemed appropriate. She verbalizes her understanding and confirms this is her goal at this time.  Questions and concerns addressed. PMT will continue to support holistically.     Length of Stay: 4  Current Medications: Scheduled Meds:    stroke: mapping our early stages of recovery book   Does not apply Once   atorvastatin  80 mg Oral Daily   carvedilol  6.25 mg Oral BID WC   ciprofloxacin  500 mg Oral BID   ferrous sulfate  325 mg Oral BID WC   furosemide  60 mg Oral BID   pantoprazole  40 mg Oral Daily    potassium chloride  20 mEq Oral Daily   rivaroxaban  15 mg Oral Q supper   senna-docusate  1 tablet Oral QHS   vitamin B-12  100 mcg Oral Daily    Continuous Infusions:   PRN Meds: acetaminophen **OR** acetaminophen (TYLENOL) oral liquid 160 mg/5 mL **OR** acetaminophen, dextromethorphan-guaiFENesin, lip balm, meclizine, polyethylene glycol, traZODone  Physical Exam Vitals and nursing note reviewed.  Constitutional:      General: He is not in acute distress. Cardiovascular:     Rate and Rhythm: Normal  rate.  Pulmonary:     Effort: Pulmonary effort is normal.  Neurological:     Mental Status: He is alert. He is confused.            Vital Signs: BP 104/76 (BP Location: Right Arm)   Pulse 94   Temp (!) 97.5 F (36.4 C) (Axillary)   Resp 18   Ht '5\' 9"'$  (1.753 m)   Wt 61.7 kg   SpO2 94%   BMI 20.09 kg/m  SpO2: SpO2: 94 % O2 Device: O2 Device: Room Air O2 Flow Rate:    Intake/output summary: No intake or output data in the 24 hours ending 11/18/20 1330  LBM: Last BM Date: 11/14/20 Baseline Weight: Weight: 61.7 kg Most recent weight: Weight: 61.7 kg  Patient Active Problem List   Diagnosis Date Noted   Acute cystitis without hematuria    Acute metabolic encephalopathy A999333   Stroke-like symptoms 11/13/2020   Pleural effusion due to CHF (congestive heart failure) ([place]) 11/13/2020   Acute on chronic systolic congestive heart failure ([place]) 11/05/2020   Chronic anticoagulation 11/05/2020   Ventricular tachycardia ([place]) 11/05/2020   Chronic cough 10/24/2020   Bilateral inguinal hernia 10/11/2020   Focal neurological deficit, onset greater than 24 hours 09/27/2020   Hallucination 09/27/2020   Pre-diabetes 09/27/2020   Anxiety 08/14/2020   Hemiplegia of right dominant side as late effect of cerebral infarction ([place]) 07/19/2020   Low urine output 07/05/2020   Hypotension 07/05/2020   Bradycardia 04/14/2020   Exudative age-related macular degeneration of left eye  with inactive choroidal neovascularization ([place]) 02/13/2020   Exudative age-related macular degeneration of right eye with active choroidal neovascularization ([place]) 07/12/2019   Intermediate stage nonexudative age-related macular degeneration of left eye 07/12/2019   Hx of completed stroke 06/14/2019   Dysarthria    Tinnitus 08/26/2018   Essential hypertension 01/21/2018   Coronary artery disease involving native heart with angina pectoris ([place]) 10/19/2017   Persistent atrial fibrillation    Goals of care, counseling/discussion 01/10/2016   Ischemic cardiomyopathy    Personal history of colon cancer    Implantable cardioverter-defibrillator (ICD) in situ 03/09/2012   Microcytic anemia 01/12/2011   Cardiac defibrillator  MDT VVI 12/26/2010   Hyperlipidemia 123456   Chronic systolic heart failure ([place]) 11/26/2008    Palliative Care Assessment & Plan   Patient Profile: 80 y.o. male  with past medical history of  prior stroke with R hemiparesis in 2021, AICD placement, CHF with EF 20-25%, HTN admitted on 11/13/2020 with generalized weakness, confusion, in setting of 2-3 weeks of CHF flare. In the ER, UA showed maybe pyuria. Legs swollen, effusion on CXR.  Admitted for rule out stroke and also UTI.  Neurology team was consulted, CT head was negative for any for it.  Suspected altered mental status due to metabolic issues therefore stroke team signed off with recommendations to continue Xarelto.  Urine cultures grew gram-negative rods.  Required diuresis with IV Lasix which was transitioned to home p.o.   Palliative care has been consulted to assist with goals of care conversation.  Assessment: Acute metabolic encephalopathy, worsening Pleural effusion due to CHF with EF<20% UTI ICD in place Goals of care conversation  Recommendations/Plan: Continue current interventions  Patient's daughter is hopeful that delirium will improve; she is now interested in SNF and outpatient palliative  care  Psychosocial and emotional support provided Ongoing support from PMT   Goals of Care and Additional Recommendations: Limitations on Scope of Treatment: Full Scope Treatment  Prognosis:  Guarded  Discharge Planning: To Be Determined  Care plan was discussed with patient's daughter Mark Clayton  Total time: 15 minutes Greater than 50% of this time was spent in counseling and coordinating care related to the above assessment and plan.  Dorthy Cooler, PA-C Palliative Medicine Team Team phone # [phone]  Thank you for allowing the Palliative Medicine Team to assist in the care of this patient. Please utilize secure chat with additional questions, if there is no response within 30 minutes please call the above phone number.  Palliative Medicine Team providers are available by phone from 7am to 7pm daily and can be reached through the team cell phone.  Should this patient require assistance outside of these hours, please call the patient's attending physician.

## 2020-11-18 NOTE — Progress Notes (Signed)
Zacarias Pontes [id number] [name]) Hospital Liaison note:  This is a pending outpatient-based Palliative Care patient. Will continue to follow for disposition.  Please call with any outpatient palliative questions or concerns.  Thank you, Lorelee Market, LPN [facility] Liaison [phone]

## 2020-11-18 NOTE — Progress Notes (Signed)
Heart Failure Navigator Progress Note  Assessed for Heart & Vascular TOC clinic readiness.  Patient does not meet criteria due to planned DC with hospice.   Navigator available for reassessment of patient.   Pricilla Holm, MSN, RN Heart Failure Nurse Navigator [phone]

## 2020-11-19 DIAGNOSIS — Z7189 Other specified counseling: Secondary | ICD-10-CM

## 2020-11-19 DIAGNOSIS — I5023 Acute on chronic systolic (congestive) heart failure: Secondary | ICD-10-CM

## 2020-11-19 DIAGNOSIS — I509 Heart failure, unspecified: Secondary | ICD-10-CM

## 2020-11-19 LAB — CBC
HCT: 33.5 % — ABNORMAL LOW (ref 39.0–52.0)
Hemoglobin: 10.1 g/dL — ABNORMAL LOW (ref 13.0–17.0)
MCH: 27.3 pg (ref 26.0–34.0)
MCHC: 30.1 g/dL (ref 30.0–36.0)
MCV: 90.5 fL (ref 80.0–100.0)
Platelets: 231 10*3/uL (ref 150–400)
RBC: 3.7 MIL/uL — ABNORMAL LOW (ref 4.22–5.81)
RDW: 17.9 % — ABNORMAL HIGH (ref 11.5–15.5)
WBC: 6.3 10*3/uL (ref 4.0–10.5)
nRBC: 0.5 % — ABNORMAL HIGH (ref 0.0–0.2)

## 2020-11-19 LAB — BASIC METABOLIC PANEL
Anion gap: 10 (ref 5–15)
BUN: 18 mg/dL (ref 8–23)
CO2: 19 mmol/L — ABNORMAL LOW (ref 22–32)
Calcium: 9 mg/dL (ref 8.9–10.3)
Chloride: 107 mmol/L (ref 98–111)
Creatinine, Ser: 1.3 mg/dL — ABNORMAL HIGH (ref 0.61–1.24)
GFR, Estimated: 56 mL/min — ABNORMAL LOW (ref >60)
Glucose, Bld: 134 mg/dL — ABNORMAL HIGH (ref 70–99)
Potassium: 4.6 mmol/L (ref 3.5–5.1)
Sodium: 136 mmol/L (ref 135–145)

## 2020-11-19 LAB — CULTURE, BLOOD (ROUTINE X 2)
Culture: NO GROWTH
Special Requests: ADEQUATE

## 2020-11-19 LAB — MAGNESIUM: Magnesium: 2.2 mg/dL (ref 1.7–2.4)

## 2020-11-19 NOTE — TOC Initial Note (Addendum)
Transition of Care [facility]) - Initial/Assessment Note    Patient Details  Name: Mark Clayton MRN: [id number] Date of Birth: 05-07-1940  Transition of [facility]) CM/SW Contact:    Pollie Friar, RN Phone Number: 11/19/2020, 4:51 PM  Clinical Narrative:                 CM called and spoke to patients daughter, [name] over the phone. She is interested in SNF with palliative care at d/c. She would like a facility in [place] if possible but also willing to look at [place]. Her first choice is [facility]. CM has asked [facility] to look at the referral.  TOC following.  Expected Discharge Plan: Skilled Nursing Facility Barriers to Discharge: SNF Pending bed offer, Insurance Authorization   Patient Goals and CMS Choice   CMS Medicare.gov Compare Post Acute Care list provided to:: Patient Represenative (must comment) Choice offered to / list presented to : Adult Children  Expected Discharge Plan and Services Expected Discharge Plan: Skilled Nursing Facility In-house Referral: Clinical Social Work Discharge Planning Services: CM Consult Post Acute Care Choice: [place]                                        Prior Living Arrangements/Services   Lives with:: Other (Comment) Patient language and need for interpreter reviewed:: Yes        Need for Family Participation in Patient Care: Yes (Comment) Care giver support system in place?: No (comment)   Criminal Activity/Legal Involvement Pertinent to Current Situation/Hospitalization: No - Comment as needed  Activities of Daily Living      Permission Sought/Granted                  Emotional Assessment Appearance:: Appears stated age         Psych Involvement: No (comment)  Admission diagnosis:  Acute on chronic systolic congestive heart failure (HCC) [I50.23] Acute cystitis without hematuria [N30.00] Stroke-like symptoms A999333 Acute metabolic encephalopathy  99991111 Patient Active Problem List   Diagnosis Date Noted   Acute cystitis without hematuria    Acute metabolic encephalopathy A999333   Stroke-like symptoms 11/13/2020   Pleural effusion due to CHF (congestive heart failure) ([place]) 11/13/2020   Acute on chronic systolic congestive heart failure ([place]) 11/05/2020   Chronic anticoagulation 11/05/2020   Ventricular tachycardia ([place]) 11/05/2020   Chronic cough 10/24/2020   Bilateral inguinal hernia 10/11/2020   Focal neurological deficit, onset greater than 24 hours 09/27/2020   Hallucination 09/27/2020   Pre-diabetes 09/27/2020   Anxiety 08/14/2020   Hemiplegia of right dominant side as late effect of cerebral infarction ([place]) 07/19/2020   Low urine output 07/05/2020   Hypotension 07/05/2020   Bradycardia 04/14/2020   Exudative age-related macular degeneration of left eye with inactive choroidal neovascularization ([place]) 02/13/2020   Exudative age-related macular degeneration of right eye with active choroidal neovascularization ([place]) 07/12/2019   Intermediate stage nonexudative age-related macular degeneration of left eye 07/12/2019   Hx of completed stroke 06/14/2019   Dysarthria    Tinnitus 08/26/2018   Essential hypertension 01/21/2018   Coronary artery disease involving native heart with angina pectoris ([place]) 10/19/2017   Persistent atrial fibrillation    Goals of care, counseling/discussion 01/10/2016   Ischemic cardiomyopathy    Personal history of colon cancer    Implantable cardioverter-defibrillator (ICD) in situ 03/09/2012   Microcytic anemia  01/12/2011   Cardiac defibrillator  MDT VVI 12/26/2010   Hyperlipidemia 123456   Chronic systolic heart failure ([place]) 11/26/2008   PCP:  [name], MD Pharmacy:   [facility]  ([place], [facility] [address] [place] [address] Phone: [phone] Fax: [phone]  [id number]  [place] [facility] [place] [address]Phone: [phone]: [phone]    Social Determinants of Health (SDOH) Interventions    Readmission Risk Interventions No flowsheet data found.

## 2020-11-19 NOTE — Progress Notes (Signed)
PROGRESS NOTE    Mark Clayton  [id number] DOB: 01/22/1941 DOA: 11/13/2020 PCP: [name], MD   Chief Complain: Generalized weakness, dysuria  Brief Narrative:  Patient is a 80 year old male with history of systolic congestive heart failure with ejection fraction less than 20%, status post AICD, stroke with right hemiparesis, hypertension, atrial fibrillation on Xarelto who presented with 3 days history of progressive weakness, confusion.  Stroke was suspected on admission and his UA was also suspicious for UTI.  Neurology was consulted.  Altered mental status was completed to be secondary to metabolic issues including UTI, stroke team signed off.  Urine culture showed Enterobacter cloacae, treating with antibiotics.  He was also started on IV Lasix for volume overload..  Palliative care consulted and  were following for goals of care.  Currently hemodynamically stable.  PT/OT recommending/facility.  Plan is for discharge to skilled nursing facility with palliative care follow-up.  Medically stable for discharge as soon as possible  Assessment & Plan:   Principal Problem:   Stroke-like symptoms Active Problems:   Chronic systolic heart failure (HCC)   Cardiac defibrillator  MDT VVI   Goals of care, counseling/discussion   Persistent atrial fibrillation   Essential hypertension   Dysarthria   Hemiplegia of right dominant side as late effect of cerebral infarction [facility])   Hallucination   Acute on chronic systolic congestive heart failure (HCC)   Pleural effusion due to CHF (congestive heart failure) (HCC)   Acute metabolic encephalopathy   Acute cystitis without hematuria   Acute metabolic encephalopathy: Suspected to be from UTI and other multifactorial causes.  Remains confused but may be back to baseline.  Has generalized deconditioning Stroke was suspected initially, stroke work-up negative.  Neurology was following earlier, now signed off.  Ammonia level normal.   Abnormal TFT, we recommend recheck in 3 to 4 weeks.  UTI: Complained of dysuria in presentation.  Urine culture showed Enterobacter cloacae, on Cipro.  Acute on chronic congestive heart failure exacerbation: Last echo showed EF of less than 20%, has chronic bilateral pleural effusions.  History of coronary disease, status post CABG, status post AICD. IV Lasix was started initially, now changed to oral 60 mg twice a day.  Continue to monitor input/output, daily weight  Iron deficiency anemia: Iron studies showed low level of iron.  Given dose of IV iron.  He needs to continue oral iron supplementation on discharge.  Permanent A. fib: Currently rate is controlled with Coreg.  On Xarelto for anticoagulation.  GERD: Continue PPI  History of stroke/debility/deconditioning: Has right-sided residual weakness.  Continue statin, Coreg, Xarelto.  PT/OT recommend SNF on discharge  Goals of care: Elderly patient with multiple comorbidities.  Confused/has generalized decline.  Palliative care were  following for goals of care.  DNR.  Plan is for skilled nursing facility with palliative care/hospice services.           DVT prophylaxis: Xarelto Code Status: DnR Family Communication:Called daughter on phone on 8/23, connection not successful Status is: Inpatient  Remains inpatient appropriate because:Inpatient level of care appropriate due to severity of illness  Dispo: The patient is from: Home              Anticipated d/c is to: SNF              Patient currently is not medically stable to d/c.   Difficult to place patient No    Consultants: Neurology, palliative care   Procedures:None  Antimicrobials:  Anti-infectives (From  admission, onward)    Start     Dose/Rate Route Frequency Ordered Stop   11/16/20 2000  ciprofloxacin (CIPRO) tablet 500 mg        500 mg Oral 2 times daily 11/16/20 1351 11/21/20 1959   11/14/20 2200  cefTRIAXone (ROCEPHIN) 1 g in sodium chloride 0.9 % 100 mL  IVPB  Status:  Discontinued        1 g 200 mL/hr over 30 Minutes Intravenous Every 24 hours 11/13/20 2229 11/16/20 1351   11/13/20 2200  cefTRIAXone (ROCEPHIN) 1 g in sodium chloride 0.9 % 100 mL IVPB        1 g 200 mL/hr over 30 Minutes Intravenous  Once 11/13/20 2156 11/13/20 2253   11/13/20 2200  azithromycin (ZITHROMAX) 500 mg in sodium chloride 0.9 % 250 mL IVPB        500 mg 250 mL/hr over 60 Minutes Intravenous  Once 11/13/20 2156 11/13/20 2358       Subjective: Patient seen and examined the bedside this morning.  Hemodynamically stable.  Alert, awake.  Coherent, obeys commands.  Denies any complaints  Objective: Vitals:   11/18/20 2001 11/18/20 2357 11/19/20 0413 11/19/20 0828  BP: 99/65 107/65 115/75 107/69  Pulse: 81 80 94 90  Resp: '18 19 17 17  '$ Temp: 98.9 F (37.2 C) 98.3 F (36.8 C) 97.7 F (36.5 C) 97.8 F (36.6 C)  TempSrc: Oral Oral Oral Oral  SpO2: 100% 100% 100% 100%  Weight:      Height:        Intake/Output Summary (Last 24 hours) at 11/19/2020 0837 Last data filed at 11/19/2020 0655 Gross per 24 hour  Intake --  Output 450 ml  Net -450 ml   Filed Weights   11/13/20 1943  Weight: 61.7 kg    Examination:  General exam: Overall comfortable, not in distress HEENT: PERRL Respiratory system:  no wheezes or crackles  Cardiovascular system: S1 & S2 heard, RRR.  Gastrointestinal system: Abdomen is nondistended, soft and nontender. Central nervous system: Alert and awake Extremities: No edema, no clubbing ,no cyanosis Skin: No rashes, no ulcers,no icterus        Data Reviewed: I have personally reviewed following labs and imaging studies  CBC: Recent Labs  Lab 11/13/20 2008 11/14/20 1520 11/15/20 0532 11/16/20 0214 11/17/20 0744 11/18/20 0059  WBC 4.0 4.7 5.2 4.6 5.8 5.4  NEUTROABS 2.7  --   --   --   --   --   HGB 8.5* 8.8* 9.0* 8.9* 9.7* 9.2*  HCT 29.9* 28.4* 29.6* 29.2* 34.1* 30.7*  MCV 91.7 87.1 87.6 87.2 92.7 88.5  PLT 215 213 208  190 248 [id number]   Basic Metabolic Panel: Recent Labs  Lab 11/13/20 2008 11/14/20 1520 11/15/20 0532 11/16/20 0214 11/17/20 0744 11/18/20 0059  NA 136 139 138 135 136 138  K 3.9 4.2 4.0 3.6 3.3* 4.4  CL 108 107 105 104 104 108  CO2 20* '25 24 25 '$ 17* 22  GLUCOSE 104* 122* 125* 120* 152* 86  BUN 31* 33* 30* 26* 21 20  CREATININE 1.17 1.35* 1.35* 1.47* 1.43* 1.25*  CALCIUM 8.0* 8.8* 9.0 8.6* 8.9 9.1  MG 1.8  --   --  2.2 2.1 2.1   GFR: Estimated Creatinine Clearance: 41.8 mL/min (A) (by C-G formula based on SCr of 1.25 mg/dL (H)). Liver Function Tests: Recent Labs  Lab 11/13/20 2008  AST 26  ALT 14  ALKPHOS 62  BILITOT 2.1*  PROT 5.1*  ALBUMIN 2.8*   No results for input(s): LIPASE, AMYLASE in the last 168 hours. Recent Labs  Lab 11/14/20 1520  AMMONIA 19   Coagulation Profile: No results for input(s): INR, PROTIME in the last 168 hours. Cardiac Enzymes: No results for input(s): CKTOTAL, CKMB, CKMBINDEX, TROPONINI in the last 168 hours. BNP (last 3 results) No results for input(s): PROBNP in the last 8760 hours. HbA1C: No results for input(s): HGBA1C in the last 72 hours. CBG: No results for input(s): GLUCAP in the last 168 hours. Lipid Profile: No results for input(s): CHOL, HDL, LDLCALC, TRIG, CHOLHDL, LDLDIRECT in the last 72 hours. Thyroid Function Tests: No results for input(s): TSH, T4TOTAL, FREET4, T3FREE, THYROIDAB in the last 72 hours. Anemia Panel: No results for input(s): VITAMINB12, FOLATE, FERRITIN, TIBC, IRON, RETICCTPCT in the last 72 hours. Sepsis Labs: No results for input(s): PROCALCITON, LATICACIDVEN in the last 168 hours.  Recent Results (from the past 240 hour(s))  Resp Panel by RT-PCR (Flu A&B, Covid) Nasopharyngeal Swab     Status: None   Collection Time: 11/13/20  8:30 PM   Specimen: Nasopharyngeal Swab; Nasopharyngeal(NP) swabs in vial transport medium  Result Value Ref Range Status   SARS Coronavirus 2 by RT PCR NEGATIVE NEGATIVE Final     Comment: (NOTE) SARS-CoV-2 target nucleic acids are NOT DETECTED.  The SARS-CoV-2 RNA is generally detectable in upper respiratory specimens during the acute phase of infection. The lowest concentration of SARS-CoV-2 viral copies this assay can detect is 138 copies/mL. A negative result does not preclude SARS-Cov-2 infection and should not be used as the sole basis for treatment or other patient management decisions. A negative result may occur with  improper specimen collection/handling, submission of specimen other than nasopharyngeal swab, presence of viral mutation(s) within the areas targeted by this assay, and inadequate number of viral copies(<138 copies/mL). A negative result must be combined with clinical observations, patient history, and epidemiological information. The expected result is Negative.  Fact Sheet for Patients:  EntrepreneurPulse.com.au  Fact Sheet for Healthcare Providers:  IncredibleEmployment.be  This test is no t yet approved or cleared by the [place] FDA and  has been authorized for detection and/or diagnosis of SARS-CoV-2 by FDA under an Emergency Use Authorization (EUA). This EUA will remain  in effect (meaning this test can be used) for the duration of the COVID-19 declaration under Section 564(b)(1) of the Act, 21 U.S.C.section 360bbb-3(b)(1), unless the authorization is terminated  or revoked sooner.       Influenza A by PCR NEGATIVE NEGATIVE Final   Influenza B by PCR NEGATIVE NEGATIVE Final    Comment: (NOTE) The Xpert Xpress SARS-CoV-2/FLU/RSV plus assay is intended as an aid in the diagnosis of influenza from Nasopharyngeal swab specimens and should not be used as a sole basis for treatment. Nasal washings and aspirates are unacceptable for Xpert Xpress SARS-CoV-2/FLU/RSV testing.  Fact Sheet for Patients: EntrepreneurPulse.com.au  Fact Sheet for Healthcare  Providers: IncredibleEmployment.be  This test is not yet approved or cleared by the [place] FDA and has been authorized for detection and/or diagnosis of SARS-CoV-2 by FDA under an Emergency Use Authorization (EUA). This EUA will remain in effect (meaning this test can be used) for the duration of the COVID-19 declaration under Section 564(b)(1) of the Act, 21 U.S.C. section 360bbb-3(b)(1), unless the authorization is terminated or revoked.  Performed at [facility], [place] [address], [place], [place] [address]   Urine Culture     Status: Abnormal  Collection Time: 11/13/20  8:53 PM   Specimen: Urine, Clean Catch  Result Value Ref Range Status   Specimen Description URINE, CLEAN CATCH  Final   Special Requests   Final    NONE Performed at [facility], [place] [address] [place], [place] [address]    Culture >=100,000 COLONIES/mL ENTEROBACTER CLOACAE (A)  Final   Report Status 11/16/2020 FINAL  Final   Organism ID, Bacteria ENTEROBACTER CLOACAE (A)  Final      Susceptibility   Enterobacter cloacae - MIC*    CEFAZOLIN >=64 RESISTANT Resistant     CEFEPIME <=0.12 SENSITIVE Sensitive     CIPROFLOXACIN <=0.25 SENSITIVE Sensitive     GENTAMICIN <=1 SENSITIVE Sensitive     IMIPENEM 0.5 SENSITIVE Sensitive     NITROFURANTOIN 32 SENSITIVE Sensitive     TRIMETH/SULFA <=20 SENSITIVE Sensitive     PIP/TAZO <=4 SENSITIVE Sensitive     * >=100,000 COLONIES/mL ENTEROBACTER CLOACAE  Blood culture (routine x 2)     Status: None   Collection Time: 11/13/20 10:11 PM   Specimen: BLOOD LEFT FOREARM  Result Value Ref Range Status   Specimen Description BLOOD LEFT FOREARM  Final   Special Requests   Final    BOTTLES DRAWN AEROBIC AND ANAEROBIC Blood Culture results may not be optimal due to an inadequate volume of blood received in culture bottles   Culture   Final    NO GROWTH 5 DAYS Performed at [facility], [place] [address] [place], [place]  [address]    Report Status 11/18/2020 FINAL  Final  Blood culture (routine x 2)     Status: None (Preliminary result)   Collection Time: 11/13/20 11:29 PM   Specimen: BLOOD RIGHT ARM  Result Value Ref Range Status   Specimen Description   Final    BLOOD RIGHT ARM Performed at [facility], [address] [place], [place] [address]    Special Requests   Final    BOTTLES DRAWN AEROBIC AND ANAEROBIC Blood Culture adequate volume Performed at [facility], [address] [place], [place] [address]    Culture   Final    NO GROWTH 4 DAYS Performed at [facility], [place] [address], [place], [place] [address]    Report Status PENDING  Incomplete         Radiology Studies: No results found.      Scheduled Meds:   stroke: mapping our early stages of recovery book   Does not apply Once   atorvastatin  80 mg Oral Daily   carvedilol  6.25 mg Oral BID WC   ciprofloxacin  500 mg Oral BID   ferrous sulfate  325 mg Oral BID WC   furosemide  60 mg Oral BID   pantoprazole  40 mg Oral Daily   potassium chloride  20 mEq Oral Daily   rivaroxaban  15 mg Oral Q supper   senna-docusate  1 tablet Oral QHS   vitamin B-12  100 mcg Oral Daily   Continuous Infusions:   LOS: 5 days    Time spent: 15 mins.More than 50% of that time was spent in counseling and/or coordination of care.      Shelly Coss, MD [facility] P8/23/2022, 8:37 AM

## 2020-11-20 LAB — BASIC METABOLIC PANEL
Anion gap: 13 (ref 5–15)
BUN: 18 mg/dL (ref 8–23)
CO2: 19 mmol/L — ABNORMAL LOW (ref 22–32)
Calcium: 9.1 mg/dL (ref 8.9–10.3)
Chloride: 104 mmol/L (ref 98–111)
Creatinine, Ser: 1.3 mg/dL — ABNORMAL HIGH (ref 0.61–1.24)
GFR, Estimated: 56 mL/min — ABNORMAL LOW (ref >60)
Glucose, Bld: 77 mg/dL (ref 70–99)
Potassium: 5 mmol/L (ref 3.5–5.1)
Sodium: 136 mmol/L (ref 135–145)

## 2020-11-20 LAB — CBC
HCT: 32.6 % — ABNORMAL LOW (ref 39.0–52.0)
Hemoglobin: 10 g/dL — ABNORMAL LOW (ref 13.0–17.0)
MCH: 27.2 pg (ref 26.0–34.0)
MCHC: 30.7 g/dL (ref 30.0–36.0)
MCV: 88.6 fL (ref 80.0–100.0)
Platelets: 241 10*3/uL (ref 150–400)
RBC: 3.68 MIL/uL — ABNORMAL LOW (ref 4.22–5.81)
RDW: 18.7 % — ABNORMAL HIGH (ref 11.5–15.5)
WBC: 5.5 10*3/uL (ref 4.0–10.5)
nRBC: 0.4 % — ABNORMAL HIGH (ref 0.0–0.2)

## 2020-11-20 LAB — MAGNESIUM: Magnesium: 2.1 mg/dL (ref 1.7–2.4)

## 2020-11-20 MED ORDER — COVID-19 MRNA VACC (MODERNA) 50 MCG/0.25ML IM SUSP
0.2500 mL | Freq: Once | INTRAMUSCULAR | Status: AC
  Administered 2020-11-20: 0.25 mL via INTRAMUSCULAR
  Filled 2020-11-20: qty 0.25

## 2020-11-20 NOTE — Plan of Care (Signed)
  Problem: Education: Goal: Knowledge of disease or condition will improve Outcome: Progressing Goal: Knowledge of secondary prevention will improve Outcome: Progressing Goal: Knowledge of patient specific risk factors addressed and post discharge goals established will improve Outcome: Progressing   Problem: Coping: Goal: Will verbalize positive feelings about self Outcome: Progressing Goal: Will identify appropriate support needs Outcome: Progressing   Problem: Health Behavior/Discharge Planning: Goal: Ability to manage health-related needs will improve Outcome: Progressing   Problem: Self-Care: Goal: Ability to participate in self-care as condition permits will improve Outcome: Progressing Goal: Verbalization of feelings and concerns over difficulty with self-care will improve Outcome: Progressing Goal: Ability to communicate needs accurately will improve Outcome: Progressing   Problem: Nutrition: Goal: Risk of aspiration will decrease Outcome: Progressing Goal: Dietary intake will improve Outcome: Progressing   Problem: Intracerebral Hemorrhage Tissue Perfusion: Goal: Complications of Intracerebral Hemorrhage will be minimized Outcome: Progressing   Problem: Ischemic Stroke/TIA Tissue Perfusion: Goal: Complications of ischemic stroke/TIA will be minimized Outcome: Progressing   Problem: Spontaneous Subarachnoid Hemorrhage Tissue Perfusion: Goal: Complications of Spontaneous Subarachnoid Hemorrhage will be minimized Outcome: Progressing   Problem: Education: Goal: Knowledge of General Education information will improve Description: Including pain rating scale, medication(s)/side effects and non-pharmacologic comfort measures Outcome: Progressing   Problem: Health Behavior/Discharge Planning: Goal: Ability to manage health-related needs will improve Outcome: Progressing   Problem: Clinical Measurements: Goal: Ability to maintain clinical measurements within  normal limits will improve Outcome: Progressing Goal: Will remain free from infection Outcome: Progressing Goal: Diagnostic test results will improve Outcome: Progressing Goal: Respiratory complications will improve Outcome: Progressing Goal: Cardiovascular complication will be avoided Outcome: Progressing   Problem: Activity: Goal: Risk for activity intolerance will decrease Outcome: Progressing   Problem: Nutrition: Goal: Adequate nutrition will be maintained Outcome: Progressing   Problem: Coping: Goal: Level of anxiety will decrease Outcome: Progressing   Problem: Elimination: Goal: Will not experience complications related to bowel motility Outcome: Progressing Goal: Will not experience complications related to urinary retention Outcome: Progressing   Problem: Pain Managment: Goal: General experience of comfort will improve Outcome: Progressing   Problem: Safety: Goal: Ability to remain free from injury will improve Outcome: Progressing   Problem: Skin Integrity: Goal: Risk for impaired skin integrity will decrease Outcome: Progressing   

## 2020-11-20 NOTE — TOC Progression Note (Addendum)
Transition of Care [facility]) - Progression Note    Patient Details  Name: DIMARCO HOLSTAD MRN: [id number] Date of Birth: 1940-11-08  Transition of Care [facility]) CM/SW Contact  Pollie Friar, RN Phone Number: 11/20/2020, 10:53 AM  Clinical Narrative:    [facility] facilities have declined to offer a bed. CM has called and updated pts daughter, [name]. She was provided the facilities that have offered a bed. She will updated CM of her choice after she reaches out to other family about which will be the best facility for her father. CM will then begin insurance authorization. TOC following.  [id number]: Daughter chose [facility]. CM has asked [facility] to begin insurance authorization. Pt has had 3 covid vaccines. Ron[facility] Lion is inquiring about him receiving the 4th. CM will f/u with daughter. Daughter in agreement with the 4th covid shot and MD updated.    Expected Discharge Plan: Skilled Nursing Facility Barriers to Discharge: SNF Pending bed offer, Insurance Authorization  Expected Discharge Plan and Services Expected Discharge Plan: [place] In-house Referral: Clinical Social Work Discharge Planning Services: CM Consult Post Acute Care Choice: [place]                                         Social Determinants of Health (SDOH) Interventions    Readmission Risk Interventions No flowsheet data found.

## 2020-11-20 NOTE — Progress Notes (Signed)
PROGRESS NOTE    Mark Clayton  [id number] DOB: January 30, 1941 DOA: 11/13/2020 PCP: [name], MD   Chief Complain: Generalized weakness, dysuria  Brief Narrative:  Patient is a 80 year old male with history of systolic congestive heart failure with ejection fraction less than 20%, status post AICD, stroke with right hemiparesis, hypertension, atrial fibrillation on Xarelto who presented with 3 days history of progressive weakness, confusion.  Stroke was suspected on admission and his UA was also suspicious for UTI.  Neurology was consulted.  Altered mental status was completed to be secondary to metabolic issues including UTI, stroke team signed off.  Urine culture showed Enterobacter cloacae, treating with antibiotics.  He was also started on IV Lasix for volume overload..  Palliative care consulted and  were following for goals of care.  Currently hemodynamically stable.  PT/OT recommending/facility.  Plan is for discharge to skilled nursing facility with palliative care follow-up.  Medically stable for discharge as soon as possible  Assessment & Plan:   Principal Problem:   Stroke-like symptoms Active Problems:   Chronic systolic heart failure (HCC)   Cardiac defibrillator  MDT VVI   Goals of care, counseling/discussion   Persistent atrial fibrillation   Essential hypertension   Dysarthria   Hemiplegia of right dominant side as late effect of cerebral infarction [facility])   Hallucination   Acute on chronic systolic congestive heart failure (HCC)   Pleural effusion due to CHF (congestive heart failure) (HCC)   Acute metabolic encephalopathy   Acute cystitis without hematuria   Acute metabolic encephalopathy: Suspected to be from UTI and other multifactorial causes.  Remains confused but may be back to baseline.  Has generalized deconditioning Stroke was suspected initially, stroke work-up negative.  Neurology was following earlier, now signed off.  Ammonia level normal.   Abnormal TFT, we recommend recheck in 3 to 4 weeks.  UTI: Complained of dysuria in presentation.  Urine culture showed Enterobacter cloacae, treated with  Cipro.  Acute on chronic congestive heart failure exacerbation: Last echo showed EF of less than 20%, has chronic bilateral pleural effusions.  History of coronary disease, status post CABG, status post AICD. IV Lasix was started initially, now changed to oral 60 mg twice a day.  Continue to monitor input/output, daily weight  Iron deficiency anemia: Iron studies showed low level of iron.  Given dose of IV iron.  He needs to continue oral iron supplementation on discharge.  Permanent A. fib: Currently rate is controlled with Coreg.  On Xarelto for anticoagulation.  GERD: Continue PPI  History of stroke/debility/deconditioning: Has right-sided residual weakness.  Continue statin, Coreg, Xarelto.  PT/OT recommend SNF on discharge  Goals of care: Elderly patient with multiple comorbidities.  Confused/has generalized decline.  Palliative care were  following for goals of care.  DNR.  Plan is for skilled nursing facility with palliative care/hospice services.           DVT prophylaxis: Xarelto Code Status: DnR Family Communication:Called daughter on phone on 8/23, connection not successful Status is: Inpatient  Remains inpatient appropriate because:Inpatient level of care appropriate due to severity of illness  Dispo: The patient is from: Home              Anticipated d/c is to: SNF              Patient currently is not medically stable to d/c.   Difficult to place patient No    Consultants: Neurology, palliative care   Procedures:None  Antimicrobials:  Anti-infectives (From admission, onward)    Start     Dose/Rate Route Frequency Ordered Stop   11/16/20 2000  ciprofloxacin (CIPRO) tablet 500 mg        500 mg Oral 2 times daily 11/16/20 1351 11/21/20 1959   11/14/20 2200  cefTRIAXone (ROCEPHIN) 1 g in sodium chloride 0.9  % 100 mL IVPB  Status:  Discontinued        1 g 200 mL/hr over 30 Minutes Intravenous Every 24 hours 11/13/20 2229 11/16/20 1351   11/13/20 2200  cefTRIAXone (ROCEPHIN) 1 g in sodium chloride 0.9 % 100 mL IVPB        1 g 200 mL/hr over 30 Minutes Intravenous  Once 11/13/20 2156 11/13/20 2253   11/13/20 2200  azithromycin (ZITHROMAX) 500 mg in sodium chloride 0.9 % 250 mL IVPB        500 mg 250 mL/hr over 60 Minutes Intravenous  Once 11/13/20 2156 11/13/20 2358       Subjective:  Patient seen and examined at the bedside this morning.  Hemodynamically stable without any complaints.  Medically stable for discharge as soon as bed is available at a skilled nursing facility  Objective: Vitals:   11/20/20 0005 11/20/20 0414 11/20/20 0910 11/20/20 1219  BP: 118/71 121/76 108/67 108/73  Pulse: 90 80 79 72  Resp: '18 18 16 16  '$ Temp: 97.9 F (36.6 C) 98.4 F (36.9 C)  98.8 F (37.1 C)  TempSrc: Oral Oral  Oral  SpO2: 100% 100% 100% 100%  Weight:      Height:        Intake/Output Summary (Last 24 hours) at 11/20/2020 1301 Last data filed at 11/19/2020 2000 Gross per 24 hour  Intake --  Output 300 ml  Net -300 ml   Filed Weights   11/13/20 1943  Weight: 61.7 kg    Examination:  General exam: Overall comfortable, not in distress, very deconditioned, chronically ill looking, appears malnourished HEENT: PERRL Respiratory system:  no wheezes or crackles  Cardiovascular system: S1 & S2 heard, RRR.  Gastrointestinal system: Abdomen is nondistended, soft and nontender. Central nervous system: Alert and oriented Extremities: No edema, no clubbing ,no cyanosis Skin: No rashes, no ulcers,no icterus         Data Reviewed: I have personally reviewed following labs and imaging studies  CBC: Recent Labs  Lab 11/13/20 2008 11/14/20 1520 11/16/20 0214 11/17/20 0744 11/18/20 0059 11/19/20 0847 11/20/20 0617  WBC 4.0   < > 4.6 5.8 5.4 6.3 5.5  NEUTROABS 2.7  --   --   --   --    --   --   HGB 8.5*   < > 8.9* 9.7* 9.2* 10.1* 10.0*  HCT 29.9*   < > 29.2* 34.1* 30.7* 33.5* 32.6*  MCV 91.7   < > 87.2 92.7 88.5 90.5 88.6  PLT 215   < > 190 248 220 231 241   < > = values in this interval not displayed.   Basic Metabolic Panel: Recent Labs  Lab 11/16/20 0214 11/17/20 0744 11/18/20 0059 11/19/20 0847 11/20/20 0353  NA 135 136 138 136 136  K 3.6 3.3* 4.4 4.6 5.0  CL 104 104 108 107 104  CO2 25 17* 22 19* 19*  GLUCOSE 120* 152* 86 134* 77  BUN 26* '21 20 18 18  '$ CREATININE 1.47* 1.43* 1.25* 1.30* 1.30*  CALCIUM 8.6* 8.9 9.1 9.0 9.1  MG 2.2 2.1 2.1 2.2 2.1   GFR: Estimated Creatinine  Clearance: 40.2 mL/min (A) (by C-G formula based on SCr of 1.3 mg/dL (H)). Liver Function Tests: Recent Labs  Lab 11/13/20 2008  AST 26  ALT 14  ALKPHOS 62  BILITOT 2.1*  PROT 5.1*  ALBUMIN 2.8*   No results for input(s): LIPASE, AMYLASE in the last 168 hours. Recent Labs  Lab 11/14/20 1520  AMMONIA 19   Coagulation Profile: No results for input(s): INR, PROTIME in the last 168 hours. Cardiac Enzymes: No results for input(s): CKTOTAL, CKMB, CKMBINDEX, TROPONINI in the last 168 hours. BNP (last 3 results) No results for input(s): PROBNP in the last 8760 hours. HbA1C: No results for input(s): HGBA1C in the last 72 hours. CBG: No results for input(s): GLUCAP in the last 168 hours. Lipid Profile: No results for input(s): CHOL, HDL, LDLCALC, TRIG, CHOLHDL, LDLDIRECT in the last 72 hours. Thyroid Function Tests: No results for input(s): TSH, T4TOTAL, FREET4, T3FREE, THYROIDAB in the last 72 hours. Anemia Panel: No results for input(s): VITAMINB12, FOLATE, FERRITIN, TIBC, IRON, RETICCTPCT in the last 72 hours. Sepsis Labs: No results for input(s): PROCALCITON, LATICACIDVEN in the last 168 hours.  Recent Results (from the past 240 hour(s))  Resp Panel by RT-PCR (Flu A&B, Covid) Nasopharyngeal Swab     Status: None   Collection Time: 11/13/20  8:30 PM   Specimen:  Nasopharyngeal Swab; Nasopharyngeal(NP) swabs in vial transport medium  Result Value Ref Range Status   SARS Coronavirus 2 by RT PCR NEGATIVE NEGATIVE Final    Comment: (NOTE) SARS-CoV-2 target nucleic acids are NOT DETECTED.  The SARS-CoV-2 RNA is generally detectable in upper respiratory specimens during the acute phase of infection. The lowest concentration of SARS-CoV-2 viral copies this assay can detect is 138 copies/mL. A negative result does not preclude SARS-Cov-2 infection and should not be used as the sole basis for treatment or other patient management decisions. A negative result may occur with  improper specimen collection/handling, submission of specimen other than nasopharyngeal swab, presence of viral mutation(s) within the areas targeted by this assay, and inadequate number of viral copies(<138 copies/mL). A negative result must be combined with clinical observations, patient history, and epidemiological information. The expected result is Negative.  Fact Sheet for Patients:  EntrepreneurPulse.com.au  Fact Sheet for Healthcare Providers:  IncredibleEmployment.be  This test is no t yet approved or cleared by the [place] FDA and  has been authorized for detection and/or diagnosis of SARS-CoV-2 by FDA under an Emergency Use Authorization (EUA). This EUA will remain  in effect (meaning this test can be used) for the duration of the COVID-19 declaration under Section 564(b)(1) of the Act, 21 U.S.C.section 360bbb-3(b)(1), unless the authorization is terminated  or revoked sooner.       Influenza A by PCR NEGATIVE NEGATIVE Final   Influenza B by PCR NEGATIVE NEGATIVE Final    Comment: (NOTE) The Xpert Xpress SARS-CoV-2/FLU/RSV plus assay is intended as an aid in the diagnosis of influenza from Nasopharyngeal swab specimens and should not be used as a sole basis for treatment. Nasal washings and aspirates are unacceptable for  Xpert Xpress SARS-CoV-2/FLU/RSV testing.  Fact Sheet for Patients: EntrepreneurPulse.com.au  Fact Sheet for Healthcare Providers: IncredibleEmployment.be  This test is not yet approved or cleared by the [place] FDA and has been authorized for detection and/or diagnosis of SARS-CoV-2 by FDA under an Emergency Use Authorization (EUA). This EUA will remain in effect (meaning this test can be used) for the duration of the COVID-19 declaration under Section 564(b)(1) of  the Act, 21 U.S.C. section 360bbb-3(b)(1), unless the authorization is terminated or revoked.  Performed at [facility], [place] [address] [place], [place] [address]   Urine Culture     Status: Abnormal   Collection Time: 11/13/20  8:53 PM   Specimen: Urine, Clean Catch  Result Value Ref Range Status   Specimen Description URINE, CLEAN CATCH  Final   Special Requests   Final    NONE Performed at [facility], [place] [address], [place], [place] [address]    Culture >=100,000 COLONIES/mL ENTEROBACTER CLOACAE (A)  Final   Report Status 11/16/2020 FINAL  Final   Organism ID, Bacteria ENTEROBACTER CLOACAE (A)  Final      Susceptibility   Enterobacter cloacae - MIC*    CEFAZOLIN >=64 RESISTANT Resistant     CEFEPIME <=0.12 SENSITIVE Sensitive     CIPROFLOXACIN <=0.25 SENSITIVE Sensitive     GENTAMICIN <=1 SENSITIVE Sensitive     IMIPENEM 0.5 SENSITIVE Sensitive     NITROFURANTOIN 32 SENSITIVE Sensitive     TRIMETH/SULFA <=20 SENSITIVE Sensitive     PIP/TAZO <=4 SENSITIVE Sensitive     * >=100,000 COLONIES/mL ENTEROBACTER CLOACAE  Blood culture (routine x 2)     Status: None   Collection Time: 11/13/20 10:11 PM   Specimen: BLOOD LEFT FOREARM  Result Value Ref Range Status   Specimen Description BLOOD LEFT FOREARM  Final   Special Requests   Final    BOTTLES DRAWN AEROBIC AND ANAEROBIC Blood Culture results may not be optimal due to an inadequate volume of blood  received in culture bottles   Culture   Final    NO GROWTH 5 DAYS Performed at [facility], [place] [address], [place], [place] [address]    Report Status 11/18/2020 FINAL  Final  Blood culture (routine x 2)     Status: None   Collection Time: 11/13/20 11:29 PM   Specimen: BLOOD RIGHT ARM  Result Value Ref Range Status   Specimen Description   Final    BLOOD RIGHT ARM Performed at [facility], [place] [place], [place] [address]    Special Requests   Final    BOTTLES DRAWN AEROBIC AND ANAEROBIC Blood Culture adequate volume Performed at [facility], [address] [place], [place] [address]    Culture   Final    NO GROWTH 5 DAYS Performed at [facility], [place] [address], [place], [place] [address]    Report Status 11/19/2020 FINAL  Final         Radiology Studies: No results found.      Scheduled Meds:   stroke: mapping our early stages of recovery book   Does not apply Once   atorvastatin  80 mg Oral Daily   carvedilol  6.25 mg Oral BID WC   ciprofloxacin  500 mg Oral BID   COVID-19 mRNA vaccine (Moderna)  0.25 mL Intramuscular Once   ferrous sulfate  325 mg Oral BID WC   furosemide  60 mg Oral BID   pantoprazole  40 mg Oral Daily   rivaroxaban  15 mg Oral Q supper   senna-docusate  1 tablet Oral QHS   vitamin B-12  100 mcg Oral Daily   Continuous Infusions:   LOS: 6 days    Time spent: 15 mins.More than 50% of that time was spent in counseling and/or coordination of care.      Shelly Coss,  MD [facility] P8/24/2022, 1:01 PM

## 2020-11-21 DIAGNOSIS — Z7901 Long term (current) use of anticoagulants: Secondary | ICD-10-CM | POA: Diagnosis not present

## 2020-11-21 DIAGNOSIS — R471 Dysarthria and anarthria: Secondary | ICD-10-CM | POA: Diagnosis not present

## 2020-11-21 DIAGNOSIS — I255 Ischemic cardiomyopathy: Secondary | ICD-10-CM | POA: Diagnosis not present

## 2020-11-21 DIAGNOSIS — E46 Unspecified protein-calorie malnutrition: Secondary | ICD-10-CM | POA: Diagnosis not present

## 2020-11-21 DIAGNOSIS — E876 Hypokalemia: Secondary | ICD-10-CM | POA: Diagnosis not present

## 2020-11-21 DIAGNOSIS — I5033 Acute on chronic diastolic (congestive) heart failure: Secondary | ICD-10-CM | POA: Diagnosis not present

## 2020-11-21 DIAGNOSIS — W19XXXA Unspecified fall, initial encounter: Secondary | ICD-10-CM | POA: Diagnosis not present

## 2020-11-21 DIAGNOSIS — D649 Anemia, unspecified: Secondary | ICD-10-CM | POA: Diagnosis not present

## 2020-11-21 DIAGNOSIS — M79661 Pain in right lower leg: Secondary | ICD-10-CM | POA: Diagnosis not present

## 2020-11-21 DIAGNOSIS — G9341 Metabolic encephalopathy: Secondary | ICD-10-CM | POA: Diagnosis not present

## 2020-11-21 DIAGNOSIS — Z7401 Bed confinement status: Secondary | ICD-10-CM | POA: Diagnosis not present

## 2020-11-21 DIAGNOSIS — K5901 Slow transit constipation: Secondary | ICD-10-CM | POA: Diagnosis not present

## 2020-11-21 DIAGNOSIS — M6281 Muscle weakness (generalized): Secondary | ICD-10-CM | POA: Diagnosis not present

## 2020-11-21 DIAGNOSIS — Z8673 Personal history of transient ischemic attack (TIA), and cerebral infarction without residual deficits: Secondary | ICD-10-CM | POA: Diagnosis not present

## 2020-11-21 DIAGNOSIS — I951 Orthostatic hypotension: Secondary | ICD-10-CM | POA: Diagnosis not present

## 2020-11-21 DIAGNOSIS — Z95810 Presence of automatic (implantable) cardiac defibrillator: Secondary | ICD-10-CM | POA: Diagnosis not present

## 2020-11-21 DIAGNOSIS — M25561 Pain in right knee: Secondary | ICD-10-CM | POA: Diagnosis not present

## 2020-11-21 DIAGNOSIS — R42 Dizziness and giddiness: Secondary | ICD-10-CM | POA: Diagnosis not present

## 2020-11-21 DIAGNOSIS — N390 Urinary tract infection, site not specified: Secondary | ICD-10-CM | POA: Diagnosis not present

## 2020-11-21 DIAGNOSIS — K219 Gastro-esophageal reflux disease without esophagitis: Secondary | ICD-10-CM | POA: Diagnosis not present

## 2020-11-21 DIAGNOSIS — N3000 Acute cystitis without hematuria: Secondary | ICD-10-CM | POA: Diagnosis not present

## 2020-11-21 DIAGNOSIS — I25119 Atherosclerotic heart disease of native coronary artery with unspecified angina pectoris: Secondary | ICD-10-CM | POA: Diagnosis not present

## 2020-11-21 DIAGNOSIS — I10 Essential (primary) hypertension: Secondary | ICD-10-CM | POA: Diagnosis not present

## 2020-11-21 DIAGNOSIS — D539 Nutritional anemia, unspecified: Secondary | ICD-10-CM | POA: Diagnosis not present

## 2020-11-21 DIAGNOSIS — E782 Mixed hyperlipidemia: Secondary | ICD-10-CM | POA: Diagnosis not present

## 2020-11-21 DIAGNOSIS — R531 Weakness: Secondary | ICD-10-CM | POA: Diagnosis not present

## 2020-11-21 DIAGNOSIS — R252 Cramp and spasm: Secondary | ICD-10-CM | POA: Diagnosis not present

## 2020-11-21 DIAGNOSIS — I2510 Atherosclerotic heart disease of native coronary artery without angina pectoris: Secondary | ICD-10-CM | POA: Diagnosis not present

## 2020-11-21 DIAGNOSIS — M79605 Pain in left leg: Secondary | ICD-10-CM | POA: Diagnosis not present

## 2020-11-21 DIAGNOSIS — R2689 Other abnormalities of gait and mobility: Secondary | ICD-10-CM | POA: Diagnosis not present

## 2020-11-21 DIAGNOSIS — R410 Disorientation, unspecified: Secondary | ICD-10-CM | POA: Diagnosis not present

## 2020-11-21 DIAGNOSIS — R404 Transient alteration of awareness: Secondary | ICD-10-CM | POA: Diagnosis not present

## 2020-11-21 DIAGNOSIS — R441 Visual hallucinations: Secondary | ICD-10-CM | POA: Diagnosis not present

## 2020-11-21 DIAGNOSIS — R2681 Unsteadiness on feet: Secondary | ICD-10-CM | POA: Diagnosis not present

## 2020-11-21 DIAGNOSIS — I4819 Other persistent atrial fibrillation: Secondary | ICD-10-CM | POA: Diagnosis not present

## 2020-11-21 DIAGNOSIS — I509 Heart failure, unspecified: Secondary | ICD-10-CM | POA: Diagnosis not present

## 2020-11-21 DIAGNOSIS — Z0189 Encounter for other specified special examinations: Secondary | ICD-10-CM | POA: Diagnosis not present

## 2020-11-21 DIAGNOSIS — Z950 Presence of cardiac pacemaker: Secondary | ICD-10-CM | POA: Diagnosis not present

## 2020-11-21 DIAGNOSIS — M6250 Muscle wasting and atrophy, not elsewhere classified, unspecified site: Secondary | ICD-10-CM | POA: Diagnosis not present

## 2020-11-21 DIAGNOSIS — R5383 Other fatigue: Secondary | ICD-10-CM | POA: Diagnosis not present

## 2020-11-21 DIAGNOSIS — I4891 Unspecified atrial fibrillation: Secondary | ICD-10-CM | POA: Diagnosis not present

## 2020-11-21 DIAGNOSIS — I679 Cerebrovascular disease, unspecified: Secondary | ICD-10-CM | POA: Diagnosis not present

## 2020-11-21 DIAGNOSIS — R5381 Other malaise: Secondary | ICD-10-CM | POA: Diagnosis not present

## 2020-11-21 DIAGNOSIS — R1312 Dysphagia, oropharyngeal phase: Secondary | ICD-10-CM | POA: Diagnosis not present

## 2020-11-21 DIAGNOSIS — I429 Cardiomyopathy, unspecified: Secondary | ICD-10-CM | POA: Diagnosis not present

## 2020-11-21 LAB — RESP PANEL BY RT-PCR (FLU A&B, COVID) ARPGX2
Influenza A by PCR: NEGATIVE
Influenza B by PCR: NEGATIVE
SARS Coronavirus 2 by RT PCR: NEGATIVE

## 2020-11-21 MED ORDER — RIVAROXABAN 15 MG PO TABS: 15.0000 mg | ORAL_TABLET | Freq: Every day | ORAL | Status: DC

## 2020-11-21 MED ORDER — FERROUS SULFATE 325 (65 FE) MG PO TABS: 325.0000 mg | ORAL_TABLET | Freq: Two times a day (BID) | ORAL | 3 refills | Status: AC

## 2020-11-21 MED ORDER — CARVEDILOL 3.125 MG PO TABS
3.1250 mg | ORAL_TABLET | Freq: Two times a day (BID) | ORAL | Status: DC
Start: 2020-11-21 — End: 2021-02-25

## 2020-11-21 NOTE — Progress Notes (Signed)
  Speech Language Pathology Treatment: Cognitive-Linquistic  Patient Details Name: LEDARRIUS BEAUCHAINE MRN: [id number] DOB: 1940-07-25 Today's Date: 11/21/2020 Time: 1210-1223 SLP Time Calculation (min) (ACUTE ONLY): 13 min  Assessment / Plan / Recommendation Clinical Impression  Pt's communication much improved since time of initial assessment.  His aphasia remains present but appears to be at baseline. Demonstrates fewer paraphasias, improved recognition of paraphasias when they do occur, better self-correction. Min assist overall to specify needs and improve precision. Pt may benefit from SLP for aphasia once at SNF.    HPI HPI: 80 y.o. 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 Neuro is following; their suspicion for him having a new stroke is low, however planning to repeat CT.  Potentially his symptoms are recrudescence in the setting of a UTI.  MRI not possible.      SLP Plan  All goals met       Recommendations                   Oral Care Recommendations: Oral care BID Follow up Recommendations: Skilled Nursing facility SLP Visit Diagnosis: Aphasia (R47.01) Plan: All goals met       GO              Candice Tobey L. Tivis Ringer, [place] CCC/SLP Acute Rehabilitation Services Office number [phone] Pager [phone]   Assunta Curtis 11/21/2020, 12:28 PM

## 2020-11-21 NOTE — Progress Notes (Signed)
Manufacturing engineer [facility])  Hospital Liaison RN note         Notified by [facility] manager of patient/family request for [facility] Palliative services at [facility] after discharge.              [place] Palliative team will follow up with patient after discharge.         Please call with any hospice or palliative related questions.         Thank you for the opportunity to participate in this patient's care.     Domenic Moras, BSN, RN [facility] Liaison (listed on [place] under Hospice[name])    [phone] (24h on call)

## 2020-11-21 NOTE — TOC Transition Note (Signed)
Transition of Care [facility]) - CM/SW Discharge Note   Patient Details  Name: Mark Clayton MRN: [id number] Date of Birth: 05-29-1940  Transition of Care [facility]) CM/SW Contact:  Pollie Friar, RN Phone Number: 11/21/2020, 11:43 AM   Clinical Narrative:    Patient discharging to [facility] today. Pt will transport via [facility]. D/c packet at the desk and bedside RN updated.   Room: 702P Number for report: [phone]    Final next level of care: Skilled Nursing Facility Barriers to Discharge: No Barriers Identified   Patient Goals and CMS Choice   CMS Medicare.gov Compare Post Acute Care list provided to:: Patient Represenative (must comment) Choice offered to / list presented to : Adult Children  Discharge Placement PASRR number recieved: 11/19/20            Patient chooses bed at: [facility] Patient to be transferred to facility by: [place] Name of family member notified: Melissa--daughter Patient and family notified of of transfer: 11/21/20  Discharge Plan and Services In-house Referral: Clinical Social Work Discharge Planning Services: [employer] Consult Post Acute Care Choice: [place]                               Social Determinants of Health (SDOH) Interventions     Readmission Risk Interventions No flowsheet data found.

## 2020-11-21 NOTE — Progress Notes (Signed)
Occupational Therapy Treatment Patient Details Name: Mark Clayton MRN: [id number] DOB: 01-Jul-1940 Today's Date: 11/21/2020    History of present illness 80 y/o male presented to ED on 8/17 with complaints of generalized weakness since 8/16. Suggesting UTI and possible pneumonia/CHF. PMH: CVA with R hemi in 2021, AICD placement, CHF with EF 20-25%, HTN   OT comments  Patient with good progress toward patient focused goals.  Improved participation and ability to move.  Needing up to Mod a for supine to sit, good edge of bed sit balance.  Patient able to complete seated grooming and UB bathing with setup and supervision.  Min a for upper body dressing, and Max A for lower body dressing.  Patient able to stand and transfer to the recliner with increased time and up to [place] guard.  Patient is scheduled for discharge to SNF this date, but OT will continue to follow in the acute setting   Follow Up Recommendations  SNF;Supervision/Assistance - 24 hour    Equipment Recommendations  None recommended by OT    Recommendations for Other Services      Precautions / Restrictions Precautions Precautions: Fall Restrictions Weight Bearing Restrictions: No       Mobility Bed Mobility Overal bed mobility: Needs Assistance Bed Mobility: Supine to Sit     Supine to sit: Mod assist     General bed mobility comments: Patient able to advance B [name] off bed without assistance. ModA for trunk elevation with use of bed rail also Patient Response: Anxious  Transfers Overall transfer level: Needs assistance Equipment used: Rolling walker (2 wheeled) Transfers: Sit to/from [facility] Sit to Stand: Min guard Stand pivot transfers: Min guard       General transfer comment: very close supervision due to patient refusing therapist assistance. Increased time to perform but able to complete without physical assistance. Assist to brace RW as patient pulling on RW to assist with standing.  Cues provided for hand placement but patient not receptive. Min guard to stand pivot towards R to recliner. No physical assistance required    Balance Overall balance assessment: Needs assistance Sitting-balance support: Feet supported Sitting balance-Leahy Scale: Good     Standing balance support: Bilateral upper extremity supported Standing balance-Leahy Scale: Poor Standing balance comment: reliant on UE support                           ADL either performed or assessed with clinical judgement   ADL Overall ADL's : Needs assistance/impaired     Grooming: Set up;Wash/dry face;Sitting   Upper Body Bathing: Supervision/ safety;Sitting;Cueing for sequencing       Upper Body Dressing : Minimal assistance;Sitting   Lower Body Dressing: Sit to/from stand;Maximal assistance               Functional mobility during ADLs: Minimal assistance;Rolling walker       Vision Ability to See in Adequate Light: 0 Adequate Patient Visual Report: No change from baseline     Perception     Praxis      Cognition Arousal/Alertness: Awake/alert Behavior During Therapy: Anxious Overall Cognitive Status: History of cognitive impairments - at baseline                                 General Comments: Patient wanting to be independent with mobility and requesting for therapist to back away from him. Therapist  within close proximity for safety and education provided. Intermittent confusion noted throughout session. Pleasantly confused but increased agitation when asked to perform task or assisting when patient doesn't want assistance.                          Pertinent Vitals/ Pain       Pain Assessment: No/denies pain Pain Intervention(s): Monitored during session                                                          Frequency  Min 2X/week        Progress Toward Goals  OT Goals(current goals can now be found in  the care plan section)  Progress towards OT goals: Progressing toward goals  Acute Rehab OT Goals Patient Stated Goal: hope to go back home OT Goal Formulation: With patient Time For Goal Achievement: 11/28/20 Potential to Achieve Goals: [place] Discharge plan remains appropriate    Co-evaluation    PT/OT/SLP Co-Evaluation/Treatment: Yes Reason for Co-Treatment: Complexity of the patient's impairments (multi-system involvement);Necessary to address cognition/behavior during functional activity;For patient/therapist safety;To address functional/ADL transfers   OT goals addressed during session: ADL's and self-care      AM-PAC OT "6 Clicks" Daily Activity     Outcome Measure   Help from another person eating meals?: None Help from another person taking care of personal grooming?: A Little Help from another person toileting, which includes using toliet, bedpan, or urinal?: A Lot Help from another person bathing (including washing, rinsing, drying)?: A Lot Help from another person to put on and taking off regular upper body clothing?: A Little Help from another person to put on and taking off regular lower body clothing?: A Lot 6 Click Score: 16    End of Session Equipment Utilized During Treatment: Gait belt;Rolling walker  OT Visit Diagnosis: Unsteadiness on feet (R26.81);Other abnormalities of gait and mobility (R26.89);Muscle weakness (generalized) (M62.81);Other symptoms and signs involving cognitive function;Hemiplegia and hemiparesis Hemiplegia - Right/Left: Right Hemiplegia - dominant/non-dominant: Dominant   Activity Tolerance Patient tolerated treatment well   Patient Left in chair;with call bell/phone within reach;with chair alarm set   Nurse Communication Mobility status        Time: [id number] OT Time Calculation (min): 24 min  Charges: OT General Charges $OT Visit: 1 Visit OT Treatments $Self Care/Home Management : 8-22 mins  11/21/2020  RP,  OTR/L  Acute Rehabilitation Services  Office:  [phone]    Metta Clines 11/21/2020, 12:41 PM

## 2020-11-21 NOTE — Progress Notes (Signed)
Order to discharge pt to [facility].  Discharge instructions/AVS given to PTAR and facility.  Attempted to call report to [facility].

## 2020-11-21 NOTE — Discharge Summary (Addendum)
Physician Discharge Summary  Mark Clayton [id number] DOB: [birth year] DOA: 11/13/2020  PCP: [name], MD  Admit date: 11/13/2020 Discharge date: 11/21/2020  Admitted From: Home Disposition:  Home  Discharge Condition:Stable CODE STATUS:DNR Diet recommendation: Regular   Brief/Interim Summary:  Patient is a 80 year old male with history of systolic congestive heart failure with ejection fraction less than 20%, status post AICD, stroke with right hemiparesis, hypertension, atrial fibrillation on Xarelto who presented with 3 days history of progressive weakness, confusion.  Stroke was suspected on admission and his UA was also suspicious for UTI.  Neurology was consulted.  Altered mental status was completed to be secondary to metabolic issues including UTI, stroke team signed off.  Urine culture showed Enterobacter cloacae, treated with antibiotics.  He was also started on IV Lasix for volume overload..  Palliative care consulted and  were following for goals of care.  Currently hemodynamically stable.  PT/OT recommending/facility.  Plan is for discharge to skilled nursing facility with palliative care follow-up.    Following problems were addressed during his hospitalization:   Acute metabolic encephalopathy: Suspected to be from UTI and other multifactorial causes.  Remains confused but may be back to baseline.  Has generalized deconditioning Stroke was suspected initially, stroke work-up negative.  Neurology was following earlier, now signed off.  Ammonia level normal.  Abnormal TFT, we recommend recheck in 3 to 4 weeks.   UTI: Complained of dysuria in presentation.  Urine culture showed Enterobacter cloacae, treated with  Cipro.   Acute on chronic congestive heart failure exacerbation: Last echo showed EF of less than 20%, has chronic bilateral pleural effusions.  History of coronary disease, status post CABG, status post AICD. IV Lasix was started initially, now changed  to oral 60 mg twice a day.    Iron deficiency anemia: Iron studies showed low level of iron.  Given dose of IV iron.  He needs to continue oral iron supplementation on discharge.   Permanent A. fib: Currently rate is controlled with Coreg.  On Xarelto for anticoagulation.   GERD: Continue PPI   History of stroke/debility/deconditioning: Has right-sided residual weakness.  Continue statin, Coreg, Xarelto.  PT/OT recommend SNF on discharge   Goals of care: Elderly patient with multiple comorbidities.  Confused/has generalized decline.  Palliative care were  following for goals of care.  DNR.  Plan is for skilled nursing facility with palliative care/hospice services.    Discharge Diagnoses:  Principal Problem:   Stroke-like symptoms Active Problems:   Chronic systolic heart failure (HCC)   Cardiac defibrillator  MDT VVI   Goals of care, counseling/discussion   Persistent atrial fibrillation   Essential hypertension   Dysarthria   Hemiplegia of right dominant side as late effect of cerebral infarction [facility])   Hallucination   Acute on chronic systolic congestive heart failure (HCC)   Pleural effusion due to CHF (congestive heart failure) ([place])   Acute metabolic encephalopathy   Acute cystitis without hematuria    Discharge Instructions  Discharge Instructions     Diet general   Complete by: As directed    Discharge instructions   Complete by: As directed    1)Please take your medications as instructed   Increase activity slowly   Complete by: As directed       Allergies as of 11/21/2020       Reactions   Buspar [buspirone] Other (See Comments)   Fatigue, body aches and shortness of breath    Latex Rash   Tape  Rash, Other (See Comments)   USE PAPER        Medication List     STOP taking these medications    clopidogrel 75 MG tablet Commonly known as: PLAVIX   isosorbide mononitrate 30 MG 24 hr tablet Commonly known as: IMDUR   losartan 50 MG  tablet Commonly known as: COZAAR       TAKE these medications    atorvastatin 80 MG tablet Commonly known as: LIPITOR TAKE 1 TABLET BY MOUTH  DAILY   baclofen 10 MG tablet Commonly known as: LIORESAL Take 1 tablet (10 mg total) by mouth 2 (two) times daily as needed for muscle spasms.   carvedilol 3.125 MG tablet Commonly known as: COREG Take 1 tablet (3.125 mg total) by mouth 2 (two) times daily with a meal. What changed:  medication strength how much to take   ferrous sulfate 325 (65 FE) MG tablet Take 1 tablet (325 mg total) by mouth 2 (two) times daily with a meal.   furosemide 40 MG tablet Commonly known as: Lasix Take 1.5 tablets (60 mg total) by mouth 2 (two) times daily. What changed:  how much to take when to take this   meclizine 25 MG tablet Commonly known as: ANTIVERT Take 1 tablet (25 mg total) by mouth daily as needed for dizziness.   nitroGLYCERIN 0.4 MG SL tablet Commonly known as: NITROSTAT DISSOLVE 1 TABLET UNDER THE TONGUE EVERY 5 MINUTES AS  NEEDED FOR CHEST PAIN. MAX  OF 3 TABLETS IN 15 MINUTES. CALL 911 IF PAIN PERSISTS. What changed: See the new instructions.   pantoprazole 40 MG tablet Commonly known as: PROTONIX TAKE 1 TABLET BY MOUTH  DAILY   polyethylene glycol 17 g packet Commonly known as: MIRALAX / GLYCOLAX Take 17 g by mouth daily as needed for mild constipation.   potassium chloride 10 MEQ tablet Commonly known as: KLOR-CON Take 20 mEq by mouth daily.   Rivaroxaban 15 MG Tabs tablet Commonly known as: XARELTO Take 1 tablet (15 mg total) by mouth daily with supper. What changed:  medication strength how much to take   vitamin B-12 100 MCG tablet Commonly known as: CYANOCOBALAMIN Take 100 mcg by mouth daily.        Allergies  Allergen Reactions   Buspar [Buspirone] Other (See Comments)    Fatigue, body aches and shortness of breath    Latex Rash   Tape Rash and Other (See Comments)    USE PAPER     Consultations: Palliative care   Procedures/Studies: DG Chest 2 View  Result Date: 11/13/2020 CLINICAL DATA:  Weakness EXAM: CHEST - 2 VIEW COMPARISON:  09/26/2020 FINDINGS: Post sternotomy changes. Left-sided pacing device as before. Mild cardiomegaly with small moderate left pleural effusion and mild airspace disease. Aortic atherosclerosis. No pneumothorax. IMPRESSION: Small moderate left pleural effusion with airspace disease at left base. Cardiomegaly Electronically Signed   By: Donavan Foil M.D.   On: 11/13/2020 20:26   CT HEAD WO CONTRAST (5MM)  Result Date: 11/13/2020 CLINICAL DATA:  Generalized weakness for 2 days EXAM: CT HEAD WITHOUT CONTRAST TECHNIQUE: Contiguous axial images were obtained from the base of the skull through the vertex without intravenous contrast. COMPARISON:  10/29/2020 FINDINGS: Brain: Mild chronic atrophic changes are noted. There are encephalomalacia changes identified in the left frontal parietal region consistent with prior infarct. Mild ex vacuo dilatation of the left lateral ventricle is seen. No acute hemorrhage, acute infarction or space-occupying mass lesion is noted. Prominent cisterna magna  is noted posteriorly in the posterior fossa. Vascular: No hyperdense vessel or unexpected calcification. Skull: Normal. Negative for fracture or focal lesion. Sinuses/Orbits: No acute finding. Other: None. IMPRESSION: Stable left-sided frontal parietal infarct similar to that noted on the prior exam. Chronic atrophic changes without acute abnormality. Electronically Signed   By: Inez Catalina M.D.   On: 11/13/2020 21:09   CT Head Wo Contrast  Result Date: 10/29/2020 CLINICAL DATA:  Neuro deficit, stroke suspected EXAM: CT HEAD WITHOUT CONTRAST TECHNIQUE: Contiguous axial images were obtained from the base of the skull through the vertex without intravenous contrast. COMPARISON:  06/27/2019 FINDINGS: Brain: No evidence of acute infarction, hemorrhage, hydrocephalus,  extra-axial collection or mass lesion/mass effect. Left frontal encephalomalacia, in keeping with expected evolution of previously seen acute to subacute infarction on examination dated 06/27/2019. Mild periventricular white matter hypodensity. Incidental note of mega cisterna magna variant of the posterior fossa. Vascular: No hyperdense vessel or unexpected calcification. Skull: Normal. Negative for fracture or focal lesion. Sinuses/Orbits: No acute finding. Other: None. IMPRESSION: 1. No acute intracranial pathology. 2. Left frontal encephalomalacia, in keeping with expected evolution of previously seen acute to subacute infarction on examination dated 06/27/2019. 3. Small-vessel white matter disease. Electronically Signed   By: Eddie Candle M.D.   On: 10/29/2020 14:26   EEG adult  Result Date: 10/24/2020 Cameron Sprang, MD     11/04/2020 11:37 AM ELECTROENCEPHALOGRAM REPORT Date of Study: 10/24/2020 Patient's Name: VUE BEYLER MRN: [id number] Date of Birth: March 24, 1941 Referring Provider: Dr. Ellouise Newer Clinical History: This is a 80 year old man with a history of left MCA stroke with recurrent episodes of staring/unresponsiveness and visual hallucinations. Medications: Xanax Lipitor Coreg Plavix Zetia Lasix Imdur Remeron Xarelto Technical Summary: A multichannel digital EEG recording measured by the international 10-20 system with electrodes applied with paste and impedances below 5000 ohms performed as portable with EKG monitoring in an awake patient.  Hyperventilation was not performed. Photic stimulation was performed.  The digital EEG was referentially recorded, reformatted, and digitally filtered in a variety of bipolar and referential montages for optimal display. Description: The patient is awake during the recording.  During maximal wakefulness, there is a symmetric, medium voltage 6 Hz posterior dominant rhythm that attenuates with eye opening. There is occasional focal 4-5 Hz theta slowing over the  left hemisphere. Sleep was not captured. Photic stimulation did not elicit any abnormalities.  There were no epileptiform discharges or electrographic seizures seen.  EKG lead showed irregular rhythm. Impression: This awake EEG is abnormal due to the presence of: Slowing of the posterior dominant rhythm Occasional focal slowing over the left hemisphere Clinical Correlation of the above findings indicates diffuse cerebral dysfunction that is non-specific in etiology and can be seen with hypoxic/ischemic injury, toxic/metabolic encephalopathies, neurodegenerative disorders, or medication effect. Focal slowing over the left hemisphere indicates focal cerebral dysfunction suggestive of underlying structural or physiologic abnormality. The absence of epileptiform discharges does not rule out a clinical diagnosis of epilepsy. If further clinical questions remain, prolonged EEG may be helpful. Clinical correlation is advised. Ellouise Newer, M.D.   CUP PACEART REMOTE DEVICE CHECK  Result Date: 10/23/2020 Scheduled remote reviewed. Normal device function.  100% AF, +OAC.  Optivol elevated 4/22, ongoing, HF risk "high". Next remote 91 days. LR     Subjective: Patient seen and examined at the bedside this morning.  Hemodynamically stable.  Discussed discharge planning with the daughter on phone.  Discharge Exam: Vitals:   11/21/20 0815 11/21/20 1122  BP: (!) 101/50 (!) 96/54  Pulse: 69 68  Resp: 18 16  Temp: (!) 97.3 F (36.3 C) (!) 97.4 F (36.3 C)  SpO2: 100% 100%   Vitals:   11/20/20 2340 11/21/20 0410 11/21/20 0815 11/21/20 1122  BP: (!) 94/58 101/71 (!) 101/50 (!) 96/54  Pulse: 72 78 69 68  Resp: '17 18 18 16  '$ Temp: 98.7 F (37.1 C) 98.7 F (37.1 C) (!) 97.3 F (36.3 C) (!) 97.4 F (36.3 C)  TempSrc:   Oral Oral  SpO2: 100% 99% 100% 100%  Weight:      Height:        General: Pt is alert, awake, not in acute distress Cardiovascular: RRR, S1/S2 +, no rubs, no gallops Respiratory: CTA  bilaterally, no wheezing, no rhonchi Abdominal: Soft, NT, ND, bowel sounds + Extremities: no edema, no cyanosis    The results of significant diagnostics from this hospitalization (including imaging, microbiology, ancillary and laboratory) are listed below for reference.     Microbiology: Recent Results (from the past 240 hour(s))  Resp Panel by RT-PCR (Flu A&B, Covid) Nasopharyngeal Swab     Status: None   Collection Time: 11/13/20  8:30 PM   Specimen: Nasopharyngeal Swab; Nasopharyngeal(NP) swabs in vial transport medium  Result Value Ref Range Status   SARS Coronavirus 2 by RT PCR NEGATIVE NEGATIVE Final    Comment: (NOTE) SARS-CoV-2 target nucleic acids are NOT DETECTED.  The SARS-CoV-2 RNA is generally detectable in upper respiratory specimens during the acute phase of infection. The lowest concentration of SARS-CoV-2 viral copies this assay can detect is 138 copies/mL. A negative result does not preclude SARS-Cov-2 infection and should not be used as the sole basis for treatment or other patient management decisions. A negative result may occur with  improper specimen collection/handling, submission of specimen other than nasopharyngeal swab, presence of viral mutation(s) within the areas targeted by this assay, and inadequate number of viral copies(<138 copies/mL). A negative result must be combined with clinical observations, patient history, and epidemiological information. The expected result is Negative.  Fact Sheet for Patients:  EntrepreneurPulse.com.au  Fact Sheet for Healthcare Providers:  IncredibleEmployment.be  This test is no t yet approved or cleared by the [place] FDA and  has been authorized for detection and/or diagnosis of SARS-CoV-2 by FDA under an Emergency Use Authorization (EUA). This EUA will remain  in effect (meaning this test can be used) for the duration of the COVID-19 declaration under Section  564(b)(1) of the Act, 21 U.S.C.section 360bbb-3(b)(1), unless the authorization is terminated  or revoked sooner.       Influenza A by PCR NEGATIVE NEGATIVE Final   Influenza B by PCR NEGATIVE NEGATIVE Final    Comment: (NOTE) The Xpert Xpress SARS-CoV-2/FLU/RSV plus assay is intended as an aid in the diagnosis of influenza from Nasopharyngeal swab specimens and should not be used as a sole basis for treatment. Nasal washings and aspirates are unacceptable for Xpert Xpress SARS-CoV-2/FLU/RSV testing.  Fact Sheet for Patients: EntrepreneurPulse.com.au  Fact Sheet for Healthcare Providers: IncredibleEmployment.be  This test is not yet approved or cleared by the [place] FDA and has been authorized for detection and/or diagnosis of SARS-CoV-2 by FDA under an Emergency Use Authorization (EUA). This EUA will remain in effect (meaning this test can be used) for the duration of the COVID-19 declaration under Section 564(b)(1) of the Act, 21 U.S.C. section 360bbb-3(b)(1), unless the authorization is terminated or revoked.  Performed at [name], [address] [place], [place] [address]   Urine Culture     Status: Abnormal   Collection Time: 11/13/20  8:53 PM   Specimen: Urine, Clean Catch  Result Value Ref Range Status   Specimen Description URINE, CLEAN CATCH  Final   Special Requests   Final    NONE Performed at [facility], [place] [address] [place], [place] [address]    Culture >=100,000 COLONIES/mL ENTEROBACTER CLOACAE (A)  Final   Report Status 11/16/2020 FINAL  Final   Organism ID, Bacteria ENTEROBACTER CLOACAE (A)  Final      Susceptibility   Enterobacter cloacae - MIC*    CEFAZOLIN >=64 RESISTANT Resistant     CEFEPIME <=0.12 SENSITIVE Sensitive     CIPROFLOXACIN <=0.25 SENSITIVE Sensitive     GENTAMICIN <=1 SENSITIVE Sensitive     IMIPENEM 0.5 SENSITIVE Sensitive     NITROFURANTOIN 32 SENSITIVE Sensitive      TRIMETH/SULFA <=20 SENSITIVE Sensitive     PIP/TAZO <=4 SENSITIVE Sensitive     * >=100,000 COLONIES/mL ENTEROBACTER CLOACAE  Blood culture (routine x 2)     Status: None   Collection Time: 11/13/20 10:11 PM   Specimen: BLOOD LEFT FOREARM  Result Value Ref Range Status   Specimen Description BLOOD LEFT FOREARM  Final   Special Requests   Final    BOTTLES DRAWN AEROBIC AND ANAEROBIC Blood Culture results may not be optimal due to an inadequate volume of blood received in culture bottles   Culture   Final    NO GROWTH 5 DAYS Performed at [facility], [place] [address] [place], [place] [address]    Report Status 11/18/2020 FINAL  Final  Blood culture (routine x 2)     Status: None   Collection Time: 11/13/20 11:29 PM   Specimen: BLOOD RIGHT ARM  Result Value Ref Range Status   Specimen Description   Final    BLOOD RIGHT ARM Performed at [facility], [address] [place], [place] [address]    Special Requests   Final    BOTTLES DRAWN AEROBIC AND ANAEROBIC Blood Culture adequate volume Performed at [facility], [address] [place], [place] [address]    Culture   Final    NO GROWTH 5 DAYS Performed at [facility], [place] [address], [place], [place] [address]    Report Status 11/19/2020 FINAL  Final  Resp Panel by RT-PCR (Flu A&B, Covid) Nasopharyngeal Swab     Status: None   Collection Time: 11/21/20  8:22 AM   Specimen: Nasopharyngeal Swab; Nasopharyngeal(NP) swabs in vial transport medium  Result Value Ref Range Status   SARS Coronavirus 2 by RT PCR NEGATIVE NEGATIVE Final    Comment: (NOTE) SARS-CoV-2 target nucleic acids are NOT DETECTED.  The SARS-CoV-2 RNA is generally detectable in upper respiratory specimens during the acute phase of infection. The lowest concentration of SARS-CoV-2 viral copies this assay can detect is 138 copies/mL. A negative result does not preclude SARS-Cov-2 infection and should not be used as the sole  basis for treatment or other patient management decisions. A negative result may occur with  improper specimen collection/handling, submission of specimen other than nasopharyngeal swab, presence of viral mutation(s) within the areas targeted by this assay, and inadequate number of viral copies(<138 copies/mL). A negative result must be combined with clinical observations, patient history, and epidemiological information. The expected result is Negative.  Fact Sheet for Patients:  EntrepreneurPulse.com.au  Fact Sheet for Healthcare Providers:  IncredibleEmployment.be  This test is no t yet approved or cleared by the [place] FDA and  has been authorized for detection and/or diagnosis of SARS-CoV-2 by FDA under an Emergency Use Authorization (EUA). This EUA will remain  in effect (meaning this test can be used) for the duration of the COVID-19 declaration under Section 564(b)(1) of the Act, 21 U.S.C.section 360bbb-3(b)(1), unless the authorization is terminated  or revoked sooner.       Influenza A by PCR NEGATIVE NEGATIVE Final   Influenza B by PCR NEGATIVE NEGATIVE Final    Comment: (NOTE) The Xpert Xpress SARS-CoV-2/FLU/RSV plus assay is intended as an aid in the diagnosis of influenza from Nasopharyngeal swab specimens and should not be used as a sole basis for treatment. Nasal washings and aspirates are unacceptable for Xpert Xpress SARS-CoV-2/FLU/RSV testing.  Fact Sheet for Patients: EntrepreneurPulse.com.au  Fact Sheet for Healthcare Providers: IncredibleEmployment.be  This test is not yet approved or cleared by the [place] FDA and has been authorized for detection and/or diagnosis of SARS-CoV-2 by FDA under an Emergency Use Authorization (EUA). This EUA will remain in effect (meaning this test can be used) for the duration of the COVID-19 declaration under Section 564(b)(1) of the Act,  21 U.S.C. section 360bbb-3(b)(1), unless the authorization is terminated or revoked.  Performed at [facility], [place] [address], [place], [place] [address]      Labs: BNP (last 3 results) Recent Labs    11/14/20 1520  BNP [id number]*   Basic Metabolic Panel: Recent Labs  Lab 11/16/20 0214 11/17/20 0744 11/18/20 0059 11/19/20 0847 11/20/20 0353  NA 135 136 138 136 136  K 3.6 3.3* 4.4 4.6 5.0  CL 104 104 108 107 104  CO2 25 17* 22 19* 19*  GLUCOSE 120* 152* 86 134* 77  BUN 26* '21 20 18 18  '$ CREATININE 1.47* 1.43* 1.25* 1.30* 1.30*  CALCIUM 8.6* 8.9 9.1 9.0 9.1  MG 2.2 2.1 2.1 2.2 2.1   Liver Function Tests: No results for input(s): AST, ALT, ALKPHOS, BILITOT, PROT, ALBUMIN in the last 168 hours. No results for input(s): LIPASE, AMYLASE in the last 168 hours. Recent Labs  Lab 11/14/20 1520  AMMONIA 19   CBC: Recent Labs  Lab 11/16/20 0214 11/17/20 0744 11/18/20 0059 11/19/20 0847 11/20/20 0617  WBC 4.6 5.8 5.4 6.3 5.5  HGB 8.9* 9.7* 9.2* 10.1* 10.0*  HCT 29.2* 34.1* 30.7* 33.5* 32.6*  MCV 87.2 92.7 88.5 90.5 88.6  PLT 190 248 220 231 241   Cardiac Enzymes: No results for input(s): CKTOTAL, CKMB, CKMBINDEX, TROPONINI in the last 168 hours. BNP: Invalid input(s): POCBNP CBG: No results for input(s): GLUCAP in the last 168 hours. D-Dimer No results for input(s): DDIMER in the last 72 hours. Hgb A1c No results for input(s): HGBA1C in the last 72 hours. Lipid Profile No results for input(s): CHOL, HDL, LDLCALC, TRIG, CHOLHDL, LDLDIRECT in the last 72 hours. Thyroid function studies No results for input(s): TSH, T4TOTAL, T3FREE, THYROIDAB in the last 72 hours.  Invalid input(s): FREET3 Anemia work up No results for input(s): VITAMINB12, FOLATE, FERRITIN, TIBC, IRON, RETICCTPCT in the last 72 hours. Urinalysis    Component Value Date/Time   COLORURINE YELLOW 11/13/2020 2027   APPEARANCEUR CLEAR 11/13/2020 2027   LABSPEC 1.010 11/13/2020 2027    PHURINE 5.0 11/13/2020 2027   GLUCOSEU NEGATIVE 11/13/2020 2027   HGBUR SMALL (A) 11/13/2020 2027   BILIRUBINUR NEGATIVE 11/13/2020 2027  KETONESUR NEGATIVE 11/13/2020 2027   PROTEINUR NEGATIVE 11/13/2020 2027   UROBILINOGEN 1.0 12/11/2008 1150   NITRITE POSITIVE (A) 11/13/2020 2027   LEUKOCYTESUR SMALL (A) 11/13/2020 2027   Sepsis Labs Invalid input(s): PROCALCITONIN,  WBC,  LACTICIDVEN Microbiology Recent Results (from the past 240 hour(s))  Resp Panel by RT-PCR (Flu A&B, Covid) Nasopharyngeal Swab     Status: None   Collection Time: 11/13/20  8:30 PM   Specimen: Nasopharyngeal Swab; Nasopharyngeal(NP) swabs in vial transport medium  Result Value Ref Range Status   SARS Coronavirus 2 by RT PCR NEGATIVE NEGATIVE Final    Comment: (NOTE) SARS-CoV-2 target nucleic acids are NOT DETECTED.  The SARS-CoV-2 RNA is generally detectable in upper respiratory specimens during the acute phase of infection. The lowest concentration of SARS-CoV-2 viral copies this assay can detect is 138 copies/mL. A negative result does not preclude SARS-Cov-2 infection and should not be used as the sole basis for treatment or other patient management decisions. A negative result may occur with  improper specimen collection/handling, submission of specimen other than nasopharyngeal swab, presence of viral mutation(s) within the areas targeted by this assay, and inadequate number of viral copies(<138 copies/mL). A negative result must be combined with clinical observations, patient history, and epidemiological information. The expected result is Negative.  Fact Sheet for Patients:  EntrepreneurPulse.com.au  Fact Sheet for Healthcare Providers:  IncredibleEmployment.be  This test is no t yet approved or cleared by the [place] FDA and  has been authorized for detection and/or diagnosis of SARS-CoV-2 by FDA under an Emergency Use Authorization (EUA). This EUA will  remain  in effect (meaning this test can be used) for the duration of the COVID-19 declaration under Section 564(b)(1) of the Act, 21 U.S.C.section 360bbb-3(b)(1), unless the authorization is terminated  or revoked sooner.       Influenza A by PCR NEGATIVE NEGATIVE Final   Influenza B by PCR NEGATIVE NEGATIVE Final    Comment: (NOTE) The Xpert Xpress SARS-CoV-2/FLU/RSV plus assay is intended as an aid in the diagnosis of influenza from Nasopharyngeal swab specimens and should not be used as a sole basis for treatment. Nasal washings and aspirates are unacceptable for Xpert Xpress SARS-CoV-2/FLU/RSV testing.  Fact Sheet for Patients: EntrepreneurPulse.com.au  Fact Sheet for Healthcare Providers: IncredibleEmployment.be  This test is not yet approved or cleared by the [place] FDA and has been authorized for detection and/or diagnosis of SARS-CoV-2 by FDA under an Emergency Use Authorization (EUA). This EUA will remain in effect (meaning this test can be used) for the duration of the COVID-19 declaration under Section 564(b)(1) of the Act, 21 U.S.C. section 360bbb-3(b)(1), unless the authorization is terminated or revoked.  Performed at [facility], [place] [place] [address], [place], [place] [address]   Urine Culture     Status: Abnormal   Collection Time: 11/13/20  8:53 PM   Specimen: Urine, Clean Catch  Result Value Ref Range Status   Specimen Description URINE, CLEAN CATCH  Final   Special Requests   Final    NONE Performed at [facility], [place] [address] [place], [place] [address]    Culture >=100,000 COLONIES/mL ENTEROBACTER CLOACAE (A)  Final   Report Status 11/16/2020 FINAL  Final   Organism ID, Bacteria ENTEROBACTER CLOACAE (A)  Final      Susceptibility   Enterobacter cloacae - MIC*    CEFAZOLIN >=64 RESISTANT Resistant     CEFEPIME <=0.12 SENSITIVE Sensitive     CIPROFLOXACIN <=0.25 SENSITIVE  Sensitive      GENTAMICIN <=1 SENSITIVE Sensitive     IMIPENEM 0.5 SENSITIVE Sensitive     NITROFURANTOIN 32 SENSITIVE Sensitive     TRIMETH/SULFA <=20 SENSITIVE Sensitive     PIP/TAZO <=4 SENSITIVE Sensitive     * >=100,000 COLONIES/mL ENTEROBACTER CLOACAE  Blood culture (routine x 2)     Status: None   Collection Time: 11/13/20 10:11 PM   Specimen: BLOOD LEFT FOREARM  Result Value Ref Range Status   Specimen Description BLOOD LEFT FOREARM  Final   Special Requests   Final    BOTTLES DRAWN AEROBIC AND ANAEROBIC Blood Culture results may not be optimal due to an inadequate volume of blood received in culture bottles   Culture   Final    NO GROWTH 5 DAYS Performed at [facility], [place] [address] [place], [place] [address]    Report Status 11/18/2020 FINAL  Final  Blood culture (routine x 2)     Status: None   Collection Time: 11/13/20 11:29 PM   Specimen: BLOOD RIGHT ARM  Result Value Ref Range Status   Specimen Description   Final    BLOOD RIGHT ARM Performed at [facility], [address] [place], [place] [address]    Special Requests   Final    BOTTLES DRAWN AEROBIC AND ANAEROBIC Blood Culture adequate volume Performed at [facility], [address], [place], [place] [address]    Culture   Final    NO GROWTH 5 DAYS Performed at [facility], [place] [address] [place], [place] [address]    Report Status 11/19/2020 FINAL  Final  Resp Panel by RT-PCR (Flu A&B, Covid) Nasopharyngeal Swab     Status: None   Collection Time: 11/21/20  8:22 AM   Specimen: Nasopharyngeal Swab; Nasopharyngeal(NP) swabs in vial transport medium  Result Value Ref Range Status   SARS Coronavirus 2 by RT PCR NEGATIVE NEGATIVE Final    Comment: (NOTE) SARS-CoV-2 target nucleic acids are NOT DETECTED.  The SARS-CoV-2 RNA is generally detectable in upper respiratory specimens during the acute phase of infection. The lowest concentration of SARS-CoV-2 viral copies this assay can  detect is 138 copies/mL. A negative result does not preclude SARS-Cov-2 infection and should not be used as the sole basis for treatment or other patient management decisions. A negative result may occur with  improper specimen collection/handling, submission of specimen other than nasopharyngeal swab, presence of viral mutation(s) within the areas targeted by this assay, and inadequate number of viral copies(<138 copies/mL). A negative result must be combined with clinical observations, patient history, and epidemiological information. The expected result is Negative.  Fact Sheet for Patients:  EntrepreneurPulse.com.au  Fact Sheet for Healthcare Providers:  IncredibleEmployment.be  This test is no t yet approved or cleared by the [place] FDA and  has been authorized for detection and/or diagnosis of SARS-CoV-2 by FDA under an Emergency Use Authorization (EUA). This EUA will remain  in effect (meaning this test can be used) for the duration of the COVID-19 declaration under Section 564(b)(1) of the Act, 21 U.S.C.section 360bbb-3(b)(1), unless the authorization is terminated  or revoked sooner.       Influenza A by PCR NEGATIVE NEGATIVE Final   Influenza B by PCR NEGATIVE NEGATIVE Final    Comment: (NOTE) The Xpert Xpress SARS-CoV-2/FLU/RSV plus assay is intended as an aid in the diagnosis of influenza from Nasopharyngeal swab specimens and should not be used  as a sole basis for treatment. Nasal washings and aspirates are unacceptable for Xpert Xpress SARS-CoV-2/FLU/RSV testing.  Fact Sheet for Patients: EntrepreneurPulse.com.au  Fact Sheet for Healthcare Providers: IncredibleEmployment.be  This test is not yet approved or cleared by the [place] FDA and has been authorized for detection and/or diagnosis of SARS-CoV-2 by FDA under an Emergency Use Authorization (EUA). This EUA will remain in  effect (meaning this test can be used) for the duration of the COVID-19 declaration under Section 564(b)(1) of the Act, 21 U.S.C. section 360bbb-3(b)(1), unless the authorization is terminated or revoked.  Performed at [facility], [place] [address], [place], [place] [address]     Please note: You were cared for by a hospitalist during your hospital stay. Once you are discharged, your primary care physician will handle any further medical issues. Please note that NO REFILLS for any discharge medications will be authorized once you are discharged, as it is imperative that you return to your primary care physician (or establish a relationship with a primary care physician if you do not have one) for your post hospital discharge needs so that they can reassess your need for medications and monitor your lab values.    Time coordinating discharge: 40 minutes  SIGNED:   Shelly Coss, MD  [facility] 11/21/2020, 11:53 AM Pager [phone]  If 7PM-7AM, please contact night-coverage [email or web address] Password [email or web address]

## 2020-11-21 NOTE — Progress Notes (Signed)
Physical Therapy Treatment Patient Details Name: Mark Clayton MRN: [id number] DOB: 11-03-1940 Today's Date: 11/21/2020    History of Present Illness 80 y/o male presented to ED on 8/17 with complaints of generalized weakness since 8/16. Suggesting UTI and possible pneumonia/CHF. PMH: CVA with R hemi in 2021, AICD placement, CHF with EF 20-25%, HTN    PT Comments    Patient progressing towards physical therapy goals. Patient resistant to assistance or close proximity of therapist to him. Patient required modA for bed mobility. Close supervision required for sit to stand transfer and min guard for stand pivot transfer. Continue to recommend SNF for ongoing Physical Therapy.      Follow Up Recommendations  SNF;Supervision/Assistance - 24 hour     Equipment Recommendations  None recommended by PT    Recommendations for Other Services       Precautions / Restrictions Precautions Precautions: Fall Restrictions Weight Bearing Restrictions: No    Mobility  Bed Mobility Overal bed mobility: Needs Assistance Bed Mobility: Supine to Sit     Supine to sit: Mod assist     General bed mobility comments: Patient able to advance B [name] off bed without assistance. ModA for trunk elevation with use of bed rail also    Transfers Overall transfer level: Needs assistance Equipment used: Rolling Giovannie Scerbo (2 wheeled) Transfers: Sit to/from [facility] Sit to Stand: Supervision Stand pivot transfers: Min guard       General transfer comment: very close supervision due to patient refusing therapist assistance. Increased time to perform but able to complete without physical assistance. Assist to brace RW as patient pulling on RW to assist with standing. Cues provided for hand placement but patient not receptive. Min guard to stand pivot towards R to recliner. No physical assistance required  Ambulation/Gait                 Stairs             Wheelchair  Mobility    Modified Rankin (Stroke Patients Only) Modified Rankin (Stroke Patients Only) Pre-Morbid Rankin Score: Severe disability Modified Rankin: Severe disability     Balance Overall balance assessment: Needs assistance Sitting-balance support: Single extremity supported;Feet supported Sitting balance-Leahy Scale: Fair     Standing balance support: Bilateral upper extremity supported Standing balance-Leahy Scale: Poor Standing balance comment: reliant on UE support                            Cognition Arousal/Alertness: Awake/alert Behavior During Therapy: Anxious Overall Cognitive Status: No family/caregiver present to determine baseline cognitive functioning                                 General Comments: Patient wanting to be independent with mobility and requesting for therapist to back away from him. Therapist within close proximity for safety and education provided. Intermittent confusion noted throughout session. Pleasantly confused but increased agitation when asked to perform task or assisting when patient doesn't want assistance.      Exercises      General Comments        Pertinent Vitals/Pain Pain Assessment: No/denies pain    Home Living                      Prior Function            PT Goals (current goals  can now be found in the care plan section) Acute Rehab PT Goals Patient Stated Goal: hope to go back home PT Goal Formulation: With patient Time For Goal Achievement: 11/28/20 Potential to Achieve Goals: Fair Progress towards PT goals: Progressing toward goals    Frequency    Min 2X/week      PT Plan Discharge plan needs to be updated    Co-evaluation              AM-PAC PT "6 Clicks" Mobility   Outcome Measure  Help needed turning from your back to your side while in a flat bed without using bedrails?: A Lot Help needed moving from lying on your back to sitting on the side of a flat  bed without using bedrails?: A Lot Help needed moving to and from a bed to a chair (including a wheelchair)?: A Little Help needed standing up from a chair using your arms (e.g., wheelchair or bedside chair)?: A Little Help needed to walk in hospital room?: A Lot Help needed climbing 3-5 steps with a railing? : A Lot 6 Click Score: 14    End of Session Equipment Utilized During Treatment: Gait belt Activity Tolerance: Patient tolerated treatment well Patient left: in chair;with call bell/phone within reach;with chair alarm set Nurse Communication: Mobility status PT Visit Diagnosis: Unsteadiness on feet (R26.81);Muscle weakness (generalized) (M62.81);Difficulty in walking, not elsewhere classified (R26.2);Other symptoms and signs involving the nervous system [id number])     Time: [id number] PT Time Calculation (min) (ACUTE ONLY): 23 min  Charges:  $Therapeutic Activity: 8-22 mins                     Niyonna Betsill A. Gilford Rile PT, DPT Acute Rehabilitation Services Pager [phone] Office [phone]    Linna Hoff 11/21/2020, 12:13 PM

## 2020-11-22 DIAGNOSIS — K5901 Slow transit constipation: Secondary | ICD-10-CM | POA: Diagnosis not present

## 2020-11-22 DIAGNOSIS — D539 Nutritional anemia, unspecified: Secondary | ICD-10-CM | POA: Diagnosis not present

## 2020-11-22 DIAGNOSIS — I429 Cardiomyopathy, unspecified: Secondary | ICD-10-CM | POA: Diagnosis not present

## 2020-11-22 DIAGNOSIS — N390 Urinary tract infection, site not specified: Secondary | ICD-10-CM | POA: Diagnosis not present

## 2020-11-22 DIAGNOSIS — I2510 Atherosclerotic heart disease of native coronary artery without angina pectoris: Secondary | ICD-10-CM | POA: Diagnosis not present

## 2020-11-22 DIAGNOSIS — R42 Dizziness and giddiness: Secondary | ICD-10-CM | POA: Diagnosis not present

## 2020-11-22 DIAGNOSIS — E46 Unspecified protein-calorie malnutrition: Secondary | ICD-10-CM | POA: Diagnosis not present

## 2020-11-22 DIAGNOSIS — R5381 Other malaise: Secondary | ICD-10-CM | POA: Diagnosis not present

## 2020-11-22 DIAGNOSIS — I4891 Unspecified atrial fibrillation: Secondary | ICD-10-CM | POA: Diagnosis not present

## 2020-11-22 DIAGNOSIS — G9341 Metabolic encephalopathy: Secondary | ICD-10-CM | POA: Diagnosis not present

## 2020-11-22 DIAGNOSIS — I5033 Acute on chronic diastolic (congestive) heart failure: Secondary | ICD-10-CM | POA: Diagnosis not present

## 2020-11-22 DIAGNOSIS — I679 Cerebrovascular disease, unspecified: Secondary | ICD-10-CM | POA: Diagnosis not present

## 2020-11-22 NOTE — Progress Notes (Signed)
Cardiology Office Note:    Date:  11/26/2020   ID:  [name], [name] 25-Oct-1940, MRN [id number]  PCP:  [name], MD   [name] Providers Cardiologist:  Candee Furbish, MD Electrophysiologist:  Virl Axe, MD      Referring MD: [name], *    History of Present Illness:    Mark Clayton is a 80 y.o. male here for follow-up of recent hospitalization, currently in skilled nursing facility.  In summary, he was admitted with acute metabolic encephalopathy suspected from UTI and other medical causes.  He remained confused at discharge but back to baseline.  Has generalized deconditioning.  Stroke work-up was negative.  Neurology was following and signed off.  He had had EF less than 20 with chronic bilateral pleural effusions.  IV Lasix was given and changed to oral 60 mg daily.  He also has permanent atrial fibrillation that is rate controlled on carvedilol and carvedilol.  Hospital records reviewed  He is here today in wheelchair, thin, slightly disheveled.  Somewhat confused still.  His family member here with him states that he lost a significant amount of fluid weight from his legs while in the hospital setting.  He is currently taking Lasix 60 mg a day.  History was held provided by family member.  He continues to have right-sided weakness debility.  Chronic systolic heart failure secondary to ischemic cardiomyopathy s/p CABG with ICD in place, persistent atrial fibrillation. Recently seen by Oda Kilts on 11/01/2020 had noted decline over the past several months.  Worsening of heart failure since April with OptiVol.    Last cath in 2020 see below Last ECHO 2021 during stroke, EF <20.    Past Medical History:  Diagnosis Date   AICD (automatic cardioverter/defibrillator) present 01/17/2003   Medtronic Maximo 7232CX ICD, serial [id number]   Anemia 02-06-11   takes oral iron   Arthritis    hands, knees   CAD (coronary artery disease) 2003   a. h/o  MI and CABG in 2003. b. s/p DES to SVG-RPDA-RPLB in 08/2014.   Cancer of sigmoid colon ([place]) 2012   a. s/p colon surgery.   Carotid bruit    Chronic systolic CHF (congestive heart failure) ([place])    a. EF 20% in 2014; b. 08/2017 Echo: EF 20-25%, diff HK, Gr3 DD. Triv AI. Mod MR. Sev dil LA. Mildly dil RV w/ mildly reduced RV fxn. Mildly dil RA. Mod TR. PASP 72mg.   Cough    Exudative age-related macular degeneration of left eye with active choroidal neovascularization ([facility] 07/12/2019   Exudative age-related macular degeneration, right eye, with inactive choroidal neovascularization ([facility] 07/12/2019   GERD (gastroesophageal reflux disease) 02-06-11   HTN (hypertension)    Hyperlipidemia    Ischemic cardiomyopathy    a. EF 20% in 2014. (Master study EF >20%); b. 08/2017 Echo: EF 20-25%, diff HK. Gr3 DD.   LV (left ventricular) mural thrombus    a. 12/2012 Echo: EF 20% with mural thrombus No evidence of thrombus on 08/2017 echo.   Macular degeneration    [place]ocardial infar[place]    2003   PAF (paroxysmal atrial fibrillation) ([facility])    a. CHA2DS2VASc = 5-->Xarelto/Tikosyn.   Ventricular tachycardia ([facility] 11/05/2020    Past Surgical History:  Procedure Laterality Date   CARDIAC CATHETERIZATION N/A 09/20/2014   Procedure: Left Heart Cath and Coronary Angiography;  Surgeon: JJettie Booze MD; LAD 95%, D1 100%, CFX liner percent, OM 200%, OM 390%, RCA  90%, LIMA-LAD okay, SVG-OM 2-OM 3 minimal disease, SVG-RPDA-RPLB 100% between the RPDA and RPL      CARDIAC CATHETERIZATION N/A 09/20/2014   Procedure: Coronary Stent Intervention;  Surgeon: Jettie Booze, MD; Synergy DES 4 x 24 mm reducing the stenosis to 5%    CARDIAC DEFIBRILLATOR PLACEMENT  01/17/03   6949 lead. Medtronic. remote-no; with later revision   CARDIOVERSION N/A 05/22/2016   Procedure: CARDIOVERSION;  Surgeon: Lelon Perla, MD;  Location: [facility] ENDOSCOPY;  Service: Cardiovascular;  Laterality: N/A;   CARDIOVERSION N/A 08/10/2017    Procedure: CARDIOVERSION;  Surgeon: Pixie Casino, MD;  Location: [facility] ENDOSCOPY;  Service: Cardiovascular;  Laterality: N/A;   CATARACT EXTRACTION W/ INTRAOCULAR LENS  IMPLANT, BILATERAL Bilateral June/-July 2009   Dr. Katy Fitch   CATARACT EXTRACTION W/PHACO Bilateral 2010   Dr. Katy Fitch   COLON RESECTION  02/09/2011   Procedure: LAPAROSCOPIC SIGMOID COLON RESECTION;  Surgeon: Pedro Earls, MD;  Location: [facility] ORS;  Service: General;  Laterality: N/A;  Laparoscopic Assisted Sigmoid Colectomy   COLON SURGERY     COLONOSCOPY  08/31/2011   Procedure: COLONOSCOPY;  Surgeon: Jerene Bears, MD;  Location: [facility] ENDOSCOPY;  Service: Gastroenterology;  Laterality: N/A;   COLONOSCOPY N/A 09/05/2012   Procedure: COLONOSCOPY;  Surgeon: Jerene Bears, MD;  Location: [facility] ENDOSCOPY;  Service: Gastroenterology;  Laterality: N/A;   COLONOSCOPY N/A 04/18/2013   Procedure: COLONOSCOPY;  Surgeon: Jerene Bears, MD;  Location: [facility] ENDOSCOPY;  Service: Gastroenterology;  Laterality: N/A;   COLONOSCOPY N/A 04/09/2014   Procedure: COLONOSCOPY;  Surgeon: Jerene Bears, MD;  Location: [facility] ENDOSCOPY;  Service: Gastroenterology;  Laterality: N/A;   COLONOSCOPY WITH PROPOFOL N/A 05/03/2017   Procedure: COLONOSCOPY WITH PROPOFOL;  Surgeon: Jerene Bears, MD;  Location: [facility] ENDOSCOPY;  Service: Gastroenterology;  Laterality: N/A;   CORONARY ANGIOPLASTY     CORONARY ARTERY BYPASS GRAFT  01/2002   LIMA-LAD, SVG-OM 2-OM 3, SVG-RPDA-RPLB   CORONARY STENT INTERVENTION N/A 11/22/2018   Procedure: CORONARY STENT INTERVENTION;  Surgeon: Nelva Bush, MD;  Location: [place] CV LAB;  Service: Cardiovascular;  Laterality: N/A;  SVG - RCA   ICD GENERATOR CHANGE  2010   Medtronic Virtuoso II VR ICD   ICD GENERATOR CHANGEOUT N/A 12/07/2018   Procedure: ICD GENERATOR CHANGEOUT;  Surgeon: Deboraha Sprang, MD;  Location: [place] CV LAB;  Service: Cardiovascular;  Laterality: N/A;   INGUINAL HERNIA REPAIR Right 2000's X 2   LAPAROSCOPIC RIGHT  HEMI COLECTOMY N/A 11/04/2012   Procedure: LAPAROSCOPIC RIGHT HEMI COLECTOMY;  Surgeon: Pedro Earls, MD;  Location: [facility];  Service: General;  Laterality: N/A;   LEFT HEART CATH AND CORS/GRAFTS ANGIOGRAPHY Left 11/22/2018   Procedure: LEFT HEART CATH AND CORS/GRAFTS ANGIOGRAPHY;  Surgeon: Nelva Bush, MD;  Location: [place] [facility];  Service: Cardiovascular;  Laterality: Left;   TONSILLECTOMY  ~ 1950    Current Medications: Current Meds  Medication Sig   atorvastatin (LIPITOR) 80 MG tablet TAKE 1 TABLET BY MOUTH  DAILY (Patient taking differently: Take 80 mg by mouth daily.)   baclofen (LIORESAL) 10 MG tablet Take 1 tablet (10 mg total) by mouth 2 (two) times daily as needed for muscle spasms.   carvedilol (COREG) 3.125 MG tablet Take 1 tablet (3.125 mg total) by mouth 2 (two) times daily with a meal.   ferrous sulfate 325 (65 FE) MG tablet Take 1 tablet (325 mg total) by mouth 2 (two) times daily with a meal.  furosemide (LASIX) 40 MG tablet Take 1.5 tablets (60 mg total) by mouth 2 (two) times daily. (Patient taking differently: Take 40 mg by mouth daily.)   meclizine (ANTIVERT) 25 MG tablet Take 25 mg by mouth 3 (three) times daily as needed for dizziness. 1/2 tablet every 14 days for dizziness   nitroGLYCERIN (NITROSTAT) 0.4 MG SL tablet DISSOLVE 1 TABLET UNDER THE TONGUE EVERY 5 MINUTES AS  NEEDED FOR CHEST PAIN. MAX  OF 3 TABLETS IN 15 MINUTES. CALL 911 IF PAIN PERSISTS. (Patient taking differently: Place 0.4 mg under the tongue every 5 (five) minutes as needed for chest pain.)   pantoprazole (PROTONIX) 40 MG tablet TAKE 1 TABLET BY MOUTH  DAILY (Patient taking differently: Take 40 mg by mouth daily.)   polyethylene glycol (MIRALAX / GLYCOLAX) 17 g packet Take 17 g by mouth daily as needed for mild constipation.   potassium chloride (KLOR-CON) 10 MEQ tablet Take 20 mEq by mouth daily.   rivaroxaban (XARELTO) 20 MG TABS tablet Take 20 mg by mouth daily with supper.   vitamin  B-12 (CYANOCOBALAMIN) 100 MCG tablet Take 100 mcg by mouth daily.      Allergies:   Buspar [buspirone], Latex, and Tape   Social History   Socioeconomic History   Marital status: Widowed    Spouse name: Not on file   Number of children: 3   Years of education: Not on file   Highest education level: Not on file  Occupational History   Occupation: self employed/ Art gallery manager  Tobacco Use   Smoking status: Never   Smokeless tobacco: Never  Vaping Use   Vaping Use: Never used  Substance and Sexual Activity   Alcohol use: Yes    Alcohol/week: 2.0 standard drinks    Types: 2 Cans of beer per week   Drug use: No   Sexual activity: Not Currently  Other Topics Concern   Not on file  Social History Narrative   Full time. Married.    Right handed   Drinks caffeine   Two story home   Social Determinants of Health   Financial Resource Strain: Low Risk    Difficulty of Paying Living Expenses: Not hard at all  Food Insecurity: No Food Insecurity   Worried About [name] fundraiser in the Last Year: Never true   Arboriculturist in the Last Year: Never true  Transportation Needs: No Transportation Needs   Lack of Transportation (Medical): No   Lack of Transportation (Non-Medical): No  Physical Activity: Insufficiently Active   Days of Exercise per Week: 2 days   Minutes of Exercise per Session: 60 min  Stress: No Stress Concern Present   Feeling of Stress : Not at all  Social Connections: Not on file     Family History: The patient's family history includes Alzheimer's disease in his mother; Heart disease in his father; Hyperlipidemia in his father and sister; Hypertension in his father and sister; Prostate cancer in his father. There is no history of Colon cancer, Esophageal cancer, Rectal cancer, or Stomach cancer.  ROS:   Please see the history of present illness.    (+)  All other systems reviewed and are negative.  EKGs/Labs/Other Studies Reviewed:    The following studies  were reviewed today:  ECHO 06/07/19:   1. No mural thrombus with Definity contrast. Left ventricular ejection  fraction, by estimation, is <20%. The left ventricle has severely  decreased function. The left ventricle demonstrates global hypokinesis  with  regional variation. The left ventricular   internal cavity size was moderately to severely dilated.   2. Right ventricular systolic function is moderately reduced. The right  ventricular size is mildly enlarged. A AICD wire is visualized. There is  moderately elevated pulmonary artery systolic pressure.   3. Left atrial size was moderately dilated.   4. The mitral valve is abnormal. Mild mitral valve regurgitation.   5. The aortic valve is tricuspid. Aortic valve regurgitation is mild.  Mild aortic valve sclerosis is present, with no evidence of aortic valve  stenosis.   6. The inferior vena cava is dilated in size with <50% respiratory  variability, suggesting right atrial pressure of 15 mmHg.   Comparison(s): Changes from prior study are noted. 09/03/2017: LVEF 20-25%.  Cath 11/22/18: Multivessel coronary artery disease, as outlined below.  Culprit lesion for the patient's unstable angina is most likely the anastomotic lesion at the SVG to rPDA.  Otherwise, coronary and graft disease appears stable from prior catheterization in 2016. Patent mid RCA stent with mild focal in-stent restenosis. Widely patent LIMA to LAD. Widely patent sequential SVG to ramus intermedius, OM2, and OM3. Patent sequential SVG to RPDA with 95% focal stenosis at the side to side anastomosis of graft to rPDA.  Jump portion to rPL remains chronically occluded. Moderately elevated left ventricular filling pressure. Successful PCI to SVG to RPDA using Resolute Onyx 2.75 x 26 mm drug-eluting stent with 0% residual stenosis and TIMI-3 flow.   Recommendations: Admit for overnight extended recovery. Gentle diuresis, given moderately elevated left ventricular filling  pressure. Restart rivaroxaban 20 mg daily tomorrow if no evidence of bleeding.  Patient will need to continue on rivaroxaban and clopidogrel 75 mg daily for at least 6 to 12 months, as tolerated. Aggressive secondary prevention.   Nelva Bush, MD  Diagnostic Dominance: Right    Intervention      Recent Labs: 11/13/2020: ALT 14 11/14/2020: B Natriuretic Peptide 1,881.2; TSH 10.380 11/20/2020: BUN 18; Creatinine, Ser 1.30; Hemoglobin 10.0; Magnesium 2.1; Platelets 241; Potassium 5.0; Sodium 136  Recent Lipid Panel    Component Value Date/Time   CHOL 78 11/14/2020 0305   CHOL 182 11/16/2018 0900   TRIG 57 11/14/2020 0305   HDL 31 (L) 11/14/2020 0305   HDL 56 11/16/2018 0900   CHOLHDL 2.5 11/14/2020 0305   VLDL 11 11/14/2020 0305   LDLCALC 36 11/14/2020 0305   LDLCALC 102 (H) 11/16/2018 0900   LDLDIRECT 104 (H) 03/28/2019 1018     Risk Assessment/Calculations:          Physical Exam:    VS:  BP 90/60   Pulse (!) 32   Ht 5' 9.5" (1.765 m)   Wt 119 lb (54 kg)   SpO2 99%   BMI 17.32 kg/m     Wt Readings from Last 3 Encounters:  11/26/20 119 lb (54 kg)  11/13/20 136 lb 0.4 oz (61.7 kg)  11/12/20 136 lb (61.7 kg)     GEN: Well nourished, well developed in no acute distress HEENT: Normal NECK:  No carotid bruits LYMPHATICS: No lymphadenopathy CARDIAC: IRRR, no murmurs, rubs, gallops RESPIRATORY:  Clear to auscultation without rales, wheezing or rhonchi  ABDOMEN: Soft, non-tender, non-distended MUSCULOSKELETAL:  No edema; No deformity  SKIN: Warm and dry NEUROLOGIC:  Alert and oriented x 3 PSYCHIATRIC:  Normal affect   ASSESSMENT:    1. Ischemic cardiomyopathy   2. Persistent atrial fibrillation   3. Coronary artery disease involving native coronary artery of native  heart with angina pectoris ([place])   4. Cardiac defibrillator  MDT VVI   5. Delirium   6. Chronic anticoagulation     PLAN:    In order of problems listed above:  Ischemic  cardiomyopathy No changes made today.  Continue with Lasix 60 mg a day.  Low-dose carvedilol.  EF 20%.  Persistent atrial fibrillation Currently on Xarelto 20 mg.  Clearance is still greater than 50.  If it decreases less than 50, would dose reduce to 15 mg.  Currently well controlled.  Labs from hospitalization reviewed.  Coronary artery disease involving native heart with angina pectoris ([place]) Stable with no anginal symptoms.  Cardiac catheterization previously reviewed.  Cardiac defibrillator  MDT VVI Stable, no recent VT.  Delirium Ultimate reason for hospitalization and skilled nursing facility.  I think would be unusual for his cardiomyopathy, decreased cardiac output to be a strong contributing factor to his delirium.  Chronic anticoagulation Continue with Xarelto as described above.  High risk medication.  Continue with lab work monitoring.  Recent lab values reviewed.  Stable.   Consulting paperwork filled out for skilled nursing facility.  Medication Adjustments/Labs and Tests Ordered: Current medicines are reviewed at length with the patient today.  Concerns regarding medicines are outlined above.  No orders of the defined types were placed in this encounter.   No orders of the defined types were placed in this encounter.   Patient Instructions  Medication Instructions:  The current medical regimen is effective;  continue present plan and medications.  *If you need a refill on your cardiac medications before your next appointment, please call your pharmacy*  Follow-Up: At [facility], you and your health needs are our priority.  As part of our continuing mission to provide you with exceptional heart care, we have created designated Provider Care Teams.  These Care Teams include your primary Cardiologist (physician) and Advanced Practice Providers (APPs -  Physician Assistants and Nurse Practitioners) who all work together to provide you with the care you need, when  you need it.  We recommend signing up for the patient portal called "MyChart".  Sign up information is provided on this After Visit Summary.  MyChart is used to connect with patients for Virtual Visits (Telemedicine).  Patients are able to view lab/test results, encounter notes, upcoming appointments, etc.  Non-urgent messages can be sent to your provider as well.   To learn more about what you can do with MyChart, go to [email or web address].    Your next appointment:   4 month(s)  The format for your next appointment:   In Person  Provider:   Concha Se   Thank you for choosing [facility]!!     I,Mathew Stumpf,acting as a scribe for Candee Furbish, MD.,have documented all relevant documentation on the behalf of Candee Furbish, MD,as directed by  Candee Furbish, MD while in the presence of Candee Furbish, MD.  I, Candee Furbish, MD, have reviewed all documentation for this visit. The documentation on 11/26/20 for the exam, diagnosis, procedures, and orders are all accurate and complete.   Signed, Candee Furbish, MD  11/26/2020 12:50 PM    [facility]

## 2020-11-23 DIAGNOSIS — M6281 Muscle weakness (generalized): Secondary | ICD-10-CM | POA: Diagnosis not present

## 2020-11-23 DIAGNOSIS — M6250 Muscle wasting and atrophy, not elsewhere classified, unspecified site: Secondary | ICD-10-CM | POA: Diagnosis not present

## 2020-11-23 DIAGNOSIS — I4891 Unspecified atrial fibrillation: Secondary | ICD-10-CM | POA: Diagnosis not present

## 2020-11-23 DIAGNOSIS — I10 Essential (primary) hypertension: Secondary | ICD-10-CM | POA: Diagnosis not present

## 2020-11-23 DIAGNOSIS — R2681 Unsteadiness on feet: Secondary | ICD-10-CM | POA: Diagnosis not present

## 2020-11-23 DIAGNOSIS — K219 Gastro-esophageal reflux disease without esophagitis: Secondary | ICD-10-CM | POA: Diagnosis not present

## 2020-11-23 DIAGNOSIS — I509 Heart failure, unspecified: Secondary | ICD-10-CM | POA: Diagnosis not present

## 2020-11-23 DIAGNOSIS — E782 Mixed hyperlipidemia: Secondary | ICD-10-CM | POA: Diagnosis not present

## 2020-11-23 DIAGNOSIS — D649 Anemia, unspecified: Secondary | ICD-10-CM | POA: Diagnosis not present

## 2020-11-23 DIAGNOSIS — G9341 Metabolic encephalopathy: Secondary | ICD-10-CM | POA: Diagnosis not present

## 2020-11-23 DIAGNOSIS — N390 Urinary tract infection, site not specified: Secondary | ICD-10-CM | POA: Diagnosis not present

## 2020-11-23 DIAGNOSIS — Z950 Presence of cardiac pacemaker: Secondary | ICD-10-CM | POA: Diagnosis not present

## 2020-11-25 ENCOUNTER — Telehealth: Payer: Self-pay

## 2020-11-25 DIAGNOSIS — Z950 Presence of cardiac pacemaker: Secondary | ICD-10-CM | POA: Diagnosis not present

## 2020-11-25 DIAGNOSIS — I509 Heart failure, unspecified: Secondary | ICD-10-CM | POA: Diagnosis not present

## 2020-11-25 DIAGNOSIS — M6250 Muscle wasting and atrophy, not elsewhere classified, unspecified site: Secondary | ICD-10-CM | POA: Diagnosis not present

## 2020-11-25 DIAGNOSIS — I10 Essential (primary) hypertension: Secondary | ICD-10-CM | POA: Diagnosis not present

## 2020-11-25 DIAGNOSIS — K219 Gastro-esophageal reflux disease without esophagitis: Secondary | ICD-10-CM | POA: Diagnosis not present

## 2020-11-25 DIAGNOSIS — R2681 Unsteadiness on feet: Secondary | ICD-10-CM | POA: Diagnosis not present

## 2020-11-25 DIAGNOSIS — N390 Urinary tract infection, site not specified: Secondary | ICD-10-CM | POA: Diagnosis not present

## 2020-11-25 DIAGNOSIS — I4891 Unspecified atrial fibrillation: Secondary | ICD-10-CM | POA: Diagnosis not present

## 2020-11-25 DIAGNOSIS — G9341 Metabolic encephalopathy: Secondary | ICD-10-CM | POA: Diagnosis not present

## 2020-11-25 DIAGNOSIS — E782 Mixed hyperlipidemia: Secondary | ICD-10-CM | POA: Diagnosis not present

## 2020-11-25 DIAGNOSIS — D649 Anemia, unspecified: Secondary | ICD-10-CM | POA: Diagnosis not present

## 2020-11-25 DIAGNOSIS — M6281 Muscle weakness (generalized): Secondary | ICD-10-CM | POA: Diagnosis not present

## 2020-11-25 NOTE — Telephone Encounter (Signed)
Attempted call to patient.  Left message to send ICM manual remote transmission today for review.  Left call back number for return call.

## 2020-11-25 NOTE — Telephone Encounter (Signed)
Spoke with patient.  He reports he is currently hospitalized by describes it as clinic type setting.  Unsure when he will return home.  Advised will reschedule remote transmission for 12/16/2020.  Unable to get a definitive answer if he will be at the scheduled OV for Dr Marlou Porch tomorrow, 8/30.  Message sent to Dr Marlou Porch nurse to inform patient currently in a facility and unsure if will make his appointment.

## 2020-11-26 ENCOUNTER — Other Ambulatory Visit: Payer: Self-pay

## 2020-11-26 ENCOUNTER — Other Ambulatory Visit: Payer: Medicare Other

## 2020-11-26 ENCOUNTER — Encounter: Payer: Self-pay | Admitting: Cardiology

## 2020-11-26 ENCOUNTER — Ambulatory Visit: Payer: Medicare Other | Admitting: Cardiology

## 2020-11-26 DIAGNOSIS — Z95810 Presence of automatic (implantable) cardiac defibrillator: Secondary | ICD-10-CM

## 2020-11-26 DIAGNOSIS — R410 Disorientation, unspecified: Secondary | ICD-10-CM | POA: Diagnosis not present

## 2020-11-26 DIAGNOSIS — Z7901 Long term (current) use of anticoagulants: Secondary | ICD-10-CM

## 2020-11-26 DIAGNOSIS — I255 Ischemic cardiomyopathy: Secondary | ICD-10-CM

## 2020-11-26 DIAGNOSIS — I25119 Atherosclerotic heart disease of native coronary artery with unspecified angina pectoris: Secondary | ICD-10-CM | POA: Diagnosis not present

## 2020-11-26 DIAGNOSIS — I4819 Other persistent atrial fibrillation: Secondary | ICD-10-CM | POA: Diagnosis not present

## 2020-11-26 NOTE — Patient Instructions (Signed)
Medication Instructions:  The current medical regimen is effective;  continue present plan and medications.  *If you need a refill on your cardiac medications before your next appointment, please call your pharmacy*  Follow-Up: At [facility], you and your health needs are our priority.  As part of our continuing mission to provide you with exceptional heart care, we have created designated Provider Care Teams.  These Care Teams include your primary Cardiologist (physician) and Advanced Practice Providers (APPs -  Physician Assistants and Nurse Practitioners) who all work together to provide you with the care you need, when you need it.  We recommend signing up for the patient portal called "MyChart".  Sign up information is provided on this After Visit Summary.  MyChart is used to connect with patients for Virtual Visits (Telemedicine).  Patients are able to view lab/test results, encounter notes, upcoming appointments, etc.  Non-urgent messages can be sent to your provider as well.   To learn more about what you can do with MyChart, go to [email or web address].    Your next appointment:   4 month(s)  The format for your next appointment:   In Person  Provider:   Concha Se   Thank you for choosing [facility]!!

## 2020-11-26 NOTE — Assessment & Plan Note (Signed)
Continue with Xarelto as described above.  High risk medication.  Continue with lab work monitoring.  Recent lab values reviewed.  Stable.

## 2020-11-26 NOTE — Assessment & Plan Note (Signed)
Ultimate reason for hospitalization and skilled nursing facility.  I think would be unusual for his cardiomyopathy, decreased cardiac output to be a strong contributing factor to his delirium.

## 2020-11-26 NOTE — Assessment & Plan Note (Signed)
Stable with no anginal symptoms.  Cardiac catheterization previously reviewed.

## 2020-11-26 NOTE — Assessment & Plan Note (Signed)
No changes made today.  Continue with Lasix 60 mg a day.  Low-dose carvedilol.  EF 20%.

## 2020-11-26 NOTE — Assessment & Plan Note (Signed)
Currently on Xarelto 20 mg.  Clearance is still greater than 50.  If it decreases less than 50, would dose reduce to 15 mg.  Currently well controlled.  Labs from hospitalization reviewed.

## 2020-11-26 NOTE — Assessment & Plan Note (Signed)
Stable, no recent VT.

## 2020-11-27 ENCOUNTER — Ambulatory Visit: Payer: Medicare Other | Admitting: Neurology

## 2020-11-27 ENCOUNTER — Encounter: Payer: Self-pay | Admitting: Neurology

## 2020-11-27 VITALS — BP 94/51 | HR 70

## 2020-11-27 DIAGNOSIS — R42 Dizziness and giddiness: Secondary | ICD-10-CM | POA: Diagnosis not present

## 2020-11-27 DIAGNOSIS — R441 Visual hallucinations: Secondary | ICD-10-CM | POA: Diagnosis not present

## 2020-11-27 DIAGNOSIS — K5901 Slow transit constipation: Secondary | ICD-10-CM | POA: Diagnosis not present

## 2020-11-27 DIAGNOSIS — Z8673 Personal history of transient ischemic attack (TIA), and cerebral infarction without residual deficits: Secondary | ICD-10-CM | POA: Diagnosis not present

## 2020-11-27 DIAGNOSIS — I4891 Unspecified atrial fibrillation: Secondary | ICD-10-CM | POA: Diagnosis not present

## 2020-11-27 DIAGNOSIS — G9341 Metabolic encephalopathy: Secondary | ICD-10-CM | POA: Diagnosis not present

## 2020-11-27 DIAGNOSIS — N390 Urinary tract infection, site not specified: Secondary | ICD-10-CM | POA: Diagnosis not present

## 2020-11-27 DIAGNOSIS — I5033 Acute on chronic diastolic (congestive) heart failure: Secondary | ICD-10-CM | POA: Diagnosis not present

## 2020-11-27 DIAGNOSIS — I429 Cardiomyopathy, unspecified: Secondary | ICD-10-CM | POA: Diagnosis not present

## 2020-11-27 DIAGNOSIS — R404 Transient alteration of awareness: Secondary | ICD-10-CM | POA: Diagnosis not present

## 2020-11-27 MED ORDER — DIVALPROEX SODIUM ER 250 MG PO TB24: ORAL_TABLET | ORAL | 6 refills | Status: DC

## 2020-11-27 NOTE — Progress Notes (Signed)
NEUROLOGY FOLLOW UP OFFICE NOTE  Mark Clayton [id number] 14-Apr-1940  HISTORY OF PRESENT ILLNESS: I had the pleasure of seeing Mark Clayton in follow-up in the neurology clinic on 11/27/2020.  The patient was last seen a month ago for staring spells and visual hallucinations. He is accompanied by his daughter Mark Clayton who helps supplement the history today. Records and images were personally reviewed where available.  I personally reviewed head CT without contrast done 10/29/20 which did not show any acute changes, there is left frontal encephalomalacia. His 1-hour EEG done 09/2020 showed occasional left hemisphere slowing, no epileptiform discharges. He was recently admitted from 8/17 to 8/25 for progressive weakness and confusion. He was seen by Neurology with note of significant aphasia, getting stuck on words and losing his train of thought. Head CT no acute changes, altered mental status felt due to UTI, acute on chronic CHF.  He was discharged to [facility]. Today he is awake, alert, able to follow commands. Mark Clayton reports that she was told he had 2 staring spells while he was in the hospital. He has also been having aggression at Mark Clayton, refusing to do things. No mention of staring spells at Mark Clayton. He continues to report visual hallucinations. He reports a fall yesterday morning, Mark Clayton was unaware of this. He states he was getting on the commode and his right leg buckled on him.    History on Initial Assessment 10/11/2020: This is a 80 year old right-handed man with a history of atrial fibrillation on Xarelto, s/p ICD placement, hypertension, colon cancer, left MCA stroke with traumatic left vertex SDH in March 2021 with residual right hemiparesis presenting for evaluation of possible seizures. His granddaughter Mark Clayton lives with him. After the stroke in 05/2019, Mark Clayton recalls that he had episodes of zoning out/unresponsive for 30 seconds to a couple of minutes that stopped for a while, then  recurred, last episode was 3-4 weeks ago. There is no prior warning, he is amnestic of events. There would be a quick jerk after the staring. Lately his right arm feels like there is electricity. His right leg is numb, sometimes it hurts/aches. He has a walker at home. He had a fall a few months ago and since then started having visual hallucinations that are non-threatening to him. He says "I see things," he sees people, animals, figures, things on the floor. No auditory component. He says he sees them all the time, people dancing in the mirrors, animals playing outside. He blinks and they would be gone. One time he wanted to talk to a cop next to Mark Clayton. He was started on Remeron in May 2022. He gets disoriented mostly at night, but this occurs in the daytime as well. He says the pictures on the walls change, he points to a painting of boats in the office, he says it changes into a house when he closes his eyes. He wakes up and there is a TV show going on, but he does not have a TV in his room. Mark Clayton notes that anxiety also started at that time. He gets panic attacks and sometimes may have the zoning out, occurring every couple of weeks. He may grip his knees but no clear dystonia or automatisms. It takes him about 1.5 minutes to recognize her. No associated tongue bite or incontinence. A few nights ago he woke up and did not remember where he was or Mark Clayton's name. Mark Clayton has lived with him since she was born, he raised her. She started helping  with medications a year ago. She states it was not due to memory problems but due to needing to get things together physically. He has started having problems remembering names. She took over finances a year ago. He does not drive. He gets agitated a lot because he used to be very independent and gets angry about it. He denies any headaches, dysarthria/dysphagia, focal numbness/tingling/weakness, myoclonic jerks. He get dizzy and takes meclizine for vertigo, but also  has periods where he feels lightheaded/faint. This has gotten worse recently as well. No loss of consciousness. With macular degeneration, he can count fingers. He has neck pain and some constipation. He had a fall when he stood up and his foot caught on the wheelchair, bumping his head a little.   I personally reviewed imaging from 05/2019, last head CT showed resolution of ICH including SAH and SDH. Head CT showed left parietal infarct.     Lab Results  Component Value Date   TSH 10.380 (H) 11/14/2020   Lab Results  Component Value Date   VITAMINB12 1,455 (H) 11/14/2020    PAST MEDICAL HISTORY: Past Medical History:  Diagnosis Date   AICD (automatic cardioverter/defibrillator) present 01/17/2003   Medtronic Maximo 7232CX ICD, serial [id number]   Anemia 02-06-11   takes oral iron   Arthritis    hands, knees   CAD (coronary artery disease) 2003   a. h/o MI and CABG in 2003. b. s/p DES to SVG-RPDA-RPLB in 08/2014.   Cancer of sigmoid colon ([place]) 2012   a. s/p colon surgery.   Carotid bruit    Chronic systolic CHF (congestive heart failure) ([place])    a. EF 20% in 2014; b. 08/2017 Echo: EF 20-25%, diff HK, Gr3 DD. Triv AI. Mod MR. Sev dil LA. Mildly dil RV w/ mildly reduced RV fxn. Mildly dil RA. Mod TR. PASP 51mg.   Cough    Exudative age-related macular degeneration of left eye with active choroidal neovascularization ([facility] 07/12/2019   Exudative age-related macular degeneration, right eye, with inactive choroidal neovascularization ([facility] 07/12/2019   GERD (gastroesophageal reflux disease) 02-06-11   HTN (hypertension)    Hyperlipidemia    Ischemic cardiomyopathy    a. EF 20% in 2014. (Master study EF >20%); b. 08/2017 Echo: EF 20-25%, diff HK. Gr3 DD.   LV (left ventricular) mural thrombus    a. 12/2012 Echo: EF 20% with mural thrombus No evidence of thrombus on 08/2017 echo.   Macular degeneration    Myocardial infarct ([facility]    2003   PAF (paroxysmal atrial fibrillation) ([facility])     a. CHA2DS2VASc = 5-->Xarelto/Tikosyn.   Ventricular tachycardia ([facility] 11/05/2020    MEDICATIONS: Current Outpatient Medications on File Prior to Visit  Medication Sig Dispense Refill   atorvastatin (LIPITOR) 80 MG tablet TAKE 1 TABLET BY MOUTH  DAILY (Patient taking differently: Take 80 mg by mouth daily.) 90 tablet 3   baclofen (LIORESAL) 10 MG tablet Take 1 tablet (10 mg total) by mouth 2 (two) times daily as needed for muscle spasms. 60 each 2   carvedilol (COREG) 3.125 MG tablet Take 1 tablet (3.125 mg total) by mouth 2 (two) times daily with a meal.     ferrous sulfate 325 (65 FE) MG tablet Take 1 tablet (325 mg total) by mouth 2 (two) times daily with a meal.  3   furosemide (LASIX) 40 MG tablet Take 1.5 tablets (60 mg total) by mouth 2 (two) times daily. (Patient taking differently: Take 40 mg by mouth daily.)  270 tablet 3   meclizine (ANTIVERT) 25 MG tablet Take 25 mg by mouth 3 (three) times daily as needed for dizziness. 1/2 tablet every 14 days for dizziness     nitroGLYCERIN (NITROSTAT) 0.4 MG SL tablet DISSOLVE 1 TABLET UNDER THE TONGUE EVERY 5 MINUTES AS  NEEDED FOR CHEST PAIN. MAX  OF 3 TABLETS IN 15 MINUTES. CALL 911 IF PAIN PERSISTS. (Patient taking differently: Place 0.4 mg under the tongue every 5 (five) minutes as needed for chest pain.) 100 tablet 3   pantoprazole (PROTONIX) 40 MG tablet TAKE 1 TABLET BY MOUTH  DAILY (Patient taking differently: Take 40 mg by mouth daily.) 90 tablet 3   polyethylene glycol (MIRALAX / GLYCOLAX) 17 g packet Take 17 g by mouth daily as needed for mild constipation.     potassium chloride (KLOR-CON) 10 MEQ tablet Take 20 mEq by mouth daily.     rivaroxaban (XARELTO) 20 MG TABS tablet Take 20 mg by mouth daily with supper.     vitamin B-12 (CYANOCOBALAMIN) 100 MCG tablet Take 100 mcg by mouth daily.      No current facility-administered medications on file prior to visit.    ALLERGIES: Allergies  Allergen Reactions   Buspar [Buspirone]  Other (See Comments)    Fatigue, body aches and shortness of breath    Latex Rash   Tape Rash and Other (See Comments)    USE PAPER    FAMILY HISTORY: Family History  Problem Relation Age of Onset   Hypertension Father    Hyperlipidemia Father    Heart disease Father    Prostate cancer Father    Alzheimer's disease Mother    Hypertension Sister    Hyperlipidemia Sister    Colon cancer Neg Hx    Esophageal cancer Neg Hx    Rectal cancer Neg Hx    Stomach cancer Neg Hx     SOCIAL HISTORY: Social History   Socioeconomic History   Marital status: Widowed    Spouse name: Not on file   Number of children: 3   Years of education: Not on file   Highest education level: Not on file  Occupational History   Occupation: self employed/ Art gallery manager  Tobacco Use   Smoking status: Never   Smokeless tobacco: Never  Vaping Use   Vaping Use: Never used  Substance and Sexual Activity   Alcohol use: Yes    Alcohol/week: 2.0 standard drinks    Types: 2 Cans of beer per week   Drug use: No   Sexual activity: Not Currently  Other Topics Concern   Not on file  Social History Narrative   Full time. Married.    Right handed   Drinks caffeine   Two story home   Social Determinants of Health   Financial Resource Strain: Low Risk    Difficulty of Paying Living Expenses: Not hard at all  Food Insecurity: No Food Insecurity   Worried About Mark Clayton fundraiser in the Last Year: Never true   Arboriculturist in the Last Year: Never true  Transportation Needs: No Transportation Needs   Lack of Transportation (Medical): No   Lack of Transportation (Non-Medical): No  Physical Activity: Insufficiently Active   Days of Exercise per Week: 2 days   Minutes of Exercise per Session: 60 min  Stress: No Stress Concern Present   Feeling of Stress : Not at all  Social Connections: Not on file  Intimate Partner Violence: Not on file  PHYSICAL EXAM: Vitals:   11/27/20 1422  BP: (!) 94/51   Pulse: 70  SpO2: 100%   General: No acute distress Head:  Normocephalic/atraumatic Skin/Extremities: No rash, no edema Neurological Exam: alert and awake. No aphasia or dysarthria. Fund of knowledge is reduced. Attention and concentration are reduced.   Cranial nerves: Pupils equal, round. Extraocular movements intact with no nystagmus. Visual fields full.  No facial asymmetry.  Motor: Bulk and tone normal, muscle strength 5/5 on left UE and LE, 4/5 right shoulder abduction, otherwise 5/5 distally, 4-/5 right hip flexion. Finger to nose testing intact.  Gait not tested   IMPRESSION: This is a 80 yo RH man with a history of atrial fibrillation on Xarelto, s/p ICD placement, hypertension, colon cancer, left MCA stroke with traumatic left vertex SDH in March 2021 with residual right hemiparesis with recurrent episodes of unresponsiveness. Repeat head CTs have shown chronic left frontal parietal encephalomalacia. EEG showed occasional left hemisphere slowing. We discussed that although EEG did not show any epileptiform activity, would recommend starting seizure medication for symptoms suggestive of focal impaired awareness seizures likely arising from old stroke. He is also having more behavioral issues. We agreed to start Depakote ER '250mg'$  qhs x 1 week, then increase to '500mg'$  qhs. Side effects discussed. Follow-up in 3 months, call for any changes.   Thank you for allowing me to participate in his care.  Please do not hesitate to call for any questions or concerns.    Ellouise Newer, M.D.   CC: Dr. Sharlet Salina

## 2020-11-27 NOTE — Patient Instructions (Signed)
Start Depakote ER '250mg'$ : Take 1 tablet every night for 1 week, then increase to 2 tablets every night  2. Follow-up in 3 months, call for any changes   Seizure Precautions: 1. If medication has been prescribed for you to prevent seizures, take it exactly as directed.  Do not stop taking the medicine without talking to your doctor first, even if you have not had a seizure in a long time.   2. Avoid activities in which a seizure would cause danger to yourself or to others.  Don't operate dangerous machinery, swim alone, or climb in high or dangerous places, such as on ladders, roofs, or girders.  Do not drive unless your doctor says you may.  3. If you have any warning that you may have a seizure, lay down in a safe place where you can't hurt yourself.    4.  No driving for 6 months from last seizure, as per [facility].   Please refer to the following link on the [facility] website for more information: http://[email or web address]/[email or web address]/drivingu.cfm   5.  Maintain good sleep hygiene.  6.  Contact your doctor if you have any problems that may be related to the medicine you are taking.  7.  Call 911 and bring the patient back to the ED if:        A.  The seizure lasts longer than 5 minutes.       B.  The patient doesn't awaken shortly after the seizure  C.  The patient has new problems such as difficulty seeing, speaking or moving  D.  The patient was injured during the seizure  E.  The patient has a temperature over 102 F (39C)  F.  The patient vomited and now is having trouble breathing

## 2020-11-28 DIAGNOSIS — E782 Mixed hyperlipidemia: Secondary | ICD-10-CM | POA: Diagnosis not present

## 2020-11-28 DIAGNOSIS — D649 Anemia, unspecified: Secondary | ICD-10-CM | POA: Diagnosis not present

## 2020-11-28 DIAGNOSIS — R42 Dizziness and giddiness: Secondary | ICD-10-CM | POA: Diagnosis not present

## 2020-11-28 DIAGNOSIS — M6250 Muscle wasting and atrophy, not elsewhere classified, unspecified site: Secondary | ICD-10-CM | POA: Diagnosis not present

## 2020-11-28 DIAGNOSIS — K219 Gastro-esophageal reflux disease without esophagitis: Secondary | ICD-10-CM | POA: Diagnosis not present

## 2020-11-28 DIAGNOSIS — Z950 Presence of cardiac pacemaker: Secondary | ICD-10-CM | POA: Diagnosis not present

## 2020-11-28 DIAGNOSIS — I5033 Acute on chronic diastolic (congestive) heart failure: Secondary | ICD-10-CM | POA: Diagnosis not present

## 2020-11-28 DIAGNOSIS — G9341 Metabolic encephalopathy: Secondary | ICD-10-CM | POA: Diagnosis not present

## 2020-11-28 DIAGNOSIS — I509 Heart failure, unspecified: Secondary | ICD-10-CM | POA: Diagnosis not present

## 2020-11-28 DIAGNOSIS — M6281 Muscle weakness (generalized): Secondary | ICD-10-CM | POA: Diagnosis not present

## 2020-11-28 DIAGNOSIS — I10 Essential (primary) hypertension: Secondary | ICD-10-CM | POA: Diagnosis not present

## 2020-11-28 DIAGNOSIS — N390 Urinary tract infection, site not specified: Secondary | ICD-10-CM | POA: Diagnosis not present

## 2020-11-28 DIAGNOSIS — R2681 Unsteadiness on feet: Secondary | ICD-10-CM | POA: Diagnosis not present

## 2020-11-28 DIAGNOSIS — I4891 Unspecified atrial fibrillation: Secondary | ICD-10-CM | POA: Diagnosis not present

## 2020-11-29 DIAGNOSIS — I429 Cardiomyopathy, unspecified: Secondary | ICD-10-CM | POA: Diagnosis not present

## 2020-11-29 DIAGNOSIS — I5033 Acute on chronic diastolic (congestive) heart failure: Secondary | ICD-10-CM | POA: Diagnosis not present

## 2020-11-29 DIAGNOSIS — I4891 Unspecified atrial fibrillation: Secondary | ICD-10-CM | POA: Diagnosis not present

## 2020-11-29 DIAGNOSIS — D649 Anemia, unspecified: Secondary | ICD-10-CM | POA: Diagnosis not present

## 2020-11-29 DIAGNOSIS — I951 Orthostatic hypotension: Secondary | ICD-10-CM | POA: Diagnosis not present

## 2020-12-02 DIAGNOSIS — I509 Heart failure, unspecified: Secondary | ICD-10-CM | POA: Diagnosis not present

## 2020-12-02 DIAGNOSIS — K219 Gastro-esophageal reflux disease without esophagitis: Secondary | ICD-10-CM | POA: Diagnosis not present

## 2020-12-02 DIAGNOSIS — I4891 Unspecified atrial fibrillation: Secondary | ICD-10-CM | POA: Diagnosis not present

## 2020-12-02 DIAGNOSIS — N390 Urinary tract infection, site not specified: Secondary | ICD-10-CM | POA: Diagnosis not present

## 2020-12-02 DIAGNOSIS — R2681 Unsteadiness on feet: Secondary | ICD-10-CM | POA: Diagnosis not present

## 2020-12-02 DIAGNOSIS — E782 Mixed hyperlipidemia: Secondary | ICD-10-CM | POA: Diagnosis not present

## 2020-12-02 DIAGNOSIS — M6250 Muscle wasting and atrophy, not elsewhere classified, unspecified site: Secondary | ICD-10-CM | POA: Diagnosis not present

## 2020-12-02 DIAGNOSIS — D649 Anemia, unspecified: Secondary | ICD-10-CM | POA: Diagnosis not present

## 2020-12-02 DIAGNOSIS — G9341 Metabolic encephalopathy: Secondary | ICD-10-CM | POA: Diagnosis not present

## 2020-12-02 DIAGNOSIS — Z950 Presence of cardiac pacemaker: Secondary | ICD-10-CM | POA: Diagnosis not present

## 2020-12-02 DIAGNOSIS — I10 Essential (primary) hypertension: Secondary | ICD-10-CM | POA: Diagnosis not present

## 2020-12-02 DIAGNOSIS — M6281 Muscle weakness (generalized): Secondary | ICD-10-CM | POA: Diagnosis not present

## 2020-12-03 DIAGNOSIS — I951 Orthostatic hypotension: Secondary | ICD-10-CM | POA: Diagnosis not present

## 2020-12-03 DIAGNOSIS — I4891 Unspecified atrial fibrillation: Secondary | ICD-10-CM | POA: Diagnosis not present

## 2020-12-03 DIAGNOSIS — E876 Hypokalemia: Secondary | ICD-10-CM | POA: Diagnosis not present

## 2020-12-03 DIAGNOSIS — I5033 Acute on chronic diastolic (congestive) heart failure: Secondary | ICD-10-CM | POA: Diagnosis not present

## 2020-12-04 DIAGNOSIS — I5033 Acute on chronic diastolic (congestive) heart failure: Secondary | ICD-10-CM | POA: Diagnosis not present

## 2020-12-04 DIAGNOSIS — G9341 Metabolic encephalopathy: Secondary | ICD-10-CM | POA: Diagnosis not present

## 2020-12-04 DIAGNOSIS — I951 Orthostatic hypotension: Secondary | ICD-10-CM | POA: Diagnosis not present

## 2020-12-04 DIAGNOSIS — I429 Cardiomyopathy, unspecified: Secondary | ICD-10-CM | POA: Diagnosis not present

## 2020-12-05 DIAGNOSIS — I10 Essential (primary) hypertension: Secondary | ICD-10-CM | POA: Diagnosis not present

## 2020-12-05 DIAGNOSIS — I4891 Unspecified atrial fibrillation: Secondary | ICD-10-CM | POA: Diagnosis not present

## 2020-12-05 DIAGNOSIS — R2681 Unsteadiness on feet: Secondary | ICD-10-CM | POA: Diagnosis not present

## 2020-12-05 DIAGNOSIS — D649 Anemia, unspecified: Secondary | ICD-10-CM | POA: Diagnosis not present

## 2020-12-05 DIAGNOSIS — I509 Heart failure, unspecified: Secondary | ICD-10-CM | POA: Diagnosis not present

## 2020-12-05 DIAGNOSIS — M6281 Muscle weakness (generalized): Secondary | ICD-10-CM | POA: Diagnosis not present

## 2020-12-05 DIAGNOSIS — K219 Gastro-esophageal reflux disease without esophagitis: Secondary | ICD-10-CM | POA: Diagnosis not present

## 2020-12-05 DIAGNOSIS — N390 Urinary tract infection, site not specified: Secondary | ICD-10-CM | POA: Diagnosis not present

## 2020-12-05 DIAGNOSIS — Z950 Presence of cardiac pacemaker: Secondary | ICD-10-CM | POA: Diagnosis not present

## 2020-12-05 DIAGNOSIS — E782 Mixed hyperlipidemia: Secondary | ICD-10-CM | POA: Diagnosis not present

## 2020-12-05 DIAGNOSIS — M6250 Muscle wasting and atrophy, not elsewhere classified, unspecified site: Secondary | ICD-10-CM | POA: Diagnosis not present

## 2020-12-05 DIAGNOSIS — G9341 Metabolic encephalopathy: Secondary | ICD-10-CM | POA: Diagnosis not present

## 2020-12-06 DIAGNOSIS — R252 Cramp and spasm: Secondary | ICD-10-CM | POA: Diagnosis not present

## 2020-12-06 DIAGNOSIS — K5901 Slow transit constipation: Secondary | ICD-10-CM | POA: Diagnosis not present

## 2020-12-06 DIAGNOSIS — Z0189 Encounter for other specified special examinations: Secondary | ICD-10-CM | POA: Diagnosis not present

## 2020-12-09 DIAGNOSIS — D649 Anemia, unspecified: Secondary | ICD-10-CM | POA: Diagnosis not present

## 2020-12-09 DIAGNOSIS — G9341 Metabolic encephalopathy: Secondary | ICD-10-CM | POA: Diagnosis not present

## 2020-12-12 DIAGNOSIS — I4891 Unspecified atrial fibrillation: Secondary | ICD-10-CM | POA: Diagnosis not present

## 2020-12-12 DIAGNOSIS — I679 Cerebrovascular disease, unspecified: Secondary | ICD-10-CM | POA: Diagnosis not present

## 2020-12-12 DIAGNOSIS — I5033 Acute on chronic diastolic (congestive) heart failure: Secondary | ICD-10-CM | POA: Diagnosis not present

## 2020-12-12 DIAGNOSIS — K5901 Slow transit constipation: Secondary | ICD-10-CM | POA: Diagnosis not present

## 2020-12-12 DIAGNOSIS — W19XXXA Unspecified fall, initial encounter: Secondary | ICD-10-CM | POA: Diagnosis not present

## 2020-12-13 DIAGNOSIS — G9341 Metabolic encephalopathy: Secondary | ICD-10-CM | POA: Diagnosis not present

## 2020-12-13 DIAGNOSIS — I679 Cerebrovascular disease, unspecified: Secondary | ICD-10-CM | POA: Diagnosis not present

## 2020-12-13 DIAGNOSIS — I951 Orthostatic hypotension: Secondary | ICD-10-CM | POA: Diagnosis not present

## 2020-12-13 DIAGNOSIS — M79661 Pain in right lower leg: Secondary | ICD-10-CM | POA: Diagnosis not present

## 2020-12-13 DIAGNOSIS — N390 Urinary tract infection, site not specified: Secondary | ICD-10-CM | POA: Diagnosis not present

## 2020-12-13 DIAGNOSIS — I5033 Acute on chronic diastolic (congestive) heart failure: Secondary | ICD-10-CM | POA: Diagnosis not present

## 2020-12-13 DIAGNOSIS — I4891 Unspecified atrial fibrillation: Secondary | ICD-10-CM | POA: Diagnosis not present

## 2020-12-13 DIAGNOSIS — M25561 Pain in right knee: Secondary | ICD-10-CM | POA: Diagnosis not present

## 2020-12-13 DIAGNOSIS — M79605 Pain in left leg: Secondary | ICD-10-CM | POA: Diagnosis not present

## 2020-12-13 DIAGNOSIS — I429 Cardiomyopathy, unspecified: Secondary | ICD-10-CM | POA: Diagnosis not present

## 2020-12-14 DIAGNOSIS — I5023 Acute on chronic systolic (congestive) heart failure: Secondary | ICD-10-CM | POA: Diagnosis not present

## 2020-12-14 DIAGNOSIS — Z8673 Personal history of transient ischemic attack (TIA), and cerebral infarction without residual deficits: Secondary | ICD-10-CM | POA: Diagnosis not present

## 2020-12-18 DIAGNOSIS — M1990 Unspecified osteoarthritis, unspecified site: Secondary | ICD-10-CM | POA: Diagnosis not present

## 2020-12-18 DIAGNOSIS — K5901 Slow transit constipation: Secondary | ICD-10-CM | POA: Diagnosis not present

## 2020-12-18 DIAGNOSIS — I110 Hypertensive heart disease with heart failure: Secondary | ICD-10-CM | POA: Diagnosis not present

## 2020-12-18 DIAGNOSIS — D539 Nutritional anemia, unspecified: Secondary | ICD-10-CM | POA: Diagnosis not present

## 2020-12-18 DIAGNOSIS — I2510 Atherosclerotic heart disease of native coronary artery without angina pectoris: Secondary | ICD-10-CM | POA: Diagnosis not present

## 2020-12-18 DIAGNOSIS — Z7902 Long term (current) use of antithrombotics/antiplatelets: Secondary | ICD-10-CM | POA: Diagnosis not present

## 2020-12-18 DIAGNOSIS — I69351 Hemiplegia and hemiparesis following cerebral infarction affecting right dominant side: Secondary | ICD-10-CM | POA: Diagnosis not present

## 2020-12-18 DIAGNOSIS — N390 Urinary tract infection, site not specified: Secondary | ICD-10-CM | POA: Diagnosis not present

## 2020-12-18 DIAGNOSIS — D509 Iron deficiency anemia, unspecified: Secondary | ICD-10-CM | POA: Diagnosis not present

## 2020-12-18 DIAGNOSIS — Z7901 Long term (current) use of anticoagulants: Secondary | ICD-10-CM | POA: Diagnosis not present

## 2020-12-18 DIAGNOSIS — K219 Gastro-esophageal reflux disease without esophagitis: Secondary | ICD-10-CM | POA: Diagnosis not present

## 2020-12-18 DIAGNOSIS — H3530 Unspecified macular degeneration: Secondary | ICD-10-CM | POA: Diagnosis not present

## 2020-12-18 DIAGNOSIS — I4891 Unspecified atrial fibrillation: Secondary | ICD-10-CM | POA: Diagnosis not present

## 2020-12-18 DIAGNOSIS — G9341 Metabolic encephalopathy: Secondary | ICD-10-CM | POA: Diagnosis not present

## 2020-12-18 DIAGNOSIS — I5022 Chronic systolic (congestive) heart failure: Secondary | ICD-10-CM | POA: Diagnosis not present

## 2020-12-18 DIAGNOSIS — I429 Cardiomyopathy, unspecified: Secondary | ICD-10-CM | POA: Diagnosis not present

## 2020-12-18 DIAGNOSIS — E46 Unspecified protein-calorie malnutrition: Secondary | ICD-10-CM | POA: Diagnosis not present

## 2020-12-18 DIAGNOSIS — E785 Hyperlipidemia, unspecified: Secondary | ICD-10-CM | POA: Diagnosis not present

## 2020-12-18 NOTE — Progress Notes (Signed)
No ICM remote transmission received for 12/16/2020 and next ICM transmission scheduled for 01/13/2021.

## 2020-12-19 ENCOUNTER — Telehealth: Payer: Self-pay | Admitting: Internal Medicine

## 2020-12-19 NOTE — Telephone Encounter (Signed)
[place] Name: [facility] Agency Name: Moreen Fowler Phone #: [phone] Service Requested: PT Frequency of Visits: 1 week 1 2 week 6 1 week 2  Also needs verification for diagnoses of metabolic encephalopathy

## 2020-12-19 NOTE — Telephone Encounter (Signed)
[name] from [facility] called   Requesting Dr. Sharlet Salina to be authorizing provider for pt. Patient recently moved from facility back home.   Best call back number [phone]

## 2020-12-23 ENCOUNTER — Telehealth: Payer: Self-pay

## 2020-12-23 DIAGNOSIS — G9341 Metabolic encephalopathy: Secondary | ICD-10-CM | POA: Diagnosis not present

## 2020-12-23 DIAGNOSIS — K219 Gastro-esophageal reflux disease without esophagitis: Secondary | ICD-10-CM | POA: Diagnosis not present

## 2020-12-23 DIAGNOSIS — I429 Cardiomyopathy, unspecified: Secondary | ICD-10-CM | POA: Diagnosis not present

## 2020-12-23 DIAGNOSIS — I4891 Unspecified atrial fibrillation: Secondary | ICD-10-CM | POA: Diagnosis not present

## 2020-12-23 DIAGNOSIS — N390 Urinary tract infection, site not specified: Secondary | ICD-10-CM | POA: Diagnosis not present

## 2020-12-23 DIAGNOSIS — I5022 Chronic systolic (congestive) heart failure: Secondary | ICD-10-CM | POA: Diagnosis not present

## 2020-12-23 DIAGNOSIS — Z7901 Long term (current) use of anticoagulants: Secondary | ICD-10-CM | POA: Diagnosis not present

## 2020-12-23 DIAGNOSIS — H3530 Unspecified macular degeneration: Secondary | ICD-10-CM | POA: Diagnosis not present

## 2020-12-23 DIAGNOSIS — D509 Iron deficiency anemia, unspecified: Secondary | ICD-10-CM | POA: Diagnosis not present

## 2020-12-23 DIAGNOSIS — I2510 Atherosclerotic heart disease of native coronary artery without angina pectoris: Secondary | ICD-10-CM | POA: Diagnosis not present

## 2020-12-23 DIAGNOSIS — I69351 Hemiplegia and hemiparesis following cerebral infarction affecting right dominant side: Secondary | ICD-10-CM | POA: Diagnosis not present

## 2020-12-23 DIAGNOSIS — M1990 Unspecified osteoarthritis, unspecified site: Secondary | ICD-10-CM | POA: Diagnosis not present

## 2020-12-23 DIAGNOSIS — K5901 Slow transit constipation: Secondary | ICD-10-CM | POA: Diagnosis not present

## 2020-12-23 DIAGNOSIS — E46 Unspecified protein-calorie malnutrition: Secondary | ICD-10-CM | POA: Diagnosis not present

## 2020-12-23 DIAGNOSIS — E785 Hyperlipidemia, unspecified: Secondary | ICD-10-CM | POA: Diagnosis not present

## 2020-12-23 DIAGNOSIS — I110 Hypertensive heart disease with heart failure: Secondary | ICD-10-CM | POA: Diagnosis not present

## 2020-12-23 DIAGNOSIS — Z7902 Long term (current) use of antithrombotics/antiplatelets: Secondary | ICD-10-CM | POA: Diagnosis not present

## 2020-12-23 DIAGNOSIS — D539 Nutritional anemia, unspecified: Secondary | ICD-10-CM | POA: Diagnosis not present

## 2020-12-23 NOTE — Telephone Encounter (Signed)
[facility] has called and stated she is at the pts home. Pt has been complaining of an upset stomach since Friday and has been retaining fluid. Loose stool and unable to urinate since Friday as well. Pitting edema in both ankles a/w knee swelling. Pt is also having some SOB. Pt has CHF.  Pt weight was 124lbs on Friday and now weighs 129lbs.  BP: 105/64 HR: 70 O2: 99%  Please contact Caryl Pina the pts caregiver at [phone].

## 2020-12-24 ENCOUNTER — Telehealth: Payer: Self-pay

## 2020-12-24 NOTE — Telephone Encounter (Signed)
Patient was in a long term facility and has now went home. [name] from [facility] wanted to make Mark Clayton would still lead his care and that it is ok for [name] to continue to see the patient  [name] [phone] opt 2

## 2020-12-24 NOTE — Telephone Encounter (Signed)
Recommend to go to emergency department.

## 2020-12-25 ENCOUNTER — Ambulatory Visit: Payer: Medicare Other | Admitting: Internal Medicine

## 2020-12-25 NOTE — Telephone Encounter (Signed)
Spoke with Caryl Pina the caregiver and she stated that the patient's symptoms subsided. Pt is feeling much better.

## 2020-12-25 NOTE — Telephone Encounter (Signed)
See previous phone message. 

## 2020-12-25 NOTE — Telephone Encounter (Signed)
[employer]. LVM requesting letting her know that Dr. Sharlet Salina will return on Tuesday 12/31/2020. Office number was provided in case she has additional concerns.

## 2020-12-26 ENCOUNTER — Telehealth: Payer: Self-pay | Admitting: Internal Medicine

## 2020-12-26 NOTE — Telephone Encounter (Signed)
Calling to notify delay in speech therapy  Patient family called & cancelled appt 09.28.22 & nurse will not be able to see patient until 10.03.22

## 2020-12-26 NOTE — Telephone Encounter (Signed)
Noted  

## 2020-12-27 DIAGNOSIS — I4891 Unspecified atrial fibrillation: Secondary | ICD-10-CM | POA: Diagnosis not present

## 2020-12-27 DIAGNOSIS — I69351 Hemiplegia and hemiparesis following cerebral infarction affecting right dominant side: Secondary | ICD-10-CM | POA: Diagnosis not present

## 2020-12-27 DIAGNOSIS — H3530 Unspecified macular degeneration: Secondary | ICD-10-CM | POA: Diagnosis not present

## 2020-12-27 DIAGNOSIS — E46 Unspecified protein-calorie malnutrition: Secondary | ICD-10-CM | POA: Diagnosis not present

## 2020-12-27 DIAGNOSIS — D509 Iron deficiency anemia, unspecified: Secondary | ICD-10-CM | POA: Diagnosis not present

## 2020-12-27 DIAGNOSIS — K219 Gastro-esophageal reflux disease without esophagitis: Secondary | ICD-10-CM | POA: Diagnosis not present

## 2020-12-27 DIAGNOSIS — I2510 Atherosclerotic heart disease of native coronary artery without angina pectoris: Secondary | ICD-10-CM | POA: Diagnosis not present

## 2020-12-27 DIAGNOSIS — N390 Urinary tract infection, site not specified: Secondary | ICD-10-CM | POA: Diagnosis not present

## 2020-12-27 DIAGNOSIS — G9341 Metabolic encephalopathy: Secondary | ICD-10-CM | POA: Diagnosis not present

## 2020-12-27 DIAGNOSIS — Z7901 Long term (current) use of anticoagulants: Secondary | ICD-10-CM | POA: Diagnosis not present

## 2020-12-27 DIAGNOSIS — I429 Cardiomyopathy, unspecified: Secondary | ICD-10-CM | POA: Diagnosis not present

## 2020-12-27 DIAGNOSIS — I110 Hypertensive heart disease with heart failure: Secondary | ICD-10-CM | POA: Diagnosis not present

## 2020-12-27 DIAGNOSIS — I5022 Chronic systolic (congestive) heart failure: Secondary | ICD-10-CM | POA: Diagnosis not present

## 2020-12-27 DIAGNOSIS — Z7902 Long term (current) use of antithrombotics/antiplatelets: Secondary | ICD-10-CM | POA: Diagnosis not present

## 2020-12-27 DIAGNOSIS — K5901 Slow transit constipation: Secondary | ICD-10-CM | POA: Diagnosis not present

## 2020-12-27 DIAGNOSIS — M1990 Unspecified osteoarthritis, unspecified site: Secondary | ICD-10-CM | POA: Diagnosis not present

## 2020-12-27 DIAGNOSIS — D539 Nutritional anemia, unspecified: Secondary | ICD-10-CM | POA: Diagnosis not present

## 2020-12-27 DIAGNOSIS — E785 Hyperlipidemia, unspecified: Secondary | ICD-10-CM | POA: Diagnosis not present

## 2020-12-30 ENCOUNTER — Other Ambulatory Visit: Payer: Self-pay | Admitting: Internal Medicine

## 2020-12-30 DIAGNOSIS — N390 Urinary tract infection, site not specified: Secondary | ICD-10-CM | POA: Diagnosis not present

## 2020-12-30 DIAGNOSIS — Z7902 Long term (current) use of antithrombotics/antiplatelets: Secondary | ICD-10-CM | POA: Diagnosis not present

## 2020-12-30 DIAGNOSIS — E785 Hyperlipidemia, unspecified: Secondary | ICD-10-CM | POA: Diagnosis not present

## 2020-12-30 DIAGNOSIS — K219 Gastro-esophageal reflux disease without esophagitis: Secondary | ICD-10-CM | POA: Diagnosis not present

## 2020-12-30 DIAGNOSIS — E46 Unspecified protein-calorie malnutrition: Secondary | ICD-10-CM | POA: Diagnosis not present

## 2020-12-30 DIAGNOSIS — I69351 Hemiplegia and hemiparesis following cerebral infarction affecting right dominant side: Secondary | ICD-10-CM | POA: Diagnosis not present

## 2020-12-30 DIAGNOSIS — Z7901 Long term (current) use of anticoagulants: Secondary | ICD-10-CM | POA: Diagnosis not present

## 2020-12-30 DIAGNOSIS — I2510 Atherosclerotic heart disease of native coronary artery without angina pectoris: Secondary | ICD-10-CM | POA: Diagnosis not present

## 2020-12-30 DIAGNOSIS — M1990 Unspecified osteoarthritis, unspecified site: Secondary | ICD-10-CM | POA: Diagnosis not present

## 2020-12-30 DIAGNOSIS — I110 Hypertensive heart disease with heart failure: Secondary | ICD-10-CM | POA: Diagnosis not present

## 2020-12-30 DIAGNOSIS — K5901 Slow transit constipation: Secondary | ICD-10-CM | POA: Diagnosis not present

## 2020-12-30 DIAGNOSIS — I4891 Unspecified atrial fibrillation: Secondary | ICD-10-CM | POA: Diagnosis not present

## 2020-12-30 DIAGNOSIS — H3530 Unspecified macular degeneration: Secondary | ICD-10-CM | POA: Diagnosis not present

## 2020-12-30 DIAGNOSIS — D539 Nutritional anemia, unspecified: Secondary | ICD-10-CM | POA: Diagnosis not present

## 2020-12-30 DIAGNOSIS — D509 Iron deficiency anemia, unspecified: Secondary | ICD-10-CM | POA: Diagnosis not present

## 2020-12-30 DIAGNOSIS — I429 Cardiomyopathy, unspecified: Secondary | ICD-10-CM | POA: Diagnosis not present

## 2020-12-30 DIAGNOSIS — I5022 Chronic systolic (congestive) heart failure: Secondary | ICD-10-CM | POA: Diagnosis not present

## 2020-12-30 DIAGNOSIS — G9341 Metabolic encephalopathy: Secondary | ICD-10-CM | POA: Diagnosis not present

## 2020-12-31 ENCOUNTER — Telehealth: Payer: Self-pay | Admitting: Internal Medicine

## 2020-12-31 DIAGNOSIS — I429 Cardiomyopathy, unspecified: Secondary | ICD-10-CM | POA: Diagnosis not present

## 2020-12-31 DIAGNOSIS — D539 Nutritional anemia, unspecified: Secondary | ICD-10-CM | POA: Diagnosis not present

## 2020-12-31 DIAGNOSIS — I69351 Hemiplegia and hemiparesis following cerebral infarction affecting right dominant side: Secondary | ICD-10-CM | POA: Diagnosis not present

## 2020-12-31 DIAGNOSIS — I2510 Atherosclerotic heart disease of native coronary artery without angina pectoris: Secondary | ICD-10-CM | POA: Diagnosis not present

## 2020-12-31 DIAGNOSIS — K219 Gastro-esophageal reflux disease without esophagitis: Secondary | ICD-10-CM | POA: Diagnosis not present

## 2020-12-31 DIAGNOSIS — E785 Hyperlipidemia, unspecified: Secondary | ICD-10-CM | POA: Diagnosis not present

## 2020-12-31 DIAGNOSIS — M1990 Unspecified osteoarthritis, unspecified site: Secondary | ICD-10-CM | POA: Diagnosis not present

## 2020-12-31 DIAGNOSIS — I5022 Chronic systolic (congestive) heart failure: Secondary | ICD-10-CM | POA: Diagnosis not present

## 2020-12-31 DIAGNOSIS — D509 Iron deficiency anemia, unspecified: Secondary | ICD-10-CM | POA: Diagnosis not present

## 2020-12-31 DIAGNOSIS — H3530 Unspecified macular degeneration: Secondary | ICD-10-CM | POA: Diagnosis not present

## 2020-12-31 DIAGNOSIS — E46 Unspecified protein-calorie malnutrition: Secondary | ICD-10-CM | POA: Diagnosis not present

## 2020-12-31 DIAGNOSIS — Z7901 Long term (current) use of anticoagulants: Secondary | ICD-10-CM | POA: Diagnosis not present

## 2020-12-31 DIAGNOSIS — K5901 Slow transit constipation: Secondary | ICD-10-CM | POA: Diagnosis not present

## 2020-12-31 DIAGNOSIS — Z7902 Long term (current) use of antithrombotics/antiplatelets: Secondary | ICD-10-CM | POA: Diagnosis not present

## 2020-12-31 DIAGNOSIS — I110 Hypertensive heart disease with heart failure: Secondary | ICD-10-CM | POA: Diagnosis not present

## 2020-12-31 DIAGNOSIS — G9341 Metabolic encephalopathy: Secondary | ICD-10-CM | POA: Diagnosis not present

## 2020-12-31 DIAGNOSIS — N390 Urinary tract infection, site not specified: Secondary | ICD-10-CM | POA: Diagnosis not present

## 2020-12-31 DIAGNOSIS — I4891 Unspecified atrial fibrillation: Secondary | ICD-10-CM | POA: Diagnosis not present

## 2020-12-31 NOTE — Telephone Encounter (Signed)
[place] Name: [facility] Agency Name: Moreen Fowler Phone #: [phone] Service Requested: speech therapy eval (examples: OT/PT/Skilled Nursing/Social Work/Speech Therapy/Wound Care) Frequency of Visits: n/a

## 2021-01-01 NOTE — Telephone Encounter (Signed)
Fine for verbals °

## 2021-01-01 NOTE — Telephone Encounter (Signed)
Unable to get in contact with Anna Genre for verbal orders. VM not setup to leave verbal orders.

## 2021-01-01 NOTE — Telephone Encounter (Signed)
See below

## 2021-01-02 DIAGNOSIS — Z7902 Long term (current) use of antithrombotics/antiplatelets: Secondary | ICD-10-CM | POA: Diagnosis not present

## 2021-01-02 DIAGNOSIS — I69351 Hemiplegia and hemiparesis following cerebral infarction affecting right dominant side: Secondary | ICD-10-CM | POA: Diagnosis not present

## 2021-01-02 DIAGNOSIS — E46 Unspecified protein-calorie malnutrition: Secondary | ICD-10-CM | POA: Diagnosis not present

## 2021-01-02 DIAGNOSIS — H3530 Unspecified macular degeneration: Secondary | ICD-10-CM | POA: Diagnosis not present

## 2021-01-02 DIAGNOSIS — E785 Hyperlipidemia, unspecified: Secondary | ICD-10-CM | POA: Diagnosis not present

## 2021-01-02 DIAGNOSIS — I5022 Chronic systolic (congestive) heart failure: Secondary | ICD-10-CM | POA: Diagnosis not present

## 2021-01-02 DIAGNOSIS — Z7901 Long term (current) use of anticoagulants: Secondary | ICD-10-CM | POA: Diagnosis not present

## 2021-01-02 DIAGNOSIS — I4891 Unspecified atrial fibrillation: Secondary | ICD-10-CM | POA: Diagnosis not present

## 2021-01-02 DIAGNOSIS — K5901 Slow transit constipation: Secondary | ICD-10-CM | POA: Diagnosis not present

## 2021-01-02 DIAGNOSIS — I110 Hypertensive heart disease with heart failure: Secondary | ICD-10-CM | POA: Diagnosis not present

## 2021-01-02 DIAGNOSIS — I429 Cardiomyopathy, unspecified: Secondary | ICD-10-CM | POA: Diagnosis not present

## 2021-01-02 DIAGNOSIS — D509 Iron deficiency anemia, unspecified: Secondary | ICD-10-CM | POA: Diagnosis not present

## 2021-01-02 DIAGNOSIS — K219 Gastro-esophageal reflux disease without esophagitis: Secondary | ICD-10-CM | POA: Diagnosis not present

## 2021-01-02 DIAGNOSIS — N390 Urinary tract infection, site not specified: Secondary | ICD-10-CM | POA: Diagnosis not present

## 2021-01-02 DIAGNOSIS — D539 Nutritional anemia, unspecified: Secondary | ICD-10-CM | POA: Diagnosis not present

## 2021-01-02 DIAGNOSIS — I2510 Atherosclerotic heart disease of native coronary artery without angina pectoris: Secondary | ICD-10-CM | POA: Diagnosis not present

## 2021-01-02 DIAGNOSIS — M1990 Unspecified osteoarthritis, unspecified site: Secondary | ICD-10-CM | POA: Diagnosis not present

## 2021-01-02 DIAGNOSIS — G9341 Metabolic encephalopathy: Secondary | ICD-10-CM | POA: Diagnosis not present

## 2021-01-04 ENCOUNTER — Other Ambulatory Visit: Payer: Self-pay | Admitting: Cardiology

## 2021-01-06 ENCOUNTER — Telehealth: Payer: Self-pay | Admitting: Internal Medicine

## 2021-01-06 DIAGNOSIS — Z7901 Long term (current) use of anticoagulants: Secondary | ICD-10-CM | POA: Diagnosis not present

## 2021-01-06 DIAGNOSIS — D539 Nutritional anemia, unspecified: Secondary | ICD-10-CM | POA: Diagnosis not present

## 2021-01-06 DIAGNOSIS — I69351 Hemiplegia and hemiparesis following cerebral infarction affecting right dominant side: Secondary | ICD-10-CM | POA: Diagnosis not present

## 2021-01-06 DIAGNOSIS — E46 Unspecified protein-calorie malnutrition: Secondary | ICD-10-CM | POA: Diagnosis not present

## 2021-01-06 DIAGNOSIS — I4891 Unspecified atrial fibrillation: Secondary | ICD-10-CM | POA: Diagnosis not present

## 2021-01-06 DIAGNOSIS — H3530 Unspecified macular degeneration: Secondary | ICD-10-CM | POA: Diagnosis not present

## 2021-01-06 DIAGNOSIS — G9341 Metabolic encephalopathy: Secondary | ICD-10-CM | POA: Diagnosis not present

## 2021-01-06 DIAGNOSIS — I110 Hypertensive heart disease with heart failure: Secondary | ICD-10-CM | POA: Diagnosis not present

## 2021-01-06 DIAGNOSIS — K219 Gastro-esophageal reflux disease without esophagitis: Secondary | ICD-10-CM | POA: Diagnosis not present

## 2021-01-06 DIAGNOSIS — M1990 Unspecified osteoarthritis, unspecified site: Secondary | ICD-10-CM | POA: Diagnosis not present

## 2021-01-06 DIAGNOSIS — D509 Iron deficiency anemia, unspecified: Secondary | ICD-10-CM | POA: Diagnosis not present

## 2021-01-06 DIAGNOSIS — K5901 Slow transit constipation: Secondary | ICD-10-CM | POA: Diagnosis not present

## 2021-01-06 DIAGNOSIS — E785 Hyperlipidemia, unspecified: Secondary | ICD-10-CM | POA: Diagnosis not present

## 2021-01-06 DIAGNOSIS — I5022 Chronic systolic (congestive) heart failure: Secondary | ICD-10-CM | POA: Diagnosis not present

## 2021-01-06 DIAGNOSIS — I429 Cardiomyopathy, unspecified: Secondary | ICD-10-CM | POA: Diagnosis not present

## 2021-01-06 DIAGNOSIS — N390 Urinary tract infection, site not specified: Secondary | ICD-10-CM | POA: Diagnosis not present

## 2021-01-06 DIAGNOSIS — I2510 Atherosclerotic heart disease of native coronary artery without angina pectoris: Secondary | ICD-10-CM | POA: Diagnosis not present

## 2021-01-06 DIAGNOSIS — Z7902 Long term (current) use of antithrombotics/antiplatelets: Secondary | ICD-10-CM | POA: Diagnosis not present

## 2021-01-06 NOTE — Telephone Encounter (Signed)
Verbals fine

## 2021-01-06 NOTE — Telephone Encounter (Signed)
See below

## 2021-01-06 NOTE — Telephone Encounter (Signed)
[name] from [employer] called for verbals for palliative care.   Best contact #: [phone]

## 2021-01-08 NOTE — Telephone Encounter (Signed)
LVM with verbal orders.  

## 2021-01-09 DIAGNOSIS — K5901 Slow transit constipation: Secondary | ICD-10-CM | POA: Diagnosis not present

## 2021-01-09 DIAGNOSIS — Z7901 Long term (current) use of anticoagulants: Secondary | ICD-10-CM | POA: Diagnosis not present

## 2021-01-09 DIAGNOSIS — E785 Hyperlipidemia, unspecified: Secondary | ICD-10-CM | POA: Diagnosis not present

## 2021-01-09 DIAGNOSIS — H3530 Unspecified macular degeneration: Secondary | ICD-10-CM | POA: Diagnosis not present

## 2021-01-09 DIAGNOSIS — D539 Nutritional anemia, unspecified: Secondary | ICD-10-CM | POA: Diagnosis not present

## 2021-01-09 DIAGNOSIS — I4891 Unspecified atrial fibrillation: Secondary | ICD-10-CM | POA: Diagnosis not present

## 2021-01-09 DIAGNOSIS — G9341 Metabolic encephalopathy: Secondary | ICD-10-CM | POA: Diagnosis not present

## 2021-01-09 DIAGNOSIS — I429 Cardiomyopathy, unspecified: Secondary | ICD-10-CM | POA: Diagnosis not present

## 2021-01-09 DIAGNOSIS — I2510 Atherosclerotic heart disease of native coronary artery without angina pectoris: Secondary | ICD-10-CM | POA: Diagnosis not present

## 2021-01-09 DIAGNOSIS — Z7902 Long term (current) use of antithrombotics/antiplatelets: Secondary | ICD-10-CM | POA: Diagnosis not present

## 2021-01-09 DIAGNOSIS — M1990 Unspecified osteoarthritis, unspecified site: Secondary | ICD-10-CM | POA: Diagnosis not present

## 2021-01-09 DIAGNOSIS — E46 Unspecified protein-calorie malnutrition: Secondary | ICD-10-CM | POA: Diagnosis not present

## 2021-01-09 DIAGNOSIS — I5022 Chronic systolic (congestive) heart failure: Secondary | ICD-10-CM | POA: Diagnosis not present

## 2021-01-09 DIAGNOSIS — K219 Gastro-esophageal reflux disease without esophagitis: Secondary | ICD-10-CM | POA: Diagnosis not present

## 2021-01-09 DIAGNOSIS — I69351 Hemiplegia and hemiparesis following cerebral infarction affecting right dominant side: Secondary | ICD-10-CM | POA: Diagnosis not present

## 2021-01-09 DIAGNOSIS — D509 Iron deficiency anemia, unspecified: Secondary | ICD-10-CM | POA: Diagnosis not present

## 2021-01-09 DIAGNOSIS — N390 Urinary tract infection, site not specified: Secondary | ICD-10-CM | POA: Diagnosis not present

## 2021-01-09 DIAGNOSIS — I110 Hypertensive heart disease with heart failure: Secondary | ICD-10-CM | POA: Diagnosis not present

## 2021-01-10 ENCOUNTER — Other Ambulatory Visit: Payer: Self-pay | Admitting: Neurology

## 2021-01-13 ENCOUNTER — Other Ambulatory Visit: Payer: Self-pay | Admitting: Neurology

## 2021-01-13 ENCOUNTER — Other Ambulatory Visit: Payer: Self-pay

## 2021-01-13 DIAGNOSIS — I5023 Acute on chronic systolic (congestive) heart failure: Secondary | ICD-10-CM | POA: Diagnosis not present

## 2021-01-13 DIAGNOSIS — Z8673 Personal history of transient ischemic attack (TIA), and cerebral infarction without residual deficits: Secondary | ICD-10-CM | POA: Diagnosis not present

## 2021-01-13 MED ORDER — DIVALPROEX SODIUM ER 250 MG PO TB24: ORAL_TABLET | ORAL | 11 refills | Status: DC

## 2021-01-14 DIAGNOSIS — Z7902 Long term (current) use of antithrombotics/antiplatelets: Secondary | ICD-10-CM | POA: Diagnosis not present

## 2021-01-14 DIAGNOSIS — I4891 Unspecified atrial fibrillation: Secondary | ICD-10-CM | POA: Diagnosis not present

## 2021-01-14 DIAGNOSIS — H3530 Unspecified macular degeneration: Secondary | ICD-10-CM | POA: Diagnosis not present

## 2021-01-14 DIAGNOSIS — I2510 Atherosclerotic heart disease of native coronary artery without angina pectoris: Secondary | ICD-10-CM | POA: Diagnosis not present

## 2021-01-14 DIAGNOSIS — K219 Gastro-esophageal reflux disease without esophagitis: Secondary | ICD-10-CM | POA: Diagnosis not present

## 2021-01-14 DIAGNOSIS — N390 Urinary tract infection, site not specified: Secondary | ICD-10-CM | POA: Diagnosis not present

## 2021-01-14 DIAGNOSIS — E46 Unspecified protein-calorie malnutrition: Secondary | ICD-10-CM | POA: Diagnosis not present

## 2021-01-14 DIAGNOSIS — D509 Iron deficiency anemia, unspecified: Secondary | ICD-10-CM | POA: Diagnosis not present

## 2021-01-14 DIAGNOSIS — I5022 Chronic systolic (congestive) heart failure: Secondary | ICD-10-CM | POA: Diagnosis not present

## 2021-01-14 DIAGNOSIS — K5901 Slow transit constipation: Secondary | ICD-10-CM | POA: Diagnosis not present

## 2021-01-14 DIAGNOSIS — M1990 Unspecified osteoarthritis, unspecified site: Secondary | ICD-10-CM | POA: Diagnosis not present

## 2021-01-14 DIAGNOSIS — I429 Cardiomyopathy, unspecified: Secondary | ICD-10-CM | POA: Diagnosis not present

## 2021-01-14 DIAGNOSIS — Z7901 Long term (current) use of anticoagulants: Secondary | ICD-10-CM | POA: Diagnosis not present

## 2021-01-14 DIAGNOSIS — D539 Nutritional anemia, unspecified: Secondary | ICD-10-CM | POA: Diagnosis not present

## 2021-01-14 DIAGNOSIS — E785 Hyperlipidemia, unspecified: Secondary | ICD-10-CM | POA: Diagnosis not present

## 2021-01-14 DIAGNOSIS — I110 Hypertensive heart disease with heart failure: Secondary | ICD-10-CM | POA: Diagnosis not present

## 2021-01-14 DIAGNOSIS — I69351 Hemiplegia and hemiparesis following cerebral infarction affecting right dominant side: Secondary | ICD-10-CM | POA: Diagnosis not present

## 2021-01-14 DIAGNOSIS — G9341 Metabolic encephalopathy: Secondary | ICD-10-CM | POA: Diagnosis not present

## 2021-01-16 ENCOUNTER — Telehealth: Payer: Self-pay

## 2021-01-16 NOTE — Telephone Encounter (Signed)
I spoke with the patient and his granddaughter about missed transmission. They agreed to send one in the morning.

## 2021-01-17 ENCOUNTER — Telehealth: Payer: Self-pay

## 2021-01-17 DIAGNOSIS — I429 Cardiomyopathy, unspecified: Secondary | ICD-10-CM | POA: Diagnosis not present

## 2021-01-17 DIAGNOSIS — Z7901 Long term (current) use of anticoagulants: Secondary | ICD-10-CM | POA: Diagnosis not present

## 2021-01-17 DIAGNOSIS — G9341 Metabolic encephalopathy: Secondary | ICD-10-CM | POA: Diagnosis not present

## 2021-01-17 DIAGNOSIS — K219 Gastro-esophageal reflux disease without esophagitis: Secondary | ICD-10-CM | POA: Diagnosis not present

## 2021-01-17 DIAGNOSIS — D539 Nutritional anemia, unspecified: Secondary | ICD-10-CM | POA: Diagnosis not present

## 2021-01-17 DIAGNOSIS — E46 Unspecified protein-calorie malnutrition: Secondary | ICD-10-CM | POA: Diagnosis not present

## 2021-01-17 DIAGNOSIS — I5022 Chronic systolic (congestive) heart failure: Secondary | ICD-10-CM | POA: Diagnosis not present

## 2021-01-17 DIAGNOSIS — N390 Urinary tract infection, site not specified: Secondary | ICD-10-CM | POA: Diagnosis not present

## 2021-01-17 DIAGNOSIS — Z7902 Long term (current) use of antithrombotics/antiplatelets: Secondary | ICD-10-CM | POA: Diagnosis not present

## 2021-01-17 DIAGNOSIS — D509 Iron deficiency anemia, unspecified: Secondary | ICD-10-CM | POA: Diagnosis not present

## 2021-01-17 DIAGNOSIS — I2510 Atherosclerotic heart disease of native coronary artery without angina pectoris: Secondary | ICD-10-CM | POA: Diagnosis not present

## 2021-01-17 DIAGNOSIS — I4891 Unspecified atrial fibrillation: Secondary | ICD-10-CM | POA: Diagnosis not present

## 2021-01-17 DIAGNOSIS — H3530 Unspecified macular degeneration: Secondary | ICD-10-CM | POA: Diagnosis not present

## 2021-01-17 DIAGNOSIS — I110 Hypertensive heart disease with heart failure: Secondary | ICD-10-CM | POA: Diagnosis not present

## 2021-01-17 DIAGNOSIS — K5901 Slow transit constipation: Secondary | ICD-10-CM | POA: Diagnosis not present

## 2021-01-17 DIAGNOSIS — E785 Hyperlipidemia, unspecified: Secondary | ICD-10-CM | POA: Diagnosis not present

## 2021-01-17 DIAGNOSIS — I69351 Hemiplegia and hemiparesis following cerebral infarction affecting right dominant side: Secondary | ICD-10-CM | POA: Diagnosis not present

## 2021-01-17 DIAGNOSIS — M1990 Unspecified osteoarthritis, unspecified site: Secondary | ICD-10-CM | POA: Diagnosis not present

## 2021-01-17 NOTE — Telephone Encounter (Signed)
Returned call to patient as requested.  He asked if he should send remote transmission on 01/20/2021 and advised yes to review fluid levels.  Advised not receiving automatic transmissions and he said the monitor was moved to 20 feet away.  Advised to move monitor beside bed where he is sleeping in order to receive auto transmission.  His caretaker moved during conversation.  He will send manual transmission on 10/24 for fluid level review.

## 2021-01-20 ENCOUNTER — Other Ambulatory Visit: Payer: Self-pay

## 2021-01-20 ENCOUNTER — Encounter: Payer: Self-pay | Admitting: Neurology

## 2021-01-20 ENCOUNTER — Telehealth: Payer: Self-pay

## 2021-01-20 ENCOUNTER — Ambulatory Visit (INDEPENDENT_AMBULATORY_CARE_PROVIDER_SITE_OTHER): Payer: Medicare Other | Admitting: Neurology

## 2021-01-20 ENCOUNTER — Ambulatory Visit (INDEPENDENT_AMBULATORY_CARE_PROVIDER_SITE_OTHER): Payer: Medicare Other

## 2021-01-20 VITALS — BP 92/63 | HR 83 | Resp 18 | Ht 71.0 in

## 2021-01-20 DIAGNOSIS — I5022 Chronic systolic (congestive) heart failure: Secondary | ICD-10-CM | POA: Diagnosis not present

## 2021-01-20 DIAGNOSIS — F32A Depression, unspecified: Secondary | ICD-10-CM

## 2021-01-20 DIAGNOSIS — Z95810 Presence of automatic (implantable) cardiac defibrillator: Secondary | ICD-10-CM | POA: Diagnosis not present

## 2021-01-20 DIAGNOSIS — R404 Transient alteration of awareness: Secondary | ICD-10-CM | POA: Diagnosis not present

## 2021-01-20 DIAGNOSIS — F419 Anxiety disorder, unspecified: Secondary | ICD-10-CM | POA: Diagnosis not present

## 2021-01-20 DIAGNOSIS — G4700 Insomnia, unspecified: Secondary | ICD-10-CM | POA: Diagnosis not present

## 2021-01-20 DIAGNOSIS — R441 Visual hallucinations: Secondary | ICD-10-CM | POA: Diagnosis not present

## 2021-01-20 MED ORDER — DIVALPROEX SODIUM ER 250 MG PO TB24: ORAL_TABLET | ORAL | 11 refills | Status: DC

## 2021-01-20 MED ORDER — TRAZODONE HCL 50 MG PO TABS: ORAL_TABLET | ORAL | 11 refills | Status: DC

## 2021-01-20 NOTE — Progress Notes (Signed)
EPIC Encounter for ICM Monitoring  Patient Name: Mark Clayton is a 80 y.o. male Date: 01/20/2021 Primary Care Physican: [name], MD Cardiologist: Marlou Porch Electrophysiologist: Caryl Comes 01/21/2021 Weight: 124.6 lbs   Time in AF:  24.0 hr/day (100.0%) (taking Xarelto)          Spoke with patient and heart failure questions reviewed.  Pt reports elevating feet to help with lower leg swelling.   He is not weighing at home on consistent basis.  He reports eating foods such as sausage, bacon and other foods that may be high in salt.         Optivol Thoracic impedance suggesting possible ongoing fluid accumulation since 12/04/2020.   Prescribed:  Furosemide 40 mg 1 tablet by mouth daily. Potassium 10 mEq take 2 tablets (20 mEq total) daily   Labs: 11/20/2020 Creatinine 1.30, BUN 18, Potassium 5.0, Sodium 136, GFR 56 11/19/2020 Creatinine 1.30, BUN 18, Potassium 4.6, Sodium 136, GFR 56  11/18/2020 Creatinine 1.25, BUN 20, Potassium 4.4, Sodium 138, GFR 59  11/17/2020 Creatinine 1.43, BUN 21, Potassium 3.3, Sodium 136, GFR 50  11/16/2020 Creatinine 1.47, BUN 26, Potassium 3.6, Sodium 135, GFR 48  11/15/2020 Creatinine 1.35, BUN 30, Potassium 4.0, Sodium 138, GFR 53  11/14/2020 Creatinine 1.35, BUN 33, Potassium 4.2, Sodium 139, GFR 53  11/13/2020 Creatinine 1.14, BUN 31, Potassium 3.9, Sodium 136, GFR >60  A complete set of results can be found in Results Review.   Recommendations:  Confirmed he is taking Furosemide 40 mg daily.   Advised to limit salt intake 2000 mg daily and fluid intake to 64 oz daily.  Will send copy to Dr Marlou Porch for review.    Follow-up plan: ICM clinic phone appointment on 01/27/2021 to recheck fluid levels.  91 day device clinic remote transmission 01/22/2021.     EP/Cardiology Office Visits: Recall 01/26/2021 with Dr Marlou Porch.  Recall for 04/03/2021 with Oda Kilts, PA.     Copy of ICM check sent to Dr. Caryl Comes and Dr Marlou Porch for review and recommendations if  needed.  3 month ICM trend: 01/20/2021.    1 Year ICM trend:       Rosalene Billings, RN 01/20/2021 12:14 PM

## 2021-01-20 NOTE — Progress Notes (Signed)
NEUROLOGY FOLLOW UP OFFICE NOTE  Mark Clayton [id number] March 21, 1941  HISTORY OF PRESENT ILLNESS: I had the pleasure of seeing Mark Clayton in follow-up in the neurology clinic on 01/20/2021.  The patient was last seen 2 months ago for recurrent staring episodes suggestive of focal seizures with impaired awareness, likely arising from prior stroke. He is again accompanied by his daughter Mark Clayton and caregiver [name] who help supplement the history today.  Records and images were personally reviewed where available.  On his last visit, he was started on Depakote ER 500mg  qhs. Mark Clayton and Mark Clayton deny any staring unresponsive episodes in the past 2 months. He is still really anxious and irritable, sometimes horrified and does not want to be with anyone, or wanting someone with him all the time. He is anxious about going out of the house. He continues to report visual hallucinations. He sees a lot of mice and reports seeing a mouse in the office today. They are not scary for him, "all peaceful." He has a hospital bed but cannot get himself comfortable, he denies any pain. Appetite is okay. He has vision problems, he states he has difficulty seeing my face but can see a speck of dirt on the floor. He cannot read letters in front of him. He has problems remembering names and saying the wrong words for names. Mark Clayton stays with him during the day and his granddaughter [name] stays with him at night. He uses the walker to go to the bathroom, no recent falls.  EKG in 10/2020 showed prolonged QTc of 531.    History on Initial Assessment 10/11/2020: This is a 80 year old right-handed man with a history of atrial fibrillation on Xarelto, s/p ICD placement, hypertension, colon cancer, left MCA stroke with traumatic left vertex SDH in March 2021 with residual right hemiparesis presenting for evaluation of possible seizures. His granddaughter Mark Clayton lives with him. After the stroke in 05/2019, [facility] recalls that he had  episodes of zoning out/unresponsive for 30 seconds to a couple of minutes that stopped for a while, then recurred, last episode was 3-4 weeks ago. There is no prior warning, he is amnestic of events. There would be a quick jerk after the staring. Lately his right arm feels like there is electricity. His right leg is numb, sometimes it hurts/aches. He has a walker at home. He had a fall a few months ago and since then started having visual hallucinations that are non-threatening to him. He says "I see things," he sees people, animals, figures, things on the floor. No auditory component. He says he sees them all the time, people dancing in the mirrors, animals playing outside. He blinks and they would be gone. One time he wanted to talk to a cop next to [place]. He was started on Remeron in May 2022. He gets disoriented mostly at night, but this occurs in the daytime as well. He says the pictures on the walls change, he points to a painting of boats in the office, he says it changes into a house when he closes his eyes. He wakes up and there is a TV show going on, but he does not have a TV in his room. [name] notes that anxiety also started at that time. He gets panic attacks and sometimes may have the zoning out, occurring every couple of weeks. He may grip his knees but no clear dystonia or automatisms. It takes him about 1.5 minutes to recognize her. No associated tongue bite or incontinence.  A few nights ago he woke up and did not remember where he was or [name]'s name. Mark Clayton has lived with him since she was born, he raised her. She started helping with medications a year ago. She states it was not due to memory problems but due to needing to get things together physically. He has started having problems remembering names. She took over finances a year ago. He does not drive. He gets agitated a lot because he used to be very independent and gets angry about it. He denies any headaches, dysarthria/dysphagia,  focal numbness/tingling/weakness, myoclonic jerks. He get dizzy and takes meclizine for vertigo, but also has periods where he feels lightheaded/faint. This has gotten worse recently as well. No loss of consciousness. With macular degeneration, he can count fingers. He has neck pain and some constipation. He had a fall when he stood up and his foot caught on the wheelchair, bumping his head a little.   I personally reviewed imaging from 05/2019, last head CT showed resolution of ICH including SAH and SDH. Head CT showed left parietal infarct.     Lab Results  Component Value Date   TSH 10.380 (H) 11/14/2020   Lab Results  Component Value Date   VITAMINB12 1,455 (H) 11/14/2020    PAST MEDICAL HISTORY: Past Medical History:  Diagnosis Date   AICD (automatic cardioverter/defibrillator) present 01/17/2003   Medtronic Maximo 7232CX ICD, serial #[id number]   Anemia 02-06-11   takes oral iron   Arthritis    hands, knees   CAD (coronary artery disease) 2003   a. h/o MI and CABG in 2003. b. s/p DES to SVG-RPDA-RPLB in 08/2014.   Cancer of sigmoid colon ([place]) 2012   a. s/p colon surgery.   Carotid bruit    Chronic systolic CHF (congestive heart failure) ([place])    a. EF 20% in 2014; b. 08/2017 Echo: EF 20-25%, diff HK, Gr3 DD. Triv AI. Mod MR. Sev dil LA. Mildly dil RV w/ mildly reduced RV fxn. Mildly dil RA. Mod TR. PASP 22mHg.   Cough    Exudative age-related macular degeneration of left eye with active choroidal neovascularization ([place]) 07/12/2019   Exudative age-related macular degeneration, right eye, with inactive choroidal neovascularization ([employer]) 07/12/2019   GERD (gastroesophageal reflux disease) 02-06-11   HTN (hypertension)    Hyperlipidemia    Ischemic cardiomyopathy    a. EF 20% in 2014. (Master study EF >20%); b. 08/2017 Echo: EF 20-25%, diff HK. Gr3 DD.   LV (left ventricular) mural thrombus    a. 12/2012 Echo: EF 20% with mural thrombus No evidence of thrombus on 08/2017 echo.    Macular degeneration    Myocardial infarct ([place])    2003   PAF (paroxysmal atrial fibrillation) ([facility])    a. CHA2DS2VASc = 5-->Xarelto/Tikosyn.   Ventricular tachycardia 11/05/2020    MEDICATIONS: Current Outpatient Medications on File Prior to Visit  Medication Sig Dispense Refill   atorvastatin (LIPITOR) 80 MG tablet TAKE 1 TABLET BY MOUTH  DAILY (Patient taking differently: Take 80 mg by mouth daily.) 90 tablet 3   baclofen (LIORESAL) 10 MG tablet Take 1 tablet (10 mg total) by mouth 2 (two) times daily as needed for muscle spasms. 60 each 2   carvedilol (COREG) 3.125 MG tablet Take 1 tablet (3.125 mg total) by mouth 2 (two) times daily with a meal.     divalproex (DEPAKOTE ER) 250 MG 24 hr tablet Take 2 tablets every night 60 tablet 11   ferrous sulfate 325 (  65 FE) MG tablet Take 1 tablet (325 mg total) by mouth 2 (two) times daily with a meal.  3   furosemide (LASIX) 20 MG tablet Take 10 mg by mouth. Take a 1/2 tab with the 5m equal 50 mg     furosemide (LASIX) 40 MG tablet Take 1.5 tablets (60 mg total) by mouth 2 (two) times daily. (Patient taking differently: Take 40 mg by mouth daily.) 270 tablet 3   meclizine (ANTIVERT) 25 MG tablet Take 25 mg by mouth 3 (three) times daily as needed for dizziness. 1/2 tablet every 14 days for dizziness     nitroGLYCERIN (NITROSTAT) 0.4 MG SL tablet DISSOLVE 1 TABLET UNDER THE TONGUE EVERY 5 MINUTES AS  NEEDED FOR CHEST PAIN. MAX  OF 3 TABLETS IN 15 MINUTES. CALL 911 IF PAIN PERSISTS. 100 tablet 3   pantoprazole (PROTONIX) 40 MG tablet TAKE 1 TABLET BY MOUTH  DAILY (Patient taking differently: Take 40 mg by mouth daily.) 90 tablet 3   polyethylene glycol (MIRALAX / GLYCOLAX) 17 g packet Take 17 g by mouth daily as needed for mild constipation.     potassium chloride (KLOR-CON) 10 MEQ tablet Take 20 mEq by mouth daily.     rivaroxaban (XARELTO) 20 MG TABS tablet Take 20 mg by mouth daily with supper.     sennosides-docusate sodium (SENOKOT-S) 8.6-50 MG  tablet Take 2 tablets by mouth daily.     vitamin B-12 (CYANOCOBALAMIN) 100 MCG tablet Take 100 mcg by mouth daily.      No current facility-administered medications on file prior to visit.    ALLERGIES: Allergies  Allergen Reactions   Buspar [Buspirone] Other (See Comments)    Fatigue, body aches and shortness of breath    Latex Rash   Tape Rash and Other (See Comments)    USE PAPER    FAMILY HISTORY: Family History  Problem Relation Age of Onset   Hypertension Father    Hyperlipidemia Father    Heart disease Father    Prostate cancer Father    Alzheimer's disease Mother    Hypertension Sister    Hyperlipidemia Sister    Colon cancer Neg Hx    Esophageal cancer Neg Hx    Rectal cancer Neg Hx    Stomach cancer Neg Hx     SOCIAL HISTORY: Social History   Socioeconomic History   Marital status: Widowed    Spouse name: Not on file   Number of children: 3   Years of education: Not on file   Highest education level: Not on file  Occupational History   Occupation: self employed/ Art gallery manager  Tobacco Use   Smoking status: Never   Smokeless tobacco: Never  Vaping Use   Vaping Use: Never used  Substance and Sexual Activity   Alcohol use: Not Currently    Alcohol/week: 2.0 standard drinks    Types: 2 Cans of beer per week   Drug use: No   Sexual activity: Not Currently  Other Topics Concern   Not on file  Social History Narrative   Full time. Married.    Right handed   Drinks caffeine   [facility]   Social Determinants of Health   Financial Resource Strain: Low Risk    Difficulty of Paying Living Expenses: Not hard at all  Food Insecurity: No Food Insecurity   Worried About [name] fundraiser in the Last Year: Never true   [place] in the Last Year: Never true  Transportation Needs: No Data processing manager (Medical): No   Lack of Transportation (Non-Medical): No  Physical Activity: Insufficiently Active   Days of  Exercise per Week: 2 days   Minutes of Exercise per Session: 60 min  Stress: No Stress Concern Present   Feeling of Stress : Not at all  Social Connections: Not on file  Intimate Partner Violence: Not on file     PHYSICAL EXAM: Vitals:   01/20/21 1506  BP: 92/63  Pulse: 83  Resp: 18  SpO2: 92%   General: No acute distress Head:  Normocephalic/atraumatic Skin/Extremities: No rash, no edema Neurological Exam: alert and awake. Mild aphasia, able to follow commands. Fund of knowledge is reduced.  Recent and remote memory are impaired.  Attention and concentration are reduced.   Cranial nerves: Pupils equal, round. Extraocular movements intact with no nystagmus. Visual fields full.  No facial asymmetry.  Motor: Increased tone on right, muscle strength 5/5 on left UE and LE, 4-/5 right UE, 3/5 right LE. Finger to nose testing intact on left. Gait not tested.    IMPRESSION: This is a 80 yo RH man with a history of atrial fibrillation on Xarelto, s/p ICD placement, hypertension, colon cancer, left MCA stroke with traumatic left vertex SDH in March 2021 with residual right hemiparesis with recurrent episodes of unresponsiveness suggestive of focal seizures with impaired awareness. Repeat head CTs have shown chronic left frontal parietal encephalomalacia. EEG showed occasional left hemisphere slowing. No further staring spells since starting Depakote ER 500mg  qhs. Main concern today are anxiety, sleep difficulties, and hallucinations. We discussed starting Trazodone 50mg  1/2 tablet every night for 1 week, then increase to 1 tablet qhs. Side effects discussed, may uptitrate as tolerated. Another option would be to further increase Depakote dose. Continue 24/7 care. Follow-up in 3 months, they know to call for any changes.   Thank you for allowing me to participate in his care.  Please do not hesitate to call for any questions or concerns.    Ellouise Newer, M.D.   CC: Dr. Sharlet Salina

## 2021-01-20 NOTE — Telephone Encounter (Signed)
Remote ICM transmission received.  Attempted call to patient regarding ICM remote transmission and left message per DPR to return call.   

## 2021-01-20 NOTE — Patient Instructions (Signed)
Start Trazodone 50mg : take 1/2 tablet every night for 1 week, then increase to 1 tablet every night. Update me in 2 weeks how you are doing, we may increase dose if needed  2. Continue Depakote ER 250mg : take 2 tablets every night.  3. Follow-up in 3 months, call for any changes

## 2021-01-21 DIAGNOSIS — I2510 Atherosclerotic heart disease of native coronary artery without angina pectoris: Secondary | ICD-10-CM | POA: Diagnosis not present

## 2021-01-21 DIAGNOSIS — I4891 Unspecified atrial fibrillation: Secondary | ICD-10-CM | POA: Diagnosis not present

## 2021-01-21 DIAGNOSIS — Z7902 Long term (current) use of antithrombotics/antiplatelets: Secondary | ICD-10-CM | POA: Diagnosis not present

## 2021-01-21 DIAGNOSIS — K5901 Slow transit constipation: Secondary | ICD-10-CM | POA: Diagnosis not present

## 2021-01-21 DIAGNOSIS — I110 Hypertensive heart disease with heart failure: Secondary | ICD-10-CM | POA: Diagnosis not present

## 2021-01-21 DIAGNOSIS — I69351 Hemiplegia and hemiparesis following cerebral infarction affecting right dominant side: Secondary | ICD-10-CM | POA: Diagnosis not present

## 2021-01-21 DIAGNOSIS — D509 Iron deficiency anemia, unspecified: Secondary | ICD-10-CM | POA: Diagnosis not present

## 2021-01-21 DIAGNOSIS — I429 Cardiomyopathy, unspecified: Secondary | ICD-10-CM | POA: Diagnosis not present

## 2021-01-21 DIAGNOSIS — H3530 Unspecified macular degeneration: Secondary | ICD-10-CM | POA: Diagnosis not present

## 2021-01-21 DIAGNOSIS — K219 Gastro-esophageal reflux disease without esophagitis: Secondary | ICD-10-CM | POA: Diagnosis not present

## 2021-01-21 DIAGNOSIS — G9341 Metabolic encephalopathy: Secondary | ICD-10-CM | POA: Diagnosis not present

## 2021-01-21 DIAGNOSIS — E46 Unspecified protein-calorie malnutrition: Secondary | ICD-10-CM | POA: Diagnosis not present

## 2021-01-21 DIAGNOSIS — N390 Urinary tract infection, site not specified: Secondary | ICD-10-CM | POA: Diagnosis not present

## 2021-01-21 DIAGNOSIS — D539 Nutritional anemia, unspecified: Secondary | ICD-10-CM | POA: Diagnosis not present

## 2021-01-21 DIAGNOSIS — I5022 Chronic systolic (congestive) heart failure: Secondary | ICD-10-CM | POA: Diagnosis not present

## 2021-01-21 DIAGNOSIS — Z7901 Long term (current) use of anticoagulants: Secondary | ICD-10-CM | POA: Diagnosis not present

## 2021-01-21 DIAGNOSIS — E785 Hyperlipidemia, unspecified: Secondary | ICD-10-CM | POA: Diagnosis not present

## 2021-01-21 DIAGNOSIS — M1990 Unspecified osteoarthritis, unspecified site: Secondary | ICD-10-CM | POA: Diagnosis not present

## 2021-01-22 ENCOUNTER — Ambulatory Visit (INDEPENDENT_AMBULATORY_CARE_PROVIDER_SITE_OTHER): Payer: Medicare Other

## 2021-01-22 DIAGNOSIS — I5022 Chronic systolic (congestive) heart failure: Secondary | ICD-10-CM

## 2021-01-22 DIAGNOSIS — I255 Ischemic cardiomyopathy: Secondary | ICD-10-CM

## 2021-01-22 LAB — CUP PACEART REMOTE DEVICE CHECK
Battery Remaining Longevity: 119 mo
Battery Voltage: 3 V
Brady Statistic RV Percent Paced: 0.01 %
Date Time Interrogation Session: 20221026042506
HighPow Impedance: 39 Ohm
HighPow Impedance: 48 Ohm
Implantable Lead Implant Date: 20041020
Implantable Lead Implant Date: 20100913
Implantable Lead Location: 753860
Implantable Lead Location: 753860
Implantable Lead Model: 5076
Implantable Lead Model: 6949
Implantable Pulse Generator Implant Date: 20200909
Lead Channel Impedance Value: 285 Ohm
Lead Channel Impedance Value: 399 Ohm
Lead Channel Pacing Threshold Amplitude: 1.25 V
Lead Channel Pacing Threshold Pulse Width: 0.4 ms
Lead Channel Sensing Intrinsic Amplitude: 7.375 mV
Lead Channel Sensing Intrinsic Amplitude: 7.375 mV
Lead Channel Setting Pacing Amplitude: 2.5 V
Lead Channel Setting Pacing Pulse Width: 0.8 ms
Lead Channel Setting Sensing Sensitivity: 0.45 mV

## 2021-01-23 ENCOUNTER — Telehealth: Payer: Self-pay | Admitting: Internal Medicine

## 2021-01-23 DIAGNOSIS — I429 Cardiomyopathy, unspecified: Secondary | ICD-10-CM | POA: Diagnosis not present

## 2021-01-23 DIAGNOSIS — I4891 Unspecified atrial fibrillation: Secondary | ICD-10-CM | POA: Diagnosis not present

## 2021-01-23 DIAGNOSIS — M1990 Unspecified osteoarthritis, unspecified site: Secondary | ICD-10-CM | POA: Diagnosis not present

## 2021-01-23 DIAGNOSIS — I110 Hypertensive heart disease with heart failure: Secondary | ICD-10-CM | POA: Diagnosis not present

## 2021-01-23 DIAGNOSIS — H3530 Unspecified macular degeneration: Secondary | ICD-10-CM | POA: Diagnosis not present

## 2021-01-23 DIAGNOSIS — I5022 Chronic systolic (congestive) heart failure: Secondary | ICD-10-CM | POA: Diagnosis not present

## 2021-01-23 DIAGNOSIS — E785 Hyperlipidemia, unspecified: Secondary | ICD-10-CM | POA: Diagnosis not present

## 2021-01-23 DIAGNOSIS — I69351 Hemiplegia and hemiparesis following cerebral infarction affecting right dominant side: Secondary | ICD-10-CM | POA: Diagnosis not present

## 2021-01-23 DIAGNOSIS — D539 Nutritional anemia, unspecified: Secondary | ICD-10-CM | POA: Diagnosis not present

## 2021-01-23 DIAGNOSIS — G9341 Metabolic encephalopathy: Secondary | ICD-10-CM | POA: Diagnosis not present

## 2021-01-23 DIAGNOSIS — D509 Iron deficiency anemia, unspecified: Secondary | ICD-10-CM | POA: Diagnosis not present

## 2021-01-23 DIAGNOSIS — K219 Gastro-esophageal reflux disease without esophagitis: Secondary | ICD-10-CM | POA: Diagnosis not present

## 2021-01-23 DIAGNOSIS — I2510 Atherosclerotic heart disease of native coronary artery without angina pectoris: Secondary | ICD-10-CM | POA: Diagnosis not present

## 2021-01-23 DIAGNOSIS — Z7901 Long term (current) use of anticoagulants: Secondary | ICD-10-CM | POA: Diagnosis not present

## 2021-01-23 DIAGNOSIS — Z7902 Long term (current) use of antithrombotics/antiplatelets: Secondary | ICD-10-CM | POA: Diagnosis not present

## 2021-01-23 DIAGNOSIS — N390 Urinary tract infection, site not specified: Secondary | ICD-10-CM | POA: Diagnosis not present

## 2021-01-23 DIAGNOSIS — E46 Unspecified protein-calorie malnutrition: Secondary | ICD-10-CM | POA: Diagnosis not present

## 2021-01-23 DIAGNOSIS — K5901 Slow transit constipation: Secondary | ICD-10-CM | POA: Diagnosis not present

## 2021-01-23 NOTE — Telephone Encounter (Signed)
See below

## 2021-01-23 NOTE — Telephone Encounter (Signed)
Plaza Name: [place] Agency Name: Moreen Fowler Phone #: [phone] Service Requested: Speech Therapy/ OT  improve accuracy of swallowing..improve oral intake..reduce risk of choking  improve independence & safety w/ ADLs    Frequency of Visits: ST- 1 week 4 OT- 1 week 6

## 2021-01-24 NOTE — Telephone Encounter (Signed)
LVM with verbals 

## 2021-01-24 NOTE — Telephone Encounter (Signed)
Fine for verbals °

## 2021-01-27 ENCOUNTER — Ambulatory Visit (INDEPENDENT_AMBULATORY_CARE_PROVIDER_SITE_OTHER): Payer: Medicare Other | Admitting: Internal Medicine

## 2021-01-27 ENCOUNTER — Encounter: Payer: Self-pay | Admitting: Internal Medicine

## 2021-01-27 ENCOUNTER — Other Ambulatory Visit: Payer: Self-pay

## 2021-01-27 VITALS — BP 126/76 | HR 58 | Resp 18 | Ht 71.0 in | Wt 144.2 lb

## 2021-01-27 DIAGNOSIS — K4021 Bilateral inguinal hernia, without obstruction or gangrene, recurrent: Secondary | ICD-10-CM | POA: Diagnosis not present

## 2021-01-27 DIAGNOSIS — R443 Hallucinations, unspecified: Secondary | ICD-10-CM

## 2021-01-27 DIAGNOSIS — I5022 Chronic systolic (congestive) heart failure: Secondary | ICD-10-CM | POA: Diagnosis not present

## 2021-01-27 DIAGNOSIS — I10 Essential (primary) hypertension: Secondary | ICD-10-CM | POA: Diagnosis not present

## 2021-01-27 DIAGNOSIS — Z23 Encounter for immunization: Secondary | ICD-10-CM

## 2021-01-27 DIAGNOSIS — I69351 Hemiplegia and hemiparesis following cerebral infarction affecting right dominant side: Secondary | ICD-10-CM | POA: Diagnosis not present

## 2021-01-27 LAB — CBC
HCT: 39.8 % (ref 39.0–52.0)
Hemoglobin: 12.7 g/dL — ABNORMAL LOW (ref 13.0–17.0)
MCHC: 31.8 g/dL (ref 30.0–36.0)
MCV: 94.7 fl (ref 78.0–100.0)
Platelets: 138 10*3/uL — ABNORMAL LOW (ref 150.0–400.0)
RBC: 4.21 Mil/uL — ABNORMAL LOW (ref 4.22–5.81)
RDW: 23.6 % — ABNORMAL HIGH (ref 11.5–15.5)
WBC: 4.4 10*3/uL (ref 4.0–10.5)

## 2021-01-27 LAB — COMPREHENSIVE METABOLIC PANEL
ALT: 12 U/L (ref 0–53)
AST: 19 U/L (ref 0–37)
Albumin: 3.4 g/dL — ABNORMAL LOW (ref 3.5–5.2)
Alkaline Phosphatase: 141 U/L — ABNORMAL HIGH (ref 39–117)
BUN: 16 mg/dL (ref 6–23)
CO2: 29 mEq/L (ref 19–32)
Calcium: 8.9 mg/dL (ref 8.4–10.5)
Chloride: 98 mEq/L (ref 96–112)
Creatinine, Ser: 1.09 mg/dL (ref 0.40–1.50)
GFR: 64.27 mL/min (ref >60.00)
Glucose, Bld: 98 mg/dL (ref 70–99)
Potassium: 3.7 mEq/L (ref 3.5–5.1)
Sodium: 135 mEq/L (ref 135–145)
Total Bilirubin: 1.1 mg/dL (ref 0.2–1.2)
Total Protein: 6.1 g/dL (ref 6.0–8.3)

## 2021-01-27 LAB — BRAIN NATRIURETIC PEPTIDE: Pro B Natriuretic peptide (BNP): 1995 pg/mL — ABNORMAL HIGH (ref 0.0–100.0)

## 2021-01-27 NOTE — Progress Notes (Signed)
Remote ICD transmission.   

## 2021-01-27 NOTE — Progress Notes (Signed)
   Subjective:   Patient ID: Mark Clayton, male    DOB: [birth year], 80 y.o.   MRN: [id number]  HPI The patient is an 80 YO man coming in for medical concerns. Sister with him today and provides history. Not getting around as well. Fluid building up and they are having him do 60 mg lasix currently which is helping.  Review of Systems  Constitutional:  Positive for activity change and fatigue. Negative for appetite change.  HENT: Negative.    Eyes: Negative.   Respiratory:  Negative for cough, chest tightness and shortness of breath.   Cardiovascular:  Negative for chest pain, palpitations and leg swelling.  Gastrointestinal:  Positive for abdominal distention. Negative for abdominal pain, constipation, diarrhea, nausea and vomiting.  Musculoskeletal:  Positive for gait problem.  Skin: Negative.   Neurological:  Positive for weakness and numbness.  Psychiatric/Behavioral:  Positive for decreased concentration, dysphoric mood and hallucinations.    Objective:  Physical Exam Constitutional:      Appearance: He is well-developed. He is ill-appearing.  HENT:     Head: Normocephalic and atraumatic.  Cardiovascular:     Rate and Rhythm: Normal rate. Rhythm irregular.  Pulmonary:     Effort: Pulmonary effort is normal. No respiratory distress.     Breath sounds: Normal breath sounds. No wheezing or rales.  Abdominal:     General: Bowel sounds are normal. There is distension.     Palpations: Abdomen is soft.     Tenderness: There is no abdominal tenderness. There is no rebound.     Comments: Some mild abdominal distention  Musculoskeletal:        General: Deformity present.     Cervical back: Normal range of motion.     Comments: Edema right arm and leg on exam  Skin:    General: Skin is warm and dry.  Neurological:     Mental Status: He is alert. Mental status is at baseline.     Coordination: Coordination abnormal.    Vitals:   01/27/21 1110  BP: 126/76  Pulse: (!) 58   Resp: 18  Weight: 144 lb 3.2 oz (65.4 kg)  Height: 5\' 11"  (1.803 m)   Flu shot given at visit  This visit occurred during the SARS-CoV-2 public health emergency.  Safety protocols were in place, including screening questions prior to the visit, additional usage of staff PPE, and extensive cleaning of exam room while observing appropriate contact time as indicated for disinfecting solutions.   Assessment & Plan:  Visit time 25 minutes in face to face communication with patient and coordination of care, additional 10 minutes spent in record review, coordination or care, ordering tests, communicating/referring to other healthcare professionals, documenting in medical records all on the same day of the visit for total time 35 minutes spent on the visit.

## 2021-01-27 NOTE — Patient Instructions (Addendum)
We will check the labs today for the kidneys, liver, blood counts and fluid.  We can send in promethazine/dm for cough that we can prescribe if needed.

## 2021-01-28 DIAGNOSIS — I110 Hypertensive heart disease with heart failure: Secondary | ICD-10-CM | POA: Diagnosis not present

## 2021-01-28 DIAGNOSIS — I4891 Unspecified atrial fibrillation: Secondary | ICD-10-CM | POA: Diagnosis not present

## 2021-01-28 DIAGNOSIS — I2510 Atherosclerotic heart disease of native coronary artery without angina pectoris: Secondary | ICD-10-CM | POA: Diagnosis not present

## 2021-01-28 DIAGNOSIS — G9341 Metabolic encephalopathy: Secondary | ICD-10-CM | POA: Diagnosis not present

## 2021-01-28 DIAGNOSIS — I5022 Chronic systolic (congestive) heart failure: Secondary | ICD-10-CM | POA: Diagnosis not present

## 2021-01-28 DIAGNOSIS — M1990 Unspecified osteoarthritis, unspecified site: Secondary | ICD-10-CM | POA: Diagnosis not present

## 2021-01-28 DIAGNOSIS — E785 Hyperlipidemia, unspecified: Secondary | ICD-10-CM | POA: Diagnosis not present

## 2021-01-28 DIAGNOSIS — I69351 Hemiplegia and hemiparesis following cerebral infarction affecting right dominant side: Secondary | ICD-10-CM | POA: Diagnosis not present

## 2021-01-28 DIAGNOSIS — H3530 Unspecified macular degeneration: Secondary | ICD-10-CM | POA: Diagnosis not present

## 2021-01-28 DIAGNOSIS — N390 Urinary tract infection, site not specified: Secondary | ICD-10-CM | POA: Diagnosis not present

## 2021-01-28 DIAGNOSIS — K5901 Slow transit constipation: Secondary | ICD-10-CM | POA: Diagnosis not present

## 2021-01-28 DIAGNOSIS — Z7901 Long term (current) use of anticoagulants: Secondary | ICD-10-CM | POA: Diagnosis not present

## 2021-01-28 DIAGNOSIS — D539 Nutritional anemia, unspecified: Secondary | ICD-10-CM | POA: Diagnosis not present

## 2021-01-28 DIAGNOSIS — K219 Gastro-esophageal reflux disease without esophagitis: Secondary | ICD-10-CM | POA: Diagnosis not present

## 2021-01-28 DIAGNOSIS — D509 Iron deficiency anemia, unspecified: Secondary | ICD-10-CM | POA: Diagnosis not present

## 2021-01-28 DIAGNOSIS — Z7902 Long term (current) use of antithrombotics/antiplatelets: Secondary | ICD-10-CM | POA: Diagnosis not present

## 2021-01-28 DIAGNOSIS — E46 Unspecified protein-calorie malnutrition: Secondary | ICD-10-CM | POA: Diagnosis not present

## 2021-01-28 DIAGNOSIS — I429 Cardiomyopathy, unspecified: Secondary | ICD-10-CM | POA: Diagnosis not present

## 2021-01-28 NOTE — Assessment & Plan Note (Signed)
He is having more swelling on exam today after taking some otc cold medicine. They have removed that from house and he is now taking 60 mg lasix daily. We talked about dry weight and how this will vary. He has lost muscle mass over the last 1-2 years so his dry weight will be decreasing as well. Checking CMP and BNP to assess renal function and fluid status. Most recent ICD transmission with signs of fluid accumulation.

## 2021-01-28 NOTE — Progress Notes (Signed)
No ICM remote transmission received for 01/27/2021 and next ICM transmission scheduled for 02/10/2021.

## 2021-01-28 NOTE — Assessment & Plan Note (Signed)
They were asking about these again today and still I feel that observation is appropriate. We talked about signs that he needs to seek care including severe pain and inability to reduce. These are fat containing on recent CT scan and recurrent so likely this would be a difficult repair and given health status is not reasonable to pursue at this time.

## 2021-01-28 NOTE — Assessment & Plan Note (Signed)
He is having more swelling in the right side and less function as well.

## 2021-01-28 NOTE — Assessment & Plan Note (Signed)
Still working with neurology and there are some ongoing eye problems causing hallucinations as well as confusion at times.

## 2021-01-29 ENCOUNTER — Telehealth: Payer: Self-pay | Admitting: Internal Medicine

## 2021-01-29 NOTE — Telephone Encounter (Signed)
See below

## 2021-01-29 NOTE — Telephone Encounter (Signed)
Tylenol is safest for pain maximum daily dosage is 3000 mg.

## 2021-01-29 NOTE — Telephone Encounter (Signed)
Spoke with the patient's sister and she verbalized understanding recommendations from Dr. Sharlet Salina.

## 2021-01-29 NOTE — Telephone Encounter (Signed)
Patient daughter [name] calling in  Says patient was drinking water to fast last night & started choking & coughing  Patient is now complaining of a lot of pain in his chest/rib area due to the coughing  Melissa wants to know what she can give patient for this pain  Please call [phone]

## 2021-02-03 ENCOUNTER — Other Ambulatory Visit: Payer: Self-pay | Admitting: Cardiology

## 2021-02-03 ENCOUNTER — Ambulatory Visit: Payer: Medicare Other | Admitting: Neurology

## 2021-02-04 ENCOUNTER — Telehealth: Payer: Self-pay | Admitting: Neurology

## 2021-02-04 MED ORDER — TRAZODONE HCL 50 MG PO TABS: ORAL_TABLET | ORAL | 5 refills | Status: DC

## 2021-02-04 NOTE — Addendum Note (Signed)
Addended by: Jake Seats on: 02/04/2021 04:17 PM   Modules accepted: Orders

## 2021-02-04 NOTE — Telephone Encounter (Signed)
Pt daughter, [name] is calling someone back

## 2021-02-04 NOTE — Telephone Encounter (Signed)
Was put on trazadone 50mg , he still not sleeping. It has helped a little, but still not 100%. He is still seeing things, just not as bad.

## 2021-02-04 NOTE — Telephone Encounter (Signed)
Pt daughter called no answer left a voice mail to call the office back  

## 2021-02-04 NOTE — Telephone Encounter (Signed)
Spoke with pt daughter new script sent to pharmacy for trazodone

## 2021-02-04 NOTE — Telephone Encounter (Signed)
If no side effects, she can increase Trazodone 50mg : take 2 tabs qhs. If she agrees, pls update Rx, thanks

## 2021-02-04 NOTE — Telephone Encounter (Signed)
See other phone note

## 2021-02-07 ENCOUNTER — Inpatient Hospital Stay (HOSPITAL_COMMUNITY)
Admission: EM | Admit: 2021-02-07 | Discharge: 2021-02-19 | DRG: 291 | Disposition: A | Discharge status: Home Health | Payer: Medicare Other | Attending: Family Medicine | Admitting: Family Medicine

## 2021-02-07 ENCOUNTER — Emergency Department (HOSPITAL_COMMUNITY): Payer: Medicare Other

## 2021-02-07 ENCOUNTER — Encounter (HOSPITAL_COMMUNITY): Payer: Self-pay

## 2021-02-07 DIAGNOSIS — I4821 Permanent atrial fibrillation: Secondary | ICD-10-CM | POA: Diagnosis present

## 2021-02-07 DIAGNOSIS — I5023 Acute on chronic systolic (congestive) heart failure: Secondary | ICD-10-CM | POA: Diagnosis not present

## 2021-02-07 DIAGNOSIS — Z95810 Presence of automatic (implantable) cardiac defibrillator: Secondary | ICD-10-CM

## 2021-02-07 DIAGNOSIS — M1990 Unspecified osteoarthritis, unspecified site: Secondary | ICD-10-CM | POA: Diagnosis not present

## 2021-02-07 DIAGNOSIS — I69351 Hemiplegia and hemiparesis following cerebral infarction affecting right dominant side: Secondary | ICD-10-CM

## 2021-02-07 DIAGNOSIS — I447 Left bundle-branch block, unspecified: Secondary | ICD-10-CM | POA: Diagnosis present

## 2021-02-07 DIAGNOSIS — I5043 Acute on chronic combined systolic (congestive) and diastolic (congestive) heart failure: Secondary | ICD-10-CM | POA: Diagnosis present

## 2021-02-07 DIAGNOSIS — I2510 Atherosclerotic heart disease of native coronary artery without angina pectoris: Secondary | ICD-10-CM | POA: Diagnosis present

## 2021-02-07 DIAGNOSIS — I517 Cardiomegaly: Secondary | ICD-10-CM | POA: Diagnosis not present

## 2021-02-07 DIAGNOSIS — Z85038 Personal history of other malignant neoplasm of large intestine: Secondary | ICD-10-CM

## 2021-02-07 DIAGNOSIS — E876 Hypokalemia: Secondary | ICD-10-CM | POA: Diagnosis present

## 2021-02-07 DIAGNOSIS — Z9049 Acquired absence of other specified parts of digestive tract: Secondary | ICD-10-CM

## 2021-02-07 DIAGNOSIS — D509 Iron deficiency anemia, unspecified: Secondary | ICD-10-CM | POA: Diagnosis not present

## 2021-02-07 DIAGNOSIS — K5901 Slow transit constipation: Secondary | ICD-10-CM | POA: Diagnosis not present

## 2021-02-07 DIAGNOSIS — E46 Unspecified protein-calorie malnutrition: Secondary | ICD-10-CM | POA: Diagnosis not present

## 2021-02-07 DIAGNOSIS — N390 Urinary tract infection, site not specified: Secondary | ICD-10-CM | POA: Diagnosis not present

## 2021-02-07 DIAGNOSIS — Z7901 Long term (current) use of anticoagulants: Secondary | ICD-10-CM | POA: Diagnosis not present

## 2021-02-07 DIAGNOSIS — K219 Gastro-esophageal reflux disease without esophagitis: Secondary | ICD-10-CM | POA: Diagnosis present

## 2021-02-07 DIAGNOSIS — R64 Cachexia: Secondary | ICD-10-CM | POA: Diagnosis present

## 2021-02-07 DIAGNOSIS — I4891 Unspecified atrial fibrillation: Secondary | ICD-10-CM | POA: Diagnosis not present

## 2021-02-07 DIAGNOSIS — I509 Heart failure, unspecified: Secondary | ICD-10-CM | POA: Diagnosis not present

## 2021-02-07 DIAGNOSIS — Z79899 Other long term (current) drug therapy: Secondary | ICD-10-CM

## 2021-02-07 DIAGNOSIS — I255 Ischemic cardiomyopathy: Secondary | ICD-10-CM | POA: Diagnosis present

## 2021-02-07 DIAGNOSIS — I252 Old myocardial infarction: Secondary | ICD-10-CM

## 2021-02-07 DIAGNOSIS — Z7902 Long term (current) use of antithrombotics/antiplatelets: Secondary | ICD-10-CM | POA: Diagnosis not present

## 2021-02-07 DIAGNOSIS — D6959 Other secondary thrombocytopenia: Secondary | ICD-10-CM | POA: Diagnosis present

## 2021-02-07 DIAGNOSIS — Z743 Need for continuous supervision: Secondary | ICD-10-CM | POA: Diagnosis not present

## 2021-02-07 DIAGNOSIS — Z8042 Family history of malignant neoplasm of prostate: Secondary | ICD-10-CM

## 2021-02-07 DIAGNOSIS — I429 Cardiomyopathy, unspecified: Secondary | ICD-10-CM | POA: Diagnosis not present

## 2021-02-07 DIAGNOSIS — H3530 Unspecified macular degeneration: Secondary | ICD-10-CM | POA: Diagnosis not present

## 2021-02-07 DIAGNOSIS — I10 Essential (primary) hypertension: Secondary | ICD-10-CM | POA: Diagnosis present

## 2021-02-07 DIAGNOSIS — I472 Ventricular tachycardia, unspecified: Secondary | ICD-10-CM | POA: Diagnosis not present

## 2021-02-07 DIAGNOSIS — Z20822 Contact with and (suspected) exposure to covid-19: Secondary | ICD-10-CM | POA: Diagnosis present

## 2021-02-07 DIAGNOSIS — J90 Pleural effusion, not elsewhere classified: Secondary | ICD-10-CM | POA: Diagnosis not present

## 2021-02-07 DIAGNOSIS — I110 Hypertensive heart disease with heart failure: Secondary | ICD-10-CM | POA: Diagnosis not present

## 2021-02-07 DIAGNOSIS — Z951 Presence of aortocoronary bypass graft: Secondary | ICD-10-CM

## 2021-02-07 DIAGNOSIS — I25119 Atherosclerotic heart disease of native coronary artery with unspecified angina pectoris: Secondary | ICD-10-CM | POA: Diagnosis present

## 2021-02-07 DIAGNOSIS — G9341 Metabolic encephalopathy: Secondary | ICD-10-CM | POA: Diagnosis not present

## 2021-02-07 DIAGNOSIS — I4819 Other persistent atrial fibrillation: Secondary | ICD-10-CM | POA: Diagnosis present

## 2021-02-07 DIAGNOSIS — Z8249 Family history of ischemic heart disease and other diseases of the circulatory system: Secondary | ICD-10-CM

## 2021-02-07 DIAGNOSIS — I5084 End stage heart failure: Secondary | ICD-10-CM | POA: Diagnosis present

## 2021-02-07 DIAGNOSIS — Z66 Do not resuscitate: Secondary | ICD-10-CM | POA: Diagnosis present

## 2021-02-07 DIAGNOSIS — Z6820 Body mass index (BMI) 20.0-20.9, adult: Secondary | ICD-10-CM

## 2021-02-07 DIAGNOSIS — R32 Unspecified urinary incontinence: Secondary | ICD-10-CM | POA: Diagnosis present

## 2021-02-07 DIAGNOSIS — R109 Unspecified abdominal pain: Secondary | ICD-10-CM | POA: Diagnosis not present

## 2021-02-07 DIAGNOSIS — R0602 Shortness of breath: Secondary | ICD-10-CM | POA: Diagnosis not present

## 2021-02-07 DIAGNOSIS — E785 Hyperlipidemia, unspecified: Secondary | ICD-10-CM | POA: Diagnosis not present

## 2021-02-07 DIAGNOSIS — E871 Hypo-osmolality and hyponatremia: Secondary | ICD-10-CM | POA: Diagnosis present

## 2021-02-07 DIAGNOSIS — I5022 Chronic systolic (congestive) heart failure: Secondary | ICD-10-CM | POA: Diagnosis not present

## 2021-02-07 DIAGNOSIS — I130 Hypertensive heart and chronic kidney disease with heart failure and stage 1 through stage 4 chronic kidney disease, or unspecified chronic kidney disease: Principal | ICD-10-CM | POA: Diagnosis present

## 2021-02-07 DIAGNOSIS — I499 Cardiac arrhythmia, unspecified: Secondary | ICD-10-CM | POA: Diagnosis not present

## 2021-02-07 DIAGNOSIS — Z955 Presence of coronary angioplasty implant and graft: Secondary | ICD-10-CM

## 2021-02-07 DIAGNOSIS — N1831 Chronic kidney disease, stage 3a: Secondary | ICD-10-CM | POA: Diagnosis present

## 2021-02-07 DIAGNOSIS — D539 Nutritional anemia, unspecified: Secondary | ICD-10-CM | POA: Diagnosis not present

## 2021-02-07 DIAGNOSIS — Z681 Body mass index (BMI) 19 or less, adult: Secondary | ICD-10-CM

## 2021-02-07 HISTORY — DX: Unspecified systolic (congestive) heart failure: I50.20

## 2021-02-07 LAB — RESP PANEL BY RT-PCR (FLU A&B, COVID) ARPGX2
Influenza A by PCR: NEGATIVE
Influenza B by PCR: NEGATIVE
SARS Coronavirus 2 by RT PCR: NEGATIVE

## 2021-02-07 NOTE — ED Provider Notes (Signed)
[facility] EMERGENCY DEPARTMENT Provider Note   CSN: [id number] Arrival date & time: 02/07/21  2135     History Chief Complaint  Patient presents with   Abdominal Pain    CARLE FENECH is a 80 y.o. male.   Abdominal Pain Associated symptoms: shortness of breath   Associated symptoms: no chest pain, no chills, no cough, no fever, no nausea, no sore throat and no vomiting    Patient is an 80 year old male with a PMH significant for AICD, CAD s/p MI and CABG, CHF (last EF in 2021 <20%), HTN, HLD, ischemic cardiomyopathy paroxysmal atrial fibrillation on Xarelto, and others as below who presents to the ED with complaints of fluid overload.  Patient states that for the past 3 days, he has had worsening extremity edema and discomfort from anasarca.  He denies any fevers or chills, chest pain or cough, nausea or vomiting, or any other complaints.  He reports symmetric lower extremity swelling.  He was recently up titrated from 40 mg to 60 mg of Lasix by his PCP.  States he is on dialysis, but unable to find documentation of this.  Past Medical History:  Diagnosis Date   AICD (automatic cardioverter/defibrillator) present 01/17/2003   Medtronic Maximo 7232CX ICD, serial #[id number]   Anemia 02-06-11   takes oral iron   Arthritis    hands, knees   CAD (coronary artery disease) 2003   a. h/o MI and CABG in 2003. b. s/p DES to SVG-RPDA-RPLB in 08/2014.   Cancer of sigmoid colon ([place]) 2012   a. s/p colon surgery.   Carotid bruit    Chronic systolic CHF (congestive heart failure) ([place])    a. EF 20% in 2014; b. 08/2017 Echo: EF 20-25%, diff HK, Gr3 DD. Triv AI. Mod MR. Sev dil LA. Mildly dil RV w/ mildly reduced RV fxn. Mildly dil RA. Mod TR. PASP 104mHg.   Cough    Exudative age-related macular degeneration of left eye with active choroidal neovascularization ([place]) 07/12/2019   Exudative age-related macular degeneration, right eye, with inactive choroidal neovascularization  ([place]) 07/12/2019   GERD (gastroesophageal reflux disease) 02-06-11   HTN (hypertension)    Hyperlipidemia    Ischemic cardiomyopathy    a. EF 20% in 2014. (Master study EF >20%); b. 08/2017 Echo: EF 20-25%, diff HK. Gr3 DD.   LV (left ventricular) mural thrombus    a. 12/2012 Echo: EF 20% with mural thrombus No evidence of thrombus on 08/2017 echo.   Macular degeneration    Myocardial infarct ([place])    2003   PAF (paroxysmal atrial fibrillation) ([facility])    a. CHA2DS2VASc = 5-->Xarelto/Tikosyn.   Ventricular tachycardia 11/05/2020    Patient Active Problem List   Diagnosis Date Noted   Delirium 11/26/2020   Pleural effusion due to CHF (congestive heart failure) ([place]) 11/13/2020   Ventricular tachycardia 11/05/2020   Chronic cough 10/24/2020   Bilateral inguinal hernia 10/11/2020   Focal neurological deficit, onset greater than 24 hours 09/27/2020   Hallucination 09/27/2020   Pre-diabetes 09/27/2020   Anxiety 08/14/2020   Hemiplegia of right dominant side as late effect of cerebral infarction ([place]) 07/19/2020   Low urine output 07/05/2020   Hypotension 07/05/2020   Bradycardia 04/14/2020   Exudative age-related macular degeneration of left eye with inactive choroidal neovascularization ([place]) 02/13/2020   Exudative age-related macular degeneration of right eye with active choroidal neovascularization ([place]) 07/12/2019   Intermediate stage nonexudative age-related macular degeneration of left eye 07/12/2019   Hx  of completed stroke 06/14/2019   Dysarthria    Tinnitus 08/26/2018   Essential hypertension 01/21/2018   Coronary artery disease involving native heart with angina pectoris ([place]) 10/19/2017   Persistent atrial fibrillation    Goals of care, counseling/discussion 01/10/2016   Ischemic cardiomyopathy    Personal history of colon cancer    Implantable cardioverter-defibrillator (ICD) in situ 03/09/2012   Microcytic anemia 01/12/2011   Cardiac defibrillator  MDT VVI 12/26/2010    Hyperlipidemia 86/57/8469   Chronic systolic heart failure ([place]) 11/26/2008    Past Surgical History:  Procedure Laterality Date   CARDIAC CATHETERIZATION N/A 09/20/2014   Procedure: Left Heart Cath and Coronary Angiography;  Surgeon: Jettie Booze, MD; LAD 95%, D1 100%, CFX liner percent, OM 200%, OM 390%, RCA 90%, LIMA-LAD okay, SVG-OM 2-OM 3 minimal disease, SVG-RPDA-RPLB 100% between the RPDA and RPL      CARDIAC CATHETERIZATION N/A 09/20/2014   Procedure: Coronary Stent Intervention;  Surgeon: Jettie Booze, MD; Synergy DES 4 x 24 mm reducing the stenosis to 5%    CARDIAC DEFIBRILLATOR PLACEMENT  01/17/03   6949 lead. Medtronic. remote-no; with later revision   CARDIOVERSION N/A 05/22/2016   Procedure: CARDIOVERSION;  Surgeon: Lelon Perla, MD;  Location: [facility] ENDOSCOPY;  Service: Cardiovascular;  Laterality: N/A;   CARDIOVERSION N/A 08/10/2017   Procedure: CARDIOVERSION;  Surgeon: Pixie Casino, MD;  Location: [facility] ENDOSCOPY;  Service: Cardiovascular;  Laterality: N/A;   CATARACT EXTRACTION W/ INTRAOCULAR LENS  IMPLANT, BILATERAL Bilateral June/-July 2009   Dr. Katy Fitch   CATARACT EXTRACTION W/PHACO Bilateral 2010   Dr. Katy Fitch   COLON RESECTION  02/09/2011   Procedure: LAPAROSCOPIC SIGMOID COLON RESECTION;  Surgeon: Pedro Earls, MD;  Location: [facility] ORS;  Service: General;  Laterality: N/A;  Laparoscopic Assisted Sigmoid Colectomy   COLON SURGERY     COLONOSCOPY  08/31/2011   Procedure: COLONOSCOPY;  Surgeon: Jerene Bears, MD;  Location: [facility] ENDOSCOPY;  Service: Gastroenterology;  Laterality: N/A;   COLONOSCOPY N/A 09/05/2012   Procedure: COLONOSCOPY;  Surgeon: Jerene Bears, MD;  Location: [facility] ENDOSCOPY;  Service: Gastroenterology;  Laterality: N/A;   COLONOSCOPY N/A 04/18/2013   Procedure: COLONOSCOPY;  Surgeon: Jerene Bears, MD;  Location: [facility] ENDOSCOPY;  Service: Gastroenterology;  Laterality: N/A;   COLONOSCOPY N/A 04/09/2014   Procedure: COLONOSCOPY;  Surgeon: Jerene Bears,  MD;  Location: [facility] ENDOSCOPY;  Service: Gastroenterology;  Laterality: N/A;   COLONOSCOPY WITH PROPOFOL N/A 05/03/2017   Procedure: COLONOSCOPY WITH PROPOFOL;  Surgeon: Jerene Bears, MD;  Location: [facility] ENDOSCOPY;  Service: Gastroenterology;  Laterality: N/A;   CORONARY ANGIOPLASTY     CORONARY ARTERY BYPASS GRAFT  01/2002   LIMA-LAD, SVG-OM 2-OM 3, SVG-RPDA-RPLB   CORONARY STENT INTERVENTION N/A 11/22/2018   Procedure: CORONARY STENT INTERVENTION;  Surgeon: Nelva Bush, MD;  Location: [place] [facility];  Service: Cardiovascular;  Laterality: N/A;  SVG - RCA   ICD GENERATOR CHANGE  2010   Medtronic Virtuoso II VR ICD   ICD GENERATOR CHANGEOUT N/A 12/07/2018   Procedure: ICD GENERATOR CHANGEOUT;  Surgeon: Deboraha Sprang, MD;  Location: [place] [facility];  Service: Cardiovascular;  Laterality: N/A;   INGUINAL HERNIA REPAIR Right 2000's X 2   LAPAROSCOPIC RIGHT HEMI COLECTOMY N/A 11/04/2012   Procedure: LAPAROSCOPIC RIGHT HEMI COLECTOMY;  Surgeon: Pedro Earls, MD;  Location: [facility];  Service: General;  Laterality: N/A;   LEFT HEART CATH AND CORS/GRAFTS ANGIOGRAPHY Left 11/22/2018   Procedure: LEFT HEART  CATH AND CORS/GRAFTS ANGIOGRAPHY;  Surgeon: Nelva Bush, MD;  Location: [place] [facility];  Service: Cardiovascular;  Laterality: Left;   TONSILLECTOMY  ~ 1950       Family History  Problem Relation Age of Onset   Hypertension Father    Hyperlipidemia Father    Heart disease Father    Prostate cancer Father    Alzheimer's disease Mother    Hypertension Sister    Hyperlipidemia Sister    Colon cancer Neg Hx    Esophageal cancer Neg Hx    Rectal cancer Neg Hx    Stomach cancer Neg Hx     Social History   Tobacco Use   Smoking status: Never   Smokeless tobacco: Never  Vaping Use   Vaping Use: Never used  Substance Use Topics   Alcohol use: Not Currently    Alcohol/week: 2.0 standard drinks    Types: 2 Cans of beer per week   Drug use: No    Home  Medications Prior to Admission medications   Medication Sig Start Date End Date Taking? Authorizing Provider  atorvastatin (LIPITOR) 80 MG tablet TAKE 1 TABLET BY MOUTH  DAILY Patient taking differently: Take 80 mg by mouth daily. 05/03/20   Jerline Pain, MD  baclofen (LIORESAL) 10 MG tablet Take 1 tablet (10 mg total) by mouth 2 (two) times daily as needed for muscle spasms. 11/12/20   [name], MD  carvedilol (COREG) 3.125 MG tablet Take 1 tablet (3.125 mg total) by mouth 2 (two) times daily with a meal. 11/21/20   Shelly Coss, MD  divalproex (DEPAKOTE ER) 250 MG 24 hr tablet Take 2 tablets every night 01/20/21   Cameron Sprang, MD  ferrous sulfate 325 (65 FE) MG tablet Take 1 tablet (325 mg total) by mouth 2 (two) times daily with a meal. 11/21/20   Shelly Coss, MD  furosemide (LASIX) 20 MG tablet Take 10 mg by mouth. Take a 1/2 tab with the 64m equal 50 mg    [provider]  furosemide (LASIX) 40 MG tablet Take 1.5 tablets (60 mg total) by mouth 2 (two) times daily. Patient taking differently: Take 40 mg by mouth daily. 11/05/20   Jerline Pain, MD  meclizine (ANTIVERT) 25 MG tablet Take 25 mg by mouth 3 (three) times daily as needed for dizziness. 1/2 tablet every 14 days for dizziness    [provider]  nitroGLYCERIN (NITROSTAT) 0.4 MG SL tablet DISSOLVE 1 TABLET UNDER THE TONGUE EVERY 5 MINUTES AS  NEEDED FOR CHEST PAIN. MAX  OF 3 TABLETS IN 15 MINUTES. CALL 911 IF PAIN PERSISTS. 12/31/20   Jerline Pain, MD  pantoprazole (PROTONIX) 40 MG tablet TAKE 1 TABLET BY MOUTH  DAILY Patient taking differently: Take 40 mg by mouth daily. 07/17/20   [name], MD  polyethylene glycol (MIRALAX / GLYCOLAX) 17 g packet Take 17 g by mouth daily as needed for mild constipation.    [provider]  potassium chloride (KLOR-CON) 10 MEQ tablet Take 20 mEq by mouth daily.    [provider]  rivaroxaban (XARELTO) 20 MG TABS tablet Take 20 mg by  mouth daily with supper.    [provider]  sennosides-docusate sodium (SENOKOT-S) 8.6-50 MG tablet Take 2 tablets by mouth daily.    [provider]  traZODone (DESYREL) 50 MG tablet Take 2 tablet every night 02/04/21   Cameron Sprang, MD  vitamin B-12 (CYANOCOBALAMIN) 100 MCG tablet Take  100 mcg by mouth daily.     [provider]    Allergies    Buspar [buspirone], Latex, and Tape  Review of Systems   Review of Systems  Constitutional:  Negative for activity change, appetite change, chills and fever.  HENT:  Negative for ear pain and sore throat.   Eyes:  Negative for visual disturbance.  Respiratory:  Positive for shortness of breath. Negative for cough, wheezing and stridor.   Cardiovascular:  Positive for leg swelling. Negative for chest pain and palpitations.  Gastrointestinal:  Negative for abdominal pain, nausea and vomiting.  Musculoskeletal:  Negative for arthralgias, back pain, neck pain and neck stiffness.  Skin:  Negative for color change, pallor and rash.  Neurological:  Negative for seizures and syncope.  Psychiatric/Behavioral:  Negative for confusion. The patient is not nervous/anxious.   All other systems reviewed and are negative.  Physical Exam Updated Vital Signs BP 112/77   Pulse 86   Temp (!) 96.3 F (35.7 C) (Axillary)   Resp 20   SpO2 100%   Physical Exam Vitals and nursing note reviewed.  Constitutional:      General: He is not in acute distress.    Appearance: Normal appearance. He is well-developed and normal weight. He is not ill-appearing, toxic-appearing or diaphoretic.  HENT:     Head: Normocephalic and atraumatic.     Right Ear: External ear normal. Decreased hearing noted.     Left Ear: External ear normal. Decreased hearing noted.     Nose: Nose normal.     Mouth/Throat:     Mouth: Mucous membranes are moist.     Pharynx: Oropharynx is clear.  Eyes:     General: No scleral icterus.    Extraocular Movements:  Extraocular movements intact.     Conjunctiva/sclera: Conjunctivae normal.     Pupils: Pupils are equal, round, and reactive to light.  Neck:     Vascular: No JVD.     Trachea: Trachea and phonation normal.  Cardiovascular:     Rate and Rhythm: Normal rate and regular rhythm.     Pulses: Normal pulses.     Heart sounds: Normal heart sounds. No murmur heard.    Comments: Warm and well perfused Pulmonary:     Effort: Pulmonary effort is normal. No tachypnea, bradypnea, accessory muscle usage, prolonged expiration, respiratory distress or retractions.     Breath sounds: No decreased air movement. Examination of the right-lower field reveals rales. Examination of the left-lower field reveals rales. Rales present.  Abdominal:     General: There is distension.     Palpations: Abdomen is soft.     Tenderness: There is no abdominal tenderness. There is no guarding or rebound.     Hernia: No hernia is present.     Comments: Mildly distended  Musculoskeletal:        General: Normal range of motion.     Cervical back: Normal range of motion and neck supple. No rigidity or tenderness.     Right lower leg: No tenderness or bony tenderness. 4+ Pitting Edema present.     Left lower leg: No tenderness or bony tenderness. 4+ Pitting Edema present.     Comments: No erythema or asymmetric swelling, no pain  Lymphadenopathy:     Cervical: No cervical adenopathy.  Skin:    General: Skin is warm and dry.     Capillary Refill: Capillary refill takes less than 2 seconds.  Neurological:     General: No focal  deficit present.     Mental Status: He is alert and oriented to person, place, and time. Mental status is at baseline.     GCS: GCS eye subscore is 4. GCS verbal subscore is 5. GCS motor subscore is 6.  Psychiatric:        Mood and Affect: Mood normal.        Behavior: Behavior normal. Behavior is cooperative.    ED Results / Procedures / Treatments   Labs (all labs ordered are listed, but only  abnormal results are displayed) Labs Reviewed  BASIC METABOLIC PANEL - Abnormal; Notable for the following components:      Result Value   Sodium 134 (*)    BUN 26 (*)    Creatinine, Ser 1.37 (*)    Calcium 8.3 (*)    GFR, Estimated 52 (*)    All other components within normal limits  CBC - Abnormal; Notable for the following components:   Hemoglobin 12.7 (*)    RDW 20.5 (*)    Platelets 127 (*)    All other components within normal limits  PROTIME-INR - Abnormal; Notable for the following components:   Prothrombin Time 29.7 (*)    INR 2.8 (*)    All other components within normal limits  BRAIN NATRIURETIC PEPTIDE - Abnormal; Notable for the following components:   B Natriuretic Peptide 2,099.8 (*)    All other components within normal limits  RESP PANEL BY RT-PCR (FLU A&B, COVID) ARPGX2  PHOSPHORUS    EKG None  Radiology DG Chest 2 View  Result Date: 02/07/2021 CLINICAL DATA:  Shortness of breath, CHF. EXAM: CHEST - 2 VIEW COMPARISON:  Chest x-ray 11/13/2020. FINDINGS: Left-sided pacemaker is present. Patient is status post cardiac surgery. The heart is enlarged, unchanged. There are small bilateral pleural effusions, left greater than right. There is no lung consolidation or pneumothorax. No acute fractures are seen. IMPRESSION: 1. Cardiomegaly with small bilateral pleural effusions. Electronically Signed   By: Ronney Asters M.D.   On: 02/07/2021 23:13    Procedures Procedures   Medications Ordered in ED Medications  furosemide (LASIX) injection 60 mg (has no administration in time range)    ED Course  I have reviewed the triage vital signs and the nursing notes.  Pertinent labs & imaging results that were available during my care of the patient were reviewed by me and considered in my medical decision making (see chart for details).    MDM Rules/Calculators/A&P                           OLIVIER FRAYRE is a 80 y.o. male presenting with abdominal pain. Initial VS  wnl.  EKG interpretation: Ventricular paced rhythm, rate 88 bpm.  Similar to prior in August.  Labs: Mild thrombocytopenia near prior baseline.  BNP 2099, increased from previous.  PT/INR elevated.  Family increased creatinine from prior, elevated BUN.  COVID/flu negative.  Imaging: CXR with cardiomegaly and small bilateral pleural effusions, right is new from prior.  Imaging was reviewed by radiology and personally by me.  DDX considered: COPD exacerbation, PE, CAP, DVT, cellulitis, lymphedema. History, examination, and objective data most consistent with CHF exacerbation, given significantly decreased EF at baseline and increased BNP from prior.  Wells PE criteria 0, low concern at this time.  Wells DVT score 1, low suspicion for etiology given symmetric swelling and lack of pain on palpation.  She has also anticoagulated due to  prior atrial fibrillation history.  No sign of cellulitis or other infectious process.  No systemic infectious signs or symptoms.  No focal lobar opacities on CXR.  No wheezing.  Medications: Medications  furosemide (LASIX) injection 60 mg (has no administration in time range)    Patient reevaluated.  Hemodynamically stable and in no acute distress.  Oncoming provider to follow up on pending symptomatic response to diuretics.  Final disposition pending, possible admission for symptomatic CHF exacerbation, versus discharge home with continued p.o. diuresis.  Patient will require ambulation trial to determine worsening of SIB or increased oxygen requirement.  Patient understands and agrees with the plan.  Transfer of Care Note I transferred care of DONNOVAN STAMOUR at the end of my shift.  I discussed the patient's presentation and critical findings with the oncoming team.  Please see their documentation for the remainder of care.  I answered the incoming team's questions to the best of my ability.   The plan for this patient was discussed with my attending physician, who  voiced agreement and who oversaw evaluation and treatment of this patient.     Note: Estate manager/land agent was used in the creation of this note.  Final Clinical Impression(s) / ED Diagnoses Final diagnoses:  Acute on chronic systolic congestive heart failure [facility])    Rx / DC Orders ED Discharge Orders     None        Cherly Hensen, DO 02/08/21 0107    Tegeler, Gwenyth Allegra, MD 02/08/21 1445

## 2021-02-07 NOTE — ED Triage Notes (Signed)
Pt comes via [facility] from home for abd pain and extremity edema from CHF that has been going on for the past week and worse over the last 3 days, PCP increased lasix from 40mg  to 60mg  3 days ago.

## 2021-02-08 ENCOUNTER — Inpatient Hospital Stay (HOSPITAL_COMMUNITY): Payer: Medicare Other

## 2021-02-08 ENCOUNTER — Encounter (HOSPITAL_COMMUNITY): Payer: Self-pay | Admitting: Internal Medicine

## 2021-02-08 ENCOUNTER — Other Ambulatory Visit: Payer: Self-pay

## 2021-02-08 DIAGNOSIS — I10 Essential (primary) hypertension: Secondary | ICD-10-CM

## 2021-02-08 DIAGNOSIS — Z7901 Long term (current) use of anticoagulants: Secondary | ICD-10-CM

## 2021-02-08 DIAGNOSIS — I69351 Hemiplegia and hemiparesis following cerebral infarction affecting right dominant side: Secondary | ICD-10-CM | POA: Diagnosis not present

## 2021-02-08 DIAGNOSIS — I4819 Other persistent atrial fibrillation: Secondary | ICD-10-CM

## 2021-02-08 DIAGNOSIS — I130 Hypertensive heart and chronic kidney disease with heart failure and stage 1 through stage 4 chronic kidney disease, or unspecified chronic kidney disease: Secondary | ICD-10-CM | POA: Diagnosis not present

## 2021-02-08 DIAGNOSIS — I255 Ischemic cardiomyopathy: Secondary | ICD-10-CM | POA: Diagnosis not present

## 2021-02-08 DIAGNOSIS — Z951 Presence of aortocoronary bypass graft: Secondary | ICD-10-CM | POA: Diagnosis not present

## 2021-02-08 DIAGNOSIS — I509 Heart failure, unspecified: Secondary | ICD-10-CM | POA: Diagnosis not present

## 2021-02-08 DIAGNOSIS — Z66 Do not resuscitate: Secondary | ICD-10-CM | POA: Diagnosis not present

## 2021-02-08 DIAGNOSIS — I25119 Atherosclerotic heart disease of native coronary artery with unspecified angina pectoris: Secondary | ICD-10-CM

## 2021-02-08 DIAGNOSIS — E785 Hyperlipidemia, unspecified: Secondary | ICD-10-CM

## 2021-02-08 DIAGNOSIS — R64 Cachexia: Secondary | ICD-10-CM | POA: Diagnosis not present

## 2021-02-08 DIAGNOSIS — I5084 End stage heart failure: Secondary | ICD-10-CM | POA: Diagnosis not present

## 2021-02-08 DIAGNOSIS — R0602 Shortness of breath: Secondary | ICD-10-CM | POA: Diagnosis not present

## 2021-02-08 DIAGNOSIS — J90 Pleural effusion, not elsewhere classified: Secondary | ICD-10-CM | POA: Diagnosis not present

## 2021-02-08 DIAGNOSIS — Z8673 Personal history of transient ischemic attack (TIA), and cerebral infarction without residual deficits: Secondary | ICD-10-CM | POA: Diagnosis not present

## 2021-02-08 DIAGNOSIS — Z95810 Presence of automatic (implantable) cardiac defibrillator: Secondary | ICD-10-CM | POA: Diagnosis not present

## 2021-02-08 DIAGNOSIS — I5021 Acute systolic (congestive) heart failure: Secondary | ICD-10-CM | POA: Diagnosis not present

## 2021-02-08 DIAGNOSIS — I2510 Atherosclerotic heart disease of native coronary artery without angina pectoris: Secondary | ICD-10-CM | POA: Diagnosis not present

## 2021-02-08 DIAGNOSIS — I472 Ventricular tachycardia, unspecified: Secondary | ICD-10-CM | POA: Diagnosis not present

## 2021-02-08 DIAGNOSIS — Z9049 Acquired absence of other specified parts of digestive tract: Secondary | ICD-10-CM | POA: Diagnosis not present

## 2021-02-08 DIAGNOSIS — I252 Old myocardial infarction: Secondary | ICD-10-CM | POA: Diagnosis not present

## 2021-02-08 DIAGNOSIS — D6959 Other secondary thrombocytopenia: Secondary | ICD-10-CM | POA: Diagnosis not present

## 2021-02-08 DIAGNOSIS — Z20822 Contact with and (suspected) exposure to covid-19: Secondary | ICD-10-CM | POA: Diagnosis not present

## 2021-02-08 DIAGNOSIS — Z955 Presence of coronary angioplasty implant and graft: Secondary | ICD-10-CM | POA: Diagnosis not present

## 2021-02-08 DIAGNOSIS — Z7189 Other specified counseling: Secondary | ICD-10-CM | POA: Diagnosis not present

## 2021-02-08 DIAGNOSIS — I4821 Permanent atrial fibrillation: Secondary | ICD-10-CM | POA: Diagnosis not present

## 2021-02-08 DIAGNOSIS — I517 Cardiomegaly: Secondary | ICD-10-CM | POA: Diagnosis not present

## 2021-02-08 DIAGNOSIS — K219 Gastro-esophageal reflux disease without esophagitis: Secondary | ICD-10-CM | POA: Diagnosis not present

## 2021-02-08 DIAGNOSIS — I5023 Acute on chronic systolic (congestive) heart failure: Secondary | ICD-10-CM | POA: Diagnosis not present

## 2021-02-08 DIAGNOSIS — Z681 Body mass index (BMI) 19 or less, adult: Secondary | ICD-10-CM | POA: Diagnosis not present

## 2021-02-08 DIAGNOSIS — I110 Hypertensive heart disease with heart failure: Secondary | ICD-10-CM | POA: Diagnosis not present

## 2021-02-08 DIAGNOSIS — N1831 Chronic kidney disease, stage 3a: Secondary | ICD-10-CM

## 2021-02-08 DIAGNOSIS — E871 Hypo-osmolality and hyponatremia: Secondary | ICD-10-CM | POA: Diagnosis not present

## 2021-02-08 DIAGNOSIS — I5043 Acute on chronic combined systolic (congestive) and diastolic (congestive) heart failure: Secondary | ICD-10-CM | POA: Diagnosis not present

## 2021-02-08 DIAGNOSIS — Z85038 Personal history of other malignant neoplasm of large intestine: Secondary | ICD-10-CM | POA: Diagnosis not present

## 2021-02-08 LAB — CBC
HCT: 40.5 % (ref 39.0–52.0)
Hemoglobin: 12.7 g/dL — ABNORMAL LOW (ref 13.0–17.0)
MCH: 30 pg (ref 26.0–34.0)
MCHC: 31.4 g/dL (ref 30.0–36.0)
MCV: 95.7 fL (ref 80.0–100.0)
Platelets: 127 10*3/uL — ABNORMAL LOW (ref 150–400)
RBC: 4.23 MIL/uL (ref 4.22–5.81)
RDW: 20.5 % — ABNORMAL HIGH (ref 11.5–15.5)
WBC: 5.4 10*3/uL (ref 4.0–10.5)
nRBC: 0 % (ref 0.0–0.2)

## 2021-02-08 LAB — BASIC METABOLIC PANEL
Anion gap: 7 (ref 5–15)
BUN: 26 mg/dL — ABNORMAL HIGH (ref 8–23)
CO2: 24 mmol/L (ref 22–32)
Calcium: 8.3 mg/dL — ABNORMAL LOW (ref 8.9–10.3)
Chloride: 103 mmol/L (ref 98–111)
Creatinine, Ser: 1.37 mg/dL — ABNORMAL HIGH (ref 0.61–1.24)
GFR, Estimated: 52 mL/min — ABNORMAL LOW (ref >60)
Glucose, Bld: 97 mg/dL (ref 70–99)
Potassium: 5.1 mmol/L (ref 3.5–5.1)
Sodium: 134 mmol/L — ABNORMAL LOW (ref 135–145)

## 2021-02-08 LAB — PROTIME-INR
INR: 2.8 — ABNORMAL HIGH (ref 0.8–1.2)
Prothrombin Time: 29.7 seconds — ABNORMAL HIGH (ref 11.4–15.2)

## 2021-02-08 LAB — ECHOCARDIOGRAM COMPLETE
AR max vel: 2.02 cm2
AV Area VTI: 1.86 cm2
AV Area mean vel: 1.68 cm2
AV Mean grad: 1.5 mmHg
AV Peak grad: 3 mmHg
Ao pk vel: 0.86 m/s
Area-P 1/2: 4.89 cm2
Height: 71 in
MV VTI: 1.48 cm2
S' Lateral: 6.8 cm
Weight: 2458.57 oz

## 2021-02-08 LAB — BRAIN NATRIURETIC PEPTIDE: B Natriuretic Peptide: 2099.8 pg/mL — ABNORMAL HIGH (ref 0.0–100.0)

## 2021-02-08 LAB — PHOSPHORUS: Phosphorus: 3.3 mg/dL (ref 2.5–4.6)

## 2021-02-08 MED ORDER — SODIUM CHLORIDE 0.9 % IV SOLN: 250.0000 mL | INTRAVENOUS | Status: DC | PRN

## 2021-02-08 MED ORDER — FUROSEMIDE 10 MG/ML IJ SOLN
40.0000 mg | Freq: Two times a day (BID) | INTRAMUSCULAR | Status: DC
  Administered 2021-02-08 – 2021-02-11 (×7): 40 mg via INTRAVENOUS
  Filled 2021-02-08 (×8): qty 4

## 2021-02-08 MED ORDER — PANTOPRAZOLE SODIUM 40 MG PO TBEC
40.0000 mg | DELAYED_RELEASE_TABLET | Freq: Every day | ORAL | Status: DC
  Administered 2021-02-08 – 2021-02-19 (×12): 40 mg via ORAL
  Filled 2021-02-08 (×12): qty 1

## 2021-02-08 MED ORDER — FERROUS SULFATE 325 (65 FE) MG PO TABS
325.0000 mg | ORAL_TABLET | Freq: Every day | ORAL | Status: DC
  Administered 2021-02-09 – 2021-02-19 (×11): 325 mg via ORAL
  Filled 2021-02-08 (×12): qty 1

## 2021-02-08 MED ORDER — SODIUM CHLORIDE 0.9% FLUSH
3.0000 mL | INTRAVENOUS | Status: DC | PRN
  Administered 2021-02-16: 3 mL via INTRAVENOUS

## 2021-02-08 MED ORDER — PERFLUTREN LIPID MICROSPHERE
1.0000 mL | INTRAVENOUS | Status: AC | PRN
  Administered 2021-02-08: 4 mL via INTRAVENOUS
  Filled 2021-02-08: qty 10

## 2021-02-08 MED ORDER — CARVEDILOL 3.125 MG PO TABS
3.1250 mg | ORAL_TABLET | Freq: Two times a day (BID) | ORAL | Status: DC
  Administered 2021-02-08 – 2021-02-19 (×21): 3.125 mg via ORAL
  Filled 2021-02-08 (×21): qty 1

## 2021-02-08 MED ORDER — SENNOSIDES-DOCUSATE SODIUM 8.6-50 MG PO TABS
2.0000 | ORAL_TABLET | Freq: Every day | ORAL | Status: DC
  Administered 2021-02-08 – 2021-02-15 (×8): 2 via ORAL
  Filled 2021-02-08 (×15): qty 2

## 2021-02-08 MED ORDER — RIVAROXABAN 15 MG PO TABS
15.0000 mg | ORAL_TABLET | Freq: Every day | ORAL | Status: DC
  Administered 2021-02-08 – 2021-02-18 (×11): 15 mg via ORAL
  Filled 2021-02-08 (×11): qty 1

## 2021-02-08 MED ORDER — RIVAROXABAN 20 MG PO TABS: 20.0000 mg | ORAL_TABLET | Freq: Every evening | ORAL | Status: DC

## 2021-02-08 MED ORDER — ONDANSETRON HCL 4 MG/2ML IJ SOLN: 4.0000 mg | Freq: Four times a day (QID) | INTRAMUSCULAR | Status: DC | PRN

## 2021-02-08 MED ORDER — DIVALPROEX SODIUM ER 500 MG PO TB24
500.0000 mg | ORAL_TABLET | Freq: Every day | ORAL | Status: DC
  Administered 2021-02-08 – 2021-02-17 (×10): 500 mg via ORAL
  Filled 2021-02-08 (×12): qty 1

## 2021-02-08 MED ORDER — ATORVASTATIN CALCIUM 80 MG PO TABS
80.0000 mg | ORAL_TABLET | Freq: Every day | ORAL | Status: DC
  Administered 2021-02-08 – 2021-02-17 (×10): 80 mg via ORAL
  Filled 2021-02-08 (×11): qty 1

## 2021-02-08 MED ORDER — ACETAMINOPHEN 325 MG PO TABS
650.0000 mg | ORAL_TABLET | ORAL | Status: DC | PRN
  Administered 2021-02-09 – 2021-02-17 (×5): 650 mg via ORAL
  Filled 2021-02-08 (×5): qty 2

## 2021-02-08 MED ORDER — FUROSEMIDE 10 MG/ML IJ SOLN
60.0000 mg | Freq: Once | INTRAMUSCULAR | Status: AC
  Administered 2021-02-08: 60 mg via INTRAVENOUS
  Filled 2021-02-08: qty 6

## 2021-02-08 MED ORDER — TRAZODONE HCL 100 MG PO TABS
100.0000 mg | ORAL_TABLET | Freq: Every day | ORAL | Status: DC
  Administered 2021-02-08 – 2021-02-17 (×10): 100 mg via ORAL
  Filled 2021-02-08 (×11): qty 1

## 2021-02-08 MED ORDER — SODIUM CHLORIDE 0.9% FLUSH
3.0000 mL | Freq: Two times a day (BID) | INTRAVENOUS | Status: DC
  Administered 2021-02-08 – 2021-02-19 (×17): 3 mL via INTRAVENOUS

## 2021-02-08 NOTE — Progress Notes (Signed)
*  PRELIMINARY RESULTS* Echocardiogram 2D Echocardiogram has been performed.  Mark Clayton 02/08/2021, 2:26 PM

## 2021-02-08 NOTE — H&P (Addendum)
History and Physical    Mark Clayton FIE:[id number] DOB: 09-Dec-1940 DOA: 02/07/2021  Referring MD/NP/PA: Mitzi Hansen, MD PCP: [name], MD  Patient coming from: Home via EMS  Chief Complaint: Swelling  I have personally briefly reviewed patient's old medical records in [place]   HPI: Mark Clayton is a 80 y.o. male with medical history significant of HTN, HLD, PAF on chronic anticoagulation, s/p AICD, LV thrombus, ICM, CAD s/p CABG, CVA, colon cancer s/p sigmoid colon resection who presents with complaints of progressively worsening swelling over the last 2 -3 weeks.  Normally patient ambulates with the use of a rolling walker.  He had been having difficulty getting around at home due to symptoms.  Noted swelling all the way into his abdomen which was causing him discomfort.  Patient was seen last by his primary care provider on 10/31 due to symptoms.  He had increased his dose of Lasix from 40 to 60 mg daily for at least the last 2 weeks, but had not noticed any significant change.  Associated symptoms included orthopnea, wheezing(reported by daughter), nonproductive cough, and malaise.  Denies having any significant fever, chest pain, palpitations, shocks from AICD, or blood in stools.   ED Course: Upon admission into the emergency department patient was seen to be afebrile, pulse of 73-1 03, respirations 12-23, blood pressures 90/70 2-118/80, and O2 saturations maintained on room air.  Labs from 11/11 significant for hemoglobin 12.7, platelets 127, sodium 134, BUN 26, creatinine 1.37, INR 2.8, and BNP 2099.8.  Chest x-ray noted cardiomegaly with small bilateral pleural effusions.  Influenza and COVID-19 screening were negative.  Patient had been given 60 mg of Lasix IV x1 dose.  Review of Systems  Constitutional:  Positive for malaise/fatigue. Negative for fever.  HENT:  Positive for hearing loss.   Eyes:  Negative for pain and discharge.  Respiratory:  Positive  for cough and shortness of breath.   Cardiovascular:  Positive for leg swelling. Negative for chest pain and palpitations.  Gastrointestinal:  Positive for abdominal pain. Negative for blood in stool, constipation, nausea and vomiting.  Genitourinary:  Negative for dysuria and hematuria.  Musculoskeletal:  Negative for falls.  Neurological:  Positive for weakness. Negative for loss of consciousness.  Psychiatric/Behavioral:  Negative for substance abuse.    Past Medical History:  Diagnosis Date   AICD (automatic cardioverter/defibrillator) present 01/17/2003   Medtronic Maximo 7232CX ICD, serial #[id number]   Anemia 02-06-11   takes oral iron   Arthritis    hands, knees   CAD (coronary artery disease) 2003   a. h/o MI and CABG in 2003. b. s/p DES to SVG-RPDA-RPLB in 08/2014.   Cancer of sigmoid colon ([place]) 2012   a. s/p colon surgery.   Carotid bruit    Chronic systolic CHF (congestive heart failure) ([place])    a. EF 20% in 2014; b. 08/2017 Echo: EF 20-25%, diff HK, Gr3 DD. Triv AI. Mod MR. Sev dil LA. Mildly dil RV w/ mildly reduced RV fxn. Mildly dil RA. Mod TR. PASP 10mHg.   Cough    Exudative age-related macular degeneration of left eye with active choroidal neovascularization ([place]) 07/12/2019   Exudative age-related macular degeneration, right eye, with inactive choroidal neovascularization ([place]) 07/12/2019   GERD (gastroesophageal reflux disease) 02-06-11   HTN (hypertension)    Hyperlipidemia    Ischemic cardiomyopathy    a. EF 20% in 2014. (Master study EF >20%); b. 08/2017 Echo: EF 20-25%, diff HK. Gr3 DD.  LV (left ventricular) mural thrombus    a. 12/2012 Echo: EF 20% with mural thrombus No evidence of thrombus on 08/2017 echo.   Macular degeneration    Myocardial infarct ([place])    2003   PAF (paroxysmal atrial fibrillation) ([facility])    a. CHA2DS2VASc = 5-->Xarelto/Tikosyn.   Ventricular tachycardia 11/05/2020    Past Surgical History:  Procedure Laterality Date   CARDIAC  CATHETERIZATION N/A 09/20/2014   Procedure: Left Heart Cath and Coronary Angiography;  Surgeon: Jettie Booze, MD; LAD 95%, D1 100%, CFX liner percent, OM 200%, OM 390%, RCA 90%, LIMA-LAD okay, SVG-OM 2-OM 3 minimal disease, SVG-RPDA-RPLB 100% between the RPDA and RPL      CARDIAC CATHETERIZATION N/A 09/20/2014   Procedure: Coronary Stent Intervention;  Surgeon: Jettie Booze, MD; Synergy DES 4 x 24 mm reducing the stenosis to 5%    CARDIAC DEFIBRILLATOR PLACEMENT  01/17/03   6949 lead. Medtronic. remote-no; with later revision   CARDIOVERSION N/A 05/22/2016   Procedure: CARDIOVERSION;  Surgeon: Lelon Perla, MD;  Location: [facility] ENDOSCOPY;  Service: Cardiovascular;  Laterality: N/A;   CARDIOVERSION N/A 08/10/2017   Procedure: CARDIOVERSION;  Surgeon: Pixie Casino, MD;  Location: [facility] ENDOSCOPY;  Service: Cardiovascular;  Laterality: N/A;   CATARACT EXTRACTION W/ INTRAOCULAR LENS  IMPLANT, BILATERAL Bilateral June/-July 2009   Dr. Katy Fitch   CATARACT EXTRACTION W/PHACO Bilateral 2010   Dr. Katy Fitch   COLON RESECTION  02/09/2011   Procedure: LAPAROSCOPIC SIGMOID COLON RESECTION;  Surgeon: Pedro Earls, MD;  Location: [facility] ORS;  Service: General;  Laterality: N/A;  Laparoscopic Assisted Sigmoid Colectomy   COLON SURGERY     COLONOSCOPY  08/31/2011   Procedure: COLONOSCOPY;  Surgeon: Jerene Bears, MD;  Location: [facility] ENDOSCOPY;  Service: Gastroenterology;  Laterality: N/A;   COLONOSCOPY N/A 09/05/2012   Procedure: COLONOSCOPY;  Surgeon: Jerene Bears, MD;  Location: [facility] ENDOSCOPY;  Service: Gastroenterology;  Laterality: N/A;   COLONOSCOPY N/A 04/18/2013   Procedure: COLONOSCOPY;  Surgeon: Jerene Bears, MD;  Location: [facility] ENDOSCOPY;  Service: Gastroenterology;  Laterality: N/A;   COLONOSCOPY N/A 04/09/2014   Procedure: COLONOSCOPY;  Surgeon: Jerene Bears, MD;  Location: [facility] ENDOSCOPY;  Service: Gastroenterology;  Laterality: N/A;   COLONOSCOPY WITH PROPOFOL N/A 05/03/2017   Procedure: COLONOSCOPY  WITH PROPOFOL;  Surgeon: Jerene Bears, MD;  Location: [facility] ENDOSCOPY;  Service: Gastroenterology;  Laterality: N/A;   CORONARY ANGIOPLASTY     CORONARY ARTERY BYPASS GRAFT  01/2002   LIMA-LAD, SVG-OM 2-OM 3, SVG-RPDA-RPLB   CORONARY STENT INTERVENTION N/A 11/22/2018   Procedure: CORONARY STENT INTERVENTION;  Surgeon: Nelva Bush, MD;  Location: [facility];  Service: Cardiovascular;  Laterality: N/A;  SVG - RCA   ICD GENERATOR CHANGE  2010   Medtronic Virtuoso II VR ICD   ICD GENERATOR CHANGEOUT N/A 12/07/2018   Procedure: ICD GENERATOR CHANGEOUT;  Surgeon: Deboraha Sprang, MD;  Location: [facility];  Service: Cardiovascular;  Laterality: N/A;   INGUINAL HERNIA REPAIR Right 2000's X 2   LAPAROSCOPIC RIGHT HEMI COLECTOMY N/A 11/04/2012   Procedure: LAPAROSCOPIC RIGHT HEMI COLECTOMY;  Surgeon: Pedro Earls, MD;  Location: [facility];  Service: General;  Laterality: N/A;   LEFT HEART CATH AND CORS/GRAFTS ANGIOGRAPHY Left 11/22/2018   Procedure: LEFT HEART CATH AND CORS/GRAFTS ANGIOGRAPHY;  Surgeon: Nelva Bush, MD;  Location: [facility] [facility];  Service: Cardiovascular;  Laterality: Left;   TONSILLECTOMY  ~ 1950     reports that  he has never smoked. He has never used smokeless tobacco. He reports that he does not currently use alcohol after a past usage of about 2.0 standard drinks per week. He reports that he does not use drugs.  Allergies  Allergen Reactions   Buspar [Buspirone] Other (See Comments)    Fatigue, body aches and shortness of breath    Latex Rash   Tape Rash and Other (See Comments)    USE PAPER    Family History  Problem Relation Age of Onset   Hypertension Father    Hyperlipidemia Father    Heart disease Father    Prostate cancer Father    Alzheimer's disease Mother    Hypertension Sister    Hyperlipidemia Sister    Colon cancer Neg Hx    Esophageal cancer Neg Hx    Rectal cancer Neg Hx    Stomach cancer Neg Hx     Prior to Admission  medications   Medication Sig Start Date End Date Taking? Authorizing Provider  acetaminophen (TYLENOL) 500 MG tablet Take 500 mg by mouth every 6 (six) hours as needed for moderate pain or headache.   Yes [provider]  atorvastatin (LIPITOR) 80 MG tablet TAKE 1 TABLET BY MOUTH  DAILY Patient taking differently: Take 80 mg by mouth at bedtime. 05/03/20  Yes Jerline Pain, MD  bisacodyl (DULCOLAX) 10 MG suppository Place 10 mg rectally See admin instructions. Qd on Monday and Thursday as needed for constipation   Yes [provider]  carvedilol (COREG) 3.125 MG tablet Take 1 tablet (3.125 mg total) by mouth 2 (two) times daily with a meal. Patient taking differently: Take 3.125 mg by mouth 2 (two) times daily with a meal. Hold for sbp<100 or hr<60 11/21/20  Yes Adhikari, Tamsen Meek, MD  divalproex (DEPAKOTE ER) 250 MG 24 hr tablet Take 2 tablets every night Patient taking differently: Take 500 mg by mouth at bedtime. 01/20/21  Yes Cameron Sprang, MD  ferrous sulfate 325 (65 FE) MG tablet Take 1 tablet (325 mg total) by mouth 2 (two) times daily with a meal. Patient taking differently: Take 325 mg by mouth daily with breakfast. 11/21/20  Yes Shelly Coss, MD  furosemide (LASIX) 40 MG tablet Take 1.5 tablets (60 mg total) by mouth 2 (two) times daily. Patient taking differently: Take 40 mg by mouth daily. 11/05/20  Yes Jerline Pain, MD  meclizine (ANTIVERT) 25 MG tablet Take 12.5 mg by mouth daily as needed for dizziness.   Yes [provider]  nitroGLYCERIN (NITROSTAT) 0.4 MG SL tablet DISSOLVE 1 TABLET UNDER THE TONGUE EVERY 5 MINUTES AS  NEEDED FOR CHEST PAIN. MAX  OF 3 TABLETS IN 15 MINUTES. CALL 911 IF PAIN PERSISTS. Patient taking differently: Place 0.4 mg under the tongue every 5 (five) minutes as needed for chest pain. 12/31/20  Yes Jerline Pain, MD  pantoprazole (PROTONIX) 40 MG tablet TAKE 1 TABLET BY MOUTH  DAILY Patient taking differently: Take 40 mg by mouth  daily. 07/17/20  Yes [name], MD  polyethylene glycol (MIRALAX / GLYCOLAX) 17 g packet Take 17 g by mouth daily as needed for mild constipation.   Yes [provider]  potassium chloride (KLOR-CON) 10 MEQ tablet Take 20 mEq by mouth every other day.   Yes [provider]  rivaroxaban (XARELTO) 20 MG TABS tablet Take 20 mg by mouth every evening.   Yes [provider]  sennosides-docusate sodium (SENOKOT-S) 8.6-50 MG tablet Take 2  tablets by mouth at bedtime.   Yes [provider]  traZODone (DESYREL) 50 MG tablet Take 2 tablet every night Patient taking differently: Take 100 mg by mouth at bedtime. 02/04/21  Yes Cameron Sprang, MD  vitamin B-12 (CYANOCOBALAMIN) 1000 MCG tablet Take 1,000 mcg by mouth daily.   Yes [provider]  baclofen (LIORESAL) 10 MG tablet Take 1 tablet (10 mg total) by mouth 2 (two) times daily as needed for muscle spasms. Patient not taking: Reported on 02/08/2021 11/12/20   [name], MD    Physical Exam:  Constitutional: Elderly male who appears to be no acute distress Vitals:   02/08/21 0345 02/08/21 0415 02/08/21 0456 02/08/21 0459  BP: 116/84 112/69  114/76  Pulse: 78 79  91  Resp: 17 18  18   Temp:  (!) 97.5 F (36.4 C)  (!) 97.3 F (36.3 C)  TempSrc:  Oral  Oral  SpO2: 99% 100%  96%  Weight:   69.7 kg   Height:   5\' 11"  (1.803 m)    Eyes: PERRL, lids and conjunctivae normal ENMT: Mucous membranes are moist. Posterior pharynx clear of any exudate or lesions.  Hard of hearing. Neck: normal, supple, no masses, no thyromegaly.  Mild JVD appreciated. Respiratory: Normal respiratory effort without significant wheezes or rhonchi appreciated. Cardiovascular: Irregular irregular positive systolic murmur appreciated. 3+ pitting lower edema. Abdomen: Mild abdominal distention with some pitting edema appreciated.  Bowel sounds present all 4 quadrants. Musculoskeletal: no clubbing / cyanosis. No  joint deformity upper and lower extremities. Good ROM, no contractures. Normal muscle tone.  Skin: no rashes, lesions, ulcers. No induration Neurologic: CN 2-12 grossly intact. Sensation intact, DTR normal. Strength 5/5 in all 4.  Psychiatric: Normal judgment and insight. Alert and oriented x 3. Normal mood.     Labs on Admission: I have personally reviewed following labs and imaging studies  CBC: Recent Labs  Lab 02/07/21 2227  WBC 5.4  HGB 12.7*  HCT 40.5  MCV 95.7  PLT 229*   Basic Metabolic Panel: Recent Labs  Lab 02/07/21 2227 02/07/21 2253  NA 134*  --   K 5.1  --   CL 103  --   CO2 24  --   GLUCOSE 97  --   BUN 26*  --   CREATININE 1.37*  --   CALCIUM 8.3*  --   PHOS  --  3.3   GFR: Estimated Creatinine Clearance: 42.4 mL/min (A) (by C-G formula based on SCr of 1.37 mg/dL (H)). Liver Function Tests: No results for input(s): AST, ALT, ALKPHOS, BILITOT, PROT, ALBUMIN in the last 168 hours. No results for input(s): LIPASE, AMYLASE in the last 168 hours. No results for input(s): AMMONIA in the last 168 hours. Coagulation Profile: Recent Labs  Lab 02/07/21 2227  INR 2.8*   Cardiac Enzymes: No results for input(s): CKTOTAL, CKMB, CKMBINDEX, TROPONINI in the last 168 hours. BNP (last 3 results) Recent Labs    01/27/21 1144  PROBNP 1,995.0*   HbA1C: No results for input(s): HGBA1C in the last 72 hours. CBG: No results for input(s): GLUCAP in the last 168 hours. Lipid Profile: No results for input(s): CHOL, HDL, LDLCALC, TRIG, CHOLHDL, LDLDIRECT in the last 72 hours. Thyroid Function Tests: No results for input(s): TSH, T4TOTAL, FREET4, T3FREE, THYROIDAB in the last 72 hours. Anemia Panel: No results for input(s): VITAMINB12, FOLATE, FERRITIN, TIBC, IRON, RETICCTPCT in the last 72 hours. Urine analysis:    Component Value Date/Time  COLORURINE YELLOW 11/13/2020 2027   APPEARANCEUR CLEAR 11/13/2020 2027   LABSPEC 1.010 11/13/2020 2027   PHURINE 5.0  11/13/2020 2027   GLUCOSEU NEGATIVE 11/13/2020 2027   HGBUR SMALL (A) 11/13/2020 2027   BILIRUBINUR NEGATIVE 11/13/2020 2027   KETONESUR NEGATIVE 11/13/2020 2027   PROTEINUR NEGATIVE 11/13/2020 2027   UROBILINOGEN 1.0 12/11/2008 1150   NITRITE POSITIVE (A) 11/13/2020 2027   LEUKOCYTESUR SMALL (A) 11/13/2020 2027   Sepsis Labs: Recent Results (from the past 240 hour(s))  Resp Panel by RT-PCR (Flu A&B, Covid) Nasopharyngeal Swab     Status: None   Collection Time: 02/07/21 10:28 PM   Specimen: Nasopharyngeal Swab; Nasopharyngeal(NP) swabs in vial transport medium  Result Value Ref Range Status   SARS Coronavirus 2 by RT PCR NEGATIVE NEGATIVE Final    Comment: (NOTE) SARS-CoV-2 target nucleic acids are NOT DETECTED.  The SARS-CoV-2 RNA is generally detectable in upper respiratory specimens during the acute phase of infection. The lowest concentration of SARS-CoV-2 viral copies this assay Clayton detect is 138 copies/mL. A negative result does not preclude SARS-Cov-2 infection and should not be used as the sole basis for treatment or other patient management decisions. A negative result may occur with  improper specimen collection/handling, submission of specimen other than nasopharyngeal swab, presence of viral mutation(s) within the areas targeted by this assay, and inadequate number of viral copies(<138 copies/mL). A negative result must be combined with clinical observations, patient history, and epidemiological information. The expected result is Negative.  Fact Sheet for Patients:  EntrepreneurPulse.com.au  Fact Sheet for Healthcare Providers:  IncredibleEmployment.be  This test is no t yet approved or cleared by the [place] FDA and  has been authorized for detection and/or diagnosis of SARS-CoV-2 by FDA under an Emergency Use Authorization (EUA). This EUA will remain  in effect (meaning this test Clayton be used) for the duration of  the COVID-19 declaration under Section 564(b)(1) of the Act, 21 U.S.C.section 360bbb-3(b)(1), unless the authorization is terminated  or revoked sooner.       Influenza A by PCR NEGATIVE NEGATIVE Final   Influenza B by PCR NEGATIVE NEGATIVE Final    Comment: (NOTE) The Xpert Xpress SARS-CoV-2/FLU/RSV plus assay is intended as an aid in the diagnosis of influenza from Nasopharyngeal swab specimens and should not be used as a sole basis for treatment. Nasal washings and aspirates are unacceptable for Xpert Xpress SARS-CoV-2/FLU/RSV testing.  Fact Sheet for Patients: EntrepreneurPulse.com.au  Fact Sheet for Healthcare Providers: IncredibleEmployment.be  This test is not yet approved or cleared by the [place] FDA and has been authorized for detection and/or diagnosis of SARS-CoV-2 by FDA under an Emergency Use Authorization (EUA). This EUA will remain in effect (meaning this test Clayton be used) for the duration of the COVID-19 declaration under Section 564(b)(1) of the Act, 21 U.S.C. section 360bbb-3(b)(1), unless the authorization is terminated or revoked.  Performed at [facility], [place] [address], [place], [place] [address]      Radiological Exams on Admission: DG Chest 2 View  Result Date: 02/07/2021 CLINICAL DATA:  Shortness of breath, CHF. EXAM: CHEST - 2 VIEW COMPARISON:  Chest x-ray 11/13/2020. FINDINGS: Left-sided pacemaker is present. Patient is status post cardiac surgery. The heart is enlarged, unchanged. There are small bilateral pleural effusions, left greater than right. There is no lung consolidation or pneumothorax. No acute fractures are seen. IMPRESSION: 1. Cardiomegaly with small bilateral pleural effusions. Electronically Signed   By: Tina Griffiths.D.  On: 02/07/2021 23:13    EKG: Independently reviewed.  Atrial fibrillation 88 bpm with left bundle branch with QTc 549  Assessment/Plan Acute on chronic  systolic congestive heart failure: Patient presents with progressively worsening swelling going up into his abdomen.  On physical exam patient with at least 3+ pitting edema bilateral lower extremities labs significant for BNP of 2099.8.  Chest x-ray noted cardiomegaly with small bilateral pleural effusions.  Last echocardiogram noted EF of less than 20% with global hypokinesis in 05/2019.  Followed by Dr. Marlou Porch in the outpatient setting of cardiology.  He was initially given 60 mg Lasix IV in the ED with output of at least 1 L and improvement in symptoms.  He appears to be in end-stage heart failure. -Admit to a cardiac telemetry bed -Strict I&O's and daily weight -Lasix 40 mg IV twice daily -Continue low-dose Coreg as tolerated -Check echocardiogram -Cardiology consulted, will follow-up for any further recommendations -Palliative care consulted  Persistent atrial fibrillation on chronic anticoagulation: Patient continues to be in atrial fibrillation but appears relatively rate controlled. CHA2DS2-VASc score = at least 5. -Continue Xarelto and Coreg  Essential hypertension: On admission blood pressures were noted to be relatively soft 90/72-118/80.  Home blood pressure medications include Coreg 3.125 mg twice daily with meals and furosemide 60 mg daily. -Continue Coreg  CAD s/p CABG: Last cardiac cath from 11/22/2018 noted multivessel coronary artery disease with successful PCI to SVG to RPDA with a drug-eluting stent. -Continue statin   History of VT ischemic cardiomyopathy s/p AICD -Orders placed to have AICD interrogated  Hyponatremia: Acute.  Sodium 131 on admission.  Suspect secondary to patient being fluid overload.   -Continue to monitor with diuresis  Episodes of altered awareness: Patient has been having intermittent staring spells.  Followed by Dr. Delice Lesch of neurology in outpatient setting. -Continue Depakote  Chronic kidney disease stage IIIa: Patient presents with creatinine  of 1.37 with BUN 26.  Baseline creatinine appears to range from 1-1.3. -Continue to monitor kidney function daily with diuresis  Hyperlipidemia -Continue atorvastatin  GERD -Continue Protonix  DNR: Present on admission.  Is followed by palliative care in the outpatient setting.  DVT prophylaxis: Xarelto Code Status: DNR Family Communication: Daughter updated at bedside Disposition Plan: TBD Consults called: Cardiology and palliative care Admission status: inpatient, requiring more than 2 midnight stay due to need of IV diuresis  Norval Morton MD [facility]   If 7PM-7AM, please contact night-coverage   02/08/2021, 8:06 AM

## 2021-02-08 NOTE — ED Provider Notes (Signed)
  Physical Exam  BP 105/75   Pulse 84   Temp (!) 96.3 F (35.7 C) (Axillary)   Resp (!) 21   SpO2 97%   Physical Exam  ED Course/Procedures     Procedures  MDM  Care assumed from Dr. Sherry Ruffing at shift change.  Patient awaiting response to Lasix.  Patient presented here with an apparent CHF exacerbation.  Laboratory studies and work-up thus far consistent with this.  Patient to be ambulated following diaphoresis to see if he is a candidate for outpatient therapy.  Patient reassessed after receiving Lasix.  He did have a 700 cc diuresis, but continues to describe discomfort in his legs and difficulty ambulating secondary to this.  Patient has been admitted in the past with similar presentations.  He does have a BNP of greater than 2000.  Patient to be admitted to the hospitalist service for further treatment.       Veryl Speak, MD 02/08/21 [phone]

## 2021-02-08 NOTE — Consult Note (Addendum)
Cardiology Consultation:   Patient ID: COLTRANE TUGWELL MRN: [id number]; DOB: [birth year]  Admit date: 02/07/2021 Date of Consult: 02/08/2021  PCP:  [name], MD   [facility] Providers Cardiologist:  Candee Furbish, MD  Electrophysiologist:  Virl Axe, MD       Patient Profile:   Mark Clayton is a 80 y.o. male with a hx of (HFrEF) heart failure with reduced ejection fraction due to ischemic CM with EF < 20, coronary artery disease s/p CABG in 2003, DES to the native RCA and DES to S-RPDA (jump graft to RPL chronically occluded), permanent atrial fibrillation on chronic anticoagulation, VT, s/p ICD, prior LV mural thrombus resolved on f/u studies, chronic kidney disease, DNR, hypertension, hyperlipidemia who is being seen 02/08/2021 for the evaluation of congestive heart failure at the request of Dr. Tamala Julian.  Prior CV Studies Echocardiogram 06/07/19 EF < 20, no LV thrombus, global HK, mod reduced RVSF, mod LAE, mild MR, mild AI  Cardiac catheterization 11/22/18 mRCA stent patent L-LAD patent S-RI/OM2/OM3 patent S-RPDA 95/jump graft to RPL CTO PCI: DES to S-RPDA  History of Present Illness:   Mr. Cotten was last seen by Dr. Marlou Porch in 8/22 for post hospital f/u.  The pt had been admitted with metabolic encephalopathy in the setting of UTI.  He remained weak/deconditioned at that time.  He had been DC to SNF.  He presented to the hospital on 02/08/2021 with symptoms of progressively worsening LE edema resistant to escalating doses of diuretics as an OP.  Cardiology is asked to further evaluate.    Mr. Grieshop notes increasing shortness of breath and lower extremity edema as well as orthopnea.  He has not had chest pain, syncope.  He has not had any shocks from his ICD.  He does feel that his leg swelling has improved with IV furosemide.  Data: Na 134, K 5.1, SCr 1.37, BNP 2099.8, Hgb 12.7, Plt 127K Flu A, Flu B, SARS-CoV-2 neg CXR: CM w small bilat pleural effusions   EKG: AFib, HR 88, Left Bundle Branch Block, PVCs BP: 90/72 - 118/80 Wt 153.66 lbs (69.7 kg)  [Wt at OV 11/26/20: 119 lbs (54 kg)]  Past Medical History:  Diagnosis Date   AICD (automatic cardioverter/defibrillator) present 01/17/2003   Medtronic Maximo 7232CX ICD, serial #[id number]   Anemia 02/06/2011   takes oral iron   Arthritis    hands, knees   CAD (coronary artery disease) 2003   h/o MI and CABG in 2003. // s/p DES to [place] in 08/2014 // s/p DES to S-RPDA in 10/2018 (Cath w patent RCA stent, L-LAD and S-RI/OM2/OM3; jump graft from RPDA to RPL w CTO)   Cancer of sigmoid colon ([place]) 2012   a. s/p colon surgery.   Carotid bruit    Cough    Exudative age-related macular degeneration of left eye with active choroidal neovascularization ([place]) 07/12/2019   Exudative age-related macular degeneration, right eye, with inactive choroidal neovascularization ([place]) 07/12/2019   GERD (gastroesophageal reflux disease) 02/06/2011   HFrEF (heart failure with reduced ejection fraction) ([place])    a. EF 20% in 2014; b. 08/2017 Echo: EF 20-25%, diff HK, Gr3 DD. Triv AI. Mod MR. Sev dil LA. Mildly dil RV w/ mildly reduced RV fxn. Mildly dil RA. Mod TR. PASP 63mHg. // Echo 3/21: EF < 20, mild MR, mild AI, mod LAE, mod reduced RVSF   HTN (hypertension)    Hyperlipidemia    Ischemic cardiomyopathy    a. EF 20% in  2014. (Master study EF >20%); b. 08/2017 Echo: EF 20-25%, diff HK. Gr3 DD.   LV (left ventricular) mural thrombus    a. 12/2012 Echo: EF 20% with mural thrombus No evidence of thrombus on 08/2017 echo.   Macular degeneration    Myocardial infarct ([place])    2003   PAF (paroxysmal atrial fibrillation) ([facility])    a. CHA2DS2VASc = 5-->Xarelto/Tikosyn.   Ventricular tachycardia 11/05/2020    Past Surgical History:  Procedure Laterality Date   CARDIAC CATHETERIZATION N/A 09/20/2014   Procedure: Left Heart Cath and Coronary Angiography;  Surgeon: Jettie Booze, MD; LAD 95%, D1 100%, CFX liner  percent, OM 200%, OM 390%, RCA 90%, LIMA-LAD okay, SVG-OM 2-OM 3 minimal disease, SVG-RPDA-RPLB 100% between the RPDA and RPL      CARDIAC CATHETERIZATION N/A 09/20/2014   Procedure: Coronary Stent Intervention;  Surgeon: Jettie Booze, MD; Synergy DES 4 x 24 mm reducing the stenosis to 5%    CARDIAC DEFIBRILLATOR PLACEMENT  01/17/03   6949 lead. Medtronic. remote-no; with later revision   CARDIOVERSION N/A 05/22/2016   Procedure: CARDIOVERSION;  Surgeon: Lelon Perla, MD;  Location: [facility] ENDOSCOPY;  Service: Cardiovascular;  Laterality: N/A;   CARDIOVERSION N/A 08/10/2017   Procedure: CARDIOVERSION;  Surgeon: Pixie Casino, MD;  Location: [facility] ENDOSCOPY;  Service: Cardiovascular;  Laterality: N/A;   CATARACT EXTRACTION W/ INTRAOCULAR LENS  IMPLANT, BILATERAL Bilateral June/-July 2009   Dr. Katy Fitch   CATARACT EXTRACTION W/PHACO Bilateral 2010   Dr. Katy Fitch   COLON RESECTION  02/09/2011   Procedure: LAPAROSCOPIC SIGMOID COLON RESECTION;  Surgeon: Pedro Earls, MD;  Location: [facility] ORS;  Service: General;  Laterality: N/A;  Laparoscopic Assisted Sigmoid Colectomy   COLON SURGERY     COLONOSCOPY  08/31/2011   Procedure: COLONOSCOPY;  Surgeon: Jerene Bears, MD;  Location: [facility] ENDOSCOPY;  Service: Gastroenterology;  Laterality: N/A;   COLONOSCOPY N/A 09/05/2012   Procedure: COLONOSCOPY;  Surgeon: Jerene Bears, MD;  Location: [facility] ENDOSCOPY;  Service: Gastroenterology;  Laterality: N/A;   COLONOSCOPY N/A 04/18/2013   Procedure: COLONOSCOPY;  Surgeon: Jerene Bears, MD;  Location: [facility] ENDOSCOPY;  Service: Gastroenterology;  Laterality: N/A;   COLONOSCOPY N/A 04/09/2014   Procedure: COLONOSCOPY;  Surgeon: Jerene Bears, MD;  Location: [facility] ENDOSCOPY;  Service: Gastroenterology;  Laterality: N/A;   COLONOSCOPY WITH PROPOFOL N/A 05/03/2017   Procedure: COLONOSCOPY WITH PROPOFOL;  Surgeon: Jerene Bears, MD;  Location: [facility] ENDOSCOPY;  Service: Gastroenterology;  Laterality: N/A;   CORONARY ANGIOPLASTY     CORONARY  ARTERY BYPASS GRAFT  01/2002   LIMA-LAD, SVG-OM 2-OM 3, SVG-RPDA-RPLB   CORONARY STENT INTERVENTION N/A 11/22/2018   Procedure: CORONARY STENT INTERVENTION;  Surgeon: Nelva Bush, MD;  Location: [place] [facility];  Service: Cardiovascular;  Laterality: N/A;  SVG - RCA   ICD GENERATOR CHANGE  2010   Medtronic Virtuoso II VR ICD   ICD GENERATOR CHANGEOUT N/A 12/07/2018   Procedure: ICD GENERATOR CHANGEOUT;  Surgeon: Deboraha Sprang, MD;  Location: [place] [facility];  Service: Cardiovascular;  Laterality: N/A;   INGUINAL HERNIA REPAIR Right 2000's X 2   LAPAROSCOPIC RIGHT HEMI COLECTOMY N/A 11/04/2012   Procedure: LAPAROSCOPIC RIGHT HEMI COLECTOMY;  Surgeon: Pedro Earls, MD;  Location: [facility];  Service: General;  Laterality: N/A;   LEFT HEART CATH AND CORS/GRAFTS ANGIOGRAPHY Left 11/22/2018   Procedure: LEFT HEART CATH AND CORS/GRAFTS ANGIOGRAPHY;  Surgeon: Nelva Bush, MD;  Location: [place] [facility];  Service:  Cardiovascular;  Laterality: Left;   TONSILLECTOMY  ~ 1950     Home Medications:  Prior to Admission medications   Medication Sig Start Date End Date Taking? Authorizing Provider  acetaminophen (TYLENOL) 500 MG tablet Take 500 mg by mouth every 6 (six) hours as needed for moderate pain or headache.   Yes [provider]  atorvastatin (LIPITOR) 80 MG tablet TAKE 1 TABLET BY MOUTH  DAILY Patient taking differently: Take 80 mg by mouth at bedtime. 05/03/20  Yes Jerline Pain, MD  bisacodyl (DULCOLAX) 10 MG suppository Place 10 mg rectally See admin instructions. Qd on Monday and Thursday as needed for constipation   Yes [provider]  carvedilol (COREG) 3.125 MG tablet Take 1 tablet (3.125 mg total) by mouth 2 (two) times daily with a meal. Patient taking differently: Take 3.125 mg by mouth 2 (two) times daily with a meal. Hold for sbp<100 or hr<60 11/21/20  Yes Adhikari, Tamsen Meek, MD  divalproex (DEPAKOTE ER) 250 MG 24 hr tablet Take 2 tablets every  night Patient taking differently: Take 500 mg by mouth at bedtime. 01/20/21  Yes Cameron Sprang, MD  ferrous sulfate 325 (65 FE) MG tablet Take 1 tablet (325 mg total) by mouth 2 (two) times daily with a meal. Patient taking differently: Take 325 mg by mouth daily with breakfast. 11/21/20  Yes Shelly Coss, MD  furosemide (LASIX) 40 MG tablet Take 1.5 tablets (60 mg total) by mouth 2 (two) times daily. Patient taking differently: Take 40 mg by mouth daily. 11/05/20  Yes Jerline Pain, MD  meclizine (ANTIVERT) 25 MG tablet Take 12.5 mg by mouth daily as needed for dizziness.   Yes [provider]  nitroGLYCERIN (NITROSTAT) 0.4 MG SL tablet DISSOLVE 1 TABLET UNDER THE TONGUE EVERY 5 MINUTES AS  NEEDED FOR CHEST PAIN. MAX  OF 3 TABLETS IN 15 MINUTES. CALL 911 IF PAIN PERSISTS. Patient taking differently: Place 0.4 mg under the tongue every 5 (five) minutes as needed for chest pain. 12/31/20  Yes Jerline Pain, MD  pantoprazole (PROTONIX) 40 MG tablet TAKE 1 TABLET BY MOUTH  DAILY Patient taking differently: Take 40 mg by mouth daily. 07/17/20  Yes [name], MD  polyethylene glycol (MIRALAX / GLYCOLAX) 17 g packet Take 17 g by mouth daily as needed for mild constipation.   Yes [provider]  potassium chloride (KLOR-CON) 10 MEQ tablet Take 20 mEq by mouth every other day.   Yes [provider]  rivaroxaban (XARELTO) 20 MG TABS tablet Take 20 mg by mouth every evening.   Yes [provider]  sennosides-docusate sodium (SENOKOT-S) 8.6-50 MG tablet Take 2 tablets by mouth at bedtime.   Yes [provider]  traZODone (DESYREL) 50 MG tablet Take 2 tablet every night Patient taking differently: Take 100 mg by mouth at bedtime. 02/04/21  Yes Cameron Sprang, MD  vitamin B-12 (CYANOCOBALAMIN) 1000 MCG tablet Take 1,000 mcg by mouth daily.   Yes [provider]  baclofen (LIORESAL) 10 MG tablet Take 1 tablet (10 mg total) by mouth 2 (two)  times daily as needed for muscle spasms. Patient not taking: Reported on 02/08/2021 11/12/20   [name], MD    Inpatient Medications: Scheduled Meds:  atorvastatin  80 mg Oral QHS   carvedilol  3.125 mg Oral BID WC   divalproex  500 mg Oral QHS   [START ON 02/09/2021] ferrous sulfate  325 mg Oral Q breakfast   furosemide  40 mg Intravenous BID   pantoprazole  40 mg Oral Daily   rivaroxaban  15 mg Oral Q supper   senna-docusate  2 tablet Oral QHS   sodium chloride flush  3 mL Intravenous Q12H   traZODone  100 mg Oral QHS   Continuous Infusions:  sodium chloride     PRN Meds: sodium chloride, acetaminophen, perflutren lipid microspheres (DEFINITY) IV suspension, sodium chloride flush  Allergies:    Allergies  Allergen Reactions   Buspar [Buspirone] Other (See Comments)    Fatigue, body aches and shortness of breath    Latex Rash   Tape Rash and Other (See Comments)    USE PAPER    Social History:   Social History   Socioeconomic History   Marital status: Widowed    Spouse name: Not on file   Number of children: 3   Years of education: Not on file   Highest education level: Not on file  Occupational History   Occupation: self employed/ Art gallery manager  Tobacco Use   Smoking status: Never   Smokeless tobacco: Never  Vaping Use   Vaping Use: Never used  Substance and Sexual Activity   Alcohol use: Not Currently    Alcohol/week: 2.0 standard drinks    Types: 2 Cans of beer per week   Drug use: No   Sexual activity: Not Currently  Other Topics Concern   Not on file  Social History Narrative   Full time. Married.    Right handed   Drinks caffeine   [facility]   Social Determinants of Health   Financial Resource Strain: Low Risk    Difficulty of Paying Living Expenses: Not hard at all  Food Insecurity: No Food Insecurity   Worried About [name] fundraiser in the Last Year: Never true   Arboriculturist in the Last Year: Never true   Transportation Needs: No Transportation Needs   Lack of Transportation (Medical): No   Lack of Transportation (Non-Medical): No  Physical Activity: Insufficiently Active   Days of Exercise per Week: 2 days   Minutes of Exercise per Session: 60 min  Stress: No Stress Concern Present   Feeling of Stress : Not at all  Social Connections: Not on file  Intimate Partner Violence: Not on file    Family History:    Family History  Problem Relation Age of Onset   Hypertension Father    Hyperlipidemia Father    Heart disease Father    Prostate cancer Father    Alzheimer's disease Mother    Hypertension Sister    Hyperlipidemia Sister    Colon cancer Neg Hx    Esophageal cancer Neg Hx    Rectal cancer Neg Hx    Stomach cancer Neg Hx      ROS:  Please see the history of present illness.  He has not had any melena, hematochezia. All other ROS reviewed and negative.     Physical Exam/Data:   Vitals:   02/08/21 0415 02/08/21 0456 02/08/21 0459 02/08/21 0939  BP: 112/69  114/76 101/74  Pulse: 79  91 76  Resp: 18  18   Temp: (!) 97.5 F (36.4 C)  (!) 97.3 F (36.3 C)   TempSrc: Oral  Oral   SpO2: 100%  96% 100%  Weight:  69.7 kg    Height:  5\' 11"  (1.803 m)      Intake/Output Summary (Last 24 hours) at 02/08/2021 1511 Last data filed at 02/08/2021  1013 Gross per 24 hour  Intake 243 ml  Output 1550 ml  Net -1307 ml   Last 3 Weights 02/08/2021 01/27/2021 01/20/2021  Weight (lbs) 153 lb 10.6 oz 144 lb 3.2 oz (No Data)  Weight (kg) 69.7 kg 65.409 kg (No Data)     Body mass index is 21.43 kg/m.  General: Chronically ill-appearing male in no acute distress HEENT: normal Neck: JVP ~10 cm ; + HJR Vascular: Distal pulses difficult to palp bilaterally Cardiac:  normal S1, S2; irregularly irregular rhythm; no obvious murmur  Lungs: Decreased breath sounds bilaterally, no obvious wheezing or rales Abd: soft, nontender, nondistended Ext: 2+ bilateral lower extremity edema up  to his thighs Musculoskeletal:  No deformities Skin: warm and dry  Neuro:  CNs 2-12 intact, no focal abnormalities noted Psych:  Normal affect   EKG:  The EKG was personally reviewed and demonstrates: See above Telemetry:  Telemetry was personally reviewed and demonstrates: Atrial fibrillation  Relevant CV Studies: Echocardiogram pending  Laboratory Data:  High Sensitivity Troponin:  No results for input(s): TROPONINIHS in the last 720 hours.   Chemistry Recent Labs  Lab 02/07/21 2227  NA 134*  K 5.1  CL 103  CO2 24  GLUCOSE 97  BUN 26*  CREATININE 1.37*  CALCIUM 8.3*  GFRNONAA 52*  ANIONGAP 7    No results for input(s): PROT, ALBUMIN, AST, ALT, ALKPHOS, BILITOT in the last 168 hours. Lipids No results for input(s): CHOL, TRIG, HDL, LABVLDL, LDLCALC, CHOLHDL in the last 168 hours.  Hematology Recent Labs  Lab 02/07/21 2227  WBC 5.4  RBC 4.23  HGB 12.7*  HCT 40.5  MCV 95.7  MCH 30.0  MCHC 31.4  RDW 20.5*  PLT 127*   Thyroid No results for input(s): TSH, FREET4 in the last 168 hours.  BNP Recent Labs  Lab 02/07/21 2254  BNP 2,099.8*    DDimer No results for input(s): DDIMER in the last 168 hours.   Radiology/Studies:  DG Chest 2 View  Result Date: 02/07/2021 CLINICAL DATA:  Shortness of breath, CHF. EXAM: CHEST - 2 VIEW COMPARISON:  Chest x-ray 11/13/2020. FINDINGS: Left-sided pacemaker is present. Patient is status post cardiac surgery. The heart is enlarged, unchanged. There are small bilateral pleural effusions, left greater than right. There is no lung consolidation or pneumothorax. No acute fractures are seen. IMPRESSION: 1. Cardiomegaly with small bilateral pleural effusions. Electronically Signed   By: Ronney Asters M.D.   On: 02/07/2021 23:13     Assessment and Plan:   1.  Acute on chronic HFrEF EF <20.  Ischemic cardiomyopathy.  NYHA IV.  He is significantly volume overloaded.  His weight is up more than 30 pounds since he was last seen in the  office in August.  His low blood pressure has limited titration of GDMT.  He is only on a small dose of carvedilol.  Agree with IV diuresis.  He is already -1.3 L since admission.  Continue furosemide 40 mg IV twice daily.  At this point, it does not appear that he needs inotropes.  We will follow along with you.  2.  Coronary artery disease History of CABG in 2003 and drug-eluting stent to the RCA in 2016 and subsequent drug-eluting stent to the vein graft to the RPDA in 2020.  He has not had chest pain to suggest angina.  He is not on antiplatelet therapy as he is on anticoagulation.  Continue atorvastatin 80 mg daily, carvedilol 3.125 mg twice daily.  3.  Permanent atrial fibrillation Heart rate seems to be well controlled.  He is tolerating anticoagulation.  Creatinine clearance is currently 42 mL/min.  Continue rivaroxaban 15 mg daily  4.  Chronic kidney disease  Recent creatinine 1.37.  This is overall fairly stable.  5.  DNR Internal medicine has consulted palliative care.  Risk Assessment/Risk Scores:        New York Heart Association (NYHA) Functional Class NYHA Class IV  CHA2DS2-VASc Score = 7   This indicates a 11.2% annual risk of stroke. The patient's score is based upon: CHF History: 1 HTN History: 1 Diabetes History: 0 Stroke History: 2 Vascular Disease History: 1 Age Score: 2 Gender Score: 0        For questions or updates, please contact [facility] Please consult [email or web address] for contact info under    Signed, Richardson Dopp, PA-C  02/08/2021 3:11 PM   Personally seen and examined. Agree with above.   80 year old with end-stage heart failure stage D ICD in place with prior CABG admitted with 40 pound weight gain gradually, worsening acute on chronic systolic heart failure.  Currently resting fairly comfortably in bed.  Appears cachectic.  JVD pronounced and pulsatile.  Lungs with crackles at bases.  Irregularly irregular normal  rate.  Echocardiogram-EF less than 20%.  Severely reduced.  In 2020 had PCI to his saphenous vein graft to PDA.  Acute on chronic systolic heart failure - IV diuresis currently with 40 twice daily of Lasix.  May require 80 twice daily.  Clearly end-stage heart failure.  Not a candidate for inotropic therapy.  Agree with DNR status.  Palliative care consultation makes sense.  Also with relative hypotension challenging to add further goal-directed medical therapy such as SGLT2 inhibitor, ARNI.  He is on very low-dose beta-blocker.  Coronary artery disease - CABG 2003-stent to RCA in 2016, SVG vein graft 2020.  No chest pain.  He is on Xarelto dose adjusted because of atrial fibrillation.  Permanent atrial fibrillation - Rate controlled.  Xarelto 15 mg for chronic anticoagulation based upon GFR of less than 50.  Chronic kidney disease stage IIIa - Creatinine 1.37.  Stable.  DNR - Agree.  Palliative care consultation.  He is at high risk for further hospitalizations.  Candee Furbish, MD

## 2021-02-09 DIAGNOSIS — Z95810 Presence of automatic (implantable) cardiac defibrillator: Secondary | ICD-10-CM | POA: Diagnosis not present

## 2021-02-09 DIAGNOSIS — I4819 Other persistent atrial fibrillation: Secondary | ICD-10-CM | POA: Diagnosis not present

## 2021-02-09 DIAGNOSIS — Z7189 Other specified counseling: Secondary | ICD-10-CM

## 2021-02-09 DIAGNOSIS — I5023 Acute on chronic systolic (congestive) heart failure: Secondary | ICD-10-CM | POA: Diagnosis not present

## 2021-02-09 LAB — CBC WITH DIFFERENTIAL/PLATELET
Abs Immature Granulocytes: 0.02 10*3/uL (ref 0.00–0.07)
Basophils Absolute: 0 10*3/uL (ref 0.0–0.1)
Basophils Relative: 1 %
Eosinophils Absolute: 0.1 10*3/uL (ref 0.0–0.5)
Eosinophils Relative: 3 %
HCT: 37.1 % — ABNORMAL LOW (ref 39.0–52.0)
Hemoglobin: 11.8 g/dL — ABNORMAL LOW (ref 13.0–17.0)
Immature Granulocytes: 0 %
Lymphocytes Relative: 21 %
Lymphs Abs: 1 10*3/uL (ref 0.7–4.0)
MCH: 30.3 pg (ref 26.0–34.0)
MCHC: 31.8 g/dL (ref 30.0–36.0)
MCV: 95.1 fL (ref 80.0–100.0)
Monocytes Absolute: 0.6 10*3/uL (ref 0.1–1.0)
Monocytes Relative: 13 %
Neutro Abs: 3 10*3/uL (ref 1.7–7.7)
Neutrophils Relative %: 62 %
Platelets: 121 10*3/uL — ABNORMAL LOW (ref 150–400)
RBC: 3.9 MIL/uL — ABNORMAL LOW (ref 4.22–5.81)
RDW: 19.9 % — ABNORMAL HIGH (ref 11.5–15.5)
WBC: 4.8 10*3/uL (ref 4.0–10.5)
nRBC: 0 % (ref 0.0–0.2)

## 2021-02-09 LAB — BASIC METABOLIC PANEL
Anion gap: 9 (ref 5–15)
BUN: 21 mg/dL (ref 8–23)
CO2: 25 mmol/L (ref 22–32)
Calcium: 8 mg/dL — ABNORMAL LOW (ref 8.9–10.3)
Chloride: 97 mmol/L — ABNORMAL LOW (ref 98–111)
Creatinine, Ser: 1.24 mg/dL (ref 0.61–1.24)
GFR, Estimated: 59 mL/min — ABNORMAL LOW (ref >60)
Glucose, Bld: 95 mg/dL (ref 70–99)
Potassium: 3.3 mmol/L — ABNORMAL LOW (ref 3.5–5.1)
Sodium: 131 mmol/L — ABNORMAL LOW (ref 135–145)

## 2021-02-09 MED ORDER — POTASSIUM CHLORIDE CRYS ER 20 MEQ PO TBCR
40.0000 meq | EXTENDED_RELEASE_TABLET | Freq: Once | ORAL | Status: AC
  Administered 2021-02-09: 40 meq via ORAL
  Filled 2021-02-09: qty 2

## 2021-02-09 NOTE — Progress Notes (Addendum)
PROGRESS NOTE    EMMONS TOTH  VOH:[id number] DOB: 04-Feb-1941 DOA: 02/07/2021 PCP: [name], MD   Brief Narrative:  HPI: Mark Clayton is a 80 y.o. male with medical history significant of HTN, HLD, PAF on chronic anticoagulation, s/p AICD, LV thrombus, ICM, CAD s/p CABG, CVA, colon cancer s/p sigmoid colon resection who presents with complaints of progressively worsening swelling over the last 2 -3 weeks.  Normally patient ambulates with the use of a rolling walker.  He had been having difficulty getting around at home due to symptoms.  Noted swelling all the way into his abdomen which was causing him discomfort.  Patient was seen last by his primary care provider on 10/31 due to symptoms.  He had increased his dose of Lasix from 40 to 60 mg daily for at least the last 2 weeks, but had not noticed any significant change.  Associated symptoms included orthopnea, wheezing(reported by daughter), nonproductive cough, and malaise.  Denies having any significant fever, chest pain, palpitations, shocks from AICD, or blood in stools.     ED Course: Upon admission into the emergency department patient was seen to be afebrile, pulse of 73-1 03, respirations 12-23, blood pressures 90/70 2-118/80, and O2 saturations maintained on room air.  Labs from 11/11 significant for hemoglobin 12.7, platelets 127, sodium 134, BUN 26, creatinine 1.37, INR 2.8, and BNP 2099.8.  Chest x-ray noted cardiomegaly with small bilateral pleural effusions.  Influenza and COVID-19 screening were negative.  Patient had been given 60 mg of Lasix IV x1 dose.    Assessment & Plan:   Principal Problem:   Acute on chronic systolic CHF (congestive heart failure) (HCC) Active Problems:   Hyperlipidemia   Cardiac defibrillator  MDT VVI   Persistent atrial fibrillation   Coronary artery disease involving native heart with angina pectoris [facility])   Essential hypertension   Chronic anticoagulation   CHF (congestive heart  failure) (HCC)  Acute on chronic combined systolic and diastolic congestive heart failure: BNP of 2099.8.  Chest x-ray noted cardiomegaly with small bilateral pleural effusions.  Last echocardiogram noted EF of less than 20% with global hypokinesis in 05/2019 and grade 2 diastolic dysfunction. He was initially given 60 mg Lasix IV in the ED with output of at least 1 L and improvement in symptoms.  Seen by cardiology.  Remains on Lasix IV 40 mg twice daily.  Still has +3 pitting edema.  Strict I's and O's, daily weight and low-sodium diet.  Echo with no change.  Appreciate cardiology for managing this.   Permanent atrial fibrillation on chronic anticoagulation: Rate controlled.  Continue Xarelto and Coreg   Essential hypertension: Blood pressure soft.  Continue home Coreg 3.125 mg p.o. twice daily.    CAD s/p CABG: Last cardiac cath from 11/22/2018 noted multivessel coronary artery disease with successful PCI to SVG to RPDA with a drug-eluting stent.  Asymptomatic.   History of VT ischemic cardiomyopathy s/p AICD -Orders placed to have AICD interrogated   Hyponatremia: Acute.  Sodium 131 on admission.  Suspect secondary to patient being fluid overload.  Treat CHF.   Chronic kidney disease stage IIIa: At baseline.   Hyperlipidemia -Continue atorvastatin   GERD -Continue Protonix   DVT prophylaxis:    Code Status: DNR  Family Communication:  None present at bedside.  Plan of care discussed with patient in length and he verbalized understanding and agreed with it.  Status is: Inpatient  Remains inpatient appropriate because: Needs further IV diuresis.  Estimated body mass index is 20.75 kg/m as calculated from the following:   Height as of this encounter: 5\' 11"  (1.803 m).   Weight as of this encounter: 67.5 kg.     Nutritional Assessment: Body mass index is 20.75 kg/m.Marland Kitchen Seen by dietician.  I agree with the assessment and plan as outlined below: Nutrition Status:  Skin  Assessment: I have examined the patient's skin and I agree with the wound assessment as performed by the wound care RN as outlined below:    Consultants:  Cardiology  Procedures:  None  Antimicrobials:  Anti-infectives (From admission, onward)    None          Subjective: Patient seen and examined.  He has no complaints.  No shortness of breath.  He is also concerned about his edema.  Objective: Vitals:   02/08/21 2049 02/09/21 0059 02/09/21 0515 02/09/21 0840  BP: 101/76 99/68 105/78 99/65  Pulse: 76 77 77 76  Resp: 16 16 16 15   Temp: (!) 97.5 F (36.4 C) 98.1 F (36.7 C) (!) 97.5 F (36.4 C) (!) 97.5 F (36.4 C)  TempSrc: Oral Oral Oral Oral  SpO2: 99% 100% 100% 100%  Weight:   67.5 kg   Height:        Intake/Output Summary (Last 24 hours) at 02/09/2021 1034 Last data filed at 02/09/2021 0519 Gross per 24 hour  Intake 240 ml  Output 1750 ml  Net -1510 ml   Filed Weights   02/08/21 0456 02/09/21 0515  Weight: 69.7 kg 67.5 kg    Examination:  General exam: Appears calm and comfortable, appears chronically sick Respiratory system: Clear to auscultation. Respiratory effort normal. Cardiovascular system: S1 & S2 heard, irregularly irregular rate and rhythm.  Elevated JVD, murmurs, rubs, gallops or clicks.  +3 pitting edema bilateral lower extremity Gastrointestinal system: Abdomen is nondistended, soft and nontender. No organomegaly or masses felt. Normal bowel sounds heard. Central nervous system: Alert and oriented. No focal neurological deficits. Extremities: Symmetric 5 x 5 power. Skin: No rashes, lesions or ulcers   Data Reviewed: I have personally reviewed following labs and imaging studies  CBC: Recent Labs  Lab 02/07/21 2227 02/09/21 0412  WBC 5.4 4.8  NEUTROABS  --  3.0  HGB 12.7* 11.8*  HCT 40.5 37.1*  MCV 95.7 95.1  PLT 127* 631*   Basic Metabolic Panel: Recent Labs  Lab 02/07/21 2227 02/07/21 2253 02/09/21 0412  NA 134*  --   131*  K 5.1  --  3.3*  CL 103  --  97*  CO2 24  --  25  GLUCOSE 97  --  95  BUN 26*  --  21  CREATININE 1.37*  --  1.24  CALCIUM 8.3*  --  8.0*  PHOS  --  3.3  --    GFR: Estimated Creatinine Clearance: 45.4 mL/min (by C-G formula based on SCr of 1.24 mg/dL). Liver Function Tests: No results for input(s): AST, ALT, ALKPHOS, BILITOT, PROT, ALBUMIN in the last 168 hours. No results for input(s): LIPASE, AMYLASE in the last 168 hours. No results for input(s): AMMONIA in the last 168 hours. Coagulation Profile: Recent Labs  Lab 02/07/21 2227  INR 2.8*   Cardiac Enzymes: No results for input(s): CKTOTAL, CKMB, CKMBINDEX, TROPONINI in the last 168 hours. BNP (last 3 results) Recent Labs    01/27/21 1144  PROBNP 1,995.0*   HbA1C: No results for input(s): HGBA1C in the last 72 hours. CBG: No results for input(s): GLUCAP  in the last 168 hours. Lipid Profile: No results for input(s): CHOL, HDL, LDLCALC, TRIG, CHOLHDL, LDLDIRECT in the last 72 hours. Thyroid Function Tests: No results for input(s): TSH, T4TOTAL, FREET4, T3FREE, THYROIDAB in the last 72 hours. Anemia Panel: No results for input(s): VITAMINB12, FOLATE, FERRITIN, TIBC, IRON, RETICCTPCT in the last 72 hours. Sepsis Labs: No results for input(s): PROCALCITON, LATICACIDVEN in the last 168 hours.  Recent Results (from the past 240 hour(s))  Resp Panel by RT-PCR (Flu A&B, Covid) Nasopharyngeal Swab     Status: None   Collection Time: 02/07/21 10:28 PM   Specimen: Nasopharyngeal Swab; Nasopharyngeal(NP) swabs in vial transport medium  Result Value Ref Range Status   SARS Coronavirus 2 by RT PCR NEGATIVE NEGATIVE Final    Comment: (NOTE) SARS-CoV-2 target nucleic acids are NOT DETECTED.  The SARS-CoV-2 RNA is generally detectable in upper respiratory specimens during the acute phase of infection. The lowest concentration of SARS-CoV-2 viral copies this assay can detect is 138 copies/mL. A negative result does not  preclude SARS-Cov-2 infection and should not be used as the sole basis for treatment or other patient management decisions. A negative result may occur with  improper specimen collection/handling, submission of specimen other than nasopharyngeal swab, presence of viral mutation(s) within the areas targeted by this assay, and inadequate number of viral copies(<138 copies/mL). A negative result must be combined with clinical observations, patient history, and epidemiological information. The expected result is Negative.  Fact Sheet for Patients:  EntrepreneurPulse.com.au  Fact Sheet for Healthcare Providers:  IncredibleEmployment.be  This test is no t yet approved or cleared by the [place] FDA and  has been authorized for detection and/or diagnosis of SARS-CoV-2 by FDA under an Emergency Use Authorization (EUA). This EUA will remain  in effect (meaning this test can be used) for the duration of the COVID-19 declaration under Section 564(b)(1) of the Act, 21 U.S.C.section 360bbb-3(b)(1), unless the authorization is terminated  or revoked sooner.       Influenza A by PCR NEGATIVE NEGATIVE Final   Influenza B by PCR NEGATIVE NEGATIVE Final    Comment: (NOTE) The Xpert Xpress SARS-CoV-2/FLU/RSV plus assay is intended as an aid in the diagnosis of influenza from Nasopharyngeal swab specimens and should not be used as a sole basis for treatment. Nasal washings and aspirates are unacceptable for Xpert Xpress SARS-CoV-2/FLU/RSV testing.  Fact Sheet for Patients: EntrepreneurPulse.com.au  Fact Sheet for Healthcare Providers: IncredibleEmployment.be  This test is not yet approved or cleared by the [place] FDA and has been authorized for detection and/or diagnosis of SARS-CoV-2 by FDA under an Emergency Use Authorization (EUA). This EUA will remain in effect (meaning this test can be used) for the  duration of the COVID-19 declaration under Section 564(b)(1) of the Act, 21 U.S.C. section 360bbb-3(b)(1), unless the authorization is terminated or revoked.  Performed at [facility], [place] [address] [place], [place] [address]       Radiology Studies: DG Chest 2 View  Result Date: 02/07/2021 CLINICAL DATA:  Shortness of breath, CHF. EXAM: CHEST - 2 VIEW COMPARISON:  Chest x-ray 11/13/2020. FINDINGS: Left-sided pacemaker is present. Patient is status post cardiac surgery. The heart is enlarged, unchanged. There are small bilateral pleural effusions, left greater than right. There is no lung consolidation or pneumothorax. No acute fractures are seen. IMPRESSION: 1. Cardiomegaly with small bilateral pleural effusions. Electronically Signed   By: Ronney Asters M.D.   On: 02/07/2021 23:13   ECHOCARDIOGRAM COMPLETE  Result Date: 02/08/2021    ECHOCARDIOGRAM REPORT   Patient Name:   STEELE STRACENER Date of Exam: 02/08/2021 Medical Rec #:  [id number]      Height:       71.0 in Accession #:    [id number]     Weight:       153.7 lb Date of Birth:  03/01/1941      BSA:          1.885 m Patient Age:    26 years       BP:           101/74 mmHg Patient Gender: M              HR:           76 bpm. Exam Location:  Inpatient Procedure: 2D Echo, Cardiac Doppler, Color Doppler and Intracardiac            Opacification Agent Indications:    CHF-Acute Systolic  History:        Patient has prior history of Echocardiogram examinations, most                 recent 06/07/2019. CHF and Cardiomyopathy, CAD and Previous                 Myocardial Infarction, Defibrillator, Stroke, Arrythmias:Atrial                 Fibrillation; Risk Factors:Hypertension and Dyslipidemia.  Sonographer:    Wenda Low Referring Phys: [id number] RONDELL A SMITH IMPRESSIONS  1. Left ventricular ejection fraction, by estimation, is <20%. The left ventricle has severely decreased function. The left ventricle demonstrates global  hypokinesis. The left ventricular internal cavity size was moderately dilated. Left ventricular diastolic parameters are consistent with Grade III diastolic dysfunction (restrictive).  2. Right ventricular systolic function is normal. The right ventricular size is moderately enlarged.  3. Left atrial size was severely dilated.  4. The mitral valve is normal in structure. Trivial mitral valve regurgitation. No evidence of mitral stenosis.  5. The aortic valve is tricuspid. There is moderate calcification of the aortic valve. There is moderate thickening of the aortic valve. Aortic valve regurgitation is trivial. Aortic valve sclerosis/calcification is present, without any evidence of aortic stenosis.  6. The inferior vena cava is dilated in size with <50% respiratory variability, suggesting right atrial pressure of 15 mmHg. Comparison(s): No significant change from prior study. Prior images reviewed side by side. FINDINGS  Left Ventricle: Left ventricular ejection fraction, by estimation, is <20%. The left ventricle has severely decreased function. The left ventricle demonstrates global hypokinesis. Definity contrast agent was given IV to delineate the left ventricular endocardial borders. The left ventricular internal cavity size was moderately dilated. There is no left ventricular hypertrophy. Left ventricular diastolic parameters are consistent with Grade III diastolic dysfunction (restrictive). Right Ventricle: The right ventricular size is moderately enlarged. No increase in right ventricular wall thickness. Right ventricular systolic function is normal. Left Atrium: Left atrial size was severely dilated. Right Atrium: Right atrial size was normal in size. Pericardium: There is no evidence of pericardial effusion. Mitral Valve: The mitral valve is normal in structure. Trivial mitral valve regurgitation. No evidence of mitral valve stenosis. MV peak gradient, 2.7 mmHg. The mean mitral valve gradient is 1.0 mmHg.  Tricuspid Valve: The tricuspid valve is normal in structure. Tricuspid valve regurgitation is mild . No evidence of tricuspid stenosis. Aortic Valve: The aortic valve is tricuspid. There is moderate calcification  of the aortic valve. There is moderate thickening of the aortic valve. Aortic valve regurgitation is trivial. Aortic valve sclerosis/calcification is present, without any evidence of aortic stenosis. Aortic valve mean gradient measures 1.5 mmHg. Aortic valve peak gradient measures 3.0 mmHg. Aortic valve area, by VTI measures 1.86 cm. Pulmonic Valve: The pulmonic valve was normal in structure. Pulmonic valve regurgitation is trivial. No evidence of pulmonic stenosis. Aorta: The aortic root is normal in size and structure. Venous: The inferior vena cava is dilated in size with less than 50% respiratory variability, suggesting right atrial pressure of 15 mmHg. IAS/Shunts: No atrial level shunt detected by color flow Doppler. Additional Comments: A device lead is visualized in the right ventricle.  LEFT VENTRICLE PLAX 2D LVIDd:         7.60 cm LVIDs:         6.80 cm LV PW:         0.70 cm LV IVS:        0.70 cm LVOT diam:     2.10 cm LV SV:         30 LV SV Index:   16 LVOT Area:     3.46 cm  RIGHT VENTRICLE RV Basal diam:  4.55 cm RV Mid diam:    3.60 cm RV S prime:     5.63 cm/s TAPSE (M-mode): 1.7 cm LEFT ATRIUM              Index        RIGHT ATRIUM           Index LA diam:        4.90 cm  2.60 cm/m   RA Area:     27.40 cm LA Vol (A2C):   97.3 ml  51.61 ml/m  RA Volume:   96.50 ml  51.19 ml/m LA Vol (A4C):   122.0 ml 64.72 ml/m LA Biplane Vol: 119.0 ml 63.13 ml/m  AORTIC VALVE                    PULMONIC VALVE AV Area (Vmax):    2.02 cm     PV Vmax:       0.43 m/s AV Area (Vmean):   1.68 cm     PV Peak grad:  0.7 mmHg AV Area (VTI):     1.86 cm AV Vmax:           86.20 cm/s AV Vmean:          57.250 cm/s AV VTI:            0.161 m AV Peak Grad:      3.0 mmHg AV Mean Grad:      1.5 mmHg LVOT Vmax:          50.35 cm/s LVOT Vmean:        27.700 cm/s LVOT VTI:          0.086 m LVOT/AV VTI ratio: 0.54  AORTA Ao Root diam: 3.10 cm MITRAL VALVE               TRICUSPID VALVE MV Area (PHT): 4.89 cm    TR Peak grad:   31.1 mmHg MV Area VTI:   1.48 cm    TR Vmax:        279.00 cm/s MV Peak grad:  2.7 mmHg MV Mean grad:  1.0 mmHg    SHUNTS MV Vmax:       0.83 m/s    Systemic VTI:  0.09  m MV Vmean:      43.6 cm/s   Systemic Diam: 2.10 cm MV Decel Time: 155 msec MV E velocity: 81.40 cm/s Candee Furbish MD Electronically signed by Candee Furbish MD Signature Date/Time: 02/08/2021/4:02:46 PM    Final     Scheduled Meds:  atorvastatin  80 mg Oral QHS   carvedilol  3.125 mg Oral BID WC   divalproex  500 mg Oral QHS   ferrous sulfate  325 mg Oral Q breakfast   furosemide  40 mg Intravenous BID   pantoprazole  40 mg Oral Daily   rivaroxaban  15 mg Oral Q supper   senna-docusate  2 tablet Oral QHS   sodium chloride flush  3 mL Intravenous Q12H   traZODone  100 mg Oral QHS   Continuous Infusions:  sodium chloride       LOS: 1 day   Time spent: 32 minutes   Darliss Cheney, MD [facility]  02/09/2021, 10:34 AM  Please page via Shea [facility] and do not message via secure chat for anything urgent. Secure chat can be used for anything non urgent.  How to contact the [facility] Attending or Consulting provider [place] or covering provider during after hours [place], for this patient?  Check the care team in [facility] and look for a) attending/consulting [facility] provider listed and b) the [facility] team listed. Page or secure chat [phone]. Log into [email or web address] and use [place]'s universal password to access. If you do not have the password, please contact the hospital operator. Locate the [facility] provider you are looking for under [facility] and page to a number that you can be directly reached. If you still have difficulty reaching the provider, please page the [facility] (Director on Call) for the Hospitalists listed on amion for  assistance.

## 2021-02-09 NOTE — Evaluation (Signed)
Physical Therapy Evaluation Patient Details Name: Mark Clayton MRN: [id number] DOB: 1940/09/22 Today's Date: 02/09/2021  History of Present Illness  Pt is an 80 y.o. male who presented 02/07/21 with lower extremity edema and swelling into his abdomen along with malaise, cough, orthopnea, and wheezing. Chest x-ray noted cardiomegaly with small bilateral pleural effusions. Pt admitted with acute on chronic systolic congestive heart failure. PMH: HTN, HLD, PAF on chronic anticoagulation, s/p AICD, LV thrombus, ICM, CAD s/p CABG, CVA, colon cancer s/p sigmoid colon resection   Clinical Impression  Pt presents with condition above and deficits mentioned below, see PT Problem List. PTA, he was living with his daughters with 24/7 care available in a house with a level entry. Pt was requiring assistance with majority of mobility and ADLs. Currently, pt displays deficits in lower extremity strength (R>L with hx of R sided weakness from prior CVA), balance, and activity tolerance. Pt is able to perform bedroom distance mobility with a RW without LOB at a min guard level at this time. Pt is likely not far from his baseline. Pt would benefit from further acute PT and follow-up with HHPT to maximize his independence and safety with all functional mobility and reduce his burden of care.       Recommendations for follow up therapy are one component of a multi-disciplinary discharge planning process, led by the attending physician.  Recommendations may be updated based on patient status, additional functional criteria and insurance authorization.  Follow Up Recommendations Home health PT    Assistance Recommended at Discharge Frequent or constant Supervision/Assistance  Functional Status Assessment Patient has had a recent decline in their functional status and demonstrates the ability to make significant improvements in function in a reasonable and predictable amount of time.  Equipment Recommendations  None  recommended by PT    Recommendations for Other Services OT consult     Precautions / Restrictions Precautions Precautions: Fall Restrictions Weight Bearing Restrictions: No      Mobility  Bed Mobility Overal bed mobility: Needs Assistance Bed Mobility: Supine to Sit;Sit to Supine     Supine to sit: Mod assist;HOB elevated Sit to supine: Mod assist   General bed mobility comments: Pt requires modA to manage R leg in and out of bed and modA to scoot hips to EOB when sitting up.    Transfers Overall transfer level: Needs assistance Equipment used: Rolling walker (2 wheels) Transfers: Sit to/from Stand Sit to Stand: Min guard;Min assist           General transfer comment: Pt requiring minA for first rep sit to stand to power up to stand, but progressed to min guard for subsequent reps when using arm rests to push up to stand.    Ambulation/Gait Ambulation/Gait assistance: Min guard Gait Distance (Feet): 22 Feet (x2 bouts of ~10 ft > ~63ft) Assistive device: Rolling walker (2 wheels) Gait Pattern/deviations: Step-through pattern;Decreased stride length;Trunk flexed;Decreased step length - right;Decreased dorsiflexion - right Gait velocity: reduced Gait velocity interpretation: <1.31 ft/sec, indicative of household ambulator   General Gait Details: Pt with slow, but fiarly steady gait, no LOB, min guard for safety. Pt drags R foot with report of hx of weakness from prior CVA.  Stairs            Wheelchair Mobility    Modified Rankin (Stroke Patients Only)       Balance Overall balance assessment: Needs assistance Sitting-balance support: No upper extremity supported;Feet supported Sitting balance-Leahy Scale: Fair  Standing balance support: Reliant on assistive device for balance Standing balance-Leahy Scale: Poor Standing balance comment: Reliant on RW                             Pertinent Vitals/Pain Pain Assessment: No/denies pain     Home Living Family/patient expects to be discharged to:: Private residence Living Arrangements: Children (daughters) Available Help at Discharge: Family;Available 24 hours/day Type of Home: House Home Access: Level entry       Home Layout: One level;Laundry or work area in [place]: Conservation officer, nature (2 wheels);Tub bench;Grab bars - toilet;Grab bars - tub/shower;Hospital bed      Prior Function Prior Level of Function : Needs assist       Physical Assist : Mobility (physical);ADLs (physical) Mobility (physical): Bed mobility;Transfers;Gait ADLs (physical): Bathing;Dressing;Toileting;IADLs Mobility Comments: Pt requires assistance to manage R leg for bed mobility, intermittent assist for transfers, and a follow for gait with RW. ADLs Comments: Pt needs some assistance for bathing and dressing bottom half and with pericare.     Hand Dominance        Extremity/Trunk Assessment   Upper Extremity Assessment Upper Extremity Assessment: Defer to OT evaluation    Lower Extremity Assessment Lower Extremity Assessment: Generalized weakness;RLE deficits/detail RLE Deficits / Details: Increased weakness on R compared to L due to prior CVA    Cervical / Trunk Assessment Cervical / Trunk Assessment: Kyphotic  Communication   Communication: No difficulties  Cognition Arousal/Alertness: Awake/alert Behavior During Therapy: WFL for tasks assessed/performed Overall Cognitive Status: No family/caregiver present to determine baseline cognitive functioning                                 General Comments: Pt with tengential speech at times. Pt with difficulty hearing at times. Questionable memory.        General Comments      Exercises     Assessment/Plan    PT Assessment Patient needs continued PT services  PT Problem List Decreased strength;Decreased activity tolerance;Decreased balance;Decreased mobility;Decreased range of motion       PT  Treatment Interventions DME instruction;Gait training;Functional mobility training;Therapeutic activities;Therapeutic exercise;Balance training;Neuromuscular re-education;Patient/family education    PT Goals (Current goals can be found in the Care Plan section)  Acute Rehab PT Goals Patient Stated Goal: to get his leg better PT Goal Formulation: With patient Time For Goal Achievement: 02/23/21 Potential to Achieve Goals: Good    Frequency Min 3X/week   Barriers to discharge        Co-evaluation               AM-PAC PT "6 Clicks" Mobility  Outcome Measure Help needed turning from your back to your side while in a flat bed without using bedrails?: A Little Help needed moving from lying on your back to sitting on the side of a flat bed without using bedrails?: A Lot Help needed moving to and from a bed to a chair (including a wheelchair)?: A Little Help needed standing up from a chair using your arms (e.g., wheelchair or bedside chair)?: A Little Help needed to walk in hospital room?: A Little Help needed climbing 3-5 steps with a railing? : A Lot 6 Click Score: 16    End of Session Equipment Utilized During Treatment: Gait belt Activity Tolerance: Patient tolerated treatment well Patient left: in bed;with call bell/phone within reach;with  bed alarm set   PT Visit Diagnosis: Unsteadiness on feet (R26.81);Other abnormalities of gait and mobility (R26.89);Muscle weakness (generalized) (M62.81);Difficulty in walking, not elsewhere classified (R26.2)    Time: 9509-3267 PT Time Calculation (min) (ACUTE ONLY): 52 min   Charges:   PT Evaluation $PT Eval Moderate Complexity: 1 Mod PT Treatments $Gait Training: 8-22 mins $Therapeutic Activity: 8-22 mins        Moishe Spice, PT, DPT Acute Rehabilitation Services  Pager: [phone] Office: [phone]   Orvan Falconer 02/09/2021, 11:32 AM

## 2021-02-09 NOTE — Progress Notes (Signed)
Progress Note  Patient Name: Mark Clayton Date of Encounter: 02/09/2021  [facility] Cardiologist: Candee Furbish, MD   Subjective   Laying fairly comfortably in bed no chest pain, shortness of breath improved  Inpatient Medications    Scheduled Meds:  atorvastatin  80 mg Oral QHS   carvedilol  3.125 mg Oral BID WC   divalproex  500 mg Oral QHS   ferrous sulfate  325 mg Oral Q breakfast   furosemide  40 mg Intravenous BID   pantoprazole  40 mg Oral Daily   rivaroxaban  15 mg Oral Q supper   senna-docusate  2 tablet Oral QHS   sodium chloride flush  3 mL Intravenous Q12H   traZODone  100 mg Oral QHS   Continuous Infusions:  sodium chloride     PRN Meds: sodium chloride, acetaminophen, sodium chloride flush   Vital Signs    Vitals:   02/08/21 2049 02/09/21 0059 02/09/21 0515 02/09/21 0840  BP: 101/76 99/68 105/78 99/65  Pulse: 76 77 77 76  Resp: 16 16 16 15   Temp: (!) 97.5 F (36.4 C) 98.1 F (36.7 C) (!) 97.5 F (36.4 C) (!) 97.5 F (36.4 C)  TempSrc: Oral Oral Oral Oral  SpO2: 99% 100% 100% 100%  Weight:   67.5 kg   Height:        Intake/Output Summary (Last 24 hours) at 02/09/2021 1127 Last data filed at 02/09/2021 0519 Gross per 24 hour  Intake 240 ml  Output 1750 ml  Net -1510 ml   Last 3 Weights 02/09/2021 02/08/2021 01/27/2021  Weight (lbs) 148 lb 13 oz 153 lb 10.6 oz 144 lb 3.2 oz  Weight (kg) 67.5 kg 69.7 kg 65.409 kg      Labs    High Sensitivity Troponin:  No results for input(s): TROPONINIHS in the last 720 hours.   Chemistry Recent Labs  Lab 02/07/21 2227 02/09/21 0412  NA 134* 131*  K 5.1 3.3*  CL 103 97*  CO2 24 25  GLUCOSE 97 95  BUN 26* 21  CREATININE 1.37* 1.24  CALCIUM 8.3* 8.0*  GFRNONAA 52* 59*  ANIONGAP 7 9    Lipids No results for input(s): CHOL, TRIG, HDL, LABVLDL, LDLCALC, CHOLHDL in the last 168 hours.  Hematology Recent Labs  Lab 02/07/21 2227 02/09/21 0412  WBC 5.4 4.8  RBC 4.23 3.90*  HGB 12.7*  11.8*  HCT 40.5 37.1*  MCV 95.7 95.1  MCH 30.0 30.3  MCHC 31.4 31.8  RDW 20.5* 19.9*  PLT 127* 121*   Thyroid No results for input(s): TSH, FREET4 in the last 168 hours.  BNP Recent Labs  Lab 02/07/21 2254  BNP 2,099.8*    DDimer No results for input(s): DDIMER in the last 168 hours.   Radiology    DG Chest 2 View  Result Date: 02/07/2021 CLINICAL DATA:  Shortness of breath, CHF. EXAM: CHEST - 2 VIEW COMPARISON:  Chest x-ray 11/13/2020. FINDINGS: Left-sided pacemaker is present. Patient is status post cardiac surgery. The heart is enlarged, unchanged. There are small bilateral pleural effusions, left greater than right. There is no lung consolidation or pneumothorax. No acute fractures are seen. IMPRESSION: 1. Cardiomegaly with small bilateral pleural effusions. Electronically Signed   By: Ronney Asters M.D.   On: 02/07/2021 23:13   ECHOCARDIOGRAM COMPLETE  Result Date: 02/08/2021    ECHOCARDIOGRAM REPORT   Patient Name:   Mark Clayton Date of Exam: 02/08/2021 Medical Rec #:  [id number]  Height:       71.0 in Accession #:    [id number]     Weight:       153.7 lb Date of Birth:  07/18/1940      BSA:          1.885 m Patient Age:    80 years       BP:           101/74 mmHg Patient Gender: M              HR:           76 bpm. Exam Location:  Inpatient Procedure: 2D Echo, Cardiac Doppler, Color Doppler and Intracardiac            Opacification Agent Indications:    CHF-Acute Systolic  History:        Patient has prior history of Echocardiogram examinations, most                 recent 06/07/2019. CHF and Cardiomyopathy, CAD and Previous                 Myocardial Infarction, Defibrillator, Stroke, Arrythmias:Atrial                 Fibrillation; Risk Factors:Hypertension and Dyslipidemia.  Sonographer:    Wenda Low Referring Phys: [id number] RONDELL A SMITH IMPRESSIONS  1. Left ventricular ejection fraction, by estimation, is <20%. The left ventricle has severely decreased function. The  left ventricle demonstrates global hypokinesis. The left ventricular internal cavity size was moderately dilated. Left ventricular diastolic parameters are consistent with Grade III diastolic dysfunction (restrictive).  2. Right ventricular systolic function is normal. The right ventricular size is moderately enlarged.  3. Left atrial size was severely dilated.  4. The mitral valve is normal in structure. Trivial mitral valve regurgitation. No evidence of mitral stenosis.  5. The aortic valve is tricuspid. There is moderate calcification of the aortic valve. There is moderate thickening of the aortic valve. Aortic valve regurgitation is trivial. Aortic valve sclerosis/calcification is present, without any evidence of aortic stenosis.  6. The inferior vena cava is dilated in size with <50% respiratory variability, suggesting right atrial pressure of 15 mmHg. Comparison(s): No significant change from prior study. Prior images reviewed side by side. FINDINGS  Left Ventricle: Left ventricular ejection fraction, by estimation, is <20%. The left ventricle has severely decreased function. The left ventricle demonstrates global hypokinesis. Definity contrast agent was given IV to delineate the left ventricular endocardial borders. The left ventricular internal cavity size was moderately dilated. There is no left ventricular hypertrophy. Left ventricular diastolic parameters are consistent with Grade III diastolic dysfunction (restrictive). Right Ventricle: The right ventricular size is moderately enlarged. No increase in right ventricular wall thickness. Right ventricular systolic function is normal. Left Atrium: Left atrial size was severely dilated. Right Atrium: Right atrial size was normal in size. Pericardium: There is no evidence of pericardial effusion. Mitral Valve: The mitral valve is normal in structure. Trivial mitral valve regurgitation. No evidence of mitral valve stenosis. MV peak gradient, 2.7 mmHg. The mean  mitral valve gradient is 1.0 mmHg. Tricuspid Valve: The tricuspid valve is normal in structure. Tricuspid valve regurgitation is mild . No evidence of tricuspid stenosis. Aortic Valve: The aortic valve is tricuspid. There is moderate calcification of the aortic valve. There is moderate thickening of the aortic valve. Aortic valve regurgitation is trivial. Aortic valve sclerosis/calcification is present, without any evidence of aortic stenosis. Aortic valve mean  gradient measures 1.5 mmHg. Aortic valve peak gradient measures 3.0 mmHg. Aortic valve area, by VTI measures 1.86 cm. Pulmonic Valve: The pulmonic valve was normal in structure. Pulmonic valve regurgitation is trivial. No evidence of pulmonic stenosis. Aorta: The aortic root is normal in size and structure. Venous: The inferior vena cava is dilated in size with less than 50% respiratory variability, suggesting right atrial pressure of 15 mmHg. IAS/Shunts: No atrial level shunt detected by color flow Doppler. Additional Comments: A device lead is visualized in the right ventricle.  LEFT VENTRICLE PLAX 2D LVIDd:         7.60 cm LVIDs:         6.80 cm LV PW:         0.70 cm LV IVS:        0.70 cm LVOT diam:     2.10 cm LV SV:         30 LV SV Index:   16 LVOT Area:     3.46 cm  RIGHT VENTRICLE RV Basal diam:  4.55 cm RV Mid diam:    3.60 cm RV S prime:     5.63 cm/s TAPSE (M-mode): 1.7 cm LEFT ATRIUM              Index        RIGHT ATRIUM           Index LA diam:        4.90 cm  2.60 cm/m   RA Area:     27.40 cm LA Vol (A2C):   97.3 ml  51.61 ml/m  RA Volume:   96.50 ml  51.19 ml/m LA Vol (A4C):   122.0 ml 64.72 ml/m LA Biplane Vol: 119.0 ml 63.13 ml/m  AORTIC VALVE                    PULMONIC VALVE AV Area (Vmax):    2.02 cm     PV Vmax:       0.43 m/s AV Area (Vmean):   1.68 cm     PV Peak grad:  0.7 mmHg AV Area (VTI):     1.86 cm AV Vmax:           86.20 cm/s AV Vmean:          57.250 cm/s AV VTI:            0.161 m AV Peak Grad:      3.0 mmHg AV  Mean Grad:      1.5 mmHg LVOT Vmax:         50.35 cm/s LVOT Vmean:        27.700 cm/s LVOT VTI:          0.086 m LVOT/AV VTI ratio: 0.54  AORTA Ao Root diam: 3.10 cm MITRAL VALVE               TRICUSPID VALVE MV Area (PHT): 4.89 cm    TR Peak grad:   31.1 mmHg MV Area VTI:   1.48 cm    TR Vmax:        279.00 cm/s MV Peak grad:  2.7 mmHg MV Mean grad:  1.0 mmHg    SHUNTS MV Vmax:       0.83 m/s    Systemic VTI:  0.09 m MV Vmean:      43.6 cm/s   Systemic Diam: 2.10 cm MV Decel Time: 155 msec MV E velocity: 81.40 cm/s Candee Furbish MD Electronically signed  by Candee Furbish MD Signature Date/Time: 02/08/2021/4:02:46 PM    Final      Patient Profile     80 y.o. male end-stage heart failure stage D, prior CABG, ICD in place, Dr. Caryl Comes admitted with 40 pound weight gain  Assessment & Plan    Acute systolic heart failure - Cachectic ill-appearing.  JVD is pulsatile.  Lungs still with crackles at bases.  Heart rate irregularly irregular.  Echocardiogram personally reviewed shows EF less than 20% severely reduced no significant change from prior. -Back in 2020 and PCI to a saphenous vein graft to PDA. -Continue with Lasix 40 mg IV twice daily -Good overall diuresis yesterday. Unable to utilize traditional goal-directed medical therapy  CAD - CABG 2003 stent to RCA in 2016 and stent to SVG vein graft in 2020.  No anginal symptoms.  No aspirin because of Xarelto.  Permanent atrial fibrillation - Currently well rate controlled on Xarelto 15 mg for chronic anticoagulation based on GFR less than 50.  DNR - Agree.  Palliative care team has been consulted.    For questions or updates, please contact [place] Please consult [email or web address] for contact info under        Signed, Candee Furbish, MD  02/09/2021, 11:27 AM

## 2021-02-09 NOTE — Consult Note (Signed)
Consultation Note Date: 02/09/2021   Patient Name: Mark Clayton  DOB: June 11, 1940  MRN: [id number]  Age / Sex: 80 y.o., male  PCP: Mark Koch, MD Referring Physician: Darliss Cheney, MD  Reason for Consultation: Establishing goals of care  HPI/Patient Profile: 80 y.o. male  with past medical history of  HTN, HLD, PAF on chronic anticoagulation, s/p AICD, LV thrombus, ICM, CAD s/p CABG, CVA, colon cancer s/p sigmoid colon resection admitted on 02/07/2021 with progressively worsening swelling over the last 2 -3 weeks.  Work up shows acute on chronic systolic congestive heart failure, echo with EF <20% on 02/08/21. PMT has been consulted to assist with goals of care conversation.  Clinical Assessment and Goals of Care:  I have reviewed medical records including EPIC notes, labs and imaging, assessed the patient and then met at the bedside to discuss diagnosis prognosis, GOC, EOL wishes, disposition and options.  I introduced Palliative Medicine as specialized medical care for people living with serious illness. It focuses on providing relief from the symptoms and stress of a serious illness. The goal is to improve quality of life for both the patient and the family.  We discussed a brief life review of the patient and then focused on their current illness. Mark Clayton tells me he lives at home "somewhat" alone, with his daughter and grandson living on the property to help as needed and his sister living very close by as well. He states that he has had a good and happy life ("I have everything I could ever ask for, except my arms and legs"). He enjoys spending time in his workshop, although lately his grandson has had to do more of the Mark Clayton. Mark Clayton reports walking 80ft with his rolling walker to the commode. He reports eating 3-4 meals a day with a good appetite. He notes it has been a challenge for him to think  of certain words ever since his stroke, but denies recent changes to his cognition. He is a widow and his wife died of cancer 7 years ago.   Both his wife and father received hospice services before their deaths and he reports having a good understanding of the philosophy and resources. Mark Clayton does not feel this would be beneficial for him at this time, as he is not suffering like they did at end of life. He hopes to continue receiving medical interventions to improve his swelling and living with his current quality of life. Discussed limitations in GDMT and treatment options. I shared that his end-stage heart failure is unlikely to improve and that he may be experiencing his new normal. Mark Clayton states "that is fine too."   I attempted to elicit values and goals of care important to the patient.   Mark Clayton values his independence and time at home - he would not want to return to SNF. His goal is to continue living with his current quality of life as long as possible. His not ready for comfort-focused care however he feels ready to see the [facility] when  the time comes.  The difference between aggressive medical intervention and comfort care was considered in light of the patient's goals of care.   Advanced directives, concepts specific to code status, artifical feeding and hydration, and rehospitalization were considered and discussed.  Hospice and Palliative Care services outpatient were explained and offered.  Discussed the importance of continued conversation with family and the medical providers regarding overall plan of care and treatment options, ensuring decisions are within the context of the patient's values and GOCs.    Questions and concerns were addressed. The patient was encouraged to call with questions or concerns.  PMT will continue to support holistically.   PATIENT is the primary decision maker. Daughters are next of kin. No HCPOA on file.    SUMMARY OF RECOMMENDATIONS   -DNR -Continue current  care; patient does not feel ready for hospice philosophy and is hopeful for discharge home with [name] -Psychosocial and emotional support provided -PMT will continue to follow   Code Status/Advance Care Planning: DNR  Palliative Prophylaxis:  Delirium Protocol  Additional Recommendations (Limitations, Scope, Preferences): Full Scope Treatment  Psycho-social/Spiritual:  Desire for further Chaplaincy support:TBD Additional Recommendations: Caregiving  Support/Resources, Education on Hospice, and Referral to [employer]   Prognosis:  Poor long-term prognosis given end-stage heart failure, frailty  Discharge Planning: To Be Determined      Primary Diagnoses: Present on Admission:  Acute on chronic systolic CHF (congestive heart failure) ([place])  Cardiac defibrillator  MDT VVI  Coronary artery disease involving native heart with angina pectoris ([place])  Essential hypertension  Hyperlipidemia  Persistent atrial fibrillation   I have reviewed the medical record, interviewed the patient and family, and examined the patient. The following aspects are pertinent.  Past Medical History:  Diagnosis Date   AICD (automatic cardioverter/defibrillator) present 01/17/2003   Medtronic Maximo 7232CX ICD, serial #[id number]   Anemia 02/06/2011   takes oral iron   Arthritis    hands, knees   CAD (coronary artery disease) 2003   h/o MI and CABG in 2003. // s/p DES to [place] in 08/2014 // s/p DES to S-RPDA in 10/2018 (Cath w patent RCA stent, L-LAD and S-RI/OM2/OM3; jump graft from RPDA to RPL w CTO)   Cancer of sigmoid colon ([place]) 2012   a. s/p colon surgery.   Carotid bruit    Cough    Exudative age-related macular degeneration of left eye with active choroidal neovascularization ([place]) 07/12/2019   Exudative age-related macular degeneration, right eye, with inactive choroidal neovascularization ([place]) 07/12/2019   GERD (gastroesophageal reflux disease) 02/06/2011   HFrEF (heart failure  with reduced ejection fraction) ([place])    a. EF 20% in 2014; b. 08/2017 Echo: EF 20-25%, diff HK, Gr3 DD. Triv AI. Mod MR. Sev dil LA. Mildly dil RV w/ mildly reduced RV fxn. Mildly dil RA. Mod TR. PASP 59mHg. // Echo 3/21: EF < 20, mild MR, mild AI, mod LAE, mod reduced RVSF   HTN (hypertension)    Hyperlipidemia    Ischemic cardiomyopathy    a. EF 20% in 2014. (Master study EF >20%); b. 08/2017 Echo: EF 20-25%, diff HK. Gr3 DD.   LV (left ventricular) mural thrombus    a. 12/2012 Echo: EF 20% with mural thrombus No evidence of thrombus on 08/2017 echo.   Macular degeneration    Myocardial infarct ([place])    2003   PAF (paroxysmal atrial fibrillation) (HCC)    a. CHA2DS2VASc = 5-->Xarelto/Tikosyn.   Ventricular tachycardia 11/05/2020   Social History  Socioeconomic History   Marital status: Widowed    Spouse name: Not on file   Number of children: 3   Years of education: Not on file   Highest education level: Not on file  Occupational History   Occupation: self employed/ Art gallery manager  Tobacco Use   Smoking status: Never   Smokeless tobacco: Never  Vaping Use   Vaping Use: Never used  Substance and Sexual Activity   Alcohol use: Not Currently    Alcohol/week: 2.0 standard drinks    Types: 2 Cans of beer per week   Drug use: No   Sexual activity: Not Currently  Other Topics Concern   Not on file  Social History Narrative   Full time. Married.    Right handed   Drinks caffeine   [facility]   Social Determinants of Health   Financial Resource Strain: Low Risk    Difficulty of Paying Living Expenses: Not hard at all  Food Insecurity: No Food Insecurity   Worried About [name] fundraiser in the Last Year: Never true   Arboriculturist in the Last Year: Never true  Transportation Needs: No Transportation Needs   Lack of Transportation (Medical): No   Lack of Transportation (Non-Medical): No  Physical Activity: Insufficiently Active   Days of Exercise per Week: 2  days   Minutes of Exercise per Session: 60 min  Stress: No Stress Concern Present   Feeling of Stress : Not at all  Social Connections: Not on file   Family History  Problem Relation Age of Onset   Hypertension Father    Hyperlipidemia Father    Heart disease Father    Prostate cancer Father    Alzheimer's disease Mother    Hypertension Sister    Hyperlipidemia Sister    Colon cancer Neg Hx    Esophageal cancer Neg Hx    Rectal cancer Neg Hx    Stomach cancer Neg Hx    Scheduled Meds:  atorvastatin  80 mg Oral QHS   carvedilol  3.125 mg Oral BID WC   divalproex  500 mg Oral QHS   ferrous sulfate  325 mg Oral Q breakfast   furosemide  40 mg Intravenous BID   pantoprazole  40 mg Oral Daily   rivaroxaban  15 mg Oral Q supper   senna-docusate  2 tablet Oral QHS   sodium chloride flush  3 mL Intravenous Q12H   traZODone  100 mg Oral QHS   Continuous Infusions:  sodium chloride     PRN Meds:.sodium chloride, acetaminophen, sodium chloride flush Medications Prior to Admission:  Prior to Admission medications   Medication Sig Start Date End Date Taking? Authorizing Provider  acetaminophen (TYLENOL) 500 MG tablet Take 500 mg by mouth every 6 (six) hours as needed for moderate pain or headache.   Yes [provider]  atorvastatin (LIPITOR) 80 MG tablet TAKE 1 TABLET BY MOUTH  DAILY Patient taking differently: Take 80 mg by mouth at bedtime. 05/03/20  Yes Jerline Pain, MD  bisacodyl (DULCOLAX) 10 MG suppository Place 10 mg rectally See admin instructions. Qd on Monday and Thursday as needed for constipation   Yes [provider]  carvedilol (COREG) 3.125 MG tablet Take 1 tablet (3.125 mg total) by mouth 2 (two) times daily with a meal. Patient taking differently: Take 3.125 mg by mouth 2 (two) times daily with a meal. Hold for sbp<100 or hr<60 11/21/20  Yes Shelly Coss, MD  divalproex (  DEPAKOTE ER) 250 MG 24 hr tablet Take 2 tablets every night Patient taking  differently: Take 500 mg by mouth at bedtime. 01/20/21  Yes Cameron Sprang, MD  ferrous sulfate 325 (65 FE) MG tablet Take 1 tablet (325 mg total) by mouth 2 (two) times daily with a meal. Patient taking differently: Take 325 mg by mouth daily with breakfast. 11/21/20  Yes Shelly Coss, MD  furosemide (LASIX) 40 MG tablet Take 1.5 tablets (60 mg total) by mouth 2 (two) times daily. Patient taking differently: Take 40 mg by mouth daily. 11/05/20  Yes Jerline Pain, MD  meclizine (ANTIVERT) 25 MG tablet Take 12.5 mg by mouth daily as needed for dizziness.   Yes [provider]  nitroGLYCERIN (NITROSTAT) 0.4 MG SL tablet DISSOLVE 1 TABLET UNDER THE TONGUE EVERY 5 MINUTES AS  NEEDED FOR CHEST PAIN. MAX  OF 3 TABLETS IN 15 MINUTES. CALL 911 IF PAIN PERSISTS. Patient taking differently: Place 0.4 mg under the tongue every 5 (five) minutes as needed for chest pain. 12/31/20  Yes Jerline Pain, MD  pantoprazole (PROTONIX) 40 MG tablet TAKE 1 TABLET BY MOUTH  DAILY Patient taking differently: Take 40 mg by mouth daily. 07/17/20  Yes Mark Koch, MD  polyethylene glycol (MIRALAX / GLYCOLAX) 17 g packet Take 17 g by mouth daily as needed for mild constipation.   Yes [provider]  potassium chloride (KLOR-CON) 10 MEQ tablet Take 20 mEq by mouth every other day.   Yes [provider]  rivaroxaban (XARELTO) 20 MG TABS tablet Take 20 mg by mouth every evening.   Yes [provider]  sennosides-docusate sodium (SENOKOT-S) 8.6-50 MG tablet Take 2 tablets by mouth at bedtime.   Yes [provider]  traZODone (DESYREL) 50 MG tablet Take 2 tablet every night Patient taking differently: Take 100 mg by mouth at bedtime. 02/04/21  Yes Cameron Sprang, MD  vitamin B-12 (CYANOCOBALAMIN) 1000 MCG tablet Take 1,000 mcg by mouth daily.   Yes [provider]  baclofen (LIORESAL) 10 MG tablet Take 1 tablet (10 mg total) by mouth 2 (two) times daily as needed for  muscle spasms. Patient not taking: Reported on 02/08/2021 11/12/20   Mark Koch, MD   Allergies  Allergen Reactions   Buspar [Buspirone] Other (See Comments)    Fatigue, body aches and shortness of breath    Latex Rash   Tape Rash and Other (See Comments)    USE PAPER   Review of Systems  All other systems reviewed and are negative.  Physical Exam Vitals and nursing note reviewed.  Constitutional:      General: He is awake. He is not in acute distress.    Appearance: He is ill-appearing.     Comments: Frail  Cardiovascular:     Rate and Rhythm: Rhythm irregularly irregular.  Pulmonary:     Effort: Pulmonary effort is normal. No respiratory distress.  Skin:    General: Skin is warm and dry.  Neurological:     Mental Status: He is alert. Mental status is at baseline.  Psychiatric:        Behavior: Behavior is cooperative.    Vital Signs: BP 99/65 (BP Location: Left Arm)   Pulse 76   Temp (!) 97.5 F (36.4 C) (Oral)   Resp 15   Ht $R'5\' 11"'EV$  (1.803 m)   Wt 67.5 kg   SpO2 100%   BMI 20.75 kg/m  Pain Scale: 0-10   Pain  Score: 0-No pain   SpO2: SpO2: 100 % O2 Device:SpO2: 100 % O2 Flow Rate: .   IO: Intake/output summary:  Intake/Output Summary (Last 24 hours) at 02/09/2021 1224 Last data filed at 02/09/2021 8118 Gross per 24 hour  Intake 240 ml  Output 1750 ml  Net -1510 ml    LBM: Last BM Date: 02/08/21 Baseline Weight: Weight: 69.7 kg Most recent weight: Weight: 67.5 kg     Palliative Assessment/Data: 60%     Time In: 11:00am Time Out: 12:10pm  Time Total: 70 minutes Greater than 50% of this time was spent in counseling and coordinating care related to the above assessment and plan.  Dorthy Cooler, PA-C Palliative Medicine Team Team phone # [phone]  Thank you for allowing the Palliative Medicine Team to assist in the care of this patient. Please utilize secure chat with additional questions, if there is no response within 30  minutes please call the above phone number.  Palliative Medicine Team providers are available by phone from 7am to 7pm daily and can be reached through the team cell phone.  Should this patient require assistance outside of these hours, please call the patient's attending physician.

## 2021-02-09 NOTE — TOC Initial Note (Signed)
Transition of Care [facility]) - Initial/Assessment Note    Patient Details  Name: Mark Clayton MRN: [id number] Date of Birth: October 18, 1940  [facility] CM/SW Contact:    Bethena Roys, RN Phone Number: 02/09/2021, 3:37 PM  Clinical Narrative:  Case Manager spoke with patient regarding disposition needs. Prior to arrival patient was from home with a full-time care giver 5 days a week from 7:45 am-3:45 pm. Daughters alternate care at night and the weekends. His granddaughter Carlyle Lipa lives in the home with him. Patient is currently active with [facility] for physical therapy- patient will benefit from RN services for CHF Management. Case Manager received verbal permission to call the daughters regarding disposition needs. Patient and daughters are agreeable to add a RN for CHF Management. Referral submitted to [place] RN/PT orders to be written for home services once stable. Patient has durable medical equipment (DME): digital scale, cane, rollator, rolling walker, hospital bed, and wheelchair in the home. No further DME needs identified. Case Manager will continue to follow for additional transition of care needs.           Expected Discharge Plan: [place] Barriers to Discharge: Continued Medical Work up   Patient Goals and CMS Choice Patient states their goals for this hospitalization and ongoing recovery are:: To return home with home health services.      Expected Discharge Plan and Services Expected Discharge Plan: [place] In-house Referral: NA Discharge Planning Services: CM Consult Post Acute Care Choice: Home Health, Resumption of Svcs/PTA Provider Living arrangements for the past 2 months: Single Family Home                   DME Agency: NA       HH Arranged: RN, Disease Management, PT HH Agency: [place] Date HH Agency Contacted: 02/09/21 Time HH Agency Contacted:  50 Representative spoke with at [place]: [place] Arrangements/Services Living arrangements for the past 2 months: [place] with:: Self, Relatives Patient language and need for interpreter reviewed:: Yes Do you feel safe going back to the place where you live?: Yes      Need for Family Participation in Patient Care: Yes (Comment) Care giver support system in place?: Yes (comment) Current home services: DME, Home PT, Sitter (Patient has DME HB, Cane, Rollator, RW, WC- Active with [facility] PT, Has sitter in the home 5 days a week- 7:45 am-3:45 pm) Criminal Activity/Legal Involvement Pertinent to Current Situation/Hospitalization: No - Comment as needed  Activities of Daily Living      Permission Sought/Granted Permission sought to share information with : Family Supports, Customer service manager, Case Optician, dispensing granted to share information with : Yes, Verbal Permission Granted     Permission granted to share info w AGENCY: [place]        Emotional Assessment Appearance:: Appears stated age Attitude/Demeanor/Rapport: Engaged Affect (typically observed): Appropriate Orientation: : Oriented to Situation, Oriented to  Time, Oriented to Place, Oriented to Self Alcohol / Substance Use: Not Applicable Psych Involvement: No (comment)  Admission diagnosis:  Acute on chronic systolic congestive heart failure (HCC) [I50.23] Acute on chronic systolic CHF (congestive heart failure) ([place]) [I50.23] CHF (congestive heart failure) ([place]) [I50.9] Patient Active Problem List   Diagnosis Date Noted   Acute on chronic systolic CHF (congestive heart failure) ([place]) 02/08/2021   CHF (congestive heart failure) ([place]) 02/08/2021  Delirium 11/26/2020   Pleural effusion due to CHF (congestive heart failure) ([place]) 11/13/2020   Chronic anticoagulation 11/05/2020   Ventricular tachycardia 11/05/2020   Chronic cough 10/24/2020    Bilateral inguinal hernia 10/11/2020   Focal neurological deficit, onset greater than 24 hours 09/27/2020   Hallucination 09/27/2020   Pre-diabetes 09/27/2020   Anxiety 08/14/2020   Hemiplegia of right dominant side as late effect of cerebral infarction ([place]) 07/19/2020   Low urine output 07/05/2020   Hypotension 07/05/2020   Bradycardia 04/14/2020   Exudative age-related macular degeneration of left eye with inactive choroidal neovascularization ([place]) 02/13/2020   Exudative age-related macular degeneration of right eye with active choroidal neovascularization ([place]) 07/12/2019   Intermediate stage nonexudative age-related macular degeneration of left eye 07/12/2019   Hx of completed stroke 06/14/2019   Dysarthria    Tinnitus 08/26/2018   Essential hypertension 01/21/2018   Coronary artery disease involving native heart with angina pectoris ([place]) 10/19/2017   Persistent atrial fibrillation    Goals of care, counseling/discussion 01/10/2016   Ischemic cardiomyopathy    Personal history of colon cancer    Implantable cardioverter-defibrillator (ICD) in situ 03/09/2012   Microcytic anemia 01/12/2011   Cardiac defibrillator  MDT VVI 12/26/2010   Hyperlipidemia 27/78/2423   Chronic systolic heart failure ([place]) 11/26/2008   PCP:  [name], MD Pharmacy:   [facility] [place], [place] [facility] [address] [place] [address] Phone: [phone] Fax: [phone]  [id number] [place], [place] - [address] [place] [address] Phone: [phone] Fax: [phone]  [facility] ([facility]) - [place], [place] [address] Phone: [phone] Fax: [phone]    Readmission Risk Interventions No flowsheet data found.

## 2021-02-10 ENCOUNTER — Other Ambulatory Visit (HOSPITAL_COMMUNITY): Payer: Self-pay

## 2021-02-10 ENCOUNTER — Encounter (HOSPITAL_COMMUNITY): Payer: Self-pay | Admitting: Internal Medicine

## 2021-02-10 DIAGNOSIS — I5023 Acute on chronic systolic (congestive) heart failure: Secondary | ICD-10-CM | POA: Diagnosis not present

## 2021-02-10 DIAGNOSIS — I4819 Other persistent atrial fibrillation: Secondary | ICD-10-CM | POA: Diagnosis not present

## 2021-02-10 DIAGNOSIS — Z7189 Other specified counseling: Secondary | ICD-10-CM | POA: Diagnosis not present

## 2021-02-10 DIAGNOSIS — Z95810 Presence of automatic (implantable) cardiac defibrillator: Secondary | ICD-10-CM | POA: Diagnosis not present

## 2021-02-10 LAB — BASIC METABOLIC PANEL
Anion gap: 8 (ref 5–15)
BUN: 21 mg/dL (ref 8–23)
CO2: 25 mmol/L (ref 22–32)
Calcium: 8.3 mg/dL — ABNORMAL LOW (ref 8.9–10.3)
Chloride: 101 mmol/L (ref 98–111)
Creatinine, Ser: 1.21 mg/dL (ref 0.61–1.24)
GFR, Estimated: >60 mL/min (ref >60)
Glucose, Bld: 91 mg/dL (ref 70–99)
Potassium: 3.5 mmol/L (ref 3.5–5.1)
Sodium: 134 mmol/L — ABNORMAL LOW (ref 135–145)

## 2021-02-10 MED ORDER — POTASSIUM CHLORIDE CRYS ER 20 MEQ PO TBCR
40.0000 meq | EXTENDED_RELEASE_TABLET | Freq: Once | ORAL | Status: AC
  Administered 2021-02-10: 40 meq via ORAL
  Filled 2021-02-10: qty 2

## 2021-02-10 MED ORDER — POTASSIUM CHLORIDE CRYS ER 20 MEQ PO TBCR
20.0000 meq | EXTENDED_RELEASE_TABLET | Freq: Once | ORAL | Status: AC
  Administered 2021-02-10: 20 meq via ORAL
  Filled 2021-02-10: qty 1

## 2021-02-10 NOTE — Progress Notes (Signed)
Heart Failure Nurse Navigator Progress Note  PCP: [name], MD PCP-Cardiologist: Derl Barrow., MD Admission Diagnosis: CHF  Admitted from: home with family  Presentation:   Mark Clayton presented 11/11 with increased edema. Attempted to interview pt for HV TOC readiness, pt unable to recall information. Stated his sister is on her way, and Navigator could call daughter to get information. Pt could not recall the name of his daughter on who to call.  Mark Clayton, sister, at bedside able to give information. States pt lives at home and his step-granddaughter lives with him. Pt has independent care giver 8 hours, 5 days a week. Family lives down the road to help on the weekends and evening. No mortgage, bills caught up. Pt in donut hole for medications currently. Family drives pt to appts, declined [employer].  Explained plan to start pt on new medications and optimize, explained HV TOC clinic and benefits to pt, agreed.   ECHO/ LVEF: <20%, G3DD  Clinical Course:  Past Medical History:  Diagnosis Date   AICD (automatic cardioverter/defibrillator) present 01/17/2003   Medtronic Maximo 7232CX ICD, serial #[id number] S   Anemia 02/06/2011   takes oral iron   Arthritis    hands, knees   CAD (coronary artery disease) 2003   h/o MI and CABG in 2003. // s/p DES to [place] in 08/2014 // s/p DES to S-RPDA in 10/2018 (Cath w patent RCA stent, L-LAD and S-RI/OM2/OM3; jump graft from RPDA to RPL w CTO)   Cancer of sigmoid colon ([place]) 2012   a. s/p colon surgery.   Carotid bruit    Cough    Exudative age-related macular degeneration of left eye with active choroidal neovascularization ([place]) 07/12/2019   Exudative age-related macular degeneration, right eye, with inactive choroidal neovascularization ([place]) 07/12/2019   GERD (gastroesophageal reflux disease) 02/06/2011   HFrEF (heart failure with reduced ejection fraction) ([place])    a. EF 20% in 2014; b. 08/2017 Echo: EF 20-25%, diff HK, Gr3 DD.  Triv AI. Mod MR. Sev dil LA. Mildly dil RV w/ mildly reduced RV fxn. Mildly dil RA. Mod TR. PASP 47mHg. // Echo 3/21: EF < 20, mild MR, mild AI, mod LAE, mod reduced RVSF   HTN (hypertension)    Hyperlipidemia    Ischemic cardiomyopathy    a. EF 20% in 2014. (Master study EF >20%); b. 08/2017 Echo: EF 20-25%, diff HK. Gr3 DD.   LV (left ventricular) mural thrombus    a. 12/2012 Echo: EF 20% with mural thrombus No evidence of thrombus on 08/2017 echo.   Macular degeneration    Myocardial infarct ([place])    2003   PAF (paroxysmal atrial fibrillation) ([facility])    a. CHA2DS2VASc = 5-->Xarelto/Tikosyn.   Ventricular tachycardia 11/05/2020     Social History   Socioeconomic History   Marital status: Widowed    Spouse name: Not on file   Number of children: 1   Years of education: Not on file   Highest education level: Not on file  Occupational History   Occupation: retired  Tobacco Use   Smoking status: Never   Smokeless tobacco: Never  Vaping Use   Vaping Use: Never used  Substance and Sexual Activity   Alcohol use: Not Currently    Alcohol/week: 2.0 standard drinks    Types: 2 Cans of beer per week   Drug use: No   Sexual activity: Not Currently  Other Topics Concern   Not on file  Social History Narrative   Full time.  Married.    Right handed   Drinks caffeine   [facility]   Social Determinants of Health   Financial Resource Strain: High Risk   Difficulty of Paying Living Expenses: Hard  Food Insecurity: No Food Insecurity   Worried About [name] fundraiser in the Last Year: Never true   Ran Out of Food in the Last Year: Never true  Transportation Needs: No Transportation Needs   Lack of Transportation (Medical): No   Lack of Transportation (Non-Medical): No  Physical Activity: Insufficiently Active   Days of Exercise per Week: 2 days   Minutes of Exercise per Session: 60 min  Stress: No Stress Concern Present   Feeling of Stress : Not at all  Social  Connections: Not on file    High Risk Criteria for Readmission and/or Poor Patient Outcomes: Heart failure hospital admissions (last 6 months): 2  No Show rate: 3% Difficult social situation: yes Demonstrates medication adherence: yes Primary Language: English Literacy level: pt has vision impairments and hearing impairments. Concern for safe self care at home--pt has caregiver and family in house.   Education Assessment and Provision:  Detailed education and instructions provided on heart failure disease management including the following:  Signs and symptoms of Heart Failure When to call the physician Importance of daily weights Low sodium diet Fluid restriction Medication management Anticipated future follow-up appointments  Patient education given on each of the above topics.  Patient acknowledges understanding via teach back method and acceptance of all instructions.  Education Materials:  "Living Better With Heart Failure" Booklet, HF zone tool, & Daily Weight Tracker Tool.  Patient has scale at home: yes Patient has pill box at home: yes   Barriers of Care:   -memory impairment -heard of hearing -financial strain  Considerations/Referrals:   Referral made to Heart Failure Pharmacist Stewardship: yes, appreciated Referral made to Heart Failure CSW/NCM TOC: no Referral made to Heart & Vascular TOC clinic: yes, 11/22 @ 2pm  Items for Follow-up on DC/TOC: -optimize -RX: PTA SGLT2i (donut hole)   Pricilla Holm, MSN, RN Heart Failure Nurse Navigator [phone]

## 2021-02-10 NOTE — Progress Notes (Signed)
No ICM remote transmission received for 02/10/2021 due to currently hospitalized and next ICM transmission scheduled for 03/03/2021.

## 2021-02-10 NOTE — Progress Notes (Signed)
Daily Progress Note   Patient Name: Mark Clayton       Date: 02/10/2021 DOB: 01-18-1941  Age: 80 y.o. MRN#: [id number] Attending Physician: Mark Cheney, MD Primary Care Physician: Mark Koch, MD Admit Date: 02/07/2021  Reason for Consultation/Follow-up: Establishing goals of care  Subjective: Medical records reviewed. Patient assessed at the bedside. He reports feeling "okay" with some discomfort in his arms and legs due to continued swelling.   Created space and opportunity for patient's thoughts and feelings on his current illness. He is concerned about getting enough fluid off, as he does not want to discharge home too early. Emotional support and therapeutic listening was provided as patient shared his worry that discharge plans are already being made, stating "I always pay my bills I don't understand the problem." Provided reassurance that a safe and effective plan will be solidified before his discharge home. Mark Clayton's goal is to return home and feel as well as he can for long as possible, avoiding rehospitalization and eventually "meet my maker." We discussed outpatient palliative care referral. I also explained the availability of a transition from palliative to hospice support, rather than rehospitalization, if he declines despite ongoing interventions. Mark Clayton is agreeable and appreciative of extra support and advocacy.  Questions and concerns addressed. PMT will continue to support holistically.   Length of Stay: 2  Current Medications: Scheduled Meds:   atorvastatin  80 mg Oral QHS   carvedilol  3.125 mg Oral BID WC   divalproex  500 mg Oral QHS   ferrous sulfate  325 mg Oral Q breakfast   furosemide  40 mg Intravenous BID   pantoprazole  40 mg Oral Daily   rivaroxaban   15 mg Oral Q supper   senna-docusate  2 tablet Oral QHS   sodium chloride flush  3 mL Intravenous Q12H   traZODone  100 mg Oral QHS    Continuous Infusions:  sodium chloride      PRN Meds: sodium chloride, acetaminophen, sodium chloride flush  Physical Exam Vitals and nursing note reviewed.  Constitutional:      General: He is not in acute distress.    Appearance: He is ill-appearing.  HENT:     Head: Normocephalic and atraumatic.  Cardiovascular:     Rate and Rhythm: Normal rate.  Pulmonary:  Effort: Pulmonary effort is normal.  Skin:    General: Skin is warm and dry.  Neurological:     Mental Status: He is alert. Mental status is at baseline.            Vital Signs: BP 100/75 (BP Location: Left Arm)   Pulse 65   Temp (!) 97.5 F (36.4 C) (Oral)   Resp 17   Ht 5\' 11"  (1.803 m)   Wt 65.2 kg   SpO2 98%   BMI 20.05 kg/m  SpO2: SpO2: 98 % O2 Device: O2 Device: Room Air O2 Flow Rate:    Intake/output summary:  Intake/Output Summary (Last 24 hours) at 02/10/2021 1148 Last data filed at 02/10/2021 2993 Gross per 24 hour  Intake 723 ml  Output 1550 ml  Net -827 ml   LBM: Last BM Date: 02/09/21 Baseline Weight: Weight: 69.7 kg Most recent weight: Weight: 65.2 kg       Palliative Assessment/Data: 50-60%      Patient Active Problem List   Diagnosis Date Noted   Acute on chronic systolic CHF (congestive heart failure) ([place]) 02/08/2021   CHF (congestive heart failure) ([place]) 02/08/2021   Delirium 11/26/2020   Pleural effusion due to CHF (congestive heart failure) ([place]) 11/13/2020   Chronic anticoagulation 11/05/2020   Ventricular tachycardia 11/05/2020   Chronic cough 10/24/2020   Bilateral inguinal hernia 10/11/2020   Focal neurological deficit, onset greater than 24 hours 09/27/2020   Hallucination 09/27/2020   Pre-diabetes 09/27/2020   Anxiety 08/14/2020   Hemiplegia of right dominant side as late effect of cerebral infarction ([place]) 07/19/2020    Low urine output 07/05/2020   Hypotension 07/05/2020   Bradycardia 04/14/2020   Exudative age-related macular degeneration of left eye with inactive choroidal neovascularization ([place]) 02/13/2020   Exudative age-related macular degeneration of right eye with active choroidal neovascularization ([place]) 07/12/2019   Intermediate stage nonexudative age-related macular degeneration of left eye 07/12/2019   Hx of completed stroke 06/14/2019   Dysarthria    Tinnitus 08/26/2018   Essential hypertension 01/21/2018   Coronary artery disease involving native heart with angina pectoris ([place]) 10/19/2017   Persistent atrial fibrillation    Goals of care, counseling/discussion 01/10/2016   Ischemic cardiomyopathy    Personal history of colon cancer    Implantable cardioverter-defibrillator (ICD) in situ 03/09/2012   Microcytic anemia 01/12/2011   Cardiac defibrillator  MDT VVI 12/26/2010   Hyperlipidemia 71/69/6789   Chronic systolic heart failure ([place]) 11/26/2008    Palliative Care Assessment & Plan   Patient Profile: 80 y.o. male  with past medical history of  HTN, HLD, PAF on chronic anticoagulation, s/p AICD, LV thrombus, ICM, CAD s/p CABG, CVA, colon cancer s/p sigmoid colon resection admitted on 02/07/2021 with progressively worsening swelling over the last 2 -3 weeks.   Work up shows acute on chronic systolic congestive heart failure, echo with EF <20% on 02/08/21. PMT has been consulted to assist with goals of care conversation.  Assessment: Acute on chronic combined systolic and diastolic congestive heart failure Goals of care conversation  Recommendations/Plan: Continue current care Patient is agreeable to outpatient palliative care referral at discharge Psychosocial and emotional support provided PMT will continue to follow acutely on an as-needed basis. Please secure chat with urgent needs  Goals of Care and Additional Recommendations: Limitations on Scope of Treatment: Avoid  Hospitalization  Prognosis:  Poor long-term prognosis given end-stage heart failure, frailty  Discharge Planning: Home with Palliative Services  Care plan was discussed with  patient  Total time: 25 minutes Greater than 50% of this time was spent in counseling and coordinating care related to the above assessment and plan.  Mark Cooler, PA-C Palliative Medicine Team Team phone # [phone]  Thank you for allowing the Palliative Medicine Team to assist in the care of this patient. Please utilize secure chat with additional questions, if there is no response within 30 minutes please call the above phone number.  Palliative Medicine Team providers are available by phone from 7am to 7pm daily and can be reached through the team cell phone.  Should this patient require assistance outside of these hours, please call the patient's attending physician.

## 2021-02-10 NOTE — Progress Notes (Signed)
PROGRESS NOTE    Mark Clayton  VCB:[id number] DOB: Aug 13, 1940 DOA: 02/07/2021 PCP: [name], MD   Brief Narrative:  Mark Clayton is a 80 y.o. male with medical history significant of HTN, HLD, PAF on chronic anticoagulation, s/p AICD, LV thrombus, ICM, CAD s/p CABG, CVA, colon cancer s/p sigmoid colon resection who presented with complaints of progressively worsening swelling over the last 2 -3 weeks.  Normally patient ambulates with the use of a rolling walker.  He had been having difficulty getting around at home due to symptoms.  Noted swelling all the way into his abdomen which was causing him discomfort.  Patient was seen last by his primary care provider on 10/31 due to symptoms.  He had increased his dose of Lasix from 40 to 60 mg daily for at least the last 2 weeks, but had not noticed any significant change.  Associated symptoms included orthopnea, wheezing(reported by daughter), nonproductive cough, and malaise.      Upon admission into the emergency department patient was seen to be afebrile and hemodynamically stable.  BNP 2099.8.  Chest x-ray noted cardiomegaly with small bilateral pleural effusions.  Influenza and COVID-19 screening were negative.  Patient was given 60 mg of Lasix IV x1 dose.  Admitted under [place].    Assessment & Plan:   Principal Problem:   Acute on chronic systolic CHF (congestive heart failure) (HCC) Active Problems:   Hyperlipidemia   Cardiac defibrillator  MDT VVI   Persistent atrial fibrillation   Coronary artery disease involving native heart with angina pectoris [facility])   Essential hypertension   Chronic anticoagulation   CHF (congestive heart failure) (HCC)  Acute on chronic combined systolic and diastolic congestive heart failure: BNP of 2099.8.  Chest x-ray noted cardiomegaly with small bilateral pleural effusions.  Last echocardiogram noted EF of less than 20% with global hypokinesis in 05/2019 and grade 2 diastolic dysfunction. He  was initially given 60 mg Lasix IV in the ED with output of at least 1 L and improvement in symptoms.  Seen by cardiology.  Remains on Lasix IV 40 mg twice daily.  Net -4.1 L since admission.  Still has +3 pitting edema.  Strict I's and O's, daily weight and low-sodium diet.  Echo with no change.  Appreciate cardiology for managing this.   Permanent atrial fibrillation on chronic anticoagulation: Rate controlled.  Continue Xarelto and Coreg   Essential hypertension: Blood pressure soft.  Continue home Coreg 3.125 mg p.o. twice daily.    CAD s/p CABG: Last cardiac cath from 11/22/2018 noted multivessel coronary artery disease with successful PCI to SVG to RPDA with a drug-eluting stent.  Asymptomatic.   History of VT ischemic cardiomyopathy s/p AICD -Orders placed to have AICD interrogated   Hyponatremia: Acute.  Sodium 131 on admission.  Suspect secondary to patient being fluid overload.  Treat CHF.   Chronic kidney disease stage IIIa: At baseline.   Hyperlipidemia -Continue atorvastatin   GERD -Continue Protonix   DVT prophylaxis:    Code Status: DNR  Family Communication:  None present at bedside.  Plan of care discussed with patient in length and he verbalized understanding and agreed with it.  Status is: Inpatient  Remains inpatient appropriate because: Needs further IV diuresis.  Estimated body mass index is 20.05 kg/m as calculated from the following:   Height as of this encounter: 5\' 11"  (1.803 m).   Weight as of this encounter: 65.2 kg.     Nutritional Assessment: Body mass  index is 20.05 kg/m.Marland Kitchen Seen by dietician.  I agree with the assessment and plan as outlined below: Nutrition Status:  Skin Assessment: I have examined the patient's skin and I agree with the wound assessment as performed by the wound care RN as outlined below:    Consultants:  Cardiology  Procedures:  None  Antimicrobials:  Anti-infectives (From admission, onward)    None           Subjective:  Patient seen and examined.  He has no complaints.  Objective: Vitals:   02/09/21 1806 02/09/21 2039 02/10/21 0300 02/10/21 1100  BP: (!) 97/55 95/69  100/75  Pulse: 73 75  65  Resp:  17  17  Temp:  (!) 97.5 F (36.4 C)    TempSrc:  Oral    SpO2:  99%  98%  Weight:   65.2 kg   Height:        Intake/Output Summary (Last 24 hours) at 02/10/2021 1148 Last data filed at 02/10/2021 0938 Gross per 24 hour  Intake 723 ml  Output 1550 ml  Net -827 ml    Filed Weights   02/08/21 0456 02/09/21 0515 02/10/21 0300  Weight: 69.7 kg 67.5 kg 65.2 kg    Examination:  General exam: Appears calm and comfortable  Respiratory system: Clear to auscultation. Respiratory effort normal. Cardiovascular system: S1 & S2 heard, RRR. No JVD, murmurs, rubs, gallops or clicks.  +3 pitting edema bilateral lower extremity. Gastrointestinal system: Abdomen is nondistended, soft and nontender. No organomegaly or masses felt. Normal bowel sounds heard. Central nervous system: Alert and oriented. No focal neurological deficits. Extremities: Symmetric 5 x 5 power. Skin: No rashes, lesions or ulcers.   Data Reviewed: I have personally reviewed following labs and imaging studies  CBC: Recent Labs  Lab 02/07/21 2227 02/09/21 0412  WBC 5.4 4.8  NEUTROABS  --  3.0  HGB 12.7* 11.8*  HCT 40.5 37.1*  MCV 95.7 95.1  PLT 127* 121*    Basic Metabolic Panel: Recent Labs  Lab 02/07/21 2227 02/07/21 2253 02/09/21 0412 02/10/21 0319  NA 134*  --  131* 134*  K 5.1  --  3.3* 3.5  CL 103  --  97* 101  CO2 24  --  25 25  GLUCOSE 97  --  95 91  BUN 26*  --  21 21  CREATININE 1.37*  --  1.24 1.21  CALCIUM 8.3*  --  8.0* 8.3*  PHOS  --  3.3  --   --     GFR: Estimated Creatinine Clearance: 44.9 mL/min (by C-G formula based on SCr of 1.21 mg/dL). Liver Function Tests: No results for input(s): AST, ALT, ALKPHOS, BILITOT, PROT, ALBUMIN in the last 168 hours. No results for  input(s): LIPASE, AMYLASE in the last 168 hours. No results for input(s): AMMONIA in the last 168 hours. Coagulation Profile: Recent Labs  Lab 02/07/21 2227  INR 2.8*    Cardiac Enzymes: No results for input(s): CKTOTAL, CKMB, CKMBINDEX, TROPONINI in the last 168 hours. BNP (last 3 results) Recent Labs    01/27/21 1144  PROBNP 1,995.0*    HbA1C: No results for input(s): HGBA1C in the last 72 hours. CBG: No results for input(s): GLUCAP in the last 168 hours. Lipid Profile: No results for input(s): CHOL, HDL, LDLCALC, TRIG, CHOLHDL, LDLDIRECT in the last 72 hours. Thyroid Function Tests: No results for input(s): TSH, T4TOTAL, FREET4, T3FREE, THYROIDAB in the last 72 hours. Anemia Panel: No results for input(s): VITAMINB12, FOLATE,  FERRITIN, TIBC, IRON, RETICCTPCT in the last 72 hours. Sepsis Labs: No results for input(s): PROCALCITON, LATICACIDVEN in the last 168 hours.  Recent Results (from the past 240 hour(s))  Resp Panel by RT-PCR (Flu A&B, Covid) Nasopharyngeal Swab     Status: None   Collection Time: 02/07/21 10:28 PM   Specimen: Nasopharyngeal Swab; Nasopharyngeal(NP) swabs in vial transport medium  Result Value Ref Range Status   SARS Coronavirus 2 by RT PCR NEGATIVE NEGATIVE Final    Comment: (NOTE) SARS-CoV-2 target nucleic acids are NOT DETECTED.  The SARS-CoV-2 RNA is generally detectable in upper respiratory specimens during the acute phase of infection. The lowest concentration of SARS-CoV-2 viral copies this assay can detect is 138 copies/mL. A negative result does not preclude SARS-Cov-2 infection and should not be used as the sole basis for treatment or other patient management decisions. A negative result may occur with  improper specimen collection/handling, submission of specimen other than nasopharyngeal swab, presence of viral mutation(s) within the areas targeted by this assay, and inadequate number of viral copies(<138 copies/mL). A negative  result must be combined with clinical observations, patient history, and epidemiological information. The expected result is Negative.  Fact Sheet for Patients:  EntrepreneurPulse.com.au  Fact Sheet for Healthcare Providers:  IncredibleEmployment.be  This test is no t yet approved or cleared by the [place] FDA and  has been authorized for detection and/or diagnosis of SARS-CoV-2 by FDA under an Emergency Use Authorization (EUA). This EUA will remain  in effect (meaning this test can be used) for the duration of the COVID-19 declaration under Section 564(b)(1) of the Act, 21 U.S.C.section 360bbb-3(b)(1), unless the authorization is terminated  or revoked sooner.       Influenza A by PCR NEGATIVE NEGATIVE Final   Influenza B by PCR NEGATIVE NEGATIVE Final    Comment: (NOTE) The Xpert Xpress SARS-CoV-2/FLU/RSV plus assay is intended as an aid in the diagnosis of influenza from Nasopharyngeal swab specimens and should not be used as a sole basis for treatment. Nasal washings and aspirates are unacceptable for Xpert Xpress SARS-CoV-2/FLU/RSV testing.  Fact Sheet for Patients: EntrepreneurPulse.com.au  Fact Sheet for Healthcare Providers: IncredibleEmployment.be  This test is not yet approved or cleared by the [place] FDA and has been authorized for detection and/or diagnosis of SARS-CoV-2 by FDA under an Emergency Use Authorization (EUA). This EUA will remain in effect (meaning this test can be used) for the duration of the COVID-19 declaration under Section 564(b)(1) of the Act, 21 U.S.C. section 360bbb-3(b)(1), unless the authorization is terminated or revoked.  Performed at [facility], [place] [address], [place], [place] [address]        Radiology Studies: ECHOCARDIOGRAM COMPLETE  Result Date: 02/08/2021    ECHOCARDIOGRAM REPORT   Patient Name:   Mark Clayton Date of Exam:  02/08/2021 Medical Rec #:  [id number]      Height:       71.0 in Accession #:    [id number]     Weight:       153.7 lb Date of Birth:  02-22-1941      BSA:          1.885 m Patient Age:    75 years       BP:           101/74 mmHg Patient Gender: M              HR:  76 bpm. Exam Location:  Inpatient Procedure: 2D Echo, Cardiac Doppler, Color Doppler and Intracardiac            Opacification Agent Indications:    CHF-Acute Systolic  History:        Patient has prior history of Echocardiogram examinations, most                 recent 06/07/2019. CHF and Cardiomyopathy, CAD and Previous                 Myocardial Infarction, Defibrillator, Stroke, Arrythmias:Atrial                 Fibrillation; Risk Factors:Hypertension and Dyslipidemia.  Sonographer:    Wenda Low Referring Phys: [id number] RONDELL A SMITH IMPRESSIONS  1. Left ventricular ejection fraction, by estimation, is <20%. The left ventricle has severely decreased function. The left ventricle demonstrates global hypokinesis. The left ventricular internal cavity size was moderately dilated. Left ventricular diastolic parameters are consistent with Grade III diastolic dysfunction (restrictive).  2. Right ventricular systolic function is normal. The right ventricular size is moderately enlarged.  3. Left atrial size was severely dilated.  4. The mitral valve is normal in structure. Trivial mitral valve regurgitation. No evidence of mitral stenosis.  5. The aortic valve is tricuspid. There is moderate calcification of the aortic valve. There is moderate thickening of the aortic valve. Aortic valve regurgitation is trivial. Aortic valve sclerosis/calcification is present, without any evidence of aortic stenosis.  6. The inferior vena cava is dilated in size with <50% respiratory variability, suggesting right atrial pressure of 15 mmHg. Comparison(s): No significant change from prior study. Prior images reviewed side by side. FINDINGS  Left Ventricle: Left  ventricular ejection fraction, by estimation, is <20%. The left ventricle has severely decreased function. The left ventricle demonstrates global hypokinesis. Definity contrast agent was given IV to delineate the left ventricular endocardial borders. The left ventricular internal cavity size was moderately dilated. There is no left ventricular hypertrophy. Left ventricular diastolic parameters are consistent with Grade III diastolic dysfunction (restrictive). Right Ventricle: The right ventricular size is moderately enlarged. No increase in right ventricular wall thickness. Right ventricular systolic function is normal. Left Atrium: Left atrial size was severely dilated. Right Atrium: Right atrial size was normal in size. Pericardium: There is no evidence of pericardial effusion. Mitral Valve: The mitral valve is normal in structure. Trivial mitral valve regurgitation. No evidence of mitral valve stenosis. MV peak gradient, 2.7 mmHg. The mean mitral valve gradient is 1.0 mmHg. Tricuspid Valve: The tricuspid valve is normal in structure. Tricuspid valve regurgitation is mild . No evidence of tricuspid stenosis. Aortic Valve: The aortic valve is tricuspid. There is moderate calcification of the aortic valve. There is moderate thickening of the aortic valve. Aortic valve regurgitation is trivial. Aortic valve sclerosis/calcification is present, without any evidence of aortic stenosis. Aortic valve mean gradient measures 1.5 mmHg. Aortic valve peak gradient measures 3.0 mmHg. Aortic valve area, by VTI measures 1.86 cm. Pulmonic Valve: The pulmonic valve was normal in structure. Pulmonic valve regurgitation is trivial. No evidence of pulmonic stenosis. Aorta: The aortic root is normal in size and structure. Venous: The inferior vena cava is dilated in size with less than 50% respiratory variability, suggesting right atrial pressure of 15 mmHg. IAS/Shunts: No atrial level shunt detected by color flow Doppler. Additional  Comments: A device lead is visualized in the right ventricle.  LEFT VENTRICLE PLAX 2D LVIDd:  7.60 cm LVIDs:         6.80 cm LV PW:         0.70 cm LV IVS:        0.70 cm LVOT diam:     2.10 cm LV SV:         30 LV SV Index:   16 LVOT Area:     3.46 cm  RIGHT VENTRICLE RV Basal diam:  4.55 cm RV Mid diam:    3.60 cm RV S prime:     5.63 cm/s TAPSE (M-mode): 1.7 cm LEFT ATRIUM              Index        RIGHT ATRIUM           Index LA diam:        4.90 cm  2.60 cm/m   RA Area:     27.40 cm LA Vol (A2C):   97.3 ml  51.61 ml/m  RA Volume:   96.50 ml  51.19 ml/m LA Vol (A4C):   122.0 ml 64.72 ml/m LA Biplane Vol: 119.0 ml 63.13 ml/m  AORTIC VALVE                    PULMONIC VALVE AV Area (Vmax):    2.02 cm     PV Vmax:       0.43 m/s AV Area (Vmean):   1.68 cm     PV Peak grad:  0.7 mmHg AV Area (VTI):     1.86 cm AV Vmax:           86.20 cm/s AV Vmean:          57.250 cm/s AV VTI:            0.161 m AV Peak Grad:      3.0 mmHg AV Mean Grad:      1.5 mmHg LVOT Vmax:         50.35 cm/s LVOT Vmean:        27.700 cm/s LVOT VTI:          0.086 m LVOT/AV VTI ratio: 0.54  AORTA Ao Root diam: 3.10 cm MITRAL VALVE               TRICUSPID VALVE MV Area (PHT): 4.89 cm    TR Peak grad:   31.1 mmHg MV Area VTI:   1.48 cm    TR Vmax:        279.00 cm/s MV Peak grad:  2.7 mmHg MV Mean grad:  1.0 mmHg    SHUNTS MV Vmax:       0.83 m/s    Systemic VTI:  0.09 m MV Vmean:      43.6 cm/s   Systemic Diam: 2.10 cm MV Decel Time: 155 msec MV E velocity: 81.40 cm/s Candee Furbish MD Electronically signed by Candee Furbish MD Signature Date/Time: 02/08/2021/4:02:46 PM    Final     Scheduled Meds:  atorvastatin  80 mg Oral QHS   carvedilol  3.125 mg Oral BID WC   divalproex  500 mg Oral QHS   ferrous sulfate  325 mg Oral Q breakfast   furosemide  40 mg Intravenous BID   pantoprazole  40 mg Oral Daily   rivaroxaban  15 mg Oral Q supper   senna-docusate  2 tablet Oral QHS   sodium chloride flush  3 mL Intravenous Q12H    traZODone  100 mg Oral QHS  Continuous Infusions:  sodium chloride       LOS: 2 days   Time spent: 30 minutes   Darliss Cheney, MD [facility]  02/10/2021, 11:48 AM  Please page via [facility] and do not message via secure chat for anything urgent. Secure chat can be used for anything non urgent.  How to contact the [facility] Attending or Consulting provider [place] or covering provider during after hours [place], for this patient?  Check the care team in [facility] and look for a) attending/consulting [facility] provider listed and b) the [facility] team listed. Page or secure chat [phone]. Log into [email or web address] and use [place]'s universal password to access. If you do not have the password, please contact the hospital operator. Locate the [facility] provider you are looking for under [facility] and page to a number that you can be directly reached. If you still have difficulty reaching the provider, please page the [facility] (Director on Call) for the Hospitalists listed on amion for assistance.

## 2021-02-10 NOTE — Progress Notes (Signed)
Pharmacist Heart Failure Core Measure Documentation  Assessment: PAX REASONER has an EF documented as <20% on 02/08/21 by ECHO.  Rationale: Heart failure patients with left ventricular systolic dysfunction (LVSD) and an EF < 40% should be prescribed an angiotensin converting enzyme inhibitor (ACEI) or angiotensin receptor blocker (ARB) at discharge unless a contraindication is documented in the medical record.  This patient is not currently on an ACEI or ARB for HF.  This note is being placed in the record in order to provide documentation that a contraindication to the use of these agents is present for this encounter.  ACE Inhibitor or Angiotensin Receptor Blocker is contraindicated (specify all that apply)  []   ACEI allergy AND ARB allergy []   Angioedema []   Moderate or severe aortic stenosis []   Hyperkalemia [x]   Hypotension []   Renal artery stenosis [x]   Worsening renal function, preexisting renal disease or dysfunction   Marguerite Olea, BCCP Clinical Pharmacist  02/10/2021 2:43 PM   [facility] pharmacy phone numbers are listed on [email or web address]

## 2021-02-10 NOTE — TOC Benefit Eligibility Note (Addendum)
Patient Teacher, English as a foreign language completed.    The patient is currently admitted and upon discharge could be taking Farxiga 10mg .  The current 30 day co-pay is, $146.78 due to pt. being In coverage gap.   The patient is currently admitted and upon discharge could be taking Jardiance 10mg .  The current 30 day co-pay is, $152.57 due to pt. being In coverage gap.   The patient is insured through [place] Patient Advocate Specialist [place] Patient Advocate Team Direct Number: [phone]  Fax: [phone]

## 2021-02-10 NOTE — Progress Notes (Signed)
Progress Note  Patient Name: Mark Clayton Date of Encounter: 02/10/2021  Primary Cardiologist: Candee Furbish, MD   Subjective   Sitting up in bedside chair, RN at bedside. Feeling poorly but volume status subjectively improved per patient.  Inpatient Medications    Scheduled Meds:  atorvastatin  80 mg Oral QHS   carvedilol  3.125 mg Oral BID WC   divalproex  500 mg Oral QHS   ferrous sulfate  325 mg Oral Q breakfast   furosemide  40 mg Intravenous BID   pantoprazole  40 mg Oral Daily   rivaroxaban  15 mg Oral Q supper   senna-docusate  2 tablet Oral QHS   sodium chloride flush  3 mL Intravenous Q12H   traZODone  100 mg Oral QHS   Continuous Infusions:  sodium chloride     PRN Meds: sodium chloride, acetaminophen, sodium chloride flush   Vital Signs    Vitals:   02/09/21 0840 02/09/21 1806 02/09/21 2039 02/10/21 0300  BP: 99/65 (!) 97/55 95/69   Pulse: 76 73 75   Resp: 15  17   Temp: (!) 97.5 F (36.4 C)  (!) 97.5 F (36.4 C)   TempSrc: Oral  Oral   SpO2: 100%  99%   Weight:    65.2 kg  Height:        Intake/Output Summary (Last 24 hours) at 02/10/2021 0952 Last data filed at 02/10/2021 0938 Gross per 24 hour  Intake 723 ml  Output 1550 ml  Net -827 ml   Filed Weights   02/08/21 0456 02/09/21 0515 02/10/21 0300  Weight: 69.7 kg 67.5 kg 65.2 kg    Telemetry    Afib, however at times looks sinus. NSVT. - Personally Reviewed  ECG    Afib LBBB - Personally Reviewed  Physical Exam   GEN: No acute distress.   Neck: JVD to mid 1/3 of neck sitting upright. Cardiac: regular rhythm, normal rate, no murmurs, rubs, or gallops.  Respiratory: Clear to auscultation bilaterally. GI: Soft, nontender, non-distended  MS: 1+ edema; No deformity. Neuro:  Nonfocal  Psych: Normal affect   Labs    Chemistry Recent Labs  Lab 02/07/21 2227 02/09/21 0412 02/10/21 0319  NA 134* 131* 134*  K 5.1 3.3* 3.5  CL 103 97* 101  CO2 24 25 25   GLUCOSE 97 95 91   BUN 26* 21 21  CREATININE 1.37* 1.24 1.21  CALCIUM 8.3* 8.0* 8.3*  GFRNONAA 52* 59* >60  ANIONGAP 7 9 8      Hematology Recent Labs  Lab 02/07/21 2227 02/09/21 0412  WBC 5.4 4.8  RBC 4.23 3.90*  HGB 12.7* 11.8*  HCT 40.5 37.1*  MCV 95.7 95.1  MCH 30.0 30.3  MCHC 31.4 31.8  RDW 20.5* 19.9*  PLT 127* 121*    Cardiac EnzymesNo results for input(s): TROPONINI in the last 168 hours. No results for input(s): TROPIPOC in the last 168 hours.   BNP Recent Labs  Lab 02/07/21 2254  BNP 2,099.8*     DDimer No results for input(s): DDIMER in the last 168 hours.   Radiology    ECHOCARDIOGRAM COMPLETE  Result Date: 02/08/2021    ECHOCARDIOGRAM REPORT   Patient Name:   Mark Clayton Date of Exam: 02/08/2021 Medical Rec #:  [id number]      Height:       71.0 in Accession #:    [id number]     Weight:       153.7 lb Date of  Birth:  31-Jul-1940      BSA:          1.885 m Patient Age:    80 years       BP:           101/74 mmHg Patient Gender: M              HR:           76 bpm. Exam Location:  Inpatient Procedure: 2D Echo, Cardiac Doppler, Color Doppler and Intracardiac            Opacification Agent Indications:    CHF-Acute Systolic  History:        Patient has prior history of Echocardiogram examinations, most                 recent 06/07/2019. CHF and Cardiomyopathy, CAD and Previous                 Myocardial Infarction, Defibrillator, Stroke, Arrythmias:Atrial                 Fibrillation; Risk Factors:Hypertension and Dyslipidemia.  Sonographer:    Wenda Low Referring Phys: [id number] RONDELL A SMITH IMPRESSIONS  1. Left ventricular ejection fraction, by estimation, is <20%. The left ventricle has severely decreased function. The left ventricle demonstrates global hypokinesis. The left ventricular internal cavity size was moderately dilated. Left ventricular diastolic parameters are consistent with Grade III diastolic dysfunction (restrictive).  2. Right ventricular systolic function is  normal. The right ventricular size is moderately enlarged.  3. Left atrial size was severely dilated.  4. The mitral valve is normal in structure. Trivial mitral valve regurgitation. No evidence of mitral stenosis.  5. The aortic valve is tricuspid. There is moderate calcification of the aortic valve. There is moderate thickening of the aortic valve. Aortic valve regurgitation is trivial. Aortic valve sclerosis/calcification is present, without any evidence of aortic stenosis.  6. The inferior vena cava is dilated in size with <50% respiratory variability, suggesting right atrial pressure of 15 mmHg. Comparison(s): No significant change from prior study. Prior images reviewed side by side. FINDINGS  Left Ventricle: Left ventricular ejection fraction, by estimation, is <20%. The left ventricle has severely decreased function. The left ventricle demonstrates global hypokinesis. Definity contrast agent was given IV to delineate the left ventricular endocardial borders. The left ventricular internal cavity size was moderately dilated. There is no left ventricular hypertrophy. Left ventricular diastolic parameters are consistent with Grade III diastolic dysfunction (restrictive). Right Ventricle: The right ventricular size is moderately enlarged. No increase in right ventricular wall thickness. Right ventricular systolic function is normal. Left Atrium: Left atrial size was severely dilated. Right Atrium: Right atrial size was normal in size. Pericardium: There is no evidence of pericardial effusion. Mitral Valve: The mitral valve is normal in structure. Trivial mitral valve regurgitation. No evidence of mitral valve stenosis. MV peak gradient, 2.7 mmHg. The mean mitral valve gradient is 1.0 mmHg. Tricuspid Valve: The tricuspid valve is normal in structure. Tricuspid valve regurgitation is mild . No evidence of tricuspid stenosis. Aortic Valve: The aortic valve is tricuspid. There is moderate calcification of the aortic  valve. There is moderate thickening of the aortic valve. Aortic valve regurgitation is trivial. Aortic valve sclerosis/calcification is present, without any evidence of aortic stenosis. Aortic valve mean gradient measures 1.5 mmHg. Aortic valve peak gradient measures 3.0 mmHg. Aortic valve area, by VTI measures 1.86 cm. Pulmonic Valve: The pulmonic valve was normal in structure. Pulmonic valve  regurgitation is trivial. No evidence of pulmonic stenosis. Aorta: The aortic root is normal in size and structure. Venous: The inferior vena cava is dilated in size with less than 50% respiratory variability, suggesting right atrial pressure of 15 mmHg. IAS/Shunts: No atrial level shunt detected by color flow Doppler. Additional Comments: A device lead is visualized in the right ventricle.  LEFT VENTRICLE PLAX 2D LVIDd:         7.60 cm LVIDs:         6.80 cm LV PW:         0.70 cm LV IVS:        0.70 cm LVOT diam:     2.10 cm LV SV:         30 LV SV Index:   16 LVOT Area:     3.46 cm  RIGHT VENTRICLE RV Basal diam:  4.55 cm RV Mid diam:    3.60 cm RV S prime:     5.63 cm/s TAPSE (M-mode): 1.7 cm LEFT ATRIUM              Index        RIGHT ATRIUM           Index LA diam:        4.90 cm  2.60 cm/m   RA Area:     27.40 cm LA Vol (A2C):   97.3 ml  51.61 ml/m  RA Volume:   96.50 ml  51.19 ml/m LA Vol (A4C):   122.0 ml 64.72 ml/m LA Biplane Vol: 119.0 ml 63.13 ml/m  AORTIC VALVE                    PULMONIC VALVE AV Area (Vmax):    2.02 cm     PV Vmax:       0.43 m/s AV Area (Vmean):   1.68 cm     PV Peak grad:  0.7 mmHg AV Area (VTI):     1.86 cm AV Vmax:           86.20 cm/s AV Vmean:          57.250 cm/s AV VTI:            0.161 m AV Peak Grad:      3.0 mmHg AV Mean Grad:      1.5 mmHg LVOT Vmax:         50.35 cm/s LVOT Vmean:        27.700 cm/s LVOT VTI:          0.086 m LVOT/AV VTI ratio: 0.54  AORTA Ao Root diam: 3.10 cm MITRAL VALVE               TRICUSPID VALVE MV Area (PHT): 4.89 cm    TR Peak grad:   31.1 mmHg  MV Area VTI:   1.48 cm    TR Vmax:        279.00 cm/s MV Peak grad:  2.7 mmHg MV Mean grad:  1.0 mmHg    SHUNTS MV Vmax:       0.83 m/s    Systemic VTI:  0.09 m MV Vmean:      43.6 cm/s   Systemic Diam: 2.10 cm MV Decel Time: 155 msec MV E velocity: 81.40 cm/s Candee Furbish MD Electronically signed by Candee Furbish MD Signature Date/Time: 02/08/2021/4:02:46 PM    Final     Cardiac Studies   As above  Patient Profile     80  y.o. male with a hx of (HFrEF) heart failure with reduced ejection fraction due to ischemic CM with EF < 20, coronary artery disease s/p CABG in 2003, DES to the native RCA and DES to S-RPDA (jump graft to RPL chronically occluded), permanent atrial fibrillation on chronic anticoagulation, VT, s/p ICD, prior LV mural thrombus resolved on f/u studies, chronic kidney disease, DNR, hypertension, hyperlipidemia here for congestive heart failure.  Assessment & Plan   Principal Problem:   Acute on chronic systolic CHF (congestive heart failure) (HCC) Active Problems:   Hyperlipidemia   Cardiac defibrillator  MDT VVI   Persistent atrial fibrillation   Coronary artery disease involving native heart with angina pectoris (HCC)   Essential hypertension   Chronic anticoagulation   CHF (congestive heart failure) (HCC)   Acute systolic heart failure - Cachectic ill-appearing.  volume exam improving.  -Back in 2020 and PCI to a saphenous vein graft to PDA. -Continue with Lasix 40 mg IV twice daily, may be able to transition to oral tomorrow if going home. -Good overall diuresis yesterday. Unable to utilize traditional goal-directed medical therapy   CAD - CABG 2003 stent to RCA in 2016 and stent to SVG vein graft in 2020.  No anginal symptoms.  No aspirin because of Xarelto.   Permanent atrial fibrillation - Currently well rate controlled on Xarelto 15 mg for chronic anticoagulation based on GFR less than 50.   DNR - Agree.  Palliative care team has been consulted, patient not  ready for hospice, hopes to go home with [facility].      For questions or updates, please contact [place] Please consult [email or web address] for contact info under        Signed, Elouise Munroe, MD  02/10/2021, 9:52 AM

## 2021-02-10 NOTE — Evaluation (Signed)
Occupational Therapy Evaluation Patient Details Name: Mark Clayton MRN: [id number] DOB: 1940/05/21 Today's Date: 02/10/2021   History of Present Illness Pt is an 80 y.o. male who presented 02/07/21 with lower extremity edema and swelling into his abdomen along with malaise, cough, orthopnea, and wheezing. Chest x-ray noted cardiomegaly with small bilateral pleural effusions. Pt admitted with acute on chronic systolic congestive heart failure. PMH: HTN, HLD, PAF on chronic anticoagulation, s/p AICD, LV thrombus, ICM, CAD s/p CABG, CVA, colon cancer s/p sigmoid colon resection   Clinical Impression   This 80 yo male admitted with above presents to acute OT with PLOF of needing someone with him anytime he is up on his feet due to prior CVA and A for LB ADLs/toileting but could do grooming and UBB/D. He currently is setup-total A for basic ADLs. He will continue to benefit from acute OT with followup at [facility].      Recommendations for follow up therapy are one component of a multi-disciplinary discharge planning process, led by the attending physician.  Recommendations may be updated based on patient status, additional functional criteria and insurance authorization.   Follow Up Recommendations  Home health OT    Assistance Recommended at Discharge Frequent or constant Supervision/Assistance  Functional Status Assessment  Patient has had a recent decline in their functional status and demonstrates the ability to make significant improvements in function in a reasonable and predictable amount of time.  Equipment Recommendations  None recommended by OT       Precautions / Restrictions Precautions Precautions: Fall Restrictions Weight Bearing Restrictions: No      Mobility Bed Mobility Overal bed mobility: Needs Assistance Bed Mobility: Supine to Sit     Supine to sit: Min assist;HOB elevated     General bed mobility comments: A for RLE only    Transfers Overall transfer level:  Needs assistance Equipment used: Rolling walker (2 wheels) Transfers: Sit to/from Stand Sit to Stand: Min assist           General transfer comment: Pt ambulated to the door and back x1 (~20 feet total)--said he wanted to go again, but then was too fatigued.      Balance Overall balance assessment: Needs assistance Sitting-balance support: No upper extremity supported;Feet supported Sitting balance-Leahy Scale: Good     Standing balance support: Reliant on assistive device for balance Standing balance-Leahy Scale: Poor                             ADL either performed or assessed with clinical judgement   ADL Overall ADL's : Needs assistance/impaired Eating/Feeding: Set up;Sitting   Grooming: Wash/dry hands;Wash/dry face;Oral care;Brushing hair;Set up;Sitting Grooming Details (indicate cue type and reason): EOB Upper Body Bathing: Minimal assistance;Sitting   Lower Body Bathing: Moderate assistance Lower Body Bathing Details (indicate cue type and reason): min A sit<>stand Upper Body Dressing : Moderate assistance;Sitting   Lower Body Dressing: Maximal assistance Lower Body Dressing Details (indicate cue type and reason): min A sit<>stand Toilet Transfer: Minimal assistance;Ambulation;Rolling walker (2 wheels);BSC/3in1   Toileting- Clothing Manipulation and Hygiene: Total assistance Toileting - Clothing Manipulation Details (indicate cue type and reason): min A si<>stand             Vision Baseline Vision/History: 1 Wears glasses Ability to See in Adequate Light: 0 Adequate Additional Comments: h/o vision loss            Pertinent Vitals/Pain Pain Assessment: No/denies pain ("stiff")  Hand Dominance Right (due to CVA)   Extremity/Trunk Assessment Upper Extremity Assessment Upper Extremity Assessment: RUE deficits/detail RUE Deficits / Details: old CVA, can use somewhat functionally RUE Coordination: decreased fine motor;decreased gross  motor           Communication Communication Communication: No difficulties   Cognition Arousal/Alertness: Awake/alert   Overall Cognitive Status: Within Functional Limits for tasks assessed                                                  Home Living Family/patient expects to be discharged to:: Private residence Living Arrangements: Children (dtrs and grand-dtr) Available Help at Discharge: Family;Available 24 hours/day Type of Home: House Home Access: Level entry     Home Layout: One level;Laundry or work area in basement     [employer] Shower/Tub: Teacher, early years/pre: Standard Bathroom Accessibility: Yes   Home Equipment: Conservation officer, nature (2 wheels);Tub bench;Grab bars - toilet;Grab bars - tub/shower;Hospital bed          Prior Functioning/Environment Prior Level of Function : Needs assist       Physical Assist : Mobility (physical);ADLs (physical) Mobility (physical): Bed mobility;Transfers;Gait ADLs (physical): Bathing;Dressing;Toileting;IADLs Mobility Comments: Pt requires assistance to manage R leg for bed mobility, intermittent assist for transfers, and a follow for gait with RW. ADLs Comments: Pt needs some assistance for bathing and dressing bottom half and with pericare.        OT Problem List: Decreased range of motion;Decreased strength;Impaired balance (sitting and/or standing);Impaired UE functional use      OT Treatment/Interventions: Self-care/ADL training;DME and/or AE instruction;Patient/family education;Balance training    OT Goals(Current goals can be found in the care plan section) Acute Rehab OT Goals Patient Stated Goal: to get better and go home OT Goal Formulation: With patient Time For Goal Achievement: 02/24/21 Potential to Achieve Goals: Good  OT Frequency: Min 2X/week              AM-PAC OT "6 Clicks" Daily Activity     Outcome Measure Help from another person eating meals?: A Little Help  from another person taking care of personal grooming?: A Little Help from another person toileting, which includes using toliet, bedpan, or urinal?: A Lot Help from another person bathing (including washing, rinsing, drying)?: A Lot Help from another person to put on and taking off regular upper body clothing?: A Lot Help from another person to put on and taking off regular lower body clothing?: Total 6 Click Score: 1   End of Session Equipment Utilized During Treatment: Rolling walker (2 wheels) Nurse Communication:  (NT: pt had a large bowel movement)  Activity Tolerance: Patient tolerated treatment well Patient left: in chair;with call bell/phone within reach;with chair alarm set  OT Visit Diagnosis: Unsteadiness on feet (R26.81);Other abnormalities of gait and mobility (R26.89);Muscle weakness (generalized) (M62.81)                Time: 1700-1749 OT Time Calculation (min): 44 min Charges:  OT General Charges $OT Visit: 1 Visit OT Evaluation $OT Eval Moderate Complexity: 1 Mod OT Treatments $Self Care/Home Management : 23-37 mins  [place] [address], OTR/L Acute [employer] Pager [phone] Office [phone]    Almon Register 02/10/2021, 9:39 AM

## 2021-02-11 DIAGNOSIS — I5023 Acute on chronic systolic (congestive) heart failure: Secondary | ICD-10-CM | POA: Diagnosis not present

## 2021-02-11 LAB — BASIC METABOLIC PANEL
Anion gap: 7 (ref 5–15)
BUN: 23 mg/dL (ref 8–23)
CO2: 28 mmol/L (ref 22–32)
Calcium: 8.7 mg/dL — ABNORMAL LOW (ref 8.9–10.3)
Chloride: 101 mmol/L (ref 98–111)
Creatinine, Ser: 1.44 mg/dL — ABNORMAL HIGH (ref 0.61–1.24)
GFR, Estimated: 49 mL/min — ABNORMAL LOW (ref >60)
Glucose, Bld: 98 mg/dL (ref 70–99)
Potassium: 4.6 mmol/L (ref 3.5–5.1)
Sodium: 136 mmol/L (ref 135–145)

## 2021-02-11 MED ORDER — FUROSEMIDE 40 MG PO TABS
40.0000 mg | ORAL_TABLET | Freq: Two times a day (BID) | ORAL | Status: DC
  Administered 2021-02-11 – 2021-02-12 (×2): 40 mg via ORAL
  Filled 2021-02-11 (×2): qty 1

## 2021-02-11 NOTE — Progress Notes (Signed)
Progress Note  Patient Name: Mark Clayton Date of Encounter: 02/11/2021  Primary Cardiologist: Candee Furbish, MD   Subjective   NAE  Inpatient Medications    Scheduled Meds:  atorvastatin  80 mg Oral QHS   carvedilol  3.125 mg Oral BID WC   divalproex  500 mg Oral QHS   ferrous sulfate  325 mg Oral Q breakfast   furosemide  40 mg Intravenous BID   pantoprazole  40 mg Oral Daily   rivaroxaban  15 mg Oral Q supper   senna-docusate  2 tablet Oral QHS   sodium chloride flush  3 mL Intravenous Q12H   traZODone  100 mg Oral QHS   Continuous Infusions:  sodium chloride     PRN Meds: sodium chloride, acetaminophen, sodium chloride flush   Vital Signs    Vitals:   02/10/21 0300 02/10/21 1100 02/10/21 2021 02/11/21 0415  BP:  100/75 (!) 93/57 101/69  Pulse:  65 74 90  Resp:  17 16 19   Temp:   97.6 F (36.4 C) 97.6 F (36.4 C)  TempSrc:   Oral Oral  SpO2:  98% 100% 100%  Weight: 65.2 kg   66.9 kg  Height:        Intake/Output Summary (Last 24 hours) at 02/11/2021 0604 Last data filed at 02/11/2021 0420 Gross per 24 hour  Intake 1023 ml  Output 1550 ml  Net -527 ml   Filed Weights   02/09/21 0515 02/10/21 0300 02/11/21 0415  Weight: 67.5 kg 65.2 kg 66.9 kg    Telemetry    Afib, NSVT. - Personally Reviewed  ECG    Afib LBBB - Personally Reviewed  Physical Exam   GEN: No acute distress.   Neck: No JVD Cardiac: RRR, no murmurs, rubs, or gallops.  Respiratory: Clear to auscultation bilaterally. GI: Soft, nontender, non-distended  MS: mild bilateral edema; No deformity. Neuro:  Nonfocal  Psych: Normal affect    Labs    Chemistry Recent Labs  Lab 02/09/21 0412 02/10/21 0319 02/11/21 0257  NA 131* 134* 136  K 3.3* 3.5 4.6  CL 97* 101 101  CO2 25 25 28   GLUCOSE 95 91 98  BUN 21 21 23   CREATININE 1.24 1.21 1.44*  CALCIUM 8.0* 8.3* 8.7*  GFRNONAA 59* >60 49*  ANIONGAP 9 8 7      Hematology Recent Labs  Lab 02/07/21 2227 02/09/21 0412   WBC 5.4 4.8  RBC 4.23 3.90*  HGB 12.7* 11.8*  HCT 40.5 37.1*  MCV 95.7 95.1  MCH 30.0 30.3  MCHC 31.4 31.8  RDW 20.5* 19.9*  PLT 127* 121*    Cardiac EnzymesNo results for input(s): TROPONINI in the last 168 hours. No results for input(s): TROPIPOC in the last 168 hours.   BNP Recent Labs  Lab 02/07/21 2254  BNP 2,099.8*     DDimer No results for input(s): DDIMER in the last 168 hours.   Radiology    No results found.  Cardiac Studies   As above  Patient Profile     80 y.o. male with a hx of (HFrEF) heart failure with reduced ejection fraction due to ischemic CM with EF < 20, coronary artery disease s/p CABG in 2003, DES to the native RCA and DES to S-RPDA (jump graft to RPL chronically occluded), permanent atrial fibrillation on chronic anticoagulation, VT, s/p ICD, prior LV mural thrombus resolved on f/u studies, chronic kidney disease, DNR, hypertension, hyperlipidemia here for congestive heart failure.  Assessment & Plan  Principal Problem:   Acute on chronic systolic CHF (congestive heart failure) (HCC) Active Problems:   Hyperlipidemia   Cardiac defibrillator  MDT VVI   Persistent atrial fibrillation   Coronary artery disease involving native heart with angina pectoris (HCC)   Essential hypertension   Chronic anticoagulation   CHF (congestive heart failure) (HCC)   Acute systolic heart failure - Cachectic ill-appearing.  volume exam improved.  - Transition to lasix 40 mg po BID and will have close TOC follow up with HF clinic. Diuresing reasonably, Cr with slight rise today.  -Unable to utilize traditional goal-directed medical therapy   CAD - CABG 2003 stent to RCA in 2016 and stent to SVG vein graft in 2020.  No anginal symptoms.  No aspirin because of Xarelto.   Permanent atrial fibrillation - Currently well rate controlled. - on Xarelto 15 mg for chronic anticoagulation based on GFR less than 50.   DNR - Agree.  Palliative care team has been  consulted, patient not ready for hospice, hopes to go home with [facility] and palliative referral.      For questions or updates, please contact [place] Please consult [email or web address] for contact info under        Signed, Elouise Munroe, MD  02/11/2021, 6:04 AM

## 2021-02-11 NOTE — Progress Notes (Addendum)
PROGRESS NOTE    Mark Clayton  GLO:[id number] DOB: [birth year] DOA: 02/07/2021 PCP: [name], MD   Brief Narrative:  Mark Clayton is a 80 y.o. male with medical history significant of HTN, HLD, PAF on chronic anticoagulation, s/p AICD, LV thrombus, ICM, CAD s/p CABG, CVA, colon cancer s/p sigmoid colon resection who presented with complaints of progressively worsening swelling over the last 2 -3 weeks.  Normally patient ambulates with the use of a rolling walker.  He had been having difficulty getting around at home due to symptoms.  Noted swelling all the way into his abdomen which was causing him discomfort.  Patient was seen last by his primary care provider on 10/31 due to symptoms.  He had increased his dose of Lasix from 40 to 60 mg daily for at least the last 2 weeks, but had not noticed any significant change.  Associated symptoms included orthopnea, wheezing(reported by daughter), nonproductive cough, and malaise.   BNP 2099.8.  Chest x-ray noted cardiomegaly with small bilateral pleural effusions.  Influenza and COVID-19 screening were negative.  Patient was given 60 mg of Lasix IV x1 dose.  Admitted under [place].    Assessment & Plan:   Principal Problem:   Acute on chronic systolic CHF (congestive heart failure) (HCC) Active Problems:   Hyperlipidemia   Cardiac defibrillator  MDT VVI   Persistent atrial fibrillation   Coronary artery disease involving native heart with angina pectoris [facility])   Essential hypertension   Chronic anticoagulation   CHF (congestive heart failure) (HCC)  Acute on chronic combined systolic and diastolic congestive heart failure: BNP of 2099.8.  Chest x-ray noted cardiomegaly with small bilateral pleural effusions.  Last echocardiogram noted EF of less than 20% with global hypokinesis in 05/2019 and grade 2 diastolic dysfunction. He was initially given 60 mg Lasix IV in the ED with output of at least 1 L and improvement in symptoms.  Seen by  cardiology.  Started on Lasix IV 40 mg twice daily.  Net -4.6 L since admission.  Still has 3+ pitting edema but improved compared to yesterday.  Per cardiology, will transition to Lasix 40 mg p.o. twice daily.  Patient already received IV this morning so we will initiate first dose from afternoon.  Strict I's and O's, low-sodium diet.  Daily weight.  Echo with no change.  Appreciate cardiology for managing this.   Permanent atrial fibrillation on chronic anticoagulation: Rate controlled.  Continue Xarelto and Coreg   Essential hypertension: Blood pressure soft.  Continue home Coreg 3.125 mg p.o. twice daily.    CAD s/p CABG: Last cardiac cath from 11/22/2018 noted multivessel coronary artery disease with successful PCI to SVG to RPDA with a drug-eluting stent.  Asymptomatic.   History of VT ischemic cardiomyopathy s/p AICD -Orders placed to have AICD interrogated   Hyponatremia: Acute.  Suspect secondary to patient being fluid overload.  Now resolved.  Continue to treat CHF.   Chronic kidney disease stage IIIa: Slight jump in creatinine but is still falls under CKD stage IIIa.  Will observe overnight and repeat labs in the morning while he is being transitioned on oral Lasix.   Hyperlipidemia -Continue atorvastatin   GERD -Continue Protonix   DVT prophylaxis:    Code Status: DNR  Family Communication:  None present at bedside.  Plan of care discussed with patient in length and he verbalized understanding and agreed with it.  Status is: Inpatient  Remains inpatient appropriate because: Needs further IV diuresis.  Estimated body mass index is 20.57 kg/m as calculated from the following:   Height as of this encounter: 5\' 11"  (1.803 m).   Weight as of this encounter: 66.9 kg.     Nutritional Assessment: Body mass index is 20.57 kg/m.Marland Kitchen Seen by dietician.  I agree with the assessment and plan as outlined below: Nutrition Status:  Skin Assessment: I have examined the patient's  skin and I agree with the wound assessment as performed by the wound care RN as outlined below:    Consultants:  Cardiology  Procedures:  None  Antimicrobials:  Anti-infectives (From admission, onward)    None          Subjective: Patient seen and examined.  He has no complaints.  General exam: Appears calm and comfortable   Objective: Vitals:   02/10/21 1100 02/10/21 2021 02/11/21 0415 02/11/21 0756  BP: 100/75 (!) 93/57 101/69 105/63  Pulse: 65 74 90 87  Resp: 17 16 19 18   Temp:  97.6 F (36.4 C) 97.6 F (36.4 C) (!) 97.3 F (36.3 C)  TempSrc:  Oral Oral Oral  SpO2: 98% 100% 100% 99%  Weight:   66.9 kg   Height:        Intake/Output Summary (Last 24 hours) at 02/11/2021 1006 Last data filed at 02/11/2021 0600 Gross per 24 hour  Intake 543 ml  Output 1550 ml  Net -1007 ml    Filed Weights   02/09/21 0515 02/10/21 0300 02/11/21 0415  Weight: 67.5 kg 65.2 kg 66.9 kg    Examination:  General exam: Appears calm and comfortable  Respiratory system: Clear to auscultation. Respiratory effort normal. Cardiovascular system: S1 & S2 heard, RRR. No JVD, murmurs, rubs, gallops or clicks.  +3 pitting edema bilateral lower extremity Gastrointestinal system: Abdomen is nondistended, soft and nontender. No organomegaly or masses felt. Normal bowel sounds heard. Central nervous system: Alert and oriented. No focal neurological deficits. Extremities: Symmetric 5 x 5 power. Skin: No rashes, lesions or ulcers.     Data Reviewed: I have personally reviewed following labs and imaging studies  CBC: Recent Labs  Lab 02/07/21 2227 02/09/21 0412  WBC 5.4 4.8  NEUTROABS  --  3.0  HGB 12.7* 11.8*  HCT 40.5 37.1*  MCV 95.7 95.1  PLT 127* 121*    Basic Metabolic Panel: Recent Labs  Lab 02/07/21 2227 02/07/21 2253 02/09/21 0412 02/10/21 0319 02/11/21 0257  NA 134*  --  131* 134* 136  K 5.1  --  3.3* 3.5 4.6  CL 103  --  97* 101 101  CO2 24  --  25 25 28    GLUCOSE 97  --  95 91 98  BUN 26*  --  21 21 23   CREATININE 1.37*  --  1.24 1.21 1.44*  CALCIUM 8.3*  --  8.0* 8.3* 8.7*  PHOS  --  3.3  --   --   --     GFR: Estimated Creatinine Clearance: 38.7 mL/min (A) (by C-G formula based on SCr of 1.44 mg/dL (H)). Liver Function Tests: No results for input(s): AST, ALT, ALKPHOS, BILITOT, PROT, ALBUMIN in the last 168 hours. No results for input(s): LIPASE, AMYLASE in the last 168 hours. No results for input(s): AMMONIA in the last 168 hours. Coagulation Profile: Recent Labs  Lab 02/07/21 2227  INR 2.8*    Cardiac Enzymes: No results for input(s): CKTOTAL, CKMB, CKMBINDEX, TROPONINI in the last 168 hours. BNP (last 3 results) Recent Labs    01/27/21 1144  PROBNP 1,995.0*    HbA1C: No results for input(s): HGBA1C in the last 72 hours. CBG: No results for input(s): GLUCAP in the last 168 hours. Lipid Profile: No results for input(s): CHOL, HDL, LDLCALC, TRIG, CHOLHDL, LDLDIRECT in the last 72 hours. Thyroid Function Tests: No results for input(s): TSH, T4TOTAL, FREET4, T3FREE, THYROIDAB in the last 72 hours. Anemia Panel: No results for input(s): VITAMINB12, FOLATE, FERRITIN, TIBC, IRON, RETICCTPCT in the last 72 hours. Sepsis Labs: No results for input(s): PROCALCITON, LATICACIDVEN in the last 168 hours.  Recent Results (from the past 240 hour(s))  Resp Panel by RT-PCR (Flu A&B, Covid) Nasopharyngeal Swab     Status: None   Collection Time: 02/07/21 10:28 PM   Specimen: Nasopharyngeal Swab; Nasopharyngeal(NP) swabs in vial transport medium  Result Value Ref Range Status   SARS Coronavirus 2 by RT PCR NEGATIVE NEGATIVE Final    Comment: (NOTE) SARS-CoV-2 target nucleic acids are NOT DETECTED.  The SARS-CoV-2 RNA is generally detectable in upper respiratory specimens during the acute phase of infection. The lowest concentration of SARS-CoV-2 viral copies this assay can detect is 138 copies/mL. A negative result does not  preclude SARS-Cov-2 infection and should not be used as the sole basis for treatment or other patient management decisions. A negative result may occur with  improper specimen collection/handling, submission of specimen other than nasopharyngeal swab, presence of viral mutation(s) within the areas targeted by this assay, and inadequate number of viral copies(<138 copies/mL). A negative result must be combined with clinical observations, patient history, and epidemiological information. The expected result is Negative.  Fact Sheet for Patients:  EntrepreneurPulse.com.au  Fact Sheet for Healthcare Providers:  IncredibleEmployment.be  This test is no t yet approved or cleared by the [place] FDA and  has been authorized for detection and/or diagnosis of SARS-CoV-2 by FDA under an Emergency Use Authorization (EUA). This EUA will remain  in effect (meaning this test can be used) for the duration of the COVID-19 declaration under Section 564(b)(1) of the Act, 21 U.S.C.section 360bbb-3(b)(1), unless the authorization is terminated  or revoked sooner.       Influenza A by PCR NEGATIVE NEGATIVE Final   Influenza B by PCR NEGATIVE NEGATIVE Final    Comment: (NOTE) The Xpert Xpress SARS-CoV-2/FLU/RSV plus assay is intended as an aid in the diagnosis of influenza from Nasopharyngeal swab specimens and should not be used as a sole basis for treatment. Nasal washings and aspirates are unacceptable for Xpert Xpress SARS-CoV-2/FLU/RSV testing.  Fact Sheet for Patients: EntrepreneurPulse.com.au  Fact Sheet for Healthcare Providers: IncredibleEmployment.be  This test is not yet approved or cleared by the [place] FDA and has been authorized for detection and/or diagnosis of SARS-CoV-2 by FDA under an Emergency Use Authorization (EUA). This EUA will remain in effect (meaning this test can be used) for the  duration of the COVID-19 declaration under Section 564(b)(1) of the Act, 21 U.S.C. section 360bbb-3(b)(1), unless the authorization is terminated or revoked.  Performed at [facility], [place] [address] [place], [place] [address]        Radiology Studies: No results found.  Scheduled Meds:  atorvastatin  80 mg Oral QHS   carvedilol  3.125 mg Oral BID WC   divalproex  500 mg Oral QHS   ferrous sulfate  325 mg Oral Q breakfast   furosemide  40 mg Oral BID   pantoprazole  40 mg Oral Daily   rivaroxaban  15 mg Oral Q supper  senna-docusate  2 tablet Oral QHS   sodium chloride flush  3 mL Intravenous Q12H   traZODone  100 mg Oral QHS   Continuous Infusions:  sodium chloride       LOS: 3 days   Time spent: 28 minutes   Darliss Cheney, MD [facility]  02/11/2021, 10:06 AM  Please page via Shea Evans and do not message via secure chat for anything urgent. Secure chat can be used for anything non urgent.  How to contact the [facility] Attending or Consulting provider [place] or covering provider during after hours [place], for this patient?  Check the care team in [facility] and look for a) attending/consulting TRH provider listed and b) the [facility] team listed. Page or secure chat [phone]. Log into [email or web address] and use [place]'s universal password to access. If you do not have the password, please contact the hospital operator. Locate the [facility] provider you are looking for under [facility] and page to a number that you can be directly reached. If you still have difficulty reaching the provider, please page the [facility] (Director on Call) for the Hospitalists listed on amion for assistance.

## 2021-02-11 NOTE — Progress Notes (Signed)
PT Cancellation Note  Patient Details Name: Mark Clayton MRN: [id number] DOB: June 07, 1940   Cancelled Treatment:    Reason Eval/Treat Not Completed: (P) Patient declined, no reason specified Despite asking PT to return after he was off the phone. Pt refuses to work with therapy today. PT will attempt back tomorrow.  Aldyn Toon B. Migdalia Dk PT, DPT Acute Rehabilitation Services Pager [phone] Office [phone]   [place] 02/11/2021, 5:02 PM

## 2021-02-12 DIAGNOSIS — I5023 Acute on chronic systolic (congestive) heart failure: Secondary | ICD-10-CM | POA: Diagnosis not present

## 2021-02-12 LAB — BASIC METABOLIC PANEL
Anion gap: 8 (ref 5–15)
BUN: 26 mg/dL — ABNORMAL HIGH (ref 8–23)
CO2: 24 mmol/L (ref 22–32)
Calcium: 8.6 mg/dL — ABNORMAL LOW (ref 8.9–10.3)
Chloride: 100 mmol/L (ref 98–111)
Creatinine, Ser: 1.32 mg/dL — ABNORMAL HIGH (ref 0.61–1.24)
GFR, Estimated: 55 mL/min — ABNORMAL LOW (ref >60)
Glucose, Bld: 126 mg/dL — ABNORMAL HIGH (ref 70–99)
Potassium: 3.8 mmol/L (ref 3.5–5.1)
Sodium: 132 mmol/L — ABNORMAL LOW (ref 135–145)

## 2021-02-12 MED ORDER — FUROSEMIDE 10 MG/ML IJ SOLN: 40.0000 mg | Freq: Two times a day (BID) | INTRAMUSCULAR | Status: DC

## 2021-02-12 MED ORDER — FUROSEMIDE 10 MG/ML IJ SOLN
80.0000 mg | Freq: Two times a day (BID) | INTRAMUSCULAR | Status: DC
  Administered 2021-02-12: 40 mg via INTRAVENOUS
  Administered 2021-02-13: 09:00:00 80 mg via INTRAVENOUS
  Filled 2021-02-12 (×3): qty 8

## 2021-02-12 NOTE — Progress Notes (Signed)
PROGRESS NOTE    FARES RAMTHUN  RAQ:[id number] DOB: [birth year] DOA: 02/07/2021 PCP: [name], MD   Brief Narrative:  Mark Clayton is a 80 y.o. male with medical history significant of HTN, HLD, PAF on chronic anticoagulation, s/p AICD, LV thrombus, ICM, CAD s/p CABG, CVA, colon cancer s/p sigmoid colon resection who presented with complaints of progressively worsening swelling over the last 2 -3 weeks.  Normally patient ambulates with the use of a rolling walker.  He had been having difficulty getting around at home due to symptoms.  Noted swelling all the way into his abdomen which was causing him discomfort.  Patient was seen last by his primary care provider on 10/31 due to symptoms.  He had increased his dose of Lasix from 40 to 60 mg daily for at least the last 2 weeks, but had not noticed any significant change.  Associated symptoms included orthopnea, wheezing(reported by daughter), nonproductive cough, and malaise.   BNP 2099.8.  Chest x-ray noted cardiomegaly with small bilateral pleural effusions.  Influenza and COVID-19 screening were negative.  Patient was given 60 mg of Lasix IV x1 dose.  Admitted under [place].    Assessment & Plan:   Principal Problem:   Acute on chronic systolic CHF (congestive heart failure) (HCC) Active Problems:   Hyperlipidemia   Cardiac defibrillator  MDT VVI   Persistent atrial fibrillation   Coronary artery disease involving native heart with angina pectoris [facility])   Essential hypertension   Chronic anticoagulation   CHF (congestive heart failure) (HCC)  Acute on chronic combined systolic and diastolic congestive heart failure: BNP of 2099.8.  Chest x-ray noted cardiomegaly with small bilateral pleural effusions.  Last echocardiogram noted EF of less than 20% with global hypokinesis in 05/2019 and grade 2 diastolic dysfunction. He was initially given 60 mg Lasix IV in the ED with output of at least 1 L and improvement in symptoms.  Seen by  cardiology.  Started on Lasix IV 40 mg twice daily and then transition to oral 40 mg p.o. Lasix yesterday.  He has had only 900 cc output since yesterday.  He is significantly fluid overloaded with +3 pitting edema bilateral lower extremity and +4 pitting edema right upper extremity.  Discussed with cardiology, his renal function has improved slightly which we will be monitoring very closely, we are switching him back to Lasix IV 40 mg twice daily bolus doses, we will see if this works, if not IV infusion of Lasix is on the table for next.  Permanent atrial fibrillation on chronic anticoagulation: Rate controlled.  Continue Xarelto and Coreg   Essential hypertension: Blood pressure soft.  Continue home Coreg 3.125 mg p.o. twice daily.    CAD s/p CABG: Last cardiac cath from 11/22/2018 noted multivessel coronary artery disease with successful PCI to SVG to RPDA with a drug-eluting stent.  Asymptomatic.   History of VT ischemic cardiomyopathy s/p AICD -Orders placed to have AICD interrogated   Hyponatremia: Acute.  Resolved.   Chronic kidney disease stage IIIa: Slight improvement in creatinine but is still falls under CKD stage IIIa.  Will observe overnight and repeat labs in the morning while he is being transitioned on oral Lasix.   Hyperlipidemia -Continue atorvastatin   GERD -Continue Protonix   DVT prophylaxis:    Code Status: DNR  Family Communication:  None present at bedside.  Plan of care discussed with patient in length and he verbalized understanding and agreed with it.  Status is: Inpatient  Remains inpatient appropriate because: Needs further IV diuresis.  Estimated body mass index is 20.11 kg/m as calculated from the following:   Height as of this encounter: 5\' 11"  (1.803 m).   Weight as of this encounter: 65.4 kg.     Nutritional Assessment: Body mass index is 20.11 kg/m.Marland Kitchen Seen by dietician.  I agree with the assessment and plan as outlined below: Nutrition  Status:  Skin Assessment: I have examined the patient's skin and I agree with the wound assessment as performed by the wound care RN as outlined below:    Consultants:  Cardiology  Procedures:  None  Antimicrobials:  Anti-infectives (From admission, onward)    None          Subjective:  Patient seen and examined.  He denies any shortness of breath but I noticed that he was having some dyspnea while talking.  He tells [place] that his nephew died and all his family members are at the funeral today.  Objective: Vitals:   02/11/21 1628 02/11/21 2010 02/12/21 0433 02/12/21 1054  BP: 105/73 99/62 101/79 95/73  Pulse: 76 80 81 83  Resp: 16 18 17 18   Temp: 97.6 F (36.4 C) 97.7 F (36.5 C) 97.7 F (36.5 C) 98.1 F (36.7 C)  TempSrc: Oral Oral Oral Oral  SpO2: 98% 100% 96% 100%  Weight:   65.4 kg   Height:        Intake/Output Summary (Last 24 hours) at 02/12/2021 1151 Last data filed at 02/12/2021 0931 Gross per 24 hour  Intake 633 ml  Output 875 ml  Net -242 ml    Filed Weights   02/10/21 0300 02/11/21 0415 02/12/21 0433  Weight: 65.2 kg 66.9 kg 65.4 kg    Examination:  General exam: Appears calm and comfortable  Respiratory system: Faint bibasilar crackles. Respiratory effort normal. Cardiovascular system: S1 & S2 heard, RRR. No JVD, murmurs, rubs, gallops or clicks.  +3 pitting edema bilateral lower extremity and right upper extremity. Gastrointestinal system: Abdomen is nondistended, soft and nontender. No organomegaly or masses felt. Normal bowel sounds heard. Central nervous system: Alert and oriented. No focal neurological deficits. Extremities: Symmetric 5 x 5 power. Skin: No rashes, lesions or ulcers.  Psychiatry: Judgement and insight appear poor   Data Reviewed: I have personally reviewed following labs and imaging studies  CBC: Recent Labs  Lab 02/07/21 2227 02/09/21 0412  WBC 5.4 4.8  NEUTROABS  --  3.0  HGB 12.7* 11.8*  HCT 40.5 37.1*   MCV 95.7 95.1  PLT 127* 121*    Basic Metabolic Panel: Recent Labs  Lab 02/07/21 2227 02/07/21 2253 02/09/21 0412 02/10/21 0319 02/11/21 0257 02/12/21 0246  NA 134*  --  131* 134* 136 132*  K 5.1  --  3.3* 3.5 4.6 3.8  CL 103  --  97* 101 101 100  CO2 24  --  25 25 28 24   GLUCOSE 97  --  95 91 98 126*  BUN 26*  --  21 21 23  26*  CREATININE 1.37*  --  1.24 1.21 1.44* 1.32*  CALCIUM 8.3*  --  8.0* 8.3* 8.7* 8.6*  PHOS  --  3.3  --   --   --   --     GFR: Estimated Creatinine Clearance: 41.3 mL/min (A) (by C-G formula based on SCr of 1.32 mg/dL (H)). Liver Function Tests: No results for input(s): AST, ALT, ALKPHOS, BILITOT, PROT, ALBUMIN in the last 168 hours. No results for input(s): LIPASE,  AMYLASE in the last 168 hours. No results for input(s): AMMONIA in the last 168 hours. Coagulation Profile: Recent Labs  Lab 02/07/21 2227  INR 2.8*    Cardiac Enzymes: No results for input(s): CKTOTAL, CKMB, CKMBINDEX, TROPONINI in the last 168 hours. BNP (last 3 results) Recent Labs    01/27/21 1144  PROBNP 1,995.0*    HbA1C: No results for input(s): HGBA1C in the last 72 hours. CBG: No results for input(s): GLUCAP in the last 168 hours. Lipid Profile: No results for input(s): CHOL, HDL, LDLCALC, TRIG, CHOLHDL, LDLDIRECT in the last 72 hours. Thyroid Function Tests: No results for input(s): TSH, T4TOTAL, FREET4, T3FREE, THYROIDAB in the last 72 hours. Anemia Panel: No results for input(s): VITAMINB12, FOLATE, FERRITIN, TIBC, IRON, RETICCTPCT in the last 72 hours. Sepsis Labs: No results for input(s): PROCALCITON, LATICACIDVEN in the last 168 hours.  Recent Results (from the past 240 hour(s))  Resp Panel by RT-PCR (Flu A&B, Covid) Nasopharyngeal Swab     Status: None   Collection Time: 02/07/21 10:28 PM   Specimen: Nasopharyngeal Swab; Nasopharyngeal(NP) swabs in vial transport medium  Result Value Ref Range Status   SARS Coronavirus 2 by RT PCR NEGATIVE NEGATIVE  Final    Comment: (NOTE) SARS-CoV-2 target nucleic acids are NOT DETECTED.  The SARS-CoV-2 RNA is generally detectable in upper respiratory specimens during the acute phase of infection. The lowest concentration of SARS-CoV-2 viral copies this assay can detect is 138 copies/mL. A negative result does not preclude SARS-Cov-2 infection and should not be used as the sole basis for treatment or other patient management decisions. A negative result may occur with  improper specimen collection/handling, submission of specimen other than nasopharyngeal swab, presence of viral mutation(s) within the areas targeted by this assay, and inadequate number of viral copies(<138 copies/mL). A negative result must be combined with clinical observations, patient history, and epidemiological information. The expected result is Negative.  Fact Sheet for Patients:  EntrepreneurPulse.com.au  Fact Sheet for Healthcare Providers:  IncredibleEmployment.be  This test is no t yet approved or cleared by the [place] FDA and  has been authorized for detection and/or diagnosis of SARS-CoV-2 by FDA under an Emergency Use Authorization (EUA). This EUA will remain  in effect (meaning this test can be used) for the duration of the COVID-19 declaration under Section 564(b)(1) of the Act, 21 U.S.C.section 360bbb-3(b)(1), unless the authorization is terminated  or revoked sooner.       Influenza A by PCR NEGATIVE NEGATIVE Final   Influenza B by PCR NEGATIVE NEGATIVE Final    Comment: (NOTE) The Xpert Xpress SARS-CoV-2/FLU/RSV plus assay is intended as an aid in the diagnosis of influenza from Nasopharyngeal swab specimens and should not be used as a sole basis for treatment. Nasal washings and aspirates are unacceptable for Xpert Xpress SARS-CoV-2/FLU/RSV testing.  Fact Sheet for Patients: EntrepreneurPulse.com.au  Fact Sheet for Healthcare  Providers: IncredibleEmployment.be  This test is not yet approved or cleared by the [place] FDA and has been authorized for detection and/or diagnosis of SARS-CoV-2 by FDA under an Emergency Use Authorization (EUA). This EUA will remain in effect (meaning this test can be used) for the duration of the COVID-19 declaration under Section 564(b)(1) of the Act, 21 U.S.C. section 360bbb-3(b)(1), unless the authorization is terminated or revoked.  Performed at [facility], [place] [address] [place], [place] [address]        Radiology Studies: No results found.  Scheduled Meds:  atorvastatin  80  mg Oral QHS   carvedilol  3.125 mg Oral BID WC   divalproex  500 mg Oral QHS   ferrous sulfate  325 mg Oral Q breakfast   furosemide  40 mg Intravenous BID   pantoprazole  40 mg Oral Daily   rivaroxaban  15 mg Oral Q supper   senna-docusate  2 tablet Oral QHS   sodium chloride flush  3 mL Intravenous Q12H   traZODone  100 mg Oral QHS   Continuous Infusions:  sodium chloride       LOS: 4 days   Time spent: 30 minutes   Darliss Cheney, MD [facility]  02/12/2021, 11:51 AM  Please page via Shea Evans and do not message via secure chat for anything urgent. Secure chat can be used for anything non urgent.  How to contact the [facility] Attending or Consulting provider [place] or covering provider during after hours [place], for this patient?  Check the care team in [facility] and look for a) attending/consulting TRH provider listed and b) the [facility] team listed. Page or secure chat [phone]. Log into [email or web address] and use [place]'s universal password to access. If you do not have the password, please contact the hospital operator. Locate the [facility] provider you are looking for under [facility] and page to a number that you can be directly reached. If you still have difficulty reaching the provider, please page the [facility] (Director on Call) for the Hospitalists listed on  amion for assistance.

## 2021-02-12 NOTE — Progress Notes (Signed)
Physical Therapy Treatment Patient Details Name: Mark Clayton MRN: [id number] DOB: 1940/12/14 Today's Date: 02/12/2021   History of Present Illness Pt is an 80 y.o. male who presented 02/07/21 with lower extremity edema and swelling into his abdomen along with malaise, cough, orthopnea, and wheezing. Chest x-ray noted cardiomegaly with small bilateral pleural effusions. Pt admitted with acute on chronic systolic congestive heart failure. PMH: HTN, HLD, PAF on chronic anticoagulation, s/p AICD, LV thrombus, ICM, CAD s/p CABG, CVA, colon cancer s/p sigmoid colon resection    PT Comments    Upon initial attempt for PT session, pt was resistive due to not being motivated due to his "nephew was murdered" and the funeral was today, per pt. However, upon return pt was agreeable to ambulate to the door and back to ensure pt was capable of mobilizing at a level that could be managed for d/c home. Pt displays lower extremity weakness through difficulty transferring to stand this date, requiring modA the first rep. However, with subsequent reps and further mobility pt progressed to transferring with minA the second rep and progressed from requiring minA to ambulate initially to only needing min guard assist by the end. Pt is at risk for falls, requiring hands on assistance for all standing mobility at this time. Current recommendations remain appropriate provided he has the level of care necessary. Will continue to follow acutely.    Recommendations for follow up therapy are one component of a multi-disciplinary discharge planning process, led by the attending physician.  Recommendations may be updated based on patient status, additional functional criteria and insurance authorization.  Follow Up Recommendations  Home health PT     Assistance Recommended at Discharge Frequent or constant Supervision/Assistance  Equipment Recommendations  None recommended by PT    Recommendations for Other Services        Precautions / Restrictions Precautions Precautions: Fall Restrictions Weight Bearing Restrictions: No     Mobility  Bed Mobility Overal bed mobility: Needs Assistance Bed Mobility: Sit to Supine       Sit to supine: Mod assist   General bed mobility comments: ModA to manage legs onto bed with transition sit > supine.    Transfers Overall transfer level: Needs assistance Equipment used: Rolling walker (2 wheels) Transfers: Sit to/from Stand Sit to Stand: Min assist;Mod assist           General transfer comment: Pt with difficulty extending knees and hips to come to stand from EOB first rep, requiring modA to complete. Pt with better power up, only needing minA, on 2nd rep. Cues for hand placement.    Ambulation/Gait Ambulation/Gait assistance: Min guard;Min assist Gait Distance (Feet): 25 Feet Assistive device: Rolling walker (2 wheels) Gait Pattern/deviations: Step-through pattern;Decreased stride length;Trunk flexed;Decreased step length - right;Decreased dorsiflexion - right Gait velocity: reduced Gait velocity interpretation: <1.31 ft/sec, indicative of household ambulator   General Gait Details: Pt with slow gait, dragging R foot with report of hx of weakness from prior CVA, no R foot clearance off ground even with cues. Pt required minA for stability initially, but progressed to min guard assist as distance progressed. Pt with several standing rest breaks due to fatigue.   Stairs             Wheelchair Mobility    Modified Rankin (Stroke Patients Only)       Balance Overall balance assessment: Needs assistance Sitting-balance support: No upper extremity supported;Feet supported Sitting balance-Leahy Scale: Fair     Standing balance support:  Reliant on assistive device for balance Standing balance-Leahy Scale: Poor Standing balance comment: Reliant on RW                            Cognition Arousal/Alertness:  Awake/alert Behavior During Therapy: WFL for tasks assessed/performed Overall Cognitive Status: No family/caregiver present to determine baseline cognitive functioning                                 General Comments: Pt with STM deficits, appearing to have forgotten PT had been in room asking about participating in PT about an hour earlier.        Exercises      General Comments General comments (skin integrity, edema, etc.): Educated pt to have someone assist him with any standing mobility and to not get up alone at home.      Pertinent Vitals/Pain Pain Assessment: Faces Faces Pain Scale: No hurt Pain Intervention(s): Monitored during session    Home Living                          Prior Function            PT Goals (current goals can now be found in the care plan section) Acute Rehab PT Goals Patient Stated Goal: to go home tomorrow PT Goal Formulation: With patient Time For Goal Achievement: 02/23/21 Potential to Achieve Goals: Fair Progress towards PT goals: Progressing toward goals    Frequency    Min 3X/week      PT Plan Current plan remains appropriate    Co-evaluation              AM-PAC PT "6 Clicks" Mobility   Outcome Measure  Help needed turning from your back to your side while in a flat bed without using bedrails?: A Little Help needed moving from lying on your back to sitting on the side of a flat bed without using bedrails?: A Lot Help needed moving to and from a bed to a chair (including a wheelchair)?: A Lot Help needed standing up from a chair using your arms (e.g., wheelchair or bedside chair)?: A Lot Help needed to walk in hospital room?: A Little Help needed climbing 3-5 steps with a railing? : A Lot 6 Click Score: 14    End of Session Equipment Utilized During Treatment: Gait belt Activity Tolerance: Patient tolerated treatment well Patient left: in bed;with call bell/phone within reach;with bed alarm  set Nurse Communication: Mobility status PT Visit Diagnosis: Unsteadiness on feet (R26.81);Other abnormalities of gait and mobility (R26.89);Muscle weakness (generalized) (M62.81);Difficulty in walking, not elsewhere classified (R26.2)     Time: 4174-0814 PT Time Calculation (min) (ACUTE ONLY): 27 min  Charges:  $Gait Training: 8-22 mins $Therapeutic Activity: 8-22 mins                     Moishe Spice, PT, DPT Acute Rehabilitation Services  Pager: [phone] Office: [place] 02/12/2021, 4:52 PM

## 2021-02-12 NOTE — TOC Progression Note (Addendum)
Transition of Care [facility]) - Progression Note    Patient Details  Name: Mark Clayton MRN: [id number] Date of Birth: Oct 15, 1940  Transition of Care [facility]) CM/SW Contact  Mark Mayo, RN Phone Number: 02/12/2021, 10:18 AM  Clinical Narrative:    NCM spoke with patient, he gave me verbal permission to speak with daughter regarding dc plans for outpatient pallative  services.  NCM called Mark Clayton , offered choice for outpatient palliative services. She states she will have to speak with her sister and call this NCM back to give the choice for outpatient palliative services. NCM received call back from daughter Mark Clayton, she states they do not want to go forward with the outpatient palliative services at this time. Mark Clayton states they would like to have a 24 hr notice before he is discharged because they have to notify his caregiver and they will have to arrange transport.   Expected Discharge Plan: [place] Barriers to Discharge: Continued Medical Work up  Expected Discharge Plan and Services Expected Discharge Plan: [place] In-house Referral: NA Discharge Planning Services: CM Consult Post Acute Care Choice: Home Health, Resumption of Svcs/PTA Provider Living arrangements for the past 2 months: Single Family Home                   DME Agency: NA       HH Arranged: RN, Disease Management, PT HH Agency: [place] Date HH Agency Contacted: 02/09/21 Time [place]: 1536 Representative spoke with at [place]: [place]   Social Determinants of Health (SDOH) Interventions Financial Strain Interventions: Other (Comment) (HF pharmacy to see) Housing Interventions: Intervention Not Indicated Transportation Interventions: Intervention Not Indicated  Readmission Risk Interventions No flowsheet data found.

## 2021-02-12 NOTE — Progress Notes (Addendum)
Progress Note  Patient Name: Mark Clayton Date of Encounter: 02/12/2021  Primary Cardiologist: Candee Furbish, MD   Subjective   Feeling down today because his nephew was killed, on Monday he believes, and his family is having the funeral for him today.   Inpatient Medications    Scheduled Meds:  atorvastatin  80 mg Oral QHS   carvedilol  3.125 mg Oral BID WC   divalproex  500 mg Oral QHS   ferrous sulfate  325 mg Oral Q breakfast   furosemide  40 mg Oral BID   pantoprazole  40 mg Oral Daily   rivaroxaban  15 mg Oral Q supper   senna-docusate  2 tablet Oral QHS   sodium chloride flush  3 mL Intravenous Q12H   traZODone  100 mg Oral QHS   Continuous Infusions:  sodium chloride     PRN Meds: sodium chloride, acetaminophen, sodium chloride flush   Vital Signs    Vitals:   02/11/21 1201 02/11/21 1628 02/11/21 2010 02/12/21 0433  BP: 94/68 105/73 99/62 101/79  Pulse: 82 76 80 81  Resp: 16 16 18 17   Temp: 97.6 F (36.4 C) 97.6 F (36.4 C) 97.7 F (36.5 C) 97.7 F (36.5 C)  TempSrc: Oral Oral Oral Oral  SpO2: 99% 98% 100% 96%  Weight:    65.4 kg  Height:        Intake/Output Summary (Last 24 hours) at 02/12/2021 1018 Last data filed at 02/12/2021 0931 Gross per 24 hour  Intake 633 ml  Output 875 ml  Net -242 ml   Filed Weights   02/10/21 0300 02/11/21 0415 02/12/21 0433  Weight: 65.2 kg 66.9 kg 65.4 kg    Telemetry    Afib, NSVT. - Personally Reviewed  ECG    Afib LBBB - Personally Reviewed  Physical Exam   GEN: No acute distress.   Neck: No JVD Cardiac: RRR, no murmurs, rubs, or gallops.  Respiratory: bilateral end expiratory wheeze. GI: Soft, nontender, non-distended  MS: bilateral edema; No deformity. Neuro:  Nonfocal  Psych: Normal affect    Labs    Chemistry Recent Labs  Lab 02/10/21 0319 02/11/21 0257 02/12/21 0246  NA 134* 136 132*  K 3.5 4.6 3.8  CL 101 101 100  CO2 25 28 24   GLUCOSE 91 98 126*  BUN 21 23 26*   CREATININE 1.21 1.44* 1.32*  CALCIUM 8.3* 8.7* 8.6*  GFRNONAA >60 49* 55*  ANIONGAP 8 7 8      Hematology Recent Labs  Lab 02/07/21 2227 02/09/21 0412  WBC 5.4 4.8  RBC 4.23 3.90*  HGB 12.7* 11.8*  HCT 40.5 37.1*  MCV 95.7 95.1  MCH 30.0 30.3  MCHC 31.4 31.8  RDW 20.5* 19.9*  PLT 127* 121*    Cardiac EnzymesNo results for input(s): TROPONINI in the last 168 hours. No results for input(s): TROPIPOC in the last 168 hours.   BNP Recent Labs  Lab 02/07/21 2254  BNP 2,099.8*     DDimer No results for input(s): DDIMER in the last 168 hours.   Radiology    No results found.  Cardiac Studies   As above  Patient Profile     80 y.o. male with a hx of (HFrEF) heart failure with reduced ejection fraction due to ischemic CM with EF < 20, coronary artery disease s/p CABG in 2003, DES to the native RCA and DES to S-RPDA (jump graft to RPL chronically occluded), permanent atrial fibrillation on chronic anticoagulation, VT,  s/p ICD, prior LV mural thrombus resolved on f/u studies, chronic kidney disease, DNR, hypertension, hyperlipidemia here for congestive heart failure.  Assessment & Plan   Principal Problem:   Acute on chronic systolic CHF (congestive heart failure) (HCC) Active Problems:   Hyperlipidemia   Cardiac defibrillator  MDT VVI   Persistent atrial fibrillation   Coronary artery disease involving native heart with angina pectoris (HCC)   Essential hypertension   Chronic anticoagulation   CHF (congestive heart failure) (HCC)   Acute systolic heart failure - Cachectic ill-appearing.   - Transitioned to lasix 40 mg po BID, however has wheezing today, may be cardiac in origin with reduced urine output. Will give lasix 80 mg IV BID today, if no significant diuresis can increase bolus dose tomorrow vs continuous infusion. Consider REDSCLIP evaluation after aggressive diuresis.  -Unable to utilize traditional goal-directed medical therapy due to frailty and  hypotension, however continue BB, will consider SGLT2I as outpatient if affordable for patient.    CAD - CABG 2003 stent to RCA in 2016 and stent to SVG vein graft in 2020.  No anginal symptoms.  No aspirin because of Xarelto. Continue statin and BB.   Permanent atrial fibrillation - Currently well rate controlled. - on Xarelto 15 mg for chronic anticoagulation based on GFR less than 50.   DNR - Agree.  Palliative care team has been consulted, patient not ready for hospice, hopes to go home with [facility] and palliative referral. Not ready to go home today with his whole family at a funeral. We will work towards aggressively getting him to dry weight.     For questions or updates, please contact [place] Please consult [email or web address] for contact info under        Signed, Elouise Munroe, MD  02/12/2021, 10:18 AM

## 2021-02-12 NOTE — Progress Notes (Signed)
Patient BP low 98/69, Card NP Mickel Baas notified. NP okay to hold PM dose lasix. Report given to Night RN. Will continue to monitor the patient.

## 2021-02-12 NOTE — Progress Notes (Signed)
   Called due to BP 98 systolic with increase of lasix to 80 BID.  He is neg 4, 893 since admit and today I&O not complete he is incontinent at times.  Will hold pm lasix and if he becomes SOB overnight will given another dose.   Cecilie Kicks, FNP-C At [place]  [phone] or after 5pm and on weekends call [phone] 02/12/2021.

## 2021-02-12 NOTE — Care Management Important Message (Signed)
Important Message  Patient Details  Name: Mark Clayton MRN: [id number] Date of Birth: [birth year]   Medicare Important Message Given:  Yes     [name] 02/12/2021, 9:30 AM

## 2021-02-13 DIAGNOSIS — I5023 Acute on chronic systolic (congestive) heart failure: Secondary | ICD-10-CM | POA: Diagnosis not present

## 2021-02-13 LAB — BASIC METABOLIC PANEL
Anion gap: 8 (ref 5–15)
BUN: 26 mg/dL — ABNORMAL HIGH (ref 8–23)
CO2: 28 mmol/L (ref 22–32)
Calcium: 8.7 mg/dL — ABNORMAL LOW (ref 8.9–10.3)
Chloride: 100 mmol/L (ref 98–111)
Creatinine, Ser: 1.37 mg/dL — ABNORMAL HIGH (ref 0.61–1.24)
GFR, Estimated: 52 mL/min — ABNORMAL LOW (ref >60)
Glucose, Bld: 87 mg/dL (ref 70–99)
Potassium: 4 mmol/L (ref 3.5–5.1)
Sodium: 136 mmol/L (ref 135–145)

## 2021-02-13 MED ORDER — FUROSEMIDE 10 MG/ML IJ SOLN: 80.0000 mg | Freq: Once | INTRAMUSCULAR | Status: AC

## 2021-02-13 MED ORDER — DEXTROSE 5 % IV SOLN
10.0000 mg/h | INTRAVENOUS | Status: AC
Start: 2021-02-13 — End: 2021-02-15
  Administered 2021-02-13 – 2021-02-15 (×4): 10 mg/h via INTRAVENOUS
  Filled 2021-02-13 (×3): qty 20

## 2021-02-13 NOTE — Progress Notes (Signed)
PROGRESS NOTE    Mark Clayton  NUU:[id number] DOB: 08-13-1940 DOA: 02/07/2021 PCP: Mark Koch, MD   Brief Narrative:  Mark Clayton is a 80 y.o. male with medical history significant of HTN, HLD, PAF on chronic anticoagulation, s/p AICD, LV thrombus, ICM, CAD s/p CABG, CVA, colon cancer s/p sigmoid colon resection who presented with complaints of progressively worsening swelling over the last 2 -3 weeks.  Normally patient ambulates with the use of a rolling walker.  He had been having difficulty getting around at home due to symptoms.  Noted swelling all the way into his abdomen which was causing him discomfort.  Patient was seen last by his primary care provider on 10/31 due to symptoms.  He had increased his dose of Lasix from 40 to 60 mg daily for at least the last 2 weeks, but had not noticed any significant change.  Associated symptoms included orthopnea, wheezing(reported by daughter), nonproductive cough, and malaise.   BNP 2099.8.  Chest x-ray noted cardiomegaly with small bilateral pleural effusions.  Influenza and COVID-19 screening were negative.  Patient was given 60 mg of Lasix IV x1 dose.  Admitted under [place].    Assessment & Plan:   Principal Problem:   Acute on chronic systolic CHF (congestive heart failure) (HCC) Active Problems:   Hyperlipidemia   Cardiac defibrillator  MDT VVI   Persistent atrial fibrillation   Coronary artery disease involving native heart with angina pectoris [facility])   Essential hypertension   Chronic anticoagulation   CHF (congestive heart failure) (HCC)  Acute on chronic combined systolic and diastolic congestive heart failure: BNP of 2099.8.  Chest x-ray noted cardiomegaly with small bilateral pleural effusions.  Last echocardiogram noted EF of less than 20% with global hypokinesis in 05/2019 and grade 2 diastolic dysfunction. He was initially given 60 mg Lasix IV in the ED with output of at least 1 L and improvement in symptoms.  He still  is significantly fluid overloaded with +3 pitting edema bilateral lower extremity and +4 pitting edema right upper extremity.  Has had only about 400 cc output since yesterday despite of receiving Lasix 80 mg yesterday morning, evening dose was held due to blood pressure being low.  Awaiting cardiology to make further decisions, may very well need Lasix infusion.    Permanent atrial fibrillation on chronic anticoagulation: Rate controlled.  Continue Xarelto and Coreg   Essential hypertension: Blood pressure soft.  Continue home Coreg 3.125 mg p.o. twice daily.    CAD s/p CABG: Last cardiac cath from 11/22/2018 noted multivessel coronary artery disease with successful PCI to SVG to RPDA with a drug-eluting stent.  Asymptomatic.   History of VT ischemic cardiomyopathy s/p AICD -Orders placed to have AICD interrogated   Hyponatremia: Acute.  Resolved.   Chronic kidney disease stage IIIa: Creatinine has been stable around 1.3 since last 3 days.  Monitor closely.   Hyperlipidemia -Continue atorvastatin   GERD -Continue Protonix   DVT prophylaxis:    Code Status: DNR  Family Communication:  None present at bedside.  Plan of care discussed with patient in length and he verbalized understanding and agreed with it.  Status is: Inpatient  Remains inpatient appropriate because: Needs further IV diuresis.  Estimated body mass index is 20.54 kg/m as calculated from the following:   Height as of this encounter: 5\' 11"  (1.803 m).   Weight as of this encounter: 66.8 kg.     Nutritional Assessment: Body mass index is 20.54 kg/m.Marland Kitchen Seen  by dietician.  I agree with the assessment and plan as outlined below: Nutrition Status:  Skin Assessment: I have examined the patient's skin and I agree with the wound assessment as performed by the wound care RN as outlined below:    Consultants:  Cardiology  Procedures:  None  Antimicrobials:  Anti-infectives (From admission, onward)    None           Subjective:  Patient seen and examined.  Looks better than yesterday.  No shortness of breath.  He is fine with staying in the hospital as long as it is needed.  Objective: Vitals:   02/12/21 1629 02/12/21 1845 02/12/21 2046 02/13/21 0526  BP: 106/73 98/69 117/68 112/69  Pulse: 71 78 84 80  Resp:      Temp:   98 F (36.7 C) 97.7 F (36.5 C)  TempSrc:   Oral   SpO2:   98% 99%  Weight:    66.8 kg  Height:        Intake/Output Summary (Last 24 hours) at 02/13/2021 0836 Last data filed at 02/12/2021 1900 Gross per 24 hour  Intake 120 ml  Output 375 ml  Net -255 ml    Filed Weights   02/11/21 0415 02/12/21 0433 02/13/21 0526  Weight: 66.9 kg 65.4 kg 66.8 kg    Examination:  General exam: Appears calm and comfortable  Respiratory system: Clear to auscultation. Respiratory effort normal. Cardiovascular system: S1 & S2 heard, RRR. No JVD, murmurs, rubs, gallops or clicks.  +3 pitting edema bilateral lower extremity. Gastrointestinal system: Abdomen is nondistended, soft and nontender. No organomegaly or masses felt. Normal bowel sounds heard. Central nervous system: Alert and oriented. No focal neurological deficits. Extremities: Symmetric 5 x 5 power. Skin: No rashes, lesions or ulcers.  Psychiatry: Judgement and insight appear poor  Data Reviewed: I have personally reviewed following labs and imaging studies  CBC: Recent Labs  Lab 02/07/21 2227 02/09/21 0412  WBC 5.4 4.8  NEUTROABS  --  3.0  HGB 12.7* 11.8*  HCT 40.5 37.1*  MCV 95.7 95.1  PLT 127* 121*    Basic Metabolic Panel: Recent Labs  Lab 02/07/21 2253 02/09/21 0412 02/10/21 0319 02/11/21 0257 02/12/21 0246 02/13/21 0151  NA  --  131* 134* 136 132* 136  K  --  3.3* 3.5 4.6 3.8 4.0  CL  --  97* 101 101 100 100  CO2  --  25 25 28 24 28   GLUCOSE  --  95 91 98 126* 87  BUN  --  21 21 23  26* 26*  CREATININE  --  1.24 1.21 1.44* 1.32* 1.37*  CALCIUM  --  8.0* 8.3* 8.7* 8.6* 8.7*   PHOS 3.3  --   --   --   --   --     GFR: Estimated Creatinine Clearance: 40.6 mL/min (A) (by C-G formula based on SCr of 1.37 mg/dL (H)). Liver Function Tests: No results for input(s): AST, ALT, ALKPHOS, BILITOT, PROT, ALBUMIN in the last 168 hours. No results for input(s): LIPASE, AMYLASE in the last 168 hours. No results for input(s): AMMONIA in the last 168 hours. Coagulation Profile: Recent Labs  Lab 02/07/21 2227  INR 2.8*    Cardiac Enzymes: No results for input(s): CKTOTAL, CKMB, CKMBINDEX, TROPONINI in the last 168 hours. BNP (last 3 results) Recent Labs    01/27/21 1144  PROBNP 1,995.0*    HbA1C: No results for input(s): HGBA1C in the last 72 hours. CBG: No results  for input(s): GLUCAP in the last 168 hours. Lipid Profile: No results for input(s): CHOL, HDL, LDLCALC, TRIG, CHOLHDL, LDLDIRECT in the last 72 hours. Thyroid Function Tests: No results for input(s): TSH, T4TOTAL, FREET4, T3FREE, THYROIDAB in the last 72 hours. Anemia Panel: No results for input(s): VITAMINB12, FOLATE, FERRITIN, TIBC, IRON, RETICCTPCT in the last 72 hours. Sepsis Labs: No results for input(s): PROCALCITON, LATICACIDVEN in the last 168 hours.  Recent Results (from the past 240 hour(s))  Resp Panel by RT-PCR (Flu A&B, Covid) Nasopharyngeal Swab     Status: None   Collection Time: 02/07/21 10:28 PM   Specimen: Nasopharyngeal Swab; Nasopharyngeal(NP) swabs in vial transport medium  Result Value Ref Range Status   SARS Coronavirus 2 by RT PCR NEGATIVE NEGATIVE Final    Comment: (NOTE) SARS-CoV-2 target nucleic acids are NOT DETECTED.  The SARS-CoV-2 RNA is generally detectable in upper respiratory specimens during the acute phase of infection. The lowest concentration of SARS-CoV-2 viral copies this assay can detect is 138 copies/mL. A negative result does not preclude SARS-Cov-2 infection and should not be used as the sole basis for treatment or other patient management  decisions. A negative result may occur with  improper specimen collection/handling, submission of specimen other than nasopharyngeal swab, presence of viral mutation(s) within the areas targeted by this assay, and inadequate number of viral copies(<138 copies/mL). A negative result must be combined with clinical observations, patient history, and epidemiological information. The expected result is Negative.  Fact Sheet for Patients:  EntrepreneurPulse.com.au  Fact Sheet for Healthcare Providers:  IncredibleEmployment.be  This test is no t yet approved or cleared by the [place] FDA and  has been authorized for detection and/or diagnosis of SARS-CoV-2 by FDA under an Emergency Use Authorization (EUA). This EUA will remain  in effect (meaning this test can be used) for the duration of the COVID-19 declaration under Section 564(b)(1) of the Act, 21 U.S.C.section 360bbb-3(b)(1), unless the authorization is terminated  or revoked sooner.       Influenza A by PCR NEGATIVE NEGATIVE Final   Influenza B by PCR NEGATIVE NEGATIVE Final    Comment: (NOTE) The Xpert Xpress SARS-CoV-2/FLU/RSV plus assay is intended as an aid in the diagnosis of influenza from Nasopharyngeal swab specimens and should not be used as a sole basis for treatment. Nasal washings and aspirates are unacceptable for Xpert Xpress SARS-CoV-2/FLU/RSV testing.  Fact Sheet for Patients: EntrepreneurPulse.com.au  Fact Sheet for Healthcare Providers: IncredibleEmployment.be  This test is not yet approved or cleared by the [place] FDA and has been authorized for detection and/or diagnosis of SARS-CoV-2 by FDA under an Emergency Use Authorization (EUA). This EUA will remain in effect (meaning this test can be used) for the duration of the COVID-19 declaration under Section 564(b)(1) of the Act, 21 U.S.C. section 360bbb-3(b)(1), unless the  authorization is terminated or revoked.  Performed at [facility], [place] [address], [place], [place] [address]        Radiology Studies: No results found.  Scheduled Meds:  atorvastatin  80 mg Oral QHS   carvedilol  3.125 mg Oral BID WC   divalproex  500 mg Oral QHS   ferrous sulfate  325 mg Oral Q breakfast   furosemide  80 mg Intravenous BID   pantoprazole  40 mg Oral Daily   rivaroxaban  15 mg Oral Q supper   senna-docusate  2 tablet Oral QHS   sodium chloride flush  3 mL Intravenous Q12H   traZODone  100 mg Oral QHS   Continuous Infusions:  sodium chloride       LOS: 5 days   Time spent: 28 minutes   Darliss Cheney, MD [facility]  02/13/2021, 8:36 AM  Please page via Amion and do not message via secure chat for anything urgent. Secure chat can be used for anything non urgent.  How to contact the [facility] Attending or Consulting provider [place] or covering provider during after hours [place], for this patient?  Check the care team in [facility] and look for a) attending/consulting [facility] provider listed and b) the [facility] team listed. Page or secure chat [phone]. Log into [email or web address] and use [facility] universal password to access. If you do not have the password, please contact the hospital operator. Locate the [facility] provider you are looking for under [facility] and page to a number that you can be directly reached. If you still have difficulty reaching the provider, please page the [facility] (Director on Call) for the Hospitalists listed on amion for assistance.

## 2021-02-13 NOTE — Progress Notes (Signed)
PT Cancellation Note  Patient Details Name: Mark Clayton MRN: [id number] DOB: 1940/11/08   Cancelled Treatment:    Reason Eval/Treat Not Completed: Fatigue/lethargy limiting ability to participate. Attempted 3x this afternoon, 2x pt was asleep and on 3rd attempt pt reported he was too lethargic, upset about his nephew passing, and that he just wanted to sleep and had not slept much yet. Pt kindly asking PT to return tomorrow. Will attempt again tomorrow per pt request.   Moishe Spice, PT, DPT Acute Rehabilitation Services  Pager: [phone] Office: [place] 02/13/2021, 4:46 PM

## 2021-02-13 NOTE — Progress Notes (Signed)
Progress Note  Patient Name: Mark Clayton Date of Encounter: 02/13/2021  Primary Cardiologist: Candee Furbish, MD   Subjective   Still thinking of his nephew who passed earlier this week, otherwise he's feeling ok.  Inpatient Medications    Scheduled Meds:  atorvastatin  80 mg Oral QHS   carvedilol  3.125 mg Oral BID WC   divalproex  500 mg Oral QHS   ferrous sulfate  325 mg Oral Q breakfast   furosemide  80 mg Intravenous Once   pantoprazole  40 mg Oral Daily   rivaroxaban  15 mg Oral Q supper   senna-docusate  2 tablet Oral QHS   sodium chloride flush  3 mL Intravenous Q12H   traZODone  100 mg Oral QHS   Continuous Infusions:  sodium chloride     furosemide (LASIX) 200 mg in dextrose 5% 100 mL (2mg /mL) infusion     PRN Meds: sodium chloride, acetaminophen, sodium chloride flush   Vital Signs    Vitals:   02/12/21 1845 02/12/21 2046 02/13/21 0526 02/13/21 0841  BP: 98/69 117/68 112/69 104/64  Pulse: 78 84 80 84  Resp:      Temp:  98 F (36.7 C) 97.7 F (36.5 C)   TempSrc:  Oral    SpO2:  98% 99%   Weight:   66.8 kg   Height:        Intake/Output Summary (Last 24 hours) at 02/13/2021 0900 Last data filed at 02/12/2021 1900 Gross per 24 hour  Intake 120 ml  Output 375 ml  Net -255 ml   Filed Weights   02/11/21 0415 02/12/21 0433 02/13/21 0526  Weight: 66.9 kg 65.4 kg 66.8 kg    Telemetry    Afib, NSVT, 3-4 beat runs - Personally Reviewed  ECG    Afib LBBB - Personally Reviewed  Physical Exam   GEN: No acute distress.   Neck: No JVD Cardiac: iRRR, no murmurs, rubs, or gallops.  Respiratory: Clear to auscultation bilaterally. GI: Soft, nontender, non-distended  MS: 1+ left ankle edema; No deformity. Neuro:  Nonfocal  Psych: Normal affect   Labs    Chemistry Recent Labs  Lab 02/11/21 0257 02/12/21 0246 02/13/21 0151  NA 136 132* 136  K 4.6 3.8 4.0  CL 101 100 100  CO2 28 24 28   GLUCOSE 98 126* 87  BUN 23 26* 26*  CREATININE  1.44* 1.32* 1.37*  CALCIUM 8.7* 8.6* 8.7*  GFRNONAA 49* 55* 52*  ANIONGAP 7 8 8      Hematology Recent Labs  Lab 02/07/21 2227 02/09/21 0412  WBC 5.4 4.8  RBC 4.23 3.90*  HGB 12.7* 11.8*  HCT 40.5 37.1*  MCV 95.7 95.1  MCH 30.0 30.3  MCHC 31.4 31.8  RDW 20.5* 19.9*  PLT 127* 121*    Cardiac EnzymesNo results for input(s): TROPONINI in the last 168 hours. No results for input(s): TROPIPOC in the last 168 hours.   BNP Recent Labs  Lab 02/07/21 2254  BNP 2,099.8*     DDimer No results for input(s): DDIMER in the last 168 hours.   Radiology    No results found.  Cardiac Studies   As above  Patient Profile     80 y.o. male with a hx of (HFrEF) heart failure with reduced ejection fraction due to ischemic CM with EF < 20, coronary artery disease s/p CABG in 2003, DES to the native RCA and DES to S-RPDA (jump graft to RPL chronically occluded), permanent atrial fibrillation  on chronic anticoagulation, VT, s/p ICD, prior LV mural thrombus resolved on f/u studies, chronic kidney disease, DNR, hypertension, hyperlipidemia here for congestive heart failure.  Assessment & Plan   Principal Problem:   Acute on chronic systolic CHF (congestive heart failure) (HCC) Active Problems:   Hyperlipidemia   Cardiac defibrillator  MDT VVI   Persistent atrial fibrillation   Coronary artery disease involving native heart with angina pectoris (HCC)   Essential hypertension   Chronic anticoagulation   CHF (congestive heart failure) (HCC)   Acute systolic heart failure - Cachectic ill-appearing.   - evening lasix held for hypotension. If we want to aggressively trial diuresis, may need infusion just to avoid held doses. No other advantage over bolus dosing. Will give lasix 80mg  IV bolus now and start drip at 10 mg/hr. I/O challenging because of incontinence and unclear urine output. - Consider REDSCLIP evaluation after aggressive diuresis.  -Unable to utilize traditional goal-directed  medical therapy due to frailty and hypotension, however continue BB, will consider SGLT2I as outpatient if affordable for patient.    CAD - CABG 2003 stent to RCA in 2016 and stent to SVG vein graft in 2020.  No anginal symptoms.  No aspirin because of Xarelto. Continue statin and BB.   Permanent atrial fibrillation - Currently well rate controlled. - on Xarelto 15 mg for chronic anticoagulation based on GFR less than 50.   DNR - Strongly recommend continued palliative care discussions, may be best to have when family present.     For questions or updates, please contact [place] Please consult [email or web address] for contact info under        Signed, Elouise Munroe, MD  02/13/2021, 9:00 AM

## 2021-02-14 DIAGNOSIS — I5023 Acute on chronic systolic (congestive) heart failure: Secondary | ICD-10-CM | POA: Diagnosis not present

## 2021-02-14 DIAGNOSIS — E785 Hyperlipidemia, unspecified: Secondary | ICD-10-CM | POA: Diagnosis not present

## 2021-02-14 DIAGNOSIS — I10 Essential (primary) hypertension: Secondary | ICD-10-CM | POA: Diagnosis not present

## 2021-02-14 DIAGNOSIS — Z7901 Long term (current) use of anticoagulants: Secondary | ICD-10-CM | POA: Diagnosis not present

## 2021-02-14 LAB — BASIC METABOLIC PANEL
Anion gap: 9 (ref 5–15)
BUN: 22 mg/dL (ref 8–23)
CO2: 26 mmol/L (ref 22–32)
Calcium: 8.4 mg/dL — ABNORMAL LOW (ref 8.9–10.3)
Chloride: 99 mmol/L (ref 98–111)
Creatinine, Ser: 1.3 mg/dL — ABNORMAL HIGH (ref 0.61–1.24)
GFR, Estimated: 56 mL/min — ABNORMAL LOW (ref >60)
Glucose, Bld: 99 mg/dL (ref 70–99)
Potassium: 3.3 mmol/L — ABNORMAL LOW (ref 3.5–5.1)
Sodium: 134 mmol/L — ABNORMAL LOW (ref 135–145)

## 2021-02-14 MED ORDER — POTASSIUM CHLORIDE CRYS ER 20 MEQ PO TBCR
40.0000 meq | EXTENDED_RELEASE_TABLET | Freq: Two times a day (BID) | ORAL | Status: DC
  Administered 2021-02-14 (×2): 40 meq via ORAL
  Filled 2021-02-14 (×3): qty 2

## 2021-02-14 NOTE — Progress Notes (Signed)
PROGRESS NOTE    Mark Clayton  YNW:[id number] DOB: July 01, 1940 DOA: 02/07/2021 PCP: [name], MD   Brief Narrative:  Mark Clayton is a 80 y.o. male with medical history significant of HTN, HLD, PAF on chronic anticoagulation, s/p AICD, LV thrombus, ICM, CAD s/p CABG, CVA, colon cancer s/p sigmoid colon resection who presented with complaints of progressively worsening swelling over the last 2 -3 weeks.  Normally patient ambulates with the use of a rolling walker.  He had been having difficulty getting around at home due to symptoms.  Noted swelling all the way into his abdomen which was causing him discomfort.  Patient was seen last by his primary care provider on 10/31 due to symptoms.  He had increased his dose of Lasix from 40 to 60 mg daily for at least the last 2 weeks, but had not noticed any significant change.  Associated symptoms included orthopnea, wheezing(reported by daughter), nonproductive cough, and malaise.   BNP 2099.8.  Chest x-ray noted cardiomegaly with small bilateral pleural effusions.  Influenza and COVID-19 screening were negative.  Patient was given 60 mg of Lasix IV x1 dose.  Admitted under [place].    Assessment & Plan:   Principal Problem:   Acute on chronic systolic CHF (congestive heart failure) (HCC) Active Problems:   Hyperlipidemia   Cardiac defibrillator  MDT VVI   Persistent atrial fibrillation   Coronary artery disease involving native heart with angina pectoris [facility])   Essential hypertension   Chronic anticoagulation   CHF (congestive heart failure) (HCC)  Acute on chronic combined systolic and diastolic congestive heart failure: BNP of 2099.8.  Chest x-ray noted cardiomegaly with small bilateral pleural effusions.  Last echocardiogram noted EF of less than 20% with global hypokinesis in 05/2019 and grade 2 diastolic dysfunction. He was initially given 60 mg Lasix IV in the ED with output of at least 1 L and improvement in symptoms.  He was  then continued on IV Lasix 40 mg twice daily, then was transitioned to oral Lasix.  Still had significant fluid overload so he was started on Lasix infusion yesterday.  He has had robust urine output with 4.7 L out and he is net -9.2 L now.  Significant weight discrepancy, charted 66.8 kg yesterday and 50 8.9K yesterday.  Edema is improving.  I think he will need and benefit from at least 1-2 more days of IV Lasix infusion.  Cardiology managing.  Permanent atrial fibrillation on chronic anticoagulation: Rate controlled.  Continue Xarelto and Coreg   Essential hypertension: Blood pressure soft intermittently.  Continue home Coreg 3.125 mg p.o. twice daily.    CAD s/p CABG: Last cardiac cath from 11/22/2018 noted multivessel coronary artery disease with successful PCI to SVG to RPDA with a drug-eluting stent.  Asymptomatic.   History of VT ischemic cardiomyopathy s/p AICD -Orders placed to have AICD interrogated  Hypokalemia: 3.3.  Now that he is on Lasix infusion, he will be at risk of hypokalemia.  I will start him on potassium chloride 40 mEq twice daily.   Hyponatremia: Acute.  Resolved.   Chronic kidney disease stage IIIa: Creatinine has been stable around 1.3 since last 3 days.  Monitor closely.   Hyperlipidemia -Continue atorvastatin   GERD -Continue Protonix   DVT prophylaxis:    Code Status: DNR  Family Communication:  None present at bedside.  Discussed with patient. Status is: Inpatient  Remains inpatient appropriate because: Needs further IV diuresis.  Estimated body mass index is  18.11 kg/m as calculated from the following:   Height as of this encounter: 5\' 11"  (1.803 m).   Weight as of this encounter: 58.9 kg.     Nutritional Assessment: Body mass index is 18.11 kg/m.Marland Kitchen Seen by dietician.  I agree with the assessment and plan as outlined below: Nutrition Status:  Skin Assessment: I have examined the patient's skin and I agree with the wound assessment as  performed by the wound care RN as outlined below:    Consultants:  Cardiology  Procedures:  None  Antimicrobials:  Anti-infectives (From admission, onward)    None          Subjective:  Patient seen and examined.  He has no complaints.  Objective: Vitals:   02/13/21 1202 02/13/21 1949 02/14/21 0521 02/14/21 0801  BP: 112/90 95/63 97/66  (!) 87/58  Pulse: 83 67 72   Resp: 18 20 20    Temp: 97.9 F (36.6 C) (!) 97.4 F (36.3 C) (!) 97.3 F (36.3 C)   TempSrc: Oral Oral Oral   SpO2: 99% 99% 98%   Weight:   58.9 kg   Height:        Intake/Output Summary (Last 24 hours) at 02/14/2021 0919 Last data filed at 02/14/2021 0857 Gross per 24 hour  Intake 564.02 ml  Output 4800 ml  Net -4235.98 ml    Filed Weights   02/12/21 0433 02/13/21 0526 02/14/21 0521  Weight: 65.4 kg 66.8 kg 58.9 kg    Examination:  General exam: Appears calm and comfortable  Respiratory system: Clear to auscultation. Respiratory effort normal. Cardiovascular system: S1 & S2 heard, irregularly irregular rate and rhythm. No JVD, murmurs, rubs, gallops or clicks.  +2-3 pitting edema bilateral lower extremity and right upper extremity. Gastrointestinal system: Abdomen is nondistended, soft and nontender. No organomegaly or masses felt. Normal bowel sounds heard. Central nervous system: Alert and oriented. No focal neurological deficits. Extremities: Symmetric 5 x 5 power. Skin: No rashes, lesions or ulcers.  Psychiatry: Judgement and insight appear poor.  Data Reviewed: I have personally reviewed following labs and imaging studies  CBC: Recent Labs  Lab 02/07/21 2227 02/09/21 0412  WBC 5.4 4.8  NEUTROABS  --  3.0  HGB 12.7* 11.8*  HCT 40.5 37.1*  MCV 95.7 95.1  PLT 127* 121*    Basic Metabolic Panel: Recent Labs  Lab 02/07/21 2253 02/09/21 0412 02/10/21 0319 02/11/21 0257 02/12/21 0246 02/13/21 0151 02/14/21 0224  NA  --    < > 134* 136 132* 136 134*  K  --    < > 3.5 4.6  3.8 4.0 3.3*  CL  --    < > 101 101 100 100 99  CO2  --    < > 25 28 24 28 26   GLUCOSE  --    < > 91 98 126* 87 99  BUN  --    < > 21 23 26* 26* 22  CREATININE  --    < > 1.21 1.44* 1.32* 1.37* 1.30*  CALCIUM  --    < > 8.3* 8.7* 8.6* 8.7* 8.4*  PHOS 3.3  --   --   --   --   --   --    < > = values in this interval not displayed.    GFR: Estimated Creatinine Clearance: 37.8 mL/min (A) (by C-G formula based on SCr of 1.3 mg/dL (H)). Liver Function Tests: No results for input(s): AST, ALT, ALKPHOS, BILITOT, PROT, ALBUMIN in the last 168  hours. No results for input(s): LIPASE, AMYLASE in the last 168 hours. No results for input(s): AMMONIA in the last 168 hours. Coagulation Profile: Recent Labs  Lab 02/07/21 2227  INR 2.8*    Cardiac Enzymes: No results for input(s): CKTOTAL, CKMB, CKMBINDEX, TROPONINI in the last 168 hours. BNP (last 3 results) Recent Labs    01/27/21 1144  PROBNP 1,995.0*    HbA1C: No results for input(s): HGBA1C in the last 72 hours. CBG: No results for input(s): GLUCAP in the last 168 hours. Lipid Profile: No results for input(s): CHOL, HDL, LDLCALC, TRIG, CHOLHDL, LDLDIRECT in the last 72 hours. Thyroid Function Tests: No results for input(s): TSH, T4TOTAL, FREET4, T3FREE, THYROIDAB in the last 72 hours. Anemia Panel: No results for input(s): VITAMINB12, FOLATE, FERRITIN, TIBC, IRON, RETICCTPCT in the last 72 hours. Sepsis Labs: No results for input(s): PROCALCITON, LATICACIDVEN in the last 168 hours.  Recent Results (from the past 240 hour(s))  Resp Panel by RT-PCR (Flu A&B, Covid) Nasopharyngeal Swab     Status: None   Collection Time: 02/07/21 10:28 PM   Specimen: Nasopharyngeal Swab; Nasopharyngeal(NP) swabs in vial transport medium  Result Value Ref Range Status   SARS Coronavirus 2 by RT PCR NEGATIVE NEGATIVE Final    Comment: (NOTE) SARS-CoV-2 target nucleic acids are NOT DETECTED.  The SARS-CoV-2 RNA is generally detectable in upper  respiratory specimens during the acute phase of infection. The lowest concentration of SARS-CoV-2 viral copies this assay can detect is 138 copies/mL. A negative result does not preclude SARS-Cov-2 infection and should not be used as the sole basis for treatment or other patient management decisions. A negative result may occur with  improper specimen collection/handling, submission of specimen other than nasopharyngeal swab, presence of viral mutation(s) within the areas targeted by this assay, and inadequate number of viral copies(<138 copies/mL). A negative result must be combined with clinical observations, patient history, and epidemiological information. The expected result is Negative.  Fact Sheet for Patients:  EntrepreneurPulse.com.au  Fact Sheet for Healthcare Providers:  IncredibleEmployment.be  This test is no t yet approved or cleared by the [place] FDA and  has been authorized for detection and/or diagnosis of SARS-CoV-2 by FDA under an Emergency Use Authorization (EUA). This EUA will remain  in effect (meaning this test can be used) for the duration of the COVID-19 declaration under Section 564(b)(1) of the Act, 21 U.S.C.section 360bbb-3(b)(1), unless the authorization is terminated  or revoked sooner.       Influenza A by PCR NEGATIVE NEGATIVE Final   Influenza B by PCR NEGATIVE NEGATIVE Final    Comment: (NOTE) The Xpert Xpress SARS-CoV-2/FLU/RSV plus assay is intended as an aid in the diagnosis of influenza from Nasopharyngeal swab specimens and should not be used as a sole basis for treatment. Nasal washings and aspirates are unacceptable for Xpert Xpress SARS-CoV-2/FLU/RSV testing.  Fact Sheet for Patients: EntrepreneurPulse.com.au  Fact Sheet for Healthcare Providers: IncredibleEmployment.be  This test is not yet approved or cleared by the [place] FDA and has been  authorized for detection and/or diagnosis of SARS-CoV-2 by FDA under an Emergency Use Authorization (EUA). This EUA will remain in effect (meaning this test can be used) for the duration of the COVID-19 declaration under Section 564(b)(1) of the Act, 21 U.S.C. section 360bbb-3(b)(1), unless the authorization is terminated or revoked.  Performed at [facility], [place] [address] [place], [place] [address]        Radiology Studies: No results found.  Scheduled Meds:  atorvastatin  80 mg Oral QHS   carvedilol  3.125 mg Oral BID WC   divalproex  500 mg Oral QHS   ferrous sulfate  325 mg Oral Q breakfast   pantoprazole  40 mg Oral Daily   potassium chloride  40 mEq Oral BID   rivaroxaban  15 mg Oral Q supper   senna-docusate  2 tablet Oral QHS   sodium chloride flush  3 mL Intravenous Q12H   traZODone  100 mg Oral QHS   Continuous Infusions:  sodium chloride     furosemide (LASIX) 200 mg in dextrose 5% 100 mL (2mg /mL) infusion 10 mg/hr (02/14/21 0615)     LOS: 6 days   Time spent: 27 minutes   Darliss Cheney, MD [facility]  02/14/2021, 9:19 AM  Please page via Shea [place] and do not message via secure chat for anything urgent. Secure chat can be used for anything non urgent.  How to contact the [facility] Attending or Consulting provider [place] or covering provider during after hours [place], for this patient?  Check the care team in [facility] and look for a) attending/consulting TR[facility] provider listed and b) the [facility] team listed. Page or secure chat [phone]. Log into [email or web address] and use [place]'s universal password to access. If you do not have the password, please contact the hospital operator. Locate the [facility] provider you are looking for under [facility] and page to a number that you can be directly reached. If you still have difficulty reaching the provider, please page the [facility] (Director on Call) for the Hospitalists listed on amion for assistance.

## 2021-02-14 NOTE — Progress Notes (Signed)
This chaplain responded to PMT referral for spiritual care after the death of the Pt. nephew. The Pt. family joined the chaplain and Pt. at the bedside.  The Pt. is awake, eating a roll, and willing to talk to the chaplain. The Pt. accepted the chaplain's invitation for a time of story telling and prayer. The chaplain understands the Pt. is grieving the loss of his nephew and is accepting of their relationship.  This chaplain is available for F/U spiritual care as needed.  Chaplain Sallyanne Kuster [phone]

## 2021-02-14 NOTE — Progress Notes (Signed)
Progress Note  Patient Name: Mark Clayton Date of Encounter: 02/14/2021  Primary Cardiologist: Candee Furbish, MD   Subjective   Diuresing well, appears brighter today.   Inpatient Medications    Scheduled Meds:  atorvastatin  80 mg Oral QHS   carvedilol  3.125 mg Oral BID WC   divalproex  500 mg Oral QHS   ferrous sulfate  325 mg Oral Q breakfast   pantoprazole  40 mg Oral Daily   potassium chloride  40 mEq Oral BID   rivaroxaban  15 mg Oral Q supper   senna-docusate  2 tablet Oral QHS   sodium chloride flush  3 mL Intravenous Q12H   traZODone  100 mg Oral QHS   Continuous Infusions:  sodium chloride     furosemide (LASIX) 200 mg in dextrose 5% 100 mL (2mg /mL) infusion 10 mg/hr (02/14/21 0615)   PRN Meds: sodium chloride, acetaminophen, sodium chloride flush   Vital Signs    Vitals:   02/13/21 1202 02/13/21 1949 02/14/21 0521 02/14/21 0801  BP: 112/90 95/63 97/66  (!) 87/58  Pulse: 83 67 72   Resp: 18 20 20    Temp: 97.9 F (36.6 C) (!) 97.4 F (36.3 C) (!) 97.3 F (36.3 C)   TempSrc: Oral Oral Oral   SpO2: 99% 99% 98%   Weight:   58.9 kg   Height:        Intake/Output Summary (Last 24 hours) at 02/14/2021 1148 Last data filed at 02/14/2021 1129 Gross per 24 hour  Intake 564.02 ml  Output 5000 ml  Net -4435.98 ml   Filed Weights   02/12/21 0433 02/13/21 0526 02/14/21 0521  Weight: 65.4 kg 66.8 kg 58.9 kg    Telemetry    Afib - Personally Reviewed  ECG    Afib LBBB - Personally Reviewed  Physical Exam   GEN: No acute distress.   Neck: No JVD Cardiac: iRRR, no murmurs, rubs, or gallops.  Respiratory: Clear to auscultation bilaterally. GI: Soft, nontender, non-distended  MS: 1+ left ankle edema; No deformity. Neuro:  Nonfocal  Psych: Normal affect   Labs    Chemistry Recent Labs  Lab 02/12/21 0246 02/13/21 0151 02/14/21 0224  NA 132* 136 134*  K 3.8 4.0 3.3*  CL 100 100 99  CO2 24 28 26   GLUCOSE 126* 87 99  BUN 26* 26* 22   CREATININE 1.32* 1.37* 1.30*  CALCIUM 8.6* 8.7* 8.4*  GFRNONAA 55* 52* 56*  ANIONGAP 8 8 9      Hematology Recent Labs  Lab 02/07/21 2227 02/09/21 0412  WBC 5.4 4.8  RBC 4.23 3.90*  HGB 12.7* 11.8*  HCT 40.5 37.1*  MCV 95.7 95.1  MCH 30.0 30.3  MCHC 31.4 31.8  RDW 20.5* 19.9*  PLT 127* 121*    Cardiac EnzymesNo results for input(s): TROPONINI in the last 168 hours. No results for input(s): TROPIPOC in the last 168 hours.   BNP Recent Labs  Lab 02/07/21 2254  BNP 2,099.8*     DDimer No results for input(s): DDIMER in the last 168 hours.   Radiology    No results found.  Cardiac Studies   As above  Patient Profile     80 y.o. male with a hx of (HFrEF) heart failure with reduced ejection fraction due to ischemic CM with EF < 20, coronary artery disease s/p CABG in 2003, DES to the native RCA and DES to S-RPDA (jump graft to RPL chronically occluded), permanent atrial fibrillation on chronic anticoagulation,  VT, s/p ICD, prior LV mural thrombus resolved on f/u studies, chronic kidney disease, DNR, hypertension, hyperlipidemia here for congestive heart failure.  Assessment & Plan   Principal Problem:   Acute on chronic systolic CHF (congestive heart failure) (HCC) Active Problems:   Hyperlipidemia   Cardiac defibrillator  MDT VVI   Persistent atrial fibrillation   Coronary artery disease involving native heart with angina pectoris (HCC)   Essential hypertension   Chronic anticoagulation   CHF (congestive heart failure) (HCC)   Acute systolic heart failure - seems to be responding well to lasix infusion. Continue today, rate 10 mg/hr. - Consider REDSCLIP evaluation after aggressive diuresis to better understand volume status.  -Unable to utilize traditional goal-directed medical therapy due to frailty and hypotension, however continue BB, will consider SGLT2I as outpatient if affordable for patient. BB held this am for hypotension.  - renal function has stayed  stable - replete potassium, agree with 40 meq BID po Kcl.   CAD - CABG 2003 stent to RCA in 2016 and stent to SVG vein graft in 2020.  No anginal symptoms.  No aspirin because of Xarelto. Continue statin and BB.   Permanent atrial fibrillation - Currently well rate controlled. - on Xarelto 15 mg for chronic anticoagulation based on GFR less than 50.   DNR - Strongly recommend continued palliative care discussions, may be best to have when family present. They are coming today per patient.     For questions or updates, please contact [place] Please consult [email or web address] for contact info under        Signed, Elouise Munroe, MD  02/14/2021, 11:48 AM

## 2021-02-14 NOTE — Progress Notes (Signed)
OT Cancellation Note  Patient Details Name: Mark Clayton MRN: [id number] DOB: [birth year]   Cancelled Treatment:    Reason Eval/Treat Not Completed: Other (comment). "I am waiting on a grilled cheese and I don't want to get up until after I eat it"  [place], OTR/L Acute Rehab Services Pager [phone] Office [phone]    Almon Register 02/14/2021, 3:47 PM

## 2021-02-14 NOTE — Progress Notes (Signed)
Physical Therapy Treatment Patient Details Name: Mark Clayton MRN: [id number] DOB: [birth year] Today's Date: 02/14/2021   History of Present Illness Pt is an 80 y.o. male who presented 02/07/21 with lower extremity edema and swelling into his abdomen along with malaise, cough, orthopnea, and wheezing. Chest x-ray noted cardiomegaly with small bilateral pleural effusions. Pt admitted with acute on chronic systolic congestive heart failure. PMH: HTN, HLD, PAF on chronic anticoagulation, s/p AICD, LV thrombus, ICM, CAD s/p CABG, CVA, colon cancer s/p sigmoid colon resection    PT Comments    Pt agreeable to ambulating to commode to have bowel movement then ambulating again within the room. Pt displays deficits in R lower extremity strength and muscle coordination from his prior CVA, affecting his ability to advance it during gait and maintain knee stability during stance, displaying knee hyperextension. Pt requires min guard-minA for short bedroom distance mobility with a RW and min-modA for bed mobility and transfers due to his deficits in power, strength, balance, and coordination. Pt refusing to sit in recliner end of session, thus returned to bed and opened window blinds. Will continue to follow acutely. Current recommendations remain appropriate.    Recommendations for follow up therapy are one component of a multi-disciplinary discharge planning process, led by the attending physician.  Recommendations may be updated based on patient status, additional functional criteria and insurance authorization.  Follow Up Recommendations  Home health PT     Assistance Recommended at Discharge Frequent or constant Supervision/Assistance  Equipment Recommendations  None recommended by PT    Recommendations for Other Services       Precautions / Restrictions Precautions Precautions: Fall Restrictions Weight Bearing Restrictions: No     Mobility  Bed Mobility Overal bed mobility: Needs  Assistance Bed Mobility: Sit to Supine;Supine to Sit;Rolling Rolling: Mod assist;Min guard   Supine to sit: HOB elevated;Mod assist Sit to supine: Mod assist   General bed mobility comments: MinA to ascend trunk, modA to scoot hips to EOB. ModA to manage legs onto bed with transition sit > supine. Min guard to roll to R using bed rail and modA to roll to L.    Transfers Overall transfer level: Needs assistance Equipment used: Rolling walker (2 wheels) Transfers: Sit to/from Stand Sit to Stand: Min assist;Mod assist           General transfer comment: Pt with difficulty extending knees and hips to come to stand from EOB first rep, requiring modA to complete. Pt with better power up, only needing minA, on 2nd rep from commode. Cues for hand placement repeatedly    Ambulation/Gait Ambulation/Gait assistance: Min guard;Min assist Gait Distance (Feet): 20 Feet (x2 bouts of ~10 ft > ~20 ft) Assistive device: Rolling walker (2 wheels) Gait Pattern/deviations: Step-through pattern;Decreased stride length;Trunk flexed;Decreased step length - right;Decreased dorsiflexion - right;Knee hyperextension - right Gait velocity: reduced Gait velocity interpretation: <1.31 ft/sec, indicative of household ambulator   General Gait Details: Pt with slow gait, dragging R foot with report of hx of weakness from prior CVA, no R foot clearance off ground even with cues including tactile cues during swing. Pt required min guard-minA for stability and blocking of R knee in stance to prevent noted hyperextension. Fatigues quickly, standing to rest a couple times each bout.   Stairs             Wheelchair Mobility    Modified Rankin (Stroke Patients Only)       Balance Overall balance assessment: Needs assistance Sitting-balance  support: No upper extremity supported;Feet supported Sitting balance-Leahy Scale: Fair     Standing balance support: Reliant on assistive device for  balance Standing balance-Leahy Scale: Poor Standing balance comment: Reliant on RW                            Cognition Arousal/Alertness: Awake/alert Behavior During Therapy: WFL for tasks assessed/performed Overall Cognitive Status: No family/caregiver present to determine baseline cognitive functioning                                 General Comments: Pt with slow processing, but also HOH. Pt with some STM deficits.        Exercises      General Comments General comments (skin integrity, edema, etc.): Educated pt on importance of staying OOB but pt refusing to sit in recliner end of session, thus returned to bed; notified RN of bed saturated in urine and changed by PT and that condom catheter fell off; BP soft but stable      Pertinent Vitals/Pain Pain Assessment: Faces Faces Pain Scale: Hurts a little bit Pain Location: bil knees Pain Descriptors / Indicators: Discomfort;Guarding Pain Intervention(s): Limited activity within patient's tolerance;Monitored during session;Repositioned    Home Living                          Prior Function            PT Goals (current goals can now be found in the care plan section) Acute Rehab PT Goals Patient Stated Goal: to go home soon PT Goal Formulation: With patient Time For Goal Achievement: 02/23/21 Potential to Achieve Goals: Fair Progress towards PT goals: Progressing toward goals    Frequency    Min 3X/week      PT Plan Current plan remains appropriate    Co-evaluation              AM-PAC PT "6 Clicks" Mobility   Outcome Measure  Help needed turning from your back to your side while in a flat bed without using bedrails?: A Lot Help needed moving from lying on your back to sitting on the side of a flat bed without using bedrails?: A Lot Help needed moving to and from a bed to a chair (including a wheelchair)?: A Lot Help needed standing up from a chair using your arms  (e.g., wheelchair or bedside chair)?: A Lot Help needed to walk in hospital room?: A Lot (mod cues) Help needed climbing 3-5 steps with a railing? : A Lot 6 Click Score: 12    End of Session Equipment Utilized During Treatment: Gait belt Activity Tolerance: Patient tolerated treatment well Patient left: in bed;with call bell/phone within reach;with bed alarm set Nurse Communication: Mobility status PT Visit Diagnosis: Unsteadiness on feet (R26.81);Other abnormalities of gait and mobility (R26.89);Muscle weakness (generalized) (M62.81);Difficulty in walking, not elsewhere classified (R26.2)     Time: 6712-4580 PT Time Calculation (min) (ACUTE ONLY): 47 min  Charges:  $Gait Training: 23-37 mins $Therapeutic Activity: 8-22 mins                     Moishe Spice, PT, DPT Acute Rehabilitation Services  Pager: [phone] Office: [place] 02/14/2021, 11:21 AM

## 2021-02-15 DIAGNOSIS — I5023 Acute on chronic systolic (congestive) heart failure: Secondary | ICD-10-CM | POA: Diagnosis not present

## 2021-02-15 LAB — BASIC METABOLIC PANEL
Anion gap: 9 (ref 5–15)
BUN: 18 mg/dL (ref 8–23)
CO2: 27 mmol/L (ref 22–32)
Calcium: 8.5 mg/dL — ABNORMAL LOW (ref 8.9–10.3)
Chloride: 100 mmol/L (ref 98–111)
Creatinine, Ser: 1.13 mg/dL (ref 0.61–1.24)
GFR, Estimated: >60 mL/min (ref >60)
Glucose, Bld: 96 mg/dL (ref 70–99)
Potassium: 3.7 mmol/L (ref 3.5–5.1)
Sodium: 136 mmol/L (ref 135–145)

## 2021-02-15 LAB — MAGNESIUM: Magnesium: 2.1 mg/dL (ref 1.7–2.4)

## 2021-02-15 MED ORDER — FUROSEMIDE 10 MG/ML IJ SOLN
8.0000 mg/h | INTRAVENOUS | Status: DC
  Administered 2021-02-15: 10 mg/h via INTRAVENOUS
  Filled 2021-02-15 (×3): qty 20

## 2021-02-15 MED ORDER — POTASSIUM CHLORIDE CRYS ER 20 MEQ PO TBCR: 40.0000 meq | EXTENDED_RELEASE_TABLET | Freq: Once | ORAL | Status: DC

## 2021-02-15 MED ORDER — POLYETHYLENE GLYCOL 3350 17 G PO PACK: 17.0000 g | PACK | Freq: Every day | ORAL | Status: DC | PRN

## 2021-02-15 MED ORDER — BISACODYL 10 MG RE SUPP: 10.0000 mg | Freq: Every day | RECTAL | Status: DC | PRN

## 2021-02-15 MED ORDER — POTASSIUM CHLORIDE 20 MEQ PO PACK
40.0000 meq | PACK | Freq: Two times a day (BID) | ORAL | Status: DC
  Administered 2021-02-15 – 2021-02-17 (×6): 40 meq via ORAL
  Filled 2021-02-15 (×6): qty 2

## 2021-02-15 NOTE — Plan of Care (Signed)
  Problem: Education: Goal: Ability to demonstrate management of disease process will improve Outcome: Progressing Goal: Individualized Educational Video(s) Outcome: Progressing   Problem: Education: Goal: Knowledge of General Education information will improve Description: Including pain rating scale, medication(s)/side effects and non-pharmacologic comfort measures Outcome: Progressing

## 2021-02-15 NOTE — Progress Notes (Signed)
Progress Note  Patient Name: Mark Clayton Date of Encounter: 02/15/2021  [facility] Cardiologist: Candee Furbish, MD   Subjective   No complaints  Inpatient Medications    Scheduled Meds:  atorvastatin  80 mg Oral QHS   carvedilol  3.125 mg Oral BID WC   divalproex  500 mg Oral QHS   ferrous sulfate  325 mg Oral Q breakfast   pantoprazole  40 mg Oral Daily   potassium chloride  40 mEq Oral BID   rivaroxaban  15 mg Oral Q supper   senna-docusate  2 tablet Oral QHS   sodium chloride flush  3 mL Intravenous Q12H   traZODone  100 mg Oral QHS   Continuous Infusions:  sodium chloride     furosemide (LASIX) 200 mg in dextrose 5% 100 mL (2mg /mL) infusion 10 mg/hr (02/15/21 0906)   PRN Meds: sodium chloride, acetaminophen, sodium chloride flush   Vital Signs    Vitals:   02/14/21 1520 02/14/21 2025 02/15/21 0433 02/15/21 0758  BP: 103/64 95/61 (!) 105/59 104/77  Pulse:  68 82 81  Resp:  20 20   Temp:  97.7 F (36.5 C) 98.1 F (36.7 C)   TempSrc:  Oral Oral   SpO2:  96% 96%   Weight:   56.2 kg   Height:        Intake/Output Summary (Last 24 hours) at 02/15/2021 0912 Last data filed at 02/15/2021 0830 Gross per 24 hour  Intake 360 ml  Output 4550 ml  Net -4190 ml   Last 3 Weights 02/15/2021 02/14/2021 02/13/2021  Weight (lbs) 123 lb 14.4 oz 129 lb 13.6 oz 147 lb 4.3 oz  Weight (kg) 56.2 kg 58.9 kg 66.8 kg      Telemetry    Afib rate controlled, 13 beat run NSVT - Personally Reviewed  ECG    N/a - Personally Reviewed  Physical Exam   GEN: No acute distress.   Neck: No JVD Cardiac: irreg, no m/r/g  Respiratory: bilateral crackles GI: Soft, nontender, non-distended  MS: 1+ bilateral LE edema Neuro:  Nonfocal  Psych: Normal affect   Labs    High Sensitivity Troponin:  No results for input(s): TROPONINIHS in the last 720 hours.   Chemistry Recent Labs  Lab 02/13/21 0151 02/14/21 0224 02/15/21 0238  NA 136 134* 136  K 4.0 3.3* 3.7  CL  100 99 100  CO2 28 26 27   GLUCOSE 87 99 96  BUN 26* 22 18  CREATININE 1.37* 1.30* 1.13  CALCIUM 8.7* 8.4* 8.5*  MG  --   --  2.1  GFRNONAA 52* 56* >60  ANIONGAP 8 9 9     Lipids No results for input(s): CHOL, TRIG, HDL, LABVLDL, LDLCALC, CHOLHDL in the last 168 hours.  Hematology Recent Labs  Lab 02/09/21 0412  WBC 4.8  RBC 3.90*  HGB 11.8*  HCT 37.1*  MCV 95.1  MCH 30.3  MCHC 31.8  RDW 19.9*  PLT 121*   Thyroid No results for input(s): TSH, FREET4 in the last 168 hours.  BNPNo results for input(s): BNP, PROBNP in the last 168 hours.  DDimer No results for input(s): DDIMER in the last 168 hours.   Radiology    No results found.  Cardiac Studies     Patient Profile     80 y.o. male with a hx of (HFrEF) heart failure with reduced ejection fraction due to ischemic CM with EF < 20, coronary artery disease s/p CABG in 2003, DES  to the native RCA and DES to S-RPDA (jump graft to RPL chronically occluded), permanent atrial fibrillation on chronic anticoagulation, VT, s/p ICD, prior LV mural thrombus resolved on f/u studies, chronic kidney disease, DNR, hypertension, hyperlipidemia here for congestive heart failure.  Assessment & Plan    1.Acute on chronic systolic HF/ History of ICM - 01/2021 echo <20%, grade III dd, normal RV function, severe LAE - 05/2019 echo LVEF < 20%  - on lasix gtt at 10mg /hr. Neg 4.3 L yesterday, neg 13.4 L since admission. Cr is downtrending with diuresis consistent with venous congestion and CHF. Continue lasix drip today, remains fluid overloaded - medical therapy with coreg 3.125mg  bid. Medical therapyl limited by low bp's.    2.CAD -  CABG 2003 stent to RCA in 2016 and stent to SVG vein graft in 2020.  - no active issues  3. Permanent afib - rate controlled on coreg, he ison xarelto for stroke prevention.      For questions or updates, please contact [place] Please consult [email or web address] for contact info under         Signed, Carlyle Dolly, MD  02/15/2021, 9:12 AM

## 2021-02-15 NOTE — Progress Notes (Signed)
PROGRESS NOTE    Mark Clayton  TGY:[id number] DOB: 06-04-1940 DOA: 02/07/2021 PCP: [name], MD   Brief Narrative:  Mark Clayton is a 80 y.o. male with medical history significant of HTN, HLD, PAF on chronic anticoagulation, s/p AICD, LV thrombus, ICM, CAD s/p CABG, CVA, colon cancer s/p sigmoid colon resection who presented with complaints of progressively worsening swelling over the last 2 -3 weeks.  Diuresing well on lasix gtt under the supervision of cards.      Assessment & Plan:   Principal Problem:   Acute on chronic systolic CHF (congestive heart failure) (HCC) Active Problems:   Hyperlipidemia   Cardiac defibrillator  MDT VVI   Persistent atrial fibrillation   Coronary artery disease involving native heart with angina pectoris [facility])   Essential hypertension   Chronic anticoagulation   CHF (congestive heart failure) (HCC)  Acute on chronic combined systolic and diastolic congestive heart failure: BNP of 2099.8.  Chest x-ray noted cardiomegaly with small bilateral pleural effusions.  Last echocardiogram noted EF of less than 20% with global hypokinesis in 05/2019 and grade 2 diastolic dysfunction.  -lasix gtt for at least 24 more hours  Permanent atrial fibrillation on chronic anticoagulation: Rate controlled.  Continue Xarelto and Coreg   Essential hypertension: Blood pressure soft intermittently.  Continue home Coreg 3.125 mg p.o. twice daily with holding parameters  CAD s/p CABG: Last cardiac cath from 11/22/2018 noted multivessel coronary artery disease with successful PCI to SVG to RPDA with a drug-eluting stent.  Asymptomatic.   History of VT ischemic cardiomyopathy s/p AICD  Hypokalemia:  -replete   Hyponatremia: Acute.  Resolved.   Chronic kidney disease stage IIIa: Creatinine has been s   Hyperlipidemia -Continue atorvastatin   GERD -Continue Protonix   DVT prophylaxis:    Code Status: DNR  Family Communication:  None present at  bedside.  Discussed with patient. Status is: Inpatient  Remains inpatient appropriate because: Needs further IV diuresis.   Consultants:  Cardiology      Subjective:  No SOB, no CP  Objective: Vitals:   02/14/21 2025 02/15/21 0433 02/15/21 0758 02/15/21 1121  BP: 95/61 (!) 105/59 104/77 93/60  Pulse: 68 82 81 76  Resp: 20 20  19   Temp: 97.7 F (36.5 C) 98.1 F (36.7 C)  (!) 97.4 F (36.3 C)  TempSrc: Oral Oral  Oral  SpO2: 96% 96%  98%  Weight:  56.2 kg    Height:        Intake/Output Summary (Last 24 hours) at 02/15/2021 1212 Last data filed at 02/15/2021 1100 Gross per 24 hour  Intake 460 ml  Output 4350 ml  Net -3890 ml   Filed Weights   02/13/21 0526 02/14/21 0521 02/15/21 0433  Weight: 66.8 kg 58.9 kg 56.2 kg    Examination:   General: Appearance:    Thin  frail appearing male in no acute distress     Lungs:     respirations unlabored  Heart:    Normal heart rate.   MS:   All extremities are intact.    Neurologic:   Awake, alert, oriented x 3. No apparent focal neurological           defect.      Data Reviewed: I have personally reviewed following labs and imaging studies  CBC: Recent Labs  Lab 02/09/21 0412  WBC 4.8  NEUTROABS 3.0  HGB 11.8*  HCT 37.1*  MCV 95.1  PLT 287*   Basic Metabolic  Panel: Recent Labs  Lab 02/11/21 0257 02/12/21 0246 02/13/21 0151 02/14/21 0224 02/15/21 0238  NA 136 132* 136 134* 136  K 4.6 3.8 4.0 3.3* 3.7  CL 101 100 100 99 100  CO2 28 24 28 26 27   GLUCOSE 98 126* 87 99 96  BUN 23 26* 26* 22 18  CREATININE 1.44* 1.32* 1.37* 1.30* 1.13  CALCIUM 8.7* 8.6* 8.7* 8.4* 8.5*  MG  --   --   --   --  2.1   GFR: Estimated Creatinine Clearance: 41.4 mL/min (by C-G formula based on SCr of 1.13 mg/dL). Liver Function Tests: No results for input(s): AST, ALT, ALKPHOS, BILITOT, PROT, ALBUMIN in the last 168 hours. No results for input(s): LIPASE, AMYLASE in the last 168 hours. No results for input(s):  AMMONIA in the last 168 hours. Coagulation Profile: No results for input(s): INR, PROTIME in the last 168 hours. Cardiac Enzymes: No results for input(s): CKTOTAL, CKMB, CKMBINDEX, TROPONINI in the last 168 hours. BNP (last 3 results) Recent Labs    01/27/21 1144  PROBNP 1,995.0*   HbA1C: No results for input(s): HGBA1C in the last 72 hours. CBG: No results for input(s): GLUCAP in the last 168 hours. Lipid Profile: No results for input(s): CHOL, HDL, LDLCALC, TRIG, CHOLHDL, LDLDIRECT in the last 72 hours. Thyroid Function Tests: No results for input(s): TSH, T4TOTAL, FREET4, T3FREE, THYROIDAB in the last 72 hours. Anemia Panel: No results for input(s): VITAMINB12, FOLATE, FERRITIN, TIBC, IRON, RETICCTPCT in the last 72 hours. Sepsis Labs: No results for input(s): PROCALCITON, LATICACIDVEN in the last 168 hours.  Recent Results (from the past 240 hour(s))  Resp Panel by RT-PCR (Flu A&B, Covid) Nasopharyngeal Swab     Status: None   Collection Time: 02/07/21 10:28 PM   Specimen: Nasopharyngeal Swab; Nasopharyngeal(NP) swabs in vial transport medium  Result Value Ref Range Status   SARS Coronavirus 2 by RT PCR NEGATIVE NEGATIVE Final    Comment: (NOTE) SARS-CoV-2 target nucleic acids are NOT DETECTED.  The SARS-CoV-2 RNA is generally detectable in upper respiratory specimens during the acute phase of infection. The lowest concentration of SARS-CoV-2 viral copies this assay can detect is 138 copies/mL. A negative result does not preclude SARS-Cov-2 infection and should not be used as the sole basis for treatment or other patient management decisions. A negative result may occur with  improper specimen collection/handling, submission of specimen other than nasopharyngeal swab, presence of viral mutation(s) within the areas targeted by this assay, and inadequate number of viral copies(<138 copies/mL). A negative result must be combined with clinical observations, patient history,  and epidemiological information. The expected result is Negative.  Fact Sheet for Patients:  EntrepreneurPulse.com.au  Fact Sheet for Healthcare Providers:  IncredibleEmployment.be  This test is no t yet approved or cleared by the [place] FDA and  has been authorized for detection and/or diagnosis of SARS-CoV-2 by FDA under an Emergency Use Authorization (EUA). This EUA will remain  in effect (meaning this test can be used) for the duration of the COVID-19 declaration under Section 564(b)(1) of the Act, 21 U.S.C.section 360bbb-3(b)(1), unless the authorization is terminated  or revoked sooner.       Influenza A by PCR NEGATIVE NEGATIVE Final   Influenza B by PCR NEGATIVE NEGATIVE Final    Comment: (NOTE) The Xpert Xpress SARS-CoV-2/FLU/RSV plus assay is intended as an aid in the diagnosis of influenza from Nasopharyngeal swab specimens and should not be used as a sole basis for treatment. Nasal  washings and aspirates are unacceptable for Xpert Xpress SARS-CoV-2/FLU/RSV testing.  Fact Sheet for Patients: EntrepreneurPulse.com.au  Fact Sheet for Healthcare Providers: IncredibleEmployment.be  This test is not yet approved or cleared by the [place] FDA and has been authorized for detection and/or diagnosis of SARS-CoV-2 by FDA under an Emergency Use Authorization (EUA). This EUA will remain in effect (meaning this test can be used) for the duration of the COVID-19 declaration under Section 564(b)(1) of the Act, 21 U.S.C. section 360bbb-3(b)(1), unless the authorization is terminated or revoked.  Performed at [facility], [place] [address] [place], [place] [address]        Radiology Studies: No results found.  Scheduled Meds:  atorvastatin  80 mg Oral QHS   carvedilol  3.125 mg Oral BID WC   divalproex  500 mg Oral QHS   ferrous sulfate  325 mg Oral Q breakfast   pantoprazole  40  mg Oral Daily   potassium chloride  40 mEq Oral BID   rivaroxaban  15 mg Oral Q supper   senna-docusate  2 tablet Oral QHS   sodium chloride flush  3 mL Intravenous Q12H   traZODone  100 mg Oral QHS   Continuous Infusions:  sodium chloride     furosemide (LASIX) 200 mg in dextrose 5% 100 mL (2mg /mL) infusion       LOS: 7 days   Time spent: 27 minutes   Geradine Girt, DO [facility]  02/15/2021, 12:12 PM  Please page via [place] and do not message via secure chat for anything urgent. Secure chat can be used for anything non urgent.  How to contact the [facility] Attending or Consulting provider [place] or covering provider during after hours [place], for this patient?  Check the care team in [facility] and look for a) attending/consulting TRH provider listed and b) the [facility] team listed. Page or secure chat [phone]. Log into [email or web address] and use [place]'s universal password to access. If you do not have the password, please contact the hospital operator. Locate the [name] provider you are looking for under [facility] and page to a number that you can be directly reached. If you still have difficulty reaching the provider, please page the [facility] (Director on Call) for the Hospitalists listed on amion for assistance.

## 2021-02-15 NOTE — Progress Notes (Signed)
PT Cancellation Note  Patient Details Name: Mark Clayton MRN: [id number] DOB: 11-10-1940   Cancelled Treatment:    Reason Eval/Treat Not Completed: Fatigue/lethargy limiting ability to participate this afternoon. Pt educated on importance of participating even in limited sessions to maintain strength while admitted, but the pt continued to adamantly decline reporting fatigue. I offered to return later in the afternoon, but the pt states he does not want therapy to return and he will be able to mobilize once he gets back to his house. Will continue to follow and attempt to progress OOB mobility as pt agreeable.   [place], PT, DPT   Acute Rehabilitation Department Pager #: [phone]   Sandra Cockayne 02/15/2021, 2:21 PM

## 2021-02-15 NOTE — Progress Notes (Signed)
Occupational Therapy Treatment Patient Details Name: Mark Clayton MRN: [id number] DOB: May 04, 1940 Today's Date: 02/15/2021   History of present illness Pt is an 80 y.o. male who presented 02/07/21 with lower extremity edema and swelling into his abdomen along with malaise, cough, orthopnea, and wheezing. Chest x-ray noted cardiomegaly with small bilateral pleural effusions. Pt admitted with acute on chronic systolic congestive heart failure. PMH: HTN, HLD, PAF on chronic anticoagulation, s/p AICD, LV thrombus, ICM, CAD s/p CABG, CVA, colon cancer s/p sigmoid colon resection   OT comments  Patient with minimal progression toward patient focused goals.  He was able to sit EOB for grooming task, but declined OOB.  OT did not push mobility as PT will be seeing him later.  At home he has a PCA M-F during the daytime, and she assist with ADL/IADL, meals and mobility as needed.  He was receiving HH OT and PT PTA, and OT recommends a continuation of those services once discharged.     Recommendations for follow up therapy are one component of a multi-disciplinary discharge planning process, led by the attending physician.  Recommendations may be updated based on patient status, additional functional criteria and insurance authorization.    Follow Up Recommendations  Home health OT    Assistance Recommended at Discharge Frequent or constant Supervision/Assistance  Equipment Recommendations  None recommended by OT    Recommendations for Other Services      Precautions / Restrictions Precautions Precautions: Fall Restrictions Weight Bearing Restrictions: No       Mobility Bed Mobility Overal bed mobility: Needs Assistance Bed Mobility: Supine to Sit;Sit to Supine     Supine to sit: HOB elevated;Mod assist Sit to supine: Mod assist   General bed mobility comments: increased tone noted to R LE    Transfers                   General transfer comment: deferred OOB      Balance   Sitting-balance support: No upper extremity supported;Feet supported Sitting balance-Leahy Scale: Good                                     ADL either performed or assessed with clinical judgement   ADL       Grooming: Wash/dry hands;Wash/dry face;Oral care;Brushing hair;Set up;Sitting               Lower Body Dressing: Maximal assistance                      Extremity/Trunk Assessment Upper Extremity Assessment RUE Deficits / Details: old CVA, can use functionally RUE Sensation: WNL RUE Coordination: decreased fine motor;decreased gross motor   Lower Extremity Assessment Lower Extremity Assessment: Defer to PT evaluation   Cervical / Trunk Assessment Cervical / Trunk Assessment: Kyphotic    Vision Ability to See in Adequate Light: 1 Impaired Patient Visual Report: No change from baseline     Perception Perception Perception: Not tested   Praxis Praxis Praxis: Not tested    Cognition Arousal/Alertness: Awake/alert Behavior During Therapy: WFL for tasks assessed/performed Overall Cognitive Status: No family/caregiver present to determine baseline cognitive functioning                                 General Comments: Pt with slow processing, but also HOH. Pt with  some STM deficits.          Exercises     Shoulder Instructions       General Comments      Pertinent Vitals/ Pain       Pain Assessment: Faces Faces Pain Scale: Hurts a little bit Pain Location: bil knees Pain Descriptors / Indicators: Guarding Pain Intervention(s): Monitored during session                                                          Frequency  Min 2X/week        Progress Toward Goals  OT Goals(current goals can now be found in the care plan section)  Progress towards OT goals: Progressing toward goals  Acute Rehab OT Goals Patient Stated Goal: return home OT Goal Formulation: With  patient Time For Goal Achievement: 02/24/21 Potential to Achieve Goals: Good  Plan      Co-evaluation                 AM-PAC OT "6 Clicks" Daily Activity     Outcome Measure   Help from another person eating meals?: A Little   Help from another person toileting, which includes using toliet, bedpan, or urinal?: A Lot Help from another person bathing (including washing, rinsing, drying)?: A Lot Help from another person to put on and taking off regular upper body clothing?: A Lot Help from another person to put on and taking off regular lower body clothing?: Total 6 Click Score: 10    End of Session    OT Visit Diagnosis: Unsteadiness on feet (R26.81);Other abnormalities of gait and mobility (R26.89);Muscle weakness (generalized) (M62.81)   Activity Tolerance Patient tolerated treatment well   Patient Left in bed;with call bell/phone within reach   Nurse Communication          Time: 6629-4765 OT Time Calculation (min): 23 min  Charges: OT General Charges $OT Visit: 1 Visit OT Treatments $Self Care/Home Management : 23-37 mins  02/15/2021  RP, OTR/L  Acute Rehabilitation Services  Office:  [phone]   Mark Clayton 02/15/2021, 10:05 AM

## 2021-02-16 DIAGNOSIS — I5023 Acute on chronic systolic (congestive) heart failure: Secondary | ICD-10-CM | POA: Diagnosis not present

## 2021-02-16 LAB — BASIC METABOLIC PANEL
Anion gap: 9 (ref 5–15)
BUN: 20 mg/dL (ref 8–23)
CO2: 26 mmol/L (ref 22–32)
Calcium: 8.9 mg/dL (ref 8.9–10.3)
Chloride: 98 mmol/L (ref 98–111)
Creatinine, Ser: 1.19 mg/dL (ref 0.61–1.24)
GFR, Estimated: >60 mL/min (ref >60)
Glucose, Bld: 99 mg/dL (ref 70–99)
Potassium: 4.3 mmol/L (ref 3.5–5.1)
Sodium: 133 mmol/L — ABNORMAL LOW (ref 135–145)

## 2021-02-16 NOTE — Progress Notes (Signed)
PROGRESS NOTE    Mark Clayton  QPY:[id number] DOB: [birth year] DOA: 02/07/2021 PCP: [name], MD   Brief Narrative:  Mark Clayton is a 80 y.o. male with medical history significant of HTN, HLD, PAF on chronic anticoagulation, s/p AICD, LV thrombus, ICM, CAD s/p CABG, CVA, colon cancer s/p sigmoid colon resection who presented with complaints of progressively worsening swelling over the last 2 -3 weeks.  Diuresing well on lasix gtt under the supervision of cards.      Assessment & Plan:   Principal Problem:   Acute on chronic systolic CHF (congestive heart failure) (HCC) Active Problems:   Hyperlipidemia   Cardiac defibrillator  MDT VVI   Persistent atrial fibrillation   Coronary artery disease involving native heart with angina pectoris [facility])   Essential hypertension   Chronic anticoagulation   CHF (congestive heart failure) (HCC)  Acute on chronic combined systolic and diastolic congestive heart failure: BNP of 2099.8.  Chest x-ray noted cardiomegaly with small bilateral pleural effusions.  Last echocardiogram noted EF of less than 20% with global hypokinesis in 05/2019 and grade 2 diastolic dysfunction.  -lasix gtt per cards  Permanent atrial fibrillation on chronic anticoagulation: Rate controlled.  Continue Xarelto and Coreg   Essential hypertension: Blood pressure soft intermittently.  Continue home Coreg 3.125 mg p.o. twice daily with holding parameters  CAD s/p CABG: Last cardiac cath from 11/22/2018 noted multivessel coronary artery disease with successful PCI to SVG to RPDA with a drug-eluting stent.  Asymptomatic.   History of VT ischemic cardiomyopathy s/p AICD  Hypokalemia:  -replete   Hyponatremia: Acute.  Resolved.   Chronic kidney disease stage IIIa: Creatinine has been stable   Hyperlipidemia -Continue atorvastatin   GERD -Continue Protonix   DVT prophylaxis:    Code Status: DNR  Family Communication:  None present at bedside.   Discussed with patient. Status is: Inpatient  Remains inpatient appropriate because: Needs further IV diuresis.   Consultants:  Cardiology      Subjective:  Hungry this AM  Objective: Vitals:   02/15/21 1937 02/15/21 2000 02/16/21 0447 02/16/21 1101  BP:  (!) 90/52 97/64 (!) 89/60  Pulse: 73  67 78  Resp: 17  19 18   Temp: 97.6 F (36.4 C)  (!) 97.4 F (36.3 C) (!) 97.5 F (36.4 C)  TempSrc: Oral  Oral Oral  SpO2: 97%  95% 100%  Weight:   53.6 kg   Height:        Intake/Output Summary (Last 24 hours) at 02/16/2021 1106 Last data filed at 02/16/2021 0954 Gross per 24 hour  Intake 724.49 ml  Output 3800 ml  Net -3075.51 ml   Filed Weights   02/14/21 0521 02/15/21 0433 02/16/21 0447  Weight: 58.9 kg 56.2 kg 53.6 kg    Examination:   General: Appearance:    Frail male in no acute distress     Lungs:      respirations unlabored  Heart:    Normal heart rate.   MS:   All extremities are intact.    Neurologic:   Awake, alert, hard of hearing       Data Reviewed: I have personally reviewed following labs and imaging studies  CBC: No results for input(s): WBC, NEUTROABS, HGB, HCT, MCV, PLT in the last 168 hours.  Basic Metabolic Panel: Recent Labs  Lab 02/12/21 0246 02/13/21 0151 02/14/21 0224 02/15/21 0238 02/16/21 0245  NA 132* 136 134* 136 133*  K 3.8 4.0 3.3* 3.7 4.3  CL 100 100 99 100 98  CO2 24 28 26 27 26   GLUCOSE 126* 87 99 96 99  BUN 26* 26* 22 18 20   CREATININE 1.32* 1.37* 1.30* 1.13 1.19  CALCIUM 8.6* 8.7* 8.4* 8.5* 8.9  MG  --   --   --  2.1  --    GFR: Estimated Creatinine Clearance: 37.5 mL/min (by C-G formula based on SCr of 1.19 mg/dL). Liver Function Tests: No results for input(s): AST, ALT, ALKPHOS, BILITOT, PROT, ALBUMIN in the last 168 hours. No results for input(s): LIPASE, AMYLASE in the last 168 hours. No results for input(s): AMMONIA in the last 168 hours. Coagulation Profile: No results for input(s): INR, PROTIME in  the last 168 hours. Cardiac Enzymes: No results for input(s): CKTOTAL, CKMB, CKMBINDEX, TROPONINI in the last 168 hours. BNP (last 3 results) Recent Labs    01/27/21 1144  PROBNP 1,995.0*   HbA1C: No results for input(s): HGBA1C in the last 72 hours. CBG: No results for input(s): GLUCAP in the last 168 hours. Lipid Profile: No results for input(s): CHOL, HDL, LDLCALC, TRIG, CHOLHDL, LDLDIRECT in the last 72 hours. Thyroid Function Tests: No results for input(s): TSH, T4TOTAL, FREET4, T3FREE, THYROIDAB in the last 72 hours. Anemia Panel: No results for input(s): VITAMINB12, FOLATE, FERRITIN, TIBC, IRON, RETICCTPCT in the last 72 hours. Sepsis Labs: No results for input(s): PROCALCITON, LATICACIDVEN in the last 168 hours.  Recent Results (from the past 240 hour(s))  Resp Panel by RT-PCR (Flu A&B, Covid) Nasopharyngeal Swab     Status: None   Collection Time: 02/07/21 10:28 PM   Specimen: Nasopharyngeal Swab; Nasopharyngeal(NP) swabs in vial transport medium  Result Value Ref Range Status   SARS Coronavirus 2 by RT PCR NEGATIVE NEGATIVE Final    Comment: (NOTE) SARS-CoV-2 target nucleic acids are NOT DETECTED.  The SARS-CoV-2 RNA is generally detectable in upper respiratory specimens during the acute phase of infection. The lowest concentration of SARS-CoV-2 viral copies this assay can detect is 138 copies/mL. A negative result does not preclude SARS-Cov-2 infection and should not be used as the sole basis for treatment or other patient management decisions. A negative result may occur with  improper specimen collection/handling, submission of specimen other than nasopharyngeal swab, presence of viral mutation(s) within the areas targeted by this assay, and inadequate number of viral copies(<138 copies/mL). A negative result must be combined with clinical observations, patient history, and epidemiological information. The expected result is Negative.  Fact Sheet for Patients:   EntrepreneurPulse.com.au  Fact Sheet for Healthcare Providers:  IncredibleEmployment.be  This test is no t yet approved or cleared by the [place] FDA and  has been authorized for detection and/or diagnosis of SARS-CoV-2 by FDA under an Emergency Use Authorization (EUA). This EUA will remain  in effect (meaning this test can be used) for the duration of the COVID-19 declaration under Section 564(b)(1) of the Act, 21 U.S.C.section 360bbb-3(b)(1), unless the authorization is terminated  or revoked sooner.       Influenza A by PCR NEGATIVE NEGATIVE Final   Influenza B by PCR NEGATIVE NEGATIVE Final    Comment: (NOTE) The Xpert Xpress SARS-CoV-2/FLU/RSV plus assay is intended as an aid in the diagnosis of influenza from Nasopharyngeal swab specimens and should not be used as a sole basis for treatment. Nasal washings and aspirates are unacceptable for Xpert Xpress SARS-CoV-2/FLU/RSV testing.  Fact Sheet for Patients: EntrepreneurPulse.com.au  Fact Sheet for Healthcare Providers: IncredibleEmployment.be  This test is not yet approved  or cleared by the [place] and has been authorized for detection and/or diagnosis of SARS-CoV-2 by FDA under an Emergency Use Authorization (EUA). This EUA will remain in effect (meaning this test can be used) for the duration of the COVID-19 declaration under Section 564(b)(1) of the Act, 21 U.S.C. section 360bbb-3(b)(1), unless the authorization is terminated or revoked.  Performed at [facility], [place] [address], [place], [place] [address]        Radiology Studies: No results found.  Scheduled Meds:  atorvastatin  80 mg Oral QHS   carvedilol  3.125 mg Oral BID WC   divalproex  500 mg Oral QHS   ferrous sulfate  325 mg Oral Q breakfast   pantoprazole  40 mg Oral Daily   potassium chloride  40 mEq Oral BID   rivaroxaban  15 mg Oral Q supper    senna-docusate  2 tablet Oral QHS   sodium chloride flush  3 mL Intravenous Q12H   traZODone  100 mg Oral QHS   Continuous Infusions:  sodium chloride     furosemide (LASIX) 200 mg in dextrose 5% 100 mL (2mg /mL) infusion 10 mg/hr (02/16/21 0901)     LOS: 8 days   Time spent: 27 minutes   Geradine Girt, DO [facility]  02/16/2021, 11:06 AM  Please page via [place] and do not message via secure chat for anything urgent. Secure chat can be used for anything non urgent.  How to contact the [facility] Attending or Consulting provider [place] or covering provider during after hours [place], for this patient?  Check the care team in [facility] and look for a) attending/consulting [facility] provider listed and b) the [facility] team listed. Page or secure chat [phone]. Log into [email or web address] and use [place]'s universal password to access. If you do not have the password, please contact the hospital operator. Locate the [facility] provider you are looking for under [facility] and page to a number that you can be directly reached. If you still have difficulty reaching the provider, please page the [facility] (Director on Call) for the Hospitalists listed on amion for assistance.

## 2021-02-16 NOTE — Progress Notes (Signed)
Progress Note  Patient Name: Mark Clayton Date of Encounter: 02/16/2021  [facility] Cardiologist: Candee Furbish, MD   Subjective   SOB improving.   Inpatient Medications    Scheduled Meds:  atorvastatin  80 mg Oral QHS   carvedilol  3.125 mg Oral BID WC   divalproex  500 mg Oral QHS   ferrous sulfate  325 mg Oral Q breakfast   pantoprazole  40 mg Oral Daily   potassium chloride  40 mEq Oral BID   rivaroxaban  15 mg Oral Q supper   senna-docusate  2 tablet Oral QHS   sodium chloride flush  3 mL Intravenous Q12H   traZODone  100 mg Oral QHS   Continuous Infusions:  sodium chloride     furosemide (LASIX) 200 mg in dextrose 5% 100 mL (2mg /mL) infusion 10 mg/hr (02/16/21 0901)   PRN Meds: sodium chloride, acetaminophen, bisacodyl, polyethylene glycol, sodium chloride flush   Vital Signs    Vitals:   02/15/21 1652 02/15/21 1937 02/15/21 2000 02/16/21 0447  BP: 93/65  (!) 90/52 97/64  Pulse: 79 73  67  Resp:  17  19  Temp:  97.6 F (36.4 C)  (!) 97.4 F (36.3 C)  TempSrc:  Oral  Oral  SpO2:  97%  95%  Weight:    53.6 kg  Height:        Intake/Output Summary (Last 24 hours) at 02/16/2021 1042 Last data filed at 02/16/2021 0954 Gross per 24 hour  Intake 824.49 ml  Output 3800 ml  Net -2975.51 ml   Last 3 Weights 02/16/2021 02/15/2021 02/14/2021  Weight (lbs) 118 lb 2.7 oz 123 lb 14.4 oz 129 lb 13.6 oz  Weight (kg) 53.6 kg 56.2 kg 58.9 kg      Telemetry    Rate contorlled afib, LBBB - Personally Reviewed  ECG    n'a - Personally Reviewed  Physical Exam   GEN: No acute distress.   Neck: No JVD Cardiac: irreg Respiratory: Clear to auscultation bilaterally. GI: Soft, nontender, non-distended  MS: 1+ bilateral LE edema Neuro:  Nonfocal  Psych: Normal affect   Labs    High Sensitivity Troponin:  No results for input(s): TROPONINIHS in the last 720 hours.   Chemistry Recent Labs  Lab 02/14/21 0224 02/15/21 0238 02/16/21 0245  NA 134* 136  133*  K 3.3* 3.7 4.3  CL 99 100 98  CO2 26 27 26   GLUCOSE 99 96 99  BUN 22 18 20   CREATININE 1.30* 1.13 1.19  CALCIUM 8.4* 8.5* 8.9  MG  --  2.1  --   GFRNONAA 56* >60 >60  ANIONGAP 9 9 9     Lipids No results for input(s): CHOL, TRIG, HDL, LABVLDL, LDLCALC, CHOLHDL in the last 168 hours.  HematologyNo results for input(s): WBC, RBC, HGB, HCT, MCV, MCH, MCHC, RDW, PLT in the last 168 hours. Thyroid No results for input(s): TSH, FREET4 in the last 168 hours.  BNPNo results for input(s): BNP, PROBNP in the last 168 hours.  DDimer No results for input(s): DDIMER in the last 168 hours.   Radiology    No results found.  Cardiac Studies     Patient Profile     80 y.o. male with a hx of (HFrEF) heart failure with reduced ejection fraction due to ischemic CM with EF < 20, coronary artery disease s/p CABG in 2003, DES to the native RCA and DES to S-RPDA (jump graft to RPL chronically occluded), permanent atrial fibrillation  on chronic anticoagulation, VT, s/p ICD, prior LV mural thrombus resolved on f/u studies, chronic kidney disease, DNR, hypertension, hyperlipidemia here for congestive heart failure.  Assessment & Plan    1.Acute on chronic systolic HF/ History of ICM - 01/2021 echo <20%, grade III dd, normal RV function, severe LAE - 05/2019 echo LVEF < 20%   - on lasix gtt at 10mg /hr. Neg 3.1 L yesterday, neg 16.6 L since admission. Cr is downtrending with diuresis consistent with venous congestion and CHF. Overall stable renal funciton, initial downtrend in Cr with diuresis  - medical therapy with coreg 3.125mg  bid. Medical therapyl limited by low bp's.  - remains fluid overloaded though much improved, will lower lasix drip to 8mg /hr     2.CAD -  CABG 2003 stent to RCA in 2016 and stent to SVG vein graft in 2020.  - no active issues   3. Permanent afib - rate controlled on coreg, he is on xarelto for stroke prevention.  For questions or updates, please contact [place] Please consult [email or web address] for contact info under        Signed, Carlyle Dolly, MD  02/16/2021, 10:42 AM

## 2021-02-16 NOTE — Plan of Care (Signed)
  Problem: Cardiac: Goal: Ability to achieve and maintain adequate cardiopulmonary perfusion will improve Outcome: Progressing   Problem: Clinical Measurements: Goal: Will remain free from infection Outcome: Progressing Goal: Cardiovascular complication will be avoided Outcome: Progressing   Problem: Nutrition: Goal: Adequate nutrition will be maintained Outcome: Progressing   Problem: Coping: Goal: Level of anxiety will decrease Outcome: Progressing

## 2021-02-17 DIAGNOSIS — I5023 Acute on chronic systolic (congestive) heart failure: Secondary | ICD-10-CM | POA: Diagnosis not present

## 2021-02-17 LAB — CBC
HCT: 41.1 % (ref 39.0–52.0)
Hemoglobin: 13.1 g/dL (ref 13.0–17.0)
MCH: 30.5 pg (ref 26.0–34.0)
MCHC: 31.9 g/dL (ref 30.0–36.0)
MCV: 95.6 fL (ref 80.0–100.0)
Platelets: 188 10*3/uL (ref 150–400)
RBC: 4.3 MIL/uL (ref 4.22–5.81)
RDW: 18.9 % — ABNORMAL HIGH (ref 11.5–15.5)
WBC: 5.2 10*3/uL (ref 4.0–10.5)
nRBC: 0 % (ref 0.0–0.2)

## 2021-02-17 LAB — BASIC METABOLIC PANEL
Anion gap: 7 (ref 5–15)
BUN: 26 mg/dL — ABNORMAL HIGH (ref 8–23)
CO2: 31 mmol/L (ref 22–32)
Calcium: 9 mg/dL (ref 8.9–10.3)
Chloride: 97 mmol/L — ABNORMAL LOW (ref 98–111)
Creatinine, Ser: 1.2 mg/dL (ref 0.61–1.24)
GFR, Estimated: >60 mL/min (ref >60)
Glucose, Bld: 112 mg/dL — ABNORMAL HIGH (ref 70–99)
Potassium: 4.6 mmol/L (ref 3.5–5.1)
Sodium: 135 mmol/L (ref 135–145)

## 2021-02-17 LAB — BRAIN NATRIURETIC PEPTIDE: B Natriuretic Peptide: 1487.5 pg/mL — ABNORMAL HIGH (ref 0.0–100.0)

## 2021-02-17 MED ORDER — FUROSEMIDE 40 MG PO TABS: 60.0000 mg | ORAL_TABLET | Freq: Every day | ORAL | Status: DC

## 2021-02-17 MED ORDER — ADULT MULTIVITAMIN W/MINERALS CH
1.0000 | ORAL_TABLET | Freq: Every day | ORAL | Status: DC
  Administered 2021-02-17 – 2021-02-19 (×3): 1 via ORAL
  Filled 2021-02-17 (×3): qty 1

## 2021-02-17 MED ORDER — ENSURE ENLIVE PO LIQD
237.0000 mL | Freq: Two times a day (BID) | ORAL | Status: DC
  Administered 2021-02-17 – 2021-02-19 (×4): 237 mL via ORAL

## 2021-02-17 NOTE — Progress Notes (Signed)
Progress Note  Patient Name: Mark Clayton Date of Encounter: 02/17/2021  [place] [facility] Cardiologist: Candee Furbish, MD   Subjective   Breathing is OK  No CP   Inpatient Medications    Scheduled Meds:  atorvastatin  80 mg Oral QHS   carvedilol  3.125 mg Oral BID WC   divalproex  500 mg Oral QHS   ferrous sulfate  325 mg Oral Q breakfast   pantoprazole  40 mg Oral Daily   potassium chloride  40 mEq Oral BID   rivaroxaban  15 mg Oral Q supper   senna-docusate  2 tablet Oral QHS   sodium chloride flush  3 mL Intravenous Q12H   traZODone  100 mg Oral QHS   Continuous Infusions:  sodium chloride     furosemide (LASIX) 200 mg in dextrose 5% 100 mL (2mg /mL) infusion 8 mg/hr (02/16/21 1112)   PRN Meds: sodium chloride, acetaminophen, bisacodyl, polyethylene glycol, sodium chloride flush   Vital Signs    Vitals:   02/16/21 1101 02/16/21 2000 02/16/21 2208 02/17/21 0423  BP: (!) 89/60 (!) 83/54 93/63 101/66  Pulse: 78 71 79 74  Resp: 18 17  17   Temp: (!) 97.5 F (36.4 C) 98.2 F (36.8 C)  97.6 F (36.4 C)  TempSrc: Oral Oral  Oral  SpO2: 100% 99%  100%  Weight:    51.2 kg  Height:        Intake/Output Summary (Last 24 hours) at 02/17/2021 0838 Last data filed at 02/17/2021 0300 Gross per 24 hour  Intake 929.4 ml  Output 2400 ml  Net -1470.6 ml   Net neg 18.7 L    Last 3 Weights 02/17/2021 02/16/2021 02/15/2021  Weight (lbs) 112 lb 14 oz 118 lb 2.7 oz 123 lb 14.4 oz  Weight (kg) 51.2 kg 53.6 kg 56.2 kg      Telemetry    Rate contorlled afib, LBBB   Freq PVCs, couplets- Personally Reviewed  ECG    n'a - Personally Reviewed   Echo   02/08/21  eft ventricular ejection fraction, by estimation, is <20%. The left ventricle has severely decreased function. The left ventricle demonstrates global hypokinesis. The left ventricular internal cavity size was moderately dilated. Left ventricular diastolic parameters are consistent with Grade III diastolic  dysfunction (restrictive). 1. Right ventricular systolic function is normal. The right ventricular size is moderately enlarged. 2. 3. Left atrial size was severely dilated. The mitral valve is normal in structure. Trivial mitral valve regurgitation. No evidence of mitral stenosis. 4. The aortic valve is tricuspid. There is moderate calcification of the aortic valve. There is moderate thickening of the aortic valve. Aortic valve regurgitation is trivial. Aortic valve sclerosis/calcification is present, without any evidence of aortic stenosis. 5. The inferior vena cava is dilated in size with <50% respiratory variability, suggesting right atrial pressure of 15 mmHg. 6. Comparison(s): No significant change from prior study. Prior images reviewed side by side. Physical Exam   GEN: Pt is an extremely thin 80 yo in no acute distress.   Neck: JVP increased  Cardiac: irreg irreg  Respiratory: Clear to auscultation bilaterally. GI: Soft, nontender, non-distended  MS:  NO LE bilateral LE edema Neuro:  Nonfocal  Psych: Normal affect   Labs    High Sensitivity Troponin:  No results for input(s): TROPONINIHS in the last 720 hours.   Chemistry Recent Labs  Lab 02/15/21 0238 02/16/21 0245 02/17/21 0221  NA 136 133* 135  K 3.7 4.3 4.6  CL  100 98 97*  CO2 27 26 31   GLUCOSE 96 99 112*  BUN 18 20 26*  CREATININE 1.13 1.19 1.20  CALCIUM 8.5* 8.9 9.0  MG 2.1  --   --   GFRNONAA >60 >60 >60  ANIONGAP 9 9 7     Lipids No results for input(s): CHOL, TRIG, HDL, LABVLDL, LDLCALC, CHOLHDL in the last 168 hours.  Hematology Recent Labs  Lab 02/17/21 0221  WBC 5.2  RBC 4.30  HGB 13.1  HCT 41.1  MCV 95.6  MCH 30.5  MCHC 31.9  RDW 18.9*  PLT 188   Thyroid No results for input(s): TSH, FREET4 in the last 168 hours.  BNP Recent Labs  Lab 02/17/21 0221  BNP 1,487.5*    DDimer No results for input(s): DDIMER in the last 168 hours.   Radiology    No results found.  Cardiac  Studies     Patient Profile     80 y.o. male with a hx of (HFrEF) heart failure with reduced ejection fraction due to ischemic CM with EF < 20, coronary artery disease s/p CABG in 2003, DES to the native RCA and DES to S-RPDA (jump graft to RPL chronically occluded), permanent atrial fibrillation on chronic anticoagulation, VT, s/p ICD, prior LV mural thrombus resolved on f/u studies, chronic kidney disease, DNR, hypertension, hyperlipidemia here for congestive heart failure.  Assessment & Plan    1.Acute on chronic systolic HF/ History of ICM - 01/2021 echo <20%, grade III dd, normal RV function, severe LAE - 05/2019 echo LVEF < 20%   - on lasix gtt at 10mg /hr. Pt has diuresed close to 20 L  VOlume may be minimally up      - medical therapy with coreg 3.125mg  bid. Medical therapyl limited by low bp's.  - remains fluid overloaded though much improved, Would stop this afternoon  Transition to PO lasix tomorrow     Key will be dose    I would try 60 daily  with 20 kCL      2.CAD -  CABG 2003 stent to RCA in 2016 and stent to SVG vein graft in 2020.  - no  angina     3. Permanent afib - rate controlled on coreg, he is on xarelto for stroke prevention.  4  PT   Pt refused PT yesterday      For questions or updates, please contact [place] Please consult [email or web address] for contact info under        Signed, Dorris Carnes, MD  02/17/2021, 8:38 AM

## 2021-02-17 NOTE — Progress Notes (Signed)
Heart Failure Nurse Navigator Progress Note  Spoke with patient and called sister Lelon Frohlich) with questions and scheduling regarding HV TOC appt. Cancelled appt scheduled 11/22 as pt is still hospitalized. Sister states she wants to get patient home and settled, determine if they are able to get him in and out of car before rescheduling HV TOC appt. Explained plan for appt within 7-10 post hospitalization for optimization and medication cost assistance. Pt very deconditioned, sister states "it took all he had to sit up for lunch today". Pt has private home care assists that come for 8 hours per day. Gave sister number to [place] [facility] for scheduling purposes. Sister hopeful for DC tomorrow.   Pricilla Holm, MSN, RN Heart Failure Nurse Navigator [phone]

## 2021-02-17 NOTE — Progress Notes (Signed)
Physical Therapy Treatment Patient Details Name: Mark Clayton MRN: [id number] DOB: 1940/06/09 Today's Date: 02/17/2021   History of Present Illness Pt is an 80 y.o. male who presented 02/07/21 with lower extremity edema and swelling into his abdomen along with malaise, cough, orthopnea, and wheezing. Chest x-ray noted cardiomegaly with small bilateral pleural effusions. Pt admitted with acute on chronic systolic congestive heart failure. PMH: HTN, HLD, PAF on chronic anticoagulation, s/p AICD, LV thrombus, ICM, CAD s/p CABG, CVA, colon cancer s/p sigmoid colon resection    PT Comments    The pt was agreeable to mobility this afternoon despite continues reports of fatigue and weakness at this time. He continues to demo poor power in BLE that requires minA to power up when no access to hand rails for UE support. The pt was then able to complete ~20 ft ambulation in the room with heavy reliance on BUE support through RW, but does shuffle RLE due to hx of CVA and residual weakness. The pt reports he is aware of significant deficits in strength and endurance due to continued immobility, and is hopeful to regain strength and mobility through HHPT. The pt also reports plans to d/c home tomorrow, will benefit from skilled PT as well as needing increased family assist for all OOB mobility at this time due to deficits in power and endurance.    Recommendations for follow up therapy are one component of a multi-disciplinary discharge planning process, led by the attending physician.  Recommendations may be updated based on patient status, additional functional criteria and insurance authorization.  Follow Up Recommendations  Home health PT     Assistance Recommended at Discharge Frequent or constant Supervision/Assistance  Equipment Recommendations  None recommended by PT    Recommendations for Other Services       Precautions / Restrictions Precautions Precautions: Fall Precaution Comments: RLE  weakness at baseline from prior CVA Restrictions Weight Bearing Restrictions: No     Mobility  Bed Mobility Overal bed mobility: Needs Assistance Bed Mobility: Supine to Sit;Sit to Supine     Supine to sit: Min assist Sit to supine: Min assist   General bed mobility comments: minA to elevate trunk , then minA to return RLE to bed. pt able to use bed rails well and states he has hospital bed at home    Transfers Overall transfer level: Needs assistance Equipment used: Rolling walker (2 wheels) Transfers: Sit to/from Stand Sit to Stand: Min assist;Min guard           General transfer comment: minA with on low surface with no hand rails, minG from surface with hand rails. x4 in session    Ambulation/Gait Ambulation/Gait assistance: Min guard Gait Distance (Feet): 20 Feet Assistive device: Rolling walker (2 wheels) Gait Pattern/deviations: Step-through pattern;Decreased stride length;Trunk flexed;Decreased step length - right;Decreased dorsiflexion - right;Knee hyperextension - right Gait velocity: reduced Gait velocity interpretation: <1.31 ft/sec, indicative of household ambulator   General Gait Details: Pt with slow gait, dragging R foot with report of hx of weakness from prior CVA, no R foot clearance off ground even with cues including tactile cues during swing. Pt required min guard-minA for stability and blocking of R knee in stance to prevent noted hyperextension. Fatigues quickly, standing to rest a couple times each bout.      Balance Overall balance assessment: Needs assistance Sitting-balance support: No upper extremity supported;Feet supported Sitting balance-Leahy Scale: Good     Standing balance support: Reliant on assistive device for balance Standing  balance-Leahy Scale: Poor Standing balance comment: Reliant on RW                            Cognition Arousal/Alertness: Awake/alert Behavior During Therapy: WFL for tasks  assessed/performed Overall Cognitive Status: No family/caregiver present to determine baseline cognitive functioning                                 General Comments: Pt with slow processing, but also HOH. Pt with some STM deficits.        Exercises Other Exercises Other Exercises: sit-stand from EOB x3 in a row. pt with poor wt shift to RLE, needing heavy use of UE to power up    General Comments General comments (skin integrity, edema, etc.): VSS on RA      Pertinent Vitals/Pain Pain Assessment: Faces Faces Pain Scale: Hurts a little bit Pain Location: bil knees Pain Descriptors / Indicators: Guarding Pain Intervention(s): Limited activity within patient's tolerance;Monitored during session;Repositioned     PT Goals (current goals can now be found in the care plan section) Acute Rehab PT Goals Patient Stated Goal: to go home soon PT Goal Formulation: With patient Time For Goal Achievement: 02/23/21 Potential to Achieve Goals: Fair Progress towards PT goals: Progressing toward goals    Frequency    Min 3X/week      PT Plan Current plan remains appropriate       AM-PAC PT "6 Clicks" Mobility   Outcome Measure  Help needed turning from your back to your side while in a flat bed without using bedrails?: A Little Help needed moving from lying on your back to sitting on the side of a flat bed without using bedrails?: A Little Help needed moving to and from a bed to a chair (including a wheelchair)?: A Lot Help needed standing up from a chair using your arms (e.g., wheelchair or bedside chair)?: A Lot Help needed to walk in hospital room?: A Little Help needed climbing 3-5 steps with a railing? : A Lot 6 Click Score: 15    End of Session Equipment Utilized During Treatment: Gait belt Activity Tolerance: Patient tolerated treatment well Patient left: in bed;with call bell/phone within reach;with bed alarm set Nurse Communication: Mobility status PT  Visit Diagnosis: Unsteadiness on feet (R26.81);Other abnormalities of gait and mobility (R26.89);Muscle weakness (generalized) (M62.81);Difficulty in walking, not elsewhere classified (R26.2)     Time: 6045-4098 PT Time Calculation (min) (ACUTE ONLY): 35 min  Charges:  $Gait Training: 8-22 mins $Therapeutic Activity: 8-22 mins                     West Carbo, PT, DPT   Acute Rehabilitation Department Pager #: [phone]   Sandra Cockayne 02/17/2021, 4:49 PM

## 2021-02-17 NOTE — Progress Notes (Signed)
PROGRESS NOTE    Mark Clayton  VOH:[id number] DOB: 06-Oct-1940 DOA: 02/07/2021 PCP: [name], MD   Brief Narrative:  Mark Clayton is a 80 y.o. male with medical history significant of HTN, HLD, PAF on chronic anticoagulation, s/p AICD, LV thrombus, ICM, CAD s/p CABG, CVA, colon cancer s/p sigmoid colon resection who presented with complaints of progressively worsening swelling over the last 2 -3 weeks.  Has diuresed and plan to switch to PO lasix in AM    Assessment & Plan:   Principal Problem:   Acute on chronic systolic CHF (congestive heart failure) (HCC) Active Problems:   Hyperlipidemia   Cardiac defibrillator  MDT VVI   Persistent atrial fibrillation   Coronary artery disease involving native heart with angina pectoris [facility])   Essential hypertension   Chronic anticoagulation   CHF (congestive heart failure) (HCC)  Acute on chronic combined systolic and diastolic congestive heart failure: BNP of 2099.8.  Chest x-ray noted cardiomegaly with small bilateral pleural effusions.  Last echocardiogram noted EF of less than 20% with global hypokinesis in 05/2019 and grade 2 diastolic dysfunction.  -lasix gtt per cards  Permanent atrial fibrillation on chronic anticoagulation: Rate controlled.  Continue Xarelto and Coreg   Essential hypertension: Blood pressure soft intermittently.  Continue home Coreg 3.125 mg p.o. twice daily with holding parameters  CAD s/p CABG: Last cardiac cath from 11/22/2018 noted multivessel coronary artery disease with successful PCI to SVG to RPDA with a drug-eluting stent.  Asymptomatic.   History of VT ischemic cardiomyopathy s/p AICD  Hypokalemia:  -replete   Hyponatremia: Acute.  Resolved.   HA -lasix gtt stopped -? Over diuresis -in on blood thinner so if continues, will need head CT  Chronic kidney disease stage IIIa: Creatinine has been stable   Hyperlipidemia -Continue atorvastatin   GERD -Continue Protonix   DVT  prophylaxis:    Code Status: DNR  Family Communication:  None present at bedside.  Discussed with patient. Status is: Inpatient  Remains inpatient appropriate because: home in AM   Consultants:  Cardiology      Subjective: C/o headache  Objective: Vitals:   02/16/21 1101 02/16/21 2000 02/16/21 2208 02/17/21 0423  BP: (!) 89/60 (!) 83/54 93/63 101/66  Pulse: 78 71 79 74  Resp: 18 17  17   Temp: (!) 97.5 F (36.4 C) 98.2 F (36.8 C)  97.6 F (36.4 C)  TempSrc: Oral Oral  Oral  SpO2: 100% 99%  100%  Weight:    51.2 kg  Height:        Intake/Output Summary (Last 24 hours) at 02/17/2021 1024 Last data filed at 02/17/2021 0855 Gross per 24 hour  Intake 804.91 ml  Output 2950 ml  Net -2145.09 ml   Filed Weights   02/15/21 0433 02/16/21 0447 02/17/21 0423  Weight: 56.2 kg 53.6 kg 51.2 kg    Examination:   General: Appearance:    Thin/frail male in no acute distress     Lungs:     respirations unlabored  Heart:    Normal heart rate. Irregularly irregular rhythm.   MS:   All extremities are intact.    Neurologic:   Awake, alert, hard of hearing         Data Reviewed: I have personally reviewed following labs and imaging studies  CBC: Recent Labs  Lab 02/17/21 0221  WBC 5.2  HGB 13.1  HCT 41.1  MCV 95.6  PLT 352    Basic Metabolic Panel: Recent Labs  Lab 02/13/21 0151 02/14/21 0224 02/15/21 0238 02/16/21 0245 02/17/21 0221  NA 136 134* 136 133* 135  K 4.0 3.3* 3.7 4.3 4.6  CL 100 99 100 98 97*  CO2 28 26 27 26 31   GLUCOSE 87 99 96 99 112*  BUN 26* 22 18 20  26*  CREATININE 1.37* 1.30* 1.13 1.19 1.20  CALCIUM 8.7* 8.4* 8.5* 8.9 9.0  MG  --   --  2.1  --   --    GFR: Estimated Creatinine Clearance: 35.6 mL/min (by C-G formula based on SCr of 1.2 mg/dL). Liver Function Tests: No results for input(s): AST, ALT, ALKPHOS, BILITOT, PROT, ALBUMIN in the last 168 hours. No results for input(s): LIPASE, AMYLASE in the last 168 hours. No results  for input(s): AMMONIA in the last 168 hours. Coagulation Profile: No results for input(s): INR, PROTIME in the last 168 hours. Cardiac Enzymes: No results for input(s): CKTOTAL, CKMB, CKMBINDEX, TROPONINI in the last 168 hours. BNP (last 3 results) Recent Labs    01/27/21 1144  PROBNP 1,995.0*   HbA1C: No results for input(s): HGBA1C in the last 72 hours. CBG: No results for input(s): GLUCAP in the last 168 hours. Lipid Profile: No results for input(s): CHOL, HDL, LDLCALC, TRIG, CHOLHDL, LDLDIRECT in the last 72 hours. Thyroid Function Tests: No results for input(s): TSH, T4TOTAL, FREET4, T3FREE, THYROIDAB in the last 72 hours. Anemia Panel: No results for input(s): VITAMINB12, FOLATE, FERRITIN, TIBC, IRON, RETICCTPCT in the last 72 hours. Sepsis Labs: No results for input(s): PROCALCITON, LATICACIDVEN in the last 168 hours.  Recent Results (from the past 240 hour(s))  Resp Panel by RT-PCR (Flu A&B, Covid) Nasopharyngeal Swab     Status: None   Collection Time: 02/07/21 10:28 PM   Specimen: Nasopharyngeal Swab; Nasopharyngeal(NP) swabs in vial transport medium  Result Value Ref Range Status   SARS Coronavirus 2 by RT PCR NEGATIVE NEGATIVE Final    Comment: (NOTE) SARS-CoV-2 target nucleic acids are NOT DETECTED.  The SARS-CoV-2 RNA is generally detectable in upper respiratory specimens during the acute phase of infection. The lowest concentration of SARS-CoV-2 viral copies this assay can detect is 138 copies/mL. A negative result does not preclude SARS-Cov-2 infection and should not be used as the sole basis for treatment or other patient management decisions. A negative result may occur with  improper specimen collection/handling, submission of specimen other than nasopharyngeal swab, presence of viral mutation(s) within the areas targeted by this assay, and inadequate number of viral copies(<138 copies/mL). A negative result must be combined with clinical observations,  patient history, and epidemiological information. The expected result is Negative.  Fact Sheet for Patients:  EntrepreneurPulse.com.au  Fact Sheet for Healthcare Providers:  IncredibleEmployment.be  This test is no t yet approved or cleared by the [place] FDA and  has been authorized for detection and/or diagnosis of SARS-CoV-2 by FDA under an Emergency Use Authorization (EUA). This EUA will remain  in effect (meaning this test can be used) for the duration of the COVID-19 declaration under Section 564(b)(1) of the Act, 21 U.S.C.section 360bbb-3(b)(1), unless the authorization is terminated  or revoked sooner.       Influenza A by PCR NEGATIVE NEGATIVE Final   Influenza B by PCR NEGATIVE NEGATIVE Final    Comment: (NOTE) The Xpert Xpress SARS-CoV-2/FLU/RSV plus assay is intended as an aid in the diagnosis of influenza from Nasopharyngeal swab specimens and should not be used as a sole basis for treatment. Nasal washings and aspirates are  unacceptable for Xpert Xpress SARS-CoV-2/FLU/RSV testing.  Fact Sheet for Patients: EntrepreneurPulse.com.au  Fact Sheet for Healthcare Providers: IncredibleEmployment.be  This test is not yet approved or cleared by the [place] FDA and has been authorized for detection and/or diagnosis of SARS-CoV-2 by FDA under an Emergency Use Authorization (EUA). This EUA will remain in effect (meaning this test can be used) for the duration of the COVID-19 declaration under Section 564(b)(1) of the Act, 21 U.S.C. section 360bbb-3(b)(1), unless the authorization is terminated or revoked.  Performed at [facility], [place] [address] [place], [place] [address]        Radiology Studies: No results found.  Scheduled Meds:  atorvastatin  80 mg Oral QHS   carvedilol  3.125 mg Oral BID WC   divalproex  500 mg Oral QHS   ferrous sulfate  325 mg Oral Q breakfast    [START ON 02/18/2021] furosemide  60 mg Oral Daily   pantoprazole  40 mg Oral Daily   potassium chloride  40 mEq Oral BID   rivaroxaban  15 mg Oral Q supper   senna-docusate  2 tablet Oral QHS   sodium chloride flush  3 mL Intravenous Q12H   traZODone  100 mg Oral QHS   Continuous Infusions:  sodium chloride       LOS: 9 days   Time spent: 27 minutes   Geradine Girt, DO [facility]  02/17/2021, 10:24 AM  Please page via [place] and do not message via secure chat for anything urgent. Secure chat can be used for anything non urgent.  How to contact the [place] Attending or Consulting provider [place] or covering provider during after hours [place], for this patient?  Check the care team in [facility] and look for a) attending/consulting [facility] provider listed and b) the [facility] team listed. Page or secure chat [phone]. Log into [email or web address] and use [place]'s universal password to access. If you do not have the password, please contact the hospital operator. Locate the [facility] provider you are looking for under [facility] and page to a number that you can be directly reached. If you still have difficulty reaching the provider, please page the [facility] (Director on Call) for the Hospitalists listed on amion for assistance.

## 2021-02-17 NOTE — Progress Notes (Signed)
Initial Nutrition Assessment  DOCUMENTATION CODES:   Underweight  INTERVENTION:   -Ensure Enlive po BID, each supplement provides 350 kcal and 20 grams of protein  -MVI with minerals daily -Double protein portions with meals -Liberalize diet to 2 gram sodium  NUTRITION DIAGNOSIS:   Increased nutrient needs related to chronic illness (CHF) as evidenced by estimated needs.  GOAL:   Patient will meet greater than or equal to 90% of their needs  MONITOR:   PO intake, Supplement acceptance, Labs, Weight trends, Skin, I & O's  REASON FOR ASSESSMENT:   Consult Assessment of nutrition requirement/status, Poor PO  ASSESSMENT:   Mark Clayton is a 80 y.o. male with medical history significant of HTN, HLD, PAF on chronic anticoagulation, s/p AICD, LV thrombus, ICM, CAD s/p CABG, CVA, colon cancer s/p sigmoid colon resection who presents with complaints of progressively worsening swelling over the last 2 -3 weeks.  Normally patient ambulates with the use of a rolling walker.  He had been having difficulty getting around at home due to symptoms.  Noted swelling all the way into his abdomen which was causing him discomfort.  Patient was seen last by his primary care provider on 10/31 due to symptoms.  He had increased his dose of Lasix from 40 to 60 mg daily for at least the last 2 weeks, but had not noticed any significant change.  Associated symptoms included orthopnea, wheezing(reported by daughter), nonproductive cough, and malaise.  Denies having any significant fever, chest pain, palpitations, shocks from AICD, or blood in stools.  Pt admitted with CHF.   Reviewed I/O's: -2.1 L x 24 hours and -18.7 L since admission  UOP: 3 L x 24 hours   Pt unavailable at time of visit. Attempted to speak with pt via call to hospital room phone, however, unable to reach. RD unable to obtain further nutrition-related history or complete nutrition-focused physical exam at this time.    Pt with fair  to good oral intake. Noted meal completions 50-100%.   Reviewed wt hx; pt has experienced a 17% wt loss over the past 3 months, which is significant for time frame.   Highly suspect pt with malnutrition, however, unable to identify at this time. Pt would greatly benefit from addition of oral nutrition supplements.   Case discussed with Dr. Eliseo Squires regarding diet liberalization; received permission to liberalize diet to 2 gram sodium diet.    Medications reviewed and include ferrous sulfate, lasix, potassium chloride, and senokot.  Labs reviewed.   Diet Order:   Diet Order             Diet 2 gram sodium Room service appropriate? Yes; Fluid consistency: Thin; Fluid restriction: 1500 mL Fluid  Diet effective now                   EDUCATION NEEDS:   No education needs have been identified at this time  Skin:  Skin Assessment: Skin Integrity Issues: Skin Integrity Issues:: Other (Comment) Other: MASD to buttocks  Last BM:  02/16/21  Height:   Ht Readings from Last 1 Encounters:  02/08/21 5\' 11"  (1.803 m)    Weight:   Wt Readings from Last 1 Encounters:  02/17/21 51.2 kg    Ideal Body Weight:  78.2 kg  BMI:  Body mass index is 15.74 kg/m.  Estimated Nutritional Needs:   Kcal:  2050-2250  Protein:  105-120 grams  Fluid:  > 2 L    Loistine Chance, RD, LDN, CDCES  Registered Dietitian II Certified Diabetes Care and Education Specialist Please refer to [facility] for RD and/or RD on-call/weekend/after hours pager

## 2021-02-17 NOTE — Care Management Important Message (Signed)
Important Message  Patient Details  Name: Mark Clayton MRN: [id number] Date of Birth: [birth year]   Medicare Important Message Given:  Yes     [name] 02/17/2021, 9:39 AM

## 2021-02-18 ENCOUNTER — Encounter (HOSPITAL_COMMUNITY): Payer: Medicare Other

## 2021-02-18 DIAGNOSIS — I5023 Acute on chronic systolic (congestive) heart failure: Secondary | ICD-10-CM | POA: Diagnosis not present

## 2021-02-18 LAB — BASIC METABOLIC PANEL
Anion gap: 9 (ref 5–15)
BUN: 32 mg/dL — ABNORMAL HIGH (ref 8–23)
CO2: 28 mmol/L (ref 22–32)
Calcium: 9.2 mg/dL (ref 8.9–10.3)
Chloride: 98 mmol/L (ref 98–111)
Creatinine, Ser: 1.32 mg/dL — ABNORMAL HIGH (ref 0.61–1.24)
GFR, Estimated: 55 mL/min — ABNORMAL LOW (ref >60)
Glucose, Bld: 93 mg/dL (ref 70–99)
Potassium: 5.5 mmol/L — ABNORMAL HIGH (ref 3.5–5.1)
Sodium: 135 mmol/L (ref 135–145)

## 2021-02-18 MED ORDER — FUROSEMIDE 40 MG PO TABS
60.0000 mg | ORAL_TABLET | Freq: Every day | ORAL | Status: DC
  Administered 2021-02-19: 60 mg via ORAL
  Filled 2021-02-18: qty 1

## 2021-02-18 MED ORDER — SODIUM ZIRCONIUM CYCLOSILICATE 5 G PO PACK
5.0000 g | PACK | Freq: Once | ORAL | Status: AC
  Administered 2021-02-18: 5 g via ORAL
  Filled 2021-02-18: qty 1

## 2021-02-18 NOTE — Progress Notes (Signed)
Progress Note  Patient Name: Mark Clayton Date of Encounter: 02/18/2021  [facility] Cardiologist: Candee Furbish, MD   Subjective   Patient is breathng OK  Denies CP   Inpatient Medications    Scheduled Meds:  atorvastatin  80 mg Oral QHS   carvedilol  3.125 mg Oral BID WC   divalproex  500 mg Oral QHS   feeding supplement  237 mL Oral BID BM   ferrous sulfate  325 mg Oral Q breakfast   furosemide  60 mg Oral Daily   multivitamin with minerals  1 tablet Oral Daily   pantoprazole  40 mg Oral Daily   potassium chloride  40 mEq Oral BID   rivaroxaban  15 mg Oral Q supper   senna-docusate  2 tablet Oral QHS   sodium chloride flush  3 mL Intravenous Q12H   traZODone  100 mg Oral QHS   Continuous Infusions:  sodium chloride     PRN Meds: sodium chloride, acetaminophen, bisacodyl, polyethylene glycol, sodium chloride flush   Vital Signs    Vitals:   02/17/21 0423 02/17/21 1124 02/17/21 1926 02/18/21 0323  BP: 101/66 (!) 97/53 (!) 96/57 (!) 87/55  Pulse: 74 63 (!) 54 75  Resp: 17 18 19 16   Temp: 97.6 F (36.4 C) (!) 97.4 F (36.3 C) 97.7 F (36.5 C) (!) 97.3 F (36.3 C)  TempSrc: Oral Oral Oral Oral  SpO2: 100% 100% 96% 98%  Weight: 51.2 kg   51.8 kg  Height:        Intake/Output Summary (Last 24 hours) at 02/18/2021 0932 Last data filed at 02/17/2021 2100 Gross per 24 hour  Intake 243 ml  Output 550 ml  Net -307 ml   Net neg 19 L    Last 3 Weights 02/18/2021 02/17/2021 02/16/2021  Weight (lbs) 114 lb 3.2 oz 112 lb 14 oz 118 lb 2.7 oz  Weight (kg) 51.8 kg 51.2 kg 53.6 kg      Telemetry    Afib   PVCs   Personally Reviewed  ECG    n'a - Personally Reviewed   Echo   02/08/21  eft ventricular ejection fraction, by estimation, is <20%. The left ventricle has severely decreased function. The left ventricle demonstrates global hypokinesis. The left ventricular internal cavity size was moderately dilated. Left ventricular diastolic parameters are  consistent with Grade III diastolic dysfunction (restrictive). 1. Right ventricular systolic function is normal. The right ventricular size is moderately enlarged. 2. 3. Left atrial size was severely dilated. The mitral valve is normal in structure. Trivial mitral valve regurgitation. No evidence of mitral stenosis. 4. The aortic valve is tricuspid. There is moderate calcification of the aortic valve. There is moderate thickening of the aortic valve. Aortic valve regurgitation is trivial. Aortic valve sclerosis/calcification is present, without any evidence of aortic stenosis. 5. The inferior vena cava is dilated in size with <50% respiratory variability, suggesting right atrial pressure of 15 mmHg. 6. Comparison(s): No significant change from prior study. Prior images reviewed side by side. Physical Exam   GEN: Pt is an extremely thin 79 yo in no acute distress.   Neck: JVP increased  Cardiac: irreg irreg  Respiratory: Clear to auscultation bilaterally. GI: Soft, nontender, non-distended  MS:  NO LE bilateral LE edema Neuro:  Nonfocal  Psych: Normal affect   Labs    High Sensitivity Troponin:  No results for input(s): TROPONINIHS in the last 720 hours.   Chemistry Recent Labs  Lab 02/15/21 [phone]  02/16/21 0245 02/17/21 0221 02/18/21 0255  NA 136 133* 135 135  K 3.7 4.3 4.6 5.5*  CL 100 98 97* 98  CO2 27 26 31 28   GLUCOSE 96 99 112* 93  BUN 18 20 26* 32*  CREATININE 1.13 1.19 1.20 1.32*  CALCIUM 8.5* 8.9 9.0 9.2  MG 2.1  --   --   --   GFRNONAA >60 >60 >60 55*  ANIONGAP 9 9 7 9     Lipids No results for input(s): CHOL, TRIG, HDL, LABVLDL, LDLCALC, CHOLHDL in the last 168 hours.  Hematology Recent Labs  Lab 02/17/21 0221  WBC 5.2  RBC 4.30  HGB 13.1  HCT 41.1  MCV 95.6  MCH 30.5  MCHC 31.9  RDW 18.9*  PLT 188   Thyroid No results for input(s): TSH, FREET4 in the last 168 hours.  BNP Recent Labs  Lab 02/17/21 0221  BNP 1,487.5*    DDimer No results  for input(s): DDIMER in the last 168 hours.   Radiology    No results found.  Cardiac Studies     Patient Profile     80 y.o. male with a hx of (HFrEF) heart failure with reduced ejection fraction due to ischemic CM with EF < 20, coronary artery disease s/p CABG in 2003, DES to the native RCA and DES to S-RPDA (jump graft to RPL chronically occluded), permanent atrial fibrillation on chronic anticoagulation, VT, s/p ICD, prior LV mural thrombus resolved on f/u studies, chronic kidney disease, DNR, hypertension, hyperlipidemia here for congestive heart failure.  Assessment & Plan    1.Acute on chronic systolic HF/ History of ICM - 01/2021 echo <20%, grade III dd, normal RV function, severe LAE - 05/2019 echo LVEF < 20% -Patient diuresed 20 L with lasix gtt    -switched to PO today -continue medical Rx   BP soft   On carvedilol 3.124 - Will need close follow up of volume status and Cr     2.CAD -  CABG 2003 stent to RCA in 2016 and stent to SVG vein graft in 2020.  - no  anginal symptoms     3. Permanent afib - rate controlled on coreg, he is on xarelto for stroke prevention.  4  PT        From cardiac standpoint OK to d/c  We'll make sure he has follow up in clnc     Not out of bed.    For questions or updates, please contact [place] Please consult [email or web address] for contact info under        Signed, Dorris Carnes, MD  02/18/2021, 6:19 AM

## 2021-02-18 NOTE — Progress Notes (Signed)
Mobility Specialist Progress Note:   02/18/21 1035  Mobility  Activity Transferred to/from [facility]  Level of Assistance Minimal assist, patient does 75% or more  Assistive Device Other (Comment) (HHA)  Mobility Out of bed for toileting  Mobility Response Tolerated well  Mobility performed by Mobility specialist  $Mobility charge 1 Mobility   Pt requesting to use BSC. PT arrived to work with pt. Will follow up later for more ambulation.   [facility] Public librarian Phone [phone] Secondary Phone [phone]

## 2021-02-18 NOTE — Discharge Instructions (Signed)
Information on my medicine - XARELTO® (Rivaroxaban) ° °Why was Xarelto® prescribed for you? °Xarelto® was prescribed for you to reduce the risk of a blood clot forming that can cause a stroke if you have a medical condition called atrial fibrillation (a type of irregular heartbeat). ° °What do you need to know about xarelto® ? °Take your Xarelto® ONCE DAILY at the same time every day with your evening meal. °If you have difficulty swallowing the tablet whole, you may crush it and mix in applesauce just prior to taking your dose. ° °Take Xarelto® exactly as prescribed by your doctor and DO NOT stop taking Xarelto® without talking to the doctor who prescribed the medication.  Stopping without other stroke prevention medication to take the place of Xarelto® may increase your risk of developing a clot that causes a stroke.  Refill your prescription before you run out. ° °After discharge, you should have regular check-up appointments with your healthcare provider that is prescribing your Xarelto®.  In the future your dose may need to be changed if your kidney function or weight changes by a significant amount. ° °What do you do if you miss a dose? °If you are taking Xarelto® ONCE DAILY and you miss a dose, take it as soon as you remember on the same day then continue your regularly scheduled once daily regimen the next day. Do not take two doses of Xarelto® at the same time or on the same day.  ° °Important Safety Information °A possible side effect of Xarelto® is bleeding. You should call your healthcare provider right away if you experience any of the following: °? Bleeding from an injury or your nose that does not stop. °? Unusual colored urine (red or dark [place]) or unusual colored stools (red or black). °? Unusual bruising for unknown reasons. °? A serious fall or if you hit your head (even if there is no bleeding). ° °Some medicines may interact with Xarelto® and might increase your risk of bleeding while on  Xarelto®. To help avoid this, consult your healthcare provider or pharmacist prior to using any new prescription or non-prescription medications, including herbals, vitamins, non-steroidal anti-inflammatory drugs (NSAIDs) and supplements. ° °This website has more information on Xarelto®: www.xarelto.com. ° °

## 2021-02-18 NOTE — TOC Progression Note (Signed)
Transition of Care [facility]) - Progression Note    Patient Details  Name: DEZMIN KITTELSON MRN: [id number] Date of Birth: Mar 17, 1941  [facility] CM/SW Contact  Zenon Mayo, RN Phone Number: 02/18/2021, 3:50 PM  Clinical Narrative:    Patient for dc tomorrow, daughter would like for [facility] pharmacy to fill meds here for patient. The caregiver will be at the patient's home tomorrow per daughter.    Expected Discharge Plan: [place] Barriers to Discharge: Continued Medical Work up  Expected Discharge Plan and Services Expected Discharge Plan: [place] In-house Referral: NA Discharge Planning Services: CM Consult Post Acute Care Choice: Home Health, Resumption of Svcs/PTA Provider Living arrangements for the past 2 months: Single Family Home                   DME Agency: NA       HH Arranged: RN, Disease Management, PT HH Agency: [place] Date HH Agency Contacted: 02/09/21 Time [place]: 1536 Representative spoke with at [place]: [place]   Social Determinants of Health (SDOH) Interventions Financial Strain Interventions: Other (Comment) (HF pharmacy to see) Housing Interventions: Intervention Not Indicated Transportation Interventions: Intervention Not Indicated  Readmission Risk Interventions No flowsheet data found.

## 2021-02-18 NOTE — Progress Notes (Signed)
MD informed of asymptomatic hypotension. Given ejection fraction and patient's normally soft, blood pressure, received order to monitor for deterioration.

## 2021-02-18 NOTE — Progress Notes (Signed)
PROGRESS NOTE    [name]  HDQ:[id number] DOB: 1940-08-21 DOA: 02/07/2021 PCP: [name], MD   Brief Narrative:  Mark Clayton is a 80 y.o. male with medical history significant of HTN, HLD, PAF on chronic anticoagulation, s/p AICD, LV thrombus, ICM, CAD s/p CABG, CVA, colon cancer s/p sigmoid colon resection who presented with complaints of progressively worsening swelling over the last 2 -3 weeks.  Has diuresed and plan to switch to PO lasix in AM but appears to have been over-diuresed and now slightly orthostatic.  Lasix held today and hope for d/c home in the AM when family has arranged care.      Assessment & Plan:   Principal Problem:   Acute on chronic systolic CHF (congestive heart failure) (HCC) Active Problems:   Hyperlipidemia   Cardiac defibrillator  MDT VVI   Persistent atrial fibrillation   Coronary artery disease involving native heart with angina pectoris [facility])   Essential hypertension   Chronic anticoagulation   CHF (congestive heart failure) (HCC)  Acute on chronic combined systolic and diastolic congestive heart failure: BNP of 2099.8.  Chest x-ray noted cardiomegaly with small bilateral pleural effusions.  Last echocardiogram noted EF of less than 20% with global hypokinesis in 05/2019 and grade 2 diastolic dysfunction.  -lasix held for today due to hypotension and over-diuresis  Permanent atrial fibrillation on chronic anticoagulation: Rate controlled.  Continue Xarelto and Coreg   Essential hypertension: Blood pressure soft intermittently.  Continue home Coreg 3.125 mg p.o. twice daily with holding parameters  CAD s/p CABG: Last cardiac cath from 11/22/2018 noted multivessel coronary artery disease with successful PCI to SVG to RPDA with a drug-eluting stent.  Asymptomatic.   History of VT ischemic cardiomyopathy s/p AICD  Hypokalemia:  -replete   Hyponatremia: Acute.  Resolved.   Chronic kidney disease stage IIIa: Creatinine has been  stable with slight upturn today, recheck in AM   Hyperlipidemia -Continue atorvastatin   GERD -Continue Protonix   DVT prophylaxis:    Code Status: DNR  Family Communication:  None present at bedside.  Discussed with patient. Status is: Inpatient  Remains inpatient appropriate because: home in AM once family has set up caregivers as long as stable   Consultants:  Cardiology      Subjective: No complaints  Objective: Vitals:   02/17/21 1124 02/17/21 1926 02/18/21 0323 02/18/21 1136  BP: (!) 97/53 (!) 96/57 (!) 87/55 (!) 114/58  Pulse: 63 (!) 54 75 78  Resp: 18 19 16 18   Temp: (!) 97.4 F (36.3 C) 97.7 F (36.5 C) (!) 97.3 F (36.3 C) 98 F (36.7 C)  TempSrc: Oral Oral Oral Oral  SpO2: 100% 96% 98% 100%  Weight:   51.8 kg   Height:        Intake/Output Summary (Last 24 hours) at 02/18/2021 1301 Last data filed at 02/18/2021 0841 Gross per 24 hour  Intake 540 ml  Output --  Net 540 ml   Filed Weights   02/16/21 0447 02/17/21 0423 02/18/21 0323  Weight: 53.6 kg 51.2 kg 51.8 kg    Examination:   General: Appearance:    Cachectic male in no acute distress     Lungs:      respirations unlabored  Heart:    Normal heart rate. Irregularly irregular rhythm.   MS:   All extremities are intact. No LE edema   Neurologic:   Awake, alert, pleasant and cooperative  Data Reviewed: I have personally reviewed following labs and imaging studies  CBC: Recent Labs  Lab 02/17/21 0221  WBC 5.2  HGB 13.1  HCT 41.1  MCV 95.6  PLT 732    Basic Metabolic Panel: Recent Labs  Lab 02/14/21 0224 02/15/21 0238 02/16/21 0245 02/17/21 0221 02/18/21 0255  NA 134* 136 133* 135 135  K 3.3* 3.7 4.3 4.6 5.5*  CL 99 100 98 97* 98  CO2 26 27 26 31 28   GLUCOSE 99 96 99 112* 93  BUN 22 18 20  26* 32*  CREATININE 1.30* 1.13 1.19 1.20 1.32*  CALCIUM 8.4* 8.5* 8.9 9.0 9.2  MG  --  2.1  --   --   --    GFR: Estimated Creatinine Clearance: 32.7 mL/min (A)  (by C-G formula based on SCr of 1.32 mg/dL (H)). Liver Function Tests: No results for input(s): AST, ALT, ALKPHOS, BILITOT, PROT, ALBUMIN in the last 168 hours. No results for input(s): LIPASE, AMYLASE in the last 168 hours. No results for input(s): AMMONIA in the last 168 hours. Coagulation Profile: No results for input(s): INR, PROTIME in the last 168 hours. Cardiac Enzymes: No results for input(s): CKTOTAL, CKMB, CKMBINDEX, TROPONINI in the last 168 hours. BNP (last 3 results) Recent Labs    01/27/21 1144  PROBNP 1,995.0*   HbA1C: No results for input(s): HGBA1C in the last 72 hours. CBG: No results for input(s): GLUCAP in the last 168 hours. Lipid Profile: No results for input(s): CHOL, HDL, LDLCALC, TRIG, CHOLHDL, LDLDIRECT in the last 72 hours. Thyroid Function Tests: No results for input(s): TSH, T4TOTAL, FREET4, T3FREE, THYROIDAB in the last 72 hours. Anemia Panel: No results for input(s): VITAMINB12, FOLATE, FERRITIN, TIBC, IRON, RETICCTPCT in the last 72 hours. Sepsis Labs: No results for input(s): PROCALCITON, LATICACIDVEN in the last 168 hours.  No results found for this or any previous visit (from the past 240 hour(s)).      Radiology Studies: No results found.  Scheduled Meds:  atorvastatin  80 mg Oral QHS   carvedilol  3.125 mg Oral BID WC   divalproex  500 mg Oral QHS   feeding supplement  237 mL Oral BID BM   ferrous sulfate  325 mg Oral Q breakfast   [START ON 02/19/2021] furosemide  60 mg Oral Daily   multivitamin with minerals  1 tablet Oral Daily   pantoprazole  40 mg Oral Daily   rivaroxaban  15 mg Oral Q supper   senna-docusate  2 tablet Oral QHS   sodium chloride flush  3 mL Intravenous Q12H   traZODone  100 mg Oral QHS   Continuous Infusions:  sodium chloride       LOS: 10 days   Time spent: 23 minutes   Geradine Girt, DO [facility]  02/18/2021, 1:01 PM  Please page via Amion and do not message via secure chat for  anything urgent. Secure chat can be used for anything non urgent.  How to contact the [facility] Attending or Consulting provider [place] or covering provider during after hours [place], for this patient?  Check the care team in [facility] and look for a) attending/consulting TRH provider listed and b) the [facility] team listed. Page or secure chat [phone]. Log into [email or web address] and use [place]'s universal password to access. If you do not have the password, please contact the hospital operator. Locate the [facility] provider you are looking for under [facility] and page to a number that you  can be directly reached. If you still have difficulty reaching the provider, please page the [facility] (Director on Call) for the Hospitalists listed on amion for assistance.

## 2021-02-18 NOTE — Progress Notes (Signed)
OT Cancellation Note  Patient Details Name: JAVEL HERSH MRN: [id number] DOB: [birth year]   Cancelled Treatment:    Reason Eval/Treat Not Completed: Patient declined, no reason specified;Other (comment) Patient refusing out of bed or edge of bed activities stating, "Im tired. I want to be spiffy for when my family comes tomorrow." Therapy will follow up as able.   Corinne Ports E. Yvana Samonte, COTA/L Acute Rehabilitation Services [place] 02/18/2021, 9:49 AM

## 2021-02-18 NOTE — Progress Notes (Signed)
Physical Therapy Treatment Patient Details Name: Mark Clayton MRN: [id number] DOB: September 18, 1940 Today's Date: 02/18/2021   History of Present Illness Pt is an 80 y.o. male who presented 02/07/21 with lower extremity edema and swelling into his abdomen along with malaise, cough, orthopnea, and wheezing. Chest x-ray noted cardiomegaly with small bilateral pleural effusions. Pt admitted with acute on chronic systolic congestive heart failure. PMH: HTN, HLD, PAF on chronic anticoagulation, s/p AICD, LV thrombus, ICM, CAD s/p CABG, CVA, colon cancer s/p sigmoid colon resection    PT Comments    Pt limited by orthostatic hypotension (76/54 sitting post walk and 103/71 reclined) - notified RN. He required cues and increased time for all transfers.  Pt does have support at home - continue plan of care.   Recommendations for follow up therapy are one component of a multi-disciplinary discharge planning process, led by the attending physician.  Recommendations may be updated based on patient status, additional functional criteria and insurance authorization.  Follow Up Recommendations  Home health PT     Assistance Recommended at Discharge Frequent or constant Supervision/Assistance  Equipment Recommendations  None recommended by PT    Recommendations for Other Services       Precautions / Restrictions Precautions Precautions: Fall Precaution Comments: RLE weakness at baseline from prior CVA     Mobility  Bed Mobility Overal bed mobility: Needs Assistance Bed Mobility: Sit to Supine       Sit to supine: Min assist   General bed mobility comments: on BSC at arrival; min A for legs back to bed    Transfers Overall transfer level: Needs assistance Equipment used: Rolling walker (2 wheels) Transfers: Sit to/from Stand Sit to Stand: Min guard           General transfer comment: Sit to stand x 3 during session; min guard for safety; cues for hand placement and increased time to  rise    Ambulation/Gait Ambulation/Gait assistance: Min guard Gait Distance (Feet): 20 Feet (20' then 5' back to bed) Assistive device: Rolling walker (2 wheels) Gait Pattern/deviations: Decreased stride length;Trunk flexed;Decreased step length - right;Decreased dorsiflexion - right;Step-to pattern       General Gait Details: Pt with slow gait, dragging R foot with report of hx of weakness from prior CVA, no R foot clearance off ground even with cues including tactile cues during swing.  Fatigues quickly, standing to rest a couple times each bout. Limited due to dizziness   Stairs             Wheelchair Mobility    Modified Rankin (Stroke Patients Only)       Balance Overall balance assessment: Needs assistance Sitting-balance support: No upper extremity supported;Feet supported Sitting balance-Leahy Scale: Good     Standing balance support: Reliant on assistive device for balance Standing balance-Leahy Scale: Poor Standing balance comment: Reliant on RW                            Cognition Arousal/Alertness: Awake/alert Behavior During Therapy: WFL for tasks assessed/performed Overall Cognitive Status: Within Functional Limits for tasks assessed                                 General Comments: Pt with slow processing, but also HOH. Pt with some STM deficits.        Exercises      General Comments General  comments (skin integrity, edema, etc.): Pt reports dizziness with gait; unable to get BP in standing (dynamap not in room) but was 76/54 in sitting post walk.  Reclined and was 103/71.  Notified RN.  Tried to get pt comfortable in chair with pillows and chair for foot rest but unable to tolerate - returned to bed      Pertinent Vitals/Pain Pain Assessment: Faces Faces Pain Scale: Hurts little more Pain Location: R foot - intermittent/chronic Pain Descriptors / Indicators: Guarding Pain Intervention(s): Limited activity within  patient's tolerance;Monitored during session;Repositioned    Home Living                          Prior Function            PT Goals (current goals can now be found in the care plan section) Progress towards PT goals: Progressing toward goals    Frequency    Min 3X/week      PT Plan Current plan remains appropriate    Co-evaluation              AM-PAC PT "6 Clicks" Mobility   Outcome Measure  Help needed turning from your back to your side while in a flat bed without using bedrails?: A Little Help needed moving from lying on your back to sitting on the side of a flat bed without using bedrails?: A Little Help needed moving to and from a bed to a chair (including a wheelchair)?: A Little Help needed standing up from a chair using your arms (e.g., wheelchair or bedside chair)?: A Little Help needed to walk in hospital room?: A Little Help needed climbing 3-5 steps with a railing? : A Lot 6 Click Score: 17    End of Session Equipment Utilized During Treatment: Gait belt Activity Tolerance: Patient tolerated treatment well Patient left: in bed;with call bell/phone within reach;with bed alarm set Nurse Communication: Mobility status PT Visit Diagnosis: Unsteadiness on feet (R26.81);Other abnormalities of gait and mobility (R26.89);Muscle weakness (generalized) (M62.81);Difficulty in walking, not elsewhere classified (R26.2)     Time: 7793-9030 PT Time Calculation (min) (ACUTE ONLY): 37 min  Charges:  $Gait Training: 8-22 mins $Therapeutic Activity: 8-22 mins                     Abran Richard, PT Acute Rehab Services Pager [phone] [facility] [place] 02/18/2021, 11:41 AM

## 2021-02-19 ENCOUNTER — Other Ambulatory Visit (HOSPITAL_COMMUNITY): Payer: Self-pay

## 2021-02-19 DIAGNOSIS — I5023 Acute on chronic systolic (congestive) heart failure: Secondary | ICD-10-CM | POA: Diagnosis not present

## 2021-02-19 LAB — BASIC METABOLIC PANEL
Anion gap: 8 (ref 5–15)
BUN: 30 mg/dL — ABNORMAL HIGH (ref 8–23)
CO2: 28 mmol/L (ref 22–32)
Calcium: 9.3 mg/dL (ref 8.9–10.3)
Chloride: 99 mmol/L (ref 98–111)
Creatinine, Ser: 1.22 mg/dL (ref 0.61–1.24)
GFR, Estimated: 60 mL/min — ABNORMAL LOW (ref >60)
Glucose, Bld: 138 mg/dL — ABNORMAL HIGH (ref 70–99)
Potassium: 4.4 mmol/L (ref 3.5–5.1)
Sodium: 135 mmol/L (ref 135–145)

## 2021-02-19 MED ORDER — RIVAROXABAN 15 MG PO TABS: 15.0000 mg | ORAL_TABLET | Freq: Every day | ORAL | 0 refills | Status: DC

## 2021-02-19 MED ORDER — FUROSEMIDE 20 MG PO TABS
60.0000 mg | ORAL_TABLET | Freq: Every day | ORAL | 0 refills | Status: DC
  Filled 2021-02-19: qty 90, 30d supply, fill #0

## 2021-02-19 NOTE — Discharge Summary (Addendum)
Physician Discharge Summary  Mark Clayton LGX:[id number] DOB: [birth year] DOA: 02/07/2021  PCP: [name], MD  Admit date: 02/07/2021 Discharge date: 02/19/2021 30 Day Unplanned Readmission Risk Score    Flowsheet Row ED to Hosp-Admission (Current) from 02/07/2021 in [facility]  30 Day Unplanned Readmission Risk Score (%) 17.79 Filed at 02/19/2021 0801       This score is the patient's risk of an unplanned readmission within 30 days of being discharged (0 -100%). The score is based on dignosis, age, lab data, medications, orders, and past utilization.   Low:  0-14.9   Medium: 15-21.9   High: 22-29.9   Extreme: 30 and above          Admitted From: Home Disposition: Home  Recommendations for Outpatient Follow-up:  Follow up with PCP in 1-2 weeks Please obtain BMP/CBC in one week Follow-up with cardiology per their recommendations. Please follow up with your PCP on the following pending results: Unresulted Labs (From admission, onward)     Start     Ordered   02/17/21 8144  Basic metabolic panel  Daily,   R     Question:  Specimen collection method  Answer:  Lab=Lab collect   02/16/21 1050              Home Health: Yes Equipment/Devices: None  Discharge Condition: Stable CODE STATUS: DNR Diet recommendation: Cardiac  Subjective: Seen and examined.  He has no complaints.  He wants to go home.  Brief/Interim Summary: AEDON Clayton is a 80 y.o. male with medical history significant of HTN, HLD, PAF on chronic anticoagulation, s/p AICD, LV thrombus, ICM, CAD s/p CABG, CVA, colon cancer s/p sigmoid colon resection who presented with complaints of progressively worsening swelling over the last 2 -3 weeks.  Patient was admitted under TRH/hospitalist with acute on chronic combined systolic and diastolic congestive heart failure.  Detailed hospitalization course as below.   Acute on chronic combined systolic and diastolic congestive heart  failure: BNP of 2099.8.  Chest x-ray noted cardiomegaly with small bilateral pleural effusions.  Last echocardiogram noted EF of less than 20% with global hypokinesis in 05/2019 and grade 2 diastolic dysfunction.  Patient was initially started on bolus doses of Lasix which did not help improve his congestive heart failure with no significant urine output, he was eventually started on Lasix infusion which helped significantly with diuresis and he has diuresed 19 L since then.  He has been transition to oral Lasix.  Cardiology was managing this.  Cardiology has cleared him for discharge on Lasix 60 mg p.o. daily and resuming home medications.   Permanent atrial fibrillation on chronic anticoagulation: Rate controlled.  Continue Xarelto and Coreg however his Xarelto dose was decreased from 20 mg to 50 mg based off of creatinine clearance which is 36. Family was informed about this change, spoke to dtr Lennice Sites.   Essential hypertension: Blood pressure soft intermittently but patient remains asymptomatic.  Continue home Coreg 3.125 mg p.o. twice daily with holding parameters   CAD s/p CABG: Last cardiac cath from 11/22/2018 noted multivessel coronary artery disease with successful PCI to SVG to RPDA with a drug-eluting stent.  Asymptomatic.   History of VT ischemic cardiomyopathy s/p AICD  Hyponatremia: Acute.  Resolved.   Chronic kidney disease stage IIIa: Creatinine has been stable with slight upturn today, recheck in AM   Hyperlipidemia -Continue atorvastatin   GERD -Continue Protonix  Discharge Diagnoses:  Principal Problem:  Acute on chronic systolic CHF (congestive heart failure) (HCC) Active Problems:   Hyperlipidemia   Cardiac defibrillator  MDT VVI   Persistent atrial fibrillation   Coronary artery disease involving native heart with angina pectoris [facility])   Essential hypertension   Chronic anticoagulation   CHF (congestive heart failure) ([place])    Discharge  Instructions   Allergies as of 02/19/2021       Reactions   Buspar [buspirone] Other (See Comments)   Fatigue, body aches and shortness of breath    Latex Rash   Tape Rash, Other (See Comments)   USE PAPER        Medication List     STOP taking these medications    baclofen 10 MG tablet Commonly known as: LIORESAL       TAKE these medications    acetaminophen 500 MG tablet Commonly known as: TYLENOL Take 500 mg by mouth every 6 (six) hours as needed for moderate pain or headache.   atorvastatin 80 MG tablet Commonly known as: LIPITOR TAKE 1 TABLET BY MOUTH  DAILY   bisacodyl 10 MG suppository Commonly known as: DULCOLAX Place 10 mg rectally See admin instructions. Qd on Monday and Thursday as needed for constipation   carvedilol 3.125 MG tablet Commonly known as: COREG Take 1 tablet (3.125 mg total) by mouth 2 (two) times daily with a meal. What changed: additional instructions   divalproex 250 MG 24 hr tablet Commonly known as: Depakote ER Take 2 tablets every night   ferrous sulfate 325 (65 FE) MG tablet Take 1 tablet (325 mg total) by mouth 2 (two) times daily with a meal. What changed: when to take this   furosemide 20 MG tablet Commonly known as: LASIX Take 3 tablets (60 mg total) by mouth daily. Start taking on: February 20, 2021 What changed:  medication strength when to take this   meclizine 25 MG tablet Commonly known as: ANTIVERT Take 12.5 mg by mouth daily as needed for dizziness.   nitroGLYCERIN 0.4 MG SL tablet Commonly known as: NITROSTAT DISSOLVE 1 TABLET UNDER THE TONGUE EVERY 5 MINUTES AS  NEEDED FOR CHEST PAIN. MAX  OF 3 TABLETS IN 15 MINUTES. CALL 911 IF PAIN PERSISTS.   pantoprazole 40 MG tablet Commonly known as: PROTONIX TAKE 1 TABLET BY MOUTH  DAILY   polyethylene glycol 17 g packet Commonly known as: MIRALAX / GLYCOLAX Take 17 g by mouth daily as needed for mild constipation.   potassium chloride 10 MEQ  tablet Commonly known as: KLOR-CON Take 20 mEq by mouth every other day.   rivaroxaban 20 MG Tabs tablet Commonly known as: XARELTO Take 20 mg by mouth every evening. What changed: Another medication with the same name was added. Make sure you understand how and when to take each.   Rivaroxaban 15 MG Tabs tablet Commonly known as: XARELTO Take 1 tablet (15 mg total) by mouth daily with supper. What changed: You were already taking a medication with the same name, and this prescription was added. Make sure you understand how and when to take each.   sennosides-docusate sodium 8.6-50 MG tablet Commonly known as: SENOKOT-S Take 2 tablets by mouth at bedtime.   traZODone 50 MG tablet Commonly known as: DESYREL Take 2 tablet every night   vitamin B-12 1000 MCG tablet Commonly known as: CYANOCOBALAMIN Take 1,000 mcg by mouth daily.        Follow-up Information     [facility] [place] Follow up.   Specialty:  Home Health Services Why: Registered Nurse, Physical Therapy- Office to call with visit times. Contact information: [place] [address] [phone]         [facility]. Go on 02/27/2021.   Specialty: Cardiology Why: December 1 at 2:00Pm  HV TOC appt within [place]. please note this is at [facility].  call for directions if needed.  Bring all medications with you. Parking garage code: Dentist information: [address] [id number] [place]        [name], MD. Go on 02/25/2021.   Specialty: Internal Medicine Why: @2 :20pm Contact information: [place] [address] [phone]         Deboraha Sprang, MD .   Specialty: Cardiology Contact information: [phone] [address] [place] [address] [phone]         Jerline Pain, MD .   Specialty:  Cardiology Contact information: [phone] [place] [address] [phone]         [name], MD Follow up in 1 week(s).   Specialty: Internal Medicine Contact information: [place] [address] [phone]         Deboraha Sprang, MD .   Specialty: Cardiology Contact information: [phone] [address] [place] [address] [phone]         Jerline Pain, MD .   Specialty: Cardiology Contact information: [phone] [place] [address] [phone]                Allergies  Allergen Reactions   Buspar [Buspirone] Other (See Comments)    Fatigue, body aches and shortness of breath    Latex Rash   Tape Rash and Other (See Comments)    USE PAPER    Consultations: Cardiology and palliative care   Procedures/Studies: DG Chest 2 View  Result Date: 02/07/2021 CLINICAL DATA:  Shortness of breath, CHF. EXAM: CHEST - 2 VIEW COMPARISON:  Chest x-ray 11/13/2020. FINDINGS: Left-sided pacemaker is present. Patient is status post cardiac surgery. The heart is enlarged, unchanged. There are small bilateral pleural effusions, left greater than right. There is no lung consolidation or pneumothorax. No acute fractures are seen. IMPRESSION: 1. Cardiomegaly with small bilateral pleural effusions. Electronically Signed   By: Ronney Asters M.D.   On: 02/07/2021 23:13   ECHOCARDIOGRAM COMPLETE  Result Date: 02/08/2021    ECHOCARDIOGRAM REPORT   Patient Name:   ROBSON TRICKEY Date of Exam: 02/08/2021 Medical Rec #:  [id number]      Height:       71.0 in Accession #:    [id number]     Weight:       153.7 lb Date of Birth:  [birth year]      BSA:          1.885 m Patient Age:    8 years       BP:           101/74 mmHg Patient Gender: M              HR:           76 bpm. Exam Location:  Inpatient Procedure: 2D Echo, Cardiac Doppler, Color Doppler and Intracardiac            Opacification  Agent Indications:  CHF-Acute Systolic  History:        Patient has prior history of Echocardiogram examinations, most                 recent 06/07/2019. CHF and Cardiomyopathy, CAD and Previous                 Myocardial Infarction, Defibrillator, Stroke, Arrythmias:Atrial                 Fibrillation; Risk Factors:Hypertension and Dyslipidemia.  Sonographer:    Wenda Low Referring Phys: [id number] RONDELL A SMITH IMPRESSIONS  1. Left ventricular ejection fraction, by estimation, is <20%. The left ventricle has severely decreased function. The left ventricle demonstrates global hypokinesis. The left ventricular internal cavity size was moderately dilated. Left ventricular diastolic parameters are consistent with Grade III diastolic dysfunction (restrictive).  2. Right ventricular systolic function is normal. The right ventricular size is moderately enlarged.  3. Left atrial size was severely dilated.  4. The mitral valve is normal in structure. Trivial mitral valve regurgitation. No evidence of mitral stenosis.  5. The aortic valve is tricuspid. There is moderate calcification of the aortic valve. There is moderate thickening of the aortic valve. Aortic valve regurgitation is trivial. Aortic valve sclerosis/calcification is present, without any evidence of aortic stenosis.  6. The inferior vena cava is dilated in size with <50% respiratory variability, suggesting right atrial pressure of 15 mmHg. Comparison(s): No significant change from prior study. Prior images reviewed side by side. FINDINGS  Left Ventricle: Left ventricular ejection fraction, by estimation, is <20%. The left ventricle has severely decreased function. The left ventricle demonstrates global hypokinesis. Definity contrast agent was given IV to delineate the left ventricular endocardial borders. The left ventricular internal cavity size was moderately dilated. There is no left ventricular hypertrophy. Left ventricular diastolic parameters are  consistent with Grade III diastolic dysfunction (restrictive). Right Ventricle: The right ventricular size is moderately enlarged. No increase in right ventricular wall thickness. Right ventricular systolic function is normal. Left Atrium: Left atrial size was severely dilated. Right Atrium: Right atrial size was normal in size. Pericardium: There is no evidence of pericardial effusion. Mitral Valve: The mitral valve is normal in structure. Trivial mitral valve regurgitation. No evidence of mitral valve stenosis. MV peak gradient, 2.7 mmHg. The mean mitral valve gradient is 1.0 mmHg. Tricuspid Valve: The tricuspid valve is normal in structure. Tricuspid valve regurgitation is mild . No evidence of tricuspid stenosis. Aortic Valve: The aortic valve is tricuspid. There is moderate calcification of the aortic valve. There is moderate thickening of the aortic valve. Aortic valve regurgitation is trivial. Aortic valve sclerosis/calcification is present, without any evidence of aortic stenosis. Aortic valve mean gradient measures 1.5 mmHg. Aortic valve peak gradient measures 3.0 mmHg. Aortic valve area, by VTI measures 1.86 cm. Pulmonic Valve: The pulmonic valve was normal in structure. Pulmonic valve regurgitation is trivial. No evidence of pulmonic stenosis. Aorta: The aortic root is normal in size and structure. Venous: The inferior vena cava is dilated in size with less than 50% respiratory variability, suggesting right atrial pressure of 15 mmHg. IAS/Shunts: No atrial level shunt detected by color flow Doppler. Additional Comments: A device lead is visualized in the right ventricle.  LEFT VENTRICLE PLAX 2D LVIDd:         7.60 cm LVIDs:         6.80 cm LV PW:         0.70 cm LV IVS:  0.70 cm LVOT diam:     2.10 cm LV SV:         30 LV SV Index:   16 LVOT Area:     3.46 cm  RIGHT VENTRICLE RV Basal diam:  4.55 cm RV Mid diam:    3.60 cm RV S prime:     5.63 cm/s TAPSE (M-mode): 1.7 cm LEFT ATRIUM               Index        RIGHT ATRIUM           Index LA diam:        4.90 cm  2.60 cm/m   RA Area:     27.40 cm LA Vol (A2C):   97.3 ml  51.61 ml/m  RA Volume:   96.50 ml  51.19 ml/m LA Vol (A4C):   122.0 ml 64.72 ml/m LA Biplane Vol: 119.0 ml 63.13 ml/m  AORTIC VALVE                    PULMONIC VALVE AV Area (Vmax):    2.02 cm     PV Vmax:       0.43 m/s AV Area (Vmean):   1.68 cm     PV Peak grad:  0.7 mmHg AV Area (VTI):     1.86 cm AV Vmax:           86.20 cm/s AV Vmean:          57.250 cm/s AV VTI:            0.161 m AV Peak Grad:      3.0 mmHg AV Mean Grad:      1.5 mmHg LVOT Vmax:         50.35 cm/s LVOT Vmean:        27.700 cm/s LVOT VTI:          0.086 m LVOT/AV VTI ratio: 0.54  AORTA Ao Root diam: 3.10 cm MITRAL VALVE               TRICUSPID VALVE MV Area (PHT): 4.89 cm    TR Peak grad:   31.1 mmHg MV Area VTI:   1.48 cm    TR Vmax:        279.00 cm/s MV Peak grad:  2.7 mmHg MV Mean grad:  1.0 mmHg    SHUNTS MV Vmax:       0.83 m/s    Systemic VTI:  0.09 m MV Vmean:      43.6 cm/s   Systemic Diam: 2.10 cm MV Decel Time: 155 msec MV E velocity: 81.40 cm/s Candee Furbish MD Electronically signed by Candee Furbish MD Signature Date/Time: 02/08/2021/4:02:46 PM    Final    CUP PACEART REMOTE DEVICE CHECK  Result Date: 01/22/2021 Scheduled remote reviewed. Normal device function.  AF burden 100%, CVR, Coreg, Xarelto prescribed Optivol crossed threshold 9/23 and is ongoing, route to triage Next remote 91 days. LR    Discharge Exam: Vitals:   02/18/21 1136 02/18/21 1931  BP: (!) 114/58 95/61  Pulse: 78 81  Resp: 18 18  Temp: 98 F (36.7 C) (!) 97.3 F (36.3 C)  SpO2: 100% 100%   Vitals:   02/17/21 1926 02/18/21 0323 02/18/21 1136 02/18/21 1931  BP: (!) 96/57 (!) 87/55 (!) 114/58 95/61  Pulse: (!) 54 75 78 81  Resp: 19 16 18 18   Temp: 97.7 F (36.5 C) (!) 97.3 F (36.3 C)  98 F (36.7 C) (!) 97.3 F (36.3 C)  TempSrc: Oral Oral Oral Oral  SpO2: 96% 98% 100% 100%  Weight:  51.8 kg    Height:         General: Pt is alert, awake, not in acute distress Cardiovascular: RRR, S1/S2 +, no rubs, no gallops Respiratory: CTA bilaterally, no wheezing, no rhonchi Abdominal: Soft, NT, ND, bowel sounds + Extremities: no edema, no cyanosis    The results of significant diagnostics from this hospitalization (including imaging, microbiology, ancillary and laboratory) are listed below for reference.     Microbiology: No results found for this or any previous visit (from the past 240 hour(s)).   Labs: BNP (last 3 results) Recent Labs    11/14/20 1520 02/07/21 2254 02/17/21 0221  BNP 1,881.2* 2,099.8* 1,487.5*   Basic Metabolic Panel: Recent Labs  Lab 02/15/21 0238 02/16/21 0245 02/17/21 0221 02/18/21 0255 02/19/21 0319  NA 136 133* 135 135 135  K 3.7 4.3 4.6 5.5* 4.4  CL 100 98 97* 98 99  CO2 27 26 31 28 28   GLUCOSE 96 99 112* 93 138*  BUN 18 20 26* 32* 30*  CREATININE 1.13 1.19 1.20 1.32* 1.22  CALCIUM 8.5* 8.9 9.0 9.2 9.3  MG 2.1  --   --   --   --    Liver Function Tests: No results for input(s): AST, ALT, ALKPHOS, BILITOT, PROT, ALBUMIN in the last 168 hours. No results for input(s): LIPASE, AMYLASE in the last 168 hours. No results for input(s): AMMONIA in the last 168 hours. CBC: Recent Labs  Lab 02/17/21 0221  WBC 5.2  HGB 13.1  HCT 41.1  MCV 95.6  PLT 188   Cardiac Enzymes: No results for input(s): CKTOTAL, CKMB, CKMBINDEX, TROPONINI in the last 168 hours. BNP: Invalid input(s): POCBNP CBG: No results for input(s): GLUCAP in the last 168 hours. D-Dimer No results for input(s): DDIMER in the last 72 hours. Hgb A1c No results for input(s): HGBA1C in the last 72 hours. Lipid Profile No results for input(s): CHOL, HDL, LDLCALC, TRIG, CHOLHDL, LDLDIRECT in the last 72 hours. Thyroid function studies No results for input(s): TSH, T4TOTAL, T3FREE, THYROIDAB in the last 72 hours.  Invalid input(s): FREET3 Anemia work up No results for input(s):  VITAMINB12, FOLATE, FERRITIN, TIBC, IRON, RETICCTPCT in the last 72 hours. Urinalysis    Component Value Date/Time   COLORURINE YELLOW 11/13/2020 2027   APPEARANCEUR CLEAR 11/13/2020 2027   LABSPEC 1.010 11/13/2020 2027   PHURINE 5.0 11/13/2020 2027   GLUCOSEU NEGATIVE 11/13/2020 2027   HGBUR SMALL (A) 11/13/2020 2027   BILIRUBINUR NEGATIVE 11/13/2020 2027   KETONESUR NEGATIVE 11/13/2020 2027   PROTEINUR NEGATIVE 11/13/2020 2027   UROBILINOGEN 1.0 12/11/2008 1150   NITRITE POSITIVE (A) 11/13/2020 2027   LEUKOCYTESUR SMALL (A) 11/13/2020 2027   Sepsis Labs Invalid input(s): PROCALCITONIN,  WBC,  LACTICIDVEN Microbiology No results found for this or any previous visit (from the past 240 hour(s)).   Time coordinating discharge: Over 30 minutes  SIGNED:   Darliss Cheney, MD  [facility] 02/19/2021, 1:54 PM  If 7PM-7AM, please contact night-coverage [email or web address]

## 2021-02-19 NOTE — Consult Note (Signed)
[facility] Inpatient Consult   02/19/2021  RASHIED CORALLO 09/27/1940 [id number]  [facility] CM)  Patient chart reviewed with medium risk score for unplanned readmission and 2 hospitalizations within past 6 months. Assessed for post hospital [facility] CM chronic disease management program.   Spoke with patient and family at bedside. Explained [facility] CM program as ambulatory chronic case management and care coordination. Brochure provided. Patient's primary provider office offers embedded CCM services. Services provided as benefit of insurance plan.   Referral to [place] RN case manager placed for post hospital follow up. Patient's daughters, [name] and [place] Bells, are contact persons. Donielle states that contact numbers are listed in chart.   Of note, [facility] Care Management services does not replace or interfere with any services that are arranged by inpatient case management or social work.    Netta Cedars, MSN, RN [facility] [employer] [phone]  Toll free office [phone]

## 2021-02-19 NOTE — Progress Notes (Signed)
Occupational Therapy Treatment Patient Details Name: Mark Clayton MRN: [id number] DOB: Apr 04, 1940 Today's Date: 02/19/2021   History of present illness Pt is an 80 y.o. male who presented 02/07/21 with lower extremity edema and swelling into his abdomen along with malaise, cough, orthopnea, and wheezing. Chest x-ray noted cardiomegaly with small bilateral pleural effusions. Pt admitted with acute on chronic systolic congestive heart failure. PMH: HTN, HLD, PAF on chronic anticoagulation, s/p AICD, LV thrombus, ICM, CAD s/p CABG, CVA, colon cancer s/p sigmoid colon resection   OT comments  Patient scheduled for discharge this date.  Patient initially setup for seated grooming session, nurse tech in the room and taking over for OT for ADL prior to discharge.  Patient has a PCA at home that assists as needed for ADL support.  He is close to baseline for ADL performance from sit/stand level, but HH OT can be considered if [facility] PT is being ordered.     Recommendations for follow up therapy are one component of a multi-disciplinary discharge planning process, led by the attending physician.  Recommendations may be updated based on patient status, additional functional criteria and insurance authorization.    Follow Up Recommendations  No OT follow up    Assistance Recommended at Discharge    Equipment Recommendations  None recommended by OT    Recommendations for Other Services      Precautions / Restrictions Precautions Precautions: Fall Restrictions Weight Bearing Restrictions: No       Mobility Bed Mobility Overal bed mobility: Needs Assistance Bed Mobility: Sit to Supine     Supine to sit: Min assist          Transfers                         Balance Overall balance assessment: Needs assistance Sitting-balance support: No upper extremity supported;Feet supported Sitting balance-Leahy Scale: Good                                     ADL either  performed or assessed with clinical judgement   ADL       Grooming: Wash/dry hands;Wash/dry face;Oral care;Brushing hair;Set up;Sitting                                        Cognition Arousal/Alertness: Awake/alert Behavior During Therapy: WFL for tasks assessed/performed Overall Cognitive Status: History of cognitive impairments - at baseline                                                              Pertinent Vitals/ Pain       Pain Assessment: No/denies pain Pain Intervention(s): Monitored during session                                                          Frequency  Min 2X/week        Progress Toward Goals  OT  Goals(current goals can now be found in the care plan section)  Progress towards OT goals: Progressing toward goals  Acute Rehab OT Goals OT Goal Formulation: With patient Time For Goal Achievement: 02/24/21 Potential to Achieve Goals: Good  Plan Discharge plan remains appropriate    Co-evaluation                 AM-PAC OT "6 Clicks" Daily Activity     Outcome Measure   Help from another person eating meals?: A Little Help from another person taking care of personal grooming?: A Little Help from another person toileting, which includes using toliet, bedpan, or urinal?: A Lot Help from another person bathing (including washing, rinsing, drying)?: A Lot Help from another person to put on and taking off regular upper body clothing?: A Lot Help from another person to put on and taking off regular lower body clothing?: A Lot 6 Click Score: 14    End of Session    OT Visit Diagnosis: Unsteadiness on feet (R26.81);Other abnormalities of gait and mobility (R26.89);Muscle weakness (generalized) (M62.81)   Activity Tolerance Patient tolerated treatment well   Patient Left in bed;with call bell/phone within reach;with nursing/sitter in room   Nurse Communication           Time: 1001-1014 OT Time Calculation (min): 13 min  Charges: OT General Charges $OT Visit: 1 Visit OT Treatments $Self Care/Home Management : 8-22 mins  02/19/2021  RP, OTR/L  Acute Rehabilitation Services  Office:  [phone]   Metta Clines 02/19/2021, 10:29 AM

## 2021-02-19 NOTE — TOC Transition Note (Signed)
Transition of Care [facility]) - CM/SW Discharge Note   Patient Details  Name: Mark Clayton MRN: [id number] Date of Birth: 07-26-1940  Transition of [facility]) CM/SW Contact:  Zenon Mayo, RN Phone Number: 02/19/2021, 10:16 AM   Clinical Narrative:    Patient is for dc today, NCM notified Stacie with [facility].  Daughter will transport home,    Final next level of care: [place] Barriers to Discharge: Continued Medical Work up   Patient Goals and CMS Choice Patient states their goals for this hospitalization and ongoing recovery are:: To return home with home health services.      Discharge Placement                       Discharge Plan and Services In-house Referral: NA Discharge Planning Services: CM Consult Post Acute Care Choice: Home Health, Resumption of Svcs/PTA Provider            DME Agency: NA       HH Arranged: RN, Disease Management, PT [place] Agency: [place] Date [facility] Agency Contacted: 02/09/21 Time [place]: [place] Representative spoke with at [place]: [place]  Social Determinants of Health ([place]) Interventions Financial Strain Interventions: Other (Comment) (HF pharmacy to see) Housing Interventions: Intervention Not Indicated Transportation Interventions: Intervention Not Indicated   Readmission Risk Interventions No flowsheet data found.

## 2021-02-19 NOTE — Progress Notes (Signed)
     Note plans for D/C   Would discharge pt on 60 mg daily of lasix  Will make sure he has appt for follow up  Signed, Dorris Carnes, MD  02/19/2021, 8:23 AM

## 2021-02-19 NOTE — Progress Notes (Addendum)
Patient refused all night time medications.Patient educated  and patient still refused. Will continue to educate. Md informed.

## 2021-02-25 ENCOUNTER — Encounter: Payer: Self-pay | Admitting: Internal Medicine

## 2021-02-25 ENCOUNTER — Ambulatory Visit (INDEPENDENT_AMBULATORY_CARE_PROVIDER_SITE_OTHER): Payer: Medicare Other | Admitting: Internal Medicine

## 2021-02-25 ENCOUNTER — Other Ambulatory Visit: Payer: Self-pay

## 2021-02-25 DIAGNOSIS — Z7189 Other specified counseling: Secondary | ICD-10-CM

## 2021-02-25 DIAGNOSIS — I5042 Chronic combined systolic (congestive) and diastolic (congestive) heart failure: Secondary | ICD-10-CM

## 2021-02-25 DIAGNOSIS — I4819 Other persistent atrial fibrillation: Secondary | ICD-10-CM | POA: Diagnosis not present

## 2021-02-25 DIAGNOSIS — I69351 Hemiplegia and hemiparesis following cerebral infarction affecting right dominant side: Secondary | ICD-10-CM

## 2021-02-25 DIAGNOSIS — D509 Iron deficiency anemia, unspecified: Secondary | ICD-10-CM | POA: Diagnosis not present

## 2021-02-25 MED ORDER — RIVAROXABAN 15 MG PO TABS: 15.0000 mg | ORAL_TABLET | Freq: Every day | ORAL | 1 refills | Status: DC

## 2021-02-25 MED ORDER — POTASSIUM CHLORIDE CRYS ER 10 MEQ PO TBCR
20.0000 meq | EXTENDED_RELEASE_TABLET | ORAL | 3 refills | Status: DC
Start: 2021-02-25 — End: 2021-04-30

## 2021-02-25 MED ORDER — CARVEDILOL 3.125 MG PO TABS: 3.1250 mg | ORAL_TABLET | Freq: Two times a day (BID) | ORAL | 3 refills | Status: DC

## 2021-02-25 NOTE — Progress Notes (Signed)
   Subjective:   Patient ID: Mark Clayton, male    DOB: [birth year], 80 y.o.   MRN: [id number]  HPI The patient is an 79 YO man coming in for hospital follow up. Was in for systolic heart failure and fluid accumulation. About 19 L fluid removed with lasix infusion at the hospital and is doing better since then. Walking a little more steadily (uses walker at home, wheelchair for distances).   Review of Systems  Constitutional:  Positive for activity change and appetite change.  HENT: Negative.    Eyes: Negative.   Respiratory:  Negative for cough, chest tightness and shortness of breath.   Cardiovascular:  Negative for chest pain, palpitations and leg swelling.  Gastrointestinal:  Negative for abdominal distention, abdominal pain, constipation, diarrhea, nausea and vomiting.  Musculoskeletal: Negative.   Skin: Negative.   Neurological: Negative.   Psychiatric/Behavioral:  Positive for hallucinations and sleep disturbance.        Stable visual hallucinations from eye problems   Objective:  Physical Exam Constitutional:      Appearance: He is well-developed. He is ill-appearing.  HENT:     Head: Normocephalic and atraumatic.  Cardiovascular:     Rate and Rhythm: Normal rate and regular rhythm.  Pulmonary:     Effort: Pulmonary effort is normal. No respiratory distress.     Breath sounds: Normal breath sounds. No wheezing or rales.  Abdominal:     General: Bowel sounds are normal. There is no distension.     Palpations: Abdomen is soft.     Tenderness: There is no abdominal tenderness. There is no rebound.  Musculoskeletal:     Cervical back: Normal range of motion.  Skin:    General: Skin is warm and dry.  Neurological:     Mental Status: He is alert and oriented to person, place, and time.     Coordination: Coordination abnormal.    Vitals:   02/25/21 1438  BP: 126/84  Pulse: 60  Resp: 18  SpO2: 99%  Weight: 119 lb 3.2 oz (54.1 kg)  Height: 5\' 11"  (1.803 m)    This  visit occurred during the SARS-CoV-2 public health emergency.  Safety protocols were in place, including screening questions prior to the visit, additional usage of staff PPE, and extensive cleaning of exam room while observing appropriate contact time as indicated for disinfecting solutions.   Assessment & Plan:

## 2021-02-25 NOTE — Patient Instructions (Addendum)
Let us know how you are doing or if you need anything.

## 2021-02-26 ENCOUNTER — Other Ambulatory Visit (HOSPITAL_COMMUNITY): Payer: Self-pay

## 2021-02-27 ENCOUNTER — Ambulatory Visit (HOSPITAL_COMMUNITY)
Admit: 2021-02-27 | Discharge: 2021-02-27 | Disposition: A | Discharge status: Home / Self Care | Payer: Medicare Other | Source: Ambulatory Visit | Attending: Family Medicine | Admitting: Family Medicine

## 2021-02-27 ENCOUNTER — Telehealth: Payer: Self-pay

## 2021-02-27 ENCOUNTER — Encounter (HOSPITAL_COMMUNITY): Payer: Self-pay

## 2021-02-27 VITALS — BP 100/76 | HR 67 | Wt 124.0 lb

## 2021-02-27 DIAGNOSIS — Z8673 Personal history of transient ischemic attack (TIA), and cerebral infarction without residual deficits: Secondary | ICD-10-CM | POA: Diagnosis not present

## 2021-02-27 DIAGNOSIS — Z7901 Long term (current) use of anticoagulants: Secondary | ICD-10-CM | POA: Diagnosis not present

## 2021-02-27 DIAGNOSIS — I4821 Permanent atrial fibrillation: Secondary | ICD-10-CM | POA: Diagnosis not present

## 2021-02-27 DIAGNOSIS — I4819 Other persistent atrial fibrillation: Secondary | ICD-10-CM | POA: Diagnosis not present

## 2021-02-27 DIAGNOSIS — R0602 Shortness of breath: Secondary | ICD-10-CM | POA: Insufficient documentation

## 2021-02-27 DIAGNOSIS — I2510 Atherosclerotic heart disease of native coronary artery without angina pectoris: Secondary | ICD-10-CM | POA: Diagnosis not present

## 2021-02-27 DIAGNOSIS — Z955 Presence of coronary angioplasty implant and graft: Secondary | ICD-10-CM | POA: Insufficient documentation

## 2021-02-27 DIAGNOSIS — N182 Chronic kidney disease, stage 2 (mild): Secondary | ICD-10-CM | POA: Diagnosis not present

## 2021-02-27 DIAGNOSIS — H3530 Unspecified macular degeneration: Secondary | ICD-10-CM | POA: Diagnosis not present

## 2021-02-27 DIAGNOSIS — R54 Age-related physical debility: Secondary | ICD-10-CM | POA: Insufficient documentation

## 2021-02-27 DIAGNOSIS — I255 Ischemic cardiomyopathy: Secondary | ICD-10-CM

## 2021-02-27 DIAGNOSIS — I5022 Chronic systolic (congestive) heart failure: Secondary | ICD-10-CM

## 2021-02-27 DIAGNOSIS — I130 Hypertensive heart and chronic kidney disease with heart failure and stage 1 through stage 4 chronic kidney disease, or unspecified chronic kidney disease: Secondary | ICD-10-CM | POA: Diagnosis not present

## 2021-02-27 DIAGNOSIS — E785 Hyperlipidemia, unspecified: Secondary | ICD-10-CM | POA: Diagnosis not present

## 2021-02-27 DIAGNOSIS — Z951 Presence of aortocoronary bypass graft: Secondary | ICD-10-CM | POA: Insufficient documentation

## 2021-02-27 DIAGNOSIS — Z79899 Other long term (current) drug therapy: Secondary | ICD-10-CM | POA: Diagnosis not present

## 2021-02-27 DIAGNOSIS — Z95810 Presence of automatic (implantable) cardiac defibrillator: Secondary | ICD-10-CM | POA: Diagnosis not present

## 2021-02-27 LAB — BASIC METABOLIC PANEL
Anion gap: 6 (ref 5–15)
BUN: 25 mg/dL — ABNORMAL HIGH (ref 8–23)
CO2: 28 mmol/L (ref 22–32)
Calcium: 9.1 mg/dL (ref 8.9–10.3)
Chloride: 104 mmol/L (ref 98–111)
Creatinine, Ser: 0.99 mg/dL (ref 0.61–1.24)
GFR, Estimated: >60 mL/min (ref >60)
Glucose, Bld: 91 mg/dL (ref 70–99)
Potassium: 5 mmol/L (ref 3.5–5.1)
Sodium: 138 mmol/L (ref 135–145)

## 2021-02-27 MED ORDER — CARVEDILOL 3.125 MG PO TABS: 3.1250 mg | ORAL_TABLET | Freq: Two times a day (BID) | ORAL | 2 refills | Status: DC

## 2021-02-27 NOTE — Assessment & Plan Note (Signed)
Does well with PT to help with gains and stability. Uses walker at home to get around short distances. Wheelchair for long distances.

## 2021-02-27 NOTE — Assessment & Plan Note (Signed)
We did discuss today that his heart failure is likely to progress at some point to where even lasix may not be able to maintain fluid and that we will continue to reassess treatment goals.

## 2021-02-27 NOTE — Assessment & Plan Note (Signed)
Labs reviewed from recent hospital stay and stable. No active signs of bleeding and they monitor for this given that he takes xarelto.

## 2021-02-27 NOTE — Assessment & Plan Note (Signed)
Appears stable on exam today. Seeing cardiology later this week. Talked with patient and sister about possible recurrent problems with fluid and further problems with his heart function and need to monitor weight and fluid carefully and call us or cardiology at first sign of weight gain so we can avoid hospital.

## 2021-02-27 NOTE — Patient Instructions (Signed)
Labs done today. We will contact you only if your labs are abnormal.  No medication changes were made. Please continue all current medications as prescribed.  Your physician recommends that you schedule a follow-up appointment in: 2 weeks  If you have any questions, issues, or concerns before your next appointment please call our office at [phone], opt. 2 and leave a message for the triage nurse.'

## 2021-02-27 NOTE — Progress Notes (Addendum)
HEART & VASCULAR TRANSITION OF CARE CONSULT NOTE     Referring Physician: Dr Harrington Challenger Primary Care: Dr Sharlet Salina  Primary Cardiologist: Dr Marlou Porch   HPI: Referred to clinic by Dr Harrington Challenger for heart failure consultation.   Mr Falconi is a 80 year old with a history of chronic HFrEF, ICM, CAD, S/P CABG 2003, DES to the native RCA and DES to S-RPDA (jump graft to RPL chronically occluded), permanent atrial fibrillation on chronic anticoagulation, VT, s/p Medtronic ICD, prior LV mural thrombus resolved on f/u studies, chronic kidney disease, DNR, hypertension, hyperlipidemia, memory impairment, and macular degeneration.    Admitted 02/08/2021 worsening lower extremity edema in the setting off A/C combined systolic/diastolic heart failure. Echo completed EF < 20%. Diuresed with IV lasix and transitioned to lasix 60 mg daily. Discharge weight 114 pounds.   Today he presents with his daughter. Overall feeling fine. Daughter says memory seems to be getting worse. Requires assistance with ADLs. Has 24 hour caregivers. Requires assistance standing. SOB with exertion. DeniesPND/Orthopnea. No chest pain. Appetite ok. No fever or chills. Unable to weigh at home due to balance issues. Taking all medications. All meds provided by family or caregiver.Having difficulty paying for xarelto and carvedilol.  Lives at home and requires assistance with ADLs. Progressive macular degeneration. He has completed GOLD DNR form.   Medtronic Interrogation-  Optivol- well below threshold, A fib 100%, activity < 30 minutes per day.   Cardiac Testing  01/2021. EF < 20, RV normal, LA severely reduced,  05/2019 LVEF < 20%. global HK, mod reduced RVSF, mod LAE, mild MR, mild AI   Cardiac catheterization 11/22/18 mRCA stent patent L-LAD patent S-RI/OM2/OM3 patent S-RPDA 95/jump graft to RPL CTO PCI: DES to S-RPDA    Review of Systems: [y] = yes, [ ]  = no   General: Weight gain [ ] ; Weight loss [ ] ; Anorexia [ ] ; Fatigue [ Y];  Fever [ ] ; Chills [ ] ; Weakness [ Y]  Cardiac: Chest pain/pressure [ ] ; Resting SOB [ ] ; Exertional SOB [ Y]; Orthopnea [ ] ; Pedal Edema [ ] ; Palpitations [ ] ; Syncope [ ] ; Presyncope [ ] ; Paroxysmal nocturnal dyspnea[ ]   Pulmonary: Cough [ ] ; Wheezing[ ] ; Hemoptysis[ ] ; Sputum [ ] ; Snoring [ ]   GI: Vomiting[ ] ; Dysphagia[ ] ; Melena[ ] ; Hematochezia [ ] ; Heartburn[ ] ; Abdominal pain [ ] ; Constipation [ ] ; Diarrhea [ ] ; BRBPR [ ]   GU: Hematuria[ ] ; Dysuria [ ] ; Nocturia[ ]   Vascular: Pain in legs with walking [ ] ; Pain in feet with lying flat [ ] ; Non-healing sores [ ] ; Stroke [ Y]; TIA [ ] ; Slurred speech [ ] ;  Neuro: Headaches[ ] ; Vertigo[ ] ; Seizures[ ] ; Paresthesias[ ] ;Blurred vision [ Y]; Diplopia [ ] ; Vision changes [ Y]  Ortho/Skin: Arthritis [ ] ; Joint pain [ Y]; Muscle pain [ ] ; Joint swelling [ ] ; Back Pain [ Y]; Rash [ ]   Psych: Depression[ ] ; Anxiety[ ]   Heme: Bleeding problems [ ] ; Clotting disorders [ ] ; Anemia [ ]   Endocrine: Diabetes [ ] ; Thyroid dysfunction[ ]    Past Medical History:  Diagnosis Date   AICD (automatic cardioverter/defibrillator) present 01/17/2003   Medtronic Maximo 7232CX ICD, serial #[id number]   Anemia 02/06/2011   takes oral iron   Arthritis    hands, knees   CAD (coronary artery disease) 2003   h/o MI and CABG in 2003. // s/p DES to [place] in 08/2014 // s/p DES to S-RPDA in 10/2018 (Cath w patent RCA stent, L-LAD and  S-RI/OM2/OM3; jump graft from RPDA to RPL w CTO)   Cancer of sigmoid colon ([place]) 2012   a. s/p colon surgery.   Carotid bruit    Cough    Exudative age-related macular degeneration of left eye with active choroidal neovascularization ([place]) 07/12/2019   Exudative age-related macular degeneration, right eye, with inactive choroidal neovascularization ([place]) 07/12/2019   GERD (gastroesophageal reflux disease) 02/06/2011   HFrEF (heart failure with reduced ejection fraction) ([place])    a. EF 20% in 2014; b. 08/2017 Echo: EF 20-25%, diff HK, Gr3 DD.  Triv AI. Mod MR. Sev dil LA. Mildly dil RV w/ mildly reduced RV fxn. Mildly dil RA. Mod TR. PASP 60mHg. // Echo 3/21: EF < 20, mild MR, mild AI, mod LAE, mod reduced RVSF   HTN (hypertension)    Hyperlipidemia    Ischemic cardiomyopathy    a. EF 20% in 2014. (Master study EF >20%); b. 08/2017 Echo: EF 20-25%, diff HK. Gr3 DD.   LV (left ventricular) mural thrombus    a. 12/2012 Echo: EF 20% with mural thrombus No evidence of thrombus on 08/2017 echo.   Macular degeneration    Myocardial infarct ([place])    2003   PAF (paroxysmal atrial fibrillation) (H[facility])    a. CHA2DS2VASc = 5-->Xarelto/Tikosyn.   Ventricular tachycardia 11/05/2020    Current Outpatient Medications  Medication Sig Dispense Refill   acetaminophen (TYLENOL) 500 MG tablet Take 500 mg by mouth every 6 (six) hours as needed for moderate pain or headache.     atorvastatin (LIPITOR) 80 MG tablet TAKE 1 TABLET BY MOUTH  DAILY 90 tablet 3   bisacodyl (DULCOLAX) 10 MG suppository Place 10 mg rectally See admin instructions. Qd on Monday and Thursday as needed for constipation     carvedilol (COREG) 3.125 MG tablet Take 1 tablet (3.125 mg total) by mouth 2 (two) times daily with a meal. Hold for sbp<100 or hr<60 90 tablet 3   divalproex (DEPAKOTE ER) 250 MG 24 hr tablet Take 2 tablets every night 60 tablet 11   ferrous sulfate 325 (65 FE) MG tablet Take 1 tablet (325 mg total) by mouth 2 (two) times daily with a meal. (Patient taking differently: Take 325 mg by mouth daily with breakfast.)  3   furosemide (LASIX) 20 MG tablet Take 3 tablets (60 mg total) by mouth daily. 90 tablet 0   meclizine (ANTIVERT) 25 MG tablet Take 12.5 mg by mouth daily as needed for dizziness.     nitroGLYCERIN (NITROSTAT) 0.4 MG SL tablet DISSOLVE 1 TABLET UNDER THE TONGUE EVERY 5 MINUTES AS  NEEDED FOR CHEST PAIN. MAX  OF 3 TABLETS IN 15 MINUTES. CALL 911 IF PAIN PERSISTS. 100 tablet 3   pantoprazole (PROTONIX) 40 MG tablet TAKE 1 TABLET BY MOUTH  DAILY 90  tablet 3   polyethylene glycol (MIRALAX / GLYCOLAX) 17 g packet Take 17 g by mouth daily as needed for mild constipation.     potassium chloride (KLOR-CON M) 10 MEQ tablet Take 2 tablets (20 mEq total) by mouth every other day. 90 tablet 3   Rivaroxaban (XARELTO) 15 MG TABS tablet Take 1 tablet (15 mg total) by mouth daily with supper. 90 tablet 1   sennosides-docusate sodium (SENOKOT-S) 8.6-50 MG tablet Take 2 tablets by mouth at bedtime.     traZODone (DESYREL) 50 MG tablet Take 2 tablet every night 60 tablet 5   vitamin B-12 (CYANOCOBALAMIN) 1000 MCG tablet Take 1,000 mcg by mouth daily.  No current facility-administered medications for this encounter.    Allergies  Allergen Reactions   Buspar [Buspirone] Other (See Comments)    Fatigue, body aches and shortness of breath    Latex Rash   Tape Rash and Other (See Comments)    USE PAPER      Social History   Socioeconomic History   Marital status: Widowed    Spouse name: Not on file   Number of children: 1   Years of education: Not on file   Highest education level: Not on file  Occupational History   Occupation: retired  Tobacco Use   Smoking status: Never   Smokeless tobacco: Never  Vaping Use   Vaping Use: Never used  Substance and Sexual Activity   Alcohol use: Not Currently    Alcohol/week: 2.0 standard drinks    Types: 2 Cans of beer per week   Drug use: No   Sexual activity: Not Currently  Other Topics Concern   Not on file  Social History Narrative   Full time. Married.    Right handed   Drinks caffeine   [facility]   Social Determinants of Health   Financial Resource Strain: High Risk   Difficulty of Paying Living Expenses: Hard  Food Insecurity: No Food Insecurity   Worried About [name] fundraiser in the Last Year: Never true   Ran Out of Food in the Last Year: Never true  Transportation Needs: No Transportation Needs   Lack of Transportation (Medical): No   Lack of  Transportation (Non-Medical): No  Physical Activity: Insufficiently Active   Days of Exercise per Week: 2 days   Minutes of Exercise per Session: 60 min  Stress: No Stress Concern Present   Feeling of Stress : Not at all  Social Connections: Not on file  Intimate Partner Violence: Not on file      Family History  Problem Relation Age of Onset   Hypertension Father    Hyperlipidemia Father    Heart disease Father    Prostate cancer Father    Alzheimer's disease Mother    Hypertension Sister    Hyperlipidemia Sister    Colon cancer Neg Hx    Esophageal cancer Neg Hx    Rectal cancer Neg Hx    Stomach cancer Neg Hx     Vitals:   02/27/21 1407  BP: 100/76  Pulse: 67  SpO2: 100%  Weight: 56.2 kg   Wt Readings from Last 3 Encounters:  02/27/21 56.2 kg  02/25/21 54.1 kg  02/18/21 51.8 kg    PHYSICAL EXAM: General: Thin, frail. No respiratory difficulty. Arrived in a wheel chair.  HEENT: normal Neck: supple. no JVD. Carotids 2+ bilat; no bruits. No lymphadenopathy or thryomegaly appreciated. Cor: PMI nondisplaced. Regular rate & rhythm. No rubs, gallops or murmurs. Lungs: clear Abdomen: soft, nontender, nondistended. No hepatosplenomegaly. No bruits or masses. Good bowel sounds. Extremities: no cyanosis, clubbing, rash, edema Neuro: alert & oriented x 3, cranial nerves grossly intact. moves all 4 extremities w/o difficulty. Affect pleasant.     ASSESSMENT & PLAN: Chronic HFrEF , ICM  Echo EF < 20% over the last few years.  NYHA III. GDMT limited by current condition. Frail and at high risk for falls. He has al Volume status stable. On Optivol well below threshold.  - Continue lasix 60 mg daily and discussed that he can take an extra 20 mg lasix for lower extremity edema.  - Continue low dose carvedilol  3.125 mg twice a da . [facility] which is cheaper than his mail order.  - I discussed optimizing meds with him and his daughter.  - I will not add  ARNi/SGLT2i given the family would like to try keep fluid off and not add other meds. I think this is reasonable given frailty and concern for falls. He requires 24 hour care due balance and memory issues.  - Not a candidate for advanced therapies with age/frailty.  2. Permanent A fib  -Noted on Medtronic device.  -On xarelto 15 mg daily  -Given 30 day samples of xarelto.  3. CKD Stage II -Creatinine baseline 1.2-1.3  -Check BMET today.  4. H/O CVA  5. Macular Degenerative Disease 6. Memory Impairment 7. [place] He has completed GOLD DNR form and has that at home. Briefly discussed Hospice should decline.     Referred to HFSW (PCP, Medications, Transportation, ETOH Abuse, Drug Abuse, Insurance, Financial ): Yes  Refer to Pharmacy: Yes  Refer to Home Health: [facility] picking him up  Refer to Advanced Heart Failure Clinic: No  Refer to General Cardiology: Followed by Dr Marlou Porch and Dr Caryl Comes  Follow up  in 2 weeks to assess volume status.   Mahamud Metts NP-C  3:08 PM

## 2021-02-27 NOTE — Progress Notes (Signed)
Medication Samples have been provided to the patient.  Drug name: Xarelto       Strength: 15mg         Qty: 4  LOT: [id number]  Exp.Date: 06/2022  Dosing instructions: take 1 tab po qd  The patient has been instructed regarding the correct time, dose, and frequency of taking this medication, including desired effects and most common side effects.   Domonick Sittner R Jarmel Linhardt 7:94 PM 02/27/2021

## 2021-02-27 NOTE — Telephone Encounter (Signed)
Call attempted to confirm HV TOC appt today at 2pm. HIPPA appropriate VM left with callback number.   Pricilla Holm, MSN, RN Heart Failure Nurse Navigator [phone]

## 2021-02-27 NOTE — Assessment & Plan Note (Signed)
New dosing xarelto 15 mg daily (reduced due to GFR) prescribed today for him. Also refilled coreg as he needed this.

## 2021-02-28 ENCOUNTER — Telehealth: Payer: Self-pay

## 2021-02-28 ENCOUNTER — Ambulatory Visit (INDEPENDENT_AMBULATORY_CARE_PROVIDER_SITE_OTHER): Payer: Medicare Other

## 2021-02-28 DIAGNOSIS — I5022 Chronic systolic (congestive) heart failure: Secondary | ICD-10-CM | POA: Diagnosis not present

## 2021-02-28 DIAGNOSIS — Z95810 Presence of automatic (implantable) cardiac defibrillator: Secondary | ICD-10-CM | POA: Diagnosis not present

## 2021-02-28 NOTE — Progress Notes (Signed)
EPIC Encounter for ICM Monitoring  Patient Name: Mark Clayton is a 80 y.o. male Date: 02/28/2021 Primary Care Physican: [name], MD Cardiologist: Marlou Porch Electrophysiologist: Caryl Comes 02/27/2021 Weight: 124 lbs   Time in AF:  20.9 hr/day (87.1%) (taking Xarelto)          Attempted call to patient and unable to reach.  Left detailed message per DPR regarding transmission. Transmission reviewed.          Optivol Thoracic impedance suggesting fluid levels returned to normal after hospitalization for CHF.   Prescribed:  Furosemide 20 mg 3 tablets (60 mg total) by mouth daily. (02/27/21 [facility] OV note states he can take an extra 20 mg lasix for lower extremity edema).  Potassium 10 mEq take 2 tablets (20 mEq total) every other day   Labs: 02/27/2021 Creatinine 0.99, BUN 25, Potassium 5.0, Sodium 138, GFR >60 02/19/2021 Creatinine 1.22, BUN 30, Potassium 4.4, Sodium 135, GFR 60  02/18/2021 Creatinine 1.32, BUN 32, Potassium 5.5, Sodium 135, GFR 55  02/17/2021 Creatinine 1.20, BUN 26, Potassium 4.6, Sodium 135, GFR >60  02/16/2021 Creatinine 1.19, BUN 20, Potassium 4.3, Sodium 133, GFR >60  02/15/2021 Creatinine 1.13, BUN 18, Potassium 3.7, Sodium 136, GFR >60  02/14/2021 Creatinine 1.30, BUN 22, Potassium 3.3, Sodium 134, GFR 56  02/13/2021 Creatinine 1.37, BUN 26, Potassium 4.0, Sodium 136, GFR 52  02/12/2021 Creatinine 1.32, BUN 26, Potassium 3.8, Sodium 132, GFR 52  02/11/2021 Creatinine 1.44, BUN 23, Potassium 4.6, Sodium 136, GFR 49  02/10/2021 Creatinine 1.21, BUN 21, Potassium 3.5, Sodium 134, GFR >60 02/09/2021 Creatinine 1.24, BUN 21, Potassium 3.5, Sodium 131, GFR 59  02/07/2021 Creatinine 1.37, BUN 26, Potassium 5.1, Sodium 134, GFR 52  01/27/2021 Creatinine 1.09, BUN 16, Potassium 3.7, Sodium 135  A complete set of results can be found in Results Review.   Recommendations:  Left voice mail with ICM number and encouraged to call if experiencing any fluid symptoms.    Follow-up plan: ICM clinic phone appointment on 04/01/2021.  91 day device clinic remote transmission 04/23/2021.     EP/Cardiology Office Visits: Recall 01/26/2021 with Dr Marlou Porch.  04/03/2021 with Oda Kilts, PA.     Copy of ICM check sent to Dr. Caryl Comes.  3 month ICM trend: 02/27/2021.    12-14 Month ICM trend:       Rosalene Billings, RN 02/28/2021 10:48 AM

## 2021-02-28 NOTE — Telephone Encounter (Signed)
Remote ICM transmission received.  Attempted call to patient regarding ICM remote transmission and left detailed message per DPR.  Advised to return call for any fluid symptoms or questions.  

## 2021-03-03 ENCOUNTER — Telehealth: Payer: Self-pay

## 2021-03-03 NOTE — Telephone Encounter (Signed)
Returned call to Lennice Sites per DPR.  She said the remote monitor was unplugged.  She has plugged the monitor in is only showing Medtronic logo on the screen.  Attempted to assist in sending remote transmission but not successful.  Provided [employer] support number. She will call back after she speaks with tech support.

## 2021-03-12 ENCOUNTER — Telehealth (HOSPITAL_COMMUNITY): Payer: Self-pay

## 2021-03-12 NOTE — Telephone Encounter (Signed)
Call attempted to confirm HV TOC appt tomorrow (12/15) @ 2pm. HIPPA appropriate VM left with callback number.   Pricilla Holm, MSN, RN Heart Failure Nurse Navigator [phone]

## 2021-03-12 NOTE — Progress Notes (Addendum)
HEART & VASCULAR TRANSITION OF CARE PROGRESS NOTE     Referring Physician: Dr Harrington Challenger Primary Care: Dr Sharlet Salina  Primary Cardiologist: Dr Marlou Porch   HPI: Referred to clinic by Dr Harrington Challenger for heart failure consultation.   Mark Clayton is a 80 year old with a history of chronic HFrEF, ICM, CAD, S/P CABG 2003, DES to the native RCA and DES to S-RPDA (jump graft to RPL chronically occluded), permanent atrial fibrillation on chronic anticoagulation, VT, s/p Medtronic ICD, prior LV mural thrombus resolved on f/u studies, chronic kidney disease, DNR, hypertension, hyperlipidemia, memory impairment, and macular degeneration.    Admitted 02/08/2021 worsening lower extremity edema in the setting off A/C combined systolic/diastolic heart failure. Echo completed EF < 20%. Diuresed with IV lasix and transitioned to lasix 60 mg daily. Discharge weight 114 pounds. Referred to TOC.   Had initial assessment in [facility] clinic on 12/1. Was struggling w/ ADLs, requiring 24 hr assistance. SOB w/ exertion. Difficulties w/ poor balance and vision w/ progressive macular degeneration. Also having difficulities paying for Xarelto and carvedilol. Device interrogation showed optivol well below threshold. GOC including consideration of palliative care/ hospice services was discussed but patient declined. He did however complete GOLD DNR form.   He returns back today for f/u. Here w/ his daughter. Fluid overloaded. Fluid accumulation started shortly after his last visit w/ 7 lb wt gain. Reports full compliance w/ lasix. Has even self increased from 60 mg daily to 80 mg w/o much improvement/ change in urinary output. He has 1+ bilateral LEE on exam. Fluid index is up and just reached threshold. Impedance. down below reference curve. Chronic Afib. HF 80s.  BP soft, 104/70.   Medtronic Interrogation-   Optivol- trending  up and just reached threshold, Impedance down, A fib 100%, activity < 30 minutes per day.   Cardiac Testing  01/2021.  EF < 20, RV normal, LA severely reduced,  05/2019 LVEF < 20%. global HK, mod reduced RVSF, mod LAE, mild Mark, mild AI   Cardiac catheterization 11/22/18 mRCA stent patent L-LAD patent S-RI/OM2/OM3 patent S-RPDA 95/jump graft to RPL CTO PCI: DES to S-RPDA    Review of Systems: [y] = yes, [ ]  = no   General: Weight gain [ Y]; Weight loss [ ] ; Anorexia [ ] ; Fatigue [ Y]; Fever [ ] ; Chills [ ] ; Weakness [ Y]  Cardiac: Chest pain/pressure [ ] ; Resting SOB [ ] ; Exertional SOB [ Y]; Orthopnea [ ] ; Pedal Edema [ Y]; Palpitations [ ] ; Syncope [ ] ; Presyncope [ ] ; Paroxysmal nocturnal dyspnea[ ]   Pulmonary: Cough [ ] ; Wheezing[ ] ; Hemoptysis[ ] ; Sputum [ ] ; Snoring [ ]   GI: Vomiting[ ] ; Dysphagia[ ] ; Melena[ ] ; Hematochezia [ ] ; Heartburn[ ] ; Abdominal pain [ ] ; Constipation [ ] ; Diarrhea [ ] ; BRBPR [ ]   GU: Hematuria[ ] ; Dysuria [ ] ; Nocturia[ ]   Vascular: Pain in legs with walking [ ] ; Pain in feet with lying flat [ ] ; Non-healing sores [ ] ; Stroke [ Y]; TIA [ ] ; Slurred speech [ ] ;  Neuro: Headaches[ ] ; Vertigo[ ] ; Seizures[ ] ; Paresthesias[ ] ;Blurred vision [ Y]; Diplopia [ ] ; Vision changes [ Y]  Ortho/Skin: Arthritis [ ] ; Joint pain [ Y]; Muscle pain [ ] ; Joint swelling [ ] ; Back Pain [ Y]; Rash [ ]   Psych: Depression[ ] ; Anxiety[ ]   Heme: Bleeding problems [ ] ; Clotting disorders [ ] ; Anemia [ ]   Endocrine: Diabetes [ ] ; Thyroid dysfunction[ ]    Past Medical History:  Diagnosis Date  AICD (automatic cardioverter/defibrillator) present 01/17/2003   Medtronic Maximo 7232CX ICD, serial #[id number]   Anemia 02/06/2011   takes oral iron   Arthritis    hands, knees   CAD (coronary artery disease) 2003   h/o MI and CABG in 2003. // s/p DES to [place] in 08/2014 // s/p DES to S-RPDA in 10/2018 (Cath w patent RCA stent, L-LAD and S-RI/OM2/OM3; jump graft from RPDA to RPL w CTO)   Cancer of sigmoid colon ([place]) 2012   a. s/p colon surgery.   Carotid bruit    Cough    Exudative age-related macular  degeneration of left eye with active choroidal neovascularization ([place]) 07/12/2019   Exudative age-related macular degeneration, right eye, with inactive choroidal neovascularization ([place]) 07/12/2019   GERD (gastroesophageal reflux disease) 02/06/2011   HFrEF (heart failure with reduced ejection fraction) ([place])    a. EF 20% in 2014; b. 08/2017 Echo: EF 20-25%, diff HK, Gr3 DD. Triv AI. Mod Mark. Sev dil LA. Mildly dil RV w/ mildly reduced RV fxn. Mildly dil RA. Mod TR. PASP 60mHg. // Echo 3/21: EF < 20, mild Mark, mild AI, mod LAE, mod reduced RVSF   HTN (hypertension)    Hyperlipidemia    Ischemic cardiomyopathy    a. EF 20% in 2014. (Master study EF >20%); b. 08/2017 Echo: EF 20-25%, diff HK. Gr3 DD.   LV (left ventricular) mural thrombus    a. 12/2012 Echo: EF 20% with mural thrombus No evidence of thrombus on 08/2017 echo.   Macular degeneration    Myocardial infarct ([place])    2003   PAF (paroxysmal atrial fibrillation) (HCC)    a. CHA2DS2VASc = 5-->Xarelto/Tikosyn.   Ventricular tachycardia 11/05/2020    Current Outpatient Medications  Medication Sig Dispense Refill   acetaminophen (TYLENOL) 500 MG tablet Take 500 mg by mouth every 6 (six) hours as needed for moderate pain or headache.     atorvastatin (LIPITOR) 80 MG tablet TAKE 1 TABLET BY MOUTH  DAILY 90 tablet 3   bisacodyl (DULCOLAX) 10 MG suppository Place 10 mg rectally See admin instructions. Qd on Monday and Thursday as needed for constipation     carvedilol (COREG) 3.125 MG tablet Take 1 tablet (3.125 mg total) by mouth 2 (two) times daily with a meal. Hold for sbp<100 or hr<60 30 tablet 2   divalproex (DEPAKOTE ER) 250 MG 24 hr tablet Take 2 tablets every night 60 tablet 11   ferrous sulfate 325 (65 FE) MG tablet Take 1 tablet (325 mg total) by mouth 2 (two) times daily with a meal. (Patient taking differently: Take 325 mg by mouth daily with breakfast.)  3   furosemide (LASIX) 20 MG tablet Take 3 tablets (60 mg total) by mouth  daily. 90 tablet 0   meclizine (ANTIVERT) 25 MG tablet Take 12.5 mg by mouth daily as needed for dizziness.     nitroGLYCERIN (NITROSTAT) 0.4 MG SL tablet DISSOLVE 1 TABLET UNDER THE TONGUE EVERY 5 MINUTES AS  NEEDED FOR CHEST PAIN. MAX  OF 3 TABLETS IN 15 MINUTES. CALL 911 IF PAIN PERSISTS. 100 tablet 3   pantoprazole (PROTONIX) 40 MG tablet TAKE 1 TABLET BY MOUTH  DAILY 90 tablet 3   polyethylene glycol (MIRALAX / GLYCOLAX) 17 g packet Take 17 g by mouth daily as needed for mild constipation.     potassium chloride (KLOR-CON M) 10 MEQ tablet Take 2 tablets (20 mEq total) by mouth every other day. 90 tablet 3   Rivaroxaban (XARELTO) 15  MG TABS tablet Take 1 tablet (15 mg total) by mouth daily with supper. 90 tablet 1   sennosides-docusate sodium (SENOKOT-S) 8.6-50 MG tablet Take 2 tablets by mouth at bedtime.     traZODone (DESYREL) 50 MG tablet Take 2 tablet every night 60 tablet 5   vitamin B-12 (CYANOCOBALAMIN) 1000 MCG tablet Take 1,000 mcg by mouth daily.     No current facility-administered medications for this encounter.    Allergies  Allergen Reactions   Buspar [Buspirone] Other (See Comments)    Fatigue, body aches and shortness of breath    Latex Rash   Tape Rash and Other (See Comments)    USE PAPER      Social History   Socioeconomic History   Marital status: Widowed    Spouse name: Not on file   Number of children: 1   Years of education: Not on file   Highest education level: Not on file  Occupational History   Occupation: retired  Tobacco Use   Smoking status: Never   Smokeless tobacco: Never  Vaping Use   Vaping Use: Never used  Substance and Sexual Activity   Alcohol use: Not Currently    Alcohol/week: 2.0 standard drinks    Types: 2 Cans of beer per week   Drug use: No   Sexual activity: Not Currently  Other Topics Concern   Not on file  Social History Narrative   Full time. Married.    Right handed   Drinks caffeine   [facility]    Social Determinants of Health   Financial Resource Strain: High Risk   Difficulty of Paying Living Expenses: Hard  Food Insecurity: No Food Insecurity   Worried About [name] fundraiser in the Last Year: Never true   Ran Out of Food in the Last Year: Never true  Transportation Needs: No Transportation Needs   Lack of Transportation (Medical): No   Lack of Transportation (Non-Medical): No  Physical Activity: Insufficiently Active   Days of Exercise per Week: 2 days   Minutes of Exercise per Session: 60 min  Stress: No Stress Concern Present   Feeling of Stress : Not at all  Social Connections: Not on file  Intimate Partner Violence: Not on file      Family History  Problem Relation Age of Onset   Hypertension Father    Hyperlipidemia Father    Heart disease Father    Prostate cancer Father    Alzheimer's disease Mother    Hypertension Sister    Hyperlipidemia Sister    Colon cancer Neg Hx    Esophageal cancer Neg Hx    Rectal cancer Neg Hx    Stomach cancer Neg Hx     Vitals:   03/13/21 1401  BP: 104/70  Pulse: 88  SpO2: 100%  Weight: 59.7 kg (131 lb 9.6 oz)    Wt Readings from Last 3 Encounters:  03/13/21 59.7 kg (131 lb 9.6 oz)  02/27/21 56.2 kg (124 lb)  02/25/21 54.1 kg (119 lb 3.2 oz)    PHYSICAL EXAM: General:  elderly frail WM in [place]. No distress  HEENT: normal Neck: supple. no JVD. Carotids 2+ bilat; no bruits. No lymphadenopathy or thyromegaly appreciated. Cor: PMI nondisplaced. Irregularly irregular rhythm and rate. No rubs, gallops or murmurs. Lungs: clear Abdomen: soft, nontender, nondistended. No hepatosplenomegaly. No bruits or masses. Good bowel sounds. Extremities: no cyanosis, clubbing, rash, 1+ bilateral LE edema, cool distal extremities  Neuro: alert & oriented x  3, cranial nerves grossly intact. moves all 4 extremities w/o difficulty. Affect pleasant.   ASSESSMENT & PLAN: Chronic HFrEF , ICM  - Echo EF < 20% over the last few years.  RV normal on recent echo  - NYHA III (stable). GDMT limited by current condition. Frail and at high risk for falls. Also no Entresto/Spiro w/ borderline high K (5.0)  - Volume overloaded on exam and device interrogation. Poor urinary response to PO Lasix - concern for potential low output based on exam (cool distal extremities) - given age, fragility and other co morbidities (requires 24 hr care + memory issues), he is not a candidate for advanced therapies. We discussed again palliative care/hospice services but he is not interested. He is DNR Mark Clayton form signed). Desires to avoid further hospitalizations - Will stop Lasix - Start Torsemide 40 mg daily  - Check BMP today and again in 7 days  - Recommend he keep f/u w/ cardiology in 3 weeks  2. Permanent A fib  -Noted on Medtronic device. Rate controlled  -On xarelto 15 mg daily. Denies bleeding   3. CKD Stage II -Creatinine baseline 1.2-1.3  -Check BMET today and again in 7 days w/ diuretic change  4. H/O CVA  5. Macular Degenerative Disease 6. Memory Impairment 7. [place] He has completed GOLD DNR form and has that at home. We discussed Hospice but he and daughter both decline. SW assisting w/ home [facility] RN.   F/u w/ Cardiology in 3 weeks (appt scheduled).   Referred to HFSW (PCP, Medications, Transportation, ETOH Abuse, Drug Abuse, Insurance, Financial ): Yes  Refer to Pharmacy: No  Refer to Home Health: [facility] picking him up Refer to Advanced Heart Failure Clinic: No  Refer to General Cardiology: Yes Followed by Dr Marlou Porch and Dr Caryl Comes  Follow up w/ cardiology (followed by Dr. Marlou Porch and Dr. Caryl Comes)   Lyda Jester PA-C  2:15 PM

## 2021-03-13 ENCOUNTER — Other Ambulatory Visit: Payer: Self-pay

## 2021-03-13 ENCOUNTER — Ambulatory Visit (HOSPITAL_COMMUNITY)
Admission: RE | Admit: 2021-03-13 | Discharge: 2021-03-13 | Disposition: A | Discharge status: Home / Self Care | Payer: Medicare Other | Source: Ambulatory Visit | Attending: Cardiology | Admitting: Cardiology

## 2021-03-13 ENCOUNTER — Encounter (HOSPITAL_COMMUNITY): Payer: Self-pay

## 2021-03-13 VITALS — BP 104/70 | HR 88 | Wt 131.6 lb

## 2021-03-13 DIAGNOSIS — Z7901 Long term (current) use of anticoagulants: Secondary | ICD-10-CM | POA: Insufficient documentation

## 2021-03-13 DIAGNOSIS — I130 Hypertensive heart and chronic kidney disease with heart failure and stage 1 through stage 4 chronic kidney disease, or unspecified chronic kidney disease: Secondary | ICD-10-CM | POA: Diagnosis not present

## 2021-03-13 DIAGNOSIS — Z09 Encounter for follow-up examination after completed treatment for conditions other than malignant neoplasm: Secondary | ICD-10-CM | POA: Diagnosis not present

## 2021-03-13 DIAGNOSIS — Z955 Presence of coronary angioplasty implant and graft: Secondary | ICD-10-CM | POA: Insufficient documentation

## 2021-03-13 DIAGNOSIS — I4821 Permanent atrial fibrillation: Secondary | ICD-10-CM | POA: Diagnosis not present

## 2021-03-13 DIAGNOSIS — N182 Chronic kidney disease, stage 2 (mild): Secondary | ICD-10-CM | POA: Insufficient documentation

## 2021-03-13 DIAGNOSIS — E785 Hyperlipidemia, unspecified: Secondary | ICD-10-CM | POA: Diagnosis not present

## 2021-03-13 DIAGNOSIS — I5042 Chronic combined systolic (congestive) and diastolic (congestive) heart failure: Secondary | ICD-10-CM

## 2021-03-13 DIAGNOSIS — Z79899 Other long term (current) drug therapy: Secondary | ICD-10-CM | POA: Diagnosis not present

## 2021-03-13 DIAGNOSIS — R54 Age-related physical debility: Secondary | ICD-10-CM | POA: Insufficient documentation

## 2021-03-13 DIAGNOSIS — Z66 Do not resuscitate: Secondary | ICD-10-CM | POA: Insufficient documentation

## 2021-03-13 DIAGNOSIS — I2510 Atherosclerotic heart disease of native coronary artery without angina pectoris: Secondary | ICD-10-CM | POA: Diagnosis not present

## 2021-03-13 DIAGNOSIS — Z8673 Personal history of transient ischemic attack (TIA), and cerebral infarction without residual deficits: Secondary | ICD-10-CM | POA: Diagnosis not present

## 2021-03-13 DIAGNOSIS — Z951 Presence of aortocoronary bypass graft: Secondary | ICD-10-CM | POA: Insufficient documentation

## 2021-03-13 LAB — BASIC METABOLIC PANEL
Anion gap: 8 (ref 5–15)
BUN: 25 mg/dL — ABNORMAL HIGH (ref 8–23)
CO2: 26 mmol/L (ref 22–32)
Calcium: 9 mg/dL (ref 8.9–10.3)
Chloride: 104 mmol/L (ref 98–111)
Creatinine, Ser: 1.15 mg/dL (ref 0.61–1.24)
GFR, Estimated: >60 mL/min (ref >60)
Glucose, Bld: 96 mg/dL (ref 70–99)
Potassium: 5.1 mmol/L (ref 3.5–5.1)
Sodium: 138 mmol/L (ref 135–145)

## 2021-03-13 MED ORDER — TORSEMIDE 40 MG PO TABS: 40.0000 mg | ORAL_TABLET | Freq: Every day | ORAL | 3 refills | Status: DC

## 2021-03-13 NOTE — Addendum Note (Signed)
Encounter addended by: Shonna Chock, CMA on: 00/94/1791 3:45 PM  Actions taken: Pharmacy for encounter modified, Order list changed

## 2021-03-13 NOTE — TOC Progression Note (Signed)
Transition of Care [facility]) - Progression Note    Patient Details  Name: Mark Clayton MRN: [id number] Date of Birth: 06-04-1940  Transition of Care [facility]) CM/SW Contact  Zenon Mayo, RN Phone Number: 03/13/2021, 3:23 PM  Clinical Narrative:    NCM received call from [facility] with HF, stating patient says, the [facility] agency has not come out yet. NCM contacted [name] with [facility], she states yes this is their patient and for some reason it was closed but she will have someone out to see this patient as as possible and she will contact patient's maine contact person , his daughter [name].    Expected Discharge Plan: [place] Barriers to Discharge: Continued Medical Work up  Expected Discharge Plan and Services Expected Discharge Plan: [place] In-house Referral: NA Discharge Planning Services: CM Consult Post Acute Care Choice: Home Health, Resumption of Svcs/PTA Provider Living arrangements for the past 2 months: Single Family Home Expected Discharge Date: 02/19/21                 DME Agency: NA       HH Arranged: RN, Disease Management, PT HH Agency: [place] Date HH Agency Contacted: 02/09/21 Time [place]: 1536 Representative spoke with at [place]: [place]   Social Determinants of Health ([place]) Interventions Financial Strain Interventions: Other (Comment) (HF pharmacy to see) Housing Interventions: Intervention Not Indicated Transportation Interventions: Intervention Not Indicated  Readmission Risk Interventions No flowsheet data found.

## 2021-03-13 NOTE — Patient Instructions (Addendum)
Labs done today. We will contact you only if your labs are abnormal.  STOP taking Lasix  START Torsemide 40mg  (1 tablet) by mouth daily.   No other medication changes were made. Please continue all current medications as prescribed.  Thank you for allowing [place] to provide your heart failure care after your recent hospitalization. Please follow-up with 1 week for a lab only appointment(can be done in [place] at [place]) and in 1 month with General Cardiology

## 2021-03-15 ENCOUNTER — Other Ambulatory Visit: Payer: Self-pay | Admitting: Cardiology

## 2021-03-15 DIAGNOSIS — Z8673 Personal history of transient ischemic attack (TIA), and cerebral infarction without residual deficits: Secondary | ICD-10-CM | POA: Diagnosis not present

## 2021-03-15 DIAGNOSIS — I5023 Acute on chronic systolic (congestive) heart failure: Secondary | ICD-10-CM | POA: Diagnosis not present

## 2021-03-17 ENCOUNTER — Telehealth: Payer: Self-pay | Admitting: Internal Medicine

## 2021-03-17 DIAGNOSIS — H3530 Unspecified macular degeneration: Secondary | ICD-10-CM | POA: Diagnosis not present

## 2021-03-17 DIAGNOSIS — Z95810 Presence of automatic (implantable) cardiac defibrillator: Secondary | ICD-10-CM | POA: Diagnosis not present

## 2021-03-17 DIAGNOSIS — I255 Ischemic cardiomyopathy: Secondary | ICD-10-CM | POA: Diagnosis not present

## 2021-03-17 DIAGNOSIS — N1831 Chronic kidney disease, stage 3a: Secondary | ICD-10-CM | POA: Diagnosis not present

## 2021-03-17 DIAGNOSIS — I5043 Acute on chronic combined systolic (congestive) and diastolic (congestive) heart failure: Secondary | ICD-10-CM | POA: Diagnosis not present

## 2021-03-17 DIAGNOSIS — K219 Gastro-esophageal reflux disease without esophagitis: Secondary | ICD-10-CM | POA: Diagnosis not present

## 2021-03-17 DIAGNOSIS — Z7901 Long term (current) use of anticoagulants: Secondary | ICD-10-CM | POA: Diagnosis not present

## 2021-03-17 DIAGNOSIS — I951 Orthostatic hypotension: Secondary | ICD-10-CM | POA: Diagnosis not present

## 2021-03-17 DIAGNOSIS — Z955 Presence of coronary angioplasty implant and graft: Secondary | ICD-10-CM | POA: Diagnosis not present

## 2021-03-17 DIAGNOSIS — I130 Hypertensive heart and chronic kidney disease with heart failure and stage 1 through stage 4 chronic kidney disease, or unspecified chronic kidney disease: Secondary | ICD-10-CM | POA: Diagnosis not present

## 2021-03-17 DIAGNOSIS — I472 Ventricular tachycardia, unspecified: Secondary | ICD-10-CM | POA: Diagnosis not present

## 2021-03-17 DIAGNOSIS — C189 Malignant neoplasm of colon, unspecified: Secondary | ICD-10-CM | POA: Diagnosis not present

## 2021-03-17 DIAGNOSIS — I69351 Hemiplegia and hemiparesis following cerebral infarction affecting right dominant side: Secondary | ICD-10-CM | POA: Diagnosis not present

## 2021-03-17 DIAGNOSIS — R410 Disorientation, unspecified: Secondary | ICD-10-CM | POA: Diagnosis not present

## 2021-03-17 DIAGNOSIS — D631 Anemia in chronic kidney disease: Secondary | ICD-10-CM | POA: Diagnosis not present

## 2021-03-17 DIAGNOSIS — E785 Hyperlipidemia, unspecified: Secondary | ICD-10-CM | POA: Diagnosis not present

## 2021-03-17 DIAGNOSIS — Z951 Presence of aortocoronary bypass graft: Secondary | ICD-10-CM | POA: Diagnosis not present

## 2021-03-17 DIAGNOSIS — I4819 Other persistent atrial fibrillation: Secondary | ICD-10-CM | POA: Diagnosis not present

## 2021-03-17 NOTE — Telephone Encounter (Signed)
See below

## 2021-03-17 NOTE — Telephone Encounter (Signed)
Home Health verbal orders-caller/Agency: Mark/ [place] number: [phone] (Secure VM)  Requesting OT/PT/Skilled nursing/Social Work/Speech: PT  Frequency: 2x4 1x3 starting 03-17-2021

## 2021-03-18 ENCOUNTER — Other Ambulatory Visit (HOSPITAL_COMMUNITY): Payer: Self-pay

## 2021-03-18 MED ORDER — TORSEMIDE 20 MG PO TABS: 40.0000 mg | ORAL_TABLET | Freq: Two times a day (BID) | ORAL | 3 refills | Status: DC

## 2021-03-18 NOTE — Telephone Encounter (Signed)
Fine for verbals °

## 2021-03-18 NOTE — Telephone Encounter (Signed)
Spoke with Elta Gu[place] to give verbal orders. No other questions or concerns.

## 2021-03-19 ENCOUNTER — Other Ambulatory Visit: Payer: Self-pay | Admitting: Cardiology

## 2021-03-19 ENCOUNTER — Telehealth (HOSPITAL_COMMUNITY): Payer: Self-pay | Admitting: *Deleted

## 2021-03-19 DIAGNOSIS — I5042 Chronic combined systolic (congestive) and diastolic (congestive) heart failure: Secondary | ICD-10-CM | POA: Diagnosis not present

## 2021-03-19 NOTE — Telephone Encounter (Signed)
Lab order faxed to [facility], [place], [place] [id number].    FAX [phone]

## 2021-03-20 LAB — BASIC METABOLIC PANEL
BUN/Creatinine Ratio: 17 (ref 10–24)
BUN: 20 mg/dL (ref 8–27)
CO2: 27 mmol/L (ref 20–29)
Calcium: 8.9 mg/dL (ref 8.6–10.2)
Chloride: 102 mmol/L (ref 96–106)
Creatinine, Ser: 1.18 mg/dL (ref 0.76–1.27)
Glucose: 97 mg/dL (ref 70–99)
Potassium: 4.9 mmol/L (ref 3.5–5.2)
Sodium: 141 mmol/L (ref 134–144)
eGFR: 62 mL/min/{1.73_m2} (ref >59)

## 2021-03-21 DIAGNOSIS — D631 Anemia in chronic kidney disease: Secondary | ICD-10-CM | POA: Diagnosis not present

## 2021-03-21 DIAGNOSIS — Z7901 Long term (current) use of anticoagulants: Secondary | ICD-10-CM | POA: Diagnosis not present

## 2021-03-21 DIAGNOSIS — Z95810 Presence of automatic (implantable) cardiac defibrillator: Secondary | ICD-10-CM | POA: Diagnosis not present

## 2021-03-21 DIAGNOSIS — H3530 Unspecified macular degeneration: Secondary | ICD-10-CM | POA: Diagnosis not present

## 2021-03-21 DIAGNOSIS — R410 Disorientation, unspecified: Secondary | ICD-10-CM | POA: Diagnosis not present

## 2021-03-21 DIAGNOSIS — I951 Orthostatic hypotension: Secondary | ICD-10-CM | POA: Diagnosis not present

## 2021-03-21 DIAGNOSIS — I4819 Other persistent atrial fibrillation: Secondary | ICD-10-CM | POA: Diagnosis not present

## 2021-03-21 DIAGNOSIS — I5043 Acute on chronic combined systolic (congestive) and diastolic (congestive) heart failure: Secondary | ICD-10-CM | POA: Diagnosis not present

## 2021-03-21 DIAGNOSIS — Z955 Presence of coronary angioplasty implant and graft: Secondary | ICD-10-CM | POA: Diagnosis not present

## 2021-03-21 DIAGNOSIS — E785 Hyperlipidemia, unspecified: Secondary | ICD-10-CM | POA: Diagnosis not present

## 2021-03-21 DIAGNOSIS — I130 Hypertensive heart and chronic kidney disease with heart failure and stage 1 through stage 4 chronic kidney disease, or unspecified chronic kidney disease: Secondary | ICD-10-CM | POA: Diagnosis not present

## 2021-03-21 DIAGNOSIS — I69351 Hemiplegia and hemiparesis following cerebral infarction affecting right dominant side: Secondary | ICD-10-CM | POA: Diagnosis not present

## 2021-03-21 DIAGNOSIS — C189 Malignant neoplasm of colon, unspecified: Secondary | ICD-10-CM | POA: Diagnosis not present

## 2021-03-21 DIAGNOSIS — I255 Ischemic cardiomyopathy: Secondary | ICD-10-CM | POA: Diagnosis not present

## 2021-03-21 DIAGNOSIS — N1831 Chronic kidney disease, stage 3a: Secondary | ICD-10-CM | POA: Diagnosis not present

## 2021-03-21 DIAGNOSIS — Z951 Presence of aortocoronary bypass graft: Secondary | ICD-10-CM | POA: Diagnosis not present

## 2021-03-21 DIAGNOSIS — K219 Gastro-esophageal reflux disease without esophagitis: Secondary | ICD-10-CM | POA: Diagnosis not present

## 2021-03-21 DIAGNOSIS — I472 Ventricular tachycardia, unspecified: Secondary | ICD-10-CM | POA: Diagnosis not present

## 2021-03-26 ENCOUNTER — Telehealth: Payer: Self-pay

## 2021-03-26 DIAGNOSIS — Z7901 Long term (current) use of anticoagulants: Secondary | ICD-10-CM | POA: Diagnosis not present

## 2021-03-26 DIAGNOSIS — D631 Anemia in chronic kidney disease: Secondary | ICD-10-CM | POA: Diagnosis not present

## 2021-03-26 DIAGNOSIS — K219 Gastro-esophageal reflux disease without esophagitis: Secondary | ICD-10-CM | POA: Diagnosis not present

## 2021-03-26 DIAGNOSIS — I951 Orthostatic hypotension: Secondary | ICD-10-CM | POA: Diagnosis not present

## 2021-03-26 DIAGNOSIS — I472 Ventricular tachycardia, unspecified: Secondary | ICD-10-CM | POA: Diagnosis not present

## 2021-03-26 DIAGNOSIS — I5043 Acute on chronic combined systolic (congestive) and diastolic (congestive) heart failure: Secondary | ICD-10-CM | POA: Diagnosis not present

## 2021-03-26 DIAGNOSIS — N1831 Chronic kidney disease, stage 3a: Secondary | ICD-10-CM | POA: Diagnosis not present

## 2021-03-26 DIAGNOSIS — I130 Hypertensive heart and chronic kidney disease with heart failure and stage 1 through stage 4 chronic kidney disease, or unspecified chronic kidney disease: Secondary | ICD-10-CM | POA: Diagnosis not present

## 2021-03-26 DIAGNOSIS — Z95810 Presence of automatic (implantable) cardiac defibrillator: Secondary | ICD-10-CM | POA: Diagnosis not present

## 2021-03-26 DIAGNOSIS — H3530 Unspecified macular degeneration: Secondary | ICD-10-CM | POA: Diagnosis not present

## 2021-03-26 DIAGNOSIS — I69351 Hemiplegia and hemiparesis following cerebral infarction affecting right dominant side: Secondary | ICD-10-CM | POA: Diagnosis not present

## 2021-03-26 DIAGNOSIS — R410 Disorientation, unspecified: Secondary | ICD-10-CM | POA: Diagnosis not present

## 2021-03-26 DIAGNOSIS — I4819 Other persistent atrial fibrillation: Secondary | ICD-10-CM | POA: Diagnosis not present

## 2021-03-26 DIAGNOSIS — I255 Ischemic cardiomyopathy: Secondary | ICD-10-CM | POA: Diagnosis not present

## 2021-03-26 DIAGNOSIS — C189 Malignant neoplasm of colon, unspecified: Secondary | ICD-10-CM | POA: Diagnosis not present

## 2021-03-26 DIAGNOSIS — Z951 Presence of aortocoronary bypass graft: Secondary | ICD-10-CM | POA: Diagnosis not present

## 2021-03-26 DIAGNOSIS — E785 Hyperlipidemia, unspecified: Secondary | ICD-10-CM | POA: Diagnosis not present

## 2021-03-26 DIAGNOSIS — Z955 Presence of coronary angioplasty implant and graft: Secondary | ICD-10-CM | POA: Diagnosis not present

## 2021-03-26 NOTE — Progress Notes (Signed)
Chronic Care Management Pharmacy Assistant   Name: Mark Clayton  MRN: [id number] DOB: 03-Mar-1941  Reason for Encounter: Disease State-General Adherence Appointment: Telephone Feb.28, 2023 @ 9 am   Recent office visits:  02/25/21 Sharlet Salina (PCP) [facility] f/u. Chronic combined systolic and diastolic congestive heart failure. Modify Carvedilol 3.125 mg, Decrease Rivaroxaban to 15 mg.  01/27/21 Crawford (PCP) - Hernia. No med changes.  11/12/20 Crawford (PCP) - Chronic systolic heart failure. Start Baclofen 10 mg. D/c Benzonatate 200 mg.  10/24/20 Sharlet Salina (PCP) - Video Visit. Chronic cough. Start Promethazine-DM 6.25-15 mg.  Recent consult visits:  01/20/2021 Delice Lesch (Neurology) - [place] episodes. Start Trazodone 50 mg.  11/27/2020 Delice Lesch (Neurology) - Staring episodes. Start Depakote 250 mg.  11/26/20 Skains (Cardiology) - Ischemic cardiomyopathy. Change how you take Meclizine 25 mg. Increase Rivaroxaban 20 mg.  11/05/20 Skains (Cardiology) - Acute on chronic systolic heart failure. Take Lasix 60 mg & Potassium Chloride 20 meq 2x day.  11/01/20 Tillery,PA (Cardiology) - Chronic systolic heart failure. Increase 40 mg, Potassium Chloride 20 meq 2x daily. D/c Remeron.  10/23/20 Caryl Comes (Cardiology) - Congestive heart failure.   Hospital visits:  Medication Reconciliation was completed by comparing discharge summary, patients EMR and Pharmacy list, and upon discussion with patient.  2. Admitted to the hospital on 02/07/21 due to CHF. Discharge date was 11/23//22. Discharged from [facility].   CHANGE how you take: furosemide (LASIX) STOP taking: baclofen 10 MG tablet (LIORESAL)  Medications that remain the same after Hospital Discharge:??  -All other medications will remain the same.    Admitted to the hospital on 11/13/20 due to Stroke. Discharge date was 11/21/20. Discharged from [facility].   START taking: ferrous sulfate CHANGE how you take: carvedilol  (COREG) STOP taking: clopidogrel 75 MG tablet (PLAVIX) isosorbide mononitrate 30 MG 24 hr tablet (IMDUR) losartan 50 MG tablet (COZAAR)  Medications that remain the same after Hospital Discharge:??  -All other medications will remain the same.    Medications: Outpatient Encounter Medications as of 03/26/2021  Medication Sig   acetaminophen (TYLENOL) 500 MG tablet Take 500 mg by mouth every 6 (six) hours as needed for moderate pain or headache.   atorvastatin (LIPITOR) 80 MG tablet TAKE 1 TABLET BY MOUTH  DAILY   bisacodyl (DULCOLAX) 10 MG suppository Place 10 mg rectally See admin instructions. Qd on Monday and Thursday as needed for constipation   carvedilol (COREG) 3.125 MG tablet Take 1 tablet (3.125 mg total) by mouth 2 (two) times daily with a meal. Hold for sbp<100 or hr<60   divalproex (DEPAKOTE ER) 250 MG 24 hr tablet Take 2 tablets every night   ferrous sulfate 325 (65 FE) MG tablet Take 1 tablet (325 mg total) by mouth 2 (two) times daily with a meal. (Patient taking differently: Take 325 mg by mouth daily with breakfast.)   meclizine (ANTIVERT) 25 MG tablet Take 12.5 mg by mouth daily as needed for dizziness.   nitroGLYCERIN (NITROSTAT) 0.4 MG SL tablet DISSOLVE 1 TABLET UNDER THE TONGUE EVERY 5 MINUTES AS  NEEDED FOR CHEST PAIN. MAX  OF 3 TABLETS IN 15 MINUTES. CALL 911 IF PAIN PERSISTS.   pantoprazole (PROTONIX) 40 MG tablet TAKE 1 TABLET BY MOUTH  DAILY   polyethylene glycol (MIRALAX / GLYCOLAX) 17 g packet Take 17 g by mouth daily as needed for mild constipation.   potassium chloride (KLOR-CON M) 10 MEQ tablet Take 2 tablets (20 mEq total) by mouth every other day.  Rivaroxaban (XARELTO) 15 MG TABS tablet Take 1 tablet (15 mg total) by mouth daily with supper.   sennosides-docusate sodium (SENOKOT-S) 8.6-50 MG tablet Take 2 tablets by mouth at bedtime.   torsemide (DEMADEX) 20 MG tablet Take 2 tablets (40 mg total) by mouth 2 (two) times daily. Please cancel all previous  orders for current medication. Change in dosage or pill size.   traZODone (DESYREL) 50 MG tablet Take 2 tablet every night   vitamin B-12 (CYANOCOBALAMIN) 1000 MCG tablet Take 1,000 mcg by mouth daily.   No facility-administered encounter medications on file as of 03/26/2021.   Reviewed chart for medication changes and drug therapy problems ahead of medication adherence call.  Attempted to contact patient for medication review and health check, unable to reach patient, left voicemails to return call.  Care Gaps Colonoscopy - 05/03/2017 Diabetic Foot Exam - NA Mammogram - NA Ophthalmology - NA Dexa Scan - NA Annual Well Visit - NA Micro albumin - NA Hemoglobin A1c - NA  Star Rating Drugs: Losartan 50 mg 10/1020 90 ds Atorvastatin 80 mg 01/19/21 90 ds  Orinda Kenner, [place] Clinical Pharmacists Assistant [phone]

## 2021-03-27 ENCOUNTER — Other Ambulatory Visit: Payer: Self-pay

## 2021-03-28 DIAGNOSIS — I69351 Hemiplegia and hemiparesis following cerebral infarction affecting right dominant side: Secondary | ICD-10-CM | POA: Diagnosis not present

## 2021-03-28 DIAGNOSIS — Z955 Presence of coronary angioplasty implant and graft: Secondary | ICD-10-CM | POA: Diagnosis not present

## 2021-03-28 DIAGNOSIS — R410 Disorientation, unspecified: Secondary | ICD-10-CM | POA: Diagnosis not present

## 2021-03-28 DIAGNOSIS — I951 Orthostatic hypotension: Secondary | ICD-10-CM | POA: Diagnosis not present

## 2021-03-28 DIAGNOSIS — D631 Anemia in chronic kidney disease: Secondary | ICD-10-CM | POA: Diagnosis not present

## 2021-03-28 DIAGNOSIS — Z7901 Long term (current) use of anticoagulants: Secondary | ICD-10-CM | POA: Diagnosis not present

## 2021-03-28 DIAGNOSIS — C189 Malignant neoplasm of colon, unspecified: Secondary | ICD-10-CM | POA: Diagnosis not present

## 2021-03-28 DIAGNOSIS — E785 Hyperlipidemia, unspecified: Secondary | ICD-10-CM | POA: Diagnosis not present

## 2021-03-28 DIAGNOSIS — K219 Gastro-esophageal reflux disease without esophagitis: Secondary | ICD-10-CM | POA: Diagnosis not present

## 2021-03-28 DIAGNOSIS — Z95810 Presence of automatic (implantable) cardiac defibrillator: Secondary | ICD-10-CM | POA: Diagnosis not present

## 2021-03-28 DIAGNOSIS — I255 Ischemic cardiomyopathy: Secondary | ICD-10-CM | POA: Diagnosis not present

## 2021-03-28 DIAGNOSIS — I472 Ventricular tachycardia, unspecified: Secondary | ICD-10-CM | POA: Diagnosis not present

## 2021-03-28 DIAGNOSIS — Z951 Presence of aortocoronary bypass graft: Secondary | ICD-10-CM | POA: Diagnosis not present

## 2021-03-28 DIAGNOSIS — I4819 Other persistent atrial fibrillation: Secondary | ICD-10-CM | POA: Diagnosis not present

## 2021-03-28 DIAGNOSIS — I5043 Acute on chronic combined systolic (congestive) and diastolic (congestive) heart failure: Secondary | ICD-10-CM | POA: Diagnosis not present

## 2021-03-28 DIAGNOSIS — N1831 Chronic kidney disease, stage 3a: Secondary | ICD-10-CM | POA: Diagnosis not present

## 2021-03-28 DIAGNOSIS — H3530 Unspecified macular degeneration: Secondary | ICD-10-CM | POA: Diagnosis not present

## 2021-03-28 DIAGNOSIS — I130 Hypertensive heart and chronic kidney disease with heart failure and stage 1 through stage 4 chronic kidney disease, or unspecified chronic kidney disease: Secondary | ICD-10-CM | POA: Diagnosis not present

## 2021-04-01 ENCOUNTER — Ambulatory Visit (INDEPENDENT_AMBULATORY_CARE_PROVIDER_SITE_OTHER): Payer: Medicare Other

## 2021-04-01 DIAGNOSIS — Z955 Presence of coronary angioplasty implant and graft: Secondary | ICD-10-CM | POA: Diagnosis not present

## 2021-04-01 DIAGNOSIS — Z951 Presence of aortocoronary bypass graft: Secondary | ICD-10-CM | POA: Diagnosis not present

## 2021-04-01 DIAGNOSIS — I5043 Acute on chronic combined systolic (congestive) and diastolic (congestive) heart failure: Secondary | ICD-10-CM | POA: Diagnosis not present

## 2021-04-01 DIAGNOSIS — D631 Anemia in chronic kidney disease: Secondary | ICD-10-CM | POA: Diagnosis not present

## 2021-04-01 DIAGNOSIS — K219 Gastro-esophageal reflux disease without esophagitis: Secondary | ICD-10-CM | POA: Diagnosis not present

## 2021-04-01 DIAGNOSIS — I255 Ischemic cardiomyopathy: Secondary | ICD-10-CM | POA: Diagnosis not present

## 2021-04-01 DIAGNOSIS — I4819 Other persistent atrial fibrillation: Secondary | ICD-10-CM | POA: Diagnosis not present

## 2021-04-01 DIAGNOSIS — H3530 Unspecified macular degeneration: Secondary | ICD-10-CM | POA: Diagnosis not present

## 2021-04-01 DIAGNOSIS — I5042 Chronic combined systolic (congestive) and diastolic (congestive) heart failure: Secondary | ICD-10-CM

## 2021-04-01 DIAGNOSIS — Z95810 Presence of automatic (implantable) cardiac defibrillator: Secondary | ICD-10-CM

## 2021-04-01 DIAGNOSIS — E785 Hyperlipidemia, unspecified: Secondary | ICD-10-CM | POA: Diagnosis not present

## 2021-04-01 DIAGNOSIS — I472 Ventricular tachycardia, unspecified: Secondary | ICD-10-CM | POA: Diagnosis not present

## 2021-04-01 DIAGNOSIS — C189 Malignant neoplasm of colon, unspecified: Secondary | ICD-10-CM | POA: Diagnosis not present

## 2021-04-01 DIAGNOSIS — I951 Orthostatic hypotension: Secondary | ICD-10-CM | POA: Diagnosis not present

## 2021-04-01 DIAGNOSIS — I69351 Hemiplegia and hemiparesis following cerebral infarction affecting right dominant side: Secondary | ICD-10-CM | POA: Diagnosis not present

## 2021-04-01 DIAGNOSIS — N1831 Chronic kidney disease, stage 3a: Secondary | ICD-10-CM | POA: Diagnosis not present

## 2021-04-01 DIAGNOSIS — Z7901 Long term (current) use of anticoagulants: Secondary | ICD-10-CM | POA: Diagnosis not present

## 2021-04-01 DIAGNOSIS — I130 Hypertensive heart and chronic kidney disease with heart failure and stage 1 through stage 4 chronic kidney disease, or unspecified chronic kidney disease: Secondary | ICD-10-CM | POA: Diagnosis not present

## 2021-04-01 DIAGNOSIS — R410 Disorientation, unspecified: Secondary | ICD-10-CM | POA: Diagnosis not present

## 2021-04-02 ENCOUNTER — Ambulatory Visit: Payer: Medicare Other | Admitting: Neurology

## 2021-04-02 ENCOUNTER — Telehealth: Payer: Self-pay

## 2021-04-02 NOTE — Telephone Encounter (Signed)
Remote ICM transmission received.  Attempted call to daughter Rashon Westrup regarding ICM remote transmission and left detailed message per DPR.  Advised to return call for any fluid symptoms or questions. Next ICM remote transmission scheduled 05/05/2021.

## 2021-04-02 NOTE — Progress Notes (Signed)
EPIC Encounter for ICM Monitoring  Patient Name: Mark Clayton is a 81 y.o. male Date: 04/02/2021 Primary Care Physican: [name], MD Cardiologist: Marlou Porch Electrophysiologist: Caryl Comes 02/27/2021 Weight: 124 lbs   Time in AF:  20.9 hr/day (87.1%) (taking Xarelto)          Attempted call to daughter Armany Mano per DPR and unable to reach.  Left detailed message per DPR regarding transmission. Transmission reviewed.          Optivol Thoracic impedance suggesting normal fluid levels since 02/28/21.   Prescribed:  Furosemide 20 mg 2 tablets (40 mg total) by mouth twice a day.  Potassium 10 mEq take 2 tablets (20 mEq total) every other day   Labs: 03/19/2021 Creatinine 1.18, BUN 20, Potassium 4.9, Sodium 141  03/13/2021 Creatinine 1.15, BUN 25, Potassium 5.1, Sodium 138, GFR >60  02/27/2021 Creatinine 0.99, BUN 25, Potassium 5.0, Sodium 138, GFR >60 02/19/2021 Creatinine 1.22, BUN 30, Potassium 4.4, Sodium 135, GFR 60  02/18/2021 Creatinine 1.32, BUN 32, Potassium 5.5, Sodium 135, GFR 55  02/17/2021 Creatinine 1.20, BUN 26, Potassium 4.6, Sodium 135, GFR >60  02/16/2021 Creatinine 1.19, BUN 20, Potassium 4.3, Sodium 133, GFR >60  02/15/2021 Creatinine 1.13, BUN 18, Potassium 3.7, Sodium 136, GFR >60  02/14/2021 Creatinine 1.30, BUN 22, Potassium 3.3, Sodium 134, GFR 56  02/13/2021 Creatinine 1.37, BUN 26, Potassium 4.0, Sodium 136, GFR 52  02/12/2021 Creatinine 1.32, BUN 26, Potassium 3.8, Sodium 132, GFR 52  02/11/2021 Creatinine 1.44, BUN 23, Potassium 4.6, Sodium 136, GFR 49  02/10/2021 Creatinine 1.21, BUN 21, Potassium 3.5, Sodium 134, GFR >60 02/09/2021 Creatinine 1.24, BUN 21, Potassium 3.5, Sodium 131, GFR 59  02/07/2021 Creatinine 1.37, BUN 26, Potassium 5.1, Sodium 134, GFR 52  01/27/2021 Creatinine 1.09, BUN 16, Potassium 3.7, Sodium 135  A complete set of results can be found in Results Review.   Recommendations:  Left voice mail with ICM number and encouraged to  call if experiencing any fluid symptoms.   Follow-up plan: ICM clinic phone appointment on 05/05/2021.  91 day device clinic remote transmission 04/23/2021.     EP/Cardiology Office Visits: Recall 01/26/2021 with Dr Marlou Porch.  04/03/2021 with Oda Kilts, PA.     Copy of ICM check sent to Dr. Caryl Comes.  3 month ICM trend: 04/01/2021.    12-14 Month ICM trend:     Rosalene Billings, RN 04/02/2021 4:54 PM

## 2021-04-03 ENCOUNTER — Encounter: Payer: Medicare Other | Admitting: Student

## 2021-04-08 DIAGNOSIS — I472 Ventricular tachycardia, unspecified: Secondary | ICD-10-CM | POA: Diagnosis not present

## 2021-04-08 DIAGNOSIS — C189 Malignant neoplasm of colon, unspecified: Secondary | ICD-10-CM | POA: Diagnosis not present

## 2021-04-08 DIAGNOSIS — I130 Hypertensive heart and chronic kidney disease with heart failure and stage 1 through stage 4 chronic kidney disease, or unspecified chronic kidney disease: Secondary | ICD-10-CM | POA: Diagnosis not present

## 2021-04-08 DIAGNOSIS — D631 Anemia in chronic kidney disease: Secondary | ICD-10-CM | POA: Diagnosis not present

## 2021-04-08 DIAGNOSIS — I255 Ischemic cardiomyopathy: Secondary | ICD-10-CM | POA: Diagnosis not present

## 2021-04-08 DIAGNOSIS — I69351 Hemiplegia and hemiparesis following cerebral infarction affecting right dominant side: Secondary | ICD-10-CM | POA: Diagnosis not present

## 2021-04-08 DIAGNOSIS — Z95810 Presence of automatic (implantable) cardiac defibrillator: Secondary | ICD-10-CM | POA: Diagnosis not present

## 2021-04-08 DIAGNOSIS — R410 Disorientation, unspecified: Secondary | ICD-10-CM | POA: Diagnosis not present

## 2021-04-08 DIAGNOSIS — H3530 Unspecified macular degeneration: Secondary | ICD-10-CM | POA: Diagnosis not present

## 2021-04-08 DIAGNOSIS — Z951 Presence of aortocoronary bypass graft: Secondary | ICD-10-CM | POA: Diagnosis not present

## 2021-04-08 DIAGNOSIS — N1831 Chronic kidney disease, stage 3a: Secondary | ICD-10-CM | POA: Diagnosis not present

## 2021-04-08 DIAGNOSIS — I4819 Other persistent atrial fibrillation: Secondary | ICD-10-CM | POA: Diagnosis not present

## 2021-04-08 DIAGNOSIS — I5043 Acute on chronic combined systolic (congestive) and diastolic (congestive) heart failure: Secondary | ICD-10-CM | POA: Diagnosis not present

## 2021-04-08 DIAGNOSIS — Z955 Presence of coronary angioplasty implant and graft: Secondary | ICD-10-CM | POA: Diagnosis not present

## 2021-04-08 DIAGNOSIS — K219 Gastro-esophageal reflux disease without esophagitis: Secondary | ICD-10-CM | POA: Diagnosis not present

## 2021-04-08 DIAGNOSIS — E785 Hyperlipidemia, unspecified: Secondary | ICD-10-CM | POA: Diagnosis not present

## 2021-04-08 DIAGNOSIS — I951 Orthostatic hypotension: Secondary | ICD-10-CM | POA: Diagnosis not present

## 2021-04-08 DIAGNOSIS — Z7901 Long term (current) use of anticoagulants: Secondary | ICD-10-CM | POA: Diagnosis not present

## 2021-04-11 ENCOUNTER — Telehealth: Payer: Self-pay | Admitting: Internal Medicine

## 2021-04-11 DIAGNOSIS — Z955 Presence of coronary angioplasty implant and graft: Secondary | ICD-10-CM | POA: Diagnosis not present

## 2021-04-11 DIAGNOSIS — R410 Disorientation, unspecified: Secondary | ICD-10-CM | POA: Diagnosis not present

## 2021-04-11 DIAGNOSIS — Z7901 Long term (current) use of anticoagulants: Secondary | ICD-10-CM | POA: Diagnosis not present

## 2021-04-11 DIAGNOSIS — Z951 Presence of aortocoronary bypass graft: Secondary | ICD-10-CM | POA: Diagnosis not present

## 2021-04-11 DIAGNOSIS — I4819 Other persistent atrial fibrillation: Secondary | ICD-10-CM | POA: Diagnosis not present

## 2021-04-11 DIAGNOSIS — I5043 Acute on chronic combined systolic (congestive) and diastolic (congestive) heart failure: Secondary | ICD-10-CM | POA: Diagnosis not present

## 2021-04-11 DIAGNOSIS — N1831 Chronic kidney disease, stage 3a: Secondary | ICD-10-CM | POA: Diagnosis not present

## 2021-04-11 DIAGNOSIS — I472 Ventricular tachycardia, unspecified: Secondary | ICD-10-CM | POA: Diagnosis not present

## 2021-04-11 DIAGNOSIS — I255 Ischemic cardiomyopathy: Secondary | ICD-10-CM | POA: Diagnosis not present

## 2021-04-11 DIAGNOSIS — Z95810 Presence of automatic (implantable) cardiac defibrillator: Secondary | ICD-10-CM | POA: Diagnosis not present

## 2021-04-11 DIAGNOSIS — K219 Gastro-esophageal reflux disease without esophagitis: Secondary | ICD-10-CM | POA: Diagnosis not present

## 2021-04-11 DIAGNOSIS — I130 Hypertensive heart and chronic kidney disease with heart failure and stage 1 through stage 4 chronic kidney disease, or unspecified chronic kidney disease: Secondary | ICD-10-CM | POA: Diagnosis not present

## 2021-04-11 DIAGNOSIS — I69351 Hemiplegia and hemiparesis following cerebral infarction affecting right dominant side: Secondary | ICD-10-CM | POA: Diagnosis not present

## 2021-04-11 DIAGNOSIS — D631 Anemia in chronic kidney disease: Secondary | ICD-10-CM | POA: Diagnosis not present

## 2021-04-11 DIAGNOSIS — I951 Orthostatic hypotension: Secondary | ICD-10-CM | POA: Diagnosis not present

## 2021-04-11 DIAGNOSIS — C189 Malignant neoplasm of colon, unspecified: Secondary | ICD-10-CM | POA: Diagnosis not present

## 2021-04-11 DIAGNOSIS — H3530 Unspecified macular degeneration: Secondary | ICD-10-CM | POA: Diagnosis not present

## 2021-04-11 DIAGNOSIS — E785 Hyperlipidemia, unspecified: Secondary | ICD-10-CM | POA: Diagnosis not present

## 2021-04-11 NOTE — Telephone Encounter (Signed)
Mark Clayton has called and is requesting verbals to complete nurse evaluation, and to clarify that their nurse is to perform disease process education for heart failure, and medication management.     Callback #- [id number]

## 2021-04-11 NOTE — Telephone Encounter (Signed)
Okay for verbals °

## 2021-04-11 NOTE — Telephone Encounter (Signed)
See below

## 2021-04-14 NOTE — Telephone Encounter (Signed)
[employer]. Answering service answered. She stated that she was going to page to Aaron Edelman to give our office a call. Office number was provided.   If Aaron Edelman calls back please give verbal ok for verbal orders.

## 2021-04-15 DIAGNOSIS — I5023 Acute on chronic systolic (congestive) heart failure: Secondary | ICD-10-CM | POA: Diagnosis not present

## 2021-04-15 DIAGNOSIS — Z8673 Personal history of transient ischemic attack (TIA), and cerebral infarction without residual deficits: Secondary | ICD-10-CM | POA: Diagnosis not present

## 2021-04-16 DIAGNOSIS — E785 Hyperlipidemia, unspecified: Secondary | ICD-10-CM | POA: Diagnosis not present

## 2021-04-16 DIAGNOSIS — Z951 Presence of aortocoronary bypass graft: Secondary | ICD-10-CM | POA: Diagnosis not present

## 2021-04-16 DIAGNOSIS — I5043 Acute on chronic combined systolic (congestive) and diastolic (congestive) heart failure: Secondary | ICD-10-CM | POA: Diagnosis not present

## 2021-04-16 DIAGNOSIS — Z955 Presence of coronary angioplasty implant and graft: Secondary | ICD-10-CM | POA: Diagnosis not present

## 2021-04-16 DIAGNOSIS — I951 Orthostatic hypotension: Secondary | ICD-10-CM | POA: Diagnosis not present

## 2021-04-16 DIAGNOSIS — Z7901 Long term (current) use of anticoagulants: Secondary | ICD-10-CM | POA: Diagnosis not present

## 2021-04-16 DIAGNOSIS — R410 Disorientation, unspecified: Secondary | ICD-10-CM | POA: Diagnosis not present

## 2021-04-16 DIAGNOSIS — K219 Gastro-esophageal reflux disease without esophagitis: Secondary | ICD-10-CM | POA: Diagnosis not present

## 2021-04-16 DIAGNOSIS — C189 Malignant neoplasm of colon, unspecified: Secondary | ICD-10-CM | POA: Diagnosis not present

## 2021-04-16 DIAGNOSIS — Z95810 Presence of automatic (implantable) cardiac defibrillator: Secondary | ICD-10-CM | POA: Diagnosis not present

## 2021-04-16 DIAGNOSIS — I69351 Hemiplegia and hemiparesis following cerebral infarction affecting right dominant side: Secondary | ICD-10-CM | POA: Diagnosis not present

## 2021-04-16 DIAGNOSIS — I130 Hypertensive heart and chronic kidney disease with heart failure and stage 1 through stage 4 chronic kidney disease, or unspecified chronic kidney disease: Secondary | ICD-10-CM | POA: Diagnosis not present

## 2021-04-16 DIAGNOSIS — H3530 Unspecified macular degeneration: Secondary | ICD-10-CM | POA: Diagnosis not present

## 2021-04-16 DIAGNOSIS — I472 Ventricular tachycardia, unspecified: Secondary | ICD-10-CM | POA: Diagnosis not present

## 2021-04-16 DIAGNOSIS — I4819 Other persistent atrial fibrillation: Secondary | ICD-10-CM | POA: Diagnosis not present

## 2021-04-16 DIAGNOSIS — I255 Ischemic cardiomyopathy: Secondary | ICD-10-CM | POA: Diagnosis not present

## 2021-04-16 DIAGNOSIS — N1831 Chronic kidney disease, stage 3a: Secondary | ICD-10-CM | POA: Diagnosis not present

## 2021-04-16 DIAGNOSIS — D631 Anemia in chronic kidney disease: Secondary | ICD-10-CM | POA: Diagnosis not present

## 2021-04-18 ENCOUNTER — Telehealth: Payer: Self-pay | Admitting: Neurology

## 2021-04-18 ENCOUNTER — Other Ambulatory Visit: Payer: Self-pay

## 2021-04-18 ENCOUNTER — Ambulatory Visit: Payer: Medicare Other | Admitting: Neurology

## 2021-04-18 MED ORDER — TRAZODONE HCL 50 MG PO TABS: ORAL_TABLET | ORAL | 5 refills | Status: DC

## 2021-04-18 NOTE — Telephone Encounter (Signed)
Refill sent in for pt. 

## 2021-04-18 NOTE — Telephone Encounter (Signed)
Ok to refill. He was supposed to come today for appt but we cancelled, ok to send 6 mos refill, thanks

## 2021-04-18 NOTE — Telephone Encounter (Signed)
1. Which medications need refilled? (List name and dosage, if known) trazodone  2. Which pharmacy/location is medication to be sent to? (include street and city if local pharmacy) [place], [place]  She stated she couldn't make a follow up appointment, she didn't know the pt's daughter's schedule

## 2021-04-22 DIAGNOSIS — I951 Orthostatic hypotension: Secondary | ICD-10-CM | POA: Diagnosis not present

## 2021-04-22 DIAGNOSIS — Z7901 Long term (current) use of anticoagulants: Secondary | ICD-10-CM | POA: Diagnosis not present

## 2021-04-22 DIAGNOSIS — I69351 Hemiplegia and hemiparesis following cerebral infarction affecting right dominant side: Secondary | ICD-10-CM | POA: Diagnosis not present

## 2021-04-22 DIAGNOSIS — I255 Ischemic cardiomyopathy: Secondary | ICD-10-CM | POA: Diagnosis not present

## 2021-04-22 DIAGNOSIS — Z95810 Presence of automatic (implantable) cardiac defibrillator: Secondary | ICD-10-CM | POA: Diagnosis not present

## 2021-04-22 DIAGNOSIS — D631 Anemia in chronic kidney disease: Secondary | ICD-10-CM | POA: Diagnosis not present

## 2021-04-22 DIAGNOSIS — N1831 Chronic kidney disease, stage 3a: Secondary | ICD-10-CM | POA: Diagnosis not present

## 2021-04-22 DIAGNOSIS — I130 Hypertensive heart and chronic kidney disease with heart failure and stage 1 through stage 4 chronic kidney disease, or unspecified chronic kidney disease: Secondary | ICD-10-CM | POA: Diagnosis not present

## 2021-04-22 DIAGNOSIS — E785 Hyperlipidemia, unspecified: Secondary | ICD-10-CM | POA: Diagnosis not present

## 2021-04-22 DIAGNOSIS — C189 Malignant neoplasm of colon, unspecified: Secondary | ICD-10-CM | POA: Diagnosis not present

## 2021-04-22 DIAGNOSIS — Z951 Presence of aortocoronary bypass graft: Secondary | ICD-10-CM | POA: Diagnosis not present

## 2021-04-22 DIAGNOSIS — R410 Disorientation, unspecified: Secondary | ICD-10-CM | POA: Diagnosis not present

## 2021-04-22 DIAGNOSIS — I4819 Other persistent atrial fibrillation: Secondary | ICD-10-CM | POA: Diagnosis not present

## 2021-04-22 DIAGNOSIS — Z955 Presence of coronary angioplasty implant and graft: Secondary | ICD-10-CM | POA: Diagnosis not present

## 2021-04-22 DIAGNOSIS — H3530 Unspecified macular degeneration: Secondary | ICD-10-CM | POA: Diagnosis not present

## 2021-04-22 DIAGNOSIS — K219 Gastro-esophageal reflux disease without esophagitis: Secondary | ICD-10-CM | POA: Diagnosis not present

## 2021-04-22 DIAGNOSIS — I472 Ventricular tachycardia, unspecified: Secondary | ICD-10-CM | POA: Diagnosis not present

## 2021-04-22 DIAGNOSIS — I5043 Acute on chronic combined systolic (congestive) and diastolic (congestive) heart failure: Secondary | ICD-10-CM | POA: Diagnosis not present

## 2021-04-23 ENCOUNTER — Ambulatory Visit (INDEPENDENT_AMBULATORY_CARE_PROVIDER_SITE_OTHER): Payer: Medicare Other

## 2021-04-23 DIAGNOSIS — I255 Ischemic cardiomyopathy: Secondary | ICD-10-CM

## 2021-04-23 LAB — CUP PACEART REMOTE DEVICE CHECK
Battery Remaining Longevity: 116 mo
Battery Voltage: 3 V
Brady Statistic RV Percent Paced: 0.06 %
Date Time Interrogation Session: 20230125142507
HighPow Impedance: 39 Ohm
HighPow Impedance: 50 Ohm
Implantable Lead Implant Date: 20041020
Implantable Lead Implant Date: 20100913
Implantable Lead Location: 753860
Implantable Lead Location: 753860
Implantable Lead Model: 5076
Implantable Lead Model: 6949
Implantable Pulse Generator Implant Date: 20200909
Lead Channel Impedance Value: 285 Ohm
Lead Channel Impedance Value: 361 Ohm
Lead Channel Pacing Threshold Amplitude: 1.375 V
Lead Channel Pacing Threshold Pulse Width: 0.4 ms
Lead Channel Sensing Intrinsic Amplitude: 6.25 mV
Lead Channel Sensing Intrinsic Amplitude: 6.25 mV
Lead Channel Setting Pacing Amplitude: 2.5 V
Lead Channel Setting Pacing Pulse Width: 0.8 ms
Lead Channel Setting Sensing Sensitivity: 0.45 mV

## 2021-04-30 ENCOUNTER — Encounter: Payer: Self-pay | Admitting: Student

## 2021-04-30 ENCOUNTER — Other Ambulatory Visit: Payer: Self-pay

## 2021-04-30 ENCOUNTER — Ambulatory Visit: Payer: Medicare Other | Admitting: Student

## 2021-04-30 VITALS — BP 100/62 | HR 69 | Ht 72.0 in | Wt 137.0 lb

## 2021-04-30 DIAGNOSIS — Z95810 Presence of automatic (implantable) cardiac defibrillator: Secondary | ICD-10-CM

## 2021-04-30 DIAGNOSIS — I255 Ischemic cardiomyopathy: Secondary | ICD-10-CM

## 2021-04-30 DIAGNOSIS — I4819 Other persistent atrial fibrillation: Secondary | ICD-10-CM

## 2021-04-30 DIAGNOSIS — I5022 Chronic systolic (congestive) heart failure: Secondary | ICD-10-CM | POA: Diagnosis not present

## 2021-04-30 DIAGNOSIS — I5042 Chronic combined systolic (congestive) and diastolic (congestive) heart failure: Secondary | ICD-10-CM

## 2021-04-30 LAB — CUP PACEART INCLINIC DEVICE CHECK
Battery Remaining Longevity: 116 mo
Battery Voltage: 3 V
Brady Statistic RV Percent Paced: 0.04 %
Date Time Interrogation Session: 20230201125139
HighPow Impedance: 39 Ohm
HighPow Impedance: 49 Ohm
Implantable Lead Implant Date: 20041020
Implantable Lead Implant Date: 20100913
Implantable Lead Location: 753860
Implantable Lead Location: 753860
Implantable Lead Model: 5076
Implantable Lead Model: 6949
Implantable Pulse Generator Implant Date: 20200909
Lead Channel Impedance Value: 285 Ohm
Lead Channel Impedance Value: 399 Ohm
Lead Channel Pacing Threshold Amplitude: 1.625 V
Lead Channel Pacing Threshold Pulse Width: 0.4 ms
Lead Channel Sensing Intrinsic Amplitude: 6.25 mV
Lead Channel Sensing Intrinsic Amplitude: 6.25 mV
Lead Channel Setting Pacing Amplitude: 2.5 V
Lead Channel Setting Pacing Pulse Width: 0.8 ms
Lead Channel Setting Sensing Sensitivity: 0.45 mV

## 2021-04-30 LAB — BASIC METABOLIC PANEL
BUN/Creatinine Ratio: 17 (ref 10–24)
BUN: 19 mg/dL (ref 8–27)
CO2: 22 mmol/L (ref 20–29)
Calcium: 9.2 mg/dL (ref 8.6–10.2)
Chloride: 92 mmol/L — ABNORMAL LOW (ref 96–106)
Creatinine, Ser: 1.13 mg/dL (ref 0.76–1.27)
Glucose: 74 mg/dL (ref 70–99)
Potassium: 4.6 mmol/L (ref 3.5–5.2)
Sodium: 128 mmol/L — ABNORMAL LOW (ref 134–144)
eGFR: 66 mL/min/{1.73_m2} (ref >59)

## 2021-04-30 LAB — CBC
Hematocrit: 36 % — ABNORMAL LOW (ref 37.5–51.0)
Hemoglobin: 12.5 g/dL — ABNORMAL LOW (ref 13.0–17.7)
MCH: 32.1 pg (ref 26.6–33.0)
MCHC: 34.7 g/dL (ref 31.5–35.7)
MCV: 92 fL (ref 79–97)
Platelets: 167 10*3/uL (ref 150–450)
RBC: 3.9 x10E6/uL — ABNORMAL LOW (ref 4.14–5.80)
RDW: 14.9 % (ref 11.6–15.4)
WBC: 5.1 10*3/uL (ref 3.4–10.8)

## 2021-04-30 NOTE — Patient Instructions (Signed)
Medication Instructions:  Your physician recommends that you continue on your current medications as directed. Please refer to the Current Medication list given to you today.  *If you need a refill on your cardiac medications before your next appointment, please call your pharmacy*   Lab Work: TODAY: BMET, CBC  If you have labs (blood work) drawn today and your tests are completely normal, you will receive your results only by: [place] (if you have MyChart) OR A paper copy in the mail If you have any lab test that is abnormal or we need to change your treatment, we will call you to review the results.   Follow-Up: At [facility], you and your health needs are our priority.  As part of our continuing mission to provide you with exceptional heart care, we have created designated Provider Care Teams.  These Care Teams include your primary Cardiologist (physician) and Advanced Practice Providers (APPs -  Physician Assistants and Nurse Practitioners) who all work together to provide you with the care you need, when you need it.   Your next appointment:   6 month(s)  The format for your next appointment:   In Person  Provider:   You may see Virl Axe, MD or one of the following Advanced Practice Providers on your designated Care Team:   Tommye Standard, [place] "[facility]" [place], [place]

## 2021-04-30 NOTE — Progress Notes (Signed)
Electrophysiology Office Note Date: 04/30/2021  ID:  Feliz, Lincoln 07-Jul-1940, MRN [id number]  PCP: [name], MD Primary Cardiologist: Candee Furbish, MD Electrophysiologist: Virl Axe, MD   CC: Routine ICD follow-up  BACH ROCCHI is a 81 y.o. male seen today for Virl Axe, MD for routine electrophysiology followup.  Since last being seen in our clinic the patient reports doing about the same. Seen in HF TOC clinic and changed to torsemide. Chronic edema waxes and wanes. No further ICD therapies. Remains SOB with minimal activity. Can get around a little with his walker.   Device History: MDTsingle chamber ICD,  original implant (0092 lead was 2004), gen change and new RV lead, 12/10/08 : Gen change 11/2018  Past Medical History:  Diagnosis Date   AICD (automatic cardioverter/defibrillator) present 01/17/2003   Medtronic Maximo 7232CX ICD, serial #[id number]   Anemia 02/06/2011   takes oral iron   Arthritis    hands, knees   CAD (coronary artery disease) 2003   h/o MI and CABG in 2003. // s/p DES to [place] in 08/2014 // s/p DES to S-RPDA in 10/2018 (Cath w patent RCA stent, L-LAD and S-RI/OM2/OM3; jump graft from RPDA to RPL w CTO)   Cancer of sigmoid colon ([place]) 2012   a. s/p colon surgery.   Carotid bruit    Cough    Exudative age-related macular degeneration of left eye with active choroidal neovascularization ([place]) 07/12/2019   Exudative age-related macular degeneration, right eye, with inactive choroidal neovascularization ([place]) 07/12/2019   GERD (gastroesophageal reflux disease) 02/06/2011   HFrEF (heart failure with reduced ejection fraction) ([place])    a. EF 20% in 2014; b. 08/2017 Echo: EF 20-25%, diff HK, Gr3 DD. Triv AI. Mod MR. Sev dil LA. Mildly dil RV w/ mildly reduced RV fxn. Mildly dil RA. Mod TR. PASP 87mHg. // Echo 3/21: EF < 20, mild MR, mild AI, mod LAE, mod reduced RVSF   HTN (hypertension)    Hyperlipidemia    Ischemic cardiomyopathy    a.  EF 20% in 2014. (Master study EF >20%); b. 08/2017 Echo: EF 20-25%, diff HK. Gr3 DD.   LV (left ventricular) mural thrombus    a. 12/2012 Echo: EF 20% with mural thrombus No evidence of thrombus on 08/2017 echo.   Macular degeneration    Myocardial infarct ([place])    2003   PAF (paroxysmal atrial fibrillation) (H[facility])    a. CHA2DS2VASc = 5-->Xarelto/Tikosyn.   Ventricular tachycardia 11/05/2020   Past Surgical History:  Procedure Laterality Date   CARDIAC CATHETERIZATION N/A 09/20/2014   Procedure: Left Heart Cath and Coronary Angiography;  Surgeon: Jettie Booze, MD; LAD 95%, D1 100%, CFX liner percent, OM 200%, OM 390%, RCA 90%, LIMA-LAD okay, SVG-OM 2-OM 3 minimal disease, SVG-RPDA-RPLB 100% between the RPDA and RPL      CARDIAC CATHETERIZATION N/A 09/20/2014   Procedure: Coronary Stent Intervention;  Surgeon: Jettie Booze, MD; Synergy DES 4 x 24 mm reducing the stenosis to 5%    CARDIAC DEFIBRILLATOR PLACEMENT  01/17/03   6949 lead. Medtronic. remote-no; with later revision   CARDIOVERSION N/A 05/22/2016   Procedure: CARDIOVERSION;  Surgeon: Lelon Perla, MD;  Location: [facility];  Service: Cardiovascular;  Laterality: N/A;   CARDIOVERSION N/A 08/10/2017   Procedure: CARDIOVERSION;  Surgeon: Pixie Casino, MD;  Location: [facility] ENDOSCOPY;  Service: Cardiovascular;  Laterality: N/A;   CATARACT EXTRACTION W/ INTRAOCULAR LENS  IMPLANT, BILATERAL Bilateral June/-July 2009   Dr.  Groat   CATARACT EXTRACTION W/PHACO Bilateral 2010   Dr. Katy Fitch   COLON RESECTION  02/09/2011   Procedure: LAPAROSCOPIC SIGMOID COLON RESECTION;  Surgeon: Pedro Earls, MD;  Location: [facility];  Service: General;  Laterality: N/A;  Laparoscopic Assisted Sigmoid Colectomy   COLON SURGERY     COLONOSCOPY  08/31/2011   Procedure: COLONOSCOPY;  Surgeon: Jerene Bears, MD;  Location: [facility];  Service: Gastroenterology;  Laterality: N/A;   COLONOSCOPY N/A 09/05/2012   Procedure: COLONOSCOPY;  Surgeon: Jerene Bears, MD;  Location: [facility];  Service: Gastroenterology;  Laterality: N/A;   COLONOSCOPY N/A 04/18/2013   Procedure: COLONOSCOPY;  Surgeon: Jerene Bears, MD;  Location: [facility];  Service: Gastroenterology;  Laterality: N/A;   COLONOSCOPY N/A 04/09/2014   Procedure: COLONOSCOPY;  Surgeon: Jerene Bears, MD;  Location: [facility];  Service: Gastroenterology;  Laterality: N/A;   COLONOSCOPY WITH PROPOFOL N/A 05/03/2017   Procedure: COLONOSCOPY WITH PROPOFOL;  Surgeon: Jerene Bears, MD;  Location: [facility];  Service: Gastroenterology;  Laterality: N/A;   CORONARY ANGIOPLASTY     CORONARY ARTERY BYPASS GRAFT  01/2002   LIMA-LAD, SVG-OM 2-OM 3, SVG-RPDA-RPLB   CORONARY STENT INTERVENTION N/A 11/22/2018   Procedure: CORONARY STENT INTERVENTION;  Surgeon: Nelva Bush, MD;  Location: [place] [facility];  Service: Cardiovascular;  Laterality: N/A;  SVG - RCA   ICD GENERATOR CHANGE  2010   Medtronic Virtuoso II VR ICD   ICD GENERATOR CHANGEOUT N/A 12/07/2018   Procedure: ICD GENERATOR CHANGEOUT;  Surgeon: Deboraha Sprang, MD;  Location: [place] [facility];  Service: Cardiovascular;  Laterality: N/A;   INGUINAL HERNIA REPAIR Right 2000's X 2   LAPAROSCOPIC RIGHT HEMI COLECTOMY N/A 11/04/2012   Procedure: LAPAROSCOPIC RIGHT HEMI COLECTOMY;  Surgeon: Pedro Earls, MD;  Location: [facility];  Service: General;  Laterality: N/A;   LEFT HEART CATH AND CORS/GRAFTS ANGIOGRAPHY Left 11/22/2018   Procedure: LEFT HEART CATH AND CORS/GRAFTS ANGIOGRAPHY;  Surgeon: Nelva Bush, MD;  Location: [facility];  Service: Cardiovascular;  Laterality: Left;   TONSILLECTOMY  ~ 1950    Current Outpatient Medications  Medication Sig Dispense Refill   torsemide (DEMADEX) 20 MG tablet Take 40 mg by mouth 2 (two) times daily.     acetaminophen (TYLENOL) 500 MG tablet Take 500 mg by mouth every 6 (six) hours as needed for moderate pain or headache.     atorvastatin (LIPITOR) 80 MG tablet TAKE 1  TABLET BY MOUTH  DAILY 90 tablet 2   bisacodyl (DULCOLAX) 10 MG suppository Place 10 mg rectally See admin instructions. Qd on Monday and Thursday as needed for constipation     carvedilol (COREG) 3.125 MG tablet Take 1 tablet (3.125 mg total) by mouth 2 (two) times daily with a meal. Hold for sbp<100 or hr<60 30 tablet 2   divalproex (DEPAKOTE ER) 250 MG 24 hr tablet Take 2 tablets every night 60 tablet 11   ferrous sulfate 325 (65 FE) MG tablet Take 1 tablet (325 mg total) by mouth 2 (two) times daily with a meal. (Patient taking differently: Take 325 mg by mouth daily with breakfast.)  3   meclizine (ANTIVERT) 25 MG tablet Take 12.5 mg by mouth daily as needed for dizziness.     nitroGLYCERIN (NITROSTAT) 0.4 MG SL tablet DISSOLVE 1 TABLET UNDER THE TONGUE EVERY 5 MINUTES AS  NEEDED FOR CHEST PAIN. MAX  OF 3 TABLETS IN 15 MINUTES. CALL 911 IF PAIN  PERSISTS. 100 tablet 3   pantoprazole (PROTONIX) 40 MG tablet TAKE 1 TABLET BY MOUTH  DAILY 90 tablet 3   polyethylene glycol (MIRALAX / GLYCOLAX) 17 g packet Take 17 g by mouth daily as needed for mild constipation.     Potassium Chloride ER 20 MEQ TBCR Take 1 tablet by mouth every other day.     Rivaroxaban (XARELTO) 15 MG TABS tablet Take 1 tablet (15 mg total) by mouth daily with supper. 90 tablet 1   sennosides-docusate sodium (SENOKOT-S) 8.6-50 MG tablet Take 2 tablets by mouth at bedtime.     traZODone (DESYREL) 50 MG tablet Take 2 tablet every night 60 tablet 5   vitamin B-12 (CYANOCOBALAMIN) 1000 MCG tablet Take 1,000 mcg by mouth daily.     No current facility-administered medications for this visit.    Allergies:   Buspar [buspirone], Latex, and Tape   Social History: Social History   Socioeconomic History   Marital status: Widowed    Spouse name: Not on file   Number of children: 1   Years of education: Not on file   Highest education level: Not on file  Occupational History   Occupation: retired  Tobacco Use   Smoking status:  Never   Smokeless tobacco: Never  Vaping Use   Vaping Use: Never used  Substance and Sexual Activity   Alcohol use: Not Currently    Alcohol/week: 2.0 standard drinks    Types: 2 Cans of beer per week   Drug use: No   Sexual activity: Not Currently  Other Topics Concern   Not on file  Social History Narrative   Full time. Married.    Right handed   Drinks caffeine   [facility]   Social Determinants of Health   Financial Resource Strain: High Risk   Difficulty of Paying Living Expenses: Hard  Food Insecurity: No Food Insecurity   Worried About [name] fundraiser in the Last Year: Never true   Ran Out of Food in the Last Year: Never true  Transportation Needs: No Transportation Needs   Lack of Transportation (Medical): No   Lack of Transportation (Non-Medical): No  Physical Activity: Insufficiently Active   Days of Exercise per Week: 2 days   Minutes of Exercise per Session: 60 min  Stress: No Stress Concern Present   Feeling of Stress : Not at all  Social Connections: Not on file  Intimate Partner Violence: Not on file    Family History: Family History  Problem Relation Age of Onset   Hypertension Father    Hyperlipidemia Father    Heart disease Father    Prostate cancer Father    Alzheimer's disease Mother    Hypertension Sister    Hyperlipidemia Sister    Colon cancer Neg Hx    Esophageal cancer Neg Hx    Rectal cancer Neg Hx    Stomach cancer Neg Hx     Review of Systems: All other systems reviewed and are otherwise negative except as noted above.   Physical Exam: Vitals:   04/30/21 1213  BP: 100/62  Pulse: 69  SpO2: (!) 89%  Weight: 137 lb (62.1 kg)  Height: 6' (1.829 m)     GEN- The patient is well appearing, alert and oriented x 3 today.   HEENT: normocephalic, atraumatic; sclera clear, conjunctiva pink; hearing intact; oropharynx clear; neck supple, no JVP Lymph- no cervical lymphadenopathy Lungs- Clear to ausculation  bilaterally, normal work of breathing.  No wheezes,  rales, rhonchi Heart- Regular rate and rhythm, no murmurs, rubs or gallops, PMI not laterally displaced GI- soft, non-tender, non-distended, bowel sounds present, no hepatosplenomegaly Extremities- no clubbing or cyanosis. No edema; DP/PT/radial pulses 2+ bilaterally MS- no significant deformity or atrophy Skin- warm and dry, no rash or lesion; ICD pocket well healed Psych- euthymic mood, full affect Neuro- strength and sensation are intact  ICD interrogation- reviewed in detail today,  See PACEART report  EKG:  EKG is not ordered today.  Recent Labs: 11/14/2020: TSH 10.380 01/27/2021: ALT 12; Pro B Natriuretic peptide (BNP) 1,995.0 02/15/2021: Magnesium 2.1 02/17/2021: B Natriuretic Peptide 1,487.5; Hemoglobin 13.1; Platelets 188 03/19/2021: BUN 20; Creatinine, Ser 1.18; Potassium 4.9; Sodium 141   Wt Readings from Last 3 Encounters:  04/30/21 137 lb (62.1 kg)  03/13/21 131 lb 9.6 oz (59.7 kg)  02/27/21 124 lb (56.2 kg)     Other studies Reviewed: Additional studies/ records that were reviewed today include: Previous EP office notes.   Assessment and Plan:  1.  Chronic systolic dysfunction s/p Medtronic single chamber ICD  Volume status somewhat elevated but near baseline per patient.  Normal ICD function See [facility] report Echo EF < 20% over the last few years. RV normal on recent echo  NYHA III chronically. GDMT limited by end stage HF.  We have continued to recommend palliative care/hospice services but he is not interested. He is DNR [place] Rm form signed). Desires to avoid further hospitalizations Continue torsemide 40 mg BID (Which is what he is taking per daughter. Labs today.  2. Permanent A fib  Overall rate controlled  Continue xarelto 15 mg daily. Denies bleeding   3. CKD Stage II -Creatinine baseline 1.2-1.3  4. H/O CVA  5. Macular Degenerative Disease 6. Memory Impairment 7. [place] He has completed GOLD DNR  form and has that at home.  We have continued to discuss hospice/palliative care. They wish to continue to leave ICD therapies on and are not interested Hospice.   Current medicines are reviewed at length with the patient today.    Labs/ tests ordered today include:  Orders Placed This Encounter  Procedures   Basic metabolic panel   CBC    Disposition:   Follow up with Dr. Caryl Comes in 6 months. Sooner with acute issues.   Jacalyn Lefevre, PA-C  04/30/2021 1:08 PM  [address] [place] [address] [phone] (office) [phone] (fax)

## 2021-05-02 ENCOUNTER — Other Ambulatory Visit: Payer: Self-pay

## 2021-05-02 ENCOUNTER — Encounter: Payer: Self-pay | Admitting: Neurology

## 2021-05-02 ENCOUNTER — Ambulatory Visit: Payer: Medicare Other | Admitting: Neurology

## 2021-05-02 VITALS — BP 112/54 | HR 68 | Resp 22 | Ht 71.0 in | Wt 137.0 lb

## 2021-05-02 DIAGNOSIS — F01518 Vascular dementia, unspecified severity, with other behavioral disturbance: Secondary | ICD-10-CM

## 2021-05-02 DIAGNOSIS — G4700 Insomnia, unspecified: Secondary | ICD-10-CM

## 2021-05-02 DIAGNOSIS — G40209 Localization-related (focal) (partial) symptomatic epilepsy and epileptic syndromes with complex partial seizures, not intractable, without status epilepticus: Secondary | ICD-10-CM

## 2021-05-02 MED ORDER — TRAZODONE HCL 50 MG PO TABS
ORAL_TABLET | ORAL | 3 refills | Status: DC
Start: 2021-05-02 — End: 2021-05-12

## 2021-05-02 MED ORDER — DIVALPROEX SODIUM ER 250 MG PO TB24: ORAL_TABLET | ORAL | 3 refills | Status: AC

## 2021-05-02 NOTE — Patient Instructions (Signed)
Good to see you.  Continue Depakote 250mg : Take 2 tablets every night  2. Continue Trazodone 50mg :Take 2 tablets every night. Continue working on keeping active during the day. If no improvement in sleep despite staying awake during the day, we can increase Trazodone  3. Continue 24/7 care  4. Follow-up in 6 months, call for any changes   Seizure Precautions: 1. If medication has been prescribed for you to prevent seizures, take it exactly as directed.  Do not stop taking the medicine without talking to your doctor first, even if you have not had a seizure in a long time.   2. Avoid activities in which a seizure would cause danger to yourself or to others.  Don't operate dangerous machinery, swim alone, or climb in high or dangerous places, such as on ladders, roofs, or girders.  Do not drive unless your doctor says you may.  3. If you have any warning that you may have a seizure, lay down in a safe place where you can't hurt yourself.    4.  No driving for 6 months from last seizure, as per [facility].   Please refer to the following link on the [place] website for more information: [email or web address]/[email or web address]   5.  Maintain good sleep hygiene.  6.  Contact your doctor if you have any problems that may be related to the medicine you are taking.  7.  Call 911 and bring the patient back to the ED if:        A.  The seizure lasts longer than 5 minutes.       B.  The patient doesn't awaken shortly after the seizure  C.  The patient has new problems such as difficulty seeing, speaking or moving  D.  The patient was injured during the seizure  E.  The patient has a temperature over 102 F (39C)  F.  The patient vomited and now is having trouble breathing

## 2021-05-02 NOTE — Progress Notes (Signed)
NEUROLOGY FOLLOW UP OFFICE NOTE  Mark Clayton [id number] 01-19-1941  HISTORY OF PRESENT ILLNESS: I had the pleasure of seeing Mark Clayton in follow-up in the neurology clinic on 05/02/2021.  The patient was last seen 4 months ago for seizures secondary to stroke, as well as vascular dementia. He is again accompanied by his daughter Mark Clayton who helps supplement the history today. Since his last visit, they deny any seizures since starting Depakote ER 500mg  qhs in July 2022. Main concern on last visit was anxiety, irritability, visual hallucinations, and poor sleep. He was started on Trazodone, currently on 100mg  qhs. This has helped some with the anxiety/irritability, he is in good spirits today. He has not complained much about the hallucinations, however today reports that he still sees flowers on the floor daily. Hallucinations are non-threatening. He is still having sleep difficulties, some days are really good, other times he is up and down every couple of hours and gets out of the bed to his recliner. He needs assistance with transfers. No headaches, dizziness, no falls. He has vision loss.   EKG in 10/2020 showed prolonged QTc of 531.    History on Initial Assessment 10/11/2020: This is a 81 year old right-handed man with a history of atrial fibrillation on Xarelto, s/p ICD placement, hypertension, colon cancer, left MCA stroke with traumatic left vertex SDH in March 2021 with residual right hemiparesis presenting for evaluation of possible seizures. His granddaughter Mark Clayton lives with him. After the stroke in 05/2019, [facility] recalls that he had episodes of zoning out/unresponsive for 30 seconds to a couple of minutes that stopped for a while, then recurred, last episode was 3-4 weeks ago. There is no prior warning, he is amnestic of events. There would be a quick jerk after the staring. Lately his right arm feels like there is electricity. His right leg is numb, sometimes it hurts/aches. He has a  walker at home. He had a fall a few months ago and since then started having visual hallucinations that are non-threatening to him. He says "I see things," he sees people, animals, figures, things on the floor. No auditory component. He says he sees them all the time, people dancing in the mirrors, animals playing outside. He blinks and they would be gone. One time he wanted to talk to a cop next to [place]. He was started on Remeron in May 2022. He gets disoriented mostly at night, but this occurs in the daytime as well. He says the pictures on the walls change, he points to a painting of boats in the office, he says it changes into a house when he closes his eyes. He wakes up and there is a TV show going on, but he does not have a TV in his room. [name] notes that anxiety also started at that time. He gets panic attacks and sometimes may have the zoning out, occurring every couple of weeks. He may grip his knees but no clear dystonia or automatisms. It takes him about 1.5 minutes to recognize her. No associated tongue bite or incontinence. A few nights ago he woke up and did not remember where he was or [name]'s name. Mark Clayton has lived with him since she was born, he raised her. She started helping with medications a year ago. She states it was not due to memory problems but due to needing to get things together physically. He has started having problems remembering names. She took over finances a year ago. He does not drive.  He gets agitated a lot because he used to be very independent and gets angry about it. He denies any headaches, dysarthria/dysphagia, focal numbness/tingling/weakness, myoclonic jerks. He get dizzy and takes meclizine for vertigo, but also has periods where he feels lightheaded/faint. This has gotten worse recently as well. No loss of consciousness. With macular degeneration, he can count fingers. He has neck pain and some constipation. He had a fall when he stood up and his foot caught on  the wheelchair, bumping his head a little.   I personally reviewed imaging from 05/2019, last head CT showed resolution of ICH including SAH and SDH. Head CT showed left parietal infarct.    Lab Results  Component Value Date   TSH 10.380 (H) 11/14/2020   Lab Results  Component Value Date   VITAMINB12 1,455 (H) 11/14/2020    PAST MEDICAL HISTORY: Past Medical History:  Diagnosis Date   AICD (automatic cardioverter/defibrillator) present 01/17/2003   Medtronic Maximo 7232CX ICD, serial #[id number]   Anemia 02/06/2011   takes oral iron   Arthritis    hands, knees   CAD (coronary artery disease) 2003   h/o MI and CABG in 2003. // s/p DES to [place] in 08/2014 // s/p DES to S-RPDA in 10/2018 (Cath w patent RCA stent, L-LAD and S-RI/OM2/OM3; jump graft from RPDA to RPL w CTO)   Cancer of sigmoid colon ([place]) 2012   a. s/p colon surgery.   Carotid bruit    Cough    Exudative age-related macular degeneration of left eye with active choroidal neovascularization ([place]) 07/12/2019   Exudative age-related macular degeneration, right eye, with inactive choroidal neovascularization ([place]) 07/12/2019   GERD (gastroesophageal reflux disease) 02/06/2011   HFrEF (heart failure with reduced ejection fraction) ([place])    a. EF 20% in 2014; b. 08/2017 Echo: EF 20-25%, diff HK, Gr3 DD. Triv AI. Mod MR. Sev dil LA. Mildly dil RV w/ mildly reduced RV fxn. Mildly dil RA. Mod TR. PASP 6mHg. // Echo 3/21: EF < 20, mild MR, mild AI, mod LAE, mod reduced RVSF   HTN (hypertension)    Hyperlipidemia    Ischemic cardiomyopathy    a. EF 20% in 2014. (Master study EF >20%); b. 08/2017 Echo: EF 20-25%, diff HK. Gr3 DD.   LV (left ventricular) mural thrombus    a. 12/2012 Echo: EF 20% with mural thrombus No evidence of thrombus on 08/2017 echo.   Macular degeneration    Myocardial infarct ([place])    2003   PAF (paroxysmal atrial fibrillation) ([facility])    a. CHA2DS2VASc = 5-->Xarelto/Tikosyn.   Ventricular tachycardia  11/05/2020    MEDICATIONS: Current Outpatient Medications on File Prior to Visit  Medication Sig Dispense Refill   acetaminophen (TYLENOL) 500 MG tablet Take 500 mg by mouth every 6 (six) hours as needed for moderate pain or headache.     atorvastatin (LIPITOR) 80 MG tablet TAKE 1 TABLET BY MOUTH  DAILY 90 tablet 2   bisacodyl (DULCOLAX) 10 MG suppository Place 10 mg rectally See admin instructions. Qd on Monday and Thursday as needed for constipation     carvedilol (COREG) 3.125 MG tablet Take 1 tablet (3.125 mg total) by mouth 2 (two) times daily with a meal. Hold for sbp<100 or hr<60 30 tablet 2   divalproex (DEPAKOTE ER) 250 MG 24 hr tablet Take 2 tablets every night 60 tablet 11   ferrous sulfate 325 (65 FE) MG tablet Take 1 tablet (325 mg total) by mouth 2 (two) times daily  with a meal. (Patient taking differently: Take 325 mg by mouth daily with breakfast.)  3   meclizine (ANTIVERT) 25 MG tablet Take 12.5 mg by mouth daily as needed for dizziness.     nitroGLYCERIN (NITROSTAT) 0.4 MG SL tablet DISSOLVE 1 TABLET UNDER THE TONGUE EVERY 5 MINUTES AS  NEEDED FOR CHEST PAIN. MAX  OF 3 TABLETS IN 15 MINUTES. CALL 911 IF PAIN PERSISTS. 100 tablet 3   pantoprazole (PROTONIX) 40 MG tablet TAKE 1 TABLET BY MOUTH  DAILY 90 tablet 3   polyethylene glycol (MIRALAX / GLYCOLAX) 17 g packet Take 17 g by mouth daily as needed for mild constipation.     Potassium Chloride ER 20 MEQ TBCR Take 1 tablet by mouth every other day.     Rivaroxaban (XARELTO) 15 MG TABS tablet Take 1 tablet (15 mg total) by mouth daily with supper. 90 tablet 1   sennosides-docusate sodium (SENOKOT-S) 8.6-50 MG tablet Take 2 tablets by mouth at bedtime.     torsemide (DEMADEX) 20 MG tablet Take 40 mg by mouth 2 (two) times daily.     traZODone (DESYREL) 50 MG tablet Take 2 tablet every night 60 tablet 5   vitamin B-12 (CYANOCOBALAMIN) 1000 MCG tablet Take 1,000 mcg by mouth daily.     No current facility-administered medications  on file prior to visit.    ALLERGIES: Allergies  Allergen Reactions   Buspar [Buspirone] Other (See Comments)    Fatigue, body aches and shortness of breath    Latex Rash   Tape Rash and Other (See Comments)    USE PAPER    FAMILY HISTORY: Family History  Problem Relation Age of Onset   Hypertension Father    Hyperlipidemia Father    Heart disease Father    Prostate cancer Father    Alzheimer's disease Mother    Hypertension Sister    Hyperlipidemia Sister    Colon cancer Neg Hx    Esophageal cancer Neg Hx    Rectal cancer Neg Hx    Stomach cancer Neg Hx     SOCIAL HISTORY: Social History   Socioeconomic History   Marital status: Widowed    Spouse name: Not on file   Number of children: 1   Years of education: Not on file   Highest education level: Not on file  Occupational History   Occupation: retired  Tobacco Use   Smoking status: Never   Smokeless tobacco: Never  Vaping Use   Vaping Use: Never used  Substance and Sexual Activity   Alcohol use: Not Currently    Alcohol/week: 2.0 standard drinks    Types: 2 Cans of beer per week   Drug use: No   Sexual activity: Not Currently  Other Topics Concern   Not on file  Social History Narrative   Full time. Married.    Right handed   Drinks caffeine   [facility]   Social Determinants of Health   Financial Resource Strain: High Risk   Difficulty of Paying Living Expenses: Hard  Food Insecurity: No Food Insecurity   Worried About [name] fundraiser in the Last Year: Never true   Ran Out of Food in the Last Year: Never true  Transportation Needs: No Transportation Needs   Lack of Transportation (Medical): No   Lack of Transportation (Non-Medical): No  Physical Activity: Insufficiently Active   Days of Exercise per Week: 2 days   Minutes of Exercise per Session: 60 min  Stress: No  Stress Concern Present   Feeling of Stress : Not at all  Social Connections: Not on file  Intimate Partner  Violence: Not on file     PHYSICAL EXAM: Vitals:   05/02/21 1308  Pulse: 68  Resp: (!) 22  SpO2: 97%   General: No acute distress Head:  Normocephalic/atraumatic Skin/Extremities: No rash, no edema Neurological Exam: alert and awake. No aphasia or dysarthria. Fund of knowledge is appropriate.  Attention and concentration are normal.   Cranial nerves: Pupils equal, round. Extraocular movements intact with no nystagmus. Visual fields full.  No facial asymmetry.  Motor: increased tone on right. muscle strength 5/5 on left UE and LE, 4/5 on right UE, 2-3/5 right LE. Finger to nose testing intact on left, difficulty on right due to weakness. Gait not tested.   IMPRESSION: This is an 81 yo RH man with a history of atrial fibrillation on Xarelto, s/p ICD placement, hypertension, colon cancer, left MCA stroke with traumatic left vertex SDH in March 2021 with residual right hemiparesis with recurrent episodes of unresponsiveness suggestive of focal seizures with impaired awareness. Repeat head CTs have shown chronic left frontal parietal encephalomalacia. EEG showed occasional left hemisphere slowing. No further staring spells since starting Depakote ER 500mg  qhs. There has been some improvement in anxiety/hallucinations with Trazodone, however sleep remains an issue. Advised to continue working on sleep hygiene, keeping him active during the day. If no improvement despite staying up in daytime, we can increase Trazodone to 150mg  qhs. Continue 24/7 care. Follow-up in 6 months, call for any changes.     Thank you for allowing me to participate in his care.  Please do not hesitate to call for any questions or concerns.    Ellouise Newer, M.D.   CC: Dr. Sharlet Salina

## 2021-05-02 NOTE — Progress Notes (Signed)
Remote ICD transmission.   

## 2021-05-03 DIAGNOSIS — Z951 Presence of aortocoronary bypass graft: Secondary | ICD-10-CM | POA: Diagnosis not present

## 2021-05-03 DIAGNOSIS — I130 Hypertensive heart and chronic kidney disease with heart failure and stage 1 through stage 4 chronic kidney disease, or unspecified chronic kidney disease: Secondary | ICD-10-CM | POA: Diagnosis not present

## 2021-05-03 DIAGNOSIS — I255 Ischemic cardiomyopathy: Secondary | ICD-10-CM | POA: Diagnosis not present

## 2021-05-03 DIAGNOSIS — R410 Disorientation, unspecified: Secondary | ICD-10-CM | POA: Diagnosis not present

## 2021-05-03 DIAGNOSIS — I951 Orthostatic hypotension: Secondary | ICD-10-CM | POA: Diagnosis not present

## 2021-05-03 DIAGNOSIS — I69351 Hemiplegia and hemiparesis following cerebral infarction affecting right dominant side: Secondary | ICD-10-CM | POA: Diagnosis not present

## 2021-05-03 DIAGNOSIS — E785 Hyperlipidemia, unspecified: Secondary | ICD-10-CM | POA: Diagnosis not present

## 2021-05-03 DIAGNOSIS — Z7901 Long term (current) use of anticoagulants: Secondary | ICD-10-CM | POA: Diagnosis not present

## 2021-05-03 DIAGNOSIS — I472 Ventricular tachycardia, unspecified: Secondary | ICD-10-CM | POA: Diagnosis not present

## 2021-05-03 DIAGNOSIS — C189 Malignant neoplasm of colon, unspecified: Secondary | ICD-10-CM | POA: Diagnosis not present

## 2021-05-03 DIAGNOSIS — I4819 Other persistent atrial fibrillation: Secondary | ICD-10-CM | POA: Diagnosis not present

## 2021-05-03 DIAGNOSIS — H3530 Unspecified macular degeneration: Secondary | ICD-10-CM | POA: Diagnosis not present

## 2021-05-03 DIAGNOSIS — Z955 Presence of coronary angioplasty implant and graft: Secondary | ICD-10-CM | POA: Diagnosis not present

## 2021-05-03 DIAGNOSIS — K219 Gastro-esophageal reflux disease without esophagitis: Secondary | ICD-10-CM | POA: Diagnosis not present

## 2021-05-03 DIAGNOSIS — I5043 Acute on chronic combined systolic (congestive) and diastolic (congestive) heart failure: Secondary | ICD-10-CM | POA: Diagnosis not present

## 2021-05-03 DIAGNOSIS — N1831 Chronic kidney disease, stage 3a: Secondary | ICD-10-CM | POA: Diagnosis not present

## 2021-05-03 DIAGNOSIS — Z95810 Presence of automatic (implantable) cardiac defibrillator: Secondary | ICD-10-CM | POA: Diagnosis not present

## 2021-05-03 DIAGNOSIS — D631 Anemia in chronic kidney disease: Secondary | ICD-10-CM | POA: Diagnosis not present

## 2021-05-05 ENCOUNTER — Ambulatory Visit (INDEPENDENT_AMBULATORY_CARE_PROVIDER_SITE_OTHER): Payer: Medicare Other

## 2021-05-05 DIAGNOSIS — Z95810 Presence of automatic (implantable) cardiac defibrillator: Secondary | ICD-10-CM

## 2021-05-05 DIAGNOSIS — I5042 Chronic combined systolic (congestive) and diastolic (congestive) heart failure: Secondary | ICD-10-CM

## 2021-05-07 ENCOUNTER — Telehealth: Payer: Self-pay

## 2021-05-07 NOTE — Telephone Encounter (Signed)
Remote ICM transmission received.  Attempted call to daughter Emanuelle Bastos per [facility] regarding ICM remote transmission and left detailed message per DPR to return call.

## 2021-05-07 NOTE — Progress Notes (Signed)
EPIC Encounter for ICM Monitoring  Patient Name: Mark Clayton is a 81 y.o. male Date: 05/07/2021 Primary Care Physican: [name], MD Cardiologist: Marlou Porch Electrophysiologist: Caryl Comes 04/30/2021 Office Weight: 137 lbs   Time in AF:  24.0 hr/day (99.9%) (taking Xarelto)          Attempted call to daughter Ladarrious Kirksey per DPR and unable to reach.  Left detailed message per DPR regarding transmission. Transmission reviewed.          Optivol Thoracic impedance suggesting fluid levels trending close to baseline normal.   Prescribed:  Torsemide 20 mg 2 tablets (40 mg total) by mouth twice a day.  Potassium 20 mEq take 1 tablet (20 mEq total) every other day   Labs: 03/19/2021 Creatinine 1.18, BUN 20, Potassium 4.9, Sodium 141  03/13/2021 Creatinine 1.15, BUN 25, Potassium 5.1, Sodium 138, GFR >60  02/27/2021 Creatinine 0.99, BUN 25, Potassium 5.0, Sodium 138, GFR >60 02/19/2021 Creatinine 1.22, BUN 30, Potassium 4.4, Sodium 135, GFR 60  A complete set of results can be found in Results Review.   Recommendations:  Left voice mail with ICM number and encouraged to call if experiencing any fluid symptoms.   Follow-up plan: ICM clinic phone appointment on 05/13/2021 to recheck fluid levels.  91 day device clinic remote transmission 07/23/2021.     EP/Cardiology Office Visits:  07/03/2021 with Dr Marlou Porch.  Recall 10/27/2021 with Dr Caryl Comes.     Copy of ICM check sent to Dr. Caryl Comes.  Will send to Dr Marlou Porch for review if daughter is reached.  3 month ICM trend: 05/05/2021.    12-14 Month ICM trend:     Rosalene Billings, RN 05/07/2021 12:39 PM

## 2021-05-11 ENCOUNTER — Encounter: Payer: Self-pay | Admitting: Neurology

## 2021-05-12 ENCOUNTER — Encounter: Payer: Self-pay | Admitting: Internal Medicine

## 2021-05-12 ENCOUNTER — Other Ambulatory Visit: Payer: Self-pay | Admitting: Neurology

## 2021-05-12 ENCOUNTER — Telehealth (INDEPENDENT_AMBULATORY_CARE_PROVIDER_SITE_OTHER): Payer: Medicare Other | Admitting: Internal Medicine

## 2021-05-12 ENCOUNTER — Other Ambulatory Visit: Payer: Self-pay

## 2021-05-12 DIAGNOSIS — U071 COVID-19: Secondary | ICD-10-CM | POA: Diagnosis not present

## 2021-05-12 MED ORDER — TRAZODONE HCL 50 MG PO TABS: ORAL_TABLET | ORAL | 3 refills | Status: AC

## 2021-05-12 MED ORDER — MOLNUPIRAVIR EUA 200MG CAPSULE: 4.0000 | ORAL_CAPSULE | Freq: Two times a day (BID) | ORAL | 0 refills | Status: AC

## 2021-05-12 NOTE — Assessment & Plan Note (Signed)
Rx molnupiravir, can use imodium for diarrhea.

## 2021-05-12 NOTE — Progress Notes (Signed)
Virtual Visit via Audio Note  I connected with Mark Clayton on 05/12/21 at  8:00 AM EST by a video enabled telemedicine application and verified that I am speaking with the correct person using two identifiers.  The patient and the provider were at separate locations throughout the entire encounter. Patient location: home, Provider location: work   I discussed the limitations of evaluation and management by telemedicine and the availability of in person appointments. The patient expressed understanding and agreed to proceed. The patient and the provider were the only parties present for the visit unless noted in HPI below.  History of Present Illness: The patient is a 81 y.o. man with visit for covid-19 positive. Sister had this last week. Two diarrhea today and positive covid-19  Observations/Objective: A and O times 3  Assessment and Plan: See problem oriented charting  Follow Up Instructions: rx molnupiravir  Visit time 7 minutes in non-face to face communication with patient and coordination of care.  I discussed the assessment and treatment plan with the patient. The patient was provided an opportunity to ask questions and all were answered. The patient agreed with the plan and demonstrated an understanding of the instructions.   The patient was advised to call back or seek an in-person evaluation if the symptoms worsen or if the condition fails to improve as anticipated.  [name], MD

## 2021-05-13 ENCOUNTER — Ambulatory Visit (INDEPENDENT_AMBULATORY_CARE_PROVIDER_SITE_OTHER): Payer: Medicare Other

## 2021-05-13 DIAGNOSIS — I5042 Chronic combined systolic (congestive) and diastolic (congestive) heart failure: Secondary | ICD-10-CM

## 2021-05-13 DIAGNOSIS — Z95810 Presence of automatic (implantable) cardiac defibrillator: Secondary | ICD-10-CM

## 2021-05-14 ENCOUNTER — Telehealth: Payer: Self-pay

## 2021-05-14 NOTE — Telephone Encounter (Signed)
LMOVM for patient to send missed ICM Transmission. 

## 2021-05-15 NOTE — Progress Notes (Signed)
EPIC Encounter for ICM Monitoring  Patient Name: Mark Clayton is a 81 y.o. male Date: 05/15/2021 Primary Care Physican: [name], MD Cardiologist: Marlou Porch Electrophysiologist: Caryl Comes 04/30/2021 Office Weight: 137 lbs   Time in AF:(100.0%) (taking Xarelto)          Attempted call to daughter Eustace Hur per DPR and unable to reach.  Left detailed message per DPR regarding transmission. Transmission reviewed.          Optivol Thoracic impedance suggesting possible fluid accumulation starting 2/1.   Prescribed:  Torsemide 20 mg 2 tablets (40 mg total) by mouth twice a day.  Potassium 20 mEq take 1 tablet (20 mEq total) every other day   Labs: 03/19/2021 Creatinine 1.18, BUN 20, Potassium 4.9, Sodium 141  03/13/2021 Creatinine 1.15, BUN 25, Potassium 5.1, Sodium 138, GFR >60  02/27/2021 Creatinine 0.99, BUN 25, Potassium 5.0, Sodium 138, GFR >60 02/19/2021 Creatinine 1.22, BUN 30, Potassium 4.4, Sodium 135, GFR 60  A complete set of results can be found in Results Review.   Recommendations:  Left voice mail with ICM number and encouraged to call if experiencing any fluid symptoms.     Follow-up plan: ICM clinic phone appointment on 05/26/2021 to recheck fluid levels.  91 day device clinic remote transmission 07/23/2021.     EP/Cardiology Office Visits:  07/03/2021 with Dr Marlou Porch.  Recall 10/27/2021 with Dr Caryl Comes.     Copy of ICM check sent to Dr. Caryl Comes.  Will send to Dr Marlou Porch for review if daughter is reached.  3 month ICM trend: 05/14/2021.    12-14 Month ICM trend:     Rosalene Billings, RN 05/15/2021 2:51 PM

## 2021-05-16 DIAGNOSIS — I5023 Acute on chronic systolic (congestive) heart failure: Secondary | ICD-10-CM | POA: Diagnosis not present

## 2021-05-16 DIAGNOSIS — Z8673 Personal history of transient ischemic attack (TIA), and cerebral infarction without residual deficits: Secondary | ICD-10-CM | POA: Diagnosis not present

## 2021-05-24 ENCOUNTER — Other Ambulatory Visit: Payer: Self-pay | Admitting: Cardiology

## 2021-05-24 ENCOUNTER — Other Ambulatory Visit: Payer: Self-pay | Admitting: Internal Medicine

## 2021-05-24 DIAGNOSIS — I5042 Chronic combined systolic (congestive) and diastolic (congestive) heart failure: Secondary | ICD-10-CM

## 2021-05-26 ENCOUNTER — Ambulatory Visit (INDEPENDENT_AMBULATORY_CARE_PROVIDER_SITE_OTHER): Payer: Medicare Other

## 2021-05-26 DIAGNOSIS — I5042 Chronic combined systolic (congestive) and diastolic (congestive) heart failure: Secondary | ICD-10-CM

## 2021-05-26 DIAGNOSIS — Z95810 Presence of automatic (implantable) cardiac defibrillator: Secondary | ICD-10-CM

## 2021-05-27 ENCOUNTER — Telehealth: Payer: Medicare Other

## 2021-05-27 ENCOUNTER — Telehealth: Payer: Self-pay

## 2021-05-27 NOTE — Progress Notes (Unsigned)
Chronic Care Management Pharmacy Note  05/27/2021 Name:  [name] MRN:  [id number] DOB:  12-06-1940  Summary: ***  Recommendations/Changes made from today's visit: ***  Plan: ***  Subjective: Mark Clayton is an 81 y.o. year old male who is a primary patient of [name], MD.  The CCM team was consulted for assistance with disease management and care coordination needs.    Engaged with patient by telephone for follow up visit in response to provider referral for pharmacy case management and/or care coordination services.   Consent to Services:  The patient was given the following information about Chronic Care Management services today, agreed to services, and gave verbal consent: 1. CCM service includes personalized support from designated clinical staff supervised by the primary care provider, including individualized plan of care and coordination with other care providers 2. 24/7 contact phone numbers for assistance for urgent and routine care needs. 3. Service will only be billed when office clinical staff spend 20 minutes or more in a month to coordinate care. 4. Only one practitioner may furnish and bill the service in a calendar month. 5.The patient may stop CCM services at any time (effective at the end of the month) by phone call to the office staff. 6. The patient will be responsible for cost sharing (co-pay) of up to 20% of the service fee (after annual deductible is met). Patient agreed to services and consent obtained.  Patient Care Team: [name], MD as PCP - General (Internal Medicine) Deboraha Sprang, MD as PCP - Electrophysiology (Cardiology) Jerline Pain, MD as PCP - Cardiology (Cardiology) Zadie Rhine Clent Demark, MD as Consulting Physician (Ophthalmology) Short, Laurie Panda, RN as Registered Nurse Pyrtle, Lajuan Lines, MD as Consulting Physician (Gastroenterology) Cameron Sprang, MD as Consulting Physician (Neurology) Tomasa Blase, [facility]  (Pharmacist)  Recent office visits: 02/25/2021 - Dr. Sharlet Salina - hospital f/u for HF exacerbation and fluid build up  - xarelto dose decreased to 108m daily due to CrCl 01/27/2021 - Dr. CSharlet Salina- no changes to medications   Recent consult visits: 05/02/2021 - Dr. ADelice Lesch- Neurology - recommended sleep hygiene - to increase trazodone to 1578mhs if still having issues with sleep - f/u in 6 months  04/30/2021 - MiBarrington EllisonA-C - Cardiology - no changes to current medications - f/u in 6 months  01/20/2021 - Dr. AqDelice Lesch Neurology - start trazodone 5053mhs for sleep - to increase to 100m58m needed - f/u in 3 months   [facility]: [facility] / fluid accumulation - furosemide 60mg23mly, xarelto decreased to 15mg 14my   Objective:  Lab Results  Component Value Date   CREATININE 1.13 04/30/2021   BUN 19 04/30/2021   GFR 64.27 01/27/2021   GFRNONAA >60 03/13/2021   GFRAA 77 05/10/2020   NA 128 (L) 04/30/2021   K 4.6 04/30/2021   CALCIUM 9.2 04/30/2021   CO2 22 04/30/2021   GLUCOSE 74 04/30/2021    Lab Results  Component Value Date/Time   HGBA1C 6.7 (H) 11/14/2020 03:05 AM   HGBA1C 6.7 (H) 09/26/2020 11:37 AM   GFR 64.27 01/27/2021 11:44 AM   GFR 56.26 (L) 10/08/2020 04:06 PM    Last diabetic Eye exam:  No results found for: HMDIABEYEEXA  Last diabetic Foot exam:  No results found for: HMDIABFOOTEX   Lab Results  Component Value Date   CHOL 78 11/14/2020   HDL 31 (L)  11/14/2020   LDLCALC 36 11/14/2020   LDLDIRECT 104 (H) 03/28/2019   TRIG 57 11/14/2020   CHOLHDL 2.5 11/14/2020    Hepatic Function Latest Ref Rng & Units 01/27/2021 11/13/2020 10/31/2020  Total Protein 6.0 - 8.3 g/dL 6.1 5.1(L) 5.6(L)  Albumin 3.5 - 5.2 g/dL 3.4(L) 2.8(L) 3.3(L)  AST 0 - 37 U/L _0 ALT 0 - 53 U/L _1 Alk Phosphatase 39 - 117 U/L 141(H) 62 102  Total Bilirubin 0.2 - 1.2 mg/dL 1.1 2.1(H) 1.4(H)  Bilirubin, Direct  0.0 - 0.3 mg/dL - - -    Lab Results  Component Value Date/Time   TSH 10.380 (H) 11/14/2020 03:20 PM   TSH 12.000 (H) 10/31/2020 02:29 PM   TSH 5.80 (H) 09/26/2020 11:37 AM   FREET4 1.17 (H) 11/15/2020 09:37 AM   FREET4 1.50 10/31/2020 02:29 PM    CBC Latest Ref Rng & Units 04/30/2021 02/17/2021 02/09/2021  WBC 3.4 - 10.8 x10E3/uL 5.1 5.2 4.8  Hemoglobin 13.0 - 17.7 g/dL 12.5(L) 13.1 11.8(L)  Hematocrit 37.5 - 51.0 % 36.0(L) 41.1 37.1(L)  Platelets 150 - 450 x10E3/uL 167 188 121(L)    Lab Results  Component Value Date/Time   VD25OH 22.38 (L) 09/26/2020 11:37 AM   VD25OH 33 10/04/2019 10:36 AM    Clinical ASCVD: Yes  The ASCVD Risk score (Arnett DK, et al., 2019) failed to calculate for the following reasons:   The 2019 ASCVD risk score is only valid for ages 33 to 74   The patient has a prior MI or stroke diagnosis    Depression screen [facility] 2/9 06/20/2020 03/18/2020  Decreased Interest 0 0  Down, Depressed, Hopeless 0 0  PHQ - 2 Score 0 0  Some recent data might be hidden    Social History   Tobacco Use  Smoking Status Never  Smokeless Tobacco Never   BP Readings from Last 3 Encounters:  05/02/21 (!) 112/54  04/30/21 100/62  03/13/21 104/70   Pulse Readings from Last 3 Encounters:  05/02/21 68  04/30/21 69  03/13/21 88   Wt Readings from Last 3 Encounters:  05/02/21 137 lb (62.1 kg)  04/30/21 137 lb (62.1 kg)  03/13/21 131 lb 9.6 oz (59.7 kg)   BMI Readings from Last 3 Encounters:  05/02/21 19.11 kg/m  04/30/21 18.58 kg/m  03/13/21 18.35 kg/m    Assessment/Interventions: Review of patient past medical history, allergies, medications, health status, including review of consultants reports, laboratory and other test data, was performed as part of comprehensive evaluation and provision of chronic care management services.   SDOH:  (Social Determinants of Health) assessments and interventions performed: Yes  SDOH Screenings   Alcohol Screen: Low Risk     Last Alcohol Screening Score (AUDIT): 0  Depression (PHQ2-9): Low Risk    PHQ-2 Score: 0  Financial Resource Strain: High Risk   Difficulty of Paying Living Expenses: Hard  Food Insecurity: No Food Insecurity   Worried About [name] fundraiser in the Last Year: Never true   Ran Out of Food in the Last Year: Never true  Housing: Low Risk    Last Housing Risk Score: 0  Physical Activity: Insufficiently Active   Days of Exercise per Week: 2 days   Minutes of Exercise per Session: 60 min  Social Connections: Not on file  Stress: No Stress Concern Present   Feeling of Stress : Not at all  Tobacco Use: Low Risk    Smoking Tobacco Use:  Never   Smokeless Tobacco Use: Never   Passive Exposure: Not on file  Transportation Needs: No Transportation Needs   Lack of Transportation (Medical): No   Lack of Transportation (Non-Medical): No    CCM Care Plan  Allergies  Allergen Reactions   Buspar [Buspirone] Other (See Comments)    Fatigue, body aches and shortness of breath    Latex Rash   Tape Rash and Other (See Comments)    USE PAPER    Medications Reviewed Today     Reviewed by [name], MD (Physician) on 05/12/21 at [place] List Status: <None>   Medication Order Taking? Sig Documenting Provider Last Dose Status Informant  acetaminophen (TYLENOL) 500 MG tablet [id number] No Take 500 mg by mouth every 6 (six) hours as needed for moderate pain or headache. [provider] Taking Active Child  atorvastatin (LIPITOR) 80 MG tablet [id number]  TAKE 1 TABLET BY MOUTH  DAILY Jerline Pain, MD  Active   bisacodyl (DULCOLAX) 10 MG suppository [id number] No Place 10 mg rectally See admin instructions. Qd on Monday and Thursday as needed for constipation [provider] Taking Active Child  carvedilol (COREG) 3.125 MG tablet [id number] No Take 1 tablet (3.125 mg total) by mouth 2 (two) times daily with a meal. Hold for sbp<100 or hr<60 Ninfa Meeker, Amy D, NP Taking  Active   divalproex (DEPAKOTE ER) 250 MG 24 hr tablet [id number]  Take 2 tablets every night Cameron Sprang, MD  Active   ferrous sulfate 325 (65 FE) MG tablet [id number] No Take 1 tablet (325 mg total) by mouth 2 (two) times daily with a meal.  Patient taking differently: Take 325 mg by mouth daily with breakfast.   Shelly Coss, MD Taking Active Child  meclizine (ANTIVERT) 25 MG tablet [id number] No Take 12.5 mg by mouth daily as needed for dizziness. [provider] Taking Active Child  nitroGLYCERIN (NITROSTAT) 0.4 MG SL tablet [id number] No DISSOLVE 1 TABLET UNDER THE TONGUE EVERY 5 MINUTES AS  NEEDED FOR CHEST PAIN. MAX  OF 3 TABLETS IN 15 MINUTES. CALL 911 IF PAIN PERSISTS. Jerline Pain, MD Taking Active Child  pantoprazole (PROTONIX) 40 MG tablet [id number] No TAKE 1 TABLET BY MOUTH  DAILY [name], MD Taking Active Child  polyethylene glycol (MIRALAX / GLYCOLAX) 17 g packet [id number] No Take 17 g by mouth daily as needed for mild constipation. [provider] Taking Active Child  Potassium Chloride ER 20 MEQ TBCR [id number]  Take 1 tablet by mouth every other day. [provider]  Active   Rivaroxaban (XARELTO) 15 MG TABS tablet [id number] No Take 1 tablet (15 mg total) by mouth daily with supper. [name], MD Taking Active   sennosides-docusate sodium (SENOKOT-S) 8.6-50 MG tablet [id number] No Take 2 tablets by mouth at bedtime. [provider] Taking Active Child  torsemide (DEMADEX) 20 MG tablet [id number] No Take 40 mg by mouth 2 (two) times daily. [provider] Taking Active   traZODone (DESYREL) 50 MG tablet [id number]  Take 3 tablets every night Cameron Sprang, MD  Active   vitamin B-12 (CYANOCOBALAMIN) 1000 MCG tablet [id number] No Take 1,000 mcg by mouth daily. [provider] Taking Active Child            Patient Active Problem List   Diagnosis Date Noted   COVID-19 05/12/2021   Acute on chronic  systolic CHF (congestive heart failure) ([place]) 02/08/2021   CHF (congestive heart  failure) ([place]) 02/08/2021   Delirium 11/26/2020   Pleural effusion due to CHF (congestive heart failure) ([place]) 11/13/2020   Chronic anticoagulation 11/05/2020   Ventricular tachycardia 11/05/2020   Chronic cough 10/24/2020   Bilateral inguinal hernia 10/11/2020   Focal neurological deficit, onset greater than 24 hours 09/27/2020   Hallucination 09/27/2020   Pre-diabetes 09/27/2020   Anxiety 08/14/2020   Hemiplegia of right dominant side as late effect of cerebral infarction ([place]) 07/19/2020   Low urine output 07/05/2020   Hypotension 07/05/2020   Bradycardia 04/14/2020   Exudative age-related macular degeneration of left eye with inactive choroidal neovascularization ([place]) 02/13/2020   Exudative age-related macular degeneration of right eye with active choroidal neovascularization ([place]) 07/12/2019   Intermediate stage nonexudative age-related macular degeneration of left eye 07/12/2019   Hx of completed stroke 06/14/2019   Dysarthria    Tinnitus 08/26/2018   Essential hypertension 01/21/2018   Coronary artery disease involving native heart with angina pectoris ([place]) 10/19/2017   Persistent atrial fibrillation    Goals of care, counseling/discussion 01/10/2016   Ischemic cardiomyopathy    Personal history of colon cancer    Implantable cardioverter-defibrillator (ICD) in situ 03/09/2012   Microcytic anemia 01/12/2011   Cardiac defibrillator  MDT VVI 12/26/2010   Hyperlipidemia 38/25/0539   Chronic systolic heart failure ([place]) 11/26/2008    Immunization History  Administered Date(s) Administered   Fluad Quad(high Dose 65+) 12/14/2018, 01/24/2020, 01/27/2021   Influenza, High Dose Seasonal PF 01/10/2016, 01/17/2018   Influenza,inj,Quad PF,6+ Mos 01/04/2015   Influenza-Unspecified 12/08/2013, 12/07/2016   Moderna SARS-COV2 Booster Vaccination 11/20/2020   Moderna Sars-Covid-2 Vaccination 04/14/2019,  05/16/2019, 02/15/2020   Pneumococcal Conjugate-13 01/04/2015   Pneumococcal-Unspecified 12/28/2012   Tdap 12/29/2010   Zoster, Live 10/12/2014    Conditions to be addressed/monitored:  Hypertension, Hyperlipidemia, Atrial Fibrillation, Heart Failure, and Coronary Artery Disease  There are no care plans that you recently modified to display for this patient.     Medication Assistance: None required.  Patient affirms current coverage meets needs.  Care Gaps: Shingrix  Pneumonia Vaccine  Tdap  COVID booster   Patient's preferred pharmacy is:  [facility] [address] [place] [address] Phone: [phone] Fax: [phone]  [facility] ([place], [place] [address] [place] [address] Phone: [phone] Fax: [phone]  [place] [address] [place] [address] Phone: [phone] Fax: [phone]  [facility] ([facility] ) - [place], [place] [address] Phone: [phone] Fax: [phone]   Uses pill box? {Yes or If no, why not?:20788} Pt endorses ***% compliance  Care Plan and Follow Up Patient Decision:  {FOLLOWUP:24991}  Plan: {CM FOLLOW UP PLAN:25073}  ***  Current Barriers:  Unable to independently monitor therapeutic efficacy  Pharmacist Clinical Goal(s):  Patient will achieve adherence to monitoring guidelines and medication adherence to achieve therapeutic efficacy through collaboration with PharmD and provider.   Interventions: 1:1 collaboration with [name], MD regarding development and update of comprehensive plan of care as evidenced by provider attestation and co-signature Inter-disciplinary care team collaboration (see longitudinal plan of care) Comprehensive medication review  performed; medication list updated in electronic medical record  Heart Failure (Goal: manage symptoms and prevent exacerbations) / Hypertension (BP goal <130/80) -Not ideally controlled -Last ejection fraction: <20% (  Date: 02/08/2021) -HF type: Systolic -NYHA Class: III (marked limitation of activity) -AHA HF Stage: B (Heart disease present - no symptoms present) -Current treatment: Carvedilol 3.141m twice daily  Torsemide 455mtwice daily  Potassium Chloride 2060mevery other day  -Medications previously tried: spironolactone, losartan, imdur, furosemide,   -Current home BP/HR readings: *** -Current dietary habits: *** -Current exercise habits: *** -Educated on {CCM HF Counseling:25125} -{CCMPHARMDINTERVENTION:25122}  Hyperlipidemia / Coronary Artery Disease/  History of Stroke: (LDL goal < 70) -Controlled Lab Results  Component Value Date   LDLCALC 36 11/14/2020  -Current treatment: Atorvastatin 46m75mily  Nitroglycerin 0.4mg 1m- use prn for chest pain -Medications previously tried: zetia, simvastatin, pravastatin, clopidogrel  -Current dietary patterns: *** -Current exercise habits: *** -Educated on {CCM HLD Counseling:25126} -{CCMPHARMDINTERVENTION:25122}  Atrial Fibrillation (Goal: prevent stroke and major bleeding) -Controlled -CHADS2VASc score: Age (2 points), Heart failure history (1 point), Hypertension history (1 point), and Stroke/TIA/Thromboembolism history (2 points) -Current treatment: Rate control: carvedilol 3.125mg 32me daily  Anticoagulation: Xarelto 15mg d73m (CrCl ~ 45.44 mL/min) -Medications previously tried: amiodarone, tikosyn -Home BP and HR readings: ***  -Counseled on {CCMAFIBCOUNSELING:25120} -{CCMPHARMDINTERVENTION:25122}  Health Maintenance -Vaccine gaps: *** -Current therapy:  *** -Educated on {ccm supplement counseling:25128} -{CCM Patient satisfied:25129} -{CCMPHARMDINTERVENTION:25122}  Patient Goals/Self-Care  Activities Patient will:  - {pharmacypatientgoals:24919}  Follow Up Plan: {CM FOLLOW UP PLAN:22EQDV:19243}

## 2021-05-27 NOTE — Telephone Encounter (Signed)
Remote ICM transmission received.  Attempted call to daughter Dain Laseter, per [facility] regarding ICM remote transmission and left detailed message per DPR to return call.

## 2021-05-27 NOTE — Progress Notes (Signed)
EPIC Encounter for ICM Monitoring  Patient Name: Mark Clayton is a 81 y.o. male Date: 05/27/2021 Primary Care Physican: [name], MD Cardiologist: Marlou Porch Electrophysiologist: Caryl Comes 04/30/2021 Office Weight: 137 lbs   Time in AF:(99.8%) (taking Xarelto)          Attempted call to daughter Mark Clayton per DPR and unable to reach.  Left detailed message per DPR regarding transmission. Transmission reviewed.          [facility] Thoracic impedance suggesting possible fluid accumulation starting 2/1.   Prescribed:  Torsemide 20 mg 2 tablets (40 mg total) by mouth twice a day.  Potassium 20 mEq take 1 tablet (20 mEq total) every other day   Labs: 03/19/2021 Creatinine 1.18, BUN 20, Potassium 4.9, Sodium 141  03/13/2021 Creatinine 1.15, BUN 25, Potassium 5.1, Sodium 138, GFR >60  02/27/2021 Creatinine 0.99, BUN 25, Potassium 5.0, Sodium 138, GFR >60 02/19/2021 Creatinine 1.22, BUN 30, Potassium 4.4, Sodium 135, GFR 60  A complete set of results can be found in Results Review.   Recommendations:  Left voice mail with ICM number and encouraged to call if experiencing any fluid symptoms.     Follow-up plan: ICM clinic phone appointment on 06/09/2021.  91 day device clinic remote transmission 07/23/2021.     EP/Cardiology Office Visits:  07/03/2021 with Dr Marlou Porch.  Recall 10/27/2021 with Dr Caryl Comes.     Copy of ICM check sent to Dr. Caryl Comes.  Will send to Dr Marlou Porch for review if daughter is reached.  3 month ICM trend: 05/26/2021.    12-14 Month ICM trend:     Rosalene Billings, RN 05/27/2021 12:41 PM

## 2021-06-09 ENCOUNTER — Telehealth: Payer: Self-pay

## 2021-06-09 ENCOUNTER — Ambulatory Visit (INDEPENDENT_AMBULATORY_CARE_PROVIDER_SITE_OTHER): Payer: Medicare Other

## 2021-06-09 DIAGNOSIS — Z95810 Presence of automatic (implantable) cardiac defibrillator: Secondary | ICD-10-CM | POA: Diagnosis not present

## 2021-06-09 DIAGNOSIS — I5042 Chronic combined systolic (congestive) and diastolic (congestive) heart failure: Secondary | ICD-10-CM

## 2021-06-09 NOTE — Progress Notes (Signed)
EPIC Encounter for ICM Monitoring ? ?Patient Name: Mark Clayton is a 81 y.o. male ?Date: 06/09/2021 ?Primary Care Physican: [name], MD ?Cardiologist: Marlou Porch ?Electrophysiologist: Caryl Comes ?04/30/2021 Office Weight: 137 lbs ? ?Since 26-May-2021 ?VT-NS (>4 beats, >200 bpm) 1  ?AF              20 ?Time in AF 23.9 hr/day (99.7%)(taking Xarelto) ?   ?      Attempted call to daughter and unable to reach. All calls go straight to voice mail. Transmission reviewed.  ?  ?      Opt[facility]ol Thoracic impedance suggesting possible fluid accumulation starting 2/1. ?  ?Prescribed:  ?Torsemide 20 mg 2 tablets (40 mg total) by mouth twice a day.  ?Potassium 20 mEq take 1 tablet (20 mEq total) every other day ?  ?Labs: ?04/30/2021 Creatinine 1.13, BUN 19, Potassium 4.6, Sodium 128, GFR 66 ?03/19/2021 Creatinine 1.18, BUN 20, Potassium 4.9, Sodium 141  ?03/13/2021 Creatinine 1.15, BUN 25, Potassium 5.1, Sodium 138, GFR >60  ?02/27/2021 Creatinine 0.99, BUN 25, Potassium 5.0, Sodium 138, GFR >60 ?02/19/2021 Creatinine 1.22, BUN 30, Potassium 4.4, Sodium 135, GFR 60  ?A complete set of results can be found in Results Review. ?  ?Recommendations:  Unable to reach daughter for ICM participation in monthly follow up.  Pt disenrolled and can be enrolled again at later date if daughter request monthly follow up.  ?  ?Follow-up plan: No further ICM clinic phone appointments due to unable to reach patient or daughter.   91 day device clinic remote transmission 07/23/2021.   ?  ?EP/Cardiology Office Visits:  07/03/2021 with Dr Marlou Porch.  Recall 10/27/2021 with Dr Caryl Comes.    ?  ?Copy of ICM check sent to Dr. Caryl Comes.   ? ?3 month ICM trend: 06/09/2021. ? ? ? ?12-14 Month ICM trend:  ? ? ? ?Rosalene Billings, RN ?06/09/2021 ?11:04 AM ? ?

## 2021-06-09 NOTE — Telephone Encounter (Signed)
Remote ICM transmission received.  Attempted call to daughter regarding ICM remote transmission and calls go straight to voice mail.  Unable to reach daughter for monthly ICM remote follow up and not actively participating in follow up.  Patient disenrolled due patient is not actively participating in [facility] clinic.  Device clinic 91 day remote monitoring will continue per protocol.   ? ?

## 2021-06-13 DIAGNOSIS — Z8673 Personal history of transient ischemic attack (TIA), and cerebral infarction without residual deficits: Secondary | ICD-10-CM | POA: Diagnosis not present

## 2021-06-13 DIAGNOSIS — I5023 Acute on chronic systolic (congestive) heart failure: Secondary | ICD-10-CM | POA: Diagnosis not present

## 2021-06-23 ENCOUNTER — Ambulatory Visit (INDEPENDENT_AMBULATORY_CARE_PROVIDER_SITE_OTHER): Payer: Medicare Other

## 2021-06-23 VITALS — Ht 71.0 in | Wt 137.0 lb

## 2021-06-23 DIAGNOSIS — Z0000 Encounter for general adult medical examination without abnormal findings: Secondary | ICD-10-CM

## 2021-06-23 NOTE — Progress Notes (Signed)
? ?Subjective:  ? Mark Clayton is a 81 y.o. male who presents for Medicare Annual/Subsequent preventive examination. ? ?I connected with [place] today by telephone and verified that I am speaking with the correct person using two identifiers. ?Location patient: home ?Location provider: work ?Persons participating in the virtual visit: patient, nurse.  ?  ?I discussed the limitations, risks, security and privacy concerns of performing an evaluation and management service by telephone and the availability of in person appointments. I also discussed with the patient that there may be a patient responsible charge related to this service. The patient expressed understanding and verbally consented to this telephonic visit.  ?  ?Interactive audio and video telecommunications were attempted between this provider and patient, however failed, due to patient having technical difficulties OR patient did not have access to video capability.  We continued and completed visit with audio only. ? ?Some vital signs may be absent or patient reported.  ? ?Time Spent with patient on telephone encounter: 30 minutes ? ? ?Review of Systems    ? ?Cardiac Risk Factors include: advanced age (>4mn, >>41women);male gender;dyslipidemia ? ?   ?Objective:  ?  ?Today's Vitals  ? 06/23/21 1353  ?Weight: 137 lb (62.1 kg)  ?Height: '5\' 11"'$  (1.803 m)  ? ?Body mass index is 19.11 kg/m?. ? ? ?  06/23/2021  ?  2:00 PM 05/02/2021  ?  1:07 PM 01/20/2021  ?  3:11 PM 01/20/2021  ?  3:07 PM 11/27/2020  ?  2:27 PM 10/11/2020  ?  1:17 PM 06/20/2020  ? 12:54 PM  ?Advanced Directives  ?Does Patient Have a Medical Advance Directive? No Yes Yes Yes Yes No Yes  ?Type of Advance Directive     Out of facility DNR (pink MOST or yellow form)  Living will;Healthcare Power of Attorney  ?Does patient want to make changes to medical advance directive?       No - Patient declined  ?Copy of [place] Chart?       No - copy requested  ? ? ?Current Medications  (verified) ?Outpatient Encounter Medications as of 06/23/2021  ?Medication Sig  ? acetaminophen (TYLENOL) 500 MG tablet Take 500 mg by mouth every 6 (six) hours as needed for moderate pain or headache.  ? atorvastatin (LIPITOR) 80 MG tablet TAKE 1 TABLET BY MOUTH  DAILY  ? bisacodyl (DULCOLAX) 10 MG suppository Place 10 mg rectally See admin instructions. Qd on Monday and Thursday as needed for constipation  ? carvedilol (COREG) 3.125 MG tablet Take 1 tablet (3.125 mg total) by mouth 2 (two) times daily with a meal. Hold for sbp<100 or hr<60  ? divalproex (DEPAKOTE ER) 250 MG 24 hr tablet Take 2 tablets every night  ? ferrous sulfate 325 (65 FE) MG tablet Take 1 tablet (325 mg total) by mouth 2 (two) times daily with a meal. (Patient taking differently: Take 325 mg by mouth daily with breakfast.)  ? meclizine (ANTIVERT) 25 MG tablet Take 12.5 mg by mouth daily as needed for dizziness.  ? nitroGLYCERIN (NITROSTAT) 0.4 MG SL tablet DISSOLVE 1 TABLET UNDER THE TONGUE EVERY 5 MINUTES AS  NEEDED FOR CHEST PAIN. MAX  OF 3 TABLETS IN 15 MINUTES. CALL 911 IF PAIN PERSISTS.  ? pantoprazole (PROTONIX) 40 MG tablet TAKE 1 TABLET BY MOUTH  DAILY  ? polyethylene glycol (MIRALAX / GLYCOLAX) 17 g packet Take 17 g by mouth daily as needed for mild constipation.  ? Potassium Chloride ER 20  MEQ TBCR Take 1 tablet by mouth every other day.  ? Rivaroxaban (XARELTO) 15 MG TABS tablet Take 1 tablet (15 mg total) by mouth daily with supper.  ? sennosides-docusate sodium (SENOKOT-S) 8.6-50 MG tablet Take 2 tablets by mouth at bedtime.  ? torsemide (DEMADEX) 20 MG tablet TAKE 2 TABLETS BY MOUTH 2 TIMES  DAILY.  ? traZODone (DESYREL) 50 MG tablet Take 3 tablets every night  ? vitamin B-12 (CYANOCOBALAMIN) 1000 MCG tablet Take 1,000 mcg by mouth daily.  ? ?No facility-administered encounter medications on file as of 06/23/2021.  ? ? ?Allergies (verified) ?Buspar [buspirone], Latex, and Tape  ? ?History: ?Past Medical History:  ?Diagnosis Date   ? AICD (automatic cardioverter/defibrillator) present 01/17/2003  ? Medtronic Maximo [id number] ICD, serial [id number]  ? Anemia 02/06/2011  ? takes oral iron  ? Arthritis   ? hands, knees  ? CAD (coronary artery disease) 2003  ? h/o MI and CABG in 2003. // s/p DES to [place] in 08/2014 // s/p DES to S-RPDA in 10/2018 (Cath w patent RCA stent, L-LAD and S-RI/OM2/OM3; jump graft from RPDA to RPL w CTO)  ? Cancer of sigmoid colon ([place]) 2012  ? a. s/p colon surgery.  ? Carotid bruit   ? Cough   ? Exudative age-related macular degeneration of left eye with active choroidal neovascularization ([place]) 07/12/2019  ? Exudative age-related macular degeneration, right eye, with inactive choroidal neovascularization ([place]) 07/12/2019  ? GERD (gastroesophageal reflux disease) 02/06/2011  ? HFrEF (heart failure with reduced ejection fraction) ([place])   ? a. EF 20% in 2014; b. 08/2017 Echo: EF 20-25%, diff HK, Gr3 DD. Triv AI. Mod MR. Sev dil LA. Mildly dil RV w/ mildly reduced RV fxn. Mildly dil RA. Mod TR. PASP 39mg. // Echo 3/21: EF < 20, mild MR, mild AI, mod LAE, mod reduced RVSF  ? HTN (hypertension)   ? Hyperlipidemia   ? Ischemic cardiomyopathy   ? a. EF 20% in 2014. (Master study EF >20%); b. 08/2017 Echo: EF 20-25%, diff HK. Gr3 DD.  ? LV (left ventricular) mural thrombus   ? a. 12/2012 Echo: EF 20% with mural thrombus No evidence of thrombus on 08/2017 echo.  ? Macular degeneration   ? Myocardial infarct ([facility]   ? 2003  ? PAF (paroxysmal atrial fibrillation) ([facility]   ? a. CHA2DS2VASc = 5-->Xarelto/Tikosyn.  ? Ventricular tachycardia 11/05/2020  ? ?Past Surgical History:  ?Procedure Laterality Date  ? CARDIAC CATHETERIZATION N/A 09/20/2014  ? Procedure: Left Heart Cath and Coronary Angiography;  Surgeon: JJettie Booze MD; LAD 95%, D1 100%, CFX liner percent, OM 200%, OM 390%, RCA 90%, LIMA-LAD okay, SVG-OM 2-OM 3 minimal disease, SVG-RPDA-RPLB 100% between the RPDA and RPL     ? CARDIAC CATHETERIZATION N/A 09/20/2014  ?  Procedure: Coronary Stent Intervention;  Surgeon: JJettie Booze MD; Synergy DES 4 x 24 mm reducing the stenosis to 5%   ? CARDIAC DEFIBRILLATOR PLACEMENT  01/17/03  ? [phone]. Medtronic. remote-no; with later revision  ? CARDIOVERSION N/A 05/22/2016  ? Procedure: CARDIOVERSION;  Surgeon: BLelon Perla MD;  [facility]  Service: Cardiovascular;  Laterality: N/A;  ? CARDIOVERSION N/A 08/10/2017  ? Procedure: CARDIOVERSION;  Surgeon: HPixie Casino MD;  [facility];  Service: Cardiovascular;  Laterality: N/A;  ? CATARACT EXTRACTION W/ INTRAOCULAR LENS  IMPLANT, BILATERAL Bilateral June/-July 2009  ? Dr. GKaty Fitch ? CATARACT EXTRACTION W/PHACO Bilateral 2010  ? Dr. GKaty Fitch ? COLON RESECTION  02/09/2011  ?  Procedure: LAPAROSCOPIC SIGMOID COLON RESECTION;  Surgeon: Pedro Earls, MD;  Location: [facility];  Service: General;  Laterality: N/A;  Laparoscopic Assisted Sigmoid Colectomy  ? COLON SURGERY    ? COLONOSCOPY  08/31/2011  ? Procedure: COLONOSCOPY;  Surgeon: Jerene Bears, MD;  [facility];  Service: Gastroenterology;  Laterality: N/A;  ? COLONOSCOPY N/A 09/05/2012  ? Procedure: COLONOSCOPY;  Surgeon: Jerene Bears, MD;  Location: [facility];  Service: Gastroenterology;  Laterality: N/A;  ? COLONOSCOPY N/A 04/18/2013  ? Procedure: COLONOSCOPY;  Surgeon: Jerene Bears, MD;  Location[facility];  Service: Gastroenterology;  Laterality: N/A;  ? COLONOSCOPY N/A 04/09/2014  ? Procedure: COLONOSCOPY;  Surgeon: Jerene Bears, MD;  Location: [facility];  Service: Gastroenterology;  Laterality: N/A;  ? COLONOSCOPY WITH PROPOFOL N/A 05/03/2017  ? Procedure: COLONOSCOPY WITH PROPOFOL;  Surgeon: Jerene Bears, MD;  Location: [facility];  Service: Gastroenterology;  Laterality: N/A;  ? CORONARY ANGIOPLASTY    ? CORONARY ARTERY BYPASS GRAFT  01/2002  ? LIMA-LAD, SVG-OM 2-OM 3, SVG-RPDA-RPLB  ? CORONARY STENT INTERVENTION N/A 11/22/2018  ? Procedure: CORONARY STENT INTERVENTION;  Surgeon:  Nelva Bush, MD;  Location: [facility];  Service: Cardiovascular;  Laterality: N/A;  SVG - RCA  ? ICD GENERATOR CHANGE  2010  ? Medtronic Virtuoso II VR ICD  ? ICD GENERATOR CHANGEOUT N/A 12/07/2018

## 2021-06-23 NOTE — Patient Instructions (Signed)
?Mr. Holleman , ?Thank you for taking time to complete your Medicare Wellness Visit. I appreciate your ongoing commitment to your health goals. Please review the following plan we discussed and let me know if I can assist you in the future.  ? ?Screening recommendations/referrals: ?Colonoscopy: Completed 05/03/2017 ?Recommended yearly ophthalmology/optometry visit for glaucoma screening and checkup ?Recommended yearly dental visit for hygiene and checkup ? ?Vaccinations: ?Influenza vaccine: Up to date ?Pneumococcal vaccine: Up to date ?Tdap vaccine: Due-May obtain vaccine your local pharmacy. ?Shingles vaccine:   Due-May obtain vaccine your local pharmacy. ?Covid-19: Up to date ? ?Advanced directives: Please bring a copy of Living Will and/or [place] for your chart if available. ? ? ?Conditions/risks identified: See problem list ? ?Next appointment: Follow up in one year for your annual wellness visit.  ? ?Preventive Care 31 Years and Older, Male ?Preventive care refers to lifestyle choices and visits with your health care provider that can promote health and wellness. ?What does preventive care include? ?A yearly physical exam. This is also called an annual well check. ?Dental exams once or twice a year. ?Routine eye exams. Ask your health care provider how often you should have your eyes checked. ?Personal lifestyle choices, including: ?Daily care of your teeth and gums. ?Regular physical activity. ?Eating a healthy diet. ?Avoiding tobacco and drug use. ?Limiting alcohol use. ?Practicing safe sex. ?Taking low doses of aspirin every day. ?Taking vitamin and mineral supplements as recommended by your health care provider. ?What happens during an annual well check? ?The services and screenings done by your health care provider during your annual well check will depend on your age, overall health, lifestyle risk factors, and family history of disease. ?Counseling  ?Your health care provider may ask  you questions about your: ?Alcohol use. ?Tobacco use. ?Drug use. ?Emotional well-being. ?Home and relationship well-being. ?Sexual activity. ?Eating habits. ?History of falls. ?Memory and ability to understand (cognition). ?Work and work Statistician. ?Screening  ?You may have the following tests or measurements: ?Height, weight, and BMI. ?Blood pressure. ?Lipid and cholesterol levels. These may be checked every 5 years, or more frequently if you are over 45 years old. ?Skin check. ?Lung cancer screening. You may have this screening every year starting at age 84 if you have a 30-pack-year history of smoking and currently smoke or have quit within the past 15 years. ?Fecal occult blood test (FOBT) of the stool. You may have this test every year starting at age 39. ?Flexible sigmoidoscopy or colonoscopy. You may have a sigmoidoscopy every 5 years or a colonoscopy every 10 years starting at age 68. ?Prostate cancer screening. Recommendations will vary depending on your family history and other risks. ?Hepatitis C blood test. ?Hepatitis B blood test. ?Sexually transmitted disease (STD) testing. ?Diabetes screening. This is done by checking your blood sugar (glucose) after you have not eaten for a while (fasting). You may have this done every 1-3 years. ?Abdominal aortic aneurysm (AAA) screening. You may need this if you are a current or former smoker. ?Osteoporosis. You may be screened starting at age 52 if you are at high risk. ?Talk with your health care provider about your test results, treatment options, and if necessary, the need for more tests. ?Vaccines  ?Your health care provider may recommend certain vaccines, such as: ?Influenza vaccine. This is recommended every year. ?Tetanus, diphtheria, and acellular pertussis (Tdap, Td) vaccine. You may need a Td booster every 10 years. ?Zoster vaccine. You may need this after age 19. ?  Pneumococcal 13-valent conjugate (PCV13) vaccine. One dose is recommended after age  56. ?Pneumococcal polysaccharide (PPSV23) vaccine. One dose is recommended after age 3. ?Talk to your health care provider about which screenings and vaccines you need and how often you need them. ?This information is not intended to replace advice given to you by your health care provider. Make sure you discuss any questions you have with your health care provider. ?Document Released: 04/12/2015 Document Revised: 12/04/2015 Document Reviewed: 01/15/2015 ?Elsevier Interactive Patient Education ? 2017 [place]. ? ?Fall Prevention in the Home ?Falls can cause injuries. They can happen to people of all ages. There are many things you can do to make your home safe and to help prevent falls. ?What can I do on the outside of my home? ?Regularly fix the edges of walkways and driveways and fix any cracks. ?Remove anything that might make you trip as you walk through a door, such as a raised step or threshold. ?Trim any bushes or trees on the path to your home. ?Use bright outdoor lighting. ?Clear any walking paths of anything that might make someone trip, such as rocks or tools. ?Regularly check to see if handrails are loose or broken. Make sure that both sides of any steps have handrails. ?Any raised decks and porches should have guardrails on the edges. ?Have any leaves, snow, or ice cleared regularly. ?Use sand or salt on walking paths during winter. ?Clean up any spills in your garage right away. This includes oil or grease spills. ?What can I do in the bathroom? ?Use night lights. ?Install grab bars by the toilet and in the tub and shower. Do not use towel bars as grab bars. ?Use non-skid mats or decals in the tub or shower. ?If you need to sit down in the shower, use a plastic, non-slip stool. ?Keep the floor dry. Clean up any water that spills on the floor as soon as it happens. ?Remove soap buildup in the tub or shower regularly. ?Attach bath mats securely with double-sided non-slip rug tape. ?Do not have throw  rugs and other things on the floor that can make you trip. ?What can I do in the bedroom? ?Use night lights. ?Make sure that you have a light by your bed that is easy to reach. ?Do not use any sheets or blankets that are too big for your bed. They should not hang down onto the floor. ?Have a firm chair that has side arms. You can use this for support while you get dressed. ?Do not have throw rugs and other things on the floor that can make you trip. ?What can I do in the kitchen? ?Clean up any spills right away. ?Avoid walking on wet floors. ?Keep items that you use a lot in easy-to-reach places. ?If you need to reach something above you, use a strong step stool that has a grab bar. ?Keep electrical cords out of the way. ?Do not use floor polish or wax that makes floors slippery. If you must use wax, use non-skid floor wax. ?Do not have throw rugs and other things on the floor that can make you trip. ?What can I do with my stairs? ?Do not leave any items on the stairs. ?Make sure that there are handrails on both sides of the stairs and use them. Fix handrails that are broken or loose. Make sure that handrails are as long as the stairways. ?Check any carpeting to make sure that it is firmly attached to the stairs. Fix any  carpet that is loose or worn. ?Avoid having throw rugs at the top or bottom of the stairs. If you do have throw rugs, attach them to the floor with carpet tape. ?Make sure that you have a light switch at the top of the stairs and the bottom of the stairs. If you do not have them, ask someone to add them for you. ?What else can I do to help prevent falls? ?Wear shoes that: ?Do not have high heels. ?Have rubber bottoms. ?Are comfortable and fit you well. ?Are closed at the toe. Do not wear sandals. ?If you use a stepladder: ?Make sure that it is fully opened. Do not climb a closed stepladder. ?Make sure that both sides of the stepladder are locked into place. ?Ask someone to hold it for you, if  possible. ?Clearly mark and make sure that you can see: ?Any grab bars or handrails. ?First and last steps. ?Where the edge of each step is. ?Use tools that help you move around (mobility aids) if they are neede

## 2021-07-01 ENCOUNTER — Encounter: Payer: Self-pay | Admitting: Internal Medicine

## 2021-07-01 MED ORDER — NITROFURANTOIN MONOHYD MACRO 100 MG PO CAPS: 100.0000 mg | ORAL_CAPSULE | Freq: Two times a day (BID) | ORAL | 0 refills | Status: AC

## 2021-07-02 ENCOUNTER — Other Ambulatory Visit: Payer: Self-pay | Admitting: Internal Medicine

## 2021-07-03 ENCOUNTER — Ambulatory Visit (INDEPENDENT_AMBULATORY_CARE_PROVIDER_SITE_OTHER): Payer: Medicare Other | Admitting: Internal Medicine

## 2021-07-03 ENCOUNTER — Encounter: Payer: Self-pay | Admitting: Cardiology

## 2021-07-03 ENCOUNTER — Ambulatory Visit: Payer: Medicare Other | Admitting: Cardiology

## 2021-07-03 DIAGNOSIS — I5042 Chronic combined systolic (congestive) and diastolic (congestive) heart failure: Secondary | ICD-10-CM | POA: Diagnosis not present

## 2021-07-03 DIAGNOSIS — I4819 Other persistent atrial fibrillation: Secondary | ICD-10-CM | POA: Diagnosis not present

## 2021-07-03 DIAGNOSIS — I5022 Chronic systolic (congestive) heart failure: Secondary | ICD-10-CM

## 2021-07-03 DIAGNOSIS — Z7189 Other specified counseling: Secondary | ICD-10-CM

## 2021-07-03 DIAGNOSIS — Z95810 Presence of automatic (implantable) cardiac defibrillator: Secondary | ICD-10-CM

## 2021-07-03 MED ORDER — ATORVASTATIN CALCIUM 20 MG PO TABS: 20.0000 mg | ORAL_TABLET | Freq: Every day | ORAL | 3 refills | Status: AC

## 2021-07-03 MED ORDER — TORSEMIDE 20 MG PO TABS: 20.0000 mg | ORAL_TABLET | Freq: Two times a day (BID) | ORAL | 3 refills | Status: AC

## 2021-07-03 NOTE — Patient Instructions (Signed)
Medication Instructions:  ?Please discontinue your carvedilol. ?Decrease your Atorvastatin to 20 mg a day. ?Decrease Torsemide to 20 mg twice a day. ?Continue all other medications as listed. ? ?*If you need a refill on your cardiac medications before your next appointment, please call your pharmacy* ? ?Follow-Up: ?At [facility], you and your health needs are our priority.  As part of our continuing mission to provide you with exceptional heart care, we have created designated Provider Care Teams.  These Care Teams include your primary Cardiologist (physician) and Advanced Practice Providers (APPs -  Physician Assistants and Nurse Practitioners) who all work together to provide you with the care you need, when you need it. ? ?We recommend signing up for the patient portal called "MyChart".  Sign up information is provided on this After Visit Summary.  MyChart is used to connect with patients for Virtual Visits (Telemedicine).  Patients are able to view lab/test results, encounter notes, upcoming appointments, etc.  Non-urgent messages can be sent to your provider as well.   ?To learn more about what you can do with MyChart, go to [email or web address].   ? ?Your next appointment:   ?6 month(s) ? ?The format for your next appointment:   ?In Person ? ?Provider:   ?Nicholes Rough, PA-C, Melina Copa, PA-C, Ermalinda Barrios, PA-C, Christen Bame, NP, or Richardson Dopp, PA-C       ? ? ?Thank you for choosing [facility]!! ? ? ? ?

## 2021-07-03 NOTE — Assessment & Plan Note (Signed)
We are going ahead and stopping his Coreg 3.125 twice a day.  We also had lengthy discussion about his ICD, defibrillator/shock therapy.  He is currently DNR.  I discussed turning his shock therapy off.  His daughter is in agreement.  I spoke with Dr. Caryl Comes who graciously came in disable to shock therapies. ? ?Given his hypotension, Coreg is discontinuing. ?We will decrease his torsemide to 20 mg twice a day from 40 mg twice a day. ?

## 2021-07-03 NOTE — Progress Notes (Signed)
Asked by Dr. Marlou Porch to inactivate high-voltage therapies as the gentleman is now chosen DNR status. ? ?I sat with him and his daughter reviewing care his understanding of the implications of ICD inactivation.  I told him I appreciated having been able to be part of his care and I wished him peace as he left and with his leaving ? ?High-voltage therapies were inactivated.  I may have left his alarms on.  If so, we will have to remedy that ? ?23 min was spent in care of the patient including the review of records  ?

## 2021-07-03 NOTE — Assessment & Plan Note (Signed)
We will continue with Xarelto 15 mg.  Reduced dose.  He is not having any bleeding episodes.  His skin is thin however. ?

## 2021-07-03 NOTE — Assessment & Plan Note (Addendum)
Appreciate Dr. Sharlet Salina helping him with this.  We continued the conversation with his defibrillator.  We are disablingly shock portion of his defibrillator.  Able to still pace if necessary but this will not be painful.  DNR.  His daughter is in agreement. ?

## 2021-07-03 NOTE — Progress Notes (Signed)
?Cardiology Office Note:   ? ?Date:  07/03/2021  ? ?ID:  Mark Clayton, DOB July 25, 1940, MRN [id number] ? ?PCP:  [name], MD ?  ?[facility] Providers ?Cardiologist:  Candee Furbish, MD ?Electrophysiologist:  Virl Axe, MD  ?   ? ?Referring MD: [name], *  ? ? ?History of Present Illness:   ? ?Mark Clayton is a 81 y.o. male here for follow-up for Artrial Fibrillation, CHF, and HTN.  ? ?On 05/02/2021, the pt followed up with Dr. Shirley Friar for a cardiology appointment. He was generally stable with chronic edema and SOB with minimal activity. ? ?From a previous hospitalization, he was in skilled nursing facility. In summary, he was admitted with acute metabolic encephalopathy suspected from UTI and other medical causes.  He remained confused at discharge but back to baseline.  Has generalized deconditioning.  Stroke work-up was negative.  Neurology was following and signed off.  He had had EF less than 20 with chronic bilateral pleural effusions.  IV Lasix was given and changed to oral 60 mg daily.  He also has permanent atrial fibrillation that is rate controlled on carvedilol and carvedilol.  Hospital records reviewed ? ?He continues to have right-sided weakness debility.  Chronic systolic heart failure secondary to ischemic cardiomyopathy s/p CABG with ICD in place, persistent atrial fibrillation. Recently seen by Oda Kilts on 11/01/2020 had noted decline over the past several months.  Worsening of heart failure since April with OptiVol.   ? ?Last cath in 2020 see below ?Last ECHO 2021 during stroke, EF <20.  ? ?At the time of his last visit, he was in a somewhat confused state. He was taking 60 mg Lasix daily at that visit.  ? ?Today, ?Overall, he is doing fairly well. He is accompanied by a family member who is speaking on his behalf.  ? ?He has a a small amount of pain and is unsure how to answer if he has experience any cp. They also report he is sleeping better, and has  a little more confusion associated with a UTI that is being treated. ? ?His bp is 92/56 in clinic today..  ? ?Lately edema has not affected him and has improved. He has been on 40 mg Torsemide twice daily.  ? ?The pt is also has a DNR in place.  ? ?He denies any palpitations, chest pain, shortness of breath, or peripheral edema. No lightheadedness, headaches, syncope, orthopnea, or PND.  ? ?Past Medical History:  ?Diagnosis Date  ? AICD (automatic cardioverter/defibrillator) present 01/17/2003  ? Medtronic Maximo [id number] ICD, serial [id number]  ? Anemia 02/06/2011  ? takes oral iron  ? Arthritis   ? hands, knees  ? CAD (coronary artery disease) 2003  ? h/o MI and CABG in 2003. // s/p DES to [place] in 08/2014 // s/p DES to S-RPDA in 10/2018 (Cath w patent RCA stent, L-LAD and S-RI/OM2/OM3; jump graft from RPDA to RPL w CTO)  ? Cancer of sigmoid colon ([place]) 2012  ? a. s/p colon surgery.  ? Carotid bruit   ? Cough   ? Exudative age-related macular degeneration of left eye with active choroidal neovascularization ([place]) 07/12/2019  ? Exudative age-related macular degeneration, right eye, with inactive choroidal neovascularization ([place]) 07/12/2019  ? GERD (gastroesophageal reflux disease) 02/06/2011  ? HFrEF (heart failure with reduced ejection fraction) ([place])   ? a. EF 20% in 2014; b. 08/2017 Echo: EF 20-25%, diff HK, Gr3 DD. Triv AI. Mod MR. Sev  dil LA. Mildly dil RV w/ mildly reduced RV fxn. Mildly dil RA. Mod TR. PASP 18mg. // Echo 3/21: EF < 20, mild MR, mild AI, mod LAE, mod reduced RVSF  ? HTN (hypertension)   ? Hyperlipidemia   ? Ischemic cardiomyopathy   ? a. EF 20% in 2014. (Master study EF >20%); b. 08/2017 Echo: EF 20-25%, diff HK. Gr3 DD.  ? LV (left ventricular) mural thrombus   ? a. 12/2012 Echo: EF 20% with mural thrombus No evidence of thrombus on 08/2017 echo.  ? Macular degeneration   ? Myocardial infarct ([facility]   ? 2003  ? PAF (paroxysmal atrial fibrillation) ([place]   ? a. CHA2DS2VASc = 5-->Xarelto/Tikosyn.  ?  [place]ent[place]cardia [place] 11/05/2020  ? ? ?Past Surgical History:  ?Procedure Laterality Date  ? CARDIAC CATHETERIZATION N/A 09/20/2014  ? Procedure: Left Heart Cath and Coronary Angiography;  Surgeon: JJettie Booze MD; LAD 95%, D1 100%, CFX liner percent, OM 200%, OM 390%, RCA 90%, LIMA-LAD okay, SVG-OM 2-OM 3 minimal disease, SVG-RPDA-RPLB 100% between the RPDA and RPL     ? CARDIAC CATHETERIZATION N/A 09/20/2014  ? Procedure: Coronary Stent Intervention;  Surgeon: JJettie Booze MD; Synergy DES 4 x 24 mm reducing the stenosis to 5%   ? CARDIAC DEFIBRILLATOR PLACEMENT  01/17/03  ? [phone]. Medtronic. remote-no; with later revision  ? CARDIOVERSION N/A 05/22/2016  ? Procedure: CARDIOVERSION;  Surgeon: BLelon Perla MD;  [facility]  Service: Cardiovascular;  Laterality: N/A;  ? CARDIOVERSION N/A 08/10/2017  ? Procedure: CARDIOVERSION;  Surgeon: HPixie Casino MD;  [facility]  Service: Cardiovascular;  Laterality: N/A;  ? CATARACT EXTRACTION W/ INTRAOCULAR LENS  IMPLANT, BILATERAL Bilateral June/-July 2009  ? Dr. GKaty Fitch ? CATARACT EXTRACTION W/PHACO Bilateral 2010  ? Dr. GKaty Fitch ? COLON RESECTION  02/09/2011  ? Procedure: LAPAROSCOPIC SIGMOID COLON RESECTION;  Surgeon: MPedro Earls MD;  [facility];  Service: General;  Laterality: N/A;  Laparoscopic Assisted Sigmoid Colectomy  ? COLON SURGERY    ? COLONOSCOPY  08/31/2011  ? Procedure: COLONOSCOPY;  Surgeon: JJerene Bears MD;  [facility];  Service: Gastroenterology;  Laterality: N/A;  ? COLONOSCOPY N/A 09/05/2012  ? Procedure: COLONOSCOPY;  Surgeon: JJerene Bears MD;  [facility];  Service: Gastroenterology;  Laterality: N/A;  ? COLONOSCOPY N/A 04/18/2013  ? Procedure: COLONOSCOPY;  Surgeon: JJerene Bears MD;  [facility];  Service: Gastroenterology;  Laterality: N/A;  ? COLONOSCOPY N/A 04/09/2014  ? Procedure: COLONOSCOPY;  Surgeon: JJerene Bears MD;  [facility];   Service: Gastroenterology;  Laterality: N/A;  ? COLONOSCOPY WITH PROPOFOL N/A 05/03/2017  ? Procedure: COLONOSCOPY WITH PROPOFOL;  Surgeon: PJerene Bears MD;  [facility];  Service: Gastroenterology;  Laterality: N/A;  ? CORONARY ANGIOPLASTY    ? CORONARY ARTERY BYPASS GRAFT  01/2002  ? LIMA-LAD, SVG-OM 2-OM 3, SVG-RPDA-RPLB  ? CORONARY STENT INTERVENTION N/A 11/22/2018  ? Procedure: CORONARY STENT INTERVENTION;  Surgeon: ENelva Bush MD;  [facility];  Service: Cardiovascular;  Laterality: N/A;  SVG - RCA  ? ICD GENERATOR CHANGE  2010  ? Medtronic Virtuoso II VR ICD  ? ICD GENERATOR CHANGEOUT N/A 12/07/2018  ? Procedure: ICD GENERATOR CHANGEOUT;  Surgeon: KDeboraha Sprang MD;  [facility];  Service: Cardiovascular;  Laterality: N/A;  ? INGUINAL HERNIA REPAIR Right 2000's X 2  ? LAPAROSCOPIC RIGHT HEMI COLECTOMY N/A 11/04/2012  ?  Procedure: LAPAROSCOPIC RIGHT HEMI COLECTOMY;  Surgeon: Pedro Earls, MD;  [facility];  Service: General;  Laterality: N/A;  ? LEFT HEART CATH AND CORS/GRAFTS ANGIOGRAPHY Left 11/22/2018  ? Procedure: LEFT HEART CATH AND CORS/GRAFTS ANGIOGRAPHY;  Surgeon: Nelva Bush, MD;  Location: [place] [facility];  Service: Cardiovascular;  Laterality: Left;  ? TONSILLECTOMY  ~ 1950  ? ? ?Current Medications: ?Current Meds  ?Medication Sig  ? acetaminophen (TYLENOL) 500 MG tablet Take 500 mg by mouth every 6 (six) hours as needed for moderate pain or headache.  ? bisacodyl (DULCOLAX) 10 MG suppository Place 10 mg rectally See admin instructions. Qd on Monday and Thursday as needed for constipation  ? divalproex (DEPAKOTE ER) 250 MG 24 hr tablet Take 2 tablets every night  ? ferrous sulfate 325 (65 FE) MG tablet Take 1 tablet (325 mg total) by mouth 2 (two) times daily with a meal. (Patient taking differently: Take 325 mg by mouth daily with breakfast.)  ? meclizine (ANTIVERT) 25 MG tablet Take 12.5 mg by mouth daily as needed for dizziness.   ? nitrofurantoin, macrocrystal-monohydrate, (MACROBID) 100 MG capsule Take 1 capsule (100 mg total) by mouth 2 (two) times daily.  ? nitroGLYCERIN (NITROSTAT) 0.4 MG SL tablet DISSOLVE 1 TABLET UNDER THE

## 2021-07-04 ENCOUNTER — Telehealth: Payer: Medicare Other

## 2021-07-10 ENCOUNTER — Encounter: Payer: Self-pay | Admitting: Cardiology

## 2021-07-10 ENCOUNTER — Encounter: Payer: Self-pay | Admitting: Internal Medicine

## 2021-07-14 ENCOUNTER — Ambulatory Visit (INDEPENDENT_AMBULATORY_CARE_PROVIDER_SITE_OTHER): Payer: Medicare Other

## 2021-07-14 DIAGNOSIS — Z8673 Personal history of transient ischemic attack (TIA), and cerebral infarction without residual deficits: Secondary | ICD-10-CM | POA: Diagnosis not present

## 2021-07-14 DIAGNOSIS — I5023 Acute on chronic systolic (congestive) heart failure: Secondary | ICD-10-CM | POA: Diagnosis not present

## 2021-07-15 ENCOUNTER — Encounter: Payer: Self-pay | Admitting: Cardiology

## 2021-07-16 NOTE — Progress Notes (Signed)
See attachements for documentation of VT therapy alarms turned off for VT on device and home monitor. Therapies previously discontinue by Dr. Caryl Comes. ? ? ?

## 2021-07-17 ENCOUNTER — Telehealth: Payer: Self-pay | Admitting: *Deleted

## 2021-07-17 DIAGNOSIS — I5022 Chronic systolic (congestive) heart failure: Secondary | ICD-10-CM

## 2021-07-17 DIAGNOSIS — I25119 Atherosclerotic heart disease of native coronary artery with unspecified angina pectoris: Secondary | ICD-10-CM

## 2021-07-17 DIAGNOSIS — I4819 Other persistent atrial fibrillation: Secondary | ICD-10-CM

## 2021-07-17 NOTE — Telephone Encounter (Signed)
Per Dr Marlou Porch - OK to order palliative care for pt at pt/family's request. ? ?Order place for [facility] - they will contact pt/family ?

## 2021-07-18 ENCOUNTER — Telehealth: Payer: Self-pay

## 2021-07-18 NOTE — Telephone Encounter (Signed)
Spoke with patient's daughter Lake Bells and scheduled a Mychart Palliative Consult for 07/28/21 @ 2:30 PM.  ? ?Consent obtained; updated Netsmart, Team List and Epic. ?

## 2021-07-23 ENCOUNTER — Ambulatory Visit (INDEPENDENT_AMBULATORY_CARE_PROVIDER_SITE_OTHER): Payer: Medicare Other

## 2021-07-23 DIAGNOSIS — I255 Ischemic cardiomyopathy: Secondary | ICD-10-CM

## 2021-07-24 LAB — CUP PACEART REMOTE DEVICE CHECK
Battery Remaining Longevity: 111 mo
Battery Voltage: 2.94 V
Brady Statistic RV Percent Paced: 0.01 %
Date Time Interrogation Session: 20230427112955
HighPow Impedance: 41 Ohm
HighPow Impedance: 53 Ohm
Implantable Lead Implant Date: 20041020
Implantable Lead Implant Date: 20100913
Implantable Lead Location: 753860
Implantable Lead Location: 753860
Implantable Lead Model: 5076
Implantable Lead Model: 6949
Implantable Pulse Generator Implant Date: 20200909
Lead Channel Impedance Value: 304 Ohm
Lead Channel Impedance Value: 418 Ohm
Lead Channel Pacing Threshold Amplitude: 2.125 V
Lead Channel Pacing Threshold Pulse Width: 0.4 ms
Lead Channel Sensing Intrinsic Amplitude: 6.625 mV
Lead Channel Sensing Intrinsic Amplitude: 6.625 mV
Lead Channel Setting Pacing Amplitude: 2.5 V
Lead Channel Setting Pacing Pulse Width: 0.8 ms
Lead Channel Setting Sensing Sensitivity: 0.45 mV

## 2021-07-26 ENCOUNTER — Other Ambulatory Visit: Payer: Self-pay | Admitting: Internal Medicine

## 2021-07-28 ENCOUNTER — Telehealth: Payer: Self-pay | Admitting: Hospice

## 2021-07-28 DIAGNOSIS — I5022 Chronic systolic (congestive) heart failure: Secondary | ICD-10-CM

## 2021-07-28 DIAGNOSIS — G40909 Epilepsy, unspecified, not intractable, without status epilepticus: Secondary | ICD-10-CM

## 2021-07-28 DIAGNOSIS — F0150 Vascular dementia without behavioral disturbance: Secondary | ICD-10-CM

## 2021-07-28 DIAGNOSIS — E43 Unspecified severe protein-calorie malnutrition: Secondary | ICD-10-CM

## 2021-07-28 DIAGNOSIS — I69359 Hemiplegia and hemiparesis following cerebral infarction affecting unspecified side: Secondary | ICD-10-CM

## 2021-07-28 DIAGNOSIS — Z515 Encounter for palliative care: Secondary | ICD-10-CM

## 2021-07-28 NOTE — Progress Notes (Signed)
Therapist, nutritional Palliative Care Consult Note Telephone: [phone]  Fax: [phone]  PATIENT NAME: Mark Clayton [address] [place] [address] [phone] (home)  DOB: Feb 24, 1941 MRN: [id number]  PRIMARY CARE PROVIDER:    Myrlene Broker, MD,  [address] [place] [address] [phone]  REFERRING PROVIDER:   Myrlene Broker, MD [address] [place],  [place] [address] [phone]  RESPONSIBLE PARTY:   Steva Ready Information     Name Relation Home Work Mobile   Long,Melissa "[place]" Daughter   [phone]   Rito Ehrlich   [phone]   LEWIS,DONIELLE Daughter   [phone]       TELEHEALTH VISIT STATEMENT Due to the COVID-19 crisis, this visit was done via telemedicine from my office and it was initiated and consent by this patient and or family.  I connected with patient OR PROXY by a telephone/video  and verified that I am speaking with the correct person. I discussed the limitations of evaluation and management by telemedicine. The patient expressed understanding and agreed to proceed. Palliative Care was asked to follow this patient to address advance care planning, complex medical decision making and goals of care clarification. Melissa, and Donnielle are with patient during visit.  Independent history from Melissa because patient is an unreliable historian due to dementia.  This is the initial visit.     ASSESSMENT AND / RECOMMENDATIONS:   Advance Care Planning: Our advance care planning conversation included a discussion about:    The value and importance of advance care planning  Difference between Hospice and Palliative care Exploration of goals of care in the event of a sudden injury or illness  Identification and preparation of a healthcare agent  Review and updating or creation of an  advance directive document . Decision not to resuscitate or to de-escalate disease focused  treatments due to poor prognosis.  CODE STATUS: Discussion on the implications of code status. Patient is a Do Not Resuscitate. Family is interested in hospice service in the future when patient qualifies for it.   Goals of Care: Goals include to maximize quality of life and symptom management  I spent  16 minutes providing this initial consultation. More than 50% of the time in this consultation was spent on counseling patient and coordinating communication. --------------------------------------------------------------------------------------------------------------------------------------  Symptom Management/Plan: CHF: Continue torsemide as ordered.  Elevate extremities to help promote circulation.  Adhere to salt/fluid limits.  Report weight gain of 2 pounds in a day or 5 pounds in a week.  Follow up with cardiologist as planned. Protein Caloric Malnutrition: Weight 102 Ib Height: 5 feet 8 inches, patient was 165 Ibs 2 years ago; he was 126 Ibs a month ago.  Routine CBC CMP. Afib: Managed with Xarelto. Pacemaker in place but non functional.  R side Hemiplegia/hemiparesis: related to stroke 2 years. Patient is non ambulatory, max assist for transfers. Vascular Dementia: Ongoing Memory loss/confusion.  Limited language, impoverished thought, incontinent of bowel and bladder, nonambulatory, FAST 7A Seizures: Managed with Deparkote.  Seizure precautions in place Patient is followed by Neurologist Insomnia: Managed with Trazodone  Follow up: Palliative care will continue to follow for complex medical decision making, advance care planning, and clarification of goals. Return 6 weeks or prn. Encouraged to call provider sooner with any concerns.   Family /Caregiver/Community Supports: Patient lives at home with his grand daughter.  Patient has caregivers 40 hours a week. Strong family support system idenfified. [name] and Donielle visit often.  Donielle manages his medications. Strong family support  system identified.   HOSPICE ELIGIBILITY/DIAGNOSIS: TBD  Chief Complaint: Initial Palliative care visit  HISTORY OF PRESENT ILLNESS:  Mark Clayton is a 81 y.o. year old male  with multiple morbidities requiring close monitoring and with high risk of complications and  mortality: Congestive heart failure, seizure disorder, A-fib, CVA, hemiplegia/hemiparesis.  Personal of colon cancer.  Patient in no acute distress, no pain/discomfort, FLACC 0. History obtained from review of EMR, discussion with primary team, caregiver, family and/or Mr. Board.  Review and summarization of Epic records shows history from other than patient. Rest of 10 point ROS asked and negative.  Independent interpretation of tests and reviewed as needed, available labs, patient records, imaging, studies and related documents from the EMR.    ROS General: NAD EYES: denies vision changes ENMT: denies dysphagia Cardiovascular: denies chest pain/discomfort Pulmonary: denies cough, denies SOB Abdomen: endorses good appetite, denies constipation/diarrhea GU: denies dysuria, urinary frequency MSK:  endorses weakness,  no falls reported, nonambulatory Skin: denies rashes or wounds Neurological: denies pain, denies insomnia Heme/lymph/immuno: denies bruises, abnormal bleeding   PAST MEDICAL HISTORY:  Active Ambulatory Problems    Diagnosis Date Noted   Hyperlipidemia 12/06/2008   Chronic systolic heart failure (HCC) 11/26/2008   Cardiac defibrillator  MDT VVI 12/26/2010   Microcytic anemia 01/12/2011   Implantable cardioverter-defibrillator (ICD) in situ 03/09/2012   Personal history of colon cancer    Ischemic cardiomyopathy    Goals of care, counseling/discussion 01/10/2016   Persistent atrial fibrillation    Coronary artery disease involving native heart with angina pectoris (HCC) 10/19/2017   Essential hypertension 01/21/2018   Tinnitus 08/26/2018   Dysarthria    Hx of completed stroke 06/14/2019    Exudative age-related macular degeneration of right eye with active choroidal neovascularization (HCC) 07/12/2019   Intermediate stage nonexudative age-related macular degeneration of left eye 07/12/2019   Exudative age-related macular degeneration of left eye with inactive choroidal neovascularization (HCC) 02/13/2020   Bradycardia 04/14/2020   Low urine output 07/05/2020   Hypotension 07/05/2020   Hemiplegia of right dominant side as late effect of cerebral infarction (HCC) 07/19/2020   Anxiety 08/14/2020   Focal neurological deficit, onset greater than 24 hours 09/27/2020   Hallucination 09/27/2020   Pre-diabetes 09/27/2020   Bilateral inguinal hernia 10/11/2020   Chronic cough 10/24/2020   Chronic anticoagulation 11/05/2020   Ventricular tachycardia (HCC) 11/05/2020   Pleural effusion due to CHF (congestive heart failure) (HCC) 11/13/2020   Delirium 11/26/2020   Acute on chronic systolic CHF (congestive heart failure) (HCC) 02/08/2021   CHF (congestive heart failure) (HCC) 02/08/2021   COVID-19 05/12/2021   Resolved Ambulatory Problems    Diagnosis Date Noted   Dehydration 04/29/2009   Aortic valve disorder 04/29/2009   LEG CRAMPS 05/10/2009   Chest pain 12/26/2010   ischemic cardiomyopathy s/p CABG  12/26/2010   Adenocarcinoma of sigmoid colon Node positive 02/09/2011   Colon polyps 08/31/2011   ICD-Medtronic 09/02/2011   S/P right colectomy August 2014 11/14/2012   Apical mural thrombus following MI (HCC) 01/31/2013   History of colonic polyps    Benign neoplasm of descending colon    Unstable angina (HCC) 09/20/2014   Acute bronchitis 04/04/2016   SOB (shortness of breath) 05/27/2016   Idiopathic neuropathy 12/07/2016   Viral URI with cough 01/25/2017   Benign neoplasm of ascending colon    Chest pressure 07/20/2017   Visit for monitoring Tikosyn therapy 09/27/2017   Otitis externa 05/20/2018  ICH (intracerebral hemorrhage) (HCC) 06/06/2019   Acute lower UTI     Dysphagia, post-stroke    Hypokalemia    Acute on chronic combined systolic and diastolic congestive heart failure (HCC)    Exudative age-related macular degeneration of left eye with active choroidal neovascularization (HCC) 07/12/2019   Exudative age-related macular degeneration, right eye, with inactive choroidal neovascularization (HCC) 07/12/2019   Acute on chronic systolic congestive heart failure (HCC) 11/05/2020   Stroke-like symptoms 11/13/2020   Acute metabolic encephalopathy 11/14/2020   Acute cystitis without hematuria    Past Medical History:  Diagnosis Date   AICD (automatic cardioverter/defibrillator) present 01/17/2003   Anemia 02/06/2011   Arthritis    CAD (coronary artery disease) 2003   Cancer of sigmoid colon (HCC) 2012   Carotid bruit    Cough    GERD (gastroesophageal reflux disease) 02/06/2011   HFrEF (heart failure with reduced ejection fraction) (HCC)    HTN (hypertension)    LV (left ventricular) mural thrombus    Macular degeneration    Myocardial infarct (HCC)    PAF (paroxysmal atrial fibrillation) (HCC)     SOCIAL HX:  Social History   Tobacco Use   Smoking status: Never   Smokeless tobacco: Never  Substance Use Topics   Alcohol use: Not Currently    Alcohol/week: 2.0 standard drinks    Types: 2 Cans of beer per week     FAMILY HX:  Family History  Problem Relation Age of Onset   Hypertension Father    Hyperlipidemia Father    Heart disease Father    Prostate cancer Father    Alzheimer's disease Mother    Hypertension Sister    Hyperlipidemia Sister    Colon cancer Neg Hx    Esophageal cancer Neg Hx    Rectal cancer Neg Hx    Stomach cancer Neg Hx       ALLERGIES:  Allergies  Allergen Reactions   Buspar [Buspirone] Other (See Comments)    Fatigue, body aches and shortness of breath    Latex Rash   Tape Rash and Other (See Comments)    USE PAPER      PERTINENT MEDICATIONS:  Outpatient Encounter Medications as of 07/28/2021   Medication Sig   acetaminophen (TYLENOL) 500 MG tablet Take 500 mg by mouth every 6 (six) hours as needed for moderate pain or headache.   atorvastatin (LIPITOR) 20 MG tablet Take 1 tablet (20 mg total) by mouth daily.   bisacodyl (DULCOLAX) 10 MG suppository Place 10 mg rectally See admin instructions. Qd on Monday and Thursday as needed for constipation   divalproex (DEPAKOTE ER) 250 MG 24 hr tablet Take 2 tablets every night   ferrous sulfate 325 (65 FE) MG tablet Take 1 tablet (325 mg total) by mouth 2 (two) times daily with a meal. (Patient taking differently: Take 325 mg by mouth daily with breakfast.)   meclizine (ANTIVERT) 25 MG tablet Take 12.5 mg by mouth daily as needed for dizziness.   nitrofurantoin, macrocrystal-monohydrate, (MACROBID) 100 MG capsule Take 1 capsule (100 mg total) by mouth 2 (two) times daily.   nitroGLYCERIN (NITROSTAT) 0.4 MG SL tablet DISSOLVE 1 TABLET UNDER THE TONGUE EVERY 5 MINUTES AS  NEEDED FOR CHEST PAIN. MAX  OF 3 TABLETS IN 15 MINUTES. CALL 911 IF PAIN PERSISTS.   pantoprazole (PROTONIX) 40 MG tablet TAKE 1 TABLET BY MOUTH  DAILY   polyethylene glycol (MIRALAX / GLYCOLAX) 17 g packet Take 17 g by mouth daily as  needed for mild constipation.   Potassium Chloride ER 20 MEQ TBCR Take 1 tablet by mouth every other day.   sennosides-docusate sodium (SENOKOT-S) 8.6-50 MG tablet Take 2 tablets by mouth at bedtime.   torsemide (DEMADEX) 20 MG tablet Take 1 tablet (20 mg total) by mouth 2 (two) times daily.   traZODone (DESYREL) 50 MG tablet Take 3 tablets every night   vitamin B-12 (CYANOCOBALAMIN) 1000 MCG tablet Take 1,000 mcg by mouth daily.   XARELTO 15 MG TABS tablet TAKE 1 TABLET BY MOUTH DAILY  WITH SUPPER   No facility-administered encounter medications on file as of 07/28/2021.     Thank you for the opportunity to participate in the care of Mr. Vanduzer.  The palliative care team will continue to follow. Please call our office at [phone] if we can be  of additional assistance.   Note: Portions of this note were generated with Scientist, clinical (histocompatibility and immunogenetics). Dictation errors may occur despite best attempts at proofreading.  Rosaura Carpenter, NP

## 2021-08-07 NOTE — Progress Notes (Signed)
Remote ICD transmission.   

## 2021-08-08 ENCOUNTER — Telehealth: Payer: Self-pay | Admitting: Internal Medicine

## 2021-08-08 NOTE — Telephone Encounter (Signed)
Okay for verbal for hospice and can be attending if needed.  ?

## 2021-08-08 NOTE — Telephone Encounter (Signed)
A representative with [facility] calls today in regards to getting orders for a hospice evaluation. Order can be given verbally at ? ?CB: [phone] ? ?They also would like to know if Dr.Crawford would like to remain the primary provider for PT or if that responsibility would be transferred to one of their providers? ? ?Also FYI regarding PT he had recently went to hospital for a stroke and family had spoken to palliative care, but have decided to go ahead and introduce hospice. ?

## 2021-08-12 ENCOUNTER — Encounter: Payer: Self-pay | Admitting: Internal Medicine

## 2021-08-13 DIAGNOSIS — Z8673 Personal history of transient ischemic attack (TIA), and cerebral infarction without residual deficits: Secondary | ICD-10-CM | POA: Diagnosis not present

## 2021-08-13 DIAGNOSIS — I5023 Acute on chronic systolic (congestive) heart failure: Secondary | ICD-10-CM | POA: Diagnosis not present

## 2021-08-18 ENCOUNTER — Telehealth: Payer: Self-pay

## 2021-08-18 NOTE — Progress Notes (Unsigned)
Chronic Care Management Pharmacy Assistant   Name: Mark Clayton  MRN: [id number] DOB: [birth year]  Reason for Encounter: Disease State-General     Recent office visits:  07/28/21 Laverda Sorenson I, NP-Family Medicine (Palliative Care) Video visit  No orders or medication changes  05/12/21 [name], MD (Covid) Orders: none; Medication changes: molnupiravir EUA (LAGEVRIO) 200 mg CAPS capsule  04/30/21 Shirley Friar, PA-C (Chronic combined systolic and diastolic CHF) Orders: CBC<BMP; Medication changes: none  Recent consult visits:  07/03/21 Deboraha Sprang, MD-Cardiology (Automatic implantable cardioverter defibrillator) No orders or medication changes  07/03/21 Jerline Pain, MD-Cardiology (Chronic combined systolic and diastolic CHF) Orders: none; Medication changes: discontinue your carvedilol, Decrease  Atorvastatin to 20 mg daily and torsemide 20 mg BID  05/02/21 Cameron Sprang, MD-Neurology (Localization-related (focal) (partial) symptomatic epilepsy and epileptic syndromes with complex partial seizures, not intractable, without status epilepticus) Orders: none; Medication changes: none  Hospital visits:  None in previous 6 months  Medications: Outpatient Encounter Medications as of 08/18/2021  Medication Sig   acetaminophen (TYLENOL) 500 MG tablet Take 500 mg by mouth every 6 (six) hours as needed for moderate pain or headache.   atorvastatin (LIPITOR) 20 MG tablet Take 1 tablet (20 mg total) by mouth daily.   bisacodyl (DULCOLAX) 10 MG suppository Place 10 mg rectally See admin instructions. Qd on Monday and Thursday as needed for constipation   divalproex (DEPAKOTE ER) 250 MG 24 hr tablet Take 2 tablets every night   ferrous sulfate 325 (65 FE) MG tablet Take 1 tablet (325 mg total) by mouth 2 (two) times daily with a meal. (Patient taking differently: Take 325 mg by mouth daily with breakfast.)   meclizine (ANTIVERT) 25 MG tablet Take 12.5 mg by mouth daily  as needed for dizziness.   nitrofurantoin, macrocrystal-monohydrate, (MACROBID) 100 MG capsule Take 1 capsule (100 mg total) by mouth 2 (two) times daily.   nitroGLYCERIN (NITROSTAT) 0.4 MG SL tablet DISSOLVE 1 TABLET UNDER THE TONGUE EVERY 5 MINUTES AS  NEEDED FOR CHEST PAIN. MAX  OF 3 TABLETS IN 15 MINUTES. CALL 911 IF PAIN PERSISTS.   pantoprazole (PROTONIX) 40 MG tablet TAKE 1 TABLET BY MOUTH  DAILY   polyethylene glycol (MIRALAX / GLYCOLAX) 17 g packet Take 17 g by mouth daily as needed for mild constipation.   Potassium Chloride ER 20 MEQ TBCR Take 1 tablet by mouth every other day.   sennosides-docusate sodium (SENOKOT-S) 8.6-50 MG tablet Take 2 tablets by mouth at bedtime.   torsemide (DEMADEX) 20 MG tablet Take 1 tablet (20 mg total) by mouth 2 (two) times daily.   traZODone (DESYREL) 50 MG tablet Take 3 tablets every night   vitamin B-12 (CYANOCOBALAMIN) 1000 MCG tablet Take 1,000 mcg by mouth daily.   XARELTO 15 MG TABS tablet TAKE 1 TABLET BY MOUTH DAILY  WITH SUPPER   No facility-administered encounter medications on file as of 08/18/2021.   Contacted [name] for General Review Call   Chart Review:  Have there been any documented new, changed, or discontinued medications since last visit? Yes (If yes, include name, dose, frequency, date)07/03/21 Medication changes: discontinue your carvedilol, Decrease  Atorvastatin to 20 mg daily and torsemide 20 mg BID Has there been any documented recent hospitalizations or ED visits since last visit with Clinical Pharmacist? No Brief Summary (including medication and/or Diagnosis changes):   Adherence Review:  Does the Clinical Pharmacist Assistant have access to adherence rates? Yes Adherence rates  for STAR metric medications (List medication(s)/day supply/ last 2 fill dates). Adherence rates for medications indicated for disease state being reviewed (List medication(s)/day supply/ last 2 fill dates). Does the patient have >5 day  gap between last estimated fill dates for any of the above medications or other medication gaps? No Reason for medication gaps.   Disease State Questions:  Able to connect with Patient? {yes/no:20286}  Did patient have any problems with their health recently? {yes/no:20286} Note problems and Concerns:  Have you had any admissions or emergency room visits or worsening of your condition(s) since last visit? No, not since last coordination call 03/26/21  Have you had any visits with new specialists or providers since your last visit? Yes Explain:07/03/21 Deboraha Sprang, MD-Cardiology (Automatic implantable cardioverter defibrillator) 07/03/21 Jerline Pain, MD-Cardiology (Chronic combined systolic and diastolic CHF)   Have you had any new health care problem(s) since your last visit? {yes/no:20286} New problem(s) reported:  Have you run out of any of your medications since you last spoke with clinical pharmacist? {yes/no:20286} What caused you to run out of your medications?  Are there any medications you are not taking as prescribed? {yes/no:20286} What kept you from taking your medications as prescribed?  Are you having any issues or side effects with your medications? {yes/no:20286} Note of issues or side effects:  Do you have any other health concerns or questions you want to discuss with your Clinical Pharmacist before your next visit? {yes/no:20286} Note additional concerns and questions from Patient.  Are there any health concerns that you feel we can do a better job addressing? {yes/no:20286} Note Patient's response.  Are you having any problems with any of the following since the last visit: (select all that apply)  {General Call:27390}  Details:  12. Any falls since last visit? {yes/no:20286}  Details:  13. Any increased or uncontrolled pain since last visit? {yes/no:20286}  Details:  14. Next visit Type: {Telephone/Office:25179}       Visit with:        Date:         Time:  54. Additional Details? {yes/no:20286}    :Care Gaps Colonoscopy - 05/03/2017 Diabetic Foot Exam - NA Ophthalmology - NA Dexa Scan - NA Annual Well Visit - NA Micro albumin - NA Hemoglobin A1c - 11/14/20   Star Rating Drugs: Atorvastatin 80 mg 07/03/21 90 ds   [place] Pharmacist Assistant [phone]

## 2021-08-19 ENCOUNTER — Encounter: Payer: Self-pay | Admitting: Cardiology

## 2021-08-19 ENCOUNTER — Encounter (INDEPENDENT_AMBULATORY_CARE_PROVIDER_SITE_OTHER): Payer: Self-pay | Admitting: Ophthalmology

## 2021-08-19 ENCOUNTER — Encounter: Payer: Self-pay | Admitting: Internal Medicine

## 2021-08-19 ENCOUNTER — Encounter: Payer: Self-pay | Admitting: Neurology

## 2021-08-28 NOTE — Telephone Encounter (Signed)
Paperwork has been faxed. Conformation fax has been received.  ?

## 2021-08-28 DEATH — deceased

## 2021-09-03 NOTE — Telephone Encounter (Signed)
Called and spoke to his daughter

## 2021-09-12 ENCOUNTER — Other Ambulatory Visit: Payer: Self-pay | Admitting: Hospice

## 2021-10-31 ENCOUNTER — Ambulatory Visit: Payer: Medicare Other | Admitting: Neurology
